# Patient Record
Sex: Female | Born: 1945
Health system: Southern US, Community
[De-identification: ages and names within clinical notes are randomized; demographics above are authoritative.]

## PROBLEM LIST (undated history)

## (undated) DIAGNOSIS — I272 Pulmonary hypertension, unspecified: Secondary | ICD-10-CM

## (undated) DIAGNOSIS — I2510 Atherosclerotic heart disease of native coronary artery without angina pectoris: Secondary | ICD-10-CM

## (undated) DIAGNOSIS — I350 Nonrheumatic aortic (valve) stenosis: Secondary | ICD-10-CM

## (undated) DIAGNOSIS — I10 Essential (primary) hypertension: Secondary | ICD-10-CM

## (undated) DIAGNOSIS — Z8619 Personal history of other infectious and parasitic diseases: Secondary | ICD-10-CM

## (undated) DIAGNOSIS — J45909 Unspecified asthma, uncomplicated: Secondary | ICD-10-CM

## (undated) DIAGNOSIS — I428 Other cardiomyopathies: Secondary | ICD-10-CM

## (undated) DIAGNOSIS — E119 Type 2 diabetes mellitus without complications: Secondary | ICD-10-CM

## (undated) HISTORY — PX: OTHER SURGICAL HISTORY: SHX169

## (undated) HISTORY — PX: TUBAL LIGATION: SHX77

## (undated) HISTORY — DX: Other cardiomyopathies: I42.8

## (undated) HISTORY — DX: Nonrheumatic aortic (valve) stenosis: I35.0

## (undated) HISTORY — DX: Personal history of other infectious and parasitic diseases: Z86.19

## (undated) HISTORY — DX: Unspecified asthma, uncomplicated: J45.909

## (undated) HISTORY — DX: Atherosclerotic heart disease of native coronary artery without angina pectoris: I25.10

## (undated) HISTORY — DX: Type 2 diabetes mellitus without complications: E11.9

## (undated) HISTORY — DX: Pulmonary hypertension, unspecified: I27.20

## (undated) HISTORY — PX: CARDIAC CATHETERIZATION: SHX172

## (undated) HISTORY — DX: Essential (primary) hypertension: I10

---

## 2000-08-31 DIAGNOSIS — I10 Essential (primary) hypertension: Secondary | ICD-10-CM | POA: Insufficient documentation

## 2000-08-31 DIAGNOSIS — J4520 Mild intermittent asthma, uncomplicated: Secondary | ICD-10-CM | POA: Insufficient documentation

## 2002-09-22 DIAGNOSIS — Z6841 Body Mass Index (BMI) 40.0 and over, adult: Secondary | ICD-10-CM | POA: Insufficient documentation

## 2003-08-11 DIAGNOSIS — R609 Edema, unspecified: Secondary | ICD-10-CM | POA: Insufficient documentation

## 2004-02-04 ENCOUNTER — Ambulatory Visit: Payer: Self-pay | Admitting: Family Medicine

## 2006-04-02 DIAGNOSIS — E785 Hyperlipidemia, unspecified: Secondary | ICD-10-CM | POA: Insufficient documentation

## 2008-01-03 DIAGNOSIS — L304 Erythema intertrigo: Secondary | ICD-10-CM | POA: Insufficient documentation

## 2012-04-08 ENCOUNTER — Ambulatory Visit: Payer: Self-pay | Admitting: Family Medicine

## 2012-07-09 ENCOUNTER — Ambulatory Visit: Payer: Self-pay | Admitting: Gastroenterology

## 2012-07-09 LAB — HM COLONOSCOPY

## 2013-10-02 DEATH — deceased

## 2014-03-06 DIAGNOSIS — I10 Essential (primary) hypertension: Secondary | ICD-10-CM | POA: Diagnosis not present

## 2014-03-06 DIAGNOSIS — E559 Vitamin D deficiency, unspecified: Secondary | ICD-10-CM | POA: Diagnosis not present

## 2014-03-06 DIAGNOSIS — E785 Hyperlipidemia, unspecified: Secondary | ICD-10-CM | POA: Diagnosis not present

## 2014-03-06 DIAGNOSIS — E119 Type 2 diabetes mellitus without complications: Secondary | ICD-10-CM | POA: Diagnosis not present

## 2014-03-06 DIAGNOSIS — N390 Urinary tract infection, site not specified: Secondary | ICD-10-CM | POA: Diagnosis not present

## 2014-03-06 LAB — LIPID PANEL
CHOLESTEROL: 229 mg/dL — AB (ref 0–200)
LDL Cholesterol: 132 mg/dL
TRIGLYCERIDES: 77 mg/dL (ref 40–160)

## 2014-03-06 LAB — BASIC METABOLIC PANEL
BUN: 19 mg/dL (ref 4–21)
Creatinine: 0.8 mg/dL (ref <=1.1)
Glucose: 121 mg/dL
Potassium: 3.7 mmol/L (ref 3.4–5.3)
Sodium: 139 mmol/L (ref 137–147)

## 2014-03-06 LAB — HEMOGLOBIN A1C: Hgb A1c MFr Bld: 6.9 % — AB (ref 4.0–6.0)

## 2014-03-18 LAB — LIPID PANEL: HDL: 82 mg/dL — AB (ref 35–70)

## 2014-06-30 DIAGNOSIS — M8580 Other specified disorders of bone density and structure, unspecified site: Secondary | ICD-10-CM | POA: Insufficient documentation

## 2014-06-30 DIAGNOSIS — L723 Sebaceous cyst: Secondary | ICD-10-CM

## 2014-06-30 DIAGNOSIS — K648 Other hemorrhoids: Secondary | ICD-10-CM | POA: Insufficient documentation

## 2014-06-30 DIAGNOSIS — G4700 Insomnia, unspecified: Secondary | ICD-10-CM | POA: Insufficient documentation

## 2014-06-30 DIAGNOSIS — L089 Local infection of the skin and subcutaneous tissue, unspecified: Secondary | ICD-10-CM | POA: Insufficient documentation

## 2014-06-30 DIAGNOSIS — K120 Recurrent oral aphthae: Secondary | ICD-10-CM | POA: Insufficient documentation

## 2014-06-30 DIAGNOSIS — R195 Other fecal abnormalities: Secondary | ICD-10-CM | POA: Insufficient documentation

## 2014-07-01 ENCOUNTER — Encounter: Payer: Self-pay | Admitting: Family Medicine

## 2014-07-01 ENCOUNTER — Ambulatory Visit (INDEPENDENT_AMBULATORY_CARE_PROVIDER_SITE_OTHER): Payer: Medicare Other | Admitting: Family Medicine

## 2014-07-01 VITALS — BP 150/92 | HR 84 | Temp 97.7°F | Resp 16 | Ht 60.75 in | Wt 223.0 lb

## 2014-07-01 DIAGNOSIS — R609 Edema, unspecified: Secondary | ICD-10-CM | POA: Diagnosis not present

## 2014-07-01 DIAGNOSIS — I10 Essential (primary) hypertension: Secondary | ICD-10-CM | POA: Diagnosis not present

## 2014-07-01 DIAGNOSIS — E559 Vitamin D deficiency, unspecified: Secondary | ICD-10-CM | POA: Diagnosis not present

## 2014-07-01 DIAGNOSIS — R350 Frequency of micturition: Secondary | ICD-10-CM | POA: Diagnosis not present

## 2014-07-01 DIAGNOSIS — IMO0002 Reserved for concepts with insufficient information to code with codable children: Secondary | ICD-10-CM

## 2014-07-01 DIAGNOSIS — E785 Hyperlipidemia, unspecified: Secondary | ICD-10-CM

## 2014-07-01 DIAGNOSIS — Z0000 Encounter for general adult medical examination without abnormal findings: Secondary | ICD-10-CM | POA: Diagnosis not present

## 2014-07-01 DIAGNOSIS — J4520 Mild intermittent asthma, uncomplicated: Secondary | ICD-10-CM | POA: Diagnosis not present

## 2014-07-01 DIAGNOSIS — E1165 Type 2 diabetes mellitus with hyperglycemia: Secondary | ICD-10-CM | POA: Diagnosis not present

## 2014-07-01 DIAGNOSIS — N390 Urinary tract infection, site not specified: Secondary | ICD-10-CM

## 2014-07-01 DIAGNOSIS — L729 Follicular cyst of the skin and subcutaneous tissue, unspecified: Secondary | ICD-10-CM

## 2014-07-01 LAB — POCT UA - MICROALBUMIN: Microalbumin Ur, POC: 100 mg/L

## 2014-07-01 LAB — POCT URINALYSIS DIPSTICK
Bilirubin, UA: NEGATIVE
GLUCOSE UA: NEGATIVE
KETONES UA: NEGATIVE
NITRITE UA: NEGATIVE
PH UA: 6
Spec Grav, UA: 1.02
Urobilinogen, UA: 0.2

## 2014-07-01 MED ORDER — CIPROFLOXACIN HCL 500 MG PO TABS: 500.0000 mg | ORAL_TABLET | Freq: Two times a day (BID) | ORAL | Status: AC

## 2014-07-01 MED ORDER — FLUTICASONE PROPIONATE HFA 44 MCG/ACT IN AERO: 2.0000 | INHALATION_SPRAY | Freq: Two times a day (BID) | RESPIRATORY_TRACT | Status: DC

## 2014-07-01 MED ORDER — LISINOPRIL 20 MG PO TABS: 20.0000 mg | ORAL_TABLET | Freq: Every day | ORAL | Status: DC

## 2014-07-01 MED ORDER — LISINOPRIL 10 MG PO TABS: 10.0000 mg | ORAL_TABLET | Freq: Every day | ORAL | Status: DC

## 2014-07-01 NOTE — Patient Instructions (Addendum)
   Try OTC Biotene for dry mouth  Call if cyst is not much better in 1 week.  Start Flovent HFA inhaler, two puffs twice a day. You can still use rescue inhaler as needed, if you get short of breath  Increase lisinopril to 20mg  a day. A [facility] [address]

## 2014-07-01 NOTE — Progress Notes (Signed)
Patient: [name], Female    DOB: 02/13/1945, 69 y.o.   MRN: [id number] Visit Date: 07/01/2014  Today's Provider: Lelon Huh, MD   Chief Complaint  Patient presents with  . Medicare Wellness  . Hypertension    follow up  . Diabetes    follow up  . Hyperlipidemia   Subjective:    Annual exam visit [name] is a 69 y.o. female who presents today for her yearly physical  She feels fairly well. She reports exercising never. She reports she is sleeping poorly.  -----------------------------------------------------------  Diabetes Mellitus Type II, Follow-up:   Lab Results  Component Value Date   HGBA1C 6.9* 03/06/2014   Last seen for diabetes 3 months ago.  Management changes included none. She reports good compliance with treatment. She is not having side effects.  Current symptoms include polyuria and have been stable. Home blood sugar records: not being checked  Episodes of hypoglycemia? no   Current Insulin Regimen: none Most Recent Eye Exam: 1 year ago Weight trend: stable Prior visit with dietician: no Current diet: in general, an "unhealthy" diet Current exercise: none  ------------------------------------------------------------------------   Hypertension, follow-up:  BP Readings from Last 3 Encounters:  07/01/14 162/92  03/06/14 176/98    She was last seen for hypertension 3 months ago.  BP at that visit was 176/98. Management changes since that visit include doubling lisinopril to 2 x 5mg  tablets daily. She reports good compliance with treatment. She is not having side effects.  She is not exercising. She is not adherent to low salt diet.   Outside blood pressures are not being checked. She is experiencing none.  Patient denies chest pain, chest pressure/discomfort, claudication, dyspnea, exertional chest pressure/discomfort, fatigue, irregular heart beat, lower extremity edema, near-syncope, orthopnea, palpitations, paroxysmal  nocturnal dyspnea, syncope and tachypnea.   Cardiovascular risk factors include advanced age (older than 46 for men, 70 for women), diabetes mellitus, dyslipidemia, hypertension and obesity (BMI >= 30 kg/m2).  Use of agents associated with hypertension: none.   ------------------------------------------------------------------------    Lipid/Cholesterol, Follow-up:   Last seen for this 3 months ago.  Management changes since that visit include increasing Lovastatin to 20mg  daily.  Last Lipid Panel:    Component Value Date/Time   CHOL 229* 03/06/2014   TRIG 77 03/06/2014   HDL 82* 03/18/2014   LDLCALC 132 03/06/2014    She reports good compliance with treatment. She is not having side effects.   Wt Readings from Last 3 Encounters:  07/01/14 223 lb (101.152 kg)  03/06/14 216 lb (97.977 kg)    ------------------------------------------------------------------------  Vitamin D Deficency: Last office visit  was 03-04/2014. Changes made during this visit includes starting OTC Vitamin d3 1,000units daily. Patient  Has been taking Vitamin D since her last visit.   Asthma She states her breathing has been much worse the last several weeks, related to weather changes. Is having to use her rescue inhaler several times a day.   Cysts: She states she has had a tender knot on her buttocks for the several weeks and is slowly getting worse. No know injury. No drainage.     Review of Systems  Respiratory: Positive for shortness of breath and wheezing.   Gastrointestinal: Positive for nausea.  Genitourinary: Positive for frequency.  Musculoskeletal: Positive for back pain and arthralgias.  Skin: Positive for rash (lunp on right buttock).  Neurological: Positive for dizziness and headaches.  All other systems reviewed and are negative.  History   Social History  . Marital Status: Married    Spouse Name: N/A  . Number of Children: N/A  . Years of Education: N/A    Occupational History  . Not on file.   Social History Main Topics  . Smoking status: Never Smoker   . Smokeless tobacco: Not on file  . Alcohol Use: Yes     Comment: Occasional alcohol use  . Drug Use: No  . Sexual Activity: Not on file   Other Topics Concern  . Not on file   Social History Narrative    Patient Active Problem List   Diagnosis Date Noted  . Aphthae 06/30/2014  . Insomnia 06/30/2014  . Bleeding internal hemorrhoids 06/30/2014  . Dyssomnia 06/30/2014  . Osteopenia 06/30/2014  . Infected sebaceous cyst 06/30/2014  . Fecal occult blood test positive 06/30/2014  . Vitamin D deficiency 04/29/2009  . Diabetes mellitus type 2, uncontrolled 01/28/2009  . Eczema intertrigo 01/03/2008  . HLD (hyperlipidemia) 04/02/2006  . Accumulation of fluid in tissues 08/11/2003  . BMI 40.0-44.9, adult 09/22/2002  . Airway hyperreactivity 08/31/2000  . Essential (primary) hypertension 08/31/2000  . Leg varices 04/18/1999    Past Surgical History  Procedure Laterality Date  . Tubal ligation    . Cyst(solitary) of breast:removed      Her family history includes Cancer in her mother and sister; Diabetes in her brother, mother, and sister; Heart disease in her mother; Hypertension in her sister; Parkinson's disease in her father.    Previous Medications   ALBUTEROL (PROAIR HFA) 108 (90 BASE) MCG/ACT INHALER    Inhale into the lungs.   CALCIUM-VITAMIN D (OSCAL WITH D) 250-125 MG-UNIT PER TABLET    Take by mouth.   CHOLECALCIFEROL (VITAMIN D) 1000 UNITS TABLET    Take 1,000 Units by mouth daily.   CINNAMON 500 MG TABS    Take by mouth.   DOCUSATE SODIUM (STOOL SOFTENER) 100 MG CAPSULE    STOOL SOFTENER, 100MG  (Oral Tablet)  1 Every Day for 0 days  Quantity: 0.00;  Refills: 0   Ordered :06-Nov-2009  Meyer Cory ;  Started 29-Apr-2009 Active   FLUTICASONE-SALMETEROL (ADVAIR DISKUS) 250-50 MCG/DOSE AEPB    Inhale into the lungs.   KETOCONAZOLE (NIZORAL) 2 % CREAM     KETOCONAZOLE, 2% (External Cream)  1 (one) application Apply to affected area twice daily, as needed for 0 days  Quantity: 45;  Refills: 1   Ordered :09-May-2013  Sharen Hones ;  Started 09-May-2013 Active Comments: Medication taken as needed. DX: 695.89   LISINOPRIL (PRINIVIL,ZESTRIL) 5 MG TABLET    Take 2 by mouth every day   LOVASTATIN (MEVACOR) 20 MG TABLET    Take by mouth.   METFORMIN (GLUCOPHAGE-XR) 750 MG 24 HR TABLET    Take by mouth.   NAPROXEN SODIUM (ALEVE) 220 MG TABLET    Take by mouth.   NIFEDIPINE (PROCARDIA-XL/ADALAT CC) 30 MG 24 HR TABLET    Take by mouth.   TRAMADOL (ULTRAM) 50 MG TABLET    Take 50 mg by mouth every 8 (eight) hours as needed.   TRAZODONE (DESYREL) 100 MG TABLET    Take 100 mg by mouth at bedtime as needed.    Patient Care Team: Birdie Sons, MD as PCP - General (Family Medicine)     Objective:   Vitals: BP 162/92 mmHg  Pulse 84  Temp(Src) 97.7 F (36.5 C) (Oral)  Resp 16  Ht 5' 0.75" (1.543 m)  Wt 223  lb (101.152 kg)  BMI 42.49 kg/m2  SpO2 96%  Physical Exam   General Appearance:    Alert, cooperative, no distress, appears stated age, morbidly obese  Head:    Normocephalic, without obvious abnormality, atraumatic  Eyes:    PERRL, conjunctiva/corneas clear, EOM's intact, fundi    benign, both eyes  Ears:    Normal TM's and external ear canals, both ears  Nose:   Nares normal, septum midline, mucosa normal, no drainage    or sinus tenderness  Throat:   Lips, mucosa, and tongue normal; teeth and gums normal  Neck:   Supple, symmetrical, trachea midline, no adenopathy;    thyroid:  no enlargement/tenderness/nodules; no carotid   bruit or JVD  Back:     Symmetric, no curvature, ROM normal, no CVA tenderness  Lungs:     Clear to auscultation bilaterally, occasional mild expiratory wheeze, respirations unlabored  Chest Wall:    No tenderness or deformity   Heart:    Regular rate and rhythm, S1 and S2 normal, no murmur, rub   or gallop    Breast Exam:    normal appearance, no masses or tenderness, deferred  Abdomen:     Soft, non-tender, bowel sounds active all four quadrants,    no masses, no organomegaly  Pevic:    deferred  Extremities:   Extremities normal, atraumatic, no cyanosis 2+ bipedal edema  Pulses:   2+ and symmetric all extremities  Skin:   Skin color, texture, turgor normal, no rashes. Grape sized tender cyst   Lymph nodes:   Cervical, supraclavicular, and axillary nodes normal  Neurologic:   CNII-XII intact, normal strength, sensation and reflexes    throughout    Activities of Daily Living In your present state of health, do you have any difficulty performing the following activities: 07/01/2014  Hearing? N  Vision? N  Difficulty concentrating or making decisions? Y  Walking or climbing stairs? Y  Dressing or bathing? N  Doing errands, shopping? Y    Fall Risk Assessment Fall Risk  07/01/2014  Falls in the past year? Yes  Number falls in past yr: 1  Injury with Fall? No  Follow up Falls evaluation completed     Depression Screen PHQ 2/9 Scores 07/01/2014  PHQ - 2 Score 2  PHQ- 9 Score 3    Cognitive Testing - 6-CIT  Correct? Score   What year is it? yes 0 0 or 4  What month is it? yes 0 0 or 3  Memorize:    [place], [place],  [birth year],  [place],      What time is it? (within 1 hour) yes 0 0 or 3  Count backwards from 20 yes 0 0, 2, or 4  Name the months of the year yes 0 0, 2, or 4  Repeat name & address above no 8 0, 2, 4, 6, 8, or 10       TOTAL SCORE  8/28   Interpretation:  Abnormal- *  Normal (0-7) Abnormal (8-28)    Audit-C Alcohol Use Screening  Question Answer Points  How often do you have alcoholic drink? never 0  How many drinks do you typically consume in a day? 0 0  How oftey will you drink 6 or more in a total? never 0  Total Score:  0   A score of 3 or more in women, and 4 or more in men indicates increased risk for alcohol abuse, EXCEPT if all of  the points are  from question 1.     Assessment & Plan:     Annual Wellness Visit  Reviewed patient's Family Medical History Reviewed and updated list of patient's medical providers Assessment of cognitive impairment was done Assessed patient's functional ability Established a written schedule for health screening [place] Completed and Reviewed  Exercise Activities and Dietary recommendations Goals    None      Immunization History  Administered Date(s) Administered  . Pneumococcal Conjugate-13 05/09/2013  . Pneumococcal Polysaccharide-23 01/19/2011    Health Maintenance  Topic Date Due  . FOOT EXAM  05/12/1954  . OPHTHALMOLOGY EXAM  05/12/1954  . URINE MICROALBUMIN  05/12/1954  . TETANUS/TDAP  05/12/1963  . MAMMOGRAM  05/12/1994  . ZOSTAVAX  05/11/2004  . DEXA SCAN  05/11/2009  . INFLUENZA VACCINE  08/03/2014  . HEMOGLOBIN A1C  09/06/2014  . COLONOSCOPY  07/10/2022  . PNA vac Low Risk Adult  Completed      Discussed health benefits of physical activity, and encouraged her to engage in regular exercise appropriate for her age and condition.    ------------------------------------------------------------------------------------------------------------  1. Annual physical exam  - DG Bone Density; Future  2. Diabetes mellitus type 2, with nephropathy Due for A1c.  - Hemoglobin A1c - EKG 12-Lead - POCT UA - Microalbumin  3. Edema   4. Essential (primary) hypertension Improved since increasing lisinopril to 2 x 5mg , but not to goal.  - Increase lisinopril to 20mg  a day.  - Renal function panel  5. Vitamin D deficiency Tolerating D supplements - Vit D  25 hydroxy (rtn osteoporosis monitoring)  6. Frequent urination  - POCT urinalysis dipstick  7. Urinary tract infection without hematuria, site unspecified  - Urine culture - Cipro 500 BID for 7 dats.,   8. HLD (hyperlipidemia) Check lipids, on statin - Hepatic function panel - Lipid  panel - TSH  9. Cyst of buttocks Call if not improved after a week on cipro.   10. Uncontrolled asthma Start Fluticasone HFA 44, two puffs twice a day.   Follow u pin 1 month

## 2014-07-02 LAB — LIPID PANEL
CHOL/HDL RATIO: 2.4 ratio (ref 0.0–4.4)
CHOLESTEROL TOTAL: 193 mg/dL (ref 100–199)
HDL: 81 mg/dL (ref >39)
LDL CALC: 99 mg/dL (ref 0–99)
TRIGLYCERIDES: 64 mg/dL (ref 0–149)
VLDL Cholesterol Cal: 13 mg/dL (ref 5–40)

## 2014-07-02 LAB — RENAL FUNCTION PANEL
ALBUMIN: 4.1 g/dL (ref 3.5–4.8)
BUN/Creatinine Ratio: 22 (ref 11–26)
BUN: 18 mg/dL (ref 8–27)
CHLORIDE: 99 mmol/L (ref 97–108)
CO2: 26 mmol/L (ref 18–29)
Calcium: 10.4 mg/dL — ABNORMAL HIGH (ref 8.7–10.3)
Creatinine, Ser: 0.83 mg/dL (ref 0.57–1.00)
GFR calc Af Amer: 83 mL/min/{1.73_m2} (ref >59)
GFR calc non Af Amer: 72 mL/min/{1.73_m2} (ref >59)
GLUCOSE: 115 mg/dL — AB (ref 65–99)
PHOSPHORUS: 4.1 mg/dL (ref 2.5–4.5)
Potassium: 4.3 mmol/L (ref 3.5–5.2)
Sodium: 141 mmol/L (ref 134–144)

## 2014-07-02 LAB — HEPATIC FUNCTION PANEL
ALT: 12 IU/L (ref 0–32)
AST: 19 IU/L (ref 0–40)
Alkaline Phosphatase: 73 IU/L (ref 39–117)
BILIRUBIN, DIRECT: 0.11 mg/dL (ref 0.00–0.40)
Bilirubin Total: 0.4 mg/dL (ref 0.0–1.2)
Total Protein: 6.9 g/dL (ref 6.0–8.5)

## 2014-07-02 LAB — HEMOGLOBIN A1C
Est. average glucose Bld gHb Est-mCnc: 157 mg/dL
Hgb A1c MFr Bld: 7.1 % — ABNORMAL HIGH (ref 4.8–5.6)

## 2014-07-02 LAB — VITAMIN D 25 HYDROXY (VIT D DEFICIENCY, FRACTURES): VIT D 25 HYDROXY: 28 ng/mL — AB (ref 30.0–100.0)

## 2014-07-02 LAB — TSH: TSH: 0.673 u[IU]/mL (ref 0.450–4.500)

## 2014-07-03 ENCOUNTER — Telehealth: Payer: Self-pay | Admitting: Family Medicine

## 2014-07-03 LAB — URINE CULTURE

## 2014-07-03 NOTE — Telephone Encounter (Signed)
Pt has a mammogram appt ,July 6th. She is getting a bone density test that same day and needs an order sent over.   Thanks tp

## 2014-07-03 NOTE — Telephone Encounter (Signed)
Order sent to [facility].

## 2014-07-07 ENCOUNTER — Telehealth: Payer: Self-pay

## 2014-07-07 DIAGNOSIS — L723 Sebaceous cyst: Principal | ICD-10-CM

## 2014-07-07 DIAGNOSIS — L089 Local infection of the skin and subcutaneous tissue, unspecified: Secondary | ICD-10-CM

## 2014-07-07 MED ORDER — DOXYCYCLINE HYCLATE 100 MG PO TABS: 100.0000 mg | ORAL_TABLET | Freq: Two times a day (BID) | ORAL | Status: DC

## 2014-07-07 NOTE — Telephone Encounter (Signed)
-----   Message from Margarita Rana, MD sent at 07/03/2014 11:42 AM EDT ----- No infection.  Please see how patient is doing.  Thanks.

## 2014-07-07 NOTE — Telephone Encounter (Signed)
Sent in new rx.  Please notify patient. Thanks.

## 2014-07-07 NOTE — Telephone Encounter (Signed)
Pt advised as directed below.  She says that she still had a cyst on her buttocks and would like to try a different antibiotic.  She does have a follow also with Dr. Caryn Section 07/31/2014.  Thanks,   -Mickel Baas

## 2014-07-08 ENCOUNTER — Encounter: Payer: Self-pay | Admitting: Family Medicine

## 2014-07-08 LAB — HM MAMMOGRAPHY

## 2014-07-08 NOTE — Telephone Encounter (Signed)
Tried calling pt, no answer or VM. Will try again later. Renaldo Fiddler, CMA

## 2014-07-09 ENCOUNTER — Ambulatory Visit: Payer: Self-pay

## 2014-07-13 ENCOUNTER — Encounter: Payer: Self-pay | Admitting: *Deleted

## 2014-07-15 ENCOUNTER — Encounter: Payer: Self-pay | Admitting: Family Medicine

## 2014-07-31 ENCOUNTER — Ambulatory Visit (INDEPENDENT_AMBULATORY_CARE_PROVIDER_SITE_OTHER): Payer: Medicare Other | Admitting: Family Medicine

## 2014-07-31 ENCOUNTER — Encounter: Payer: Self-pay | Admitting: Family Medicine

## 2014-07-31 VITALS — BP 144/100 | HR 83 | Temp 98.0°F | Resp 16 | Ht 60.75 in | Wt 220.0 lb

## 2014-07-31 DIAGNOSIS — I10 Essential (primary) hypertension: Secondary | ICD-10-CM | POA: Diagnosis not present

## 2014-07-31 DIAGNOSIS — G4700 Insomnia, unspecified: Secondary | ICD-10-CM

## 2014-07-31 DIAGNOSIS — M1990 Unspecified osteoarthritis, unspecified site: Secondary | ICD-10-CM | POA: Diagnosis not present

## 2014-07-31 DIAGNOSIS — L723 Sebaceous cyst: Secondary | ICD-10-CM | POA: Diagnosis not present

## 2014-07-31 DIAGNOSIS — L089 Local infection of the skin and subcutaneous tissue, unspecified: Secondary | ICD-10-CM

## 2014-07-31 DIAGNOSIS — J4520 Mild intermittent asthma, uncomplicated: Secondary | ICD-10-CM | POA: Diagnosis not present

## 2014-07-31 MED ORDER — DOXYCYCLINE HYCLATE 100 MG PO CAPS: 100.0000 mg | ORAL_CAPSULE | Freq: Two times a day (BID) | ORAL | Status: DC

## 2014-07-31 MED ORDER — TRAZODONE HCL 100 MG PO TABS: 100.0000 mg | ORAL_TABLET | Freq: Every evening | ORAL | Status: DC | PRN

## 2014-07-31 MED ORDER — TRAMADOL HCL 50 MG PO TABS: 50.0000 mg | ORAL_TABLET | Freq: Three times a day (TID) | ORAL | Status: DC | PRN

## 2014-07-31 MED ORDER — LISINOPRIL 10 MG PO TABS: 10.0000 mg | ORAL_TABLET | Freq: Every day | ORAL | Status: DC

## 2014-07-31 NOTE — Progress Notes (Signed)
Patient: [name] Female    DOB: Sep 03, 1945   69 y.o.   MRN: [id number] Visit Date: 07/31/2014  Today's Provider: Lelon Huh, MD   Chief Complaint  Patient presents with  . Follow-up    1 month  . Hypertension  . Asthma   Subjective:    HPI  Follow-up for uncontrolled asthma, changes included: Started Fluticasone HFA 44, two puffs twice a day   Hypertension, follow-up:  BP Readings from Last 3 Encounters:  07/31/14 144/100  07/01/14 150/92  03/06/14 176/98    She was last seen for hypertension 1 months ago.  BP at that visit was 162/92. Management changes since that visit include Increased lisinopril to 20mg  a day. She reports fair compliance with treatment. She is not having side effects. none She is not exercising. She is adherent to low salt diet.   Outside blood pressures are N/A. She is experiencing chest pressure/discomfort.  Patient denies none.      Weight trend: stable Wt Readings from Last 3 Encounters:  07/31/14 220 lb (99.791 kg)  07/01/14 223 lb (101.152 kg)  03/06/14 216 lb (97.977 kg)    Current diet: not asked  ------------------------------------------------------------------------   Asthma Follow up Patient has started using Advair every day and states her breathing is much better and only  Requires her rescue inhaler a few times per week. She would like to continue current Advair dosage.    Follow up pain medications She states tramadol is working well and takes most day. Having no adverse effects, but needs refill.    Insomnia Doing well with trazodone and requests refill. No adverse effects. Takes every night  Allergies  Allergen Reactions  . Hydrochlorothiazide     Leg Cramps  . Penicillins Rash   Previous Medications   ALBUTEROL (PROAIR HFA) 108 (90 BASE) MCG/ACT INHALER    Inhale into the lungs.   CALCIUM-VITAMIN D (OSCAL WITH D) 250-125 MG-UNIT PER TABLET    Take by mouth.   CHOLECALCIFEROL (VITAMIN D)  1000 UNITS TABLET    Take 1,000 Units by mouth daily.   CINNAMON 500 MG TABS    Take by mouth.   DOCUSATE SODIUM (STOOL SOFTENER) 100 MG CAPSULE    STOOL SOFTENER, 100MG  (Oral Tablet)  1 Every Day for 0 days  Quantity: 0.00;  Refills: 0   Ordered :06-Nov-2009  Meyer Cory ;  Started 29-Apr-2009 Active   FLUTICASONE (FLOVENT HFA) 44 MCG/ACT INHALER    Inhale 2 puffs into the lungs 2 (two) times daily.   FLUTICASONE-SALMETEROL (ADVAIR DISKUS) 250-50 MCG/DOSE AEPB    Inhale into the lungs.   KETOCONAZOLE (NIZORAL) 2 % CREAM    KETOCONAZOLE, 2% (External Cream)  1 (one) application Apply to affected area twice daily, as needed for 0 days  Quantity: 45;  Refills: 1   Ordered :09-May-2013  Sharen Hones ;  Started 09-May-2013 Active Comments: Medication taken as needed. DX: 695.89   LOVASTATIN (MEVACOR) 20 MG TABLET    Take by mouth.   METFORMIN (GLUCOPHAGE-XR) 750 MG 24 HR TABLET    Take by mouth.   NAPROXEN SODIUM (ALEVE) 220 MG TABLET    Take by mouth.   NIFEDIPINE (PROCARDIA-XL/ADALAT CC) 30 MG 24 HR TABLET    Take by mouth.    Review of Systems  Constitutional: Positive for fatigue.  Respiratory: Positive for chest tightness. Negative for shortness of breath and wheezing.   Cardiovascular: Positive for chest pain. Negative for palpitations.  Musculoskeletal: Positive for gait problem, neck pain and neck stiffness.  Neurological: Negative for dizziness, light-headedness and headaches.  Psychiatric/Behavioral: Positive for sleep disturbance.    History  Substance Use Topics  . Smoking status: Never Smoker   . Smokeless tobacco: Not on file  . Alcohol Use: Yes     Comment: Occasional alcohol use   Objective:   BP 144/100 mmHg  Pulse 83  Temp(Src) 98 F (36.7 C) (Oral)  Resp 16  Ht 5' 0.75" (1.543 m)  Wt 220 lb (99.791 kg)  BMI 41.91 kg/m2  SpO2 100%  Physical Exam   General Appearance:    Alert, cooperative, no distress, obses  Eyes:    PERRL,  conjunctiva/corneas clear, EOM's intact       Lungs:     Clear to auscultation bilaterally, respirations unlabored  Heart:    Regular rate and rhythm  Neurologic:   Awake, alert, oriented x 3. No apparent focal neurological           defect.            Assessment & Plan:     1. Infected sebaceous cyst She states this improved quite a bit with doxy, but did not completely resolve. Will get back on abx.  - doxycycline (VIBRAMYCIN) 100 MG capsule; Take 1 capsule (100 mg total) by mouth 2 (two) times daily.  Dispense: 20 capsule; Refill: 0  2. Essential (primary) hypertension Improved with higher dose of lisinopril. Not quite to goal, work on diet and exercise.  - lisinopril (PRINIVIL,ZESTRIL) 10 MG tablet; Take 1 tablet (10 mg total) by mouth daily.  Dispense: 30 tablet; Refill: 5  3. Insomnia Doing well with trazodone - traZODone (DESYREL) 100 MG tablet; Take 1 tablet (100 mg total) by mouth at bedtime as needed.  Dispense: 30 tablet; Refill: 5  4. Intermittent asthma, uncontrolled, uncomplicated Greatly improved with Advair, she states she will call for refill before samples run out.   5. Osteoarthritis, unspecified osteoarthritis type, unspecified site Doing well with tramadol.  - traMADol (ULTRAM) 50 MG tablet; Take 1 tablet (50 mg total) by mouth every 8 (eight) hours as needed.  Dispense: 30 tablet; Refill: Tucker, MD  [facility]

## 2014-08-10 ENCOUNTER — Other Ambulatory Visit: Payer: Self-pay | Admitting: Family Medicine

## 2014-08-13 ENCOUNTER — Encounter: Payer: Self-pay | Admitting: Obstetrics & Gynecology

## 2014-08-13 ENCOUNTER — Ambulatory Visit: Payer: Medicare Other | Admitting: Obstetrics & Gynecology

## 2014-08-13 VITALS — BP 170/92

## 2014-08-13 DIAGNOSIS — M8580 Other specified disorders of bone density and structure, unspecified site: Secondary | ICD-10-CM

## 2014-08-13 NOTE — Progress Notes (Signed)
   Subjective:    Patient ID: [name], female    DOB: 06/29/1945, 69 y.o.   MRN: [id number]  HPI  69 yo lady here for DEXA results. Dr. Alm Bustard is her primary care doctor and he ordered this test.  Review of Systems     Objective:   Physical Exam        Assessment & Plan:  Since she has an established relationship and he order the test, she will follow up with him.

## 2014-09-04 ENCOUNTER — Other Ambulatory Visit: Payer: Self-pay | Admitting: Family Medicine

## 2014-10-06 ENCOUNTER — Other Ambulatory Visit: Payer: Self-pay | Admitting: *Deleted

## 2014-10-06 ENCOUNTER — Encounter: Payer: Self-pay | Admitting: Family Medicine

## 2014-10-06 DIAGNOSIS — I10 Essential (primary) hypertension: Secondary | ICD-10-CM

## 2014-10-06 MED ORDER — LOVASTATIN 20 MG PO TABS: 20.0000 mg | ORAL_TABLET | Freq: Every day | ORAL | Status: DC

## 2014-10-06 MED ORDER — LISINOPRIL 10 MG PO TABS: 10.0000 mg | ORAL_TABLET | Freq: Every day | ORAL | Status: DC

## 2014-10-06 NOTE — Telephone Encounter (Signed)
[facility]

## 2014-11-02 ENCOUNTER — Encounter: Payer: Self-pay | Admitting: Family Medicine

## 2014-11-02 ENCOUNTER — Ambulatory Visit (INDEPENDENT_AMBULATORY_CARE_PROVIDER_SITE_OTHER): Payer: Medicare Other | Admitting: Family Medicine

## 2014-11-02 VITALS — BP 180/110 | HR 76 | Temp 97.7°F | Resp 18 | Wt 216.0 lb

## 2014-11-02 DIAGNOSIS — S161XXA Strain of muscle, fascia and tendon at neck level, initial encounter: Secondary | ICD-10-CM | POA: Diagnosis not present

## 2014-11-02 DIAGNOSIS — E1121 Type 2 diabetes mellitus with diabetic nephropathy: Secondary | ICD-10-CM

## 2014-11-02 DIAGNOSIS — J4520 Mild intermittent asthma, uncomplicated: Secondary | ICD-10-CM

## 2014-11-02 DIAGNOSIS — N3000 Acute cystitis without hematuria: Secondary | ICD-10-CM | POA: Diagnosis not present

## 2014-11-02 DIAGNOSIS — I10 Essential (primary) hypertension: Secondary | ICD-10-CM

## 2014-11-02 LAB — POCT URINALYSIS DIPSTICK
BILIRUBIN UA: NEGATIVE
Blood, UA: NEGATIVE
GLUCOSE UA: NEGATIVE
KETONES UA: NEGATIVE
Leukocytes, UA: NEGATIVE
NITRITE UA: POSITIVE
PH UA: 6
SPEC GRAV UA: 1.025
Urobilinogen, UA: 0.2

## 2014-11-02 LAB — POCT GLYCOSYLATED HEMOGLOBIN (HGB A1C)
ESTIMATED AVERAGE GLUCOSE: 154
HEMOGLOBIN A1C: 7

## 2014-11-02 MED ORDER — FLUTICASONE PROPIONATE HFA 220 MCG/ACT IN AERO: 2.0000 | INHALATION_SPRAY | Freq: Two times a day (BID) | RESPIRATORY_TRACT | Status: DC

## 2014-11-02 MED ORDER — LISINOPRIL 20 MG PO TABS: 20.0000 mg | ORAL_TABLET | Freq: Every day | ORAL | Status: DC

## 2014-11-02 MED ORDER — CIPROFLOXACIN HCL 500 MG PO TABS: 500.0000 mg | ORAL_TABLET | Freq: Two times a day (BID) | ORAL | Status: AC

## 2014-11-02 NOTE — Progress Notes (Signed)
Patient: [name] Female    DOB: 03-03-1945   69 y.o.   MRN: [id number] Visit Date: 11/02/2014  Today's Provider: Lelon Huh, MD   Chief Complaint  Patient presents with  . Follow-up asthma  . Hypertension   Frequent urination   Neck pain  . Diabetes   Subjective:    HPI   Asthma Follow up: Last office visit was 3 months ago and no changes were made. Patient states her Asthma has worsened in the past few weeks. She reports she has had to use her inhalers every 2 hours. She states she gets short of breath walking short distances.  Uses Proair a several times a day which she feels is making her anxious. Was previously on Advair or Flovent but she stopped these several months ago. She does not want to take Advair because the commercials on TV said it could cause death     Diabetes Mellitus Type II, Follow-up:   Lab Results  Component Value Date   HGBA1C 7.1* 07/01/2014   HGBA1C 6.9* 03/06/2014   Last seen for diabetes 4 months ago.  Management since then includes advising patient to work on diet and exercise. She reports fair compliance with treatment. Patient has been taking Metformin but has not been watching her diet or exercising. She is not having side effects.  Current symptoms include polyuria and have been worsening. Home blood sugar records: not being checked  Episodes of hypoglycemia? no   Current Insulin Regimen: none Most Recent Eye Exam: >1 year Weight trend: stable Prior visit with dietician: no Current diet: in general, an "unhealthy" diet Current exercise: none  ------------------------------------------------------------------------   Hypertension, follow-up:  BP Readings from Last 3 Encounters:  11/02/14 180/110  08/13/14 170/92  07/31/14 144/100    She was last seen for hypertension 3 months ago.  BP at that visit was 144/100. Management since that visit includes none.She reports good compliance with treatment. She is not  having side effects.  She is not exercising. She is not adherent to low salt diet.   Outside blood pressures are not being checked. She is experiencing dyspnea.  Patient denies chest pain, chest pressure/discomfort, claudication, exertional chest pressure/discomfort, fatigue, irregular heart beat, lower extremity edema, near-syncope, orthopnea, palpitations, paroxysmal nocturnal dyspnea, syncope and tachypnea.   Cardiovascular risk factors include advanced age (older than 27 for men, 75 for women), diabetes mellitus, dyslipidemia, hypertension and sedentary lifestyle.  Use of agents associated with hypertension: none.   ------------------------------------------------------------------------  States the last few weeks frequent urination and nocturia. No fevers chills or sweats. No abdominal of flank pain. States similar to previous UTI.   Has pain on right side of neck when turns head to the right. Started a few weeks ago and is improving. No radiation into her arms. No numbness or tingling. No other associated symptoms.    Results for orders placed or performed in visit on 11/02/14  POCT HgB A1C  Result Value Ref Range   Hemoglobin A1C 7.0    Est. average glucose Bld gHb Est-mCnc 154   POCT Urinalysis Dipstick  Result Value Ref Range   Color, UA yellow    Clarity, UA clear    Glucose, UA negative    Bilirubin, UA negative    Ketones, UA negative    Spec Grav, UA 1.025    Blood, UA negative    pH, UA 6.0    Protein, UA 30+    Urobilinogen, UA 0.2  Nitrite, UA POSITIVE    Leukocytes, UA Negative Negative     Allergies  Allergen Reactions  . Hydrochlorothiazide     Leg Cramps  . Penicillins Rash   Previous Medications   CALCIUM-VITAMIN D (OSCAL WITH D) 250-125 MG-UNIT PER TABLET    Take by mouth.   DOCUSATE SODIUM (STOOL SOFTENER) 100 MG CAPSULE    STOOL SOFTENER, 100MG  (Oral Tablet)  1 Every Day for 0 days  Quantity: 0.00;  Refills: 0   Ordered :06-Nov-2009   Meyer Cory ;  Started 29-Apr-2009 Active   LISINOPRIL (PRINIVIL,ZESTRIL) 10 MG TABLET    Take 1 tablet (10 mg total) by mouth daily.   LOVASTATIN (MEVACOR) 20 MG TABLET    Take 1 tablet (20 mg total) by mouth at bedtime.   METFORMIN (GLUCOPHAGE-XR) 750 MG 24 HR TABLET    Take 1,500 mg by mouth daily.    NAPROXEN SODIUM (ALEVE) 220 MG TABLET    Take by mouth.   NIFEDIPINE (PROCARDIA-XL/ADALAT CC) 30 MG 24 HR TABLET    Take by mouth.   PROAIR HFA 108 (90 BASE) MCG/ACT INHALER    INHALE 2 PUFFS EVERY 6 HOURS AS NEEDED   TRAMADOL (ULTRAM) 50 MG TABLET    Take 1 tablet (50 mg total) by mouth every 8 (eight) hours as needed.   TRAZODONE (DESYREL) 100 MG TABLET    TAKE 1 TABLET BY MOUTH EVERY NIGHT AT BEDTIME AS NEEDED FOR INSOMNIA    Review of Systems  Constitutional: Negative for fever, chills, appetite change and fatigue.  Respiratory: Positive for shortness of breath. Negative for chest tightness.   Cardiovascular: Negative for chest pain and palpitations.  Gastrointestinal: Negative for nausea, vomiting and abdominal pain.  Endocrine: Positive for polyuria.  Neurological: Negative for dizziness and weakness.    Social History  Substance Use Topics  . Smoking status: Never Smoker   . Smokeless tobacco: Not on file  . Alcohol Use: Yes     Comment: Occasional alcohol use   Objective:   BP 180/110 mmHg  Pulse 76  Temp(Src) 97.7 F (36.5 C) (Oral)  Resp 18  Wt 216 lb (97.977 kg)  SpO2 94%  Physical Exam  General Appearance:    Alert, cooperative, no distress, obese  Eyes:    PERRL, conjunctiva/corneas clear, EOM's intact       Lungs:     Occasional expiratory wheezes,  respirations unlabored  Heart:    Regular rate and rhythm  Neurologic:   Awake, alert, oriented x 3. No apparent focal neurological           defect.   MS:   Tender slightly swollen right SCM and paracervical muscles, painful with rotation of heat to right.        Assessment & Plan:     1. Diabetes  mellitus with nephropathy ([place]) Doing well with metformin.  - POCT HgB A1C   2. Essential (primary) hypertension Uncontrolled, likely due to anxiety and excessive use of inhaled betaagonist for asthma as below.Will double lisinopril to 20mg   A day and recheck BP in 1 month.   3. Intermittent asthma, uncontrolled, uncomplicated Worsening the last few weeks. She took herself off of maintenance inhalers. Add fluticasone HFA 220 2 puffs twice a day  4. Acute cystitis without hematuria Cipro 500mg  BID x 14 days - POCT Urinalysis Dipstick Call if symptoms change or if not rapidly improving.   5. Neck muscle strain Improving. Expect continued improvement over the next few weeks. Can  apply heat pad a few times every day. Call otherwise  Return in about 1 month (around 12/02/2014).    Addressed extensive list of chronic and acute medical problems today requiring extensive time in counseling and coordination care.  Over half of this 45 minute visit were spent in counseling and coordinating care of multiple medical problems.   Lelon Huh, MD  [facility]

## 2014-12-02 ENCOUNTER — Encounter: Payer: Self-pay | Admitting: Family Medicine

## 2014-12-02 ENCOUNTER — Ambulatory Visit (INDEPENDENT_AMBULATORY_CARE_PROVIDER_SITE_OTHER): Payer: Medicare Other | Admitting: Family Medicine

## 2014-12-02 VITALS — BP 140/84 | HR 81 | Temp 97.8°F | Resp 18 | Ht 60.75 in | Wt 217.0 lb

## 2014-12-02 DIAGNOSIS — J4520 Mild intermittent asthma, uncomplicated: Secondary | ICD-10-CM | POA: Diagnosis not present

## 2014-12-02 DIAGNOSIS — L723 Sebaceous cyst: Secondary | ICD-10-CM | POA: Diagnosis not present

## 2014-12-02 DIAGNOSIS — K5900 Constipation, unspecified: Secondary | ICD-10-CM | POA: Diagnosis not present

## 2014-12-02 DIAGNOSIS — Z23 Encounter for immunization: Secondary | ICD-10-CM | POA: Diagnosis not present

## 2014-12-02 DIAGNOSIS — I10 Essential (primary) hypertension: Secondary | ICD-10-CM | POA: Diagnosis not present

## 2014-12-02 DIAGNOSIS — L089 Local infection of the skin and subcutaneous tissue, unspecified: Secondary | ICD-10-CM

## 2014-12-02 DIAGNOSIS — M1990 Unspecified osteoarthritis, unspecified site: Secondary | ICD-10-CM | POA: Diagnosis not present

## 2014-12-02 MED ORDER — FLUTICASONE-SALMETEROL 115-21 MCG/ACT IN AERO: 2.0000 | INHALATION_SPRAY | Freq: Two times a day (BID) | RESPIRATORY_TRACT | Status: DC

## 2014-12-02 MED ORDER — SULFAMETHOXAZOLE-TRIMETHOPRIM 800-160 MG PO TABS: 1.0000 | ORAL_TABLET | Freq: Two times a day (BID) | ORAL | Status: AC

## 2014-12-02 MED ORDER — ALBUTEROL SULFATE HFA 108 (90 BASE) MCG/ACT IN AERS: 2.0000 | INHALATION_SPRAY | Freq: Four times a day (QID) | RESPIRATORY_TRACT | Status: DC | PRN

## 2014-12-02 NOTE — Progress Notes (Signed)
Patient: [name] Female    DOB: 22-Aug-1945   69 y.o.   MRN: [id number] Visit Date: 12/02/2014  Today's Provider: Lelon Huh, MD   Chief Complaint  Patient presents with  . Follow-up  . Hypertension  . Diabetes   Subjective:    HPI    Follow up asthma Restarted Flovent at last visit 11/02/14. Still needing to use Proventil several times a day, which helps breathing for a few hours.     Diabetes Mellitus Type II, Follow-up:   Lab Results  Component Value Date   HGBA1C 7.0 11/02/2014   HGBA1C 7.1* 07/01/2014   HGBA1C 6.9* 03/06/2014   Last seen for diabetes 1 months ago.  Management since then includes none She reports fair compliance with treatment. She is not having side effects. none Current symptoms include none and have been unchanged. Home blood sugar records: fasting range: n/a  Episodes of hypoglycemia? no   Current Insulin Regimen: n/a Weight trend: stable Prior visit with dietician: no Current diet: in general, an "unhealthy" diet Current exercise: none  ----------------------------------------------------------------------   Hypertension, follow-up:  BP Readings from Last 3 Encounters:  12/02/14 140/84  11/02/14 180/110  08/13/14 170/92    She was last seen for hypertension 1 months ago.  BP at that visit was 180/110 Management since that visit includes;Uncontrolled, likely due to anxiety and excessive use of inhaled betaagonist for asthma as below.Will double lisinopril to 20mg  A day and recheck BP in 1 month.  She reports fair compliance with treatment. She is not having side effects. none  She is not exercising. She is adherent to low salt diet.   Outside blood pressures are n/a. She is experiencing none.  Patient denies none.   Cardiovascular risk factors include diabetes mellitus.  Use of agents associated with hypertension: none.   ----------------------------------------------------------------------  Knee  pain She reports long history of bilateral knee pain and has seen orthopedics in the past. Apparently they discussed having knee replacement surgery which she does not want, however she is interested in steroid or Synvisc injections.      Allergies  Allergen Reactions  . Hydrochlorothiazide     Leg Cramps  . Penicillins Rash   Previous Medications   CALCIUM-VITAMIN D (OSCAL WITH D) 250-125 MG-UNIT PER TABLET    Take by mouth.   DOCUSATE SODIUM (STOOL SOFTENER) 100 MG CAPSULE    STOOL SOFTENER, 100MG  (Oral Tablet)  1 Every Day for 0 days  Quantity: 0.00;  Refills: 0   Ordered :06-Nov-2009  Meyer Cory ;  Started 29-Apr-2009 Active   FLUTICASONE (FLOVENT HFA) 220 MCG/ACT INHALER    Inhale 2 puffs into the lungs 2 (two) times daily.   LISINOPRIL (PRINIVIL,ZESTRIL) 20 MG TABLET    Take 1 tablet (20 mg total) by mouth daily.   LOVASTATIN (MEVACOR) 20 MG TABLET    Take 1 tablet (20 mg total) by mouth at bedtime.   METFORMIN (GLUCOPHAGE-XR) 750 MG 24 HR TABLET    Take 1,500 mg by mouth daily.    NAPROXEN SODIUM (ALEVE) 220 MG TABLET    Take by mouth.   NIFEDIPINE (PROCARDIA-XL/ADALAT CC) 30 MG 24 HR TABLET    Take by mouth.   PROAIR HFA 108 (90 BASE) MCG/ACT INHALER    INHALE 2 PUFFS EVERY 6 HOURS AS NEEDED   TRAMADOL (ULTRAM) 50 MG TABLET    Take 1 tablet (50 mg total) by mouth every 8 (eight) hours as needed.  TRAZODONE (DESYREL) 100 MG TABLET    TAKE 1 TABLET BY MOUTH EVERY NIGHT AT BEDTIME AS NEEDED FOR INSOMNIA    Review of Systems  Respiratory: Positive for shortness of breath and wheezing. Negative for chest tightness.        Patient has been having a lot of trouble with her asthma. She uses her inhaler and then has to use it again shortly after. Says her inhaler doesn't seen to be lasting.  Cardiovascular: Negative for chest pain and palpitations.  Neurological: Negative for dizziness and light-headedness.    Social History  Substance Use Topics  . Smoking status:  Never Smoker   . Smokeless tobacco: Not on file  . Alcohol Use: Yes     Comment: Occasional alcohol use   Objective:   BP 140/84 mmHg  Pulse 81  Temp(Src) 97.8 F (36.6 C) (Oral)  Resp 18  Ht 5' 0.75" (1.543 m)  Wt 217 lb (98.431 kg)  BMI 41.34 kg/m2  SpO2 97%  Physical Exam  General Appearance:    Alert, cooperative, no distress, obese  Eyes:    PERRL, conjunctiva/corneas clear, EOM's intact       Lungs:     Clear to auscultation bilaterally, respirations unlabored  Heart:    Regular rate and rhythm  Neurologic:   Awake, alert, oriented x 3. No apparent focal neurological           defect.          Assessment & Plan:     1. Essential (primary) hypertension Well controlled.  Continue current medications.    2. Constipation, unspecified constipation type Advised to start taking OTC metamucil every day  3. Intermittent asthma, uncontrolled, uncomplicated Change maintenance inhaler from Flovent to Advair - fluticasone-salmeterol (ADVAIR HFA) 115-21 MCG/ACT inhaler; Inhale 2 puffs into the lungs 2 (two) times daily.  Dispense: 1 Inhaler; Refill: 12 - albuterol (PROAIR HFA) 108 (90 BASE) MCG/ACT inhaler; Inhale 2 puffs into the lungs every 6 (six) hours as needed.  Dispense: 18 Inhaler; Refill: 3  4. Osteoarthritis, unspecified osteoarthritis type, unspecified site Recommend she see orthopedist and discuss steroid and Synvisc injections.   5. Infected sebaceous cyst  - sulfamethoxazole-trimethoprim (BACTRIM DS,SEPTRA DS) 800-160 MG tablet; Take 1 tablet by mouth 2 (two) times daily.  Dispense: 28 tablet; Refill: 0  6. Need for influenza vaccination  - Flu vaccine HIGH DOSE PF       Lelon Huh, MD  [facility]

## 2014-12-02 NOTE — Patient Instructions (Signed)
Recommend [place] to evaluate knee pain and to consider knee injections Start taking OTC metamucil every day for constipation

## 2015-01-02 ENCOUNTER — Other Ambulatory Visit: Payer: Self-pay | Admitting: Family Medicine

## 2015-01-05 NOTE — Telephone Encounter (Signed)
Please call in tramadol.  

## 2015-01-05 NOTE — Telephone Encounter (Signed)
Rx called in to pharmacy. 

## 2015-01-15 ENCOUNTER — Other Ambulatory Visit: Payer: Self-pay | Admitting: Family Medicine

## 2015-01-17 LAB — HM DIABETES EYE EXAM

## 2015-01-25 DIAGNOSIS — H348322 Tributary (branch) retinal vein occlusion, left eye, stable: Secondary | ICD-10-CM | POA: Diagnosis not present

## 2015-01-25 DIAGNOSIS — H34831 Tributary (branch) retinal vein occlusion, right eye, with macular edema: Secondary | ICD-10-CM | POA: Diagnosis not present

## 2015-01-25 DIAGNOSIS — H25813 Combined forms of age-related cataract, bilateral: Secondary | ICD-10-CM | POA: Diagnosis not present

## 2015-01-25 DIAGNOSIS — H524 Presbyopia: Secondary | ICD-10-CM | POA: Diagnosis not present

## 2015-01-25 DIAGNOSIS — H5203 Hypermetropia, bilateral: Secondary | ICD-10-CM | POA: Diagnosis not present

## 2015-01-25 LAB — HM DIABETES EYE EXAM

## 2015-02-09 ENCOUNTER — Encounter: Payer: Self-pay | Admitting: Family Medicine

## 2015-02-09 DIAGNOSIS — E113419 Type 2 diabetes mellitus with severe nonproliferative diabetic retinopathy with macular edema, unspecified eye: Secondary | ICD-10-CM

## 2015-02-12 DIAGNOSIS — H34831 Tributary (branch) retinal vein occlusion, right eye, with macular edema: Secondary | ICD-10-CM | POA: Diagnosis not present

## 2015-02-17 DIAGNOSIS — H34831 Tributary (branch) retinal vein occlusion, right eye, with macular edema: Secondary | ICD-10-CM | POA: Diagnosis not present

## 2015-02-19 DIAGNOSIS — E11311 Type 2 diabetes mellitus with unspecified diabetic retinopathy with macular edema: Secondary | ICD-10-CM | POA: Insufficient documentation

## 2015-03-02 ENCOUNTER — Ambulatory Visit (INDEPENDENT_AMBULATORY_CARE_PROVIDER_SITE_OTHER): Payer: Medicare Other | Admitting: Family Medicine

## 2015-03-02 ENCOUNTER — Encounter: Payer: Self-pay | Admitting: Family Medicine

## 2015-03-02 VITALS — BP 142/90 | HR 77 | Temp 97.5°F | Resp 18

## 2015-03-02 DIAGNOSIS — R32 Unspecified urinary incontinence: Secondary | ICD-10-CM

## 2015-03-02 DIAGNOSIS — E113419 Type 2 diabetes mellitus with severe nonproliferative diabetic retinopathy with macular edema, unspecified eye: Secondary | ICD-10-CM | POA: Diagnosis not present

## 2015-03-02 DIAGNOSIS — G4700 Insomnia, unspecified: Secondary | ICD-10-CM | POA: Diagnosis not present

## 2015-03-02 DIAGNOSIS — J4520 Mild intermittent asthma, uncomplicated: Secondary | ICD-10-CM | POA: Diagnosis not present

## 2015-03-02 DIAGNOSIS — N393 Stress incontinence (female) (male): Secondary | ICD-10-CM

## 2015-03-02 DIAGNOSIS — I10 Essential (primary) hypertension: Secondary | ICD-10-CM | POA: Diagnosis not present

## 2015-03-02 LAB — POCT GLYCOSYLATED HEMOGLOBIN (HGB A1C)
Est. average glucose Bld gHb Est-mCnc: 146
Hemoglobin A1C: 6.7

## 2015-03-02 MED ORDER — ALBUTEROL SULFATE HFA 108 (90 BASE) MCG/ACT IN AERS: 2.0000 | INHALATION_SPRAY | Freq: Four times a day (QID) | RESPIRATORY_TRACT | Status: DC | PRN

## 2015-03-02 MED ORDER — METFORMIN HCL ER 750 MG PO TB24: 1500.0000 mg | ORAL_TABLET | Freq: Every day | ORAL | Status: DC

## 2015-03-02 MED ORDER — FLUTICASONE-SALMETEROL 115-21 MCG/ACT IN AERO: 2.0000 | INHALATION_SPRAY | Freq: Two times a day (BID) | RESPIRATORY_TRACT | Status: DC

## 2015-03-02 NOTE — Progress Notes (Signed)
Patient: [name] Female    DOB: 14-Jan-1945   70 y.o.   MRN: [id number] Visit Date: 03/02/2015  Today's Provider: Lelon Huh, MD   Chief Complaint  Patient presents with  . Hypertension    follow up   . Diabetes    follow up  . Asthma    follow up    Subjective:    HPI   Diabetes Mellitus Type II, Follow-up:   Lab Results  Component Value Date   HGBA1C 7.0 11/02/2014   HGBA1C 7.1* 07/01/2014   HGBA1C 6.9* 03/06/2014    Last seen for diabetes 3 months ago.  Management since then includes no changes. She reports good compliance with treatment. She is not having side effects.  Current symptoms include polyuria, visual disturbances and weight loss and have been stable. Home blood sugar records: not being checked  Episodes of hypoglycemia? no   Current Insulin Regimen: none Most Recent Eye Exam: <1 year Weight trend: decreasing steadily Prior visit with dietician: no Current diet: in general, an "unhealthy" diet Current exercise: none  Pertinent Labs:    Component Value Date/Time   CHOL 193 07/01/2014 1048   CHOL 229* 03/06/2014   TRIG 64 07/01/2014 1048   HDL 81 07/01/2014 1048   HDL 82* 03/18/2014   LDLCALC 99 07/01/2014 1048   LDLCALC 132 03/06/2014   CREATININE 0.83 07/01/2014 1048   CREATININE 0.8 03/06/2014    Wt Readings from Last 3 Encounters:  12/02/14 217 lb (98.431 kg)  11/02/14 216 lb (97.977 kg)  07/31/14 220 lb (99.791 kg)    ------------------------------------------------------------------------   Hypertension, follow-up:  BP Readings from Last 3 Encounters:  12/02/14 140/84  11/02/14 180/110  08/13/14 170/92    She was last seen for hypertension 3 months ago.  BP at that visit was 140/84. Management since that visit includes no changes. She reports good compliance with treatment. She is not having side effects.  She is not exercising. She is not adherent to low salt diet.   Outside blood pressures are not  being checked. She is experiencing none.  Patient denies none.   Cardiovascular risk factors include advanced age (older than 44 for men, 55 for women), diabetes mellitus, dyslipidemia, hypertension, obesity (BMI >= 30 kg/m2) and sedentary lifestyle.  Use of agents associated with hypertension: none.     Weight trend: decreasing steadily Wt Readings from Last 3 Encounters:  12/02/14 217 lb (98.431 kg)  11/02/14 216 lb (97.977 kg)  07/31/14 220 lb (99.791 kg)    Current diet: in general, an "unhealthy" diet  ------------------------------------------------------------------------  Follow up Asthma:  Last office visit was 3 months ago. Changes made during that visit includes changing maintenance inhaler from Flovent to Advair. Patient reports good compliance with treatment, good tolerance and good symptom control.      Allergies  Allergen Reactions  . Hydrochlorothiazide     Leg Cramps  . Penicillins Rash   Previous Medications   ALBUTEROL (PROAIR HFA) 108 (90 BASE) MCG/ACT INHALER    Inhale 2 puffs into the lungs every 6 (six) hours as needed.   CALCIUM-VITAMIN D (OSCAL WITH D) 250-125 MG-UNIT PER TABLET    Take by mouth.   DOCUSATE SODIUM (STOOL SOFTENER) 100 MG CAPSULE    STOOL SOFTENER, 100MG  (Oral Tablet)  1 Every Day for 0 days  Quantity: 0.00;  Refills: 0   Ordered :06-Nov-2009  Meyer Cory ;  Started 29-Apr-2009 Active   FLUTICASONE-SALMETEROL (ADVAIR HFA)  115-21 MCG/ACT INHALER    Inhale 2 puffs into the lungs 2 (two) times daily.   LISINOPRIL (PRINIVIL,ZESTRIL) 20 MG TABLET    Take 1 tablet (20 mg total) by mouth daily.   LOVASTATIN (MEVACOR) 20 MG TABLET    Take 1 tablet (20 mg total) by mouth at bedtime.   METFORMIN (GLUCOPHAGE-XR) 750 MG 24 HR TABLET    Take 1,500 mg by mouth daily.    NAPROXEN SODIUM (ALEVE) 220 MG TABLET    Take by mouth.   NIFEDIPINE (PROCARDIA-XL/ADALAT CC) 30 MG 24 HR TABLET    Take by mouth.   TRAMADOL (ULTRAM) 50 MG TABLET    TAKE 1  TABLET BY MOUTH EVERY 8 HOURS AS NEEDED   TRAZODONE (DESYREL) 100 MG TABLET    TAKE 1 TABLET BY MOUTH EVERY NIGHT AT BEDTIME AS NEEDED FOR INSOMNIA    Review of Systems  Constitutional: Negative for fever, chills, appetite change and fatigue.  Eyes: Positive for visual disturbance (trouble with vision).  Respiratory: Positive for shortness of breath. Negative for chest tightness.   Cardiovascular: Negative for chest pain and palpitations.  Gastrointestinal: Negative for nausea, vomiting and abdominal pain.  Endocrine: Positive for polyuria.  Musculoskeletal: Positive for arthralgias (left knee pain).  Neurological: Negative for dizziness and weakness.    Social History  Substance Use Topics  . Smoking status: Never Smoker   . Smokeless tobacco: Not on file  . Alcohol Use: Yes     Comment: Occasional alcohol use   Objective:   BP 160/90 mmHg  Pulse 77  Temp(Src) 97.5 F (36.4 C) (Oral)  Resp 18  SpO2 94%  Physical Exam  General Appearance:    Alert, cooperative, no distress, obese  Eyes:    PERRL, conjunctiva/corneas clear, EOM's intact       Lungs:     Clear to auscultation bilaterally, respirations unlabored  Heart:    Regular rate and rhythm  Neurologic:   Awake, alert, oriented x 3. No apparent focal neurological           defect.       Results for orders placed or performed in visit on 03/02/15  POCT HgB A1C  Result Value Ref Range   Hemoglobin A1C 6.7    Est. average glucose Bld gHb Est-mCnc 146        Assessment & Plan:     1. Severe nonproliferative diabetic retinopathy with macular edema associated with type 2 diabetes mellitus DM well controlled. Continue current medications.   - POCT HgB A1C - metFORMIN (GLUCOPHAGE-XR) 750 MG 24 hr tablet; Take 2 tablets (1,500 mg total) by mouth daily.  Dispense: 60 tablet; Refill: 12  2. Essential (primary) hypertension BP up a bit . Continue current medications.  Consider increase ACEI if not improved at follow up.     3. Intermittent asthma, uncontrolled, uncomplicated  - fluticasone-salmeterol (ADVAIR HFA) 115-21 MCG/ACT inhaler; Inhale 2 puffs into the lungs 2 (two) times daily.  Dispense: 1 Inhaler; Refill: 12 - albuterol (PROAIR HFA) 108 (90 Base) MCG/ACT inhaler; Inhale 2 puffs into the lungs every 6 (six) hours as needed.  Dispense: 18 Inhaler; Refill: 3  4. Insomnia Doing fairly well on trazadone.   5. Incontinence in female She states that her bladder often leaks after getting up and starting to walk. I reccommended that she see a urologist for this and she states she will think about it.        Lelon Huh, MD  [facility]  [facility]

## 2015-03-28 ENCOUNTER — Other Ambulatory Visit: Payer: Self-pay | Admitting: Family Medicine

## 2015-03-31 DIAGNOSIS — E113411 Type 2 diabetes mellitus with severe nonproliferative diabetic retinopathy with macular edema, right eye: Secondary | ICD-10-CM | POA: Diagnosis not present

## 2015-04-02 DIAGNOSIS — E113412 Type 2 diabetes mellitus with severe nonproliferative diabetic retinopathy with macular edema, left eye: Secondary | ICD-10-CM | POA: Diagnosis not present

## 2015-05-03 DIAGNOSIS — E113411 Type 2 diabetes mellitus with severe nonproliferative diabetic retinopathy with macular edema, right eye: Secondary | ICD-10-CM | POA: Diagnosis not present

## 2015-05-14 DIAGNOSIS — E113412 Type 2 diabetes mellitus with severe nonproliferative diabetic retinopathy with macular edema, left eye: Secondary | ICD-10-CM | POA: Diagnosis not present

## 2015-06-11 DIAGNOSIS — H34831 Tributary (branch) retinal vein occlusion, right eye, with macular edema: Secondary | ICD-10-CM | POA: Diagnosis not present

## 2015-06-14 DIAGNOSIS — E113412 Type 2 diabetes mellitus with severe nonproliferative diabetic retinopathy with macular edema, left eye: Secondary | ICD-10-CM | POA: Diagnosis not present

## 2015-06-25 ENCOUNTER — Other Ambulatory Visit: Payer: Self-pay | Admitting: Family Medicine

## 2015-06-25 ENCOUNTER — Ambulatory Visit (INDEPENDENT_AMBULATORY_CARE_PROVIDER_SITE_OTHER): Payer: Medicare Other | Admitting: Family Medicine

## 2015-06-25 ENCOUNTER — Encounter: Payer: Self-pay | Admitting: Family Medicine

## 2015-06-25 VITALS — BP 150/100 | HR 92 | Temp 97.6°F | Resp 18 | Ht 60.75 in | Wt 207.0 lb

## 2015-06-25 DIAGNOSIS — J4520 Mild intermittent asthma, uncomplicated: Secondary | ICD-10-CM

## 2015-06-25 DIAGNOSIS — E785 Hyperlipidemia, unspecified: Secondary | ICD-10-CM

## 2015-06-25 DIAGNOSIS — E1121 Type 2 diabetes mellitus with diabetic nephropathy: Secondary | ICD-10-CM | POA: Diagnosis not present

## 2015-06-25 DIAGNOSIS — E559 Vitamin D deficiency, unspecified: Secondary | ICD-10-CM | POA: Diagnosis not present

## 2015-06-25 DIAGNOSIS — I10 Essential (primary) hypertension: Secondary | ICD-10-CM

## 2015-06-25 DIAGNOSIS — M1990 Unspecified osteoarthritis, unspecified site: Secondary | ICD-10-CM

## 2015-06-25 LAB — POCT GLYCOSYLATED HEMOGLOBIN (HGB A1C)
Est. average glucose Bld gHb Est-mCnc: 137
Hemoglobin A1C: 6.4

## 2015-06-25 MED ORDER — MONTELUKAST SODIUM 10 MG PO TABS: 10.0000 mg | ORAL_TABLET | Freq: Every day | ORAL | Status: DC

## 2015-06-25 MED ORDER — FLUTICASONE-SALMETEROL 115-21 MCG/ACT IN AERO: 2.0000 | INHALATION_SPRAY | Freq: Two times a day (BID) | RESPIRATORY_TRACT | Status: DC

## 2015-06-25 MED ORDER — MELOXICAM 15 MG PO TABS: 15.0000 mg | ORAL_TABLET | Freq: Every day | ORAL | Status: DC | PRN

## 2015-06-25 NOTE — Progress Notes (Signed)
Patient: [name] Female    DOB: [birth year]   70 y.o.   MRN: [id number] Visit Date: 06/25/2015  Today's Provider: Lelon Huh, MD   Chief Complaint  Patient presents with  . Follow-up  . Diabetes  . Hypertension  . Hyperlipidemia  . Asthma   Subjective:    HPI   Intermittent asthma, uncontrolled, uncomplicated: From [id number] changes were made. Has been having more coughing lately.   Arthrtis Has had much worse arthritis pain the last month since it has been so rainy.     Diabetes Mellitus Type II, Follow-up:   Lab Results  Component Value Date   HGBA1C 6.7 03/02/2015   HGBA1C 7.0 11/02/2014   HGBA1C 7.1* 07/01/2014   Last seen for diabetes 4 months ago.  Management since then includes; no changes. She reports fair compliance with treatment. She is not having side effects. none Current symptoms include none and have been unchanged. Home blood sugar records: does not check  Episodes of hypoglycemia? no   Current Insulin Regimen: n/a Most Recent Eye Exam: 01/25/15 Weight trend: stable Prior visit with dietician: no Current diet: well balanced Current exercise: none  ----------------------------------------------------------------------    Hypertension, follow-up:  BP Readings from Last 3 Encounters:  06/25/15 150/100  03/02/15 142/90  12/02/14 140/84    She was last seen for hypertension 4 months ago.  BP at that visit was 160/90. Management since that visit includes;Continue current medications. Consider increase ACEI if not improved at follow up. .She reports fair compliance with treatment. She is not having side effects. Feet are swelling  She is not exercising. She is not adherent to low salt diet.   Outside blood pressures are none. She is experiencing none.  Patient denies none.   Cardiovascular risk factors include diabetes mellitus.  Use of agents associated with hypertension: none.    ----------------------------------------------------------------------    Lipid/Cholesterol, Follow-up:   Last seen for this 1 year ago.  Management since that visit includes; no changes.  Last Lipid Panel:    Component Value Date/Time   CHOL 193 07/01/2014 1048   CHOL 229* 03/06/2014   TRIG 64 07/01/2014 1048   HDL 81 07/01/2014 1048   HDL 82* 03/18/2014   CHOLHDL 2.4 07/01/2014 1048   LDLCALC 99 07/01/2014 1048   LDLCALC 132 03/06/2014    She reports fair compliance with treatment. She is not having side effects. none  Wt Readings from Last 3 Encounters:  06/25/15 207 lb (93.895 kg)  12/02/14 217 lb (98.431 kg)  11/02/14 216 lb (97.977 kg)    ----------------------------------------------------------------------  Patient has been having a lot of asthma flare-ups the last 2 week. Has cough,sob, wheezing and body aches. Persistent cough with mucous production.  Has been out of Advair which had been helping quite a bit. Using albuterol inhaler several times a day for cough.   Also has been having edema in the top of both feet.     Allergies  Allergen Reactions  . Hydrochlorothiazide     Leg Cramps  . Penicillins Rash   Current Meds  Medication Sig  . albuterol (PROAIR HFA) 108 (90 Base) MCG/ACT inhaler Inhale 2 puffs into the lungs every 6 (six) hours as needed.  . calcium-vitamin D (OSCAL WITH D) 250-125 MG-UNIT per tablet Take by mouth.  Mariane Baumgarten Sodium (STOOL SOFTENER) 100 MG capsule STOOL SOFTENER, 100MG  (Oral Tablet)  1 Every Day for 0 days  Quantity: 0.00;  Refills: 0  Ordered :06-Nov-2009  Meyer Cory ;  Started 29-Apr-2009 Active  . fluticasone-salmeterol (ADVAIR HFA) 115-21 MCG/ACT inhaler Inhale 2 puffs into the lungs 2 (two) times daily (NOT USING IS OUT OF MEDICATION).  Marland Kitchen lisinopril (PRINIVIL,ZESTRIL) 20 MG tablet TAKE 1 TABLET (20 MG TOTAL) BY MOUTH DAILY.  Marland Kitchen lovastatin (MEVACOR) 20 MG tablet Take 1 tablet (20 mg total) by mouth at  bedtime.  . metFORMIN (GLUCOPHAGE-XR) 750 MG 24 hr tablet Take 2 tablets (1,500 mg total) by mouth daily.  . naproxen sodium (ALEVE) 220 MG tablet Take by mouth.  Marland Kitchen NIFEdipine (PROCARDIA-XL/ADALAT CC) 30 MG 24 hr tablet Take by mouth.  Vladimir Faster Glycol-Propyl Glycol (SYSTANE) 0.4-0.3 % GEL ophthalmic gel Place 1 application into both eyes.  Bethann Humble Sulfate (EYE DROPS A/C OP) Apply to eye.  . traMADol (ULTRAM) 50 MG tablet TAKE 1 TABLET BY MOUTH EVERY 8 HOURS AS NEEDED  . traZODone (DESYREL) 100 MG tablet TAKE 1 TABLET BY MOUTH EVERY NIGHT AT BEDTIME AS NEEDED FOR INSOMNIA    Review of Systems  Constitutional: Negative for fever, chills, appetite change and fatigue.  Respiratory: Positive for cough, shortness of breath and wheezing. Negative for chest tightness.   Cardiovascular: Positive for leg swelling. Negative for chest pain and palpitations.  Gastrointestinal: Negative for nausea, vomiting and abdominal pain.  Musculoskeletal: Positive for myalgias and joint swelling.  Neurological: Negative for dizziness and weakness.    Social History  Substance Use Topics  . Smoking status: Never Smoker   . Smokeless tobacco: Not on file  . Alcohol Use: Yes     Comment: Occasional alcohol use   Objective:   BP 150/100 mmHg  Pulse 92  Temp(Src) 97.6 F (36.4 C) (Oral)  Resp 18  Ht 5' 0.75" (1.543 m)  Wt 207 lb (93.895 kg)  BMI 39.44 kg/m2  SpO2 92%  Physical Exam  General Appearance:    Alert, cooperative, no distress  HENT:   bilateral TM normal without fluid or infection, neck without nodes, throat normal without erythema or exudate, post nasal drip noted and nasal mucosa pale and congested  Eyes:    PERRL, conjunctiva/corneas clear, EOM's intact       Lungs:     Clear to auscultation bilaterally, respirations unlabored  Heart:    Regular rate and rhythm  Neurologic:   Awake, alert, oriented x 3. No apparent focal neurological           defect.        Results  for orders placed or performed in visit on 06/25/15  POCT glycosylated hemoglobin (Hb A1C)  Result Value Ref Range   Hemoglobin A1C 6.4    Est. average glucose Bld gHb Est-mCnc 137        Assessment & Plan:     1. Diabetes mellitus with nephropathy ([place]) Well controlled.   - POCT glycosylated hemoglobin (Hb A1C)  2. Intermittent asthma, uncontrolled, uncosmplicated Mild flare, is off of Advair which we will restart. Start trial of montelukast.  - fluticasone-salmeterol (ADVAIR HFA) 115-21 MCG/ACT inhaler; Inhale 2 puffs into the lungs 2 (two) times daily.  Dispense: 1 Inhaler; Refill: 12  3. Essential (primary) hypertension Stable. May be exacerbated by excessive use of albuterol which should be able to reduce after getting back on Advair and starting singulair.  - Renal function panel  4. HLD (hyperlipidemia)  - Lipid panel - Hepatic function panel  5. Vitamin D deficiency  - VITAMIN D 25 Hydroxy (Vit-D Deficiency, Fractures)  6.  Osteoarthritis, unspecified osteoarthritis type, unspecified site Worsening arthritis pain not controlled with tramadol. Try meloxicam.        Lelon Huh, MD  [facility]

## 2015-06-26 LAB — HEPATIC FUNCTION PANEL
ALBUMIN: 3.7 g/dL (ref 3.5–4.8)
ALT: 17 IU/L (ref 0–32)
AST: 19 IU/L (ref 0–40)
Alkaline Phosphatase: 64 IU/L (ref 39–117)
BILIRUBIN TOTAL: 0.4 mg/dL (ref 0.0–1.2)
BILIRUBIN, DIRECT: 0.14 mg/dL (ref 0.00–0.40)
Total Protein: 7.4 g/dL (ref 6.0–8.5)

## 2015-06-26 LAB — LIPID PANEL
Chol/HDL Ratio: 2.4 ratio units (ref 0.0–4.4)
Cholesterol, Total: 139 mg/dL (ref 100–199)
HDL: 59 mg/dL (ref >39)
LDL Calculated: 67 mg/dL (ref 0–99)
TRIGLYCERIDES: 64 mg/dL (ref 0–149)
VLDL CHOLESTEROL CAL: 13 mg/dL (ref 5–40)

## 2015-06-26 LAB — VITAMIN D 25 HYDROXY (VIT D DEFICIENCY, FRACTURES): Vit D, 25-Hydroxy: 33.2 ng/mL (ref 30.0–100.0)

## 2015-06-27 LAB — RENAL FUNCTION PANEL
ALBUMIN: 3.6 g/dL (ref 3.5–4.8)
BUN/Creatinine Ratio: 19 (ref 12–28)
BUN: 14 mg/dL (ref 8–27)
CALCIUM: 9.4 mg/dL (ref 8.7–10.3)
CHLORIDE: 102 mmol/L (ref 96–106)
CO2: 26 mmol/L (ref 18–29)
CREATININE: 0.75 mg/dL (ref 0.57–1.00)
GFR calc Af Amer: 93 mL/min/{1.73_m2} (ref >59)
GFR, EST NON AFRICAN AMERICAN: 80 mL/min/{1.73_m2} (ref >59)
Glucose: 94 mg/dL (ref 65–99)
PHOSPHORUS: 3.8 mg/dL (ref 2.5–4.5)
Potassium: 3.6 mmol/L (ref 3.5–5.2)
SODIUM: 141 mmol/L (ref 134–144)

## 2015-06-30 ENCOUNTER — Ambulatory Visit: Payer: Self-pay | Admitting: Family Medicine

## 2015-07-14 ENCOUNTER — Telehealth: Payer: Self-pay

## 2015-07-14 ENCOUNTER — Ambulatory Visit (INDEPENDENT_AMBULATORY_CARE_PROVIDER_SITE_OTHER): Payer: Medicare Other | Admitting: Family Medicine

## 2015-07-14 ENCOUNTER — Ambulatory Visit
Admission: RE | Admit: 2015-07-14 | Discharge: 2015-07-14 | Disposition: A | Discharge status: Home / Self Care | Payer: Medicare Other | Source: Ambulatory Visit | Attending: Family Medicine | Admitting: Family Medicine

## 2015-07-14 ENCOUNTER — Encounter: Payer: Self-pay | Admitting: Family Medicine

## 2015-07-14 VITALS — BP 178/90 | HR 99 | Temp 98.3°F | Resp 18 | Wt 215.0 lb

## 2015-07-14 DIAGNOSIS — R011 Cardiac murmur, unspecified: Secondary | ICD-10-CM

## 2015-07-14 DIAGNOSIS — R0609 Other forms of dyspnea: Secondary | ICD-10-CM

## 2015-07-14 DIAGNOSIS — I350 Nonrheumatic aortic (valve) stenosis: Secondary | ICD-10-CM | POA: Insufficient documentation

## 2015-07-14 DIAGNOSIS — R05 Cough: Secondary | ICD-10-CM | POA: Diagnosis not present

## 2015-07-14 DIAGNOSIS — J90 Pleural effusion, not elsewhere classified: Secondary | ICD-10-CM | POA: Diagnosis not present

## 2015-07-14 DIAGNOSIS — J4520 Mild intermittent asthma, uncomplicated: Secondary | ICD-10-CM

## 2015-07-14 DIAGNOSIS — I517 Cardiomegaly: Secondary | ICD-10-CM | POA: Diagnosis not present

## 2015-07-14 DIAGNOSIS — R0602 Shortness of breath: Secondary | ICD-10-CM | POA: Diagnosis not present

## 2015-07-14 MED ORDER — PREDNISONE 20 MG PO TABS: 20.0000 mg | ORAL_TABLET | Freq: Two times a day (BID) | ORAL | Status: AC

## 2015-07-14 MED ORDER — LEVALBUTEROL HCL 1.25 MG/3ML IN NEBU: 1.2500 mg | INHALATION_SOLUTION | Freq: Three times a day (TID) | RESPIRATORY_TRACT | Status: DC | PRN

## 2015-07-14 MED ORDER — FUROSEMIDE 20 MG PO TABS: 20.0000 mg | ORAL_TABLET | Freq: Every day | ORAL | Status: DC

## 2015-07-14 MED ORDER — FLUTICASONE FUROATE-VILANTEROL 200-25 MCG/INH IN AEPB: 1.0000 | INHALATION_SPRAY | Freq: Every day | RESPIRATORY_TRACT | Status: DC

## 2015-07-14 NOTE — Telephone Encounter (Signed)
-----   Message from Birdie Sons, MD sent at 07/14/2015  5:19 PM EDT ----- [name] shows small amount of fluid at bases of lungs which make it harder to breath. Have sent prescription for furosemide to her pharmacy. Also looks like asthma flare, have sent prescription for prednisone to her pharmacy as well. Need echo cardiogram scheduled to check out heart valves due to her heart murmur. Have sent order to Sarah to schedule. Be sure to take 81mg  coated aspirin daily.

## 2015-07-14 NOTE — Telephone Encounter (Signed)
Tried calling patient and no answer. Will try again later.  

## 2015-07-14 NOTE — Progress Notes (Signed)
Patient: [name] Female    DOB: 07/07/1945   70 y.o.   MRN: [id number] Visit Date: 07/14/2015  Today's Provider: Lelon Huh, MD   Chief Complaint  Patient presents with  . Asthma   Subjective:    HPI Shortness of breath: Patient comes in stating for the past 12 days, she has been having dyspnea on exertion. Patient gets short of breath when performing ADL. Patient has tried using her prescribed inhalers, but says they dont provide any relief. Patient has been using her Xopenex Nebulizer treatment 3 times a day. She reports that is the only thing that improves the shortness of breath. Breathing worsens whenever she coughs. She was started back on Advair at her o.v. On 06/25/15, but admits that she took it excessively and states it doesn't help at all. Counter is now at 0. She states she coughs up large amount of clear mucous, usually in the afternoons or evenings. Does not seem as bad overnight. No fevers or chills.     Allergies  Allergen Reactions  . Hydrochlorothiazide     Leg Cramps  . Penicillins Rash   Current Meds  Medication Sig  . albuterol (PROAIR HFA) 108 (90 Base) MCG/ACT inhaler Inhale 2 puffs into the lungs every 6 (six) hours as needed.  . levalbuterol (XOPENEX) 1.25 MG/3ML nebulizer solution Take 1.25 mg by nebulization every 8 (eight) hours as needed for wheezing.  Marland Kitchen lisinopril (PRINIVIL,ZESTRIL) 20 MG tablet TAKE 1 TABLET (20 MG TOTAL) BY MOUTH DAILY.  Marland Kitchen lovastatin (MEVACOR) 20 MG tablet Take 1 tablet (20 mg total) by mouth at bedtime.  . meloxicam (MOBIC) 15 MG tablet Take 1 tablet (15 mg total) by mouth daily as needed for pain. Take with food  . metFORMIN (GLUCOPHAGE-XR) 750 MG 24 hr tablet Take 2 tablets (1,500 mg total) by mouth daily.  . montelukast (SINGULAIR) 10 MG tablet Take 1 tablet (10 mg total) by mouth daily. For chronic cough  . naproxen sodium (ALEVE) 220 MG tablet Take by mouth.  Vladimir Faster Glycol-Propyl Glycol (SYSTANE) 0.4-0.3 %  GEL ophthalmic gel Place 1 application into both eyes.  Bethann Humble Sulfate (EYE DROPS A/C OP) Apply to eye.  . traZODone (DESYREL) 100 MG tablet TAKE 1 TABLET BY MOUTH EVERY NIGHT AT BEDTIME AS NEEDED FOR INSOMNIA  . [DISCONTINUED] calcium-vitamin D (OSCAL WITH D) 250-125 MG-UNIT per tablet Take by mouth.  . [DISCONTINUED] Docusate Sodium (STOOL SOFTENER) 100 MG capsule STOOL SOFTENER, 100MG  (Oral Tablet)  1 Every Day for 0 days  Quantity: 0.00;  Refills: 0   Ordered :06-Nov-2009  Meyer Cory ;  Started 29-Apr-2009 Active    Review of Systems  Constitutional: Negative for fever, chills, appetite change and fatigue.  Respiratory: Positive for cough, shortness of breath and wheezing. Negative for chest tightness.   Cardiovascular: Positive for leg swelling (in both feet). Negative for chest pain and palpitations.  Gastrointestinal: Negative for nausea, vomiting and abdominal pain.  Neurological: Negative for dizziness and weakness.    Social History  Substance Use Topics  . Smoking status: Never Smoker   . Smokeless tobacco: Not on file  . Alcohol Use: Yes     Comment: Occasional alcohol use   Objective:   BP 178/90 mmHg  Pulse 99  Temp(Src) 98.3 F (36.8 C) (Oral)  Resp 18  Wt 215 lb (97.523 kg)  SpO2 97%  Physical Exam   General Appearance:    Alert, cooperative, no distress  Eyes:  PERRL, conjunctiva/corneas clear, EOM's intact       Lungs:     Clear to auscultation bilaterally, respirations unlabored  Heart:    Regular rhythm. Tachycardic. III/VI systolic murmur RUSB and LUSB. 2+ bipedal edema.   Neurologic:   Awake, alert, oriented x 3. No apparent focal neurological           defect.       EKG-Tachycardia    Assessment & Plan:     1. Heart murmur  - DG Chest 2 View; Future - EKG 12-Lead  2. Dyspnea on exertion Rule out CHF. Likely component of uncontrolled asthma below. No sign of respiratory infection - DG Chest 2 View; Future - EKG  12-Lead  3. Intermittent asthma, uncontrolled, uncomplicated Has used up Advair, but she doesn't think it helped much anyway. Given sample Breo 200 with instructions of how to use once a day. Consider prednisone, consider furosemide. Awaiting results of chest XR.   Dg Chest 2 View  07/14/2015  CLINICAL DATA:  Wheezing cough and shortness of breath consistent with an asthma flare up for the past 12 days   EXAM: CHEST  2 VIEW COMPARISON:  Report only of a chest x-ray of February 04, 1996.   FINDINGS: The lungs are mildly hyperinflated with mild hemidiaphragm flattening. There is a trace of fluid blunting the right lateral and posterior costophrenic angle. The heart is mildly enlarged. The pulmonary vascularity is not engorged. There is faint calcification in the wall of the aortic arch. The mediastinum is normal in width. There is multilevel degenerative disc disease of the thoracic spine.   IMPRESSION: 1. Hyperinflation consistent with known reactive airway disease. There is no alveolar pneumonia. 2. Mild cardiomegaly. Small right-sided pleural effusion. This could reflect low-grade compensated CHF. There is no definite interstitial edema currently. Without recent chest radiographs it is difficult to judge whether there has been acute change. Electronically Signed   By: David  Martinique M.D.   On: 07/14/2015 16:40   Start furosemide 20mg  and prednisone 20mg  bid for 5 days. Scheduled for echo and start ECASA 81mg  daily.       Lelon Huh, MD  [facility]

## 2015-07-14 NOTE — Patient Instructions (Signed)
Go to the [facility] on [address] for Chest Xray  

## 2015-07-15 NOTE — Telephone Encounter (Signed)
Pt informed and voiced understanding of results. 

## 2015-07-19 DIAGNOSIS — E113412 Type 2 diabetes mellitus with severe nonproliferative diabetic retinopathy with macular edema, left eye: Secondary | ICD-10-CM | POA: Diagnosis not present

## 2015-07-21 ENCOUNTER — Ambulatory Visit: Payer: Medicare Other

## 2015-07-23 DIAGNOSIS — H34831 Tributary (branch) retinal vein occlusion, right eye, with macular edema: Secondary | ICD-10-CM | POA: Diagnosis not present

## 2015-08-12 ENCOUNTER — Telehealth: Payer: Self-pay

## 2015-08-12 ENCOUNTER — Other Ambulatory Visit: Payer: Self-pay | Admitting: Family Medicine

## 2015-08-12 NOTE — Telephone Encounter (Signed)
Patient would like to have echo scheduled for sometime next week.

## 2015-08-12 NOTE — Telephone Encounter (Signed)
Patient reports that she has been taking her furosemide 20 mg as prescribed. Patient reports that she has not seen improvement with swelling. Patient reports being concerned about urinating every 2 hours even at night. Patient would like to know if she still needs to continue the Furosemide or is there something is that can be taken for the swelling. Please advise. sd CB# [phone]

## 2015-08-12 NOTE — Telephone Encounter (Signed)
She needs echocardiogram for evaluation of swelling and dyspnea. Please advised patient and forward to sarah to schedule. Patient needs to schedule office visit day after echo is scheduled.

## 2015-08-13 NOTE — Telephone Encounter (Signed)
Appointment scheduled at [facility] for echo 08/19/15 at 11:00

## 2015-08-19 ENCOUNTER — Ambulatory Visit
Admission: RE | Admit: 2015-08-19 | Discharge: 2015-08-19 | Disposition: A | Discharge status: Home / Self Care | Payer: Medicare Other | Source: Ambulatory Visit | Attending: Family Medicine | Admitting: Family Medicine

## 2015-08-19 DIAGNOSIS — R011 Cardiac murmur, unspecified: Secondary | ICD-10-CM | POA: Insufficient documentation

## 2015-08-19 DIAGNOSIS — R0609 Other forms of dyspnea: Secondary | ICD-10-CM | POA: Insufficient documentation

## 2015-08-19 DIAGNOSIS — I517 Cardiomegaly: Secondary | ICD-10-CM | POA: Diagnosis not present

## 2015-08-19 NOTE — Progress Notes (Signed)
*  PRELIMINARY RESULTS* Echocardiogram 2D Echocardiogram has been performed.  Sherrie Sport 08/19/2015, 11:59 AM

## 2015-08-22 ENCOUNTER — Other Ambulatory Visit: Payer: Self-pay | Admitting: Family Medicine

## 2015-08-24 ENCOUNTER — Telehealth: Payer: Self-pay

## 2015-08-24 ENCOUNTER — Encounter: Payer: Self-pay | Admitting: Family Medicine

## 2015-08-24 DIAGNOSIS — I517 Cardiomegaly: Secondary | ICD-10-CM | POA: Insufficient documentation

## 2015-08-24 DIAGNOSIS — I10 Essential (primary) hypertension: Secondary | ICD-10-CM

## 2015-08-24 MED ORDER — LISINOPRIL 40 MG PO TABS: 40.0000 mg | ORAL_TABLET | Freq: Every day | ORAL | 0 refills | Status: DC

## 2015-08-24 NOTE — Telephone Encounter (Signed)
Pt advised; RX sent to [facility].    Thanks,   Mickel Baas

## 2015-08-24 NOTE — Telephone Encounter (Signed)
-----   Message from Birdie Sons, MD sent at 08/24/2015  8:04 AM EDT ----- Echo shows damage to her aortic valve, which is causing her to retain fluid and making her short of breath. She needs to get blood pressure down to protect valve. Need to increase lisinopril to 40mg  daily, #30, rf x 0. Need to schedule follow up office visit in 2-3 weeks.

## 2015-08-27 DIAGNOSIS — H34831 Tributary (branch) retinal vein occlusion, right eye, with macular edema: Secondary | ICD-10-CM | POA: Diagnosis not present

## 2015-08-30 ENCOUNTER — Other Ambulatory Visit: Payer: Self-pay | Admitting: Family Medicine

## 2015-09-07 DIAGNOSIS — E113412 Type 2 diabetes mellitus with severe nonproliferative diabetic retinopathy with macular edema, left eye: Secondary | ICD-10-CM | POA: Diagnosis not present

## 2015-09-08 ENCOUNTER — Ambulatory Visit (INDEPENDENT_AMBULATORY_CARE_PROVIDER_SITE_OTHER): Payer: Medicare Other | Admitting: Family Medicine

## 2015-09-08 ENCOUNTER — Encounter: Payer: Self-pay | Admitting: Family Medicine

## 2015-09-08 VITALS — BP 156/90 | HR 84 | Temp 97.5°F | Resp 16 | Ht 61.0 in | Wt 208.0 lb

## 2015-09-08 DIAGNOSIS — I10 Essential (primary) hypertension: Secondary | ICD-10-CM

## 2015-09-08 DIAGNOSIS — Z23 Encounter for immunization: Secondary | ICD-10-CM

## 2015-09-08 DIAGNOSIS — J4520 Mild intermittent asthma, uncomplicated: Secondary | ICD-10-CM

## 2015-09-08 DIAGNOSIS — R350 Frequency of micturition: Secondary | ICD-10-CM | POA: Diagnosis not present

## 2015-09-08 DIAGNOSIS — I080 Rheumatic disorders of both mitral and aortic valves: Secondary | ICD-10-CM | POA: Diagnosis not present

## 2015-09-08 LAB — POCT URINALYSIS DIPSTICK
Bilirubin, UA: NEGATIVE
GLUCOSE UA: NEGATIVE
Ketones, UA: NEGATIVE
NITRITE UA: NEGATIVE
PH UA: 5
PROTEIN UA: POSITIVE
RBC UA: NEGATIVE
SPEC GRAV UA: 1.025
UROBILINOGEN UA: 0.2

## 2015-09-08 MED ORDER — DILTIAZEM HCL ER 120 MG PO CP24: 120.0000 mg | ORAL_CAPSULE | Freq: Every day | ORAL | 1 refills | Status: DC

## 2015-09-08 MED ORDER — FUROSEMIDE 20 MG PO TABS: 20.0000 mg | ORAL_TABLET | Freq: Every day | ORAL | 1 refills | Status: DC | PRN

## 2015-09-08 MED ORDER — FLUTICASONE FUROATE-VILANTEROL 200-25 MCG/INH IN AEPB: 1.0000 | INHALATION_SPRAY | Freq: Every day | RESPIRATORY_TRACT | 5 refills | Status: DC

## 2015-09-08 MED ORDER — LISINOPRIL 40 MG PO TABS: 40.0000 mg | ORAL_TABLET | Freq: Every day | ORAL | 1 refills | Status: DC

## 2015-09-08 MED ORDER — LEVALBUTEROL HCL 1.25 MG/3ML IN NEBU: 1.2500 mg | INHALATION_SOLUTION | Freq: Three times a day (TID) | RESPIRATORY_TRACT | 3 refills | Status: DC | PRN

## 2015-09-08 NOTE — Progress Notes (Signed)
Patient: [name] Female    DOB: 25-Nov-1945   70 y.o.   MRN: [id number] Visit Date: 09/08/2015  Today's Provider: Lelon Huh, MD   Chief Complaint  Patient presents with  . Hypertension   Subjective:    HPI  Hypertension, follow-up:  BP Readings from Last 3 Encounters:  09/08/15 (!) 156/90  07/14/15 (!) 178/90  06/25/15 (!) 150/100    She was last seen for hypertension 3 weeks ago.  BP at that visit was. Management changes since that visit include Lisinopril increased to 40 mg daily. She reports excellent compliance with treatment. She is not having side effects.  She is not exercising. She is adherent to low salt diet.   Outside blood pressures are . She is experiencing lower extremity edema.  Patient denies chest pain.   Cardiovascular risk factors include advanced age (older than 31 for men, 54 for women), diabetes mellitus, hypertension and obesity (BMI >= 30 kg/m2).  Use of agents associated with hypertension: none.     Weight trend: stable Wt Readings from Last 3 Encounters:  09/08/15 208 lb (94.3 kg)  07/14/15 215 lb (97.5 kg)  06/25/15 207 lb (93.9 kg)    Current diet: not asked  ------------------------------------------------------------------------  She had echo since last visit showing LVH and aortic stenosis prompting increasing dose of lisinopril which she is tolerating well. She was also given samples of Breo and she states her breathing has greatly improved. She took furosemide for several days which helped with swelling but she stopped due to having to void every 2 hours. Her breathing has not worsened since stopping furosemide, but she continues to have frequent urination.   She was also prescribed trazodone and states she falls asleep easily now, but wakes after a few hours and has trouble falling back to sleep.      Allergies  Allergen Reactions  . Hydrochlorothiazide     Leg Cramps  . Penicillins Rash     Current  Outpatient Prescriptions:  .  albuterol (PROAIR HFA) 108 (90 Base) MCG/ACT inhaler, Inhale 2 puffs into the lungs every 6 (six) hours as needed., Disp: 18 Inhaler, Rfl: 3 .  fluticasone-salmeterol (ADVAIR HFA) 115-21 MCG/ACT inhaler, Inhale 2 puffs into the lungs 2 (two) times daily., Disp: 1 Inhaler, Rfl: 12 .  levalbuterol (XOPENEX) 1.25 MG/3ML nebulizer solution, Take 1.25 mg by nebulization every 8 (eight) hours as needed for wheezing., Disp: 72 mL, Rfl: 3 .  lisinopril (PRINIVIL,ZESTRIL) 40 MG tablet, Take 1 tablet (40 mg total) by mouth daily., Disp: 30 tablet, Rfl: 0 .  lovastatin (MEVACOR) 20 MG tablet, TAKE 1 TABLET BY MOUTH AT BEDTIME, Disp: 90 tablet, Rfl: 4 .  meloxicam (MOBIC) 15 MG tablet, Take 1 tablet (15 mg total) by mouth daily as needed for pain. Take with food, Disp: 30 tablet, Rfl: 2 .  metFORMIN (GLUCOPHAGE-XR) 750 MG 24 hr tablet, Take 2 tablets (1,500 mg total) by mouth daily., Disp: 60 tablet, Rfl: 12 .  montelukast (SINGULAIR) 10 MG tablet, TAKE 1 TABLET (10 MG TOTAL) BY MOUTH DAILY. FOR CHRONIC COUGH, Disp: 30 tablet, Rfl: 3 .  NIFEdipine (PROCARDIA-XL/ADALAT CC) 30 MG 24 hr tablet, Take by mouth. Reported on 07/14/2015, Disp: , Rfl:  .  Polyethyl Glycol-Propyl Glycol (SYSTANE) 0.4-0.3 % GEL ophthalmic gel, Place 1 application into both eyes., Disp: , Rfl:  .  Tetrahydrozoline-Zn Sulfate (EYE DROPS A/C OP), Apply to eye., Disp: , Rfl:  .  traZODone (DESYREL) 100  MG tablet, TAKE 1 TABLET BY MOUTH EVERY NIGHT AT BEDTIME AS NEEDED FOR INSOMNIA, Disp: 90 tablet, Rfl: 4  Review of Systems  Constitutional: Negative for appetite change, chills, fatigue and fever.  Respiratory: Negative for chest tightness and shortness of breath.   Cardiovascular: Negative for chest pain and palpitations.  Gastrointestinal: Negative for abdominal pain, nausea and vomiting.  Neurological: Negative for dizziness and weakness.    Social History  Substance Use Topics  . Smoking status: Never  Smoker  . Smokeless tobacco: Never Used  . Alcohol use Yes     Comment: Occasional alcohol use   Objective:   BP (!) 156/90 (BP Location: Left Arm, Patient Position: Sitting, Cuff Size: Large)   Pulse 84   Temp 97.5 F (36.4 C) (Oral)   Resp 16   Ht 5\' 1"  (1.549 m)   Wt 208 lb (94.3 kg)   SpO2 97%   BMI 39.30 kg/m   Physical Exam  General Appearance:    Alert, cooperative, no distress, obese  Eyes:    PERRL, conjunctiva/corneas clear, EOM's intact       Lungs:     Clear to auscultation bilaterally, respirations unlabored  Heart:    Regular rate and rhythm, III/VI systolic murmur. 2+ bipedal edema  Neurologic:   Awake, alert, oriented x 3. No apparent focal neurological           defect.       Results for orders placed or performed in visit on 09/08/15  POCT urinalysis dipstick  Result Value Ref Range   Color, UA yellow    Clarity, UA clear    Glucose, UA negative    Bilirubin, UA negative    Ketones, UA negative    Spec Grav, UA 1.025    Blood, UA negative    pH, UA 5.0    Protein, UA positive    Urobilinogen, UA 0.2    Nitrite, UA negative    Leukocytes, UA Trace (A) Negative        Assessment & Plan:     1. Essential (primary) hypertension Improved on increased dose of lisinopril. Change nifedipine to diltiazem 24 hr 120 daily. - lisinopril (PRINIVIL,ZESTRIL) 40 MG tablet; Take 1 tablet (40 mg total) by mouth daily.  Dispense: 30 tablet; Refill: 1  2. Need for influenza vaccination  - Flu vaccine HIGH DOSE PF  3. Intermittent asthma, uncontrolled, uncomplicated Much better with Memory [facility] which is refilled today.  - fluticasone furoate-vilanterol (BREO ELLIPTA) 200-25 MCG/INH AEPB; Inhale 1 puff into the lungs daily. FOR ASTHMA J45.20  Dispense: 1 each; Refill: 5  4. Mitral valve insufficiency and aortic valve stenosis Continue to work on optimizing BP as above, DM and cholesterol. Advised to take furosemide prn for swelling or shortness of breath.   5.  Frequent urination  - POCT urinalysis dipstick  Return in about 1 month (around 10/08/2015). (blood pressure check)     The entirety of the information documented in the History of Present Illness, Review of Systems and Physical Exam were personally obtained by me. Portions of this information were initially documented by Lynford Humphrey, CMA and reviewed by me for thoroughness and accuracy.    Lelon Huh, MD  [facility]

## 2015-09-10 ENCOUNTER — Other Ambulatory Visit: Payer: Self-pay | Admitting: Family Medicine

## 2015-09-11 ENCOUNTER — Other Ambulatory Visit: Payer: Self-pay | Admitting: Family Medicine

## 2015-09-14 ENCOUNTER — Encounter: Payer: Self-pay | Admitting: Family Medicine

## 2015-09-28 DIAGNOSIS — E113413 Type 2 diabetes mellitus with severe nonproliferative diabetic retinopathy with macular edema, bilateral: Secondary | ICD-10-CM | POA: Diagnosis not present

## 2015-10-06 ENCOUNTER — Ambulatory Visit: Payer: Self-pay | Admitting: Family Medicine

## 2015-10-06 NOTE — Progress Notes (Deleted)
   Patient: [name] Female    DOB: Jul 22, 1945   70 y.o.   MRN: [id number] Visit Date: 10/06/2015  Today's Provider: Lelon Huh, MD   No chief complaint on file.  Subjective:    HPI  Hypertension, follow-up:  BP Readings from Last 3 Encounters:  09/08/15 (!) 156/90  07/14/15 (!) 178/90  06/25/15 (!) 150/100    She was last seen for hypertension 1 months ago.  BP at that visit was 156/90. Management changes since that visit include changed Nifedipine to Diltiazem 120 mg 24 hr . She reports excellent compliance with treatment. She is not having side effects.  She is not exercising. She is adherent to low salt diet.   Outside blood pressures are ***. She is experiencing none.  Patient denies chest pain.   Cardiovascular risk factors include advanced age (older than 43 for men, 62 for women), diabetes mellitus, hypertension and obesity (BMI >= 30 kg/m2).  Use of agents associated with hypertension: none.     Weight trend: stable Wt Readings from Last 3 Encounters:  09/08/15 208 lb (94.3 kg)  07/14/15 215 lb (97.5 kg)  06/25/15 207 lb (93.9 kg)    Current diet: {diet habits:16563}  ------------------------------------------------------------------------    Previous Medications   ALBUTEROL (PROAIR HFA) 108 (90 BASE) MCG/ACT INHALER    Inhale 2 puffs into the lungs every 6 (six) hours as needed.   DILTIAZEM (DILACOR XR) 120 MG 24 HR CAPSULE    Take 1 capsule (120 mg total) by mouth daily. REPLACES NIFEDIPINE   FLUTICASONE FUROATE-VILANTEROL (BREO ELLIPTA) 200-25 MCG/INH AEPB    Inhale 1 puff into the lungs daily. FOR ASTHMA J45.20   FUROSEMIDE (LASIX) 20 MG TABLET    Take 1 tablet (20 mg total) by mouth daily as needed for fluid.   LEVALBUTEROL (XOPENEX) 1.25 MG/3ML NEBULIZER SOLUTION    Take 1.25 mg by nebulization every 8 (eight) hours as needed for wheezing.   LISINOPRIL (PRINIVIL,ZESTRIL) 40 MG TABLET    Take 1 tablet (40 mg total) by mouth daily.   LOVASTATIN  (MEVACOR) 20 MG TABLET    TAKE 1 TABLET BY MOUTH AT BEDTIME   MELOXICAM (MOBIC) 15 MG TABLET    TAKE 1 TABLET (15 MG TOTAL) BY MOUTH DAILY AS NEEDED FOR PAIN. TAKE WITH FOOD   METFORMIN (GLUCOPHAGE-XR) 750 MG 24 HR TABLET    Take 2 tablets (1,500 mg total) by mouth daily.   MONTELUKAST (SINGULAIR) 10 MG TABLET    TAKE 1 TABLET (10 MG TOTAL) BY MOUTH DAILY. FOR CHRONIC COUGH   POLYETHYL GLYCOL-PROPYL GLYCOL (SYSTANE) 0.4-0.3 % GEL OPHTHALMIC GEL    Place 1 application into both eyes.   TETRAHYDROZOLINE-ZN SULFATE (EYE DROPS A/C OP)    Apply to eye.   TRAZODONE (DESYREL) 100 MG TABLET    TAKE 1 TABLET BY MOUTH EVERY NIGHT AT BEDTIME AS NEEDED FOR INSOMNIA    Review of Systems  Constitutional: Negative.   Respiratory: Negative.   Cardiovascular: Negative.     Social History  Substance Use Topics  . Smoking status: Never Smoker  . Smokeless tobacco: Never Used  . Alcohol use Yes     Comment: Occasional alcohol use   Objective:   There were no vitals taken for this visit.  Physical Exam      Assessment & Plan:       Follow up: No Follow-up on file.

## 2015-10-22 ENCOUNTER — Ambulatory Visit (INDEPENDENT_AMBULATORY_CARE_PROVIDER_SITE_OTHER): Payer: Medicare Other | Admitting: Family Medicine

## 2015-10-22 ENCOUNTER — Ambulatory Visit: Payer: Self-pay | Admitting: Family Medicine

## 2015-10-22 ENCOUNTER — Encounter: Payer: Self-pay | Admitting: Family Medicine

## 2015-10-22 VITALS — BP 158/88 | HR 72 | Temp 97.5°F | Resp 16

## 2015-10-22 DIAGNOSIS — E1121 Type 2 diabetes mellitus with diabetic nephropathy: Secondary | ICD-10-CM | POA: Diagnosis not present

## 2015-10-22 DIAGNOSIS — I10 Essential (primary) hypertension: Secondary | ICD-10-CM | POA: Diagnosis not present

## 2015-10-22 DIAGNOSIS — E7800 Pure hypercholesterolemia, unspecified: Secondary | ICD-10-CM | POA: Diagnosis not present

## 2015-10-22 DIAGNOSIS — J4520 Mild intermittent asthma, uncomplicated: Secondary | ICD-10-CM

## 2015-10-22 DIAGNOSIS — I517 Cardiomegaly: Secondary | ICD-10-CM | POA: Diagnosis not present

## 2015-10-22 LAB — POCT GLYCOSYLATED HEMOGLOBIN (HGB A1C)
ESTIMATED AVERAGE GLUCOSE: 143
HEMOGLOBIN A1C: 6.6

## 2015-10-22 MED ORDER — LOVASTATIN 20 MG PO TABS: 20.0000 mg | ORAL_TABLET | Freq: Every day | ORAL | 4 refills | Status: DC

## 2015-10-22 MED ORDER — DILTIAZEM HCL ER 180 MG PO CP24: 180.0000 mg | ORAL_CAPSULE | Freq: Every day | ORAL | 3 refills | Status: DC

## 2015-10-22 NOTE — Progress Notes (Signed)
Patient: [name] Female    DOB: May 17, 1945   70 y.o.   MRN: [id number] Visit Date: 10/22/2015  Today's Provider: Lelon Huh, MD   Chief Complaint  Patient presents with  . Diabetes  . Hypertension  . Asthma   Subjective:    HPI  Diabetes Mellitus Type II, Follow-up:   Lab Results  Component Value Date   HGBA1C 6.6 10/22/2015   HGBA1C 6.4 06/25/2015   HGBA1C 6.7 03/02/2015    Last seen for diabetes 4 months ago.  Management since then includes no changes. She reports good compliance with treatment. She is not having side effects.  Current symptoms include none and have been stable. Home blood sugar records: not being checked  Episodes of hypoglycemia? no   Most Recent Eye Exam:  Weight trend: stable Prior visit with dietician: no Current diet: well balanced Current exercise: none  Pertinent Labs:    Component Value Date/Time   CHOL 139 06/25/2015 0959   TRIG 64 06/25/2015 0959   HDL 59 06/25/2015 0959   LDLCALC 67 06/25/2015 0959   CREATININE 0.75 06/25/2015 1000    Wt Readings from Last 3 Encounters:  09/08/15 208 lb (94.3 kg)  07/14/15 215 lb (97.5 kg)  06/25/15 207 lb (93.9 kg)    ------------------------------------------------------------------------   Hypertension, follow-up:  BP Readings from Last 3 Encounters:  10/22/15 (!) 158/88  09/08/15 (!) 156/90  07/14/15 (!) 178/90    She was last seen for hypertension 1 months ago.  BP at that visit was 156/90. Management since that visit includes changed Nifedipine to Diltiazem 120mg  capsule for better BP control. She had echo prior to last visit finding aortic stenosis and LVH She reports good compliance with treatment. She is not having side effects.  She is not exercising. She is adherent to low salt diet.   Outside blood pressures are not being checked.  Patient denies chest pressure/discomfort, exertional chest pressure/discomfort, palpitations and tachypnea.     Cardiovascular risk factors include diabetes mellitus and dyslipidemia.   Weight trend: stable Wt Readings from Last 3 Encounters:  09/08/15 208 lb (94.3 kg)  07/14/15 215 lb (97.5 kg)  06/25/15 207 lb (93.9 kg)    Current diet: well balanced  ------------------------------------------------------------------------  Asthma, follow up:  Patient comes in today for a follow up on asthma. She was last seen in the office on 09/08/2015, and no changes were made. States breathing has improved significantly over the last few months.   States she falls asleep at night for two hours, then can't get back to sleep Swelling has improved and is no longer taking furosemide.     Allergies  Allergen Reactions  . Hydrochlorothiazide     Leg Cramps  . Penicillins Rash     Current Outpatient Prescriptions:  .  albuterol (PROAIR HFA) 108 (90 Base) MCG/ACT inhaler, Inhale 2 puffs into the lungs every 6 (six) hours as needed., Disp: 18 Inhaler, Rfl: 3 .  fluticasone furoate-vilanterol (BREO ELLIPTA) 200-25 MCG/INH AEPB, Inhale 1 puff into the lungs daily. FOR ASTHMA J45.20, Disp: 1 each, Rfl: 5 .  furosemide (LASIX) 20 MG tablet, Take 1 tablet (20 mg total) by mouth daily as needed for fluid., Disp: 1 tablet, Rfl: 1 .  levalbuterol (XOPENEX) 1.25 MG/3ML nebulizer solution, Take 1.25 mg by nebulization every 8 (eight) hours as needed for wheezing., Disp: 72 mL, Rfl: 3 .  lisinopril (PRINIVIL,ZESTRIL) 40 MG tablet, Take 1 tablet (40 mg total) by  mouth daily., Disp: 30 tablet, Rfl: 1 .  meloxicam (MOBIC) 15 MG tablet, TAKE 1 TABLET (15 MG TOTAL) BY MOUTH DAILY AS NEEDED FOR PAIN. TAKE WITH FOOD, Disp: 30 tablet, Rfl: 5 .  metFORMIN (GLUCOPHAGE-XR) 750 MG 24 hr tablet, Take 2 tablets (1,500 mg total) by mouth daily., Disp: 60 tablet, Rfl: 12 .  montelukast (SINGULAIR) 10 MG tablet, TAKE 1 TABLET (10 MG TOTAL) BY MOUTH DAILY. FOR CHRONIC COUGH, Disp: 30 tablet, Rfl: 3 .  Polyethyl Glycol-Propyl Glycol  (SYSTANE) 0.4-0.3 % GEL ophthalmic gel, Place 1 application into both eyes., Disp: , Rfl:  .  Tetrahydrozoline-Zn Sulfate (EYE DROPS A/C OP), Apply to eye., Disp: , Rfl:  .  traZODone (DESYREL) 100 MG tablet, TAKE 1 TABLET BY MOUTH EVERY NIGHT AT BEDTIME AS NEEDED FOR INSOMNIA, Disp: 90 tablet, Rfl: 4 .  diltiazem (DILACOR XR) 120 MG 24 hr capsule, Take 1 capsule (120 mg total) by mouth daily. REPLACES NIFEDIPINE (Was dispensed at [facility] on [facility]/08[facility]15 and 10/11/15. Patient states she has more medications at home that she is taking. Reported on 10/22/2015), Disp: 30 capsule, Rfl: 1 .  lovastatin (MEVACOR) 20 MG tablet, TAKE 1 TABLET BY MOUTH AT BEDTIME (Patient not sure if she is taking: Was dispensed from pharmacy Reported on 10/22/2015), Disp: 90 tablet, Rfl: 4  Review of Systems  Constitutional: Negative.   Respiratory: Positive for shortness of breath. Negative for apnea, cough, choking, chest tightness, wheezing and stridor.        Has asthma.   Cardiovascular: Positive for leg swelling. Negative for chest pain and palpitations.  Endocrine: Negative.   Musculoskeletal: Positive for arthralgias, gait problem and joint swelling.    Social History  Substance Use Topics  . Smoking status: Never Smoker  . Smokeless tobacco: Never Used  . Alcohol use Yes     Comment: Occasional alcohol use   Objective:   BP (!) 158/88 (BP Location: Right Arm, Patient Position: Sitting, Cuff Size: Normal)   Pulse 72   Temp 97.5 F (36.4 C)   Resp 16   Physical Exam   Results for orders placed or performed in visit on 10/22/15  POCT glycosylated hemoglobin (Hb A1C)  Result Value Ref Range   Hemoglobin A1C 6.6    Est. average glucose Bld gHb Est-mCnc 143        Assessment & Plan:     1. Essential (primary) hypertension Tolerating diltiazem well. Counseled on importance of good blood pressure considering [place] and aortic sclerosis. Increase from 120 to 180mg  diltiazem a day.  - diltiazem (DILACOR XR) 180  MG 24 hr capsule; Take 1 capsule (180 mg total) by mouth daily.   Dispense: 30 capsule; Refill: 3  2. Intermittent asthma, uncontrolled Well controlled on current inhalers.   3. Diabetes mellitus with nephropathy (HCC) Well controlled.  Continue current medications.   - POCT glycosylated hemoglobin (Hb A1C)  4. Pure hypercholesterolemia She is tolerating lovastatin well with no adverse effects.    Return in about 3 months (around 01/22/2016).   The entirety of the information documented in the History of Present Illness, Review of Systems and Physical Exam were personally obtained by me. Portions of this information were initially documented by Wilburt Finlay, CMA and reviewed by me for thoroughness and accuracy.        Lelon Huh, MD  [facility]

## 2015-10-25 DIAGNOSIS — E113413 Type 2 diabetes mellitus with severe nonproliferative diabetic retinopathy with macular edema, bilateral: Secondary | ICD-10-CM | POA: Diagnosis not present

## 2015-11-01 DIAGNOSIS — E113413 Type 2 diabetes mellitus with severe nonproliferative diabetic retinopathy with macular edema, bilateral: Secondary | ICD-10-CM | POA: Diagnosis not present

## 2015-11-09 ENCOUNTER — Other Ambulatory Visit: Payer: Self-pay | Admitting: Family Medicine

## 2015-11-09 DIAGNOSIS — I10 Essential (primary) hypertension: Secondary | ICD-10-CM

## 2015-12-13 DIAGNOSIS — E113413 Type 2 diabetes mellitus with severe nonproliferative diabetic retinopathy with macular edema, bilateral: Secondary | ICD-10-CM | POA: Diagnosis not present

## 2015-12-28 ENCOUNTER — Other Ambulatory Visit: Payer: Self-pay

## 2015-12-28 MED ORDER — MONTELUKAST SODIUM 10 MG PO TABS: 10.0000 mg | ORAL_TABLET | Freq: Every day | ORAL | 4 refills | Status: DC

## 2015-12-28 NOTE — Telephone Encounter (Signed)
Last ov 10/22/15; last filled 08/22/15. Pharmacy requesting 90 day supply. Please review. Thank you. sd

## 2016-01-04 DIAGNOSIS — E113413 Type 2 diabetes mellitus with severe nonproliferative diabetic retinopathy with macular edema, bilateral: Secondary | ICD-10-CM | POA: Diagnosis not present

## 2016-01-25 ENCOUNTER — Other Ambulatory Visit: Payer: Self-pay | Admitting: *Deleted

## 2016-01-25 ENCOUNTER — Other Ambulatory Visit: Payer: Self-pay | Admitting: Family Medicine

## 2016-01-25 ENCOUNTER — Encounter: Payer: Self-pay | Admitting: Family Medicine

## 2016-01-25 ENCOUNTER — Ambulatory Visit (INDEPENDENT_AMBULATORY_CARE_PROVIDER_SITE_OTHER): Payer: Medicare Other | Admitting: Family Medicine

## 2016-01-25 VITALS — BP 140/100 | HR 86 | Temp 97.6°F | Resp 16 | Ht 61.0 in | Wt 227.0 lb

## 2016-01-25 DIAGNOSIS — R609 Edema, unspecified: Secondary | ICD-10-CM | POA: Diagnosis not present

## 2016-01-25 DIAGNOSIS — E1121 Type 2 diabetes mellitus with diabetic nephropathy: Secondary | ICD-10-CM | POA: Diagnosis not present

## 2016-01-25 DIAGNOSIS — D649 Anemia, unspecified: Secondary | ICD-10-CM | POA: Insufficient documentation

## 2016-01-25 DIAGNOSIS — R0609 Other forms of dyspnea: Secondary | ICD-10-CM

## 2016-01-25 DIAGNOSIS — D472 Monoclonal gammopathy: Secondary | ICD-10-CM | POA: Insufficient documentation

## 2016-01-25 DIAGNOSIS — R3589 Other polyuria: Secondary | ICD-10-CM

## 2016-01-25 DIAGNOSIS — R358 Other polyuria: Secondary | ICD-10-CM

## 2016-01-25 DIAGNOSIS — I10 Essential (primary) hypertension: Secondary | ICD-10-CM

## 2016-01-25 LAB — POCT GLYCOSYLATED HEMOGLOBIN (HGB A1C)
ESTIMATED AVERAGE GLUCOSE: 143
HEMOGLOBIN A1C: 6.6

## 2016-01-25 LAB — POCT URINALYSIS DIPSTICK
Bilirubin, UA: NEGATIVE
Glucose, UA: NEGATIVE
KETONES UA: NEGATIVE
Nitrite, UA: NEGATIVE
PH UA: 7.5
SPEC GRAV UA: 1.015
UROBILINOGEN UA: 0.2

## 2016-01-25 MED ORDER — FUROSEMIDE 20 MG PO TABS: 20.0000 mg | ORAL_TABLET | Freq: Every day | ORAL | 3 refills | Status: DC | PRN

## 2016-01-25 MED ORDER — DILTIAZEM HCL ER 120 MG PO CP24: 120.0000 mg | ORAL_CAPSULE | Freq: Every day | ORAL | 1 refills | Status: DC

## 2016-01-25 MED ORDER — MELOXICAM 15 MG PO TABS: 15.0000 mg | ORAL_TABLET | Freq: Every day | ORAL | 4 refills | Status: DC | PRN

## 2016-01-25 MED ORDER — METFORMIN HCL ER 750 MG PO TB24: 1500.0000 mg | ORAL_TABLET | Freq: Every day | ORAL | 4 refills | Status: DC

## 2016-01-25 MED ORDER — MELOXICAM 15 MG PO TABS: 15.0000 mg | ORAL_TABLET | Freq: Every day | ORAL | 5 refills | Status: DC | PRN

## 2016-01-25 MED ORDER — MUPIROCIN 2 % EX OINT: 1.0000 "application " | TOPICAL_OINTMENT | Freq: Two times a day (BID) | CUTANEOUS | 0 refills | Status: DC

## 2016-01-25 MED ORDER — MONTELUKAST SODIUM 10 MG PO TABS: 10.0000 mg | ORAL_TABLET | Freq: Every day | ORAL | 4 refills | Status: DC

## 2016-01-25 NOTE — Patient Instructions (Addendum)
   You should wear a pair of compression stocking during the day. These are available at pharmacies, but can by custom fitted if you get some at a medical supply store.      Keep your legs elevated at least to level of your hip whenever you are not walking   You can use OTC Biotene to help with dry mout

## 2016-01-25 NOTE — Progress Notes (Signed)
Patient: [name] Female    DOB: 05/02/1945   71 y.o.   MRN: [id number] Visit Date: 01/25/2016  Today's Provider: Lelon Huh, MD   Chief Complaint  Patient presents with  . Follow-up  . Diabetes  . Hypertension  . Hyperlipidemia  . Asthma   Subjective:    Both legs have been swelling and draining for almost 3 weeks. Patient has had lesion on both legs. Left leg lesions have cleared up, but right leg still has lesions that are draining. Patient has been cleaning lesions with peroxide. Patient has also been having increased urine flow. Patient stated that she has to get up 2-3 times a night to urinate, not always making it to the batthroom.   Has been out of furosemide for a few months, but is having to go to bathroom to void all the time, having trouble making to the restroom to go.   Intermittent asthma, uncontrolled From 10/22/2015-Well controlled on current inhalers.    Diabetes Mellitus Type II, Follow-up:   Lab Results  Component Value Date   HGBA1C 6.6 10/22/2015   HGBA1C 6.4 06/25/2015   HGBA1C 6.7 03/02/2015   Last seen for diabetes 3 months ago.  Management since then includes; no changes. She reports good compliance with treatment. She is not having side effects. none Current symptoms include none and have been unchanged. Home blood sugar records: fasting range: not checking  Episodes of hypoglycemia? no   Current Insulin Regimen: n/a Most Recent Eye Exam: 01/25/15 Weight trend: stable Prior visit with dietician: no Current diet: well balanced Current exercise: none  ----------------------------------------------------------------   Hypertension, follow-up:  BP Readings from Last 3 Encounters:  01/25/16 (!) 140/100  10/22/15 (!) 158/88  09/08/15 (!) 156/90    She was last seen for hypertension 3 months ago.  BP at that visit was 158/88. Management since that visit includes; increased diltiazem from 120 mg to 180 mg qq, But was  refill for 120mg  in November She is not having side effects. none She is not exercising. She is adherent to low salt diet.   Outside blood pressures are not checking. She is experiencing swelling in both legs Patient denies none.   Cardiovascular risk factors include diabetes mellitus.  Use of agents associated with hypertension: none.   ----------------------------------------------------------------    Lipid/Cholesterol, Follow-up:   Last seen for this 3 months ago.  Management since that visit includes; no changes.  Last Lipid Panel:    Component Value Date/Time   CHOL 139 06/25/2015 0959   TRIG 64 06/25/2015 0959   HDL 59 06/25/2015 0959   CHOLHDL 2.4 06/25/2015 0959   LDLCALC 67 06/25/2015 0959    She reports good compliance with treatment. She is not having side effects. none  Wt Readings from Last 3 Encounters:  01/25/16 227 lb (103 kg)  09/08/15 208 lb (94.3 kg)  07/14/15 215 lb (97.5 kg)    ----------------------------------------------------------------    Allergies  Allergen Reactions  . Hydrochlorothiazide     Leg Cramps  . Penicillins Rash     Current Outpatient Prescriptions:  .  albuterol (PROAIR HFA) 108 (90 Base) MCG/ACT inhaler, Inhale 2 puffs into the lungs every 6 (six) hours as needed., Disp: 18 Inhaler, Rfl: 3 .  DILT-XR 120 MG 24 hr capsule, TAKE 1 CAPSULE (120 MG TOTAL) BY MOUTH DAILY. REPLACES NIFEDIPINE, Disp: 30 capsule, Rfl: 6 .  fluticasone furoate-vilanterol (BREO ELLIPTA) 200-25 MCG/INH AEPB, Inhale 1 puff into the  lungs daily. FOR ASTHMA J45.20, Disp: 1 each, Rfl: 5 .  furosemide (LASIX) 20 MG tablet, Take 1 tablet (20 mg total) by mouth daily as needed for fluid., Disp: 1 tablet, Rfl: 1 .  levalbuterol (XOPENEX) 1.25 MG/3ML nebulizer solution, Take 1.25 mg by nebulization every 8 (eight) hours as needed for wheezing., Disp: 72 mL, Rfl: 3 .  lisinopril (PRINIVIL,ZESTRIL) 40 MG tablet, Take 1 tablet (40 mg total) by mouth daily.,  Disp: 30 tablet, Rfl: 1 .  lovastatin (MEVACOR) 20 MG tablet, Take 1 tablet (20 mg total) by mouth at bedtime., Disp: 90 tablet, Rfl: 4 .  meloxicam (MOBIC) 15 MG tablet, TAKE 1 TABLET (15 MG TOTAL) BY MOUTH DAILY AS NEEDED FOR PAIN. TAKE WITH FOOD, Disp: 30 tablet, Rfl: 5 .  metFORMIN (GLUCOPHAGE-XR) 750 MG 24 hr tablet, Take 2 tablets (1,500 mg total) by mouth daily., Disp: 60 tablet, Rfl: 12 .  montelukast (SINGULAIR) 10 MG tablet, Take 1 tablet (10 mg total) by mouth daily. For chronic cough, Disp: 90 tablet, Rfl: 4 .  Polyethyl Glycol-Propyl Glycol (SYSTANE) 0.4-0.3 % GEL ophthalmic gel, Place 1 application into both eyes., Disp: , Rfl:  .  Tetrahydrozoline-Zn Sulfate (EYE DROPS A/C OP), Apply to eye., Disp: , Rfl:  .  traZODone (DESYREL) 100 MG tablet, TAKE 1 TABLET BY MOUTH EVERY NIGHT AT BEDTIME AS NEEDED FOR INSOMNIA, Disp: 90 tablet, Rfl: 4  Review of Systems  Constitutional: Negative for appetite change, chills, fatigue and fever.  Respiratory: Positive for shortness of breath. Negative for chest tightness.   Cardiovascular: Positive for leg swelling. Negative for chest pain and palpitations.  Gastrointestinal: Negative for abdominal pain, nausea and vomiting.  Genitourinary: Positive for frequency and urgency.  Neurological: Negative for dizziness and weakness.    Social History  Substance Use Topics  . Smoking status: Never Smoker  . Smokeless tobacco: Never Used  . Alcohol use Yes     Comment: Occasional alcohol use   Objective:   BP (!) 140/100 (BP Location: Left Arm, Patient Position: Sitting, Cuff Size: Large)   Pulse 86   Temp 97.6 F (36.4 C) (Oral)   Resp 16   Ht 5\' 1"  (1.549 m)   Wt 227 lb (103 kg)   SpO2 96%   BMI 42.89 kg/m   Physical Exam  General Appearance:    Alert, cooperative, no distress, obese  Eyes:    PERRL, conjunctiva/corneas clear, EOM's intact       Lungs:     Clear to auscultation bilaterally, respirations unlabored  Heart:    Regular  rate and rhythm  Neurologic:   Awake, alert, oriented x 3. No apparent focal neurological           defect.   Ext:  3+ bipedal edema with several shallow erosions right anterior lower leg. No surrounding erythema. Scan clear discharge.    Results for orders placed or performed in visit on 01/25/16  POCT glycosylated hemoglobin (Hb A1C)  Result Value Ref Range   Hemoglobin A1C 6.6    Est. average glucose Bld gHb Est-mCnc 143   POCT urinalysis dipstick  Result Value Ref Range   Color, UA Yellow    Clarity, UA Cloudy    Glucose, UA Neg    Bilirubin, UA Neg    Ketones, UA Neg    Spec Grav, UA 1.015    Blood, UA Trace    pH, UA 7.5    Protein, UA 30+    Urobilinogen, UA 0.2  Nitrite, UA Neg    Leukocytes, UA Trace (A) Negative       Assessment & Plan:     .1. Diabetes mellitus with nephropathy ([place]) Well controlled.  Continue current medications.   - POCT glycosylated hemoglobin (Hb A1C)  2. Polyuria Admits to drinking much more water due to dry mouth the last few months. Recommend OTC Biotene and cut back on free water consumption.  - POCT urinalysis dipstick - Comprehensive metabolic panel - T4 AND TSH  3. Edema, unspecified type Start back on furosemide 20mg  daily - Comprehensive metabolic panel - T4 AND TSH - CBC - mupirocin ointment (BACTROBAN) 2 %; Place 1 application into the nose 2 (two) times daily.  Dispense: 22 g; Refill: 0  4. Dyspnea on exertion Has done well with Breo in the past and was given another sample today.  - CBC - Brain natriuretic peptide  The entirety of the information documented in the History of Present Illness, Review of Systems and Physical Exam were personally obtained by me. Portions of this information were initially documented by April M. Sabra Heck, CMA and reviewed by me for thoroughness and accuracy.        Lelon Huh, MD  [facility]

## 2016-01-26 ENCOUNTER — Other Ambulatory Visit: Payer: Self-pay | Admitting: Family Medicine

## 2016-01-26 ENCOUNTER — Telehealth: Payer: Self-pay

## 2016-01-26 DIAGNOSIS — R7989 Other specified abnormal findings of blood chemistry: Secondary | ICD-10-CM

## 2016-01-26 DIAGNOSIS — I517 Cardiomegaly: Secondary | ICD-10-CM

## 2016-01-26 DIAGNOSIS — I080 Rheumatic disorders of both mitral and aortic valves: Secondary | ICD-10-CM

## 2016-01-26 LAB — CBC
Hematocrit: 28.6 % — ABNORMAL LOW (ref 34.0–46.6)
Hemoglobin: 8.9 g/dL — ABNORMAL LOW (ref 11.1–15.9)
MCH: 27.5 pg (ref 26.6–33.0)
MCHC: 31.1 g/dL — ABNORMAL LOW (ref 31.5–35.7)
MCV: 88 fL (ref 79–97)
PLATELETS: 264 10*3/uL (ref 150–379)
RBC: 3.24 x10E6/uL — AB (ref 3.77–5.28)
RDW: 16.4 % — ABNORMAL HIGH (ref 12.3–15.4)
WBC: 6 10*3/uL (ref 3.4–10.8)

## 2016-01-26 LAB — COMPREHENSIVE METABOLIC PANEL
ALT: 22 IU/L (ref 0–32)
AST: 26 IU/L (ref 0–40)
Albumin/Globulin Ratio: 0.7 — ABNORMAL LOW (ref 1.2–2.2)
Albumin: 3.1 g/dL — ABNORMAL LOW (ref 3.5–4.8)
Alkaline Phosphatase: 76 IU/L (ref 39–117)
BILIRUBIN TOTAL: 0.4 mg/dL (ref 0.0–1.2)
BUN/Creatinine Ratio: 23 (ref 12–28)
BUN: 19 mg/dL (ref 8–27)
CHLORIDE: 99 mmol/L (ref 96–106)
CO2: 27 mmol/L (ref 18–29)
CREATININE: 0.82 mg/dL (ref 0.57–1.00)
Calcium: 9.8 mg/dL (ref 8.7–10.3)
GFR calc non Af Amer: 72 mL/min/{1.73_m2} (ref >59)
GFR, EST AFRICAN AMERICAN: 83 mL/min/{1.73_m2} (ref >59)
GLUCOSE: 109 mg/dL — AB (ref 65–99)
Globulin, Total: 4.5 g/dL (ref 1.5–4.5)
Potassium: 4.1 mmol/L (ref 3.5–5.2)
Sodium: 138 mmol/L (ref 134–144)
TOTAL PROTEIN: 7.6 g/dL (ref 6.0–8.5)

## 2016-01-26 LAB — T4 AND TSH
T4, Total: 6.8 ug/dL (ref 4.5–12.0)
TSH: 1.36 u[IU]/mL (ref 0.450–4.500)

## 2016-01-26 LAB — BRAIN NATRIURETIC PEPTIDE: BNP: 1171.3 pg/mL — AB (ref 0.0–100.0)

## 2016-01-26 NOTE — Telephone Encounter (Signed)
Advised patient's husband as below.

## 2016-01-26 NOTE — Telephone Encounter (Signed)
-----   Message from Birdie Sons, MD sent at 01/26/2016  8:10 AM EST ----- Patient is anemic please add B12 and ferritin to labs drawn yesterday.  Also has low albumin. Please add serum protein electrophoresis

## 2016-01-26 NOTE — Progress Notes (Unsigned)
Please refer cardiology for elevated BNP, edema, dyspnea on exertion and aortic stenosis.

## 2016-01-26 NOTE — Telephone Encounter (Signed)
Yes, its supposed to be on the sores on her legs. The computer must of have automatically filled in to apply to nose and I didn't catch it. Thanks.

## 2016-01-26 NOTE — Telephone Encounter (Signed)
Advised patient of results. Labs were added.   Patient's husband wanted to clarify the instructions on the mupirocin ointment. He reports that the instructions say to apply it in the nose. He states that you told him to put it on her leg. Patient wanted to know if this was an error. Please advise. Thanks!

## 2016-01-27 ENCOUNTER — Telehealth: Payer: Self-pay

## 2016-01-27 NOTE — Telephone Encounter (Signed)
A prescription for [facility]urosemide was sent to [facility] on Tuesday.

## 2016-01-27 NOTE — Telephone Encounter (Signed)
Please advise 

## 2016-01-27 NOTE — Telephone Encounter (Signed)
No answer. Unable to leave message. Will try later.

## 2016-01-27 NOTE — Telephone Encounter (Signed)
-----   Message from Birdie Sons, MD sent at 01/26/2016  1:52 PM EST ----- Echocardiogram shows elevated BNP which is usually indication of strain on heart. This may be causing trouble with her breathing and is probably making swelling worse. She needs referral to cardiology for further evaluation. Have entered order into EP[facility].  Also, should start taking 81mg  enteric coated aspirin once a day if not already doing so.  Still awaiting a few other test results.

## 2016-01-27 NOTE — Telephone Encounter (Signed)
Patient was advised as directed below. Verbalized understanding.  Patient is asking if Dr. Caryn Section going to prescribe "something" to help with the increased urine flow. Please advise.   Thanks,  -Joseline

## 2016-01-28 NOTE — Telephone Encounter (Signed)
Patient's relative [name] was notified.

## 2016-01-31 ENCOUNTER — Telehealth: Payer: Self-pay

## 2016-01-31 ENCOUNTER — Other Ambulatory Visit: Payer: Self-pay | Admitting: Family Medicine

## 2016-01-31 DIAGNOSIS — D472 Monoclonal gammopathy: Secondary | ICD-10-CM

## 2016-01-31 NOTE — Progress Notes (Unsigned)
Needs referral to hematology for gammopathy with M-spike Also, cardiology referral was ordered last week. Has this been done yet?

## 2016-01-31 NOTE — Telephone Encounter (Signed)
-----   Message from Birdie Sons, MD sent at 01/31/2016 10:52 AM EST ----- Labs show elevated levels of Gamma globulin which are a type of antibody. This can be an indication of blood disorder and needs further evaluation. Needs referral to hematology for further evaluation. Have entered order into [facility].

## 2016-01-31 NOTE — Telephone Encounter (Signed)
Advised pt of lab results. Pt verbally acknowledges understanding. Emily Drozdowski, CMA   

## 2016-02-01 ENCOUNTER — Encounter: Payer: Self-pay | Admitting: Cardiology

## 2016-02-01 ENCOUNTER — Telehealth: Payer: Self-pay | Admitting: Cardiology

## 2016-02-01 ENCOUNTER — Ambulatory Visit (INDEPENDENT_AMBULATORY_CARE_PROVIDER_SITE_OTHER): Payer: Medicare Other | Admitting: Cardiology

## 2016-02-01 VITALS — BP 142/70 | HR 90 | Ht 61.0 in | Wt 227.0 lb

## 2016-02-01 DIAGNOSIS — I10 Essential (primary) hypertension: Secondary | ICD-10-CM | POA: Diagnosis not present

## 2016-02-01 DIAGNOSIS — R0602 Shortness of breath: Secondary | ICD-10-CM

## 2016-02-01 DIAGNOSIS — I509 Heart failure, unspecified: Secondary | ICD-10-CM

## 2016-02-01 MED ORDER — CARVEDILOL 3.125 MG PO TABS: 3.1250 mg | ORAL_TABLET | Freq: Two times a day (BID) | ORAL | 6 refills | Status: DC

## 2016-02-01 MED ORDER — FUROSEMIDE 20 MG PO TABS: 40.0000 mg | ORAL_TABLET | Freq: Every day | ORAL | 0 refills | Status: DC

## 2016-02-01 MED ORDER — METOPROLOL TARTRATE 25 MG PO TABS: 25.0000 mg | ORAL_TABLET | Freq: Two times a day (BID) | ORAL | 3 refills | Status: DC

## 2016-02-01 NOTE — Progress Notes (Signed)
Cardiology Office Note   Date:  02/01/2016   ID:  [name], DOB 1945/11/10, MRN [id number]  Referring Doctor:  Lelon Huh, MD   Cardiologist:   Wende Bushy, MD   Reason for consultation:  Chief Complaint  Patient presents with  . other    new patient. SOB and swelling in lower legs.       History of Present Illness: [name] is a 71 y.o. female who presents for Evaluation for shortness of breath, abnormal BNP, aortic stenosis  Patient reports that she has been short of breath recently. She however attributes this to her asthma diagnosis. She mentioned multiple times that she has asthma since the age of 45. Her symptoms somewhat get better with the different inhaler she uses. her shortness breath has gotten worse in the last few weeks and she is not able to move much because of this, but also because of significant leg swelling. She denies chest pain. When asked, she denies orthopnea but admits that she reports sleeping in a recliner. No PND. No palpitations or passing out.  No loss of consciousness, fever, cough, colds, abdominal pain.does get better with the Lasix she is on   ROS:  Please see the history of present illness. Aside from mentioned under HPI, all other systems are reviewed and negative.     Past Medical History:  Diagnosis Date  . Asthma   . History of measles     Past Surgical History:  Procedure Laterality Date  . Cyst(solitary) of breast:removed    . TUBAL LIGATION       reports that she has never smoked. She has never used smokeless tobacco. She reports that she does not drink alcohol or use drugs.   family history includes Cancer in her mother and sister; Diabetes in her brother, mother, and sister; Heart disease in her mother; Heart failure in her mother; Hypertension in her sister; Parkinson's disease in her father.   Outpatient Medications Prior to Visit  Medication Sig Dispense Refill  . albuterol (PROAIR HFA) 108 (90 Base)  MCG/ACT inhaler Inhale 2 puffs into the lungs every 6 (six) hours as needed. 18 Inhaler 3  . fluticasone furoate-vilanterol (BREO ELLIPTA) 200-25 MCG/INH AEPB Inhale 1 puff into the lungs daily. FOR ASTHMA J45.20 1 each 5  . levalbuterol (XOPENEX) 1.25 MG/3ML nebulizer solution Take 1.25 mg by nebulization every 8 (eight) hours as needed for wheezing. 72 mL 3  . lisinopril (PRINIVIL,ZESTRIL) 40 MG tablet Take 1 tablet (40 mg total) by mouth daily. 30 tablet 1  . lovastatin (MEVACOR) 20 MG tablet Take 1 tablet (20 mg total) by mouth at bedtime. 90 tablet 4  . meloxicam (MOBIC) 15 MG tablet Take 1 tablet (15 mg total) by mouth daily as needed for pain. Take with food 90 tablet 4  . metFORMIN (GLUCOPHAGE-XR) 750 MG 24 hr tablet Take 2 tablets (1,500 mg total) by mouth daily. 180 tablet 4  . montelukast (SINGULAIR) 10 MG tablet Take 1 tablet (10 mg total) by mouth daily. For chronic cough 90 tablet 4  . mupirocin ointment (BACTROBAN) 2 % Place 1 application into the nose 2 (two) times daily. (Patient taking differently: Apply 1 application topically 2 (two) times daily. ) 22 g 0  . Polyethyl Glycol-Propyl Glycol (SYSTANE) 0.4-0.3 % GEL ophthalmic gel Place 1 application into both eyes.    Bethann Humble Sulfate (EYE DROPS A/C OP) Apply to eye.    . traZODone (DESYREL) 100 MG tablet  TAKE 1 TABLET BY MOUTH EVERY NIGHT AT BEDTIME AS NEEDED FOR INSOMNIA 90 tablet 4  . diltiazem (DILT-XR) 120 MG 24 hr capsule Take 1 capsule (120 mg total) by mouth daily. REPLACES NIFEDIPINE 90 capsule 1  . furosemide (LASIX) 20 MG tablet Take 1 tablet (20 mg total) by mouth daily as needed for fluid. 30 tablet 3   No facility-administered medications prior to visit.      Allergies: Hydrochlorothiazide and Penicillins    PHYSICAL EXAM: VS:  BP (!) 142/70 (BP Location: Right Arm, Patient Position: Sitting, Cuff Size: Large)   Pulse 90   Ht 5\' 1"  (1.549 m)   Wt 227 lb (103 kg)   BMI 42.89 kg/m  , Body mass  index is 42.89 kg/m. Wt Readings from Last 3 Encounters:  02/01/16 227 lb (103 kg)  01/25/16 227 lb (103 kg)  09/08/15 208 lb (94.3 kg)    GENERAL:  well developed, well nourished, Morbidlyobese, not in acute distress HEENT: normocephalic, pink conjunctivae, anicteric sclerae, no xanthelasma, normal dentition, oropharynx clear NECK:  Positiveneck vein engorgement, JVP increased,  Positive hepatojugular reflux, carotid upstroke brisk and symmetric, no bruit, no thyromegaly, no lymphadenopathy LUNGS:  good respiratory effort, clear to auscultation bilaterally CV:  PMI not displaced, no thrills, no lifts, S1 and S2 within normal limits, no palpable S3 or S4,  3/6 systolic ejection murmur, no rubs, no gallops ABD:  Soft, nontender, nondistended, normoactive bowel sounds, no abdominal aortic bruit, no hepatomegaly, no splenomegaly MS: nontender back, no kyphosis, no scoliosis, no joint deformities EXT:  2+ DP/PT pulses,  +2 to +3 edema, no varicosities, no cyanosis, no clubbing SKIN: warm, nondiaphoretic, normal turgor, no ulcers NEUROPSYCH: alert, oriented to person, place, and time, sensory/motor grossly intact, normal mood, appropriate affect  Recent Labs: 01/25/2016: ALT 22; BNP 1,171.3; BUN 19; Creatinine, Ser 0.82; Platelets 264; Potassium 4.1; Sodium 138; TSH 1.360   Lipid Panel    Component Value Date/Time   CHOL 139 06/25/2015 0959   TRIG 64 06/25/2015 0959   HDL 59 06/25/2015 0959   CHOLHDL 2.4 06/25/2015 0959   LDLCALC 67 06/25/2015 0959     Other studies Reviewed:  EKG:  The ekg from 02/01/2016 was personally reviewed by me and it revealed sinus rhythm, 90 BPM PACs nonspecific ST-T wave changes. QT 360 ms, QTC 413 ms.  Additional studies/ records that were reviewed personally reviewed by me today include:  Echo 08/19/2015: Left ventricle: There was moderate concentric hypertrophy.   Systolic function was normal. The estimated ejection fraction was   in the range of 50%  to 55%. - Aortic valve: There was moderate stenosis. Valve area (VTI): 1.39   cm^2. Valve area (Vmax): 1.11 cm^2. Valve area (Vmean): 1.02   cm^2. - Mitral valve: There was moderate regurgitation. - Left atrium: The atrium was mildly dilated. - Right atrium: The atrium was mildly dilated. - Pericardium, extracardiac: A trivial pericardial effusion was   identified posterior to the heart.   ASSESSMENT AND PLAN: Shortness of breath Legs swelling Physical exam reveals neck vein engorgement and +2 to +3 edema Blood work was abnormal for BNP elevation, would like to have this repeated Symptoms and signs are concerning for congestive heart failure, acute We'll need to repeat the echocardiogram from August and reevaluate ejection fraction Recommend to switch diltiazem to beta blocker. Coreg 3.125 twice a day. If she has issues with this because of her concern for asthma, we can try metoprolol 25 g by mouth twice  a day. Increase Lasix to 40 mg daily. Check BMP and BNP next week Recommend daily weights and low sodium diet.  If weight gain of > 2 lbs over 24 hours, or > 5 lbs over 1 week, please call office.  Follow-up in 2 weeks  Aortic stenosis Recommend repeat echocardiogram for reevaluation  Hypertension BP is well controlled. Continue monitoring BP. Continue current medical therapy and lifestyle changes.    Current medicines are reviewed at length with the patient today.  The patient does not have concerns regarding medicines.  Labs/ tests ordered today include:  Orders Placed This Encounter  Procedures  . B Nat Peptide  . Basic metabolic panel  . B Nat Peptide  . EKG 12-Lead    I had a lengthy and detailed discussion with the patient regarding diagnoses, prognosis, diagnostic options, treatment options , and side effects of medications.   I counseled the patient on importance of lifestyle modification including heart healthy diet, regular physical activity Once current  workup completed.   Disposition:   FU with undersigned after tests  Thank you for this consultation. We will forwarding this consultation to referring physician.   I spent at least 60 minutes with the patient today and more than 50% of the time was spent counseling the patient and coordinating care.    Signed, Wende Bushy, MD  02/01/2016 3:00 PM    [place]  This note was generated in part with voice recognition software and I apologize for any typographical errors that were not detected and corrected.

## 2016-02-01 NOTE — Telephone Encounter (Signed)
She has no active wheezing but I know pt will be concerned. We can try Metoprolol tartrate 25mg  bid (this is cardioselective BB).

## 2016-02-01 NOTE — Telephone Encounter (Signed)
Pharmacist is calling regarding Carvedilol. States pt has asthma and this medication shows "severe level 1 due to asthma". Please call.

## 2016-02-01 NOTE — Telephone Encounter (Signed)
Spoke with patients caregiver that was with her at today's appointment and made him aware of medication change that was made due to her asthma. Let him know that I sent in that prescription to the pharmacy and it should be ready. He verbalized understanding and had no further questions at this time.

## 2016-02-01 NOTE — Patient Instructions (Signed)
Medication Instructions:  Your physician has recommended you make the following change in your medication:  1. STOP Diltiazem  2. START Carvedilol 3.125 mg twice a day 3. INCREASE Furosemide 20 mg to 2 tablets once daily    Labwork: Your physician recommends that you return for lab work in: 1 week for labs.    Testing/Procedures: Your physician has requested that you have an echocardiogram. Echocardiography is a painless test that uses sound waves to create images of your heart. It provides your doctor with information about the size and shape of your heart and how well your heart's chambers and valves are working. This procedure takes approximately one hour. There are no restrictions for this procedure.    Follow-Up: Your physician recommends that you schedule a follow-up appointment in: 2 weeks with Dr. Yvone Neu.   It was a pleasure seeing you today here in the office. Please do not hesitate to give [place] a call back if you have any further questions. [place], BSN

## 2016-02-01 NOTE — Telephone Encounter (Signed)
Let pharmacy know that Dr. Yvone Neu made some changes and will notify patient as well.

## 2016-02-02 LAB — BRAIN NATRIURETIC PEPTIDE: BNP: 1480.7 pg/mL — AB (ref 0.0–100.0)

## 2016-02-09 ENCOUNTER — Other Ambulatory Visit
Admission: RE | Admit: 2016-02-09 | Discharge: 2016-02-09 | Disposition: A | Discharge status: Home / Self Care | Payer: Medicare Other | Source: Ambulatory Visit | Attending: Cardiology | Admitting: Cardiology

## 2016-02-09 ENCOUNTER — Telehealth: Payer: Self-pay | Admitting: *Deleted

## 2016-02-09 ENCOUNTER — Other Ambulatory Visit: Payer: Medicare Other

## 2016-02-09 ENCOUNTER — Other Ambulatory Visit: Payer: Self-pay | Admitting: *Deleted

## 2016-02-09 DIAGNOSIS — I10 Essential (primary) hypertension: Secondary | ICD-10-CM

## 2016-02-09 DIAGNOSIS — I517 Cardiomegaly: Secondary | ICD-10-CM | POA: Diagnosis not present

## 2016-02-09 DIAGNOSIS — I080 Rheumatic disorders of both mitral and aortic valves: Secondary | ICD-10-CM | POA: Insufficient documentation

## 2016-02-09 LAB — BASIC METABOLIC PANEL
Anion gap: 5 (ref 5–15)
BUN: 22 mg/dL — AB (ref 6–20)
CALCIUM: 9 mg/dL (ref 8.9–10.3)
CHLORIDE: 103 mmol/L (ref 101–111)
CO2: 31 mmol/L (ref 22–32)
CREATININE: 0.95 mg/dL (ref 0.44–1.00)
GFR calc non Af Amer: 59 mL/min — ABNORMAL LOW (ref >60)
GLUCOSE: 113 mg/dL — AB (ref 65–99)
Potassium: 3.3 mmol/L — ABNORMAL LOW (ref 3.5–5.1)
Sodium: 139 mmol/L (ref 135–145)

## 2016-02-09 LAB — BRAIN NATRIURETIC PEPTIDE: B Natriuretic Peptide: 1592 pg/mL — ABNORMAL HIGH (ref 0.0–100.0)

## 2016-02-09 MED ORDER — POTASSIUM CHLORIDE ER 10 MEQ PO TBCR: 20.0000 meq | EXTENDED_RELEASE_TABLET | ORAL | 0 refills | Status: DC

## 2016-02-09 MED ORDER — FUROSEMIDE 20 MG PO TABS: 40.0000 mg | ORAL_TABLET | Freq: Every day | ORAL | 0 refills | Status: DC

## 2016-02-09 NOTE — Telephone Encounter (Signed)
-----   Message from Wende Bushy, MD sent at 02/09/2016 10:57 AM EST ----- Recommend to start KCl 24meq 2 tabs --- pls take bid for first 3 days then qd afterwards. Rpt bmp in 1 week.thanks

## 2016-02-09 NOTE — Telephone Encounter (Signed)
Spoke with patient and reviewed results and recommendations with her. She then ask that I speak with her husband and review all information with him. Reviewed results and recommendations with him and he verbalized understanding. Sent in prescription for potassium and then let him know that patient would need to have repeat labs in one week [facility]. He verbalized understanding of our conversation, instructions for medication and repeat labs that need to be done with no further questions at this time.

## 2016-02-11 ENCOUNTER — Telehealth: Payer: Self-pay | Admitting: Family Medicine

## 2016-02-11 NOTE — Telephone Encounter (Signed)
Request from husband please print and mail a list of all medications and what they are for to pt's home.  knb

## 2016-02-11 NOTE — Telephone Encounter (Signed)
Please advise 

## 2016-02-11 NOTE — Telephone Encounter (Signed)
List printed

## 2016-02-13 NOTE — Progress Notes (Signed)
[facility] day:  02/14/2016  Chief Complaint: [name] is a 71 y.o. female with a gammopathy who is referred in consultation by Dr. Lelon Huh for assessment and management.  HPI:   The patient notes regular follow-up every 2-3 months.  She states that she developed fluid in her legs 3 months ago.  Echo on 05/19/2015 revealed an EF of 50-55%.  Her next echo is on 02/25/2016.  SPEP on 01/25/2016 revealed a 0.9 gm/dL monoclonal spike.  CBC revealed a hematocrit of 28.6, hemoglobin 8.9, MCV 88, platelets 264,000, and WBC 6,000.  Comprehensive metabolic panel revealed a creatinine of 0.82, calcium 9.8, albumen 3.1 with a protein of 7.6.  TSH was 1.36 and T4 6.8 (normal).  B12 was 312.  Ferritin was 90.  BNP was 1171.3.  Urinalysis revealed 30+ protein.  She denies any fevers or sweats.  She notes shortness of breath due to asthma and her heart.  She uses Lasix prn as prescribed by her cardiologist.  She has "fits with her left knee".  She denies any bone pain.  She denies any issues with infections.  She notes a sore on her leg which is "slow healing".   She is up-to-date on her mammogram (last 2017) and colonoscopy (2 years ago).   Past Medical History:  Diagnosis Date  . Asthma   . Diabetes mellitus without complication ([place])   . History of measles   . Hypertension     Past Surgical History:  Procedure Laterality Date  . Cyst(solitary) of breast:removed    . TUBAL LIGATION      Family History  Problem Relation Age of Onset  . Cancer Mother     Uterine cancer  . Diabetes Mother   . Heart disease Mother   . Heart failure Mother   . Parkinson's disease Father   . Cancer Sister     breast cancer  . Diabetes Brother     Non-insulin Dependent Diabetes Mellitus  . Mesothelioma Brother   . Hypertension Sister   . Diabetes Sister     Non-insulin dependent Diabetes Mellitus    Social History:  reports that she has never smoked.  She has never used smokeless tobacco. She reports that she does not drink alcohol or use drugs.  She is originally from [place] ([place]  Her husband was born in [place], [place].  They met in middle school in [place].  She has been married to her husband x 52 years.  The patient is accompanied by her husband, [name], today.  Allergies:  Allergies  Allergen Reactions  . Hydrochlorothiazide     Leg Cramps  . Penicillins Rash    Current Medications: Current Outpatient Prescriptions  Medication Sig Dispense Refill  . ADVAIR HFA 115-21 MCG/ACT inhaler     . albuterol (PROAIR HFA) 108 (90 Base) MCG/ACT inhaler Inhale 2 puffs into the lungs every 6 (six) hours as needed. 18 Inhaler 3  . fluticasone furoate-vilanterol (BREO ELLIPTA) 200-25 MCG/INH AEPB Inhale 1 puff into the lungs daily. FOR ASTHMA J45.20 1 each 5  . furosemide (LASIX) 20 MG tablet Take 2 tablets (40 mg total) by mouth daily. 60 tablet 0  . levalbuterol (XOPENEX) 1.25 MG/3ML nebulizer solution Take 1.25 mg by nebulization every 8 (eight) hours as needed for wheezing. 72 mL 3  . lisinopril (PRINIVIL,ZESTRIL) 40 MG tablet Take 1 tablet (40 mg total) by mouth daily. 30 tablet 1  . lovastatin (  MEVACOR) 20 MG tablet Take 1 tablet (20 mg total) by mouth at bedtime. 90 tablet 4  . meloxicam (MOBIC) 15 MG tablet Take 1 tablet (15 mg total) by mouth daily as needed for pain. Take with food 90 tablet 4  . metFORMIN (GLUCOPHAGE-XR) 750 MG 24 hr tablet Take 2 tablets (1,500 mg total) by mouth daily. 180 tablet 4  . metoprolol tartrate (LOPRESSOR) 25 MG tablet Take 1 tablet (25 mg total) by mouth 2 (two) times daily. 60 tablet 3  . metoprolol tartrate (LOPRESSOR) 25 MG tablet Take 25 mg by mouth 2 (two) times daily.    . montelukast (SINGULAIR) 10 MG tablet Take 1 tablet (10 mg total) by mouth daily. For chronic cough 90 tablet 4  . mupirocin ointment (BACTROBAN) 2 % Place 1 application into the nose 2 (two) times daily. (Patient  taking differently: Apply 1 application topically 2 (two) times daily. ) 22 g 0  . potassium chloride (K-DUR) 10 MEQ tablet Take 2 tablets (20 mEq total) by mouth as directed. 2 tabs twice a day for 3 days. Then take 2 tabs Once Daily 66 tablet 0  . traZODone (DESYREL) 100 MG tablet TAKE 1 TABLET BY MOUTH EVERY NIGHT AT BEDTIME AS NEEDED FOR INSOMNIA 90 tablet 4   No current facility-administered medications for this visit.     Review of Systems:  GENERAL:  Feels good.  No fevers, sweats or weight loss. PERFORMANCE STATUS (ECOG):  1 HEENT:  Vision changes due to hypertension and diabetes.  No runny nose, sore throat, mouth sores or tenderness. Lungs: Shortness of breath.  No cough.  No hemoptysis. Cardiac:  No chest pain, palpitations, orthopnea, or PND. GI:  Eating well.  No nausea, vomiting, diarrhea, constipation, melena or hematochezia.  Colonoscopy up-to-date. GU:  No urgency, frequency, dysuria, or hematuria. Musculoskeletal:  No back pain.  Left knee issues.  No muscle tenderness. Extremities:  No pain or swelling. Skin:  Leg sore slow to heal.  No other rashes or skin changes. Neuro:  No headache, numbness or weakness, balance or coordination issues. Endocrine:  Diabetes.  No tyroid issues, hot flashes or night sweats. Psych:  Anxiety.  No mood changes or depression. Pain:  No focal pain. Review of systems:  All other systems reviewed and found to be negative.  Physical Exam: Blood pressure (!) 191/100, pulse 81, temperature 97.1 F (36.2 C), temperature source Tympanic, resp. rate 18, height 5' 1.42" (1.56 m), weight 219 lb 5.7 oz (99.5 kg). GENERAL:  Well developed, well nourished, overweight woman sitting comfortably in a wheelchair in the exam room in no acute distress. MENTAL STATUS:  Alert and oriented to person, place and time. HEAD:  Graying hair.  Normocephalic, atraumatic, face symmetric, no Cushingoid features. EYES:  Glasses.  Pupils equal round and reactive to light  and accomodation.  No conjunctivitis or scleral icterus. ENT:  Oropharynx clear without lesion.  Tongue normal. Mucous membranes moist.  RESPIRATORY:  Clear to auscultation without rales, wheezes or rhonchi. CARDIOVASCULAR:  Regular rate and rhythm without murmur, rub or gallop. ABDOMEN:  Soft, non-tender, with active bowel sounds, and no hepatosplenomegaly.  No masses. SKIN:  No rashes, ulcers or lesions. EXTREMITIES:  Chronic lower extremity edema.  Right lower extremity dressing.  No skin discoloration or tenderness.  No palpable cords. LYMPH NODES: No palpable cervical, supraclavicular, axillary or inguinal adenopathy  NEUROLOGICAL: Unremarkable. PSYCH:  Appropriate.   No visits with results within 3 Day(s) from this visit.  Latest known  visit with results is:  Hospital Outpatient Visit on 02/09/2016  Component Date Value Ref Range Status  . B Natriuretic Peptide 02/09/2016 1592.0* 0.0 - 100.0 pg/mL Final  . Sodium 02/09/2016 139  135 - 145 mmol/L Final  . Potassium 02/09/2016 3.3* 3.5 - 5.1 mmol/L Final  . Chloride 02/09/2016 103  101 - 111 mmol/L Final  . CO2 02/09/2016 31  22 - 32 mmol/L Final  . Glucose, Bld 02/09/2016 113* 65 - 99 mg/dL Final  . BUN 02/09/2016 22* 6 - 20 mg/dL Final  . Creatinine, Ser 02/09/2016 0.95  0.44 - 1.00 mg/dL Final  . Calcium 02/09/2016 9.0  8.9 - 10.3 mg/dL Final  . GFR calc non Af Amer 02/09/2016 59* >60 mL/min Final  . GFR calc Af Amer 02/09/2016 >60  >60 mL/min Final   Comment: (NOTE) The eGFR has been calculated using the CKD EPI equation. This calculation has not been validated in all clinical situations. eGFR's persistently <60 mL/min signify possible Chronic Kidney Disease.   . Anion gap 02/09/2016 5  5 - 15 Final    Assessment:  CORDIE BEAZLEY is a 71 y.o. female with a monoclonal gammopathy.  SPEP on 01/25/2016 revealed a 0.9 gm/dL monoclonal spike.  She denies any bone pain or recurrent infections.  Calcium and creatinine are normal.   Urinalysis revealed 30+ protein.  She has a normocytic anemia.  Labs on 01/25/2016 revealed a normal ferritin (90) and thyroid function tests.  B12 was 312 (low normal).  Last colonoscopy was 2 years ago.  She has chronic lower extremity edema and a slow to heal leg sore.  Patient has hypertension (191/100)  Plan: 1.  Discuss differential diagnosis of monoclonal gammopathy.  Discuss monoclonal gammopathy of unknown significance (MGUS) and multiple myeloma.  Discuss initial work-up. 2.  Discuss anemia and evaluation. 3.  Labs today:  CBC with diff, CMP, SPEP, free light chains, beta2-microglobulin.  4.  Anemia work-up: retic, MMA, folate, ferritin, iron studies. 5.  24 hour urine for UPEP with immunofixation. 6.  Patient to ER to manage blood pressure. 7.  RTC in 1 week for MD assessment and review of testing.   Jillian Asal, MD  02/14/2016, 12:23 PM

## 2016-02-14 ENCOUNTER — Inpatient Hospital Stay: Payer: Medicare Other | Attending: Hematology and Oncology | Admitting: Hematology and Oncology

## 2016-02-14 ENCOUNTER — Encounter: Payer: Self-pay | Admitting: Emergency Medicine

## 2016-02-14 ENCOUNTER — Inpatient Hospital Stay: Payer: Medicare Other

## 2016-02-14 ENCOUNTER — Emergency Department
Admission: EM | Admit: 2016-02-14 | Discharge: 2016-02-14 | Disposition: A | Discharge status: Home / Self Care | Payer: Medicare Other | Attending: Emergency Medicine | Admitting: Emergency Medicine

## 2016-02-14 ENCOUNTER — Encounter: Payer: Self-pay | Admitting: Hematology and Oncology

## 2016-02-14 ENCOUNTER — Other Ambulatory Visit: Payer: Self-pay

## 2016-02-14 VITALS — BP 191/100 | HR 81 | Temp 97.1°F | Resp 18 | Ht 61.42 in | Wt 219.4 lb

## 2016-02-14 DIAGNOSIS — I10 Essential (primary) hypertension: Secondary | ICD-10-CM

## 2016-02-14 DIAGNOSIS — R609 Edema, unspecified: Secondary | ICD-10-CM

## 2016-02-14 DIAGNOSIS — E119 Type 2 diabetes mellitus without complications: Secondary | ICD-10-CM | POA: Diagnosis not present

## 2016-02-14 DIAGNOSIS — D472 Monoclonal gammopathy: Secondary | ICD-10-CM | POA: Diagnosis not present

## 2016-02-14 DIAGNOSIS — Z808 Family history of malignant neoplasm of other organs or systems: Secondary | ICD-10-CM

## 2016-02-14 DIAGNOSIS — D649 Anemia, unspecified: Secondary | ICD-10-CM | POA: Diagnosis not present

## 2016-02-14 DIAGNOSIS — Z7984 Long term (current) use of oral hypoglycemic drugs: Secondary | ICD-10-CM | POA: Diagnosis not present

## 2016-02-14 DIAGNOSIS — Z79899 Other long term (current) drug therapy: Secondary | ICD-10-CM | POA: Diagnosis not present

## 2016-02-14 DIAGNOSIS — Z8619 Personal history of other infectious and parasitic diseases: Secondary | ICD-10-CM

## 2016-02-14 DIAGNOSIS — M25562 Pain in left knee: Secondary | ICD-10-CM

## 2016-02-14 DIAGNOSIS — J45909 Unspecified asthma, uncomplicated: Secondary | ICD-10-CM

## 2016-02-14 DIAGNOSIS — L989 Disorder of the skin and subcutaneous tissue, unspecified: Secondary | ICD-10-CM | POA: Diagnosis not present

## 2016-02-14 DIAGNOSIS — Z803 Family history of malignant neoplasm of breast: Secondary | ICD-10-CM | POA: Diagnosis not present

## 2016-02-14 DIAGNOSIS — Z791 Long term (current) use of non-steroidal anti-inflammatories (NSAID): Secondary | ICD-10-CM | POA: Insufficient documentation

## 2016-02-14 DIAGNOSIS — R0602 Shortness of breath: Secondary | ICD-10-CM

## 2016-02-14 DIAGNOSIS — E113413 Type 2 diabetes mellitus with severe nonproliferative diabetic retinopathy with macular edema, bilateral: Secondary | ICD-10-CM | POA: Diagnosis not present

## 2016-02-14 LAB — RETICULOCYTES
RBC.: 3.47 MIL/uL — ABNORMAL LOW (ref 3.80–5.20)
Retic Count, Absolute: 52.1 10*3/uL (ref 19.0–183.0)
Retic Ct Pct: 1.5 % (ref 0.4–3.1)

## 2016-02-14 LAB — CBC WITH DIFFERENTIAL/PLATELET
Basophils Absolute: 0.1 10*3/uL (ref 0–0.1)
Basophils Relative: 1 %
Eosinophils Absolute: 0.1 10*3/uL (ref 0–0.7)
Eosinophils Relative: 2 %
HCT: 29.1 % — ABNORMAL LOW (ref 35.0–47.0)
Hemoglobin: 9.4 g/dL — ABNORMAL LOW (ref 12.0–16.0)
Lymphocytes Relative: 25 %
Lymphs Abs: 1.5 10*3/uL (ref 1.0–3.6)
MCH: 27.4 pg (ref 26.0–34.0)
MCHC: 32.4 g/dL (ref 32.0–36.0)
MCV: 84.6 fL (ref 80.0–100.0)
Monocytes Absolute: 0.4 10*3/uL (ref 0.2–0.9)
Monocytes Relative: 8 %
Neutro Abs: 3.7 10*3/uL (ref 1.4–6.5)
Neutrophils Relative %: 64 %
Platelets: 225 10*3/uL (ref 150–440)
RBC: 3.43 MIL/uL — ABNORMAL LOW (ref 3.80–5.20)
RDW: 16.9 % — ABNORMAL HIGH (ref 11.5–14.5)
WBC: 5.7 10*3/uL (ref 3.6–11.0)

## 2016-02-14 LAB — COMPREHENSIVE METABOLIC PANEL
ALBUMIN: 3.3 g/dL — AB (ref 3.5–5.0)
ALK PHOS: 69 U/L (ref 38–126)
ALT: 34 U/L (ref 14–54)
ANION GAP: 5 (ref 5–15)
AST: 38 U/L (ref 15–41)
BUN: 24 mg/dL — ABNORMAL HIGH (ref 6–20)
CALCIUM: 9.3 mg/dL (ref 8.9–10.3)
CO2: 30 mmol/L (ref 22–32)
CREATININE: 0.95 mg/dL (ref 0.44–1.00)
Chloride: 102 mmol/L (ref 101–111)
GFR calc Af Amer: >60 mL/min (ref >60)
GFR calc non Af Amer: 59 mL/min — ABNORMAL LOW (ref >60)
GLUCOSE: 122 mg/dL — AB (ref 65–99)
Potassium: 3.9 mmol/L (ref 3.5–5.1)
SODIUM: 137 mmol/L (ref 135–145)
Total Bilirubin: 0.6 mg/dL (ref 0.3–1.2)
Total Protein: 9.2 g/dL — ABNORMAL HIGH (ref 6.5–8.1)

## 2016-02-14 LAB — IRON AND TIBC
Iron: 39 ug/dL (ref 28–170)
Saturation Ratios: 10 % — ABNORMAL LOW (ref 10.4–31.8)
TIBC: 381 ug/dL (ref 250–450)
UIBC: 342 ug/dL

## 2016-02-14 LAB — SEDIMENTATION RATE: Sed Rate: 82 mm/hr — ABNORMAL HIGH (ref 0–30)

## 2016-02-14 LAB — TROPONIN I: Troponin I: 0.03 ng/mL (ref <0.03)

## 2016-02-14 LAB — FERRITIN: Ferritin: 59 ng/mL (ref 11–307)

## 2016-02-14 LAB — FOLATE: Folate: 18 ng/mL (ref >5.9)

## 2016-02-14 MED ORDER — LISINOPRIL 40 MG PO TABS: 40.0000 mg | ORAL_TABLET | Freq: Every day | ORAL | 1 refills | Status: DC

## 2016-02-14 NOTE — ED Notes (Signed)
Pt states she was sent to ER from cancer center for high BP, states she took her metoprolol as prescribed but skipped the lasix, denies any symptoms, pt sitting in wheelchair, family at bedside

## 2016-02-14 NOTE — ED Notes (Signed)
Dr. Kinner notified of critical trop of 0.03 

## 2016-02-14 NOTE — ED Notes (Signed)
Cancer Center RN called this RN and reported pts vitals prior to ED arrival.   11:40 - 191/100 HR 81 NSR 11:55 - 191/100 HR 86 NSR 12:50 - 212/119   Pt reports having taken 25 mg metoprolol at 6:30 this morning. Pt was asymptomatic at cancer center.

## 2016-02-14 NOTE — ED Provider Notes (Signed)
[facility] Emergency Department Provider Note   ____________________________________________    I have reviewed the triage vital signs and the nursing notes.   HISTORY  Chief Complaint Hypertension     HPI [name] is a 71 y.o. female who was sent to the emergency department for hypertension. Patient had an appointment at the cancer center today to evaluate for "high-protein". And when they checked her blood pressure was elevated so they sent her to the emergency department. She has no chest pain, headache, neuro deficits. Overall she feels well and is irritated that she was sent here. She took her metoprolol this morning. She does have history of diabetes hypertension and her cardiologist but her on Lasix as well for lower extremity edema.   Past Medical History:  Diagnosis Date  . Asthma   . Diabetes mellitus without complication ([place])   . History of measles   . Hypertension     Patient Active Problem List   Diagnosis Date Noted  . Monoclonal gammopathy 01/25/2016  . Anemia 01/25/2016  . LVH (left ventricular hypertrophy) 08/24/2015  . Mitral valve insufficiency and aortic valve stenosis 07/14/2015  . Incontinence in female 03/02/2015  . Diabetic retinopathy with macular edema ([place]) 02/19/2015  . Constipation 12/02/2014  . Osteoarthritis 07/31/2014  . Ulcer aphthous oral 06/30/2014  . Insomnia 06/30/2014  . Bleeding internal hemorrhoids 06/30/2014  . Osteopenia 06/30/2014  . Infected sebaceous cyst 06/30/2014  . Fecal occult blood test positive 06/30/2014  . Vitamin D deficiency 04/29/2009  . Diabetes mellitus with nephropathy ([place]) 01/28/2009  . Eczema intertrigo 01/03/2008  . HLD (hyperlipidemia) 04/02/2006  . Edema 08/11/2003  . BMI 40.0-44.9, adult ([place]) 09/22/2002  . Intermittent asthma, uncontrolled 08/31/2000  . Essential (primary) hypertension 08/31/2000  . Leg varices 04/18/1999    Past Surgical History:  Procedure  Laterality Date  . Cyst(solitary) of breast:removed    . TUBAL LIGATION      Prior to Admission medications   Medication Sig Start Date End Date Taking? Authorizing Provider  ADVAIR [facility] 557-32 MCG/ACT inhaler  02/03/16   Historical Provider, MD  albuterol (PROAIR HFA) 108 (90 Base) MCG/ACT inhaler Inhale 2 puffs into the lungs every 6 (six) hours as needed. 03/02/15   Birdie Sons, MD  fluticasone furoate-vilanterol (BREO ELLIPTA) 200-25 MCG/INH AEPB Inhale 1 puff into the lungs daily. FOR ASTHMA J45.20 09/08/15   Birdie Sons, MD  furosemide (LASIX) 20 MG tablet Take 2 tablets (40 mg total) by mouth daily. 02/09/16 05/09/16  Wende Bushy, MD  levalbuterol (XOPENEX) 1.25 MG/3ML nebulizer solution Take 1.25 mg by nebulization every 8 (eight) hours as needed for wheezing. 09/08/15   Birdie Sons, MD  lisinopril (PRINIVIL,ZESTRIL) 40 MG tablet Take 1 tablet (40 mg total) by mouth daily. 09/08/15   Birdie Sons, MD  lovastatin (MEVACOR) 20 MG tablet Take 1 tablet (20 mg total) by mouth at bedtime. 10/22/15   Birdie Sons, MD  meloxicam (MOBIC) 15 MG tablet Take 1 tablet (15 mg total) by mouth daily as needed for pain. Take with food 01/25/16   Birdie Sons, MD  metFORMIN (GLUCOPHAGE-XR) 750 MG 24 hr tablet Take 2 tablets (1,500 mg total) by mouth daily. 01/25/16   Birdie Sons, MD  metoprolol tartrate (LOPRESSOR) 25 MG tablet Take 1 tablet (25 mg total) by mouth 2 (two) times daily. 02/01/16 05/01/16  Wende Bushy, MD  metoprolol tartrate (LOPRESSOR) 25 MG tablet Take 25 mg by mouth  2 (two) times daily.    Historical Provider, MD  montelukast (SINGULAIR) 10 MG tablet Take 1 tablet (10 mg total) by mouth daily. For chronic cough 01/25/16   Birdie Sons, MD  mupirocin ointment (BACTROBAN) 2 % Place 1 application into the nose 2 (two) times daily. Patient taking differently: Apply 1 application topically 2 (two) times daily.  01/25/16   Birdie Sons, MD  potassium chloride (K-DUR) 10 MEQ tablet  Take 2 tablets (20 mEq total) by mouth as directed. 2 tabs twice a day for 3 days. Then take 2 tabs Once Daily 02/09/16 05/09/16  Wende Bushy, MD  traZODone (DESYREL) 100 MG tablet TAKE 1 TABLET BY MOUTH EVERY NIGHT AT BEDTIME AS NEEDED FOR INSOMNIA 01/15/15   Birdie Sons, MD     Allergies Hydrochlorothiazide and Penicillins  Family History  Problem Relation Age of Onset  . Cancer Mother     Uterine cancer  . Diabetes Mother   . Heart disease Mother   . Heart failure Mother   . Parkinson's disease Father   . Cancer Sister     breast cancer  . Diabetes Brother     Non-insulin Dependent Diabetes Mellitus  . Mesothelioma Brother   . Hypertension Sister   . Diabetes Sister     Non-insulin dependent Diabetes Mellitus    Social History Social History  Substance Use Topics  . Smoking status: Never Smoker  . Smokeless tobacco: Never Used  . Alcohol use No     Comment: Occasional alcohol use    Review of Systems  Constitutional: No Dizziness Eyes: No visual changes.   Cardiovascular: Denies chest pain. Respiratory: Denies shortness of breath. Gastrointestinal: No abdominal pain.  No nausea, no vomiting.    Skin: Negative for rash. Neurological: Negative for headaches or weakness  10-point ROS otherwise negative.  ____________________________________________   PHYSICAL EXAM:  VITAL SIGNS: ED Triage Vitals  Enc Vitals Group     BP 02/14/16 1328 (!) 187/102     Pulse Rate 02/14/16 1328 88     Resp 02/14/16 1328 18     Temp 02/14/16 1328 97.7 F (36.5 C)     Temp Source 02/14/16 1328 Oral     SpO2 02/14/16 1328 96 %     Weight 02/14/16 1329 218 lb (98.9 kg)     Height 02/14/16 1329 5\' 1"  (1.549 m)     Head Circumference --      Peak Flow --      Pain Score --      Pain Loc --      Pain Edu? --      Excl. in [place]? --     Constitutional: Alert and oriented. No acute distress. Pleasant and interactive Eyes: Conjunctivae are normal.   Nose: No  congestion/rhinnorhea. Mouth/Throat: Mucous membranes are moist.    Cardiovascular: Normal rate, regular rhythm. Grossly normal heart sounds.  Good peripheral circulation. Respiratory: Normal respiratory effort.  No retractions. Lungs CTAB. Gastrointestinal: Soft and nontender. No distention.  No CVA tenderness. Genitourinary: deferred Musculoskeletal: Positive edema bilaterally.  Warm and well perfused Neurologic:  Normal speech and language. No gross focal neurologic deficits are appreciated.  Skin:  Skin is warm, dry and intact. No rash noted. Psychiatric: Mood and affect are normal. Speech and behavior are normal.  ____________________________________________   LABS (all labs ordered are listed, but only abnormal results are displayed)  Labs Reviewed  COMPREHENSIVE METABOLIC PANEL - Abnormal; Notable for the following:  Result Value   Glucose, Bld 122 (*)    BUN 24 (*)    Total Protein 9.2 (*)    Albumin 3.3 (*)    GFR calc non Af Amer 59 (*)    All other components within normal limits  TROPONIN I - Abnormal; Notable for the following:    Troponin I 0.03 (*)    All other components within normal limits   ____________________________________________  EKG  ED ECG REPORT I, Lavonia Drafts, the attending physician, personally viewed and interpreted this ECG.  Date: 02/14/2016 EKG Time: 1:30 PM Rate: 87 Rhythm: normal sinus rhythm QRS Axis: normal Intervals: normal ST/T Wave abnormalities: normal Conduction Disturbances: none Narrative Interpretation: unremarkable  ____________________________________________  RADIOLOGY  None ____________________________________________   PROCEDURES  Procedure(s) performed: No    Critical Care performed: No ____________________________________________   INITIAL IMPRESSION / ASSESSMENT AND PLAN / ED COURSE  Pertinent labs & imaging results that were available during my care of the patient were reviewed by me and  considered in my medical decision making (see chart for details).  Patient asymptomatic. Well appearing. Discussed with Dr. Rockey Situ who recommends losartan 100 mg. In discussions with patient it appears that they mistakenly stopped the lisinopril 40 mg which they have been tolerating well. They would prefer to continue lisinopril which I think is reasonable.     ____________________________________________   FINAL CLINICAL IMPRESSION(S) / ED DIAGNOSES  Final diagnoses:  Hypertension, unspecified type      NEW MEDICATIONS STARTED DURING THIS VISIT:  New Prescriptions   No medications on file     Note:  This document was prepared using Dragon voice recognition software and may include unintentional dictation errors.    Lavonia Drafts, MD 02/14/16 1447

## 2016-02-14 NOTE — Progress Notes (Signed)
Referred here today by Dr d Caryn Section  Pain in r leg-open sores-husband changing dressings  Bid. Cause unknown per husband. bp  BP initially 191/101  p 81. 15 min later 1154 am bp retaken and 191/100 p-86. Per pt in NAD. Per husband this am pt took Metoprolol 25 mg at 0630 am  Repeat BP @ 1250 was 212/119. Pt insists she is fine/,asymptomatic.  p -86 Per Dr Mike Gip order pt taken to lab then escorted via [facility] w husband to ER. Call to charge RN Marya Amsler w report and repeated to triage RN Larene Beach. Pt acknowledged understanding of need for evaluation but not happy w being in ER. Dr Mike Gip informed pt taken to ER.@ 1325 pm

## 2016-02-14 NOTE — ED Notes (Signed)
Pt assisted to bed, placed on heart monitor, husband at bedside

## 2016-02-14 NOTE — ED Triage Notes (Signed)
States was at cancer center today for blood work and that they sent her here due to high blood pressure. Denies chest pain or sob

## 2016-02-15 LAB — KAPPA/LAMBDA LIGHT CHAINS
Kappa free light chain: 237.2 mg/L — ABNORMAL HIGH (ref 3.3–19.4)
Kappa, lambda light chain ratio: 6.13 — ABNORMAL HIGH (ref 0.26–1.65)
Lambda free light chains: 38.7 mg/L — ABNORMAL HIGH (ref 5.7–26.3)

## 2016-02-15 LAB — BETA 2 MICROGLOBULIN, SERUM: Beta-2 Microglobulin: 3.9 mg/L — ABNORMAL HIGH (ref 0.6–2.4)

## 2016-02-16 LAB — PROTEIN ELECTROPHORESIS, SERUM
A/G Ratio: 0.6 — ABNORMAL LOW (ref 0.7–1.7)
Albumin ELP: 3.3 g/dL (ref 2.9–4.4)
Alpha-1-Globulin: 0.3 g/dL (ref 0.0–0.4)
Alpha-2-Globulin: 0.7 g/dL (ref 0.4–1.0)
Beta Globulin: 1.2 g/dL (ref 0.7–1.3)
Gamma Globulin: 3.5 g/dL — ABNORMAL HIGH (ref 0.4–1.8)
Globulin, Total: 5.7 g/dL — ABNORMAL HIGH (ref 2.2–3.9)
M-Spike, %: 1.9 g/dL — ABNORMAL HIGH
Total Protein ELP: 9 g/dL — ABNORMAL HIGH (ref 6.0–8.5)

## 2016-02-17 LAB — PROTEIN ELECTROPHORESIS
A/G RATIO SPE: 0.6 — AB (ref 0.7–1.7)
Albumin ELP: 2.9 g/dL (ref 2.9–4.4)
Alpha 1: 0.3 g/dL (ref 0.0–0.4)
Alpha 2: 0.7 g/dL (ref 0.4–1.0)
Beta: 1.1 g/dL (ref 0.7–1.3)
GAMMA GLOBULIN: 3 g/dL — AB (ref 0.4–1.8)
GLOBULIN, TOTAL: 5.1 g/dL — AB (ref 2.2–3.9)
M-SPIKE, %: 0.9 g/dL — AB
TOTAL PROTEIN: 8 g/dL (ref 6.0–8.5)

## 2016-02-17 LAB — VITAMIN B12: VITAMIN B 12: 312 pg/mL (ref 232–1245)

## 2016-02-17 LAB — SPECIMEN STATUS REPORT

## 2016-02-17 LAB — FERRITIN: Ferritin: 90 ng/mL (ref 15–150)

## 2016-02-17 LAB — METHYLMALONIC ACID, SERUM: Methylmalonic Acid, Quantitative: 198 nmol/L (ref 0–378)

## 2016-02-18 ENCOUNTER — Other Ambulatory Visit
Admission: RE | Admit: 2016-02-18 | Discharge: 2016-02-18 | Disposition: A | Discharge status: Home / Self Care | Payer: Medicare Other | Source: Ambulatory Visit | Attending: Cardiology | Admitting: Cardiology

## 2016-02-18 ENCOUNTER — Other Ambulatory Visit: Payer: Self-pay | Admitting: *Deleted

## 2016-02-18 DIAGNOSIS — I10 Essential (primary) hypertension: Secondary | ICD-10-CM | POA: Insufficient documentation

## 2016-02-18 DIAGNOSIS — I080 Rheumatic disorders of both mitral and aortic valves: Secondary | ICD-10-CM

## 2016-02-18 DIAGNOSIS — I517 Cardiomegaly: Secondary | ICD-10-CM

## 2016-02-18 LAB — BASIC METABOLIC PANEL
Anion gap: 5 (ref 5–15)
BUN: 24 mg/dL — AB (ref 6–20)
CO2: 30 mmol/L (ref 22–32)
CREATININE: 0.95 mg/dL (ref 0.44–1.00)
Calcium: 9 mg/dL (ref 8.9–10.3)
Chloride: 103 mmol/L (ref 101–111)
GFR calc Af Amer: >60 mL/min (ref >60)
GFR, EST NON AFRICAN AMERICAN: 59 mL/min — AB (ref >60)
GLUCOSE: 113 mg/dL — AB (ref 65–99)
POTASSIUM: 4.1 mmol/L (ref 3.5–5.1)
Sodium: 138 mmol/L (ref 135–145)

## 2016-02-20 DIAGNOSIS — D649 Anemia, unspecified: Secondary | ICD-10-CM | POA: Diagnosis not present

## 2016-02-20 DIAGNOSIS — Z79899 Other long term (current) drug therapy: Secondary | ICD-10-CM | POA: Diagnosis not present

## 2016-02-20 DIAGNOSIS — Z808 Family history of malignant neoplasm of other organs or systems: Secondary | ICD-10-CM | POA: Diagnosis not present

## 2016-02-20 DIAGNOSIS — E119 Type 2 diabetes mellitus without complications: Secondary | ICD-10-CM | POA: Diagnosis not present

## 2016-02-20 DIAGNOSIS — R609 Edema, unspecified: Secondary | ICD-10-CM | POA: Diagnosis not present

## 2016-02-20 DIAGNOSIS — Z803 Family history of malignant neoplasm of breast: Secondary | ICD-10-CM | POA: Diagnosis not present

## 2016-02-20 DIAGNOSIS — R0602 Shortness of breath: Secondary | ICD-10-CM | POA: Diagnosis not present

## 2016-02-20 DIAGNOSIS — M25562 Pain in left knee: Secondary | ICD-10-CM | POA: Diagnosis not present

## 2016-02-20 DIAGNOSIS — Z7984 Long term (current) use of oral hypoglycemic drugs: Secondary | ICD-10-CM | POA: Diagnosis not present

## 2016-02-20 DIAGNOSIS — Z8619 Personal history of other infectious and parasitic diseases: Secondary | ICD-10-CM | POA: Diagnosis not present

## 2016-02-20 DIAGNOSIS — I10 Essential (primary) hypertension: Secondary | ICD-10-CM | POA: Diagnosis not present

## 2016-02-20 DIAGNOSIS — L989 Disorder of the skin and subcutaneous tissue, unspecified: Secondary | ICD-10-CM | POA: Diagnosis not present

## 2016-02-20 DIAGNOSIS — D472 Monoclonal gammopathy: Secondary | ICD-10-CM | POA: Diagnosis not present

## 2016-02-20 DIAGNOSIS — J45909 Unspecified asthma, uncomplicated: Secondary | ICD-10-CM | POA: Diagnosis not present

## 2016-02-21 ENCOUNTER — Telehealth: Payer: Self-pay | Admitting: Family Medicine

## 2016-02-21 ENCOUNTER — Inpatient Hospital Stay (HOSPITAL_BASED_OUTPATIENT_CLINIC_OR_DEPARTMENT_OTHER): Payer: Medicare Other | Admitting: Hematology and Oncology

## 2016-02-21 ENCOUNTER — Ambulatory Visit: Payer: Medicare Other | Admitting: Hematology and Oncology

## 2016-02-21 ENCOUNTER — Other Ambulatory Visit: Payer: Self-pay | Admitting: *Deleted

## 2016-02-21 VITALS — BP 187/94 | HR 73 | Temp 97.3°F | Resp 18 | Wt 216.0 lb

## 2016-02-21 DIAGNOSIS — Z79899 Other long term (current) drug therapy: Secondary | ICD-10-CM

## 2016-02-21 DIAGNOSIS — Z803 Family history of malignant neoplasm of breast: Secondary | ICD-10-CM | POA: Diagnosis not present

## 2016-02-21 DIAGNOSIS — Z7984 Long term (current) use of oral hypoglycemic drugs: Secondary | ICD-10-CM

## 2016-02-21 DIAGNOSIS — D649 Anemia, unspecified: Secondary | ICD-10-CM

## 2016-02-21 DIAGNOSIS — D472 Monoclonal gammopathy: Secondary | ICD-10-CM | POA: Diagnosis not present

## 2016-02-21 DIAGNOSIS — Z808 Family history of malignant neoplasm of other organs or systems: Secondary | ICD-10-CM

## 2016-02-21 DIAGNOSIS — Z8619 Personal history of other infectious and parasitic diseases: Secondary | ICD-10-CM | POA: Diagnosis not present

## 2016-02-21 DIAGNOSIS — I10 Essential (primary) hypertension: Secondary | ICD-10-CM

## 2016-02-21 DIAGNOSIS — S81801D Unspecified open wound, right lower leg, subsequent encounter: Secondary | ICD-10-CM

## 2016-02-21 DIAGNOSIS — R609 Edema, unspecified: Secondary | ICD-10-CM

## 2016-02-21 DIAGNOSIS — R0602 Shortness of breath: Secondary | ICD-10-CM

## 2016-02-21 DIAGNOSIS — J45909 Unspecified asthma, uncomplicated: Secondary | ICD-10-CM | POA: Diagnosis not present

## 2016-02-21 DIAGNOSIS — L989 Disorder of the skin and subcutaneous tissue, unspecified: Secondary | ICD-10-CM | POA: Diagnosis not present

## 2016-02-21 DIAGNOSIS — E119 Type 2 diabetes mellitus without complications: Secondary | ICD-10-CM

## 2016-02-21 DIAGNOSIS — M25562 Pain in left knee: Secondary | ICD-10-CM | POA: Diagnosis not present

## 2016-02-21 NOTE — Telephone Encounter (Signed)
Please contact patient had have her schedule follow up this week for blood pressure which has been very high at Dr. Kem Parkinson office.

## 2016-02-21 NOTE — Progress Notes (Signed)
[name]  Chief Complaint: [name] is a 71 y.o. female with a monoclonal gammopathy who is seen for review of initial work-up and discussion regarding direction of therapy.  HPI: The patient was last seen in the medical oncology clinic on 0212/2018.  At that time, she was seen for initial assessment.  She had a monoclonal gammopathy as well as a normocytic anemia.  She had a poorly healing right lower extremity wound.  She has marked hypertension.  She was sent to the ER after clinic to manage her blood pressure. Her lisinopril was restarted.  Work-up on 02/14/2016 revealed a hematocrit of 29.1, hemoglobin 9.4, MCV 84.6, platelets 225,000, white count 5700 with an [place] of 3700.  Reticulocyte count was 1.5%. Ferritin was 59.  Iron studies included a saturation of 10% and a TIBC of 381. Sedimentation rate was 82 (high).  MMA was 198 (normal).  Folate was 18.  Serum protein electrophoresis revealed a 1.9 g/dL monoclonal spike.  Kappa free light chains were 237.2, lambda free light chains 38.7 with a ratio of 6.13 (0.26-1.65).  Beta2-microglobulin was 3.9 (0.6-2.4).  BMP revealed a creatinine of 0.95 and calcium 9.0.  24 hour urine for UPEP and free light chains is pending.  Bone survey is pending.  She notes changes in her blood pressure medication.  Diltiazem was stopped.  She is on metoprolol 25 mg BID.  She started carvedilol.  Lasix was increased from 20 mg a day to 40 mg a day.  Symptomatically, she has ongoing issues with her leg.  It weeps.  She is using Bactroban.  She denies any fever or pain.  Her husband states that it is getting better.     Past Medical History:  Diagnosis Date  . Asthma   . Diabetes mellitus without complication ([place])   . History of measles   . Hypertension     Past Surgical History:  Procedure Laterality Date  . Cyst(solitary) of breast:removed    . TUBAL LIGATION      Family History   Problem Relation Age of Onset  . Cancer Mother     Uterine cancer  . Diabetes Mother   . Heart disease Mother   . Heart failure Mother   . Parkinson's disease Father   . Cancer Sister     breast cancer  . Diabetes Brother     Non-insulin Dependent Diabetes Mellitus  . Mesothelioma Brother   . Hypertension Sister   . Diabetes Sister     Non-insulin dependent Diabetes Mellitus    Social History:  reports that she has never smoked. She has never used smokeless tobacco. She reports that she does not drink alcohol or use drugs.  She is originally from [place] ([place]  Her husband was born in [place], [place].  They met in middle school in [place].  She has been married to her husband x 52 years.  The patient is accompanied by her husband, [name], today.  Allergies:  Allergies  Allergen Reactions  . Hydrochlorothiazide     Leg Cramps  . Penicillins Rash    Current Medications: Current Outpatient Prescriptions  Medication Sig Dispense Refill  . ADVAIR HFA 115-21 MCG/ACT inhaler     . albuterol (PROAIR HFA) 108 (90 Base) MCG/ACT inhaler Inhale 2 puffs into the lungs every 6 (six) hours as needed. 18 Inhaler 3  . fluticasone furoate-vilanterol (BREO ELLIPTA) 200-25 MCG/INH AEPB Inhale 1  puff into the lungs daily. FOR ASTHMA J45.20 1 each 5  . furosemide (LASIX) 20 MG tablet Take 2 tablets (40 mg total) by mouth daily. 60 tablet 0  . levalbuterol (XOPENEX) 1.25 MG/3ML nebulizer solution Take 1.25 mg by nebulization every 8 (eight) hours as needed for wheezing. 72 mL 3  . lisinopril (PRINIVIL,ZESTRIL) 40 MG tablet Take 1 tablet (40 mg total) by mouth daily. 30 tablet 1  . lovastatin (MEVACOR) 20 MG tablet Take 1 tablet (20 mg total) by mouth at bedtime. 90 tablet 4  . meloxicam (MOBIC) 15 MG tablet Take 1 tablet (15 mg total) by mouth daily as needed for pain. Take with food 90 tablet 4  . metFORMIN (GLUCOPHAGE-XR) 750 MG 24 hr tablet Take 2 tablets (1,500 mg total) by mouth  daily. 180 tablet 4  . metoprolol tartrate (LOPRESSOR) 25 MG tablet Take 1 tablet (25 mg total) by mouth 2 (two) times daily. 60 tablet 3  . mupirocin ointment (BACTROBAN) 2 % Place 1 application into the nose 2 (two) times daily. (Patient taking differently: Apply 1 application topically 2 (two) times daily. ) 22 g 0  . potassium chloride (K-DUR) 10 MEQ tablet Take 2 tablets (20 mEq total) by mouth as directed. 2 tabs twice a day for 3 days. Then take 2 tabs Once Daily 66 tablet 0  . traZODone (DESYREL) 100 MG tablet TAKE 1 TABLET BY MOUTH EVERY NIGHT AT BEDTIME AS NEEDED FOR INSOMNIA 90 tablet 4  . metoprolol tartrate (LOPRESSOR) 25 MG tablet Take 25 mg by mouth 2 (two) times daily.    . montelukast (SINGULAIR) 10 MG tablet Take 1 tablet (10 mg total) by mouth daily. For chronic cough (Patient not taking: Reported on Watson) 90 tablet 4   No current facility-administered medications for this visit.     Review of Systems:  GENERAL:  Feels "ok".  No fevers, sweats or weight loss. PERFORMANCE STATUS (ECOG):  1 HEENT:  Chronic vision changes.  No visual changes, runny nose, sore throat, mouth sores or tenderness. Lungs: Shortness of breath.  No cough.  No hemoptysis. Cardiac:  No chest pain, palpitations, orthopnea, or PND. GI:  No nausea, vomiting, diarrhea, constipation, melena or hematochezia.  Colonoscopy up-to-date. GU:  No urgency, frequency, dysuria, or hematuria. Musculoskeletal:  No back pain.  No joint pain.  No muscle tenderness. Extremities:  Lower extremity swelling.  No pain. Skin:  Right leg weeping.  No other rashes or skin changes. Neuro:  No headache, numbness or weakness, balance or coordination issues. Endocrine:  Diabetes.  No thyroid issues, hot flashes or night sweats. Psych:  Anxiety.  No mood changes, depression. Pain:  No focal pain. Review of systems:  All other systems reviewed and found to be negative.  Physical Exam: Blood pressure (!) 187/94, pulse 73,  temperature 97.3 F (36.3 C), temperature source Tympanic, resp. rate 18, weight 216 lb (98 kg). GENERAL:  Well developed, well nourished, overweight woman sitting comfortably in a wheelchair in the exam room in no acute distress. MENTAL STATUS:  Alert and oriented to person, place and time. HEAD:  Graying blonde/brown hair.  Normocephalic, atraumatic, face symmetric, no Cushingoid features. EYES:  Glasses.  Blue eyes.  No conjunctivitis or scleral icterus. EXTREMITIES:  Chronic lower extremity edema.  Right lower extremity dressing removed.  2 x 1 cm and 3 x 2 cm area.  No tenderness.  No palpable cords. NEUROLOGICAL: Unremarkable. PSYCH:  Appropriate.   No visits with results within 3 Day(s)  from this visit.  Latest known visit with results is:  Hospital Outpatient Visit on 02/18/2016  Component Date Value Ref Range Status  . Sodium 02/18/2016 138  135 - 145 mmol/L Final  . Potassium 02/18/2016 4.1  3.5 - 5.1 mmol/L Final  . Chloride 02/18/2016 103  101 - 111 mmol/L Final  . CO2 02/18/2016 30  22 - 32 mmol/L Final  . Glucose, Bld 02/18/2016 113* 65 - 99 mg/dL Final  . BUN 02/18/2016 24* 6 - 20 mg/dL Final  . Creatinine, Ser 02/18/2016 0.95  0.44 - 1.00 mg/dL Final  . Calcium 02/18/2016 9.0  8.9 - 10.3 mg/dL Final  . GFR calc non Af Amer 02/18/2016 59* >60 mL/min Final  . GFR calc Af Amer 02/18/2016 >60  >60 mL/min Final   Comment: (NOTE) The eGFR has been calculated using the CKD EPI equation. This calculation has not been validated in all clinical situations. eGFR's persistently <60 mL/min signify possible Chronic Kidney Disease.   . Anion gap 02/18/2016 5  5 - 15 Final    Assessment:  SAMREET EDENFIELD is a 71 y.o. female with an IgG monoclonal gammopathy.  SPEP on 01/25/2016 revealed a 0.9 gm/dL monoclonal spike.  She denies any bone pain or recurrent infections.  Calcium and creatinine are normal.  Urinalysis revealed 30+ protein.  Work-up on 02/14/2016 revealed a hematocrit of  29.1, hemoglobin 9.4, MCV 84.6, platelets 225,000, white count 5700 with an [place] of 3700.  SPEP revealed a 1.9 g/dL monoclonal spike.  Kappa free light chains were 237.2, lambda free light chains 38.7 with a ratio of 6.13 (0.26-1.65).  Beta 2-microglobulin was 3.9 (0.6-2.4).  BMP revealed a creatinine of 0.95 and calcium 9.0.  She has a normocytic anemia.  Work-up on 01/25/2016 and 02/14/2016 revealed a normal ferritin (90), iron studies, folate, and thyroid function tests.  B12 was 312 (low normal) on 01/25/2016, but with a normal MMA on 02/14/2016 thus ruling out B12 deficiency.  Reticulocyte count is inappropiately low.  Last colonoscopy was 2 years ago.  She has chronic lower extremity edema and a poorly healing right lower extremity wound.  She has ongoing issues with hypertension.  Plan: 1.  Review work-up.  Discuss concern for rapidly increasing M-spike, unexplained anemia, and elevated beta 2-microglobulin.  Discuss need for bone survey to r/o lytic lesions.  Discuss plan for bone marrow aspirate and biopsy.  Procedure described. 2.  Discuss poorly healing wound.  Discuss with Dr. Caryn Section.  Discuss referral to wound care. 3.  Bone survey (patient did not go at last visit). 4.  Schedule bone marrow aspirate and biopsy. 5.  Referral to wound care clinic. 6.  RTC 10 days after bone marrow for MD assessment and discussion regarding direction of therapy.  Addendum:  24 hour urine revealed 803 mg/24 hours of protein with 31 mg/24 hours of an IgG monoclonal protein with kappa light chain specificity.  Bence Jones protein was positive, kappa type.   Lequita Asal, MD  Watson, 2:17 PM

## 2016-02-21 NOTE — Telephone Encounter (Signed)
Scheduled ov for 02/23/2016 at 11:00 am.

## 2016-02-21 NOTE — Progress Notes (Signed)
BP elevated today.  200/104 HR 77 .  Recheck 187/94 HR 73.  Patient states her right leg is weeping.  Wants MD to assess.

## 2016-02-23 ENCOUNTER — Other Ambulatory Visit: Payer: Self-pay | Admitting: Hematology and Oncology

## 2016-02-23 ENCOUNTER — Encounter: Payer: Self-pay | Admitting: Family Medicine

## 2016-02-23 ENCOUNTER — Ambulatory Visit (INDEPENDENT_AMBULATORY_CARE_PROVIDER_SITE_OTHER): Payer: Medicare Other | Admitting: Family Medicine

## 2016-02-23 VITALS — BP 166/92 | HR 74 | Temp 97.6°F | Resp 18 | Wt 214.0 lb

## 2016-02-23 DIAGNOSIS — I517 Cardiomegaly: Secondary | ICD-10-CM

## 2016-02-23 DIAGNOSIS — D472 Monoclonal gammopathy: Secondary | ICD-10-CM

## 2016-02-23 DIAGNOSIS — R609 Edema, unspecified: Secondary | ICD-10-CM

## 2016-02-23 DIAGNOSIS — E1121 Type 2 diabetes mellitus with diabetic nephropathy: Secondary | ICD-10-CM

## 2016-02-23 DIAGNOSIS — L03115 Cellulitis of right lower limb: Secondary | ICD-10-CM

## 2016-02-23 DIAGNOSIS — I10 Essential (primary) hypertension: Secondary | ICD-10-CM | POA: Diagnosis not present

## 2016-02-23 LAB — IFE+PROTEIN ELECTRO, 24-HR UR
% BETA, Urine: 12.3 %
ALPHA 1 URINE: 5.3 %
Albumin, U: 58.6 %
Alpha 2, Urine: 5.8 %
GAMMA GLOBULIN URINE: 17.9 %
M-SPIKE %, Urine: 3.9 % — ABNORMAL HIGH
M-Spike, Mg/24 Hr: 31 — ABNORMAL HIGH
Total Protein, Urine-Ur/day: 803 — ABNORMAL HIGH (ref 30–150)
Total Protein, Urine: 44.6 mg/dL
Total Volume: 1800

## 2016-02-23 MED ORDER — DOXYCYCLINE HYCLATE 100 MG PO TABS: 100.0000 mg | ORAL_TABLET | Freq: Two times a day (BID) | ORAL | 0 refills | Status: DC

## 2016-02-23 MED ORDER — SPIRONOLACTONE 25 MG PO TABS: 25.0000 mg | ORAL_TABLET | Freq: Every day | ORAL | 2 refills | Status: DC

## 2016-02-23 MED ORDER — HYDROCODONE-ACETAMINOPHEN 7.5-325 MG PO TABS: 1.0000 | ORAL_TABLET | Freq: Four times a day (QID) | ORAL | 0 refills | Status: DC | PRN

## 2016-02-23 NOTE — Patient Instructions (Signed)
Stop potassium and start spironolactone 25mg  once a day

## 2016-02-23 NOTE — Progress Notes (Signed)
Patient: [name] Female    DOB: Jan 27, 1945   71 y.o.   MRN: [id number] Visit Date: 02/23/2016  Today's Provider: Lelon Huh, MD   Chief Complaint  Patient presents with  . Hypertension   Subjective:    HPI  Follow up ER visit and Elevated blood pressure   Patient was seen in ER for elevated blood pressure on 02/14/2016. She was treated for Hypertension. Treatment for this included restarting Lisinopril which she had run out of.  She reports good compliance with treatment. She reports this condition is Unchanged. Patient was seen by Oncology on 02/21/2016 as an outpatient and was found to have an elevated blood pressure reading of 187/94. Patient has also been having swelling and pain in her lower right leg. She states it has been draining clear yellow fluid.   ------------------------------------------------------------------------------------ She was seen by Dr. Yvone Neu for DOE and elevated BNP at the end of January and was changed from Diltiazem to a betablocker. However patient inadvertently stopped taking lisinopril at some point, but restarted when she went to ER.   She states she is scheduled for follow up echocardiogram on 02/25/2016  She has also had sore on right leg for several weeks. Has been using bactroban but it is not improving. Is having worsening pain in all her joints and in right leg.       Allergies  Allergen Reactions  . Hydrochlorothiazide     Leg Cramps  . Penicillins Rash     Current Outpatient Prescriptions:  .  ADVAIR HFA 115-21 MCG/ACT inhaler, , Disp: , Rfl:  .  albuterol (PROAIR HFA) 108 (90 Base) MCG/ACT inhaler, Inhale 2 puffs into the lungs every 6 (six) hours as needed., Disp: 18 Inhaler, Rfl: 3 .  fluticasone furoate-vilanterol (BREO ELLIPTA) 200-25 MCG/INH AEPB, Inhale 1 puff into the lungs daily. FOR ASTHMA J45.20, Disp: 1 each, Rfl: 5 .  furosemide (LASIX) 20 MG tablet, Take 2 tablets (40 mg total) by mouth daily., Disp:  60 tablet, Rfl: 0 .  levalbuterol (XOPENEX) 1.25 MG/3ML nebulizer solution, Take 1.25 mg by nebulization every 8 (eight) hours as needed for wheezing., Disp: 72 mL, Rfl: 3 .  lisinopril (PRINIVIL,ZESTRIL) 40 MG tablet, Take 1 tablet (40 mg total) by mouth daily., Disp: 30 tablet, Rfl: 1 .  lovastatin (MEVACOR) 20 MG tablet, Take 1 tablet (20 mg total) by mouth at bedtime., Disp: 90 tablet, Rfl: 4 .  meloxicam (MOBIC) 15 MG tablet, Take 1 tablet (15 mg total) by mouth daily as needed for pain. Take with food, Disp: 90 tablet, Rfl: 4 .  metFORMIN (GLUCOPHAGE-XR) 750 MG 24 hr tablet, Take 2 tablets (1,500 mg total) by mouth daily., Disp: 180 tablet, Rfl: 4 .  metoprolol tartrate (LOPRESSOR) 25 MG tablet, Take 1 tablet (25 mg total) by mouth 2 (two) times daily., Disp: 60 tablet, Rfl: 3 .  montelukast (SINGULAIR) 10 MG tablet, Take 1 tablet (10 mg total) by mouth daily. For chronic cough, Disp: 90 tablet, Rfl: 4 .  mupirocin ointment (BACTROBAN) 2 %, Place 1 application into the nose 2 (two) times daily. (Patient taking differently: Apply 1 application topically 2 (two) times daily. ), Disp: 22 g, Rfl: 0 .  potassium chloride (K-DUR) 10 MEQ tablet, Take 2 tablets (20 mEq total) by mouth as directed. 2 tabs twice a day for 3 days. Then take 2 tabs Once Daily, Disp: 66 tablet, Rfl: 0 .  traZODone (DESYREL) 100 MG tablet, TAKE 1  TABLET BY MOUTH EVERY NIGHT AT BEDTIME AS NEEDED FOR INSOMNIA, Disp: 90 tablet, Rfl: 4  Review of Systems  Constitutional: Negative for appetite change, chills, fatigue and fever.  Respiratory: Positive for shortness of breath. Negative for chest tightness.   Cardiovascular: Positive for leg swelling (right leg). Negative for chest pain and palpitations.  Gastrointestinal: Negative for abdominal pain, nausea and vomiting.  Endocrine: Positive for polyuria.  Genitourinary: Positive for frequency.  Skin: Positive for color change (lowe right leg) and wound.  Neurological: Negative  for dizziness and weakness.    Social History  Substance Use Topics  . Smoking status: Never Smoker  . Smokeless tobacco: Never Used  . Alcohol use No     Comment: Occasional alcohol use   Objective:   BP (!) 166/92 (BP Location: Right Arm, Patient Position: Sitting, Cuff Size: Large)   Pulse 74   Temp 97.6 F (36.4 C) (Oral)   Resp 18   Wt 214 lb (97.1 kg) Comment: per patient report; wheel chair  SpO2 95% Comment: room air  BMI 40.43 kg/m   Physical Exam   General Appearance:    Alert, cooperative, no distress  Eyes:    PERRL, conjunctiva/corneas clear, EOM's intact       Lungs:     Clear to auscultation bilaterally, respirations unlabored  Heart:    II/VI systolic murmur  Neurologic:   Awake, alert, oriented x 3. No apparent focal neurological           defect.   Ext:    About 2cm erosion right anterior leg with scan yellow discharge and faint underlying erythema.        Assessment & Plan:     1. Cellulitis of right lower extremity  - HYDROcodone-acetaminophen (NORCO) 7.5-325 MG tablet; Take 1 tablet by mouth every 6 (six) hours as needed for moderate pain.  Dispense: 60 tablet; Refill: 0 - doxycycline (VIBRA-TABS) 100 MG tablet; Take 1 tablet (100 mg total) by mouth 2 (two) times daily.  Dispense: 20 tablet; Refill: 0 - Wound culture  2. Essential (primary) hypertension Labile BP with recent medications changes and patient inadvertently stopping lisinopril. She has been back on lisinopril for a few days. Will also add spironolactone 25mg  a day as below and she is to STOP taking potassium supplements. Will recheck BP and renal panel in 2-3 weeks.   3. Diabetes mellitus with nephropathy ([place]) Lab Results  Component Value Date   HGBA1C 6.6 01/25/2016     4. Edema, unspecified type Expect some improvement with aldoactone.  - spironolactone (ALDACTONE) 25 MG tablet; Take 1 tablet (25 mg total) by mouth daily.  Dispense: 30 tablet; Refill: 2 - Renal function  panel  5. Monoclonal gammopathy Follow up hematology.   6. LVH (left ventricular hypertrophy) Echo scheduled later this week per Dr. Yvone Neu.        Lelon Huh, MD  [place]

## 2016-02-25 ENCOUNTER — Ambulatory Visit (INDEPENDENT_AMBULATORY_CARE_PROVIDER_SITE_OTHER): Payer: Medicare Other

## 2016-02-25 ENCOUNTER — Other Ambulatory Visit: Payer: Self-pay

## 2016-02-25 ENCOUNTER — Telehealth: Payer: Self-pay | Admitting: *Deleted

## 2016-02-25 ENCOUNTER — Telehealth: Payer: Self-pay | Admitting: Cardiology

## 2016-02-25 DIAGNOSIS — I080 Rheumatic disorders of both mitral and aortic valves: Secondary | ICD-10-CM | POA: Diagnosis not present

## 2016-02-25 DIAGNOSIS — I517 Cardiomegaly: Secondary | ICD-10-CM

## 2016-02-25 LAB — ECHOCARDIOGRAM COMPLETE
AOVTI: 38.8 cm
AV Area VTI index: 0.73 cm2/m2
AV Area mean vel: 1.29 cm2
AV Mean grad: 8 mmHg
AV VEL mean LVOT/AV: 0.41
AVA: 1.41 cm2
AVAREAMEANVIN: 0.67 cm2/m2
Ao-asc: 29 cm
Area-P 1/2: 5.5 cm2
CHL CUP AV VEL: 1.41
CHL CUP REG VEL DIAS: 183 cm/s
DOP CAL AO MEAN VELOCITY: 130 cm/s
E decel time: 137 msec
E/e' ratio: 21.63
FS: 13 % — AB (ref 28–44)
IVS/LV PW RATIO, ED: 1.06
LAVOL: 74.1 mL
LAVOLA4C: 57.6 mL
LAVOLIN: 38.2 mL/m2
LDCA: 3.14 cm2
LV E/e' medial: 21.63
LV E/e'average: 21.63
LV TDI E'MEDIAL: 5.33
LV e' LATERAL: 4.9 cm/s
LVOT VTI: 17.4 cm
LVOT diameter: 20 mm
LVOT peak VTI: 0.45 cm
LVOTSV: 55 mL
Lateral S' vel: 10.4 cm/s
MV Dec: 137
MV pk A vel: 94.6 m/s
MV pk E vel: 106 m/s
MVPG: 4 mmHg
P 1/2 time: 40 ms
PV Reg grad dias: 13 mmHg
PW: 17 mm — AB (ref 0.6–1.1)
TAPSE: 19.3 mm
TDI e' lateral: 4.9
Valve area index: 0.73

## 2016-02-25 NOTE — Telephone Encounter (Signed)
Called patient and spoke with husband, they were aware of the information for the bone marrow test next week on 02-29-16. Review and verified information, voiced understanding.

## 2016-02-25 NOTE — Telephone Encounter (Signed)
Patient having echocardiogram done and per Dominica Severin the technician there are some changes from previous testing. He spoke with Dr. Rockey Situ the physician here in the office today and he recommended that we schedule patient to see Dr. Yvone Neu next week. Patient is scheduled to see her on 03/02/16. Patients spouse states that he didn't know that she had taken the pain pill and that he feels this is what is causing her symptoms. Patient currently is asleep while echocardiogram is being performed. Instructed patients spouse again that if her symptoms worsen to please go to emergency room for evaluation. He was very appreciative and had no further questions at this time. Provided him with calendar of all upcoming appointments for February and March.

## 2016-02-25 NOTE — Telephone Encounter (Signed)
Patient in office for echo and experiencing dizziness and nausea .  Patient spouse says she was hot in the car  Olin Hauser rn aware and to come to front to triage

## 2016-02-25 NOTE — Telephone Encounter (Signed)
Patient here in the office and she had some dizziness, nausea, and just not feeling well. She denies any chest pain or shortness of breath at this time. After talking with both and reviewing her medications she states that she took a pain pill this morning. Her husband did not know this and states that this is what caused these symptoms. Let them know if symptoms persist or worsen to go to emergency room or check with her primary care physician. They verbalized understanding and have no further questions at this time.

## 2016-02-26 LAB — WOUND CULTURE

## 2016-02-28 ENCOUNTER — Encounter: Payer: Self-pay | Admitting: Hematology and Oncology

## 2016-02-28 ENCOUNTER — Other Ambulatory Visit: Payer: Self-pay | Admitting: Radiology

## 2016-02-29 ENCOUNTER — Ambulatory Visit
Admission: RE | Admit: 2016-02-29 | Discharge: 2016-02-29 | Disposition: A | Discharge status: Home / Self Care | Payer: Medicare Other | Source: Ambulatory Visit | Attending: Hematology and Oncology | Admitting: Hematology and Oncology

## 2016-02-29 ENCOUNTER — Ambulatory Visit: Payer: Medicare Other | Admitting: Cardiology

## 2016-02-29 ENCOUNTER — Other Ambulatory Visit (HOSPITAL_COMMUNITY)
Admission: RE | Admit: 2016-02-29 | Discharge: 2016-02-29 | Disposition: A | Discharge status: Home / Self Care | Payer: Medicare Other | Source: Ambulatory Visit | Attending: Cardiology | Admitting: Cardiology

## 2016-02-29 ENCOUNTER — Other Ambulatory Visit (HOSPITAL_COMMUNITY)
Admission: RE | Admit: 2016-02-29 | Disposition: A | Discharge status: Home / Self Care | Payer: Medicare Other | Source: Ambulatory Visit | Attending: Hematology and Oncology | Admitting: Hematology and Oncology

## 2016-02-29 DIAGNOSIS — I5022 Chronic systolic (congestive) heart failure: Secondary | ICD-10-CM | POA: Diagnosis not present

## 2016-02-29 DIAGNOSIS — D472 Monoclonal gammopathy: Secondary | ICD-10-CM | POA: Diagnosis not present

## 2016-02-29 DIAGNOSIS — D7589 Other specified diseases of blood and blood-forming organs: Secondary | ICD-10-CM | POA: Insufficient documentation

## 2016-02-29 DIAGNOSIS — D649 Anemia, unspecified: Secondary | ICD-10-CM | POA: Insufficient documentation

## 2016-02-29 DIAGNOSIS — D72822 Plasmacytosis: Secondary | ICD-10-CM | POA: Diagnosis not present

## 2016-02-29 LAB — BONE MARROW EXAM

## 2016-02-29 LAB — CBC WITH DIFFERENTIAL/PLATELET
BASOS ABS: 0.1 10*3/uL (ref 0–0.1)
Basophils Relative: 1 %
EOS ABS: 0.2 10*3/uL (ref 0–0.7)
EOS PCT: 3 %
HCT: 31.9 % — ABNORMAL LOW (ref 35.0–47.0)
Hemoglobin: 10.6 g/dL — ABNORMAL LOW (ref 12.0–16.0)
Lymphocytes Relative: 31 %
Lymphs Abs: 1.7 10*3/uL (ref 1.0–3.6)
MCH: 27.6 pg (ref 26.0–34.0)
MCHC: 33.1 g/dL (ref 32.0–36.0)
MCV: 83.4 fL (ref 80.0–100.0)
MONO ABS: 0.6 10*3/uL (ref 0.2–0.9)
Monocytes Relative: 11 %
Neutro Abs: 3 10*3/uL (ref 1.4–6.5)
Neutrophils Relative %: 54 %
PLATELETS: 224 10*3/uL (ref 150–440)
RBC: 3.83 MIL/uL (ref 3.80–5.20)
RDW: 17.5 % — AB (ref 11.5–14.5)
WBC: 5.4 10*3/uL (ref 3.6–11.0)

## 2016-02-29 LAB — PROTIME-INR
INR: 1.12
Prothrombin Time: 14.5 seconds (ref 11.4–15.2)

## 2016-02-29 LAB — APTT: APTT: 28 s (ref 24–36)

## 2016-02-29 MED ORDER — SODIUM CHLORIDE 0.9 % IV SOLN
INTRAVENOUS | Status: DC
  Administered 2016-02-29: 11:00:00 via INTRAVENOUS

## 2016-02-29 NOTE — Discharge Instructions (Signed)
Needle Biopsy of the Bone °A bone biopsy is a procedure in which a small sample of bone is removed. The sample is taken with a needle. Then, the bone sample is looked at under a microscope to check for abnormalities. The sample is usually taken from a bone that is close to the skin. This procedure may be done to check for various problems with the bone. You may need this procedure if imaging tests or blood tests have indicated a possible problem. This procedure may be done to help determine if a bone tumor is cancerous (malignant). A bone biopsy can help to diagnose problems such as: °· Tumors of the bone (sarcomas) and bone marrow (multiple myeloma). °· Bone that forms abnormally (Paget disease). °· Noncancerous (benign) bone cysts. °· Bony growths. °· Infections in the bone. °Tell a health care provider about: °· Any allergies you have. °· All medicines you are taking, including vitamins, herbs, eye drops, creams, and over-the-counter medicines. °· Any problems you or family members have had with anesthetic medicines. °· Any blood disorders you have. °· Any surgeries you have had. °· Any medical conditions you have. °What are the risks? °Generally, this is a safe procedure. However, problems may occur, including: °· Excessive bleeding. °· Infection. °· Injury to surrounding tissue. °What happens before the procedure? °· Ask your health care provider about: °¨ Changing or stopping your regular medicines. This is especially important if you are taking diabetes medicines or blood thinners. °¨ Taking medicines such as aspirin and ibuprofen. These medicines can thin your blood. Do not take these medicines before your procedure if your health care provider instructs you not to. °· Follow instructions from your health care provider about eating or drinking restrictions. °· Plan to have someone take you home after the procedure. °· If you go home right after the procedure, plan to have someone with you for 24 hours. °What  happens during the procedure? °· An IV tube may be inserted into one of your veins. °· The injection site will be cleaned with a germ-killing solution (antiseptic). °· You will be given one or more of the following: °¨ A medicine to help you relax (sedative). °¨ A medicine to numb the area (local anesthetic). °· The sample of bone will be removed by putting a large needle through the skin and into the bone. °· The needle will be removed. °· A bandage (dressing) will be placed over the insertion site and taped in place. °The procedure may vary among health care providers and hospitals. °What happens after the procedure? °· Your blood pressure, heart rate, breathing rate, and blood oxygen level will be monitored often until the medicines you were given have worn off. °· Return to your normal activities as told by your health care provider. °This information is not intended to replace advice given to you by your health care provider. Make sure you discuss any questions you have with your health care provider. °Document Released: 10/28/2003 Document Revised: 05/27/2015 Document Reviewed: 01/26/2014 °Elsevier Interactive Patient Education © 2017 [employer] ° °

## 2016-02-29 NOTE — OR Nursing (Signed)
Dr Golden Circle informed of pt reported pain, oked giving pt home pain pill from husband (half tablet given by husband).

## 2016-02-29 NOTE — Procedures (Signed)
CT bone marrow biopsy without difficulty  Complications:  None  Blood Loss: none  See dictation in canopy pacs

## 2016-03-01 ENCOUNTER — Telehealth: Payer: Self-pay

## 2016-03-01 DIAGNOSIS — L03115 Cellulitis of right lower limb: Secondary | ICD-10-CM

## 2016-03-01 MED ORDER — DOXYCYCLINE HYCLATE 100 MG PO TABS: 100.0000 mg | ORAL_TABLET | Freq: Two times a day (BID) | ORAL | 0 refills | Status: DC

## 2016-03-01 NOTE — Telephone Encounter (Signed)
-----   Message from Birdie Sons, MD sent at 03/01/2016  8:12 AM EST ----- Skin cultures shows a mix of several bacteria causing infection of skin. Need to stay on antibiotic until wound heals. Please send in refill doxycycline 100mg  bid x 10 days and follow up March 15th, as scheduled.

## 2016-03-02 ENCOUNTER — Other Ambulatory Visit: Payer: Self-pay | Admitting: Cardiology

## 2016-03-02 ENCOUNTER — Encounter: Payer: Self-pay | Admitting: Cardiology

## 2016-03-02 ENCOUNTER — Ambulatory Visit (INDEPENDENT_AMBULATORY_CARE_PROVIDER_SITE_OTHER): Payer: Medicare Other | Admitting: Cardiology

## 2016-03-02 VITALS — BP 132/88 | HR 88 | Ht 61.0 in | Wt 203.0 lb

## 2016-03-02 DIAGNOSIS — Z01810 Encounter for preprocedural cardiovascular examination: Secondary | ICD-10-CM

## 2016-03-02 DIAGNOSIS — I429 Cardiomyopathy, unspecified: Secondary | ICD-10-CM | POA: Diagnosis not present

## 2016-03-02 DIAGNOSIS — I10 Essential (primary) hypertension: Secondary | ICD-10-CM | POA: Diagnosis not present

## 2016-03-02 DIAGNOSIS — I428 Other cardiomyopathies: Secondary | ICD-10-CM

## 2016-03-02 DIAGNOSIS — I5022 Chronic systolic (congestive) heart failure: Secondary | ICD-10-CM

## 2016-03-02 DIAGNOSIS — I350 Nonrheumatic aortic (valve) stenosis: Secondary | ICD-10-CM | POA: Diagnosis not present

## 2016-03-02 MED ORDER — METOPROLOL TARTRATE 50 MG PO TABS: 50.0000 mg | ORAL_TABLET | Freq: Two times a day (BID) | ORAL | 3 refills | Status: DC

## 2016-03-02 MED ORDER — ASPIRIN EC 81 MG PO TBEC: 81.0000 mg | DELAYED_RELEASE_TABLET | Freq: Every day | ORAL | 3 refills | Status: DC

## 2016-03-02 NOTE — Progress Notes (Signed)
Cardiology Office Note   Date:  03/02/2016   ID:  [name], DOB [birth year], MRN [id number]  Referring Doctor:  Lelon Huh, MD   Cardiologist:   Wende Bushy, MD   Reason for consultation:  Chief Complaint  Patient presents with  . other    Follow up Echo as well as a 2 week follow up.  Pt. was at [facility] on 02/14/2016 with HTN. Meds reviewed by the pt.'s bottles.        History of Present Illness: [name] is a 71 y.o. female who presents forFollow-up for CHF, aortic stenosis  Since last visit, patient has been doing fairly okay. She has noticed significant decrease in her weight. She is not very active due to significant leg pains and bone pains that stop her from doing much more. She needs to see a lot of different physicians, more recently hematology for her gammopathy. She doesn't like it that she has to pee a lot when she takes her Lasix but she is okay to take it.  Recently, the patient ended up in the ER due to significantly elevated blood pressure. Some reason, she was not taking lisinopril, there was some confusion with her medications. She is now back on that. PCP also added Aldactone. She is due for recheck and blood work in about a week or so.  Patient denies chest pain. Her shortness of breath was about the same as before with minimal activity. She sleeps in the recliner more for convenience and comfort. Edema has decreased.   ROS:  Please see the history of present illness. Aside from mentioned under HPI, all other systems are reviewed and negative.    Past Medical History:  Diagnosis Date  . Asthma   . Diabetes mellitus without complication ([place])   . History of measles   . Hypertension     Past Surgical History:  Procedure Laterality Date  . Cyst(solitary) of breast:removed    . TUBAL LIGATION       reports that she has never smoked. She has never used smokeless tobacco. She reports that she does not drink alcohol or use drugs.   family  history includes Cancer in her mother and sister; Diabetes in her brother, mother, and sister; Heart disease in her mother; Heart failure in her mother; Hypertension in her sister; Mesothelioma in her brother; Parkinson's disease in her father.   Outpatient Medications Prior to Visit  Medication Sig Dispense Refill  . ADVAIR HFA 115-21 MCG/ACT inhaler Inhale 2 puffs into the lungs as needed.     Marland Kitchen albuterol (PROAIR HFA) 108 (90 Base) MCG/ACT inhaler Inhale 2 puffs into the lungs every 6 (six) hours as needed. 18 Inhaler 3  . doxycycline (VIBRA-TABS) 100 MG tablet Take 1 tablet (100 mg total) by mouth 2 (two) times daily. 20 tablet 0  . fluticasone furoate-vilanterol (BREO ELLIPTA) 200-25 MCG/INH AEPB Inhale 1 puff into the lungs daily. FOR ASTHMA J45.20 1 each 5  . furosemide (LASIX) 20 MG tablet Take 2 tablets (40 mg total) by mouth daily. 60 tablet 0  . HYDROcodone-acetaminophen (NORCO) 7.5-325 MG tablet Take 1 tablet by mouth every 6 (six) hours as needed for moderate pain. 60 tablet 0  . levalbuterol (XOPENEX) 1.25 MG/3ML nebulizer solution Take 1.25 mg by nebulization every 8 (eight) hours as needed for wheezing. 72 mL 3  . lisinopril (PRINIVIL,ZESTRIL) 40 MG tablet Take 1 tablet (40 mg total) by mouth daily. 30 tablet 1  . lovastatin (  MEVACOR) 20 MG tablet Take 1 tablet (20 mg total) by mouth at bedtime. 90 tablet 4  . meloxicam (MOBIC) 15 MG tablet Take 1 tablet (15 mg total) by mouth daily as needed for pain. Take with food 90 tablet 4  . metFORMIN (GLUCOPHAGE-XR) 750 MG 24 hr tablet Take 2 tablets (1,500 mg total) by mouth daily. 180 tablet 4  . montelukast (SINGULAIR) 10 MG tablet Take 1 tablet (10 mg total) by mouth daily. For chronic cough 90 tablet 4  . mupirocin ointment (BACTROBAN) 2 % Place 1 application into the nose 2 (two) times daily. 22 g 0  . spironolactone (ALDACTONE) 25 MG tablet Take 1 tablet (25 mg total) by mouth daily. 30 tablet 2  . traZODone (DESYREL) 100 MG tablet  TAKE 1 TABLET BY MOUTH EVERY NIGHT AT BEDTIME AS NEEDED FOR INSOMNIA 90 tablet 4  . metoprolol tartrate (LOPRESSOR) 25 MG tablet Take 1 tablet (25 mg total) by mouth 2 (two) times daily. 60 tablet 3   No facility-administered medications prior to visit.      Allergies: Hydrochlorothiazide and Penicillins    PHYSICAL EXAM: VS:  BP 132/88 (BP Location: Left Arm, Patient Position: Sitting, Cuff Size: Normal)   Pulse 88   Ht 5\' 1"  (1.549 m)   Wt 203 lb (92.1 kg)   BMI 38.36 kg/m  , Body mass index is 38.36 kg/m. Wt Readings from Last 3 Encounters:  03/02/16 203 lb (92.1 kg)  02/23/16 214 lb (97.1 kg)  02/21/16 216 lb (98 kg)    GENERAL:  well developed, well nourished, obese, not in acute distress HEENT: normocephalic, pink conjunctivae, anicteric sclerae, no xanthelasma, normal dentition, oropharynx clear NECK:  no neck vein engorgement, JVP normal, no hepatojugular reflux, carotid upstroke brisk and symmetric, no bruit, no thyromegaly, no lymphadenopathy LUNGS:  good respiratory effort, clear to auscultation bilaterally CV:  PMI not displaced, no thrills, no lifts, S1 and S2 within normal limits, no palpable S3 or S4, systolic ejection murmur not so much noticeable today compared to last visit , no rubs, no gallops ABD:  Soft, nontender, nondistended, normoactive bowel sounds, no abdominal aortic bruit, no hepatomegaly, no splenomegaly MS: nontender back, no kyphosis, no scoliosis, no joint deformities EXT:  2+ DP/PT pulses, +1 edema, no varicosities, no cyanosis, no clubbing SKIN: warm, nondiaphoretic, normal turgor, no ulcers NEUROPSYCH: alert, oriented to person, place, and time, sensory/motor grossly intact, normal mood, appropriate affect    Recent Labs: 01/25/2016: TSH 1.360 02/09/2016: B Natriuretic Peptide 1,592.0 02/14/2016: ALT 34 02/18/2016: BUN 24; Creatinine, Ser 0.95; Potassium 4.1; Sodium 138 02/29/2016: Hemoglobin 10.6; Platelets 224   Lipid Panel    Component  Value Date/Time   CHOL 139 06/25/2015 0959   TRIG 64 06/25/2015 0959   HDL 59 06/25/2015 0959   CHOLHDL 2.4 06/25/2015 0959   LDLCALC 67 06/25/2015 0959     Other studies Reviewed:  EKG:  The ekg from 02/01/2016 was personally reviewed by me and it revealed sinus rhythm, 90 BPM PACs nonspecific ST-T wave changes. QT 360 ms, QTC 413 ms.  Additional studies/ records that were reviewed personally reviewed by me today include:  Echo 08/19/2015: Left ventricle: There was moderate concentric hypertrophy.   Systolic function was normal. The estimated ejection fraction was   in the range of 50% to 55%. - Aortic valve: There was moderate stenosis. Valve area (VTI): 1.39   cm^2. Valve area (Vmax): 1.11 cm^2. Valve area (Vmean): 1.02   cm^2. - Mitral valve: There  was moderate regurgitation. - Left atrium: The atrium was mildly dilated. - Right atrium: The atrium was mildly dilated. - Pericardium, extracardiac: A trivial pericardial effusion was   identified posterior to the heart.  Echo 02/25/2016: Left ventricle: The cavity size was normal. There was moderate   concentric hypertrophy. Systolic function was severely reduced.   The estimated ejection fraction was in the range of 25% to 30%.   Diffuse hypokinesis. Regional wall motion abnormalities cannot be   excluded. Features are consistent with a pseudonormal left   ventricular filling pattern, with concomitant abnormal relaxation   and increased filling pressure (grade 2 diastolic dysfunction). - Aortic valve: Transvalvular velocity was elevated.   Degree of aortic valve stenosis is likely underestimated   secondary to severely depressed EF. Valve area (VTI): 1.41 cm^2. - Mitral valve: Calcified annulus. There was mild regurgitation. - Left atrium: The atrium was mildly dilated. - Right ventricle: Systolic function was mildly to moderately   reduced. - Tricuspid valve: There was mild-moderate regurgitation. - Pulmonary arteries:  Systolic pressure was moderately elevated. PA   peak pressure: 55 mm Hg (S).   ASSESSMENT AND PLAN: Congestive heart failure, now systolic dysfunction, chronic Improvement in volume status. For now, continue Lasix at 40 mg daily. Continue medical therapy with aspirin 81 mg by mouth daily, Lisinopril 40, metoprolol increased to 50 twice a day. Patient does not want to try Coreg due to her history of asthma. Agree with Spironolactone 25mg  po qd.  PCP has ordered repeat blood work in about a week or so. Due to depressed ejection fraction, discussed importance of ruling out coronary artery disease. Recommend right and left heart catheterization. Risks and benefits of cardiac catheterization have been discussed with the patient.  These include less than 1% chance of major complications including but not limited to bleeding, infection, kidney damage, arrhyhthmia, heart attack, stroke, and death.  The patient understands these risks and is willing to proceed. We will need to check with hematology the prognosis of her monoclonal gammopathy, if she needs to go for surgical revascularization.  Continue daily weights and low sodium diet.  If weight gain of > 2 lbs over 24 hours, or > 5 lbs over 1 week, please call office.   Aortic stenosis She'll go for recommend left heart cath.  Hypertension BP is well controlled. Continue monitoring BP. Continue current medical therapy and lifestyle changes.  Current medicines are reviewed at length with the patient today.  The patient does not have concerns regarding medicines.  Labs/ tests ordered today include:  Orders Placed This Encounter  Procedures  . CBC with Differential/Platelet  . Basic metabolic panel  . INR/PT     Disposition:   FU with undersigned after tests  I spent at least 40 minutes with the patient today and more than 50% of the time was spent counseling the patient and coordinating care.    Signed, Wende Bushy, MD  03/02/2016 5:17  PM    [place]  This note was generated in part with voice recognition software and I apologize for any typographical errors that were not detected and corrected.

## 2016-03-02 NOTE — Patient Instructions (Addendum)
Medication Instructions:  Your physician has recommended you make the following change in your medication:  1. START Metoprolol 50 mg twice a day 2. START Aspirin 81 mg Once daily   Labwork: Labs today for procedure. We will call you with results.   Testing/Procedures: [facility] Cardiac Cath Instructions   You are scheduled for a Cardiac Cath on:_Tuesday March 6, 2018__  Please arrive at _08:30_am on the day of your procedure  Please expect a call from our [place] to pre-register you  Do not eat/drink anything after midnight  Someone will need to drive you home  It is recommended someone be with you for the first 24 hours after your procedure  Wear clothes that are easy to get on/off and wear slip on shoes if possible   Medications bring a current list of all medications with you  _X__ Do not take these medications before your procedure: Furosemide, Lisinopril, Metoprolol, and spironolactone   HOLD Metformin 24 hours before procedure  Day of your procedure: Arrive at the [facility] entrance.  Free valet service is available.  After entering the [facility] please check-in at the registration desk (1st desk on your right) to receive your armband. After receiving your armband someone will escort you to the cardiac cath/special procedures waiting area.  The usual length of stay after your procedure is about 2 to 3 hours.  This can vary.  If you have any questions, please call our office at [phone], or you may call the cardiac cath lab at [facility] directly at [phone]   Follow-Up: Your physician recommends that you schedule a follow-up appointment in: 1 week after your procedure.   It was a pleasure seeing you today here in the office. Please do not hesitate to give [place] a call back if you have any further questions. [place], BSN

## 2016-03-03 LAB — CBC WITH DIFFERENTIAL/PLATELET
BASOS ABS: 0 10*3/uL (ref 0.0–0.2)
Basos: 0 %
EOS (ABSOLUTE): 0.2 10*3/uL (ref 0.0–0.4)
Eos: 3 %
HEMOGLOBIN: 9.7 g/dL — AB (ref 11.1–15.9)
Hematocrit: 31.5 % — ABNORMAL LOW (ref 34.0–46.6)
IMMATURE GRANS (ABS): 0 10*3/uL (ref 0.0–0.1)
IMMATURE GRANULOCYTES: 0 %
LYMPHS: 35 %
Lymphocytes Absolute: 2.2 10*3/uL (ref 0.7–3.1)
MCH: 26.4 pg — ABNORMAL LOW (ref 26.6–33.0)
MCHC: 30.8 g/dL — ABNORMAL LOW (ref 31.5–35.7)
MCV: 86 fL (ref 79–97)
MONOCYTES: 9 %
Monocytes Absolute: 0.6 10*3/uL (ref 0.1–0.9)
Neutrophils Absolute: 3.3 10*3/uL (ref 1.4–7.0)
Neutrophils: 53 %
PLATELETS: 244 10*3/uL (ref 150–379)
RBC: 3.68 x10E6/uL — ABNORMAL LOW (ref 3.77–5.28)
RDW: 16.2 % — ABNORMAL HIGH (ref 12.3–15.4)
WBC: 6.3 10*3/uL (ref 3.4–10.8)

## 2016-03-03 LAB — BASIC METABOLIC PANEL
BUN / CREAT RATIO: 24 (ref 12–28)
BUN: 24 mg/dL (ref 8–27)
CALCIUM: 9.6 mg/dL (ref 8.7–10.3)
CHLORIDE: 101 mmol/L (ref 96–106)
CO2: 26 mmol/L (ref 18–29)
CREATININE: 1.02 mg/dL — AB (ref 0.57–1.00)
GFR calc Af Amer: 64 mL/min/{1.73_m2} (ref >59)
GFR calc non Af Amer: 55 mL/min/{1.73_m2} — ABNORMAL LOW (ref >59)
GLUCOSE: 110 mg/dL — AB (ref 65–99)
Potassium: 4.2 mmol/L (ref 3.5–5.2)
Sodium: 141 mmol/L (ref 134–144)

## 2016-03-03 LAB — PROTIME-INR
INR: 1 (ref 0.8–1.2)
PROTHROMBIN TIME: 11.1 s (ref 9.1–12.0)

## 2016-03-06 ENCOUNTER — Telehealth: Payer: Self-pay | Admitting: Internal Medicine

## 2016-03-06 NOTE — Telephone Encounter (Signed)
Spoke w/ pt's husband to remind him of cath scheduled tomorrow w/ Dr. Saunders Revel.  He states that pt will arrive @ 8:30 and read the instructions to me. He is appreciative of the call and will call back w/ any questions or concerns.

## 2016-03-07 ENCOUNTER — Telehealth: Payer: Self-pay | Admitting: Cardiology

## 2016-03-07 ENCOUNTER — Encounter: Payer: Self-pay | Admitting: *Deleted

## 2016-03-07 ENCOUNTER — Ambulatory Visit
Admission: RE | Admit: 2016-03-07 | Discharge: 2016-03-07 | Disposition: A | Discharge status: Home / Self Care | Payer: Medicare Other | Source: Ambulatory Visit | Attending: Internal Medicine | Admitting: Internal Medicine

## 2016-03-07 ENCOUNTER — Encounter
Admission: RE | Disposition: A | Discharge status: Home / Self Care | Payer: Self-pay | Source: Ambulatory Visit | Attending: Internal Medicine

## 2016-03-07 DIAGNOSIS — Z7982 Long term (current) use of aspirin: Secondary | ICD-10-CM | POA: Insufficient documentation

## 2016-03-07 DIAGNOSIS — I110 Hypertensive heart disease with heart failure: Secondary | ICD-10-CM | POA: Diagnosis not present

## 2016-03-07 DIAGNOSIS — Z880 Allergy status to penicillin: Secondary | ICD-10-CM | POA: Insufficient documentation

## 2016-03-07 DIAGNOSIS — J45909 Unspecified asthma, uncomplicated: Secondary | ICD-10-CM | POA: Diagnosis not present

## 2016-03-07 DIAGNOSIS — Z7984 Long term (current) use of oral hypoglycemic drugs: Secondary | ICD-10-CM | POA: Diagnosis not present

## 2016-03-07 DIAGNOSIS — I5022 Chronic systolic (congestive) heart failure: Secondary | ICD-10-CM | POA: Insufficient documentation

## 2016-03-07 DIAGNOSIS — I350 Nonrheumatic aortic (valve) stenosis: Secondary | ICD-10-CM | POA: Diagnosis not present

## 2016-03-07 DIAGNOSIS — E119 Type 2 diabetes mellitus without complications: Secondary | ICD-10-CM | POA: Insufficient documentation

## 2016-03-07 DIAGNOSIS — Z79899 Other long term (current) drug therapy: Secondary | ICD-10-CM | POA: Insufficient documentation

## 2016-03-07 DIAGNOSIS — I2510 Atherosclerotic heart disease of native coronary artery without angina pectoris: Secondary | ICD-10-CM | POA: Diagnosis not present

## 2016-03-07 DIAGNOSIS — I428 Other cardiomyopathies: Secondary | ICD-10-CM | POA: Insufficient documentation

## 2016-03-07 DIAGNOSIS — I509 Heart failure, unspecified: Secondary | ICD-10-CM | POA: Diagnosis present

## 2016-03-07 DIAGNOSIS — I272 Pulmonary hypertension, unspecified: Secondary | ICD-10-CM | POA: Diagnosis not present

## 2016-03-07 DIAGNOSIS — Z8619 Personal history of other infectious and parasitic diseases: Secondary | ICD-10-CM | POA: Insufficient documentation

## 2016-03-07 DIAGNOSIS — Z7951 Long term (current) use of inhaled steroids: Secondary | ICD-10-CM | POA: Diagnosis not present

## 2016-03-07 DIAGNOSIS — Z8249 Family history of ischemic heart disease and other diseases of the circulatory system: Secondary | ICD-10-CM | POA: Diagnosis not present

## 2016-03-07 DIAGNOSIS — L03115 Cellulitis of right lower limb: Secondary | ICD-10-CM

## 2016-03-07 HISTORY — PX: RIGHT/LEFT HEART CATH AND CORONARY ANGIOGRAPHY: CATH118266

## 2016-03-07 LAB — POCT ACTIVATED CLOTTING TIME: ACTIVATED CLOTTING TIME: 164 s

## 2016-03-07 SURGERY — RIGHT/LEFT HEART CATH AND CORONARY ANGIOGRAPHY
Anesthesia: Moderate Sedation

## 2016-03-07 MED ORDER — METOPROLOL TARTRATE 50 MG PO TABS: 50.0000 mg | ORAL_TABLET | Freq: Two times a day (BID) | ORAL | Status: DC

## 2016-03-07 MED ORDER — DOXYCYCLINE HYCLATE 100 MG PO TABS: 100.0000 mg | ORAL_TABLET | Freq: Two times a day (BID) | ORAL | Status: DC

## 2016-03-07 MED ORDER — SODIUM CHLORIDE 0.9 % IV SOLN
INTRAVENOUS | Status: DC
  Administered 2016-03-07: 09:00:00 via INTRAVENOUS

## 2016-03-07 MED ORDER — SODIUM CHLORIDE 0.9 % IV SOLN: 250.0000 mL | INTRAVENOUS | Status: DC | PRN

## 2016-03-07 MED ORDER — ALPRAZOLAM 0.5 MG PO TABS
ORAL_TABLET | ORAL | Status: AC
  Filled 2016-03-07: qty 1

## 2016-03-07 MED ORDER — VERAPAMIL HCL 2.5 MG/ML IV SOLN
INTRAVENOUS | Status: DC | PRN
  Administered 2016-03-07: 3 mg via INTRAVENOUS

## 2016-03-07 MED ORDER — FENTANYL CITRATE (PF) 100 MCG/2ML IJ SOLN
INTRAMUSCULAR | Status: DC | PRN
  Administered 2016-03-07 (×3): 25 ug via INTRAVENOUS

## 2016-03-07 MED ORDER — SODIUM CHLORIDE 0.9% FLUSH: 3.0000 mL | Freq: Two times a day (BID) | INTRAVENOUS | Status: DC

## 2016-03-07 MED ORDER — FENTANYL CITRATE (PF) 100 MCG/2ML IJ SOLN
INTRAMUSCULAR | Status: AC
  Filled 2016-03-07: qty 2

## 2016-03-07 MED ORDER — AMLODIPINE BESYLATE 5 MG PO TABS: 5.0000 mg | ORAL_TABLET | Freq: Every day | ORAL | 5 refills | Status: DC

## 2016-03-07 MED ORDER — HYDRALAZINE HCL 20 MG/ML IJ SOLN
INTRAMUSCULAR | Status: AC
  Filled 2016-03-07: qty 1

## 2016-03-07 MED ORDER — ALBUTEROL SULFATE HFA 108 (90 BASE) MCG/ACT IN AERS: 2.0000 | INHALATION_SPRAY | Freq: Four times a day (QID) | RESPIRATORY_TRACT | Status: DC | PRN

## 2016-03-07 MED ORDER — MONTELUKAST SODIUM 10 MG PO TABS: 10.0000 mg | ORAL_TABLET | Freq: Every day | ORAL | Status: DC

## 2016-03-07 MED ORDER — IOPAMIDOL (ISOVUE-300) INJECTION 61%
INTRAVENOUS | Status: DC | PRN
  Administered 2016-03-07: 65 mL via INTRA_ARTERIAL

## 2016-03-07 MED ORDER — HYDROCODONE-ACETAMINOPHEN 7.5-325 MG PO TABS: 1.0000 | ORAL_TABLET | Freq: Four times a day (QID) | ORAL | Status: DC | PRN

## 2016-03-07 MED ORDER — SPIRONOLACTONE 25 MG PO TABS: 25.0000 mg | ORAL_TABLET | Freq: Every day | ORAL | Status: DC

## 2016-03-07 MED ORDER — HEPARIN SODIUM (PORCINE) 1000 UNIT/ML IJ SOLN
INTRAMUSCULAR | Status: AC
  Filled 2016-03-07: qty 1

## 2016-03-07 MED ORDER — VERAPAMIL HCL 2.5 MG/ML IV SOLN
INTRAVENOUS | Status: AC
  Filled 2016-03-07: qty 2

## 2016-03-07 MED ORDER — SODIUM CHLORIDE 0.9% FLUSH: 3.0000 mL | INTRAVENOUS | Status: DC | PRN

## 2016-03-07 MED ORDER — ALPRAZOLAM 0.5 MG PO TABS
0.5000 mg | ORAL_TABLET | Freq: Once | ORAL | Status: AC
  Administered 2016-03-07: 0.5 mg via ORAL

## 2016-03-07 MED ORDER — FLUTICASONE FUROATE-VILANTEROL 200-25 MCG/INH IN AEPB: 1.0000 | INHALATION_SPRAY | Freq: Every day | RESPIRATORY_TRACT | Status: DC

## 2016-03-07 MED ORDER — MUPIROCIN 2 % EX OINT: 1.0000 "application " | TOPICAL_OINTMENT | Freq: Two times a day (BID) | CUTANEOUS | Status: DC

## 2016-03-07 MED ORDER — HYDROMORPHONE HCL 1 MG/ML IJ SOLN
0.5000 mg | Freq: Once | INTRAMUSCULAR | Status: DC
Start: 2016-03-07 — End: 2016-03-08

## 2016-03-07 MED ORDER — FUROSEMIDE 40 MG PO TABS: 40.0000 mg | ORAL_TABLET | Freq: Two times a day (BID) | ORAL | Status: DC

## 2016-03-07 MED ORDER — MIDAZOLAM HCL 2 MG/2ML IJ SOLN
INTRAMUSCULAR | Status: DC | PRN
  Administered 2016-03-07: 0.5 mg via INTRAVENOUS

## 2016-03-07 MED ORDER — HYDRALAZINE HCL 20 MG/ML IJ SOLN
INTRAMUSCULAR | Status: DC | PRN
  Administered 2016-03-07 (×2): 10 mg via INTRAVENOUS

## 2016-03-07 MED ORDER — MIDAZOLAM HCL 2 MG/2ML IJ SOLN
INTRAMUSCULAR | Status: AC
  Filled 2016-03-07: qty 2

## 2016-03-07 MED ORDER — ALBUTEROL SULFATE (5 MG/ML) 0.5% IN NEBU: 2.5000 mg | INHALATION_SOLUTION | RESPIRATORY_TRACT | Status: DC

## 2016-03-07 MED ORDER — TRAZODONE HCL 100 MG PO TABS: 100.0000 mg | ORAL_TABLET | Freq: Every day | ORAL | Status: DC

## 2016-03-07 MED ORDER — HEPARIN SODIUM (PORCINE) 1000 UNIT/ML IJ SOLN
INTRAMUSCULAR | Status: DC | PRN
  Administered 2016-03-07: 4500 [IU] via INTRAVENOUS

## 2016-03-07 MED ORDER — LISINOPRIL 10 MG PO TABS: 40.0000 mg | ORAL_TABLET | Freq: Every day | ORAL | Status: DC

## 2016-03-07 MED ORDER — MOMETASONE FURO-FORMOTEROL FUM 200-5 MCG/ACT IN AERO: 2.0000 | INHALATION_SPRAY | Freq: Two times a day (BID) | RESPIRATORY_TRACT | Status: DC

## 2016-03-07 MED ORDER — PRAVASTATIN SODIUM 40 MG PO TABS: 40.0000 mg | ORAL_TABLET | Freq: Every day | ORAL | Status: DC

## 2016-03-07 MED ORDER — FUROSEMIDE 40 MG PO TABS: 40.0000 mg | ORAL_TABLET | Freq: Two times a day (BID) | ORAL | 0 refills | Status: DC

## 2016-03-07 MED ORDER — HEPARIN (PORCINE) IN NACL 2-0.9 UNIT/ML-% IJ SOLN
INTRAMUSCULAR | Status: AC
  Filled 2016-03-07: qty 500

## 2016-03-07 MED ORDER — HYDRALAZINE HCL 20 MG/ML IJ SOLN
10.0000 mg | Freq: Once | INTRAMUSCULAR | Status: AC
Start: 2016-03-07 — End: 2016-03-07
  Administered 2016-03-07: 20 mg via INTRAVENOUS

## 2016-03-07 MED ORDER — ASPIRIN EC 81 MG PO TBEC: 81.0000 mg | DELAYED_RELEASE_TABLET | Freq: Every day | ORAL | Status: DC

## 2016-03-07 MED ORDER — HYDROMORPHONE HCL 1 MG/ML IJ SOLN
INTRAMUSCULAR | Status: AC
  Administered 2016-03-07: 0.5 mg
  Filled 2016-03-07: qty 0.5

## 2016-03-07 SURGICAL SUPPLY — 22 items
CANNULA 5F STIFF (CANNULA) ×2 IMPLANT
CATH BALLN WEDGE 5F 110CM (CATHETERS) ×2 IMPLANT
CATH INFINITI 5 FR 3DRC (CATHETERS) ×2 IMPLANT
CATH INFINITI 5 FR JR3.5 (CATHETERS) ×2 IMPLANT
CATH INFINITI 5FR JL4 (CATHETERS) ×2 IMPLANT
CATH INFINITI JR4 5F (CATHETERS) ×2 IMPLANT
CATH LANGSTON DUAL LUM PIG 6FR (CATHETERS) ×2 IMPLANT
DEVICE RAD TR BAND REGULAR (VASCULAR PRODUCTS) ×2 IMPLANT
GLIDESHEATH SLEND SS 6F .021 (SHEATH) ×4 IMPLANT
GUIDEWIRE EMER 3M J .025X150CM (WIRE) ×2 IMPLANT
KIT MANI 3VAL PERCEP (MISCELLANEOUS) ×3 IMPLANT
KIT RIGHT HEART (MISCELLANEOUS) ×2 IMPLANT
KIT TRANSPAC II SGL 4260605 (MISCELLANEOUS) ×2 IMPLANT
NDL PERC 18GX7CM (NEEDLE) IMPLANT
NEEDLE PERC 18GX7CM (NEEDLE) IMPLANT
PACK CARDIAC CATH (CUSTOM PROCEDURE TRAY) ×3 IMPLANT
SHEATH AVANTI 5FR X 11CM (SHEATH) IMPLANT
SHEATH AVANTI 6FR X 11CM (SHEATH) ×2 IMPLANT
WIRE EMERALD 3MM-J .035X260CM (WIRE) ×2 IMPLANT
WIRE EMERALD ST .035X150CM (WIRE) ×2 IMPLANT
WIRE HITORQ VERSACORE ST 145CM (WIRE) ×2 IMPLANT
WIRE ROSEN-J .035X260CM (WIRE) ×2 IMPLANT

## 2016-03-07 NOTE — Telephone Encounter (Signed)
Dr. Saunders Revel requested that I put an order in for patient to have repeat labs on Friday 03/10/16 over at the [facility] [place] [facility] to the hospital. Attempted to call patients home number to leave voicemail message but there is no answering machine. Will try to call back tomorrow.

## 2016-03-07 NOTE — Brief Op Note (Signed)
Brief Cardiac Catheterization Note (Full Report to Follow)  Date: 03/07/2016 Time: 11:47 AM  PATIENT:  [name]  71 y.o. female  PRE-OPERATIVE DIAGNOSIS:  Systolic heart failure, aortic stenosis  POST-OPERATIVE DIAGNOSIS: Non-obstructive coronary artery disease, severe systemic and pulmonary hypertension  PROCEDURE:  Procedure(s): Right/Left Heart Cath and Coronary Angiography (N/A)  SURGEON:  Surgeon(s) and Role:    * Nelva Bush, MD - Primary  FINDINGS: 1.  Non-obstructive coronary artery disease. 2.  Severe systemic and pulmonary hypertension. 3.  Moderate to severely elevated right heart filling pressure. 4.  Moderately elevated left heart filling pressure. 5.    RECOMMENDATIONS: 1.  Aggressive medical management of non-ischemic cardiomyopathy, pulmonary hypertension, and systemic hypertension. 2.  Risk factor modification and medical therapy of non-obstructive CAD.  Nelva Bush, MD [facility] Pager: [phone]

## 2016-03-07 NOTE — H&P (View-Only) (Signed)
Cardiology Office Note   Date:  03/02/2016   ID:  [name], DOB 07-Jul-1945, MRN [id number]  Referring Doctor:  Lelon Huh, MD   Cardiologist:   Wende Bushy, MD   Reason for consultation:  Chief Complaint  Patient presents with  . other    Follow up Echo as well as a 2 week follow up.  Pt. was at [facility] on 02/14/2016 with HTN. Meds reviewed by the pt.'s bottles.        History of Present Illness: [name] is a 71 y.o. female who presents forFollow-up for CHF, aortic stenosis  Since last visit, patient has been doing fairly okay. She has noticed significant decrease in her weight. She is not very active due to significant leg pains and bone pains that stop her from doing much more. She needs to see a lot of different physicians, more recently hematology for her gammopathy. She doesn't like it that she has to pee a lot when she takes her Lasix but she is okay to take it.  Recently, the patient ended up in the ER due to significantly elevated blood pressure. Some reason, she was not taking lisinopril, there was some confusion with her medications. She is now back on that. PCP also added Aldactone. She is due for recheck and blood work in about a week or so.  Patient denies chest pain. Her shortness of breath was about the same as before with minimal activity. She sleeps in the recliner more for convenience and comfort. Edema has decreased.   ROS:  Please see the history of present illness. Aside from mentioned under HPI, all other systems are reviewed and negative.    Past Medical History:  Diagnosis Date  . Asthma   . Diabetes mellitus without complication ([place])   . History of measles   . Hypertension     Past Surgical History:  Procedure Laterality Date  . Cyst(solitary) of breast:removed    . TUBAL LIGATION       reports that she has never smoked. She has never used smokeless tobacco. She reports that she does not drink alcohol or use drugs.   family  history includes Cancer in her mother and sister; Diabetes in her brother, mother, and sister; Heart disease in her mother; Heart failure in her mother; Hypertension in her sister; Mesothelioma in her brother; Parkinson's disease in her father.   Outpatient Medications Prior to Visit  Medication Sig Dispense Refill  . ADVAIR HFA 115-21 MCG/ACT inhaler Inhale 2 puffs into the lungs as needed.     Marland Kitchen albuterol (PROAIR HFA) 108 (90 Base) MCG/ACT inhaler Inhale 2 puffs into the lungs every 6 (six) hours as needed. 18 Inhaler 3  . doxycycline (VIBRA-TABS) 100 MG tablet Take 1 tablet (100 mg total) by mouth 2 (two) times daily. 20 tablet 0  . fluticasone furoate-vilanterol (BREO ELLIPTA) 200-25 MCG/INH AEPB Inhale 1 puff into the lungs daily. FOR ASTHMA J45.20 1 each 5  . furosemide (LASIX) 20 MG tablet Take 2 tablets (40 mg total) by mouth daily. 60 tablet 0  . HYDROcodone-acetaminophen (NORCO) 7.5-325 MG tablet Take 1 tablet by mouth every 6 (six) hours as needed for moderate pain. 60 tablet 0  . levalbuterol (XOPENEX) 1.25 MG/3ML nebulizer solution Take 1.25 mg by nebulization every 8 (eight) hours as needed for wheezing. 72 mL 3  . lisinopril (PRINIVIL,ZESTRIL) 40 MG tablet Take 1 tablet (40 mg total) by mouth daily. 30 tablet 1  . lovastatin (  MEVACOR) 20 MG tablet Take 1 tablet (20 mg total) by mouth at bedtime. 90 tablet 4  . meloxicam (MOBIC) 15 MG tablet Take 1 tablet (15 mg total) by mouth daily as needed for pain. Take with food 90 tablet 4  . metFORMIN (GLUCOPHAGE-XR) 750 MG 24 hr tablet Take 2 tablets (1,500 mg total) by mouth daily. 180 tablet 4  . montelukast (SINGULAIR) 10 MG tablet Take 1 tablet (10 mg total) by mouth daily. For chronic cough 90 tablet 4  . mupirocin ointment (BACTROBAN) 2 % Place 1 application into the nose 2 (two) times daily. 22 g 0  . spironolactone (ALDACTONE) 25 MG tablet Take 1 tablet (25 mg total) by mouth daily. 30 tablet 2  . traZODone (DESYREL) 100 MG tablet  TAKE 1 TABLET BY MOUTH EVERY NIGHT AT BEDTIME AS NEEDED FOR INSOMNIA 90 tablet 4  . metoprolol tartrate (LOPRESSOR) 25 MG tablet Take 1 tablet (25 mg total) by mouth 2 (two) times daily. 60 tablet 3   No facility-administered medications prior to visit.      Allergies: Hydrochlorothiazide and Penicillins    PHYSICAL EXAM: VS:  BP 132/88 (BP Location: Left Arm, Patient Position: Sitting, Cuff Size: Normal)   Pulse 88   Ht 5\' 1"  (1.549 m)   Wt 203 lb (92.1 kg)   BMI 38.36 kg/m  , Body mass index is 38.36 kg/m. Wt Readings from Last 3 Encounters:  03/02/16 203 lb (92.1 kg)  02/23/16 214 lb (97.1 kg)  02/21/16 216 lb (98 kg)    GENERAL:  well developed, well nourished, obese, not in acute distress HEENT: normocephalic, pink conjunctivae, anicteric sclerae, no xanthelasma, normal dentition, oropharynx clear NECK:  no neck vein engorgement, JVP normal, no hepatojugular reflux, carotid upstroke brisk and symmetric, no bruit, no thyromegaly, no lymphadenopathy LUNGS:  good respiratory effort, clear to auscultation bilaterally CV:  PMI not displaced, no thrills, no lifts, S1 and S2 within normal limits, no palpable S3 or S4, systolic ejection murmur not so much noticeable today compared to last visit , no rubs, no gallops ABD:  Soft, nontender, nondistended, normoactive bowel sounds, no abdominal aortic bruit, no hepatomegaly, no splenomegaly MS: nontender back, no kyphosis, no scoliosis, no joint deformities EXT:  2+ DP/PT pulses, +1 edema, no varicosities, no cyanosis, no clubbing SKIN: warm, nondiaphoretic, normal turgor, no ulcers NEUROPSYCH: alert, oriented to person, place, and time, sensory/motor grossly intact, normal mood, appropriate affect    Recent Labs: 01/25/2016: TSH 1.360 02/09/2016: B Natriuretic Peptide 1,592.0 02/14/2016: ALT 34 02/18/2016: BUN 24; Creatinine, Ser 0.95; Potassium 4.1; Sodium 138 02/29/2016: Hemoglobin 10.6; Platelets 224   Lipid Panel    Component  Value Date/Time   CHOL 139 06/25/2015 0959   TRIG 64 06/25/2015 0959   HDL 59 06/25/2015 0959   CHOLHDL 2.4 06/25/2015 0959   LDLCALC 67 06/25/2015 0959     Other studies Reviewed:  EKG:  The ekg from 02/01/2016 was personally reviewed by me and it revealed sinus rhythm, 90 BPM PACs nonspecific ST-T wave changes. QT 360 ms, QTC 413 ms.  Additional studies/ records that were reviewed personally reviewed by me today include:  Echo 08/19/2015: Left ventricle: There was moderate concentric hypertrophy.   Systolic function was normal. The estimated ejection fraction was   in the range of 50% to 55%. - Aortic valve: There was moderate stenosis. Valve area (VTI): 1.39   cm^2. Valve area (Vmax): 1.11 cm^2. Valve area (Vmean): 1.02   cm^2. - Mitral valve: There  was moderate regurgitation. - Left atrium: The atrium was mildly dilated. - Right atrium: The atrium was mildly dilated. - Pericardium, extracardiac: A trivial pericardial effusion was   identified posterior to the heart.  Echo 02/25/2016: Left ventricle: The cavity size was normal. There was moderate   concentric hypertrophy. Systolic function was severely reduced.   The estimated ejection fraction was in the range of 25% to 30%.   Diffuse hypokinesis. Regional wall motion abnormalities cannot be   excluded. Features are consistent with a pseudonormal left   ventricular filling pattern, with concomitant abnormal relaxation   and increased filling pressure (grade 2 diastolic dysfunction). - Aortic valve: Transvalvular velocity was elevated.   Degree of aortic valve stenosis is likely underestimated   secondary to severely depressed EF. Valve area (VTI): 1.41 cm^2. - Mitral valve: Calcified annulus. There was mild regurgitation. - Left atrium: The atrium was mildly dilated. - Right ventricle: Systolic function was mildly to moderately   reduced. - Tricuspid valve: There was mild-moderate regurgitation. - Pulmonary arteries:  Systolic pressure was moderately elevated. PA   peak pressure: 55 mm Hg (S).   ASSESSMENT AND PLAN: Congestive heart failure, now systolic dysfunction, chronic Improvement in volume status. For now, continue Lasix at 40 mg daily. Continue medical therapy with aspirin 81 mg by mouth daily, Lisinopril 40, metoprolol increased to 50 twice a day. Patient does not want to try Coreg due to her history of asthma. Agree with Spironolactone 25mg  po qd.  PCP has ordered repeat blood work in about a week or so. Due to depressed ejection fraction, discussed importance of ruling out coronary artery disease. Recommend right and left heart catheterization. Risks and benefits of cardiac catheterization have been discussed with the patient.  These include less than 1% chance of major complications including but not limited to bleeding, infection, kidney damage, arrhyhthmia, heart attack, stroke, and death.  The patient understands these risks and is willing to proceed. We will need to check with hematology the prognosis of her monoclonal gammopathy, if she needs to go for surgical revascularization.  Continue daily weights and low sodium diet.  If weight gain of > 2 lbs over 24 hours, or > 5 lbs over 1 week, please call office.   Aortic stenosis She'll go for recommend left heart cath.  Hypertension BP is well controlled. Continue monitoring BP. Continue current medical therapy and lifestyle changes.  Current medicines are reviewed at length with the patient today.  The patient does not have concerns regarding medicines.  Labs/ tests ordered today include:  Orders Placed This Encounter  Procedures  . CBC with Differential/Platelet  . Basic metabolic panel  . INR/PT     Disposition:   FU with undersigned after tests  I spent at least 40 minutes with the patient today and more than 50% of the time was spent counseling the patient and coordinating care.    Signed, Wende Bushy, MD  03/02/2016 5:17  PM    [place]  This note was generated in part with voice recognition software and I apologize for any typographical errors that were not detected and corrected.

## 2016-03-07 NOTE — Interval H&P Note (Signed)
History and Physical Interval Note:  03/07/2016 9:01 AM  [name]  has presented today for cardiac catheterization, with the diagnosis of new systolic heart failure and moderate aortic stenosis. The various methods of treatment have been discussed with the patient and family. After consideration of risks, benefits and other options for treatment, the patient has consented to  Procedure(s): Right/Left Heart Cath and Coronary Angiography (N/A) as a surgical intervention .  The patient's history has been reviewed, patient examined, no change in status, stable for surgery.  I have reviewed the patient's chart and labs.  Questions were answered to the patient's satisfaction.    Cath Lab Visit (complete for each Cath Lab visit)  Clinical Evaluation Leading to the Procedure:   ACS: No.  Non-ACS:    Anginal Classification: CCS III  Anti-ischemic medical therapy: Minimal Therapy (1 class of medications)  Non-Invasive Test Results: High-risk stress test findings: cardiac mortality >3%/year (severely reduced LVEF by echo)  Prior CABG: No previous CABG   [name]

## 2016-03-08 ENCOUNTER — Encounter: Payer: Self-pay | Admitting: Internal Medicine

## 2016-03-08 ENCOUNTER — Telehealth: Payer: Self-pay | Admitting: Internal Medicine

## 2016-03-08 NOTE — Telephone Encounter (Signed)
Thank you very much 

## 2016-03-08 NOTE — Telephone Encounter (Signed)
I spoke with the patient's husband to follow-up regarding how she is doing after yesterday's catheterization. She had a restless night but feels well this morning other than generalized fatigue. She has a little bit soreness in her groin. Her husband denies any bleeding or swelling in that area. I advised him to monitor groin closely and to seek immediate medical attention if bleeding, swelling, or worsening pain develop. We also again reviewed medications, including addition of amlodipine 5 mg daily and increasing of furosemide to 40 mg twice a day. Patient will return on Friday (03/10/16) for a basic metabolic panel to reassess renal function and electrolytes.  Nelva Bush, MD [facility] Pager: [phone]

## 2016-03-08 NOTE — OR Nursing (Signed)
Dr. Saunders Revel in to see patient post cath and assess for discharge.  Discussed with dr. Saunders Revel administration of dilaudid, xanax, home po meds and foley insertion in managing patients hypertension, urinary frequency, potential risk for bleeding, severe pain and anxiety.  Physician verbalized, "OK"  Dr. Saunders Revel gave verbal order to discharge patient to home if she was more awake after sitting up if she was not more awake to page him and he would admit patient.

## 2016-03-08 NOTE — Telephone Encounter (Signed)
Patient contacted regarding discharge from [facility] on 03/07/16.  Patient understands to follow up with provider Dr. Yvone Neu  on 03/14/16 at 10:30AM at [facility]. Patient understands discharge instructions? Yes Patient understands medications and regiment? Yes Patient understands to bring all medications to this visit? Yes  Spoke with patients husband regarding repeat labs and follow up information. He verbalized understanding with no further questions at this time.

## 2016-03-08 NOTE — OR Nursing (Signed)
Pt. Sat up at 16:30, ate sandwich, cookies and chips without nausea or vomiting.  More awake, pain controlled, radial artery and rt fem artery site without signs of hematoma. Husband at bedside.  Patient discharged to home via private auto transported to auto via wheelchair from special procedures. Husband verbalized he had assistance at home getting patient into the house.  Patient's mobility is limited at baseline.

## 2016-03-09 ENCOUNTER — Telehealth: Payer: Self-pay | Admitting: Cardiology

## 2016-03-09 LAB — CHROMOSOME ANALYSIS, BONE MARROW

## 2016-03-09 NOTE — Telephone Encounter (Signed)
Pt spouse states pt was in the hospital and needs to discuss some medications . Metoprolol and Amlodopine. Please call.

## 2016-03-09 NOTE — Telephone Encounter (Signed)
Spoke with patients spouse per release form regarding medications and reviewed what doses she should be taking at this time. He verbalized understanding of our conversation, medications that she should be on, and he had no further questions at this time.

## 2016-03-10 ENCOUNTER — Other Ambulatory Visit
Admission: RE | Admit: 2016-03-10 | Discharge: 2016-03-10 | Disposition: A | Discharge status: Home / Self Care | Payer: Medicare Other | Source: Ambulatory Visit | Attending: Cardiology | Admitting: Cardiology

## 2016-03-10 ENCOUNTER — Telehealth: Payer: Self-pay | Admitting: *Deleted

## 2016-03-10 ENCOUNTER — Inpatient Hospital Stay: Payer: Medicare Other | Attending: Hematology and Oncology | Admitting: Hematology and Oncology

## 2016-03-10 VITALS — BP 169/83 | HR 70 | Temp 97.2°F | Resp 18 | Wt 206.4 lb

## 2016-03-10 DIAGNOSIS — Z803 Family history of malignant neoplasm of breast: Secondary | ICD-10-CM

## 2016-03-10 DIAGNOSIS — M545 Low back pain: Secondary | ICD-10-CM | POA: Diagnosis not present

## 2016-03-10 DIAGNOSIS — I5022 Chronic systolic (congestive) heart failure: Secondary | ICD-10-CM | POA: Insufficient documentation

## 2016-03-10 DIAGNOSIS — Z7984 Long term (current) use of oral hypoglycemic drugs: Secondary | ICD-10-CM

## 2016-03-10 DIAGNOSIS — E119 Type 2 diabetes mellitus without complications: Secondary | ICD-10-CM | POA: Diagnosis not present

## 2016-03-10 DIAGNOSIS — Z7982 Long term (current) use of aspirin: Secondary | ICD-10-CM

## 2016-03-10 DIAGNOSIS — I2721 Secondary pulmonary arterial hypertension: Secondary | ICD-10-CM

## 2016-03-10 DIAGNOSIS — Z808 Family history of malignant neoplasm of other organs or systems: Secondary | ICD-10-CM

## 2016-03-10 DIAGNOSIS — I2510 Atherosclerotic heart disease of native coronary artery without angina pectoris: Secondary | ICD-10-CM | POA: Diagnosis not present

## 2016-03-10 DIAGNOSIS — Z79899 Other long term (current) drug therapy: Secondary | ICD-10-CM | POA: Diagnosis not present

## 2016-03-10 DIAGNOSIS — I10 Essential (primary) hypertension: Secondary | ICD-10-CM | POA: Diagnosis not present

## 2016-03-10 DIAGNOSIS — D509 Iron deficiency anemia, unspecified: Secondary | ICD-10-CM | POA: Diagnosis not present

## 2016-03-10 DIAGNOSIS — D649 Anemia, unspecified: Secondary | ICD-10-CM

## 2016-03-10 DIAGNOSIS — C9000 Multiple myeloma not having achieved remission: Secondary | ICD-10-CM

## 2016-03-10 DIAGNOSIS — J45909 Unspecified asthma, uncomplicated: Secondary | ICD-10-CM

## 2016-03-10 LAB — BASIC METABOLIC PANEL
ANION GAP: 6 (ref 5–15)
BUN: 31 mg/dL — AB (ref 6–20)
CHLORIDE: 104 mmol/L (ref 101–111)
CO2: 27 mmol/L (ref 22–32)
Calcium: 8.8 mg/dL — ABNORMAL LOW (ref 8.9–10.3)
Creatinine, Ser: 0.97 mg/dL (ref 0.44–1.00)
GFR calc Af Amer: >60 mL/min (ref >60)
GFR calc non Af Amer: 57 mL/min — ABNORMAL LOW (ref >60)
GLUCOSE: 160 mg/dL — AB (ref 65–99)
POTASSIUM: 3.5 mmol/L (ref 3.5–5.1)
Sodium: 137 mmol/L (ref 135–145)

## 2016-03-10 MED ORDER — POTASSIUM CHLORIDE CRYS ER 20 MEQ PO TBCR: 40.0000 meq | EXTENDED_RELEASE_TABLET | Freq: Every day | ORAL | 0 refills | Status: DC

## 2016-03-10 NOTE — Telephone Encounter (Signed)
No answer. No voicemail. 

## 2016-03-10 NOTE — Telephone Encounter (Signed)
-----   Message from Nelva Bush, MD sent at 03/10/2016  3:14 PM EST ----- Please let Mrs. and Mr. Swendsen know that Mrs. Moravek's renal function is relatively stable (stable creatinine and slightly increased BUN). It is ok for her to start taking metformin again. Her potassium has dropped since last week, likely due to increased furosemide dose. Please have her start taking potassium chloride 40 meq daily. She should follow-up in the office as scheduled next week with Dr. Yvone Neu.

## 2016-03-10 NOTE — Progress Notes (Signed)
Patient here for bone marrow results.

## 2016-03-10 NOTE — Telephone Encounter (Signed)
No answer/No voicemail. Prescription sent in for potassium.

## 2016-03-10 NOTE — Progress Notes (Signed)
[facility] day:  03/10/2016  Chief Complaint: [name] is a 71 y.o. female with a monoclonal gammopathy who is seen for review of interval bone marrow aspirate and biopsy.  HPI: The patient was last seen in the medical oncology clinic on 02/21/2016.  At that time, work-up was reviewed.  SPEP revealed a 1.8 gm/dL monoclonal spike.  Free light chain ratio was 6.13 (0.26-1.65).  Beta2-microglobulin was 3.9 (0.6-2.4).  BMP revealed a creatinine of 0.95 and calcium 9.0.  24 hour urine for UPEP revealed 803 mg/dL protein in 24 hours with an M-spike of 31 mg/24 hours.  Immunofixation revealed an IgG monoclonal protein with kappa light chain specificity.  Bone survey was not performed.  Bone marrow on 02/29/2016 revealed a hypercellular marrow for age with plasmacytosis (plasma cells 10%).  There was trilineage hematopoiesis. Immunohistochemical stains highlighted the plasma cell component which generally showed a polyclonal staining pattern for kappa and lambda light chain although there was kappa light chain excess especially in several plasma cell clusters.  In the presence of a monoclonal protein with kappa light chain specificity, the features were most suggestive of early plasma cell dyscrasia/neoplasm.  She had a cardiac catheterization on 03/07/2016 by Dr. Harrell Gave End.  She was noted to have non-obstructive coronary artery disease.  She has severe pulmonary artery hypertension with moderate to severely elevated right heart filling pressures.  She had moderately elevated left heart filling pressures.  Recommendations were for aggressive medical management of non-ischemic cardiomyopathy, pulmonary hypertension, and systemic hypertension.  Risk factor modification and medical therapy of non-obstructive CAD was also recommended.  Symptomatically, she notes motion sickness after being in the car.  He back hurts.  She is sore at her cardiac cath  site.   Past Medical History:  Diagnosis Date  . Asthma   . Diabetes mellitus without complication ([place])   . History of measles   . Hypertension     Past Surgical History:  Procedure Laterality Date  . Cyst(solitary) of breast:removed    . RIGHT/LEFT HEART CATH AND CORONARY ANGIOGRAPHY N/A 03/07/2016   Procedure: Right/Left Heart Cath and Coronary Angiography;  Surgeon: Nelva Bush, MD;  Location: [facility];  Service: Cardiovascular;  Laterality: N/A;  . TUBAL LIGATION      Family History  Problem Relation Age of Onset  . Cancer Mother     Uterine cancer  . Diabetes Mother   . Heart disease Mother   . Heart failure Mother   . Parkinson's disease Father   . Cancer Sister     breast cancer  . Diabetes Brother     Non-insulin Dependent Diabetes Mellitus  . Mesothelioma Brother   . Hypertension Sister   . Diabetes Sister     Non-insulin dependent Diabetes Mellitus    Social History:  reports that she has never smoked. She has never used smokeless tobacco. She reports that she does not drink alcohol or use drugs.  She is originally from [place]  Her husband was born in [place], [place].  They met in middle school in [place].  She has been married to her husband x 52 years.  The patient is accompanied by her husband, [name], today.  Allergies:  Allergies  Allergen Reactions  . Hydrochlorothiazide     Leg Cramps  . Penicillins Rash    Current Medications: Current Outpatient Prescriptions  Medication Sig Dispense Refill  . ADVAIR HFA 115-21 MCG/ACT inhaler  Inhale 2 puffs into the lungs as needed.     Marland Kitchen albuterol (PROAIR HFA) 108 (90 Base) MCG/ACT inhaler Inhale 2 puffs into the lungs every 6 (six) hours as needed. 18 Inhaler 3  . amLODipine (NORVASC) 5 MG tablet Take 1 tablet (5 mg total) by mouth daily. 30 tablet 5  . aspirin EC 81 MG tablet Take 1 tablet (81 mg total) by mouth daily. 90 tablet 3  . fluticasone furoate-vilanterol (BREO  ELLIPTA) 200-25 MCG/INH AEPB Inhale 1 puff into the lungs daily. FOR ASTHMA J45.20 1 each 5  . furosemide (LASIX) 40 MG tablet Take 1 tablet (40 mg total) by mouth 2 (two) times daily. 60 tablet 0  . HYDROcodone-acetaminophen (NORCO) 7.5-325 MG tablet Take 1 tablet by mouth every 6 (six) hours as needed for moderate pain. 60 tablet 0  . levalbuterol (XOPENEX) 1.25 MG/3ML nebulizer solution Take 1.25 mg by nebulization every 8 (eight) hours as needed for wheezing. 72 mL 3  . lisinopril (PRINIVIL,ZESTRIL) 40 MG tablet Take 1 tablet (40 mg total) by mouth daily. 30 tablet 1  . lovastatin (MEVACOR) 20 MG tablet Take 1 tablet (20 mg total) by mouth at bedtime. 90 tablet 4  . metoprolol (LOPRESSOR) 50 MG tablet Take 1 tablet (50 mg total) by mouth 2 (two) times daily. 180 tablet 3  . spironolactone (ALDACTONE) 25 MG tablet Take 1 tablet (25 mg total) by mouth daily. 30 tablet 2  . doxycycline (VIBRAMYCIN) 100 MG capsule Take 1 capsule (100 mg total) by mouth 2 (two) times daily. 28 capsule 0  . KLOR-CON 10 10 MEQ tablet TAKE 2 TABLETS BY MOUTH TWICE A DAY FOR 3 DAYS THEN TAKE 2 TABLETS ONCE DAILY  0  . meloxicam (MOBIC) 15 MG tablet Take 1 tablet (15 mg total) by mouth daily as needed for pain. Take with food 90 tablet 4  . metFORMIN (GLUCOPHAGE-XR) 750 MG 24 hr tablet Take 2 tablets by mouth daily.  4  . mupirocin cream (BACTROBAN) 2 % Apply 1 application topically 2 (two) times daily. 30 g 1  . traZODone (DESYREL) 100 MG tablet TAKE 1 TABLET BY MOUTH EVERY NIGHT AT BEDTIME AS NEEDED FOR INSOMNIA 90 tablet 4   No current facility-administered medications for this visit.     Review of Systems:  GENERAL:  Feels "tired".  No fevers, sweats or weight loss. PERFORMANCE STATUS (ECOG):  1 HEENT:  Chronic vision changes.  No visual changes, runny nose, sore throat, mouth sores or tenderness. Lungs: Shortness of breath.  No cough.  No hemoptysis. Cardiac:  No chest pain, palpitations, orthopnea, or  PND. GI:  No nausea, vomiting, diarrhea, constipation, melena or hematochezia.  Colonoscopy up-to-date. GU:  No urgency, frequency, dysuria, or hematuria. Musculoskeletal:  No back pain.  No joint pain.  No muscle tenderness. Extremities:  Lower extremity swelling.  No pain. Skin:  Right leg weeping.  No other rashes or skin changes. Neuro:  No headache, numbness or weakness, balance or coordination issues. Endocrine:  Diabetes.  No thyroid issues, hot flashes or night sweats. Psych:  Anxiety.  No mood changes, depression. Pain:  No focal pain. Review of systems:  All other systems reviewed and found to be negative.  Physical Exam: Blood pressure (!) 169/83, pulse 70, temperature 97.2 F (36.2 C), temperature source Tympanic, resp. rate 18, weight 206 lb 6 oz (93.6 kg). GENERAL:  Well developed, well nourished, overweight woman somewhat uncomfortable in a wheelchair in the exam room in no acute distress.  She is restless. MENTAL STATUS:  Alert and oriented to person, place and time. HEAD:  Graying blonde/brown hair.  Normocephalic, atraumatic, face symmetric, no Cushingoid features. EYES:  Glasses.  Blue eyes.  No conjunctivitis or scleral icterus. EXTREMITIES:  Chronic lower extremity edema.  NEUROLOGICAL: Unremarkable. PSYCH:  Appropriate.   Hospital Outpatient Visit on 03/10/2016  Component Date Value Ref Range Status  . Sodium 03/10/2016 137  135 - 145 mmol/L Final  . Potassium 03/10/2016 3.5  3.5 - 5.1 mmol/L Final  . Chloride 03/10/2016 104  101 - 111 mmol/L Final  . CO2 03/10/2016 27  22 - 32 mmol/L Final  . Glucose, Bld 03/10/2016 160* 65 - 99 mg/dL Final  . BUN 03/10/2016 31* 6 - 20 mg/dL Final  . Creatinine, Ser 03/10/2016 0.97  0.44 - 1.00 mg/dL Final  . Calcium 03/10/2016 8.8* 8.9 - 10.3 mg/dL Final  . GFR calc non Af Amer 03/10/2016 57* >60 mL/min Final  . GFR calc Af Amer 03/10/2016 >60  >60 mL/min Final   Comment: (NOTE) The eGFR has been calculated using the CKD  EPI equation. This calculation has not been validated in all clinical situations. eGFR's persistently <60 mL/min signify possible Chronic Kidney Disease.   . Anion gap 03/10/2016 6  5 - 15 Final    Assessment:  CATHIE BONNELL is a 71 y.o. female with an IgG monoclonal gammopathy.  She appears to have stage II myeloma.  SPEP on 01/25/2016 revealed a 0.9 gm/dL monoclonal spike.  She denies any bone pain or recurrent infections.  Calcium and creatinine are normal.  Urinalysis revealed 30+ protein.  Work-up on 02/14/2016 revealed a hematocrit of 29.1, hemoglobin 9.4, MCV 84.6, platelets 225,000, white count 5700 with an [place] of 3700.  SPEP revealed a 1.9 g/dL monoclonal spike.  Kappa free light chains were 237.2, lambda free light chains 38.7 with a ratio of 6.13 (0.26-1.65).  Beta 2-microglobulin was 3.9 (0.6-2.4).  BMP revealed a creatinine of 0.95 and calcium 9.0.  24 hour UPEP revealed 31 mg/24 hours of an IgG monoclonal protein with kappa light chain specificity.  Bence Jones protein was positive, kappa type.  Bone marrow on 02/29/2016 revealed a hypercellular marrow for age with plasmacytosis (plasma cells 10%).  There was trilineage hematopoiesis. Immunohistochemical stains highlighted the plasma cell component which generally showed a polyclonal staining pattern for kappa and lambda light chain although there was kappa light chain excess especially in several plasma cell clusters. The features were most suggestive of early plasma cell dyscrasia/neoplasm.  SPEP has been followed: 0.9 gm/dL on 01/25/2016 and 1.9 on 02/14/2016.   She has a normocytic anemia.  Work-up on 01/25/2016 and 02/14/2016 revealed a normal ferritin (90), iron studies, folate, and thyroid function tests.  B12 was 312 (low normal) on 01/25/2016, but with a normal MMA on 02/14/2016 thus ruling out B12 deficiency.  Reticulocyte count is inappropiately low.  Last colonoscopy was 2 years ago.  Cardiac catheterization on  03/07/2016 revealed non-obstructive coronary artery disease.  She has severe pulmonary artery hypertension with moderate to severely elevated right heart filling pressures and moderately elevated left heart filling pressures.   Symptomatically, she has some nausea today.  Exam is stable.  Plan: 1.  Review bone marrow aspirate and biopsy.  Bone marrow reveals 10% plasma cells suggestive of early involvement with myeloma.  Discuss indications for treatment: lytic bone lesions, hypercalcemia, renal insufficiency, anemia (hemoglobin < 10), free light chain ratio > 100.  Only potential indication for treatment  at present is anemia.  Unclear if anemia is related to myeloma.  Discuss plan for bone survey to r/o lytic lesions.  Consider PET scan as a more sensitive study.  Discuss plan for close monitoring if patient begins surveillance. 2.  Bone survey on 03/14/2016. 3.  Follow-up cytogenetics on bone marrow. 4.  MD to call patient with result. 5.  RTC in 2 weeks for MD assessment and labs (CBC with diff, CMP, SPEP, LDH, ferritin, iron studies, sed rate).   Lequita Asal, MD  03/10/2016

## 2016-03-13 ENCOUNTER — Other Ambulatory Visit: Payer: Self-pay | Admitting: *Deleted

## 2016-03-13 DIAGNOSIS — E113413 Type 2 diabetes mellitus with severe nonproliferative diabetic retinopathy with macular edema, bilateral: Secondary | ICD-10-CM | POA: Diagnosis not present

## 2016-03-13 NOTE — Telephone Encounter (Signed)
No answer. Rang several times and no voicemail picked up.

## 2016-03-14 ENCOUNTER — Encounter: Payer: Self-pay | Admitting: Cardiology

## 2016-03-14 ENCOUNTER — Ambulatory Visit (INDEPENDENT_AMBULATORY_CARE_PROVIDER_SITE_OTHER): Payer: Medicare Other | Admitting: Cardiology

## 2016-03-14 ENCOUNTER — Telehealth: Payer: Self-pay | Admitting: Cardiology

## 2016-03-14 VITALS — BP 128/66 | HR 69 | Ht 61.0 in | Wt 195.2 lb

## 2016-03-14 DIAGNOSIS — T148XXA Other injury of unspecified body region, initial encounter: Secondary | ICD-10-CM | POA: Diagnosis not present

## 2016-03-14 DIAGNOSIS — I428 Other cardiomyopathies: Secondary | ICD-10-CM | POA: Diagnosis not present

## 2016-03-14 DIAGNOSIS — I350 Nonrheumatic aortic (valve) stenosis: Secondary | ICD-10-CM | POA: Diagnosis not present

## 2016-03-14 DIAGNOSIS — I10 Essential (primary) hypertension: Secondary | ICD-10-CM | POA: Diagnosis not present

## 2016-03-14 DIAGNOSIS — I5022 Chronic systolic (congestive) heart failure: Secondary | ICD-10-CM

## 2016-03-14 NOTE — Telephone Encounter (Signed)
Spoke with patients spouse per release form and instructed him to have her take 4 tablets of the Klorcon 10 meq once daily and then have repeat labs next week. He verbalized understanding of all instructions and will go to [facility] entrance next week to have her get lab work.

## 2016-03-14 NOTE — Telephone Encounter (Signed)
Patients spouse called back and states that they have Klorcon 10 meq available. Let her know that I would check with Dr. Yvone Neu on what dosage she should be taking and then I would give them a call back. He was appreciative for the time and had no further questions at this time.

## 2016-03-14 NOTE — Patient Instructions (Signed)
Medication Instructions:  Your physician has recommended you make the following change in your medication:  1. RESTART Metformin (Glucophage) 2. Call us with dosage of Potassium that you have at home.    Labwork: Your physician recommends that you return for lab work prior to your next appointment. Go to [facility] of the hospital and check in there to have labs done. Take lab slip with you so they will have it.    Follow-Up: Your physician recommends that you schedule a follow-up appointment in: 1 month with Dr. Yvone Neu.   It was a pleasure seeing you today here in the office. Please do not hesitate to give [place] a call back if you have any further questions. [place], BSN

## 2016-03-14 NOTE — Progress Notes (Signed)
Cardiology Office Note   Date:  03/14/2016   ID:  [name], DOB 07-11-1945, MRN [id number]  Referring Doctor:  Lelon Huh, MD   Cardiologist:   Wende Bushy, MD   Reason for consultation:  Chief Complaint  Patient presents with  . other     1 week follow up from heart cath 03/07/16. Pt states she was taken off metformin and wants to stop lasix. Reviewed meds with pt verbally.      History of Present Illness: [name] is a 71 y.o. female who presents for ffup after heart cath  Since her last visit, she had her right and left heart cath done at [facility]. Post discharge, our office tried to contact them at least 4 times to inform them about the results of her repeat BMP and that she could start metformin, and that she needed some potassium supplementation. They were informed about difficulty reaching them.  The husband will call our office later today to verify the dose of potassium that they have at home.  Since last visit, her weight has gone down some more. Her blood pressure is controlled today however they do not measure her blood pressure at home.  She continues to complain about having to PL lot but it was explained to her at great length that she does need the Lasix to get rid of excess fluid.  No CP. SOB at baseline. Edema improving. Bruising reported on R leg. No pain.    ROS:  Please see the history of present illness. Aside from mentioned under HPI, all other systems are reviewed and negative.    Past Medical History:  Diagnosis Date  . Asthma   . Diabetes mellitus without complication ([place])   . History of measles   . Hypertension     Past Surgical History:  Procedure Laterality Date  . Cyst(solitary) of breast:removed    . RIGHT/LEFT HEART CATH AND CORONARY ANGIOGRAPHY N/A 03/07/2016   Procedure: Right/Left Heart Cath and Coronary Angiography;  Surgeon: Nelva Bush, MD;  Location: [facility];  Service: Cardiovascular;  Laterality: N/A;    . TUBAL LIGATION       reports that she has never smoked. She has never used smokeless tobacco. She reports that she does not drink alcohol or use drugs.   family history includes Cancer in her mother and sister; Diabetes in her brother, mother, and sister; Heart disease in her mother; Heart failure in her mother; Hypertension in her sister; Mesothelioma in her brother; Parkinson's disease in her father.   Outpatient Medications Prior to Visit  Medication Sig Dispense Refill  . ADVAIR HFA 115-21 MCG/ACT inhaler Inhale 2 puffs into the lungs as needed.     Marland Kitchen albuterol (PROAIR HFA) 108 (90 Base) MCG/ACT inhaler Inhale 2 puffs into the lungs every 6 (six) hours as needed. 18 Inhaler 3  . amLODipine (NORVASC) 5 MG tablet Take 1 tablet (5 mg total) by mouth daily. 30 tablet 5  . aspirin EC 81 MG tablet Take 1 tablet (81 mg total) by mouth daily. 90 tablet 3  . doxycycline (VIBRA-TABS) 100 MG tablet Take 1 tablet (100 mg total) by mouth 2 (two) times daily. 20 tablet 0  . fluticasone furoate-vilanterol (BREO ELLIPTA) 200-25 MCG/INH AEPB Inhale 1 puff into the lungs daily. FOR ASTHMA J45.20 1 each 5  . furosemide (LASIX) 40 MG tablet Take 1 tablet (40 mg total) by mouth 2 (two) times daily. 60 tablet 0  .  HYDROcodone-acetaminophen (NORCO) 7.5-325 MG tablet Take 1 tablet by mouth every 6 (six) hours as needed for moderate pain. 60 tablet 0  . levalbuterol (XOPENEX) 1.25 MG/3ML nebulizer solution Take 1.25 mg by nebulization every 8 (eight) hours as needed for wheezing. 72 mL 3  . lisinopril (PRINIVIL,ZESTRIL) 40 MG tablet Take 1 tablet (40 mg total) by mouth daily. 30 tablet 1  . lovastatin (MEVACOR) 20 MG tablet Take 1 tablet (20 mg total) by mouth at bedtime. 90 tablet 4  . meloxicam (MOBIC) 15 MG tablet Take 1 tablet (15 mg total) by mouth daily as needed for pain. Take with food 90 tablet 4  . metoprolol (LOPRESSOR) 50 MG tablet Take 1 tablet (50 mg total) by mouth 2 (two) times daily. 180 tablet  3  . spironolactone (ALDACTONE) 25 MG tablet Take 1 tablet (25 mg total) by mouth daily. 30 tablet 2  . traZODone (DESYREL) 100 MG tablet TAKE 1 TABLET BY MOUTH EVERY NIGHT AT BEDTIME AS NEEDED FOR INSOMNIA 90 tablet 4  . montelukast (SINGULAIR) 10 MG tablet Take 1 tablet (10 mg total) by mouth daily. For chronic cough (Patient not taking: Reported on 03/14/2016) 90 tablet 4  . mupirocin ointment (BACTROBAN) 2 % Place 1 application into the nose 2 (two) times daily. (Patient not taking: Reported on 03/14/2016) 22 g 0  . potassium chloride SA (K-DUR,KLOR-CON) 20 MEQ tablet Take 2 tablets (40 mEq total) by mouth daily. (Patient not taking: Reported on 03/14/2016) 60 tablet 0   No facility-administered medications prior to visit.      Allergies: Hydrochlorothiazide and Penicillins    PHYSICAL EXAM: VS:  BP 128/66 (BP Location: Left Arm, Patient Position: Sitting, Cuff Size: Normal)   Pulse 69   Ht 5\' 1"  (1.549 m)   Wt 195 lb 4 oz (88.6 kg)   BMI 36.89 kg/m  , Body mass index is 36.89 kg/m. Wt Readings from Last 3 Encounters:  03/14/16 195 lb 4 oz (88.6 kg)  03/10/16 206 lb 6 oz (93.6 kg)  03/07/16 203 lb (92.1 kg)    GENERAL:  well developed, well nourished, obese, not in acute distress HEENT: normocephalic, pink conjunctivae, anicteric sclerae, no xanthelasma, normal dentition, oropharynx clear NECK:  no neck vein engorgement, JVP normal, no hepatojugular reflux, carotid upstroke brisk and symmetric, no bruit, no thyromegaly, no lymphadenopathy LUNGS:  good respiratory effort, clear to auscultation bilaterally CV:  PMI not displaced, no thrills, no lifts, S1 and S2 within normal limits, no palpable S3 or S4, no murmurs, no rubs, no gallops ABD:  Soft, nontender, nondistended, normoactive bowel sounds, no abdominal aortic bruit, no hepatomegaly, no splenomegaly MS: nontender back, no kyphosis, no scoliosis, no joint deformities EXT:  1+ DP/PT pulses, +1 edema, no varicosities, no cyanosis,  no clubbing SKIN: warm, nondiaphoretic, normal turgor, no ulcers. Bruising noted over RFA/groin area, covering anterior thigh above knee, no bruit noted NEUROPSYCH: alert, oriented to person, place, and time, sensory/motor grossly intact, normal mood, appropriate affect  Recent Labs: 01/25/2016: TSH 1.360 02/09/2016: B Natriuretic Peptide 1,592.0 02/14/2016: ALT 34 02/29/2016: Hemoglobin 10.6 03/02/2016: Platelets 244 03/10/2016: BUN 31; Creatinine, Ser 0.97; Potassium 3.5; Sodium 137   Lipid Panel    Component Value Date/Time   CHOL 139 06/25/2015 0959   TRIG 64 06/25/2015 0959   HDL 59 06/25/2015 0959   CHOLHDL 2.4 06/25/2015 0959   LDLCALC 67 06/25/2015 0959     Other studies Reviewed:  EKG:  The ekg from 02/01/2016 was personally reviewed by  me and it revealed sinus rhythm, 90 BPM PACs nonspecific ST-T wave changes. QT 360 ms, QTC 413 ms.  Additional studies/ records that were reviewed personally reviewed by me today include:  Echo 08/19/2015: Left ventricle: There was moderate concentric hypertrophy.   Systolic function was normal. The estimated ejection fraction was   in the range of 50% to 55%. - Aortic valve: There was moderate stenosis. Valve area (VTI): 1.39   cm^2. Valve area (Vmax): 1.11 cm^2. Valve area (Vmean): 1.02   cm^2. - Mitral valve: There was moderate regurgitation. - Left atrium: The atrium was mildly dilated. - Right atrium: The atrium was mildly dilated. - Pericardium, extracardiac: A trivial pericardial effusion was   identified posterior to the heart.  Echo 02/25/2016: Left ventricle: The cavity size was normal. There was moderate   concentric hypertrophy. Systolic function was severely reduced.   The estimated ejection fraction was in the range of 25% to 30%.   Diffuse hypokinesis. Regional wall motion abnormalities cannot be   excluded. Features are consistent with a pseudonormal left   ventricular filling pattern, with concomitant abnormal  relaxation   and increased filling pressure (grade 2 diastolic dysfunction). - Aortic valve: Transvalvular velocity was elevated.   Degree of aortic valve stenosis is likely underestimated   secondary to severely depressed EF. Valve area (VTI): 1.41 cm^2. - Mitral valve: Calcified annulus. There was mild regurgitation. - Left atrium: The atrium was mildly dilated. - Right ventricle: Systolic function was mildly to moderately   reduced. - Tricuspid valve: There was mild-moderate regurgitation. - Pulmonary arteries: Systolic pressure was moderately elevated. PA   peak pressure: 55 mm Hg (S).  RLHCath 03/07/2016: Conclusions: 1. Mild to moderate, non-obstructive coronary artery disease, as detailed below. 2. Severe pulmonary hypertension with moderately to severely elevated right heart filling pressure. 3. Moderately elevated left heart filling pressure. 4. Reduced Fick cardiac output/index. 5. No significant aortic valve stenosis. 6. Severe systemic hypertension.  Recommendations: 1. Continue medical therapy for non-ischemic cardiomyopathy. 2. Add amlodipine 5 mg daily and increase furosemide to 40 mg BID. 3. Return to clinic in 3 days for basic metabolic panel; do not restart metformin until results have been reviewed. 4. Risk factor modification and medical therapy to prevent progression of coronary artery disease.    ASSESSMENT AND PLAN: Congestive heart failure, now systolic dysfunction, chronic Most likely nonischemic cardiomyopathy, likely from uncontrolled hypertension Volume status improving. Weight down to 195. Continue medical therapy: Lasix now on 40 twice a day. Patient to verify potassium chloride dose at home. Patient to resume potassium chloride total of 40 mEq daily. Continue lisinopril 40. Continue metoprolol 50 twice a day. Continue spironolactone 25 daily. Check BMP in one month. Follow-up in our office in one month. proceed. Continue daily weights and low  sodium diet.  If weight gain of > 2 lbs over 24 hours, or > 5 lbs over 1 week, please call office.   Aortic stenosis Likely no severe AS per heart cath. Continue to evaluate with echocardiogram serially.  Hypertension BP is well controlled today. Continue monitoring BP. Encouraged to invest in a blood pressure monitor. Continue current medical therapy and lifestyle changes.  Nonobstructive CAD  Continue medical therapy. Aspirin 81 mg by mouth daily. LDL goal is less than 70. PCP following labs.  Right groin/leg hematoma No bruit appreciated over RFA Check CBC today. If significantly dropped, may consider CT abd to rule out retroperitoneal hematoma.  Current medicines are reviewed at length with the patient today.  The patient does not have concerns regarding medicines.  Labs/ tests ordered today include:  Orders Placed This Encounter  Procedures  . CBC with Differential/Platelet  . Basic metabolic panel  . EKG 12-Lead   Disposition:   FU with undersigned in 1 month  I spent at least 40 minutes with the patient today and more than 50% of the time was spent counseling the patient and coordinating care.    Signed, Wende Bushy, MD  03/14/2016 11:26 AM    [place]  This note was generated in part with voice recognition software and I apologize for any typographical errors that were not detected and corrected.

## 2016-03-14 NOTE — Telephone Encounter (Signed)
Patient here in office today to see Dr. Yvone Neu.

## 2016-03-14 NOTE — Telephone Encounter (Signed)
Total of 24meq qd. pls rpt bmp 1 week

## 2016-03-15 LAB — CBC WITH DIFFERENTIAL/PLATELET
BASOS: 0 %
Basophils Absolute: 0 10*3/uL (ref 0.0–0.2)
EOS (ABSOLUTE): 0.2 10*3/uL (ref 0.0–0.4)
Eos: 3 %
Hematocrit: 26.8 % — ABNORMAL LOW (ref 34.0–46.6)
Hemoglobin: 8.1 g/dL — ABNORMAL LOW (ref 11.1–15.9)
IMMATURE GRANULOCYTES: 0 %
Immature Grans (Abs): 0 10*3/uL (ref 0.0–0.1)
Lymphocytes Absolute: 2.4 10*3/uL (ref 0.7–3.1)
Lymphs: 28 %
MCH: 26.6 pg (ref 26.6–33.0)
MCHC: 30.2 g/dL — ABNORMAL LOW (ref 31.5–35.7)
MCV: 88 fL (ref 79–97)
MONOS ABS: 0.8 10*3/uL (ref 0.1–0.9)
Monocytes: 10 %
NEUTROS PCT: 59 %
Neutrophils Absolute: 4.9 10*3/uL (ref 1.4–7.0)
Platelets: 328 10*3/uL (ref 150–379)
RBC: 3.04 x10E6/uL — AB (ref 3.77–5.28)
RDW: 17.3 % — AB (ref 12.3–15.4)
WBC: 8.3 10*3/uL (ref 3.4–10.8)

## 2016-03-16 ENCOUNTER — Ambulatory Visit (INDEPENDENT_AMBULATORY_CARE_PROVIDER_SITE_OTHER): Payer: Medicare Other | Admitting: Family Medicine

## 2016-03-16 ENCOUNTER — Encounter: Payer: Self-pay | Admitting: Family Medicine

## 2016-03-16 ENCOUNTER — Telehealth: Payer: Self-pay | Admitting: *Deleted

## 2016-03-16 VITALS — BP 118/62 | HR 66 | Temp 97.7°F | Resp 16

## 2016-03-16 DIAGNOSIS — L723 Sebaceous cyst: Secondary | ICD-10-CM | POA: Diagnosis not present

## 2016-03-16 DIAGNOSIS — D472 Monoclonal gammopathy: Secondary | ICD-10-CM

## 2016-03-16 DIAGNOSIS — I10 Essential (primary) hypertension: Secondary | ICD-10-CM

## 2016-03-16 DIAGNOSIS — L03115 Cellulitis of right lower limb: Secondary | ICD-10-CM

## 2016-03-16 DIAGNOSIS — I5022 Chronic systolic (congestive) heart failure: Secondary | ICD-10-CM | POA: Diagnosis not present

## 2016-03-16 DIAGNOSIS — D649 Anemia, unspecified: Secondary | ICD-10-CM

## 2016-03-16 DIAGNOSIS — L089 Local infection of the skin and subcutaneous tissue, unspecified: Secondary | ICD-10-CM

## 2016-03-16 MED ORDER — MUPIROCIN CALCIUM 2 % EX CREA
1.0000 | TOPICAL_CREAM | Freq: Two times a day (BID) | CUTANEOUS | 1 refills | Status: DC
Start: 2016-03-16 — End: 2016-04-20

## 2016-03-16 MED ORDER — DOXYCYCLINE HYCLATE 100 MG PO CAPS: 100.0000 mg | ORAL_CAPSULE | Freq: Two times a day (BID) | ORAL | 0 refills | Status: AC

## 2016-03-16 NOTE — Progress Notes (Signed)
Patient: [name] Female    DOB: 31-May-1945   71 y.o.   MRN: [id number] Visit Date: 03/16/2016  Today's Provider: Lelon Huh, MD   Chief Complaint  Patient presents with  . Follow-up  . Cellulitis  . Hypertension  . Edema   Subjective:    HPI  Hypertension and Edema, follow-up:  BP Readings from Last 3 Encounters:  03/14/16 128/66  03/10/16 (!) 169/83  03/07/16 (!) 161/97    She was last seen for hypertension 3 weeks ago.  BP at that visit was 166/92 . Management during that visit includes adding Spironolactone 31m daily. Patient was also  advised to stop potassium supplements. Since last office visit, patient was seen by her Cardiologist for a cardiac catheterization. Patient states her cardiologist advised her to restart Potassium supplements.  She reports good compliance with treatment. She is not having side effects.  She is not exercising. She is adherent to low salt diet.   Outside blood pressures are not being checked. She is experiencing lower extremity edema.  Patient denies chest pain, chest pressure/discomfort, claudication, dyspnea, exertional chest pressure/discomfort, fatigue, near-syncope and tachypnea.   Cardiovascular risk factors include advanced age (older than 523for men, 652for women), hypertension, obesity (BMI >= 30 kg/m2) and sedentary lifestyle.  Use of agents associated with hypertension: NSAIDS.      Weight trend: fluctuating a lot Wt Readings from Last 3 Encounters:  03/14/16 195 lb 4 oz (88.6 kg)  03/10/16 206 lb 6 oz (93.6 kg)  03/07/16 203 lb (92.1 kg)    Current diet: well balanced   Continues follow up with Dr. IFarrel Connersfor CHF. Had cardiac cath 3/6 with findings of mild to moderate non-obstructing CAD. Has had furosemide doubled to 447mBID and has started back on potassium supplements, which were d/c when she started spironolactone.   She is being followed by Dr. CoMike Gipor monoclonal gammopathy, having had bone  marrow  biopsy  03/01/2015 revealing high number of plasma cells, thought to be suggestive of early plasma cell dyscrasia. Also found to have positive Bence Johns protein in 24 hour urine, which may be suggestive of multiple myeloma or Waldenstrm's macroglobulinemia.  ------------------------------------------------------------------------ Follow up of Cellulitis of Right lower extremity: Patient was last seen for this problem on 02/23/2016. Skin culture was obtained showing a mix of several bacteria. Doxycycline was refilled and patient was advised to continue taking. Today patient come in reporting good compliance with treatment, good tolerance and good symptom control. Patient took the last pill yesterday.  Redness has improved, but has not resolved. Still small amount of yellow drainage.     Allergies  Allergen Reactions  . Hydrochlorothiazide     Leg Cramps  . Penicillins Rash     Current Outpatient Prescriptions:  .  ADVAIR HFA 115-21 MCG/ACT inhaler, Inhale 2 puffs into the lungs as needed. , Disp: , Rfl:  .  albuterol (PROAIR HFA) 108 (90 Base) MCG/ACT inhaler, Inhale 2 puffs into the lungs every 6 (six) hours as needed., Disp: 18 Inhaler, Rfl: 3 .  amLODipine (NORVASC) 5 MG tablet, Take 1 tablet (5 mg total) by mouth daily., Disp: 30 tablet, Rfl: 5 .  aspirin EC 81 MG tablet, Take 1 tablet (81 mg total) by mouth daily., Disp: 90 tablet, Rfl: 3 .  fluticasone furoate-vilanterol (BREO ELLIPTA) 200-25 MCG/INH AEPB, Inhale 1 puff into the lungs daily. FOR ASTHMA J45.20, Disp: 1 each, Rfl: 5 .  furosemide (LASIX) 40  MG tablet, Take 1 tablet (40 mg total) by mouth 2 (two) times daily., Disp: 60 tablet, Rfl: 0 .  HYDROcodone-acetaminophen (NORCO) 7.5-325 MG tablet, Take 1 tablet by mouth every 6 (six) hours as needed for moderate pain., Disp: 60 tablet, Rfl: 0 .  levalbuterol (XOPENEX) 1.25 MG/3ML nebulizer solution, Take 1.25 mg by nebulization every 8 (eight) hours as needed for wheezing.,  Disp: 72 mL, Rfl: 3 .  lisinopril (PRINIVIL,ZESTRIL) 40 MG tablet, Take 1 tablet (40 mg total) by mouth daily., Disp: 30 tablet, Rfl: 1 .  lovastatin (MEVACOR) 20 MG tablet, Take 1 tablet (20 mg total) by mouth at bedtime., Disp: 90 tablet, Rfl: 4 .  meloxicam (MOBIC) 15 MG tablet, Take 1 tablet (15 mg total) by mouth daily as needed for pain. Take with food, Disp: 90 tablet, Rfl: 4 .  metoprolol (LOPRESSOR) 50 MG tablet, Take 1 tablet (50 mg total) by mouth 2 (two) times daily., Disp: 180 tablet, Rfl: 3 .  spironolactone (ALDACTONE) 25 MG tablet, Take 1 tablet (25 mg total) by mouth daily., Disp: 30 tablet, Rfl: 2 .  traZODone (DESYREL) 100 MG tablet, TAKE 1 TABLET BY MOUTH EVERY NIGHT AT BEDTIME AS NEEDED FOR INSOMNIA, Disp: 90 tablet, Rfl: 4 .  KLOR-CON 10 10 MEQ tablet, TAKE 2 TABLETS BY MOUTH TWICE A DAY FOR 3 DAYS THEN TAKE 2 TABLETS ONCE DAILY, Disp: , Rfl: 0 .  metFORMIN (GLUCOPHAGE-XR) 750 MG 24 hr tablet, Take 2 tablets by mouth daily., Disp: , Rfl: 4  Review of Systems  Constitutional: Positive for fatigue. Negative for appetite change, chills and fever.  Respiratory: Negative for chest tightness and shortness of breath.   Cardiovascular: Positive for leg swelling. Negative for chest pain and palpitations.  Gastrointestinal: Negative for abdominal pain, nausea and vomiting.  Neurological: Negative for dizziness and weakness.  Hematological: Bruises/bleeds easily.    Social History  Substance Use Topics  . Smoking status: Never Smoker  . Smokeless tobacco: Never Used  . Alcohol use No     Comment: Occasional alcohol use   Objective:   BP 118/62 (BP Location: Right Arm)   Pulse 66   Temp 97.7 F (36.5 C) (Oral)   Resp 16   SpO2 97% Comment: room air Vitals:   03/16/16 1128 03/16/16 1136  BP: 124/64 118/62  Pulse: 66   Resp: 16   Temp: 97.7 F (36.5 C)   TempSrc: Oral   SpO2: 97%      Physical Exam  General Appearance:    Alert, cooperative, no distress  Eyes:     PERRL, conjunctiva/corneas clear, EOM's intact       Lungs:     Clear to auscultation bilaterally, respirations unlabored  Heart:    II/VI systolic murmur  Neurologic:   Awake, alert, oriented x 3. No apparent focal neurological           defect.   Ext:    About 2cm dried erosion right anterior leg with no discharge and faint underlying erythema. Significantly improved from last visit.       Assessment & Plan:     1. Cellulitis of right lower extremity Improving, but not resolved.   2. Infected sebaceous cyst  - doxycycline (VIBRAMYCIN) 100 MG capsule; Take 1 capsule (100 mg total) by mouth 2 (two) times daily.  Dispense: 28 capsule; Refill: 0  3. Essential (primary) hypertension Stable on current medications regiment. She is very unhappy with frequency of urination and very fatigued on current medication  regiment. She does have upcoming follow up with cardiology to discuss further. Reassess at follow up in April   4. Chronic systolic heart failure ([place])   5. Monoclonal gammopathy Continue f/u hematology. Anticipate bone survey.        Lelon Huh, MD  [facility]

## 2016-03-16 NOTE — Telephone Encounter (Signed)
-----   Message from Wende Bushy, MD sent at 03/16/2016  9:57 AM EDT ----- Rec plain CT abdomen to rule out retroperitoneal hemorrhage. pls also forward to PCP.

## 2016-03-16 NOTE — Telephone Encounter (Signed)
Spoke with patients spouse and he was agreeable to getting the CT of abdomen scheduled. Let him know that I would call him in the morning to get this scheduled. He verbalized understanding with no further questions at this time.

## 2016-03-17 ENCOUNTER — Ambulatory Visit
Admission: RE | Admit: 2016-03-17 | Discharge: 2016-03-17 | Disposition: A | Discharge status: Home / Self Care | Payer: Medicare Other | Source: Ambulatory Visit | Attending: Cardiology | Admitting: Cardiology

## 2016-03-17 ENCOUNTER — Other Ambulatory Visit: Payer: Self-pay | Admitting: *Deleted

## 2016-03-17 DIAGNOSIS — X58XXXA Exposure to other specified factors, initial encounter: Secondary | ICD-10-CM | POA: Diagnosis not present

## 2016-03-17 DIAGNOSIS — S7011XA Contusion of right thigh, initial encounter: Secondary | ICD-10-CM | POA: Diagnosis not present

## 2016-03-17 DIAGNOSIS — D649 Anemia, unspecified: Secondary | ICD-10-CM | POA: Diagnosis not present

## 2016-03-17 DIAGNOSIS — K449 Diaphragmatic hernia without obstruction or gangrene: Secondary | ICD-10-CM | POA: Diagnosis not present

## 2016-03-17 DIAGNOSIS — I700 Atherosclerosis of aorta: Secondary | ICD-10-CM | POA: Diagnosis not present

## 2016-03-17 DIAGNOSIS — I2510 Atherosclerotic heart disease of native coronary artery without angina pectoris: Secondary | ICD-10-CM | POA: Insufficient documentation

## 2016-03-17 DIAGNOSIS — K5730 Diverticulosis of large intestine without perforation or abscess without bleeding: Secondary | ICD-10-CM | POA: Diagnosis not present

## 2016-03-17 NOTE — Telephone Encounter (Signed)
Spoke with scheduling and they will call and schedule this with the patient.

## 2016-03-17 NOTE — Telephone Encounter (Signed)
Results received from radiology and Dr. Yvone Neu notified.

## 2016-03-18 DIAGNOSIS — L03115 Cellulitis of right lower limb: Secondary | ICD-10-CM | POA: Insufficient documentation

## 2016-03-19 ENCOUNTER — Encounter: Payer: Self-pay | Admitting: Hematology and Oncology

## 2016-03-20 ENCOUNTER — Encounter (HOSPITAL_COMMUNITY): Payer: Self-pay

## 2016-03-20 ENCOUNTER — Encounter: Payer: Self-pay | Admitting: Family Medicine

## 2016-03-20 DIAGNOSIS — I700 Atherosclerosis of aorta: Secondary | ICD-10-CM | POA: Insufficient documentation

## 2016-03-20 DIAGNOSIS — K5790 Diverticulosis of intestine, part unspecified, without perforation or abscess without bleeding: Secondary | ICD-10-CM | POA: Insufficient documentation

## 2016-03-22 ENCOUNTER — Ambulatory Visit
Admission: RE | Admit: 2016-03-22 | Discharge: 2016-03-22 | Disposition: A | Discharge status: Home / Self Care | Payer: Medicare Other | Source: Ambulatory Visit | Attending: Hematology and Oncology | Admitting: Hematology and Oncology

## 2016-03-22 ENCOUNTER — Telehealth: Payer: Self-pay | Admitting: Cardiology

## 2016-03-22 ENCOUNTER — Other Ambulatory Visit
Admission: RE | Admit: 2016-03-22 | Discharge: 2016-03-22 | Disposition: A | Discharge status: Home / Self Care | Payer: Medicare Other | Source: Ambulatory Visit | Attending: Cardiology | Admitting: Cardiology

## 2016-03-22 DIAGNOSIS — I5022 Chronic systolic (congestive) heart failure: Secondary | ICD-10-CM

## 2016-03-22 DIAGNOSIS — D472 Monoclonal gammopathy: Secondary | ICD-10-CM | POA: Insufficient documentation

## 2016-03-22 LAB — BASIC METABOLIC PANEL
Anion gap: 4 — ABNORMAL LOW (ref 5–15)
BUN: 45 mg/dL — AB (ref 6–20)
CO2: 28 mmol/L (ref 22–32)
CREATININE: 1.24 mg/dL — AB (ref 0.44–1.00)
Calcium: 9.8 mg/dL (ref 8.9–10.3)
Chloride: 104 mmol/L (ref 101–111)
GFR calc Af Amer: 49 mL/min — ABNORMAL LOW (ref >60)
GFR, EST NON AFRICAN AMERICAN: 43 mL/min — AB (ref >60)
Glucose, Bld: 131 mg/dL — ABNORMAL HIGH (ref 65–99)
Potassium: 4.9 mmol/L (ref 3.5–5.1)
SODIUM: 136 mmol/L (ref 135–145)

## 2016-03-22 MED ORDER — POTASSIUM CHLORIDE ER 10 MEQ PO TBCR: 20.0000 meq | EXTENDED_RELEASE_TABLET | Freq: Every day | ORAL | 1 refills | Status: DC

## 2016-03-22 MED ORDER — FUROSEMIDE 20 MG PO TABS: 40.0000 mg | ORAL_TABLET | Freq: Every day | ORAL | 1 refills | Status: DC

## 2016-03-22 NOTE — Telephone Encounter (Signed)
-----   Message from Wende Bushy, MD sent at 03/22/2016 12:23 PM EDT ----- Rec decrease Lasix to 40mg  qd, KCl 21meq qd. Rpt bmp in 2 weeks. Monitor weight pls, call if > 2lbs inc/24 hrs or > 5lbs/1 week

## 2016-03-22 NOTE — Telephone Encounter (Signed)
Spoke with patients spouse per release form and reviewed results and recommendations. Reviewed instructions for monitoring weight and when to give [place] a call. He verbalized understanding, agreement with plan, and had no further questions at this time. Entered prescriptions and sent to pharmacy for pick up.

## 2016-03-22 NOTE — Telephone Encounter (Signed)
Pt spouse states pt has 2 more potassium pills left. He asks if pt should still continue to take this. Please call and advise.

## 2016-03-22 NOTE — Telephone Encounter (Signed)
Spoke with patients spouse and they are on the way to have some repeat labs done but he wanted [place] to know that she is down to 2 potassium pills. Let him know that once we received those results I would give him a call with further instructions and send in refill if needed. He verbalized understanding, agreement with plan, and had no further questions at this time.

## 2016-03-24 ENCOUNTER — Inpatient Hospital Stay (HOSPITAL_BASED_OUTPATIENT_CLINIC_OR_DEPARTMENT_OTHER): Payer: Medicare Other | Admitting: Hematology and Oncology

## 2016-03-24 ENCOUNTER — Other Ambulatory Visit: Payer: Medicare Other

## 2016-03-24 ENCOUNTER — Ambulatory Visit: Payer: Medicare Other | Admitting: Hematology and Oncology

## 2016-03-24 ENCOUNTER — Inpatient Hospital Stay: Payer: Medicare Other

## 2016-03-24 VITALS — BP 119/59 | HR 61 | Temp 94.5°F | Resp 18 | Wt 189.2 lb

## 2016-03-24 DIAGNOSIS — D509 Iron deficiency anemia, unspecified: Secondary | ICD-10-CM

## 2016-03-24 DIAGNOSIS — E119 Type 2 diabetes mellitus without complications: Secondary | ICD-10-CM

## 2016-03-24 DIAGNOSIS — Z808 Family history of malignant neoplasm of other organs or systems: Secondary | ICD-10-CM | POA: Diagnosis not present

## 2016-03-24 DIAGNOSIS — J45909 Unspecified asthma, uncomplicated: Secondary | ICD-10-CM

## 2016-03-24 DIAGNOSIS — I2510 Atherosclerotic heart disease of native coronary artery without angina pectoris: Secondary | ICD-10-CM | POA: Diagnosis not present

## 2016-03-24 DIAGNOSIS — D649 Anemia, unspecified: Secondary | ICD-10-CM

## 2016-03-24 DIAGNOSIS — C9000 Multiple myeloma not having achieved remission: Secondary | ICD-10-CM

## 2016-03-24 DIAGNOSIS — Z803 Family history of malignant neoplasm of breast: Secondary | ICD-10-CM

## 2016-03-24 DIAGNOSIS — I2721 Secondary pulmonary arterial hypertension: Secondary | ICD-10-CM | POA: Diagnosis not present

## 2016-03-24 DIAGNOSIS — Z7982 Long term (current) use of aspirin: Secondary | ICD-10-CM

## 2016-03-24 DIAGNOSIS — Z7984 Long term (current) use of oral hypoglycemic drugs: Secondary | ICD-10-CM

## 2016-03-24 DIAGNOSIS — I10 Essential (primary) hypertension: Secondary | ICD-10-CM | POA: Diagnosis not present

## 2016-03-24 DIAGNOSIS — Z79899 Other long term (current) drug therapy: Secondary | ICD-10-CM

## 2016-03-24 DIAGNOSIS — M545 Low back pain, unspecified: Secondary | ICD-10-CM

## 2016-03-24 DIAGNOSIS — Z7189 Other specified counseling: Secondary | ICD-10-CM

## 2016-03-24 LAB — COMPREHENSIVE METABOLIC PANEL
ALT: 13 U/L — ABNORMAL LOW (ref 14–54)
AST: 21 U/L (ref 15–41)
Albumin: 2.9 g/dL — ABNORMAL LOW (ref 3.5–5.0)
Alkaline Phosphatase: 72 U/L (ref 38–126)
Anion gap: 5 (ref 5–15)
BUN: 54 mg/dL — ABNORMAL HIGH (ref 6–20)
CO2: 25 mmol/L (ref 22–32)
Calcium: 9.4 mg/dL (ref 8.9–10.3)
Chloride: 103 mmol/L (ref 101–111)
Creatinine, Ser: 1.46 mg/dL — ABNORMAL HIGH (ref 0.44–1.00)
GFR calc Af Amer: 41 mL/min — ABNORMAL LOW (ref >60)
GFR calc non Af Amer: 35 mL/min — ABNORMAL LOW (ref >60)
Glucose, Bld: 121 mg/dL — ABNORMAL HIGH (ref 65–99)
Potassium: 4.6 mmol/L (ref 3.5–5.1)
Sodium: 133 mmol/L — ABNORMAL LOW (ref 135–145)
Total Bilirubin: 0.5 mg/dL (ref 0.3–1.2)
Total Protein: 8.8 g/dL — ABNORMAL HIGH (ref 6.5–8.1)

## 2016-03-24 LAB — CBC WITH DIFFERENTIAL/PLATELET
Basophils Absolute: 0.1 10*3/uL (ref 0–0.1)
Basophils Relative: 1 %
Eosinophils Absolute: 0.2 10*3/uL (ref 0–0.7)
Eosinophils Relative: 4 %
HCT: 25.5 % — ABNORMAL LOW (ref 35.0–47.0)
Hemoglobin: 8.6 g/dL — ABNORMAL LOW (ref 12.0–16.0)
Lymphocytes Relative: 29 %
Lymphs Abs: 1.7 10*3/uL (ref 1.0–3.6)
MCH: 28.6 pg (ref 26.0–34.0)
MCHC: 33.7 g/dL (ref 32.0–36.0)
MCV: 84.9 fL (ref 80.0–100.0)
Monocytes Absolute: 0.5 10*3/uL (ref 0.2–0.9)
Monocytes Relative: 9 %
Neutro Abs: 3.3 10*3/uL (ref 1.4–6.5)
Neutrophils Relative %: 57 %
Platelets: 332 10*3/uL (ref 150–440)
RBC: 3.01 MIL/uL — ABNORMAL LOW (ref 3.80–5.20)
RDW: 19.3 % — ABNORMAL HIGH (ref 11.5–14.5)
WBC: 5.7 10*3/uL (ref 3.6–11.0)

## 2016-03-24 LAB — IRON AND TIBC
Iron: 59 ug/dL (ref 28–170)
Saturation Ratios: 18 % (ref 10.4–31.8)
TIBC: 330 ug/dL (ref 250–450)
UIBC: 271 ug/dL

## 2016-03-24 LAB — SEDIMENTATION RATE: Sed Rate: 104 mm/hr — ABNORMAL HIGH (ref 0–30)

## 2016-03-24 LAB — FERRITIN: Ferritin: 108 ng/mL (ref 11–307)

## 2016-03-24 LAB — LACTATE DEHYDROGENASE: LDH: 196 U/L — ABNORMAL HIGH (ref 98–192)

## 2016-03-24 NOTE — Progress Notes (Signed)
[facility] day:  03/24/2016   Chief Complaint: [name] is a 71 y.o. female with a monoclonal gammopathy who is seen for 2 week assessment.  HPI: The patient was last seen in the medical oncology clinic on 03/10/2016.  At that time, recent bone marrow aspirate and biopsy were reviewed.  Bone marrow revealed 10% plasma cells.  We discussed indications for treatment: lytic bone lesions, hypercalcemia, renal insufficiency, anemia (hemoglobin < 10), free light chain ratio > 100.  The only potential indication for treatment was her anemia.  We discussed the plan for bone survey and possible PET scan.  Bone survey on 03/22/2016 revealed no focal lesion or acute bony abnormality.  Symptomatically, she feels lousy.  She notes pain in her lower back and in her left inguinal area.  She feels pain is riding up her back.  Legs and feet hurt.  She feels her "whole body is shot".  She has a hematoma in her right groin s/p heart catheterization.  She is napping throughout the day.  She is up every 15 minutes to urinate secondary to Lasix.   Past Medical History:  Diagnosis Date  . Asthma   . Diabetes mellitus without complication ([place])   . History of measles   . Hypertension     Past Surgical History:  Procedure Laterality Date  . Cyst(solitary) of breast:removed    . RIGHT/LEFT HEART CATH AND CORONARY ANGIOGRAPHY N/A 03/07/2016   Procedure: Right/Left Heart Cath and Coronary Angiography;  Surgeon: Nelva Bush, MD;  Location: [facility];  Service: Cardiovascular;  Laterality: N/A;  . TUBAL LIGATION      Family History  Problem Relation Age of Onset  . Cancer Mother     Uterine cancer  . Diabetes Mother   . Heart disease Mother   . Heart failure Mother   . Parkinson's disease Father   . Cancer Sister     breast cancer  . Diabetes Brother     Non-insulin Dependent Diabetes Mellitus  . Mesothelioma Brother   . Hypertension Sister    . Diabetes Sister     Non-insulin dependent Diabetes Mellitus    Social History:  reports that she has never smoked. She has never used smokeless tobacco. She reports that she does not drink alcohol or use drugs.  She is originally from [place] ([place]  Her husband was born in [place], [place].  They met in middle school in [place].  She has been married to her husband x 52 years.  The patient is accompanied by her husband, [name], today.  Allergies:  Allergies  Allergen Reactions  . Hydrochlorothiazide     Leg Cramps  . Penicillins Rash    Current Medications: Current Outpatient Prescriptions  Medication Sig Dispense Refill  . ADVAIR HFA 115-21 MCG/ACT inhaler Inhale 2 puffs into the lungs as needed.     Marland Kitchen albuterol (PROAIR HFA) 108 (90 Base) MCG/ACT inhaler Inhale 2 puffs into the lungs every 6 (six) hours as needed. 18 Inhaler 3  . amLODipine (NORVASC) 5 MG tablet Take 1 tablet (5 mg total) by mouth daily. 30 tablet 5  . aspirin EC 81 MG tablet Take 1 tablet (81 mg total) by mouth daily. 90 tablet 3  . doxycycline (VIBRAMYCIN) 100 MG capsule Take 1 capsule (100 mg total) by mouth 2 (two) times daily. 28 capsule 0  . fluticasone furoate-vilanterol (BREO ELLIPTA) 200-25 MCG/INH AEPB Inhale 1 puff  into the lungs daily. FOR ASTHMA J45.20 1 each 5  . furosemide (LASIX) 20 MG tablet Take 2 tablets (40 mg total) by mouth daily. 60 tablet 1  . HYDROcodone-acetaminophen (NORCO) 7.5-325 MG tablet Take 1 tablet by mouth every 6 (six) hours as needed for moderate pain. 60 tablet 0  . levalbuterol (XOPENEX) 1.25 MG/3ML nebulizer solution Take 1.25 mg by nebulization every 8 (eight) hours as needed for wheezing. 72 mL 3  . lisinopril (PRINIVIL,ZESTRIL) 40 MG tablet Take 1 tablet (40 mg total) by mouth daily. 30 tablet 1  . lovastatin (MEVACOR) 20 MG tablet Take 1 tablet (20 mg total) by mouth at bedtime. 90 tablet 4  . meloxicam (MOBIC) 15 MG tablet Take 1 tablet (15 mg total) by mouth  daily as needed for pain. Take with food 90 tablet 4  . metFORMIN (GLUCOPHAGE-XR) 750 MG 24 hr tablet Take 2 tablets by mouth daily.  4  . metoprolol (LOPRESSOR) 50 MG tablet Take 1 tablet (50 mg total) by mouth 2 (two) times daily. 180 tablet 3  . mupirocin cream (BACTROBAN) 2 % Apply 1 application topically 2 (two) times daily. 30 g 1  . potassium chloride (K-DUR) 10 MEQ tablet Take 2 tablets (20 mEq total) by mouth daily. 60 tablet 1  . spironolactone (ALDACTONE) 25 MG tablet Take 1 tablet (25 mg total) by mouth daily. 30 tablet 2  . traZODone (DESYREL) 100 MG tablet TAKE 1 TABLET BY MOUTH EVERY NIGHT AT BEDTIME AS NEEDED FOR INSOMNIA 90 tablet 4   No current facility-administered medications for this visit.     Review of Systems:  GENERAL:  Feels "lousy".  No fevers or sweats.  Weight loss of 17 pounds (? fluid). PERFORMANCE STATUS (ECOG):  1 HEENT:  Chronic vision changes.  No visual changes, runny nose, sore throat, mouth sores or tenderness. Lungs:  Shortness of breath.  No cough.  No hemoptysis. Cardiac:  No chest pain, palpitations, orthopnea, or PND. GI:  No nausea, vomiting, diarrhea, constipation, melena or hematochezia.  Colonoscopy up-to-date. GU:  Frequent urination with Lasix.  No urgency, dysuria, or hematuria. Musculoskeletal:  Lower back pain.  Pain "riding up neck".  No joint pain.  No muscle tenderness. Extremities:  Lower extremity swelling, improved.  Legs and feet hurt.   Skin:  Right leg weeping.  No other rashes or skin changes. Neuro:  No headache, numbness or weakness, balance or coordination issues. Endocrine:  Diabetes.  No thyroid issues, hot flashes or night sweats. Psych:  Anxiety.  No mood changes, depression.  Poor sleep. Pain:  Lower back pain.  Pain riding up neck. Review of systems:  All other systems reviewed and found to be negative.  Physical Exam: Blood pressure (!) 119/59, pulse 61, temperature (!) 94.5 F (34.7 C), temperature source Tympanic,  resp. rate 18, weight 189 lb 4 oz (85.8 kg). GENERAL:  Fatigued appearing woman sitting in a wheelchair in the exam room in no acute distress.  MENTAL STATUS:  Alert and oriented to person, place and time. HEAD:  Graying blonde/brown hair.  Normocephalic, atraumatic, face symmetric, no Cushingoid features. EYES:  Glasses.  Blue eyes.  Pupils equal round and reactive to light and accomodation.  No conjunctivitis or scleral icterus. ENT:  Oropharynx clear without lesion.  Tongue normal. Mucous membranes moist.  RESPIRATORY:  Clear to auscultation without rales, wheezes or rhonchi. CARDIOVASCULAR:  Regular rate and rhythm without murmur, rub or gallop. ABDOMEN:  Soft, non-tender, with active bowel sounds, and no  hepatosplenomegaly.  No masses. GROIN:  Right groin hematoma. SKIN:  No rashes, ulcers or lesions. EXTREMITIES:  Chronic lower extremity edema, improved.  No skin discoloration or tenderness.  No palpable cords. LYMPH NODES: No palpable cervical, supraclavicular, axillary or inguinal adenopathy  NEUROLOGICAL: Unremarkable. PSYCH:  Appropriate   Appointment on 03/24/2016  Component Date Value Ref Range Status  . LDH 03/24/2016 196* 98 - 192 U/L Final  . Sed Rate 03/24/2016 104* 0 - 30 mm/hr Final  . Sodium 03/24/2016 133* 135 - 145 mmol/L Final  . Potassium 03/24/2016 4.6  3.5 - 5.1 mmol/L Final  . Chloride 03/24/2016 103  101 - 111 mmol/L Final  . CO2 03/24/2016 25  22 - 32 mmol/L Final  . Glucose, Bld 03/24/2016 121* 65 - 99 mg/dL Final  . BUN 03/24/2016 54* 6 - 20 mg/dL Final  . Creatinine, Ser 03/24/2016 1.46* 0.44 - 1.00 mg/dL Final  . Calcium 03/24/2016 9.4  8.9 - 10.3 mg/dL Final  . Total Protein 03/24/2016 8.8* 6.5 - 8.1 g/dL Final  . Albumin 03/24/2016 2.9* 3.5 - 5.0 g/dL Final  . AST 03/24/2016 21  15 - 41 U/L Final  . ALT 03/24/2016 13* 14 - 54 U/L Final  . Alkaline Phosphatase 03/24/2016 72  38 - 126 U/L Final  . Total Bilirubin 03/24/2016 0.5  0.3 - 1.2 mg/dL Final   . GFR calc non Af Amer 03/24/2016 35* >60 mL/min Final  . GFR calc Af Amer 03/24/2016 41* >60 mL/min Final   Comment: (NOTE) The eGFR has been calculated using the CKD EPI equation. This calculation has not been validated in all clinical situations. eGFR's persistently <60 mL/min signify possible Chronic Kidney Disease.   . Anion gap 03/24/2016 5  5 - 15 Final  . WBC 03/24/2016 5.7  3.6 - 11.0 K/uL Final  . RBC 03/24/2016 3.01* 3.80 - 5.20 MIL/uL Final  . Hemoglobin 03/24/2016 8.6* 12.0 - 16.0 g/dL Final  . HCT 03/24/2016 25.5* 35.0 - 47.0 % Final  . MCV 03/24/2016 84.9  80.0 - 100.0 fL Final  . MCH 03/24/2016 28.6  26.0 - 34.0 pg Final  . MCHC 03/24/2016 33.7  32.0 - 36.0 g/dL Final  . RDW 03/24/2016 19.3* 11.5 - 14.5 % Final  . Platelets 03/24/2016 332  150 - 440 K/uL Final  . Neutrophils Relative % 03/24/2016 57  % Final  . Neutro Abs 03/24/2016 3.3  1.4 - 6.5 K/uL Final  . Lymphocytes Relative 03/24/2016 29  % Final  . Lymphs Abs 03/24/2016 1.7  1.0 - 3.6 K/uL Final  . Monocytes Relative 03/24/2016 9  % Final  . Monocytes Absolute 03/24/2016 0.5  0.2 - 0.9 K/uL Final  . Eosinophils Relative 03/24/2016 4  % Final  . Eosinophils Absolute 03/24/2016 0.2  0 - 0.7 K/uL Final  . Basophils Relative 03/24/2016 1  % Final  . Basophils Absolute 03/24/2016 0.1  0 - 0.1 K/uL Final  . Iron 03/24/2016 59  28 - 170 ug/dL Final  . TIBC 03/24/2016 330  250 - 450 ug/dL Final  . Saturation Ratios 03/24/2016 18  10.4 - 31.8 % Final  . UIBC 03/24/2016 271  ug/dL Final  . Ferritin 03/24/2016 108  11 - 307 ng/mL Final  Hospital Outpatient Visit on 03/22/2016  Component Date Value Ref Range Status  . Sodium 03/22/2016 136  135 - 145 mmol/L Final  . Potassium 03/22/2016 4.9  3.5 - 5.1 mmol/L Final  . Chloride 03/22/2016 104  101 -  111 mmol/L Final  . CO2 03/22/2016 28  22 - 32 mmol/L Final  . Glucose, Bld 03/22/2016 131* 65 - 99 mg/dL Final  . BUN 03/22/2016 45* 6 - 20 mg/dL Final  .  Creatinine, Ser 03/22/2016 1.24* 0.44 - 1.00 mg/dL Final  . Calcium 03/22/2016 9.8  8.9 - 10.3 mg/dL Final  . GFR calc non Af Amer 03/22/2016 43* >60 mL/min Final  . GFR calc Af Amer 03/22/2016 49* >60 mL/min Final   Comment: (NOTE) The eGFR has been calculated using the CKD EPI equation. This calculation has not been validated in all clinical situations. eGFR's persistently <60 mL/min signify possible Chronic Kidney Disease.   Georgiann Hahn gap 03/22/2016 4* 5 - 15 Final    Assessment:  SHEA SWALLEY is a 71 y.o. female with an IgG monoclonal gammopathy.  She has stage II myeloma.  SPEP on 01/25/2016 revealed a 0.9 gm/dL monoclonal spike.  She denies any bone pain or recurrent infections.  Calcium and creatinine are normal.  Urinalysis revealed 30+ protein.  Work-up on 02/14/2016 revealed a hematocrit of 29.1, hemoglobin 9.4, MCV 84.6, platelets 225,000, white count 5700 with an [place] of 3700.  SPEP revealed a 1.9 g/dL monoclonal spike.  Kappa free light chains were 237.2, lambda free light chains 38.7 with a ratio of 6.13 (0.26-1.65).  Beta 2-microglobulin was 3.9 (0.6-2.4).  BMP revealed a creatinine of 0.95 and calcium 9.0.  24 hour UPEP revealed 31 mg/24 hours of an IgG monoclonal protein with kappa light chain specificity.  Bence Jones protein was positive, kappa type.  Bone marrow on 02/29/2016 revealed a hypercellular marrow for age with plasmacytosis (plasma cells 10%).  There was trilineage hematopoiesis. Immunohistochemical stains highlighted the plasma cell component which generally showed a polyclonal staining pattern for kappa and lambda light chain although there was kappa light chain excess especially in several plasma cell clusters. The features were most suggestive of early plasma cell dyscrasia/neoplasm.  Cytogenetics were normal (27, XX).  Bone survey on 03/22/2016 revealed no focal lesion or acute bony abnormality.  SPEP has been followed: 0.9 gm/dL on 01/25/2016 and 1.9 on  02/14/2016.   She has a normocytic anemia.  Work-up on 01/25/2016 and 02/14/2016 revealed a normal ferritin (90), iron studies, folate, and thyroid function tests.  B12 was 312 (low normal) on 01/25/2016, but with a normal MMA on 02/14/2016 thus ruling out B12 deficiency.  Reticulocyte count is inappropiately low.  Last colonoscopy was 2 years ago.  Cardiac catheterization on 03/07/2016 revealed non-obstructive coronary artery disease.  She has severe pulmonary artery hypertension with moderate to severely elevated right heart filling pressures and moderately elevated left heart filling pressures.   Symptomatically, she has felt poorly.  She has back, leg, and left inguinal pain.  She is sleeping poorly secondary to ongoing diuresis from Lasix.  She has progressive anemia (hemoglobin 8.6).  Calcium is 10.3 (corrected).  Albumen is 2.9.  Creatinine has increased to 1.46 (CrCl 35 ml/min) on Lasix.  Plan: 1.  Labs today:  CBC with diff, CMP, SPEP, ferritin, iron studies, sed rate, LDH. 2.  Discuss results of bone survey- negative. 3.  Discuss patient's symptomatology with diffuse pain.  Discuss progressive anemia.  Discuss declining albumen.  Concern for symptomatic myeloma.  Discuss PET scan to assess diffuse bone symptoms.  Discuss consideration of treatment (RVD Lite or VRd wekly).  Side effects of medications reviewed.  Patient interested in proceeding with preauthorization of Revlimid. 4.  Follow-up FISH studies on bone  marrow. 5.  Schedule PET scan. 6.  RTC after PET scan for review and finalization of treatment plan.   Lequita Asal, MD  03/24/2016, 11:55 AM

## 2016-03-24 NOTE — Progress Notes (Signed)
Patient here today for bone survey results. Patient states she is miserable taking the Lasix.  Not sleeping - up an down to the BR all night.  Patient states she is "angry".  Patient c/o pain with lower back.

## 2016-03-26 ENCOUNTER — Encounter: Payer: Self-pay | Admitting: Hematology and Oncology

## 2016-03-26 DIAGNOSIS — C9000 Multiple myeloma not having achieved remission: Secondary | ICD-10-CM | POA: Insufficient documentation

## 2016-03-26 DIAGNOSIS — M549 Dorsalgia, unspecified: Secondary | ICD-10-CM | POA: Insufficient documentation

## 2016-03-26 NOTE — Progress Notes (Signed)
START ON PATHWAY REGIMEN - Multiple Myeloma     A cycle is every 21 days:     Bortezomib      Lenalidomide      Dexamethasone   **Always confirm dose/schedule in your pharmacy ordering system**    Patient Characteristics: Newly Diagnosed, Transplant Eligible, Standard Risk R-ISS Staging: II Disease Classification: Newly Diagnosed Is Patient Eligible for Transplant? Transplant Eligible Risk Status: Standard Risk  Intent of Therapy: Non-Curative / Palliative Intent, Discussed with Patient

## 2016-03-27 LAB — PROTEIN ELECTROPHORESIS, SERUM
A/G Ratio: 0.6 — ABNORMAL LOW (ref 0.7–1.7)
Albumin ELP: 2.9 g/dL (ref 2.9–4.4)
Alpha-1-Globulin: 0.3 g/dL (ref 0.0–0.4)
Alpha-2-Globulin: 0.6 g/dL (ref 0.4–1.0)
Beta Globulin: 1.1 g/dL (ref 0.7–1.3)
Gamma Globulin: 3.2 g/dL — ABNORMAL HIGH (ref 0.4–1.8)
Globulin, Total: 5.2 g/dL — ABNORMAL HIGH (ref 2.2–3.9)
M-Spike, %: 1.7 g/dL — ABNORMAL HIGH
Total Protein ELP: 8.1 g/dL (ref 6.0–8.5)

## 2016-03-30 ENCOUNTER — Other Ambulatory Visit: Payer: Self-pay | Admitting: Hematology and Oncology

## 2016-03-30 DIAGNOSIS — C9000 Multiple myeloma not having achieved remission: Secondary | ICD-10-CM

## 2016-03-30 MED ORDER — LENALIDOMIDE 10 MG PO CAPS: ORAL_CAPSULE | ORAL | 1 refills | Status: DC

## 2016-03-31 ENCOUNTER — Encounter
Admission: RE | Admit: 2016-03-31 | Discharge: 2016-03-31 | Disposition: A | Discharge status: Home / Self Care | Payer: Medicare Other | Source: Ambulatory Visit | Attending: Hematology and Oncology | Admitting: Hematology and Oncology

## 2016-03-31 DIAGNOSIS — C9000 Multiple myeloma not having achieved remission: Secondary | ICD-10-CM | POA: Diagnosis not present

## 2016-03-31 DIAGNOSIS — M47816 Spondylosis without myelopathy or radiculopathy, lumbar region: Secondary | ICD-10-CM | POA: Diagnosis not present

## 2016-03-31 DIAGNOSIS — M5136 Other intervertebral disc degeneration, lumbar region: Secondary | ICD-10-CM | POA: Diagnosis not present

## 2016-03-31 LAB — GLUCOSE, CAPILLARY: Glucose-Capillary: 106 mg/dL — ABNORMAL HIGH (ref 65–99)

## 2016-03-31 LAB — TISSUE HYBRIDIZATION (BONE MARROW)-NCBH

## 2016-03-31 MED ORDER — FLUDEOXYGLUCOSE F - 18 (FDG) INJECTION
11.8300 | Freq: Once | INTRAVENOUS | Status: AC | PRN
  Administered 2016-03-31: 11.83 via INTRAVENOUS

## 2016-04-03 ENCOUNTER — Telehealth: Payer: Self-pay | Admitting: *Deleted

## 2016-04-03 NOTE — Telephone Encounter (Signed)
Asking when [name] can restart her Metformin post PT scan done last week. Advised per PET tech ok to restart her Metformin now

## 2016-04-06 ENCOUNTER — Other Ambulatory Visit
Admission: RE | Admit: 2016-04-06 | Discharge: 2016-04-06 | Disposition: A | Discharge status: Home / Self Care | Payer: Medicare Other | Source: Ambulatory Visit | Attending: Cardiology | Admitting: Cardiology

## 2016-04-06 DIAGNOSIS — I5022 Chronic systolic (congestive) heart failure: Secondary | ICD-10-CM | POA: Diagnosis not present

## 2016-04-06 LAB — BASIC METABOLIC PANEL
ANION GAP: 4 — AB (ref 5–15)
BUN: 29 mg/dL — AB (ref 6–20)
CALCIUM: 9.7 mg/dL (ref 8.9–10.3)
CO2: 29 mmol/L (ref 22–32)
CREATININE: 1.02 mg/dL — AB (ref 0.44–1.00)
Chloride: 101 mmol/L (ref 101–111)
GFR calc Af Amer: >60 mL/min (ref >60)
GFR, EST NON AFRICAN AMERICAN: 54 mL/min — AB (ref >60)
GLUCOSE: 150 mg/dL — AB (ref 65–99)
Potassium: 4.7 mmol/L (ref 3.5–5.1)
Sodium: 134 mmol/L — ABNORMAL LOW (ref 135–145)

## 2016-04-07 ENCOUNTER — Encounter (HOSPITAL_COMMUNITY): Payer: Self-pay

## 2016-04-07 ENCOUNTER — Telehealth: Payer: Self-pay | Admitting: *Deleted

## 2016-04-10 ENCOUNTER — Other Ambulatory Visit: Payer: Self-pay | Admitting: Internal Medicine

## 2016-04-10 DIAGNOSIS — I10 Essential (primary) hypertension: Secondary | ICD-10-CM

## 2016-04-11 ENCOUNTER — Encounter: Payer: Self-pay | Admitting: Hematology and Oncology

## 2016-04-11 ENCOUNTER — Inpatient Hospital Stay: Payer: Medicare Other | Attending: Hematology and Oncology | Admitting: Hematology and Oncology

## 2016-04-11 ENCOUNTER — Inpatient Hospital Stay: Payer: Medicare Other

## 2016-04-11 VITALS — BP 147/79 | HR 58 | Temp 95.6°F | Resp 18 | Wt 194.0 lb

## 2016-04-11 DIAGNOSIS — Z8049 Family history of malignant neoplasm of other genital organs: Secondary | ICD-10-CM | POA: Diagnosis not present

## 2016-04-11 DIAGNOSIS — I10 Essential (primary) hypertension: Secondary | ICD-10-CM | POA: Diagnosis not present

## 2016-04-11 DIAGNOSIS — J45909 Unspecified asthma, uncomplicated: Secondary | ICD-10-CM

## 2016-04-11 DIAGNOSIS — M5136 Other intervertebral disc degeneration, lumbar region: Secondary | ICD-10-CM

## 2016-04-11 DIAGNOSIS — Z7984 Long term (current) use of oral hypoglycemic drugs: Secondary | ICD-10-CM | POA: Diagnosis not present

## 2016-04-11 DIAGNOSIS — J341 Cyst and mucocele of nose and nasal sinus: Secondary | ICD-10-CM | POA: Diagnosis not present

## 2016-04-11 DIAGNOSIS — C9000 Multiple myeloma not having achieved remission: Secondary | ICD-10-CM

## 2016-04-11 DIAGNOSIS — Z7982 Long term (current) use of aspirin: Secondary | ICD-10-CM | POA: Diagnosis not present

## 2016-04-11 DIAGNOSIS — G4730 Sleep apnea, unspecified: Secondary | ICD-10-CM | POA: Diagnosis not present

## 2016-04-11 DIAGNOSIS — K5900 Constipation, unspecified: Secondary | ICD-10-CM

## 2016-04-11 DIAGNOSIS — Z79899 Other long term (current) drug therapy: Secondary | ICD-10-CM | POA: Diagnosis not present

## 2016-04-11 DIAGNOSIS — Z803 Family history of malignant neoplasm of breast: Secondary | ICD-10-CM

## 2016-04-11 DIAGNOSIS — I700 Atherosclerosis of aorta: Secondary | ICD-10-CM | POA: Diagnosis not present

## 2016-04-11 DIAGNOSIS — I2721 Secondary pulmonary arterial hypertension: Secondary | ICD-10-CM

## 2016-04-11 DIAGNOSIS — I2510 Atherosclerotic heart disease of native coronary artery without angina pectoris: Secondary | ICD-10-CM | POA: Diagnosis not present

## 2016-04-11 DIAGNOSIS — D649 Anemia, unspecified: Secondary | ICD-10-CM | POA: Diagnosis not present

## 2016-04-11 DIAGNOSIS — M549 Dorsalgia, unspecified: Secondary | ICD-10-CM

## 2016-04-11 DIAGNOSIS — E119 Type 2 diabetes mellitus without complications: Secondary | ICD-10-CM

## 2016-04-11 DIAGNOSIS — F419 Anxiety disorder, unspecified: Secondary | ICD-10-CM | POA: Diagnosis not present

## 2016-04-11 DIAGNOSIS — Z8619 Personal history of other infectious and parasitic diseases: Secondary | ICD-10-CM

## 2016-04-11 LAB — FERRITIN: Ferritin: 117 ng/mL (ref 11–307)

## 2016-04-11 LAB — CBC WITH DIFFERENTIAL/PLATELET
Basophils Absolute: 0 10*3/uL (ref 0–0.1)
Basophils Relative: 1 %
Eosinophils Absolute: 0.1 10*3/uL (ref 0–0.7)
Eosinophils Relative: 2 %
HCT: 29.2 % — ABNORMAL LOW (ref 35.0–47.0)
Hemoglobin: 9.7 g/dL — ABNORMAL LOW (ref 12.0–16.0)
Lymphocytes Relative: 29 %
Lymphs Abs: 1.4 10*3/uL (ref 1.0–3.6)
MCH: 28.2 pg (ref 26.0–34.0)
MCHC: 33.2 g/dL (ref 32.0–36.0)
MCV: 85 fL (ref 80.0–100.0)
Monocytes Absolute: 0.4 10*3/uL (ref 0.2–0.9)
Monocytes Relative: 9 %
Neutro Abs: 2.9 10*3/uL (ref 1.4–6.5)
Neutrophils Relative %: 59 %
Platelets: 255 10*3/uL (ref 150–440)
RBC: 3.43 MIL/uL — ABNORMAL LOW (ref 3.80–5.20)
RDW: 18.7 % — ABNORMAL HIGH (ref 11.5–14.5)
WBC: 4.9 10*3/uL (ref 3.6–11.0)

## 2016-04-11 LAB — SAMPLE TO BLOOD BANK

## 2016-04-11 LAB — IRON AND TIBC
Iron: 69 ug/dL (ref 28–170)
Saturation Ratios: 20 % (ref 10.4–31.8)
TIBC: 346 ug/dL (ref 250–450)
UIBC: 277 ug/dL

## 2016-04-11 LAB — SEDIMENTATION RATE: Sed Rate: 93 mm/hr — ABNORMAL HIGH (ref 0–30)

## 2016-04-11 NOTE — Progress Notes (Signed)
Patient here today for full body scan results.

## 2016-04-11 NOTE — Progress Notes (Signed)
[facility]-  Cancer Center  Clinic day:  04/11/2016   Chief Complaint: [name] is a 71 y.o. female with stage II multiple myeloma who is seen for review of interval PET scan and discussion regarding direction of therapy.  HPI: The patient was last seen in the medical oncology clinic on 03/24/2016.  At that time, she felt "lousy".  She had back, leg, and left inguinal pain.  She was sleeping poorly secondary to ongoing diuresis from Lasix.  She had progressive anemia (hemoglobin 8.6).  Calcium was 10.3 (corrected).  Albumen is 2.9.  Creatinine had increased to 1.46 (CrCl 35 ml/min) on Lasix.  SPEP was 1.7 gm/dL.  We discussed indications for treatment.  A PET scan was scheduled.  PET scan on 03/31/2016 revealed no significant abnormal hypermetabolic activity is identified in the head, neck, chest, abdomen, pelvis, or skeleton.  There were evolutionary findings in the right groin/upper thigh hematoma.  There was subcutaneous edema in the calves (right > left).  There was chronic bilateral sacroiliitis.  There was lower lumbar spondylosis and degenerative disc disease.  There was prominent stool throughout the colon favors constipation.  There was coronary, aortic arch, and branch vessel atherosclerotic vascular disease.  There was a mucous retention cyst in the left maxillary sinus.  BMP on 04/06/2016 revealed a BUN 29, creatinine 1.02, and calcium 9.7.  During the interim, her Lasix was cut in half. She states that the Lasix threw "her whole life off".  She has a slight cough.  She denies any fever.  She is eating better.  She remains anxious.  The patient states that she has several follow-up appointments. She sees Dr. Ingal, cardiologist, on 04/18/2016. She sees Dr. Fisher on 04/20/2016.    Past Medical History:  Diagnosis Date  . Asthma   . Diabetes mellitus without complication (HCC)   . History of measles   . Hypertension     Past Surgical History:  Procedure  Laterality Date  . Cyst(solitary) of breast:removed    . RIGHT/LEFT HEART CATH AND CORONARY ANGIOGRAPHY N/A 03/07/2016   Procedure: Right/Left Heart Cath and Coronary Angiography;  Surgeon: Christopher End, MD;  Location: [facility] INVASIVE CV LAB;  Service: Cardiovascular;  Laterality: N/A;  . TUBAL LIGATION      Family History  Problem Relation Age of Onset  . Cancer Mother     Uterine cancer  . Diabetes Mother   . Heart disease Mother   . Heart failure Mother   . Parkinson's disease Father   . Cancer Sister     breast cancer  . Diabetes Brother     Non-insulin Dependent Diabetes Mellitus  . Mesothelioma Brother   . Hypertension Sister   . Diabetes Sister     Non-insulin dependent Diabetes Mellitus    Social History:  reports that she has never smoked. She has never used smokeless tobacco. She reports that she does not drink alcohol or use drugs.  She is originally [place]  Her husband was born in [place].  They met in middle school in [place].  She has been married to her husband x 52 years.  The patient is accompanied by her husband, [name], today.  Allergies:  Allergies  Allergen Reactions  . Hydrochlorothiazide     Leg Cramps  . Penicillins Rash    Current Medications: Current Outpatient Prescriptions  Medication Sig Dispense Refill  . ADVAIR HFA 115-21 MCG/ACT inhaler Inhale 2 puffs into the   lungs as needed.     . albuterol (PROAIR HFA) 108 (90 Base) MCG/ACT inhaler Inhale 2 puffs into the lungs every 6 (six) hours as needed. 18 Inhaler 3  . amLODipine (NORVASC) 5 MG tablet Take 1 tablet (5 mg total) by mouth daily. 30 tablet 5  . aspirin EC 81 MG tablet Take 1 tablet (81 mg total) by mouth daily. 90 tablet 3  . fluticasone furoate-vilanterol (BREO ELLIPTA) 200-25 MCG/INH AEPB Inhale 1 puff into the lungs daily. FOR ASTHMA J45.20 1 each 5  . furosemide (LASIX) 20 MG tablet Take 2 tablets (40 mg total) by mouth daily. 60 tablet 1  .  HYDROcodone-acetaminophen (NORCO) 7.5-325 MG tablet Take 1 tablet by mouth every 6 (six) hours as needed for moderate pain. 60 tablet 0  . lenalidomide (REVLIMID) 10 MG capsule Take one capsule daily on days 1-14 every 21 days. 14 capsule 1  . levalbuterol (XOPENEX) 1.25 MG/3ML nebulizer solution Take 1.25 mg by nebulization every 8 (eight) hours as needed for wheezing. 72 mL 3  . lisinopril (PRINIVIL,ZESTRIL) 40 MG tablet Take 1 tablet (40 mg total) by mouth daily. 30 tablet 1  . lovastatin (MEVACOR) 20 MG tablet Take 1 tablet (20 mg total) by mouth at bedtime. 90 tablet 4  . meloxicam (MOBIC) 15 MG tablet Take 1 tablet (15 mg total) by mouth daily as needed for pain. Take with food 90 tablet 4  . metFORMIN (GLUCOPHAGE-XR) 750 MG 24 hr tablet Take 2 tablets by mouth daily.  4  . metoprolol (LOPRESSOR) 50 MG tablet Take 1 tablet (50 mg total) by mouth 2 (two) times daily. 180 tablet 3  . mupirocin cream (BACTROBAN) 2 % Apply 1 application topically 2 (two) times daily. 30 g 1  . potassium chloride (K-DUR) 10 MEQ tablet Take 2 tablets (20 mEq total) by mouth daily. 60 tablet 1  . spironolactone (ALDACTONE) 25 MG tablet Take 1 tablet (25 mg total) by mouth daily. 30 tablet 2  . traZODone (DESYREL) 100 MG tablet TAKE 1 TABLET BY MOUTH EVERY NIGHT AT BEDTIME AS NEEDED FOR INSOMNIA 90 tablet 4   No current facility-administered medications for this visit.     Review of Systems:  GENERAL:  Feels "ok".  No fevers or sweats.  Weight up 5 pounds. PERFORMANCE STATUS (ECOG):  1 HEENT:  Chronic vision changes.  No visual changes, runny nose, sore throat, mouth sores or tenderness. Lungs:  No shortness of breath.  Slight cough.  No hemoptysis. Cardiac:  No chest pain, palpitations, orthopnea, or PND. GI:  Eating better.  No nausea, vomiting, diarrhea, constipation, melena or hematochezia.  Colonoscopy up-to-date. GU:  No urgency, dysuria, or hematuria.  Lasix cut in half. Musculoskeletal:  Lower back  pain.  No joint pain.  No muscle tenderness. Extremities:  Lower extremity swelling, improved.  Legs and feet hurt.   Skin:  Right leg peeling.  No other rashes or skin changes. Neuro:  No headache, numbness or weakness, balance or coordination issues. Endocrine:  Diabetes.  No thyroid issues, hot flashes or night sweats. Psych:  Anxiety.  No mood changes, depression.  Poor sleep. Pain:  Lower back pain.  Review of systems:  All other systems reviewed and found to be negative.  Physical Exam: Blood pressure (!) 147/79, pulse (!) 58, temperature (!) 95.6 F (35.3 C), temperature source Tympanic, resp. rate 18, weight 194 lb (88 kg). GENERAL:  Slightly fatigued appearing woman sitting in a wheelchair in the exam room in no   acute distress.  She requires a 2 person assist onto the exam table. MENTAL STATUS:  Alert and oriented to person, place and time. HEAD:  Graying blonde/brown hair.  Normocephalic, atraumatic, face symmetric, no Cushingoid features. EYES:  Glasses.  Blue eyes.  Pupils equal round and reactive to light and accomodation.  No conjunctivitis or scleral icterus. ENT:  Oropharynx clear without lesion.  Tongue normal. Mucous membranes moist.  RESPIRATORY:  Clear to auscultation without rales, wheezes or rhonchi. CARDIOVASCULAR:  Regular rate and rhythm without murmur, rub or gallop. ABDOMEN:  Soft, non-tender, with active bowel sounds, and no hepatosplenomegaly.  No masses. SKIN:  Peeling/desquamation lower extremities.  Blister.  No ulcers. EXTREMITIES:  Chronic lower extremity edema, improved.  No skin discoloration or tenderness.  No palpable cords. LYMPH NODES: No palpable cervical, supraclavicular, axillary or inguinal adenopathy  NEUROLOGICAL: Unremarkable. PSYCH:  Appropriate   No visits with results within 3 Day(s) from this visit.  Latest known visit with results is:  Hospital Outpatient Visit on 04/06/2016  Component Date Value Ref Range Status  . Sodium 04/06/2016  134* 135 - 145 mmol/L Final  . Potassium 04/06/2016 4.7  3.5 - 5.1 mmol/L Final  . Chloride 04/06/2016 101  101 - 111 mmol/L Final  . CO2 04/06/2016 29  22 - 32 mmol/L Final  . Glucose, Bld 04/06/2016 150* 65 - 99 mg/dL Final  . BUN 04/06/2016 29* 6 - 20 mg/dL Final  . Creatinine, Ser 04/06/2016 1.02* 0.44 - 1.00 mg/dL Final  . Calcium 04/06/2016 9.7  8.9 - 10.3 mg/dL Final  . GFR calc non Af Amer 04/06/2016 54* >60 mL/min Final  . GFR calc Af Amer 04/06/2016 >60  >60 mL/min Final   Comment: (NOTE) The eGFR has been calculated using the CKD EPI equation. This calculation has not been validated in all clinical situations. eGFR's persistently <60 mL/min signify possible Chronic Kidney Disease.   Georgiann Hahn gap 04/06/2016 4* 5 - 15 Final    Assessment:  NYA MONDS is a 71 y.o. female with stage II myeloma.  SPEP on 01/25/2016 revealed a 0.9 gm/dL IgG monoclonal gammopathy.  She denied any bone pain or recurrent infections.  Calcium and creatinine were normal.  Urinalysis revealed 30+ protein.  Work-up on 02/14/2016 revealed a hematocrit of 29.1, hemoglobin 9.4, MCV 84.6, platelets 225,000, white count 5700 with an [place] of 3700.  SPEP revealed a 1.9 g/dL monoclonal spike.  Kappa free light chains were 237.2, lambda free light chains 38.7 with a ratio of 6.13 (0.26-1.65).  Beta 2-microglobulin was 3.9 (0.6-2.4).  BMP revealed a creatinine of 0.95 and calcium 9.0.  24 hour UPEP revealed 31 mg/24 hours of an IgG monoclonal protein with kappa light chain specificity.  Bence Jones protein was positive, kappa type.  Bone marrow on 02/29/2016 revealed a hypercellular marrow for age with plasmacytosis (plasma cells 10%).  There was trilineage hematopoiesis. Immunohistochemical stains highlighted the plasma cell component which generally showed a polyclonal staining pattern for kappa and lambda light chain although there was kappa light chain excess especially in several plasma cell clusters. The features  were most suggestive of early plasma cell dyscrasia/neoplasm.  Cytogenetics were normal (49, XX).  Bone survey on 03/22/2016 revealed no focal lesion or acute bony abnormality.  PET scan on 03/31/2016 revealed no significant abnormal hypermetabolic activity is identified in the head, neck, chest, abdomen, pelvis, or skeleton.  SPEP has been followed: 0.9 gm/dL on 01/25/2016, 1.9 on 02/14/2016, and 1.7 on 03/24/2016.  She has a normocytic anemia.  Work-up on 01/25/2016 and 02/14/2016 revealed a normal ferritin (90), iron studies, folate, and thyroid function tests.  B12 was 312 (low normal) on 01/25/2016, but with a normal MMA on 02/14/2016 thus ruling out B12 deficiency.  Reticulocyte count is inappropiately low.  Last colonoscopy was 2 years ago.  Cardiac catheterization on 03/07/2016 revealed non-obstructive coronary artery disease.  She has severe pulmonary artery hypertension with moderate to severely elevated right heart filling pressures and moderately elevated left heart filling pressures.   Symptomatically, she is feeling better with decreased Lasix.  Creatinine improved from 1.46 to 1.02.  Plan: 1.  Review PET scan.  No evidence of PET + metabolic bone disease. 2.  Re-review indications for treatment.  No evidence of bone disease.  Discuss improvement in creatinine and anemia. 3.  Labs today:  CBC with diff, ferritin, iron studies, sed rate, hold tube. 4.  Guaiac cards x 3. 5.  RTC in 1 week for MD assessment.    C , MD  04/11/2016, 8:29 AM  

## 2016-04-12 DIAGNOSIS — E119 Type 2 diabetes mellitus without complications: Secondary | ICD-10-CM | POA: Diagnosis not present

## 2016-04-12 DIAGNOSIS — Z803 Family history of malignant neoplasm of breast: Secondary | ICD-10-CM | POA: Diagnosis not present

## 2016-04-12 DIAGNOSIS — J45909 Unspecified asthma, uncomplicated: Secondary | ICD-10-CM | POA: Diagnosis not present

## 2016-04-12 DIAGNOSIS — I10 Essential (primary) hypertension: Secondary | ICD-10-CM | POA: Diagnosis not present

## 2016-04-12 DIAGNOSIS — K5900 Constipation, unspecified: Secondary | ICD-10-CM | POA: Diagnosis not present

## 2016-04-12 DIAGNOSIS — J341 Cyst and mucocele of nose and nasal sinus: Secondary | ICD-10-CM | POA: Diagnosis not present

## 2016-04-12 DIAGNOSIS — M5136 Other intervertebral disc degeneration, lumbar region: Secondary | ICD-10-CM | POA: Diagnosis not present

## 2016-04-12 DIAGNOSIS — I2721 Secondary pulmonary arterial hypertension: Secondary | ICD-10-CM | POA: Diagnosis not present

## 2016-04-12 DIAGNOSIS — Z8619 Personal history of other infectious and parasitic diseases: Secondary | ICD-10-CM | POA: Diagnosis not present

## 2016-04-12 DIAGNOSIS — I2510 Atherosclerotic heart disease of native coronary artery without angina pectoris: Secondary | ICD-10-CM | POA: Diagnosis not present

## 2016-04-12 DIAGNOSIS — G4730 Sleep apnea, unspecified: Secondary | ICD-10-CM | POA: Diagnosis not present

## 2016-04-12 DIAGNOSIS — D649 Anemia, unspecified: Secondary | ICD-10-CM | POA: Diagnosis not present

## 2016-04-12 DIAGNOSIS — C9000 Multiple myeloma not having achieved remission: Secondary | ICD-10-CM | POA: Diagnosis not present

## 2016-04-12 DIAGNOSIS — Z7984 Long term (current) use of oral hypoglycemic drugs: Secondary | ICD-10-CM | POA: Diagnosis not present

## 2016-04-12 DIAGNOSIS — Z8049 Family history of malignant neoplasm of other genital organs: Secondary | ICD-10-CM | POA: Diagnosis not present

## 2016-04-12 DIAGNOSIS — I700 Atherosclerosis of aorta: Secondary | ICD-10-CM | POA: Diagnosis not present

## 2016-04-12 DIAGNOSIS — Z7982 Long term (current) use of aspirin: Secondary | ICD-10-CM | POA: Diagnosis not present

## 2016-04-12 DIAGNOSIS — M549 Dorsalgia, unspecified: Secondary | ICD-10-CM | POA: Diagnosis not present

## 2016-04-12 DIAGNOSIS — Z79899 Other long term (current) drug therapy: Secondary | ICD-10-CM | POA: Diagnosis not present

## 2016-04-17 DIAGNOSIS — M5136 Other intervertebral disc degeneration, lumbar region: Secondary | ICD-10-CM | POA: Diagnosis not present

## 2016-04-17 DIAGNOSIS — Z7984 Long term (current) use of oral hypoglycemic drugs: Secondary | ICD-10-CM | POA: Diagnosis not present

## 2016-04-17 DIAGNOSIS — J341 Cyst and mucocele of nose and nasal sinus: Secondary | ICD-10-CM | POA: Diagnosis not present

## 2016-04-17 DIAGNOSIS — Z8619 Personal history of other infectious and parasitic diseases: Secondary | ICD-10-CM | POA: Diagnosis not present

## 2016-04-17 DIAGNOSIS — Z8049 Family history of malignant neoplasm of other genital organs: Secondary | ICD-10-CM | POA: Diagnosis not present

## 2016-04-17 DIAGNOSIS — I2510 Atherosclerotic heart disease of native coronary artery without angina pectoris: Secondary | ICD-10-CM | POA: Diagnosis not present

## 2016-04-17 DIAGNOSIS — Z7982 Long term (current) use of aspirin: Secondary | ICD-10-CM | POA: Diagnosis not present

## 2016-04-17 DIAGNOSIS — I2721 Secondary pulmonary arterial hypertension: Secondary | ICD-10-CM | POA: Diagnosis not present

## 2016-04-17 DIAGNOSIS — Z803 Family history of malignant neoplasm of breast: Secondary | ICD-10-CM | POA: Diagnosis not present

## 2016-04-17 DIAGNOSIS — G4730 Sleep apnea, unspecified: Secondary | ICD-10-CM | POA: Diagnosis not present

## 2016-04-17 DIAGNOSIS — J45909 Unspecified asthma, uncomplicated: Secondary | ICD-10-CM | POA: Diagnosis not present

## 2016-04-17 DIAGNOSIS — C9000 Multiple myeloma not having achieved remission: Secondary | ICD-10-CM | POA: Diagnosis not present

## 2016-04-17 DIAGNOSIS — I700 Atherosclerosis of aorta: Secondary | ICD-10-CM | POA: Diagnosis not present

## 2016-04-17 DIAGNOSIS — M549 Dorsalgia, unspecified: Secondary | ICD-10-CM | POA: Diagnosis not present

## 2016-04-17 DIAGNOSIS — K5900 Constipation, unspecified: Secondary | ICD-10-CM | POA: Diagnosis not present

## 2016-04-17 DIAGNOSIS — Z79899 Other long term (current) drug therapy: Secondary | ICD-10-CM | POA: Diagnosis not present

## 2016-04-17 DIAGNOSIS — E119 Type 2 diabetes mellitus without complications: Secondary | ICD-10-CM | POA: Diagnosis not present

## 2016-04-17 DIAGNOSIS — D649 Anemia, unspecified: Secondary | ICD-10-CM | POA: Diagnosis not present

## 2016-04-17 DIAGNOSIS — I10 Essential (primary) hypertension: Secondary | ICD-10-CM | POA: Diagnosis not present

## 2016-04-18 ENCOUNTER — Other Ambulatory Visit: Payer: Self-pay

## 2016-04-18 ENCOUNTER — Encounter: Payer: Self-pay | Admitting: Cardiology

## 2016-04-18 ENCOUNTER — Ambulatory Visit (INDEPENDENT_AMBULATORY_CARE_PROVIDER_SITE_OTHER): Payer: Medicare Other | Admitting: Cardiology

## 2016-04-18 VITALS — BP 126/64 | HR 56 | Ht 61.0 in | Wt 192.0 lb

## 2016-04-18 DIAGNOSIS — I2510 Atherosclerotic heart disease of native coronary artery without angina pectoris: Secondary | ICD-10-CM | POA: Diagnosis not present

## 2016-04-18 DIAGNOSIS — I5022 Chronic systolic (congestive) heart failure: Secondary | ICD-10-CM

## 2016-04-18 DIAGNOSIS — I10 Essential (primary) hypertension: Secondary | ICD-10-CM

## 2016-04-18 DIAGNOSIS — I350 Nonrheumatic aortic (valve) stenosis: Secondary | ICD-10-CM

## 2016-04-18 DIAGNOSIS — D649 Anemia, unspecified: Secondary | ICD-10-CM

## 2016-04-18 LAB — OCCULT BLOOD X 1 CARD TO LAB, STOOL
Fecal Occult Bld: NEGATIVE
Fecal Occult Bld: NEGATIVE
Fecal Occult Bld: NEGATIVE

## 2016-04-18 MED ORDER — FUROSEMIDE 20 MG PO TABS: 20.0000 mg | ORAL_TABLET | Freq: Every day | ORAL | 3 refills | Status: DC

## 2016-04-18 MED ORDER — POTASSIUM CHLORIDE ER 10 MEQ PO TBCR: 10.0000 meq | EXTENDED_RELEASE_TABLET | Freq: Every day | ORAL | 3 refills | Status: DC

## 2016-04-18 MED ORDER — SACUBITRIL-VALSARTAN 49-51 MG PO TABS: 1.0000 | ORAL_TABLET | Freq: Two times a day (BID) | ORAL | 3 refills | Status: DC

## 2016-04-18 NOTE — Patient Instructions (Signed)
Medication Instructions:  Your physician has recommended you make the following change in your medication:  1. DECREASE Furosemide to 20 mg Once daily 2. DECREASE Potassium 10 meq Once daily (Always take with Furosemide) 3. STOP Lisinopril (NO MORE AFTER TODAY) 4.START Entresto 49/51 mg Twice a day (DO NOT START UNTIL FRIDAY)  Medication Samples have been provided to the patient.  Drug name: Delene Loll       Strength: 49/51 mg        Qty: 2 boxes  LOT: [id number]  Exp.Date: Jul 2019  Dosing instructions: Take 1 tablet twice a day.   Labwork: Your physician recommends that you return for lab work in: 1 week for BMP. Go to [facility] the [facility] and check in at the first desk and they will direct you where to go.     Follow-Up: Your physician recommends that you schedule a follow-up appointment in: 2 weeks with Dr. Yvone Neu.   It was a pleasure seeing you today here in the office. Please do not hesitate to give [place] a call back if you have any further questions. [place], BSN

## 2016-04-18 NOTE — Progress Notes (Signed)
Cardiology Office Note   Date:  04/18/2016   ID:  [name], DOB 03-Jun-1945, MRN [id number]  Referring Doctor:  Lelon Huh, MD   Cardiologist:   Wende Bushy, MD   Reason for consultation:  Chief Complaint  Patient presents with  . OTHER    1 month f/u no complaints today. Meds reviewed verbally with pt.      History of Present Illness: [name] is a 71 y.o. female who presents for ffup after heart cath  Since last visit, patient has been doing quite well. No chest pain. Shortness of breath is at baseline.  She is taking Lasix 40 mg daily and despite this decrease in dose, she complains that she continues to pee a lot and having to wake up in the night to go to the bathroom.   ROS:  Please see the history of present illness. Aside from mentioned under HPI, all other systems are reviewed and negative.    Past Medical History:  Diagnosis Date  . Asthma   . Diabetes mellitus without complication ([place])   . History of measles   . Hypertension     Past Surgical History:  Procedure Laterality Date  . Cyst(solitary) of breast:removed    . RIGHT/LEFT HEART CATH AND CORONARY ANGIOGRAPHY N/A 03/07/2016   Procedure: Right/Left Heart Cath and Coronary Angiography;  Surgeon: Nelva Bush, MD;  Location: [facility];  Service: Cardiovascular;  Laterality: N/A;  . TUBAL LIGATION       reports that she has never smoked. She has never used smokeless tobacco. She reports that she does not drink alcohol or use drugs.   family history includes Cancer in her mother and sister; Diabetes in her brother, mother, and sister; Heart disease in her mother; Heart failure in her mother; Hypertension in her sister; Mesothelioma in her brother; Parkinson's disease in her father.   Outpatient Medications Prior to Visit  Medication Sig Dispense Refill  . ADVAIR HFA 115-21 MCG/ACT inhaler Inhale 2 puffs into the lungs as needed.     Marland Kitchen albuterol (PROAIR HFA) 108 (90 Base)  MCG/ACT inhaler Inhale 2 puffs into the lungs every 6 (six) hours as needed. 18 Inhaler 3  . amLODipine (NORVASC) 5 MG tablet Take 1 tablet (5 mg total) by mouth daily. 30 tablet 5  . aspirin EC 81 MG tablet Take 1 tablet (81 mg total) by mouth daily. 90 tablet 3  . fluticasone furoate-vilanterol (BREO ELLIPTA) 200-25 MCG/INH AEPB Inhale 1 puff into the lungs daily. FOR ASTHMA J45.20 1 each 5  . HYDROcodone-acetaminophen (NORCO) 7.5-325 MG tablet Take 1 tablet by mouth every 6 (six) hours as needed for moderate pain. 60 tablet 0  . lenalidomide (REVLIMID) 10 MG capsule Take one capsule daily on days 1-14 every 21 days. 14 capsule 1  . levalbuterol (XOPENEX) 1.25 MG/3ML nebulizer solution Take 1.25 mg by nebulization every 8 (eight) hours as needed for wheezing. 72 mL 3  . lovastatin (MEVACOR) 20 MG tablet Take 1 tablet (20 mg total) by mouth at bedtime. 90 tablet 4  . meloxicam (MOBIC) 15 MG tablet Take 1 tablet (15 mg total) by mouth daily as needed for pain. Take with food 90 tablet 4  . metFORMIN (GLUCOPHAGE-XR) 750 MG 24 hr tablet Take 2 tablets by mouth daily.  4  . metoprolol (LOPRESSOR) 50 MG tablet Take 1 tablet (50 mg total) by mouth 2 (two) times daily. 180 tablet 3  . mupirocin cream (BACTROBAN) 2 %  Apply 1 application topically 2 (two) times daily. 30 g 1  . spironolactone (ALDACTONE) 25 MG tablet Take 1 tablet (25 mg total) by mouth daily. 30 tablet 2  . potassium chloride (K-DUR) 10 MEQ tablet Take 2 tablets (20 mEq total) by mouth daily. 60 tablet 1  . traZODone (DESYREL) 100 MG tablet TAKE 1 TABLET BY MOUTH EVERY NIGHT AT BEDTIME AS NEEDED FOR INSOMNIA (Patient not taking: Reported on 04/18/2016) 90 tablet 4  . furosemide (LASIX) 20 MG tablet Take 2 tablets (40 mg total) by mouth daily. (Patient not taking: Reported on 04/18/2016) 60 tablet 1  . lisinopril (PRINIVIL,ZESTRIL) 40 MG tablet Take 1 tablet (40 mg total) by mouth daily. (Patient not taking: Reported on 04/18/2016) 30 tablet 1     No facility-administered medications prior to visit.      Allergies: Hydrochlorothiazide and Penicillins    PHYSICAL EXAM: VS:  BP 126/64 (BP Location: Right Arm, Patient Position: Sitting, Cuff Size: Large)   Pulse (!) 56   Ht 5\' 1"  (1.549 m)   Wt 192 lb (87.1 kg)   BMI 36.28 kg/m  , Body mass index is 36.28 kg/m. Wt Readings from Last 3 Encounters:  04/18/16 192 lb (87.1 kg)  04/11/16 194 lb (88 kg)  03/14/16 195 lb 4 oz (88.6 kg)    GENERAL:  well developed, well nourished, obese, not in acute distress HEENT: normocephalic, pink conjunctivae, anicteric sclerae, no xanthelasma, normal dentition, oropharynx clear NECK:  no neck vein engorgement, JVP normal, no hepatojugular reflux, carotid upstroke brisk and symmetric, no bruit, no thyromegaly, no lymphadenopathy LUNGS:  good respiratory effort, clear to auscultation bilaterally CV:  PMI not displaced, no thrills, no lifts, S1 and S2 within normal limits, no palpable S3 or S4, no murmurs, no rubs, no gallops ABD:  Soft, nontender, nondistended, normoactive bowel sounds, no abdominal aortic bruit, no hepatomegaly, no splenomegaly MS: nontender back, no kyphosis, no scoliosis, no joint deformities EXT:  2+ DP/PT pulses, +1 edema, no varicosities, no cyanosis, no clubbing SKIN: warm, nondiaphoretic, normal turgor, no ulcers NEUROPSYCH: alert, oriented to person, place, and time, sensory/motor grossly intact, normal mood, appropriate affect    Recent Labs: 01/25/2016: TSH 1.360 02/09/2016: B Natriuretic Peptide 1,592.0 03/24/2016: ALT 13 04/06/2016: BUN 29; Creatinine, Ser 1.02; Potassium 4.7; Sodium 134 04/11/2016: Hemoglobin 9.7; Platelets 255   Lipid Panel    Component Value Date/Time   CHOL 139 06/25/2015 0959   TRIG 64 06/25/2015 0959   HDL 59 06/25/2015 0959   CHOLHDL 2.4 06/25/2015 0959   LDLCALC 67 06/25/2015 0959     Other studies Reviewed:  EKG:  The ekg from 02/01/2016 was personally reviewed by me and it  revealed sinus rhythm, 90 BPM PACs nonspecific ST-T wave changes. QT 360 ms, QTC 413 ms.  Additional studies/ records that were reviewed personally reviewed by me today include:  Echo 08/19/2015: Left ventricle: There was moderate concentric hypertrophy.   Systolic function was normal. The estimated ejection fraction was   in the range of 50% to 55%. - Aortic valve: There was moderate stenosis. Valve area (VTI): 1.39   cm^2. Valve area (Vmax): 1.11 cm^2. Valve area (Vmean): 1.02   cm^2. - Mitral valve: There was moderate regurgitation. - Left atrium: The atrium was mildly dilated. - Right atrium: The atrium was mildly dilated. - Pericardium, extracardiac: A trivial pericardial effusion was   identified posterior to the heart.  Echo 02/25/2016: Left ventricle: The cavity size was normal. There was moderate  concentric hypertrophy. Systolic function was severely reduced.   The estimated ejection fraction was in the range of 25% to 30%.   Diffuse hypokinesis. Regional wall motion abnormalities cannot be   excluded. Features are consistent with a pseudonormal left   ventricular filling pattern, with concomitant abnormal relaxation   and increased filling pressure (grade 2 diastolic dysfunction). - Aortic valve: Transvalvular velocity was elevated.   Degree of aortic valve stenosis is likely underestimated   secondary to severely depressed EF. Valve area (VTI): 1.41 cm^2. - Mitral valve: Calcified annulus. There was mild regurgitation. - Left atrium: The atrium was mildly dilated. - Right ventricle: Systolic function was mildly to moderately   reduced. - Tricuspid valve: There was mild-moderate regurgitation. - Pulmonary arteries: Systolic pressure was moderately elevated. PA   peak pressure: 55 mm Hg (S).  RLHCath 03/07/2016: Conclusions: 1. Mild to moderate, non-obstructive coronary artery disease, as detailed below. 2. Severe pulmonary hypertension with moderately to severely  elevated right heart filling pressure. 3. Moderately elevated left heart filling pressure. 4. Reduced Fick cardiac output/index. 5. No significant aortic valve stenosis. 6. Severe systemic hypertension.  Recommendations: 1. Continue medical therapy for non-ischemic cardiomyopathy. 2. Add amlodipine 5 mg daily and increase furosemide to 40 mg BID. 3. Return to clinic in 3 days for basic metabolic panel; do not restart metformin until results have been reviewed. 4. Risk factor modification and medical therapy to prevent progression of coronary artery disease.    ASSESSMENT AND PLAN: Congestive heart failure, now systolic dysfunction, chronic Most likely nonischemic cardiomyopathy, likely from uncontrolled hypertension Volume status improving. Weight down to 195. Continue medical therapy: Patient will like to do a trial of lower dose of Lasix. Do a trial of Lasix 20 daily, potassium chloride 10 mEq daily.  [place] lisinopril and start Entresto 49/51mg  bid. Repeat BMP in 1 week. Continue metoprolol 50 twice a day. Continue spironolactone 25 daily. Continue daily weights and low sodium diet.  If weight gain of > 2 lbs over 24 hours, or > 5 lbs over 1 week, please call office.   Aortic stenosis Recommendations the same as from 03/14/2016: Likely no severe AS per heart cath. Continue to evaluate with echocardiogram serially.  Hypertension BP is well controlled. Continue monitoring BP. Continue current medical therapy and lifestyle changes.   Nonobstructive CAD  Recommendation similar to 03/14/2016 office visit. Continue medical therapy. Aspirin 81 mg by mouth daily. LDL goal is less than 70. PCP following labs.  Current medicines are reviewed at length with the patient today.  The patient does not have concerns regarding medicines.  Labs/ tests ordered today include:  Orders Placed This Encounter  Procedures  . Basic metabolic panel  . EKG 12-Lead   Disposition:   FU with  undersigned in 1 month  I spent at least 40 minutes with the patient today and more than 50% of the time was spent counseling the patient and coordinating care.    Signed, Wende Bushy, MD  04/18/2016 12:46 PM    [place]  This note was generated in part with voice recognition software and I apologize for any typographical errors that were not detected and corrected.

## 2016-04-19 NOTE — Telephone Encounter (Signed)
error 

## 2016-04-20 ENCOUNTER — Encounter: Payer: Self-pay | Admitting: Family Medicine

## 2016-04-20 ENCOUNTER — Inpatient Hospital Stay (HOSPITAL_BASED_OUTPATIENT_CLINIC_OR_DEPARTMENT_OTHER): Payer: Medicare Other | Admitting: Hematology and Oncology

## 2016-04-20 ENCOUNTER — Encounter: Payer: Self-pay | Admitting: Hematology and Oncology

## 2016-04-20 ENCOUNTER — Ambulatory Visit (INDEPENDENT_AMBULATORY_CARE_PROVIDER_SITE_OTHER): Payer: Medicare Other | Admitting: Family Medicine

## 2016-04-20 VITALS — BP 121/63 | HR 53 | Temp 96.3°F | Resp 18 | Wt 192.0 lb

## 2016-04-20 VITALS — BP 130/60 | HR 52 | Temp 97.4°F | Resp 18 | Wt 192.0 lb

## 2016-04-20 DIAGNOSIS — I2721 Secondary pulmonary arterial hypertension: Secondary | ICD-10-CM | POA: Diagnosis not present

## 2016-04-20 DIAGNOSIS — M5136 Other intervertebral disc degeneration, lumbar region: Secondary | ICD-10-CM

## 2016-04-20 DIAGNOSIS — Z8049 Family history of malignant neoplasm of other genital organs: Secondary | ICD-10-CM | POA: Diagnosis not present

## 2016-04-20 DIAGNOSIS — J45909 Unspecified asthma, uncomplicated: Secondary | ICD-10-CM | POA: Diagnosis not present

## 2016-04-20 DIAGNOSIS — G4700 Insomnia, unspecified: Secondary | ICD-10-CM

## 2016-04-20 DIAGNOSIS — M549 Dorsalgia, unspecified: Secondary | ICD-10-CM | POA: Diagnosis not present

## 2016-04-20 DIAGNOSIS — Z7982 Long term (current) use of aspirin: Secondary | ICD-10-CM | POA: Diagnosis not present

## 2016-04-20 DIAGNOSIS — Z803 Family history of malignant neoplasm of breast: Secondary | ICD-10-CM | POA: Diagnosis not present

## 2016-04-20 DIAGNOSIS — I2510 Atherosclerotic heart disease of native coronary artery without angina pectoris: Secondary | ICD-10-CM

## 2016-04-20 DIAGNOSIS — L98491 Non-pressure chronic ulcer of skin of other sites limited to breakdown of skin: Secondary | ICD-10-CM | POA: Diagnosis not present

## 2016-04-20 DIAGNOSIS — Z79899 Other long term (current) drug therapy: Secondary | ICD-10-CM | POA: Diagnosis not present

## 2016-04-20 DIAGNOSIS — J341 Cyst and mucocele of nose and nasal sinus: Secondary | ICD-10-CM

## 2016-04-20 DIAGNOSIS — I700 Atherosclerosis of aorta: Secondary | ICD-10-CM | POA: Diagnosis not present

## 2016-04-20 DIAGNOSIS — Z7984 Long term (current) use of oral hypoglycemic drugs: Secondary | ICD-10-CM

## 2016-04-20 DIAGNOSIS — F419 Anxiety disorder, unspecified: Secondary | ICD-10-CM | POA: Diagnosis not present

## 2016-04-20 DIAGNOSIS — K5900 Constipation, unspecified: Secondary | ICD-10-CM | POA: Diagnosis not present

## 2016-04-20 DIAGNOSIS — I10 Essential (primary) hypertension: Secondary | ICD-10-CM

## 2016-04-20 DIAGNOSIS — D649 Anemia, unspecified: Secondary | ICD-10-CM | POA: Diagnosis not present

## 2016-04-20 DIAGNOSIS — E119 Type 2 diabetes mellitus without complications: Secondary | ICD-10-CM

## 2016-04-20 DIAGNOSIS — C9000 Multiple myeloma not having achieved remission: Secondary | ICD-10-CM | POA: Diagnosis not present

## 2016-04-20 DIAGNOSIS — G4730 Sleep apnea, unspecified: Secondary | ICD-10-CM | POA: Diagnosis not present

## 2016-04-20 DIAGNOSIS — Z8619 Personal history of other infectious and parasitic diseases: Secondary | ICD-10-CM

## 2016-04-20 MED ORDER — TRAZODONE HCL 100 MG PO TABS: ORAL_TABLET | ORAL | 4 refills | Status: DC

## 2016-04-20 MED ORDER — SILVER SULFADIAZINE 1 % EX CREA: 1.0000 "application " | TOPICAL_CREAM | Freq: Every day | CUTANEOUS | 0 refills | Status: DC

## 2016-04-20 NOTE — Progress Notes (Signed)
Patient: [name] Female    DOB: March 14, 1945   71 y.o.   MRN: [id number] Visit Date: 04/20/2016  Today's Provider: Lelon Huh, MD   Chief Complaint  Patient presents with  . Cellulitis   Subjective:    HPI Follow up of Cellulitis and infected sebaceous cyst:  Patient was last seen for this problem 4 weeks ago. Management during that visit includes prescribing Doxycycline. Patient reports good compliance with treatment and good tolerance. Patient states that the infection is no better. She has ulcers on the lower right leg that is draining. Her husband reports that lesion had completely scabbed over so  He stopped applying dressing, but scab came off a few days ago and has small open area now. There are also three new coin sized lesions starting to develop.     Allergies  Allergen Reactions  . Hydrochlorothiazide     Leg Cramps  . Penicillins Rash     Current Outpatient Prescriptions:  .  ADVAIR HFA 115-21 MCG/ACT inhaler, Inhale 2 puffs into the lungs as needed. , Disp: , Rfl:  .  albuterol (PROAIR HFA) 108 (90 Base) MCG/ACT inhaler, Inhale 2 puffs into the lungs every 6 (six) hours as needed., Disp: 18 Inhaler, Rfl: 3 .  amLODipine (NORVASC) 5 MG tablet, Take 1 tablet (5 mg total) by mouth daily., Disp: 30 tablet, Rfl: 5 .  aspirin EC 81 MG tablet, Take 1 tablet (81 mg total) by mouth daily., Disp: 90 tablet, Rfl: 3 .  fluticasone furoate-vilanterol (BREO ELLIPTA) 200-25 MCG/INH AEPB, Inhale 1 puff into the lungs daily. FOR ASTHMA J45.20, Disp: 1 each, Rfl: 5 .  furosemide (LASIX) 20 MG tablet, Take 1 tablet (20 mg total) by mouth daily., Disp: 30 tablet, Rfl: 3 .  HYDROcodone-acetaminophen (NORCO) 7.5-325 MG tablet, Take 1 tablet by mouth every 6 (six) hours as needed for moderate pain., Disp: 60 tablet, Rfl: 0 .  lenalidomide (REVLIMID) 10 MG capsule, Take one capsule daily on days 1-14 every 21 days., Disp: 14 capsule, Rfl: 1 .  levalbuterol (XOPENEX) 1.25 MG/3ML  nebulizer solution, Take 1.25 mg by nebulization every 8 (eight) hours as needed for wheezing., Disp: 72 mL, Rfl: 3 .  lovastatin (MEVACOR) 20 MG tablet, Take 1 tablet (20 mg total) by mouth at bedtime., Disp: 90 tablet, Rfl: 4 .  meloxicam (MOBIC) 15 MG tablet, Take 1 tablet (15 mg total) by mouth daily as needed for pain. Take with food, Disp: 90 tablet, Rfl: 4 .  metFORMIN (GLUCOPHAGE-XR) 750 MG 24 hr tablet, Take 2 tablets by mouth daily., Disp: , Rfl: 4 .  metoprolol (LOPRESSOR) 50 MG tablet, Take 1 tablet (50 mg total) by mouth 2 (two) times daily., Disp: 180 tablet, Rfl: 3 .  mupirocin cream (BACTROBAN) 2 %, Apply 1 application topically 2 (two) times daily., Disp: 30 g, Rfl: 1 .  potassium chloride (K-DUR) 10 MEQ tablet, Take 1 tablet (10 mEq total) by mouth daily., Disp: 30 tablet, Rfl: 3 .  sacubitril-valsartan (ENTRESTO) 49-51 MG, Take 1 tablet by mouth 2 (two) times daily., Disp: 60 tablet, Rfl: 3 .  spironolactone (ALDACTONE) 25 MG tablet, Take 1 tablet (25 mg total) by mouth daily., Disp: 30 tablet, Rfl: 2 .  traZODone (DESYREL) 100 MG tablet, TAKE 1 TABLET BY MOUTH EVERY NIGHT AT BEDTIME AS NEEDED FOR INSOMNIA, Disp: 90 tablet, Rfl: 4  Review of Systems  Constitutional: Negative for appetite change, chills, fatigue and fever.  Respiratory: Negative  for chest tightness and shortness of breath.   Cardiovascular: Positive for leg swelling. Negative for chest pain and palpitations.  Gastrointestinal: Negative for abdominal pain, nausea and vomiting.  Skin: Positive for color change and wound.  Neurological: Negative for dizziness and weakness.    Social History  Substance Use Topics  . Smoking status: Never Smoker  . Smokeless tobacco: Never Used  . Alcohol use No     Comment: Occasional alcohol use   Objective:   BP 130/60 (BP Location: Right Arm, Patient Position: Sitting, Cuff Size: Normal)   Pulse (!) 52   Temp 97.4 F (36.3 C) (Oral)   Resp 18   Wt 192 lb (87.1 kg)  Comment: weight done at home this morning  SpO2 98% Comment: room air  BMI 36.28 kg/m  There were no vitals filed for this visit.   Physical Exam  General appearance: alert, well developed, well nourished, cooperative and in no distress Head: Normocephalic, without obvious abnormality, atraumatic Respiratory: Respirations even and unlabored, normal respiratory rate Extremities: No gross deformities Skin: Partially scabbed shallow skin ulceration with scant yellow discharge three coins sized patches of tender erythema near main lesion. 1+ lower leg edema.      Assessment & Plan:     1. Skin ulcer, limited to breakdown of skin ([place]) Improved, but not healed. Again discussed benefits of referral to wound care clinic but patient does not want to have see another doctor. Extensively counseled on wound care. Can gently clean with warm soapy water daily, pat dry completely and apply silvadene cream. Keep sores covered with dry dressing at all other times. Will recheck in a month.  - silver sulfADIAZINE (SILVADENE) 1 % cream; Apply 1 application topically daily.  Dispense: 85 g; Refill: 0  2. Insomnia, unspecified type Refill trazodone.       Lelon Huh, MD  [facility]

## 2016-04-20 NOTE — Progress Notes (Signed)
Patient offers no complaints today. 

## 2016-04-20 NOTE — Progress Notes (Addendum)
[facility]-  Cancer Center  Clinic day:  04/20/2016   Chief Complaint: [name] is a 71 y.o. female with stage II multiple myeloma who is seen for 1 week assessment.  HPI: The patient was last seen in the medical oncology clinic on 04/11/2016.  At that time, she had improved significantly after a decrease in Lasix.  Creatinine and hemoglobin had improved.  PET scan revealed no bone disease.  Labs revealed a hematocrit of 29.2, hemoglobin 9.7, MCV 85, platelets 255,000, WBC 4900 with an ANC of 2900.  Ferritin was 117.  Iron studies included a saturation of 20% and a TIBC of 346.  Sed rate was 93.  Guaiac cards x 3 were negative.  Symptomatically, she denies any complaints. Overall she continues to feel fairly good. Regarding her leg, she has been changed to a different antibiotic. She denies any fevers.  Weight has been stable between 191 - 193 pounds.  Hemoglobin has improved from 8.1 on 03/14/2016 to 8.6 on 03/24/2016 and 9.7 on 04/11/2016.   Past Medical History:  Diagnosis Date  . Asthma   . Diabetes mellitus without complication (HCC)   . History of measles   . Hypertension     Past Surgical History:  Procedure Laterality Date  . Cyst(solitary) of breast:removed    . RIGHT/LEFT HEART CATH AND CORONARY ANGIOGRAPHY N/A 03/07/2016   Procedure: Right/Left Heart Cath and Coronary Angiography;  Surgeon: Christopher End, MD;  Location: [facility] INVASIVE CV LAB;  Service: Cardiovascular;  Laterality: N/A;  . TUBAL LIGATION      Family History  Problem Relation Age of Onset  . Cancer Mother     Uterine cancer  . Diabetes Mother   . Heart disease Mother   . Heart failure Mother   . Parkinson's disease Father   . Cancer Sister     breast cancer  . Diabetes Brother     Non-insulin Dependent Diabetes Mellitus  . Mesothelioma Brother   . Hypertension Sister   . Diabetes Sister     Non-insulin dependent Diabetes Mellitus    Social History:  reports that she  has never smoked. She has never used smokeless tobacco. She reports that she does not drink alcohol or use drugs.  She is [place]  Her husband was born in [place], [place].  They met in middle school in [place].  She has been married to her husband x 52 years.  The patient is accompanied by her husband, [name], today.  Allergies:  Allergies  Allergen Reactions  . Hydrochlorothiazide     Leg Cramps  . Penicillins Rash    Current Medications: Current Outpatient Prescriptions  Medication Sig Dispense Refill  . ADVAIR HFA 115-21 MCG/ACT inhaler Inhale 2 puffs into the lungs as needed.     . albuterol (PROAIR HFA) 108 (90 Base) MCG/ACT inhaler Inhale 2 puffs into the lungs every 6 (six) hours as needed. 18 Inhaler 3  . amLODipine (NORVASC) 5 MG tablet Take 1 tablet (5 mg total) by mouth daily. 30 tablet 5  . aspirin EC 81 MG tablet Take 1 tablet (81 mg total) by mouth daily. 90 tablet 3  . fluticasone furoate-vilanterol (BREO ELLIPTA) 200-25 MCG/INH AEPB Inhale 1 puff into the lungs daily. FOR ASTHMA J45.20 1 each 5  . furosemide (LASIX) 20 MG tablet Take 1 tablet (20 mg total) by mouth daily. 30 tablet 3  . HYDROcodone-acetaminophen (NORCO) 7.5-325 MG tablet Take 1 tablet by   mouth every 6 (six) hours as needed for moderate pain. 60 tablet 0  . lenalidomide (REVLIMID) 10 MG capsule Take one capsule daily on days 1-14 every 21 days. 14 capsule 1  . levalbuterol (XOPENEX) 1.25 MG/3ML nebulizer solution Take 1.25 mg by nebulization every 8 (eight) hours as needed for wheezing. 72 mL 3  . lovastatin (MEVACOR) 20 MG tablet Take 1 tablet (20 mg total) by mouth at bedtime. 90 tablet 4  . meloxicam (MOBIC) 15 MG tablet Take 1 tablet (15 mg total) by mouth daily as needed for pain. Take with food 90 tablet 4  . metFORMIN (GLUCOPHAGE-XR) 750 MG 24 hr tablet Take 2 tablets by mouth daily.  4  . metoprolol (LOPRESSOR) 50 MG tablet Take 1 tablet (50 mg total) by mouth 2 (two)  times daily. 180 tablet 3  . potassium chloride (K-DUR) 10 MEQ tablet Take 1 tablet (10 mEq total) by mouth daily. 30 tablet 3  . sacubitril-valsartan (ENTRESTO) 49-51 MG Take 1 tablet by mouth 2 (two) times daily. 60 tablet 3  . silver sulfADIAZINE (SILVADENE) 1 % cream Apply 1 application topically daily. 85 g 0  . spironolactone (ALDACTONE) 25 MG tablet Take 1 tablet (25 mg total) by mouth daily. 30 tablet 2  . traZODone (DESYREL) 100 MG tablet TAKE 1 TABLET BY MOUTH EVERY NIGHT AT BEDTIME AS NEEDED FOR INSOMNIA 90 tablet 4  . montelukast (SINGULAIR) 10 MG tablet Take 10 mg by mouth as needed.  4   No current facility-administered medications for this visit.     Review of Systems:  GENERAL:  Feels "better".  No fevers or sweats.  Weight stable between 191 -193 pounds. PERFORMANCE STATUS (ECOG):  1 HEENT:  Chronic vision changes.  No visual changes, runny nose, sore throat, mouth sores or tenderness. Lungs:  Shortness of breath.  No cough.  No hemoptysis. Cardiac:  No chest pain, palpitations, orthopnea, or PND. GI:  No nausea, vomiting, diarrhea, constipation, melena or hematochezia.  Colonoscopy up-to-date. GU:  No urgency, dysuria, or hematuria. Musculoskeletal:  Lower back pain.  No joint pain.  No muscle tenderness. Extremities:  Lower extremity swelling, improved.  Legs and feet hurt.   Skin:  Right leg weeping.  No other rashes or skin changes. Neuro:  No headache, numbness or weakness, balance or coordination issues. Endocrine:  Diabetes.  No thyroid issues, hot flashes or night sweats. Psych:  Anxiety.  No mood changes, depression. Pain:  Lower back pain.  Review of systems:  All other systems reviewed and found to be negative.  Physical Exam: Blood pressure 121/63, pulse (!) 53, temperature (!) 96.3 F (35.7 C), temperature source Tympanic, resp. rate 18, weight 192 lb (87.1 kg). GENERAL:  Fatigued appearing woman sitting in a wheelchair in the exam room in no acute  distress.  MENTAL STATUS:  Alert and oriented to person, place and time. HEAD:  Graying blonde/brown hair.  Normocephalic, atraumatic, face symmetric, no Cushingoid features. EYES:  Glasses.  Blue eyes.  No conjunctivitis or scleral icterus. RESPIRATORY:  Clear to auscultation without rales, wheezes or rhonchi. CARDIOVASCULAR:  Regular rate and rhythm without murmur, rub or gallop. SKIN: 2 lesions right leg (deep).  Lesions dry. EXTREMITIES:  Chronic lower extremity edema  No skin discoloration or tenderness.  No palpable cords. NEUROLOGICAL: Unremarkable. PSYCH:  Appropriate   Orders Only on 04/18/2016  Component Date Value Ref Range Status  . Fecal Occult Bld 04/11/2016 NEGATIVE  NEGATIVE Final  . Fecal Occult Bld 04/12/2016 NEGATIVE  NEGATIVE Final  . Fecal Occult Bld 04/17/2016 NEGATIVE  NEGATIVE Final    Assessment:  ETHYL VILA is a 71 y.o. female with stage II myeloma.  SPEP on 01/25/2016 revealed a 0.9 gm/dL IgG monoclonal gammopathy.  She denied any bone pain or recurrent infections.  Calcium and creatinine were normal.  Urinalysis revealed 30+ protein.  Work-up on 02/14/2016 revealed a hematocrit of 29.1, hemoglobin 9.4, MCV 84.6, platelets 225,000, white count 5700 with an [place] of 3700.  SPEP revealed a 1.9 g/dL monoclonal spike.  Kappa free light chains were 237.2, lambda free light chains 38.7 with a ratio of 6.13 (0.26-1.65).  Beta 2-microglobulin was 3.9 (0.6-2.4).  BMP revealed a creatinine of 0.95 and calcium 9.0.  24 hour UPEP revealed 31 mg/24 hours of an IgG monoclonal protein with kappa light chain specificity.  Bence Jones protein was positive, kappa type.  Bone marrow on 02/29/2016 revealed a hypercellular marrow for age with plasmacytosis (plasma cells 10%).  There was trilineage hematopoiesis. Immunohistochemical stains highlighted the plasma cell component which generally showed a polyclonal staining pattern for kappa and lambda light chain although there was kappa  light chain excess especially in several plasma cell clusters. The features were most suggestive of early plasma cell dyscrasia/neoplasm.  Cytogenetics were normal (60, XX).  Bone survey on 03/22/2016 revealed no focal lesion or acute bony abnormality.  PET scan on 03/31/2016 revealed no significant abnormal hypermetabolic activity is identified in the head, neck, chest, abdomen, pelvis, or skeleton.  SPEP has been followed: 0.9 gm/dL on 01/25/2016, 1.9 on 02/14/2016, and 1.7 on 03/24/2016.   She has a normocytic anemia.  Work-up on 01/25/2016 and 02/14/2016 revealed a normal ferritin (90), iron studies, folate, and thyroid function tests.  B12 was 312 (low normal) on 01/25/2016, but with a normal MMA on 02/14/2016 thus ruling out B12 deficiency.  Reticulocyte count is inappropiately low.  Last colonoscopy was 2 years ago.  Guaiac cards were negative x 3 in 04/2016.  Cardiac catheterization on 03/07/2016 revealed non-obstructive coronary artery disease.  She has severe pulmonary artery hypertension with moderate to severely elevated right heart filling pressures and moderately elevated left heart filling pressures.   Symptomatically, she is feeling better.  She has slowly healing lower leg wounds.  Creatinine was 1.02 on 04/06/2016.  Hemoglobin continues to improve (8.1 to 9.7).  Plan: 1.  Review labs from last visit.  Iron stores appear adequate.  Guaiac cards are negative. 2.  Discuss plan to continue observation without treatment of myeloma as clinical and laboratory improvement. 3.  RTC on 05/15 for MD assess and labs (CBC with diff, CMP, SPEP, hold tube)   Lequita Asal, MD  04/20/2016, 3:13 PM

## 2016-04-27 ENCOUNTER — Other Ambulatory Visit
Admission: RE | Admit: 2016-04-27 | Discharge: 2016-04-27 | Disposition: A | Discharge status: Home / Self Care | Payer: Medicare Other | Source: Ambulatory Visit | Attending: Cardiology | Admitting: Cardiology

## 2016-04-27 DIAGNOSIS — I5022 Chronic systolic (congestive) heart failure: Secondary | ICD-10-CM | POA: Insufficient documentation

## 2016-04-27 LAB — BASIC METABOLIC PANEL
Anion gap: 6 (ref 5–15)
BUN: 34 mg/dL — ABNORMAL HIGH (ref 6–20)
CALCIUM: 9.6 mg/dL (ref 8.9–10.3)
CO2: 27 mmol/L (ref 22–32)
CREATININE: 1.13 mg/dL — AB (ref 0.44–1.00)
Chloride: 102 mmol/L (ref 101–111)
GFR, EST AFRICAN AMERICAN: 55 mL/min — AB (ref >60)
GFR, EST NON AFRICAN AMERICAN: 48 mL/min — AB (ref >60)
Glucose, Bld: 202 mg/dL — ABNORMAL HIGH (ref 65–99)
Potassium: 4.6 mmol/L (ref 3.5–5.1)
SODIUM: 135 mmol/L (ref 135–145)

## 2016-05-01 DIAGNOSIS — E113413 Type 2 diabetes mellitus with severe nonproliferative diabetic retinopathy with macular edema, bilateral: Secondary | ICD-10-CM | POA: Diagnosis not present

## 2016-05-03 ENCOUNTER — Ambulatory Visit (INDEPENDENT_AMBULATORY_CARE_PROVIDER_SITE_OTHER): Payer: Medicare Other | Admitting: Cardiology

## 2016-05-03 ENCOUNTER — Encounter: Payer: Self-pay | Admitting: Cardiology

## 2016-05-03 VITALS — BP 108/58 | HR 68 | Ht 61.0 in | Wt 194.0 lb

## 2016-05-03 DIAGNOSIS — I350 Nonrheumatic aortic (valve) stenosis: Secondary | ICD-10-CM | POA: Diagnosis not present

## 2016-05-03 DIAGNOSIS — I5022 Chronic systolic (congestive) heart failure: Secondary | ICD-10-CM | POA: Diagnosis not present

## 2016-05-03 DIAGNOSIS — I10 Essential (primary) hypertension: Secondary | ICD-10-CM | POA: Diagnosis not present

## 2016-05-03 DIAGNOSIS — I2510 Atherosclerotic heart disease of native coronary artery without angina pectoris: Secondary | ICD-10-CM | POA: Diagnosis not present

## 2016-05-03 NOTE — Progress Notes (Signed)
Cardiology Office Note   Date:  05/03/2016   ID:  XZARIA TEO, DOB 1945-02-11, MRN [id number]  Referring Doctor:  Lelon Huh, MD   Cardiologist:   Wende Bushy, MD   Reason for consultation:  Chief Complaint  Patient presents with  . other    2 week follow up. Meds reviewed by the pt.'s bottles. "doing well."       History of Present Illness: [name] is a 71 y.o. female who presents for ffup after med change  Since last visit, she has been doing quite well. She appreciates being on lower dose of lasix. Per husband, wt has been stable overall, up and down ~ 2 lbs, not more. No CP. Walking more.   ROS:  Please see the history of present illness. Aside from mentioned under HPI, all other systems are reviewed and negative.     Past Medical History:  Diagnosis Date  . Asthma   . Diabetes mellitus without complication ([place])   . History of measles   . Hypertension     Past Surgical History:  Procedure Laterality Date  . Cyst(solitary) of breast:removed    . RIGHT/LEFT HEART CATH AND CORONARY ANGIOGRAPHY N/A 03/07/2016   Procedure: Right/Left Heart Cath and Coronary Angiography;  Surgeon: Nelva Bush, MD;  Location: [facility];  Service: Cardiovascular;  Laterality: N/A;  . TUBAL LIGATION       reports that she has never smoked. She has never used smokeless tobacco. She reports that she does not drink alcohol or use drugs.   family history includes Cancer in her mother and sister; Diabetes in her brother, mother, and sister; Heart disease in her mother; Heart failure in her mother; Hypertension in her sister; Mesothelioma in her brother; Parkinson's disease in her father.   Outpatient Medications Prior to Visit  Medication Sig Dispense Refill  . ADVAIR HFA 115-21 MCG/ACT inhaler Inhale 2 puffs into the lungs as needed.     Marland Kitchen albuterol (PROAIR HFA) 108 (90 Base) MCG/ACT inhaler Inhale 2 puffs into the lungs every 6 (six) hours as needed. 18 Inhaler 3   . amLODipine (NORVASC) 5 MG tablet Take 1 tablet (5 mg total) by mouth daily. 30 tablet 5  . aspirin EC 81 MG tablet Take 1 tablet (81 mg total) by mouth daily. 90 tablet 3  . fluticasone furoate-vilanterol (BREO ELLIPTA) 200-25 MCG/INH AEPB Inhale 1 puff into the lungs daily. FOR ASTHMA J45.20 1 each 5  . furosemide (LASIX) 20 MG tablet Take 1 tablet (20 mg total) by mouth daily. 30 tablet 3  . HYDROcodone-acetaminophen (NORCO) 7.5-325 MG tablet Take 1 tablet by mouth every 6 (six) hours as needed for moderate pain. 60 tablet 0  . levalbuterol (XOPENEX) 1.25 MG/3ML nebulizer solution Take 1.25 mg by nebulization every 8 (eight) hours as needed for wheezing. 72 mL 3  . lovastatin (MEVACOR) 20 MG tablet Take 1 tablet (20 mg total) by mouth at bedtime. 90 tablet 4  . meloxicam (MOBIC) 15 MG tablet Take 1 tablet (15 mg total) by mouth daily as needed for pain. Take with food 90 tablet 4  . metFORMIN (GLUCOPHAGE-XR) 750 MG 24 hr tablet Take 2 tablets by mouth daily.  4  . metoprolol (LOPRESSOR) 50 MG tablet Take 1 tablet (50 mg total) by mouth 2 (two) times daily. 180 tablet 3  . montelukast (SINGULAIR) 10 MG tablet Take 10 mg by mouth as needed.  4  . potassium chloride (K-DUR) 10  MEQ tablet Take 1 tablet (10 mEq total) by mouth daily. 30 tablet 3  . sacubitril-valsartan (ENTRESTO) 49-51 MG Take 1 tablet by mouth 2 (two) times daily. 60 tablet 3  . silver sulfADIAZINE (SILVADENE) 1 % cream Apply 1 application topically daily. 85 g 0  . spironolactone (ALDACTONE) 25 MG tablet Take 1 tablet (25 mg total) by mouth daily. 30 tablet 2  . traZODone (DESYREL) 100 MG tablet TAKE 1 TABLET BY MOUTH EVERY NIGHT AT BEDTIME AS NEEDED FOR INSOMNIA 90 tablet 4  . lenalidomide (REVLIMID) 10 MG capsule Take one capsule daily on days 1-14 every 21 days. (Patient not taking: Reported on 05/03/2016) 14 capsule 1   No facility-administered medications prior to visit.      Allergies: Hydrochlorothiazide and Penicillins      PHYSICAL EXAM: VS:  BP (!) 108/58 (BP Location: Left Arm, Patient Position: Sitting, Cuff Size: Normal)   Pulse 68   Ht 5\' 1"  (1.549 m)   Wt 194 lb (88 kg)   BMI 36.66 kg/m  , Body mass index is 36.66 kg/m. Wt Readings from Last 3 Encounters:  05/03/16 194 lb (88 kg)  04/20/16 192 lb (87.1 kg)  04/20/16 192 lb (87.1 kg)    GENERAL:  well developed, well nourished, obese, not in acute distress HEENT: normocephalic, pink conjunctivae, anicteric sclerae, no xanthelasma, normal dentition, oropharynx clear NECK:  no neck vein engorgement, JVP normal, no hepatojugular reflux, carotid upstroke brisk and symmetric, no bruit, no thyromegaly, no lymphadenopathy LUNGS:  good respiratory effort, clear to auscultation bilaterally CV:  PMI not displaced, no thrills, no lifts, S1 and S2 within normal limits, no palpable S3 or S4, soft systolic murmur, no rubs, no gallops ABD:  Soft, nontender, nondistended, normoactive bowel sounds, no abdominal aortic bruit, no hepatomegaly, no splenomegaly MS: nontender back, no kyphosis, no scoliosis, no joint deformities EXT:  2+ DP/PT pulses, no pitting edema! no varicosities, no cyanosis, no clubbing SKIN: warm, nondiaphoretic, normal turgor, no ulcers NEUROPSYCH: alert, oriented to person, place, and time, sensory/motor grossly intact, normal mood, appropriate affect     Recent Labs: 01/25/2016: TSH 1.360 02/09/2016: B Natriuretic Peptide 1,592.0 03/24/2016: ALT 13 04/11/2016: Hemoglobin 9.7; Platelets 255 04/27/2016: BUN 34; Creatinine, Ser 1.13; Potassium 4.6; Sodium 135   Lipid Panel    Component Value Date/Time   CHOL 139 06/25/2015 0959   TRIG 64 06/25/2015 0959   HDL 59 06/25/2015 0959   CHOLHDL 2.4 06/25/2015 0959   LDLCALC 67 06/25/2015 0959     Other studies Reviewed:  EKG:  The ekg from 02/01/2016 was personally reviewed by me and it revealed sinus rhythm, 90 BPM PACs nonspecific ST-T wave changes. QT 360 ms, QTC 413 ms.  Additional  studies/ records that were reviewed personally reviewed by me today include:  Echo 08/19/2015: Left ventricle: There was moderate concentric hypertrophy.   Systolic function was normal. The estimated ejection fraction was   in the range of 50% to 55%. - Aortic valve: There was moderate stenosis. Valve area (VTI): 1.39   cm^2. Valve area (Vmax): 1.11 cm^2. Valve area (Vmean): 1.02   cm^2. - Mitral valve: There was moderate regurgitation. - Left atrium: The atrium was mildly dilated. - Right atrium: The atrium was mildly dilated. - Pericardium, extracardiac: A trivial pericardial effusion was   identified posterior to the heart.  Echo 02/25/2016: Left ventricle: The cavity size was normal. There was moderate   concentric hypertrophy. Systolic function was severely reduced.   The estimated ejection  fraction was in the range of 25% to 30%.   Diffuse hypokinesis. Regional wall motion abnormalities cannot be   excluded. Features are consistent with a pseudonormal left   ventricular filling pattern, with concomitant abnormal relaxation   and increased filling pressure (grade 2 diastolic dysfunction). - Aortic valve: Transvalvular velocity was elevated.   Degree of aortic valve stenosis is likely underestimated   secondary to severely depressed EF. Valve area (VTI): 1.41 cm^2. - Mitral valve: Calcified annulus. There was mild regurgitation. - Left atrium: The atrium was mildly dilated. - Right ventricle: Systolic function was mildly to moderately   reduced. - Tricuspid valve: There was mild-moderate regurgitation. - Pulmonary arteries: Systolic pressure was moderately elevated. PA   peak pressure: 55 mm Hg (S).  RLHCath 03/07/2016: Conclusions: 1. Mild to moderate, non-obstructive coronary artery disease, as detailed below. 2. Severe pulmonary hypertension with moderately to severely elevated right heart filling pressure. 3. Moderately elevated left heart filling pressure. 4. Reduced Fick  cardiac output/index. 5. No significant aortic valve stenosis. 6. Severe systemic hypertension.  Recommendations: 1. Continue medical therapy for non-ischemic cardiomyopathy. 2. Add amlodipine 5 mg daily and increase furosemide to 40 mg BID. 3. Return to clinic in 3 days for basic metabolic panel; do not restart metformin until results have been reviewed. 4. Risk factor modification and medical therapy to prevent progression of coronary artery disease.    ASSESSMENT AND PLAN: Congestive heart failure, systolic dysfunction, chronic Most likely nonischemic cardiomyopathy, likely from uncontrolled hypertension Volume status improved Continue medical therapy: Stable on lower dose of lasix 20, KCl 10 -- pt prefers too be on lower dose as she had been going to bathroom too frequently with higher doses. She has been maintaining stable weight anyway on this. Continue  Entresto 49/51mg  bid - will hold off on uptitration for now since BP in 100s.  Continue metoprolol 50 twice a day. Continue spironolactone 25 daily. Continue daily weights and low sodium diet.  If weight gain of > 2 lbs over 24 hours, or > 5 lbs over 1 week, please call office.  Check BMP. Ffup in 4-6 weeks. Consider repeat echo in 2-3 months time to reassess EF.   Aortic stenosis Recommendations the same as from 04/18/2016: Likely no severe AS per heart cath. Continue to evaluate with echocardiogram serially.  Hypertension BP much improved. BP is well controlled. Continue monitoring BP. Continue current medical therapy and lifestyle changes.  Nonobstructive CAD  Recommendations the same as from last visit 04/18/2016: Continue medical therapy. Aspirin 81 mg by mouth daily. LDL goal is less than 70. PCP following labs.  Current medicines are reviewed at length with the patient today.  The patient does not have concerns regarding medicines.  Labs/ tests ordered today include:  Orders Placed This Encounter  Procedures    . Basic metabolic panel   Disposition:   FU with Cardiology in 4-6 weeks  Signed, Wende Bushy, MD  05/03/2016 12:17 PM    [place]  This note was generated in part with voice recognition software and I apologize for any typographical errors that were not detected and corrected.

## 2016-05-03 NOTE — Patient Instructions (Addendum)
Labwork: We will call you with those results.    Follow-Up: Your physician recommends that you schedule a follow-up appointment in: 6 weeks with Dr. Saunders Revel.   It was a pleasure seeing you today here in the office. Please do not hesitate to give [place] a call back if you have any further questions. [place], BSN

## 2016-05-04 LAB — BASIC METABOLIC PANEL
BUN/Creatinine Ratio: 30 — ABNORMAL HIGH (ref 12–28)
BUN: 33 mg/dL — AB (ref 8–27)
CHLORIDE: 102 mmol/L (ref 96–106)
CO2: 25 mmol/L (ref 18–29)
Calcium: 10 mg/dL (ref 8.7–10.3)
Creatinine, Ser: 1.11 mg/dL — ABNORMAL HIGH (ref 0.57–1.00)
GFR calc Af Amer: 58 mL/min/{1.73_m2} — ABNORMAL LOW (ref >59)
GFR calc non Af Amer: 50 mL/min/{1.73_m2} — ABNORMAL LOW (ref >59)
GLUCOSE: 194 mg/dL — AB (ref 65–99)
Potassium: 5.1 mmol/L (ref 3.5–5.2)
Sodium: 139 mmol/L (ref 134–144)

## 2016-05-10 ENCOUNTER — Encounter: Payer: Self-pay | Admitting: Hematology and Oncology

## 2016-05-14 ENCOUNTER — Other Ambulatory Visit: Payer: Self-pay | Admitting: Family Medicine

## 2016-05-14 DIAGNOSIS — R609 Edema, unspecified: Secondary | ICD-10-CM

## 2016-05-16 ENCOUNTER — Inpatient Hospital Stay: Payer: Medicare Other | Attending: Hematology and Oncology

## 2016-05-16 ENCOUNTER — Inpatient Hospital Stay (HOSPITAL_BASED_OUTPATIENT_CLINIC_OR_DEPARTMENT_OTHER): Payer: Medicare Other | Admitting: Hematology and Oncology

## 2016-05-16 ENCOUNTER — Encounter: Payer: Self-pay | Admitting: Hematology and Oncology

## 2016-05-16 ENCOUNTER — Other Ambulatory Visit: Payer: Self-pay | Admitting: *Deleted

## 2016-05-16 VITALS — BP 100/61 | HR 62 | Temp 97.8°F | Wt 196.0 lb

## 2016-05-16 DIAGNOSIS — I2721 Secondary pulmonary arterial hypertension: Secondary | ICD-10-CM | POA: Insufficient documentation

## 2016-05-16 DIAGNOSIS — D649 Anemia, unspecified: Secondary | ICD-10-CM

## 2016-05-16 DIAGNOSIS — Z7982 Long term (current) use of aspirin: Secondary | ICD-10-CM | POA: Insufficient documentation

## 2016-05-16 DIAGNOSIS — C9000 Multiple myeloma not having achieved remission: Secondary | ICD-10-CM

## 2016-05-16 DIAGNOSIS — I2510 Atherosclerotic heart disease of native coronary artery without angina pectoris: Secondary | ICD-10-CM | POA: Insufficient documentation

## 2016-05-16 DIAGNOSIS — I10 Essential (primary) hypertension: Secondary | ICD-10-CM

## 2016-05-16 DIAGNOSIS — Z7984 Long term (current) use of oral hypoglycemic drugs: Secondary | ICD-10-CM | POA: Diagnosis not present

## 2016-05-16 DIAGNOSIS — Z8619 Personal history of other infectious and parasitic diseases: Secondary | ICD-10-CM

## 2016-05-16 DIAGNOSIS — E119 Type 2 diabetes mellitus without complications: Secondary | ICD-10-CM | POA: Insufficient documentation

## 2016-05-16 DIAGNOSIS — I429 Cardiomyopathy, unspecified: Secondary | ICD-10-CM | POA: Insufficient documentation

## 2016-05-16 DIAGNOSIS — I350 Nonrheumatic aortic (valve) stenosis: Secondary | ICD-10-CM

## 2016-05-16 DIAGNOSIS — J45909 Unspecified asthma, uncomplicated: Secondary | ICD-10-CM | POA: Insufficient documentation

## 2016-05-16 DIAGNOSIS — Z79899 Other long term (current) drug therapy: Secondary | ICD-10-CM

## 2016-05-16 LAB — CBC WITH DIFFERENTIAL/PLATELET
Basophils Absolute: 0 10*3/uL (ref 0–0.1)
Basophils Relative: 1 %
Eosinophils Absolute: 0.2 10*3/uL (ref 0–0.7)
Eosinophils Relative: 3 %
HCT: 29 % — ABNORMAL LOW (ref 35.0–47.0)
Hemoglobin: 9.8 g/dL — ABNORMAL LOW (ref 12.0–16.0)
Lymphocytes Relative: 29 %
Lymphs Abs: 1.5 10*3/uL (ref 1.0–3.6)
MCH: 28.9 pg (ref 26.0–34.0)
MCHC: 33.8 g/dL (ref 32.0–36.0)
MCV: 85.6 fL (ref 80.0–100.0)
Monocytes Absolute: 0.5 10*3/uL (ref 0.2–0.9)
Monocytes Relative: 10 %
Neutro Abs: 3 10*3/uL (ref 1.4–6.5)
Neutrophils Relative %: 57 %
Platelets: 253 10*3/uL (ref 150–440)
RBC: 3.38 MIL/uL — ABNORMAL LOW (ref 3.80–5.20)
RDW: 17.9 % — ABNORMAL HIGH (ref 11.5–14.5)
WBC: 5.2 10*3/uL (ref 3.6–11.0)

## 2016-05-16 LAB — COMPREHENSIVE METABOLIC PANEL
ALT: 12 U/L — ABNORMAL LOW (ref 14–54)
AST: 19 U/L (ref 15–41)
Albumin: 3.4 g/dL — ABNORMAL LOW (ref 3.5–5.0)
Alkaline Phosphatase: 74 U/L (ref 38–126)
Anion gap: 3 — ABNORMAL LOW (ref 5–15)
BUN: 35 mg/dL — ABNORMAL HIGH (ref 6–20)
CO2: 28 mmol/L (ref 22–32)
Calcium: 10.3 mg/dL (ref 8.9–10.3)
Chloride: 103 mmol/L (ref 101–111)
Creatinine, Ser: 1.13 mg/dL — ABNORMAL HIGH (ref 0.44–1.00)
GFR calc Af Amer: 55 mL/min — ABNORMAL LOW (ref >60)
GFR calc non Af Amer: 47 mL/min — ABNORMAL LOW (ref >60)
Glucose, Bld: 192 mg/dL — ABNORMAL HIGH (ref 65–99)
Potassium: 4.4 mmol/L (ref 3.5–5.1)
Sodium: 134 mmol/L — ABNORMAL LOW (ref 135–145)
Total Bilirubin: 0.4 mg/dL (ref 0.3–1.2)
Total Protein: 8.7 g/dL — ABNORMAL HIGH (ref 6.5–8.1)

## 2016-05-16 LAB — SAMPLE TO BLOOD BANK

## 2016-05-16 NOTE — Progress Notes (Signed)
[facility] day:  05/16/2016   Chief Complaint: [name] is a 71 y.o. female with stage II multiple myeloma who is seen for 1 month assessment.  HPI: The patient was last seen in the medical oncology clinic on 04/20/2016.  At that time, she was feeling better.  She had slowly healing lower leg wounds.  Creatinine was 1.02 on 04/06/2016.  Hemoglobin continued to improve (8.1 to 9.7).  During the interim, she has been "ducky". She continues to have leg and knee problems. Her legs are however improving/healing. Regarding her energy, she "does what I have to". She has a "tickle" sensation which is annoying with urination. She is not taking calcium supplementation.   Past Medical History:  Diagnosis Date  . Aortic stenosis   . Asthma   . Coronary artery disease   . Diabetes mellitus without complication ([place])   . History of measles   . Hypertension   . Nonischemic cardiomyopathy ([place])   . Pulmonary hypertension ([place])     Past Surgical History:  Procedure Laterality Date  . Cyst(solitary) of breast:removed    . RIGHT/LEFT HEART CATH AND CORONARY ANGIOGRAPHY N/A 03/07/2016   Procedure: Right/Left Heart Cath and Coronary Angiography;  Surgeon: Nelva Bush, MD;  Location: [facility];  Service: Cardiovascular;  Laterality: N/A;  . TUBAL LIGATION      Family History  Problem Relation Age of Onset  . Cancer Mother        Uterine cancer  . Diabetes Mother   . Heart disease Mother   . Heart failure Mother   . Parkinson's disease Father   . Cancer Sister        breast cancer  . Diabetes Brother        Non-insulin Dependent Diabetes Mellitus  . Mesothelioma Brother   . Hypertension Sister   . Diabetes Sister        Non-insulin dependent Diabetes Mellitus    Social History:  reports that she has never smoked. She has never used smokeless tobacco. She reports that she does not drink alcohol or use drugs.  She is originally  from [place]  Her husband was born in [place], [place].  They met in middle school in [place].  She has been married to her husband x 52 years.  The patient is accompanied by her husband, [name], today.  Allergies:  Allergies  Allergen Reactions  . Hydrochlorothiazide     Leg Cramps  . Penicillins Rash    Current Medications: Current Outpatient Prescriptions  Medication Sig Dispense Refill  . ADVAIR HFA 115-21 MCG/ACT inhaler Inhale 2 puffs into the lungs as needed.     Marland Kitchen albuterol (PROAIR HFA) 108 (90 Base) MCG/ACT inhaler Inhale 2 puffs into the lungs every 6 (six) hours as needed. 18 Inhaler 3  . amLODipine (NORVASC) 5 MG tablet Take 1 tablet (5 mg total) by mouth daily. 30 tablet 5  . aspirin EC 81 MG tablet Take 1 tablet (81 mg total) by mouth daily. 90 tablet 3  . fluticasone furoate-vilanterol (BREO ELLIPTA) 200-25 MCG/INH AEPB Inhale 1 puff into the lungs daily. FOR ASTHMA J45.20 1 each 5  . levalbuterol (XOPENEX) 1.25 MG/3ML nebulizer solution Take 1.25 mg by nebulization every 8 (eight) hours as needed for wheezing. 72 mL 3  . lovastatin (MEVACOR) 20 MG tablet Take 1 tablet (20 mg total) by mouth at bedtime. 90 tablet 4  . meloxicam (MOBIC)  15 MG tablet Take 1 tablet (15 mg total) by mouth daily as needed for pain. Take with food 90 tablet 4  . metFORMIN (GLUCOPHAGE-XR) 750 MG 24 hr tablet Take 2 tablets by mouth daily.  4  . metoprolol (LOPRESSOR) 50 MG tablet Take 1 tablet (50 mg total) by mouth 2 (two) times daily. 180 tablet 3  . montelukast (SINGULAIR) 10 MG tablet Take 10 mg by mouth as needed.  4  . potassium chloride (K-DUR) 10 MEQ tablet Take 1 tablet (10 mEq total) by mouth daily. 30 tablet 3  . sacubitril-valsartan (ENTRESTO) 49-51 MG Take 1 tablet by mouth 2 (two) times daily. 60 tablet 3  . silver sulfADIAZINE (SILVADENE) 1 % cream Apply 1 application topically daily. 85 g 0  . spironolactone (ALDACTONE) 25 MG tablet TAKE 1 TABLET (25 MG TOTAL) BY  MOUTH DAILY. 30 tablet 12  . traZODone (DESYREL) 100 MG tablet TAKE 1 TABLET BY MOUTH EVERY NIGHT AT BEDTIME AS NEEDED FOR INSOMNIA 90 tablet 4  . furosemide (LASIX) 40 MG tablet Take 1 tablet (40 mg total) by mouth daily. 90 tablet 3  . HYDROcodone-acetaminophen (NORCO) 7.5-325 MG tablet Take 1 tablet by mouth every 6 (six) hours as needed for moderate pain. 60 tablet 0   No current facility-administered medications for this visit.     Review of Systems:  GENERAL:  Feels "ducky".  No fevers or sweats.  Weight stable between 191 -193 pounds.  Weight up 4 pounds since last visit. PERFORMANCE STATUS (ECOG):  1 HEENT:  Chronic vision changes.  No visual changes, runny nose, sore throat, mouth sores or tenderness. Lungs:  Shortness of breath.  No cough.  No hemoptysis. Cardiac:  No chest pain, palpitations, orthopnea, or PND. GI:  No nausea, vomiting, diarrhea, constipation, melena or hematochezia.  Colonoscopy up-to-date. GU:  No urgency, dysuria, or hematuria. Musculoskeletal:  Lower back pain.  No joint pain.  No muscle tenderness. Extremities:  Lower extremity swelling, improved.  Legs and feet hurt.   Skin:  Right leg weeping.  No other rashes or skin changes. Neuro:  No headache, numbness or weakness, balance or coordination issues. Endocrine:  Diabetes.  No thyroid issues, hot flashes or night sweats. Psych:  Anxiety.  No mood changes, depression. Pain:  Lower back pain.  Review of systems:  All other systems reviewed and found to be negative.  Physical Exam: Blood pressure 100/61, pulse 62, temperature 97.8 F (36.6 C), temperature source Tympanic, weight 196 lb (88.9 kg). GENERAL:  Fatigued appearing woman sitting in a wheelchair in the exam room in no acute distress.  MENTAL STATUS:  Alert and oriented to person, place and time. HEAD:Grayingblonde/brown hair. Normocephalic, atraumatic, face symmetric, no Cushingoid  EYES:  Glasses.  Blue eyes.  Pupils equal round and reactive  to light and accomodation.  No conjunctivitis or scleral icterus. RFX:JOITGPQDIY clear without lesion. Tonguenormal. Mucous membranes moist. RESPIRATORY:Clear to auscultationwithout rales, wheezes or rhonchi. CARDIOVASCULAR:Regular rate andrhythmwithout murmur, rub or gallop. ABDOMEN:Fully round.  Soft, non-tender, with active bowel sounds, and no hepatosplenomegaly. No masses. SKIN: Lower extremities wrapped. EXTREMITIES:  Chronic lower extremity edema.  No palpable cords. NEUROLOGICAL: Unremarkable. PSYCH:  Appropriate   Appointment on 05/16/2016  Component Date Value Ref Range Status  . Sodium 05/16/2016 134* 135 - 145 mmol/L Final  . Potassium 05/16/2016 4.4  3.5 - 5.1 mmol/L Final  . Chloride 05/16/2016 103  101 - 111 mmol/L Final  . CO2 05/16/2016 28  22 - 32 mmol/L Final  .  Glucose, Bld 05/16/2016 192* 65 - 99 mg/dL Final  . BUN 05/16/2016 35* 6 - 20 mg/dL Final  . Creatinine, Ser 05/16/2016 1.13* 0.44 - 1.00 mg/dL Final  . Calcium 05/16/2016 10.3  8.9 - 10.3 mg/dL Final  . Total Protein 05/16/2016 8.7* 6.5 - 8.1 g/dL Final  . Albumin 05/16/2016 3.4* 3.5 - 5.0 g/dL Final  . AST 05/16/2016 19  15 - 41 U/L Final  . ALT 05/16/2016 12* 14 - 54 U/L Final  . Alkaline Phosphatase 05/16/2016 74  38 - 126 U/L Final  . Total Bilirubin 05/16/2016 0.4  0.3 - 1.2 mg/dL Final  . GFR calc non Af Amer 05/16/2016 47* >60 mL/min Final  . GFR calc Af Amer 05/16/2016 55* >60 mL/min Final   Comment: (NOTE) The eGFR has been calculated using the CKD EPI equation. This calculation has not been validated in all clinical situations. eGFR's persistently <60 mL/min signify possible Chronic Kidney Disease.   . Anion gap 05/16/2016 3* 5 - 15 Final  . WBC 05/16/2016 5.2  3.6 - 11.0 K/uL Final  . RBC 05/16/2016 3.38* 3.80 - 5.20 MIL/uL Final  . Hemoglobin 05/16/2016 9.8* 12.0 - 16.0 g/dL Final  . HCT 05/16/2016 29.0* 35.0 - 47.0 % Final  . MCV 05/16/2016 85.6  80.0 - 100.0 fL Final  .  MCH 05/16/2016 28.9  26.0 - 34.0 pg Final  . MCHC 05/16/2016 33.8  32.0 - 36.0 g/dL Final  . RDW 05/16/2016 17.9* 11.5 - 14.5 % Final  . Platelets 05/16/2016 253  150 - 440 K/uL Final  . Neutrophils Relative % 05/16/2016 57  % Final  . Neutro Abs 05/16/2016 3.0  1.4 - 6.5 K/uL Final  . Lymphocytes Relative 05/16/2016 29  % Final  . Lymphs Abs 05/16/2016 1.5  1.0 - 3.6 K/uL Final  . Monocytes Relative 05/16/2016 10  % Final  . Monocytes Absolute 05/16/2016 0.5  0.2 - 0.9 K/uL Final  . Eosinophils Relative 05/16/2016 3  % Final  . Eosinophils Absolute 05/16/2016 0.2  0 - 0.7 K/uL Final  . Basophils Relative 05/16/2016 1  % Final  . Basophils Absolute 05/16/2016 0.0  0 - 0.1 K/uL Final  . Total Protein ELP 05/16/2016 8.2  6.0 - 8.5 g/dL Final  . Albumin ELP 05/16/2016 3.3  2.9 - 4.4 g/dL Final  . Alpha-1-Globulin 05/16/2016 0.2  0.0 - 0.4 g/dL Final  . Alpha-2-Globulin 05/16/2016 0.8  0.4 - 1.0 g/dL Final  . Beta Globulin 05/16/2016 1.0  0.7 - 1.3 g/dL Final  . Gamma Globulin 05/16/2016 2.9* 0.4 - 1.8 g/dL Final  . M-Spike, % 05/16/2016 0.7* Not Observed g/dL Final  . SPE Interp. 05/16/2016 Comment   Final   Comment: (NOTE) The SPE pattern demonstrates a single peak (M-spike) in the gamma region which may represent monoclonal protein. This peak may also be caused by circulating immune complexes, cryoglobulins, C-reactive protein, fibrinogen or hemolysis.  If clinically indicated, the presence of a monoclonal gammopathy may be confirmed by immuno- fixation, as well as an evaluation of the urine for the presence of Bence-Jones protein. Performed At: [facility] [place], [place] [id number] Lindon Romp MD ER:[id number]   . Comment 05/16/2016 Comment   Final   Comment: (NOTE) Protein electrophoresis scan will follow via computer, mail, or courier delivery.   Marland Kitchen GLOBULIN, TOTAL 05/16/2016 4.9* 2.2 - 3.9 g/dL Corrected  . A/G Ratio 05/16/2016 0.7  0.7 - 1.7  Corrected  . Blood Bank  Specimen 05/16/2016 SAMPLE AVAILABLE FOR TESTING   Final  . Sample Expiration 05/16/2016 05/19/2016   Final    Assessment:  MUMTAZ LOVINS is a 71 y.o. female with stage II myeloma.  SPEP on 01/25/2016 revealed a 0.9 gm/dL IgG monoclonal gammopathy.  She denied any bone pain or recurrent infections.  Calcium and creatinine were normal.  Urinalysis revealed 30+ protein.  Work-up on 02/14/2016 revealed a hematocrit of 29.1, hemoglobin 9.4, MCV 84.6, platelets 225,000, white count 5700 with an Century of 3700.  SPEP revealed a 1.9 g/dL monoclonal spike.  Kappa free light chains were 237.2, lambda free light chains 38.7 with a ratio of 6.13 (0.26-1.65).  Beta 2-microglobulin was 3.9 (0.6-2.4).  BMP revealed a creatinine of 0.95 and calcium 9.0.  24 hour UPEP revealed 31 mg/24 hours of an IgG monoclonal protein with kappa light chain specificity.  Bence Jones protein was positive, kappa type.  Bone marrow on 02/29/2016 revealed a hypercellular marrow for age with plasmacytosis (plasma cells 10%).  There was trilineage hematopoiesis. Immunohistochemical stains highlighted the plasma cell component which generally showed a polyclonal staining pattern for kappa and lambda light chain although there was kappa light chain excess especially in several plasma cell clusters. The features were most suggestive of early plasma cell dyscrasia/neoplasm.  Cytogenetics were normal (28, XX).  Bone survey on 03/22/2016 revealed no focal lesion or acute bony abnormality.  PET scan on 03/31/2016 revealed no significant abnormal hypermetabolic activity is identified in the head, neck, chest, abdomen, pelvis, or skeleton.  SPEP has been followed: 0.9 gm/dL on 01/25/2016, 1.9 on 02/14/2016, 1.7 on 03/24/2016, and 0.7 on 05/16/2016.   She has a normocytic anemia.  Work-up on 01/25/2016 and 02/14/2016 revealed a normal ferritin (90), iron studies, folate, and thyroid function tests.  B12 was 312 (low normal)  on 01/25/2016, but with a normal MMA on 02/14/2016 thus ruling out B12 deficiency.  Reticulocyte count is inappropiately low.  Last colonoscopy was 2 years ago.  Guaiac cards were negative x 3 in 04/2016.  Cardiac catheterization on 03/07/2016 revealed non-obstructive coronary artery disease.  She has severe pulmonary artery hypertension with moderate to severely elevated right heart filling pressures and moderately elevated left heart filling pressures.   Symptomatically, she denies any new complaints.  She has slowly healing lower leg wounds.  Creatinine is 1.13 (stable).  Hemoglobin continues to improve (8.1 to 9.8).  Plan: 1.  Labs today:  CBC with diff, CMP, SPEP, hold tube. 2.  Review plan to continue observation without treatment of myeloma as clinical and laboratory improvement. 3.  RTC in 4 weeks for labs (CBC with diff, hold tube) 4.  RTC in 8 weeks for MD assess and labs (CBC with diff, CMP, SPEP).   Lequita Asal, MD  05/16/2016, 2:14 PM

## 2016-05-18 ENCOUNTER — Ambulatory Visit (INDEPENDENT_AMBULATORY_CARE_PROVIDER_SITE_OTHER): Payer: Medicare Other | Admitting: Family Medicine

## 2016-05-18 ENCOUNTER — Encounter: Payer: Self-pay | Admitting: Family Medicine

## 2016-05-18 VITALS — BP 126/62 | HR 61 | Temp 97.5°F | Resp 18

## 2016-05-18 DIAGNOSIS — I5022 Chronic systolic (congestive) heart failure: Secondary | ICD-10-CM

## 2016-05-18 DIAGNOSIS — R609 Edema, unspecified: Secondary | ICD-10-CM | POA: Diagnosis not present

## 2016-05-18 DIAGNOSIS — E1121 Type 2 diabetes mellitus with diabetic nephropathy: Secondary | ICD-10-CM

## 2016-05-18 LAB — PROTEIN ELECTROPHORESIS, SERUM
A/G Ratio: 0.7 (ref 0.7–1.7)
Albumin ELP: 3.3 g/dL (ref 2.9–4.4)
Alpha-1-Globulin: 0.2 g/dL (ref 0.0–0.4)
Alpha-2-Globulin: 0.8 g/dL (ref 0.4–1.0)
Beta Globulin: 1 g/dL (ref 0.7–1.3)
Gamma Globulin: 2.9 g/dL — ABNORMAL HIGH (ref 0.4–1.8)
Globulin, Total: 4.9 g/dL — ABNORMAL HIGH (ref 2.2–3.9)
M-Spike, %: 0.7 g/dL — ABNORMAL HIGH
Total Protein ELP: 8.2 g/dL (ref 6.0–8.5)

## 2016-05-18 LAB — POCT GLYCOSYLATED HEMOGLOBIN (HGB A1C)
Est. average glucose Bld gHb Est-mCnc: 171
Hemoglobin A1C: 7.6

## 2016-05-18 NOTE — Progress Notes (Signed)
Patient: [name] Female    DOB: August 25, 1945   71 y.o.   MRN: [id number] Visit Date: 05/18/2016  Today's Provider: Lelon Huh, MD   Chief Complaint  Patient presents with  . Follow-up   Subjective:    HPI Follow up of skin ulcer:  Patient was last seen for this problem 4 weeks ago. Management during that visit includes recommending referral to wound care clinic; patient declined. Patient was counseled on wound care. She was also advised to apply Silvadene cream and keep sore covered with a dry dressing at all other times. Today patient comes in reporting good compliance with treatment, good tolerance and good symptom control. Patient states she still has some redness and swelling of the legs.  .Follow up diabetes: Last A1c in January was 6.5. No change was made to regiment at that time. She has not been well since that time with CHF and MM evaluation. Has just been able to start walking more the last few weeks. Has cut back on furosemide to 20mg  a day since being on Entresto and quality of life has greatly improved since she is not having to urinate as frequently.     Allergies  Allergen Reactions  . Hydrochlorothiazide     Leg Cramps  . Penicillins Rash     Current Outpatient Prescriptions:  .  ADVAIR HFA 115-21 MCG/ACT inhaler, Inhale 2 puffs into the lungs as needed. , Disp: , Rfl:  .  albuterol (PROAIR HFA) 108 (90 Base) MCG/ACT inhaler, Inhale 2 puffs into the lungs every 6 (six) hours as needed., Disp: 18 Inhaler, Rfl: 3 .  amLODipine (NORVASC) 5 MG tablet, Take 1 tablet (5 mg total) by mouth daily., Disp: 30 tablet, Rfl: 5 .  aspirin EC 81 MG tablet, Take 1 tablet (81 mg total) by mouth daily., Disp: 90 tablet, Rfl: 3 .  fluticasone furoate-vilanterol (BREO ELLIPTA) 200-25 MCG/INH AEPB, Inhale 1 puff into the lungs daily. FOR ASTHMA J45.20, Disp: 1 each, Rfl: 5 .  furosemide (LASIX) 20 MG tablet, Take 1 tablet (20 mg total) by mouth daily., Disp: 30 tablet,  Rfl: 3 .  HYDROcodone-acetaminophen (NORCO) 7.5-325 MG tablet, Take 1 tablet by mouth every 6 (six) hours as needed for moderate pain., Disp: 60 tablet, Rfl: 0 .  levalbuterol (XOPENEX) 1.25 MG/3ML nebulizer solution, Take 1.25 mg by nebulization every 8 (eight) hours as needed for wheezing., Disp: 72 mL, Rfl: 3 .  lovastatin (MEVACOR) 20 MG tablet, Take 1 tablet (20 mg total) by mouth at bedtime., Disp: 90 tablet, Rfl: 4 .  meloxicam (MOBIC) 15 MG tablet, Take 1 tablet (15 mg total) by mouth daily as needed for pain. Take with food, Disp: 90 tablet, Rfl: 4 .  metFORMIN (GLUCOPHAGE-XR) 750 MG 24 hr tablet, Take 2 tablets by mouth daily., Disp: , Rfl: 4 .  metoprolol (LOPRESSOR) 50 MG tablet, Take 1 tablet (50 mg total) by mouth 2 (two) times daily., Disp: 180 tablet, Rfl: 3 .  montelukast (SINGULAIR) 10 MG tablet, Take 10 mg by mouth as needed., Disp: , Rfl: 4 .  potassium chloride (K-DUR) 10 MEQ tablet, Take 1 tablet (10 mEq total) by mouth daily., Disp: 30 tablet, Rfl: 3 .  sacubitril-valsartan (ENTRESTO) 49-51 MG, Take 1 tablet by mouth 2 (two) times daily., Disp: 60 tablet, Rfl: 3 .  silver sulfADIAZINE (SILVADENE) 1 % cream, Apply 1 application topically daily., Disp: 85 g, Rfl: 0 .  spironolactone (ALDACTONE) 25 MG tablet, TAKE  1 TABLET (25 MG TOTAL) BY MOUTH DAILY., Disp: 30 tablet, Rfl: 12 .  traZODone (DESYREL) 100 MG tablet, TAKE 1 TABLET BY MOUTH EVERY NIGHT AT BEDTIME AS NEEDED FOR INSOMNIA, Disp: 90 tablet, Rfl: 4  Review of Systems  Constitutional: Negative for appetite change, chills, fatigue and fever.  Respiratory: Negative for chest tightness and shortness of breath.   Cardiovascular: Positive for leg swelling. Negative for chest pain and palpitations.  Gastrointestinal: Negative for abdominal pain, nausea and vomiting.  Skin: Positive for color change.       Redness in legs  Neurological: Negative for dizziness and weakness.    Social History  Substance Use Topics  .  Smoking status: Never Smoker  . Smokeless tobacco: Never Used  . Alcohol use No     Comment: Occasional alcohol use   Objective:   BP 126/62 (BP Location: Left Arm, Patient Position: Sitting, Cuff Size: Large)   Pulse 61   Temp 97.5 F (36.4 C) (Oral)   Resp 18   SpO2 98% Comment: room air There were no vitals filed for this visit.   Physical Exam  Healing sore on right anterior leg greatly improved from last visit. Small erosion near main wound of right leg and very small sore on left anterior leg.  1+ bilateral foot and ankle edema.   Results for orders placed or performed in visit on 05/18/16  POCT HgB A1C  Result Value Ref Range   Hemoglobin A1C 7.6    Est. average glucose Bld gHb Est-mCnc 171        Assessment & Plan:     1. Diabetes mellitus with nephropathy ([place]) A1c up from January. Encouraged to increase physical activity as tolerated and be more strict with low sugar diet. Continue current medications.   - POCT HgB A1C  2. Edema, unspecified type Improved since starting Entresto. She is still very aggravated with urinary frequency and advised she could try reducing furosemide to QOD so long as she has no DOE and sores on legs continue to steadily improve. She is to continue with silvadene cream on any sore that have not healed.   3. Chronic systolic heart failure ([place]) Steadily improving. Continue Entresto per cardiology. Continue spironolactone and betablocker. Followw Dr. Saunders Revel as scheduled.   Return in about 6 weeks (around 06/29/2016).       Lelon Huh, MD  [facility]

## 2016-06-12 DIAGNOSIS — E113413 Type 2 diabetes mellitus with severe nonproliferative diabetic retinopathy with macular edema, bilateral: Secondary | ICD-10-CM | POA: Diagnosis not present

## 2016-06-13 ENCOUNTER — Other Ambulatory Visit: Payer: Self-pay

## 2016-06-13 ENCOUNTER — Inpatient Hospital Stay: Payer: Medicare Other | Attending: Hematology and Oncology

## 2016-06-13 DIAGNOSIS — C9000 Multiple myeloma not having achieved remission: Secondary | ICD-10-CM | POA: Diagnosis not present

## 2016-06-13 LAB — CBC WITH DIFFERENTIAL/PLATELET
Basophils Absolute: 0 10*3/uL (ref 0–0.1)
Basophils Relative: 1 %
Eosinophils Absolute: 0.2 10*3/uL (ref 0–0.7)
Eosinophils Relative: 3 %
HCT: 30 % — ABNORMAL LOW (ref 35.0–47.0)
Hemoglobin: 10.1 g/dL — ABNORMAL LOW (ref 12.0–16.0)
Lymphocytes Relative: 42 %
Lymphs Abs: 2.4 10*3/uL (ref 1.0–3.6)
MCH: 29.1 pg (ref 26.0–34.0)
MCHC: 33.6 g/dL (ref 32.0–36.0)
MCV: 86.5 fL (ref 80.0–100.0)
Monocytes Absolute: 0.5 10*3/uL (ref 0.2–0.9)
Monocytes Relative: 10 %
Neutro Abs: 2.5 10*3/uL (ref 1.4–6.5)
Neutrophils Relative %: 44 %
Platelets: 232 10*3/uL (ref 150–440)
RBC: 3.47 MIL/uL — ABNORMAL LOW (ref 3.80–5.20)
RDW: 16.2 % — ABNORMAL HIGH (ref 11.5–14.5)
WBC: 5.7 10*3/uL (ref 3.6–11.0)

## 2016-06-13 LAB — SAMPLE TO BLOOD BANK

## 2016-06-14 ENCOUNTER — Encounter: Payer: Self-pay | Admitting: Internal Medicine

## 2016-06-14 ENCOUNTER — Ambulatory Visit (INDEPENDENT_AMBULATORY_CARE_PROVIDER_SITE_OTHER): Payer: Medicare Other | Admitting: Internal Medicine

## 2016-06-14 VITALS — BP 120/60 | HR 61 | Ht 61.0 in | Wt 197.0 lb

## 2016-06-14 DIAGNOSIS — I428 Other cardiomyopathies: Secondary | ICD-10-CM

## 2016-06-14 DIAGNOSIS — I10 Essential (primary) hypertension: Secondary | ICD-10-CM

## 2016-06-14 DIAGNOSIS — S81809A Unspecified open wound, unspecified lower leg, initial encounter: Secondary | ICD-10-CM | POA: Diagnosis not present

## 2016-06-14 DIAGNOSIS — I2510 Atherosclerotic heart disease of native coronary artery without angina pectoris: Secondary | ICD-10-CM

## 2016-06-14 DIAGNOSIS — I5042 Chronic combined systolic (congestive) and diastolic (congestive) heart failure: Secondary | ICD-10-CM

## 2016-06-14 DIAGNOSIS — R600 Localized edema: Secondary | ICD-10-CM

## 2016-06-14 MED ORDER — FUROSEMIDE 40 MG PO TABS: 40.0000 mg | ORAL_TABLET | Freq: Every day | ORAL | 3 refills | Status: DC

## 2016-06-14 NOTE — Patient Instructions (Signed)
Medication Instructions:  Your physician has recommended you make the following change in your medication:  1- INCREASE Furosemide to 40 mg by mouth once a day.   Labwork: Your physician recommends that you return for lab work at your [facility], PLEASE. (BMP).   Testing/Procedures: Your physician has requested that you have an echocardiogram. Echocardiography is a painless test that uses sound waves to create images of your heart. It provides your doctor with information about the size and shape of your heart and how well your heart's chambers and valves are working. This procedure takes approximately one hour. There are no restrictions for this procedure.    Follow-Up: Your physician recommends that you schedule a follow-up appointment in: [place].   If you need a refill on your cardiac medications before your next appointment, please call your pharmacy.  Echocardiogram An echocardiogram, or echocardiography, uses sound waves (ultrasound) to produce an image of your heart. The echocardiogram is simple, painless, obtained within a short period of time, and offers valuable information to your health care provider. The images from an echocardiogram can provide information such as:  Evidence of coronary artery disease (CAD).  Heart size.  Heart muscle function.  Heart valve function.  Aneurysm detection.  Evidence of a past heart attack.  Fluid buildup around the heart.  Heart muscle thickening.  Assess heart valve function.  Tell a health care provider about:  Any allergies you have.  All medicines you are taking, including vitamins, herbs, eye drops, creams, and over-the-counter medicines.  Any problems you or family members have had with anesthetic medicines.  Any blood disorders you have.  Any surgeries you have had.  Any medical conditions you have.  Whether you are pregnant or may be pregnant. What happens before the  procedure? No special preparation is needed. Eat and drink normally. What happens during the procedure?  In order to produce an image of your heart, gel will be applied to your chest and a wand-like tool (transducer) will be moved over your chest. The gel will help transmit the sound waves from the transducer. The sound waves will harmlessly bounce off your heart to allow the heart images to be captured in real-time motion. These images will then be recorded.  You may need an IV to receive a medicine that improves the quality of the pictures. What happens after the procedure? You may return to your normal schedule including diet, activities, and medicines, unless your health care provider tells you otherwise. This information is not intended to replace advice given to you by your health care provider. Make sure you discuss any questions you have with your health care provider. Document Released: 12/17/1999 Document Revised: 08/07/2015 Document Reviewed: 08/26/2012 Elsevier Interactive Patient Education  2017 [employer].

## 2016-06-14 NOTE — Progress Notes (Signed)
Follow-up Outpatient Visit Date: 06/14/2016  Primary Care Provider: Birdie Sons, MD [address] [place] [address]  Chief Complaint: Follow-up heart failure  HPI:  [name] is a 71 y.o. year-old female with history of non-obstructive coronary artery disease, non-ischemic cardiomyopathy with chronic systolic and diastolic heart failure, hypertension, diabetes mellitus, and multiple myeloma, who presents for follow-up of heart failure. She was previously followed in our office by Dr. Yvone Neu, having last been seen on 05/03/16. Today, she reports feeling well. She denies chest pain and states that her breathing is "the best it has been in 70 years." She continues to have chronic leg edema with occasional weeping of clear fluid. She does not see a wound specialist. She does not wear compression stockings or elevate her legs because it is painful. She was on an increased dose of furosemide in the past, though this has been decreased due to increased urinary frequency after taking the medication. She reports mild fluctuations in her weight, noting that it is typically 192-197 pounds. She has not had any orthopnea, PND, palpitations, or lightheadedness. She is sedentary and does not walk on a regular basis. She uses a walker for balance.Marland Kitchen  --------------------------------------------------------------------------------------------------  Cardiovascular History & Procedures: Cardiovascular Problems:  Chronic systolic and diastolic heart failure secondary to non-ischemic cardiomyopathy  Nonobstructive coronary artery disease  Moderate aortic stenosis  Risk Factors:  Known coronary artery disease, hypertension, hyperlipidemia, diabetes mellitus, sedentary lifestyle, obesity, and age > 49  Cath/PCI:  LHC/RHC (03/07/16): LMCA normal. LAD with 30% ostial stenosi, 30$ midvessel narrowing, and 50% distal disease. LCx with 40% midvessel lesion. RCA with 30% proximal narrowing. LVEDP  26 mmHg. RA 18 (prominent "M" appearance), RV 82/21, PA mean 55, PCWP 25. Ao sat 96%, PA sat 54%, Fick Co/Ci 3.2/1.7. PVR 9.4 Wood units. No significant AoV gradient.  CV Surgery:  None  EP Procedures and Devices:  None  Non-Invasive Evaluation(s):  TTE (02/25/16): Normal LV size with moderate LVH. LVEF 25-40% with grade 2 diastolic dysfunction. Thickened aortic valve with likely moderate stenosis. MAC with mild MR. Mild LA enlargement. Mildly to moderately reduced RV contraction. Mild-moderate TR. Moderately PH (RVSP 55 mmHg).  Recent CV Pertinent Labs: Lab Results  Component Value Date   CHOL 139 06/25/2015   HDL 59 06/25/2015   LDLCALC 67 06/25/2015   TRIG 64 06/25/2015   CHOLHDL 2.4 06/25/2015   INR 1.0 03/02/2016   BNP 1,592.0 (H) 02/09/2016   K 4.4 05/16/2016   BUN 35 (H) 05/16/2016   BUN 33 (H) 05/03/2016   CREATININE 1.13 (H) 05/16/2016    Past medical and surgical history were reviewed and updated in EPIC.  Outpatient Encounter Prescriptions as of 06/14/2016  Medication Sig  . ADVAIR HFA 115-21 MCG/ACT inhaler Inhale 2 puffs into the lungs as needed.   Marland Kitchen albuterol (PROAIR HFA) 108 (90 Base) MCG/ACT inhaler Inhale 2 puffs into the lungs every 6 (six) hours as needed.  Marland Kitchen amLODipine (NORVASC) 5 MG tablet Take 1 tablet (5 mg total) by mouth daily.  Marland Kitchen aspirin EC 81 MG tablet Take 1 tablet (81 mg total) by mouth daily.  . fluticasone furoate-vilanterol (BREO ELLIPTA) 200-25 MCG/INH AEPB Inhale 1 puff into the lungs daily. FOR ASTHMA J45.20  . furosemide (LASIX) 20 MG tablet Take 1 tablet (20 mg total) by mouth daily.  Marland Kitchen HYDROcodone-acetaminophen (NORCO) 7.5-325 MG tablet Take 1 tablet by mouth every 6 (six) hours as needed for moderate pain.  Marland Kitchen levalbuterol (XOPENEX) 1.25  MG/3ML nebulizer solution Take 1.25 mg by nebulization every 8 (eight) hours as needed for wheezing.  . lovastatin (MEVACOR) 20 MG tablet Take 1 tablet (20 mg total) by mouth at bedtime.  . meloxicam  (MOBIC) 15 MG tablet Take 1 tablet (15 mg total) by mouth daily as needed for pain. Take with food  . metFORMIN (GLUCOPHAGE-XR) 750 MG 24 hr tablet Take 2 tablets by mouth daily.  . metoprolol (LOPRESSOR) 50 MG tablet Take 1 tablet (50 mg total) by mouth 2 (two) times daily.  . montelukast (SINGULAIR) 10 MG tablet Take 10 mg by mouth as needed.  . potassium chloride (K-DUR) 10 MEQ tablet Take 1 tablet (10 mEq total) by mouth daily.  . sacubitril-valsartan (ENTRESTO) 49-51 MG Take 1 tablet by mouth 2 (two) times daily.  . silver sulfADIAZINE (SILVADENE) 1 % cream Apply 1 application topically daily.  Marland Kitchen spironolactone (ALDACTONE) 25 MG tablet TAKE 1 TABLET (25 MG TOTAL) BY MOUTH DAILY.  . traZODone (DESYREL) 100 MG tablet TAKE 1 TABLET BY MOUTH EVERY NIGHT AT BEDTIME AS NEEDED FOR INSOMNIA   No facility-administered encounter medications on file as of 06/14/2016.     Allergies: Hydrochlorothiazide and Penicillins  Social History   Social History  . Marital status: Married    Spouse name: N/A  . Number of children: N/A  . Years of education: N/A   Occupational History  . Not on file.   Social History Main Topics  . Smoking status: Never Smoker  . Smokeless tobacco: Never Used  . Alcohol use No     Comment: Occasional alcohol use  . Drug use: No  . Sexual activity: Not on file   Other Topics Concern  . Not on file   Social History Narrative  . No narrative on file    Family History  Problem Relation Age of Onset  . Cancer Mother        Uterine cancer  . Diabetes Mother   . Heart disease Mother   . Heart failure Mother   . Parkinson's disease Father   . Cancer Sister        breast cancer  . Diabetes Brother        Non-insulin Dependent Diabetes Mellitus  . Mesothelioma Brother   . Hypertension Sister   . Diabetes Sister        Non-insulin dependent Diabetes Mellitus    Review of Systems: A 12-system review of systems was performed and was negative except as noted  in the HPI.  --------------------------------------------------------------------------------------------------  Physical Exam: BP 120/60 (BP Location: Left Arm, Patient Position: Sitting, Cuff Size: Normal)   Pulse 61   Ht _0  (1.549 m)   Wt 197 lb (89.4 kg)   BMI 37.22 kg/m   General:  Obese woman, seated comfortably in a wheelchair. She is accompanied by her husband and gradnson. HEENT: No conjunctival pallor or scleral icterus.  Moist mucous membranes.  OP clear. Neck: Supple without lymphadenopathy, thyromegaly. No gross JVD, though evaluation is limited by body habitus. Lungs: Normal work of breathing.  Clear to auscultation bilaterally without wheezes or crackles. Heart: Regular rate and rhythm with 2/6 mid-peaking crescendo-decrescendo systolic murmur loudest at the RUSB. No rubs or gallops. Unable to assess PMI due to body habitus. Abd: Bowel sounds present.  Soft, NT/ND. Unable to assess HSM due to body habitus. Ext: 2+ pretibial edema bilaterally with chronic skin changes and small superficial ulcerations with scant clear drainage. Left DP is 2+, right DP is 1+.  PT pulses not palpable bilaterally. Skin: warm and dry. Skin changes involving BLE, as above.  EKG:  Normal sinus rhythm without abnormalities.  Lab Results  Component Value Date   WBC 5.7 06/13/2016   HGB 10.1 (L) 06/13/2016   HCT 30.0 (L) 06/13/2016   MCV 86.5 06/13/2016   PLT 232 06/13/2016    Lab Results  Component Value Date   NA 134 (L) 05/16/2016   K 4.4 05/16/2016   CL 103 05/16/2016   CO2 28 05/16/2016   BUN 35 (H) 05/16/2016   CREATININE 1.13 (H) 05/16/2016   GLUCOSE 192 (H) 05/16/2016   ALT 12 (L) 05/16/2016    Lab Results  Component Value Date   CHOL 139 06/25/2015   HDL 59 06/25/2015   LDLCALC 67 06/25/2015   TRIG 64 06/25/2015   CHOLHDL 2.4 06/25/2015    --------------------------------------------------------------------------------------------------  ASSESSMENT AND  PLAN: Chronic systolic and diastolic heart failure Patient continues to have leg edema. Assessment of volume status and functional capacity is challenging due to body habitus and limited mobility. Her weight is up ~5 pounds since late April/early May. We have agreed to increase furosemide to 40 mg daily. I will have ms. She is scheduled to see Dr. Caryn Section for f/u on 6/21; I recommend that a BMP be checked at that time to ensure stable renal function and electrolytes. We will continue current doses of metoprolol, spironolactone, and Entresto. Given that she has been on evidence-based heart failure medications for over 3 months since her last echo, we will repeat a TTE to evaluate for interval improvement.  Coronary artery disease without angina Ms. Decaire is doing well without CP or shortness of breath. We will continue current medication to prevent progression of CAD. We could consider escalating statin to a high-intensity agent in the future, though her LDL was at goal when last checked about a year ago. Repeat lipid panel should be considered; I will defer this to Dr. Caryn Section.  Hypertension Blood pressure is well controlled today. We will increase furosemide, as above. No other mediation changes will be made today.  Bilateral lower extremity edema and wounds I suspect these findings are due to a combination of heart failure and venous insufficiency. We will increase furosemide, as above. I have again encouraged the patient to elevate her legs. In light of her diminished pedal pulses and risk factors for PVD, we will obtain ABI's. Referral to the wound clinic may be helpful if her leg ulcers do not improve. I will defer this to Dr. Caryn Section.  Follow-up: Return to clinic in 6 weeks.  Nelva Bush, MD 06/14/2016 10:33 AM

## 2016-06-15 ENCOUNTER — Other Ambulatory Visit: Payer: Self-pay | Admitting: *Deleted

## 2016-06-15 DIAGNOSIS — R0989 Other specified symptoms and signs involving the circulatory and respiratory systems: Secondary | ICD-10-CM

## 2016-06-22 ENCOUNTER — Ambulatory Visit (INDEPENDENT_AMBULATORY_CARE_PROVIDER_SITE_OTHER): Payer: Medicare Other | Admitting: Family Medicine

## 2016-06-22 ENCOUNTER — Encounter: Payer: Self-pay | Admitting: Family Medicine

## 2016-06-22 VITALS — BP 110/60 | HR 63 | Temp 97.9°F | Resp 18 | Wt 196.0 lb

## 2016-06-22 DIAGNOSIS — L98491 Non-pressure chronic ulcer of skin of other sites limited to breakdown of skin: Secondary | ICD-10-CM | POA: Diagnosis not present

## 2016-06-22 DIAGNOSIS — M545 Low back pain, unspecified: Secondary | ICD-10-CM

## 2016-06-22 DIAGNOSIS — R32 Unspecified urinary incontinence: Secondary | ICD-10-CM | POA: Diagnosis not present

## 2016-06-22 DIAGNOSIS — E1121 Type 2 diabetes mellitus with diabetic nephropathy: Secondary | ICD-10-CM

## 2016-06-22 DIAGNOSIS — E7800 Pure hypercholesterolemia, unspecified: Secondary | ICD-10-CM | POA: Diagnosis not present

## 2016-06-22 DIAGNOSIS — R609 Edema, unspecified: Secondary | ICD-10-CM | POA: Diagnosis not present

## 2016-06-22 LAB — POCT UA - MICROALBUMIN: Microalbumin Ur, POC: 50 mg/L

## 2016-06-22 MED ORDER — HYDROCODONE-ACETAMINOPHEN 7.5-325 MG PO TABS: 1.0000 | ORAL_TABLET | Freq: Four times a day (QID) | ORAL | 0 refills | Status: DC | PRN

## 2016-06-22 NOTE — Progress Notes (Signed)
Patient: [name] Female    DOB: January 02, 1946   71 y.o.   MRN: [id number] Visit Date: 06/22/2016  Today's Provider: Lelon Huh, MD   Chief Complaint  Patient presents with  . Edema    6 week follow up   Subjective:    HPI Follow up of Edema:  Patient was last seen for this problem 6 weeks ago. Changes made during that visit includes advising patient to reduce Furosemide to every other day due to urinary frequency which she felt was severely impairing her quality of life. She started having increased swelling within a few days and weight increased to 197 by the time she had follow up with Dr. Saunders Revel a week ago. He had go back up to 40mg  a day and she reports swelling has improved. She is having no trouble breathing.  Patient says that she is having urinary incontinence where the urine comes out whenever she stands up. Patient states most of the time she is unable to make it to the bathroom.  She still has some swelling in both legs and redness in her right leg but continues to improve. She has continued to use Silvadene cream on sores and reports that they are healing. She is scheduled for ABI by Dr. Saunders Revel next month. She tried to keep legs elevated byt states stat they hurt terribly when she does.     Allergies  Allergen Reactions  . Hydrochlorothiazide     Leg Cramps  . Penicillins Rash     Current Outpatient Prescriptions:  .  ADVAIR HFA 115-21 MCG/ACT inhaler, Inhale 2 puffs into the lungs as needed. , Disp: , Rfl:  .  albuterol (PROAIR HFA) 108 (90 Base) MCG/ACT inhaler, Inhale 2 puffs into the lungs every 6 (six) hours as needed., Disp: 18 Inhaler, Rfl: 3 .  amLODipine (NORVASC) 5 MG tablet, Take 1 tablet (5 mg total) by mouth daily., Disp: 30 tablet, Rfl: 5 .  aspirin EC 81 MG tablet, Take 1 tablet (81 mg total) by mouth daily., Disp: 90 tablet, Rfl: 3 .  fluticasone furoate-vilanterol (BREO ELLIPTA) 200-25 MCG/INH AEPB, Inhale 1 puff into the lungs daily. FOR ASTHMA  J45.20, Disp: 1 each, Rfl: 5 .  furosemide (LASIX) 40 MG tablet, Take 1 tablet (40 mg total) by mouth daily., Disp: 90 tablet, Rfl: 3 .  HYDROcodone-acetaminophen (NORCO) 7.5-325 MG tablet, Take 1 tablet by mouth every 6 (six) hours as needed for moderate pain., Disp: 60 tablet, Rfl: 0 .  levalbuterol (XOPENEX) 1.25 MG/3ML nebulizer solution, Take 1.25 mg by nebulization every 8 (eight) hours as needed for wheezing., Disp: 72 mL, Rfl: 3 .  lovastatin (MEVACOR) 20 MG tablet, Take 1 tablet (20 mg total) by mouth at bedtime., Disp: 90 tablet, Rfl: 4 .  meloxicam (MOBIC) 15 MG tablet, Take 1 tablet (15 mg total) by mouth daily as needed for pain. Take with food, Disp: 90 tablet, Rfl: 4 .  metFORMIN (GLUCOPHAGE-XR) 750 MG 24 hr tablet, Take 2 tablets by mouth daily., Disp: , Rfl: 4 .  montelukast (SINGULAIR) 10 MG tablet, Take 10 mg by mouth as needed., Disp: , Rfl: 4 .  potassium chloride (K-DUR) 10 MEQ tablet, Take 1 tablet (10 mEq total) by mouth daily., Disp: 30 tablet, Rfl: 3 .  sacubitril-valsartan (ENTRESTO) 49-51 MG, Take 1 tablet by mouth 2 (two) times daily., Disp: 60 tablet, Rfl: 3 .  silver sulfADIAZINE (SILVADENE) 1 % cream, Apply 1 application topically daily., Disp:  85 g, Rfl: 0 .  spironolactone (ALDACTONE) 25 MG tablet, TAKE 1 TABLET (25 MG TOTAL) BY MOUTH DAILY., Disp: 30 tablet, Rfl: 12 .  traZODone (DESYREL) 100 MG tablet, TAKE 1 TABLET BY MOUTH EVERY NIGHT AT BEDTIME AS NEEDED FOR INSOMNIA, Disp: 90 tablet, Rfl: 4 .  metoprolol (LOPRESSOR) 50 MG tablet, Take 1 tablet (50 mg total) by mouth 2 (two) times daily., Disp: 180 tablet, Rfl: 3  Review of Systems  Constitutional: Negative for appetite change, chills, fatigue and fever.  Respiratory: Negative for chest tightness and shortness of breath.   Cardiovascular: Positive for leg swelling. Negative for chest pain and palpitations.  Gastrointestinal: Negative for abdominal pain, nausea and vomiting.  Skin: Positive for wound.        Redness on lower right leg  Neurological: Negative for dizziness and weakness.    Social History  Substance Use Topics  . Smoking status: Never Smoker  . Smokeless tobacco: Never Used  . Alcohol use No     Comment: Occasional alcohol use   Objective:   BP 110/60 (BP Location: Left Arm, Patient Position: Sitting, Cuff Size: Large)   Pulse 63   Temp 97.9 F (36.6 C) (Oral)   Resp 18   Wt 196 lb (88.9 kg)   SpO2 96% Comment: room air  BMI 37.03 kg/m  There were no vitals filed for this visit.   Physical Exam  Physical Exam  General appearance: alert, well developed, well nourished, cooperative and in no distress Head: Normocephalic, without obvious abnormality, atraumatic Respiratory: Respirations even and unlabored, normal respiratory rate Extremities: No gross deformities 2+ edema Skin: Partially scabbed shallow skin ulcerations of both anterior with no discharge        Assessment & Plan:     1. Edema, unspecified type Improved since she went back up to 40mg  of furosemide. Continue current medications.   - Comprehensive metabolic panel  2. Pure hypercholesterolemia  - Lipid panel  3. Diabetes mellitus with nephropathy ([place]) Well controlled - POCT UA - Microalbumin  4. Urinary incontinence, unspecified type Secondary to diuretics.   5. Low back pain without sciatica, unspecified back pain laterality, unspecified chronicity Takes occasional hydrocodone/apap which is effective and well tolerated.  - HYDROcodone-acetaminophen (NORCO) 7.5-325 MG tablet; Take 1 tablet by mouth every 6 (six) hours as needed for moderate pain.  Dispense: 60 tablet; Refill: 0  6. Skin ulcer, limited to breakdown of skin ([place]) Slowly improving, continue daily antibiotic dressing changes. She is still opposed to going to wound clinic as she has a hard time making it to all of her Drs. Appointments already.   Return in about 3 months (around 09/22/2016).        Lelon Huh, MD    [facility]

## 2016-06-23 LAB — COMPREHENSIVE METABOLIC PANEL
A/G RATIO: 0.8 — AB (ref 1.2–2.2)
ALT: 11 IU/L (ref 0–32)
AST: 18 IU/L (ref 0–40)
Albumin: 3.8 g/dL (ref 3.5–4.8)
Alkaline Phosphatase: 74 IU/L (ref 39–117)
BUN/Creatinine Ratio: 27 (ref 12–28)
BUN: 34 mg/dL — ABNORMAL HIGH (ref 8–27)
Bilirubin Total: 0.2 mg/dL (ref 0.0–1.2)
CALCIUM: 10 mg/dL (ref 8.7–10.3)
CO2: 28 mmol/L (ref 20–29)
Chloride: 99 mmol/L (ref 96–106)
Creatinine, Ser: 1.26 mg/dL — ABNORMAL HIGH (ref 0.57–1.00)
GFR calc Af Amer: 49 mL/min/{1.73_m2} — ABNORMAL LOW (ref >59)
GFR, EST NON AFRICAN AMERICAN: 43 mL/min/{1.73_m2} — AB (ref >59)
GLUCOSE: 163 mg/dL — AB (ref 65–99)
Globulin, Total: 4.7 g/dL — ABNORMAL HIGH (ref 1.5–4.5)
POTASSIUM: 4.9 mmol/L (ref 3.5–5.2)
Sodium: 138 mmol/L (ref 134–144)
Total Protein: 8.5 g/dL (ref 6.0–8.5)

## 2016-06-23 LAB — LIPID PANEL
CHOL/HDL RATIO: 3.2 ratio (ref 0.0–4.4)
Cholesterol, Total: 150 mg/dL (ref 100–199)
HDL: 47 mg/dL (ref >39)
LDL Calculated: 78 mg/dL (ref 0–99)
TRIGLYCERIDES: 125 mg/dL (ref 0–149)
VLDL Cholesterol Cal: 25 mg/dL (ref 5–40)

## 2016-07-11 ENCOUNTER — Ambulatory Visit: Payer: Medicare Other | Admitting: Hematology and Oncology

## 2016-07-11 ENCOUNTER — Other Ambulatory Visit: Payer: Medicare Other

## 2016-07-14 ENCOUNTER — Encounter: Payer: Self-pay | Admitting: Hematology and Oncology

## 2016-07-14 ENCOUNTER — Inpatient Hospital Stay: Payer: Medicare Other | Attending: Hematology and Oncology | Admitting: Hematology and Oncology

## 2016-07-14 ENCOUNTER — Inpatient Hospital Stay: Payer: Medicare Other

## 2016-07-14 VITALS — BP 100/74 | HR 54 | Temp 96.1°F | Resp 18 | Wt 204.0 lb

## 2016-07-14 DIAGNOSIS — I429 Cardiomyopathy, unspecified: Secondary | ICD-10-CM | POA: Insufficient documentation

## 2016-07-14 DIAGNOSIS — J45909 Unspecified asthma, uncomplicated: Secondary | ICD-10-CM | POA: Insufficient documentation

## 2016-07-14 DIAGNOSIS — I2510 Atherosclerotic heart disease of native coronary artery without angina pectoris: Secondary | ICD-10-CM | POA: Diagnosis not present

## 2016-07-14 DIAGNOSIS — D649 Anemia, unspecified: Secondary | ICD-10-CM

## 2016-07-14 DIAGNOSIS — C9000 Multiple myeloma not having achieved remission: Secondary | ICD-10-CM

## 2016-07-14 DIAGNOSIS — Z7982 Long term (current) use of aspirin: Secondary | ICD-10-CM | POA: Diagnosis not present

## 2016-07-14 DIAGNOSIS — I700 Atherosclerosis of aorta: Secondary | ICD-10-CM | POA: Diagnosis not present

## 2016-07-14 DIAGNOSIS — I2721 Secondary pulmonary arterial hypertension: Secondary | ICD-10-CM | POA: Diagnosis not present

## 2016-07-14 DIAGNOSIS — E119 Type 2 diabetes mellitus without complications: Secondary | ICD-10-CM | POA: Insufficient documentation

## 2016-07-14 DIAGNOSIS — Z8619 Personal history of other infectious and parasitic diseases: Secondary | ICD-10-CM | POA: Diagnosis not present

## 2016-07-14 DIAGNOSIS — Z7984 Long term (current) use of oral hypoglycemic drugs: Secondary | ICD-10-CM | POA: Diagnosis not present

## 2016-07-14 DIAGNOSIS — I10 Essential (primary) hypertension: Secondary | ICD-10-CM | POA: Diagnosis not present

## 2016-07-14 DIAGNOSIS — Z79899 Other long term (current) drug therapy: Secondary | ICD-10-CM

## 2016-07-14 LAB — CBC WITH DIFFERENTIAL/PLATELET
Basophils Absolute: 0 10*3/uL (ref 0–0.1)
Basophils Relative: 1 %
Eosinophils Absolute: 0.2 10*3/uL (ref 0–0.7)
Eosinophils Relative: 3 %
HCT: 28.4 % — ABNORMAL LOW (ref 35.0–47.0)
Hemoglobin: 9.7 g/dL — ABNORMAL LOW (ref 12.0–16.0)
Lymphocytes Relative: 29 %
Lymphs Abs: 2 10*3/uL (ref 1.0–3.6)
MCH: 29.7 pg (ref 26.0–34.0)
MCHC: 34.1 g/dL (ref 32.0–36.0)
MCV: 87.1 fL (ref 80.0–100.0)
Monocytes Absolute: 0.5 10*3/uL (ref 0.2–0.9)
Monocytes Relative: 7 %
Neutro Abs: 4 10*3/uL (ref 1.4–6.5)
Neutrophils Relative %: 60 %
Platelets: 220 10*3/uL (ref 150–440)
RBC: 3.27 MIL/uL — ABNORMAL LOW (ref 3.80–5.20)
RDW: 15.3 % — ABNORMAL HIGH (ref 11.5–14.5)
WBC: 6.8 10*3/uL (ref 3.6–11.0)

## 2016-07-14 NOTE — Progress Notes (Addendum)
[facility] day:  07/14/2016   Chief Complaint: [name] is a 71 y.o. female with stage II multiple myeloma who is seen for 2 month assessment.  HPI: The patient was last seen in the medical oncology clinic on 05/16/2016.  At that time, she denied any new complaints. She had slowly healing lower extremity wounds. Creatinine was stable at 1.13.  Hemoglobin was 9.8.  Decision was made for ongoing observation without intervention.  CBC on 06/22/2016 revealed a hematocrit of 30, hematoma been 10.1, MCV 86.5, platelets 232,000, white count 5700 with an ANC of 2500.  During the interim, she states "I'm fine". Her husband notes that her legs are "coming along". The right leg is almost healed. The left leg is healing. She denies any other infections. Energy level is good.   Past Medical History:  Diagnosis Date  . Aortic stenosis   . Asthma   . Coronary artery disease   . Diabetes mellitus without complication ([place])   . History of measles   . Hypertension   . Nonischemic cardiomyopathy ([place])   . Pulmonary hypertension ([place])     Past Surgical History:  Procedure Laterality Date  . Cyst(solitary) of breast:removed    . RIGHT/LEFT HEART CATH AND CORONARY ANGIOGRAPHY N/A 03/07/2016   Procedure: Right/Left Heart Cath and Coronary Angiography;  Surgeon: Nelva Bush, MD;  Location: [facility];  Service: Cardiovascular;  Laterality: N/A;  . TUBAL LIGATION      Family History  Problem Relation Age of Onset  . Cancer Mother        Uterine cancer  . Diabetes Mother   . Heart disease Mother   . Heart failure Mother   . Parkinson's disease Father   . Cancer Sister        breast cancer  . Diabetes Brother        Non-insulin Dependent Diabetes Mellitus  . Mesothelioma Brother   . Hypertension Sister   . Diabetes Sister        Non-insulin dependent Diabetes Mellitus    Social History:  reports that she has never smoked. She has  never used smokeless tobacco. She reports that she does not drink alcohol or use drugs.  She is originally from [place]  Her husband was born in [place], [place].  They met in middle school in [place].  She has been married to her husband x 52 years.  The patient is accompanied by her husband, [name], today.  Allergies:  Allergies  Allergen Reactions  . Hydrochlorothiazide     Leg Cramps  . Penicillins Rash    Current Medications: Current Outpatient Prescriptions  Medication Sig Dispense Refill  . ADVAIR HFA 115-21 MCG/ACT inhaler Inhale 2 puffs into the lungs as needed.     Marland Kitchen albuterol (PROAIR HFA) 108 (90 Base) MCG/ACT inhaler Inhale 2 puffs into the lungs every 6 (six) hours as needed. 18 Inhaler 3  . amLODipine (NORVASC) 5 MG tablet Take 1 tablet (5 mg total) by mouth daily. 30 tablet 5  . aspirin EC 81 MG tablet Take 1 tablet (81 mg total) by mouth daily. 90 tablet 3  . fluticasone furoate-vilanterol (BREO ELLIPTA) 200-25 MCG/INH AEPB Inhale 1 puff into the lungs daily. FOR ASTHMA J45.20 1 each 5  . furosemide (LASIX) 40 MG tablet Take 1 tablet (40 mg total) by mouth daily. 90 tablet 3  . HYDROcodone-acetaminophen (NORCO) 7.5-325 MG tablet Take 1 tablet  by mouth every 6 (six) hours as needed for moderate pain. 60 tablet 0  . levalbuterol (XOPENEX) 1.25 MG/3ML nebulizer solution Take 1.25 mg by nebulization every 8 (eight) hours as needed for wheezing. 72 mL 3  . lovastatin (MEVACOR) 20 MG tablet Take 1 tablet (20 mg total) by mouth at bedtime. 90 tablet 4  . meloxicam (MOBIC) 15 MG tablet Take 1 tablet (15 mg total) by mouth daily as needed for pain. Take with food 90 tablet 4  . metFORMIN (GLUCOPHAGE-XR) 750 MG 24 hr tablet Take 2 tablets by mouth daily.  4  . metoprolol (LOPRESSOR) 50 MG tablet Take 1 tablet (50 mg total) by mouth 2 (two) times daily. 180 tablet 3  . montelukast (SINGULAIR) 10 MG tablet Take 10 mg by mouth as needed.  4  . potassium chloride (K-DUR)  10 MEQ tablet Take 1 tablet (10 mEq total) by mouth daily. 30 tablet 3  . sacubitril-valsartan (ENTRESTO) 49-51 MG Take 1 tablet by mouth 2 (two) times daily. 60 tablet 3  . silver sulfADIAZINE (SILVADENE) 1 % cream Apply 1 application topically daily. 85 g 0  . spironolactone (ALDACTONE) 25 MG tablet TAKE 1 TABLET (25 MG TOTAL) BY MOUTH DAILY. 30 tablet 12  . traZODone (DESYREL) 100 MG tablet TAKE 1 TABLET BY MOUTH EVERY NIGHT AT BEDTIME AS NEEDED FOR INSOMNIA 90 tablet 4   No current facility-administered medications for this visit.     Review of Systems:  GENERAL:  "I'm fine".  No fevers or sweats.  Weight up 8 pounds. PERFORMANCE STATUS (ECOG):  1 HEENT:  Chronic vision changes.  No visual changes, runny nose, sore throat, mouth sores or tenderness. Lungs:  Shortness of breath.  No cough.  No hemoptysis. Cardiac:  No chest pain, palpitations, orthopnea, or PND. GI:  No nausea, vomiting, diarrhea, constipation, melena or hematochezia.  Colonoscopy up-to-date. GU:  No urgency, dysuria, or hematuria. Musculoskeletal:  Lower back pain.  No joint pain.  No muscle tenderness. Extremities:  Lower extremity swelling.  Legs and feet hurt (chronic).   Skin:  Healing leg wounds.  No other rashes or skin changes. Neuro:  No headache, numbness or weakness, balance or coordination issues. Endocrine:  Diabetes.  No thyroid issues, hot flashes or night sweats. Psych:  Anxiety.  No mood changes, depression. Pain:  Lower back pain.  Review of systems:  All other systems reviewed and found to be negative.  Physical Exam: Blood pressure 100/74, pulse (!) 54, temperature (!) 96.1 F (35.6 C), temperature source Tympanic, resp. rate 18, weight 204 lb (92.5 kg). GENERAL:  Slightly fatigued appearing woman sitting in a wheelchair in the exam room in no acute distress.  MENTAL STATUS:  Alert and oriented to person, place and time. HEAD:  Pearline Cables styled hair.  Normocephalic, atraumatic, face symmetric, no  Cushingoid features. EYES:Glasses. Blue eyes.  Pupils equal round and reactive to light and accomodation. No conjunctivitis or scleral icterus. RJJ:OACZYSAYTK clear without lesion. Tonguenormal. Mucous membranes moist. RESPIRATORY:Clear to auscultationwithout rales, wheezes or rhonchi. CARDIOVASCULAR:Regular rate andrhythmwithout murmur, rub or gallop. ABDOMEN:Soft, non-tender, with active bowel sounds, and no hepatosplenomegaly. No masses. SKIN: No rashes, ulcers or lesions. EXTREMITIES: Chronic lower extremity edema. Legs wrapped.  No skin discoloration or tenderness. No palpable cords. LYMPHNODES: No palpable cervical, supraclavicular, axillary or inguinal adenopathy  NEUROLOGICAL: Unremarkable. PSYCH: Appropriate.   Appointment on 07/14/2016  Component Date Value Ref Range Status  . WBC 07/14/2016 6.8  3.6 - 11.0 K/uL Final  . RBC  07/14/2016 3.27* 3.80 - 5.20 MIL/uL Final  . Hemoglobin 07/14/2016 9.7* 12.0 - 16.0 g/dL Final  . HCT 07/14/2016 28.4* 35.0 - 47.0 % Final  . MCV 07/14/2016 87.1  80.0 - 100.0 fL Final  . MCH 07/14/2016 29.7  26.0 - 34.0 pg Final  . MCHC 07/14/2016 34.1  32.0 - 36.0 g/dL Final  . RDW 07/14/2016 15.3* 11.5 - 14.5 % Final  . Platelets 07/14/2016 220  150 - 440 K/uL Final  . Neutrophils Relative % 07/14/2016 60  % Final  . Neutro Abs 07/14/2016 4.0  1.4 - 6.5 K/uL Final  . Lymphocytes Relative 07/14/2016 29  % Final  . Lymphs Abs 07/14/2016 2.0  1.0 - 3.6 K/uL Final  . Monocytes Relative 07/14/2016 7  % Final  . Monocytes Absolute 07/14/2016 0.5  0.2 - 0.9 K/uL Final  . Eosinophils Relative 07/14/2016 3  % Final  . Eosinophils Absolute 07/14/2016 0.2  0 - 0.7 K/uL Final  . Basophils Relative 07/14/2016 1  % Final  . Basophils Absolute 07/14/2016 0.0  0 - 0.1 K/uL Final    Assessment:  CYDNIE DEASON is a 71 y.o. female with stage II myeloma.  SPEP on 01/25/2016 revealed a 0.9 gm/dL IgG monoclonal gammopathy.  She denied any bone  pain or recurrent infections.  Calcium and creatinine were normal.  Urinalysis revealed 30+ protein.  Work-up on 02/14/2016 revealed a hematocrit of 29.1, hemoglobin 9.4, MCV 84.6, platelets 225,000, white count 5700 with an [place] of 3700.  SPEP revealed a 1.9 g/dL monoclonal spike.  Kappa free light chains were 237.2, lambda free light chains 38.7 with a ratio of 6.13 (0.26-1.65).  Beta 2-microglobulin was 3.9 (0.6-2.4).  BMP revealed a creatinine of 0.95 and calcium 9.0.  24 hour UPEP revealed 31 mg/24 hours of an IgG monoclonal protein with kappa light chain specificity.  Bence Jones protein was positive, kappa type.  Bone marrow on 02/29/2016 revealed a hypercellular marrow for age with plasmacytosis (plasma cells 10%).  There was trilineage hematopoiesis. Immunohistochemical stains highlighted the plasma cell component which generally showed a polyclonal staining pattern for kappa and lambda light chain although there was kappa light chain excess especially in several plasma cell clusters. The features were most suggestive of early plasma cell dyscrasia/neoplasm.  Cytogenetics were normal (10, XX).  Bone survey on 03/22/2016 revealed no focal lesion or acute bony abnormality.  PET scan on 03/31/2016 revealed no significant abnormal hypermetabolic activity is identified in the head, neck, chest, abdomen, pelvis, or skeleton.  SPEP has been followed: 0.9 gm/dL on 01/25/2016, 1.9 on 02/14/2016, 1.7 on 03/24/2016, 0.7 on 05/16/2016, and 0.8 on 07/14/2016.   She has a normocytic anemia.  Work-up on 01/25/2016 and 02/14/2016 revealed a normal ferritin (90), iron studies, folate, and thyroid function tests.  B12 was 312 (low normal) on 01/25/2016, but with a normal MMA on 02/14/2016 thus ruling out B12 deficiency.  Reticulocyte count is inappropiately low.  Last colonoscopy was 2 years ago.  Guaiac cards were negative x 3 in 04/2016.  Cardiac catheterization on 03/07/2016 revealed non-obstructive coronary  artery disease.  She has severe pulmonary artery hypertension with moderate to severely elevated right heart filling pressures and moderately elevated left heart filling pressures.   Symptomatically, she feels fine.  She has slowly healing lower leg wounds.  Creatinine was 1.26 on 06/22/2016.  Hemoglobin is 9.7.  Plan: 1.  Labs today:  CBC with diff, CMP, SPEP. 2.  Discuss stability in counts and plan for  ongoing close surveillance.  Review indications for treatment. 3.  RTC in 2 months for labs (CBC with diff, BMP, SPEP) 4.  RTC in 4 months for MD assess and labs (CBC with diff, CMP, SPEP).   Lequita Asal, MD  07/14/2016, 12:42 PM

## 2016-07-14 NOTE — Progress Notes (Signed)
Patient continues to have lower extremity edema.  She has an open wound on her left lower leg.  Dr. Caryn Section is managing this problem for her.  She is having an echo and leg scan on the 21st.  Also saw her cardiologist in early July.  Patient currently taking 40 mg Lasix daily.

## 2016-07-20 ENCOUNTER — Other Ambulatory Visit: Payer: Medicare Other

## 2016-07-20 LAB — PROTEIN ELECTROPHORESIS, SERUM
A/G Ratio: 0.7 (ref 0.7–1.7)
Albumin ELP: 3.1 g/dL (ref 2.9–4.4)
Alpha-1-Globulin: 0.2 g/dL (ref 0.0–0.4)
Alpha-2-Globulin: 0.8 g/dL (ref 0.4–1.0)
Beta Globulin: 1 g/dL (ref 0.7–1.3)
Gamma Globulin: 2.6 g/dL — ABNORMAL HIGH (ref 0.4–1.8)
Globulin, Total: 4.7 g/dL — ABNORMAL HIGH (ref 2.2–3.9)
M-Spike, %: 0.8 g/dL — ABNORMAL HIGH
Total Protein ELP: 7.8 g/dL (ref 6.0–8.5)

## 2016-07-26 ENCOUNTER — Ambulatory Visit (INDEPENDENT_AMBULATORY_CARE_PROVIDER_SITE_OTHER): Payer: Medicare Other

## 2016-07-26 ENCOUNTER — Other Ambulatory Visit: Payer: Self-pay | Admitting: Internal Medicine

## 2016-07-26 ENCOUNTER — Other Ambulatory Visit: Payer: Self-pay

## 2016-07-26 ENCOUNTER — Ambulatory Visit: Payer: Medicare Other

## 2016-07-26 DIAGNOSIS — I5042 Chronic combined systolic (congestive) and diastolic (congestive) heart failure: Secondary | ICD-10-CM | POA: Diagnosis not present

## 2016-07-26 DIAGNOSIS — L98499 Non-pressure chronic ulcer of skin of other sites with unspecified severity: Secondary | ICD-10-CM

## 2016-07-26 DIAGNOSIS — R0989 Other specified symptoms and signs involving the circulatory and respiratory systems: Secondary | ICD-10-CM

## 2016-07-26 DIAGNOSIS — I428 Other cardiomyopathies: Secondary | ICD-10-CM

## 2016-08-02 ENCOUNTER — Encounter: Payer: Self-pay | Admitting: Internal Medicine

## 2016-08-02 ENCOUNTER — Ambulatory Visit (INDEPENDENT_AMBULATORY_CARE_PROVIDER_SITE_OTHER): Payer: Medicare Other | Admitting: Internal Medicine

## 2016-08-02 VITALS — BP 110/54 | HR 59 | Ht 63.0 in | Wt 206.0 lb

## 2016-08-02 DIAGNOSIS — R600 Localized edema: Secondary | ICD-10-CM | POA: Diagnosis not present

## 2016-08-02 DIAGNOSIS — I10 Essential (primary) hypertension: Secondary | ICD-10-CM

## 2016-08-02 DIAGNOSIS — S81809D Unspecified open wound, unspecified lower leg, subsequent encounter: Secondary | ICD-10-CM | POA: Diagnosis not present

## 2016-08-02 DIAGNOSIS — I5042 Chronic combined systolic (congestive) and diastolic (congestive) heart failure: Secondary | ICD-10-CM | POA: Diagnosis not present

## 2016-08-02 DIAGNOSIS — I5022 Chronic systolic (congestive) heart failure: Secondary | ICD-10-CM

## 2016-08-02 DIAGNOSIS — I2510 Atherosclerotic heart disease of native coronary artery without angina pectoris: Secondary | ICD-10-CM | POA: Diagnosis not present

## 2016-08-02 NOTE — Patient Instructions (Signed)
Labwork: We will call with results.   Follow-Up: Your physician recommends that you schedule a follow-up appointment in: 3 months with Dr. Saunders Revel.  It was a pleasure seeing you today here in the office. Please do not hesitate to give [place] a call back if you have any further questions. [place], BSN

## 2016-08-02 NOTE — Progress Notes (Signed)
Follow-up Outpatient Visit Date: 08/02/2016  Primary Care Provider: Birdie Sons, MD [address] [place] [address]  Chief Complaint: Leg pain and heart failure  HPI:  Ms. Bovee is a 71 y.o. year-old female with history of non-obstructive coronary artery disease, non-ischemic cardiomyopathy with chronic systolic and diastolic heart failure, hypertension, diabetes mellitus, and multiple myeloma, who presents for follow-up of leg pain and heart failure. Last saw her on 06/14/16, which time she was doing relatively well with the exception of continued leg swelling and painful wounds on both legs. We agreed to repeat an echocardiogram as well as obtain ABIs to exclude arterial insufficiency. Today, Ms. Struthers reports feeling a little bit better. She continues to have pain and swelling of both calves, though it has come down with the increased dose of Lasix that we discussed at our last visit. She continues to apply Silvadene to her calf wounds. She follows with Dr. Caryn Section for management of her wounds.  Echocardiogram last month revealed normalization of LVEF. Arterial studies showed normal to elevated ABIs and TBI's but brisk triphasic waveform suggestive of arterial calcification without high-grade obstruction. Ms. Gitto denies significant shortness of breath, orthopnea, PND, palpitations, lightheadedness and chest pain.  --------------------------------------------------------------------------------------------------  Cardiovascular History & Procedures: Cardiovascular Problems:  Chronic systolic and diastolic heart failure secondary to non-ischemic cardiomyopathy  Nonobstructive coronary artery disease  Moderate aortic stenosis  Risk Factors:  Known coronary artery disease, hypertension, hyperlipidemia, diabetes mellitus, sedentary lifestyle, obesity, and age > 62  Cath/PCI:  LHC/RHC (03/07/16): LMCA normal. LAD with 30% ostial stenosi, 30$ midvessel narrowing, and  50% distal disease. LCx with 40% midvessel lesion. RCA with 30% proximal narrowing. LVEDP 26 mmHg. RA 18 (prominent "M" appearance), RV 82/21, PA mean 55, PCWP 25. Ao sat 96%, PA sat 54%, Fick Co/Ci 3.2/1.7. PVR 9.4 Wood units. No significant AoV gradient.  CV Surgery:  None  EP Procedures and Devices:  None  Non-Invasive Evaluation(s):  ABI's (07/26/16): Right: ABI not obtained, TBI 1.8. Left: ABI 1.9, TBI 1.0. Triphasic waveforms noted in both lower extremities.  TTE (07/26/16): Normal LV size with moderate LVH. LVEF 55-60% with normal wall motion. Grade 1 diastolic dysfunction. Mild aortic stenosis (mean gradient 11 mmHg). Mitral annular calcification with mild to moderate MR. Mild left atrial enlargement. Normal RV size and function. Normal PA pressure.  TTE (02/25/16): Normal LV size with moderate LVH. LVEF 25-30% with grade 2 diastolic dysfunction. Thickened aortic valve with likely moderate stenosis. MAC with mild MR. Mild LA enlargement. Mildly to moderately reduced RV contraction. Mild-moderate TR. Moderately PH (RVSP 55 mmHg).  Recent CV Pertinent Labs: Lab Results  Component Value Date   CHOL 150 06/22/2016   HDL 47 06/22/2016   LDLCALC 78 06/22/2016   TRIG 125 06/22/2016   CHOLHDL 3.2 06/22/2016   INR 1.0 03/02/2016   BNP 1,592.0 (H) 02/09/2016   K 4.9 06/22/2016   BUN 34 (H) 06/22/2016   CREATININE 1.26 (H) 06/22/2016    Past medical and surgical history were reviewed and updated in EPIC.  Outpatient Encounter Prescriptions as of 08/02/2016  Medication Sig  . ADVAIR HFA 115-21 MCG/ACT inhaler Inhale 2 puffs into the lungs as needed.   Marland Kitchen albuterol (PROAIR HFA) 108 (90 Base) MCG/ACT inhaler Inhale 2 puffs into the lungs every 6 (six) hours as needed.  Marland Kitchen amLODipine (NORVASC) 5 MG tablet Take 1 tablet (5 mg total) by mouth daily.  Marland Kitchen aspirin EC 81 MG tablet Take 1 tablet (81 mg  total) by mouth daily.  . fluticasone furoate-vilanterol (BREO ELLIPTA) 200-25 MCG/INH AEPB  Inhale 1 puff into the lungs daily. FOR ASTHMA J45.20  . furosemide (LASIX) 40 MG tablet Take 1 tablet (40 mg total) by mouth daily.  Marland Kitchen HYDROcodone-acetaminophen (NORCO) 7.5-325 MG tablet Take 1 tablet by mouth every 6 (six) hours as needed for moderate pain.  Marland Kitchen levalbuterol (XOPENEX) 1.25 MG/3ML nebulizer solution Take 1.25 mg by nebulization every 8 (eight) hours as needed for wheezing.  . lovastatin (MEVACOR) 20 MG tablet Take 1 tablet (20 mg total) by mouth at bedtime.  . meloxicam (MOBIC) 15 MG tablet Take 1 tablet (15 mg total) by mouth daily as needed for pain. Take with food  . metFORMIN (GLUCOPHAGE-XR) 750 MG 24 hr tablet Take 2 tablets by mouth daily.  . metoprolol (LOPRESSOR) 50 MG tablet Take 1 tablet (50 mg total) by mouth 2 (two) times daily.  . montelukast (SINGULAIR) 10 MG tablet Take 10 mg by mouth as needed.  . potassium chloride (K-DUR) 10 MEQ tablet Take 1 tablet (10 mEq total) by mouth daily.  . sacubitril-valsartan (ENTRESTO) 49-51 MG Take 1 tablet by mouth 2 (two) times daily.  . silver sulfADIAZINE (SILVADENE) 1 % cream Apply 1 application topically daily.  Marland Kitchen spironolactone (ALDACTONE) 25 MG tablet TAKE 1 TABLET (25 MG TOTAL) BY MOUTH DAILY.  . traZODone (DESYREL) 100 MG tablet TAKE 1 TABLET BY MOUTH EVERY NIGHT AT BEDTIME AS NEEDED FOR INSOMNIA   No facility-administered encounter medications on file as of 08/02/2016.     Allergies: Hydrochlorothiazide and Penicillins  Social History   Social History  . Marital status: Married    Spouse name: N/A  . Number of children: N/A  . Years of education: N/A   Occupational History  . Not on file.   Social History Main Topics  . Smoking status: Never Smoker  . Smokeless tobacco: Never Used  . Alcohol use No     Comment: maybe once a year  . Drug use: No  . Sexual activity: Not on file   Other Topics Concern  . Not on file   Social History Narrative  . No narrative on file    Family History  Problem Relation  Age of Onset  . Cancer Mother        Uterine cancer  . Diabetes Mother   . Heart disease Mother   . Heart failure Mother   . Parkinson's disease Father   . Cancer Sister        breast cancer  . Diabetes Brother        Non-insulin Dependent Diabetes Mellitus  . Mesothelioma Brother   . Hypertension Sister   . Diabetes Sister        Non-insulin dependent Diabetes Mellitus    Review of Systems: A 12-system review of systems was performed and was negative except as noted in the HPI.  --------------------------------------------------------------------------------------------------  Physical Exam: BP (!) 110/54 (BP Location: Left Arm, Patient Position: Sitting, Cuff Size: Normal)   Pulse (!) 59   Ht _0  (1.6 m)   Wt 206 lb (93.4 kg)   BMI 36.49 kg/m   General:  Obese woman, seated comfortably in the exam room. She is accompanied by her husband. HEENT: No conjunctival pallor or scleral icterus.  Moist mucous membranes.  OP clear. Neck: Supple without lymphadenopathy, thyromegaly, JVD, or HJR, though evaluation is limited by body habitus. Lungs: Normal work of breathing.  Clear to auscultation bilaterally without wheezes or crackles.  Heart: Distant heart sounds. Regular rate and rhythm without murmurs, rubs, or gallops.  Non-displaced PMI. Abd: Bowel sounds present.  Soft, NT/ND. Unable to assess hepatosplenomegaly due to body habitus. Ext: 1+ pretibial edema bilaterally. Small superficial ulcerations noted in both calves, most of which are covered with clean dressings. 1+ radial and pedal pulses bilaterally.  Skin: Warm and dry.   Lab Results  Component Value Date   WBC 6.8 07/14/2016   HGB 9.7 (L) 07/14/2016   HCT 28.4 (L) 07/14/2016   MCV 87.1 07/14/2016   PLT 220 07/14/2016    Lab Results  Component Value Date   NA 138 06/22/2016   K 4.9 06/22/2016   CL 99 06/22/2016   CO2 28 06/22/2016   BUN 34 (H) 06/22/2016   CREATININE 1.26 (H) 06/22/2016   GLUCOSE 163 (H)  06/22/2016   ALT 11 06/22/2016    Lab Results  Component Value Date   CHOL 150 06/22/2016   HDL 47 06/22/2016   LDLCALC 78 06/22/2016   TRIG 125 06/22/2016   CHOLHDL 3.2 06/22/2016    --------------------------------------------------------------------------------------------------  ASSESSMENT AND PLAN: Chronic systolic and diastolic heart failure Overall, Ms. Hachey appears stable to improved from a volume status. She has NYHA class II heart failure symptoms, though her functional capacity is predominantly limited by her leg pain. Echocardiogram last month shows normalization of LVEF with evidence based heart failure therapy. We will continue her current regimen. I will check a basic metabolic panel today to ensure stable renal function and electrolytes given escalation of furosemide at our last visit.  Coronary artery disease without angina Ms. Ferriss has not had any chest pain or shortness of breath to suggest worsening coronary insufficiency. We will continue her current medications.  Hypertension Blood pressure is normal to borderline low today. Ms. Fleek does not have any symptoms of hypotension. We will therefore continue current regimen.  Bilateral lower extremity edema and wounds Wounds are stable with somewhat decreased swelling compared to our last visit. Recent ABIs and TBIs suggest arterial calcification but no high-grade obstruction. I have encouraged Mrs. Topp to elevate her legs, though she is hesitant to do this because it actually worsens the pain. I have asked her to follow-up with Dr. Caryn Section and to discuss referral to the wound care clinic to help with healing.  Follow-up: Return to clinic in 3 months.  Nelva Bush, MD 08/02/2016 11:41 AM

## 2016-08-03 LAB — BASIC METABOLIC PANEL WITH GFR
BUN/Creatinine Ratio: 29 — ABNORMAL HIGH (ref 12–28)
BUN: 37 mg/dL — ABNORMAL HIGH (ref 8–27)
CO2: 22 mmol/L (ref 20–29)
Calcium: 9.6 mg/dL (ref 8.7–10.3)
Chloride: 103 mmol/L (ref 96–106)
Creatinine, Ser: 1.26 mg/dL — ABNORMAL HIGH (ref 0.57–1.00)
GFR calc Af Amer: 49 mL/min/1.73 — ABNORMAL LOW
GFR calc non Af Amer: 43 mL/min/1.73 — ABNORMAL LOW
Glucose: 153 mg/dL — ABNORMAL HIGH (ref 65–99)
Potassium: 4.8 mmol/L (ref 3.5–5.2)
Sodium: 138 mmol/L (ref 134–144)

## 2016-08-04 ENCOUNTER — Encounter: Payer: Self-pay | Admitting: Internal Medicine

## 2016-08-10 ENCOUNTER — Other Ambulatory Visit: Payer: Self-pay

## 2016-08-10 MED ORDER — POTASSIUM CHLORIDE ER 10 MEQ PO TBCR: 10.0000 meq | EXTENDED_RELEASE_TABLET | Freq: Every day | ORAL | 3 refills | Status: DC

## 2016-08-10 NOTE — Telephone Encounter (Signed)
Requested Prescriptions   Signed Prescriptions Disp Refills  . potassium chloride (K-DUR) 10 MEQ tablet 30 tablet 3    Sig: Take 1 tablet (10 mEq total) by mouth daily.    Authorizing Provider: END, CHRISTOPHER    Ordering User: Janan Ridge

## 2016-08-25 ENCOUNTER — Other Ambulatory Visit: Payer: Self-pay | Admitting: Internal Medicine

## 2016-08-28 ENCOUNTER — Other Ambulatory Visit: Payer: Self-pay | Admitting: Family Medicine

## 2016-08-28 DIAGNOSIS — L98491 Non-pressure chronic ulcer of skin of other sites limited to breakdown of skin: Secondary | ICD-10-CM

## 2016-08-30 DIAGNOSIS — E113393 Type 2 diabetes mellitus with moderate nonproliferative diabetic retinopathy without macular edema, bilateral: Secondary | ICD-10-CM | POA: Diagnosis not present

## 2016-09-14 ENCOUNTER — Other Ambulatory Visit: Payer: Self-pay | Admitting: Internal Medicine

## 2016-09-14 ENCOUNTER — Inpatient Hospital Stay: Payer: Medicare Other | Attending: Hematology and Oncology

## 2016-09-14 DIAGNOSIS — C9000 Multiple myeloma not having achieved remission: Secondary | ICD-10-CM | POA: Insufficient documentation

## 2016-09-14 DIAGNOSIS — D649 Anemia, unspecified: Secondary | ICD-10-CM

## 2016-09-14 LAB — CBC WITH DIFFERENTIAL/PLATELET
Basophils Absolute: 0 10*3/uL (ref 0–0.1)
Basophils Relative: 1 %
Eosinophils Absolute: 0.1 10*3/uL (ref 0–0.7)
Eosinophils Relative: 2 %
HCT: 25.9 % — ABNORMAL LOW (ref 35.0–47.0)
Hemoglobin: 8.8 g/dL — ABNORMAL LOW (ref 12.0–16.0)
Lymphocytes Relative: 31 %
Lymphs Abs: 1.8 10*3/uL (ref 1.0–3.6)
MCH: 30.3 pg (ref 26.0–34.0)
MCHC: 33.9 g/dL (ref 32.0–36.0)
MCV: 89.5 fL (ref 80.0–100.0)
Monocytes Absolute: 0.6 10*3/uL (ref 0.2–0.9)
Monocytes Relative: 11 %
Neutro Abs: 3.3 10*3/uL (ref 1.4–6.5)
Neutrophils Relative %: 55 %
Platelets: 247 10*3/uL (ref 150–440)
RBC: 2.9 MIL/uL — ABNORMAL LOW (ref 3.80–5.20)
RDW: 14.4 % (ref 11.5–14.5)
WBC: 6 10*3/uL (ref 3.6–11.0)

## 2016-09-14 LAB — BASIC METABOLIC PANEL
Anion gap: 6 (ref 5–15)
BUN: 48 mg/dL — ABNORMAL HIGH (ref 6–20)
CO2: 26 mmol/L (ref 22–32)
Calcium: 9.9 mg/dL (ref 8.9–10.3)
Chloride: 103 mmol/L (ref 101–111)
Creatinine, Ser: 1.47 mg/dL — ABNORMAL HIGH (ref 0.44–1.00)
GFR calc Af Amer: 40 mL/min — ABNORMAL LOW (ref >60)
GFR calc non Af Amer: 34 mL/min — ABNORMAL LOW (ref >60)
Glucose, Bld: 124 mg/dL — ABNORMAL HIGH (ref 65–99)
Potassium: 4.9 mmol/L (ref 3.5–5.1)
Sodium: 135 mmol/L (ref 135–145)

## 2016-09-15 LAB — PROTEIN ELECTROPHORESIS, SERUM
A/G Ratio: 0.6 — ABNORMAL LOW (ref 0.7–1.7)
Albumin ELP: 3.2 g/dL (ref 2.9–4.4)
Alpha-1-Globulin: 0.3 g/dL (ref 0.0–0.4)
Alpha-2-Globulin: 0.9 g/dL (ref 0.4–1.0)
Beta Globulin: 1.1 g/dL (ref 0.7–1.3)
Gamma Globulin: 2.9 g/dL — ABNORMAL HIGH (ref 0.4–1.8)
Globulin, Total: 5.2 g/dL — ABNORMAL HIGH (ref 2.2–3.9)
M-Spike, %: 1.6 g/dL — ABNORMAL HIGH
Total Protein ELP: 8.4 g/dL (ref 6.0–8.5)

## 2016-09-21 ENCOUNTER — Ambulatory Visit (INDEPENDENT_AMBULATORY_CARE_PROVIDER_SITE_OTHER): Payer: Medicare Other

## 2016-09-21 ENCOUNTER — Ambulatory Visit (INDEPENDENT_AMBULATORY_CARE_PROVIDER_SITE_OTHER): Payer: Medicare Other | Admitting: Family Medicine

## 2016-09-21 ENCOUNTER — Ambulatory Visit: Payer: Self-pay

## 2016-09-21 VITALS — BP 118/58 | HR 60 | Temp 97.5°F | Ht 63.0 in | Wt 206.2 lb

## 2016-09-21 DIAGNOSIS — I10 Essential (primary) hypertension: Secondary | ICD-10-CM

## 2016-09-21 DIAGNOSIS — E7800 Pure hypercholesterolemia, unspecified: Secondary | ICD-10-CM

## 2016-09-21 DIAGNOSIS — R319 Hematuria, unspecified: Secondary | ICD-10-CM

## 2016-09-21 DIAGNOSIS — Z1159 Encounter for screening for other viral diseases: Secondary | ICD-10-CM | POA: Diagnosis not present

## 2016-09-21 DIAGNOSIS — N390 Urinary tract infection, site not specified: Secondary | ICD-10-CM | POA: Insufficient documentation

## 2016-09-21 DIAGNOSIS — E1121 Type 2 diabetes mellitus with diabetic nephropathy: Secondary | ICD-10-CM

## 2016-09-21 DIAGNOSIS — Z23 Encounter for immunization: Secondary | ICD-10-CM

## 2016-09-21 DIAGNOSIS — M545 Low back pain, unspecified: Secondary | ICD-10-CM

## 2016-09-21 DIAGNOSIS — S81802D Unspecified open wound, left lower leg, subsequent encounter: Secondary | ICD-10-CM

## 2016-09-21 DIAGNOSIS — Z0000 Encounter for general adult medical examination without abnormal findings: Secondary | ICD-10-CM

## 2016-09-21 LAB — POCT URINALYSIS DIPSTICK
BILIRUBIN UA: NEGATIVE
Glucose, UA: NEGATIVE
KETONES: NEGATIVE
Nitrite, UA: NEGATIVE
PH UA: 6.5 (ref 5.0–8.0)
Spec Grav, UA: 1.025 (ref 1.010–1.025)
Urobilinogen, UA: 0.2 E.U./dL

## 2016-09-21 LAB — POCT GLYCOSYLATED HEMOGLOBIN (HGB A1C)
ESTIMATED AVERAGE GLUCOSE: 174
HEMOGLOBIN A1C: 7.7

## 2016-09-21 MED ORDER — HYDROCODONE-ACETAMINOPHEN 10-325 MG PO TABS: 1.0000 | ORAL_TABLET | Freq: Three times a day (TID) | ORAL | 0 refills | Status: DC | PRN

## 2016-09-21 MED ORDER — CIPROFLOXACIN HCL 500 MG PO TABS: 500.0000 mg | ORAL_TABLET | Freq: Two times a day (BID) | ORAL | 0 refills | Status: DC

## 2016-09-21 NOTE — Progress Notes (Signed)
Subjective:   [name] is a 71 y.o. female who presents for Medicare Annual (Subsequent) preventive examination.  Review of Systems:  N/A  Cardiac Risk Factors include: advanced age (>77men, >63 women);diabetes mellitus;dyslipidemia;hypertension;obesity (BMI >30kg/m2)     Objective:     Vitals: BP (!) 118/58 (BP Location: Left Arm)   Pulse 60   Temp (!) 97.5 F (36.4 C) (Oral)   Ht 5\' 3"  (1.6 m)   Wt 206 lb 3.2 oz (93.5 kg)   BMI 36.53 kg/m   Body mass index is 36.53 kg/m.   Tobacco History  Smoking Status  . Never Smoker  Smokeless Tobacco  . Never Used     Counseling given: Not Answered   Past Medical History:  Diagnosis Date  . Aortic stenosis   . Asthma   . Coronary artery disease   . Diabetes mellitus without complication ([place])   . History of measles   . Hypertension   . Nonischemic cardiomyopathy ([place])   . Pulmonary hypertension ([place])    Past Surgical History:  Procedure Laterality Date  . Cyst(solitary) of breast:removed    . RIGHT/LEFT HEART CATH AND CORONARY ANGIOGRAPHY N/A 03/07/2016   Procedure: Right/Left Heart Cath and Coronary Angiography;  Surgeon: Nelva Bush, MD;  Location: [facility];  Service: Cardiovascular;  Laterality: N/A;  . TUBAL LIGATION     Family History  Problem Relation Age of Onset  . Cancer Mother        Uterine cancer  . Diabetes Mother   . Heart disease Mother   . Heart failure Mother   . Parkinson's disease Father   . Cancer Sister        breast cancer  . Diabetes Brother        Non-insulin Dependent Diabetes Mellitus  . Mesothelioma Brother   . Hypertension Sister   . Diabetes Sister        Non-insulin dependent Diabetes Mellitus   History  Sexual Activity  . Sexual activity: Not on file    Outpatient Encounter Prescriptions as of 09/21/2016  Medication Sig  . ADVAIR HFA 115-21 MCG/ACT inhaler Inhale 2 puffs into the lungs as needed.   Marland Kitchen albuterol (PROAIR HFA) 108 (90 Base) MCG/ACT  inhaler Inhale 2 puffs into the lungs every 6 (six) hours as needed.  Marland Kitchen amLODipine (NORVASC) 5 MG tablet TAKE 1 TABLET (5 MG TOTAL) BY MOUTH DAILY.  Marland Kitchen aspirin EC 81 MG tablet Take 1 tablet (81 mg total) by mouth daily.  Marland Kitchen ENTRESTO 49-51 MG TAKE 1 TABLET BY MOUTH 2 (TWO) TIMES DAILY.  . fluticasone furoate-vilanterol (BREO ELLIPTA) 200-25 MCG/INH AEPB Inhale 1 puff into the lungs daily. FOR ASTHMA J45.20  . HYDROcodone-acetaminophen (NORCO) 7.5-325 MG tablet Take 1 tablet by mouth every 6 (six) hours as needed for moderate pain. (Patient taking differently: Take 1 tablet by mouth every 6 (six) hours as needed for moderate pain. )  . levalbuterol (XOPENEX) 1.25 MG/3ML nebulizer solution Take 1.25 mg by nebulization every 8 (eight) hours as needed for wheezing.  . lovastatin (MEVACOR) 20 MG tablet Take 1 tablet (20 mg total) by mouth at bedtime.  . meloxicam (MOBIC) 15 MG tablet Take 1 tablet (15 mg total) by mouth daily as needed for pain. Take with food  . metFORMIN (GLUCOPHAGE-XR) 750 MG 24 hr tablet Take 2 tablets by mouth daily.  . montelukast (SINGULAIR) 10 MG tablet Take 10 mg by mouth as needed.  . potassium chloride (K-DUR) 10 MEQ tablet  Take 1 tablet (10 mEq total) by mouth daily.  . silver sulfADIAZINE (SILVADENE) 1 % cream APPLY TO AFFECTED AREA DAILY  . spironolactone (ALDACTONE) 25 MG tablet TAKE 1 TABLET (25 MG TOTAL) BY MOUTH DAILY.  . traZODone (DESYREL) 100 MG tablet TAKE 1 TABLET BY MOUTH EVERY NIGHT AT BEDTIME AS NEEDED FOR INSOMNIA  . furosemide (LASIX) 40 MG tablet Take 1 tablet (40 mg total) by mouth daily.  . metoprolol (LOPRESSOR) 50 MG tablet Take 1 tablet (50 mg total) by mouth 2 (two) times daily.   No facility-administered encounter medications on file as of 09/21/2016.     Activities of Daily Living In your present state of health, do you have any difficulty performing the following activities: 09/21/2016  Hearing? N  Vision? N  Difficulty concentrating or making  decisions? N  Walking or climbing stairs? Y  Comment due to leg pain  Dressing or bathing? N  Doing errands, shopping? Y  Preparing Food and eating ? N  Using the Toilet? N  In the past six months, have you accidently leaked urine? Y  Comment wears protection  Do you have problems with loss of bowel control? N  Managing your Medications? Y  Managing your Finances? Y  Housekeeping or managing your Housekeeping? N  Some recent data might be hidden    Patient Care Team: Birdie Sons, MD as PCP - General (Family Medicine) End, Harrell Gave, MD as Consulting Physician (Cardiology) Eulogio Bear, MD as Consulting Physician (Ophthalmology) Lequita Asal, MD as Referring Physician (Hematology and Oncology)    Assessment:     Exercise Activities and Dietary recommendations Current Exercise Habits: The patient does not participate in regular exercise at present, Exercise limited by: orthopedic condition(s);Other - see comments (leg pain from diabetes)  Goals    . Fall Prevention          Recommend removing all area rugs from the home to prevent falls.       Fall Risk Fall Risk  09/21/2016 09/08/2015 07/01/2014  Falls in the past year? Yes Yes Yes  Number falls in past yr: 2 or more 2 or more 1  Injury with Fall? No No No  Follow up Falls prevention discussed - Falls evaluation completed   Depression Screen PHQ 2/9 Scores 09/21/2016 09/08/2015 07/01/2014  PHQ - 2 Score 2 0 2  PHQ- 9 Score 7 - 3     Cognitive Function- Pt declined screening today.         Immunization History  Administered Date(s) Administered  . Influenza, High Dose Seasonal PF 12/02/2014, 09/08/2015, 09/21/2016  . Pneumococcal Conjugate-13 05/09/2013  . Pneumococcal Polysaccharide-23 01/19/2011   Screening Tests Health Maintenance  Topic Date Due  . FOOT EXAM  05/12/1954  . MAMMOGRAM  07/07/2016  . TETANUS/TDAP  09/03/2026 (Originally 05/12/1963)  . HEMOGLOBIN A1C  11/18/2016  . URINE  MICROALBUMIN  06/22/2017  . OPHTHALMOLOGY EXAM  08/30/2017  . COLONOSCOPY  07/10/2022  . INFLUENZA VACCINE  Completed  . DEXA SCAN  Completed  . Hepatitis C Screening  Completed  . PNA vac Low Risk Adult  Completed      Plan:  I have personally reviewed and addressed the Medicare Annual Wellness questionnaire and have noted the following in the patient's chart:  A. Medical and social history B. Use of alcohol, tobacco or illicit drugs  C. Current medications and supplements D. Functional ability and status E.  Nutritional status F.  Physical activity G. Advance directives H.  List of other physicians I.  Hospitalizations, surgeries, and ER visits in previous 12 months J.  [place] such as hearing and vision if needed, cognitive and depression L. Referrals and appointments - none  In addition, I have reviewed and discussed with patient certain preventive protocols, quality metrics, and best practice recommendations. A written personalized care plan for preventive services as well as general preventive health recommendations were provided to patient.  See attached scanned questionnaire for additional information.   Signed,  Fabio Neighbors, LPN Nurse Health Advisor   MD Recommendations: Pt needs a diabetic foot exam today. Pt to set up mammogram next year.

## 2016-09-21 NOTE — Progress Notes (Signed)
Patient: [name] Female    DOB: 1945/11/21   71 y.o.   MRN: [id number] Visit Date: 09/21/2016  Today's Provider: Lelon Huh, MD   Chief Complaint  Patient presents with  . Diabetes  . Hypertension  . Hyperlipidemia   Subjective:    HPI  Diabetes Mellitus Type II, Follow-up:   Lab Results  Component Value Date   HGBA1C 7.7 09/21/2016   HGBA1C 7.6 05/18/2016   HGBA1C 6.6 01/25/2016    Last seen for diabetes 4 months ago.  Management since then includes no changes. She reports good compliance with treatment. She is not having side effects.  Current symptoms include none and have been stable. Home blood sugar records: trend: stable  Episodes of hypoglycemia? no   Current Insulin Regimen: none Most Recent Eye Exam: due Weight trend: stable Prior visit with dietician: no Current diet: well balanced Current exercise: none  Pertinent Labs:    Component Value Date/Time   CHOL 150 06/22/2016 1226   TRIG 125 06/22/2016 1226   HDL 47 06/22/2016 1226   LDLCALC 78 06/22/2016 1226   CREATININE 1.47 (H) 09/14/2016 1049    Wt Readings from Last 3 Encounters:  09/21/16 206 lb 3.2 oz (93.5 kg)  08/02/16 206 lb (93.4 kg)  07/14/16 204 lb (92.5 kg)     Hypertension, follow-up:  BP Readings from Last 3 Encounters:  09/21/16 (!) 118/58  08/02/16 (!) 110/54  07/14/16 100/74    She was last seen for hypertension 6 months ago.  BP at that visit was 118/62. Management since that visit includes no changes. She has this followed by cardiology. She reports good compliance with treatment. She is not having side effects.  She is not exercising. She is adherent to low salt diet.   Outside blood pressures are checked occasionally. She is experiencing none.  Patient denies chest pressure/discomfort, exertional chest pressure/discomfort and palpitations.   Cardiovascular risk factors include diabetes mellitus and dyslipidemia.   Weight trend: stable Wt  Readings from Last 3 Encounters:  09/21/16 206 lb 3.2 oz (93.5 kg)  08/02/16 206 lb (93.4 kg)  07/14/16 204 lb (92.5 kg)    Current diet: well balanced    Lipid/Cholesterol, Follow-up:   Last seen for this6 months ago.  Management changes since that visit include no changes. . Last Lipid Panel:    Component Value Date/Time   CHOL 150 06/22/2016 1226   TRIG 125 06/22/2016 1226   HDL 47 06/22/2016 1226   CHOLHDL 3.2 06/22/2016 1226   LDLCALC 78 06/22/2016 1226    Risk factors for vascular disease include diabetes mellitus and hypertension  She reports good compliance with treatment. She is not having side effects.  Current symptoms include none and have been stable. Weight trend: stable Prior visit with dietician: no Current diet: well balanced Current exercise: none  Wt Readings from Last 3 Encounters:  09/21/16 206 lb 3.2 oz (93.5 kg)  08/02/16 206 lb (93.4 kg)  07/14/16 204 lb (92.5 kg)    Left leg wounds: She continues to have ulcerations develop in her legs. Has continued to apply silvadene dressings daily. Have recommended wound clinic referral on multiple occasions which she has refused. Is now having pain in legs and wondering what else can be done.   She reports leg swelling has improved since going up to 40mg  a day of furosemide per Dr. Saunders Revel, but she is extremely frustrated with QOL since she is having to urinate frequently  and loses urine every time she attempts to stand. Denies any pain or burning with urination, but unable to control bladder anymore.     Allergies  Allergen Reactions  . Hydrochlorothiazide     Leg Cramps  . Penicillins Rash     Current Outpatient Prescriptions:  .  ADVAIR HFA 115-21 MCG/ACT inhaler, Inhale 2 puffs into the lungs as needed. , Disp: , Rfl:  .  albuterol (PROAIR HFA) 108 (90 Base) MCG/ACT inhaler, Inhale 2 puffs into the lungs every 6 (six) hours as needed., Disp: 18 Inhaler, Rfl: 3 .  amLODipine (NORVASC) 5 MG tablet,  TAKE 1 TABLET (5 MG TOTAL) BY MOUTH DAILY., Disp: 30 tablet, Rfl: 5 .  aspirin EC 81 MG tablet, Take 1 tablet (81 mg total) by mouth daily., Disp: 90 tablet, Rfl: 3 .  ENTRESTO 49-51 MG, TAKE 1 TABLET BY MOUTH 2 (TWO) TIMES DAILY., Disp: 60 tablet, Rfl: 3 .  fluticasone furoate-vilanterol (BREO ELLIPTA) 200-25 MCG/INH AEPB, Inhale 1 puff into the lungs daily. FOR ASTHMA J45.20, Disp: 1 each, Rfl: 5 .  furosemide (LASIX) 40 MG tablet, Take 1 tablet (40 mg total) by mouth daily., Disp: 90 tablet, Rfl: 3 .  HYDROcodone-acetaminophen (NORCO) 7.5-325 MG tablet, Take 1 tablet by mouth every 6 (six) hours as needed for moderate pain. (Patient taking differently: Take 1 tablet by mouth every 6 (six) hours as needed for moderate pain. ), Disp: 60 tablet, Rfl: 0 .  levalbuterol (XOPENEX) 1.25 MG/3ML nebulizer solution, Take 1.25 mg by nebulization every 8 (eight) hours as needed for wheezing., Disp: 72 mL, Rfl: 3 .  lovastatin (MEVACOR) 20 MG tablet, Take 1 tablet (20 mg total) by mouth at bedtime., Disp: 90 tablet, Rfl: 4 .  meloxicam (MOBIC) 15 MG tablet, Take 1 tablet (15 mg total) by mouth daily as needed for pain. Take with food, Disp: 90 tablet, Rfl: 4 .  metFORMIN (GLUCOPHAGE-XR) 750 MG 24 hr tablet, Take 2 tablets by mouth daily., Disp: , Rfl: 4 .  metoprolol (LOPRESSOR) 50 MG tablet, Take 1 tablet (50 mg total) by mouth 2 (two) times daily., Disp: 180 tablet, Rfl: 3 .  montelukast (SINGULAIR) 10 MG tablet, Take 10 mg by mouth as needed., Disp: , Rfl: 4 .  potassium chloride (K-DUR) 10 MEQ tablet, Take 1 tablet (10 mEq total) by mouth daily., Disp: 30 tablet, Rfl: 3 .  silver sulfADIAZINE (SILVADENE) 1 % cream, APPLY TO AFFECTED AREA DAILY, Disp: 85 g, Rfl: 5 .  spironolactone (ALDACTONE) 25 MG tablet, TAKE 1 TABLET (25 MG TOTAL) BY MOUTH DAILY., Disp: 30 tablet, Rfl: 12 .  traZODone (DESYREL) 100 MG tablet, TAKE 1 TABLET BY MOUTH EVERY NIGHT AT BEDTIME AS NEEDED FOR INSOMNIA, Disp: 90 tablet, Rfl:  4  Review of Systems  Constitutional: Negative.   Respiratory: Negative.   Cardiovascular: Positive for leg swelling.  Endocrine: Negative.   Musculoskeletal: Positive for gait problem.       In a wheelchair    Social History  Substance Use Topics  . Smoking status: Never Smoker  . Smokeless tobacco: Never Used  . Alcohol use No   Objective:   BP    118/58 (BP Location: Left Arm)     Pulse  60     Temp    97.5 F (36.4 C) (Oral)     Ht  5\' 3"  (1.6 m)     Wt  206 lb 3.2 oz (93.5 kg)      BMI  36.53  kg/m       Physical Exam   General Appearance:    Alert, cooperative, no distress  Eyes:    PERRL, conjunctiva/corneas clear, EOM's intact       Lungs:     Clear to auscultation bilaterally, respirations unlabored  Heart:    Regular rate and rhythm  Neurologic:   Awake, alert, oriented x 3. No apparent focal neurological           defect.   Skin:   Several ulcerative lesions both anterior legs, with once large oozing lesion left anterior leg, considerably worse than last visit.    Results for orders placed or performed in visit on 09/21/16  POCT glycosylated hemoglobin (Hb A1C)  Result Value Ref Range   Hemoglobin A1C 7.7    Est. average glucose Bld gHb Est-mCnc 174        Assessment & Plan:     1. Diabetes mellitus with nephropathy (HCC) A1c is well controlled. Continue current medications.   - POCT glycosylated hemoglobin (Hb A1C)  2. Essential hypertension Stablle, Continue current medications.    3. Pure hypercholesterolemia She is tolerating lovastatin well with no adverse effects.    4. Congestive heart failure Considerable improvement on recent echo. Continue echo per cardiology.   5. Wound of left lower extremity, subsequent encounter  - AMB referral to wound care center - WOUND CULTURE  6. Polyria Cipro 500 twice daily for 7 days Urine Culture.        Lelon Huh, MD  [facility]

## 2016-09-21 NOTE — Patient Instructions (Signed)
[name] , Thank you for taking time to come for your Medicare Wellness Visit. I appreciate your ongoing commitment to your health goals. Please review the following plan we discussed and let me know if I can assist you in the future.   Screening recommendations/referrals: Colonoscopy: up to date Mammogram: pt to set up in 2019  Bone Density: up to date Recommended yearly ophthalmology/optometry visit for glaucoma screening and checkup Recommended yearly dental visit for hygiene and checkup  Vaccinations: Influenza vaccine: completed today Pneumococcal vaccine: completed series Tdap vaccine: declined Shingles vaccine: declined  Advanced directives: Advance directive discussed with you today. I have provided a copy for you to complete at home and have notarized. Once this is complete please bring a copy in to our office so we can scan it into your chart.  Conditions/risks identified: Fall risk prevention; Obesity; Recommend removing all area rugs from the home to prevent falls.   Next appointment: Today at 11:30 AM   Preventive Care 39 Years and Older, Female Preventive care refers to lifestyle choices and visits with your health care provider that can promote health and wellness. What does preventive care include?  A yearly physical exam. This is also called an annual well check.  Dental exams once or twice a year.  Routine eye exams. Ask your health care provider how often you should have your eyes checked.  Personal lifestyle choices, including:  Daily care of your teeth and gums.  Regular physical activity.  Eating a healthy diet.  Avoiding tobacco and drug use.  Limiting alcohol use.  Practicing safe sex.  Taking low-dose aspirin every day.  Taking vitamin and mineral supplements as recommended by your health care provider. What happens during an annual well check? The services and screenings done by your health care provider during your annual well check will  depend on your age, overall health, lifestyle risk factors, and family history of disease. Counseling  Your health care provider may ask you questions about your:  Alcohol use.  Tobacco use.  Drug use.  Emotional well-being.  Home and relationship well-being.  Sexual activity.  Eating habits.  History of falls.  Memory and ability to understand (cognition).  Work and work Statistician.  Reproductive health. Screening  You may have the following tests or measurements:  Height, weight, and BMI.  Blood pressure.  Lipid and cholesterol levels. These may be checked every 5 years, or more frequently if you are over 60 years old.  Skin check.  Lung cancer screening. You may have this screening every year starting at age 66 if you have a 30-pack-year history of smoking and currently smoke or have quit within the past 15 years.  Fecal occult blood test (FOBT) of the stool. You may have this test every year starting at age 70.  Flexible sigmoidoscopy or colonoscopy. You may have a sigmoidoscopy every 5 years or a colonoscopy every 10 years starting at age 62.  Hepatitis C blood test.  Hepatitis B blood test.  Sexually transmitted disease (STD) testing.  Diabetes screening. This is done by checking your blood sugar (glucose) after you have not eaten for a while (fasting). You may have this done every 1-3 years.  Bone density scan. This is done to screen for osteoporosis. You may have this done starting at age 102.  Mammogram. This may be done every 1-2 years. Talk to your health care provider about how often you should have regular mammograms. Talk with your health care provider about your  test results, treatment options, and if necessary, the need for more tests. Vaccines  Your health care provider may recommend certain vaccines, such as:  Influenza vaccine. This is recommended every year.  Tetanus, diphtheria, and acellular pertussis (Tdap, Td) vaccine. You may need a  Td booster every 10 years.  Zoster vaccine. You may need this after age 60.  Pneumococcal 13-valent conjugate (PCV13) vaccine. One dose is recommended after age 34.  Pneumococcal polysaccharide (PPSV23) vaccine. One dose is recommended after age 29. Talk to your health care provider about which screenings and vaccines you need and how often you need them. This information is not intended to replace advice given to you by your health care provider. Make sure you discuss any questions you have with your health care provider. Document Released: 01/15/2015 Document Revised: 09/08/2015 Document Reviewed: 10/20/2014 Elsevier Interactive Patient Education  2017 [place] Prevention in the Home Falls can cause injuries. They can happen to people of all ages. There are many things you can do to make your home safe and to help prevent falls. What can I do on the outside of my home?  Regularly fix the edges of walkways and driveways and fix any cracks.  Remove anything that might make you trip as you walk through a door, such as a raised step or threshold.  Trim any bushes or trees on the path to your home.  Use bright outdoor lighting.  Clear any walking paths of anything that might make someone trip, such as rocks or tools.  Regularly check to see if handrails are loose or broken. Make sure that both sides of any steps have handrails.  Any raised decks and porches should have guardrails on the edges.  Have any leaves, snow, or ice cleared regularly.  Use sand or salt on walking paths during winter.  Clean up any spills in your garage right away. This includes oil or grease spills. What can I do in the bathroom?  Use night lights.  Install grab bars by the toilet and in the tub and shower. Do not use towel bars as grab bars.  Use non-skid mats or decals in the tub or shower.  If you need to sit down in the shower, use a plastic, non-slip stool.  Keep the floor dry. Clean  up any water that spills on the floor as soon as it happens.  Remove soap buildup in the tub or shower regularly.  Attach bath mats securely with double-sided non-slip rug tape.  Do not have throw rugs and other things on the floor that can make you trip. What can I do in the bedroom?  Use night lights.  Make sure that you have a light by your bed that is easy to reach.  Do not use any sheets or blankets that are too big for your bed. They should not hang down onto the floor.  Have a firm chair that has side arms. You can use this for support while you get dressed.  Do not have throw rugs and other things on the floor that can make you trip. What can I do in the kitchen?  Clean up any spills right away.  Avoid walking on wet floors.  Keep items that you use a lot in easy-to-reach places.  If you need to reach something above you, use a strong step stool that has a grab bar.  Keep electrical cords out of the way.  Do not use floor polish or wax that  makes floors slippery. If you must use wax, use non-skid floor wax.  Do not have throw rugs and other things on the floor that can make you trip. What can I do with my stairs?  Do not leave any items on the stairs.  Make sure that there are handrails on both sides of the stairs and use them. Fix handrails that are broken or loose. Make sure that handrails are as long as the stairways.  Check any carpeting to make sure that it is firmly attached to the stairs. Fix any carpet that is loose or worn.  Avoid having throw rugs at the top or bottom of the stairs. If you do have throw rugs, attach them to the floor with carpet tape.  Make sure that you have a light switch at the top of the stairs and the bottom of the stairs. If you do not have them, ask someone to add them for you. What else can I do to help prevent falls?  Wear shoes that:  Do not have high heels.  Have rubber bottoms.  Are comfortable and fit you well.  Are  closed at the toe. Do not wear sandals.  If you use a stepladder:  Make sure that it is fully opened. Do not climb a closed stepladder.  Make sure that both sides of the stepladder are locked into place.  Ask someone to hold it for you, if possible.  Clearly mark and make sure that you can see:  Any grab bars or handrails.  First and last steps.  Where the edge of each step is.  Use tools that help you move around (mobility aids) if they are needed. These include:  Canes.  Walkers.  Scooters.  Crutches.  Turn on the lights when you go into a dark area. Replace any light bulbs as soon as they burn out.  Set up your furniture so you have a clear path. Avoid moving your furniture around.  If any of your floors are uneven, fix them.  If there are any pets around you, be aware of where they are.  Review your medicines with your doctor. Some medicines can make you feel dizzy. This can increase your chance of falling. Ask your doctor what other things that you can do to help prevent falls. This information is not intended to replace advice given to you by your health care provider. Make sure you discuss any questions you have with your health care provider. Document Released: 10/15/2008 Document Revised: 05/27/2015 Document Reviewed: 01/23/2014 Elsevier Interactive Patient Education  2017 [employer].

## 2016-09-22 LAB — HEPATITIS C ANTIBODY
HEP C AB: NONREACTIVE
SIGNAL TO CUT-OFF: 0.03 (ref <1.00)

## 2016-09-24 LAB — URINE CULTURE
MICRO NUMBER:: 81041672
SPECIMEN QUALITY:: ADEQUATE

## 2016-09-27 ENCOUNTER — Other Ambulatory Visit: Payer: Self-pay | Admitting: Family Medicine

## 2016-09-27 LAB — WOUND CULTURE
MICRO NUMBER: 81041669
SPECIMEN QUALITY:: ADEQUATE

## 2016-09-27 MED ORDER — CIPROFLOXACIN HCL 500 MG PO TABS: 500.0000 mg | ORAL_TABLET | Freq: Two times a day (BID) | ORAL | 0 refills | Status: AC

## 2016-10-02 ENCOUNTER — Encounter: Payer: Medicare Other | Attending: Surgery | Admitting: Surgery

## 2016-10-02 DIAGNOSIS — I509 Heart failure, unspecified: Secondary | ICD-10-CM | POA: Insufficient documentation

## 2016-10-02 DIAGNOSIS — I110 Hypertensive heart disease with heart failure: Secondary | ICD-10-CM | POA: Diagnosis not present

## 2016-10-02 DIAGNOSIS — L97829 Non-pressure chronic ulcer of other part of left lower leg with unspecified severity: Secondary | ICD-10-CM | POA: Diagnosis not present

## 2016-10-02 DIAGNOSIS — Z79899 Other long term (current) drug therapy: Secondary | ICD-10-CM | POA: Insufficient documentation

## 2016-10-02 DIAGNOSIS — J45909 Unspecified asthma, uncomplicated: Secondary | ICD-10-CM | POA: Diagnosis not present

## 2016-10-02 DIAGNOSIS — L97229 Non-pressure chronic ulcer of left calf with unspecified severity: Secondary | ICD-10-CM | POA: Diagnosis not present

## 2016-10-02 DIAGNOSIS — Z7984 Long term (current) use of oral hypoglycemic drugs: Secondary | ICD-10-CM | POA: Insufficient documentation

## 2016-10-02 DIAGNOSIS — L97212 Non-pressure chronic ulcer of right calf with fat layer exposed: Secondary | ICD-10-CM | POA: Diagnosis not present

## 2016-10-02 DIAGNOSIS — E11622 Type 2 diabetes mellitus with other skin ulcer: Secondary | ICD-10-CM | POA: Diagnosis not present

## 2016-10-02 DIAGNOSIS — I429 Cardiomyopathy, unspecified: Secondary | ICD-10-CM | POA: Insufficient documentation

## 2016-10-02 DIAGNOSIS — Z880 Allergy status to penicillin: Secondary | ICD-10-CM | POA: Diagnosis not present

## 2016-10-02 DIAGNOSIS — I890 Lymphedema, not elsewhere classified: Secondary | ICD-10-CM | POA: Diagnosis not present

## 2016-10-02 DIAGNOSIS — I272 Pulmonary hypertension, unspecified: Secondary | ICD-10-CM | POA: Insufficient documentation

## 2016-10-02 DIAGNOSIS — I2510 Atherosclerotic heart disease of native coronary artery without angina pectoris: Secondary | ICD-10-CM | POA: Insufficient documentation

## 2016-10-02 DIAGNOSIS — L97222 Non-pressure chronic ulcer of left calf with fat layer exposed: Secondary | ICD-10-CM | POA: Insufficient documentation

## 2016-10-02 DIAGNOSIS — M1990 Unspecified osteoarthritis, unspecified site: Secondary | ICD-10-CM | POA: Diagnosis not present

## 2016-10-02 DIAGNOSIS — E119 Type 2 diabetes mellitus without complications: Secondary | ICD-10-CM | POA: Diagnosis not present

## 2016-10-02 DIAGNOSIS — L97812 Non-pressure chronic ulcer of other part of right lower leg with fat layer exposed: Secondary | ICD-10-CM | POA: Diagnosis not present

## 2016-10-02 NOTE — Progress Notes (Addendum)
KEIRY, KOWAL ([id number]) Visit Report for 10/02/2016 Chief Complaint Document Details Patient Name: [name], [name]. Date of Service: 10/02/2016 10:30 AM Medical Record Number: [id number] Patient Account Number: [id number] Date of Birth/Sex: 02-Jun-1945 (71 y.o. Female) Treating RN: Ahmed Prima Primary Care Provider: Lelon Huh Other Clinician: Referring Provider: Lelon Huh Treating Provider/Extender: Frann Rider in Treatment: 0 Information Obtained from: Patient Chief Complaint Patient seen for complaints of Non-Healing Wounds to both lower extremities which she's had for about 2 months with swelling. Electronic Signature(s) Signed: 10/02/2016 11:48:47 AM By: Christin Fudge MD, FACS Entered By: Christin Fudge on 10/02/2016 11:48:46 [name] ([id number]) -------------------------------------------------------------------------------- HPI Details Patient Name: [name], [name] Date of Service: 10/02/2016 10:30 AM Medical Record Number: [id number] Patient Account Number: [id number] Date of Birth/Sex: May 16, 1945 (71 y.o. Female) Treating RN: Ahmed Prima Primary Care Provider: Lelon Huh Other Clinician: Referring Provider: Lelon Huh Treating Provider/Extender: Frann Rider in Treatment: 0 History of Present Illness Location: both lower extremity swelling with ulceration Quality: Patient reports experiencing a sharp pain to affected area(s). Severity: Patient states wound are getting worse. Duration: Patient has had the wound for > 2 months prior to seeking treatment at the wound center Timing: Pain in wound is constant (hurts all the time) and is worse when she elevates her legs Context: The wound would happen gradually Modifying Factors: Other treatment(s) tried include:she has been on ciprofloxacin and has been applying Silvadene ointment Associated Signs and Symptoms: Patient reports having increase swelling. HPI Description: 71 year old  patient who sees her PCP Dr. Lelon Huh was recently evaluated 10 days ago for diabetes mellitus, hypertension, CHF and hyperlipidemia. she also was noted to have ulcerations develop in her legs and she has been applying Silvadene dressings locally. In the past she has refused wound care referrals.her cardiologist Dr. Saunders Revel saw her and put her on 40 mg of furosemide daily. last hemoglobin A1c was 7.7%. she was also placed on ciprofloxacin twice daily for 7 days and a urine culture was recommended. past medical history significant for coronary artery disease, diabetes mellitus, nonischemic cardiomyopathy and pulmonary hypertension. She is also status post heart catheterization and coronary angiography, tubal ligation and breast cyst removal in the past. She has never been a smoker. Patient had arterial studies done which showed bilateral ABIs are artificially elevated due to noncompressible and calcified vessels. Triphasic waveform throughout. Right great toe TBI is elevated while the left is normal. Electronic Signature(s) Signed: 10/02/2016 11:49:59 AM By: Christin Fudge MD, FACS Previous Signature: 10/02/2016 11:22:37 AM Version By: Christin Fudge MD, FACS Previous Signature: 10/02/2016 10:29:42 AM Version By: Christin Fudge MD, FACS Previous Signature: 10/02/2016 10:18:32 AM Version By: Christin Fudge MD, FACS Previous Signature: 10/02/2016 10:11:00 AM Version By: Christin Fudge MD, FACS Previous Signature: 10/02/2016 10:09:26 AM Version By: Christin Fudge MD, FACS Entered By: Christin Fudge on 10/02/2016 11:49:58 [name] ([id number]) -------------------------------------------------------------------------------- Physical Exam Details Patient Name: [name], [name] Date of Service: 10/02/2016 10:30 AM Medical Record Number: [id number] Patient Account Number: [id number] Date of Birth/Sex: January 21, 1945 (71 y.o. Female) Treating RN: Ahmed Prima Primary Care Provider: Lelon Huh Other  Clinician: Referring Provider: Lelon Huh Treating Provider/Extender: Frann Rider in Treatment: 0 Constitutional . Pulse regular. Respirations normal and unlabored. Afebrile. . Eyes Nonicteric. Reactive to light. Ears, Nose, Mouth, and Throat Lips, teeth, and gums WNL.Marland Kitchen Moist mucosa without lesions. Neck supple and nontender. No palpable supraclavicular or cervical adenopathy. Normal sized without goiter. Respiratory WNL. No retractions.. Breath sounds WNL, No rubs,  rales, rhonchi, or wheeze.. Cardiovascular Heart rhythm and rate regular, no murmur or gallop.. Pedal Pulses WNL. ABIs were noncompressible but the flow was triphasic done on a arterial study recently. patient has bilateral lymphedema with weeping of ulcerations. Chest Breasts symmetical and no nipple discharge.. Breast tissue WNL, no masses, lumps, or tenderness.. Gastrointestinal (GI) Abdomen without masses or tenderness.. No liver or spleen enlargement or tenderness.. Lymphatic No adneopathy. No adenopathy. No adenopathy. Musculoskeletal Adexa without tenderness or enlargement.. Digits and nails w/o clubbing, cyanosis, infection, petechiae, ischemia, or inflammatory conditions.. Integumentary (Hair, Skin) No suspicious lesions. No crepitus or fluctuance. No peri-wound warmth or erythema. No masses.Marland Kitchen Psychiatric Judgement and insight Intact.. No evidence of depression, anxiety, or agitation.. Notes bilateral lymphedema with ulceration on the right lower extremity which is looking fairly clean and does not need any sharp debridement. On the left lower extremity she has significant amount of subcutaneous debris which is too tender to debride sharply. She will need Santyl ointment locally Electronic Signature(s) Signed: 10/02/2016 11:51:02 AM By: Christin Fudge MD, FACS [name] ([id number]) Entered By: Christin Fudge on 10/02/2016 11:51:01 [name]  ([id number]) -------------------------------------------------------------------------------- Physician Orders Details Patient Name: [name], [name] Date of Service: 10/02/2016 10:30 AM Medical Record Number: [id number] Patient Account Number: [id number] Date of Birth/Sex: 05-Apr-1945 (71 y.o. Female) Treating RN: Ahmed Prima Primary Care Provider: Lelon Huh Other Clinician: Referring Provider: Lelon Huh Treating Provider/Extender: Frann Rider in Treatment: 0 Verbal / Phone Orders: Yes ClinicianCarolyne Fiscal, Debi Read Back and Verified: Yes Diagnosis Coding Wound Cleansing Wound #1 Left,Lateral Lower Leg o Clean wound with Normal Saline. o Cleanse wound with mild soap and water o May Shower, gently pat wound dry prior to applying new dressing. Wound #2 Left,Anterior Lower Leg o Clean wound with Normal Saline. o Cleanse wound with mild soap and water o May Shower, gently pat wound dry prior to applying new dressing. Wound #3 Left,Medial Lower Leg o Clean wound with Normal Saline. o Cleanse wound with mild soap and water o May Shower, gently pat wound dry prior to applying new dressing. Wound #4 Right,Anterior Lower Leg o Clean wound with Normal Saline. o Cleanse wound with mild soap and water o May Shower, gently pat wound dry prior to applying new dressing. Wound #5 Right,Medial Lower Leg o Clean wound with Normal Saline. o Cleanse wound with mild soap and water o May Shower, gently pat wound dry prior to applying new dressing. Wound #6 Right,Lateral Lower Leg o Clean wound with Normal Saline. o Cleanse wound with mild soap and water o May Shower, gently pat wound dry prior to applying new dressing. Anesthetic Wound #1 Left,Lateral Lower Leg o Topical Lidocaine 4% cream applied to wound bed prior to debridement - for clinic use Wound #2 Left,Anterior Lower Leg o Topical Lidocaine 4% cream applied to wound bed prior  to debridement - for clinic use CHARRIE, MCCONNON ([id number]) Wound #3 Left,Medial Lower Leg o Topical Lidocaine 4% cream applied to wound bed prior to debridement - for clinic use Wound #4 Right,Anterior Lower Leg o Topical Lidocaine 4% cream applied to wound bed prior to debridement - for clinic use Wound #5 Right,Medial Lower Leg o Topical Lidocaine 4% cream applied to wound bed prior to debridement - for clinic use Wound #6 Right,Lateral Lower Leg o Topical Lidocaine 4% cream applied to wound bed prior to debridement - for clinic use Primary Wound Dressing Wound #1 Left,Lateral Lower Leg o Santyl Ointment Wound #2 Left,Anterior Lower Leg   o Santyl Ointment Wound #3 Left,Medial Lower Leg o Santyl Ointment Wound #4 Right,Anterior Lower Leg o Silvercel Non-Adherent Wound #5 Right,Medial Lower Leg o Silvercel Non-Adherent Wound #6 Right,Lateral Lower Leg o Silvercel Non-Adherent Secondary Dressing Wound #1 Left,Lateral Lower Leg o ABD pad o Dry Gauze o XtraSorb Wound #2 Left,Anterior Lower Leg o ABD pad o Dry Gauze o XtraSorb Wound #3 Left,Medial Lower Leg o ABD pad o Dry Gauze o XtraSorb Wound #4 Right,Anterior Lower Leg LEZLEY, BEDGOOD B. ([id number]) o ABD pad o Dry Gauze o XtraSorb Wound #5 Right,Medial Lower Leg o ABD pad o Dry Gauze o XtraSorb Wound #6 Right,Lateral Lower Leg o ABD pad o Dry Gauze o XtraSorb Dressing Change Frequency Wound #1 Left,Lateral Lower Leg o Change Dressing Monday, Wednesday, Friday - HHRN to change Wednesday and Fridays and pt to come to [place] on Mondays. Wound #2 Left,Anterior Lower Leg o Change Dressing Monday, Wednesday, Friday - HHRN to change Wednesday and Fridays and pt to come to [place] on Mondays. Wound #3 Left,Medial Lower Leg o Change Dressing Monday, Wednesday, Friday - HHRN to change Wednesday and Fridays and pt to come to [place] on Mondays. Wound #4 Right,Anterior Lower Leg o Change Dressing Monday, Wednesday, Friday - HHRN to change Wednesday and Fridays and pt to come to [place] on Mondays. Wound #5 Right,Medial Lower Leg o Change Dressing Monday, Wednesday, Friday - HHRN to change Wednesday and Fridays and pt to come to [place] on Mondays. Wound #6 Right,Lateral Lower Leg o Change Dressing Monday, Wednesday, Friday - HHRN to change Wednesday and Fridays and pt to come to [place] on Mondays. Follow-up Appointments Wound #1 Left,Lateral Lower Leg o Return Appointment in 1 week. Wound #2 Left,Anterior Lower Leg o Return Appointment in 1 week. Wound #3 Left,Medial Lower Leg o Return Appointment in 1 week. MANIKA, HAST ([id number]) Wound #4 Right,Anterior Lower Leg o Return Appointment in 1 week. Wound #5 Right,Medial Lower Leg o Return Appointment in 1 week. Wound #6 Right,Lateral Lower Leg o Return Appointment in 1 week. Edema Control Wound #1 Left,Lateral Lower Leg o Kerlix and Coban - Bilateral - unna to anchor o Elevate legs to the level of the heart and pump ankles as often as possible Wound #2 Left,Anterior Lower Leg o Kerlix and Coban - Bilateral - unna to anchor o Elevate legs to the level of the heart and pump ankles as often as possible Wound #3 Left,Medial Lower Leg o Kerlix and Coban - Bilateral - unna to anchor o Elevate legs to the level of the heart and pump ankles as often as possible Wound #4 Right,Anterior Lower Leg o Kerlix and Coban - Bilateral - unna to anchor o Elevate legs to the level of the heart and pump ankles as often as possible Wound #5 Right,Medial Lower Leg o Kerlix and Coban - Bilateral - unna to anchor o Elevate legs to the level of the heart and pump ankles as often as possible Wound #6 Right,Lateral Lower Leg o Kerlix and Coban - Bilateral - unna to anchor o Elevate legs to the level  of the heart and pump ankles as often as possible Additional Orders / Instructions Wound #1 Left,Lateral Lower Leg o Increase protein intake. Wound #2 Left,Anterior Lower Leg o Increase protein intake. Wound #3 Left,Medial Lower Leg o Increase protein intake. Wound #4 Right,Anterior Lower Leg o Increase protein intake. Wound #5 Right,Medial Lower Leg Gewirtz, [place] (  [id number]) o Increase protein intake. Wound #6 Right,Lateral Lower Leg o Increase protein intake. Home Health Wound #1 [place] for [place] Nurse may visit PRN to address patientos wound care needs. o FACE TO FACE ENCOUNTER: MEDICARE and MEDICAID PATIENTS: I certify that this patient is under my care and that I had a face-to-face encounter that meets the physician face-to-face encounter requirements with this patient on this date. The encounter with the patient was in whole or in part for the following MEDICAL CONDITION: (primary reason for [place]) MEDICAL NECESSITY: I certify, that based on my findings, NURSING services are a medically necessary home health service. HOME BOUND STATUS: I certify that my clinical findings support that this patient is homebound (i.e., Due to illness or injury, pt requires aid of supportive devices such as crutches, cane, wheelchairs, walkers, the use of special transportation or the assistance of another person to leave their place of residence. There is a normal inability to leave the home and doing so requires considerable and taxing effort. Other absences are for medical reasons / religious services and are infrequent or of short duration when for other reasons). o If current dressing causes regression in wound condition, may D/C ordered dressing product/s and apply Normal Saline Moist Dressing daily until next [place] / Other MD appointment. [place] of regression in  wound condition at [phone]. o Please direct any NON-WOUND related issues/requests for orders to patient's Primary Care Physician Wound #2 [place] for [place] Nurse may visit PRN to address patientos wound care needs. o FACE TO FACE ENCOUNTER: MEDICARE and MEDICAID PATIENTS: I certify that this patient is under my care and that I had a face-to-face encounter that meets the physician face-to-face encounter requirements with this patient on this date. The encounter with the patient was in whole or in part for the following MEDICAL CONDITION: (primary reason for [place]) MEDICAL NECESSITY: I certify, that based on my findings, NURSING services are a medically necessary home health service. HOME BOUND STATUS: I certify that my clinical findings support that this patient is homebound (i.e., Due to illness or injury, pt requires aid of supportive devices such as crutches, cane, wheelchairs, walkers, the use of special transportation or the assistance of another person to leave their place of residence. There is a normal inability to leave the home and doing so requires considerable and taxing effort. Other absences are for medical reasons / religious services and are infrequent or of short duration when for other reasons). o If current dressing causes regression in wound condition, may D/C ordered dressing product/s and apply Normal Saline Moist Dressing daily until next [place] / Other MD appointment. [place] of regression in wound condition at [phone]. o Please direct any NON-WOUND related issues/requests for orders to patient's Primary Care Physician Wound #3 [place] for [place], Kaira B. ([id number]) o [place] Nurse may visit PRN to address patientos wound care needs. o FACE TO FACE ENCOUNTER: MEDICARE and  MEDICAID PATIENTS: I certify that this patient is under my care and that I had a face-to-face encounter that meets the physician face-to-face encounter requirements with this patient on this date. The encounter with the patient was in whole or in part for the following MEDICAL CONDITION: (primary reason for [place]) MEDICAL NECESSITY: I certify, that based  on my findings, NURSING services are a medically necessary home health service. HOME BOUND STATUS: I certify that my clinical findings support that this patient is homebound (i.e., Due to illness or injury, pt requires aid of supportive devices such as crutches, cane, wheelchairs, walkers, the use of special transportation or the assistance of another person to leave their place of residence. There is a normal inability to leave the home and doing so requires considerable and taxing effort. Other absences are for medical reasons / religious services and are infrequent or of short duration when for other reasons). o If current dressing causes regression in wound condition, may D/C ordered dressing product/s and apply Normal Saline Moist Dressing daily until next [place] / Other MD appointment. [place] of regression in wound condition at [phone]. o Please direct any NON-WOUND related issues/requests for orders to patient's Primary Care Physician Wound #4 [place] for [place] Nurse may visit PRN to address patientos wound care needs. o FACE TO FACE ENCOUNTER: MEDICARE and MEDICAID PATIENTS: I certify that this patient is under my care and that I had a face-to-face encounter that meets the physician face-to-face encounter requirements with this patient on this date. The encounter with the patient was in whole or in part for the following MEDICAL CONDITION: (primary reason for [place]) MEDICAL NECESSITY: I certify, that  based on my findings, NURSING services are a medically necessary home health service. HOME BOUND STATUS: I certify that my clinical findings support that this patient is homebound (i.e., Due to illness or injury, pt requires aid of supportive devices such as crutches, cane, wheelchairs, walkers, the use of special transportation or the assistance of another person to leave their place of residence. There is a normal inability to leave the home and doing so requires considerable and taxing effort. Other absences are for medical reasons / religious services and are infrequent or of short duration when for other reasons). o If current dressing causes regression in wound condition, may D/C ordered dressing product/s and apply Normal Saline Moist Dressing daily until next [place] / Other MD appointment. [place] of regression in wound condition at [phone]. o Please direct any NON-WOUND related issues/requests for orders to patient's Primary Care Physician Wound #5 [place] for [place] Nurse may visit PRN to address patientos wound care needs. o FACE TO FACE ENCOUNTER: MEDICARE and MEDICAID PATIENTS: I certify that this patient is under my care and that I had a face-to-face encounter that meets the physician face-to-face encounter requirements with this patient on this date. The encounter with the patient was in whole or in part for the following MEDICAL CONDITION: (primary reason for [place]) MEDICAL NECESSITY: I certify, that based on my findings, NURSING services are a medically necessary home health service. HOME BOUND STATUS: I certify that my clinical findings support that this patient is homebound (i.e., Due to illness or injury, pt requires aid of BRIANNI, MANTHE. ([id number]) supportive devices such as crutches, cane, wheelchairs, walkers, the use of special transportation or  the assistance of another person to leave their place of residence. There is a normal inability to leave the home and doing so requires considerable and taxing effort. Other absences are for medical reasons / religious services and are infrequent or of short duration when for other reasons). o If current dressing causes regression in  wound condition, may D/C ordered dressing product/s and apply Normal Saline Moist Dressing daily until next [place] / Other MD appointment. [place] of regression in wound condition at [phone]. o Please direct any NON-WOUND related issues/requests for orders to patient's Primary Care Physician Wound #6 [place] for [place] Nurse may visit PRN to address patientos wound care needs. o FACE TO FACE ENCOUNTER: MEDICARE and MEDICAID PATIENTS: I certify that this patient is under my care and that I had a face-to-face encounter that meets the physician face-to-face encounter requirements with this patient on this date. The encounter with the patient was in whole or in part for the following MEDICAL CONDITION: (primary reason for [place]) MEDICAL NECESSITY: I certify, that based on my findings, NURSING services are a medically necessary home health service. HOME BOUND STATUS: I certify that my clinical findings support that this patient is homebound (i.e., Due to illness or injury, pt requires aid of supportive devices such as crutches, cane, wheelchairs, walkers, the use of special transportation or the assistance of another person to leave their place of residence. There is a normal inability to leave the home and doing so requires considerable and taxing effort. Other absences are for medical reasons / religious services and are infrequent or of short duration when for other reasons). o If current dressing causes regression in wound condition, may D/C  ordered dressing product/s and apply Normal Saline Moist Dressing daily until next [place] / Other MD appointment. [place] of regression in wound condition at [phone]. o Please direct any NON-WOUND related issues/requests for orders to patient's Primary Care Physician Medications-please add to medication list. Wound #1 Left,Lateral Lower Leg o Santyl Enzymatic Ointment o Other: - Vitamin C, Zinc, Multivitamin Wound #2 Left,Anterior Lower Leg o Santyl Enzymatic Ointment o Other: - Vitamin C, Zinc, Multivitamin Wound #3 Left,Medial Lower Leg o Santyl Enzymatic Ointment o Other: - Vitamin C, Zinc, Multivitamin Wound #4 Right,Anterior Lower Leg o Other: - Vitamin C, Zinc, Multivitamin Wound #5 Right,Medial Lower Leg LIZBET, CIRRINCIONE ([id number]) o Other: - Vitamin C, Zinc, Multivitamin Wound #6 Right,Lateral Lower Leg o Other: - Vitamin C, Zinc, Multivitamin Patient Medications Allergies: penicillin, hydrochlorothiazide Notifications Medication Indication Start End Santyl 10/02/2016 DOSE topical 250 unit/gram ointment - ointment topical as directed Electronic Signature(s) Signed: 10/02/2016 4:37:28 PM By: Christin Fudge MD, FACS Signed: 10/02/2016 5:16:33 PM By: Alric Quan Previous Signature: 10/02/2016 11:36:34 AM Version By: Christin Fudge MD, FACS Entered By: Alric Quan on 10/02/2016 11:56:33 [name] ([id number]) -------------------------------------------------------------------------------- Problem List Details Patient Name: JOZY, MCPHEARSON B. Date of Service: 10/02/2016 10:30 AM Medical Record Number: [id number] Patient Account Number: [id number] Date of Birth/Sex: [birth year] (71 y.o. Female) Treating RN: Ahmed Prima Primary Care Provider: Lelon Huh Other Clinician: Referring Provider: Lelon Huh Treating Provider/Extender: Frann Rider in Treatment: 0 Active  Problems ICD-10 Encounter Code Description Active Date Diagnosis E11.622 Type 2 diabetes mellitus with other skin ulcer 10/02/2016 Yes I89.0 Lymphedema, not elsewhere classified 10/02/2016 Yes L97.212 Non-pressure chronic ulcer of right calf with fat layer 10/02/2016 Yes exposed L97.222 Non-pressure chronic ulcer of left calf with fat layer 10/02/2016 Yes exposed Inactive Problems Resolved Problems Electronic Signature(s) Signed: 10/02/2016 11:48:11 AM By: Christin Fudge MD, FACS Entered By: Christin Fudge on 10/02/2016 11:48:11 [name] ([id number]) -------------------------------------------------------------------------------- Progress Note Details Patient Name: [name] Date of Service: 10/02/2016 10:30 AM Medical Record Number: [id number]  Patient Account Number: [id number] Date of Birth/Sex: Jan 10, 1945 (71 y.o. Female) Treating RN: Ahmed Prima Primary Care Provider: Lelon Huh Other Clinician: Referring Provider: Lelon Huh Treating Provider/Extender: Frann Rider in Treatment: 0 Subjective Chief Complaint Information obtained from Patient Patient seen for complaints of Non-Healing Wounds to both lower extremities which she's had for about 2 months with swelling. History of Present Illness (HPI) The following HPI elements were documented for the patient's wound: Location: both lower extremity swelling with ulceration Quality: Patient reports experiencing a sharp pain to affected area(s). Severity: Patient states wound are getting worse. Duration: Patient has had the wound for > 2 months prior to seeking treatment at the wound center Timing: Pain in wound is constant (hurts all the time) and is worse when she elevates her legs Context: The wound would happen gradually Modifying Factors: Other treatment(s) tried include:she has been on ciprofloxacin and has been applying Silvadene ointment Associated Signs and Symptoms: Patient reports having  increase swelling. 71 year old patient who sees her PCP Dr. Lelon Huh was recently evaluated 10 days ago for diabetes mellitus, hypertension, CHF and hyperlipidemia. she also was noted to have ulcerations develop in her legs and she has been applying Silvadene dressings locally. In the past she has refused wound care referrals.her cardiologist Dr. Saunders Revel saw her and put her on 40 mg of furosemide daily. last hemoglobin A1c was 7.7%. she was also placed on ciprofloxacin twice daily for 7 days and a urine culture was recommended. past medical history significant for coronary artery disease, diabetes mellitus, nonischemic cardiomyopathy and pulmonary hypertension. She is also status post heart catheterization and coronary angiography, tubal ligation and breast cyst removal in the past. She has never been a smoker. Patient had arterial studies done which showed bilateral ABIs are artificially elevated due to noncompressible and calcified vessels. Triphasic waveform throughout. Right great toe TBI is elevated while the left is normal. Wound History Patient presents with 6 open wounds that have been present for approximately 2 months. Patient has been treating wounds in the following manner: silvadene. Laboratory tests have not been performed in the last month. Patient reportedly has not tested positive for an antibiotic resistant organism. Patient reportedly has not tested positive for osteomyelitis. Patient reportedly has had testing performed to evaluate circulation in the legs. Patient experiences the following problems associated with their wounds: infection, swelling. SEYNABOU, FULTS ([id number]) Patient History Information obtained from Patient. Allergies penicillin, hydrochlorothiazide Family History Cancer - Mother,Siblings, Diabetes - Mother,Siblings, Heart Disease - Mother, Hypertension - Siblings, No family history of Hereditary Spherocytosis, Kidney Disease, Lung Disease, Seizures,  Stroke, Thyroid Problems, Tuberculosis. Social History Never smoker, Marital Status - Married, Alcohol Use - Never, Drug Use - No History, Caffeine Use - Daily. Medical History Respiratory Patient has history of Asthma Cardiovascular Patient has history of Congestive Heart Failure, Coronary Artery Disease, Hypertension Endocrine Patient has history of Type II Diabetes Musculoskeletal Patient has history of Osteoarthritis Patient is treated with Oral Agents. Blood sugar is not tested. Review of Systems (ROS) Constitutional Symptoms (General Health) The patient has no complaints or symptoms. Eyes Complains or has symptoms of Glasses / Contacts. Ear/Nose/Mouth/Throat The patient has no complaints or symptoms. Hematologic/Lymphatic The patient has no complaints or symptoms. Cardiovascular hx nonischemic cardiomyopathy pulmonary HTN aortic stenosis Gastrointestinal The patient has no complaints or symptoms. Genitourinary The patient has no complaints or symptoms. Immunological The patient has no complaints or symptoms. Integumentary (Skin) The patient has no complaints or symptoms. Neurologic The patient has no  complaints or symptoms. Oncologic stage II multiple myeloma ELIANI, LECLERE ([id number]) Medications Entresto 49 mg-51 mg tablet oral 1 1 tablet oral two times daily metformin ER 750 mg tablet,extended release 24 hr oral 2 2 tablet extended release 24 hr oral daily lovastatin 20 mg tablet oral 1 1 tablet oral nightly albuterol sulfate HFA 90 mcg/actuation aerosol inhaler inhalation 2 2 HFA aerosol inhaler inhalations every six hours as needed levalbuterol 1.25 mg/3 mL solution for nebulization inhalation 1 1 solution for nebulization inhalation every eight hours as needed Advair HFA 115 mcg-21 mcg/actuation aerosol inhaler inhalation 2 2 HFA aerosol inhaler inhalations as needed Breo Ellipta 200 mcg-25 mcg/dose powder for inhalation inhalation 1 1 blister with device  inhalation daily metoprolol tartrate 50 mg tablet oral 1 1 tablet oral two times daily amlodipine 5 mg tablet oral 1 1 tablet oral daily montelukast 10 mg tablet oral 1 1 tablet oral as needed furosemide 40 mg tablet oral 1 1 tablet oral daily hydrocodone 10 mg-acetaminophen 325 mg tablet oral 1 1 tablet oral every six hours as needed meloxicam 15 mg tablet oral 1 1 tablet oral daily as needed aspirin 81 mg tablet,delayed release oral 1 1 tablet,delayed release (DR/EC) oral daily potassium chloride ER 10 mEq tablet,extended release oral 1 1 tablet extended release oral daily spironolactone 25 mg tablet oral 1 1 tablet oral daily Cipro 500 mg tablet oral 1 1 tablet oral two times daily trazodone 100 mg tablet oral 1 1 tablet oral nightly as needed silver sulfadiazine 1 % topical cream topical cream topical one application daily Santyl 250 unit/gram topical ointment topical ointment topical as directed Objective Constitutional Pulse regular. Respirations normal and unlabored. Afebrile. Vitals Time Taken: 10:26 AM, Height: 64 in, Source: Stated, Weight: 206 lbs, Source: Measured, BMI: 35.4, Pulse: 62 bpm, Respiratory Rate: 16 breaths/min, Blood Pressure: 132/68 mmHg. Eyes Nonicteric. Reactive to light. Ears, Nose, Mouth, and Throat Lips, teeth, and gums WNL.Marland Kitchen Moist mucosa without lesions. LARNA, CAPELLE ([id number]) Neck supple and nontender. No palpable supraclavicular or cervical adenopathy. Normal sized without goiter. Respiratory WNL. No retractions.. Breath sounds WNL, No rubs, rales, rhonchi, or wheeze.. Cardiovascular Heart rhythm and rate regular, no murmur or gallop.. Pedal Pulses WNL. ABIs were noncompressible but the flow was triphasic done on a arterial study recently. patient has bilateral lymphedema with weeping of ulcerations. Chest Breasts symmetical and no nipple discharge.. Breast tissue WNL, no masses, lumps, or tenderness.. Gastrointestinal (GI) Abdomen without  masses or tenderness.. No liver or spleen enlargement or tenderness.. Lymphatic No adneopathy. No adenopathy. No adenopathy. Musculoskeletal Adexa without tenderness or enlargement.. Digits and nails w/o clubbing, cyanosis, infection, petechiae, ischemia, or inflammatory conditions.Marland Kitchen Psychiatric Judgement and insight Intact.. No evidence of depression, anxiety, or agitation.. General Notes: bilateral lymphedema with ulceration on the right lower extremity which is looking fairly clean and does not need any sharp debridement. On the left lower extremity she has significant amount of subcutaneous debris which is too tender to debride sharply. She will need Santyl ointment locally Integumentary (Hair, Skin) No suspicious lesions. No crepitus or fluctuance. No peri-wound warmth or erythema. No masses.. Wound #1 status is Open. Original cause of wound was Gradually Appeared. The wound is located on the Left,Lateral Lower Leg. The wound measures 5.5cm length x 4cm width x 0.1cm depth; 17.279cm^2 area and 1.728cm^3 volume. There is no tunneling or undermining noted. There is a large amount of serous drainage noted. The wound margin is distinct with the outline attached to the wound  base. There is no granulation within the wound bed. There is a large (67-100%) amount of necrotic tissue within the wound bed including Eschar and Adherent Slough. The periwound skin appearance exhibited: Maceration, Erythema. The surrounding wound skin color is noted with erythema which is circumferential. Periwound temperature was noted as No Abnormality. Wound #2 status is Open. Original cause of wound was Gradually Appeared. The wound is located on the Left,Anterior Lower Leg. The wound measures 4cm length x 2.5cm width x 0.1cm depth; 7.854cm^2 area and 0.785cm^3 volume. There is no tunneling or undermining noted. There is a large amount of serous drainage noted. The wound margin is distinct with the outline attached  to the wound base. There is no granulation within the wound bed. There is a large (67-100%) amount of necrotic tissue within the wound bed including Adherent Slough. The periwound skin appearance exhibited: Maceration, Erythema. The [place] ([id number]) surrounding wound skin color is noted with erythema which is circumferential. Periwound temperature was noted as No Abnormality. The periwound has tenderness on palpation. Wound #3 status is Open. Original cause of wound was Gradually Appeared. The wound is located on the Left,Medial Lower Leg. The wound measures 3.5cm length x 2cm width x 0.1cm depth; 5.498cm^2 area and 0.55cm^3 volume. There is no tunneling or undermining noted. There is a large amount of serous drainage noted. The wound margin is distinct with the outline attached to the wound base. There is no granulation within the wound bed. There is a large (67-100%) amount of necrotic tissue within the wound bed including Eschar and Adherent Slough. The periwound skin appearance exhibited: Maceration, Erythema. The surrounding wound skin color is noted with erythema which is circumferential. Periwound temperature was noted as No Abnormality. The periwound has tenderness on palpation. Wound #4 status is Open. Original cause of wound was Gradually Appeared. The wound is located on the Right,Anterior Lower Leg. The wound measures 0.5cm length x 0.4cm width x 0.1cm depth; 0.157cm^2 area and 0.016cm^3 volume. There is no tunneling or undermining noted. There is a large amount of serous drainage noted. The wound margin is distinct with the outline attached to the wound base. There is large (67-100%) red granulation within the wound bed. There is a small (1-33%) amount of necrotic tissue within the wound bed including Adherent Slough. The periwound skin appearance exhibited: Erythema. The surrounding wound skin color is noted with erythema which is circumferential. Periwound temperature  was noted as No Abnormality. The periwound has tenderness on palpation. Wound #5 status is Open. Original cause of wound was Gradually Appeared. The wound is located on the Right,Medial Lower Leg. The wound measures 0.3cm length x 0.5cm width x 0.1cm depth; 0.118cm^2 area and 0.012cm^3 volume. There is no tunneling or undermining noted. There is a large amount of serous drainage noted. The wound margin is distinct with the outline attached to the wound base. There is large (67-100%) red granulation within the wound bed. There is no necrotic tissue within the wound bed. The periwound skin appearance exhibited: Maceration, Erythema. The surrounding wound skin color is noted with erythema which is circumferential. Wound #6 status is Open. Original cause of wound was Gradually Appeared. The wound is located on the Right,Lateral Lower Leg. The wound measures 5cm length x 1.6cm width x 0.1cm depth; 6.283cm^2 area and 0.628cm^3 volume. There is no tunneling or undermining noted. There is a large amount of serous drainage noted. The wound margin is distinct with the outline attached to the wound base. There is  large (67-100%) red granulation within the wound bed. There is no necrotic tissue within the wound bed. The periwound skin appearance exhibited: Maceration, Erythema. The surrounding wound skin color is noted with erythema which is circumferential. Periwound temperature was noted as No Abnormality. The periwound has tenderness on palpation. Assessment Active Problems ICD-10 E11.622 - Type 2 diabetes mellitus with other skin ulcer I89.0 - Lymphedema, not elsewhere classified L97.212 - Non-pressure chronic ulcer of right calf with fat layer exposed L97.222 - Non-pressure chronic ulcer of left calf with fat layer exposed ANAHLIA, ISEMINGER ([id number]) 71 year old diabetic who has fairly well-controlled diabetes mellitus but has bilateral lower extremity edema and ulceration possibly due to CHF and  other associated problems. She has not been elevating her legs and in fact has been sitting all day and even at night has her legs in the dependent position. After review today and noting that she had triphasic flow on arterial duplex studies, I have recommended: 1. Silver alginate to the right lower extremity and Santyl ointment to the left lower extremity to be wrapped with Kerlix and Coban to be changed 3 times a week 2. Elevation and exercise discussed with her in great detail 3. Adequate protein, vitamin A, vitamin C and zinc 4. Regular visits to the wound center. she and her husband have read all questions answered and will be agreeable to the treatment plan Plan Wound Cleansing: Wound #1 Left,Lateral Lower Leg: Clean wound with Normal Saline. Cleanse wound with mild soap and water May Shower, gently pat wound dry prior to applying new dressing. Wound #2 Left,Anterior Lower Leg: Clean wound with Normal Saline. Cleanse wound with mild soap and water May Shower, gently pat wound dry prior to applying new dressing. Wound #3 Left,Medial Lower Leg: Clean wound with Normal Saline. Cleanse wound with mild soap and water May Shower, gently pat wound dry prior to applying new dressing. Wound #4 Right,Anterior Lower Leg: Clean wound with Normal Saline. Cleanse wound with mild soap and water May Shower, gently pat wound dry prior to applying new dressing. Wound #5 Right,Medial Lower Leg: Clean wound with Normal Saline. Cleanse wound with mild soap and water May Shower, gently pat wound dry prior to applying new dressing. Wound #6 Right,Lateral Lower Leg: Clean wound with Normal Saline. Cleanse wound with mild soap and water May Shower, gently pat wound dry prior to applying new dressing. Anesthetic: Wound #1 Left,Lateral Lower Leg: AVEREY, KONING ([id number]) Topical Lidocaine 4% cream applied to wound bed prior to debridement - for clinic use Wound #2 Left,Anterior Lower  Leg: Topical Lidocaine 4% cream applied to wound bed prior to debridement - for clinic use Wound #3 Left,Medial Lower Leg: Topical Lidocaine 4% cream applied to wound bed prior to debridement - for clinic use Wound #4 Right,Anterior Lower Leg: Topical Lidocaine 4% cream applied to wound bed prior to debridement - for clinic use Wound #5 Right,Medial Lower Leg: Topical Lidocaine 4% cream applied to wound bed prior to debridement - for clinic use Wound #6 Right,Lateral Lower Leg: Topical Lidocaine 4% cream applied to wound bed prior to debridement - for clinic use Primary Wound Dressing: Wound #1 Left,Lateral Lower Leg: Santyl Ointment Wound #2 Left,Anterior Lower Leg: Santyl Ointment Wound #3 Left,Medial Lower Leg: Santyl Ointment Wound #4 Right,Anterior Lower Leg: Silvercel Non-Adherent Wound #5 Right,Medial Lower Leg: Silvercel Non-Adherent Wound #6 Right,Lateral Lower Leg: Silvercel Non-Adherent Secondary Dressing: Wound #1 Left,Lateral Lower Leg: ABD pad Dry Gauze XtraSorb Wound #2 Left,Anterior Lower Leg: ABD pad Dry  Gauze XtraSorb Wound #3 Left,Medial Lower Leg: ABD pad Dry Gauze XtraSorb Wound #4 Right,Anterior Lower Leg: ABD pad Dry Gauze XtraSorb Wound #5 Right,Medial Lower Leg: ABD pad Dry Gauze XtraSorb Wound #6 Right,Lateral Lower Leg: ABD pad Dry Gauze XtraSorb Dressing Change Frequency: Wound #1 Left,Lateral Lower Leg: AMARISE, LILLO ([id number]) Change Dressing Monday, Wednesday, Friday - HHRN to change Wednesday and Fridays and pt to come to [place] on Mondays. Wound #2 Left,Anterior Lower Leg: Change Dressing Monday, Wednesday, Friday - HHRN to change Wednesday and Fridays and pt to come to [place] on Mondays. Wound #3 Left,Medial Lower Leg: Change Dressing Monday, Wednesday, Friday - HHRN to change Wednesday and Fridays and pt to come to [place] on Mondays. Wound #4 Right,Anterior Lower Leg: Change Dressing  Monday, Wednesday, Friday - HHRN to change Wednesday and Fridays and pt to come to [place] on Mondays. Wound #5 Right,Medial Lower Leg: Change Dressing Monday, Wednesday, Friday - HHRN to change Wednesday and Fridays and pt to come to [place] on Mondays. Wound #6 Right,Lateral Lower Leg: Change Dressing Monday, Wednesday, Friday - HHRN to change Wednesday and Fridays and pt to come to [place] on Mondays. Follow-up Appointments: Wound #1 Left,Lateral Lower Leg: Return Appointment in 1 week. Wound #2 Left,Anterior Lower Leg: Return Appointment in 1 week. Wound #3 Left,Medial Lower Leg: Return Appointment in 1 week. Wound #4 Right,Anterior Lower Leg: Return Appointment in 1 week. Wound #5 Right,Medial Lower Leg: Return Appointment in 1 week. Wound #6 Right,Lateral Lower Leg: Return Appointment in 1 week. Edema Control: Wound #1 Left,Lateral Lower Leg: Kerlix and Coban - Bilateral - unna to anchor Elevate legs to the level of the heart and pump ankles as often as possible Wound #2 Left,Anterior Lower Leg: Kerlix and Coban - Bilateral - unna to anchor Elevate legs to the level of the heart and pump ankles as often as possible Wound #3 Left,Medial Lower Leg: Kerlix and Coban - Bilateral - unna to anchor Elevate legs to the level of the heart and pump ankles as often as possible Wound #4 Right,Anterior Lower Leg: Kerlix and Coban - Bilateral - unna to anchor Elevate legs to the level of the heart and pump ankles as often as possible Wound #5 Right,Medial Lower Leg: Kerlix and Coban - Bilateral - unna to anchor Elevate legs to the level of the heart and pump ankles as often as possible Wound #6 Right,Lateral Lower Leg: Kerlix and Coban - Bilateral - unna to anchor Elevate legs to the level of the heart and pump ankles as often as possible Additional Orders / Instructions: Wound #1 Left,Lateral Lower Leg: GABRYELLA, MURFIN B. ([id number]) Increase protein  intake. Wound #2 Left,Anterior Lower Leg: Increase protein intake. Wound #3 Left,Medial Lower Leg: Increase protein intake. Wound #4 Right,Anterior Lower Leg: Increase protein intake. Wound #5 Right,Medial Lower Leg: Increase protein intake. Wound #6 Right,Lateral Lower Leg: Increase protein intake. Home Health: Wound #1 Left,Lateral Lower Leg: [place] for [place] Nurse may visit PRN to address patient s wound care needs. FACE TO FACE ENCOUNTER: MEDICARE and MEDICAID PATIENTS: I certify that this patient is under my care and that I had a face-to-face encounter that meets the physician face-to-face encounter requirements with this patient on this date. The encounter with the patient was in whole or in part for the following MEDICAL CONDITION: (primary reason for [place]) MEDICAL NECESSITY: I certify, that based on my findings,  NURSING services are a medically necessary home health service. HOME BOUND STATUS: I certify that my clinical findings support that this patient is homebound (i.e., Due to illness or injury, pt requires aid of supportive devices such as crutches, cane, wheelchairs, walkers, the use of special transportation or the assistance of another person to leave their place of residence. There is a normal inability to leave the home and doing so requires considerable and taxing effort. Other absences are for medical reasons / religious services and are infrequent or of short duration when for other reasons). If current dressing causes regression in wound condition, may D/C ordered dressing product/s and apply Normal Saline Moist Dressing daily until next [place] / Other MD appointment. [place] of regression in wound condition at [phone]. Please direct any NON-WOUND related issues/requests for orders to patient's Primary Care Physician Wound #2 Left,Anterior Lower Leg: [place] for  [place] Nurse may visit PRN to address patient s wound care needs. FACE TO FACE ENCOUNTER: MEDICARE and MEDICAID PATIENTS: I certify that this patient is under my care and that I had a face-to-face encounter that meets the physician face-to-face encounter requirements with this patient on this date. The encounter with the patient was in whole or in part for the following MEDICAL CONDITION: (primary reason for [place]) MEDICAL NECESSITY: I certify, that based on my findings, NURSING services are a medically necessary home health service. HOME BOUND STATUS: I certify that my clinical findings support that this patient is homebound (i.e., Due to illness or injury, pt requires aid of supportive devices such as crutches, cane, wheelchairs, walkers, the use of special transportation or the assistance of another person to leave their place of residence. There is a normal inability to leave the home and doing so requires considerable and taxing effort. Other absences are for medical reasons / religious services and are infrequent or of short duration when for other reasons). If current dressing causes regression in wound condition, may D/C ordered dressing product/s and apply Normal Saline Moist Dressing daily until next [place] / Other MD appointment. [place] of regression in wound condition at [phone]. Please direct any NON-WOUND related issues/requests for orders to patient's Primary Care Physician Wound #3 Left,Medial Lower Leg: [place] for [place] Nurse may visit PRN to address patient s wound care needs. FACE TO FACE ENCOUNTER: MEDICARE and MEDICAID PATIENTS: I certify that this patient is under my care and that I had a face-to-face encounter that meets the physician face-to-face encounter MAHEEN, CWIKLA ([id number]) requirements with this patient on this date. The encounter with the patient was in  whole or in part for the following MEDICAL CONDITION: (primary reason for [place]) MEDICAL NECESSITY: I certify, that based on my findings, NURSING services are a medically necessary home health service. HOME BOUND STATUS: I certify that my clinical findings support that this patient is homebound (i.e., Due to illness or injury, pt requires aid of supportive devices such as crutches, cane, wheelchairs, walkers, the use of special transportation or the assistance of another person to leave their place of residence. There is a normal inability to leave the home and doing so requires considerable and taxing effort. Other absences are for medical reasons / religious services and are infrequent or of short duration when for other reasons). If current dressing causes regression in wound condition, may D/C ordered dressing product/s and apply Normal Saline Moist  Dressing daily until next [place] / Other MD appointment. [place] of regression in wound condition at [phone]. Please direct any NON-WOUND related issues/requests for orders to patient's Primary Care Physician Wound #4 Right,Anterior Lower Leg: [place] for [place] Nurse may visit PRN to address patient s wound care needs. FACE TO FACE ENCOUNTER: MEDICARE and MEDICAID PATIENTS: I certify that this patient is under my care and that I had a face-to-face encounter that meets the physician face-to-face encounter requirements with this patient on this date. The encounter with the patient was in whole or in part for the following MEDICAL CONDITION: (primary reason for [place]) MEDICAL NECESSITY: I certify, that based on my findings, NURSING services are a medically necessary home health service. HOME BOUND STATUS: I certify that my clinical findings support that this patient is homebound (i.e., Due to illness or injury, pt requires aid of supportive devices such as  crutches, cane, wheelchairs, walkers, the use of special transportation or the assistance of another person to leave their place of residence. There is a normal inability to leave the home and doing so requires considerable and taxing effort. Other absences are for medical reasons / religious services and are infrequent or of short duration when for other reasons). If current dressing causes regression in wound condition, may D/C ordered dressing product/s and apply Normal Saline Moist Dressing daily until next [place] / Other MD appointment. [place] of regression in wound condition at [phone]. Please direct any NON-WOUND related issues/requests for orders to patient's Primary Care Physician Wound #5 Right,Medial Lower Leg: [place] for [place] Nurse may visit PRN to address patient s wound care needs. FACE TO FACE ENCOUNTER: MEDICARE and MEDICAID PATIENTS: I certify that this patient is under my care and that I had a face-to-face encounter that meets the physician face-to-face encounter requirements with this patient on this date. The encounter with the patient was in whole or in part for the following MEDICAL CONDITION: (primary reason for [place]) MEDICAL NECESSITY: I certify, that based on my findings, NURSING services are a medically necessary home health service. HOME BOUND STATUS: I certify that my clinical findings support that this patient is homebound (i.e., Due to illness or injury, pt requires aid of supportive devices such as crutches, cane, wheelchairs, walkers, the use of special transportation or the assistance of another person to leave their place of residence. There is a normal inability to leave the home and doing so requires considerable and taxing effort. Other absences are for medical reasons / religious services and are infrequent or of short duration when for other reasons). If current dressing  causes regression in wound condition, may D/C ordered dressing product/s and apply Normal Saline Moist Dressing daily until next [place] / Other MD appointment. [place] of regression in wound condition at [phone]. Please direct any NON-WOUND related issues/requests for orders to patient's Primary Care Physician Wound #6 Right,Lateral Lower Leg: [place] for [place] Nurse may visit PRN to address patient s wound care needs. FACE TO FACE ENCOUNTER: MEDICARE and MEDICAID PATIENTS: I certify that this patient is under my care and that I had a face-to-face encounter that meets the physician face-to-face encounter TZIVIA, ONEIL ([id number]) requirements with this patient on this date. The encounter with the patient was in whole or in part for the following MEDICAL CONDITION: (primary reason  for Home Healthcare) MEDICAL NECESSITY: I certify, that based on my findings, NURSING services are a medically necessary home health service. HOME BOUND STATUS: I certify that my clinical findings support that this patient is homebound (i.e., Due to illness or injury, pt requires aid of supportive devices such as crutches, cane, wheelchairs, walkers, the use of special transportation or the assistance of another person to leave their place of residence. There is a normal inability to leave the home and doing so requires considerable and taxing effort. Other absences are for medical reasons / religious services and are infrequent or of short duration when for other reasons). If current dressing causes regression in wound condition, may D/C ordered dressing product/s and apply Normal Saline Moist Dressing daily until next [place] / Other MD appointment. [place] of regression in wound condition at [phone]. Please direct any NON-WOUND related issues/requests for orders to patient's Primary Care  Physician Medications-please add to medication list.: Wound #1 Left,Lateral Lower Leg: Santyl Enzymatic Ointment Other: - Vitamin C, Zinc, Multivitamin Wound #2 Left,Anterior Lower Leg: Santyl Enzymatic Ointment Other: - Vitamin C, Zinc, Multivitamin Wound #3 Left,Medial Lower Leg: Santyl Enzymatic Ointment Other: - Vitamin C, Zinc, Multivitamin Wound #4 Right,Anterior Lower Leg: Other: - Vitamin C, Zinc, Multivitamin Wound #5 Right,Medial Lower Leg: Other: - Vitamin C, Zinc, Multivitamin Wound #6 Right,Lateral Lower Leg: Other: - Vitamin C, Zinc, Multivitamin The following medication(s) was prescribed: Santyl topical 250 unit/gram ointment ointment topical as directed starting 10/02/2016 71 year old diabetic who has fairly well-controlled diabetes mellitus but has bilateral lower extremity edema and ulceration possibly due to CHF and other associated problems. She has not been elevating her legs and in fact has been sitting all day and even at night has her legs in the dependent position. After review today and noting that she had triphasic flow on arterial duplex studies, I have recommended: 1. Silver alginate to the right lower extremity and Santyl ointment to the left lower extremity to be wrapped with Kerlix and Coban to be changed 3 times a week 2. Elevation and exercise discussed with her in great detail 3. Adequate protein, vitamin A, vitamin C and zinc 4. Regular visits to the wound center. she and her husband have read all questions answered and will be agreeable to the treatment plan JAKYA, DOVIDIO ([id number]) Electronic Signature(s) Signed: 10/02/2016 2:44:55 PM By: Christin Fudge MD, FACS Previous Signature: 10/02/2016 11:53:31 AM Version By: Christin Fudge MD, FACS Entered By: Christin Fudge on 10/02/2016 14:44:55 [name] ([id number]) -------------------------------------------------------------------------------- ROS/PFSH Details Patient Name: FIA, HEBERT  B. Date of Service: 10/02/2016 10:30 AM Medical Record Number: [id number] Patient Account Number: [id number] Date of Birth/Sex: [birth year] (71 y.o. Female) Treating RN: Carolyne Fiscal, Debi Primary Care Provider: Lelon Huh Other Clinician: Referring Provider: Lelon Huh Treating Provider/Extender: Frann Rider in Treatment: 0 Information Obtained From Patient Wound History Do you currently have one or more open woundso Yes How many open wounds do you currently haveo 6 Approximately how long have you had your woundso 2 months How have you been treating your wound(s) until nowo [place] Has your wound(s) ever healed and then re-openedo No Have you had any lab work done in the past montho No Have you tested positive for an antibiotic resistant organism (MRSA, VRE)o No Have you tested positive for osteomyelitis (bone infection)o No Have you had any tests for circulation on your legso Yes Where was the test doneo [facility] Have you had other problems associated with your  woundso Infection, Swelling Eyes Complaints and Symptoms: Positive for: Glasses / Contacts Constitutional Symptoms (General Health) Complaints and Symptoms: No Complaints or Symptoms Ear/Nose/Mouth/Throat Complaints and Symptoms: No Complaints or Symptoms Hematologic/Lymphatic Complaints and Symptoms: No Complaints or Symptoms Respiratory Medical History: Positive for: Asthma Cardiovascular CRETA, DORAME ([id number]) Complaints and Symptoms: Review of System Notes: hx nonischemic cardiomyopathy pulmonary HTN aortic stenosis Medical History: Positive for: Congestive Heart Failure; Coronary Artery Disease; Hypertension Gastrointestinal Complaints and Symptoms: No Complaints or Symptoms Endocrine Medical History: Positive for: Type II Diabetes Time with diabetes: a long time Treated with: Oral agents Blood sugar tested every day: No Genitourinary Complaints and Symptoms: No Complaints or  Symptoms Immunological Complaints and Symptoms: No Complaints or Symptoms Integumentary (Skin) Complaints and Symptoms: No Complaints or Symptoms Musculoskeletal Medical History: Positive for: Osteoarthritis Neurologic Complaints and Symptoms: No Complaints or Symptoms Oncologic Complaints and Symptoms: Review of System Notes: SELETHA, ZIMMERMANN ([id number]) stage II multiple myeloma Immunizations Pneumococcal Vaccine: Received Pneumococcal Vaccination: Yes Implantable Devices Family and Social History Cancer: Yes - Mother,Siblings; Diabetes: Yes - Mother,Siblings; Heart Disease: Yes - Mother; Hereditary Spherocytosis: No; Hypertension: Yes - Siblings; Kidney Disease: No; Lung Disease: No; Seizures: No; Stroke: No; Thyroid Problems: No; Tuberculosis: No; Never smoker; Marital Status - Married; Alcohol Use: Never; Drug Use: No History; Caffeine Use: Daily; Financial Concerns: No; Food, Clothing or Shelter Needs: No; Support System Lacking: No; Transportation Concerns: No; Advanced Directives: No; Patient does not want information on Advanced Directives; Do not resuscitate: No; Living Will: No; Medical Power of Attorney: No Physician Affirmation I have reviewed and agree with the above information. Electronic Signature(s) Signed: 10/02/2016 4:37:28 PM By: Christin Fudge MD, FACS Signed: 10/02/2016 5:16:33 PM By: Alric Quan Entered By: Alric Quan on 10/02/2016 11:44:29 [name] ([id number]) -------------------------------------------------------------------------------- SuperBill Details Patient Name: JAQUELYN, SAKAMOTO B. Date of Service: 10/02/2016 Medical Record Number: [id number] Patient Account Number: [id number] Date of Birth/Sex: Nov 03, 1945 (71 y.o. Female) Treating RN: Carolyne Fiscal, Debi Primary Care Provider: Lelon Huh Other Clinician: Referring Provider: Lelon Huh Treating Provider/Extender: Frann Rider in Treatment: 0 Diagnosis Coding ICD-10  Codes Code Description E11.622 Type 2 diabetes mellitus with other skin ulcer I89.0 Lymphedema, not elsewhere classified L97.212 Non-pressure chronic ulcer of right calf with fat layer exposed L97.222 Non-pressure chronic ulcer of left calf with fat layer exposed Facility Procedures CPT4 Code: 00923300 Description: [id number] - WOUND CARE VISIT-LEV 5 EST PT Modifier: Quantity: 1 Physician Procedures CPT4 Code: 3335456 Description: 25638 - WC PHYS LEVEL 4 - NEW PT ICD-10 Description Diagnosis E11.622 Type 2 diabetes mellitus with other skin ulcer I89.0 Lymphedema, not elsewhere classified L97.222 Non-pressure chronic ulcer of left calf with fat L97.212 Non-pressure  chronic ulcer of right calf with fat Modifier: layer exposed layer exposed Quantity: 1 Electronic Signature(s) Signed: 10/02/2016 2:45:05 PM By: Christin Fudge MD, FACS Previous Signature: 10/02/2016 11:54:43 AM Version By: Christin Fudge MD, FACS Entered By: Christin Fudge on 10/02/2016 14:45:05

## 2016-10-03 NOTE — Progress Notes (Signed)
ONEISHA, AMMONS ([id number]) Visit Report for 10/02/2016 Abuse/Suicide Risk Screen Details Patient Name: [name], [name]. Date of Service: 10/02/2016 10:30 AM Medical Record Number: [id number] Patient Account Number: [id number] Date of Birth/Sex: Jun 01, 1945 (71 y.o. Female) Treating RN: Ahmed Prima Primary Care Mane Consolo: Lelon Huh Other Clinician: Referring Jamichael Knotts: Lelon Huh Treating Rhina Kramme/Extender: Frann Rider in Treatment: 0 Abuse/Suicide Risk Screen Items Answer ABUSE/SUICIDE RISK SCREEN: Has anyone close to you tried to hurt or harm you recentlyo No Do you feel uncomfortable with anyone in your familyo No Has anyone forced you do things that you didnot want to doo No Do you have any thoughts of harming yourselfo No Patient displays signs or symptoms of abuse and/or neglect. No Electronic Signature(s) Signed: 10/02/2016 5:16:33 PM By: Alric Quan Entered By: Alric Quan on 10/02/2016 10:33:36 [name] ([id number]) -------------------------------------------------------------------------------- Activities of Daily Living Details Patient Name: [name], [name] Date of Service: 10/02/2016 10:30 AM Medical Record Number: [id number] Patient Account Number: [id number] Date of Birth/Sex: [birth year] (71 y.o. Female) Treating RN: Ahmed Prima Primary Care [place] Stanger: Lelon Huh Other Clinician: Referring Ulis Kaps: Lelon Huh Treating Tylia Ewell/Extender: Frann Rider in Treatment: 0 Activities of Daily Living Items Answer Activities of Daily Living (Please select one for each item) Drive Automobile Not Able Take Medications Need Assistance Use Telephone Completely Able Care for Appearance Completely Able Use Toilet Completely Able Bath / Shower Completely Able Dress Self Completely Able Feed Self Completely Able Walk Need Assistance Get In / Out Bed Need Assistance Housework Not Able Prepare Meals Not Able Handle Money Need  Assistance Shop for Self Not Able Electronic Signature(s) Signed: 10/02/2016 5:16:33 PM By: Alric Quan Entered By: Alric Quan on 10/02/2016 10:34:23 [name] ([id number]) -------------------------------------------------------------------------------- Education Assessment Details Patient Name: [name] Date of Service: 10/02/2016 10:30 AM Medical Record Number: [id number] Patient Account Number: [id number] Date of Birth/Sex: 06-Apr-1945 (71 y.o. Female) Treating RN: Ahmed Prima Primary Care Zanya Lindo: Lelon Huh Other Clinician: Referring Joyanna Kleman: Lelon Huh Treating Hilliary Jock/Extender: Frann Rider in Treatment: 0 Primary Learner Assessed: Patient Learning Preferences/Education Level/Primary Language Learning Preference: Explanation, Printed Material Highest Education Level: High School Preferred Language: English Cognitive Barrier Assessment/Beliefs Language Barrier: No Translator Needed: No Memory Deficit: No Emotional Barrier: No Cultural/Religious Beliefs Affecting Medical No Care: Physical Barrier Assessment Impaired Vision: Yes Glasses Impaired Hearing: No Decreased Hand dexterity: No Knowledge/Comprehension Assessment Knowledge Level: Medium Comprehension Level: Medium Ability to understand written Medium instructions: Ability to understand verbal Medium instructions: Motivation Assessment Anxiety Level: Calm Cooperation: Cooperative Education Importance: Acknowledges Need Interest in Health Problems: Asks Questions Perception: Coherent Willingness to Engage in Self- Medium Management Activities: Readiness to Engage in Self- Medium Management Activities: Electronic Signature(s) BRICELYN, FREESTONE ([id number]) Signed: 10/02/2016 5:16:33 PM By: Alric Quan Entered By: Alric Quan on 10/02/2016 10:34:40 [name]  ([id number]) -------------------------------------------------------------------------------- Fall Risk Assessment Details Patient Name: [name]. Date of Service: 10/02/2016 10:30 AM Medical Record Number: [id number] Patient Account Number: [id number] Date of Birth/Sex: 06-12-1945 (71 y.o. Female) Treating RN: Carolyne Fiscal, Debi Primary Care Kristan Brummitt: Lelon Huh Other Clinician: Referring Jostin Rue: Lelon Huh Treating Antonetta Clanton/Extender: Frann Rider in Treatment: 0 Fall Risk Assessment Items Have you had 2 or more falls in the last 12 monthso 0 Yes Have you had any fall that resulted in injury in the last 12 monthso 0 No FALL RISK ASSESSMENT: History of falling - immediate or within 3 months 25 Yes Secondary diagnosis 15 Yes Ambulatory aid None/bed rest/wheelchair/nurse 0 Yes Crutches/cane/walker 15 Yes  Furniture 0 No IV Access/Saline Lock 0 No Gait/Training Normal/bed rest/immobile 0 No Weak 10 Yes Impaired 20 Yes Mental Status Oriented to own ability 0 Yes Electronic Signature(s) Signed: 10/02/2016 5:16:33 PM By: Alric Quan Entered By: Alric Quan on 10/02/2016 10:35:06 [name] ([id number]) -------------------------------------------------------------------------------- Foot Assessment Details Patient Name: Jillian Albe B. Date of Service: 10/02/2016 10:30 AM Medical Record Number: [id number] Patient Account Number: [id number] Date of Birth/Sex: [birth year] (71 y.o. Female) Treating RN: Ahmed Prima Primary Care Nilam Quakenbush: Lelon Huh Other Clinician: Referring Raeley Gilmore: Lelon Huh Treating Alizaya Oshea/Extender: Frann Rider in Treatment: 0 Foot Assessment Items Site Locations + = Sensation present, - = Sensation absent, C = Callus, U = Ulcer R = Redness, W = Warmth, M = Maceration, PU = Pre-ulcerative lesion F = Fissure, S = Swelling, D = Dryness Assessment Right: Left: Other Deformity: No No Prior Foot Ulcer: No  No Prior Amputation: No No Charcot Joint: No No Ambulatory Status: Ambulatory With Help Assistance Device: Wheelchair Gait: Steady Electronic Signature(s) Signed: 10/02/2016 5:16:33 PM By: Alric Quan Entered By: Alric Quan on 10/02/2016 10:40:03 [name] ([id number]) -------------------------------------------------------------------------------- Nutrition Risk Assessment Details Patient Name: Jillian Albe B. Date of Service: 10/02/2016 10:30 AM Medical Record Number: [id number] Patient Account Number: [id number] Date of Birth/Sex: 1945/12/26 (71 y.o. Female) Treating RN: Carolyne Fiscal, Debi Primary Care Gennell How: Lelon Huh Other Clinician: Referring Meyli Boice: Lelon Huh Treating Reagyn Facemire/Extender: Frann Rider in Treatment: 0 Height (in): 64 Weight (lbs): 206 Body Mass Index (BMI): 35.4 Nutrition Risk Assessment Items NUTRITION RISK SCREEN: I have an illness or condition that made me change the kind and/or 0 No amount of food I eat I eat fewer than two meals per day 0 No I eat few fruits and vegetables, or milk products 0 No I have three or more drinks of beer, liquor or wine almost every day 0 No I have tooth or mouth problems that make it hard for me to eat 0 No I don't always have enough money to buy the food I need 0 No I eat alone most of the time 0 No I take three or more different prescribed or over-the-counter drugs a 1 Yes day Without wanting to, I have lost or gained 10 pounds in the last six 0 No months I am not always physically able to shop, cook and/or feed myself 2 Yes Nutrition Protocols Good Risk Protocol Moderate Risk Protocol Electronic Signature(s) Signed: 10/02/2016 5:16:33 PM By: Alric Quan Entered By: Alric Quan on 10/02/2016 10:35:31

## 2016-10-04 DIAGNOSIS — E11622 Type 2 diabetes mellitus with other skin ulcer: Secondary | ICD-10-CM | POA: Diagnosis not present

## 2016-10-04 DIAGNOSIS — Z4800 Encounter for change or removal of nonsurgical wound dressing: Secondary | ICD-10-CM | POA: Diagnosis not present

## 2016-10-04 DIAGNOSIS — M1990 Unspecified osteoarthritis, unspecified site: Secondary | ICD-10-CM | POA: Diagnosis not present

## 2016-10-04 DIAGNOSIS — L97211 Non-pressure chronic ulcer of right calf limited to breakdown of skin: Secondary | ICD-10-CM | POA: Diagnosis not present

## 2016-10-04 DIAGNOSIS — I2510 Atherosclerotic heart disease of native coronary artery without angina pectoris: Secondary | ICD-10-CM | POA: Diagnosis not present

## 2016-10-04 DIAGNOSIS — L97212 Non-pressure chronic ulcer of right calf with fat layer exposed: Secondary | ICD-10-CM | POA: Diagnosis not present

## 2016-10-04 DIAGNOSIS — I509 Heart failure, unspecified: Secondary | ICD-10-CM | POA: Diagnosis not present

## 2016-10-04 DIAGNOSIS — Z7984 Long term (current) use of oral hypoglycemic drugs: Secondary | ICD-10-CM | POA: Diagnosis not present

## 2016-10-04 DIAGNOSIS — Z79891 Long term (current) use of opiate analgesic: Secondary | ICD-10-CM | POA: Diagnosis not present

## 2016-10-04 DIAGNOSIS — I429 Cardiomyopathy, unspecified: Secondary | ICD-10-CM | POA: Diagnosis not present

## 2016-10-04 DIAGNOSIS — J45909 Unspecified asthma, uncomplicated: Secondary | ICD-10-CM | POA: Diagnosis not present

## 2016-10-04 DIAGNOSIS — L97222 Non-pressure chronic ulcer of left calf with fat layer exposed: Secondary | ICD-10-CM | POA: Diagnosis not present

## 2016-10-04 DIAGNOSIS — I272 Pulmonary hypertension, unspecified: Secondary | ICD-10-CM | POA: Diagnosis not present

## 2016-10-04 DIAGNOSIS — I110 Hypertensive heart disease with heart failure: Secondary | ICD-10-CM | POA: Diagnosis not present

## 2016-10-05 NOTE — Progress Notes (Signed)
LAJOYA, DOMBEK ([id number]) Visit Report for 10/02/2016 Allergy List Details Patient Name: [name], [name]. Date of Service: 10/02/2016 10:30 AM Medical Record Number: [id number] Patient Account Number: [id number] Date of Birth/Sex: 10-09-1945 (71 y.o. Female) Treating RN: Ahmed Prima Primary Care Tyana Butzer: Lelon Huh Other Clinician: Referring Chrisopher Pustejovsky: Lelon Huh Treating Kele Withem/Extender: Frann Rider in Treatment: 0 Allergies Active Allergies penicillin hydrochlorothiazide Allergy Notes Electronic Signature(s) Signed: 10/02/2016 5:16:33 PM By: Alric Quan Entered By: Alric Quan on 10/02/2016 10:29:47 [name] ([id number]) -------------------------------------------------------------------------------- Arrival Information Details Patient Name: [name] Date of Service: 10/02/2016 10:30 AM Medical Record Number: [id number] Patient Account Number: [id number] Date of Birth/Sex: November 23, 1945 (71 y.o. Female) Treating RN: Ahmed Prima Primary Care Mickel Schreur: Lelon Huh Other Clinician: Referring Winner Valeriano: Lelon Huh Treating Duyen Beckom/Extender: Frann Rider in Treatment: 0 Visit Information Patient Arrived: Wheel Chair Arrival Time: 10:23 Accompanied By: husband Transfer Assistance: EasyPivot Patient Lift Patient Identification Verified: Yes Secondary Verification Process Yes Completed: Patient Requires Transmission- No Based Precautions: Patient Has Alerts: Yes Patient Alerts: R ABI 1.8 L ABI 1.0 DM II Electronic Signature(s) Signed: 10/02/2016 5:16:33 PM By: Alric Quan Entered By: Alric Quan on 10/02/2016 10:26:29 [name] ([id number]) -------------------------------------------------------------------------------- Clinic Level of Care Assessment Details Patient Name: [name]. Date of Service: 10/02/2016 10:30 AM Medical Record Number: [id number] Patient Account Number: [id number] Date of  Birth/Sex: January 30, 1945 (71 y.o. Female) Treating RN: Ahmed Prima Primary Care Emir Nack: Lelon Huh Other Clinician: Referring Harlean Regula: Lelon Huh Treating Rawlin Reaume/Extender: Frann Rider in Treatment: 0 Clinic Level of Care Assessment Items TOOL 2 Quantity Score X - Use when only an EandM is performed on the INITIAL visit 1 0 ASSESSMENTS - Nursing Assessment / Reassessment X - General Physical Exam (combine w/ comprehensive assessment (listed just 1 20 below) when performed on new pt. evals) X - Comprehensive Assessment (HX, ROS, Risk Assessments, Wounds Hx, etc.) 1 25 ASSESSMENTS - Wound and Skin Assessment / Reassessment []  - Simple Wound Assessment / Reassessment - one wound 0 X - Complex Wound Assessment / Reassessment - multiple wounds 6 5 []  - Dermatologic / Skin Assessment (not related to wound area) 0 ASSESSMENTS - Ostomy and/or Continence Assessment and Care []  - Incontinence Assessment and Management 0 []  - Ostomy Care Assessment and Management (repouching, etc.) 0 PROCESS - Coordination of Care []  - Simple Patient / Family Education for ongoing care 0 X - Complex (extensive) Patient / Family Education for ongoing care 1 20 X - Staff obtains Programmer, systems, Records, Test Results / Process Orders 1 10 X - Staff telephones HHA, Nursing Homes / Clarify orders / etc 1 10 []  - Routine Transfer to another Facility (non-emergent condition) 0 []  - Routine Hospital Admission (non-emergent condition) 0 X - New Admissions / Biomedical engineer / Ordering NPWT, Apligraf, etc. 1 15 []  - Emergency Hospital Admission (emergent condition) 0 X - Simple Discharge Coordination 1 10 [name], Jillian B. ([id number]) []  - Complex (extensive) Discharge Coordination 0 PROCESS - Special Needs []  - Pediatric / Minor Patient Management 0 []  - Isolation Patient Management 0 []  - Hearing / Language / Visual special needs 0 []  - Assessment of Community assistance (transportation, D/C  planning, etc.) 0 []  - Additional assistance / Altered mentation 0 []  - Support Surface(s) Assessment (bed, cushion, seat, etc.) 0 INTERVENTIONS - Wound Cleansing / Measurement X - Wound Imaging (photographs - any number of wounds) 1 5 []  - Wound Tracing (instead of photographs) 0 []  - Simple Wound Measurement - one  wound 0 X - Complex Wound Measurement - multiple wounds 6 5 []  - Simple Wound Cleansing - one wound 0 X - Complex Wound Cleansing - multiple wounds 6 5 INTERVENTIONS - Wound Dressings []  - Small Wound Dressing one or multiple wounds 0 []  - Medium Wound Dressing one or multiple wounds 0 X - Large Wound Dressing one or multiple wounds 2 20 []  - Application of Medications - injection 0 INTERVENTIONS - Miscellaneous []  - External ear exam 0 []  - Specimen Collection (cultures, biopsies, blood, body fluids, etc.) 0 []  - Specimen(s) / Culture(s) sent or taken to Lab for analysis 0 []  - Patient Transfer (multiple staff / Harrel Lemon Lift / Similar devices) 0 []  - Simple Staple / Suture removal (25 or less) 0 []  - Complex Staple / Suture removal (26 or more) 0 , Jillian B. ([id number]) []  - Hypo / Hyperglycemic Management (close monitor of Blood Glucose) 0 []  - Ankle / Brachial Index (ABI) - do not check if billed separately 0 Has the patient been seen at the hospital within the last three years: Yes Total Score: 245 Level Of Care: New/Established - Level 5 Electronic Signature(s) Signed: 10/02/2016 5:16:33 PM By: Alric Quan Entered By: Alric Quan on 10/02/2016 12:37:33 [name] ([id number]) -------------------------------------------------------------------------------- Encounter Discharge Information Details Patient Name: Jillian Albe B. Date of Service: 10/02/2016 10:30 AM Medical Record Number: [id number] Patient Account Number: [id number] Date of Birth/Sex: 20-Sep-1945 (71 y.o. Female) Treating RN: Ahmed Prima Primary Care Himmat Enberg: Lelon Huh Other  Clinician: Referring Venida Tsukamoto: Lelon Huh Treating Haru Anspaugh/Extender: Frann Rider in Treatment: 0 Encounter Discharge Information Items Discharge Pain Level: 10 Discharge Condition: Stable Ambulatory Status: Wheelchair Discharge Destination: Home Transportation: Private Auto Accompanied By: husband Schedule Follow-up Appointment: Yes Medication Reconciliation completed and provided to Patient/Care No Merril Isakson: Provided on Clinical Summary of Care: 10/02/2016 Form Type Recipient Paper Patient CS Electronic Signature(s) Signed: 10/04/2016 2:27:26 PM By: Ruthine Dose Entered By: Ruthine Dose on 10/02/2016 11:37:14 [name] ([id number]) -------------------------------------------------------------------------------- Lower Extremity Assessment Details Patient Name: Jillian Albe B. Date of Service: 10/02/2016 10:30 AM Medical Record Number: [id number] Patient Account Number: [id number] Date of Birth/Sex: 03/11/1945 (71 y.o. Female) Treating RN: Carolyne Fiscal, Debi Primary Care Koy Lamp: Lelon Huh Other Clinician: Referring Noam Karaffa: Lelon Huh Treating Jamiere Gulas/Extender: Frann Rider in Treatment: 0 Edema Assessment Assessed: [Left: No] [Right: No] Edema: [Left: Yes] [Right: Yes] Calf Left: Right: Point of Measurement: 28 cm From Medial Instep 47.7 cm 46.2 cm Ankle Left: Right: Point of Measurement: 9 cm From Medial Instep 26 cm 24 cm Vascular Assessment Pulses: Dorsalis Pedis Palpable: [Left:No] [Right:No] Doppler Audible: [Left:Yes] [Right:Yes] Posterior Tibial Palpable: [Left:No] [Right:No] Doppler Audible: [Left:Yes] [Right:Yes] Extremity colors, hair growth, and conditions: Extremity Color: [Left:Red] [Right:Red] Temperature of Extremity: [Left:Warm] [Right:Warm] Capillary Refill: [Left:< 3 seconds] [Right:< 3 seconds] Toe Nail Assessment Left: Right: Thick: Yes Yes Discolored: Yes Yes Deformed: Yes Yes Improper Length and  Hygiene: No No Electronic Signature(s) Signed: 10/02/2016 5:16:33 PM By: Alric Quan Entered By: Alric Quan on 10/02/2016 10:44:07 [name] ([id number]) Tamala Julian, Jeanice Lim ([id number]) -------------------------------------------------------------------------------- Multi Wound Chart Details Patient Name: Jillian Albe B. Date of Service: 10/02/2016 10:30 AM Medical Record Number: [id number] Patient Account Number: [id number] Date of Birth/Sex: 1945/04/01 (71 y.o. Female) Treating RN: Ahmed Prima Primary Care Victory Strollo: Lelon Huh Other Clinician: Referring Arianna Haydon: Lelon Huh Treating Yerick Eggebrecht/Extender: Frann Rider in Treatment: 0 Vital Signs Height(in): 64 Pulse(bpm): 62 Weight(lbs): 206 Blood Pressure 132/68 (mmHg): Body Mass Index(BMI): 35 Temperature(F): Respiratory Rate 16 (  breaths/min): Photos: [1:No Photos] [2:No Photos] [3:No Photos] Wound Location: [1:Left Lower Leg - Lateral] [2:Left Lower Leg - Anterior] [3:Left Lower Leg - Medial] Wounding Event: [1:Gradually Appeared] [2:Gradually Appeared] [3:Gradually Appeared] Primary Etiology: [1:Diabetic Wound/Ulcer of the Lower Extremity] [2:Diabetic Wound/Ulcer of the Lower Extremity] [3:Diabetic Wound/Ulcer of the Lower Extremity] Comorbid History: [1:Type II Diabetes] [2:Type II Diabetes] [3:Type II Diabetes] Date Acquired: [1:08/02/2016] [2:08/02/2016] [3:08/02/2016] Weeks of Treatment: [1:0] [2:0] [3:0] Wound Status: [1:Open] [2:Open] [3:Open] Measurements L x W x D 5.5x4x0.1 [2:4x2.5x0.1] [3:3.5x2x0.1] (cm) Area (cm) : [1:17.279] [2:7.854] [3:5.498] Volume (cm) : [1:1.728] [2:0.785] [3:0.55] Classification: [1:Grade 1] [2:Grade 1] [3:Grade 1] Exudate Amount: [1:Large] [2:Large] [3:Large] Exudate Type: [1:Serous] [2:Serous] [3:Serous] Exudate Color: [1:amber] [2:amber] [3:amber] Wound Margin: [1:Distinct, outline attached] [2:Distinct, outline attached] [3:Distinct, outline  attached] Granulation Amount: [1:None Present (0%)] [2:None Present (0%)] [3:None Present (0%)] Granulation Quality: [1:N/A] [2:N/A] [3:N/A] Necrotic Amount: [1:Large (67-100%)] [2:Large (67-100%)] [3:Large (67-100%)] Necrotic Tissue: [1:Eschar, Adherent Slough] [2:Adherent Slough] [3:Eschar, Adherent Slough] Epithelialization: [1:None] [2:None] [3:None] Periwound Skin Texture: No Abnormalities Noted [2:No Abnormalities Noted] [3:No Abnormalities Noted] Periwound Skin [1:Maceration: Yes] [2:Maceration: Yes] [3:Maceration: Yes] Moisture: Periwound Skin Color: Erythema: Yes [2:Erythema: Yes] [3:Erythema: Yes] Erythema Location: [1:Circumferential] [2:Circumferential] [3:Circumferential] Temperature: [1:No Abnormality] [2:No Abnormality] [3:No Abnormality] Tenderness on No Yes Yes Palpation: Wound Preparation: Ulcer Cleansing: Ulcer Cleansing: Ulcer Cleansing: Rinsed/Irrigated with Rinsed/Irrigated with Rinsed/Irrigated with Saline Saline Saline Topical Anesthetic Topical Anesthetic Topical Anesthetic Applied: Other: lidocaine Applied: Other: lidocaine Applied: Other: lidocaine 4% 4% 4% Wound Number: [id number] 6  Photos: No Photos No Photos No Photos Wound Location: Right Lower Leg - Anterior Right Lower Leg - Medial Right Lower Leg - Lateral Wounding Event: Gradually Appeared Gradually Appeared Gradually Appeared Primary Etiology: 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 the Lower Extremity the Lower Extremity Comorbid History: Type II Diabetes Type II Diabetes Type II Diabetes Date Acquired: 08/02/2016 08/02/2016 08/02/2016 Weeks of Treatment: 0 0 0 Wound Status: Open Open Open Measurements L x W x D 0.5x0.4x0.1 0.3x0.5x0.1 5x1.6x0.1 (cm) Area (cm) : 0.157 0.118 6.283 Volume (cm) : 0.016 0.012 0.628 Classification: Grade 1 Grade 1 Grade 1 Exudate Amount: Large Large Large Exudate Type: Serous Serous Serous Exudate Color: amber amber  amber Wound Margin: Distinct, outline attached Distinct, outline attached Distinct, outline attached Granulation Amount: Large (67-100%) Large (67-100%) Large (67-100%) Granulation Quality: Red Red Red Necrotic Amount: Small (1-33%) None Present (0%) None Present (0%) Necrotic Tissue: Adherent Slough N/A N/A Epithelialization: None None None Periwound Skin Texture: No Abnormalities Noted No Abnormalities Noted No Abnormalities Noted Periwound Skin No Abnormalities Noted Maceration: Yes Maceration: Yes Moisture: Periwound Skin Color: Erythema: Yes Erythema: Yes Erythema: Yes Erythema Location: Circumferential Circumferential Circumferential Temperature: No Abnormality N/A No Abnormality Tenderness on Yes No Yes Palpation: Wound Preparation: Ulcer Cleansing: Ulcer Cleansing: Ulcer Cleansing: Rinsed/Irrigated with Rinsed/Irrigated with Rinsed/Irrigated with Saline Saline Saline Topical Anesthetic Topical Anesthetic Topical Anesthetic Applied: Other: lidocaine Applied: Other: lidocaine Applied: Other: lidocaine 4% 4% 4% [name], [name] ([id number]) Treatment Notes Electronic Signature(s) Signed: 10/02/2016 11:48:17 AM By: Christin Fudge MD, FACS Entered By: Christin Fudge on 10/02/2016 11:48:17 [name] ([id number]) -------------------------------------------------------------------------------- [place] Details Patient Name: [name], LOBO. Date of Service: 10/02/2016 10:30 AM Medical Record Number: [id number] Patient Account Number: [id number] Date of Birth/Sex: [birth year] (71 y.o. Female) Treating RN: Ahmed Prima Primary Care Tavie Haseman: Lelon Huh Other Clinician: Referring Shamonica Schadt: Lelon Huh Treating Ilynn Stauffer/Extender: Frann Rider in Treatment: 0 Active Inactive ` Abuse /  Safety / Falls / Self Care Management Nursing Diagnoses: History of Falls Potential for falls Goals: Patient will not experience any injury related to  falls Date Initiated: 10/02/2016 Target Resolution Date: 01/06/2017 Goal Status: Active Interventions: Assess: immobility, friction, shearing, incontinence upon admission and as needed Assess impairment of mobility on admission and as needed per policy Assess personal safety and home safety (as indicated) on admission and as needed Notes: ` Nutrition Nursing Diagnoses: Imbalanced nutrition Impaired glucose control: actual or potential Potential for alteratiion in Nutrition/Potential for imbalanced nutrition Goals: Patient/caregiver agrees to and verbalizes understanding of need to use nutritional supplements and/or vitamins as prescribed Date Initiated: 10/02/2016 Target Resolution Date: 02/10/2017 Goal Status: Active Patient/caregiver will maintain therapeutic glucose control Date Initiated: 10/02/2016 Target Resolution Date: 01/06/2017 Goal Status: Active Interventions: [name], [name] ([id number]) Assess patient nutrition upon admission and as needed per policy Provide education on elevated blood sugars and impact on wound healing Provide education on nutrition Notes: ` Orientation to the Wound Care Program Nursing Diagnoses: Knowledge deficit related to the wound healing center program Goals: Patient/caregiver will verbalize understanding of the [place] Date Initiated: 10/02/2016 Target Resolution Date: 11/11/2016 Goal Status: Active Interventions: Provide education on orientation to the wound center Notes: ` Pain, Acute or Chronic Nursing Diagnoses: Pain, acute or chronic: actual or potential Potential alteration in comfort, pain Goals: Patient/caregiver will verbalize adequate pain control between visits Date Initiated: 10/02/2016 Target Resolution Date: 02/10/2017 Goal Status: Active Interventions: Complete pain assessment as per visit requirements Notes: ` Wound/Skin Impairment Nursing Diagnoses: Impaired tissue integrity Knowledge deficit  related to ulceration/compromised skin integrity Goals: Ulcer/skin breakdown will have a volume reduction of 80% by week 12 [name], [name] ([id number]) Date Initiated: 10/02/2016 Target Resolution Date: 02/03/2017 Goal Status: Active Interventions: Assess patient/caregiver ability to perform ulcer/skin care regimen upon admission and as needed Assess ulceration(s) every visit Notes: Electronic Signature(s) Signed: 10/02/2016 5:16:33 PM By: Alric Quan Entered By: Alric Quan on 10/02/2016 11:13:35 [name] ([id number]) -------------------------------------------------------------------------------- Pain Assessment Details Patient Name: Jillian Albe B. Date of Service: 10/02/2016 10:30 AM Medical Record Number: [id number] Patient Account Number: [id number] Date of Birth/Sex: 13-Feb-1945 (71 y.o. Female) Treating RN: Ahmed Prima Primary Care Layliana Devins: Lelon Huh Other Clinician: Referring Shjon Lizarraga: Lelon Huh Treating Jillian Renfroe/Extender: Frann Rider in Treatment: 0 Active Problems Location of Pain Severity and Description of Pain Patient Has Paino Yes Site Locations Pain Location: Pain in Ulcers Rate the pain. Current Pain Level: 10 Character of Pain Describe the Pain: Aching, Burning, Tender, Throbbing Pain Management and Medication Current Pain Management: Electronic Signature(s) Signed: 10/02/2016 5:16:33 PM By: Alric Quan Entered By: Alric Quan on 10/02/2016 10:26:48 [name] ([id number]) -------------------------------------------------------------------------------- Patient/Caregiver Education Details Patient Name: Jillian Albe B. Date of Service: 10/02/2016 10:30 AM Medical Record Number: [id number] Patient Account Number: [id number] Date of Birth/Gender: [birth year] (71 y.o. Female) Treating RN: Ahmed Prima Primary Care Physician: Lelon Huh Other Clinician: Referring Physician: Lelon Huh Treating  Physician/Extender: Frann Rider in Treatment: 0 Education Assessment Education Provided To: Patient and Caregiver husband Education Topics Provided Elevated Blood Sugar/ Impact on Healing: Handouts: Elevated Blood Sugars: How Do They Affect Wound Healing Methods: Explain/Verbal Responses: State content correctly Nutrition: Handouts: Elevated Blood Sugars: How Do They Affect Wound Healing Methods: Explain/Verbal Responses: State content correctly Welcome To [facility]: Handouts: Welcome To [facility] Methods: Explain/Verbal Responses: State content correctly Wound/Skin Impairment: Handouts: Other: change dressing as ordered Methods: Demonstration, Explain/Verbal Responses: State content  correctly Electronic Signature(s) Signed: 10/02/2016 5:16:33 PM By: Alric Quan Entered By: Alric Quan on 10/02/2016 11:15:06 [name] ([id number]) -------------------------------------------------------------------------------- Wound Assessment Details Patient Name: [name], Jillian B. Date of Service: 10/02/2016 10:30 AM Medical Record Number: [id number] Patient Account Number: [id number] Date of Birth/Sex: November 29, 1945 (71 y.o. Female) Treating RN: Carolyne Fiscal, Debi Primary Care Jaice Lague: Lelon Huh Other Clinician: Referring Gaylord Seydel: Lelon Huh Treating Jamarrion Budai/Extender: Frann Rider in Treatment: 0 Wound Status Wound Number: 1 Primary Diabetic Wound/Ulcer of the Lower Etiology: Extremity Wound Location: Left Lower Leg - Lateral Wound Status: Open Wounding Event: Gradually Appeared Comorbid Type II Diabetes Date Acquired: 08/02/2016 History: Weeks Of Treatment: 0 Clustered Wound: No Photos Photo Uploaded By: Alric Quan on 10/02/2016 15:03:06 Wound Measurements Length: (cm) 5.5 Width: (cm) 4 Depth: (cm) 0.1 Area: (cm) 17.279 Volume: (cm) 1.728 % Reduction in Area: % Reduction in Volume: Epithelialization:  None Tunneling: No Undermining: No Wound Description Classification: Grade 1 Foul Odor Aft Wound Margin: Distinct, outline attached Slough/Fibrin Exudate Amount: Large Exudate Type: Serous Exudate Color: amber er Cleansing: No o Yes Wound Bed Granulation Amount: None Present (0%) Necrotic Amount: Large (67-100%) Necrotic Quality: Eschar, Adherent [address] STARIA, BIRKHEAD B. ([id number]) Texture Color No Abnormalities Noted: No No Abnormalities Noted: No Erythema: Yes Moisture Erythema Location: Circumferential No Abnormalities Noted: No Maceration: Yes Temperature / Pain Temperature: No Abnormality Wound Preparation Ulcer Cleansing: Rinsed/Irrigated with Saline Topical Anesthetic Applied: Other: lidocaine 4%, Treatment Notes Wound #1 (Left, Lateral Lower Leg) 1. Cleansed with: Clean wound with Normal Saline 2. Anesthetic Topical Lidocaine 4% cream to wound bed prior to debridement 4. Dressing Applied: Santyl Ointment 5. Secondary Dressing Applied ABD Pad Dry Gauze 7. Secured with Tape Notes unna to anchor, xtrasorb, kerlix, Event organiser) Signed: 10/02/2016 5:16:33 PM By: Alric Quan Entered By: Alric Quan on 10/02/2016 10:48:56 [name] ([id number]) -------------------------------------------------------------------------------- Wound Assessment Details Patient Name: [name], Jillian B. Date of Service: 10/02/2016 10:30 AM Medical Record Number: [id number] Patient Account Number: [id number] Date of Birth/Sex: 14-Dec-1945 (71 y.o. Female) Treating RN: Ahmed Prima Primary Care [place] Gamero: Lelon Huh Other Clinician: Referring Terea Neubauer: Lelon Huh Treating Sherle Mello/Extender: Frann Rider in Treatment: 0 Wound Status Wound Number: 2 Primary Diabetic Wound/Ulcer of the Lower Etiology: Extremity Wound Location: Left Lower Leg - Anterior Wound Status: Open Wounding Event: Gradually Appeared Comorbid  Type II Diabetes Date Acquired: 08/02/2016 History: Weeks Of Treatment: 0 Clustered Wound: No Photos Photo Uploaded By: Alric Quan on 10/02/2016 15:03:50 Wound Measurements Length: (cm) 4 Width: (cm) 2.5 Depth: (cm) 0.1 Area: (cm) 7.854 Volume: (cm) 0.785 % Reduction in Area: % Reduction in Volume: Epithelialization: None Tunneling: No Undermining: No Wound Description Classification: Grade 1 Foul Odor Aft Wound Margin: Distinct, outline attached Slough/Fibrin Exudate Amount: Large Exudate Type: Serous Exudate Color: amber er Cleansing: No o Yes Wound Bed Granulation Amount: None Present (0%) Necrotic Amount: Large (67-100%) Necrotic Quality: Adherent Slough Periwound Skin Texture [name], Jillian B. ([id number]) Texture Color No Abnormalities Noted: No No Abnormalities Noted: No Erythema: Yes Moisture Erythema Location: Circumferential No Abnormalities Noted: No Maceration: Yes Temperature / Pain Temperature: No Abnormality Tenderness on Palpation: Yes Wound Preparation Ulcer Cleansing: Rinsed/Irrigated with Saline Topical Anesthetic Applied: Other: lidocaine 4%, Treatment Notes Wound #2 (Left, Anterior Lower Leg) 1. Cleansed with: Clean wound with Normal Saline 2. Anesthetic Topical Lidocaine 4% cream to wound bed prior to debridement 4. Dressing Applied: Santyl Ointment 5. Secondary Dressing Applied ABD Pad Dry Gauze 7. Secured with Tape Notes unna to  anchor, xtrasorb, kerlix, Event organiser) Signed: 10/02/2016 5:16:33 PM By: Alric Quan Entered By: Alric Quan on 10/02/2016 10:50:03 [name] ([id number]) -------------------------------------------------------------------------------- Wound Assessment Details Patient Name: [name], MCAFEE B. Date of Service: 10/02/2016 10:30 AM Medical Record Number: [id number] Patient Account Number: [id number] Date of Birth/Sex: 1945-03-27 (71 y.o. Female) Treating RN: Carolyne Fiscal,  Debi Primary Care Brace Welte: Lelon Huh Other Clinician: Referring Gavriella Hearst: Lelon Huh Treating Achsah Mcquade/Extender: Frann Rider in Treatment: 0 Wound Status Wound Number: 3 Primary Diabetic Wound/Ulcer of the Lower Etiology: Extremity Wound Location: Left Lower Leg - Medial Wound Status: Open Wounding Event: Gradually Appeared Comorbid Type II Diabetes Date Acquired: 08/02/2016 History: Weeks Of Treatment: 0 Clustered Wound: No Photos Photo Uploaded By: Alric Quan on 10/02/2016 15:03:50 Wound Measurements Length: (cm) 3.5 Width: (cm) 2 Depth: (cm) 0.1 Area: (cm) 5.498 Volume: (cm) 0.55 % Reduction in Area: % Reduction in Volume: Epithelialization: None Tunneling: No Undermining: No Wound Description Classification: Grade 1 Foul Odor Aft Wound Margin: Distinct, outline attached Slough/Fibrin Exudate Amount: Large Exudate Type: Serous Exudate Color: amber er Cleansing: No o Yes Wound Bed Granulation Amount: None Present (0%) Necrotic Amount: Large (67-100%) Necrotic Quality: Eschar, Adherent Slough Periwound Skin Texture TEXANNA, HILBURN B. ([id number]) Texture Color No Abnormalities Noted: No No Abnormalities Noted: No Erythema: Yes Moisture Erythema Location: Circumferential No Abnormalities Noted: No Maceration: Yes Temperature / Pain Temperature: No Abnormality Tenderness on Palpation: Yes Wound Preparation Ulcer Cleansing: Rinsed/Irrigated with Saline Topical Anesthetic Applied: Other: lidocaine 4%, Treatment Notes Wound #3 (Left, Medial Lower Leg) 1. Cleansed with: Clean wound with Normal Saline 2. Anesthetic Topical Lidocaine 4% cream to wound bed prior to debridement 4. Dressing Applied: Santyl Ointment 5. Secondary Dressing Applied ABD Pad Dry Gauze 7. Secured with Tape Notes unna to anchor, xtrasorb, kerlix, Event organiser) Signed: 10/02/2016 5:16:33 PM By: Alric Quan Entered By: Alric Quan  on 10/02/2016 10:51:17 [name] ([id number]) -------------------------------------------------------------------------------- Wound Assessment Details Patient Name: FERNE, ELLINGWOOD B. Date of Service: 10/02/2016 10:30 AM Medical Record Number: [id number] Patient Account Number: [id number] Date of Birth/Sex: [birth year] (71 y.o. Female) Treating RN: Carolyne Fiscal, Debi Primary Care Rathana Viveros: Lelon Huh Other Clinician: Referring Coury Grieger: Lelon Huh Treating Dary Dilauro/Extender: Frann Rider in Treatment: 0 Wound Status Wound Number: 4 Primary Diabetic Wound/Ulcer of the Lower Etiology: Extremity Wound Location: Right Lower Leg - Anterior Wound Status: Open Wounding Event: Gradually Appeared Comorbid Type II Diabetes Date Acquired: 08/02/2016 History: Weeks Of Treatment: 0 Clustered Wound: No Photos Photo Uploaded By: Alric Quan on 10/02/2016 15:04:39 Wound Measurements Length: (cm) 0.5 Width: (cm) 0.4 Depth: (cm) 0.1 Area: (cm) 0.157 Volume: (cm) 0.016 % Reduction in Area: % Reduction in Volume: Epithelialization: None Tunneling: No Undermining: No Wound Description Classification: Grade 1 Foul Odor Aft Wound Margin: Distinct, outline attached Slough/Fibrin Exudate Amount: Large Exudate Type: Serous Exudate Color: amber er Cleansing: No o Yes Wound Bed Granulation Amount: Large (67-100%) Granulation Quality: Red Necrotic Amount: Small (1-33%) Necrotic Quality: Adherent [address] [place] B. ([id number]) Periwound Skin Texture Texture Color No Abnormalities Noted: No No Abnormalities Noted: No Erythema: Yes Moisture Erythema Location: Circumferential No Abnormalities Noted: No Temperature / Pain Temperature: No Abnormality Tenderness on Palpation: Yes Wound Preparation Ulcer Cleansing: Rinsed/Irrigated with Saline Topical Anesthetic Applied: Other: lidocaine 4%, Treatment Notes Wound #4 (Right, Anterior Lower Leg) 1. Cleansed  with: Clean wound with Normal Saline 2. Anesthetic Topical Lidocaine 4% cream to wound bed prior to debridement 4. Dressing Applied: Other dressing (specify in notes) 5. Secondary Dressing Applied  ABD Pad Dry Gauze 7. Secured with Tape Notes unna to anchor, xtrasorb kerlix, coban, silvercel Electronic Signature(s) Signed: 10/02/2016 5:16:33 PM By: Alric Quan Entered By: Alric Quan on 10/02/2016 11:00:55 [name] ([id number]) -------------------------------------------------------------------------------- Wound Assessment Details Patient Name: AJIA, CHADDERDON B. Date of Service: 10/02/2016 10:30 AM Medical Record Number: [id number] Patient Account Number: [id number] Date of Birth/Sex: [birth year] (71 y.o. Female) Treating RN: Carolyne Fiscal, Debi Primary Care Analeia Ismael: Lelon Huh Other Clinician: Referring Ulas Zuercher: Lelon Huh Treating Hoda Hon/Extender: Frann Rider in Treatment: 0 Wound Status Wound Number: 5 Primary Diabetic Wound/Ulcer of the Lower Etiology: Extremity Wound Location: Right Lower Leg - Medial Wound Status: Open Wounding Event: Gradually Appeared Comorbid Type II Diabetes Date Acquired: 08/02/2016 History: Weeks Of Treatment: 0 Clustered Wound: No Photos Photo Uploaded By: Alric Quan on 10/02/2016 15:04:39 Wound Measurements Length: (cm) 0.3 Width: (cm) 0.5 Depth: (cm) 0.1 Area: (cm) 0.118 Volume: (cm) 0.012 % Reduction in Area: % Reduction in Volume: Epithelialization: None Tunneling: No Undermining: No Wound Description Classification: Grade 1 Foul Odor Aft Wound Margin: Distinct, outline attached Slough/Fibrin Exudate Amount: Large Exudate Type: Serous Exudate Color: amber er Cleansing: No o No Wound Bed Granulation Amount: Large (67-100%) Granulation Quality: Red Necrotic Amount: None Present (0%) Periwound Skin Texture NAILYN, DEARINGER B. ([id number]) Texture Color No Abnormalities Noted: No No  Abnormalities Noted: No Erythema: Yes Moisture Erythema Location: Circumferential No Abnormalities Noted: No Maceration: Yes Wound Preparation Ulcer Cleansing: Rinsed/Irrigated with Saline Topical Anesthetic Applied: Other: lidocaine 4%, Treatment Notes Wound #5 (Right, Medial Lower Leg) 1. Cleansed with: Clean wound with Normal Saline 2. Anesthetic Topical Lidocaine 4% cream to wound bed prior to debridement 4. Dressing Applied: Other dressing (specify in notes) 5. Secondary Dressing Applied ABD Pad Dry Gauze 7. Secured with Tape Notes unna to anchor, xtrasorb kerlix, coban, silvercel Electronic Signature(s) Signed: 10/02/2016 5:16:33 PM By: Alric Quan Entered By: Alric Quan on 10/02/2016 11:02:18 [name] ([id number]) -------------------------------------------------------------------------------- Wound Assessment Details Patient Name: KALLYN, DEMARCUS B. Date of Service: 10/02/2016 10:30 AM Medical Record Number: [id number] Patient Account Number: [id number] Date of Birth/Sex: [birth year] (71 y.o. Female) Treating RN: Carolyne Fiscal, Debi Primary Care Jacoba Cherney: Lelon Huh Other Clinician: Referring Linda Grimmer: Lelon Huh Treating Ayo Guarino/Extender: Frann Rider in Treatment: 0 Wound Status Wound Number: 6 Primary Diabetic Wound/Ulcer of the Lower Etiology: Extremity Wound Location: Right Lower Leg - Lateral Wound Status: Open Wounding Event: Gradually Appeared Comorbid Type II Diabetes Date Acquired: 08/02/2016 History: Weeks Of Treatment: 0 Clustered Wound: No Photos Photo Uploaded By: Alric Quan on 10/02/2016 15:05:19 Wound Measurements Length: (cm) 5 Width: (cm) 1.6 Depth: (cm) 0.1 Area: (cm) 6.283 Volume: (cm) 0.628 % Reduction in Area: % Reduction in Volume: Epithelialization: None Tunneling: No Undermining: No Wound Description Classification: Grade 1 Foul Odor Aft Wound Margin: Distinct, outline attached  Slough/Fibrin Exudate Amount: Large Exudate Type: Serous Exudate Color: amber er Cleansing: No o No Wound Bed Granulation Amount: Large (67-100%) Granulation Quality: Red Necrotic Amount: None Present (0%) Periwound Skin Texture BARBARA, AHART B. ([id number]) Texture Color No Abnormalities Noted: No No Abnormalities Noted: No Erythema: Yes Moisture Erythema Location: Circumferential No Abnormalities Noted: No Maceration: Yes Temperature / Pain Temperature: No Abnormality Tenderness on Palpation: Yes Wound Preparation Ulcer Cleansing: Rinsed/Irrigated with Saline Topical Anesthetic Applied: Other: lidocaine 4%, Treatment Notes Wound #6 (Right, Lateral Lower Leg) 1. Cleansed with: Clean wound with Normal Saline 2. Anesthetic Topical Lidocaine 4% cream to wound bed prior to debridement 4. Dressing Applied: Other dressing (specify in notes) 5. Secondary  Dressing Applied ABD Pad Dry Gauze 7. Secured with Tape Notes unna to anchor, xtrasorb kerlix, coban, silvercel Electronic Signature(s) Signed: 10/02/2016 5:16:33 PM By: Alric Quan Entered By: Alric Quan on 10/02/2016 11:03:57 [name] ([id number]) -------------------------------------------------------------------------------- Vitals Details Patient Name: Jillian Albe B. Date of Service: 10/02/2016 10:30 AM Medical Record Number: [id number] Patient Account Number: [id number] Date of Birth/Sex: [birth year] (71 y.o. Female) Treating RN: Carolyne Fiscal, Debi Primary Care Kathya Wilz: Lelon Huh Other Clinician: Referring Daneya Hartgrove: Lelon Huh Treating Zackarie Chason/Extender: Frann Rider in Treatment: 0 Vital Signs Time Taken: 10:26 Pulse (bpm): 62 Height (in): 64 Respiratory Rate (breaths/min): 16 Source: Stated Blood Pressure (mmHg): 132/68 Weight (lbs): 206 Reference Range: 80 - 120 mg / dl Source: Measured Body Mass Index (BMI): 35.4 Electronic Signature(s) Signed: 10/02/2016 5:16:33 PM By:  Alric Quan Entered By: Alric Quan on 10/02/2016 10:28:32

## 2016-10-06 DIAGNOSIS — I509 Heart failure, unspecified: Secondary | ICD-10-CM | POA: Diagnosis not present

## 2016-10-06 DIAGNOSIS — I110 Hypertensive heart disease with heart failure: Secondary | ICD-10-CM | POA: Diagnosis not present

## 2016-10-06 DIAGNOSIS — M1990 Unspecified osteoarthritis, unspecified site: Secondary | ICD-10-CM | POA: Diagnosis not present

## 2016-10-06 DIAGNOSIS — L97222 Non-pressure chronic ulcer of left calf with fat layer exposed: Secondary | ICD-10-CM | POA: Diagnosis not present

## 2016-10-06 DIAGNOSIS — E11622 Type 2 diabetes mellitus with other skin ulcer: Secondary | ICD-10-CM | POA: Diagnosis not present

## 2016-10-06 DIAGNOSIS — I272 Pulmonary hypertension, unspecified: Secondary | ICD-10-CM | POA: Diagnosis not present

## 2016-10-06 DIAGNOSIS — L97212 Non-pressure chronic ulcer of right calf with fat layer exposed: Secondary | ICD-10-CM | POA: Diagnosis not present

## 2016-10-06 DIAGNOSIS — L97211 Non-pressure chronic ulcer of right calf limited to breakdown of skin: Secondary | ICD-10-CM | POA: Diagnosis not present

## 2016-10-06 DIAGNOSIS — J45909 Unspecified asthma, uncomplicated: Secondary | ICD-10-CM | POA: Diagnosis not present

## 2016-10-06 DIAGNOSIS — I2510 Atherosclerotic heart disease of native coronary artery without angina pectoris: Secondary | ICD-10-CM | POA: Diagnosis not present

## 2016-10-06 DIAGNOSIS — Z79891 Long term (current) use of opiate analgesic: Secondary | ICD-10-CM | POA: Diagnosis not present

## 2016-10-06 DIAGNOSIS — Z4800 Encounter for change or removal of nonsurgical wound dressing: Secondary | ICD-10-CM | POA: Diagnosis not present

## 2016-10-06 DIAGNOSIS — I429 Cardiomyopathy, unspecified: Secondary | ICD-10-CM | POA: Diagnosis not present

## 2016-10-06 DIAGNOSIS — Z7984 Long term (current) use of oral hypoglycemic drugs: Secondary | ICD-10-CM | POA: Diagnosis not present

## 2016-10-09 ENCOUNTER — Encounter: Payer: Medicare Other | Admitting: Surgery

## 2016-10-09 DIAGNOSIS — L97829 Non-pressure chronic ulcer of other part of left lower leg with unspecified severity: Secondary | ICD-10-CM | POA: Diagnosis not present

## 2016-10-09 DIAGNOSIS — I110 Hypertensive heart disease with heart failure: Secondary | ICD-10-CM | POA: Diagnosis not present

## 2016-10-09 DIAGNOSIS — L97819 Non-pressure chronic ulcer of other part of right lower leg with unspecified severity: Secondary | ICD-10-CM | POA: Diagnosis not present

## 2016-10-09 DIAGNOSIS — L97222 Non-pressure chronic ulcer of left calf with fat layer exposed: Secondary | ICD-10-CM | POA: Diagnosis not present

## 2016-10-09 DIAGNOSIS — Z880 Allergy status to penicillin: Secondary | ICD-10-CM | POA: Diagnosis not present

## 2016-10-09 DIAGNOSIS — L97229 Non-pressure chronic ulcer of left calf with unspecified severity: Secondary | ICD-10-CM | POA: Diagnosis not present

## 2016-10-09 DIAGNOSIS — L97212 Non-pressure chronic ulcer of right calf with fat layer exposed: Secondary | ICD-10-CM | POA: Diagnosis not present

## 2016-10-09 DIAGNOSIS — Z7984 Long term (current) use of oral hypoglycemic drugs: Secondary | ICD-10-CM | POA: Diagnosis not present

## 2016-10-09 DIAGNOSIS — L97219 Non-pressure chronic ulcer of right calf with unspecified severity: Secondary | ICD-10-CM | POA: Diagnosis not present

## 2016-10-09 DIAGNOSIS — Z79899 Other long term (current) drug therapy: Secondary | ICD-10-CM | POA: Diagnosis not present

## 2016-10-09 DIAGNOSIS — I509 Heart failure, unspecified: Secondary | ICD-10-CM | POA: Diagnosis not present

## 2016-10-09 DIAGNOSIS — I429 Cardiomyopathy, unspecified: Secondary | ICD-10-CM | POA: Diagnosis not present

## 2016-10-09 DIAGNOSIS — E11622 Type 2 diabetes mellitus with other skin ulcer: Secondary | ICD-10-CM | POA: Diagnosis not present

## 2016-10-09 DIAGNOSIS — M1990 Unspecified osteoarthritis, unspecified site: Secondary | ICD-10-CM | POA: Diagnosis not present

## 2016-10-09 DIAGNOSIS — I2510 Atherosclerotic heart disease of native coronary artery without angina pectoris: Secondary | ICD-10-CM | POA: Diagnosis not present

## 2016-10-09 DIAGNOSIS — J45909 Unspecified asthma, uncomplicated: Secondary | ICD-10-CM | POA: Diagnosis not present

## 2016-10-09 DIAGNOSIS — I890 Lymphedema, not elsewhere classified: Secondary | ICD-10-CM | POA: Diagnosis not present

## 2016-10-09 DIAGNOSIS — I272 Pulmonary hypertension, unspecified: Secondary | ICD-10-CM | POA: Diagnosis not present

## 2016-10-10 NOTE — Progress Notes (Signed)
[name], [name] ([id number]) Visit Report for 10/09/2016 Arrival Information Details Patient Name: [name], [name]. Date of Service: 10/09/2016 12:30 PM Medical Record Number: [id number] Patient Account Number: [id number] Date of Birth/Sex: [birth year] (71 y.o. Female) Treating RN: Ahmed Prima Primary Care Jorgen Wolfinger: Lelon Huh Other Clinician: Referring Cartrell Bentsen: Lelon Huh Treating Erikah Thumm/Extender: Frann Rider in Treatment: 1 Visit Information History Since Last Visit All ordered tests and consults were completed: No Patient Arrived: Wheel Chair Added or deleted any medications: No Arrival Time: 12:45 Any new allergies or adverse reactions: No Accompanied By: husband Had a fall or experienced change in No Transfer Assistance: EasyPivot activities of daily living that may affect Patient Lift risk of falls: Patient Identification Verified: Yes Signs or symptoms of abuse/neglect since last No Secondary Verification Process Yes visito Completed: Hospitalized since last visit: No Patient Requires Transmission- No Has Dressing in Place as Prescribed: Yes Based Precautions: Has Compression in Place as Prescribed: Yes Patient Has Alerts: Yes Pain Present Now: Yes Patient Alerts: R ABI 1.8 L ABI 1.0 DM II Electronic Signature(s) Signed: 10/09/2016 5:09:28 PM By: Alric Quan Entered By: Alric Quan on 10/09/2016 12:46:14 [name] ([id number]) -------------------------------------------------------------------------------- Clinic Level of Care Assessment Details Patient Name: [name] Date of Service: 10/09/2016 12:30 PM Medical Record Number: [id number] Patient Account Number: [id number] Date of Birth/Sex: October 31, 1945 (71 y.o. Female) Treating RN: Carolyne Fiscal, Debi Primary Care Robi Dewolfe: Lelon Huh Other Clinician: Referring Lamoyne Hessel: Lelon Huh Treating Aerik Polan/Extender: Frann Rider in Treatment: 1 Clinic Level of Care  Assessment Items TOOL 4 Quantity Score X - Use when only an EandM is performed on FOLLOW-UP visit 1 0 ASSESSMENTS - Nursing Assessment / Reassessment X - Reassessment of Co-morbidities (includes updates in patient status) 1 10 X - Reassessment of Adherence to Treatment Plan 1 5 ASSESSMENTS - Wound and Skin Assessment / Reassessment []  - Simple Wound Assessment / Reassessment - one wound 0 X - Complex Wound Assessment / Reassessment - multiple wounds 6 5 []  - Dermatologic / Skin Assessment (not related to wound area) 0 ASSESSMENTS - Focused Assessment []  - Circumferential Edema Measurements - multi extremities 0 []  - Nutritional Assessment / Counseling / Intervention 0 []  - Lower Extremity Assessment (monofilament, tuning fork, pulses) 0 []  - Peripheral Arterial Disease Assessment (using hand held doppler) 0 ASSESSMENTS - Ostomy and/or Continence Assessment and Care []  - Incontinence Assessment and Management 0 []  - Ostomy Care Assessment and Management (repouching, etc.) 0 PROCESS - Coordination of Care []  - Simple Patient / Family Education for ongoing care 0 X - Complex (extensive) Patient / Family Education for ongoing care 1 20 X - Staff obtains Programmer, systems, Records, Test Results / Process Orders 1 10 X - Staff telephones HHA, Nursing Homes / Clarify orders / etc 1 10 []  - Routine Transfer to another Facility (non-emergent condition) 0 NORRINE, BALLESTER ([id number]) []  - Routine Hospital Admission (non-emergent condition) 0 []  - New Admissions / Biomedical engineer / Ordering NPWT, Apligraf, etc. 0 []  - Emergency Hospital Admission (emergent condition) 0 X - Simple Discharge Coordination 1 10 []  - Complex (extensive) Discharge Coordination 0 PROCESS - Special Needs []  - Pediatric / Minor Patient Management 0 []  - Isolation Patient Management 0 []  - Hearing / Language / Visual special needs 0 []  - Assessment of Community assistance (transportation, D/C planning, etc.) 0 []  -  Additional assistance / Altered mentation 0 []  - Support Surface(s) Assessment (bed, cushion, seat, etc.) 0 INTERVENTIONS - Wound Cleansing / Measurement []  -  Simple Wound Cleansing - one wound 0 X - Complex Wound Cleansing - multiple wounds 6 5 X - Wound Imaging (photographs - any number of wounds) 1 5 []  - Wound Tracing (instead of photographs) 0 []  - Simple Wound Measurement - one wound 0 X - Complex Wound Measurement - multiple wounds 4 5 INTERVENTIONS - Wound Dressings []  - Small Wound Dressing one or multiple wounds 0 []  - Medium Wound Dressing one or multiple wounds 0 X - Large Wound Dressing one or multiple wounds 2 20 X - Application of Medications - topical 1 5 []  - Application of Medications - injection 0 INTERVENTIONS - Miscellaneous []  - External ear exam 0 [name], Jillian B. ([id number]) []  - Specimen Collection (cultures, biopsies, blood, body fluids, etc.) 0 []  - Specimen(s) / Culture(s) sent or taken to Lab for analysis 0 []  - Patient Transfer (multiple staff / Harrel Lemon Lift / Similar devices) 0 []  - Simple Staple / Suture removal (25 or less) 0 []  - Complex Staple / Suture removal (26 or more) 0 []  - Hypo / Hyperglycemic Management (close monitor of Blood Glucose) 0 []  - Ankle / Brachial Index (ABI) - do not check if billed separately 0 X - Vital Signs 1 5 Has the patient been seen at the hospital within the last three years: Yes Total Score: 200 Level Of Care: New/Established - Level 5 Electronic Signature(s) Signed: 10/09/2016 5:09:28 PM By: Alric Quan Entered By: Alric Quan on 10/09/2016 15:05:52 [name] ([id number]) -------------------------------------------------------------------------------- Encounter Discharge Information Details Patient Name: Jillian Albe B. Date of Service: 10/09/2016 12:30 PM Medical Record Number: [id number] Patient Account Number: [id number] Date of Birth/Sex: Sep 14, 1945 (71 y.o. Female) Treating RN: Ahmed Prima Primary Care Lael Pilch: Lelon Huh Other Clinician: Referring Antion Andres: Lelon Huh Treating Neilson Oehlert/Extender: Frann Rider in Treatment: 1 Encounter Discharge Information Items Discharge Pain Level: 0 Discharge Condition: Stable Ambulatory Status: Wheelchair Discharge Destination: Home Transportation: Private Auto Accompanied By: husband Schedule Follow-up Appointment: Yes Medication Reconciliation completed and provided to Patient/Care No Harlee Pursifull: Provided on Clinical Summary of Care: 10/09/2016 Form Type Recipient Paper Patient CS Electronic Signature(s) Signed: 10/09/2016 5:09:28 PM By: Alric Quan Entered By: Alric Quan on 10/09/2016 14:15:29 [name] ([id number]) -------------------------------------------------------------------------------- Lower Extremity Assessment Details Patient Name: Jillian Albe B. Date of Service: 10/09/2016 12:30 PM Medical Record Number: [id number] Patient Account Number: [id number] Date of Birth/Sex: 04-Jan-1945 (71 y.o. Female) Treating RN: Carolyne Fiscal, Debi Primary Care Makayela Secrest: Lelon Huh Other Clinician: Referring Ryli Standlee: Lelon Huh Treating Lundynn Cohoon/Extender: Frann Rider in Treatment: 1 Edema Assessment Assessed: [Left: No] [Right: No] E[Left: dema] [Right: :] Calf Left: Right: Point of Measurement: 28 cm From Medial Instep 45.1 cm 38.3 cm Ankle Left: Right: Point of Measurement: 9 cm From Medial Instep 25.7 cm 22.5 cm Vascular Assessment Pulses: Dorsalis Pedis Palpable: [Left:No] [Right:No] Doppler Audible: [Left:Yes] [Right:Yes] Posterior Tibial Extremity colors, hair growth, and conditions: Extremity Color: [Left:Red] [Right:Red] Temperature of Extremity: [Left:Warm] [Right:Warm] Capillary Refill: [Left:< 3 seconds] [Right:< 3 seconds] Toe Nail Assessment Left: Right: Thick: Yes Yes Discolored: Yes Yes Deformed: Yes Yes Improper Length and Hygiene: No No Electronic  Signature(s) Signed: 10/09/2016 5:09:28 PM By: Alric Quan Entered By: Alric Quan on 10/09/2016 13:11:28 [name] ([id number]) -------------------------------------------------------------------------------- Multi Wound Chart Details Patient Name: Jillian Albe B. Date of Service: 10/09/2016 12:30 PM Medical Record Number: [id number] Patient Account Number: [id number] Date of Birth/Sex: February 20, 1945 (71 y.o. Female) Treating RN: Carolyne Fiscal, Debi Primary Care Jaeden Westbay: Lelon Huh Other Clinician: Referring Maurie Musco: Lelon Huh  Treating Yzabella Crunk/Extender: Frann Rider in Treatment: 1 Vital Signs Height(in): 64 Pulse(bpm): 59 Weight(lbs): 206 Blood Pressure 129/63 (mmHg): Body Mass Index(BMI): 35 Temperature(F): 97.5 Respiratory Rate 16 (breaths/min): Photos: [1:No Photos] [2:No Photos] [3:No Photos] Wound Location: [1:Left Lower Leg - Lateral Left Lower Leg - Anterior Left Lower Leg - Medial] Wounding Event: [1:Gradually Appeared] [2:Gradually Appeared] [3:Gradually Appeared] Primary Etiology: [1: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] [2:the Lower Extremity] [3:the Lower Extremity] Comorbid History: [1:Asthma, Congestive Heart Asthma, Congestive Heart Asthma, Congestive Heart Failure, Coronary Artery Failure, Coronary Artery Failure, Coronary Artery Disease, Hypertension, Type II Diabetes, Osteoarthritis] [2:Disease, Hypertension,  Type II Diabetes, Osteoarthritis] [3:Disease, Hypertension, Type II Diabetes, Osteoarthritis] Date Acquired: [1:08/02/2016] [2:08/02/2016] [3:08/02/2016] Weeks of Treatment: [1:1] [2:1] [3:1] Wound Status: [1:Open] [2:Open] [3:Open] Measurements L x W x D 6.3x4.7x0.2 [2:3.8x2.2x0.1] [3:3.5x2.2x0.1] (cm) Area (cm) : [1:23.256] [2:6.566] [3:6.048] Volume (cm) : [1:4.651] [2:0.657] [3:0.605] % Reduction in Area: [1:-34.60%] [2:16.40%] [3:-10.00%] % Reduction in Volume:  -169.20% [2:16.30%] [3:-10.00%] Classification: [1:Grade 1] [2:Grade 1] [3:Grade 1] Exudate Amount: [1:Large] [2:Large] [3:Large] Exudate Type: [1:Serous] [2:Serous] [3:Serous] Exudate Color: [1:amber] [2:amber] [3:amber] Wound Margin: [1:Distinct, outline attached Distinct, outline attached Distinct, outline attached] Granulation Amount: [1:None Present (0%)] [2:None Present (0%)] [3:None Present (0%)] Necrotic Amount: [1:Large (67-100%)] [2:Large (67-100%)] [3:Large (67-100%)] Necrotic Tissue: [1:Eschar, Adherent Slough Adherent Slough] [3:Eschar, Adherent Slough] Epithelialization: [1:None] [2:None] [3:None] Periwound Skin Texture: No Abnormalities Noted No Abnormalities Noted No Abnormalities Noted [1:Maceration: Yes] [2:Maceration: Yes] [3:Maceration: Yes] Periwound Skin Moisture: Periwound Skin Color: Erythema: Yes Erythema: Yes Erythema: Yes Erythema Location: Circumferential Circumferential Circumferential Temperature: No Abnormality No Abnormality No Abnormality Tenderness on No Yes Yes Palpation: Wound Preparation: Ulcer Cleansing: Ulcer Cleansing: Ulcer Cleansing: Rinsed/Irrigated with Rinsed/Irrigated with Rinsed/Irrigated with Saline, Other: soap and Saline, Other: soap and Saline, Other: soap and water water water Topical Anesthetic Topical Anesthetic Topical Anesthetic Applied: Other: lidocaine Applied: Other: lidocaine Applied: Other: lidocaine 4% 4% 4% Wound Number: [id number] 6  Photos: No Photos No Photos No Photos Wound Location: Right Lower Leg - Anterior Right Lower Leg - Medial Right Lower Leg - Lateral Wounding Event: Gradually Appeared Gradually Appeared Gradually Appeared Primary Etiology: 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 the Lower Extremity the Lower Extremity Comorbid History: Asthma, Congestive Heart Asthma, Congestive Heart Asthma, Congestive Heart Failure, Coronary Artery Failure, Coronary  Artery Failure, Coronary Artery Disease, Hypertension, Disease, Hypertension, Disease, Hypertension, Type II Diabetes, Type II Diabetes, Type II Diabetes, Osteoarthritis Osteoarthritis Osteoarthritis Date Acquired: 08/02/2016 08/02/2016 08/02/2016 Weeks of Treatment: 1 1 1  Wound Status: Open Open Open Measurements L x W x D 0x0x0 0x0x0 4.2x1.5x0.1 (cm) Area (cm) : 0 0 4.948 Volume (cm) : 0 0 0.495 % Reduction in Area: 100.00% 100.00% 21.20% % Reduction in Volume: 100.00% 100.00% 21.20% Classification: Grade 1 Grade 1 Grade 1 Exudate Amount: None Present None Present Large Exudate Type: N/A N/A Serous Exudate Color: N/A N/A amber Wound Margin: Distinct, outline attached Distinct, outline attached Distinct, outline attached Granulation Amount: None Present (0%) None Present (0%) None Present (0%) Necrotic Amount: None Present (0%) None Present (0%) Large (67-100%) Necrotic Tissue: N/A N/A Eschar, Adherent Slough Epithelialization: Large (67-100%) Large (67-100%) None Periwound Skin Texture: No Abnormalities Noted No Abnormalities Noted No Abnormalities Noted Periwound Skin No Abnormalities Noted Maceration: No Maceration: Yes Moisture: Periwound Skin Color: Erythema: Yes Erythema: Yes Erythema: Yes [name], Jillian B. ([id number]) Erythema Location: Circumferential Circumferential Circumferential Temperature: No Abnormality No Abnormality No Abnormality Tenderness  on Yes No Yes Palpation: Wound Preparation: Ulcer Cleansing: Ulcer Cleansing: Ulcer Cleansing: Rinsed/Irrigated with Rinsed/Irrigated with Rinsed/Irrigated with Saline, Other: soap and Saline, Other: soap and Saline, Other: soap and water water water Topical Anesthetic Topical Anesthetic Topical Anesthetic Applied: None Applied: None Applied: Other: lidocaine 4% Treatment Notes Electronic Signature(s) Signed: 10/09/2016 1:39:34 PM By: Christin Fudge MD, FACS Entered By: Christin Fudge on 10/09/2016 13:39:34 [name] ([id number]) -------------------------------------------------------------------------------- [place] Details Patient Name: [name], MCGEACHY. Date of Service: 10/09/2016 12:30 PM Medical Record Number: [id number] Patient Account Number: [id number] Date of Birth/Sex: 05-07-1945 (71 y.o. Female) Treating RN: Ahmed Prima Primary Care Vineeth Fell: Lelon Huh Other Clinician: Referring Rishik Tubby: Lelon Huh Treating Allee Busk/Extender: Frann Rider in Treatment: 1 Active Inactive ` Abuse / Safety / Falls / Self Care Management Nursing Diagnoses: History of Falls Potential for falls Goals: Patient will not experience any injury related to falls Date Initiated: 10/02/2016 Target Resolution Date: 01/06/2017 Goal Status: Active Interventions: Assess: immobility, friction, shearing, incontinence upon admission and as needed Assess impairment of mobility on admission and as needed per policy Assess personal safety and home safety (as indicated) on admission and as needed Notes: ` Nutrition Nursing Diagnoses: Imbalanced nutrition Impaired glucose control: actual or potential Potential for alteratiion in Nutrition/Potential for imbalanced nutrition Goals: Patient/caregiver agrees to and verbalizes understanding of need to use nutritional supplements and/or vitamins as prescribed Date Initiated: 10/02/2016 Target Resolution Date: 02/10/2017 Goal Status: Active Patient/caregiver will maintain therapeutic glucose control Date Initiated: 10/02/2016 Target Resolution Date: 01/06/2017 Goal Status: Active Interventions: [name], [name] ([id number]) Assess patient nutrition upon admission and as needed per policy Provide education on elevated blood sugars and impact on wound healing Provide education on nutrition Treatment Activities: Education provided on Nutrition : 10/02/2016 Notes: ` Orientation to the Wound Care Program Nursing Diagnoses: Knowledge deficit  related to the wound healing center program Goals: Patient/caregiver will verbalize understanding of the [place] [facility] Date Initiated: 10/02/2016 Target Resolution Date: 11/11/2016 Goal Status: Active Interventions: Provide education on orientation to the wound center Notes: ` Pain, Acute or Chronic Nursing Diagnoses: Pain, acute or chronic: actual or potential Potential alteration in comfort, pain Goals: Patient/caregiver will verbalize adequate pain control between visits Date Initiated: 10/02/2016 Target Resolution Date: 02/10/2017 Goal Status: Active Interventions: Complete pain assessment as per visit requirements Notes: ` Wound/Skin Impairment Nursing Diagnoses: Impaired tissue integrity Knowledge deficit related to ulceration/compromised skin integrity [name], [name] ([id number]) Goals: Ulcer/skin breakdown will have a volume reduction of 80% by week 12 Date Initiated: 10/02/2016 Target Resolution Date: 02/03/2017 Goal Status: Active Interventions: Assess patient/caregiver ability to perform ulcer/skin care regimen upon admission and as needed Assess ulceration(s) every visit Notes: Electronic Signature(s) Signed: 10/09/2016 5:09:28 PM By: Alric Quan Entered By: Alric Quan on 10/09/2016 13:11:32 [name] ([id number]) -------------------------------------------------------------------------------- Pain Assessment Details Patient Name: Jillian Albe B. Date of Service: 10/09/2016 12:30 PM Medical Record Number: [id number] Patient Account Number: [id number] Date of Birth/Sex: 1945-09-20 (71 y.o. Female) Treating RN: Ahmed Prima Primary Care Muslima Toppins: Lelon Huh Other Clinician: Referring Rayneisha Bouza: Lelon Huh Treating Lonza Shimabukuro/Extender: Frann Rider in Treatment: 1 Active Problems Location of Pain Severity and Description of Pain Patient Has Paino Yes Site Locations Pain Location: Pain in Ulcers Rate the  pain. Current Pain Level: 10 Character of Pain Describe the Pain: Aching Pain Management and Medication Current Pain Management: Electronic Signature(s) Signed: 10/09/2016 5:09:28 PM By: Alric Quan Entered By: Alric Quan on 10/09/2016 12:46:29 [name] ([id number]) --------------------------------------------------------------------------------  Patient/Caregiver Education Details Patient Name: LEIGHA, OLBERDING. Date of Service: 10/09/2016 12:30 PM Medical Record Number: [id number] Patient Account Number: [id number] Date of Birth/Gender: [birth year] (71 y.o. Female) Treating RN: Ahmed Prima Primary Care Physician: Lelon Huh Other Clinician: Referring Physician: Lelon Huh Treating Physician/Extender: Frann Rider in Treatment: 1 Education Assessment Education Provided To: Patient Education Topics Provided Wound/Skin Impairment: Handouts: Other: change dressing as ordered Methods: Demonstration, Explain/Verbal Responses: State content correctly Electronic Signature(s) Signed: 10/09/2016 5:09:28 PM By: Alric Quan Entered By: Alric Quan on 10/09/2016 14:15:40 [name] ([id number]) -------------------------------------------------------------------------------- Wound Assessment Details Patient Name: MAURIA, ASQUITH B. Date of Service: 10/09/2016 12:30 PM Medical Record Number: [id number] Patient Account Number: [id number] Date of Birth/Sex: [birth year] (72 y.o. Female) Treating RN: Carolyne Fiscal, Debi Primary Care Fayetta Sorenson: Lelon Huh Other Clinician: Referring Jaren Vanetten: Lelon Huh Treating Estefana Taylor/Extender: Frann Rider in Treatment: 1 Wound Status Wound Number: 1 Primary Diabetic Wound/Ulcer of the Lower Etiology: Extremity Wound Location: Left Lower Leg - Lateral Wound Open Wounding Event: Gradually Appeared Status: Date Acquired: 08/02/2016 Comorbid Asthma, Congestive Heart Failure, Weeks Of Treatment:  1 History: Coronary Artery Disease, Hypertension, Clustered Wound: No Type II Diabetes, Osteoarthritis Photos Photo Uploaded By: Alric Quan on 10/09/2016 16:43:07 Wound Measurements Length: (cm) 6.3 Width: (cm) 4.7 Depth: (cm) 0.2 Area: (cm) 23.256 Volume: (cm) 4.651 % Reduction in Area: -34.6% % Reduction in Volume: -169.2% Epithelialization: None Tunneling: No Undermining: No Wound Description Classification: Grade 1 Foul Odor Aft Wound Margin: Distinct, outline attached Slough/Fibrin Exudate Amount: Large Exudate Type: Serous Exudate Color: amber er Cleansing: No o Yes Wound Bed Granulation Amount: None Present (0%) Necrotic Amount: Large (67-100%) Necrotic Quality: Eschar, Adherent [address], [place]. ([id number]) Periwound Skin Texture Texture Color No Abnormalities Noted: No No Abnormalities Noted: No Erythema: Yes Moisture Erythema Location: Circumferential No Abnormalities Noted: No Maceration: Yes Temperature / Pain Temperature: No Abnormality Wound Preparation Ulcer Cleansing: Rinsed/Irrigated with Saline, Other: soap and water, Topical Anesthetic Applied: Other: lidocaine 4%, Treatment Notes Wound #1 (Left, Lateral Lower Leg) 1. Cleansed with: Clean wound with Normal Saline Cleanse wound with antibacterial soap and water 2. Anesthetic Topical Lidocaine 4% cream to wound bed prior to debridement 3. Peri-wound Care: Barrier cream 4. Dressing Applied: Other dressing (specify in notes) 5. Secondary Dressing Applied ABD Pad Dry Gauze Notes kerlix, coban, silvercel, xtrasorb Electronic Signature(s) Signed: 10/09/2016 5:09:28 PM By: Alric Quan Entered By: Alric Quan on 10/09/2016 13:06:48 [name] ([id number]) -------------------------------------------------------------------------------- Wound Assessment Details Patient Name: [name], Jillian B. Date of Service: 10/09/2016 12:30 PM Medical Record Number:  [id number] Patient Account Number: [id number] Date of Birth/Sex: 1945/03/24 (71 y.o. Female) Treating RN: Carolyne Fiscal, Debi Primary Care Melecio Cueto: Lelon Huh Other Clinician: Referring Tynesha Free: Lelon Huh Treating Davida Falconi/Extender: Frann Rider in Treatment: 1 Wound Status Wound Number: 2 Primary Diabetic Wound/Ulcer of the Lower Etiology: Extremity Wound Location: Left Lower Leg - Anterior Wound Open Wounding Event: Gradually Appeared Status: Date Acquired: 08/02/2016 Comorbid Asthma, Congestive Heart Failure, Weeks Of Treatment: 1 History: Coronary Artery Disease, Hypertension, Clustered Wound: No Type II Diabetes, Osteoarthritis Photos Photo Uploaded By: Alric Quan on 10/09/2016 16:43:08 Wound Measurements Length: (cm) 3.8 Width: (cm) 2.2 Depth: (cm) 0.1 Area: (cm) 6.566 Volume: (cm) 0.657 % Reduction in Area: 16.4% % Reduction in Volume: 16.3% Epithelialization: None Tunneling: No Undermining: No Wound Description Classification: Grade 1 Foul Odor Aft Wound Margin: Distinct, outline attached Slough/Fibrin Exudate Amount: Large Exudate Type: Serous Exudate Color: amber er Cleansing: No o Yes Wound Bed Granulation Amount: None  Present (0%) Necrotic Amount: Large (67-100%) Necrotic Quality: Adherent [address] [place]. ([id number]) Periwound Skin Texture Texture Color No Abnormalities Noted: No No Abnormalities Noted: No Erythema: Yes Moisture Erythema Location: Circumferential No Abnormalities Noted: No Maceration: Yes Temperature / Pain Temperature: No Abnormality Tenderness on Palpation: Yes Wound Preparation Ulcer Cleansing: Rinsed/Irrigated with Saline, Other: soap and water, Topical Anesthetic Applied: Other: lidocaine 4%, Treatment Notes Wound #2 (Left, Anterior Lower Leg) 1. Cleansed with: Clean wound with Normal Saline Cleanse wound with antibacterial soap and water 2. Anesthetic Topical Lidocaine 4% cream to wound  bed prior to debridement 3. Peri-wound Care: Barrier cream 4. Dressing Applied: Other dressing (specify in notes) 5. Secondary Dressing Applied ABD Pad Dry Gauze Notes kerlix, coban, silvercel, xtrasorb Electronic Signature(s) Signed: 10/09/2016 5:09:28 PM By: Alric Quan Entered By: Alric Quan on 10/09/2016 13:05:37 [name] ([id number]) -------------------------------------------------------------------------------- Wound Assessment Details Patient Name: [name], Jillian B. Date of Service: 10/09/2016 12:30 PM Medical Record Number: [id number] Patient Account Number: [id number] Date of Birth/Sex: 29-May-1945 (72 y.o. Female) Treating RN: Carolyne Fiscal, Debi Primary Care Bodee Lafoe: Lelon Huh Other Clinician: Referring Oline Belk: Lelon Huh Treating Keijuan Schellhase/Extender: Frann Rider in Treatment: 1 Wound Status Wound Number: 3 Primary Diabetic Wound/Ulcer of the Lower Etiology: Extremity Wound Location: Left Lower Leg - Medial Wound Open Wounding Event: Gradually Appeared Status: Date Acquired: 08/02/2016 Comorbid Asthma, Congestive Heart Failure, Weeks Of Treatment: 1 History: Coronary Artery Disease, Hypertension, Clustered Wound: No Type II Diabetes, Osteoarthritis Photos Photo Uploaded By: Alric Quan on 10/09/2016 16:43:42 Wound Measurements Length: (cm) 3.5 Width: (cm) 2.2 Depth: (cm) 0.1 Area: (cm) 6.048 Volume: (cm) 0.605 % Reduction in Area: -10% % Reduction in Volume: -10% Epithelialization: None Tunneling: No Undermining: No Wound Description Classification: Grade 1 Foul Odor Aft Wound Margin: Distinct, outline attached Slough/Fibrin Exudate Amount: Large Exudate Type: Serous Exudate Color: amber er Cleansing: No o Yes Wound Bed Granulation Amount: None Present (0%) Necrotic Amount: Large (67-100%) Necrotic Quality: Eschar, Adherent [address], [place]. ([id number]) Periwound Skin Texture Texture Color No  Abnormalities Noted: No No Abnormalities Noted: No Erythema: Yes Moisture Erythema Location: Circumferential No Abnormalities Noted: No Maceration: Yes Temperature / Pain Temperature: No Abnormality Tenderness on Palpation: Yes Wound Preparation Ulcer Cleansing: Rinsed/Irrigated with Saline, Other: soap and water, Topical Anesthetic Applied: Other: lidocaine 4%, Treatment Notes Wound #3 (Left, Medial Lower Leg) 1. Cleansed with: Clean wound with Normal Saline Cleanse wound with antibacterial soap and water 2. Anesthetic Topical Lidocaine 4% cream to wound bed prior to debridement 3. Peri-wound Care: Barrier cream 4. Dressing Applied: Other dressing (specify in notes) 5. Secondary Dressing Applied ABD Pad Dry Gauze Notes kerlix, coban, silvercel, xtrasorb Electronic Signature(s) Signed: 10/09/2016 5:09:28 PM By: Alric Quan Entered By: Alric Quan on 10/09/2016 13:03:32 [name] ([id number]) -------------------------------------------------------------------------------- Wound Assessment Details Patient Name: [name], GUTHRIDGE B. Date of Service: 10/09/2016 12:30 PM Medical Record Number: [id number] Patient Account Number: [id number] Date of Birth/Sex: 1945/04/24 (71 y.o. Female) Treating RN: Ahmed Prima Primary Care Adonis Yim: Lelon Huh Other Clinician: Referring Irean Kendricks: Lelon Huh Treating Tome Wilson/Extender: Frann Rider in Treatment: 1 Wound Status Wound Number: 4 Primary Diabetic Wound/Ulcer of the Lower Etiology: Extremity Wound Location: Right Lower Leg - Anterior Wound Open Wounding Event: Gradually Appeared Status: Date Acquired: 08/02/2016 Comorbid Asthma, Congestive Heart Failure, Weeks Of Treatment: 1 History: Coronary Artery Disease, Hypertension, Clustered Wound: No Type II Diabetes, Osteoarthritis Photos Photo Uploaded By: Alric Quan on 10/09/2016 16:43:43 Wound Measurements Length: (cm) 0 % Reducti Width:  (cm) 0 %  Reducti Depth: (cm) 0 Epithelia Area: (cm) 0 Tunnelin Volume: (cm) 0 Undermin on in Area: 100% on in Volume: 100% lization: Large (67-100%) g: No ing: No Wound Description Classification: Grade 1 Wound Margin: Distinct, outline attached Exudate Amount: None Present Foul Odor After Cleansing: No Slough/Fibrino No Wound Bed Granulation Amount: None Present (0%) Necrotic Amount: None Present (0%) Periwound Skin Texture Texture Color No Abnormalities Noted: No No Abnormalities Noted: No MCKAELA, HOWLEY B. ([id number]) Moisture Erythema: Yes No Abnormalities Noted: No Erythema Location: Circumferential Temperature / Pain Temperature: No Abnormality Tenderness on Palpation: Yes Wound Preparation Ulcer Cleansing: Rinsed/Irrigated with Saline, Other: soap and water, Topical Anesthetic Applied: None Electronic Signature(s) Signed: 10/09/2016 5:09:28 PM By: Alric Quan Entered By: Alric Quan on 10/09/2016 13:29:59 [name] ([id number]) -------------------------------------------------------------------------------- Wound Assessment Details Patient Name: Jillian Albe B. Date of Service: 10/09/2016 12:30 PM Medical Record Number: [id number] Patient Account Number: [id number] Date of Birth/Sex: June 17, 1945 (71 y.o. Female) Treating RN: Carolyne Fiscal, Debi Primary Care Jera Headings: Lelon Huh Other Clinician: Referring Raelle Chambers: Lelon Huh Treating Ramari Bray/Extender: Frann Rider in Treatment: 1 Wound Status Wound Number: 5 Primary Diabetic Wound/Ulcer of the Lower Etiology: Extremity Wound Location: Right Lower Leg - Medial Wound Open Wounding Event: Gradually Appeared Status: Date Acquired: 08/02/2016 Comorbid Asthma, Congestive Heart Failure, Weeks Of Treatment: 1 History: Coronary Artery Disease, Hypertension, Clustered Wound: No Type II Diabetes, Osteoarthritis Photos Photo Uploaded By: Alric Quan on 10/09/2016 16:44:24 Wound  Measurements Length: (cm) 0 % Reducti Width: (cm) 0 % Reducti Depth: (cm) 0 Epithelia Area: (cm) 0 Tunnelin Volume: (cm) 0 Undermin on in Area: 100% on in Volume: 100% lization: Large (67-100%) g: No ing: No Wound Description Classification: Grade 1 Wound Margin: Distinct, outline attached Exudate Amount: None Present Foul Odor After Cleansing: No Slough/Fibrino No Wound Bed Granulation Amount: None Present (0%) Necrotic Amount: None Present (0%) Periwound Skin Texture Texture Color No Abnormalities Noted: No No Abnormalities Noted: No KIJUANA, RUPPEL B. ([id number]) Moisture Erythema: Yes No Abnormalities Noted: No Erythema Location: Circumferential Maceration: No Temperature / Pain Temperature: No Abnormality Wound Preparation Ulcer Cleansing: Rinsed/Irrigated with Saline, Other: soap and water, Topical Anesthetic Applied: None Electronic Signature(s) Signed: 10/09/2016 5:09:28 PM By: Alric Quan Entered By: Alric Quan on 10/09/2016 13:30:56 [name] ([id number]) -------------------------------------------------------------------------------- Wound Assessment Details Patient Name: Jillian Albe B. Date of Service: 10/09/2016 12:30 PM Medical Record Number: [id number] Patient Account Number: [id number] Date of Birth/Sex: 1945-07-04 (71 y.o. Female) Treating RN: Carolyne Fiscal, Debi Primary Care Avilene Marrin: Lelon Huh Other Clinician: Referring Bernetta Sutley: Lelon Huh Treating Sayaka Hoeppner/Extender: Frann Rider in Treatment: 1 Wound Status Wound Number: 6 Primary Diabetic Wound/Ulcer of the Lower Etiology: Extremity Wound Location: Right Lower Leg - Lateral Wound Open Wounding Event: Gradually Appeared Status: Date Acquired: 08/02/2016 Comorbid Asthma, Congestive Heart Failure, Weeks Of Treatment: 1 History: Coronary Artery Disease, Hypertension, Clustered Wound: No Type II Diabetes, Osteoarthritis Photos Photo Uploaded By: Alric Quan  on 10/09/2016 16:44:25 Wound Measurements Length: (cm) 4.2 Width: (cm) 1.5 Depth: (cm) 0.1 Area: (cm) 4.948 Volume: (cm) 0.495 % Reduction in Area: 21.2% % Reduction in Volume: 21.2% Epithelialization: None Tunneling: No Undermining: No Wound Description Classification: Grade 1 Foul Odor Aft Wound Margin: Distinct, outline attached Slough/Fibrin Exudate Amount: Large Exudate Type: Serous Exudate Color: amber er Cleansing: No o Yes Wound Bed Granulation Amount: None Present (0%) Necrotic Amount: Large (67-100%) Necrotic Quality: Eschar, Adherent [address] [place]. ([id number]) Periwound Skin Texture Texture Color No Abnormalities Noted: No No Abnormalities Noted: No Erythema: Yes Moisture  Erythema Location: Circumferential No Abnormalities Noted: No Maceration: Yes Temperature / Pain Temperature: No Abnormality Tenderness on Palpation: Yes Wound Preparation Ulcer Cleansing: Rinsed/Irrigated with Saline, Other: soap and water, Topical Anesthetic Applied: Other: lidocaine 4%, Treatment Notes Wound #6 (Right, Lateral Lower Leg) 1. Cleansed with: Clean wound with Normal Saline Cleanse wound with antibacterial soap and water 2. Anesthetic Topical Lidocaine 4% cream to wound bed prior to debridement 3. Peri-wound Care: Barrier cream 4. Dressing Applied: Other dressing (specify in notes) 5. Secondary Dressing Applied ABD Pad Notes unna to anchor, xtrasorb, kerlix, coban, silvercel Electronic Signature(s) Signed: 10/09/2016 5:09:28 PM By: Alric Quan Entered By: Alric Quan on 10/09/2016 [place], [place] ([id number]) -------------------------------------------------------------------------------- [place] Details Patient Name: Jillian Albe B. Date of Service: 10/09/2016 12:30 PM Medical Record Number: [id number] Patient Account Number: [id number] Date of Birth/Sex: 1945-12-29 (71 y.o. Female) Treating RN: Carolyne Fiscal, Debi Primary Care  Guy Toney: Lelon Huh Other Clinician: Referring Niyanna Asch: Lelon Huh Treating Earsie Humm/Extender: Frann Rider in Treatment: 1 Vital Signs Time Taken: 13:14 Temperature (F): 97.5 Height (in): 64 Pulse (bpm): 59 Weight (lbs): 206 Respiratory Rate (breaths/min): 16 Body Mass Index (BMI): 35.4 Blood Pressure (mmHg): 129/63 Reference Range: 80 - 120 mg / dl Electronic Signature(s) Signed: 10/09/2016 5:09:28 PM By: Alric Quan Entered By: Alric Quan on 10/09/2016 13:14:33

## 2016-10-10 NOTE — Progress Notes (Addendum)
MERCER, STALLWORTH ([id number]) Visit Report for 10/09/2016 Chief Complaint Document Details Patient Name: [name], [name]. Date of Service: 10/09/2016 12:30 PM Medical Record Number: [id number] Patient Account Number: [id number] Date of Birth/Sex: [birth year] (71 y.o. Female) Treating RN: Ahmed Prima Primary Care Provider: Lelon Huh Other Clinician: Referring Provider: Lelon Huh Treating Provider/Extender: Frann Rider in Treatment: 1 Information Obtained from: Patient Chief Complaint Patient seen for complaints of Non-Healing Wounds to both lower extremities which she's had for about 2 months with swelling. Electronic Signature(s) Signed: 10/09/2016 1:39:41 PM By: Christin Fudge MD, FACS Entered By: Christin Fudge on 10/09/2016 13:39:41 [name] ([id number]) -------------------------------------------------------------------------------- HPI Details Patient Name: [name], Jillian B. Date of Service: 10/09/2016 12:30 PM Medical Record Number: [id number] Patient Account Number: [id number] Date of Birth/Sex: September 15, 1945 (71 y.o. Female) Treating RN: Ahmed Prima Primary Care Provider: Lelon Huh Other Clinician: Referring Provider: Lelon Huh Treating Provider/Extender: Frann Rider in Treatment: 1 History of Present Illness Location: both lower extremity swelling with ulceration Quality: Patient reports experiencing a sharp pain to affected area(s). Severity: Patient states wound are getting worse. Duration: Patient has had the wound for > 2 months prior to seeking treatment at the wound center Timing: Pain in wound is constant (hurts all the time) and is worse when she elevates her legs Context: The wound would happen gradually Modifying Factors: Other treatment(s) tried include:she has been on ciprofloxacin and has been applying Silvadene ointment Associated Signs and Symptoms: Patient reports having increase swelling. HPI Description: 71 year old  patient who sees her PCP Dr. Lelon Huh was recently evaluated 10 days ago for diabetes mellitus, hypertension, CHF and hyperlipidemia. she also was noted to have ulcerations develop in her legs and she has been applying Silvadene dressings locally. In the past she has refused wound care referrals.her cardiologist Dr. Saunders Revel saw her and put her on 40 mg of furosemide daily. last hemoglobin A1c was 7.7%. she was also placed on ciprofloxacin twice daily for 7 days and a urine culture was recommended. past medical history significant for coronary artery disease, diabetes mellitus, nonischemic cardiomyopathy and pulmonary hypertension. She is also status post heart catheterization and coronary angiography, tubal ligation and breast cyst removal in the past. She has never been a smoker. Patient had arterial studies done which showed bilateral ABIs are artificially elevated due to noncompressible and calcified vessels. Triphasic waveform throughout. Right great toe TBI is elevated while the left is normal. Electronic Signature(s) Signed: 10/09/2016 1:39:47 PM By: Christin Fudge MD, FACS Entered By: Christin Fudge on 10/09/2016 13:39:46 [name] ([id number]) -------------------------------------------------------------------------------- Physical Exam Details Patient Name: [name], Jillian B. Date of Service: 10/09/2016 12:30 PM Medical Record Number: [id number] Patient Account Number: [id number] Date of Birth/Sex: 02-14-1945 (71 y.o. Female) Treating RN: Ahmed Prima Primary Care Provider: Lelon Huh Other Clinician: Referring Provider: Lelon Huh Treating Provider/Extender: Frann Rider in Treatment: 1 Constitutional . Pulse regular. Respirations normal and unlabored. Afebrile. . Eyes Nonicteric. Reactive to light. Ears, Nose, Mouth, and Throat Lips, teeth, and gums WNL.Marland Kitchen Moist mucosa without lesions. Neck supple and nontender. No palpable supraclavicular or cervical  adenopathy. Normal sized without goiter. Respiratory WNL. No retractions.. Cardiovascular Pedal Pulses WNL. No clubbing, cyanosis or edema. Lymphatic No adneopathy. No adenopathy. No adenopathy. Musculoskeletal Adexa without tenderness or enlargement.. Digits and nails w/o clubbing, cyanosis, infection, petechiae, ischemia, or inflammatory conditions.. Integumentary (Hair, Skin) No suspicious lesions. No crepitus or fluctuance. No peri-wound warmth or erythema. No masses.Marland Kitchen Psychiatric Judgement and insight Intact.. No evidence of depression,  anxiety, or agitation.. Notes the lymphedema on the right lower extremity is much better today and the lateral wound is looking cleaner and was washed out with moist saline gauze and no sharp debridement was required. The left lower extremity has a lot of subcutaneous debris and some of it was very friable and bled on touch and silver nitrate stick was used to control this. Electronic Signature(s) Signed: 10/09/2016 1:40:28 PM By: Christin Fudge MD, FACS Entered By: Christin Fudge on 10/09/2016 13:40:28 [name] ([id number]) -------------------------------------------------------------------------------- Physician Orders Details Patient Name: [name], Jillian B. Date of Service: 10/09/2016 12:30 PM Medical Record Number: [id number] Patient Account Number: [id number] Date of Birth/Sex: 31-Aug-1945 (71 y.o. Female) Treating RN: Ahmed Prima Primary Care Provider: Lelon Huh Other Clinician: Referring Provider: Lelon Huh Treating Provider/Extender: Frann Rider in Treatment: 1 Verbal / Phone Orders: Yes Clinician: Pinkerton, Debi Read Back and Verified: Yes Diagnosis Coding Wound Cleansing Wound #1 Left,Lateral Lower Leg o Clean wound with Normal Saline. o Cleanse wound with mild soap and water o May Shower, gently pat wound dry prior to applying new dressing. Wound #2 Left,Anterior Lower Leg o Clean wound with  Normal Saline. o Cleanse wound with mild soap and water o May Shower, gently pat wound dry prior to applying new dressing. Wound #3 Left,Medial Lower Leg o Clean wound with Normal Saline. o Cleanse wound with mild soap and water o May Shower, gently pat wound dry prior to applying new dressing. Wound #6 Right,Lateral Lower Leg o Clean wound with Normal Saline. o Cleanse wound with mild soap and water o May Shower, gently pat wound dry prior to applying new dressing. Anesthetic Wound #1 Left,Lateral Lower Leg o Topical Lidocaine 4% cream applied to wound bed prior to debridement - for clinic use Wound #2 Left,Anterior Lower Leg o Topical Lidocaine 4% cream applied to wound bed prior to debridement - for clinic use Wound #3 Left,Medial Lower Leg o Topical Lidocaine 4% cream applied to wound bed prior to debridement - for clinic use Wound #6 Right,Lateral Lower Leg o Topical Lidocaine 4% cream applied to wound bed prior to debridement - for clinic use Skin Barriers/Peri-Wound Care Wound #1 Left,Lateral Lower Leg o Barrier cream MCKINNA, DEMARS ([id number]) Wound #2 Left,Anterior Lower Leg o Barrier cream Wound #3 Left,Medial Lower Leg o Barrier cream Primary Wound Dressing Wound #1 Left,Lateral Lower Leg o Santyl Ointment Wound #2 Left,Anterior Lower Leg o Santyl Ointment Wound #3 Left,Medial Lower Leg o Santyl Ointment Wound #6 Right,Lateral Lower Leg o Silvercel Non-Adherent Secondary Dressing Wound #1 Left,Lateral Lower Leg o ABD pad o Dry Gauze o XtraSorb Wound #2 Left,Anterior Lower Leg o ABD pad o Dry Gauze o XtraSorb Wound #3 Left,Medial Lower Leg o ABD pad o Dry Gauze o XtraSorb Wound #6 Right,Lateral Lower Leg o ABD pad o Dry Gauze o XtraSorb Dressing Change Frequency Wound #1 Left,Lateral Lower Leg o Change Dressing Monday, Wednesday, Friday - HHRN to change Wednesday and Fridays and pt to  come to [facility] on Mondays. Wound #2 Left,Anterior Lower Leg [name], [name] ([id number]) o Change Dressing Monday, Wednesday, Friday - HHRN to change Wednesday and Fridays and pt to come to [place] on Mondays. Wound #3 Left,Medial Lower Leg o Change Dressing Monday, Wednesday, Friday - HHRN to change Wednesday and Fridays and pt to come to [place] on Mondays. Wound #6 Right,Lateral Lower Leg o Change Dressing Monday, Wednesday, Friday - HHRN to change Wednesday and Fridays and pt  to come to [place] on Mondays. Follow-up Appointments Wound #1 Left,Lateral Lower Leg o Return Appointment in 1 week. Wound #2 Left,Anterior Lower Leg o Return Appointment in 1 week. Wound #3 Left,Medial Lower Leg o Return Appointment in 1 week. Wound #6 Right,Lateral Lower Leg o Return Appointment in 1 week. Edema Control Wound #1 Left,Lateral Lower Leg o Kerlix and Coban - Bilateral o Elevate legs to the level of the heart and pump ankles as often as possible Wound #2 Left,Anterior Lower Leg o Kerlix and Coban - Bilateral o Elevate legs to the level of the heart and pump ankles as often as possible Wound #3 Left,Medial Lower Leg o Kerlix and Coban - Bilateral o Elevate legs to the level of the heart and pump ankles as often as possible Wound #6 Right,Lateral Lower Leg o Kerlix and Coban - Bilateral o Elevate legs to the level of the heart and pump ankles as often as possible Additional Orders / Instructions Wound #1 Left,Lateral Lower Leg o Increase protein intake. Wound #2 Left,Anterior Lower Leg o Increase protein intake. [name], [name] ([id number]) Wound #3 Left,Medial Lower Leg o Increase protein intake. Wound #6 Right,Lateral Lower Leg o Increase protein intake. Home Health Wound #1 [place] Nurse may visit PRN to address patientos wound care  needs. o FACE TO FACE ENCOUNTER: MEDICARE and MEDICAID PATIENTS: I certify that this patient is under my care and that I had a face-to-face encounter that meets the physician face-to-face encounter requirements with this patient on this date. The encounter with the patient was in whole or in part for the following MEDICAL CONDITION: (primary reason for [place]) MEDICAL NECESSITY: I certify, that based on my findings, NURSING services are a medically necessary home health service. HOME BOUND STATUS: I certify that my clinical findings support that this patient is homebound (i.e., Due to illness or injury, pt requires aid of supportive devices such as crutches, cane, wheelchairs, walkers, the use of special transportation or the assistance of another person to leave their place of residence. There is a normal inability to leave the home and doing so requires considerable and taxing effort. Other absences are for medical reasons / religious services and are infrequent or of short duration when for other reasons). o If current dressing causes regression in wound condition, may D/C ordered dressing product/s and apply Normal Saline Moist Dressing daily until next [place] / Other MD appointment. [place] of regression in wound condition at [phone]. o Please direct any NON-WOUND related issues/requests for orders to patient's Primary Care Physician Wound #2 [place] Nurse may visit PRN to address patientos wound care needs. o FACE TO FACE ENCOUNTER: MEDICARE and MEDICAID PATIENTS: I certify that this patient is under my care and that I had a face-to-face encounter that meets the physician face-to-face encounter requirements with this patient on this date. The encounter with the patient was in whole or in part for the following MEDICAL CONDITION: (primary reason for [place]) MEDICAL NECESSITY: I certify, that based on my findings, NURSING services are a medically necessary home health service. HOME BOUND STATUS: I certify that my clinical findings support that this patient is homebound (i.e., Due to illness or injury, pt requires aid of supportive devices such as crutches, cane, wheelchairs, walkers, the use of special transportation or the assistance of another person to leave  their place of residence. There is a normal inability to leave the home and doing so requires considerable and taxing effort. Other absences are for medical reasons / religious services and are infrequent or of short duration when for other reasons). o If current dressing causes regression in wound condition, may D/C ordered dressing product/s and apply Normal Saline Moist Dressing daily until next [place] / Other MD appointment. [place] of regression in wound condition at [phone]. o Please direct any NON-WOUND related issues/requests for orders to patient's Primary Care Physician [name], [name] ([id number]) Wound #3 [place] Visits o Home Health Nurse may visit PRN to address patientos wound care needs. o FACE TO FACE ENCOUNTER: MEDICARE and MEDICAID PATIENTS: I certify that this patient is under my care and that I had a face-to-face encounter that meets the physician face-to-face encounter requirements with this patient on this date. The encounter with the patient was in whole or in part for the following MEDICAL CONDITION: (primary reason for [place]) MEDICAL NECESSITY: I certify, that based on my findings, NURSING services are a medically necessary home health service. HOME BOUND STATUS: I certify that my clinical findings support that this patient is homebound (i.e., Due to illness or injury, pt requires aid of supportive devices such as crutches, cane, wheelchairs, walkers, the  use of special transportation or the assistance of another person to leave their place of residence. There is a normal inability to leave the home and doing so requires considerable and taxing effort. Other absences are for medical reasons / religious services and are infrequent or of short duration when for other reasons). o If current dressing causes regression in wound condition, may D/C ordered dressing product/s and apply Normal Saline Moist Dressing daily until next [place] / Other MD appointment. [place] of regression in wound condition at [phone]. o Please direct any NON-WOUND related issues/requests for orders to patient's Primary Care Physician Wound #6 [place] Nurse may visit PRN to address patientos wound care needs. o FACE TO FACE ENCOUNTER: MEDICARE and MEDICAID PATIENTS: I certify that this patient is under my care and that I had a face-to-face encounter that meets the physician face-to-face encounter requirements with this patient on this date. The encounter with the patient was in whole or in part for the following MEDICAL CONDITION: (primary reason for [place]) MEDICAL NECESSITY: I certify, that based on my findings, NURSING services are a medically necessary home health service. HOME BOUND STATUS: I certify that my clinical findings support that this patient is homebound (i.e., Due to illness or injury, pt requires aid of supportive devices such as crutches, cane, wheelchairs, walkers, the use of special transportation or the assistance of another person to leave their place of residence. There is a normal inability to leave the home and doing so requires considerable and taxing effort. Other absences are for medical reasons / religious services and are infrequent or of short duration when for other reasons). o If current dressing causes regression in wound  condition, may D/C ordered dressing product/s and apply Normal Saline Moist Dressing daily until next [place] / Other MD appointment. [place] of regression in wound condition at [phone]. o Please direct any NON-WOUND related issues/requests for orders to patient's Primary Care Physician Medications-please add to medication list. Wound #1 Left,Lateral Lower Leg o P.O. Antibiotics -  start Doxycycline o Santyl Enzymatic Ointment o Other: - Vitamin C, Zinc, Multivitamin Wound #2 Left,Anterior Lower Leg AVY, BARLETT B. ([id number]) o P.O. Antibiotics - start Doxycycline o Santyl Enzymatic Ointment o Other: - Vitamin C, Zinc, Multivitamin Wound #3 Left,Medial Lower Leg o P.O. Antibiotics - start Doxycycline o Santyl Enzymatic Ointment o Other: - Vitamin C, Zinc, Multivitamin Wound #6 Right,Lateral Lower Leg o P.O. Antibiotics - start Doxycycline o Other: - Vitamin C, Zinc, Multivitamin Patient Medications Allergies: penicillin, hydrochlorothiazide Notifications Medication Indication Start End doxycycline hyclate 10/09/2016 DOSE 1 - oral 100 mg capsule - 1 capsule oral bid Electronic Signature(s) Signed: 10/09/2016 5:09:28 PM By: Alric Quan Signed: 10/10/2016 8:58:20 AM By: Christin Fudge MD, FACS Previous Signature: 10/09/2016 1:38:56 PM Version By: Christin Fudge MD, FACS Entered By: Alric Quan on 10/09/2016 16:29:08 [name] ([id number]) -------------------------------------------------------------------------------- Problem List Details Patient Name: LINDZEY, ZENT B. Date of Service: 10/09/2016 12:30 PM Medical Record Number: [id number] Patient Account Number: [id number] Date of Birth/Sex: 1945-01-29 (71 y.o. Female) Treating RN: Ahmed Prima Primary Care Provider: Lelon Huh Other Clinician: Referring Provider: Lelon Huh Treating Provider/Extender: Frann Rider in Treatment: 1 Active  Problems ICD-10 Encounter Code Description Active Date Diagnosis E11.622 Type 2 diabetes mellitus with other skin ulcer 10/02/2016 Yes I89.0 Lymphedema, not elsewhere classified 10/02/2016 Yes L97.212 Non-pressure chronic ulcer of right calf with fat layer 10/02/2016 Yes exposed L97.222 Non-pressure chronic ulcer of left calf with fat layer 10/02/2016 Yes exposed Inactive Problems Resolved Problems Electronic Signature(s) Signed: 10/09/2016 1:39:28 PM By: Christin Fudge MD, FACS Entered By: Christin Fudge on 10/09/2016 13:39:27 [name] ([id number]) -------------------------------------------------------------------------------- Progress Note Details Patient Name: Jillian Albe B. Date of Service: 10/09/2016 12:30 PM Medical Record Number: [id number] Patient Account Number: [id number] Date of Birth/Sex: Nov 02, 1945 (71 y.o. Female) Treating RN: Ahmed Prima Primary Care Provider: Lelon Huh Other Clinician: Referring Provider: Lelon Huh Treating Provider/Extender: Frann Rider in Treatment: 1 Subjective Chief Complaint Information obtained from Patient Patient seen for complaints of Non-Healing Wounds to both lower extremities which she's had for about 2 months with swelling. History of Present Illness (HPI) The following HPI elements were documented for the patient's wound: Location: both lower extremity swelling with ulceration Quality: Patient reports experiencing a sharp pain to affected area(s). Severity: Patient states wound are getting worse. Duration: Patient has had the wound for > 2 months prior to seeking treatment at the wound center Timing: Pain in wound is constant (hurts all the time) and is worse when she elevates her legs Context: The wound would happen gradually Modifying Factors: Other treatment(s) tried include:she has been on ciprofloxacin and has been applying Silvadene ointment Associated Signs and Symptoms: Patient reports having  increase swelling. 71 year old patient who sees her PCP Dr. Lelon Huh was recently evaluated 10 days ago for diabetes mellitus, hypertension, CHF and hyperlipidemia. she also was noted to have ulcerations develop in her legs and she has been applying Silvadene dressings locally. In the past she has refused wound care referrals.her cardiologist Dr. Saunders Revel saw her and put her on 40 mg of furosemide daily. last hemoglobin A1c was 7.7%. she was also placed on ciprofloxacin twice daily for 7 days and a urine culture was recommended. past medical history significant for coronary artery disease, diabetes mellitus, nonischemic cardiomyopathy and pulmonary hypertension. She is also status post heart catheterization and coronary angiography, tubal ligation and breast cyst removal in the past. She has never been a smoker. Patient had arterial studies done which showed bilateral ABIs are  artificially elevated due to noncompressible and calcified vessels. Triphasic waveform throughout. Right great toe TBI is elevated while the left is normal. [place], Nicloe B. ([id number]) Objective Constitutional Pulse regular. Respirations normal and unlabored. Afebrile. Vitals Time Taken: 1:14 PM, Height: 64 in, Weight: 206 lbs, BMI: 35.4, Temperature: 97.5 F, Pulse: 59 bpm, Respiratory Rate: 16 breaths/min, Blood Pressure: 129/63 mmHg. Eyes Nonicteric. Reactive to light. Ears, Nose, Mouth, and Throat Lips, teeth, and gums WNL.Marland Kitchen Moist mucosa without lesions. Neck supple and nontender. No palpable supraclavicular or cervical adenopathy. Normal sized without goiter. Respiratory WNL. No retractions.. Cardiovascular Pedal Pulses WNL. No clubbing, cyanosis or edema. Lymphatic No adneopathy. No adenopathy. No adenopathy. Musculoskeletal Adexa without tenderness or enlargement.. Digits and nails w/o clubbing, cyanosis, infection, petechiae, ischemia, or inflammatory conditions.Marland Kitchen Psychiatric Judgement and insight  Intact.. No evidence of depression, anxiety, or agitation.. General Notes: the lymphedema on the right lower extremity is much better today and the lateral wound is looking cleaner and was washed out with moist saline gauze and no sharp debridement was required. The left lower extremity has a lot of subcutaneous debris and some of it was very friable and bled on touch and silver nitrate stick was used to control this. Integumentary (Hair, Skin) No suspicious lesions. No crepitus or fluctuance. No peri-wound warmth or erythema. No masses.. Wound #1 status is Open. Original cause of wound was Gradually Appeared. The wound is located on the Left,Lateral Lower Leg. The wound measures 6.3cm length x 4.7cm width x 0.2cm depth; 23.256cm^2 area and 4.651cm^3 volume. There is no tunneling or undermining noted. There is a large amount of serous drainage noted. The wound margin is distinct with the outline attached to the wound base. There is no granulation within the wound bed. There is a large (67-100%) amount of necrotic tissue within the wound bed including Eschar and Adherent Slough. The periwound skin appearance exhibited: Maceration, [name], [name] ([id number]) Erythema. The surrounding wound skin color is noted with erythema which is circumferential. Periwound temperature was noted as No Abnormality. Wound #2 status is Open. Original cause of wound was Gradually Appeared. The wound is located on the Left,Anterior Lower Leg. The wound measures 3.8cm length x 2.2cm width x 0.1cm depth; 6.566cm^2 area and 0.657cm^3 volume. There is no tunneling or undermining noted. There is a large amount of serous drainage noted. The wound margin is distinct with the outline attached to the wound base. There is no granulation within the wound bed. There is a large (67-100%) amount of necrotic tissue within the wound bed including Adherent Slough. The periwound skin appearance exhibited: Maceration, Erythema.  The surrounding wound skin color is noted with erythema which is circumferential. Periwound temperature was noted as No Abnormality. The periwound has tenderness on palpation. Wound #3 status is Open. Original cause of wound was Gradually Appeared. The wound is located on the Left,Medial Lower Leg. The wound measures 3.5cm length x 2.2cm width x 0.1cm depth; 6.048cm^2 area and 0.605cm^3 volume. There is no tunneling or undermining noted. There is a large amount of serous drainage noted. The wound margin is distinct with the outline attached to the wound base. There is no granulation within the wound bed. There is a large (67-100%) amount of necrotic tissue within the wound bed including Eschar and Adherent Slough. The periwound skin appearance exhibited: Maceration, Erythema. The surrounding wound skin color is noted with erythema which is circumferential. Periwound temperature was noted as No Abnormality. The periwound has tenderness on palpation. Wound #4 status is  Open. Original cause of wound was Gradually Appeared. The wound is located on the Right,Anterior Lower Leg. The wound measures 0cm length x 0cm width x 0cm depth; 0cm^2 area and 0cm^3 volume. There is no tunneling or undermining noted. There is a none present amount of drainage noted. The wound margin is distinct with the outline attached to the wound base. There is no granulation within the wound bed. There is no necrotic tissue within the wound bed. The periwound skin appearance exhibited: Erythema. The surrounding wound skin color is noted with erythema which is circumferential. Periwound temperature was noted as No Abnormality. The periwound has tenderness on palpation. Wound #5 status is Open. Original cause of wound was Gradually Appeared. The wound is located on the Right,Medial Lower Leg. The wound measures 0cm length x 0cm width x 0cm depth; 0cm^2 area and 0cm^3 volume. There is no tunneling or undermining noted. There is a  none present amount of drainage noted. The wound margin is distinct with the outline attached to the wound base. There is no granulation within the wound bed. There is no necrotic tissue within the wound bed. The periwound skin appearance exhibited: Erythema. The periwound skin appearance did not exhibit: Maceration. The surrounding wound skin color is noted with erythema which is circumferential. Periwound temperature was noted as No Abnormality. Wound #6 status is Open. Original cause of wound was Gradually Appeared. The wound is located on the Right,Lateral Lower Leg. The wound measures 4.2cm length x 1.5cm width x 0.1cm depth; 4.948cm^2 area and 0.495cm^3 volume. There is no tunneling or undermining noted. There is a large amount of serous drainage noted. The wound margin is distinct with the outline attached to the wound base. There is no granulation within the wound bed. There is a large (67-100%) amount of necrotic tissue within the wound bed including Eschar and Adherent Slough. The periwound skin appearance exhibited: Maceration, Erythema. The surrounding wound skin color is noted with erythema which is circumferential. Periwound temperature was noted as No Abnormality. The periwound has tenderness on palpation. 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 Cleanse wound with mild soap and water May Shower, gently pat wound dry prior to applying new dressing. Wound #2 Left,Anterior Lower Leg: Clean wound with Normal Saline. Cleanse wound with mild soap and water May Shower, gently pat wound dry prior to applying new  dressing. Wound #3 Left,Medial Lower Leg: Clean wound with Normal Saline. Cleanse wound with mild soap and water May Shower, gently pat wound dry prior to applying new dressing. Wound #6 Right,Lateral Lower Leg: Clean wound with Normal Saline. Cleanse wound with mild soap and water May Shower, gently pat wound dry prior to applying new dressing. Anesthetic: Wound #1 Left,Lateral Lower Leg: Topical Lidocaine 4% cream applied to wound bed prior to debridement - for clinic use Wound #2 Left,Anterior Lower Leg: Topical Lidocaine 4% cream applied to wound bed prior to debridement - for clinic use Wound #3 Left,Medial Lower Leg: Topical Lidocaine 4% cream applied to wound bed prior to debridement - for clinic use Wound #6 Right,Lateral Lower Leg: Topical Lidocaine 4% cream applied to wound bed prior to debridement - for clinic use Skin Barriers/Peri-Wound Care: Wound #  1 Left,Lateral Lower Leg: Barrier cream Wound #2 Left,Anterior Lower Leg: Barrier cream Wound #3 Left,Medial Lower Leg: Barrier cream Primary Wound Dressing: TISHAWNA, LAROUCHE ([id number]) Wound #1 Left,Lateral Lower Leg: Santyl Ointment Wound #2 Left,Anterior Lower Leg: Santyl Ointment Wound #3 Left,Medial Lower Leg: Santyl Ointment Wound #6 Right,Lateral Lower Leg: Silvercel Non-Adherent Secondary Dressing: Wound #1 Left,Lateral Lower Leg: ABD pad Dry Gauze XtraSorb Wound #2 Left,Anterior Lower Leg: ABD pad Dry Gauze XtraSorb Wound #3 Left,Medial Lower Leg: ABD pad Dry Gauze XtraSorb Wound #6 Right,Lateral Lower Leg: ABD pad Dry Gauze XtraSorb Dressing Change Frequency: Wound #1 Left,Lateral Lower Leg: Change Dressing Monday, Wednesday, Friday - HHRN to change Wednesday and Fridays and pt to come to [place] on Mondays. Wound #2 Left,Anterior Lower Leg: Change Dressing Monday, Wednesday, Friday - HHRN to change Wednesday and Fridays and pt to come to [place] on Mondays. Wound #3  Left,Medial Lower Leg: Change Dressing Monday, Wednesday, Friday - HHRN to change Wednesday and Fridays and pt to come to [place] on Mondays. Wound #6 Right,Lateral Lower Leg: Change Dressing Monday, Wednesday, Friday - HHRN to change Wednesday and Fridays and pt to come to [place] on Mondays. Follow-up Appointments: Wound #1 Left,Lateral Lower Leg: Return Appointment in 1 week. Wound #2 Left,Anterior Lower Leg: Return Appointment in 1 week. Wound #3 Left,Medial Lower Leg: Return Appointment in 1 week. Wound #6 Right,Lateral Lower Leg: Return Appointment in 1 week. Edema Control: Wound #1 Left,Lateral Lower Leg: Kerlix and Coban - Bilateral Elevate legs to the level of the heart and pump ankles as often as possible [name], Jillian B. ([id number]) Wound #2 Left,Anterior Lower Leg: Kerlix and Coban - Bilateral Elevate legs to the level of the heart and pump ankles as often as possible Wound #3 Left,Medial Lower Leg: Kerlix and Coban - Bilateral Elevate legs to the level of the heart and pump ankles as often as possible Wound #6 Right,Lateral Lower Leg: Kerlix and Coban - Bilateral Elevate legs to the level of the heart and pump ankles as often as possible Additional Orders / Instructions: Wound #1 Left,Lateral Lower Leg: Increase protein intake. Wound #2 Left,Anterior Lower Leg: Increase protein intake. Wound #3 Left,Medial Lower Leg: Increase protein intake. Wound #6 Right,Lateral Lower Leg: Increase protein intake. Home Health: Wound #1 Left,Lateral Lower Leg: [place] Nurse may visit PRN to address patient s wound care needs. FACE TO FACE ENCOUNTER: MEDICARE and MEDICAID PATIENTS: I certify that this patient is under my care and that I had a face-to-face encounter that meets the physician face-to-face encounter requirements with this patient on this date. The encounter with the patient was in whole or in part for  the following MEDICAL CONDITION: (primary reason for [place]) MEDICAL NECESSITY: I certify, that based on my findings, NURSING services are a medically necessary home health service. HOME BOUND STATUS: I certify that my clinical findings support that this patient is homebound (i.e., Due to illness or injury, pt requires aid of supportive devices such as crutches, cane, wheelchairs, walkers, the use of special transportation or the assistance of another person to leave their place of residence. There is a normal inability to leave the home and doing so requires considerable and taxing effort. Other absences are for medical reasons / religious services and are infrequent or of short duration when for other reasons). If current dressing causes regression in wound condition, may D/C ordered dressing product/s and apply Normal Saline Moist Dressing  daily until next [place] / Other MD appointment. [place] of regression in wound condition at [phone]. Please direct any NON-WOUND related issues/requests for orders to patient's Primary Care Physician Wound #2 Left,Anterior Lower Leg: [place] Nurse may visit PRN to address patient s wound care needs. FACE TO FACE ENCOUNTER: MEDICARE and MEDICAID PATIENTS: I certify that this patient is under my care and that I had a face-to-face encounter that meets the physician face-to-face encounter requirements with this patient on this date. The encounter with the patient was in whole or in part for the following MEDICAL CONDITION: (primary reason for [place]) MEDICAL NECESSITY: I certify, that based on my findings, NURSING services are a medically necessary home health service. HOME BOUND STATUS: I certify that my clinical findings support that this patient is homebound (i.e., Due to illness or injury, pt requires aid of supportive devices such as crutches, cane, wheelchairs,  walkers, the use of special transportation or the assistance of another person to leave their place of residence. There is a normal inability to leave the home and doing so requires considerable and taxing effort. Other absences are for medical reasons / religious services and are infrequent or of short duration when for other reasons). If current dressing causes regression in wound condition, may D/C ordered dressing product/s and apply Normal Saline Moist Dressing daily until next Home / Other MD appointment. Notify Wound SHEREESE, BONNIE ([id number]) [place] of regression in wound condition at [phone]. Please direct any NON-WOUND related issues/requests for orders to patient's Primary Care Physician Wound #3 Left,Medial Lower Leg: [place] Nurse may visit PRN to address patient s wound care needs. FACE TO FACE ENCOUNTER: MEDICARE and MEDICAID PATIENTS: I certify that this patient is under my care and that I had a face-to-face encounter that meets the physician face-to-face encounter requirements with this patient on this date. The encounter with the patient was in whole or in part for the following MEDICAL CONDITION: (primary reason for [place]) MEDICAL NECESSITY: I certify, that based on my findings, NURSING services are a medically necessary home health service. HOME BOUND STATUS: I certify that my clinical findings support that this patient is homebound (i.e., Due to illness or injury, pt requires aid of supportive devices such as crutches, cane, wheelchairs, walkers, the use of special transportation or the assistance of another person to leave their place of residence. There is a normal inability to leave the home and doing so requires considerable and taxing effort. Other absences are for medical reasons / religious services and are infrequent or of short duration when for other reasons). If current dressing causes  regression in wound condition, may D/C ordered dressing product/s and apply Normal Saline Moist Dressing daily until next [place] / Other MD appointment. [place] of regression in wound condition at [phone]. Please direct any NON-WOUND related issues/requests for orders to patient's Primary Care Physician Wound #6 Right,Lateral Lower Leg: [place] Nurse may visit PRN to address patient s wound care needs. FACE TO FACE ENCOUNTER: MEDICARE and MEDICAID PATIENTS: I certify that this patient is under my care and that I had a face-to-face encounter that meets the physician face-to-face encounter requirements with this patient on this date. The encounter with the patient was in whole or in part for the following MEDICAL CONDITION: (primary reason for [place]) MEDICAL NECESSITY: I certify,  that based on my findings, NURSING services are a medically necessary home health service. HOME BOUND STATUS: I certify that my clinical findings support that this patient is homebound (i.e., Due to illness or injury, pt requires aid of supportive devices such as crutches, cane, wheelchairs, walkers, the use of special transportation or the assistance of another person to leave their place of residence. There is a normal inability to leave the home and doing so requires considerable and taxing effort. Other absences are for medical reasons / religious services and are infrequent or of short duration when for other reasons). If current dressing causes regression in wound condition, may D/C ordered dressing product/s and apply Normal Saline Moist Dressing daily until next [place] / Other MD appointment. [place] of regression in wound condition at [phone]. Please direct any NON-WOUND related issues/requests for orders to patient's Primary Care Physician Medications-please add to medication list.: Wound #1  Left,Lateral Lower Leg: P.O. Antibiotics - start Doxycycline Santyl Enzymatic Ointment Other: - Vitamin C, Zinc, Multivitamin Wound #2 Left,Anterior Lower Leg: P.O. Antibiotics - start Doxycycline Santyl Enzymatic Ointment Other: - Vitamin C, Zinc, Multivitamin Wound #3 Left,Medial Lower Leg: P.O. Antibiotics - start Doxycycline Santyl Enzymatic Ointment Other: - Vitamin C, Zinc, Multivitamin Wound #6 Right,Lateral Lower Leg: P.O. Antibiotics - start Doxycycline ELIANY, MCCARTER ([id number]) Other: - Vitamin C, Zinc, Multivitamin The following medication(s) was prescribed: doxycycline hyclate oral 100 mg capsule 1 1 capsule oral bid starting 10/09/2016 After review today, I have recommended: 1. Silver alginate to the right lower extremity and Santyl ointment to the left lower extremity to be wrapped with Kerlix and Coban to be changed 3 times a week 2. Elevation and exercise discussed with her in great detail 3. she has completed her course of antibiotics and I have recommended doxycycline 100 mg twice a day for the next 14 days 4. Regular visits to the wound center. she and her husband have read all questions answered and will be agreeable to the treatment plan Electronic Signature(s) Signed: 10/10/2016 8:58:08 AM By: Christin Fudge MD, FACS Previous Signature: 10/09/2016 1:41:30 PM Version By: Christin Fudge MD, FACS Entered By: Christin Fudge on 10/10/2016 08:58:08 [name] ([id number]) -------------------------------------------------------------------------------- SuperBill Details Patient Name: Jillian Albe B. Date of Service: 10/09/2016 Medical Record Number: [id number] Patient Account Number: [id number] Date of Birth/Sex: 11-29-1945 (71 y.o. 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 Facility Procedures CPT4 Code: [id number] Description: [phone] - WOUND CARE VISIT-LEV 5 EST PT Modifier: Quantity: 1 Physician Procedures CPT4 Code: [id number] Description: 54008 - WC PHYS LEVEL 3 - EST PT ICD-10 Description Diagnosis E11.622 Type 2 diabetes mellitus with other skin ulcer I89.0 Lymphedema, not elsewhere classified L97.212 Non-pressure chronic ulcer of right calf with fat L97.222 Non-pressure  chronic ulcer of left calf with fat Modifier: layer exposed layer exposed Quantity: 1 Electronic Signature(s) Signed: 10/09/2016 4:27:05 PM By: Christin Fudge MD, FACS Signed: 10/09/2016 5:09:28 PM By: Alric Quan Previous Signature: 10/09/2016 1:41:53 PM Version By: Christin Fudge MD, FACS Entered By: Alric Quan on 10/09/2016 15:06:01

## 2016-10-11 DIAGNOSIS — Z4800 Encounter for change or removal of nonsurgical wound dressing: Secondary | ICD-10-CM | POA: Diagnosis not present

## 2016-10-11 DIAGNOSIS — Z7984 Long term (current) use of oral hypoglycemic drugs: Secondary | ICD-10-CM | POA: Diagnosis not present

## 2016-10-11 DIAGNOSIS — I429 Cardiomyopathy, unspecified: Secondary | ICD-10-CM | POA: Diagnosis not present

## 2016-10-11 DIAGNOSIS — I110 Hypertensive heart disease with heart failure: Secondary | ICD-10-CM | POA: Diagnosis not present

## 2016-10-11 DIAGNOSIS — L97222 Non-pressure chronic ulcer of left calf with fat layer exposed: Secondary | ICD-10-CM | POA: Diagnosis not present

## 2016-10-11 DIAGNOSIS — I509 Heart failure, unspecified: Secondary | ICD-10-CM | POA: Diagnosis not present

## 2016-10-11 DIAGNOSIS — L97212 Non-pressure chronic ulcer of right calf with fat layer exposed: Secondary | ICD-10-CM | POA: Diagnosis not present

## 2016-10-11 DIAGNOSIS — L97211 Non-pressure chronic ulcer of right calf limited to breakdown of skin: Secondary | ICD-10-CM | POA: Diagnosis not present

## 2016-10-11 DIAGNOSIS — I2510 Atherosclerotic heart disease of native coronary artery without angina pectoris: Secondary | ICD-10-CM | POA: Diagnosis not present

## 2016-10-11 DIAGNOSIS — J45909 Unspecified asthma, uncomplicated: Secondary | ICD-10-CM | POA: Diagnosis not present

## 2016-10-11 DIAGNOSIS — Z79891 Long term (current) use of opiate analgesic: Secondary | ICD-10-CM | POA: Diagnosis not present

## 2016-10-11 DIAGNOSIS — M1990 Unspecified osteoarthritis, unspecified site: Secondary | ICD-10-CM | POA: Diagnosis not present

## 2016-10-11 DIAGNOSIS — I272 Pulmonary hypertension, unspecified: Secondary | ICD-10-CM | POA: Diagnosis not present

## 2016-10-11 DIAGNOSIS — E11622 Type 2 diabetes mellitus with other skin ulcer: Secondary | ICD-10-CM | POA: Diagnosis not present

## 2016-10-13 DIAGNOSIS — M1990 Unspecified osteoarthritis, unspecified site: Secondary | ICD-10-CM | POA: Diagnosis not present

## 2016-10-13 DIAGNOSIS — I272 Pulmonary hypertension, unspecified: Secondary | ICD-10-CM | POA: Diagnosis not present

## 2016-10-13 DIAGNOSIS — Z7984 Long term (current) use of oral hypoglycemic drugs: Secondary | ICD-10-CM | POA: Diagnosis not present

## 2016-10-13 DIAGNOSIS — J45909 Unspecified asthma, uncomplicated: Secondary | ICD-10-CM | POA: Diagnosis not present

## 2016-10-13 DIAGNOSIS — Z79891 Long term (current) use of opiate analgesic: Secondary | ICD-10-CM | POA: Diagnosis not present

## 2016-10-13 DIAGNOSIS — L97222 Non-pressure chronic ulcer of left calf with fat layer exposed: Secondary | ICD-10-CM | POA: Diagnosis not present

## 2016-10-13 DIAGNOSIS — I509 Heart failure, unspecified: Secondary | ICD-10-CM | POA: Diagnosis not present

## 2016-10-13 DIAGNOSIS — E11622 Type 2 diabetes mellitus with other skin ulcer: Secondary | ICD-10-CM | POA: Diagnosis not present

## 2016-10-13 DIAGNOSIS — I429 Cardiomyopathy, unspecified: Secondary | ICD-10-CM | POA: Diagnosis not present

## 2016-10-13 DIAGNOSIS — I2510 Atherosclerotic heart disease of native coronary artery without angina pectoris: Secondary | ICD-10-CM | POA: Diagnosis not present

## 2016-10-13 DIAGNOSIS — L97211 Non-pressure chronic ulcer of right calf limited to breakdown of skin: Secondary | ICD-10-CM | POA: Diagnosis not present

## 2016-10-13 DIAGNOSIS — L97212 Non-pressure chronic ulcer of right calf with fat layer exposed: Secondary | ICD-10-CM | POA: Diagnosis not present

## 2016-10-13 DIAGNOSIS — Z4800 Encounter for change or removal of nonsurgical wound dressing: Secondary | ICD-10-CM | POA: Diagnosis not present

## 2016-10-13 DIAGNOSIS — I110 Hypertensive heart disease with heart failure: Secondary | ICD-10-CM | POA: Diagnosis not present

## 2016-10-16 DIAGNOSIS — J45909 Unspecified asthma, uncomplicated: Secondary | ICD-10-CM | POA: Diagnosis not present

## 2016-10-16 DIAGNOSIS — E11622 Type 2 diabetes mellitus with other skin ulcer: Secondary | ICD-10-CM | POA: Diagnosis not present

## 2016-10-16 DIAGNOSIS — Z7984 Long term (current) use of oral hypoglycemic drugs: Secondary | ICD-10-CM | POA: Diagnosis not present

## 2016-10-16 DIAGNOSIS — I110 Hypertensive heart disease with heart failure: Secondary | ICD-10-CM | POA: Diagnosis not present

## 2016-10-16 DIAGNOSIS — L97212 Non-pressure chronic ulcer of right calf with fat layer exposed: Secondary | ICD-10-CM | POA: Diagnosis not present

## 2016-10-16 DIAGNOSIS — M1990 Unspecified osteoarthritis, unspecified site: Secondary | ICD-10-CM | POA: Diagnosis not present

## 2016-10-16 DIAGNOSIS — Z880 Allergy status to penicillin: Secondary | ICD-10-CM | POA: Diagnosis not present

## 2016-10-16 DIAGNOSIS — I272 Pulmonary hypertension, unspecified: Secondary | ICD-10-CM | POA: Diagnosis not present

## 2016-10-16 DIAGNOSIS — I509 Heart failure, unspecified: Secondary | ICD-10-CM | POA: Diagnosis not present

## 2016-10-16 DIAGNOSIS — I890 Lymphedema, not elsewhere classified: Secondary | ICD-10-CM | POA: Diagnosis not present

## 2016-10-16 DIAGNOSIS — I429 Cardiomyopathy, unspecified: Secondary | ICD-10-CM | POA: Diagnosis not present

## 2016-10-16 DIAGNOSIS — Z79899 Other long term (current) drug therapy: Secondary | ICD-10-CM | POA: Diagnosis not present

## 2016-10-16 DIAGNOSIS — I2510 Atherosclerotic heart disease of native coronary artery without angina pectoris: Secondary | ICD-10-CM | POA: Diagnosis not present

## 2016-10-16 DIAGNOSIS — L97222 Non-pressure chronic ulcer of left calf with fat layer exposed: Secondary | ICD-10-CM | POA: Diagnosis not present

## 2016-10-17 NOTE — Progress Notes (Signed)
REMINGTYN, [name] ([id number]) Visit Report for 10/16/2016 Arrival Information Details Patient Name: [name], [name]. Date of Service: 10/16/2016 9:30 AM Medical Record Number: [id number] Patient Account Number: [id number] Date of Birth/Sex: 02-23-1945 (71 y.o. Female) Treating RN: Montey Hora Primary Care Cheetara Hoge: Lelon Huh Other Clinician: Referring Averill Pons: Lelon Huh Treating Lashane Whelpley/Extender: Suella Grove in Treatment: 2 Visit Information History Since Last Visit Added or deleted any medications: No Patient Arrived: Wheel Chair Any new allergies or adverse reactions: No Arrival Time: 09:27 Had a fall or experienced change in No activities of daily living that may affect Accompanied By: spouse risk of falls: Transfer Assistance: None Signs or symptoms of abuse/neglect since last No Patient Identification Verified: Yes visito Secondary Verification Process Yes Hospitalized since last visit: No Completed: Has Dressing in Place as Prescribed: Yes Patient Requires Transmission-Based No Has Compression in Place as Prescribed: Yes Precautions: Pain Present Now: Yes Patient Has Alerts: Yes Electronic Signature(s) Signed: 10/16/2016 3:07:32 PM By: Montey Hora Entered By: Montey Hora on 10/16/2016 15:07:32 [name] ([id number]) -------------------------------------------------------------------------------- Clinic Level of Care Assessment Details Patient Name: [name] Date of Service: 10/16/2016 9:30 AM Medical Record Number: [id number] Patient Account Number: [id number] Date of Birth/Sex: 11-30-1945 (71 y.o. Female) Treating RN: Carolyne Fiscal, Debi Primary Care Chalyn Amescua: Lelon Huh Other Clinician: Montey Hora Referring [place] Millon: Lelon Huh Treating Yordin Rhoda/Extender: Suella Grove in Treatment: 2 Clinic Level of Care Assessment Items TOOL 4 Quantity Score X - Use when only an EandM is performed on FOLLOW-UP visit 1 0 ASSESSMENTS - Nursing  Assessment / Reassessment X - Reassessment of Co-morbidities (includes updates in patient status) 1 10 X - Reassessment of Adherence to Treatment Plan 1 5 ASSESSMENTS - Wound and Skin Assessment / Reassessment X - Simple Wound Assessment / Reassessment - one wound 1 5 []  - Complex Wound Assessment / Reassessment - multiple wounds 0 []  - Dermatologic / Skin Assessment (not related to wound area) 0 ASSESSMENTS - Focused Assessment []  - Circumferential Edema Measurements - multi extremities 0 []  - Nutritional Assessment / Counseling / Intervention 0 []  - Lower Extremity Assessment (monofilament, tuning fork, pulses) 0 []  - Peripheral Arterial Disease Assessment (using hand held doppler) 0 ASSESSMENTS - Ostomy and/or Continence Assessment and Care []  - Incontinence Assessment and Management 0 []  - Ostomy Care Assessment and Management (repouching, etc.) 0 PROCESS - Coordination of Care X - Simple Patient / Family Education for ongoing care 1 15 []  - Complex (extensive) Patient / Family Education for ongoing care 0 []  - Staff obtains Programmer, systems, Records, Test Results / Process Orders 0 []  - Staff telephones HHA, Nursing Homes / Clarify orders / etc 0 []  - Routine Transfer to another Facility (non-emergent condition) 0 [name], [name] ([id number]) []  - Routine Hospital Admission (non-emergent condition) 0 []  - New Admissions / Biomedical engineer / Ordering NPWT, Apligraf, etc. 0 []  - Emergency Hospital Admission (emergent condition) 0 X - Simple Discharge Coordination 1 10 []  - Complex (extensive) Discharge Coordination 0 PROCESS - Special Needs []  - Pediatric / Minor Patient Management 0 []  - Isolation Patient Management 0 []  - Hearing / Language / Visual special needs 0 []  - Assessment of Community assistance (transportation, D/C planning, etc.) 0 []  - Additional assistance / Altered mentation 0 []  - Support Surface(s) Assessment (bed, cushion, seat, etc.) 0 INTERVENTIONS - Wound  Cleansing / Measurement X - Simple Wound Cleansing - one wound 1 5 []  - Complex Wound Cleansing - multiple wounds 0 X - Wound Imaging (photographs - any number  of wounds) 1 5 []  - Wound Tracing (instead of photographs) 0 X - Simple Wound Measurement - one wound 1 5 []  - Complex Wound Measurement - multiple wounds 0 INTERVENTIONS - Wound Dressings []  - Small Wound Dressing one or multiple wounds 0 []  - Medium Wound Dressing one or multiple wounds 0 X - Large Wound Dressing one or multiple wounds 2 20 []  - Application of Medications - topical 0 []  - Application of Medications - injection 0 INTERVENTIONS - Miscellaneous []  - External ear exam 0 [name], Jillian B. ([id number]) []  - Specimen Collection (cultures, biopsies, blood, body fluids, etc.) 0 []  - Specimen(s) / Culture(s) sent or taken to Lab for analysis 0 []  - Patient Transfer (multiple staff / Harrel Lemon Lift / Similar devices) 0 []  - Simple Staple / Suture removal (25 or less) 0 []  - Complex Staple / Suture removal (26 or more) 0 []  - Hypo / Hyperglycemic Management (close monitor of Blood Glucose) 0 []  - Ankle / Brachial Index (ABI) - do not check if billed separately 0 []  - Vital Signs 0 Has the patient been seen at the hospital within the last three years: Yes Total Score: 100 Level Of Care: New/Established - Level 3 Electronic Signature(s) Signed: 10/16/2016 4:50:17 PM By: Montey Hora Entered By: Montey Hora on 10/16/2016 15:08:57 [name] ([id number]) -------------------------------------------------------------------------------- Encounter Discharge Information Details Patient Name: [name], Jillian B. Date of Service: 10/16/2016 9:30 AM Medical Record Number: [id number] Patient Account Number: [id number] Date of Birth/Sex: 04-Aug-1945 (71 y.o. Female) Treating RN: Ahmed Prima Primary Care Jemal Miskell: Lelon Huh Other Clinician: Montey Hora Referring Devlin Mcveigh: Lelon Huh Treating  Sharmayne Jablon/Extender: Suella Grove in Treatment: 2 Encounter Discharge Information Items Discharge Pain Level: 0 Discharge Condition: Stable Ambulatory Status: Wheelchair Discharge Destination: Home Private Transportation: Auto Accompanied By: husband Schedule Follow-up Appointment: Yes Medication Reconciliation completed and No provided to Patient/Care Emett Stapel: Clinical Summary of Care: Electronic Signature(s) Signed: 10/16/2016 3:08:50 PM By: Montey Hora Entered By: Montey Hora on 10/16/2016 15:08:50 [name] ([id number]) -------------------------------------------------------------------------------- Pain Assessment Details Patient Name: Jillian Albe B. Date of Service: 10/16/2016 9:30 AM Medical Record Number: [id number] Patient Account Number: [id number] Date of Birth/Sex: February 13, 1945 (71 y.o. Female) Treating RN: Montey Hora Primary Care Bayard More: Lelon Huh Other Clinician: Montey Hora Referring Meggan Dhaliwal: Lelon Huh Treating Nitisha Civello/Extender: Suella Grove in Treatment: 2 Active Problems Location of Pain Severity and Description of Pain Patient Has Paino Yes Site Locations Pain Location: Pain in Ulcers With Dressing Change: Yes Duration of the Pain. Constant / Intermittento Constant Pain Management and Medication Current Pain Management: Notes Topical or injectable lidocaine is offered to patient for acute pain when surgical debridement is performed. If needed, Patient is instructed to use over the counter pain medication for the following 24-48 hours after debridement. Wound care MDs do not prescribed pain medications. Patient has chronic pain or uncontrolled pain. Patient has been instructed to make an appointment with their Primary Care Physician for pain management. Electronic Signature(s) Signed: 10/16/2016 3:07:43 PM By: Montey Hora Entered By: Montey Hora on 10/16/2016 15:07:43 [name]  ([id number]) -------------------------------------------------------------------------------- Patient/Caregiver Education Details Patient Name: [name], [name] 10/16/2016 9:30 Date of Service: AM Medical Record [id number] Number: Patient Account Number: [id number] [birth year] (71 y.o. Treating RN: Ahmed Prima Date of Birth/Gender: Female) Other Clinician: Montey Hora Primary Care Physician: Lelon Huh Treating Referring Physician: Lelon Huh Physician/Extender: Suella Grove in Treatment: 2 Education Assessment Education Provided To: Patient Education Topics Provided Wound/Skin Impairment: Handouts: Other: do not get dressing wet Methods: Demonstration, Explain/Verbal  Responses: State content correctly Electronic Signature(s) Signed: 10/16/2016 4:50:17 PM By: Montey Hora Entered By: Montey Hora on 10/16/2016 15:08:43 [name] ([id number]) -------------------------------------------------------------------------------- Wound Assessment Details Patient Name: [name], Jillian B. Date of Service: 10/16/2016 9:30 AM Medical Record Number: [id number] Patient Account Number: [id number] Date of Birth/Sex: [birth year] (71 y.o. Female) Treating RN: Montey Hora Primary Care Dwayna Kentner: Lelon Huh Other Clinician: Montey Hora Referring Neamiah Sciarra: Lelon Huh Treating Milynn Quirion/Extender: Frann Rider in Treatment: 2 Wound Status Wound Number: 1 Primary Diabetic Wound/Ulcer of the Lower Etiology: Extremity Wound Location: Left Lower Leg - Lateral Wound Open Wounding Event: Gradually Appeared Status: Date Acquired: 08/02/2016 Comorbid Asthma, Congestive Heart Failure, Weeks Of Treatment: 2 History: Coronary Artery Disease, Clustered Wound: No Hypertension, Type II Diabetes, Osteoarthritis Photos Photo Uploaded By: Alric Quan on 10/16/2016 16:42:31 Wound Measurements Length: (cm) 6.5 Width: (cm) 4.7 Depth: (cm) 0.2 Area: (cm) 23.994 Volume:  (cm) 4.799 % Reduction in Area: -38.9% % Reduction in Volume: -177.7% Epithelialization: None Tunneling: No Undermining: No Wound Description Classification: Grade 1 Foul Odor Aft Wound Margin: Distinct, outline attached Slough/Fibrin Exudate Amount: Large Exudate Type: Serosanguineous Exudate Color: red, brown er Cleansing: No o Yes Wound Bed Granulation Amount: None Present (0%) Necrotic Amount: Large (67-100%) Necrotic Quality: Eschar, Adherent [address], [place] B. ([id number]) Periwound Skin Texture Texture Color No Abnormalities Noted: No No Abnormalities Noted: No Erythema: Yes Moisture Erythema Location: Circumferential No Abnormalities Noted: No Maceration: Yes Temperature / Pain Temperature: No Abnormality Wound Preparation Ulcer Cleansing: Rinsed/Irrigated with Saline, Other: soap and water, Topical Anesthetic Applied: None Treatment Notes Wound #1 (Left, Lateral Lower Leg) 1. Cleansed with: Clean wound with Normal Saline Cleanse wound with antibacterial soap and water 4. Dressing Applied: Santyl Ointment 5. Secondary Dressing Applied ABD Pad Dry Gauze 7. Secured with Tape Notes xtrasorb, kerlix, Event organiser) Signed: 10/16/2016 4:50:17 PM By: Montey Hora Entered By: Montey Hora on 10/16/2016 09:36:58 [name], [name] ([id number]) -------------------------------------------------------------------------------- Wound Assessment Details Patient Name: [name], Jillian B. Date of Service: 10/16/2016 9:30 AM Medical Record Number: [id number] Patient Account Number: [id number] Date of Birth/Sex: 26-Aug-1945 (71 y.o. Female) Treating RN: Montey Hora Primary Care Fawzi Melman: Lelon Huh Other Clinician: Montey Hora Referring Trenden Hazelrigg: Lelon Huh Treating Tane Biegler/Extender: Frann Rider in Treatment: 2 Wound Status Wound Number: 2 Primary Diabetic Wound/Ulcer of the Lower Etiology: Extremity Wound Location: Left  Lower Leg - Anterior Wound Open Wounding Event: Gradually Appeared Status: Date Acquired: 08/02/2016 Comorbid Asthma, Congestive Heart Failure, Weeks Of Treatment: 2 History: Coronary Artery Disease, Clustered Wound: No Hypertension, Type II Diabetes, Osteoarthritis Photos Photo Uploaded By: Alric Quan on 10/16/2016 16:42:31 Wound Measurements Length: (cm) 8.5 Width: (cm) 3.5 Depth: (cm) 0.1 Area: (cm) 23.366 Volume: (cm) 2.337 % Reduction in Area: -197.5% % Reduction in Volume: -197.7% Epithelialization: None Tunneling: No Undermining: No Wound Description Classification: Grade 1 Foul Odor Aft Wound Margin: Distinct, outline attached Slough/Fibrin Exudate Amount: Large Exudate Type: Serous Exudate Color: amber er Cleansing: No o Yes Wound Bed Granulation Amount: None Present (0%) Necrotic Amount: Large (67-100%) Necrotic Quality: Adherent [address] [place] B. ([id number]) Periwound Skin Texture Texture Color No Abnormalities Noted: No No Abnormalities Noted: No Erythema: Yes Moisture Erythema Location: Circumferential No Abnormalities Noted: No Maceration: Yes Temperature / Pain Temperature: No Abnormality Tenderness on Palpation: Yes Wound Preparation Ulcer Cleansing: Rinsed/Irrigated with Saline, Other: soap and water, Topical Anesthetic Applied: None Treatment Notes Wound #2 (Left, Anterior Lower Leg) 1. Cleansed with: Clean wound with Normal Saline Cleanse wound with antibacterial soap and  water 4. Dressing Applied: Santyl Ointment 5. Secondary Dressing Applied ABD Pad Dry Gauze 7. Secured with Tape Notes xtrasorb, kerlix, Event organiser) Signed: 10/16/2016 4:50:17 PM By: Montey Hora Entered By: Montey Hora on 10/16/2016 09:37:48 [name], [name] ([id number]) -------------------------------------------------------------------------------- Wound Assessment Details Patient Name: [name], [name] B. Date of Service:  10/16/2016 9:30 AM Medical Record Number: [id number] Patient Account Number: [id number] Date of Birth/Sex: Jun 26, 1945 (71 y.o. Female) Treating RN: Montey Hora Primary Care Tinsley Lomas: Lelon Huh Other Clinician: Montey Hora Referring Ellinore Merced: Lelon Huh Treating Nanette Wirsing/Extender: Frann Rider in Treatment: 2 Wound Status Wound Number: 3 Primary Diabetic Wound/Ulcer of the Lower Etiology: Extremity Wound Location: Left Lower Leg - Medial Wound Open Wounding Event: Gradually Appeared Status: Date Acquired: 08/02/2016 Comorbid Asthma, Congestive Heart Failure, Weeks Of Treatment: 2 History: Coronary Artery Disease, Clustered Wound: No Hypertension, Type II Diabetes, Osteoarthritis Photos Photo Uploaded By: Alric Quan on 10/16/2016 16:43:21 Wound Measurements Length: (cm) 3.1 Width: (cm) 2.1 Depth: (cm) 0.1 Area: (cm) 5.113 Volume: (cm) 0.511 % Reduction in Area: 7% % Reduction in Volume: 7.1% Epithelialization: None Tunneling: No Undermining: No Wound Description Classification: Grade 1 Foul Odor Aft Wound Margin: Distinct, outline attached Slough/Fibrin Exudate Amount: Large Exudate Type: Serous Exudate Color: amber er Cleansing: No o Yes Wound Bed Granulation Amount: None Present (0%) Necrotic Amount: Large (67-100%) Necrotic Quality: Eschar, Adherent [address] [place]. ([id number]) Periwound Skin Texture Texture Color No Abnormalities Noted: No No Abnormalities Noted: No Erythema: Yes Moisture Erythema Location: Circumferential No Abnormalities Noted: No Maceration: Yes Temperature / Pain Temperature: No Abnormality Tenderness on Palpation: Yes Wound Preparation Ulcer Cleansing: Rinsed/Irrigated with Saline, Other: soap and water, Topical Anesthetic Applied: None Treatment Notes Wound #3 (Left, Medial Lower Leg) 1. Cleansed with: Clean wound with Normal Saline Cleanse wound with antibacterial soap and water 4.  Dressing Applied: Santyl Ointment 5. Secondary Dressing Applied ABD Pad Dry Gauze 7. Secured with Tape Notes xtrasorb, kerlix, Event organiser) Signed: 10/16/2016 4:50:17 PM By: Montey Hora Entered By: Montey Hora on 10/16/2016 09:38:10 [name] ([id number]) -------------------------------------------------------------------------------- Wound Assessment Details Patient Name: IRANIA, DURELL B. Date of Service: 10/16/2016 9:30 AM Medical Record Number: [id number] Patient Account Number: [id number] Date of Birth/Sex: 1945/05/13 (71 y.o. Female) Treating RN: Montey Hora Primary Care Zackaria Burkey: Lelon Huh Other Clinician: Montey Hora Referring Dusten Ellinwood: Lelon Huh Treating Zenith Lamphier/Extender: Frann Rider in Treatment: 2 Wound Status Wound Number: 6 Primary Diabetic Wound/Ulcer of the Lower Etiology: Extremity Wound Location: Right Lower Leg - Lateral Wound Open Wounding Event: Gradually Appeared Status: Date Acquired: 08/02/2016 Comorbid Asthma, Congestive Heart Failure, Weeks Of Treatment: 2 History: Coronary Artery Disease, Clustered Wound: No Hypertension, Type II Diabetes, Osteoarthritis Photos Photo Uploaded By: Alric Quan on 10/16/2016 16:43:21 Wound Measurements Length: (cm) 3.5 Width: (cm) 1 Depth: (cm) 0.1 Area: (cm) 2.749 Volume: (cm) 0.275 % Reduction in Area: 56.2% % Reduction in Volume: 56.2% Epithelialization: None Tunneling: No Undermining: No Wound Description Classification: Grade 1 Foul Odor Aft Wound Margin: Distinct, outline attached Slough/Fibrin Exudate Amount: Small Exudate Type: Serous Exudate Color: amber er Cleansing: No o Yes Wound Bed Granulation Amount: None Present (0%) Necrotic Amount: Large (67-100%) Necrotic Quality: KIAUNA, ZYWICKI. ([id number]) Periwound Skin Texture Texture Color No Abnormalities Noted: No No Abnormalities Noted: No Erythema:  Yes Moisture Erythema Location: Circumferential No Abnormalities Noted: No Maceration: Yes Temperature / Pain Temperature: No Abnormality Tenderness on Palpation: Yes Wound Preparation Ulcer Cleansing: Rinsed/Irrigated with Saline, Other: soap and water, Topical Anesthetic Applied: None Treatment  Notes Wound #6 (Right, Lateral Lower Leg) 1. Cleansed with: Clean wound with Normal Saline Cleanse wound with antibacterial soap and water 4. Dressing Applied: Other dressing (specify in notes) 5. Secondary Dressing Applied ABD Pad 7. Secured with Tape Notes xtrasorb, kerlix, coban, silvercel Electronic Signature(s) Signed: 10/16/2016 4:50:17 PM By: Montey Hora Entered By: Montey Hora on 10/16/2016 09:38:47

## 2016-10-18 DIAGNOSIS — I2510 Atherosclerotic heart disease of native coronary artery without angina pectoris: Secondary | ICD-10-CM | POA: Diagnosis not present

## 2016-10-18 DIAGNOSIS — M1990 Unspecified osteoarthritis, unspecified site: Secondary | ICD-10-CM | POA: Diagnosis not present

## 2016-10-18 DIAGNOSIS — I509 Heart failure, unspecified: Secondary | ICD-10-CM | POA: Diagnosis not present

## 2016-10-18 DIAGNOSIS — Z79891 Long term (current) use of opiate analgesic: Secondary | ICD-10-CM | POA: Diagnosis not present

## 2016-10-18 DIAGNOSIS — L97212 Non-pressure chronic ulcer of right calf with fat layer exposed: Secondary | ICD-10-CM | POA: Diagnosis not present

## 2016-10-18 DIAGNOSIS — L97222 Non-pressure chronic ulcer of left calf with fat layer exposed: Secondary | ICD-10-CM | POA: Diagnosis not present

## 2016-10-18 DIAGNOSIS — I272 Pulmonary hypertension, unspecified: Secondary | ICD-10-CM | POA: Diagnosis not present

## 2016-10-18 DIAGNOSIS — I429 Cardiomyopathy, unspecified: Secondary | ICD-10-CM | POA: Diagnosis not present

## 2016-10-18 DIAGNOSIS — Z7984 Long term (current) use of oral hypoglycemic drugs: Secondary | ICD-10-CM | POA: Diagnosis not present

## 2016-10-18 DIAGNOSIS — L97211 Non-pressure chronic ulcer of right calf limited to breakdown of skin: Secondary | ICD-10-CM | POA: Diagnosis not present

## 2016-10-18 DIAGNOSIS — I110 Hypertensive heart disease with heart failure: Secondary | ICD-10-CM | POA: Diagnosis not present

## 2016-10-18 DIAGNOSIS — Z4800 Encounter for change or removal of nonsurgical wound dressing: Secondary | ICD-10-CM | POA: Diagnosis not present

## 2016-10-18 DIAGNOSIS — E11622 Type 2 diabetes mellitus with other skin ulcer: Secondary | ICD-10-CM | POA: Diagnosis not present

## 2016-10-18 DIAGNOSIS — J45909 Unspecified asthma, uncomplicated: Secondary | ICD-10-CM | POA: Diagnosis not present

## 2016-10-20 DIAGNOSIS — I110 Hypertensive heart disease with heart failure: Secondary | ICD-10-CM | POA: Diagnosis not present

## 2016-10-20 DIAGNOSIS — J45909 Unspecified asthma, uncomplicated: Secondary | ICD-10-CM | POA: Diagnosis not present

## 2016-10-20 DIAGNOSIS — I429 Cardiomyopathy, unspecified: Secondary | ICD-10-CM | POA: Diagnosis not present

## 2016-10-20 DIAGNOSIS — Z79891 Long term (current) use of opiate analgesic: Secondary | ICD-10-CM | POA: Diagnosis not present

## 2016-10-20 DIAGNOSIS — M1990 Unspecified osteoarthritis, unspecified site: Secondary | ICD-10-CM | POA: Diagnosis not present

## 2016-10-20 DIAGNOSIS — L97211 Non-pressure chronic ulcer of right calf limited to breakdown of skin: Secondary | ICD-10-CM | POA: Diagnosis not present

## 2016-10-20 DIAGNOSIS — I272 Pulmonary hypertension, unspecified: Secondary | ICD-10-CM | POA: Diagnosis not present

## 2016-10-20 DIAGNOSIS — L97212 Non-pressure chronic ulcer of right calf with fat layer exposed: Secondary | ICD-10-CM | POA: Diagnosis not present

## 2016-10-20 DIAGNOSIS — E11622 Type 2 diabetes mellitus with other skin ulcer: Secondary | ICD-10-CM | POA: Diagnosis not present

## 2016-10-20 DIAGNOSIS — I2510 Atherosclerotic heart disease of native coronary artery without angina pectoris: Secondary | ICD-10-CM | POA: Diagnosis not present

## 2016-10-20 DIAGNOSIS — I509 Heart failure, unspecified: Secondary | ICD-10-CM | POA: Diagnosis not present

## 2016-10-20 DIAGNOSIS — Z4800 Encounter for change or removal of nonsurgical wound dressing: Secondary | ICD-10-CM | POA: Diagnosis not present

## 2016-10-20 DIAGNOSIS — L97222 Non-pressure chronic ulcer of left calf with fat layer exposed: Secondary | ICD-10-CM | POA: Diagnosis not present

## 2016-10-20 DIAGNOSIS — Z7984 Long term (current) use of oral hypoglycemic drugs: Secondary | ICD-10-CM | POA: Diagnosis not present

## 2016-10-23 ENCOUNTER — Encounter: Payer: Medicare Other | Admitting: Surgery

## 2016-10-23 DIAGNOSIS — I272 Pulmonary hypertension, unspecified: Secondary | ICD-10-CM | POA: Diagnosis not present

## 2016-10-23 DIAGNOSIS — Z880 Allergy status to penicillin: Secondary | ICD-10-CM | POA: Diagnosis not present

## 2016-10-23 DIAGNOSIS — I509 Heart failure, unspecified: Secondary | ICD-10-CM | POA: Diagnosis not present

## 2016-10-23 DIAGNOSIS — L97212 Non-pressure chronic ulcer of right calf with fat layer exposed: Secondary | ICD-10-CM | POA: Diagnosis not present

## 2016-10-23 DIAGNOSIS — Z7984 Long term (current) use of oral hypoglycemic drugs: Secondary | ICD-10-CM | POA: Diagnosis not present

## 2016-10-23 DIAGNOSIS — I2510 Atherosclerotic heart disease of native coronary artery without angina pectoris: Secondary | ICD-10-CM | POA: Diagnosis not present

## 2016-10-23 DIAGNOSIS — I890 Lymphedema, not elsewhere classified: Secondary | ICD-10-CM | POA: Diagnosis not present

## 2016-10-23 DIAGNOSIS — E11622 Type 2 diabetes mellitus with other skin ulcer: Secondary | ICD-10-CM | POA: Diagnosis not present

## 2016-10-23 DIAGNOSIS — L97222 Non-pressure chronic ulcer of left calf with fat layer exposed: Secondary | ICD-10-CM | POA: Diagnosis not present

## 2016-10-23 DIAGNOSIS — I429 Cardiomyopathy, unspecified: Secondary | ICD-10-CM | POA: Diagnosis not present

## 2016-10-23 DIAGNOSIS — M1990 Unspecified osteoarthritis, unspecified site: Secondary | ICD-10-CM | POA: Diagnosis not present

## 2016-10-23 DIAGNOSIS — L97229 Non-pressure chronic ulcer of left calf with unspecified severity: Secondary | ICD-10-CM | POA: Diagnosis not present

## 2016-10-23 DIAGNOSIS — Z79899 Other long term (current) drug therapy: Secondary | ICD-10-CM | POA: Diagnosis not present

## 2016-10-23 DIAGNOSIS — L97829 Non-pressure chronic ulcer of other part of left lower leg with unspecified severity: Secondary | ICD-10-CM | POA: Diagnosis not present

## 2016-10-23 DIAGNOSIS — J45909 Unspecified asthma, uncomplicated: Secondary | ICD-10-CM | POA: Diagnosis not present

## 2016-10-23 DIAGNOSIS — I110 Hypertensive heart disease with heart failure: Secondary | ICD-10-CM | POA: Diagnosis not present

## 2016-10-24 DIAGNOSIS — I110 Hypertensive heart disease with heart failure: Secondary | ICD-10-CM

## 2016-10-24 DIAGNOSIS — L97211 Non-pressure chronic ulcer of right calf limited to breakdown of skin: Secondary | ICD-10-CM | POA: Diagnosis not present

## 2016-10-24 DIAGNOSIS — L97222 Non-pressure chronic ulcer of left calf with fat layer exposed: Secondary | ICD-10-CM | POA: Diagnosis not present

## 2016-10-24 DIAGNOSIS — E11622 Type 2 diabetes mellitus with other skin ulcer: Secondary | ICD-10-CM | POA: Diagnosis not present

## 2016-10-24 DIAGNOSIS — L97212 Non-pressure chronic ulcer of right calf with fat layer exposed: Secondary | ICD-10-CM | POA: Diagnosis not present

## 2016-10-24 DIAGNOSIS — I509 Heart failure, unspecified: Secondary | ICD-10-CM | POA: Diagnosis not present

## 2016-10-24 DIAGNOSIS — I2510 Atherosclerotic heart disease of native coronary artery without angina pectoris: Secondary | ICD-10-CM | POA: Diagnosis not present

## 2016-10-24 NOTE — Progress Notes (Signed)
JEVAEH, SHAMS ([id number]) Visit Report for 10/23/2016 Chief Complaint Document Details Patient Name: [name], [name]. Date of Service: 10/23/2016 8:45 AM Medical Record Number: [id number] Patient Account Number: [id number] Date of Birth/Sex: 1945-09-24 (71 y.o. Female) Treating RN: Ahmed Prima Primary Care Provider: Lelon Huh Other Clinician: Referring Provider: Lelon Huh Treating Provider/Extender: Frann Rider in Treatment: 3 Information Obtained from: Patient Chief Complaint Patient seen for complaints of Non-Healing Wounds to both lower extremities which she's had for about 2 months with swelling. Electronic Signature(s) Signed: 10/23/2016 10:12:54 AM By: Christin Fudge MD, FACS Entered By: Christin Fudge on 10/23/2016 10:12:53 [name] ([id number]) -------------------------------------------------------------------------------- HPI Details Patient Name: [name], Jillian B. Date of Service: 10/23/2016 8:45 AM Medical Record Number: [id number] Patient Account Number: [id number] Date of Birth/Sex: November 07, 1945 (71 y.o. Female) Treating RN: Ahmed Prima Primary Care Provider: Lelon Huh Other Clinician: Referring Provider: Lelon Huh Treating Provider/Extender: Frann Rider in Treatment: 3 History of Present Illness Location: both lower extremity swelling with ulceration Quality: Patient reports experiencing a sharp pain to affected area(s). Severity: Patient states wound are getting worse. Duration: Patient has had the wound for > 2 months prior to seeking treatment at the wound center Timing: Pain in wound is constant (hurts all the time) and is worse when she elevates her legs Context: The wound would happen gradually Modifying Factors: Other treatment(s) tried include:she has been on ciprofloxacin and has been applying Silvadene ointment Associated Signs and Symptoms: Patient reports having increase swelling. HPI Description: 71 year old  patient who sees her PCP Dr. Lelon Huh was recently evaluated 10 days ago for diabetes mellitus, hypertension, CHF and hyperlipidemia. she also was noted to have ulcerations develop in her legs and she has been applying Silvadene dressings locally. In the past she has refused wound care referrals.her cardiologist Dr. Saunders Revel saw her and put her on 40 mg of furosemide daily. last hemoglobin A1c was 7.7%. she was also placed on ciprofloxacin twice daily for 7 days and a urine culture was recommended. past medical history significant for coronary artery disease, diabetes mellitus, nonischemic cardiomyopathy and pulmonary hypertension. She is also status post heart catheterization and coronary angiography, tubal ligation and breast cyst removal in the past. She has never been a smoker. Patient had arterial studies done which showed bilateral ABIs are artificially elevated due to noncompressible and calcified vessels. Triphasic waveform throughout. Right great toe TBI is elevated while the left is normal. 10/23/2016 -- the patient is rather moribund from several issues including chronic back pain and knee pain and swelling of her legs. The large necrotic area on her left lateral anterior calf was bleeding on touch after washing her leg. There was a spot which needed silver nitrate cauterization and this was done appropriately. Electronic Signature(s) Signed: 10/23/2016 10:13:43 AM By: Christin Fudge MD, FACS Entered By: Christin Fudge on 10/23/2016 10:13:43 [name] ([id number]) -------------------------------------------------------------------------------- Physical Exam Details Patient Name: [name], Jillian B. Date of Service: 10/23/2016 8:45 AM Medical Record Number: [id number] Patient Account Number: [id number] Date of Birth/Sex: 1945-10-05 (71 y.o. Female) Treating RN: Ahmed Prima Primary Care Provider: Lelon Huh Other Clinician: Referring Provider: Lelon Huh Treating  Provider/Extender: Frann Rider in Treatment: 3 Constitutional . Pulse regular. Respirations normal and unlabored. Afebrile. . Eyes Nonicteric. Reactive to light. Ears, Nose, Mouth, and Throat Lips, teeth, and gums WNL.Marland Kitchen Moist mucosa without lesions. Neck supple and nontender. No palpable supraclavicular or cervical adenopathy. Normal sized without goiter. Respiratory WNL. No retractions.. Cardiovascular Pedal Pulses WNL. No clubbing,  cyanosis or edema. Lymphatic No adneopathy. No adenopathy. No adenopathy. Musculoskeletal Adexa without tenderness or enlargement.. Digits and nails w/o clubbing, cyanosis, infection, petechiae, ischemia, or inflammatory conditions.. Integumentary (Hair, Skin) No suspicious lesions. No crepitus or fluctuance. No peri-wound warmth or erythema. No masses.Marland Kitchen Psychiatric Judgement and insight Intact.. No evidence of depression, anxiety, or agitation.. Notes bilateral lower extremity lymphedema right orbit better and has a few areas which are oozing fluid. No sharp debridement was required. The left lower extremity had significant bleeding from a spot when her leg was washed by the treating nurse. I had to use silver nitrate to control this. Electronic Signature(s) Signed: 10/23/2016 10:14:28 AM By: Christin Fudge MD, FACS Entered By: Christin Fudge on 10/23/2016 10:14:26 [name] ([id number]) -------------------------------------------------------------------------------- Physician Orders Details Patient Name: [name], Jillian B. Date of Service: 10/23/2016 8:45 AM Medical Record Number: [id number] Patient Account Number: [id number] Date of Birth/Sex: 11/05/1945 (71 y.o. Female) Treating RN: Ahmed Prima Primary Care Provider: Lelon Huh Other Clinician: Referring Provider: Lelon Huh Treating Provider/Extender: Frann Rider in Treatment: 3 Verbal / Phone Orders: Yes Clinician: Pinkerton, Debi Read Back and Verified:  Yes Diagnosis Coding Wound Cleansing Wound #1 Left,Lateral Lower Leg o Clean wound with Normal Saline. o Cleanse wound with mild soap and water o May Shower, gently pat wound dry prior to applying new dressing. Wound #7 Right,Medial Lower Leg o Clean wound with Normal Saline. o Cleanse wound with mild soap and water o May Shower, gently pat wound dry prior to applying new dressing. Wound #2 Left,Anterior Lower Leg o Clean wound with Normal Saline. o Cleanse wound with mild soap and water o May Shower, gently pat wound dry prior to applying new dressing. Wound #3 Left,Medial Lower Leg o Clean wound with Normal Saline. o Cleanse wound with mild soap and water o May Shower, gently pat wound dry prior to applying new dressing. Wound #6 Right,Lateral Lower Leg o Clean wound with Normal Saline. o Cleanse wound with mild soap and water o May Shower, gently pat wound dry prior to applying new dressing. Anesthetic Wound #1 Left,Lateral Lower Leg o Topical Lidocaine 4% cream applied to wound bed prior to debridement - for clinic use Wound #2 Left,Anterior Lower Leg o Topical Lidocaine 4% cream applied to wound bed prior to debridement - for clinic use Wound #7 Right,Medial Lower Leg o Topical Lidocaine 4% cream applied to wound bed prior to debridement - for clinic use Wound #3 Left,Medial Lower Leg o Topical Lidocaine 4% cream applied to wound bed prior to debridement - for clinic use Wound #6 Right,Lateral Lower Leg o Topical Lidocaine 4% cream applied to wound bed prior to debridement - for clinic use Skin Barriers/Peri-Wound Care Wound #1 Left,Lateral Lower Leg [name], [name] ([id number]) o Barrier cream Wound #2 Left,Anterior Lower Leg o Barrier cream Wound #3 Left,Medial Lower Leg o Barrier cream Primary Wound Dressing Wound #1 Left,Lateral Lower Leg o Santyl Ointment Wound #2 Left,Anterior Lower Leg o Silvercel  Non-Adherent Wound #3 Left,Medial Lower Leg o Santyl Ointment Wound #6 Right,Lateral Lower Leg o Silvercel Non-Adherent Wound #7 Right,Medial Lower Leg o Silvercel Non-Adherent Secondary Dressing Wound #1 Left,Lateral Lower Leg o ABD pad o Dry Gauze o XtraSorb Wound #2 Left,Anterior Lower Leg o ABD pad o Dry Gauze o XtraSorb Wound #3 Left,Medial Lower Leg o ABD pad o Dry Gauze o XtraSorb Wound #6 Right,Lateral Lower Leg o ABD pad o Dry Gauze o XtraSorb Wound #7 Right,Medial Lower Leg o ABD pad o Dry  Gauze o XtraSorb Dressing Change Frequency Wound #1 Left,Lateral Lower Leg o Change Dressing Monday, Wednesday, Friday - HHRN to change Wednesday and Fridays and pt to come to [place] on Mondays. Wound #2 Left,Anterior Lower Leg SOMAYA, GRASSI ([id number]) o Change Dressing Monday, Wednesday, Friday - HHRN to change Wednesday and Fridays and pt to come to [place] on Mondays. Wound #3 Left,Medial Lower Leg o Change Dressing Monday, Wednesday, Friday - HHRN to change Wednesday and Fridays and pt to come to [place] on Mondays. Wound #6 Right,Lateral Lower Leg o Change Dressing Monday, Wednesday, Friday - HHRN to change Wednesday and Fridays and pt to come to [place] on Mondays. Wound #7 Right,Medial Lower Leg o Change Dressing Monday, Wednesday, Friday - HHRN to change Wednesday and Fridays and pt to come to [place] on Mondays. Follow-up Appointments Wound #1 Left,Lateral Lower Leg o Return Appointment in 1 week. Wound #2 Left,Anterior Lower Leg o Return Appointment in 1 week. Wound #3 Left,Medial Lower Leg o Return Appointment in 1 week. Wound #6 Right,Lateral Lower Leg o Return Appointment in 1 week. Wound #7 Right,Medial Lower Leg o Return Appointment in 1 week. Edema Control Wound #1 Left,Lateral Lower Leg o Kerlix and Coban - Bilateral - from 3cm from toes  to 3cm from knees o Elevate legs to the level of the heart and pump ankles as often as possible Wound #2 Left,Anterior Lower Leg o Kerlix and Coban - Bilateral - from 3cm from toes to 3cm from knees o Elevate legs to the level of the heart and pump ankles as often as possible Wound #3 Left,Medial Lower Leg o Kerlix and Coban - Bilateral - from 3cm from toes to 3cm from knees o Elevate legs to the level of the heart and pump ankles as often as possible Wound #6 Right,Lateral Lower Leg o Kerlix and Coban - Bilateral - from 3cm from toes to 3cm from knees o Elevate legs to the level of the heart and pump ankles as often as possible Wound #7 Right,Medial Lower Leg o Kerlix and Coban - Bilateral - from 3cm from toes to 3cm from knees o Elevate legs to the level of the heart and pump ankles as often as possible Additional Orders / Instructions Wound #1 Left,Lateral Lower Leg o Increase protein intake. [name], [name] ([id number]) Wound #2 Left,Anterior Lower Leg o Increase protein intake. Wound #3 Left,Medial Lower Leg o Increase protein intake. Wound #6 Right,Lateral Lower Leg o Increase protein intake. Wound #7 Right,Medial Lower Leg o Increase protein intake. Home Health Wound #1 [place] Nurse may visit PRN to address patientos wound care needs. o FACE TO FACE ENCOUNTER: MEDICARE and MEDICAID PATIENTS: I certify that this patient is under my care and that I had a face-to-face encounter that meets the physician face-to-face encounter requirements with this patient on this date. The encounter with the patient was in whole or in part for the following MEDICAL CONDITION: (primary reason for [place]) MEDICAL NECESSITY: I certify, that based on my findings, NURSING services are a medically necessary home health service. HOME BOUND STATUS: I certify that my clinical findings support that this  patient is homebound (i.e., Due to illness or injury, pt requires aid of supportive devices such as crutches, cane, wheelchairs, walkers, the use of special transportation or the assistance of another person to leave their place of residence. There is a normal  inability to leave the home and doing so requires considerable and taxing effort. Other absences are for medical reasons / religious services and are infrequent or of short duration when for other reasons). o If current dressing causes regression in wound condition, may D/C ordered dressing product/s and apply Normal Saline Moist Dressing daily until next [place] / Other MD appointment. [place] of regression in wound condition at [phone]. o Please direct any NON-WOUND related issues/requests for orders to patient's Primary Care Physician Wound #2 [place] Nurse may visit PRN to address patientos wound care needs. o FACE TO FACE ENCOUNTER: MEDICARE and MEDICAID PATIENTS: I certify that this patient is under my care and that I had a face-to-face encounter that meets the physician face-to-face encounter requirements with this patient on this date. The encounter with the patient was in whole or in part for the following MEDICAL CONDITION: (primary reason for [place]) MEDICAL NECESSITY: I certify, that based on my findings, NURSING services are a medically necessary home health service. HOME BOUND STATUS: I certify that my clinical findings support that this patient is homebound (i.e., Due to illness or injury, pt requires aid of supportive devices such as crutches, cane, wheelchairs, walkers, the use of special transportation or the assistance of another person to leave their place of residence. There is a normal inability to leave the home and doing so requires considerable and taxing effort. Other absences are for medical reasons  / religious services and are infrequent or of short duration when for other reasons). o If current dressing causes regression in wound condition, may D/C ordered dressing product/s and apply Normal Saline Moist Dressing daily until next [place] / Other MD appointment. [place] of regression in wound condition at [phone]. o Please direct any NON-WOUND related issues/requests for orders to patient's Primary Care Physician Wound #3 Left,Medial Lower Leg o [place] Nurse may visit PRN to address patientos wound care needs. o FACE TO FACE ENCOUNTER: MEDICARE and MEDICAID PATIENTS: I certify that this patient is under my care and that I had a face-to-face encounter that meets the physician face-to-face encounter requirements with this patient on this date. The encounter with the patient was in whole or in part for the following MEDICAL CONDITION: (primary reason for [place]) MEDICAL NECESSITY: I certify, that based on my findings, NURSING services are a medically necessary home health service. HOME BOUND STATUS: I certify that my PHILISHA, WEINEL ([id number]) clinical findings support that this patient is homebound (i.e., Due to illness or injury, pt requires aid of supportive devices such as crutches, cane, wheelchairs, walkers, the use of special transportation or the assistance of another person to leave their place of residence. There is a normal inability to leave the home and doing so requires considerable and taxing effort. Other absences are for medical reasons / religious services and are infrequent or of short duration when for other reasons). o If current dressing causes regression in wound condition, may D/C ordered dressing product/s and apply Normal Saline Moist Dressing daily until next [place] / Other MD appointment. [place] of regression in wound condition at  [phone]. o Please direct any NON-WOUND related issues/requests for orders to patient's Primary Care Physician Wound #6 [place] Visits o Home Health Nurse may visit PRN to address patientos wound care  needs. o FACE TO FACE ENCOUNTER: MEDICARE and MEDICAID PATIENTS: I certify that this patient is under my care and that I had a face-to-face encounter that meets the physician face-to-face encounter requirements with this patient on this date. The encounter with the patient was in whole or in part for the following MEDICAL CONDITION: (primary reason for [place]) MEDICAL NECESSITY: I certify, that based on my findings, NURSING services are a medically necessary home health service. HOME BOUND STATUS: I certify that my clinical findings support that this patient is homebound (i.e., Due to illness or injury, pt requires aid of supportive devices such as crutches, cane, wheelchairs, walkers, the use of special transportation or the assistance of another person to leave their place of residence. There is a normal inability to leave the home and doing so requires considerable and taxing effort. Other absences are for medical reasons / religious services and are infrequent or of short duration when for other reasons). o If current dressing causes regression in wound condition, may D/C ordered dressing product/s and apply Normal Saline Moist Dressing daily until next [place] / Other MD appointment. [place] of regression in wound condition at [phone]. o Please direct any NON-WOUND related issues/requests for orders to patient's Primary Care Physician Wound #7 Right,Medial Lower Leg o [place] Visits o Home Health Nurse may visit PRN to address patientos wound care needs. o FACE TO FACE ENCOUNTER: MEDICARE and MEDICAID PATIENTS: I certify that this patient is under my care and that I had a  face-to-face encounter that meets the physician face-to-face encounter requirements with this patient on this date. The encounter with the patient was in whole or in part for the following MEDICAL CONDITION: (primary reason for [place]) MEDICAL NECESSITY: I certify, that based on my findings, NURSING services are a medically necessary home health service. HOME BOUND STATUS: I certify that my clinical findings support that this patient is homebound (i.e., Due to illness or injury, pt requires aid of supportive devices such as crutches, cane, wheelchairs, walkers, the use of special transportation or the assistance of another person to leave their place of residence. There is a normal inability to leave the home and doing so requires considerable and taxing effort. Other absences are for medical reasons / religious services and are infrequent or of short duration when for other reasons). o If current dressing causes regression in wound condition, may D/C ordered dressing product/s and apply Normal Saline Moist Dressing daily until next [place] / Other MD appointment. [place] of regression in wound condition at [phone]. o Please direct any NON-WOUND related issues/requests for orders to patient's Primary Care Physician Medications-please add to medication list. Wound #1 Left,Lateral Lower Leg o P.O. Antibiotics - start Doxycycline o Santyl Enzymatic Ointment o Other: - Vitamin C, Zinc, Multivitamin Wound #2 Left,Anterior Lower Leg o P.O. Antibiotics - start Doxycycline o Santyl Enzymatic Ointment o Other: - Vitamin C, Zinc, Multivitamin Wound #3 Left,Medial Lower Leg [name], Jillian B. ([id number]) o P.O. Antibiotics - start Doxycycline o Santyl Enzymatic Ointment o Other: - Vitamin C, Zinc, Multivitamin Wound #6 Right,Lateral Lower Leg o P.O. Antibiotics - start Doxycycline o Other: - Vitamin C, Zinc, Multivitamin Wound  #7 Right,Medial Lower Leg o P.O. Antibiotics - start Doxycycline o Other: - Vitamin C, Zinc, Multivitamin Electronic Signature(s) Signed: 10/23/2016 4:00:40 PM By: Christin Fudge MD, FACS Signed: 10/23/2016 4:51:17 PM By: Alric Quan Previous Signature: 10/23/2016 12:49:35 PM Version By:  Christin Fudge MD, FACS Entered By: Alric Quan on 10/23/2016 14:25:17 [name] ([id number]) -------------------------------------------------------------------------------- Problem List Details Patient Name: EREKA, BRAU B. Date of Service: 10/23/2016 8:45 AM Medical Record Number: [id number] Patient Account Number: [id number] Date of Birth/Sex: 09-01-1945 (72 y.o. Female) Treating RN: Ahmed Prima Primary Care Provider: Lelon Huh Other Clinician: Referring Provider: Lelon Huh Treating Provider/Extender: Frann Rider in Treatment: 3 Active Problems ICD-10 Encounter Code Description Active Date Diagnosis E11.622 Type 2 diabetes mellitus with other skin ulcer 10/02/2016 Yes I89.0 Lymphedema, not elsewhere classified 10/02/2016 Yes L97.212 Non-pressure chronic ulcer of right calf with fat layer exposed 10/02/2016 Yes L97.222 Non-pressure chronic ulcer of left calf with fat layer exposed 10/02/2016 Yes Inactive Problems Resolved Problems Electronic Signature(s) Signed: 10/23/2016 10:12:40 AM By: Christin Fudge MD, FACS Previous Signature: 10/23/2016 10:12:05 AM Version By: Christin Fudge MD, FACS Entered By: Christin Fudge on 10/23/2016 10:12:39 [name] ([id number]) -------------------------------------------------------------------------------- Progress Note Details Patient Name: [name], BOEHME B. Date of Service: 10/23/2016 8:45 AM Medical Record Number: [id number] Patient Account Number: [id number] Date of Birth/Sex: 12/20/1945 (71 y.o. Female) Treating RN: Ahmed Prima Primary Care Provider: Lelon Huh Other Clinician: Referring Provider: Lelon Huh Treating Provider/Extender: Frann Rider in Treatment: 3 Subjective Chief Complaint Information obtained from Patient Patient seen for complaints of Non-Healing Wounds to both lower extremities which she's had for about 2 months with swelling. History of Present Illness (HPI) The following HPI elements were documented for the patient's wound: Location: both lower extremity swelling with ulceration Quality: Patient reports experiencing a sharp pain to affected area(s). Severity: Patient states wound are getting worse. Duration: Patient has had the wound for > 2 months prior to seeking treatment at the wound center Timing: Pain in wound is constant (hurts all the time) and is worse when she elevates her legs Context: The wound would happen gradually Modifying Factors: Other treatment(s) tried include:she has been on ciprofloxacin and has been applying Silvadene ointment Associated Signs and Symptoms: Patient reports having increase swelling. 71 year old patient who sees her PCP Dr. Lelon Huh was recently evaluated 10 days ago for diabetes mellitus, hypertension, CHF and hyperlipidemia. she also was noted to have ulcerations develop in her legs and she has been applying Silvadene dressings locally. In the past she has refused wound care referrals.her cardiologist Dr. Saunders Revel saw her and put her on 40 mg of furosemide daily. last hemoglobin A1c was 7.7%. she was also placed on ciprofloxacin twice daily for 7 days and a urine culture was recommended. past medical history significant for coronary artery disease, diabetes mellitus, nonischemic cardiomyopathy and pulmonary hypertension. She is also status post heart catheterization and coronary angiography, tubal ligation and breast cyst removal in the past. She has never been a smoker. Patient had arterial studies done which showed bilateral ABIs are artificially elevated due to noncompressible and calcified vessels. Triphasic  waveform throughout. Right great toe TBI is elevated while the left is normal. 10/23/2016 -- the patient is rather moribund from several issues including chronic back pain and knee pain and swelling of her legs. The large necrotic area on her left lateral anterior calf was bleeding on touch after washing her leg. There was a spot which needed silver nitrate cauterization and this was done appropriately. Objective Constitutional Pulse regular. Respirations normal and unlabored. Afebrile. Vitals Time Taken: 9:23 AM, Height: 64 in, Weight: 206 lbs, BMI: 35.4, Temperature: 97.6 F, Pulse: 64 bpm, Respiratory Rate: 16 breaths/min, Blood Pressure: 146/61 mmHg. [name], [name] ([id number]) Eyes Nonicteric.  Reactive to light. Ears, Nose, Mouth, and Throat Lips, teeth, and gums WNL.Marland Kitchen Moist mucosa without lesions. Neck supple and nontender. No palpable supraclavicular or cervical adenopathy. Normal sized without goiter. Respiratory WNL. No retractions.. Cardiovascular Pedal Pulses WNL. No clubbing, cyanosis or edema. Lymphatic No adneopathy. No adenopathy. No adenopathy. Musculoskeletal Adexa without tenderness or enlargement.. Digits and nails w/o clubbing, cyanosis, infection, petechiae, ischemia, or inflammatory conditions.Marland Kitchen Psychiatric Judgement and insight Intact.. No evidence of depression, anxiety, or agitation.. General Notes: bilateral lower extremity lymphedema right orbit better and has a few areas which are oozing fluid. No sharp debridement was required. The left lower extremity had significant bleeding from a spot when her leg was washed by the treating nurse. I had to use silver nitrate to control this. Integumentary (Hair, Skin) No suspicious lesions. No crepitus or fluctuance. No peri-wound warmth or erythema. No masses.. Wound #1 status is Open. Original cause of wound was Gradually Appeared. The wound is located on the Left,Lateral Lower Leg. The wound measures 6.5cm length  x 4.8cm width x 0.2cm depth; 24.504cm^2 area and 4.901cm^3 volume. There is no tunneling or undermining noted. There is a large amount of serosanguineous drainage noted. The wound margin is distinct with the outline attached to the wound base. There is no granulation within the wound bed. There is a large (67-100%) amount of necrotic tissue within the wound bed including Eschar and Adherent Slough. The periwound skin appearance exhibited: Maceration, Erythema. The surrounding wound skin color is noted with erythema which is circumferential. Periwound temperature was noted as No Abnormality. Wound #2 status is Open. Original cause of wound was Gradually Appeared. The wound is located on the Left,Anterior Lower Leg. The wound measures 8.5cm length x 3.5cm width x 0.1cm depth; 23.366cm^2 area and 2.337cm^3 volume. There is no tunneling or undermining noted. There is a large amount of serous drainage noted. The wound margin is distinct with the outline attached to the wound base. There is no granulation within the wound bed. There is a large (67-100%) amount of necrotic tissue within the wound bed including Eschar and Adherent Slough. The periwound skin appearance exhibited: Maceration, Erythema. The surrounding wound skin color is noted with erythema which is circumferential. Periwound temperature was noted as No Abnormality. The periwound has tenderness on palpation. Wound #3 status is Open. Original cause of wound was Gradually Appeared. The wound is located on the Left,Medial Lower Leg. The wound measures 3.1cm length x 2.1cm width x 0.1cm depth; 5.113cm^2 area and 0.511cm^3 volume. There is no tunneling or undermining noted. There is a large amount of serous drainage noted. The wound margin is distinct with the outline attached to the wound base. There is no granulation within the wound bed. There is a large (67-100%) amount of necrotic tissue within the wound bed including Eschar and Adherent  Slough. The periwound skin appearance exhibited: Maceration, Erythema. The surrounding wound skin color is noted with erythema which is circumferential. Periwound temperature was noted as No Abnormality. The periwound has tenderness on palpation. Wound #6 status is Open. Original cause of wound was Gradually Appeared. The wound is located on the Paradis, Kouts B. ([id number]) Lower Leg. The wound measures 0.1cm length x 0.1cm width x 0.1cm depth; 0.008cm^2 area and 0.001cm^3 volume. There is no tunneling or undermining noted. There is a small amount of serosanguineous drainage noted. The wound margin is distinct with the outline attached to the wound base. There is large (67-100%) red granulation within the wound bed. There is no  necrotic tissue within the wound bed. The periwound skin appearance exhibited: Maceration, Erythema. The surrounding wound skin color is noted with erythema which is circumferential. Periwound temperature was noted as No Abnormality. The periwound has tenderness on palpation. Wound #7 status is Open. Original cause of wound was Gradually Appeared. The wound is located on the Right,Medial Lower Leg. The wound measures 0.2cm length x 0.2cm width x 0.1cm depth; 0.031cm^2 area and 0.003cm^3 volume. There is no tunneling or undermining noted. There is a large amount of serous drainage noted. The wound margin is distinct with the outline attached to the wound base. There is large (67-100%) red granulation within the wound bed. There is no necrotic tissue within the wound bed. The periwound skin appearance exhibited: Maceration. The periwound skin appearance did not exhibit: Callus, Crepitus, Excoriation, Induration, Rash, Scarring, Dry/Scaly, Atrophie Blanche, Cyanosis, Ecchymosis, Hemosiderin Staining, Mottled, Pallor, Rubor, Erythema. Periwound temperature was noted as No Abnormality. The periwound has tenderness on palpation. 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 Cleanse wound with mild soap and water May Shower, gently pat wound dry prior to applying new dressing. Wound #7 Right,Medial Lower Leg: Clean wound with Normal Saline. Cleanse wound with mild soap and water May Shower, gently pat wound dry prior to applying new dressing. Wound #2 Left,Anterior Lower Leg: Clean wound with Normal Saline. Cleanse wound with mild soap and water May Shower, gently pat wound dry prior to applying new dressing. Wound #3 Left,Medial Lower Leg: Clean wound with Normal Saline. Cleanse wound with mild soap and water May Shower, gently pat wound dry prior to applying new dressing. Wound #6 Right,Lateral Lower Leg: Clean wound with Normal Saline. Cleanse wound with mild soap and water May Shower, gently pat wound dry prior to applying new dressing. Anesthetic: [name], [name] ([id number]) Wound #1 Left,Lateral Lower Leg: Topical Lidocaine 4% cream applied to wound bed prior to debridement - for clinic use Wound #2 Left,Anterior Lower Leg: Topical Lidocaine 4% cream applied to wound bed prior to debridement - for clinic use Wound #7 Right,Medial Lower Leg: Topical Lidocaine 4% cream applied to wound bed prior to debridement - for clinic use Wound #3 Left,Medial Lower Leg: Topical Lidocaine 4% cream applied to wound bed prior to debridement - for clinic use Wound #6 Right,Lateral Lower Leg: Topical Lidocaine 4% cream applied to wound bed prior to debridement - for clinic use Skin Barriers/Peri-Wound Care: Wound #1 Left,Lateral Lower Leg: Barrier cream Wound #2 Left,Anterior Lower Leg: Barrier cream Wound #3  Left,Medial Lower Leg: Barrier cream Primary Wound Dressing: Wound #1 Left,Lateral Lower Leg: Santyl Ointment Wound #2 Left,Anterior Lower Leg: Silvercel Non-Adherent Wound #3 Left,Medial Lower Leg: Santyl Ointment Wound #6 Right,Lateral Lower Leg: Silvercel Non-Adherent Wound #7 Right,Medial Lower Leg: Silvercel Non-Adherent Secondary Dressing: Wound #1 Left,Lateral Lower Leg: ABD pad Dry Gauze XtraSorb Wound #2 Left,Anterior Lower Leg: ABD pad Dry Gauze XtraSorb Wound #3 Left,Medial Lower Leg: ABD pad Dry Gauze XtraSorb Wound #6 Right,Lateral Lower Leg: ABD pad Dry Gauze XtraSorb Wound #7 Right,Medial Lower Leg: ABD pad Dry Gauze XtraSorb Dressing Change Frequency: Wound #1 Left,Lateral Lower Leg: Change Dressing Monday, Wednesday, Friday - HHRN to change Wednesday and Fridays and [facility]. Wound #2 Left,Anterior Lower Leg: Change Dressing Monday, Wednesday, Friday - HHRN to change Wednesday and Fridays and pt to come to [place] on Mondays. Wound #3 Left,Medial Lower Leg: Change Dressing Monday, Wednesday, Friday - HHRN to change Wednesday and Fridays and pt to come to [place] on Mondays. Wound #6 Right,Lateral Lower Leg: [name], [name] ([id number]) Change Dressing Monday, Wednesday, Friday - HHRN to change Wednesday and Fridays and pt to come to [place] on Mondays. Wound #7 Right,Medial Lower Leg: Change Dressing Monday, Wednesday, Friday - HHRN to change Wednesday and Fridays and pt to come to [place] on Mondays. Follow-up Appointments: Wound #1 Left,Lateral Lower Leg: Return Appointment in 1 week. Wound #2 Left,Anterior Lower Leg: Return Appointment in 1 week. Wound #3 Left,Medial Lower Leg: Return Appointment in 1 week. Wound #6 Right,Lateral Lower Leg: Return Appointment in 1 week. Wound #7 Right,Medial Lower Leg: Return Appointment in 1 week. Edema Control: Wound #1  Left,Lateral Lower Leg: Kerlix and Coban - Bilateral - from 3cm from toes to 3cm from knees Elevate legs to the level of the heart and pump ankles as often as possible Wound #2 Left,Anterior Lower Leg: Kerlix and Coban - Bilateral - from 3cm from toes to 3cm from knees Elevate legs to the level of the heart and pump ankles as often as possible Wound #3 Left,Medial Lower Leg: Kerlix and Coban - Bilateral - from 3cm from toes to 3cm from knees Elevate legs to the level of the heart and pump ankles as often as possible Wound #6 Right,Lateral Lower Leg: Kerlix and Coban - Bilateral - from 3cm from toes to 3cm from knees Elevate legs to the level of the heart and pump ankles as often as possible Wound #7 Right,Medial Lower Leg: Kerlix and Coban - Bilateral - from 3cm from toes to 3cm from knees Elevate legs to the level of the heart and pump ankles as often as possible Additional Orders / Instructions: Wound #1 Left,Lateral Lower Leg: Increase protein intake. Wound #2 Left,Anterior Lower Leg: Increase protein intake. Wound #3 Left,Medial Lower Leg: Increase protein intake. Wound #6 Right,Lateral Lower Leg: Increase protein intake. Wound #7 Right,Medial Lower Leg: Increase protein intake. Home Health: Wound #1 Left,Lateral Lower Leg: [place] Nurse may visit PRN to address patient s wound care needs. FACE TO FACE ENCOUNTER: MEDICARE and MEDICAID PATIENTS: I certify that this patient is under my care and that I had a face-to-face encounter that meets the physician face-to-face encounter requirements with this patient on this date. The encounter with the patient was in whole or in part for the following MEDICAL CONDITION: (primary reason for [place]) MEDICAL NECESSITY: I certify, that based on my findings, NURSING services are a medically necessary home health service. HOME BOUND STATUS: I certify that my clinical findings support that this patient is  homebound (i.e., Due to illness or injury, pt requires aid of supportive devices such as crutches, cane, wheelchairs, walkers, the use of special transportation or the assistance of another person to leave their place of residence. There is a normal inability to leave the home and doing so requires considerable and taxing effort. Other absences are for medical reasons / religious services and are infrequent or of short duration when for other reasons). If current dressing causes regression in wound condition, may D/C ordered dressing product/s and apply Normal Saline Moist Dressing daily until next [place] / Other  MD appointment. [place] of regression in wound condition at [phone]. Please direct any NON-WOUND related issues/requests for orders to patient's Primary Care Physician BILL, MCVEY ([id number]) Wound #2 Left,Anterior Lower Leg: [place] Nurse may visit PRN to address patient s wound care needs. FACE TO FACE ENCOUNTER: MEDICARE and MEDICAID PATIENTS: I certify that this patient is under my care and that I had a face-to-face encounter that meets the physician face-to-face encounter requirements with this patient on this date. The encounter with the patient was in whole or in part for the following MEDICAL CONDITION: (primary reason for [place]) MEDICAL NECESSITY: I certify, that based on my findings, NURSING services are a medically necessary home health service. HOME BOUND STATUS: I certify that my clinical findings support that this patient is homebound (i.e., Due to illness or injury, pt requires aid of supportive devices such as crutches, cane, wheelchairs, walkers, the use of special transportation or the assistance of another person to leave their place of residence. There is a normal inability to leave the home and doing so requires considerable and taxing effort. Other absences are for medical reasons /  religious services and are infrequent or of short duration when for other reasons). If current dressing causes regression in wound condition, may D/C ordered dressing product/s and apply Normal Saline Moist Dressing daily until next [place] / Other MD appointment. [place] of regression in wound condition at [phone]. Please direct any NON-WOUND related issues/requests for orders to patient's Primary Care Physician Wound #3 Left,Medial Lower Leg: [place] Nurse may visit PRN to address patient s wound care needs. FACE TO FACE ENCOUNTER: MEDICARE and MEDICAID PATIENTS: I certify that this patient is under my care and that I had a face-to-face encounter that meets the physician face-to-face encounter requirements with this patient on this date. The encounter with the patient was in whole or in part for the following MEDICAL CONDITION: (primary reason for [place]) MEDICAL NECESSITY: I certify, that based on my findings, NURSING services are a medically necessary home health service. HOME BOUND STATUS: I certify that my clinical findings support that this patient is homebound (i.e., Due to illness or injury, pt requires aid of supportive devices such as crutches, cane, wheelchairs, walkers, the use of special transportation or the assistance of another person to leave their place of residence. There is a normal inability to leave the home and doing so requires considerable and taxing effort. Other absences are for medical reasons / religious services and are infrequent or of short duration when for other reasons). If current dressing causes regression in wound condition, may D/C ordered dressing product/s and apply Normal Saline Moist Dressing daily until next [place] / Other MD appointment. [place] of regression in wound condition at [phone]. Please direct any NON-WOUND related  issues/requests for orders to patient's Primary Care Physician Wound #6 Right,Lateral Lower Leg: [place] Nurse may visit PRN to address patient s wound care needs. FACE TO FACE ENCOUNTER: MEDICARE and MEDICAID PATIENTS: I certify that this patient is under my care and that I had a face-to-face encounter that meets the physician face-to-face encounter requirements with this patient on this date. The encounter with the patient was in whole or in part for the following MEDICAL CONDITION: (primary reason for [place]) MEDICAL NECESSITY: I certify, that based on my findings, NURSING services are  a medically necessary home health service. HOME BOUND STATUS: I certify that my clinical findings support that this patient is homebound (i.e., Due to illness or injury, pt requires aid of supportive devices such as crutches, cane, wheelchairs, walkers, the use of special transportation or the assistance of another person to leave their place of residence. There is a normal inability to leave the home and doing so requires considerable and taxing effort. Other absences are for medical reasons / religious services and are infrequent or of short duration when for other reasons). If current dressing causes regression in wound condition, may D/C ordered dressing product/s and apply Normal Saline Moist Dressing daily until next [place] / Other MD appointment. [place] of regression in wound condition at [phone]. Please direct any NON-WOUND related issues/requests for orders to patient's Primary Care Physician Wound #7 Right,Medial Lower Leg: [place] Nurse may visit PRN to address patient s wound care needs. FACE TO FACE ENCOUNTER: MEDICARE and MEDICAID PATIENTS: I certify that this patient is under my care and that I had a face-to-face encounter that meets the physician face-to-face encounter requirements  with this patient on this date. The encounter with the patient was in whole or in part for the following MEDICAL CONDITION: (primary reason for [place]) MEDICAL NECESSITY: I certify, that based on my findings, NURSING services are a medically necessary home health service. HOME BOUND STATUS: I certify that my clinical findings support that this patient is homebound (i.e., Due to illness or injury, pt requires aid of supportive devices such as crutches, cane, wheelchairs, walkers, the use of special transportation or the assistance of another person to leave their place of residence. There is a normal inability to leave the home and doing so requires considerable and taxing effort. Other absences are for medical reasons / religious services and are infrequent or of short duration when for other reasons). CECELIA, GRACIANO ([id number]) If current dressing causes regression in wound condition, may D/C ordered dressing product/s and apply Normal Saline Moist Dressing daily until next [place] / Other MD appointment. [place] of regression in wound condition at [phone]. Please direct any NON-WOUND related issues/requests for orders to patient's Primary Care Physician Medications-please add to medication list.: Wound #1 Left,Lateral Lower Leg: P.O. Antibiotics - start Doxycycline Santyl Enzymatic Ointment Other: - Vitamin C, Zinc, Multivitamin Wound #2 Left,Anterior Lower Leg: P.O. Antibiotics - start Doxycycline Santyl Enzymatic Ointment Other: - Vitamin C, Zinc, Multivitamin Wound #3 Left,Medial Lower Leg: P.O. Antibiotics - start Doxycycline Santyl Enzymatic Ointment Other: - Vitamin C, Zinc, Multivitamin Wound #6 Right,Lateral Lower Leg: P.O. Antibiotics - start Doxycycline Other: - Vitamin C, Zinc, Multivitamin Wound #7 Right,Medial Lower Leg: P.O. Antibiotics - start Doxycycline Other: - Vitamin C, Zinc, Multivitamin the patient is rather  moribund from several issues including chronic back pain and knee pain. She also has significant CHF and lymphedema. After review today, I have recommended: 1. Silver alginate to the right lower extremity and Santyl ointment to the left lower extremity to be wrapped with Kerlix and Coban to be changed 3 times a week 2. Elevation and exercise discussed with her in great detail 3. her husband who is the caregiver and is at the bedside, has had several questions which I have onset to his satisfaction. 4. I have also discussed bleeding from the left lower extremity from the base of the necrotic ulcer which may have an open vein. If  this occurs at any time while she is at home, I have discussed compression and she should seek help in the ER immediately 5. Regular visits to the wound center. she and her husband have read all questions answered and will be agreeable to the treatment plan Electronic Signature(s) Signed: 10/23/2016 3:59:09 PM By: Christin Fudge MD, FACS Previous Signature: 10/23/2016 10:16:34 AM Version By: Christin Fudge MD, FACS Entered By: Christin Fudge on 10/23/2016 15:59:09 [name] ([id number]) -------------------------------------------------------------------------------- SuperBill Details Patient Name: [name] Date of Service: 10/23/2016 Medical Record Number: [id number] Patient Account Number: [id number] Date of Birth/Sex: 15-Apr-1945 (70 y.o. 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 Facility Procedures CPT4 Code:  [id number] Description: [phone] - WOUND CARE VISIT-LEV 5 EST PT Modifier: Quantity: 1 Physician Procedures CPT4 Code: [id number] Description: 33744 - WC PHYS LEVEL 3 - EST PT ICD-10 Diagnosis Description E11.622 Type 2 diabetes mellitus with other skin ulcer I89.0 Lymphedema, not elsewhere classified L97.212 Non-pressure chronic ulcer of right calf with fat layer exp L97.222  Non-pressure chronic ulcer of left calf with fat layer expo Modifier: osed sed Quantity: 1 Electronic Signature(s) Signed: 10/23/2016 2:21:15 PM By: Alric Quan Signed: 10/23/2016 4:00:40 PM By: Christin Fudge MD, FACS Previous Signature: 10/23/2016 10:16:49 AM Version By: Christin Fudge MD, FACS Entered By: Alric Quan on 10/23/2016 14:21:15

## 2016-10-24 NOTE — Progress Notes (Signed)
LAKEITHA, BASQUES ([id number]) Visit Report for 10/23/2016 Arrival Information Details Patient Name: [name], [name]. Date of Service: 10/23/2016 8:45 AM Medical Record Number: [id number] Patient Account Number: [id number] Date of Birth/Sex: Jul 31, 1945 (71 y.o. Female) Treating RN: Ahmed Prima Primary Care Karren Newland: Lelon Huh Other Clinician: Referring Amalee Olsen: Lelon Huh Treating Josua Ferrebee/Extender: Frann Rider in Treatment: 3 Visit Information History Since Last Visit All ordered tests and consults were completed: No Patient Arrived: Wheel Chair Added or deleted any medications: No Arrival Time: 09:19 Any new allergies or adverse reactions: No Accompanied By: husband Had a fall or experienced change in No Transfer Assistance: EasyPivot Patient activities of daily living that may affect Lift risk of falls: Patient Identification Verified: Yes Signs or symptoms of abuse/neglect since last visito No Secondary Verification Process Yes Hospitalized since last visit: No Completed: Has Dressing in Place as Prescribed: Yes Patient Requires Transmission-Based No Precautions: Has Compression in Place as Prescribed: Yes Patient Has Alerts: Yes Pain Present Now: Yes Electronic Signature(s) Signed: 10/23/2016 4:51:17 PM By: Alric Quan Entered By: Alric Quan on 10/23/2016 09:23:22 [name] ([id number]) -------------------------------------------------------------------------------- Clinic Level of Care Assessment Details Patient Name: [name]. Date of Service: 10/23/2016 8:45 AM Medical Record Number: [id number] Patient Account Number: [id number] Date of Birth/Sex: 1945/04/21 (71 y.o. Female) Treating RN: Carolyne Fiscal, Debi Primary Care Hoover Grewe: Lelon Huh Other Clinician: Referring Kassia Demarinis: Lelon Huh Treating Vail Vuncannon/Extender: Frann Rider in Treatment: 3 Clinic Level of Care Assessment Items TOOL 4 Quantity Score X - Use  when only an EandM is performed on FOLLOW-UP visit 1 0 ASSESSMENTS - Nursing Assessment / Reassessment X - Reassessment of Co-morbidities (includes updates in patient status) 1 10 X- 1 5 Reassessment of Adherence to Treatment Plan ASSESSMENTS - Wound and Skin Assessment / Reassessment []  - Simple Wound Assessment / Reassessment - one wound 0 X- 5 5 Complex Wound Assessment / Reassessment - multiple wounds []  - 0 Dermatologic / Skin Assessment (not related to wound area) ASSESSMENTS - Focused Assessment []  - Circumferential Edema Measurements - multi extremities 0 []  - 0 Nutritional Assessment / Counseling / Intervention []  - 0 Lower Extremity Assessment (monofilament, tuning fork, pulses) []  - 0 Peripheral Arterial Disease Assessment (using hand held doppler) ASSESSMENTS - Ostomy and/or Continence Assessment and Care []  - Incontinence Assessment and Management 0 []  - 0 Ostomy Care Assessment and Management (repouching, etc.) PROCESS - Coordination of Care []  - Simple Patient / Family Education for ongoing care 0 X- 1 20 Complex (extensive) Patient / Family Education for ongoing care X- 1 10 Staff obtains Programmer, systems, Records, Test Results / Process Orders X- 1 10 Staff telephones HHA, Nursing Homes / Clarify orders / etc []  - 0 Routine Transfer to another Facility (non-emergent condition) []  - 0 Routine Hospital Admission (non-emergent condition) []  - 0 New Admissions / Biomedical engineer / Ordering NPWT, Apligraf, etc. []  - 0 Emergency Hospital Admission (emergent condition) X- 1 10 Simple Discharge Coordination SAREEN, RANDON B. ([id number]) []  - 0 Complex (extensive) Discharge Coordination PROCESS - Special Needs []  - Pediatric / Minor Patient Management 0 []  - 0 Isolation Patient Management []  - 0 Hearing / Language / Visual special needs []  - 0 Assessment of Community assistance (transportation, D/C planning, etc.) []  - 0 Additional assistance / Altered  mentation []  - 0 Support Surface(s) Assessment (bed, cushion, seat, etc.) INTERVENTIONS - Wound Cleansing / Measurement []  - Simple Wound Cleansing - one wound 0 X- 5 5 Complex Wound Cleansing - multiple wounds X-  1 5 Wound Imaging (photographs - any number of wounds) []  - 0 Wound Tracing (instead of photographs) []  - 0 Simple Wound Measurement - one wound X- 5 5 Complex Wound Measurement - multiple wounds INTERVENTIONS - Wound Dressings []  - Small Wound Dressing one or multiple wounds 0 []  - 0 Medium Wound Dressing one or multiple wounds X- 2 20 Large Wound Dressing one or multiple wounds X- 1 5 Application of Medications - topical []  - 0 Application of Medications - injection INTERVENTIONS - Miscellaneous []  - External ear exam 0 []  - 0 Specimen Collection (cultures, biopsies, blood, body fluids, etc.) []  - 0 Specimen(s) / Culture(s) sent or taken to Lab for analysis []  - 0 Patient Transfer (multiple staff / Civil Service fast streamer / Similar devices) []  - 0 Simple Staple / Suture removal (25 or less) []  - 0 Complex Staple / Suture removal (26 or more) []  - 0 Hypo / Hyperglycemic Management (close monitor of Blood Glucose) []  - 0 Ankle / Brachial Index (ABI) - do not check if billed separately X- 1 5 Vital Signs Direnzo, Cerys B. ([id number]) Has the patient been seen at the hospital within the last three years: Yes Total Score: 195 Level Of Care: New/Established - Level 5 Electronic Signature(s) Signed: 10/23/2016 4:51:17 PM By: Alric Quan Entered By: Alric Quan on 10/23/2016 14:26:14 [name] ([id number]) -------------------------------------------------------------------------------- Encounter Discharge Information Details Patient Name: [name] Date of Service: 10/23/2016 8:45 AM Medical Record Number: [id number] Patient Account Number: [id number] Date of Birth/Sex: Nov 09, 1945 (71 y.o. Female) Treating RN: Ahmed Prima Primary Care Avir Deruiter:  Lelon Huh Other Clinician: Referring Aeriel Boulay: Lelon Huh Treating Noorah Giammona/Extender: Frann Rider in Treatment: 3 Encounter Discharge Information Items Discharge Pain Level: 4 Discharge Condition: Stable Ambulatory Status: Wheelchair Discharge Destination: Home Transportation: Private Auto Accompanied By: husband Schedule Follow-up Appointment: Yes Medication Reconciliation completed and No provided to Patient/Care Jaxon Flatt: Provided on Clinical Summary of Care: 10/23/2016 Form Type Recipient Paper Patient CS Electronic Signature(s) Signed: 10/23/2016 2:28:52 PM By: Alric Quan Entered By: Alric Quan on 10/23/2016 14:28:51 [name] ([id number]) -------------------------------------------------------------------------------- Lower Extremity Assessment Details Patient Name: [name] Date of Service: 10/23/2016 8:45 AM Medical Record Number: [id number] Patient Account Number: [id number] Date of Birth/Sex: [birth year] (71 y.o. Female) Treating RN: Carolyne Fiscal, Debi Primary Care Shamonica Schadt: Lelon Huh Other Clinician: Referring Cesar Alf: Lelon Huh Treating Biran Mayberry/Extender: Frann Rider in Treatment: 3 Edema Assessment Assessed: [Left: No] [Right: No] [Left: Edema] [Right: :] Calf Left: Right: Point of Measurement: 28 cm From Medial Instep 48.5 cm 45 cm Ankle Left: Right: Point of Measurement: 9 cm From Medial Instep 25.5 cm 24.4 cm Vascular Assessment Pulses: Dorsalis Pedis Palpable: [Left:No] [Right:Yes] Doppler Audible: [Left:Yes] [Right:Yes] Posterior Tibial Extremity colors, hair growth, and conditions: Extremity Color: [Left:Red] [Right:Red] Temperature of Extremity: [Left:Warm] [Right:Warm] Capillary Refill: [Left:< 3 seconds] [Right:< 3 seconds] Toe Nail Assessment Left: Right: Thick: No No Discolored: No No Deformed: No No Improper Length and Hygiene: Yes Yes Electronic Signature(s) Signed: 10/23/2016  4:51:17 PM By: Alric Quan Entered By: Alric Quan on 10/23/2016 09:43:57 Summey, Jeanice Lim ([id number]) -------------------------------------------------------------------------------- Multi Wound Chart Details Patient Name: [name] Date of Service: 10/23/2016 8:45 AM Medical Record Number: [id number] Patient Account Number: [id number] Date of Birth/Sex: 1945-10-16 (71 y.o. Female) Treating RN: Ahmed Prima Primary Care Rosana Farnell: Lelon Huh Other Clinician: Referring Cordia Miklos: Lelon Huh Treating Accalia Rigdon/Extender: Frann Rider in Treatment: 3 Vital Signs Height(in): 64 Pulse(bpm): 7 Weight(lbs): 206 Blood Pressure(mmHg): 146/61 Body Mass Index(BMI): 35  Temperature(F): 97.6 Respiratory Rate 16 (breaths/min): Photos: [1:No Photos] [2:No Photos] [6:No Photos] Wound Location: [1:Left Lower Leg - Lateral] [2:Left Lower Leg - Anterior] [6:Right Lower Leg - Lateral] Wounding Event: [1:Gradually Appeared] [2:Gradually Appeared] [6:Gradually Appeared] Primary Etiology: [1: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] [2:Lower Extremity] [6:Lower Extremity] Comorbid History: [1:Asthma, Congestive Heart Failure, Coronary Artery Disease, Hypertension, Type II Disease, Hypertension, Type II Disease, Hypertension, Type II Diabetes, Osteoarthritis] [2:Asthma, Congestive Heart Failure, Coronary Artery Diabetes,  Osteoarthritis] [6:Asthma, Congestive Heart Failure, Coronary Artery Diabetes, Osteoarthritis] Date Acquired: [1:08/02/2016] [2:08/02/2016] [6:08/02/2016] Weeks of Treatment: [1:3] [2:3] [6:3] Wound Status: [1:Open] [2:Open] [6:Open] Measurements L x W x D [1:6.5x4.8x0.2] [2:8.5x3.5x0.1] [6:0.1x0.1x0.1] (cm) Area (cm) : [1:24.504] [2:23.366] [6:0.008] Volume (cm) : [1:4.901] [2:2.337] [6:0.001] % Reduction in Area: [1:-41.80%] [2:-197.50%] [6:99.90%] % Reduction in Volume: [1:-183.60%] [2:-197.70%]  [6:99.80%] Classification: [1:Grade 1] [2:Grade 1] [6:Grade 1] Exudate Amount: [1:Large] [2:Large] [6:Small] Exudate Type: [1:Serosanguineous] [2:Serous] [6:Serosanguineous] Exudate Color: [1:red, brown] [2:amber] [6:red, brown] Wound Margin: [1:Distinct, outline attached] [2:Distinct, outline attached] [6:Distinct, outline attached] Granulation Amount: [1:None Present (0%)] [2:None Present (0%)] [6:Large (67-100%)] Granulation Quality: [1:N/A] [2:N/A] [6:Red] Necrotic Amount: [1:Large (67-100%)] [2:Large (67-100%)] [6:None Present (0%)] Necrotic Tissue: [1:Eschar, Adherent Slough] [2:Eschar, Adherent Slough] [6:N/A] Epithelialization: [1:None] [2:None] [6:Large (67-100%)] Periwound Skin Texture: [1:No Abnormalities Noted] [2:No Abnormalities Noted] [6:No Abnormalities Noted] Periwound Skin Moisture: [1:Maceration: Yes] [2:Maceration: Yes] [6:Maceration: Yes] Periwound Skin Color: [1:Erythema: Yes] [2:Erythema: Yes] [6:Erythema: Yes] Erythema Location: [1:Circumferential] [2:Circumferential] [6:Circumferential] Temperature: [1:No Abnormality] [2:No Abnormality] [6:No Abnormality] Tenderness on Palpation: [1:No] [2:Yes] [6:Yes] Wound Preparation: [1:Ulcer Cleansing: Rinsed/Irrigated with Saline, Other: soap and water] [2:Ulcer Cleansing: Rinsed/Irrigated with Saline, Other: soap and water] [6:Ulcer Cleansing: Rinsed/Irrigated with Saline, Other: soap and water] Topical Anesthetic Applied: Topical Anesthetic Applied: Topical Anesthetic Applied: Other: lidocaine 4% Other: lidocaine 4% None Treatment Notes Electronic Signature(s) Signed: 10/23/2016 10:12:45 AM By: Christin Fudge MD, FACS Previous Signature: 10/23/2016 10:12:10 AM Version By: Christin Fudge MD, FACS Entered By: Christin Fudge on 10/23/2016 10:12:45 [name] ([id number]) -------------------------------------------------------------------------------- [place] Details Patient Name: SOLEIA, BADOLATO. Date of Service: 10/23/2016 8:45 AM Medical Record Number: [id number] Patient Account Number: [id number] Date of Birth/Sex: 04/08/1945 (71 y.o. Female) Treating RN: Ahmed Prima Primary Care Waldo Damian: Lelon Huh Other Clinician: Referring Geran Haithcock: Lelon Huh Treating Sinclair Alligood/Extender: Frann Rider in Treatment: 3 Active Inactive ` Abuse / Safety / Falls / Self Care Management Nursing Diagnoses: History of Falls Potential for falls Goals: Patient will not experience any injury related to falls Date Initiated: 10/02/2016 Target Resolution Date: 01/06/2017 Goal Status: Active Interventions: Assess: immobility, friction, shearing, incontinence upon admission and as needed Assess impairment of mobility on admission and as needed per policy Assess personal safety and home safety (as indicated) on admission and as needed Notes: ` Nutrition Nursing Diagnoses: Imbalanced nutrition Impaired glucose control: actual or potential Potential for alteratiion in Nutrition/Potential for imbalanced nutrition Goals: Patient/caregiver agrees to and verbalizes understanding of need to use nutritional supplements and/or vitamins as prescribed Date Initiated: 10/02/2016 Target Resolution Date: 02/10/2017 Goal Status: Active Patient/caregiver will maintain therapeutic glucose control Date Initiated: 10/02/2016 Target Resolution Date: 01/06/2017 Goal Status: Active Interventions: Assess patient nutrition upon admission and as needed per policy Provide education on elevated blood sugars and impact on wound healing Provide education on nutrition Treatment Activities: Education provided on Nutrition : 10/02/2016 AMARRIA, ANDREASEN ([id number]) Notes: ` Orientation to the Wound Care Program Nursing Diagnoses: Knowledge deficit related to the wound healing  center program Goals: Patient/caregiver will verbalize understanding of the [place] Date Initiated:  10/02/2016 Target Resolution Date: 11/11/2016 Goal Status: Active Interventions: Provide education on orientation to the wound center Notes: ` Pain, Acute or Chronic Nursing Diagnoses: Pain, acute or chronic: actual or potential Potential alteration in comfort, pain Goals: Patient/caregiver will verbalize adequate pain control between visits Date Initiated: 10/02/2016 Target Resolution Date: 02/10/2017 Goal Status: Active Interventions: Complete pain assessment as per visit requirements Notes: ` Wound/Skin Impairment Nursing Diagnoses: Impaired tissue integrity Knowledge deficit related to ulceration/compromised skin integrity Goals: Ulcer/skin breakdown will have a volume reduction of 80% by week 12 Date Initiated: 10/02/2016 Target Resolution Date: 02/03/2017 Goal Status: Active Interventions: Assess patient/caregiver ability to perform ulcer/skin care regimen upon admission and as needed Assess ulceration(s) every visit Notes: RASHAWN, ROLON ([id number]) Electronic Signature(s) Signed: 10/23/2016 4:51:17 PM By: Alric Quan Entered By: Alric Quan on 10/23/2016 09:44:02 [name] ([id number]) -------------------------------------------------------------------------------- Pain Assessment Details Patient Name: [name] Date of Service: 10/23/2016 8:45 AM Medical Record Number: [id number] Patient Account Number: [id number] Date of Birth/Sex: [birth year] (71 y.o. Female) Treating RN: Ahmed Prima Primary Care Britteney Ayotte: Lelon Huh Other Clinician: Referring Abra Lingenfelter: Lelon Huh Treating Aroush Chasse/Extender: Frann Rider in Treatment: 3 Active Problems Location of Pain Severity and Description of Pain Patient Has Paino Yes Site Locations Pain Location: Pain in Ulcers Rate the pain. Current Pain Level: 8 Character of Pain Describe the Pain: Aching, Burning Pain Management and Medication Current Pain Management: Electronic  Signature(s) Signed: 10/23/2016 4:51:17 PM By: Alric Quan Entered By: Alric Quan on 10/23/2016 09:23:44 [name] ([id number]) -------------------------------------------------------------------------------- Patient/Caregiver Education Details Patient Name: [name]. Date of Service: 10/23/2016 8:45 AM Medical Record Number: [id number] Patient Account Number: [id number] Date of Birth/Gender: 02-Mar-1945 (71 y.o. Female) Treating RN: Ahmed Prima Primary Care Physician: Lelon Huh Other Clinician: Referring Physician: Lelon Huh Treating Physician/Extender: Frann Rider in Treatment: 3 Education Assessment Education Provided To: Patient and Caregiver husband Education Topics Provided Wound/Skin Impairment: Handouts: Other: change dressing as ordered Methods: Demonstration, Explain/Verbal Responses: State content correctly Electronic Signature(s) Signed: 10/23/2016 4:51:17 PM By: Alric Quan Entered By: Alric Quan on 10/23/2016 14:29:12 [name] ([id number]) -------------------------------------------------------------------------------- Wound Assessment Details Patient Name: [name] Date of Service: 10/23/2016 8:45 AM Medical Record Number: [id number] Patient Account Number: [id number] Date of Birth/Sex: 1945/01/27 (71 y.o. Female) Treating RN: Carolyne Fiscal, Debi Primary Care Jamarie Joplin: Lelon Huh Other Clinician: Referring Yostin Malacara: Lelon Huh Treating Salwa Bai/Extender: Frann Rider in Treatment: 3 Wound Status Wound Number: 1 Primary Diabetic Wound/Ulcer of the Lower Extremity Etiology: Wound Location: Left Lower Leg - Lateral Wound Open Wounding Event: Gradually Appeared Status: Date Acquired: 08/02/2016 Comorbid Asthma, Congestive Heart Failure, Coronary Weeks Of Treatment: 3 History: Artery Disease, Hypertension, Type II Diabetes, Clustered Wound: No Osteoarthritis Wound  Measurements Length: (cm) 6.5 Width: (cm) 4.8 Depth: (cm) 0.2 Area: (cm) 24.504 Volume: (cm) 4.901 % Reduction in Area: -41.8% % Reduction in Volume: -183.6% Epithelialization: None Tunneling: No Undermining: No Wound Description Classification: Grade 1 Wound Margin: Distinct, outline attached Exudate Amount: Large Exudate Type: Serosanguineous Exudate Color: red, brown Foul Odor After Cleansing: No Slough/Fibrino Yes Wound Bed Granulation Amount: None Present (0%) Necrotic Amount: Large (67-100%) Necrotic Quality: Eschar, Adherent Slough Periwound Skin Texture Texture Color No Abnormalities Noted: No No Abnormalities Noted: No Erythema: Yes Moisture Erythema Location: Circumferential No Abnormalities Noted: No Maceration: Yes Temperature / Pain Temperature: No Abnormality Wound Preparation Ulcer Cleansing: Rinsed/Irrigated with Saline, Other: soap  and water, Topical Anesthetic Applied: Other: lidocaine 4%, Treatment Notes Wound #1 (Left, Lateral Lower Leg) 1. Cleansed with: Clean wound with Normal Saline Cleanse wound with antibacterial soap and water SUMAIYA, ARRUDA B. ([id number]) 2. Anesthetic Topical Lidocaine 4% cream to wound bed prior to debridement 3. Peri-wound Care: Barrier cream 4. Dressing Applied: Santyl Ointment 5. Secondary Dressing Applied ABD Pad Dry Gauze 7. Secured with Tape Notes xtrasorb, kerlix, Event organiser) Signed: 10/23/2016 4:51:17 PM By: Alric Quan Entered By: Alric Quan on 10/23/2016 09:39:57 Chiara, Jeanice Lim ([id number]) -------------------------------------------------------------------------------- Wound Assessment Details Patient Name: CHANCI, OJALA B. Date of Service: 10/23/2016 8:45 AM Medical Record Number: [id number] Patient Account Number: [id number] Date of Birth/Sex: 1945-06-17 (71 y.o. Female) Treating RN: Carolyne Fiscal, Debi Primary Care Selmer Adduci: Lelon Huh Other Clinician: Referring  Travez Stancil: Lelon Huh Treating Eureka Valdes/Extender: Frann Rider in Treatment: 3 Wound Status Wound Number: 2 Primary Diabetic Wound/Ulcer of the Lower Extremity Etiology: Wound Location: Left Lower Leg - Anterior Wound Open Wounding Event: Gradually Appeared Status: Date Acquired: 08/02/2016 Comorbid Asthma, Congestive Heart Failure, Coronary Weeks Of Treatment: 3 History: Artery Disease, Hypertension, Type II Diabetes, Clustered Wound: No Osteoarthritis Wound Measurements Length: (cm) 8.5 Width: (cm) 3.5 Depth: (cm) 0.1 Area: (cm) 23.366 Volume: (cm) 2.337 % Reduction in Area: -197.5% % Reduction in Volume: -197.7% Epithelialization: None Tunneling: No Undermining: No Wound Description Classification: Grade 1 Wound Margin: Distinct, outline attached Exudate Amount: Large Exudate Type: Serous Exudate Color: amber Foul Odor After Cleansing: No Slough/Fibrino Yes Wound Bed Granulation Amount: None Present (0%) Necrotic Amount: Large (67-100%) Necrotic Quality: Eschar, Adherent Slough Periwound Skin Texture Texture Color No Abnormalities Noted: No No Abnormalities Noted: No Erythema: Yes Moisture Erythema Location: Circumferential No Abnormalities Noted: No Maceration: Yes Temperature / Pain Temperature: No Abnormality Tenderness on Palpation: Yes Wound Preparation Ulcer Cleansing: Rinsed/Irrigated with Saline, Other: soap and water, Topical Anesthetic Applied: Other: lidocaine 4%, Treatment Notes Wound #2 (Left, Anterior Lower Leg) 1. Cleansed with: Clean wound with Normal Saline Aggarwal, Alyda B. ([id number]) Cleanse wound with antibacterial soap and water 2. Anesthetic Topical Lidocaine 4% cream to wound bed prior to debridement 4. Dressing Applied: Other dressing (specify in notes) 5. Secondary Dressing Applied ABD Pad 7. Secured with Tape Notes xtrasorb, silvercel, kerlix, coban Electronic Signature(s) Signed: 10/23/2016 4:51:17 PM By:  Alric Quan Entered By: Alric Quan on 10/23/2016 09:41:21 [name] ([id number]) -------------------------------------------------------------------------------- Wound Assessment Details Patient Name: KEEGHAN, MCINTIRE B. Date of Service: 10/23/2016 8:45 AM Medical Record Number: [id number] Patient Account Number: [id number] Date of Birth/Sex: 1945/09/30 (71 y.o. Female) Treating RN: Carolyne Fiscal, Debi Primary Care Millette Halberstam: Lelon Huh Other Clinician: Referring Bravlio Luca: Lelon Huh Treating Coron Rossano/Extender: Frann Rider in Treatment: 3 Wound Status Wound Number: 3 Primary Diabetic Wound/Ulcer of the Lower Extremity Etiology: Wound Location: Left Lower Leg - Medial Wound Open Wounding Event: Gradually Appeared Status: Date Acquired: 08/02/2016 Comorbid Asthma, Congestive Heart Failure, Coronary Weeks Of Treatment: 3 History: Artery Disease, Hypertension, Type II Diabetes, Clustered Wound: No Osteoarthritis Wound Measurements Length: (cm) 3.1 Width: (cm) 2.1 Depth: (cm) 0.1 Area: (cm) 5.113 Volume: (cm) 0.511 % Reduction in Area: 7% % Reduction in Volume: 7.1% Epithelialization: None Tunneling: No Undermining: No Wound Description Classification: Grade 1 Wound Margin: Distinct, outline attached Exudate Amount: Large Exudate Type: Serous Exudate Color: amber Foul Odor After Cleansing: No Slough/Fibrino Yes Wound Bed Granulation Amount: None Present (0%) Necrotic Amount: Large (67-100%) Necrotic Quality: Eschar, Adherent Slough Periwound Skin Texture Texture Color No Abnormalities Noted: No No Abnormalities Noted: No  Erythema: Yes Moisture Erythema Location: Circumferential No Abnormalities Noted: No Maceration: Yes Temperature / Pain Temperature: No Abnormality Tenderness on Palpation: Yes Wound Preparation Ulcer Cleansing: Rinsed/Irrigated with Saline, Other: soap and water, Topical Anesthetic Applied: None Treatment  Notes Wound #3 (Left, Medial Lower Leg) 1. Cleansed with: Clean wound with Normal Saline Hilbun, Tarena B. ([id number]) Cleanse wound with antibacterial soap and water 2. Anesthetic Topical Lidocaine 4% cream to wound bed prior to debridement 3. Peri-wound Care: Barrier cream 4. Dressing Applied: Santyl Ointment 5. Secondary Dressing Applied ABD Pad Dry Gauze 7. Secured with Tape Notes xtrasorb, kerlix, coban Electronic Signature(s) Signed: 10/23/2016 2:22:08 PM By: Alric Quan Entered By: Alric Quan on 10/23/2016 14:22:07 [name] ([id number]) -------------------------------------------------------------------------------- Wound Assessment Details Patient Name: RONALD, VINSANT B. Date of Service: 10/23/2016 8:45 AM Medical Record Number: [id number] Patient Account Number: [id number] Date of Birth/Sex: 04/26/1945 (71 y.o. Female) Treating RN: Carolyne Fiscal, Debi Primary Care Serafina Topham: Lelon Huh Other Clinician: Referring Avrohom Mckelvin: Lelon Huh Treating Jericka Kadar/Extender: Frann Rider in Treatment: 3 Wound Status Wound Number: 6 Primary Diabetic Wound/Ulcer of the Lower Extremity Etiology: Wound Location: Right Lower Leg - Lateral Wound Open Wounding Event: Gradually Appeared Status: Date Acquired: 08/02/2016 Comorbid Asthma, Congestive Heart Failure, Coronary Weeks Of Treatment: 3 History: Artery Disease, Hypertension, Type II Diabetes, Clustered Wound: No Osteoarthritis Wound Measurements Length: (cm) 0.1 Width: (cm) 0.1 Depth: (cm) 0.1 Area: (cm) 0.008 Volume: (cm) 0.001 % Reduction in Area: 99.9% % Reduction in Volume: 99.8% Epithelialization: Large (67-100%) Tunneling: No Undermining: No Wound Description Classification: Grade 1 Wound Margin: Distinct, outline attached Exudate Amount: Small Exudate Type: Serosanguineous Exudate Color: red, brown Foul Odor After Cleansing: No Slough/Fibrino No Wound Bed Granulation Amount:  Large (67-100%) Granulation Quality: Red Necrotic Amount: None Present (0%) Periwound Skin Texture Texture Color No Abnormalities Noted: No No Abnormalities Noted: No Erythema: Yes Moisture Erythema Location: Circumferential No Abnormalities Noted: No Maceration: Yes Temperature / Pain Temperature: No Abnormality Tenderness on Palpation: Yes Wound Preparation Ulcer Cleansing: Rinsed/Irrigated with Saline, Other: soap and water, Topical Anesthetic Applied: None Treatment Notes Wound #6 (Right, Lateral Lower Leg) 1. Cleansed with: Clean wound with Normal Saline Mariscal, Lafern B. ([id number]) Cleanse wound with antibacterial soap and water 2. Anesthetic Topical Lidocaine 4% cream to wound bed prior to debridement 4. Dressing Applied: Other dressing (specify in notes) 5. Secondary Dressing Applied ABD Pad 7. Secured with Tape Notes xtrasorb, silvercel, kerlix, coban Electronic Signature(s) Signed: 10/23/2016 4:51:17 PM By: Alric Quan Entered By: Alric Quan on 10/23/2016 09:36:51 Griffith, Jeanice Lim ([id number]) -------------------------------------------------------------------------------- Wound Assessment Details Patient Name: DORISTINE, SHEHAN B. Date of Service: 10/23/2016 8:45 AM Medical Record Number: [id number] Patient Account Number: [id number] Date of Birth/Sex: 09-09-1945 (71 y.o. Female) Treating RN: Carolyne Fiscal, Debi Primary Care Calbert Hulsebus: Lelon Huh Other Clinician: Referring Courage Biglow: Lelon Huh Treating Evi Mccomb/Extender: Frann Rider in Treatment: 3 Wound Status Wound Number: 7 Primary Lymphedema Etiology: Wound Location: Right Lower Leg - Medial Wound Open Wounding Event: Gradually Appeared Status: Date Acquired: 10/23/2016 Comorbid Asthma, Congestive Heart Failure, Coronary Weeks Of Treatment: 0 History: Artery Disease, Hypertension, Type II Diabetes, Clustered Wound: No Osteoarthritis Wound Measurements Length: (cm) 0.2 Width:  (cm) 0.2 Depth: (cm) 0.1 Area: (cm) 0.031 Volume: (cm) 0.003 % Reduction in Area: % Reduction in Volume: Epithelialization: None Tunneling: No Undermining: No Wound Description Classification: Partial Thickness Wound Margin: Distinct, outline attached Exudate Amount: Large Exudate Type: Serous Exudate Color: amber Foul Odor After Cleansing: No Slough/Fibrino No Wound Bed Granulation Amount: Large (67-100%) Exposed Structure  Granulation Quality: Red Fascia Exposed: No Necrotic Amount: None Present (0%) Fat Layer (Subcutaneous Tissue) Exposed: No Tendon Exposed: No Muscle Exposed: No Joint Exposed: No Bone Exposed: No Periwound Skin Texture Texture Color No Abnormalities Noted: No No Abnormalities Noted: No Callus: No Atrophie Blanche: No Crepitus: No Cyanosis: No Excoriation: No Ecchymosis: No Induration: No Erythema: No Rash: No Hemosiderin Staining: No Scarring: No Mottled: No Pallor: No Moisture Rubor: No No Abnormalities Noted: No Dry / Scaly: No Temperature / Pain Maceration: Yes Temperature: No Abnormality Blocher, Cella B. ([id number]) Tenderness on Palpation: Yes Wound Preparation Ulcer Cleansing: Rinsed/Irrigated with Saline, Other: soap and water, Topical Anesthetic Applied: None Treatment Notes Wound #7 (Right, Medial Lower Leg) 1. Cleansed with: Clean wound with Normal Saline Cleanse wound with antibacterial soap and water 2. Anesthetic Topical Lidocaine 4% cream to wound bed prior to debridement 4. Dressing Applied: Other dressing (specify in notes) 5. Secondary Dressing Applied ABD Pad 7. Secured with Tape Notes xtrasorb, silvercel, kerlix, coban Electronic Signature(s) Signed: 10/23/2016 2:23:22 PM By: Alric Quan Entered By: Alric Quan on 10/23/2016 14:23:21 [name] ([id number]) -------------------------------------------------------------------------------- [name] Details Patient Name: Devoria Albe  B. Date of Service: 10/23/2016 8:45 AM Medical Record Number: [id number] Patient Account Number: [id number] Date of Birth/Sex: 1945/04/01 (71 y.o. Female) Treating RN: Carolyne Fiscal, Debi Primary Care Rulon Abdalla: Lelon Huh Other Clinician: Referring Maryetta Shafer: Lelon Huh Treating Roczen Waymire/Extender: Frann Rider in Treatment: 3 Vital Signs Time Taken: 09:23 Temperature (F): 97.6 Height (in): 64 Pulse (bpm): 64 Weight (lbs): 206 Respiratory Rate (breaths/min): 16 Body Mass Index (BMI): 35.4 Blood Pressure (mmHg): 146/61 Reference Range: 80 - 120 mg / dl Electronic Signature(s) Signed: 10/23/2016 4:51:17 PM By: Alric Quan Entered By: Alric Quan on 10/23/2016 09:24:03

## 2016-10-25 DIAGNOSIS — L97222 Non-pressure chronic ulcer of left calf with fat layer exposed: Secondary | ICD-10-CM | POA: Diagnosis not present

## 2016-10-25 DIAGNOSIS — M1990 Unspecified osteoarthritis, unspecified site: Secondary | ICD-10-CM | POA: Diagnosis not present

## 2016-10-25 DIAGNOSIS — Z4800 Encounter for change or removal of nonsurgical wound dressing: Secondary | ICD-10-CM | POA: Diagnosis not present

## 2016-10-25 DIAGNOSIS — L97212 Non-pressure chronic ulcer of right calf with fat layer exposed: Secondary | ICD-10-CM | POA: Diagnosis not present

## 2016-10-25 DIAGNOSIS — I2510 Atherosclerotic heart disease of native coronary artery without angina pectoris: Secondary | ICD-10-CM | POA: Diagnosis not present

## 2016-10-25 DIAGNOSIS — E11622 Type 2 diabetes mellitus with other skin ulcer: Secondary | ICD-10-CM | POA: Diagnosis not present

## 2016-10-25 DIAGNOSIS — I509 Heart failure, unspecified: Secondary | ICD-10-CM | POA: Diagnosis not present

## 2016-10-25 DIAGNOSIS — I272 Pulmonary hypertension, unspecified: Secondary | ICD-10-CM | POA: Diagnosis not present

## 2016-10-25 DIAGNOSIS — Z79891 Long term (current) use of opiate analgesic: Secondary | ICD-10-CM | POA: Diagnosis not present

## 2016-10-25 DIAGNOSIS — I429 Cardiomyopathy, unspecified: Secondary | ICD-10-CM | POA: Diagnosis not present

## 2016-10-25 DIAGNOSIS — J45909 Unspecified asthma, uncomplicated: Secondary | ICD-10-CM | POA: Diagnosis not present

## 2016-10-25 DIAGNOSIS — L97211 Non-pressure chronic ulcer of right calf limited to breakdown of skin: Secondary | ICD-10-CM | POA: Diagnosis not present

## 2016-10-25 DIAGNOSIS — I110 Hypertensive heart disease with heart failure: Secondary | ICD-10-CM | POA: Diagnosis not present

## 2016-10-25 DIAGNOSIS — Z7984 Long term (current) use of oral hypoglycemic drugs: Secondary | ICD-10-CM | POA: Diagnosis not present

## 2016-10-27 DIAGNOSIS — M1990 Unspecified osteoarthritis, unspecified site: Secondary | ICD-10-CM | POA: Diagnosis not present

## 2016-10-27 DIAGNOSIS — J45909 Unspecified asthma, uncomplicated: Secondary | ICD-10-CM | POA: Diagnosis not present

## 2016-10-27 DIAGNOSIS — E11622 Type 2 diabetes mellitus with other skin ulcer: Secondary | ICD-10-CM | POA: Diagnosis not present

## 2016-10-27 DIAGNOSIS — I509 Heart failure, unspecified: Secondary | ICD-10-CM | POA: Diagnosis not present

## 2016-10-27 DIAGNOSIS — I272 Pulmonary hypertension, unspecified: Secondary | ICD-10-CM | POA: Diagnosis not present

## 2016-10-27 DIAGNOSIS — I110 Hypertensive heart disease with heart failure: Secondary | ICD-10-CM | POA: Diagnosis not present

## 2016-10-27 DIAGNOSIS — I429 Cardiomyopathy, unspecified: Secondary | ICD-10-CM | POA: Diagnosis not present

## 2016-10-27 DIAGNOSIS — Z4800 Encounter for change or removal of nonsurgical wound dressing: Secondary | ICD-10-CM | POA: Diagnosis not present

## 2016-10-27 DIAGNOSIS — I2510 Atherosclerotic heart disease of native coronary artery without angina pectoris: Secondary | ICD-10-CM | POA: Diagnosis not present

## 2016-10-27 DIAGNOSIS — L97222 Non-pressure chronic ulcer of left calf with fat layer exposed: Secondary | ICD-10-CM | POA: Diagnosis not present

## 2016-10-27 DIAGNOSIS — Z79891 Long term (current) use of opiate analgesic: Secondary | ICD-10-CM | POA: Diagnosis not present

## 2016-10-27 DIAGNOSIS — L97212 Non-pressure chronic ulcer of right calf with fat layer exposed: Secondary | ICD-10-CM | POA: Diagnosis not present

## 2016-10-27 DIAGNOSIS — Z7984 Long term (current) use of oral hypoglycemic drugs: Secondary | ICD-10-CM | POA: Diagnosis not present

## 2016-10-27 DIAGNOSIS — L97211 Non-pressure chronic ulcer of right calf limited to breakdown of skin: Secondary | ICD-10-CM | POA: Diagnosis not present

## 2016-10-30 ENCOUNTER — Encounter: Payer: Medicare Other | Admitting: Surgery

## 2016-10-30 ENCOUNTER — Other Ambulatory Visit: Payer: Self-pay | Admitting: Family Medicine

## 2016-10-30 DIAGNOSIS — L97229 Non-pressure chronic ulcer of left calf with unspecified severity: Secondary | ICD-10-CM | POA: Diagnosis not present

## 2016-10-30 DIAGNOSIS — I110 Hypertensive heart disease with heart failure: Secondary | ICD-10-CM | POA: Diagnosis not present

## 2016-10-30 DIAGNOSIS — Z880 Allergy status to penicillin: Secondary | ICD-10-CM | POA: Diagnosis not present

## 2016-10-30 DIAGNOSIS — M1990 Unspecified osteoarthritis, unspecified site: Secondary | ICD-10-CM | POA: Diagnosis not present

## 2016-10-30 DIAGNOSIS — Z79899 Other long term (current) drug therapy: Secondary | ICD-10-CM | POA: Diagnosis not present

## 2016-10-30 DIAGNOSIS — I2510 Atherosclerotic heart disease of native coronary artery without angina pectoris: Secondary | ICD-10-CM | POA: Diagnosis not present

## 2016-10-30 DIAGNOSIS — E11622 Type 2 diabetes mellitus with other skin ulcer: Secondary | ICD-10-CM | POA: Diagnosis not present

## 2016-10-30 DIAGNOSIS — L97829 Non-pressure chronic ulcer of other part of left lower leg with unspecified severity: Secondary | ICD-10-CM | POA: Diagnosis not present

## 2016-10-30 DIAGNOSIS — I272 Pulmonary hypertension, unspecified: Secondary | ICD-10-CM | POA: Diagnosis not present

## 2016-10-30 DIAGNOSIS — J45909 Unspecified asthma, uncomplicated: Secondary | ICD-10-CM | POA: Diagnosis not present

## 2016-10-30 DIAGNOSIS — L97212 Non-pressure chronic ulcer of right calf with fat layer exposed: Secondary | ICD-10-CM | POA: Diagnosis not present

## 2016-10-30 DIAGNOSIS — I429 Cardiomyopathy, unspecified: Secondary | ICD-10-CM | POA: Diagnosis not present

## 2016-10-30 DIAGNOSIS — Z7984 Long term (current) use of oral hypoglycemic drugs: Secondary | ICD-10-CM | POA: Diagnosis not present

## 2016-10-30 DIAGNOSIS — I509 Heart failure, unspecified: Secondary | ICD-10-CM | POA: Diagnosis not present

## 2016-10-30 DIAGNOSIS — I890 Lymphedema, not elsewhere classified: Secondary | ICD-10-CM | POA: Diagnosis not present

## 2016-10-30 DIAGNOSIS — L97222 Non-pressure chronic ulcer of left calf with fat layer exposed: Secondary | ICD-10-CM | POA: Diagnosis not present

## 2016-10-30 MED ORDER — HYDROCODONE-ACETAMINOPHEN 10-325 MG PO TABS: 1.0000 | ORAL_TABLET | Freq: Three times a day (TID) | ORAL | 0 refills | Status: DC | PRN

## 2016-10-30 NOTE — Telephone Encounter (Signed)
Pt's husband called stating pt is almost out of medication and needs a refill.  States she only has two pills left.   Requesting refill of Hydrocodone 10-325.    Pt's husband also requesting a 81 or 90 day supply.  I advise patient federal law prevents [place] from doing that and maybe he could discuss options with Dr. Caryn Section.

## 2016-10-31 NOTE — Progress Notes (Addendum)
HONESTY, MENTA ([id number]) Visit Report for 10/30/2016 Chief Complaint Document Details Patient Name: [name], [name]. Date of Service: 10/30/2016 12:30 PM Medical Record Number: [id number] Patient Account Number: [id number] Date of Birth/Sex: [birth year] (71 y.o. Female) Treating RN: Ahmed Prima Primary Care Provider: Lelon Huh Other Clinician: Referring Provider: Lelon Huh Treating Provider/Extender: Frann Rider in Treatment: 4 Information Obtained from: Patient Chief Complaint Patient seen for complaints of Non-Healing Wounds to both lower extremities which she's had for about 2 months with swelling. Electronic Signature(s) Signed: 10/30/2016 1:10:22 PM By: Christin Fudge MD, FACS Entered By: Christin Fudge on 10/30/2016 13:10:22 [name] ([id number]) -------------------------------------------------------------------------------- HPI Details Patient Name: [name], Jillian B. Date of Service: 10/30/2016 12:30 PM Medical Record Number: [id number] Patient Account Number: [id number] Date of Birth/Sex: 1945-12-17 (71 y.o. Female) Treating RN: Ahmed Prima Primary Care Provider: Lelon Huh Other Clinician: Referring Provider: Lelon Huh Treating Provider/Extender: Frann Rider in Treatment: 4 History of Present Illness Location: both lower extremity swelling with ulceration Quality: Patient reports experiencing a sharp pain to affected area(s). Severity: Patient states wound are getting worse. Duration: Patient has had the wound for > 2 months prior to seeking treatment at the wound center Timing: Pain in wound is constant (hurts all the time) and is worse when she elevates her legs Context: The wound would happen gradually Modifying Factors: Other treatment(s) tried include:she has been on ciprofloxacin and has been applying Silvadene ointment Associated Signs and Symptoms: Patient reports having increase swelling. HPI Description: 71 year old  patient who sees her PCP Dr. Lelon Huh was recently evaluated 10 days ago for diabetes mellitus, hypertension, CHF and hyperlipidemia. she also was noted to have ulcerations develop in her legs and she has been applying Silvadene dressings locally. In the past she has refused wound care referrals.her cardiologist Dr. Saunders Revel saw her and put her on 40 mg of furosemide daily. last hemoglobin A1c was 7.7%. she was also placed on ciprofloxacin twice daily for 7 days and a urine culture was recommended. past medical history significant for coronary artery disease, diabetes mellitus, nonischemic cardiomyopathy and pulmonary hypertension. She is also status post heart catheterization and coronary angiography, tubal ligation and breast cyst removal in the past. She has never been a smoker. Patient had arterial studies done which showed bilateral ABIs are artificially elevated due to noncompressible and calcified vessels. Triphasic waveform throughout. Right great toe TBI is elevated while the left is normal. 10/23/2016 -- the patient is rather moribund from several issues including chronic back pain and knee pain and swelling of her legs. The large necrotic area on her left lateral anterior calf was bleeding on touch after washing her leg. There was a spot which needed silver nitrate cauterization and this was done appropriately. Electronic Signature(s) Signed: 10/30/2016 1:10:28 PM By: Christin Fudge MD, FACS Entered By: Christin Fudge on 10/30/2016 13:10:28 [name] ([id number]) -------------------------------------------------------------------------------- Physical Exam Details Patient Name: [name], [name] B. Date of Service: 10/30/2016 12:30 PM Medical Record Number: [id number] Patient Account Number: [id number] Date of Birth/Sex: [birth year] (71 y.o. Female) Treating RN: Ahmed Prima Primary Care Provider: Lelon Huh Other Clinician: Referring Provider: Lelon Huh Treating  Provider/Extender: Frann Rider in Treatment: 4 Constitutional . Pulse regular. Respirations normal and unlabored. Afebrile. . Eyes Nonicteric. Reactive to light. Ears, Nose, Mouth, and Throat Lips, teeth, and gums WNL.Marland Kitchen Moist mucosa without lesions. Neck supple and nontender. No palpable supraclavicular or cervical adenopathy. Normal sized without goiter. Respiratory WNL. No retractions.. Cardiovascular Pedal Pulses WNL. No clubbing,  cyanosis or edema. Lymphatic No adneopathy. No adenopathy. No adenopathy. Musculoskeletal Adexa without tenderness or enlargement.. Digits and nails w/o clubbing, cyanosis, infection, petechiae, ischemia, or inflammatory conditions.. Integumentary (Hair, Skin) No suspicious lesions. No crepitus or fluctuance. No peri-wound warmth or erythema. No masses.Marland Kitchen Psychiatric Judgement and insight Intact.. No evidence of depression, anxiety, or agitation.. Notes the right leg is looking much better and there are no open areas. The left lower extremity has significant amount of slough over the 2 major wounds and the small areas are looking clean. No sharp debridement is attempted because of profuse bleeding from these wounds in the past Electronic Signature(s) Signed: 10/30/2016 1:11:32 PM By: Christin Fudge MD, FACS Entered By: Christin Fudge on 10/30/2016 13:11:30 [name] ([id number]) -------------------------------------------------------------------------------- Physician Orders Details Patient Name: Jillian Albe B. Date of Service: 10/30/2016 12:30 PM Medical Record Number: [id number] Patient Account Number: [id number] Date of Birth/Sex: 04/29/1945 (71 y.o. 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 o Cleanse wound with mild soap and water o May Shower, gently pat wound dry prior to applying new dressing. Wound #2 Left,Anterior Lower Leg o Clean wound with Normal Saline. o Cleanse wound with mild soap and water o May Shower, gently pat wound dry prior to applying new dressing. Wound #3 Left,Medial Lower Leg o Clean wound with Normal Saline. o Cleanse wound with mild soap and water o May Shower, gently pat wound dry prior to applying new dressing. Wound #6 Right,Lateral Lower Leg o Clean wound with Normal Saline. o Cleanse wound with mild soap and water o May Shower, gently pat wound dry prior to applying new dressing. Wound #7 Right,Medial Lower Leg o Clean wound with Normal Saline. o Cleanse wound with mild soap and water o May Shower, gently pat wound dry prior to applying new dressing. Anesthetic Wound #1 Left,Lateral Lower Leg o Topical Lidocaine 4% cream applied to wound bed prior to debridement - for clinic use Wound #2 Left,Anterior Lower Leg o Topical Lidocaine 4% cream applied to wound bed prior to debridement - for clinic use Wound #3 Left,Medial Lower Leg o Topical Lidocaine 4% cream applied to wound bed prior to debridement - for clinic use Wound #6 Right,Lateral Lower Leg MURRIEL, HOLWERDA ([id number]) o Topical Lidocaine 4% cream applied to wound bed prior to debridement - for clinic use Wound #7 Right,Medial Lower Leg o Topical Lidocaine 4% cream applied to wound bed prior to debridement - for clinic use Skin Barriers/Peri-Wound Care Wound #1 Left,Lateral Lower  Leg o Barrier cream Wound #2 Left,Anterior Lower Leg o Barrier cream Wound #3 Left,Medial Lower Leg o Barrier cream Primary Wound Dressing Wound #1 Left,Lateral Lower Leg o Santyl Ointment Wound #2 Left,Anterior Lower Leg o Silvercel Non-Adherent Wound #3 Left,Medial Lower Leg o Santyl Ointment Wound #6 Right,Lateral Lower Leg o Silvercel Non-Adherent Wound #7 Right,Medial Lower Leg o [place]cel Non-Adherent Secondary Dressing Wound #1 Left,Lateral Lower Leg o ABD pad o Dry Gauze o XtraSorb Wound #2 Left,Anterior Lower Leg o ABD pad o  Dry Gauze o XtraSorb Wound #3 Left,Medial Lower Leg o ABD pad o Dry Gauze o XtraSorb Wound #6 Right,Lateral Lower Leg o ABD pad o Dry Gauze o XtraSorb Wound #7 Right,Medial Lower Leg o ABD pad o Dry Gauze [address], LATIKA KRONICK ([id number]) Dressing Change Frequency Wound #1 Left,Lateral Lower Leg o Change Dressing Monday, Wednesday, Friday - HHRN to change Wednesday and Fridays and pt to come to [place] on Mondays. Wound #2 Left,Anterior Lower Leg o Change Dressing Monday, Wednesday, Friday - HHRN to change Wednesday and Fridays and pt to come to [place] on Mondays. Wound #3 Left,Medial Lower Leg o Change Dressing Monday, Wednesday, Friday - HHRN to change Wednesday and Fridays and pt to come to [place] on Mondays. Wound #6 Right,Lateral Lower Leg o Change Dressing Monday, Wednesday, Friday - HHRN to change Wednesday and Fridays and pt to come to [place] on Mondays. Wound #7 Right,Medial Lower Leg o Change Dressing Monday, Wednesday, Friday - HHRN to change Wednesday and Fridays and pt to come to [place] on Mondays. Follow-up Appointments Wound #1 Left,Lateral Lower Leg o Return Appointment in 1 week. Wound #2 Left,Anterior Lower Leg o Return Appointment in 1 week. Wound #3 Left,Medial Lower Leg o Return  Appointment in 1 week. Wound #6 Right,Lateral Lower Leg o Return Appointment in 1 week. Wound #7 Right,Medial Lower Leg o Return Appointment in 1 week. Edema Control Wound #1 Left,Lateral Lower Leg o Kerlix and Coban - Bilateral - from 3cm from toes to 3cm from knees o Elevate legs to the level of the heart and pump ankles as often as possible Wound #2 Left,Anterior Lower Leg o Kerlix and Coban - Bilateral - from 3cm from toes to 3cm from knees o Elevate legs to the level of the heart and pump ankles as often as possible Wound #3 Left,Medial Lower Leg o Kerlix and Coban - Bilateral - from 3cm from toes to 3cm from knees o Elevate legs to the level of the heart and pump ankles as often as possible Wound #6 Right,Lateral Lower Leg o Kerlix and Coban - Bilateral - from 3cm from toes to 3cm from knees o Elevate legs to the level of the heart and pump ankles as often as possible Wound #7 Right,Medial Lower Leg [name], [name] B. ([id number]) o Kerlix and Coban - Bilateral - from 3cm from toes to 3cm from knees o Elevate legs to the level of the heart and pump ankles as often as possible Additional Orders / Instructions Wound #1 Left,Lateral Lower Leg o Increase protein intake. Wound #2 Left,Anterior Lower Leg o Increase protein intake. Wound #3 Left,Medial Lower Leg o Increase protein intake. Wound #6 Right,Lateral Lower Leg o Increase protein intake. Wound #7 Right,Medial Lower Leg o Increase protein intake. Home Health Wound #1 [place] Nurse may visit PRN to address patientos wound care needs. o FACE TO FACE ENCOUNTER: MEDICARE and MEDICAID PATIENTS: I certify that this patient is under my care and that I had a face-to-face encounter that meets the physician face-to-face encounter requirements with this patient on this date. The encounter with the patient was in whole or in part for the  following MEDICAL CONDITION: (primary reason for [place]) MEDICAL NECESSITY: I certify, that based on my findings, NURSING services are a medically necessary home health service. HOME BOUND STATUS: I certify that my clinical findings support that this patient is  homebound (i.e., Due to illness or injury, pt requires aid of supportive devices such as crutches, cane, wheelchairs, walkers, the use of special transportation or the assistance of another person to leave their place of residence. There is a normal inability to leave the home and doing so requires considerable and taxing effort. Other absences are for medical reasons / religious services and are infrequent or of short duration when for other reasons). o If current dressing causes regression in wound condition, may D/C ordered dressing product/s and apply Normal Saline Moist Dressing daily until next [place] / Other MD appointment. [place] of regression in wound condition at [phone]. o Please direct any NON-WOUND related issues/requests for orders to patient's Primary Care Physician Wound #2 [place] Nurse may visit PRN to address patientos wound care needs. o FACE TO FACE ENCOUNTER: MEDICARE and MEDICAID PATIENTS: I certify that this patient is under my care and that I had a face-to-face encounter that meets the physician face-to-face encounter requirements with this patient on this date. The encounter with the patient was in whole or in part for the following MEDICAL CONDITION: (primary reason for [place]) MEDICAL NECESSITY: I certify, that based on my findings, NURSING services are a medically necessary home health service. HOME BOUND STATUS: I certify that my clinical findings support that this patient is homebound (i.e., Due to illness or injury, pt requires aid of supportive devices such as crutches, cane,  wheelchairs, walkers, the use of special transportation or the assistance of another person to leave their place of residence. There is a normal inability to leave the home and doing so requires considerable and taxing effort. Other absences are for medical reasons / religious services and are infrequent or of short duration when for other reasons). o If current dressing causes regression in wound condition, may D/C ordered dressing product/s and apply Normal Saline Moist Dressing daily until next [place] / Other MD appointment. [place] of regression in wound condition at [phone]. o Please direct any NON-WOUND related issues/requests for orders to patient's Primary Care Physician Wound #3 Left,Medial Lower Leg BRI, WAKEMAN ([id number]) o [place] Nurse may visit PRN to address patientos wound care needs. o FACE TO FACE ENCOUNTER: MEDICARE and MEDICAID PATIENTS: I certify that this patient is under my care and that I had a face-to-face encounter that meets the physician face-to-face encounter requirements with this patient on this date. The encounter with the patient was in whole or in part for the following MEDICAL CONDITION: (primary reason for [place]) MEDICAL NECESSITY: I certify, that based on my findings, NURSING services are a medically necessary home health service. HOME BOUND STATUS: I certify that my clinical findings support that this patient is homebound (i.e., Due to illness or injury, pt requires aid of supportive devices such as crutches, cane, wheelchairs, walkers, the use of special transportation or the assistance of another person to leave their place of residence. There is a normal inability to leave the home and doing so requires considerable and taxing effort. Other absences are for medical reasons / religious services and are infrequent or of short duration when for other  reasons). o If current dressing causes regression in wound condition, may D/C ordered dressing product/s and apply Normal Saline Moist Dressing daily until next [place] / Other MD appointment. [place] of regression in  wound condition at [phone]. o Please direct any NON-WOUND related issues/requests for orders to patient's Primary Care Physician Wound #6 [place] Nurse may visit PRN to address patientos wound care needs. o FACE TO FACE ENCOUNTER: MEDICARE and MEDICAID PATIENTS: I certify that this patient is under my care and that I had a face-to-face encounter that meets the physician face-to-face encounter requirements with this patient on this date. The encounter with the patient was in whole or in part for the following MEDICAL CONDITION: (primary reason for [place]) MEDICAL NECESSITY: I certify, that based on my findings, NURSING services are a medically necessary home health service. HOME BOUND STATUS: I certify that my clinical findings support that this patient is homebound (i.e., Due to illness or injury, pt requires aid of supportive devices such as crutches, cane, wheelchairs, walkers, the use of special transportation or the assistance of another person to leave their place of residence. There is a normal inability to leave the home and doing so requires considerable and taxing effort. Other absences are for medical reasons / religious services and are infrequent or of short duration when for other reasons). o If current dressing causes regression in wound condition, may D/C ordered dressing product/s and apply Normal Saline Moist Dressing daily until next [place] / Other MD appointment. [place] of regression in wound condition at [phone]. o Please direct any NON-WOUND related issues/requests for orders to patient's Primary Care  Physician Wound #7 Right,Medial Lower Leg o [place] Visits o Home Health Nurse may visit PRN to address patientos wound care needs. o FACE TO FACE ENCOUNTER: MEDICARE and MEDICAID PATIENTS: I certify that this patient is under my care and that I had a face-to-face encounter that meets the physician face-to-face encounter requirements with this patient on this date. The encounter with the patient was in whole or in part for the following MEDICAL CONDITION: (primary reason for [place]) MEDICAL NECESSITY: I certify, that based on my findings, NURSING services are a medically necessary home health service. HOME BOUND STATUS: I certify that my clinical findings support that this patient is homebound (i.e., Due to illness or injury, pt requires aid of supportive devices such as crutches, cane, wheelchairs, walkers, the use of special transportation or the assistance of another person to leave their place of residence. There is a normal inability to leave the home and doing so requires considerable and taxing effort. Other absences are for medical reasons / religious services and are infrequent or of short duration when for other reasons). o If current dressing causes regression in wound condition, may D/C ordered dressing product/s and apply Normal Saline Moist Dressing daily until next [place] / Other MD appointment. [place] of regression in wound condition at [phone]. o Please direct any NON-WOUND related issues/requests for orders to patient's Primary Care Physician Medications-please add to medication list. Wound #1 Left,Lateral Lower Leg o P.O. Antibiotics - start Doxycycline o Santyl Enzymatic Ointment o Other: - Vitamin C, Zinc, Multivitamin [place], Madisin B. ([id number]) Wound #2 Left,Anterior Lower Leg o P.O. Antibiotics - start Doxycycline o Santyl Enzymatic Ointment o Other: - Vitamin C, Zinc,  Multivitamin Wound #3 Left,Medial Lower Leg o P.O. Antibiotics - start Doxycycline o Santyl Enzymatic Ointment o Other: - Vitamin C, Zinc, Multivitamin Wound #6 Right,Lateral Lower Leg o P.O. Antibiotics - start Doxycycline o Other: - Vitamin C, Zinc, Multivitamin Wound #7 Right,Medial  Lower Leg o P.O. Antibiotics - start Doxycycline o Other: - Vitamin C, Zinc, Multivitamin Electronic Signature(s) Signed: 10/30/2016 4:25:36 PM By: Christin Fudge MD, FACS Signed: 11/01/2016 4:35:14 PM By: Alric Quan Entered By: Alric Quan on 10/30/2016 13:27:35 [name] ([id number]) -------------------------------------------------------------------------------- Problem List Details Patient Name: SHANEESE, TAIT B. Date of Service: 10/30/2016 12:30 PM Medical Record Number: [id number] Patient Account Number: [id number] Date of Birth/Sex: 1945-10-06 (71 y.o. Female) Treating RN: Ahmed Prima Primary Care Provider: Lelon Huh Other Clinician: Referring Provider: Lelon Huh Treating Provider/Extender: Frann Rider in Treatment: 4 Active Problems ICD-10 Encounter Code Description Active Date Diagnosis E11.622 Type 2 diabetes mellitus with other skin ulcer 10/02/2016 Yes I89.0 Lymphedema, not elsewhere classified 10/02/2016 Yes L97.212 Non-pressure chronic ulcer of right calf with fat layer exposed 10/02/2016 Yes L97.222 Non-pressure chronic ulcer of left calf with fat layer exposed 10/02/2016 Yes Inactive Problems Resolved Problems Electronic Signature(s) Signed: 10/30/2016 1:10:07 PM By: Christin Fudge MD, FACS Entered By: Christin Fudge on 10/30/2016 13:10:06 [name] ([id number]) -------------------------------------------------------------------------------- Progress Note Details Patient Name: Jillian Albe B. Date of Service: 10/30/2016 12:30 PM Medical Record Number: [id number] Patient Account Number: [id number] Date of Birth/Sex: Feb 07, 1945 (71  y.o. Female) Treating RN: Ahmed Prima Primary Care Provider: Lelon Huh Other Clinician: Referring Provider: Lelon Huh Treating Provider/Extender: Frann Rider in Treatment: 4 Subjective Chief Complaint Information obtained from Patient Patient seen for complaints of Non-Healing Wounds to both lower extremities which she's had for about 2 months with swelling. History of Present Illness (HPI) The following HPI elements were documented for the patient's wound: Location: both lower extremity swelling with ulceration Quality: Patient reports experiencing a sharp pain to affected area(s). Severity: Patient states wound are getting worse. Duration: Patient has had the wound for > 2 months prior to seeking treatment at the wound center Timing: Pain in wound is constant (hurts all the time) and is worse when she elevates her legs Context: The wound would happen gradually Modifying Factors: Other treatment(s) tried include:she has been on ciprofloxacin and has been applying Silvadene ointment Associated Signs and Symptoms: Patient reports having increase swelling. 71 year old patient who sees her PCP Dr. Lelon Huh was recently evaluated 10 days ago for diabetes mellitus, hypertension, CHF and hyperlipidemia. she also was noted to have ulcerations develop in her legs and she has been applying Silvadene dressings locally. In the past she has refused wound care referrals.her cardiologist Dr. Saunders Revel saw her and put her on 40 mg of furosemide daily. last hemoglobin A1c was 7.7%. she was also placed on ciprofloxacin twice daily for 7 days and a urine culture was recommended. past medical history significant for coronary artery disease, diabetes mellitus, nonischemic cardiomyopathy and pulmonary hypertension. She is also status post heart catheterization and coronary angiography, tubal ligation and breast cyst removal in the past. She has never been a smoker. Patient had arterial  studies done which showed bilateral ABIs are artificially elevated due to noncompressible and calcified vessels. Triphasic waveform throughout. Right great toe TBI is elevated while the left is normal. 10/23/2016 -- the patient is rather moribund from several issues including chronic back pain and knee pain and swelling of her legs. The large necrotic area on her left lateral anterior calf was bleeding on touch after washing her leg. There was a spot which needed silver nitrate cauterization and this was done appropriately. Patient History Information obtained from Patient. Family History Cancer - Mother,Siblings, Diabetes - Mother,Siblings, Heart Disease - Mother, Hypertension - Siblings, No family history  of Hereditary Spherocytosis, Kidney Disease, Lung Disease, Seizures, Stroke, Thyroid Problems, Tuberculosis. Social History Never smoker, Marital Status - Married, Alcohol Use - Never, Drug Use - No History, Caffeine Use - Daily. ANNAJULIA, [name] ([id number]) Objective Constitutional Pulse regular. Respirations normal and unlabored. Afebrile. Vitals Time Taken: 12:45 PM, Height: 64 in, Weight: 206 lbs, BMI: 35.4, Temperature: 97.6 F, Pulse: 66 bpm, Respiratory Rate: 16 breaths/min, Blood Pressure: 154/74 mmHg. Eyes Nonicteric. Reactive to light. Ears, Nose, Mouth, and Throat Lips, teeth, and gums WNL.Marland Kitchen Moist mucosa without lesions. Neck supple and nontender. No palpable supraclavicular or cervical adenopathy. Normal sized without goiter. Respiratory WNL. No retractions.. Cardiovascular Pedal Pulses WNL. No clubbing, cyanosis or edema. Lymphatic No adneopathy. No adenopathy. No adenopathy. Musculoskeletal Adexa without tenderness or enlargement.. Digits and nails w/o clubbing, cyanosis, infection, petechiae, ischemia, or inflammatory conditions.Marland Kitchen Psychiatric Judgement and insight Intact.. No evidence of depression, anxiety, or agitation.. General Notes: the right leg is looking  much better and there are no open areas. The left lower extremity has significant amount of slough over the 2 major wounds and the small areas are looking clean. No sharp debridement is attempted because of profuse bleeding from these wounds in the past Integumentary (Hair, Skin) No suspicious lesions. No crepitus or fluctuance. No peri-wound warmth or erythema. No masses.. Wound #1 status is Open. Original cause of wound was Gradually Appeared. The wound is located on the Left,Lateral Lower Leg. The wound measures 6cm length x 5cm width x 0.2cm depth; 23.562cm^2 area and 4.712cm^3 volume. There is no tunneling or undermining noted. There is a large amount of serosanguineous drainage noted. Foul odor after cleansing was noted. The wound margin is distinct with the outline attached to the wound base. There is no granulation within the wound bed. There is a large (67-100%) amount of necrotic tissue within the wound bed including Eschar and Adherent Slough. The periwound skin appearance exhibited: Maceration, Erythema. The surrounding wound skin color is noted with erythema which is circumferential. Periwound temperature was noted as No Abnormality. Wound #2 status is Open. Original cause of wound was Gradually Appeared. The wound is located on the Left,Anterior Lower Leg. The wound measures 8.5cm length x 3.5cm width x 0.1cm depth; 23.366cm^2 area and 2.337cm^3 volume. There is no [place], Ladashia B. ([id number]) tunneling or undermining noted. There is a large amount of serous drainage noted. The wound margin is distinct with the outline attached to the wound base. There is no granulation within the wound bed. There is a large (67-100%) amount of necrotic tissue within the wound bed including Eschar and Adherent Slough. The periwound skin appearance exhibited: Maceration, Erythema. The surrounding wound skin color is noted with erythema which is circumferential. Periwound temperature was noted as No  Abnormality. The periwound has tenderness on palpation. Wound #3 status is Open. Original cause of wound was Gradually Appeared. The wound is located on the Left,Medial Lower Leg. The wound measures 2.9cm length x 2cm width x 0.1cm depth; 4.555cm^2 area and 0.456cm^3 volume. There is no tunneling or undermining noted. There is a large amount of serous drainage noted. The wound margin is distinct with the outline attached to the wound base. There is no granulation within the wound bed. There is a large (67-100%) amount of necrotic tissue within the wound bed including Eschar and Adherent Slough. The periwound skin appearance exhibited: Maceration, Erythema. The surrounding wound skin color is noted with erythema which is circumferential. Periwound temperature was noted as No Abnormality. The periwound has tenderness  on palpation. Wound #6 status is Open. Original cause of wound was Gradually Appeared. The wound is located on the Right,Lateral Lower Leg. The wound measures 0.1cm length x 0.1cm width x 0.1cm depth; 0.008cm^2 area and 0.001cm^3 volume. There is no tunneling or undermining noted. There is a large amount of serous drainage noted. The wound margin is distinct with the outline attached to the wound base. There is large (67-100%) red granulation within the wound bed. There is no necrotic tissue within the wound bed. The periwound skin appearance exhibited: Maceration, Erythema. The surrounding wound skin color is noted with erythema which is circumferential. Periwound temperature was noted as No Abnormality. The periwound has tenderness on palpation. Wound #7 status is Open. Original cause of wound was Gradually Appeared. The wound is located on the Right,Medial Lower Leg. The wound measures 0.2cm length x 0.2cm width x 0.1cm depth; 0.031cm^2 area and 0.003cm^3 volume. There is no tunneling or undermining noted. There is a large amount of serous drainage noted. The wound margin is distinct  with the outline attached to the wound base. There is large (67-100%) red granulation within the wound bed. There is no necrotic tissue within the wound bed. The periwound skin appearance exhibited: Maceration. The periwound skin appearance did not exhibit: Callus, Crepitus, Excoriation, Induration, Rash, Scarring, Dry/Scaly, Atrophie Blanche, Cyanosis, Ecchymosis, Hemosiderin Staining, Mottled, Pallor, Rubor, Erythema. Periwound temperature was noted as No Abnormality. The periwound has tenderness on palpation. 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 Cleanse wound with mild soap and water May Shower, gently pat wound dry prior to applying new dressing. Wound #2 Left,Anterior Lower Leg: Clean wound with Normal Saline. JAYDAN, CHRETIEN ([id number]) Cleanse wound with mild soap and water May Shower, gently pat wound dry prior to applying new dressing. Wound #3 Left,Medial Lower Leg: Clean wound with Normal Saline. Cleanse wound with mild soap and water May Shower, gently pat wound dry prior to applying new dressing. Wound #6 Right,Lateral Lower Leg: Clean wound with Normal Saline. Cleanse wound with mild soap and water May Shower, gently pat wound dry prior to applying new dressing. Wound #7 Right,Medial Lower Leg: Clean wound with Normal Saline. Cleanse wound with mild soap and water May Shower, gently pat wound dry prior to applying new dressing. Anesthetic: Wound #1 Left,Lateral Lower Leg: Topical Lidocaine 4% cream applied to wound bed prior to debridement - for clinic use Wound #2 Left,Anterior Lower Leg: Topical Lidocaine 4% cream applied to wound bed  prior to debridement - for clinic use Wound #3 Left,Medial Lower Leg: Topical Lidocaine 4% cream applied to wound bed prior to debridement - for clinic use Wound #6 Right,Lateral Lower Leg: Topical Lidocaine 4% cream applied to wound bed prior to debridement - for clinic use Wound #7 Right,Medial Lower Leg: Topical Lidocaine 4% cream applied to wound bed prior to debridement - for clinic use Skin Barriers/Peri-Wound Care: Wound #1 Left,Lateral Lower Leg: Barrier cream Wound #2 Left,Anterior Lower Leg: Barrier cream Wound #3 Left,Medial Lower Leg: Barrier cream Primary Wound Dressing: Wound #1 Left,Lateral Lower Leg: Santyl Ointment Wound #2 Left,Anterior Lower Leg: Silvercel Non-Adherent Wound #3 Left,Medial Lower Leg: Santyl Ointment Wound #6 Right,Lateral Lower Leg: Silvercel Non-Adherent Wound #7 Right,Medial Lower  Leg: Silvercel Non-Adherent Secondary Dressing: Wound #1 Left,Lateral Lower Leg: ABD pad Dry Gauze XtraSorb Wound #2 Left,Anterior Lower Leg: ABD pad Dry Gauze XtraSorb Wound #3 Left,Medial Lower Leg: ABD pad Dry Gauze XtraSorb Wound #6 Right,Lateral Lower Leg: ABD pad Dry Gauze [name], [name] B. ([id number]) Wound #7 Right,Medial Lower Leg: ABD pad Dry Gauze XtraSorb Dressing Change Frequency: Wound #1 Left,Lateral Lower Leg: Change Dressing Monday, Wednesday, Friday - HHRN to change Wednesday and Fridays and pt to come to [place] on Mondays. Wound #2 Left,Anterior Lower Leg: Change Dressing Monday, Wednesday, Friday - HHRN to change Wednesday and Fridays and pt to come to [place] on Mondays. Wound #3 Left,Medial Lower Leg: Change Dressing Monday, Wednesday, Friday - HHRN to change Wednesday and Fridays and pt to come to [place] on Mondays. Wound #6 Right,Lateral Lower Leg: Change Dressing Monday, Wednesday, Friday - HHRN to change Wednesday and Fridays and pt to come to [place] on Mondays. Wound  #7 Right,Medial Lower Leg: Change Dressing Monday, Wednesday, Friday - HHRN to change Wednesday and Fridays and pt to come to [place] on Mondays. Follow-up Appointments: Wound #1 Left,Lateral Lower Leg: Return Appointment in 1 week. Wound #2 Left,Anterior Lower Leg: Return Appointment in 1 week. Wound #3 Left,Medial Lower Leg: Return Appointment in 1 week. Wound #6 Right,Lateral Lower Leg: Return Appointment in 1 week. Wound #7 Right,Medial Lower Leg: Return Appointment in 1 week. Edema Control: Wound #1 Left,Lateral Lower Leg: Kerlix and Coban - Bilateral - from 3cm from toes to 3cm from knees Elevate legs to the level of the heart and pump ankles as often as possible Wound #2 Left,Anterior Lower Leg: Kerlix and Coban - Bilateral - from 3cm from toes to 3cm from knees Elevate legs to the level of the heart and pump ankles as often as possible Wound #3 Left,Medial Lower Leg: Kerlix and Coban - Bilateral - from 3cm from toes to 3cm from knees Elevate legs to the level of the heart and pump ankles as often as possible Wound #6 Right,Lateral Lower Leg: Kerlix and Coban - Bilateral - from 3cm from toes to 3cm from knees Elevate legs to the level of the heart and pump ankles as often as possible Wound #7 Right,Medial Lower Leg: Kerlix and Coban - Bilateral - from 3cm from toes to 3cm from knees Elevate legs to the level of the heart and pump ankles as often as possible Additional Orders / Instructions: Wound #1 Left,Lateral Lower Leg: Increase protein intake. Wound #2 Left,Anterior Lower Leg: Increase protein intake. Wound #3 Left,Medial Lower Leg: Increase protein intake. Wound #6 Right,Lateral Lower Leg: Increase protein intake. Wound #7 Right,Medial Lower Leg: Increase protein intake. Home Health: Wound #1 Left,Lateral Lower Leg: KARALYNN, COTTONE ([id number]) [place] Nurse may visit PRN to address patient s wound care needs. FACE TO  FACE ENCOUNTER: MEDICARE and MEDICAID PATIENTS: I certify that this patient is under my care and that I had a face-to-face encounter that meets the physician face-to-face encounter requirements with this patient on this date. The encounter with the patient was in whole or in part for the following MEDICAL CONDITION: (primary reason for [place]) MEDICAL NECESSITY: I certify, that based on my findings, NURSING services are a medically necessary home health service. HOME BOUND STATUS: I certify that my clinical findings support that this patient is homebound (i.e., Due to illness or injury, pt requires aid of supportive devices such as  crutches, cane, wheelchairs, walkers, the use of special transportation or the assistance of another person to leave their place of residence. There is a normal inability to leave the home and doing so requires considerable and taxing effort. Other absences are for medical reasons / religious services and are infrequent or of short duration when for other reasons). If current dressing causes regression in wound condition, may D/C ordered dressing product/s and apply Normal Saline Moist Dressing daily until next [place] / Other MD appointment. [place] of regression in wound condition at [phone]. Please direct any NON-WOUND related issues/requests for orders to patient's Primary Care Physician Wound #2 Left,Anterior Lower Leg: [place] Nurse may visit PRN to address patient s wound care needs. FACE TO FACE ENCOUNTER: MEDICARE and MEDICAID PATIENTS: I certify that this patient is under my care and that I had a face-to-face encounter that meets the physician face-to-face encounter requirements with this patient on this date. The encounter with the patient was in whole or in part for the following MEDICAL CONDITION: (primary reason for [place]) MEDICAL NECESSITY: I certify, that based on my  findings, NURSING services are a medically necessary home health service. HOME BOUND STATUS: I certify that my clinical findings support that this patient is homebound (i.e., Due to illness or injury, pt requires aid of supportive devices such as crutches, cane, wheelchairs, walkers, the use of special transportation or the assistance of another person to leave their place of residence. There is a normal inability to leave the home and doing so requires considerable and taxing effort. Other absences are for medical reasons / religious services and are infrequent or of short duration when for other reasons). If current dressing causes regression in wound condition, may D/C ordered dressing product/s and apply Normal Saline Moist Dressing daily until next [place] / Other MD appointment. [place] of regression in wound condition at [phone]. Please direct any NON-WOUND related issues/requests for orders to patient's Primary Care Physician Wound #3 Left,Medial Lower Leg: [place] Nurse may visit PRN to address patient s wound care needs. FACE TO FACE ENCOUNTER: MEDICARE and MEDICAID PATIENTS: I certify that this patient is under my care and that I had a face-to-face encounter that meets the physician face-to-face encounter requirements with this patient on this date. The encounter with the patient was in whole or in part for the following MEDICAL CONDITION: (primary reason for [place]) MEDICAL NECESSITY: I certify, that based on my findings, NURSING services are a medically necessary home health service. HOME BOUND STATUS: I certify that my clinical findings support that this patient is homebound (i.e., Due to illness or injury, pt requires aid of supportive devices such as crutches, cane, wheelchairs, walkers, the use of special transportation or the assistance of another person to leave their place of residence. There is a  normal inability to leave the home and doing so requires considerable and taxing effort. Other absences are for medical reasons / religious services and are infrequent or of short duration when for other reasons). If current dressing causes regression in wound condition, may D/C ordered dressing product/s and apply Normal Saline Moist Dressing daily until next [place] / Other MD appointment. [place] of regression in wound condition at [phone]. Please direct any NON-WOUND related issues/requests for orders to patient's Primary Care Physician Wound #6 Right,Lateral Lower Leg: [place]  Nurse may visit PRN to address patient s wound care needs. FACE TO FACE ENCOUNTER: MEDICARE and MEDICAID PATIENTS: I certify that this patient is under my care and that I had a face-to-face encounter that meets the physician face-to-face encounter requirements with this patient on this date. The encounter with the patient was in whole or in part for the following MEDICAL CONDITION: (primary reason for [place]) MEDICAL NECESSITY: I certify, that based on my findings, NURSING services are a medically necessary home health service. HOME BOUND STATUS: I certify that my clinical findings support that this patient is homebound (i.e., Due to illness or injury, pt requires aid of supportive devices such as crutches, cane, wheelchairs, walkers, the use of special transportation or the assistance of another person to leave their place of residence. There is a normal inability to leave the home and doing so requires considerable and taxing effort. Other absences are for medical reasons / religious services and are infrequent or of short duration when for other reasons). If current dressing causes regression in wound condition, may D/C ordered dressing product/s and apply Normal Saline AYSHIA, GRAMLICH B. ([id number]) Moist Dressing daily until next [place] / Other MD appointment. [place] of regression in wound condition at [phone]. Please direct any NON-WOUND related issues/requests for orders to patient's Primary Care Physician Wound #7 Right,Medial Lower Leg: [place] Nurse may visit PRN to address patient s wound care needs. FACE TO FACE ENCOUNTER: MEDICARE and MEDICAID PATIENTS: I certify that this patient is under my care and that I had a face-to-face encounter that meets the physician face-to-face encounter requirements with this patient on this date. The encounter with the patient was in whole or in part for the following MEDICAL CONDITION: (primary reason for [place]) MEDICAL NECESSITY: I certify, that based on my findings, NURSING services are a medically necessary home health service. HOME BOUND STATUS: I certify that my clinical findings support that this patient is homebound (i.e., Due to illness or injury, pt requires aid of supportive devices such as crutches, cane, wheelchairs, walkers, the use of special transportation or the assistance of another person to leave their place of residence. There is a normal inability to leave the home and doing so requires considerable and taxing effort. Other absences are for medical reasons / religious services and are infrequent or of short duration when for other reasons). If current dressing causes regression in wound condition, may D/C ordered dressing product/s and apply Normal Saline Moist Dressing daily until next [place] / Other MD appointment. [place] of regression in wound condition at [phone]. Please direct any NON-WOUND related issues/requests for orders to patient's Primary Care Physician Medications-please add to medication list.: Wound #1 Left,Lateral Lower Leg: P.O. Antibiotics - start Doxycycline Santyl Enzymatic Ointment Other: - Vitamin C, Zinc,  Multivitamin Wound #2 Left,Anterior Lower Leg: P.O. Antibiotics - start Doxycycline Santyl Enzymatic Ointment Other: - Vitamin C, Zinc, Multivitamin Wound #3 Left,Medial Lower Leg: P.O. Antibiotics - start Doxycycline Santyl Enzymatic Ointment Other: - Vitamin C, Zinc, Multivitamin Wound #6 Right,Lateral Lower Leg: P.O. Antibiotics - start Doxycycline Other: - Vitamin C, Zinc, Multivitamin Wound #7 Right,Medial Lower Leg: P.O. Antibiotics - start Doxycycline Other: - Vitamin C, Zinc, Multivitamin After review today, I have recommended: 1. Silver alginate to the right lower extremity and Santyl ointment to the left lower extremity to be wrapped with Kerlix and Coban to be changed 3 times a  week 2. Elevation and exercise discussed with her in great detail 3. her husband who is the caregiver and is at the bedside, has had several questions which I have answered to his satisfaction. 4. Regular visits to the wound center. she and her husband have had all questions answered and will be agreeable to the treatment plan Electronic Signature(s) Signed: 10/30/2016 2:51:12 PM By: Christin Fudge MD, FACS [name], [name] ([id number]) Previous Signature: 10/30/2016 1:13:00 PM Version By: Christin Fudge MD, FACS Entered By: Christin Fudge on 10/30/2016 14:51:12 [name] ([id number]) -------------------------------------------------------------------------------- ROS/PFSH Details Patient Name: [name], [name] B. Date of Service: 10/30/2016 12:30 PM Medical Record Number: [id number] Patient Account Number: [id number] Date of Birth/Sex: 09-08-1945 (71 y.o. Female) Treating RN: Carolyne Fiscal, Debi Primary Care Provider: Lelon Huh Other Clinician: Referring Provider: Lelon Huh Treating Provider/Extender: Frann Rider in Treatment: 4 Information Obtained From Patient Wound History Do you currently have one or more open woundso Yes How many open wounds do you currently haveo  6 Approximately how long have you had your woundso 2 months How have you been treating your wound(s) until nowo [place] Has your wound(s) ever healed and then re-openedo No Have you had any lab work done in the past montho No Have you tested positive for an antibiotic resistant organism (MRSA, VRE)o No Have you tested positive for osteomyelitis (bone infection)o No Have you had any tests for circulation on your legso Yes Where was the test doneo [facility] Have you had other problems associated with your woundso Infection, Swelling Respiratory Medical History: Positive for: Asthma Cardiovascular Medical History: Positive for: Congestive Heart Failure; Coronary Artery Disease; Hypertension Endocrine Medical History: Positive for: Type II Diabetes Time with diabetes: a long time Treated with: Oral agents Blood sugar tested every day: No Musculoskeletal Medical History: Positive for: Osteoarthritis Immunizations Pneumococcal Vaccine: Received Pneumococcal Vaccination: Yes Implantable Devices Family and Social History Cancer: Yes - Mother,Siblings; Diabetes: Yes - Mother,Siblings; Heart Disease: Yes - Mother; Hereditary Spherocytosis: No; Hypertension: Yes - Siblings; Kidney Disease: No; Lung Disease: No; Seizures: No; Stroke: No; Thyroid Problems: No; [name], [name] B. ([id number]) Tuberculosis: No; Never smoker; Marital Status - Married; Alcohol Use: Never; Drug Use: No History; Caffeine Use: Daily; Financial Concerns: No; Food, Clothing or Shelter Needs: No; Support System Lacking: No; Transportation Concerns: No; Advanced Directives: No; Patient does not want information on Advanced Directives; Do not resuscitate: No; Living Will: No; Medical Power of Attorney: No Physician Affirmation I have reviewed and agree with the above information. Electronic Signature(s) Signed: 10/30/2016 4:25:36 PM By: Christin Fudge MD, FACS Signed: 11/01/2016 4:35:14 PM By: Alric Quan Entered By:  Christin Fudge on 10/30/2016 13:10:38 [name] ([id number]) -------------------------------------------------------------------------------- [place] Details Patient Name: [name], [name] B. Date of Service: 10/30/2016 Medical Record Number: [id number] Patient Account Number: [id number] Date of Birth/Sex: [birth year] (71 y.o. 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 Facility Procedures CPT4 Code: [id number] Description: 19379 - WOUND CARE VISIT-LEV 5 EST PT Modifier: Quantity: 1 Physician Procedures CPT4 Code: [id number] Description: 53299 - WC PHYS LEVEL 3 - EST PT ICD-10 Diagnosis Description E11.622 Type 2 diabetes mellitus with other skin ulcer I89.0 Lymphedema, not elsewhere classified  F75.102 Non-pressure chronic ulcer of right calf with fat layer exp L97.222  Non-pressure chronic ulcer of left calf with fat layer expo Modifier: osed sed Quantity: 1 Electronic Signature(s) Signed: 10/30/2016 4:36:56 PM By: Alric Quan Signed: 11/01/2016 2:26:54 PM By: Christin Fudge MD, FACS Previous Signature: 10/30/2016 1:13:28 PM Version By: Christin Fudge MD, FACS Entered By: Alric Quan on 10/30/2016 16:36:56

## 2016-11-01 DIAGNOSIS — L97222 Non-pressure chronic ulcer of left calf with fat layer exposed: Secondary | ICD-10-CM | POA: Diagnosis not present

## 2016-11-01 DIAGNOSIS — I2510 Atherosclerotic heart disease of native coronary artery without angina pectoris: Secondary | ICD-10-CM | POA: Diagnosis not present

## 2016-11-01 DIAGNOSIS — I509 Heart failure, unspecified: Secondary | ICD-10-CM | POA: Diagnosis not present

## 2016-11-01 DIAGNOSIS — L97212 Non-pressure chronic ulcer of right calf with fat layer exposed: Secondary | ICD-10-CM | POA: Diagnosis not present

## 2016-11-01 DIAGNOSIS — L97211 Non-pressure chronic ulcer of right calf limited to breakdown of skin: Secondary | ICD-10-CM | POA: Diagnosis not present

## 2016-11-01 DIAGNOSIS — I110 Hypertensive heart disease with heart failure: Secondary | ICD-10-CM | POA: Diagnosis not present

## 2016-11-01 DIAGNOSIS — Z7984 Long term (current) use of oral hypoglycemic drugs: Secondary | ICD-10-CM | POA: Diagnosis not present

## 2016-11-01 DIAGNOSIS — E11622 Type 2 diabetes mellitus with other skin ulcer: Secondary | ICD-10-CM | POA: Diagnosis not present

## 2016-11-01 DIAGNOSIS — Z4800 Encounter for change or removal of nonsurgical wound dressing: Secondary | ICD-10-CM | POA: Diagnosis not present

## 2016-11-01 DIAGNOSIS — Z79891 Long term (current) use of opiate analgesic: Secondary | ICD-10-CM | POA: Diagnosis not present

## 2016-11-01 DIAGNOSIS — I429 Cardiomyopathy, unspecified: Secondary | ICD-10-CM | POA: Diagnosis not present

## 2016-11-01 DIAGNOSIS — I272 Pulmonary hypertension, unspecified: Secondary | ICD-10-CM | POA: Diagnosis not present

## 2016-11-01 DIAGNOSIS — J45909 Unspecified asthma, uncomplicated: Secondary | ICD-10-CM | POA: Diagnosis not present

## 2016-11-01 DIAGNOSIS — M1990 Unspecified osteoarthritis, unspecified site: Secondary | ICD-10-CM | POA: Diagnosis not present

## 2016-11-03 DIAGNOSIS — I110 Hypertensive heart disease with heart failure: Secondary | ICD-10-CM | POA: Diagnosis not present

## 2016-11-03 DIAGNOSIS — M1990 Unspecified osteoarthritis, unspecified site: Secondary | ICD-10-CM | POA: Diagnosis not present

## 2016-11-03 DIAGNOSIS — Z79891 Long term (current) use of opiate analgesic: Secondary | ICD-10-CM | POA: Diagnosis not present

## 2016-11-03 DIAGNOSIS — L97211 Non-pressure chronic ulcer of right calf limited to breakdown of skin: Secondary | ICD-10-CM | POA: Diagnosis not present

## 2016-11-03 DIAGNOSIS — I509 Heart failure, unspecified: Secondary | ICD-10-CM | POA: Diagnosis not present

## 2016-11-03 DIAGNOSIS — J45909 Unspecified asthma, uncomplicated: Secondary | ICD-10-CM | POA: Diagnosis not present

## 2016-11-03 DIAGNOSIS — L97212 Non-pressure chronic ulcer of right calf with fat layer exposed: Secondary | ICD-10-CM | POA: Diagnosis not present

## 2016-11-03 DIAGNOSIS — Z7984 Long term (current) use of oral hypoglycemic drugs: Secondary | ICD-10-CM | POA: Diagnosis not present

## 2016-11-03 DIAGNOSIS — E11622 Type 2 diabetes mellitus with other skin ulcer: Secondary | ICD-10-CM | POA: Diagnosis not present

## 2016-11-03 DIAGNOSIS — I272 Pulmonary hypertension, unspecified: Secondary | ICD-10-CM | POA: Diagnosis not present

## 2016-11-03 DIAGNOSIS — I2510 Atherosclerotic heart disease of native coronary artery without angina pectoris: Secondary | ICD-10-CM | POA: Diagnosis not present

## 2016-11-03 DIAGNOSIS — I429 Cardiomyopathy, unspecified: Secondary | ICD-10-CM | POA: Diagnosis not present

## 2016-11-03 DIAGNOSIS — Z4800 Encounter for change or removal of nonsurgical wound dressing: Secondary | ICD-10-CM | POA: Diagnosis not present

## 2016-11-03 DIAGNOSIS — L97222 Non-pressure chronic ulcer of left calf with fat layer exposed: Secondary | ICD-10-CM | POA: Diagnosis not present

## 2016-11-03 NOTE — Progress Notes (Signed)
DESTENI, PISCOPO ([id number]) Visit Report for 10/30/2016 Arrival Information Details Patient Name: [name], [name]. Date of Service: 10/30/2016 12:30 PM Medical Record Number: [id number] Patient Account Number: [id number] Date of Birth/Sex: September 08, 1945 (71 y.o. Female) Treating RN: [name] Primary Care [name]: [name] Other Clinician: Referring [name]: [name] Treating [name]/Extender: [name] in Treatment: 4 Visit Information History Since Last Visit All ordered tests and consults were completed: No Patient Arrived: Wheel Chair Added or deleted any medications: No Arrival Time: 12:36 Any new allergies or adverse reactions: No Accompanied By: husband Had a fall or experienced change in No Transfer Assistance: EasyPivot Patient activities of daily living that may affect Lift risk of falls: Patient Identification Verified: Yes Signs or symptoms of abuse/neglect since last visito No Secondary Verification Process Yes Hospitalized since last visit: No Completed: Has Dressing in Place as Prescribed: Yes Patient Requires Transmission-Based No Precautions: Has Compression in Place as Prescribed: Yes Patient Has Alerts: Yes Pain Present Now: Yes Electronic Signature(s) Signed: 11/01/2016 4:35:14 PM By: [name] Entered By: [name] on 10/30/2016 12:39:09 [name] ([id number]) -------------------------------------------------------------------------------- Clinic Level of Care Assessment Details Patient Name: [name] Date of Service: 10/30/2016 12:30 PM Medical Record Number: [id number] Patient Account Number: [id number] Date of Birth/Sex: June 09, 1945 (71 y.o. Female) Treating RN: Carolyne Fiscal, Debi Primary Care [name]: [name] Other Clinician: Referring [name]: [name] Treating [name]/Extender: [name] in Treatment: 4 Clinic Level of Care Assessment Items TOOL 4 Quantity Score X -  Use when only an EandM is performed on FOLLOW-UP visit 1 0 ASSESSMENTS - Nursing Assessment / Reassessment X - Reassessment of Co-morbidities (includes updates in patient status) 1 10 X- 1 5 Reassessment of Adherence to Treatment Plan ASSESSMENTS - Wound and Skin Assessment / Reassessment []  - Simple Wound Assessment / Reassessment - one wound 0 X- 5 5 Complex Wound Assessment / Reassessment - multiple wounds []  - 0 Dermatologic / Skin Assessment (not related to wound area) ASSESSMENTS - Focused Assessment []  - Circumferential Edema Measurements - multi extremities 0 []  - 0 Nutritional Assessment / Counseling / Intervention []  - 0 Lower Extremity Assessment (monofilament, tuning fork, pulses) []  - 0 Peripheral Arterial Disease Assessment (using hand held doppler) ASSESSMENTS - Ostomy and/or Continence Assessment and Care []  - Incontinence Assessment and Management 0 []  - 0 Ostomy Care Assessment and Management (repouching, etc.) PROCESS - Coordination of Care []  - Simple Patient / Family Education for ongoing care 0 X- 1 20 Complex (extensive) Patient / Family Education for ongoing care X- 1 10 Staff obtains Programmer, systems, Records, Test Results / Process Orders X- 1 10 Staff telephones HHA, Nursing Homes / Clarify orders / etc []  - 0 Routine Transfer to another Facility (non-emergent condition) []  - 0 Routine Hospital Admission (non-emergent condition) []  - 0 New Admissions / Biomedical engineer / Ordering NPWT, Apligraf, etc. []  - 0 Emergency Hospital Admission (emergent condition) X- 1 10 Simple Discharge Coordination [name], [name] ([id number]) []  - 0 Complex (extensive) Discharge Coordination PROCESS - Special Needs []  - Pediatric / Minor Patient Management 0 []  - 0 Isolation Patient Management []  - 0 Hearing / Language / Visual special needs []  - 0 Assessment of Community assistance (transportation, D/C planning, etc.) []  - 0 Additional assistance / Altered  mentation []  - 0 Support Surface(s) Assessment (bed, cushion, seat, etc.) INTERVENTIONS - Wound Cleansing / Measurement []  - Simple Wound Cleansing - one wound 0 X- 5 5 Complex Wound Cleansing - multiple wounds X-  1 5 Wound Imaging (photographs - any number of wounds) []  - 0 Wound Tracing (instead of photographs) []  - 0 Simple Wound Measurement - one wound X- 5 5 Complex Wound Measurement - multiple wounds INTERVENTIONS - Wound Dressings []  - Small Wound Dressing one or multiple wounds 0 []  - 0 Medium Wound Dressing one or multiple wounds X- 2 20 Large Wound Dressing one or multiple wounds X- 1 5 Application of Medications - topical []  - 0 Application of Medications - injection INTERVENTIONS - Miscellaneous []  - External ear exam 0 []  - 0 Specimen Collection (cultures, biopsies, blood, body fluids, etc.) []  - 0 Specimen(s) / Culture(s) sent or taken to Lab for analysis []  - 0 Patient Transfer (multiple staff / Civil Service fast streamer / Similar devices) []  - 0 Simple Staple / Suture removal (25 or less) []  - 0 Complex Staple / Suture removal (26 or more) []  - 0 Hypo / Hyperglycemic Management (close monitor of Blood Glucose) []  - 0 Ankle / Brachial Index (ABI) - do not check if billed separately X- 1 5 Vital Signs [name], Jillian B. ([id number]) Has the patient been seen at the hospital within the last three years: Yes Total Score: 195 Level Of Care: New/Established - Level 5 Electronic Signature(s) Signed: 11/01/2016 4:35:14 PM By: [name] Entered By: [name] on 10/30/2016 16:36:48 [name] ([id number]) -------------------------------------------------------------------------------- Encounter Discharge Information Details Patient Name: [name], [name] Date of Service: 10/30/2016 12:30 PM Medical Record Number: [id number] Patient Account Number: [id number] Date of Birth/Sex: 1945-09-07 (71 y.o. Female) Treating RN: [name] Primary Care [name]:  [name] Other Clinician: Referring [name]: [name] Treating Aniket Paye/Extender: [name] in Treatment: 4 Encounter Discharge Information Items Discharge Pain Level: 4 Discharge Condition: Stable Ambulatory Status: Wheelchair Discharge Destination: Home Transportation: Private Auto Accompanied By: husband Schedule Follow-up Appointment: Yes Medication Reconciliation completed and No provided to Patient/Care Brendin Situ: Provided on Clinical Summary of Care: 10/30/2016 Form Type Recipient Paper Patient CS Electronic Signature(s) Signed: 10/30/2016 4:35:52 PM By: [name] Entered By: [name] on 10/30/2016 16:35:51 [name] ([id number]) -------------------------------------------------------------------------------- Lower Extremity Assessment Details Patient Name: Jillian Albe B. Date of Service: 10/30/2016 12:30 PM Medical Record Number: [id number] Patient Account Number: [id number] Date of Birth/Sex: [birth year] (71 y.o. Female) Treating RN: Carolyne Fiscal, Debi Primary Care Aino Heckert: [name] Other Clinician: Referring Nicolena Schurman: [name] Treating Yanina Knupp/Extender: [name] in Treatment: 4 Edema Assessment Assessed: [Left: No] [Right: No] [Left: Edema] [Right: :] Calf Left: Right: Point of Measurement: 28 cm From Medial Instep 48.5 cm 45 cm Ankle Left: Right: Point of Measurement: 9 cm From Medial Instep 25.5 cm 24.4 cm Vascular Assessment Pulses: Dorsalis Pedis Palpable: [Left:No] [Right:No] Doppler Audible: [Left:Yes] [Right:Yes] Posterior Tibial Extremity colors, hair growth, and conditions: Extremity Color: [Left:Red] [Right:Red] Temperature of Extremity: [Left:Warm] [Right:Warm] Capillary Refill: [Left:< 3 seconds] [Right:< 3 seconds] Toe Nail Assessment Left: Right: Thick: Yes Yes Discolored: Yes Yes Deformed: No No Improper Length and Hygiene: Yes Yes Electronic Signature(s) Signed:  11/01/2016 4:35:14 PM By: [name] Entered By: [name] on 10/30/2016 12:58:55 [name] ([id number]) -------------------------------------------------------------------------------- Multi Wound Chart Details Patient Name: Jillian Albe B. Date of Service: 10/30/2016 12:30 PM Medical Record Number: [id number] Patient Account Number: [id number] Date of Birth/Sex: 18-Apr-1945 (71 y.o. Female) Treating RN: [name] Primary Care Felma Pfefferle: [name] Other Clinician: Referring Lex Linhares: [name] Treating Darcella Shiffman/Extender: [name] in Treatment: 4 Vital Signs Height(in): 64 Pulse(bpm): 54 Weight(lbs): 206 Blood Pressure(mmHg): 154/74 Body Mass Index(BMI): 35  Temperature(F): 97.6 Respiratory Rate 16 (breaths/min): Photos: [1:No Photos] [2:No Photos] [3:No Photos] Wound Location: [1:Left Lower Leg - Lateral] [2:Left Lower Leg - Anterior] [3:Left Lower Leg - Medial] Wounding Event: [1:Gradually Appeared] [2:Gradually Appeared] [3:Gradually Appeared] Primary Etiology: [1: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] [2:Lower Extremity] [3:Lower Extremity] Comorbid History: [1:Asthma, Congestive Heart Failure, Coronary Artery Disease, Hypertension, Type II Disease, Hypertension, Type II Disease, Hypertension, Type II Diabetes, Osteoarthritis] [2:Asthma, Congestive Heart Failure, Coronary Artery Diabetes,  Osteoarthritis] [3:Asthma, Congestive Heart Failure, Coronary Artery Diabetes, Osteoarthritis] Date Acquired: [1:08/02/2016] [2:08/02/2016] [3:08/02/2016] Weeks of Treatment: [1:4] [2:4] [3:4] Wound Status: [1:Open] [2:Open] [3:Open] Measurements L x W x D [1:6x5x0.2] [2:8.5x3.5x0.1] [3:2.9x2x0.1] (cm) Area (cm) : [1:23.562] [2:23.366] [3:4.555] Volume (cm) : [1:4.712] [2:2.337] [3:0.456] % Reduction in Area: [1:-36.40%] [2:-197.50%] [3:17.20%] % Reduction in Volume: [1:-172.70%]  [2:-197.70%] [3:17.10%] Classification: [1:Grade 1] [2:Grade 1] [3:Grade 1] Exudate Amount: [1:Large] [2:Large] [3:Large] Exudate Type: [1:Serosanguineous] [2:Serous] [3:Serous] Exudate Color: [1:red, brown] [2:amber] [3:amber] Foul Odor After Cleansing: [1:Yes] [2:No] [3:No] Odor Anticipated Due to [1:No] [2:N/A] [3:N/A] Product Use: Wound Margin: [1:Distinct, outline attached] [2:Distinct, outline attached] [3:Distinct, outline attached] Granulation Amount: [1:None Present (0%)] [2:None Present (0%)] [3:None Present (0%)] Granulation Quality: [1:N/A] [2:N/A] [3:N/A] Necrotic Amount: [1:Large (67-100%)] [2:Large (67-100%)] [3:Large (67-100%)] Necrotic Tissue: [1:Eschar, Adherent Slough] [2:Eschar, Adherent Slough] [3:Eschar, Adherent Slough] Epithelialization: [1:None] [2:None] [3:None] Periwound Skin Texture: [1:No Abnormalities Noted] [2:No Abnormalities Noted] [3:No Abnormalities Noted] Periwound Skin Moisture: [1:Maceration: Yes] [2:Maceration: Yes] [3:Maceration: Yes] Periwound Skin Color: [1:Erythema: Yes] [2:Erythema: Yes] [3:Erythema: Yes] Erythema Location: [1:Circumferential] [2:Circumferential] [3:Circumferential] Temperature: [1:No Abnormality] [2:No Abnormality] [3:No Abnormality] Tenderness on Palpation: [1:No] [2:Yes] [3:Yes] Wound Preparation: Ulcer Cleansing: Ulcer Cleansing: Ulcer Cleansing: Rinsed/Irrigated with Saline, Rinsed/Irrigated with Saline, Rinsed/Irrigated with Saline, Other: soap and water Other: soap and water Other: soap and water Topical Anesthetic Applied: Topical Anesthetic Applied: Topical Anesthetic Applied: Other: lidocaine 4% Other: lidocaine 4% Other: lidocaine 4% Wound Number: 6 7 N/A Photos: No Photos No Photos N/A Wound Location: Right Lower Leg - Lateral Right Lower Leg - Medial N/A Wounding Event: Gradually Appeared Gradually Appeared N/A Primary Etiology: Diabetic Wound/Ulcer of the Lymphedema N/A Lower Extremity Comorbid  History: Asthma, Congestive Heart Asthma, Congestive Heart N/A Failure, Coronary Artery Failure, Coronary Artery Disease, Hypertension, Type II Disease, Hypertension, Type II Diabetes, Osteoarthritis Diabetes, Osteoarthritis Date Acquired: 08/02/2016 10/23/2016 N/A Weeks of Treatment: 4 1 N/A Wound Status: Open Open N/A Measurements L x W x D 0.1x0.1x0.1 0.2x0.2x0.1 N/A (cm) Area (cm) : 0.008 0.031 N/A Volume (cm) : 0.001 0.003 N/A % Reduction in Area: 99.90% 0.00% N/A % Reduction in Volume: 99.80% 0.00% N/A Classification: Grade 1 Partial Thickness N/A Exudate Amount: Large Large N/A Exudate Type: Serous Serous N/A Exudate Color: amber amber N/A Foul Odor After Cleansing: No No N/A Odor Anticipated Due to N/A N/A N/A Product Use: Wound Margin: Distinct, outline attached Distinct, outline attached N/A Granulation Amount: Large (67-100%) Large (67-100%) N/A Granulation Quality: Red Red N/A Necrotic Amount: None Present (0%) None Present (0%) N/A Necrotic Tissue: N/A N/A N/A Epithelialization: Large (67-100%) Medium (34-66%) N/A Periwound Skin Texture: No Abnormalities Noted Excoriation: No N/A Induration: No Callus: No Crepitus: No Rash: No Scarring: No Periwound Skin Moisture: Maceration: Yes Maceration: Yes N/A Dry/Scaly: No Periwound Skin Color: Erythema: Yes Atrophie Blanche: No N/A Cyanosis: No Ecchymosis: No Erythema: No Hemosiderin Staining: No Mottled: No Pallor: No Rubor: No Erythema Location: Circumferential N/A N/A Temperature: No Abnormality No Abnormality N/A Lege, Shaniqwa B. (  [id number]) Tenderness on Palpation: Yes Yes N/A Wound Preparation: Ulcer Cleansing: Ulcer Cleansing: N/A Rinsed/Irrigated with Saline, Rinsed/Irrigated with Saline, Other: soap and water Other: soap and water Topical Anesthetic Applied: Topical Anesthetic Applied: None None Treatment Notes Electronic Signature(s) Signed: 10/30/2016 1:10:12 PM By: Christin Fudge MD,  FACS Entered By: Christin Fudge on 10/30/2016 13:10:12 [name] ([id number]) -------------------------------------------------------------------------------- [place] Details Patient Name: HYDEIA, MCATEE B. Date of Service: 10/30/2016 12:30 PM Medical Record Number: [id number] Patient Account Number: [id number] Date of Birth/Sex: [birth year] (71 y.o. Female) Treating RN: [name] Primary Care Hedda Crumbley: [name] Other Clinician: Referring Tip Atienza: [name] Treating Davarius Ridener/Extender: [name] in Treatment: 4 Active Inactive ` Abuse / Safety / Falls / Self Care Management Nursing Diagnoses: History of Falls Potential for falls Goals: Patient will not experience any injury related to falls Date Initiated: 10/02/2016 Target Resolution Date: 01/06/2017 Goal Status: Active Interventions: Assess: immobility, friction, shearing, incontinence upon admission and as needed Assess impairment of mobility on admission and as needed per policy Assess personal safety and home safety (as indicated) on admission and as needed Notes: ` Nutrition Nursing Diagnoses: Imbalanced nutrition Impaired glucose control: actual or potential Potential for alteratiion in Nutrition/Potential for imbalanced nutrition Goals: Patient/caregiver agrees to and verbalizes understanding of need to use nutritional supplements and/or vitamins as prescribed Date Initiated: 10/02/2016 Target Resolution Date: 02/10/2017 Goal Status: Active Patient/caregiver will maintain therapeutic glucose control Date Initiated: 10/02/2016 Target Resolution Date: 01/06/2017 Goal Status: Active Interventions: Assess patient nutrition upon admission and as needed per policy Provide education on elevated blood sugars and impact on wound healing Provide education on nutrition Treatment Activities: Education provided on Nutrition : 10/02/2016 [name], [name]  ([id number]) Notes: ` Orientation to the Wound Care Program Nursing Diagnoses: Knowledge deficit related to the wound healing center program Goals: Patient/caregiver will verbalize understanding of the [place] Date Initiated: 10/02/2016 Target Resolution Date: 11/11/2016 Goal Status: Active Interventions: Provide education on orientation to the wound center Notes: ` Pain, Acute or Chronic Nursing Diagnoses: Pain, acute or chronic: actual or potential Potential alteration in comfort, pain Goals: Patient/caregiver will verbalize adequate pain control between visits Date Initiated: 10/02/2016 Target Resolution Date: 02/10/2017 Goal Status: Active Interventions: Complete pain assessment as per visit requirements Notes: ` Wound/Skin Impairment Nursing Diagnoses: Impaired tissue integrity Knowledge deficit related to ulceration/compromised skin integrity Goals: Ulcer/skin breakdown will have a volume reduction of 80% by week 12 Date Initiated: 10/02/2016 Target Resolution Date: 02/03/2017 Goal Status: Active Interventions: Assess patient/caregiver ability to perform ulcer/skin care regimen upon admission and as needed Assess ulceration(s) every visit Notes: [name], [name] ([id number]) Electronic Signature(s) Signed: 11/01/2016 4:35:14 PM By: [name] Entered By: [name] on 10/30/2016 13:00:02 [name] ([id number]) -------------------------------------------------------------------------------- Pain Assessment Details Patient Name: Jillian Albe B. Date of Service: 10/30/2016 12:30 PM Medical Record Number: [id number] Patient Account Number: [id number] Date of Birth/Sex: 08/17/1945 (71 y.o. Female) Treating RN: [name] Primary Care Melitza Metheny: [name] Other Clinician: Referring Isa Hitz: [name] Treating Kaydence Menard/Extender: [name] in Treatment: 4 Active Problems Location of Pain Severity and  Description of Pain Patient Has Paino Yes Site Locations Pain Location: Generalized Pain Rate the pain. Current Pain Level: 5 Character of Pain Describe the Pain: Aching, Burning Pain Management and Medication Current Pain Management: Electronic Signature(s) Signed: 11/01/2016 4:35:14 PM By: [name] Entered By: [name] on 10/30/2016 12:39:41 [name] ([id number]) -------------------------------------------------------------------------------- Patient/Caregiver Education Details Patient Name: [name]. Date of Service: 10/30/2016  12:30 PM Medical Record Number: [id number] Patient Account Number: [id number] Date of Birth/Gender: [birth year] (71 y.o. Female) Treating RN: [name] Primary Care Physician: [name] Other Clinician: Referring Physician: Lelon Watson Treating Physician/Extender: [name] in Treatment: 4 Education Assessment Education Provided To: Patient Education Topics Provided Wound/Skin Impairment: Handouts: Other: do not get wraps wet Methods: Demonstration, Explain/Verbal Responses: State content correctly Electronic Signature(s) Signed: 11/01/2016 4:35:14 PM By: [name] Entered By: [name] on 10/30/2016 16:36:07 [name] ([id number]) -------------------------------------------------------------------------------- Wound Assessment Details Patient Name: Jillian Albe B. Date of Service: 10/30/2016 12:30 PM Medical Record Number: [id number] Patient Account Number: [id number] Date of Birth/Sex: 1945-09-18 (71 y.o. Female) Treating RN: [name] Primary Care Stefani Baik: [name] Other Clinician: Referring Syris Brookens: [name] Treating Jaydalee Bardwell/Extender: [name] in Treatment: 4 Wound Status Wound Number: 1 Primary Diabetic Wound/Ulcer of the Lower Extremity Etiology: Wound Location: Left Lower Leg - Lateral Wound Open Wounding Event: Gradually  Appeared Status: Date Acquired: 08/02/2016 Comorbid Asthma, Congestive Heart Failure, Coronary Weeks Of Treatment: 4 History: Artery Disease, Hypertension, Type II Diabetes, Clustered Wound: No Osteoarthritis Photos Photo Uploaded By: [name] on 10/30/2016 17:48:37 Wound Measurements Length: (cm) 6 Width: (cm) 5 Depth: (cm) 0.2 Area: (cm) 23.562 Volume: (cm) 4.712 % Reduction in Area: -36.4% % Reduction in Volume: -172.7% Epithelialization: None Tunneling: No Undermining: No Wound Description Classification: Grade 1 Wound Margin: Distinct, outline attached Exudate Amount: Large Exudate Type: Serosanguineous Exudate Color: red, brown Foul Odor After Cleansing: Yes Due to Product Use: No Slough/Fibrino Yes Wound Bed Granulation Amount: None Present (0%) Necrotic Amount: Large (67-100%) Necrotic Quality: Eschar, Adherent Slough Periwound Skin Texture Texture Color No Abnormalities Noted: No No Abnormalities Noted: No Erythema: Yes Moisture Erythema Location: Circumferential No Abnormalities Noted: No [name], Jillian B. ([id number]) Maceration: Yes Temperature / Pain Temperature: No Abnormality Wound Preparation Ulcer Cleansing: Rinsed/Irrigated with Saline, Other: soap and water, Topical Anesthetic Applied: Other: lidocaine 4%, Treatment Notes Wound #1 (Left, Lateral Lower Leg) 1. Cleansed with: Clean wound with Normal Saline Cleanse wound with antibacterial soap and water 2. Anesthetic Topical Lidocaine 4% cream to wound bed prior to debridement 3. Peri-wound Care: Barrier cream 4. Dressing Applied: Santyl Ointment 5. Secondary Dressing Applied ABD Pad Dry Gauze 7. Secured with Tape Notes kerlix, coban, unna to anchor, drawtex Electronic Signature(s) Signed: 11/01/2016 4:35:14 PM By: [name] Entered By: [name] on 10/30/2016 12:54:32 [name]  ([id number]) -------------------------------------------------------------------------------- Wound Assessment Details Patient Name: MARLY, SCHULD B. Date of Service: 10/30/2016 12:30 PM Medical Record Number: [id number] Patient Account Number: [id number] Date of Birth/Sex: [birth year] (71 y.o. Female) Treating RN: Carolyne Fiscal, Debi Primary Care [place] Steidle: [name] Other Clinician: Referring Jupiter Kabir: [name] Treating Cheray Pardi/Extender: [name] in Treatment: 4 Wound Status Wound Number: 2 Primary Diabetic Wound/Ulcer of the Lower Extremity Etiology: Wound Location: Left Lower Leg - Anterior Wound Open Wounding Event: Gradually Appeared Status: Date Acquired: 08/02/2016 Comorbid Asthma, Congestive Heart Failure, Coronary Weeks Of Treatment: 4 History: Artery Disease, Hypertension, Type II Diabetes, Clustered Wound: No Osteoarthritis Photos Photo Uploaded By: [name] on 10/30/2016 17:48:38 Wound Measurements Length: (cm) 8.5 Width: (cm) 3.5 Depth: (cm) 0.1 Area: (cm) 23.366 Volume: (cm) 2.337 % Reduction in Area: -197.5% % Reduction in Volume: -197.7% Epithelialization: None Tunneling: No Undermining: No Wound Description Classification: Grade 1 Wound Margin: Distinct, outline attached Exudate Amount: Large Exudate Type: Serous Exudate Color: amber Foul Odor After Cleansing: No Slough/Fibrino Yes Wound Bed Granulation Amount: None Present (0%) Necrotic Amount: Large (67-100%)  Necrotic Quality: Eschar, Adherent Slough Periwound Skin Texture Texture Color No Abnormalities Noted: No No Abnormalities Noted: No Erythema: Yes Moisture Erythema Location: Circumferential No Abnormalities Noted: No [name], [name] B. ([id number]) Maceration: Yes Temperature / Pain Temperature: No Abnormality Tenderness on Palpation: Yes Wound Preparation Ulcer Cleansing: Rinsed/Irrigated with Saline, Other: soap and water, Topical Anesthetic  Applied: Other: lidocaine 4%, Treatment Notes Wound #2 (Left, Anterior Lower Leg) 1. Cleansed with: Clean wound with Normal Saline Cleanse wound with antibacterial soap and water 2. Anesthetic Topical Lidocaine 4% cream to wound bed prior to debridement 3. Peri-wound Care: Barrier cream 4. Dressing Applied: Other dressing (specify in notes) 5. Secondary Dressing Applied ABD Pad Dry Gauze 7. Secured with Tape Notes kerlix, coban, unna to anchor, drawtex Electronic Signature(s) Signed: 11/01/2016 4:35:14 PM By: [name] Entered By: [name] on 10/30/2016 12:55:21 [name] ([id number]) -------------------------------------------------------------------------------- Wound Assessment Details Patient Name: [name], Jillian B. Date of Service: 10/30/2016 12:30 PM Medical Record Number: [id number] Patient Account Number: [id number] Date of Birth/Sex: [birth year] (71 y.o. Female) Treating RN: Carolyne Fiscal, Debi Primary Care Samil Mecham: [name] Other Clinician: Referring Anjelo Pullman: [name] Treating Rodnisha Blomgren/Extender: [name] in Treatment: 4 Wound Status Wound Number: 3 Primary Diabetic Wound/Ulcer of the Lower Extremity Etiology: Wound Location: Left Lower Leg - Medial Wound Open Wounding Event: Gradually Appeared Status: Date Acquired: 08/02/2016 Comorbid Asthma, Congestive Heart Failure, Coronary Weeks Of Treatment: 4 History: Artery Disease, Hypertension, Type II Diabetes, Clustered Wound: No Osteoarthritis Photos Photo Uploaded By: [name] on 10/30/2016 17:50:14 Wound Measurements Length: (cm) 2.9 Width: (cm) 2 Depth: (cm) 0.1 Area: (cm) 4.555 Volume: (cm) 0.456 % Reduction in Area: 17.2% % Reduction in Volume: 17.1% Epithelialization: None Tunneling: No Undermining: No Wound Description Classification: Grade 1 Wound Margin: Distinct, outline attached Exudate Amount: Large Exudate Type: Serous Exudate Color:  amber Foul Odor After Cleansing: No Slough/Fibrino Yes Wound Bed Granulation Amount: None Present (0%) Necrotic Amount: Large (67-100%) Necrotic Quality: Eschar, Adherent Slough Periwound Skin Texture Texture Color No Abnormalities Noted: No No Abnormalities Noted: No Erythema: Yes Moisture Erythema Location: Circumferential No Abnormalities Noted: No [name], Jillian B. ([id number]) Maceration: Yes Temperature / Pain Temperature: No Abnormality Tenderness on Palpation: Yes Wound Preparation Ulcer Cleansing: Rinsed/Irrigated with Saline, Other: soap and water, Topical Anesthetic Applied: Other: lidocaine 4%, Treatment Notes Wound #3 (Left, Medial Lower Leg) 1. Cleansed with: Clean wound with Normal Saline Cleanse wound with antibacterial soap and water 2. Anesthetic Topical Lidocaine 4% cream to wound bed prior to debridement 3. Peri-wound Care: Barrier cream 4. Dressing Applied: Santyl Ointment 5. Secondary Dressing Applied ABD Pad Dry Gauze 7. Secured with Tape Notes kerlix, coban, unna to anchor, drawtex Electronic Signature(s) Signed: 11/01/2016 4:35:14 PM By: [name] Entered By: [name] on 10/30/2016 12:57:57 [name] ([id number]) -------------------------------------------------------------------------------- Wound Assessment Details Patient Name: SHERI, GATCHEL B. Date of Service: 10/30/2016 12:30 PM Medical Record Number: [id number] Patient Account Number: [id number] Date of Birth/Sex: [birth year] (71 y.o. Female) Treating RN: [name] Primary Care Sayaka Hoeppner: [name] Other Clinician: Referring Delbert Vu: [name] Treating Jonella Redditt/Extender: [name] in Treatment: 4 Wound Status Wound Number: 6 Primary Diabetic Wound/Ulcer of the Lower Extremity Etiology: Wound Location: Right Lower Leg - Lateral Wound Open Wounding Event: Gradually Appeared Status: Date Acquired: 08/02/2016 Comorbid Asthma, Congestive  Heart Failure, Coronary Weeks Of Treatment: 4 History: Artery Disease, Hypertension, Type II Diabetes, Clustered Wound: No Osteoarthritis Photos Photo Uploaded By: [name] on 10/30/2016 17:50:14 Wound Measurements Length: (cm) 0.1 Width: (cm) 0.1  Depth: (cm) 0.1 Area: (cm) 0.008 Volume: (cm) 0.001 % Reduction in Area: 99.9% % Reduction in Volume: 99.8% Epithelialization: Large (67-100%) Tunneling: No Undermining: No Wound Description Classification: Grade 1 Wound Margin: Distinct, outline attached Exudate Amount: Large Exudate Type: Serous Exudate Color: amber Foul Odor After Cleansing: No Slough/Fibrino No Wound Bed Granulation Amount: Large (67-100%) Granulation Quality: Red Necrotic Amount: None Present (0%) Periwound Skin Texture Texture Color No Abnormalities Noted: No No Abnormalities Noted: No Erythema: Yes Moisture Erythema Location: Circumferential No Abnormalities Noted: No RENETTE, HSU B. ([id number]) Maceration: Yes Temperature / Pain Temperature: No Abnormality Tenderness on Palpation: Yes Wound Preparation Ulcer Cleansing: Rinsed/Irrigated with Saline, Other: soap and water, Topical Anesthetic Applied: None Treatment Notes Wound #6 (Right, Lateral Lower Leg) 1. Cleansed with: Clean wound with Normal Saline Cleanse wound with antibacterial soap and water 2. Anesthetic Topical Lidocaine 4% cream to wound bed prior to debridement 3. Peri-wound Care: Barrier cream 4. Dressing Applied: Other dressing (specify in notes) 5. Secondary Dressing Applied ABD Pad Dry Gauze 7. Secured with Tape Notes kerlix, coban, unna to anchor, drawtex Electronic Signature(s) Signed: 11/01/2016 4:35:14 PM By: [name] Entered By: [name] on 10/30/2016 12:53:54 [name] ([id number]) -------------------------------------------------------------------------------- Wound Assessment Details Patient Name: RAIYA, STAINBACK B. Date of  Service: 10/30/2016 12:30 PM Medical Record Number: [id number] Patient Account Number: [id number] Date of Birth/Sex: June 14, 1945 (71 y.o. Female) Treating RN: Carolyne Fiscal, Debi Primary Care Digna Countess: [name] Other Clinician: Referring Malaiyah Achorn: [name] Treating Chaneka Trefz/Extender: [name] in Treatment: 4 Wound Status Wound Number: 7 Primary Lymphedema Etiology: Wound Location: Right Lower Leg - Medial Wound Open Wounding Event: Gradually Appeared Status: Date Acquired: 10/23/2016 Comorbid Asthma, Congestive Heart Failure, Coronary Weeks Of Treatment: 1 History: Artery Disease, Hypertension, Type II Diabetes, Clustered Wound: No Osteoarthritis Photos Photo Uploaded By: [name] on 10/30/2016 17:50:46 Wound Measurements Length: (cm) 0.2 Width: (cm) 0.2 Depth: (cm) 0.1 Area: (cm) 0.031 Volume: (cm) 0.003 % Reduction in Area: 0% % Reduction in Volume: 0% Epithelialization: Medium (34-66%) Tunneling: No Undermining: No Wound Description Classification: Partial Thickness Wound Margin: Distinct, outline attached Exudate Amount: Large Exudate Type: Serous Exudate Color: amber Foul Odor After Cleansing: No Slough/Fibrino No Wound Bed Granulation Amount: Large (67-100%) Exposed Structure Granulation Quality: Red Fascia Exposed: No Necrotic Amount: None Present (0%) Fat Layer (Subcutaneous Tissue) Exposed: No Tendon Exposed: No Muscle Exposed: No Joint Exposed: No Bone Exposed: No Periwound Skin Texture Strom, Hanifa B. ([id number]) Texture Color No Abnormalities Noted: No No Abnormalities Noted: No Callus: No Atrophie Blanche: No Crepitus: No Cyanosis: No Excoriation: No Ecchymosis: No Induration: No Erythema: No Rash: No Hemosiderin Staining: No Scarring: No Mottled: No Pallor: No Moisture Rubor: No No Abnormalities Noted: No Dry / Scaly: No Temperature / Pain Maceration: Yes Temperature: No Abnormality Tenderness on  Palpation: Yes Wound Preparation Ulcer Cleansing: Rinsed/Irrigated with Saline, Other: soap and water, Topical Anesthetic Applied: None Treatment Notes Wound #7 (Right, Medial Lower Leg) 1. Cleansed with: Clean wound with Normal Saline Cleanse wound with antibacterial soap and water 2. Anesthetic Topical Lidocaine 4% cream to wound bed prior to debridement 3. Peri-wound Care: Barrier cream 4. Dressing Applied: Other dressing (specify in notes) 5. Secondary Dressing Applied ABD Pad Dry Gauze 7. Secured with Tape Notes kerlix, coban, unna to anchor, drawtex Electronic Signature(s) Signed: 11/01/2016 4:35:14 PM By: [name] Entered By: [name] on 10/30/2016 12:52:56 [name] ([id number]) -------------------------------------------------------------------------------- Vitals Details Patient Name: Jillian Albe B. Date of Service: 10/30/2016 12:30 PM Medical Record  Number: [id number] Patient Account Number: [id number] Date of Birth/Sex: [birth year] (71 y.o. Female) Treating RN: Carolyne Fiscal, Debi Primary Care Onica Davidovich: [name] Other Clinician: Referring Teola Felipe: [name] Treating Jasmina Gendron/Extender: [name] in Treatment: 4 Vital Signs Time Taken: 12:45 Temperature (F): 97.6 Height (in): 64 Pulse (bpm): 66 Weight (lbs): 206 Respiratory Rate (breaths/min): 16 Body Mass Index (BMI): 35.4 Blood Pressure (mmHg): 154/74 Reference Range: 80 - 120 mg / dl Electronic Signature(s) Signed: 11/01/2016 4:35:14 PM By: [name] Entered By: [name] on 10/30/2016 12:45:10

## 2016-11-06 ENCOUNTER — Encounter: Payer: Medicare Other | Attending: Surgery | Admitting: Surgery

## 2016-11-06 DIAGNOSIS — Z7984 Long term (current) use of oral hypoglycemic drugs: Secondary | ICD-10-CM | POA: Insufficient documentation

## 2016-11-06 DIAGNOSIS — I429 Cardiomyopathy, unspecified: Secondary | ICD-10-CM | POA: Insufficient documentation

## 2016-11-06 DIAGNOSIS — L97822 Non-pressure chronic ulcer of other part of left lower leg with fat layer exposed: Secondary | ICD-10-CM | POA: Diagnosis not present

## 2016-11-06 DIAGNOSIS — L97222 Non-pressure chronic ulcer of left calf with fat layer exposed: Secondary | ICD-10-CM | POA: Insufficient documentation

## 2016-11-06 DIAGNOSIS — J45909 Unspecified asthma, uncomplicated: Secondary | ICD-10-CM | POA: Diagnosis not present

## 2016-11-06 DIAGNOSIS — M1990 Unspecified osteoarthritis, unspecified site: Secondary | ICD-10-CM | POA: Diagnosis not present

## 2016-11-06 DIAGNOSIS — I272 Pulmonary hypertension, unspecified: Secondary | ICD-10-CM | POA: Diagnosis not present

## 2016-11-06 DIAGNOSIS — I890 Lymphedema, not elsewhere classified: Secondary | ICD-10-CM | POA: Diagnosis not present

## 2016-11-06 DIAGNOSIS — I110 Hypertensive heart disease with heart failure: Secondary | ICD-10-CM | POA: Diagnosis not present

## 2016-11-06 DIAGNOSIS — Z79899 Other long term (current) drug therapy: Secondary | ICD-10-CM | POA: Diagnosis not present

## 2016-11-06 DIAGNOSIS — E11622 Type 2 diabetes mellitus with other skin ulcer: Secondary | ICD-10-CM | POA: Insufficient documentation

## 2016-11-06 DIAGNOSIS — Z880 Allergy status to penicillin: Secondary | ICD-10-CM | POA: Diagnosis not present

## 2016-11-06 DIAGNOSIS — L97212 Non-pressure chronic ulcer of right calf with fat layer exposed: Secondary | ICD-10-CM | POA: Diagnosis not present

## 2016-11-06 DIAGNOSIS — I2510 Atherosclerotic heart disease of native coronary artery without angina pectoris: Secondary | ICD-10-CM | POA: Diagnosis not present

## 2016-11-06 DIAGNOSIS — I509 Heart failure, unspecified: Secondary | ICD-10-CM | POA: Diagnosis not present

## 2016-11-06 DIAGNOSIS — L97229 Non-pressure chronic ulcer of left calf with unspecified severity: Secondary | ICD-10-CM | POA: Diagnosis not present

## 2016-11-07 NOTE — Progress Notes (Signed)
TIMBER, MARSHMAN ([id number]) Visit Report for 11/06/2016 Arrival Information Details Patient Name: [name], [name]. Date of Service: 11/06/2016 8:00 AM Medical Record Number: [id number] Patient Account Number: [id number] Date of Birth/Sex: [birth year] (71 y.o. Female) Treating RN: Ahmed Prima Primary Care Mariko Nowakowski: Lelon Huh Other Clinician: Referring Auren Valdes: Lelon Huh Treating Danissa Rundle/Extender: Frann Rider in Treatment: 5 Visit Information History Since Last Visit All ordered tests and consults were completed: No Patient Arrived: Wheel Chair Added or deleted any medications: No Arrival Time: 08:09 Any new allergies or adverse reactions: No Accompanied By: husband Had a fall or experienced change in No Transfer Assistance: EasyPivot Patient activities of daily living that may affect Lift risk of falls: Patient Identification Verified: Yes Signs or symptoms of abuse/neglect since last visito No Secondary Verification Process Yes Hospitalized since last visit: No Completed: Has Dressing in Place as Prescribed: Yes Patient Requires Transmission-Based No Precautions: Has Compression in Place as Prescribed: Yes Patient Has Alerts: Yes Pain Present Now: Yes Electronic Signature(s) Signed: 11/06/2016 4:55:44 PM By: Alric Quan Entered By: Alric Quan on 11/06/2016 08:13:05 [name] ([id number]) -------------------------------------------------------------------------------- Clinic Level of Care Assessment Details Patient Name: [name] Date of Service: 11/06/2016 8:00 AM Medical Record Number: [id number] Patient Account Number: [id number] Date of Birth/Sex: 17-Mar-1945 (71 y.o. Female) Treating RN: Carolyne Fiscal, Debi Primary Care Laiden Milles: Lelon Huh Other Clinician: Referring Keean Wilmeth: Lelon Huh Treating Dontrey Snellgrove/Extender: Frann Rider in Treatment: 5 Clinic Level of Care Assessment Items TOOL 4 Quantity Score X - Use when  only an EandM is performed on FOLLOW-UP visit 1 0 ASSESSMENTS - Nursing Assessment / Reassessment X - Reassessment of Co-morbidities (includes updates in patient status) 1 10 X- 1 5 Reassessment of Adherence to Treatment Plan ASSESSMENTS - Wound and Skin Assessment / Reassessment []  - Simple Wound Assessment / Reassessment - one wound 0 X- 4 5 Complex Wound Assessment / Reassessment - multiple wounds []  - 0 Dermatologic / Skin Assessment (not related to wound area) ASSESSMENTS - Focused Assessment []  - Circumferential Edema Measurements - multi extremities 0 []  - 0 Nutritional Assessment / Counseling / Intervention []  - 0 Lower Extremity Assessment (monofilament, tuning fork, pulses) []  - 0 Peripheral Arterial Disease Assessment (using hand held doppler) ASSESSMENTS - Ostomy and/or Continence Assessment and Care []  - Incontinence Assessment and Management 0 []  - 0 Ostomy Care Assessment and Management (repouching, etc.) PROCESS - Coordination of Care []  - Simple Patient / Family Education for ongoing care 0 X- 1 20 Complex (extensive) Patient / Family Education for ongoing care X- 1 10 Staff obtains Programmer, systems, Records, Test Results / Process Orders X- 1 10 Staff telephones HHA, Nursing Homes / Clarify orders / etc X- 1 10 Routine Transfer to another Facility (non-emergent condition) []  - 0 Routine Hospital Admission (non-emergent condition) []  - 0 New Admissions / Biomedical engineer / Ordering NPWT, Apligraf, etc. []  - 0 Emergency Hospital Admission (emergent condition) X- 1 10 Simple Discharge Coordination TORUNN, CHANCELLOR ([id number]) []  - 0 Complex (extensive) Discharge Coordination PROCESS - Special Needs []  - Pediatric / Minor Patient Management 0 []  - 0 Isolation Patient Management []  - 0 Hearing / Language / Visual special needs []  - 0 Assessment of Community assistance (transportation, D/C planning, etc.) []  - 0 Additional assistance / Altered  mentation []  - 0 Support Surface(s) Assessment (bed, cushion, seat, etc.) INTERVENTIONS - Wound Cleansing / Measurement []  - Simple Wound Cleansing - one wound 0 X- 4 5 Complex Wound Cleansing - multiple wounds X-  1 5 Wound Imaging (photographs - any number of wounds) []  - 0 Wound Tracing (instead of photographs) []  - 0 Simple Wound Measurement - one wound X- 4 5 Complex Wound Measurement - multiple wounds INTERVENTIONS - Wound Dressings []  - Small Wound Dressing one or multiple wounds 0 []  - 0 Medium Wound Dressing one or multiple wounds X- 2 20 Large Wound Dressing one or multiple wounds []  - 0 Application of Medications - topical X- 1 10 Application of Medications - injection INTERVENTIONS - Miscellaneous []  - External ear exam 0 []  - 0 Specimen Collection (cultures, biopsies, blood, body fluids, etc.) []  - 0 Specimen(s) / Culture(s) sent or taken to Lab for analysis []  - 0 Patient Transfer (multiple staff / Civil Service fast streamer / Similar devices) []  - 0 Simple Staple / Suture removal (25 or less) []  - 0 Complex Staple / Suture removal (26 or more) []  - 0 Hypo / Hyperglycemic Management (close monitor of Blood Glucose) []  - 0 Ankle / Brachial Index (ABI) - do not check if billed separately X- 1 5 Vital Signs Upshaw, Verdene B. ([id number]) Has the patient been seen at the hospital within the last three years: Yes Total Score: 195 Level Of Care: New/Established - Level 5 Electronic Signature(s) Signed: 11/06/2016 4:55:44 PM By: Alric Quan Entered By: Alric Quan on 11/06/2016 09:54:08 [name] ([id number]) -------------------------------------------------------------------------------- Encounter Discharge Information Details Patient Name: Jillian Albe B. Date of Service: 11/06/2016 8:00 AM Medical Record Number: [id number] Patient Account Number: [id number] Date of Birth/Sex: [birth year] (71 y.o. Female) Treating RN: Ahmed Prima Primary Care Inge Waldroup:  Lelon Huh Other Clinician: Referring Zamir Staples: Lelon Huh Treating Bryahna Lesko/Extender: Frann Rider in Treatment: 5 Encounter Discharge Information Items Discharge Pain Level: 0 Discharge Condition: Stable Ambulatory Status: Wheelchair Discharge Destination: Home Transportation: Private Auto Accompanied By: husband Schedule Follow-up Appointment: Yes Medication Reconciliation completed and No provided to Patient/Care Coleston Dirosa: Provided on Clinical Summary of Care: 11/06/2016 Form Type Recipient Paper Patient CS Electronic Signature(s) Signed: 11/06/2016 9:53:04 AM By: Alric Quan Entered By: Alric Quan on 11/06/2016 09:53:03 [name] ([id number]) -------------------------------------------------------------------------------- Lower Extremity Assessment Details Patient Name: Jillian Albe B. Date of Service: 11/06/2016 8:00 AM Medical Record Number: [id number] Patient Account Number: [id number] Date of Birth/Sex: 1945/01/24 (71 y.o. Female) Treating RN: Carolyne Fiscal, Debi Primary Care Holland Nickson: Lelon Huh Other Clinician: Referring Gedalya Jim: Lelon Huh Treating Nasrin Lanzo/Extender: Frann Rider in Treatment: 5 Edema Assessment Assessed: [Left: No] [Right: No] [Left: Edema] [Right: :] Calf Left: Right: Point of Measurement: 28 cm From Medial Instep 46.8 cm 45.8 cm Ankle Left: Right: Point of Measurement: 9 cm From Medial Instep 26 cm 25.2 cm Vascular Assessment Pulses: Dorsalis Pedis Palpable: [Left:No] [Right:No] Doppler Audible: [Left:Yes] [Right:Yes] Posterior Tibial Extremity colors, hair growth, and conditions: Extremity Color: [Left:Red] [Right:Red] Temperature of Extremity: [Left:Warm] [Right:Warm] Capillary Refill: [Left:< 3 seconds] [Right:< 3 seconds] Toe Nail Assessment Left: Right: Thick: Yes Yes Discolored: Yes Yes Deformed: No No Improper Length and Hygiene: Yes Yes Electronic Signature(s) Signed: 11/06/2016  4:55:44 PM By: Alric Quan Entered By: Alric Quan on 11/06/2016 08:33:27 [name] ([id number]) -------------------------------------------------------------------------------- Multi Wound Chart Details Patient Name: Jillian Albe B. Date of Service: 11/06/2016 8:00 AM Medical Record Number: [id number] Patient Account Number: [id number] Date of Birth/Sex: [birth year] (71 y.o. Female) Treating RN: Ahmed Prima Primary Care Monea Pesantez: Lelon Huh Other Clinician: Referring Kristan Brummitt: Lelon Huh Treating Marquise Lambson/Extender: Frann Rider in Treatment: 5 Vital Signs Height(in): 64 Pulse(bpm): 51 Weight(lbs): 206 Blood Pressure(mmHg): 136/57 Body Mass Index(BMI): 35  Temperature(F): 97.9 Respiratory Rate 16 (breaths/min): Photos: [1:No Photos] [2:No Photos] [3:No Photos] Wound Location: [1:Left Lower Leg - Lateral] [2:Left Lower Leg - Anterior] [3:Left Lower Leg - Medial] Wounding Event: [1:Gradually Appeared] [2:Gradually Appeared] [3:Gradually Appeared] Primary Etiology: [1: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] [2:Lower Extremity] [3:Lower Extremity] Comorbid History: [1:Asthma, Congestive Heart Failure, Coronary Artery Disease, Hypertension, Type II Disease, Hypertension, Type II Disease, Hypertension, Type II Diabetes, Osteoarthritis] [2:Asthma, Congestive Heart Failure, Coronary Artery Diabetes,  Osteoarthritis] [3:Asthma, Congestive Heart Failure, Coronary Artery Diabetes, Osteoarthritis] Date Acquired: [1:08/02/2016] [2:08/02/2016] [3:08/02/2016] Weeks of Treatment: [1:5] [2:5] [3:5] Wound Status: [1:Open] [2:Open] [3:Open] Measurements L x W x D [1:6.5x4.5x0.2] [2:9x7x0.1] [3:2.9x2x0.1] (cm) Area (cm) : [1:22.973] [2:49.48] [3:4.555] Volume (cm) : [1:4.595] [2:4.948] [3:0.456] % Reduction in Area: [1:-33.00%] [2:-530.00%] [3:17.20%] % Reduction in Volume: [1:-165.90%] [2:-530.30%]  [3:17.10%] Classification: [1:Grade 1] [2:Grade 1] [3:Grade 1] Exudate Amount: [1:Large] [2:Large] [3:Large] Exudate Type: [1:Serosanguineous] [2:Serous] [3:Serous] Exudate Color: [1:red, brown] [2:amber] [3:amber] Wound Margin: [1:Distinct, outline attached] [2:Distinct, outline attached] [3:Distinct, outline attached] Granulation Amount: [1:None Present (0%)] [2:Small (1-33%)] [3:None Present (0%)] Granulation Quality: [1:N/A] [2:Pink] [3:N/A] Necrotic Amount: [1:Large (67-100%)] [2:Large (67-100%)] [3:Large (67-100%)] Necrotic Tissue: [1:Eschar, Adherent Slough] [2:Eschar, Adherent Slough] [3:Eschar, Adherent Slough] Epithelialization: [1:None] [2:None] [3:None] Periwound Skin Texture: [1:No Abnormalities Noted] [2:No Abnormalities Noted] [3:No Abnormalities Noted] Periwound Skin Moisture: [1:Maceration: Yes] [2:Maceration: Yes] [3:Maceration: Yes] Periwound Skin Color: [1:Erythema: Yes] [2:Erythema: Yes] [3:Erythema: Yes] Erythema Location: [1:Circumferential] [2:Circumferential] [3:Circumferential] Temperature: [1:No Abnormality] [2:No Abnormality] [3:No Abnormality] Tenderness on Palpation: [1:No] [2:Yes] [3:Yes] Wound Preparation: [1:Ulcer Cleansing: Rinsed/Irrigated with Saline, Other: soap and water] [2:Ulcer Cleansing: Rinsed/Irrigated with Saline, Other: soap and water] [3:Ulcer Cleansing: Rinsed/Irrigated with Saline, Other: soap and water] Topical Anesthetic Applied: Topical Anesthetic Applied: Topical Anesthetic Applied: Other: lidocaine 4% Other: lidocaine 4% Other: lidocaine 4% Procedures Performed: N/A N/A CHEM CAUT GRANULATION TISS Wound Number: 6 [id number] N/A Photos: No Photos No Photos N/A Wound Location: Right Lower Leg - Lateral Right, Medial Lower Leg N/A Wounding Event: Gradually Appeared Gradually Appeared N/A Primary Etiology: Diabetic Wound/Ulcer of the Lymphedema N/A Lower Extremity Comorbid History: Asthma, Congestive Heart N/A N/A Failure, Coronary  Artery Disease, Hypertension, Type II Diabetes, Osteoarthritis Date Acquired: 08/02/2016 10/23/2016 N/A Weeks of Treatment: 5 2 N/A Wound Status: Open Healed - Epithelialized N/A Measurements L x W x D 1x0.7x0.1 0x0x0 N/A (cm) Area (cm) : 0.55 0 N/A Volume (cm) : 0.055 0 N/A % Reduction in Area: 91.20% 100.00% N/A % Reduction in Volume: 91.20% 100.00% N/A Classification: Grade 1 Partial Thickness N/A Exudate Amount: Large N/A N/A Exudate Type: Serous N/A N/A Exudate Color: amber N/A N/A Wound Margin: Distinct, outline attached N/A N/A Granulation Amount: Large (67-100%) N/A N/A Granulation Quality: Red N/A N/A Necrotic Amount: Small (1-33%) N/A N/A Necrotic Tissue: Adherent Slough N/A N/A Epithelialization: Medium (34-66%) N/A N/A Periwound Skin Texture: No Abnormalities Noted No Abnormalities Noted N/A Periwound Skin Moisture: Maceration: Yes No Abnormalities Noted N/A Periwound Skin Color: Erythema: Yes No Abnormalities Noted N/A Erythema Location: Circumferential N/A N/A Temperature: No Abnormality N/A N/A Tenderness on Palpation: Yes No N/A Wound Preparation: Ulcer Cleansing: N/A N/A Rinsed/Irrigated with Saline, Other: soap and water Topical Anesthetic Applied: Other: lidocaine 4% Procedures Performed: N/A N/A N/A Treatment Notes Electronic Signature(s) Signed: 11/06/2016 8:43:16 AM By: Christin Fudge MD, FACS Entered By: Christin Fudge on 11/06/2016 08:43:16 KENESHA, MOSHIER ([id number]) DAIYA, TAMER ([id number]) -------------------------------------------------------------------------------- [place] Details Patient Name: MARYLYNN, RIGDON. Date of  Service: 11/06/2016 8:00 AM Medical Record Number: [id number] Patient Account Number: [id number] Date of Birth/Sex: 1945-05-10 (71 y.o. Female) Treating RN: Ahmed Prima Primary Care Braya Habermehl: Lelon Huh Other Clinician: Referring Peirce Deveney: Lelon Huh Treating Shanina Kepple/Extender: Frann Rider in Treatment: 5 Active Inactive ` Abuse / Safety / Falls / Self Care Management Nursing Diagnoses: History of Falls Potential for falls Goals: Patient will not experience any injury related to falls Date Initiated: 10/02/2016 Target Resolution Date: 01/06/2017 Goal Status: Active Interventions: Assess: immobility, friction, shearing, incontinence upon admission and as needed Assess impairment of mobility on admission and as needed per policy Assess personal safety and home safety (as indicated) on admission and as needed Notes: ` Nutrition Nursing Diagnoses: Imbalanced nutrition Impaired glucose control: actual or potential Potential for alteratiion in Nutrition/Potential for imbalanced nutrition Goals: Patient/caregiver agrees to and verbalizes understanding of need to use nutritional supplements and/or vitamins as prescribed Date Initiated: 10/02/2016 Target Resolution Date: 02/10/2017 Goal Status: Active Patient/caregiver will maintain therapeutic glucose control Date Initiated: 10/02/2016 Target Resolution Date: 01/06/2017 Goal Status: Active Interventions: Assess patient nutrition upon admission and as needed per policy Provide education on elevated blood sugars and impact on wound healing Provide education on nutrition Treatment Activities: Education provided on Nutrition : 10/02/2016 NASTACIA, RAYBUCK ([id number]) Notes: ` Orientation to the Wound Care Program Nursing Diagnoses: Knowledge deficit related to the wound healing center program Goals: Patient/caregiver will verbalize understanding of the [place] Date Initiated: 10/02/2016 Target Resolution Date: 11/11/2016 Goal Status: Active Interventions: Provide education on orientation to the wound center Notes: ` Pain, Acute or Chronic Nursing Diagnoses: Pain, acute or chronic: actual or potential Potential alteration in comfort, pain Goals: Patient/caregiver will verbalize adequate  pain control between visits Date Initiated: 10/02/2016 Target Resolution Date: 02/10/2017 Goal Status: Active Interventions: Complete pain assessment as per visit requirements Notes: ` Wound/Skin Impairment Nursing Diagnoses: Impaired tissue integrity Knowledge deficit related to ulceration/compromised skin integrity Goals: Ulcer/skin breakdown will have a volume reduction of 80% by week 12 Date Initiated: 10/02/2016 Target Resolution Date: 02/03/2017 Goal Status: Active Interventions: Assess patient/caregiver ability to perform ulcer/skin care regimen upon admission and as needed Assess ulceration(s) every visit Notes: NOLAN, LASSER ([id number]) Electronic Signature(s) Signed: 11/06/2016 4:55:44 PM By: Alric Quan Entered By: Alric Quan on 11/06/2016 08:35:22 [name] ([id number]) -------------------------------------------------------------------------------- Pain Assessment Details Patient Name: Jillian Albe B. Date of Service: 11/06/2016 8:00 AM Medical Record Number: [id number] Patient Account Number: [id number] Date of Birth/Sex: [birth year] (71 y.o. Female) Treating RN: Ahmed Prima Primary Care Ren Aspinall: Lelon Huh Other Clinician: Referring Mekia Dipinto: Lelon Huh Treating Calisha Tindel/Extender: Frann Rider in Treatment: 5 Active Problems Location of Pain Severity and Description of Pain Patient Has Paino Yes Site Locations Rate the pain. Current Pain Level: 8 Character of Pain Describe the Pain: Aching, Tender, Throbbing Pain Management and Medication Current Pain Management: Electronic Signature(s) Signed: 11/06/2016 4:55:44 PM By: Alric Quan Entered By: Alric Quan on 11/06/2016 08:13:19 [name] ([id number]) -------------------------------------------------------------------------------- Patient/Caregiver Education Details Patient Name: [name]. Date of Service: 11/06/2016 8:00 AM Medical Record Number:  [id number] Patient Account Number: [id number] Date of Birth/Gender: August 01, 1945 (71 y.o. Female) Treating RN: Ahmed Prima Primary Care Physician: Lelon Huh Other Clinician: Referring Physician: Lelon Huh Treating Physician/Extender: Frann Rider in Treatment: 5 Education Assessment Education Provided To: Patient Education Topics Provided Wound/Skin Impairment: Handouts: Other: do not get wraps wet Methods: Demonstration, Explain/Verbal Responses: State content correctly Electronic Signature(s) Signed: 11/06/2016 4:55:44 PM By: Alric Quan Entered  By: Alric Quan on 11/06/2016 09:53:21 Fogal, Jeanice Lim ([id number]) -------------------------------------------------------------------------------- Wound Assessment Details Patient Name: JAMIE, HAFFORD B. Date of Service: 11/06/2016 8:00 AM Medical Record Number: [id number] Patient Account Number: [id number] Date of Birth/Sex: 09-20-1945 (71 y.o. Female) Treating RN: Carolyne Fiscal, Debi Primary Care Lashana Spang: Lelon Huh Other Clinician: Referring Deirdre Gryder: Lelon Huh Treating Kyshon Tolliver/Extender: Frann Rider in Treatment: 5 Wound Status Wound Number: 1 Primary Diabetic Wound/Ulcer of the Lower Extremity Etiology: Wound Location: Left Lower Leg - Lateral Wound Open Wounding Event: Gradually Appeared Status: Date Acquired: 08/02/2016 Comorbid Asthma, Congestive Heart Failure, Coronary Weeks Of Treatment: 5 History: Artery Disease, Hypertension, Type II Diabetes, Clustered Wound: No Osteoarthritis Photos Photo Uploaded By: Alric Quan on 11/06/2016 16:14:43 Wound Measurements Length: (cm) 6.5 Width: (cm) 4.5 Depth: (cm) 0.2 Area: (cm) 22.973 Volume: (cm) 4.595 % Reduction in Area: -33% % Reduction in Volume: -165.9% Epithelialization: None Tunneling: No Undermining: No Wound Description Classification: Grade 1 Wound Margin: Distinct, outline attached Exudate Amount:  Large Exudate Type: Serosanguineous Exudate Color: red, brown Foul Odor After Cleansing: No Slough/Fibrino Yes Wound Bed Granulation Amount: None Present (0%) Necrotic Amount: Large (67-100%) Necrotic Quality: Eschar, Adherent Slough Periwound Skin Texture Texture Color No Abnormalities Noted: No No Abnormalities Noted: No Erythema: Yes Moisture Erythema Location: Circumferential No Abnormalities Noted: No SHARINA, PETRE B. ([id number]) Maceration: Yes Temperature / Pain Temperature: No Abnormality Wound Preparation Ulcer Cleansing: Rinsed/Irrigated with Saline, Other: soap and water, Topical Anesthetic Applied: Other: lidocaine 4%, Treatment Notes Wound #1 (Left, Lateral Lower Leg) 1. Cleansed with: Clean wound with Normal Saline Cleanse wound with antibacterial soap and water 2. Anesthetic Topical Lidocaine 4% cream to wound bed prior to debridement 3. Peri-wound Care: Barrier cream 4. Dressing Applied: Santyl Ointment 5. Secondary Dressing Applied ABD Pad Dry Gauze 7. Secured with Tape Notes xtrasorb, kerlix, coban, unna to Engineer, production) Signed: 11/06/2016 4:55:44 PM By: Alric Quan Entered By: Alric Quan on 11/06/2016 08:25:36 [name] ([id number]) -------------------------------------------------------------------------------- Wound Assessment Details Patient Name: AMEIA, MORENCY B. Date of Service: 11/06/2016 8:00 AM Medical Record Number: [id number] Patient Account Number: [id number] Date of Birth/Sex: 02-Feb-1945 (71 y.o. Female) Treating RN: Carolyne Fiscal, Debi Primary Care Nijee Heatwole: Lelon Huh Other Clinician: Referring Aracely Rickett: Lelon Huh Treating Mellanie Bejarano/Extender: Frann Rider in Treatment: 5 Wound Status Wound Number: 2 Primary Diabetic Wound/Ulcer of the Lower Extremity Etiology: Wound Location: Left Lower Leg - Anterior Wound Open Wounding Event: Gradually Appeared Status: Date Acquired:  08/02/2016 Comorbid Asthma, Congestive Heart Failure, Coronary Weeks Of Treatment: 5 History: Artery Disease, Hypertension, Type II Diabetes, Clustered Wound: No Osteoarthritis Photos Photo Uploaded By: Alric Quan on 11/06/2016 16:14:44 Wound Measurements Length: (cm) 9 Width: (cm) 7 Depth: (cm) 0.1 Area: (cm) 49.48 Volume: (cm) 4.948 % Reduction in Area: -530% % Reduction in Volume: -530.3% Epithelialization: None Tunneling: No Undermining: No Wound Description Classification: Grade 1 Wound Margin: Distinct, outline attached Exudate Amount: Large Exudate Type: Serous Exudate Color: amber Foul Odor After Cleansing: No Slough/Fibrino Yes Wound Bed Granulation Amount: Small (1-33%) Granulation Quality: Pink Necrotic Amount: Large (67-100%) Necrotic Quality: Eschar, Adherent Slough Periwound Skin Texture Texture Color No Abnormalities Noted: No No Abnormalities Noted: No Erythema: Yes Moisture KIMBERLYN, QUIOCHO B. ([id number]) No Abnormalities Noted: No Erythema Location: Circumferential Maceration: Yes Temperature / Pain Temperature: No Abnormality Tenderness on Palpation: Yes Wound Preparation Ulcer Cleansing: Rinsed/Irrigated with Saline, Other: soap and water, Topical Anesthetic Applied: Other: lidocaine 4%, Treatment Notes Wound #2 (Left, Anterior Lower Leg) 1. Cleansed with: Clean wound with Normal Saline Cleanse  wound with antibacterial soap and water 2. Anesthetic Topical Lidocaine 4% cream to wound bed prior to debridement 3. Peri-wound Care: Barrier cream 4. Dressing Applied: Other dressing (specify in notes) 5. Secondary Dressing Applied ABD Pad 7. Secured with Tape Notes xtrasorb, kerlix, coban, unna to Engineer, production) Signed: 11/06/2016 4:55:44 PM By: Alric Quan Entered By: Alric Quan on 11/06/2016 08:28:36 [name]  ([id number]) -------------------------------------------------------------------------------- Wound Assessment Details Patient Name: TAHJANAE, BLANKENBURG B. Date of Service: 11/06/2016 8:00 AM Medical Record Number: [id number] Patient Account Number: [id number] Date of Birth/Sex: 1945-12-21 (71 y.o. Female) Treating RN: Carolyne Fiscal, Debi Primary Care Nimai Burbach: Lelon Huh Other Clinician: Referring Lilybeth Vien: Lelon Huh Treating Psalm Arman/Extender: Frann Rider in Treatment: 5 Wound Status Wound Number: 3 Primary Diabetic Wound/Ulcer of the Lower Extremity Etiology: Wound Location: Left Lower Leg - Medial Wound Open Wounding Event: Gradually Appeared Status: Date Acquired: 08/02/2016 Comorbid Asthma, Congestive Heart Failure, Coronary Weeks Of Treatment: 5 History: Artery Disease, Hypertension, Type II Diabetes, Clustered Wound: No Osteoarthritis Photos Photo Uploaded By: Alric Quan on 11/06/2016 16:15:25 Wound Measurements Length: (cm) 2.9 Width: (cm) 2 Depth: (cm) 0.1 Area: (cm) 4.555 Volume: (cm) 0.456 % Reduction in Area: 17.2% % Reduction in Volume: 17.1% Epithelialization: None Tunneling: No Undermining: No Wound Description Classification: Grade 1 Wound Margin: Distinct, outline attached Exudate Amount: Large Exudate Type: Serous Exudate Color: amber Foul Odor After Cleansing: No Slough/Fibrino Yes Wound Bed Granulation Amount: None Present (0%) Necrotic Amount: Large (67-100%) Necrotic Quality: Eschar, Adherent Slough Periwound Skin Texture Texture Color No Abnormalities Noted: No No Abnormalities Noted: No Erythema: Yes Moisture Erythema Location: Circumferential No Abnormalities Noted: No JULIUS, BONIFACE B. ([id number]) Maceration: Yes Temperature / Pain Temperature: No Abnormality Tenderness on Palpation: Yes Wound Preparation Ulcer Cleansing: Rinsed/Irrigated with Saline, Other: soap and water, Topical Anesthetic Applied: Other:  lidocaine 4%, Treatment Notes Wound #3 (Left, Medial Lower Leg) 1. Cleansed with: Clean wound with Normal Saline Cleanse wound with antibacterial soap and water 2. Anesthetic Topical Lidocaine 4% cream to wound bed prior to debridement 3. Peri-wound Care: Barrier cream 4. Dressing Applied: Santyl Ointment 5. Secondary Dressing Applied ABD Pad Dry Gauze 7. Secured with Tape Notes xtrasorb, kerlix, coban, unna to Engineer, production) Signed: 11/06/2016 4:55:44 PM By: Alric Quan Entered By: Alric Quan on 11/06/2016 08:30:21 [name] ([id number]) -------------------------------------------------------------------------------- Wound Assessment Details Patient Name: AUTUMNE, KALLIO B. Date of Service: 11/06/2016 8:00 AM Medical Record Number: [id number] Patient Account Number: [id number] Date of Birth/Sex: 21-Jan-1945 (71 y.o. Female) Treating RN: Carolyne Fiscal, Debi Primary Care Achilles Neville: Lelon Huh Other Clinician: Referring Maryon Kemnitz: Lelon Huh Treating Rashana Andrew/Extender: Frann Rider in Treatment: 5 Wound Status Wound Number: 6 Primary Diabetic Wound/Ulcer of the Lower Extremity Etiology: Wound Location: Right Lower Leg - Lateral Wound Open Wounding Event: Gradually Appeared Status: Date Acquired: 08/02/2016 Comorbid Asthma, Congestive Heart Failure, Coronary Weeks Of Treatment: 5 History: Artery Disease, Hypertension, Type II Diabetes, Clustered Wound: No Osteoarthritis Photos Photo Uploaded By: Alric Quan on 11/06/2016 16:15:25 Wound Measurements Length: (cm) 1 Width: (cm) 0.7 Depth: (cm) 0.1 Area: (cm) 0.55 Volume: (cm) 0.055 % Reduction in Area: 91.2% % Reduction in Volume: 91.2% Epithelialization: Medium (34-66%) Tunneling: No Undermining: No Wound Description Classification: Grade 1 Wound Margin: Distinct, outline attached Exudate Amount: Large Exudate Type: Serous Exudate Color: amber Foul Odor After  Cleansing: No Slough/Fibrino Yes Wound Bed Granulation Amount: Large (67-100%) Granulation Quality: Red Necrotic Amount: Small (1-33%) Necrotic Quality: Adherent Slough Periwound Skin Texture Texture Color No Abnormalities Noted: No No Abnormalities  Noted: No Erythema: Yes Moisture LANA, FLAIM. ([id number]) No Abnormalities Noted: No Erythema Location: Circumferential Maceration: Yes Temperature / Pain Temperature: No Abnormality Tenderness on Palpation: Yes Wound Preparation Ulcer Cleansing: Rinsed/Irrigated with Saline, Other: soap and water, Topical Anesthetic Applied: Other: lidocaine 4%, Treatment Notes Wound #6 (Right, Lateral Lower Leg) 1. Cleansed with: Clean wound with Normal Saline Cleanse wound with antibacterial soap and water 2. Anesthetic Topical Lidocaine 4% cream to wound bed prior to debridement 3. Peri-wound Care: Barrier cream 4. Dressing Applied: Other dressing (specify in notes) 5. Secondary Dressing Applied ABD Pad 7. Secured with Tape Notes xtrasorb, kerlix, coban, unna to Engineer, production) Signed: 11/06/2016 4:55:44 PM By: Alric Quan Entered By: Alric Quan on 11/06/2016 08:24:36 [name] ([id number]) -------------------------------------------------------------------------------- Wound Assessment Details Patient Name: ALAZIA, CROCKET B. Date of Service: 11/06/2016 8:00 AM Medical Record Number: [id number] Patient Account Number: [id number] Date of Birth/Sex: 1945-06-18 (71 y.o. Female) Treating RN: Carolyne Fiscal, Debi Primary Care Annaleigha Woo: Lelon Huh Other Clinician: Referring Lucky Trotta: Lelon Huh Treating Xzayvion Vaeth/Extender: Frann Rider in Treatment: 5 Wound Status Wound Number: 7 Primary Etiology: Lymphedema Wound Location: Right, Medial Lower Leg Wound Status: Healed - Epithelialized Wounding Event: Gradually Appeared Date Acquired: 10/23/2016 Weeks Of Treatment: 2 Clustered Wound:  No Photos Photo Uploaded By: Alric Quan on 11/06/2016 16:15:59 Wound Measurements Length: (cm) 0 Width: (cm) 0 Depth: (cm) 0 Area: (cm) 0 Volume: (cm) 0 % Reduction in Area: 100% % Reduction in Volume: 100% Wound Description Classification: Partial Thickness Periwound Skin Texture Texture Color No Abnormalities Noted: No No Abnormalities Noted: No Moisture No Abnormalities Noted: No Electronic Signature(s) Signed: 11/06/2016 4:55:44 PM By: Alric Quan Entered By: Alric Quan on 11/06/2016 08:23:32 [name] ([id number]) -------------------------------------------------------------------------------- [place] Details Patient Name: Jillian Albe B. Date of Service: 11/06/2016 8:00 AM Medical Record Number: [id number] Patient Account Number: [id number] Date of Birth/Sex: [birth year] (71 y.o. Female) Treating RN: Carolyne Fiscal, Debi Primary Care Godfrey Tritschler: Lelon Huh Other Clinician: Referring Anesia Blackwell: Lelon Huh Treating Dominyck Reser/Extender: Frann Rider in Treatment: 5 Vital Signs Time Taken: 08:13 Temperature (F): 97.9 Height (in): 64 Pulse (bpm): 62 Weight (lbs): 206 Respiratory Rate (breaths/min): 16 Body Mass Index (BMI): 35.4 Blood Pressure (mmHg): 136/57 Reference Range: 80 - 120 mg / dl Electronic Signature(s) Signed: 11/06/2016 4:55:44 PM By: Alric Quan Entered By: Alric Quan on 11/06/2016 08:13:36

## 2016-11-07 NOTE — Progress Notes (Signed)
BANITA, LEHN ([id number]) Visit Report for 11/06/2016 Chief Complaint Document Details Patient Name: [name], [name]. Date of Service: 11/06/2016 8:00 AM Medical Record Number: [id number] Patient Account Number: [id number] Date of Birth/Sex: Mar 14, 1945 (71 y.o. Female) Treating RN: Ahmed Prima Primary Care Provider: Lelon Huh Other Clinician: Referring Provider: Lelon Huh Treating Provider/Extender: Frann Rider in Treatment: 5 Information Obtained from: Patient Chief Complaint Patient seen for complaints of Non-Healing Wounds to both lower extremities which she's had for about 2 months with swelling. Electronic Signature(s) Signed: 11/06/2016 8:43:39 AM By: Christin Fudge MD, FACS Entered By: Christin Fudge on 11/06/2016 08:43:39 [name] ([id number]) -------------------------------------------------------------------------------- HPI Details Patient Name: [name], [name]. Date of Service: 11/06/2016 8:00 AM Medical Record Number: [id number] Patient Account Number: [id number] Date of Birth/Sex: Mar 21, 1945 (71 y.o. Female) Treating RN: Ahmed Prima Primary Care Provider: Lelon Huh Other Clinician: Referring Provider: Lelon Huh Treating Provider/Extender: Frann Rider in Treatment: 5 History of Present Illness Location: both lower extremity swelling with ulceration Quality: Patient reports experiencing a sharp pain to affected area(s). Severity: Patient states wound are getting worse. Duration: Patient has had the wound for > 2 months prior to seeking treatment at the wound center Timing: Pain in wound is constant (hurts all the time) and is worse when she elevates her legs Context: The wound would happen gradually Modifying Factors: Other treatment(s) tried include:she has been on ciprofloxacin and has been applying Silvadene ointment Associated Signs and Symptoms: Patient reports having increase swelling. HPI Description: 71 year old  patient who sees her PCP Dr. Lelon Huh was recently evaluated 72 days ago for diabetes mellitus, hypertension, CHF and hyperlipidemia. she also was noted to have ulcerations develop in her legs and she has been applying Silvadene dressings locally. In the past she has refused wound care referrals.her cardiologist Dr. Saunders Revel saw her and put her on 40 mg of furosemide daily. last hemoglobin A1c was 7.7%. she was also placed on ciprofloxacin twice daily for 7 days and a urine culture was recommended. past medical history significant for coronary artery disease, diabetes mellitus, nonischemic cardiomyopathy and pulmonary hypertension. She is also status post heart catheterization and coronary angiography, tubal ligation and breast cyst removal in the past. She has never been a smoker. Patient had arterial studies done which showed bilateral ABIs are artificially elevated due to noncompressible and calcified vessels. Triphasic waveform throughout. Right great toe TBI is elevated while the left is normal. 10/23/2016 -- the patient is rather moribund from several issues including chronic back pain and knee pain and swelling of her legs. The large necrotic area on her left lateral anterior calf was bleeding on touch after washing her leg. There was a spot which needed silver nitrate cauterization and this was done appropriately. 11/06/2016 -- she is again noted to have friable bleeding from the left lower extremity wounds and this again had to be cauterized with silver nitrate to control the bleeding as pressure itself would not do it. Electronic Signature(s) Signed: 11/06/2016 8:44:12 AM By: Christin Fudge MD, FACS Entered By: Christin Fudge on 11/06/2016 08:44:11 [name] ([id number]) -------------------------------------------------------------------------------- Physical Exam Details Patient Name: [name], [name]. Date of Service: 11/06/2016 8:00 AM Medical Record Number: [id number] Patient  Account Number: [id number] Date of Birth/Sex: [birth year] (71 y.o. Female) Treating RN: Ahmed Prima Primary Care Provider: Lelon Huh Other Clinician: Referring Provider: Lelon Huh Treating Provider/Extender: Frann Rider in Treatment: 5 Constitutional . Pulse regular. Respirations normal and unlabored. Afebrile. . Eyes Nonicteric. Reactive to light. Ears,  Nose, Mouth, and Throat Lips, teeth, and gums WNL.Marland Kitchen Moist mucosa without lesions. Neck supple and nontender. No palpable supraclavicular or cervical adenopathy. Normal sized without goiter. Respiratory WNL. No retractions.. Cardiovascular Pedal Pulses WNL. No clubbing, cyanosis or edema. Lymphatic No adneopathy. No adenopathy. No adenopathy. Musculoskeletal Adexa without tenderness or enlargement.. Digits and nails w/o clubbing, cyanosis, infection, petechiae, ischemia, or inflammatory conditions.. Integumentary (Hair, Skin) No suspicious lesions. No crepitus or fluctuance. No peri-wound warmth or erythema. No masses.Marland Kitchen Psychiatric Judgement and insight Intact.. No evidence of depression, anxiety, or agitation.. Notes both lower extremities bilaterally have significant lymphedema but the right leg ulceration is looking much better. The medial wound on the left lower extremity bled briskly when it was being washed out with saline and I used silver nitrate cauterization to control the bleeding. Electronic Signature(s) Signed: 11/06/2016 8:45:06 AM By: Christin Fudge MD, FACS Entered By: Christin Fudge on 11/06/2016 08:45:06 [name] ([id number]) -------------------------------------------------------------------------------- Physician Orders Details Patient Name: [name], [name]. Date of Service: 11/06/2016 8:00 AM Medical Record Number: [id number] Patient Account Number: [id number] Date of Birth/Sex: 1945-08-16 (71 y.o. Female) Treating RN: Ahmed Prima Primary Care Provider: Lelon Huh Other  Clinician: Referring Provider: Lelon Huh Treating Provider/Extender: Frann Rider in Treatment: 5 Verbal / Phone Orders: Yes Clinician: Carolyne Fiscal, Debi Read Back and Verified: Yes Diagnosis Coding Wound Cleansing Wound #1 Left,Lateral Lower Leg o Clean wound with Normal Saline. o Cleanse wound with mild soap and water o May Shower, gently pat wound dry prior to applying new dressing. Wound #2 Left,Anterior Lower Leg o Clean wound with Normal Saline. o Cleanse wound with mild soap and water o May Shower, gently pat wound dry prior to applying new dressing. Wound #3 Left,Medial Lower Leg o Clean wound with Normal Saline. o Cleanse wound with mild soap and water o May Shower, gently pat wound dry prior to applying new dressing. Wound #6 Right,Lateral Lower Leg o Clean wound with Normal Saline. o Cleanse wound with mild soap and water o May Shower, gently pat wound dry prior to applying new dressing. Anesthetic Wound #1 Left,Lateral Lower Leg o Topical Lidocaine 4% cream applied to wound bed prior to debridement - for clinic use Wound #2 Left,Anterior Lower Leg o Topical Lidocaine 4% cream applied to wound bed prior to debridement - for clinic use Wound #3 Left,Medial Lower Leg o Topical Lidocaine 4% cream applied to wound bed prior to debridement - for clinic use Wound #6 Right,Lateral Lower Leg o Topical Lidocaine 4% cream applied to wound bed prior to debridement - for clinic use Skin Barriers/Peri-Wound Care Wound #1 Left,Lateral Lower Leg o Barrier cream Wound #2 Left,Anterior Lower Leg o Barrier cream Wound #3 Left,Medial Lower Leg o Barrier cream [name], [name] ([id number]) Primary Wound Dressing Wound #1 Left,Lateral Lower Leg o Santyl Ointment Wound #2 Left,Anterior Lower Leg o Silvercel Non-Adherent Wound #3 Left,Medial Lower Leg o Santyl Ointment Wound #6 Right,Lateral Lower Leg o Silvercel  Non-Adherent Secondary Dressing Wound #1 Left,Lateral Lower Leg o ABD pad o Dry Gauze o XtraSorb Wound #2 Left,Anterior Lower Leg o ABD pad o Dry Gauze o XtraSorb Wound #3 Left,Medial Lower Leg o ABD pad o Dry Gauze o XtraSorb Wound #6 Right,Lateral Lower Leg o ABD pad o Dry Gauze o XtraSorb Dressing Change Frequency Wound #1 Left,Lateral Lower Leg o Change Dressing Monday, Wednesday, Friday - HHRN to change Wednesday and Fridays and pt to come to [place] on Mondays. Wound #2 Left,Anterior Lower Leg o Change  Dressing Monday, Wednesday, Friday - HHRN to change Wednesday and Fridays and pt to come to [place] on Mondays. Wound #3 Left,Medial Lower Leg o Change Dressing Monday, Wednesday, Friday - HHRN to change Wednesday and Fridays and pt to come to [place] on Mondays. Wound #6 Right,Lateral Lower Leg o Change Dressing Monday, Wednesday, Friday - HHRN to change Wednesday and Fridays and pt to come to [place] on Mondays. Follow-up Appointments Wound #1 Left,Lateral Lower Leg o Return Appointment in 1 week. Wound #2 Left,Anterior Lower Leg [name], [name] ([id number]) o Return Appointment in 1 week. Wound #3 Left,Medial Lower Leg o Return Appointment in 1 week. Wound #6 Right,Lateral Lower Leg o Return Appointment in 1 week. Edema Control Wound #1 Left,Lateral Lower Leg o Kerlix and Coban - Bilateral - from 3cm from toes to 3cm from knees o Elevate legs to the level of the heart and pump ankles as often as possible Wound #2 Left,Anterior Lower Leg o Kerlix and Coban - Bilateral - from 3cm from toes to 3cm from knees o Elevate legs to the level of the heart and pump ankles as often as possible Wound #3 Left,Medial Lower Leg o Kerlix and Coban - Bilateral - from 3cm from toes to 3cm from knees o Elevate legs to the level of the heart and pump ankles as often as possible Wound #6  Right,Lateral Lower Leg o Kerlix and Coban - Bilateral - from 3cm from toes to 3cm from knees o Elevate legs to the level of the heart and pump ankles as often as possible Additional Orders / Instructions Wound #1 Left,Lateral Lower Leg o Increase protein intake. Wound #2 Left,Anterior Lower Leg o Increase protein intake. Wound #3 Left,Medial Lower Leg o Increase protein intake. Wound #6 Right,Lateral Lower Leg o Increase protein intake. Home Health Wound #1 [place] Nurse may visit PRN to address patientos wound care needs. o FACE TO FACE ENCOUNTER: MEDICARE and MEDICAID PATIENTS: I certify that this patient is under my care and that I had a face-to-face encounter that meets the physician face-to-face encounter requirements with this patient on this date. The encounter with the patient was in whole or in part for the following MEDICAL CONDITION: (primary reason for [place]) MEDICAL NECESSITY: I certify, that based on my findings, NURSING services are a medically necessary home health service. HOME BOUND STATUS: I certify that my clinical findings support that this patient is homebound (i.e., Due to illness or injury, pt requires aid of supportive devices such as crutches, cane, wheelchairs, walkers, the use of special transportation or the assistance of another person to leave their place of residence. There is a normal inability to leave the home and doing so requires considerable and taxing effort. Other absences are for medical reasons / religious services and are infrequent or of short duration when for other reasons). o If current dressing causes regression in wound condition, may D/C ordered dressing product/s and apply Normal Saline Moist Dressing daily until next [place] / Other MD appointment. [place] of regression in wound condition at [phone]. o Please  direct any NON-WOUND related issues/requests for orders to patient's Primary Care Physician [name], [name] ([id number]) Wound #2 [place] Nurse may visit PRN to address patientos wound care needs. o FACE TO FACE ENCOUNTER: MEDICARE and MEDICAID PATIENTS: I certify that this patient  is under my care and that I had a face-to-face encounter that meets the physician face-to-face encounter requirements with this patient on this date. The encounter with the patient was in whole or in part for the following MEDICAL CONDITION: (primary reason for [place]) MEDICAL NECESSITY: I certify, that based on my findings, NURSING services are a medically necessary home health service. HOME BOUND STATUS: I certify that my clinical findings support that this patient is homebound (i.e., Due to illness or injury, pt requires aid of supportive devices such as crutches, cane, wheelchairs, walkers, the use of special transportation or the assistance of another person to leave their place of residence. There is a normal inability to leave the home and doing so requires considerable and taxing effort. Other absences are for medical reasons / religious services and are infrequent or of short duration when for other reasons). o If current dressing causes regression in wound condition, may D/C ordered dressing product/s and apply Normal Saline Moist Dressing daily until next [place] / Other MD appointment. [place] of regression in wound condition at [phone]. o Please direct any NON-WOUND related issues/requests for orders to patient's Primary Care Physician Wound #3 Left,Medial Lower Leg o [place] Nurse may visit PRN to address patientos wound care needs. o FACE TO FACE ENCOUNTER: MEDICARE and MEDICAID PATIENTS: I certify that this patient is under my care and that I had  a face-to-face encounter that meets the physician face-to-face encounter requirements with this patient on this date. The encounter with the patient was in whole or in part for the following MEDICAL CONDITION: (primary reason for [place]) MEDICAL NECESSITY: I certify, that based on my findings, NURSING services are a medically necessary home health service. HOME BOUND STATUS: I certify that my clinical findings support that this patient is homebound (i.e., Due to illness or injury, pt requires aid of supportive devices such as crutches, cane, wheelchairs, walkers, the use of special transportation or the assistance of another person to leave their place of residence. There is a normal inability to leave the home and doing so requires considerable and taxing effort. Other absences are for medical reasons / religious services and are infrequent or of short duration when for other reasons). o If current dressing causes regression in wound condition, may D/C ordered dressing product/s and apply Normal Saline Moist Dressing daily until next [place] / Other MD appointment. [place] of regression in wound condition at [phone]. o Please direct any NON-WOUND related issues/requests for orders to patient's Primary Care Physician Wound #6 [place] Nurse may visit PRN to address patientos wound care needs. o FACE TO FACE ENCOUNTER: MEDICARE and MEDICAID PATIENTS: I certify that this patient is under my care and that I had a face-to-face encounter that meets the physician face-to-face encounter requirements with this patient on this date. The encounter with the patient was in whole or in part for the following MEDICAL CONDITION: (primary reason for [place]) MEDICAL NECESSITY: I certify, that based on my findings, NURSING services are a medically necessary home health service. HOME BOUND  STATUS: I certify that my clinical findings support that this patient is homebound (i.e., Due to illness or injury, pt requires aid of supportive devices such as crutches, cane, wheelchairs, walkers, the use of special transportation or the assistance of another person to leave their place of residence. There  is a normal inability to leave the home and doing so requires considerable and taxing effort. Other absences are for medical reasons / religious services and are infrequent or of short duration when for other reasons). o If current dressing causes regression in wound condition, may D/C ordered dressing product/s and apply Normal Saline Moist Dressing daily until next [place] / Other MD appointment. [place] of regression in wound condition at [phone]. o Please direct any NON-WOUND related issues/requests for orders to patient's Primary Care Physician Medications-please add to medication list. Wound #1 Left,Lateral Lower Leg o Santyl Enzymatic Ointment [name], [name] ([id number]) o Other: - Vitamin C, Zinc, Multivitamin Wound #2 Left,Anterior Lower Leg o Other: - Vitamin C, Zinc, Multivitamin Wound #3 Left,Medial Lower Leg o Santyl Enzymatic Ointment o Other: - Vitamin C, Zinc, Multivitamin Wound #6 Right,Lateral Lower Leg o Other: - Vitamin C, Zinc, Multivitamin Electronic Signature(s) Signed: 11/06/2016 12:44:08 PM By: Christin Fudge MD, FACS Signed: 11/06/2016 4:55:44 PM By: Alric Quan Entered By: Alric Quan on 11/06/2016 09:55:45 Tubbs, Jeanice Lim ([id number]) -------------------------------------------------------------------------------- Problem List Details Patient Name: [name], [name]. Date of Service: 11/06/2016 8:00 AM Medical Record Number: [id number] Patient Account Number: [id number] Date of Birth/Sex: Oct 28, 1945 (71 y.o. Female) Treating RN: Ahmed Prima Primary Care Provider: Lelon Huh Other  Clinician: Referring Provider: Lelon Huh Treating Provider/Extender: Frann Rider in Treatment: 5 Active Problems ICD-10 Encounter Code Description Active Date Diagnosis E11.622 Type 2 diabetes mellitus with other skin ulcer 10/02/2016 Yes I89.0 Lymphedema, not elsewhere classified 10/02/2016 Yes L97.212 Non-pressure chronic ulcer of right calf with fat layer exposed 10/02/2016 Yes L97.222 Non-pressure chronic ulcer of left calf with fat layer exposed 10/02/2016 Yes Inactive Problems Resolved Problems Electronic Signature(s) Signed: 11/06/2016 8:43:04 AM By: Christin Fudge MD, FACS Entered By: Christin Fudge on 11/06/2016 08:43:04 [name] ([id number]) -------------------------------------------------------------------------------- Progress Note Details Patient Name: Devoria Albe Watson. Date of Service: 11/06/2016 8:00 AM Medical Record Number: [id number] Patient Account Number: [id number] Date of Birth/Sex: Feb 10, 1945 (71 y.o. Female) Treating RN: Ahmed Prima Primary Care Provider: Lelon Huh Other Clinician: Referring Provider: Lelon Huh Treating Provider/Extender: Frann Rider in Treatment: 5 Subjective Chief Complaint Information obtained from Patient Patient seen for complaints of Non-Healing Wounds to both lower extremities which she's had for about 2 months with swelling. History of Present Illness (HPI) The following HPI elements were documented for the patient's wound: Location: both lower extremity swelling with ulceration Quality: Patient reports experiencing a sharp pain to affected area(s). Severity: Patient states wound are getting worse. Duration: Patient has had the wound for > 2 months prior to seeking treatment at the wound center Timing: Pain in wound is constant (hurts all the time) and is worse when she elevates her legs Context: The wound would happen gradually Modifying Factors: Other treatment(s) tried include:she has been on  ciprofloxacin and has been applying Silvadene ointment Associated Signs and Symptoms: Patient reports having increase swelling. 71 year old patient who sees her PCP Dr. Lelon Huh was recently evaluated 72 days ago for diabetes mellitus, hypertension, CHF and hyperlipidemia. she also was noted to have ulcerations develop in her legs and she has been applying Silvadene dressings locally. In the past she has refused wound care referrals.her cardiologist Dr. Saunders Revel saw her and put her on 40 mg of furosemide daily. last hemoglobin A1c was 7.7%. she was also placed on ciprofloxacin twice daily for 7 days and a urine culture was recommended. past medical history significant for coronary artery disease, diabetes  mellitus, nonischemic cardiomyopathy and pulmonary hypertension. She is also status post heart catheterization and coronary angiography, tubal ligation and breast cyst removal in the past. She has never been a smoker. Patient had arterial studies done which showed bilateral ABIs are artificially elevated due to noncompressible and calcified vessels. Triphasic waveform throughout. Right great toe TBI is elevated while the left is normal. 10/23/2016 -- the patient is rather moribund from several issues including chronic back pain and knee pain and swelling of her legs. The large necrotic area on her left lateral anterior calf was bleeding on touch after washing her leg. There was a spot which needed silver nitrate cauterization and this was done appropriately. 11/06/2016 -- she is again noted to have friable bleeding from the left lower extremity wounds and this again had to be cauterized with silver nitrate to control the bleeding as pressure itself would not do it. Patient History Information obtained from Patient. Family History Cancer - Mother,Siblings, Diabetes - Mother,Siblings, Heart Disease - Mother, Hypertension - Siblings, No family history of Hereditary Spherocytosis, [name] Disease,  Lung Disease, Seizures, Stroke, Thyroid Problems, Tuberculosis. Social History Never smoker, Marital Status - Married, Alcohol Use - Never, Drug Use - No History, Caffeine Use - Daily. [name], [name] ([id number]) Objective Constitutional Pulse regular. Respirations normal and unlabored. Afebrile. Vitals Time Taken: 8:13 AM, Height: 64 in, Weight: 206 lbs, BMI: 35.4, Temperature: 97.9 F, Pulse: 62 bpm, Respiratory Rate: 16 breaths/min, Blood Pressure: 136/57 mmHg. Eyes Nonicteric. Reactive to light. Ears, Nose, Mouth, and Throat Lips, teeth, and gums WNL.Marland Kitchen Moist mucosa without lesions. Neck supple and nontender. No palpable supraclavicular or cervical adenopathy. Normal sized without goiter. Respiratory WNL. No retractions.. Cardiovascular Pedal Pulses WNL. No clubbing, cyanosis or edema. Lymphatic No adneopathy. No adenopathy. No adenopathy. Musculoskeletal Adexa without tenderness or enlargement.. Digits and nails w/o clubbing, cyanosis, infection, petechiae, ischemia, or inflammatory conditions.Marland Kitchen Psychiatric Judgement and insight Intact.. No evidence of depression, anxiety, or agitation.. General Notes: both lower extremities bilaterally have significant lymphedema but the right leg ulceration is looking much better. The medial wound on the left lower extremity bled briskly when it was being washed out with saline and I used silver nitrate cauterization to control the bleeding. Integumentary (Hair, Skin) No suspicious lesions. No crepitus or fluctuance. No peri-wound warmth or erythema. No masses.. Wound #1 status is Open. Original cause of wound was Gradually Appeared. The wound is located on the Left,Lateral Lower Leg. The wound measures 6.5cm length x 4.5cm width x 0.2cm depth; 22.973cm^2 area and 4.595cm^3 volume. There is no tunneling or undermining noted. There is a large amount of serosanguineous drainage noted. The wound margin is distinct with the outline attached to  the wound base. There is no granulation within the wound bed. There is a large (67-100%) amount of necrotic tissue within the wound bed including Eschar and Adherent Slough. The periwound skin appearance exhibited: Maceration, Erythema. The surrounding wound skin color is noted with erythema which is circumferential. Periwound temperature was noted as No Abnormality. TANAYAH, SQUITIERI ([id number]) Wound #2 status is Open. Original cause of wound was Gradually Appeared. The wound is located on the Left,Anterior Lower Leg. The wound measures 9cm length x 7cm width x 0.1cm depth; 49.48cm^2 area and 4.948cm^3 volume. There is no tunneling or undermining noted. There is a large amount of serous drainage noted. The wound margin is distinct with the outline attached to the wound base. There is small (1-33%) pink granulation within the wound bed. There is a  large (67-100%) amount of necrotic tissue within the wound bed including Eschar and Adherent Slough. The periwound skin appearance exhibited: Maceration, Erythema. The surrounding wound skin color is noted with erythema which is circumferential. Periwound temperature was noted as No Abnormality. The periwound has tenderness on palpation. Wound #3 status is Open. Original cause of wound was Gradually Appeared. The wound is located on the Left,Medial Lower Leg. The wound measures 2.9cm length x 2cm width x 0.1cm depth; 4.555cm^2 area and 0.456cm^3 volume. There is no tunneling or undermining noted. There is a large amount of serous drainage noted. The wound margin is distinct with the outline attached to the wound base. There is no granulation within the wound bed. There is a large (67-100%) amount of necrotic tissue within the wound bed including Eschar and Adherent Slough. The periwound skin appearance exhibited: Maceration, Erythema. The surrounding wound skin color is noted with erythema which is circumferential. Periwound temperature was noted as No  Abnormality. The periwound has tenderness on palpation. Wound #6 status is Open. Original cause of wound was Gradually Appeared. The wound is located on the Right,Lateral Lower Leg. The wound measures 1cm length x 0.7cm width x 0.1cm depth; 0.55cm^2 area and 0.055cm^3 volume. There is no tunneling or undermining noted. There is a large amount of serous drainage noted. The wound margin is distinct with the outline attached to the wound base. There is large (67-100%) red granulation within the wound bed. There is a small (1-33%) amount of necrotic tissue within the wound bed including Adherent Slough. The periwound skin appearance exhibited: Maceration, Erythema. The surrounding wound skin color is noted with erythema which is circumferential. Periwound temperature was noted as No Abnormality. The periwound has tenderness on palpation. Wound #7 status is Healed - Epithelialized. Original cause of wound was Gradually Appeared. The wound is located on the Right,Medial Lower Leg. The wound measures 0cm length x 0cm width x 0cm depth; 0cm^2 area and 0cm^3 volume. 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 Cleanse wound with mild soap and water May Shower, gently pat wound dry prior to applying new dressing. Wound #2 Left,Anterior Lower Leg: Clean wound with Normal Saline. Cleanse wound with mild soap and water May Shower, gently pat wound dry prior to applying new dressing. Wound #3 Left,Medial Lower Leg: Clean wound with Normal Saline. [name], [name] ([id number]) Cleanse wound with mild soap and water May Shower, gently pat wound dry prior to applying new  dressing. Wound #6 Right,Lateral Lower Leg: Clean wound with Normal Saline. Cleanse wound with mild soap and water May Shower, gently pat wound dry prior to applying new dressing. Anesthetic: Wound #1 Left,Lateral Lower Leg: Topical Lidocaine 4% cream applied to wound bed prior to debridement - for clinic use Wound #2 Left,Anterior Lower Leg: Topical Lidocaine 4% cream applied to wound bed prior to debridement - for clinic use Wound #3 Left,Medial Lower Leg: Topical Lidocaine 4% cream applied to wound bed prior to debridement - for clinic use Wound #6 Right,Lateral Lower Leg: Topical Lidocaine 4% cream applied to wound bed prior to debridement - for clinic use Skin Barriers/Peri-Wound Care: Wound #1 Left,Lateral Lower Leg: Barrier cream Wound #2 Left,Anterior Lower Leg: Barrier cream Wound #3 Left,Medial  Lower Leg: Barrier cream Primary Wound Dressing: Wound #1 Left,Lateral Lower Leg: Santyl Ointment Wound #2 Left,Anterior Lower Leg: Silvercel Non-Adherent Wound #3 Left,Medial Lower Leg: Santyl Ointment Wound #6 Right,Lateral Lower Leg: Silvercel Non-Adherent Secondary Dressing: Wound #1 Left,Lateral Lower Leg: ABD pad Dry Gauze XtraSorb Wound #2 Left,Anterior Lower Leg: ABD pad Dry Gauze XtraSorb Wound #3 Left,Medial Lower Leg: ABD pad Dry Gauze XtraSorb Wound #6 Right,Lateral Lower Leg: ABD pad Dry Gauze XtraSorb Dressing Change Frequency: Wound #1 Left,Lateral Lower Leg: Change Dressing Monday, Wednesday, Friday - HHRN to change Wednesday and Fridays and pt to come to [place] on Mondays. Wound #2 Left,Anterior Lower Leg: Change Dressing Monday, Wednesday, Friday - HHRN to change Wednesday and Fridays and pt to come to [place] on Mondays. Wound #3 Left,Medial Lower Leg: Change Dressing Monday, Wednesday, Friday - HHRN to change Wednesday and Fridays and pt to come to [place] on Mondays. Wound #6 Right,Lateral Lower Leg: Change  Dressing Monday, Wednesday, Friday - HHRN to change Wednesday and Fridays and pt to come to [facility] [place] [place] ([id number]) [facility] on Mondays. Follow-up Appointments: Wound #1 Left,Lateral Lower Leg: Return Appointment in 1 week. Wound #2 Left,Anterior Lower Leg: Return Appointment in 1 week. Wound #3 Left,Medial Lower Leg: Return Appointment in 1 week. Wound #6 Right,Lateral Lower Leg: Return Appointment in 1 week. Edema Control: Wound #1 Left,Lateral Lower Leg: Kerlix and Coban - Bilateral - from 3cm from toes to 3cm from knees Elevate legs to the level of the heart and pump ankles as often as possible Wound #2 Left,Anterior Lower Leg: Kerlix and Coban - Bilateral - from 3cm from toes to 3cm from knees Elevate legs to the level of the heart and pump ankles as often as possible Wound #3 Left,Medial Lower Leg: Kerlix and Coban - Bilateral - from 3cm from toes to 3cm from knees Elevate legs to the level of the heart and pump ankles as often as possible Wound #6 Right,Lateral Lower Leg: Kerlix and Coban - Bilateral - from 3cm from toes to 3cm from knees Elevate legs to the level of the heart and pump ankles as often as possible Additional Orders / Instructions: Wound #1 Left,Lateral Lower Leg: Increase protein intake. Wound #2 Left,Anterior Lower Leg: Increase protein intake. Wound #3 Left,Medial Lower Leg: Increase protein intake. Wound #6 Right,Lateral Lower Leg: Increase protein intake. Home Health: Wound #1 Left,Lateral Lower Leg: [place] Nurse may visit PRN to address patient s wound care needs. FACE TO FACE ENCOUNTER: MEDICARE and MEDICAID PATIENTS: I certify that this patient is under my care and that I had a face-to-face encounter that meets the physician face-to-face encounter requirements with this patient on this date. The encounter with the patient was in whole or in part for the following MEDICAL CONDITION: (primary reason for  [place]) MEDICAL NECESSITY: I certify, that based on my findings, NURSING services are a medically necessary home health service. HOME BOUND STATUS: I certify that my clinical findings support that this patient is homebound (i.e., Due to illness or injury, pt requires aid of supportive devices such as crutches, cane, wheelchairs, walkers, the use of special transportation or the assistance of another person to leave their place of residence. There is a normal inability to leave the home and doing so requires considerable and taxing effort. Other absences are for medical reasons / religious services and are infrequent or of short duration when for other reasons). If current dressing  causes regression in wound condition, may D/C ordered dressing product/s and apply Normal Saline Moist Dressing daily until next [place] / Other MD appointment. [place] of regression in wound condition at [phone]. Please direct any NON-WOUND related issues/requests for orders to patient's Primary Care Physician Wound #2 Left,Anterior Lower Leg: [place] Nurse may visit PRN to address patient s wound care needs. FACE TO FACE ENCOUNTER: MEDICARE and MEDICAID PATIENTS: I certify that this patient is under my care and that I had a face-to-face encounter that meets the physician face-to-face encounter requirements with this patient on this date. The encounter with the patient was in whole or in part for the following MEDICAL CONDITION: (primary reason for [place]) MEDICAL NECESSITY: I certify, that based on my findings, NURSING services are a medically necessary home health service. HOME BOUND STATUS: I certify that my clinical findings support that this patient is homebound (i.e., Due to illness or injury, pt requires aid of supportive devices such as crutches, cane, wheelchairs, walkers, the use of special transportation or the assistance  of another person to leave their place of residence. There is a normal inability to leave the home and doing so requires considerable and taxing effort. Other absences are for medical reasons / religious services and JACQUALINE, WEICHEL ([id number]) are infrequent or of short duration when for other reasons). If current dressing causes regression in wound condition, may D/C ordered dressing product/s and apply Normal Saline Moist Dressing daily until next [place] / Other MD appointment. [place] of regression in wound condition at [phone]. Please direct any NON-WOUND related issues/requests for orders to patient's Primary Care Physician Wound #3 Left,Medial Lower Leg: [place] Nurse may visit PRN to address patient s wound care needs. FACE TO FACE ENCOUNTER: MEDICARE and MEDICAID PATIENTS: I certify that this patient is under my care and that I had a face-to-face encounter that meets the physician face-to-face encounter requirements with this patient on this date. The encounter with the patient was in whole or in part for the following MEDICAL CONDITION: (primary reason for [place]) MEDICAL NECESSITY: I certify, that based on my findings, NURSING services are a medically necessary home health service. HOME BOUND STATUS: I certify that my clinical findings support that this patient is homebound (i.e., Due to illness or injury, pt requires aid of supportive devices such as crutches, cane, wheelchairs, walkers, the use of special transportation or the assistance of another person to leave their place of residence. There is a normal inability to leave the home and doing so requires considerable and taxing effort. Other absences are for medical reasons / religious services and are infrequent or of short duration when for other reasons). If current dressing causes regression in wound condition, may D/C ordered dressing product/s and  apply Normal Saline Moist Dressing daily until next [place] / Other MD appointment. [place] of regression in wound condition at [phone]. Please direct any NON-WOUND related issues/requests for orders to patient's Primary Care Physician Wound #6 Right,Lateral Lower Leg: [place] Nurse may visit PRN to address patient s wound care needs. FACE TO FACE ENCOUNTER: MEDICARE and MEDICAID PATIENTS: I certify that this patient is under my care and that I had a face-to-face encounter that meets the physician face-to-face encounter requirements with this patient on this date. The encounter with the patient was in whole or  in part for the following MEDICAL CONDITION: (primary reason for Home Healthcare) MEDICAL NECESSITY: I certify, that based on my findings, NURSING services are a medically necessary home health service. HOME BOUND STATUS: I certify that my clinical findings support that this patient is homebound (i.e., Due to illness or injury, pt requires aid of supportive devices such as crutches, cane, wheelchairs, walkers, the use of special transportation or the assistance of another person to leave their place of residence. There is a normal inability to leave the home and doing so requires considerable and taxing effort. Other absences are for medical reasons / religious services and are infrequent or of short duration when for other reasons). If current dressing causes regression in wound condition, may D/C ordered dressing product/s and apply Normal Saline Moist Dressing daily until next [place] / Other MD appointment. [place] of regression in wound condition at [phone]. Please direct any NON-WOUND related issues/requests for orders to patient's Primary Care Physician Medications-please add to medication list.: Wound #1 Left,Lateral Lower Leg: Santyl Enzymatic Ointment Other: - Vitamin C,  Zinc, Multivitamin Wound #2 Left,Anterior Lower Leg: Other: - Vitamin C, Zinc, Multivitamin Wound #3 Left,Medial Lower Leg: Santyl Enzymatic Ointment Other: - Vitamin C, Zinc, Multivitamin Wound #6 Right,Lateral Lower Leg: Other: - Vitamin C, Zinc, Multivitamin After review today, I have recommended: 1. Silver alginate to the right lower extremity and Santyl ointment to the left lower extremity to be wrapped with Kerlix and Coban to be changed 3 times a week 2. Elevation and exercise discussed with her in great detail EDDITH, MENTOR ([id number]) 3. her husband who is the caregiver and is at the bedside, has had several questions which I have answered to his satisfaction. 4. Regular visits to the wound center. they have been instructed that if at any stage the wound bleeds significantly pressure would be the best method to control this. Electronic Signature(s) Signed: 11/06/2016 12:43:20 PM By: Christin Fudge MD, FACS Previous Signature: 11/06/2016 8:46:12 AM Version By: Christin Fudge MD, FACS Entered By: Christin Fudge on 11/06/2016 12:43:20 [name] ([id number]) -------------------------------------------------------------------------------- ROS/PFSH Details Patient Name: NOZOMI, METTLER Watson. Date of Service: 11/06/2016 8:00 AM Medical Record Number: [id number] Patient Account Number: [id number] Date of Birth/Sex: 21-Jan-1945 (71 y.o. Female) Treating RN: Carolyne Fiscal, Debi Primary Care Provider: Lelon Huh Other Clinician: Referring Provider: Lelon Huh Treating Provider/Extender: Frann Rider in Treatment: 5 Information Obtained From Patient Wound History Do you currently have one or more open woundso Yes How many open wounds do you currently haveo 6 Approximately how long have you had your woundso 2 months How have you been treating your wound(s) until nowo [place] Has your wound(s) ever healed and then re-openedo No Have you had any lab work done in the past  montho No Have you tested positive for an antibiotic resistant organism (MRSA, VRE)o No Have you tested positive for osteomyelitis (bone infection)o No Have you had any tests for circulation on your legso Yes Where was the test doneo [facility] Have you had other problems associated with your woundso Infection, Swelling Respiratory Medical History: Positive for: Asthma Cardiovascular Medical History: Positive for: Congestive Heart Failure; Coronary Artery Disease; Hypertension Endocrine Medical History: Positive for: Type II Diabetes Time with diabetes: a long time Treated with: Oral agents Blood sugar tested every day: No Musculoskeletal Medical History: Positive for: Osteoarthritis Immunizations Pneumococcal Vaccine: Received Pneumococcal Vaccination: Yes Implantable Devices Family and Social History Cancer: Yes - Mother,Siblings; Diabetes: Yes - Mother,Siblings; Heart Disease:  Yes - Mother; Hereditary Spherocytosis: No; Hypertension: Yes - Siblings; [name] Disease: No; Lung Disease: No; Seizures: No; Stroke: No; Thyroid Problems: No; SEARA, HINESLEY Watson. ([id number]) Tuberculosis: No; Never smoker; Marital Status - Married; Alcohol Use: Never; Drug Use: No History; Caffeine Use: Daily; Financial Concerns: No; Food, Clothing or Shelter Needs: No; Support System Lacking: No; Transportation Concerns: No; Advanced Directives: No; Patient does not want information on Advanced Directives; Do not resuscitate: No; Living Will: No; Medical Power of Attorney: No Physician Affirmation I have reviewed and agree with the above information. Electronic Signature(s) Signed: 11/06/2016 12:44:08 PM By: Christin Fudge MD, FACS Signed: 11/06/2016 4:55:44 PM By: Alric Quan Entered By: Christin Fudge on 11/06/2016 08:44:20 [name] ([id number]) -------------------------------------------------------------------------------- SuperBill Details Patient Name: CHARNETTE, YOUNKIN Watson. Date of Service:  11/06/2016 Medical Record Number: [id number] Patient Account Number: [id number] Date of Birth/Sex: 1945/01/14 (71 y.o. 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 Facility Procedures CPT4 Code: [id number] Description: [phone] - WOUND CARE VISIT-LEV 5 EST PT Modifier: Quantity: 1 Physician Procedures CPT4 Code: [id number] Description: 90931 - WC PHYS LEVEL 3 - EST PT ICD-10 Diagnosis Description E11.622 Type 2 diabetes mellitus with other skin ulcer I89.0 Lymphedema, not elsewhere classified L97.212 Non-pressure chronic ulcer of right calf with fat layer exp L97.222  Non-pressure chronic ulcer of left calf with fat layer expo Modifier: osed sed Quantity: 1 Electronic Signature(s) Signed: 11/06/2016 9:54:21 AM By: Alric Quan Signed: 11/06/2016 12:44:08 PM By: Christin Fudge MD, FACS Previous Signature: 11/06/2016 8:46:25 AM Version By: Christin Fudge MD, FACS Entered By: Alric Quan on 11/06/2016 09:54:20

## 2016-11-08 ENCOUNTER — Ambulatory Visit: Payer: Medicare Other | Admitting: Internal Medicine

## 2016-11-08 ENCOUNTER — Encounter: Payer: Self-pay | Admitting: Internal Medicine

## 2016-11-08 VITALS — BP 112/62 | HR 58 | Ht 64.0 in | Wt 209.0 lb

## 2016-11-08 DIAGNOSIS — J45909 Unspecified asthma, uncomplicated: Secondary | ICD-10-CM | POA: Diagnosis not present

## 2016-11-08 DIAGNOSIS — I509 Heart failure, unspecified: Secondary | ICD-10-CM | POA: Diagnosis not present

## 2016-11-08 DIAGNOSIS — Z79891 Long term (current) use of opiate analgesic: Secondary | ICD-10-CM | POA: Diagnosis not present

## 2016-11-08 DIAGNOSIS — I5042 Chronic combined systolic (congestive) and diastolic (congestive) heart failure: Secondary | ICD-10-CM | POA: Diagnosis not present

## 2016-11-08 DIAGNOSIS — S81809D Unspecified open wound, unspecified lower leg, subsequent encounter: Secondary | ICD-10-CM

## 2016-11-08 DIAGNOSIS — L97211 Non-pressure chronic ulcer of right calf limited to breakdown of skin: Secondary | ICD-10-CM | POA: Diagnosis not present

## 2016-11-08 DIAGNOSIS — I428 Other cardiomyopathies: Secondary | ICD-10-CM | POA: Diagnosis not present

## 2016-11-08 DIAGNOSIS — I5022 Chronic systolic (congestive) heart failure: Secondary | ICD-10-CM | POA: Diagnosis not present

## 2016-11-08 DIAGNOSIS — I2510 Atherosclerotic heart disease of native coronary artery without angina pectoris: Secondary | ICD-10-CM | POA: Diagnosis not present

## 2016-11-08 DIAGNOSIS — I429 Cardiomyopathy, unspecified: Secondary | ICD-10-CM | POA: Diagnosis not present

## 2016-11-08 DIAGNOSIS — M1990 Unspecified osteoarthritis, unspecified site: Secondary | ICD-10-CM | POA: Diagnosis not present

## 2016-11-08 DIAGNOSIS — N289 Disorder of kidney and ureter, unspecified: Secondary | ICD-10-CM | POA: Diagnosis not present

## 2016-11-08 DIAGNOSIS — Z4800 Encounter for change or removal of nonsurgical wound dressing: Secondary | ICD-10-CM | POA: Diagnosis not present

## 2016-11-08 DIAGNOSIS — I272 Pulmonary hypertension, unspecified: Secondary | ICD-10-CM | POA: Diagnosis not present

## 2016-11-08 DIAGNOSIS — L97212 Non-pressure chronic ulcer of right calf with fat layer exposed: Secondary | ICD-10-CM | POA: Diagnosis not present

## 2016-11-08 DIAGNOSIS — Z79899 Other long term (current) drug therapy: Secondary | ICD-10-CM | POA: Diagnosis not present

## 2016-11-08 DIAGNOSIS — Z7984 Long term (current) use of oral hypoglycemic drugs: Secondary | ICD-10-CM | POA: Diagnosis not present

## 2016-11-08 DIAGNOSIS — E11622 Type 2 diabetes mellitus with other skin ulcer: Secondary | ICD-10-CM | POA: Diagnosis not present

## 2016-11-08 DIAGNOSIS — I110 Hypertensive heart disease with heart failure: Secondary | ICD-10-CM | POA: Diagnosis not present

## 2016-11-08 DIAGNOSIS — L97222 Non-pressure chronic ulcer of left calf with fat layer exposed: Secondary | ICD-10-CM | POA: Diagnosis not present

## 2016-11-08 MED ORDER — LISINOPRIL 20 MG PO TABS: 20.0000 mg | ORAL_TABLET | Freq: Every day | ORAL | 3 refills | Status: DC

## 2016-11-08 NOTE — Patient Instructions (Signed)
Medication Instructions:  Your physician has recommended you make the following change in your medication:  1- STOP TAKING Entresto when you finish your current supply. 2- ONCE YOU HAVE STOPPED ENTRESTO, Wait one day, then begin LISINOPRIL 20 mg by mouth once a day.   Labwork: Your physician recommends that you return for lab work in: TODAY (BMP).  Your physician recommends that you return for lab work in: [place] LISINOPRIL.   Testing/Procedures: none  Follow-Up: Your physician recommends that you schedule a follow-up appointment in: 4 MONTHS WITH DR END.  If you need a refill on your cardiac medications before your next appointment, please call your pharmacy.

## 2016-11-08 NOTE — Progress Notes (Addendum)
Follow-up Outpatient Visit Date: 11/08/2016  Primary Care Provider: Birdie Sons, MD [address] [place] [address]  Chief Complaint: Leg swelling  HPI:  Ms. Howton is a 71 y.o. year-old female with history of non-obstructive coronary artery disease, nonischemic cardiomyopathy with chronic systolic and diastolic heart failure, hypertension, diabetes mellitus, and multiple myeloma, who presents for follow-up of leg edema and heart failure. I last saw Ms. Cange in August, at which time she continued to have significant leg swelling complicated by lower extremity wounds. She has subsequently been referred to the wound care clinic and notes that the wounds are gradually improving. Leg swelling has also been better controlled with escalation of furosemide at our last visit.  Ms. Stratmann otherwise reports feeling well. She denies chest pain, short of breath, palpitations, and lightheadedness. She is tolerating her medications well, though she and her husband are concerned about the cost of Entresto.Marland Kitchen  --------------------------------------------------------------------------------------------------  Cardiovascular History & Procedures: Cardiovascular Problems:  Chronic systolic and diastolic heart failure secondary to non-ischemic cardiomyopathy  Nonobstructive coronary artery disease  Moderate aortic stenosis  Risk Factors:  Known coronary artery disease, hypertension, hyperlipidemia, diabetes mellitus, sedentary lifestyle, obesity, and age > 88  Cath/PCI:  LHC/RHC (03/07/16): LMCA normal. LAD with 30% ostial stenosi, 30$ midvessel narrowing, and 50% distal disease. LCx with 40% midvessel lesion. RCA with 30% proximal narrowing. LVEDP 26 mmHg. RA 18 (prominent "M" appearance), RV 82/21, PA mean 55, PCWP 25. Ao sat 96%, PA sat 54%, Fick Co/Ci 3.2/1.7. PVR 9.4 Wood units. No significant AoV gradient.  CV Surgery:  None  EP Procedures and  Devices:  None  Non-Invasive Evaluation(s):  ABI's (07/26/16): Right: ABI not obtained, TBI 1.8. Left: ABI 1.9, TBI 1.0. Triphasic waveforms noted in both lower extremities.  TTE (07/26/16): Normal LV size with moderate LVH. LVEF 55-60% with normal wall motion. Grade 1 diastolic dysfunction. Mild aortic stenosis (mean gradient 11 mmHg). Mitral annular calcification with mild to moderate MR. Mild left atrial enlargement. Normal RV size and function. Normal PA pressure.  TTE (02/25/16): Normal LV size with moderate LVH. LVEF 25-30% with grade 2 diastolic dysfunction. Thickened aortic valve with likely moderate stenosis. MAC with mild MR. Mild LA enlargement. Mildly to moderately reduced RV contraction. Mild-moderate TR. Moderately PH (RVSP 55 mmHg).  Recent CV Pertinent Labs: Lab Results  Component Value Date   CHOL 150 06/22/2016   HDL 47 06/22/2016   LDLCALC 78 06/22/2016   TRIG 125 06/22/2016   CHOLHDL 3.2 06/22/2016   INR 1.0 03/02/2016   BNP 1,592.0 (H) 02/09/2016   K 4.9 09/14/2016   BUN 48 (H) 09/14/2016   BUN 37 (H) 08/02/2016   CREATININE 1.47 (H) 09/14/2016    Past medical and surgical history were reviewed and updated in EPIC.  Current Meds  Medication Sig  . ADVAIR HFA 115-21 MCG/ACT inhaler Inhale 2 puffs into the lungs as needed.   Marland Kitchen albuterol (PROAIR HFA) 108 (90 Base) MCG/ACT inhaler Inhale 2 puffs into the lungs every 6 (six) hours as needed.  Marland Kitchen amLODipine (NORVASC) 5 MG tablet TAKE 1 TABLET (5 MG TOTAL) BY MOUTH DAILY.  Marland Kitchen aspirin EC 81 MG tablet Take 1 tablet (81 mg total) by mouth daily.  Marland Kitchen ENTRESTO 49-51 MG TAKE 1 TABLET BY MOUTH 2 (TWO) TIMES DAILY.  . fluticasone furoate-vilanterol (BREO ELLIPTA) 200-25 MCG/INH AEPB Inhale 1 puff into the lungs daily. FOR ASTHMA J45.20  . furosemide (LASIX) 40 MG tablet Take 1 tablet (40 mg  total) by mouth daily.  Marland Kitchen HYDROcodone-acetaminophen (NORCO) 10-325 MG tablet Take 1 tablet by mouth every 8 (eight) hours as needed.  .  levalbuterol (XOPENEX) 1.25 MG/3ML nebulizer solution Take 1.25 mg by nebulization every 8 (eight) hours as needed for wheezing.  . lovastatin (MEVACOR) 20 MG tablet Take 1 tablet (20 mg total) by mouth at bedtime.  . meloxicam (MOBIC) 15 MG tablet Take 1 tablet (15 mg total) by mouth daily as needed for pain. Take with food  . metFORMIN (GLUCOPHAGE-XR) 750 MG 24 hr tablet Take 2 tablets by mouth daily.  . montelukast (SINGULAIR) 10 MG tablet Take 10 mg by mouth as needed.  . potassium chloride (K-DUR) 10 MEQ tablet Take 1 tablet (10 mEq total) by mouth daily.  . silver sulfADIAZINE (SILVADENE) 1 % cream APPLY TO AFFECTED AREA DAILY  . spironolactone (ALDACTONE) 25 MG tablet TAKE 1 TABLET (25 MG TOTAL) BY MOUTH DAILY.  . traZODone (DESYREL) 100 MG tablet TAKE 1 TABLET BY MOUTH EVERY NIGHT AT BEDTIME AS NEEDED FOR INSOMNIA    Allergies: Hydrochlorothiazide and Penicillins  Social History   Socioeconomic History  . Marital status: Married    Spouse name: Not on file  . Number of children: Not on file  . Years of education: Not on file  . Highest education level: Not on file  Social Needs  . Financial resource strain: Not on file  . Food insecurity - worry: Not on file  . Food insecurity - inability: Not on file  . Transportation needs - medical: Not on file  . Transportation needs - non-medical: Not on file  Occupational History  . Not on file  Tobacco Use  . Smoking status: Never Smoker  . Smokeless tobacco: Never Used  Substance and Sexual Activity  . Alcohol use: No  . Drug use: No  . Sexual activity: Not on file  Other Topics Concern  . Not on file  Social History Narrative  . Not on file    Family History  Problem Relation Age of Onset  . Cancer Mother        Uterine cancer  . Diabetes Mother   . Heart disease Mother   . Heart failure Mother   . Parkinson's disease Father   . Cancer Sister        breast cancer  . Diabetes Brother        Non-insulin Dependent  Diabetes Mellitus  . Mesothelioma Brother   . Hypertension Sister   . Diabetes Sister        Non-insulin dependent Diabetes Mellitus    Review of Systems: A 12-system review of systems was performed and was negative except as noted in the HPI.  --------------------------------------------------------------------------------------------------  Physical Exam: BP 112/62 (BP Location: Right Arm, Patient Position: Sitting, Cuff Size: Large)   Pulse (!) 58   Ht 5' 4"  (1.626 m)   Wt 209 lb (94.8 kg)   BMI 35.87 kg/m   General:  Obese woman, seated comfortably in a wheelchair. She is accompanied by her husband. HEENT: No conjunctival pallor or scleral icterus. Moist mucous membranes.  OP clear. Neck: Supple without lymphadenopathy, thyromegaly, JVD, or HJR. No carotid bruit. Lungs: Normal work of breathing. Clear to auscultation bilaterally without wheezes or crackles. Heart: Regular rate and rhythm with 2/6 systolic murmur. No rubs or gallops. Unable to assess PMI due to body habitus. Abd: Bowel sounds present. Soft, NT/ND. Unable to assess HSM due to body habitus. Ext: Both lower extremities wrapped with dressings  and Ace bandages. At least trace to 1+ pretibial edema is present bilaterally. Skin: Warm and dry  EKG:  Sinus bradycardia (heart rate 50 beats pruritic). Otherwise, no significant abnormalities.  Lab Results  Component Value Date   WBC 6.0 09/14/2016   HGB 8.8 (L) 09/14/2016   HCT 25.9 (L) 09/14/2016   MCV 89.5 09/14/2016   PLT 247 09/14/2016    Lab Results  Component Value Date   NA 135 09/14/2016   K 4.9 09/14/2016   CL 103 09/14/2016   CO2 26 09/14/2016   BUN 48 (H) 09/14/2016   CREATININE 1.47 (H) 09/14/2016   GLUCOSE 124 (H) 09/14/2016   ALT 11 06/22/2016    Lab Results  Component Value Date   CHOL 150 06/22/2016   HDL 47 06/22/2016   LDLCALC 78 06/22/2016   TRIG 125 06/22/2016   CHOLHDL 3.2 06/22/2016     --------------------------------------------------------------------------------------------------  ASSESSMENT AND PLAN: Chronic systolic and diastolic heart failure due to nonischemic cardiomyopathy LVEF had normalized on recent echo. This Chauncey appears grossly euvolemic with the exception of her chronic leg edema that is likely multifactorial, including venous insufficiency. Her weight is gradually increasing, though I suspect some of this is due to dietary indiscretion. We will will check a basic metabolic panel today to reassess her renal function, his creatinine had increased in September. If it has worsened further, we will need to consider de-escalation of furosemide. Due to cost issues, we will switch Entresto to lisinopril 20 mg daily when she has exhausted her current supply. We will need to recheck a basic metabolic panel 2 weeks after making the switch. I will not make any changes to her metoprolol, given borderline low resting heart rate. We will also continue her current dose of spironolactone. I have advised her to follow a low sodium diet.  Coronary artery disease without angina No symptoms suggest worsening coronary insufficiency. We will continue medical therapy with aspirin and lovastatin to prevent progression of disease.  Bilateral lower extremity wounds Gradually healing. Continue follow-up in the wound care center. Vascular studies earlier this year were without evidence of arterial insufficiency.  Renal insufficiency Creatinine in September was slightly above baseline at 1.5 (previously 1.2. Given ongoing diuresis and upcoming switch from Entresto to lisinopril, we will recheck a BMP today.  Follow-up: Return to clinic in 4 months.  Nelva Bush, MD 11/08/2016 11:11 AM

## 2016-11-09 LAB — BASIC METABOLIC PANEL
BUN / CREAT RATIO: 20 (ref 12–28)
BUN: 22 mg/dL (ref 8–27)
CO2: 25 mmol/L (ref 20–29)
CREATININE: 1.08 mg/dL — AB (ref 0.57–1.00)
Calcium: 9.5 mg/dL (ref 8.7–10.3)
Chloride: 104 mmol/L (ref 96–106)
GFR calc non Af Amer: 51 mL/min/{1.73_m2} — ABNORMAL LOW (ref >59)
GFR, EST AFRICAN AMERICAN: 59 mL/min/{1.73_m2} — AB (ref >59)
GLUCOSE: 172 mg/dL — AB (ref 65–99)
Potassium: 5.1 mmol/L (ref 3.5–5.2)
SODIUM: 138 mmol/L (ref 134–144)

## 2016-11-10 DIAGNOSIS — I110 Hypertensive heart disease with heart failure: Secondary | ICD-10-CM | POA: Diagnosis not present

## 2016-11-10 DIAGNOSIS — L97211 Non-pressure chronic ulcer of right calf limited to breakdown of skin: Secondary | ICD-10-CM | POA: Diagnosis not present

## 2016-11-10 DIAGNOSIS — J45909 Unspecified asthma, uncomplicated: Secondary | ICD-10-CM | POA: Diagnosis not present

## 2016-11-10 DIAGNOSIS — E11622 Type 2 diabetes mellitus with other skin ulcer: Secondary | ICD-10-CM | POA: Diagnosis not present

## 2016-11-10 DIAGNOSIS — I2510 Atherosclerotic heart disease of native coronary artery without angina pectoris: Secondary | ICD-10-CM | POA: Diagnosis not present

## 2016-11-10 DIAGNOSIS — I509 Heart failure, unspecified: Secondary | ICD-10-CM | POA: Diagnosis not present

## 2016-11-10 DIAGNOSIS — L97222 Non-pressure chronic ulcer of left calf with fat layer exposed: Secondary | ICD-10-CM | POA: Diagnosis not present

## 2016-11-10 DIAGNOSIS — Z4800 Encounter for change or removal of nonsurgical wound dressing: Secondary | ICD-10-CM | POA: Diagnosis not present

## 2016-11-10 DIAGNOSIS — I272 Pulmonary hypertension, unspecified: Secondary | ICD-10-CM | POA: Diagnosis not present

## 2016-11-10 DIAGNOSIS — L97212 Non-pressure chronic ulcer of right calf with fat layer exposed: Secondary | ICD-10-CM | POA: Diagnosis not present

## 2016-11-10 DIAGNOSIS — M1990 Unspecified osteoarthritis, unspecified site: Secondary | ICD-10-CM | POA: Diagnosis not present

## 2016-11-10 DIAGNOSIS — I429 Cardiomyopathy, unspecified: Secondary | ICD-10-CM | POA: Diagnosis not present

## 2016-11-10 DIAGNOSIS — Z7984 Long term (current) use of oral hypoglycemic drugs: Secondary | ICD-10-CM | POA: Diagnosis not present

## 2016-11-10 DIAGNOSIS — Z79891 Long term (current) use of opiate analgesic: Secondary | ICD-10-CM | POA: Diagnosis not present

## 2016-11-13 ENCOUNTER — Encounter: Payer: Medicare Other | Admitting: Surgery

## 2016-11-13 DIAGNOSIS — Z880 Allergy status to penicillin: Secondary | ICD-10-CM | POA: Diagnosis not present

## 2016-11-13 DIAGNOSIS — L97822 Non-pressure chronic ulcer of other part of left lower leg with fat layer exposed: Secondary | ICD-10-CM | POA: Diagnosis not present

## 2016-11-13 DIAGNOSIS — E11622 Type 2 diabetes mellitus with other skin ulcer: Secondary | ICD-10-CM | POA: Diagnosis not present

## 2016-11-13 DIAGNOSIS — I110 Hypertensive heart disease with heart failure: Secondary | ICD-10-CM | POA: Diagnosis not present

## 2016-11-13 DIAGNOSIS — I272 Pulmonary hypertension, unspecified: Secondary | ICD-10-CM | POA: Diagnosis not present

## 2016-11-13 DIAGNOSIS — M1990 Unspecified osteoarthritis, unspecified site: Secondary | ICD-10-CM | POA: Diagnosis not present

## 2016-11-13 DIAGNOSIS — L97212 Non-pressure chronic ulcer of right calf with fat layer exposed: Secondary | ICD-10-CM | POA: Diagnosis not present

## 2016-11-13 DIAGNOSIS — I429 Cardiomyopathy, unspecified: Secondary | ICD-10-CM | POA: Diagnosis not present

## 2016-11-13 DIAGNOSIS — I509 Heart failure, unspecified: Secondary | ICD-10-CM | POA: Diagnosis not present

## 2016-11-13 DIAGNOSIS — Z7984 Long term (current) use of oral hypoglycemic drugs: Secondary | ICD-10-CM | POA: Diagnosis not present

## 2016-11-13 DIAGNOSIS — L97222 Non-pressure chronic ulcer of left calf with fat layer exposed: Secondary | ICD-10-CM | POA: Diagnosis not present

## 2016-11-13 DIAGNOSIS — J45909 Unspecified asthma, uncomplicated: Secondary | ICD-10-CM | POA: Diagnosis not present

## 2016-11-13 DIAGNOSIS — L97219 Non-pressure chronic ulcer of right calf with unspecified severity: Secondary | ICD-10-CM | POA: Diagnosis not present

## 2016-11-13 DIAGNOSIS — Z79899 Other long term (current) drug therapy: Secondary | ICD-10-CM | POA: Diagnosis not present

## 2016-11-13 DIAGNOSIS — I890 Lymphedema, not elsewhere classified: Secondary | ICD-10-CM | POA: Diagnosis not present

## 2016-11-13 DIAGNOSIS — I2510 Atherosclerotic heart disease of native coronary artery without angina pectoris: Secondary | ICD-10-CM | POA: Diagnosis not present

## 2016-11-14 ENCOUNTER — Inpatient Hospital Stay (HOSPITAL_BASED_OUTPATIENT_CLINIC_OR_DEPARTMENT_OTHER): Payer: Medicare Other | Admitting: Hematology and Oncology

## 2016-11-14 ENCOUNTER — Other Ambulatory Visit: Payer: Self-pay | Admitting: Hematology and Oncology

## 2016-11-14 ENCOUNTER — Inpatient Hospital Stay: Payer: Medicare Other | Attending: Hematology and Oncology

## 2016-11-14 ENCOUNTER — Other Ambulatory Visit: Payer: Self-pay | Admitting: *Deleted

## 2016-11-14 ENCOUNTER — Telehealth: Payer: Self-pay | Admitting: *Deleted

## 2016-11-14 VITALS — BP 110/77 | HR 66 | Temp 97.8°F | Resp 18 | Wt 209.0 lb

## 2016-11-14 DIAGNOSIS — Z7189 Other specified counseling: Secondary | ICD-10-CM

## 2016-11-14 DIAGNOSIS — I5022 Chronic systolic (congestive) heart failure: Secondary | ICD-10-CM

## 2016-11-14 DIAGNOSIS — J45909 Unspecified asthma, uncomplicated: Secondary | ICD-10-CM | POA: Diagnosis not present

## 2016-11-14 DIAGNOSIS — Z8049 Family history of malignant neoplasm of other genital organs: Secondary | ICD-10-CM | POA: Insufficient documentation

## 2016-11-14 DIAGNOSIS — Z803 Family history of malignant neoplasm of breast: Secondary | ICD-10-CM

## 2016-11-14 DIAGNOSIS — C9000 Multiple myeloma not having achieved remission: Secondary | ICD-10-CM

## 2016-11-14 DIAGNOSIS — Z7984 Long term (current) use of oral hypoglycemic drugs: Secondary | ICD-10-CM | POA: Diagnosis not present

## 2016-11-14 DIAGNOSIS — D649 Anemia, unspecified: Secondary | ICD-10-CM

## 2016-11-14 DIAGNOSIS — I10 Essential (primary) hypertension: Secondary | ICD-10-CM | POA: Diagnosis not present

## 2016-11-14 DIAGNOSIS — I2510 Atherosclerotic heart disease of native coronary artery without angina pectoris: Secondary | ICD-10-CM | POA: Insufficient documentation

## 2016-11-14 DIAGNOSIS — E119 Type 2 diabetes mellitus without complications: Secondary | ICD-10-CM

## 2016-11-14 DIAGNOSIS — I350 Nonrheumatic aortic (valve) stenosis: Secondary | ICD-10-CM | POA: Diagnosis not present

## 2016-11-14 DIAGNOSIS — Z79899 Other long term (current) drug therapy: Secondary | ICD-10-CM | POA: Insufficient documentation

## 2016-11-14 DIAGNOSIS — I2721 Secondary pulmonary arterial hypertension: Secondary | ICD-10-CM | POA: Diagnosis not present

## 2016-11-14 DIAGNOSIS — Z7982 Long term (current) use of aspirin: Secondary | ICD-10-CM | POA: Insufficient documentation

## 2016-11-14 DIAGNOSIS — I428 Other cardiomyopathies: Secondary | ICD-10-CM

## 2016-11-14 LAB — COMPREHENSIVE METABOLIC PANEL
ALT: 11 U/L — ABNORMAL LOW (ref 14–54)
AST: 18 U/L (ref 15–41)
Albumin: 3 g/dL — ABNORMAL LOW (ref 3.5–5.0)
Alkaline Phosphatase: 74 U/L (ref 38–126)
Anion gap: 3 — ABNORMAL LOW (ref 5–15)
BUN: 32 mg/dL — ABNORMAL HIGH (ref 6–20)
CO2: 28 mmol/L (ref 22–32)
Calcium: 9.5 mg/dL (ref 8.9–10.3)
Chloride: 102 mmol/L (ref 101–111)
Creatinine, Ser: 1.31 mg/dL — ABNORMAL HIGH (ref 0.44–1.00)
GFR calc Af Amer: 46 mL/min — ABNORMAL LOW (ref >60)
GFR calc non Af Amer: 40 mL/min — ABNORMAL LOW (ref >60)
Glucose, Bld: 129 mg/dL — ABNORMAL HIGH (ref 65–99)
Potassium: 5.3 mmol/L — ABNORMAL HIGH (ref 3.5–5.1)
Sodium: 133 mmol/L — ABNORMAL LOW (ref 135–145)
Total Bilirubin: 0.4 mg/dL (ref 0.3–1.2)
Total Protein: 8.5 g/dL — ABNORMAL HIGH (ref 6.5–8.1)

## 2016-11-14 LAB — CBC WITH DIFFERENTIAL/PLATELET
Basophils Absolute: 0 10*3/uL (ref 0–0.1)
Basophils Relative: 1 %
Eosinophils Absolute: 0.2 10*3/uL (ref 0–0.7)
Eosinophils Relative: 4 %
HCT: 25.4 % — ABNORMAL LOW (ref 35.0–47.0)
Hemoglobin: 8.2 g/dL — ABNORMAL LOW (ref 12.0–16.0)
Lymphocytes Relative: 38 %
Lymphs Abs: 2 10*3/uL (ref 1.0–3.6)
MCH: 29.9 pg (ref 26.0–34.0)
MCHC: 32.3 g/dL (ref 32.0–36.0)
MCV: 92.4 fL (ref 80.0–100.0)
Monocytes Absolute: 0.7 10*3/uL (ref 0.2–0.9)
Monocytes Relative: 13 %
Neutro Abs: 2.3 10*3/uL (ref 1.4–6.5)
Neutrophils Relative %: 44 %
Platelets: 312 10*3/uL (ref 150–440)
RBC: 2.75 MIL/uL — ABNORMAL LOW (ref 3.80–5.20)
RDW: 15.7 % — ABNORMAL HIGH (ref 11.5–14.5)
WBC: 5.2 10*3/uL (ref 3.6–11.0)

## 2016-11-14 LAB — IRON AND TIBC
Iron: 42 ug/dL (ref 28–170)
SATURATION RATIOS: 16 % (ref 10.4–31.8)
TIBC: 268 ug/dL (ref 250–450)
UIBC: 226 ug/dL

## 2016-11-14 LAB — FOLATE: FOLATE: 24 ng/mL (ref >5.9)

## 2016-11-14 LAB — RETICULOCYTES
RBC.: 2.84 MIL/uL — AB (ref 3.80–5.20)
RETIC COUNT ABSOLUTE: 51.1 10*3/uL (ref 19.0–183.0)
Retic Ct Pct: 1.8 % (ref 0.4–3.1)

## 2016-11-14 NOTE — Progress Notes (Signed)
Jillian, Watson ([id number]) Visit Report for 11/13/2016 Chief Complaint Document Details Patient Name: [name], [name]. Date of Service: 11/13/2016 1:15 PM Medical Record Number: [id number] Patient Account Number: [id number] Date of Birth/Sex: [birth year] (71 y.o. Female) Treating RN: Ahmed Prima Primary Care Provider: Lelon Huh Other Clinician: Referring Provider: Lelon Huh Treating Provider/Extender: Frann Rider in Treatment: 6 Information Obtained from: Patient Chief Complaint Patient seen for complaints of Non-Healing Wounds to both lower extremities which she's had for about 2 months with swelling. Electronic Signature(s) Signed: 11/13/2016 2:10:10 PM By: Christin Fudge MD, FACS Entered By: Christin Fudge on 11/13/2016 14:10:10 [name] ([id number]) -------------------------------------------------------------------------------- HPI Details Patient Name: [name], [name]. Date of Service: 11/13/2016 1:15 PM Medical Record Number: [id number] Patient Account Number: [id number] Date of Birth/Sex: 16-Mar-1945 (71 y.o. Female) Treating RN: Ahmed Prima Primary Care Provider: Lelon Huh Other Clinician: Referring Provider: Lelon Huh Treating Provider/Extender: Frann Rider in Treatment: 6 History of Present Illness Location: both lower extremity swelling with ulceration Quality: Patient reports experiencing a sharp pain to affected area(s). Severity: Patient states wound are getting worse. Duration: Patient has had the wound for > 2 months prior to seeking treatment at the wound center Timing: Pain in wound is constant (hurts all the time) and is worse when she elevates her legs Context: The wound would happen gradually Modifying Factors: Other treatment(s) tried include:she has been on ciprofloxacin and has been applying Silvadene ointment Associated Signs and Symptoms: Patient reports having increase swelling. HPI Description: 71 year old  patient who sees her PCP Dr. Lelon Huh was recently evaluated 10 days ago for diabetes mellitus, hypertension, CHF and hyperlipidemia. she also was noted to have ulcerations develop in her legs and she has been applying Silvadene dressings locally. In the past she has refused wound care referrals.her cardiologist Dr. Saunders Revel saw her and put her on 40 mg of furosemide daily. last hemoglobin A1c was 7.7%. she was also placed on ciprofloxacin twice daily for 7 days and a urine culture was recommended. past medical history significant for coronary artery disease, diabetes mellitus, nonischemic cardiomyopathy and pulmonary hypertension. She is also status post heart catheterization and coronary angiography, tubal ligation and breast cyst removal in the past. She has never been a smoker. Patient had arterial studies done which showed bilateral ABIs are artificially elevated due to noncompressible and calcified vessels. Triphasic waveform throughout. Right great toe TBI is elevated while the left is normal. 10/23/2016 -- the patient is rather moribund from several issues including chronic back pain and knee pain and swelling of her legs. The large necrotic area on her left lateral anterior calf was bleeding on touch after washing her leg. There was a spot which needed silver nitrate cauterization and this was done appropriately. 11/06/2016 -- she is again noted to have friable bleeding from the left lower extremity wounds and this again had to be cauterized with silver nitrate to control the bleeding as pressure itself would not do it. Electronic Signature(s) Signed: 11/13/2016 2:10:15 PM By: Christin Fudge MD, FACS Entered By: Christin Fudge on 11/13/2016 14:10:15 [name] ([id number]) -------------------------------------------------------------------------------- Physical Exam Details Patient Name: Jillian, PATA Watson. Date of Service: 11/13/2016 1:15 PM Medical Record Number: [id number] Patient  Account Number: [id number] Date of Birth/Sex: 09/11/1945 (71 y.o. Female) Treating RN: Ahmed Prima Primary Care Provider: Lelon Huh Other Clinician: Referring Provider: Lelon Huh Treating Provider/Extender: Frann Rider in Treatment: 6 Constitutional . Pulse regular. Respirations normal and unlabored. Afebrile. . Eyes Nonicteric. Reactive to light. Ears,  Nose, Mouth, and Throat Lips, teeth, and gums WNL.Marland Kitchen Moist mucosa without lesions. Neck supple and nontender. No palpable supraclavicular or cervical adenopathy. Normal sized without goiter. Respiratory WNL. No retractions.. Cardiovascular Pedal Pulses WNL. No clubbing, cyanosis or edema. Lymphatic No adneopathy. No adenopathy. No adenopathy. Musculoskeletal Adexa without tenderness or enlargement.. Digits and nails w/o clubbing, cyanosis, infection, petechiae, ischemia, or inflammatory conditions.. Integumentary (Hair, Skin) No suspicious lesions. No crepitus or fluctuance. No peri-wound warmth or erythema. No masses.Marland Kitchen Psychiatric Judgement and insight Intact.. No evidence of depression, anxiety, or agitation.. Notes the lymphedema is still persistent bilaterally but the right leg looks almost healed and we will just protect this with some foam. The left lower extremity wound on the medial side has some debris which is looking better. The wound on the lateral side again is very friable and minimal bleeding was controlled with pressure Electronic Signature(s) Signed: 11/13/2016 2:11:26 PM By: Christin Fudge MD, FACS Entered By: Christin Fudge on 11/13/2016 14:11:25 [name] ([id number]) -------------------------------------------------------------------------------- Physician Orders Details Patient Name: [name]. Date of Service: 11/13/2016 1:15 PM Medical Record Number: [id number] Patient Account Number: [id number] Date of Birth/Sex: December 08, 1945 (71 y.o. Female) Treating RN: Ahmed Prima Primary Care Provider: Lelon Huh Other Clinician: Referring Provider: Lelon Huh Treating Provider/Extender: Frann Rider in Treatment: 6 Verbal / Phone Orders: Yes Clinician: Carolyne Fiscal, Debi Read Back and Verified: Yes Diagnosis Coding Wound Cleansing Wound #1 Left,Lateral Lower Leg o Clean wound with Normal Saline. o Cleanse wound with mild soap and water o May Shower, gently pat wound dry prior to applying new dressing. Wound #2 Left,Anterior Lower Leg o Clean wound with Normal Saline. o Cleanse wound with mild soap and water o May Shower, gently pat wound dry prior to applying new dressing. Wound #3 Left,Medial Lower Leg o Clean wound with Normal Saline. o Cleanse wound with mild soap and water o May Shower, gently pat wound dry prior to applying new dressing. Wound #6 Right,Lateral Lower Leg o Clean wound with Normal Saline. o Cleanse wound with mild soap and water o May Shower, gently pat wound dry prior to applying new dressing. Anesthetic Wound #1 Left,Lateral Lower Leg o Topical Lidocaine 4% cream applied to wound bed prior to debridement - for clinic use Wound #2 Left,Anterior Lower Leg o Topical Lidocaine 4% cream applied to wound bed prior to debridement - for clinic use Wound #3 Left,Medial Lower Leg o Topical Lidocaine 4% cream applied to wound bed prior to debridement - for clinic use Wound #6 Right,Lateral Lower Leg o Topical Lidocaine 4% cream applied to wound bed prior to debridement - for clinic use Skin Barriers/Peri-Wound Care Wound #1 Left,Lateral Lower Leg o Barrier cream Wound #2 Left,Anterior Lower Leg o Barrier cream Wound #3 Left,Medial Lower Leg o Barrier cream SHEANA, BIR ([id number]) Primary Wound Dressing Wound #1 Left,Lateral Lower Leg o Santyl Ointment Wound #2 Left,Anterior Lower Leg o Silvercel Non-Adherent Wound #3 Left,Medial Lower Leg o Santyl Ointment Secondary  Dressing Wound #1 Left,Lateral Lower Leg o ABD pad o Dry Gauze o XtraSorb Wound #2 Left,Anterior Lower Leg o ABD pad o Dry Gauze o XtraSorb Wound #3 Left,Medial Lower Leg o ABD pad o Dry Gauze o XtraSorb Wound #6 Right,Lateral Lower Leg o Dry Gauze o Foam Dressing Change Frequency Wound #1 Left,Lateral Lower Leg o Change Dressing Monday, Wednesday, Friday - HHRN to change Wednesday and Fridays and pt to come to [place] on Mondays. Wound #2 Left,Anterior Lower Leg o Change  Dressing Monday, Wednesday, Friday - HHRN to change Wednesday and Fridays and pt to come to [place] on Mondays. Wound #3 Left,Medial Lower Leg o Change Dressing Monday, Wednesday, Friday - HHRN to change Wednesday and Fridays and pt to come to [place] on Mondays. Wound #6 Right,Lateral Lower Leg o Change Dressing Monday, Wednesday, Friday - HHRN to change Wednesday and Fridays and pt to come to [place] on Mondays. Follow-up Appointments Wound #1 Left,Lateral Lower Leg o Return Appointment in 1 week. Wound #2 Left,Anterior Lower Leg o Return Appointment in 1 week. Wound #3 Left,Medial Lower Leg o Return Appointment in 1 week. [name], [name] ([id number]) Wound #6 Right,Lateral Lower Leg o Return Appointment in 1 week. Edema Control Wound #1 Left,Lateral Lower Leg o Kerlix and Coban - Bilateral - from 3cm from toes to 3cm from knees o Elevate legs to the level of the heart and pump ankles as often as possible Wound #2 Left,Anterior Lower Leg o Kerlix and Coban - Bilateral - from 3cm from toes to 3cm from knees o Elevate legs to the level of the heart and pump ankles as often as possible Wound #3 Left,Medial Lower Leg o Kerlix and Coban - Bilateral - from 3cm from toes to 3cm from knees o Elevate legs to the level of the heart and pump ankles as often as possible Wound #6 Right,Lateral Lower Leg o Kerlix and  Coban - Bilateral - from 3cm from toes to 3cm from knees o Elevate legs to the level of the heart and pump ankles as often as possible Additional Orders / Instructions Wound #1 Left,Lateral Lower Leg o Increase protein intake. Wound #2 Left,Anterior Lower Leg o Increase protein intake. Wound #3 Left,Medial Lower Leg o Increase protein intake. Wound #6 Right,Lateral Lower Leg o Increase protein intake. Home Health Wound #1 [place] Nurse may visit PRN to address patientos wound care needs. o FACE TO FACE ENCOUNTER: MEDICARE and MEDICAID PATIENTS: I certify that this patient is under my care and that I had a face-to-face encounter that meets the physician face-to-face encounter requirements with this patient on this date. The encounter with the patient was in whole or in part for the following MEDICAL CONDITION: (primary reason for [place]) MEDICAL NECESSITY: I certify, that based on my findings, NURSING services are a medically necessary home health service. HOME BOUND STATUS: I certify that my clinical findings support that this patient is homebound (i.e., Due to illness or injury, pt requires aid of supportive devices such as crutches, cane, wheelchairs, walkers, the use of special transportation or the assistance of another person to leave their place of residence. There is a normal inability to leave the home and doing so requires considerable and taxing effort. Other absences are for medical reasons / religious services and are infrequent or of short duration when for other reasons). o If current dressing causes regression in wound condition, may D/C ordered dressing product/s and apply Normal Saline Moist Dressing daily until next [place] / Other MD appointment. [place] of regression in wound condition at [phone]. o Please direct any NON-WOUND related  issues/requests for orders to patient's Primary Care Physician Wound #2 [place] Nurse may visit PRN to address patientos wound care needs. JOSEFINE, FUHR ([id number]) o FACE TO FACE ENCOUNTER: MEDICARE and MEDICAID PATIENTS: I certify that this patient  is under my care and that I had a face-to-face encounter that meets the physician face-to-face encounter requirements with this patient on this date. The encounter with the patient was in whole or in part for the following MEDICAL CONDITION: (primary reason for [place]) MEDICAL NECESSITY: I certify, that based on my findings, NURSING services are a medically necessary home health service. HOME BOUND STATUS: I certify that my clinical findings support that this patient is homebound (i.e., Due to illness or injury, pt requires aid of supportive devices such as crutches, cane, wheelchairs, walkers, the use of special transportation or the assistance of another person to leave their place of residence. There is a normal inability to leave the home and doing so requires considerable and taxing effort. Other absences are for medical reasons / religious services and are infrequent or of short duration when for other reasons). o If current dressing causes regression in wound condition, may D/C ordered dressing product/s and apply Normal Saline Moist Dressing daily until next [place] / Other MD appointment. [place] of regression in wound condition at [phone]. o Please direct any NON-WOUND related issues/requests for orders to patient's Primary Care Physician Wound #3 Left,Medial Lower Leg o [place] Nurse may visit PRN to address patientos wound care needs. o FACE TO FACE ENCOUNTER: MEDICARE and MEDICAID PATIENTS: I certify that this patient is under my care and that I had a face-to-face encounter that  meets the physician face-to-face encounter requirements with this patient on this date. The encounter with the patient was in whole or in part for the following MEDICAL CONDITION: (primary reason for [place]) MEDICAL NECESSITY: I certify, that based on my findings, NURSING services are a medically necessary home health service. HOME BOUND STATUS: I certify that my clinical findings support that this patient is homebound (i.e., Due to illness or injury, pt requires aid of supportive devices such as crutches, cane, wheelchairs, walkers, the use of special transportation or the assistance of another person to leave their place of residence. There is a normal inability to leave the home and doing so requires considerable and taxing effort. Other absences are for medical reasons / religious services and are infrequent or of short duration when for other reasons). o If current dressing causes regression in wound condition, may D/C ordered dressing product/s and apply Normal Saline Moist Dressing daily until next [place] / Other MD appointment. [place] of regression in wound condition at [phone]. o Please direct any NON-WOUND related issues/requests for orders to patient's Primary Care Physician Wound #6 [place] Nurse may visit PRN to address patientos wound care needs. o FACE TO FACE ENCOUNTER: MEDICARE and MEDICAID PATIENTS: I certify that this patient is under my care and that I had a face-to-face encounter that meets the physician face-to-face encounter requirements with this patient on this date. The encounter with the patient was in whole or in part for the following MEDICAL CONDITION: (primary reason for [place]) MEDICAL NECESSITY: I certify, that based on my findings, NURSING services are a medically necessary home health service. HOME BOUND STATUS: I certify that  my clinical findings support that this patient is homebound (i.e., Due to illness or injury, pt requires aid of supportive devices such as crutches, cane, wheelchairs, walkers, the use of special transportation or the assistance of another person to leave their place of residence. There  is a normal inability to leave the home and doing so requires considerable and taxing effort. Other absences are for medical reasons / religious services and are infrequent or of short duration when for other reasons). o If current dressing causes regression in wound condition, may D/C ordered dressing product/s and apply Normal Saline Moist Dressing daily until next [place] / Other MD appointment. [place] of regression in wound condition at [phone]. o Please direct any NON-WOUND related issues/requests for orders to patient's Primary Care Physician Medications-please add to medication list. Wound #1 Left,Lateral Lower Leg o Santyl Enzymatic Ointment o Other: - Vitamin C, Zinc, Multivitamin Wound #2 Left,Anterior Lower Leg o Other: - Vitamin C, Zinc, Multivitamin ANAH, BILLARD Watson. ([id number]) Wound #3 Left,Medial Lower Leg o Santyl Enzymatic Ointment o Other: - Vitamin C, Zinc, Multivitamin Wound #6 Right,Lateral Lower Leg o Other: - Vitamin C, Zinc, Multivitamin Electronic Signature(s) Signed: 11/13/2016 4:30:10 PM By: Christin Fudge MD, FACS Signed: 11/13/2016 5:35:20 PM By: Alric Quan Entered By: Alric Quan on 11/13/2016 13:59:51 [name] ([id number]) -------------------------------------------------------------------------------- Problem List Details Patient Name: DIAMANTINA, EDINGER Watson. Date of Service: 11/13/2016 1:15 PM Medical Record Number: [id number] Patient Account Number: [id number] Date of Birth/Sex: 12/13/1945 (71 y.o. Female) Treating RN: Ahmed Prima Primary Care Provider: Lelon Huh Other Clinician: Referring  Provider: Lelon Huh Treating Provider/Extender: Frann Rider in Treatment: 6 Active Problems ICD-10 Encounter Code Description Active Date Diagnosis E11.622 Type 2 diabetes mellitus with other skin ulcer 10/02/2016 Yes I89.0 Lymphedema, not elsewhere classified 10/02/2016 Yes L97.212 Non-pressure chronic ulcer of right calf with fat layer exposed 10/02/2016 Yes L97.222 Non-pressure chronic ulcer of left calf with fat layer exposed 10/02/2016 Yes Inactive Problems Resolved Problems Electronic Signature(s) Signed: 11/13/2016 2:09:57 PM By: Christin Fudge MD, FACS Entered By: Christin Fudge on 11/13/2016 14:09:57 [name] ([id number]) -------------------------------------------------------------------------------- Progress Note Details Patient Name: [name]. Date of Service: 11/13/2016 1:15 PM Medical Record Number: [id number] Patient Account Number: [id number] Date of Birth/Sex: 1945-12-02 (71 y.o. Female) Treating RN: Ahmed Prima Primary Care Provider: Lelon Huh Other Clinician: Referring Provider: Lelon Huh Treating Provider/Extender: Frann Rider in Treatment: 6 Subjective Chief Complaint Information obtained from Patient Patient seen for complaints of Non-Healing Wounds to both lower extremities which she's had for about 2 months with swelling. History of Present Illness (HPI) The following HPI elements were documented for the patient's wound: Location: both lower extremity swelling with ulceration Quality: Patient reports experiencing a sharp pain to affected area(s). Severity: Patient states wound are getting worse. Duration: Patient has had the wound for > 2 months prior to seeking treatment at the wound center Timing: Pain in wound is constant (hurts all the time) and is worse when she elevates her legs Context: The wound would happen gradually Modifying Factors: Other treatment(s) tried include:she has been on ciprofloxacin and  has been applying Silvadene ointment Associated Signs and Symptoms: Patient reports having increase swelling. 71 year old patient who sees her PCP Dr. Lelon Huh was recently evaluated 10 days ago for diabetes mellitus, hypertension, CHF and hyperlipidemia. she also was noted to have ulcerations develop in her legs and she has been applying Silvadene dressings locally. In the past she has refused wound care referrals.her cardiologist Dr. Saunders Revel saw her and put her on 40 mg of furosemide daily. last hemoglobin A1c was 7.7%. she was also placed on ciprofloxacin twice daily for 7 days and a urine culture was recommended. past medical history significant for coronary artery disease, diabetes  mellitus, nonischemic cardiomyopathy and pulmonary hypertension. She is also status post heart catheterization and coronary angiography, tubal ligation and breast cyst removal in the past. She has never been a smoker. Patient had arterial studies done which showed bilateral ABIs are artificially elevated due to noncompressible and calcified vessels. Triphasic waveform throughout. Right great toe TBI is elevated while the left is normal. 10/23/2016 -- the patient is rather moribund from several issues including chronic back pain and knee pain and swelling of her legs. The large necrotic area on her left lateral anterior calf was bleeding on touch after washing her leg. There was a spot which needed silver nitrate cauterization and this was done appropriately. 11/06/2016 -- she is again noted to have friable bleeding from the left lower extremity wounds and this again had to be cauterized with silver nitrate to control the bleeding as pressure itself would not do it. Patient History Information obtained from Patient. Family History Cancer - Mother,Siblings, Diabetes - Mother,Siblings, Heart Disease - Mother, Hypertension - Siblings, No family history of Hereditary Spherocytosis, Kidney Disease, Lung Disease,  Seizures, Stroke, Thyroid Problems, Tuberculosis. Social History Never smoker, Marital Status - Married, Alcohol Use - Never, Drug Use - No History, Caffeine Use - Daily. [name], [name] ([id number]) Objective Constitutional Pulse regular. Respirations normal and unlabored. Afebrile. Vitals Time Taken: 1:26 PM, Height: 64 in, Weight: 206 lbs, BMI: 35.4, Pulse: 60 bpm, Respiratory Rate: 16 breaths/min, Blood Pressure: 141/66 mmHg. Eyes Nonicteric. Reactive to light. Ears, Nose, Mouth, and Throat Lips, teeth, and gums WNL.Marland Kitchen Moist mucosa without lesions. Neck supple and nontender. No palpable supraclavicular or cervical adenopathy. Normal sized without goiter. Respiratory WNL. No retractions.. Cardiovascular Pedal Pulses WNL. No clubbing, cyanosis or edema. Lymphatic No adneopathy. No adenopathy. No adenopathy. Musculoskeletal Adexa without tenderness or enlargement.. Digits and nails w/o clubbing, cyanosis, infection, petechiae, ischemia, or inflammatory conditions.Marland Kitchen Psychiatric Judgement and insight Intact.. No evidence of depression, anxiety, or agitation.. General Notes: the lymphedema is still persistent bilaterally but the right leg looks almost healed and we will just protect this with some foam. The left lower extremity wound on the medial side has some debris which is looking better. The wound on the lateral side again is very friable and minimal bleeding was controlled with pressure Integumentary (Hair, Skin) No suspicious lesions. No crepitus or fluctuance. No peri-wound warmth or erythema. No masses.. Wound #1 status is Open. Original cause of wound was Gradually Appeared. The wound is located on the Left,Lateral Lower Leg. The wound measures 8cm length x 6cm width x 0.3cm depth; 37.699cm^2 area and 11.31cm^3 volume. There is no tunneling or undermining noted. There is a large amount of serosanguineous drainage noted. The wound margin is distinct with the outline attached  to the wound base. There is small (1-33%) red granulation within the wound bed. There is a large (67-100%) amount of necrotic tissue within the wound bed including Adherent Slough. The periwound skin appearance exhibited: Maceration, Erythema. The surrounding wound skin color is noted with erythema which is circumferential. Periwound temperature was noted as No Abnormality. [name], [name] ([id number]) Wound #2 status is Open. Original cause of wound was Gradually Appeared. The wound is located on the Left,Anterior Lower Leg. The wound measures 5cm length x 2.3cm width x 0.1cm depth; 9.032cm^2 area and 0.903cm^3 volume. There is no tunneling or undermining noted. There is a large amount of serous drainage noted. The wound margin is distinct with the outline attached to the wound base. There is small (1-33%) pink  granulation within the wound bed. There is a large (67-100%) amount of necrotic tissue within the wound bed including Eschar and Adherent Slough. The periwound skin appearance exhibited: Maceration, Erythema. The surrounding wound skin color is noted with erythema which is circumferential. Periwound temperature was noted as No Abnormality. The periwound has tenderness on palpation. Wound #3 status is Open. Original cause of wound was Gradually Appeared. The wound is located on the Left,Medial Lower Leg. The wound measures 2.5cm length x 2cm width x 0.1cm depth; 3.927cm^2 area and 0.393cm^3 volume. There is no tunneling or undermining noted. There is a large amount of serous drainage noted. The wound margin is distinct with the outline attached to the wound base. There is no granulation within the wound bed. There is a large (67-100%) amount of necrotic tissue within the wound bed including Eschar and Adherent Slough. The periwound skin appearance exhibited: Maceration, Erythema. The surrounding wound skin color is noted with erythema which is circumferential. Periwound temperature was noted  as No Abnormality. The periwound has tenderness on palpation. Wound #6 status is Open. Original cause of wound was Gradually Appeared. The wound is located on the Right,Lateral Lower Leg. The wound measures 0.2cm length x 0.2cm width x 0.1cm depth; 0.031cm^2 area and 0.003cm^3 volume. There is no tunneling or undermining noted. There is a large amount of serous drainage noted. The wound margin is distinct with the outline attached to the wound base. There is no granulation within the wound bed. There is a large (67-100%) amount of necrotic tissue within the wound bed including Adherent Slough. The periwound skin appearance exhibited: Maceration, Erythema. The surrounding wound skin color is noted with erythema which is circumferential. Periwound temperature was noted as No Abnormality. The periwound has tenderness on palpation. 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 Cleanse wound with mild soap and water May Shower, gently pat wound dry prior to applying new dressing. Wound #2 Left,Anterior Lower Leg: Clean wound with Normal Saline. Cleanse wound with mild soap and water May Shower, gently pat wound dry prior to applying new dressing. Wound #3 Left,Medial Lower Leg: Clean wound with Normal Saline. Cleanse wound with mild soap and water May Shower, gently pat wound dry prior to applying new dressing. [name], [name] ([id number]) Wound #6 Right,Lateral Lower Leg: Clean wound with Normal Saline. Cleanse wound with mild soap and water May Shower, gently pat wound dry prior to applying new dressing. Anesthetic: Wound #1 Left,Lateral Lower Leg: Topical Lidocaine  4% cream applied to wound bed prior to debridement - for clinic use Wound #2 Left,Anterior Lower Leg: Topical Lidocaine 4% cream applied to wound bed prior to debridement - for clinic use Wound #3 Left,Medial Lower Leg: Topical Lidocaine 4% cream applied to wound bed prior to debridement - for clinic use Wound #6 Right,Lateral Lower Leg: Topical Lidocaine 4% cream applied to wound bed prior to debridement - for clinic use Skin Barriers/Peri-Wound Care: Wound #1 Left,Lateral Lower Leg: Barrier cream Wound #2 Left,Anterior Lower Leg: Barrier cream Wound #3 Left,Medial Lower Leg: Barrier cream Primary Wound Dressing: Wound #1 Left,Lateral Lower Leg: Santyl Ointment Wound #2 Left,Anterior Lower Leg: [place] Non-Adherent Wound #3 Left,Medial Lower Leg: Santyl Ointment Secondary Dressing: Wound #1 Left,Lateral  Lower Leg: ABD pad Dry Gauze XtraSorb Wound #2 Left,Anterior Lower Leg: ABD pad Dry Gauze XtraSorb Wound #3 Left,Medial Lower Leg: ABD pad Dry Gauze XtraSorb Wound #6 Right,Lateral Lower Leg: Dry Gauze Foam Dressing Change Frequency: Wound #1 Left,Lateral Lower Leg: Change Dressing Monday, Wednesday, Friday - HHRN to change Wednesday and Fridays and pt to come to [facility] on Mondays. Wound #2 Left,Anterior Lower Leg: Change Dressing Monday, Wednesday, Friday - HHRN to change Wednesday and Fridays and pt to come to [facility] on Mondays. Wound #3 Left,Medial Lower Leg: Change Dressing Monday, Wednesday, Friday - HHRN to change Wednesday and Fridays and pt to come to [facility] on Mondays. Wound #6 Right,Lateral Lower Leg: Change Dressing Monday, Wednesday, Friday - HHRN to change Wednesday and Fridays and pt to come to [facility] on Mondays. Follow-up Appointments: Wound #1 Left,Lateral Lower Leg: Return Appointment in 1 week. Wound #2 Left,Anterior Lower Leg: [name], [name] ([id number]) Return Appointment in 1 week. Wound #3  Left,Medial Lower Leg: Return Appointment in 1 week. Wound #6 Right,Lateral Lower Leg: Return Appointment in 1 week. Edema Control: Wound #1 Left,Lateral Lower Leg: Kerlix and Coban - Bilateral - from 3cm from toes to 3cm from knees Elevate legs to the level of the heart and pump ankles as often as possible Wound #2 Left,Anterior Lower Leg: Kerlix and Coban - Bilateral - from 3cm from toes to 3cm from knees Elevate legs to the level of the heart and pump ankles as often as possible Wound #3 Left,Medial Lower Leg: Kerlix and Coban - Bilateral - from 3cm from toes to 3cm from knees Elevate legs to the level of the heart and pump ankles as often as possible Wound #6 Right,Lateral Lower Leg: Kerlix and Coban - Bilateral - from 3cm from toes to 3cm from knees Elevate legs to the level of the heart and pump ankles as often as possible Additional Orders / Instructions: Wound #1 Left,Lateral Lower Leg: Increase protein intake. Wound #2 Left,Anterior Lower Leg: Increase protein intake. Wound #3 Left,Medial Lower Leg: Increase protein intake. Wound #6 Right,Lateral Lower Leg: Increase protein intake. Home Health: Wound #1 Left,Lateral Lower Leg: [place] Nurse may visit PRN to address patient s wound care needs. FACE TO FACE ENCOUNTER: MEDICARE and MEDICAID PATIENTS: I certify that this patient is under my care and that I had a face-to-face encounter that meets the physician face-to-face encounter requirements with this patient on this date. The encounter with the patient was in whole or in part for the following MEDICAL CONDITION: (primary reason for [place]) MEDICAL NECESSITY: I certify, that based on my findings, NURSING services are a medically necessary home health service. HOME BOUND STATUS: I certify that my clinical findings support that this patient is homebound (i.e., Due to illness or injury, pt requires aid of supportive devices such as  crutches, cane, wheelchairs, walkers, the use of special transportation or the assistance of another person to leave their place of residence. There is a normal inability to leave the home and doing so requires considerable and taxing effort. Other absences are for medical reasons / religious services and are infrequent or of short duration when for other reasons). If current dressing causes regression in wound condition, may D/C ordered dressing product/s and apply Normal Saline Moist Dressing daily until next [place] / Other MD appointment. [place] of regression in wound condition at [phone]. Please direct any NON-WOUND related  issues/requests for orders to patient's Primary Care Physician Wound #2 Left,Anterior Lower Leg: [place] Nurse may visit PRN to address patient s wound care needs. FACE TO FACE ENCOUNTER: MEDICARE and MEDICAID PATIENTS: I certify that this patient is under my care and that I had a face-to-face encounter that meets the physician face-to-face encounter requirements with this patient on this date. The encounter with the patient was in whole or in part for the following MEDICAL CONDITION: (primary reason for [place]) MEDICAL NECESSITY: I certify, that based on my findings, NURSING services are a medically necessary home health service. HOME BOUND STATUS: I certify that my clinical findings support that this patient is homebound (i.e., Due to illness or injury, pt requires aid of supportive devices such as crutches, cane, wheelchairs, walkers, the use of special transportation or the assistance of another person to leave their place of residence. There is a normal inability to leave the home and doing so requires considerable and taxing effort. Other absences are for medical reasons / religious services and are infrequent or of short duration when for other reasons). If current dressing causes  regression in wound condition, may D/C ordered dressing product/s and apply Normal Saline Moist Dressing daily until next [place] / Other MD appointment. [place] of regression in wound condition at [phone]. Please direct any NON-WOUND related issues/requests for orders to patient's Primary Care Physician AMA, MCMASTER ([id number]) Wound #3 Left,Medial Lower Leg: [place] Nurse may visit PRN to address patient s wound care needs. FACE TO FACE ENCOUNTER: MEDICARE and MEDICAID PATIENTS: I certify that this patient is under my care and that I had a face-to-face encounter that meets the physician face-to-face encounter requirements with this patient on this date. The encounter with the patient was in whole or in part for the following MEDICAL CONDITION: (primary reason for [place]) MEDICAL NECESSITY: I certify, that based on my findings, NURSING services are a medically necessary home health service. HOME BOUND STATUS: I certify that my clinical findings support that this patient is homebound (i.e., Due to illness or injury, pt requires aid of supportive devices such as crutches, cane, wheelchairs, walkers, the use of special transportation or the assistance of another person to leave their place of residence. There is a normal inability to leave the home and doing so requires considerable and taxing effort. Other absences are for medical reasons / religious services and are infrequent or of short duration when for other reasons). If current dressing causes regression in wound condition, may D/C ordered dressing product/s and apply Normal Saline Moist Dressing daily until next [place] / Other MD appointment. [place] of regression in wound condition at [phone]. Please direct any NON-WOUND related issues/requests for orders to patient's Primary Care Physician Wound #6 Right,Lateral Lower  Leg: [place] Nurse may visit PRN to address patient s wound care needs. FACE TO FACE ENCOUNTER: MEDICARE and MEDICAID PATIENTS: I certify that this patient is under my care and that I had a face-to-face encounter that meets the physician face-to-face encounter requirements with this patient on this date. The encounter with the patient was in whole or in part for the following MEDICAL CONDITION: (primary reason for [place]) MEDICAL NECESSITY: I certify, that based on my findings, NURSING services are a medically necessary home health service. HOME BOUND STATUS: I certify that my clinical findings support that this  patient is homebound (i.e., Due to illness or injury, pt requires aid of supportive devices such as crutches, cane, wheelchairs, walkers, the use of special transportation or the assistance of another person to leave their place of residence. There is a normal inability to leave the home and doing so requires considerable and taxing effort. Other absences are for medical reasons / religious services and are infrequent or of short duration when for other reasons). If current dressing causes regression in wound condition, may D/C ordered dressing product/s and apply Normal Saline Moist Dressing daily until next [place] / Other MD appointment. [place] of regression in wound condition at [phone]. Please direct any NON-WOUND related issues/requests for orders to patient's Primary Care Physician Medications-please add to medication list.: Wound #1 Left,Lateral Lower Leg: Santyl Enzymatic Ointment Other: - Vitamin C, Zinc, Multivitamin Wound #2 Left,Anterior Lower Leg: Other: - Vitamin C, Zinc, Multivitamin Wound #3 Left,Medial Lower Leg: Santyl Enzymatic Ointment Other: - Vitamin C, Zinc, Multivitamin Wound #6 Right,Lateral Lower Leg: Other: - Vitamin C, Zinc, Multivitamin After review today, I have  recommended: 1. Foam to the right lower extremity and Santyl ointment to the left lower extremity to be wrapped with Kerlix and Coban to be changed 3 times a week 2. Elevation and exercise discussed with her in great detail 3. her husband who is the caregiver and is at the bedside, has had several questions which I have answered to his satisfaction. 4. Regular visits to the wound center. JAELEY, WIKER ([id number]) they have been instructed that if at any stage the wound bleeds significantly pressure would be the best method to control this. Electronic Signature(s) Signed: 11/13/2016 2:12:28 PM By: Christin Fudge MD, FACS Entered By: Christin Fudge on 11/13/2016 14:12:28 [name] ([id number]) -------------------------------------------------------------------------------- ROS/PFSH Details Patient Name: TIMIRA, BIEDA Watson. Date of Service: 11/13/2016 1:15 PM Medical Record Number: [id number] Patient Account Number: [id number] Date of Birth/Sex: [birth year] (71 y.o. Female) Treating RN: Carolyne Fiscal, Debi Primary Care Provider: Lelon Huh Other Clinician: Referring Provider: Lelon Huh Treating Provider/Extender: Frann Rider in Treatment: 6 Information Obtained From Patient Wound History Do you currently have one or more open woundso Yes How many open wounds do you currently haveo 6 Approximately how long have you had your woundso 2 months How have you been treating your wound(s) until nowo [place] Has your wound(s) ever healed and then re-openedo No Have you had any lab work done in the past montho No Have you tested positive for an antibiotic resistant organism (MRSA, VRE)o No Have you tested positive for osteomyelitis (bone infection)o No Have you had any tests for circulation on your legso Yes Where was the test doneo [facility] Have you had other problems associated with your woundso Infection, Swelling Respiratory Medical History: Positive for:  Asthma Cardiovascular Medical History: Positive for: Congestive Heart Failure; Coronary Artery Disease; Hypertension Endocrine Medical History: Positive for: Type II Diabetes Time with diabetes: a long time Treated with: Oral agents Blood sugar tested every day: No Musculoskeletal Medical History: Positive for: Osteoarthritis Immunizations Pneumococcal Vaccine: Received Pneumococcal Vaccination: Yes Implantable Devices Family and Social History Cancer: Yes - Mother,Siblings; Diabetes: Yes - Mother,Siblings; Heart Disease: Yes - Mother; Hereditary Spherocytosis: No; Hypertension: Yes - Siblings; Kidney Disease: No; Lung Disease: No; Seizures: No; Stroke: No; Thyroid Problems: No; SAACHI, ZALE Watson. ([id number]) Tuberculosis: No; Never smoker; Marital Status - Married; Alcohol Use: Never; Drug Use: No History; Caffeine Use: Daily; Financial Concerns: No; Food, Clothing or Shelter Needs: No;  Support System Lacking: No; Transportation Concerns: No; Advanced Directives: No; Patient does not want information on Advanced Directives; Do not resuscitate: No; Living Will: No; Medical Power of Attorney: No Physician Affirmation I have reviewed and agree with the above information. Electronic Signature(s) Signed: 11/13/2016 4:30:10 PM By: Christin Fudge MD, FACS Signed: 11/13/2016 5:35:20 PM By: Alric Quan Entered By: Christin Fudge on 11/13/2016 14:10:23 [name] ([id number]) -------------------------------------------------------------------------------- [place] Details Patient Name: ZIERRA, LAROQUE Watson. Date of Service: 11/13/2016 Medical Record Number: [id number] Patient Account Number: [id number] Date of Birth/Sex: 1945/01/27 (71 y.o. 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 Facility Procedures CPT4 Code: [id number] Description: [phone] - WOUND CARE VISIT-LEV 5 EST PT Modifier: Quantity: 1 Physician Procedures CPT4 Code: [id number] Description: 53646 - WC PHYS LEVEL 3 - EST PT ICD-10 Diagnosis Description E11.622 Type 2 diabetes mellitus with other skin ulcer I89.0 Lymphedema, not elsewhere classified L97.212 Non-pressure chronic ulcer of right calf with fat layer exp L97.222  Non-pressure chronic ulcer of left calf with fat layer expo Modifier: osed sed Quantity: 1 Electronic Signature(s) Signed: 11/13/2016 5:10:39 PM By: Alric Quan Previous Signature: 11/13/2016 2:12:40 PM Version By: Christin Fudge MD, FACS Entered By: Alric Quan on 11/13/2016 17:10:39

## 2016-11-14 NOTE — Progress Notes (Signed)
Pt in for follow up, reports going to wound clinic for lower bilateral leg ulcers, home care nurse come 2xweek to change dressing and assess.  Pt reports, no longer having asthma complications.

## 2016-11-14 NOTE — Progress Notes (Signed)
[facility] day:  11/14/2016   Chief Complaint: [name] is a 71 y.o. female with stage II multiple myeloma who is seen for 4 month assessment.  HPI: The patient was last seen in the medical oncology clinic on 07/14/2016.  At that time, she felt fine.  She had slowly healing lower leg wounds.  Creatinine was 1.26 on 06/22/2016.  Hemoglobin was 9.7.  CBC on 09/14/2016 revealed a hematocrit 25.9, hemoglobin 8.8, platelets 247,000, white count 6000 with an ANC of 3300.  BUN was 48 with a creatinine of 1.47.  M-spike was 1.6 gm/dL.  Creatinine was 1.08 on 11/08/2016.  During the interim, patient is feeling "great". She has been seen in consult by the wound care center for wounds to her lower extremities. She has home health nursing services several times a week for dressing changes. Patient denies other physical complaints today. She is eating well, with no significant weight loss noted. Patient notes that her diet is rich in iron.  Patient pale in appearance.    Past Medical History:  Diagnosis Date  . Aortic stenosis   . Asthma   . Coronary artery disease   . Diabetes mellitus without complication ([place])   . History of measles   . Hypertension   . Nonischemic cardiomyopathy ([place])   . Pulmonary hypertension ([place])     Past Surgical History:  Procedure Laterality Date  . Cyst(solitary) of breast:removed    . TUBAL LIGATION      Family History  Problem Relation Age of Onset  . Cancer Mother        Uterine cancer  . Diabetes Mother   . Heart disease Mother   . Heart failure Mother   . Parkinson's disease Father   . Cancer Sister        breast cancer  . Diabetes Brother        Non-insulin Dependent Diabetes Mellitus  . Mesothelioma Brother   . Hypertension Sister   . Diabetes Sister        Non-insulin dependent Diabetes Mellitus    Social History:  reports that  has never smoked. she has never used smokeless tobacco. She  reports that she does not drink alcohol or use drugs.  She is originally from [place]  Her husband was born in [place], [place].  They met in middle school in [place].  She has been married to her husband x 52 years.  The patient is accompanied by her husband, [name], today.  Allergies:  Allergies  Allergen Reactions  . Hydrochlorothiazide     Leg Cramps  . Penicillins Rash    Current Medications: Current Outpatient Medications  Medication Sig Dispense Refill  . amLODipine (NORVASC) 5 MG tablet TAKE 1 TABLET (5 MG TOTAL) BY MOUTH DAILY. 30 tablet 5  . aspirin EC 81 MG tablet Take 1 tablet (81 mg total) by mouth daily. 90 tablet 3  . fluticasone furoate-vilanterol (BREO ELLIPTA) 200-25 MCG/INH AEPB Inhale 1 puff into the lungs daily. FOR ASTHMA J45.20 1 each 5  . HYDROcodone-acetaminophen (NORCO) 10-325 MG tablet Take 1 tablet by mouth every 8 (eight) hours as needed. 90 tablet 0  . lisinopril (PRINIVIL,ZESTRIL) 20 MG tablet Take 1 tablet (20 mg total) daily by mouth. 90 tablet 3  . lovastatin (MEVACOR) 20 MG tablet Take 1 tablet (20 mg total) by mouth at bedtime. 90 tablet 4  . meloxicam (MOBIC) 15 MG tablet Take 1  tablet (15 mg total) by mouth daily as needed for pain. Take with food 90 tablet 4  . metFORMIN (GLUCOPHAGE-XR) 750 MG 24 hr tablet Take 2 tablets by mouth daily.  4  . spironolactone (ALDACTONE) 25 MG tablet TAKE 1 TABLET (25 MG TOTAL) BY MOUTH DAILY. 30 tablet 12  . furosemide (LASIX) 40 MG tablet Take 1 tablet (40 mg total) by mouth daily. 90 tablet 3  . metoprolol (LOPRESSOR) 50 MG tablet Take 1 tablet (50 mg total) by mouth 2 (two) times daily. 180 tablet 3  . potassium chloride (K-DUR) 10 MEQ tablet Take 1 tablet (10 mEq total) by mouth daily. 30 tablet 3  . silver sulfADIAZINE (SILVADENE) 1 % cream APPLY TO AFFECTED AREA DAILY (Patient not taking: Reported on 11/14/2016) 85 g 5  . traZODone (DESYREL) 100 MG tablet TAKE 1 TABLET BY MOUTH EVERY NIGHT AT  BEDTIME AS NEEDED FOR INSOMNIA (Patient not taking: Reported on 11/14/2016) 90 tablet 4   No current facility-administered medications for this visit.     Review of Systems:  GENERAL:  "I'm fine".  No fevers or sweats.  Weight up 5 pounds since last visit. PERFORMANCE STATUS (ECOG):  1 HEENT:  Chronic vision changes.  No visual changes, runny nose, sore throat, mouth sores or tenderness. Lungs:  Shortness of breath.  No cough.  No hemoptysis. Cardiac:  No chest pain, palpitations, orthopnea, or PND. GI:  Eating well.  No nausea, vomiting, diarrhea, constipation, melena or hematochezia.  Colonoscopy up-to-date. GU:  No urgency, dysuria, or hematuria. Musculoskeletal:  Lower back pain.  No joint pain.  No muscle tenderness. Extremities:  Lower extremity swelling.  Legs and feet hurt (chronic).   Skin:  Healing leg wounds.  Right leg improving.  Left leg "working on it".  No other rashes or skin changes. Neuro:  No headache, numbness or weakness, balance or coordination issues. Endocrine:  Diabetes.  No thyroid issues, hot flashes or night sweats. Psych:  Anxiety.  No mood changes, depression. Pain:  Lower back pain.  Review of systems:  All other systems reviewed and found to be negative.  Physical Exam: Blood pressure 110/77, pulse 66, temperature 97.8 F (36.6 C), temperature source Tympanic, resp. rate 18, weight 209 lb (94.8 kg). GENERAL:  Slightly fatigued appearing woman sitting in a wheelchair in the exam room in no acute distress.  MENTAL STATUS:  Alert and oriented to person, place and time. HEAD:  Pearline Cables styled hair.  Normocephalic, atraumatic, face symmetric, no Cushingoid features. EYES:Glasses. Blue eyes.  Pupils equal round and reactive to light and accomodation. No conjunctivitis or scleral icterus. FWY:OVZCHYIFOY clear without lesion. Tonguenormal. Mucous membranes moist. RESPIRATORY:Clear to auscultationwithout rales, wheezes or  rhonchi. CARDIOVASCULAR:Regular rate andrhythmwithout murmur, rub or gallop. ABDOMEN:Soft, non-tender, with active bowel sounds, and no hepatosplenomegaly. No masses. SKIN: Pale.  No rashes, ulcers or lesions. EXTREMITIES: Chronic lower extremity edema. Legs wrapped.  No skin discoloration or tenderness. No palpable cords. LYMPHNODES: No palpable cervical, supraclavicular, axillary or inguinal adenopathy  NEUROLOGICAL: Unremarkable. PSYCH: Appropriate.   Appointment on 11/14/2016  Component Date Value Ref Range Status  . Sodium 11/14/2016 133* 135 - 145 mmol/L Final  . Potassium 11/14/2016 5.3* 3.5 - 5.1 mmol/L Final  . Chloride 11/14/2016 102  101 - 111 mmol/L Final  . CO2 11/14/2016 28  22 - 32 mmol/L Final  . Glucose, Bld 11/14/2016 129* 65 - 99 mg/dL Final  . BUN 11/14/2016 32* 6 - 20 mg/dL Final  . Creatinine, Ser 11/14/2016  1.31* 0.44 - 1.00 mg/dL Final  . Calcium 11/14/2016 9.5  8.9 - 10.3 mg/dL Final  . Total Protein 11/14/2016 8.5* 6.5 - 8.1 g/dL Final  . Albumin 11/14/2016 3.0* 3.5 - 5.0 g/dL Final  . AST 11/14/2016 18  15 - 41 U/L Final  . ALT 11/14/2016 11* 14 - 54 U/L Final  . Alkaline Phosphatase 11/14/2016 74  38 - 126 U/L Final  . Total Bilirubin 11/14/2016 0.4  0.3 - 1.2 mg/dL Final  . GFR calc non Af Amer 11/14/2016 40* >60 mL/min Final  . GFR calc Af Amer 11/14/2016 46* >60 mL/min Final   Comment: (NOTE) The eGFR has been calculated using the CKD EPI equation. This calculation has not been validated in all clinical situations. eGFR's persistently <60 mL/min signify possible Chronic Kidney Disease.   . Anion gap 11/14/2016 3* 5 - 15 Final  . WBC 11/14/2016 5.2  3.6 - 11.0 K/uL Final  . RBC 11/14/2016 2.75* 3.80 - 5.20 MIL/uL Final  . Hemoglobin 11/14/2016 8.2* 12.0 - 16.0 g/dL Final  . HCT 11/14/2016 25.4* 35.0 - 47.0 % Final  . MCV 11/14/2016 92.4  80.0 - 100.0 fL Final  . MCH 11/14/2016 29.9  26.0 - 34.0 pg Final  . MCHC 11/14/2016 32.3  32.0  - 36.0 g/dL Final  . RDW 11/14/2016 15.7* 11.5 - 14.5 % Final  . Platelets 11/14/2016 312  150 - 440 K/uL Final  . Neutrophils Relative % 11/14/2016 44  % Final  . Neutro Abs 11/14/2016 2.3  1.4 - 6.5 K/uL Final  . Lymphocytes Relative 11/14/2016 38  % Final  . Lymphs Abs 11/14/2016 2.0  1.0 - 3.6 K/uL Final  . Monocytes Relative 11/14/2016 13  % Final  . Monocytes Absolute 11/14/2016 0.7  0.2 - 0.9 K/uL Final  . Eosinophils Relative 11/14/2016 4  % Final  . Eosinophils Absolute 11/14/2016 0.2  0 - 0.7 K/uL Final  . Basophils Relative 11/14/2016 1  % Final  . Basophils Absolute 11/14/2016 0.0  0 - 0.1 K/uL Final    Assessment:  [name] is a 71 y.o. female with stage II myeloma.  SPEP on 01/25/2016 revealed a 0.9 gm/dL IgG monoclonal gammopathy.  She denied any bone pain or recurrent infections.  Calcium and creatinine were normal.  Urinalysis revealed 30+ protein.  Work-up on 02/14/2016 revealed a hematocrit of 29.1, hemoglobin 9.4, MCV 84.6, platelets 225,000, white count 5700 with an Riddle of 3700.  SPEP revealed a 1.9 g/dL monoclonal spike.  Kappa free light chains were 237.2, lambda free light chains 38.7 with a ratio of 6.13 (0.26-1.65).  Beta 2-microglobulin was 3.9 (0.6-2.4).  BMP revealed a creatinine of 0.95 and calcium 9.0.  24 hour UPEP revealed 31 mg/24 hours of an IgG monoclonal protein with kappa light chain specificity.  Bence Jones protein was positive, kappa type.  Bone marrow on 02/29/2016 revealed a hypercellular marrow for age with plasmacytosis (plasma cells 10%).  There was trilineage hematopoiesis. Immunohistochemical stains highlighted the plasma cell component which generally showed a polyclonal staining pattern for kappa and lambda light chain although there was kappa light chain excess especially in several plasma cell clusters. The features were most suggestive of early plasma cell dyscrasia/neoplasm.  Cytogenetics were normal (28, XX).  Bone survey on 03/22/2016  revealed no focal lesion or acute bony abnormality.  PET scan on 03/31/2016 revealed no significant abnormal hypermetabolic activity is identified in the head, neck, chest, abdomen, pelvis, or skeleton.  SPEP has  been followed: 0.9 gm/dL on 01/25/2016, 1.9 on 02/14/2016, 1.7 on 03/24/2016, 0.7 on 05/16/2016, 0.8 on 07/14/2016, and 1.6 on 09/14/2016.   She has a normocytic anemia.  Work-up on 01/25/2016 and 02/14/2016 revealed a normal ferritin (90), iron studies, folate, and thyroid function tests.  B12 was 312 (low normal) on 01/25/2016, but with a normal MMA on 02/14/2016 thus ruling out B12 deficiency.  Reticulocyte count is inappropiately low.  Last colonoscopy was 2 years ago.  Guaiac cards were negative x 3 in 04/2016.  Cardiac catheterization on 03/07/2016 revealed non-obstructive coronary artery disease.  She has severe pulmonary artery hypertension with moderate to severely elevated right heart filling pressures and moderately elevated left heart filling pressures.   Symptomatically, she feels fine.  She has slowly healing lower leg wounds. She is under the care of the wound care center.  Creatinine is 1.31.  Hemoglobin is 8.2. Potassium 5.3  Plan: 1.  Labs today:  CBC with diff, CMP, SPEP, FLCA, iron studies, B12, folate, retic. 2.  Discuss elevated potassium of 5.3. Patient is on potassium sparing diuretic therapy. Will communicate with Dr. Saunders Revel (cardiology). 3.  Discuss progressive anemia. Plan for ongoing close surveillance.  Review indications for treatment of both anemia and myeloma. 4.  RTC in 1 month for MD assessment and labs (CBC with diff, CMP).   Honor Loh, NP  11/14/2016, 11:35 AM   I saw and evaluated the patient, participating in the key portions of the service and reviewing pertinent diagnostic studies and records.  I reviewed the nurse practitioner's note and agree with the findings and the plan.  The assessment and plan were discussed with the patient.  Several  questions were asked by the patient and answered.   Nolon Stalls, MD 11/14/2016,11:35 AM

## 2016-11-14 NOTE — Telephone Encounter (Signed)
-----   Message from Nelva Bush, MD sent at 11/14/2016 11:49 AM EST ----- Thank you for the update. Anderson Malta, can you touch base with Ms. Brenton and have her stop her potassium chloride supplement. She should continue her current dose of spironolactone and have a repeat BMP in ~1 month.  Gerald Stabs  ----- Message ----- From: Karen Kitchens, NP Sent: 11/14/2016  11:40 AM To: Nelva Bush, MD  Dr. Mike Gip asked that I send these labs to you. K+ is 5.3. Patient is on Aldactone. Just wanted to ensure that you were aware.   Thanks, Honor Loh, FNP-C

## 2016-11-14 NOTE — Telephone Encounter (Signed)
Called and patient not home. Spoke with husband, ok per DPR. He verbalized understanding to stop potassium and go to [facility] for repeat lab work on December 14, 2016 for repeat lab work. He will let the patient know about these changes and lab work.

## 2016-11-15 DIAGNOSIS — L97222 Non-pressure chronic ulcer of left calf with fat layer exposed: Secondary | ICD-10-CM | POA: Diagnosis not present

## 2016-11-15 DIAGNOSIS — E11622 Type 2 diabetes mellitus with other skin ulcer: Secondary | ICD-10-CM | POA: Diagnosis not present

## 2016-11-15 DIAGNOSIS — J45909 Unspecified asthma, uncomplicated: Secondary | ICD-10-CM | POA: Diagnosis not present

## 2016-11-15 DIAGNOSIS — I2510 Atherosclerotic heart disease of native coronary artery without angina pectoris: Secondary | ICD-10-CM | POA: Diagnosis not present

## 2016-11-15 DIAGNOSIS — Z79891 Long term (current) use of opiate analgesic: Secondary | ICD-10-CM | POA: Diagnosis not present

## 2016-11-15 DIAGNOSIS — Z7984 Long term (current) use of oral hypoglycemic drugs: Secondary | ICD-10-CM | POA: Diagnosis not present

## 2016-11-15 DIAGNOSIS — L97211 Non-pressure chronic ulcer of right calf limited to breakdown of skin: Secondary | ICD-10-CM | POA: Diagnosis not present

## 2016-11-15 DIAGNOSIS — I509 Heart failure, unspecified: Secondary | ICD-10-CM | POA: Diagnosis not present

## 2016-11-15 DIAGNOSIS — I110 Hypertensive heart disease with heart failure: Secondary | ICD-10-CM | POA: Diagnosis not present

## 2016-11-15 DIAGNOSIS — I272 Pulmonary hypertension, unspecified: Secondary | ICD-10-CM | POA: Diagnosis not present

## 2016-11-15 DIAGNOSIS — L97212 Non-pressure chronic ulcer of right calf with fat layer exposed: Secondary | ICD-10-CM | POA: Diagnosis not present

## 2016-11-15 DIAGNOSIS — I429 Cardiomyopathy, unspecified: Secondary | ICD-10-CM | POA: Diagnosis not present

## 2016-11-15 DIAGNOSIS — M1990 Unspecified osteoarthritis, unspecified site: Secondary | ICD-10-CM | POA: Diagnosis not present

## 2016-11-15 DIAGNOSIS — Z4800 Encounter for change or removal of nonsurgical wound dressing: Secondary | ICD-10-CM | POA: Diagnosis not present

## 2016-11-15 LAB — PROTEIN ELECTROPHORESIS, SERUM
A/G Ratio: 0.5 — ABNORMAL LOW (ref 0.7–1.7)
Albumin ELP: 2.8 g/dL — ABNORMAL LOW (ref 2.9–4.4)
Alpha-1-Globulin: 0.3 g/dL (ref 0.0–0.4)
Alpha-2-Globulin: 0.9 g/dL (ref 0.4–1.0)
Beta Globulin: 1 g/dL (ref 0.7–1.3)
Gamma Globulin: 3.1 g/dL — ABNORMAL HIGH (ref 0.4–1.8)
Globulin, Total: 5.2 g/dL — ABNORMAL HIGH (ref 2.2–3.9)
M-Spike, %: 1.1 g/dL — ABNORMAL HIGH
Total Protein ELP: 8 g/dL (ref 6.0–8.5)

## 2016-11-15 LAB — KAPPA/LAMBDA LIGHT CHAINS
Kappa free light chain: 343.1 mg/L — ABNORMAL HIGH (ref 3.3–19.4)
Kappa, lambda light chain ratio: 6.74 — ABNORMAL HIGH (ref 0.26–1.65)
Lambda free light chains: 50.9 mg/L — ABNORMAL HIGH (ref 5.7–26.3)

## 2016-11-16 NOTE — Progress Notes (Signed)
Jillian, Watson ([id number]) Visit Report for 11/13/2016 Arrival Information Details Patient Name: [name], [name]. Date of Service: 11/13/2016 1:15 PM Medical Record Number: [id number] Patient Account Number: [id number] Date of Birth/Sex: [birth year] (71 y.o. Female) Treating RN: Ahmed Prima Primary Care Gergory Biello: Lelon Huh Other Clinician: Referring Kinya Meine: Lelon Huh Treating Mamadou Breon/Extender: Frann Rider in Treatment: 6 Visit Information History Since Last Visit All ordered tests and consults were completed: No Patient Arrived: Wheel Chair Added or deleted any medications: No Arrival Time: 13:23 Any new allergies or adverse reactions: No Accompanied By: husband Had a fall or experienced change in No Transfer Assistance: EasyPivot Patient activities of daily living that may affect Lift risk of falls: Patient Identification Verified: Yes Signs or symptoms of abuse/neglect since last visito No Secondary Verification Process Yes Hospitalized since last visit: No Completed: Has Dressing in Place as Prescribed: Yes Patient Requires Transmission-Based No Precautions: Has Compression in Place as Prescribed: Yes Patient Has Alerts: Yes Pain Present Now: Yes Electronic Signature(s) Signed: 11/13/2016 5:35:20 PM By: Alric Quan Entered By: Alric Quan on 11/13/2016 13:25:39 [name] ([id number]) -------------------------------------------------------------------------------- Clinic Level of Care Assessment Details Patient Name: [name] Date of Service: 11/13/2016 1:15 PM Medical Record Number: [id number] Patient Account Number: [id number] Date of Birth/Sex: 04-Feb-1945 (71 y.o. Female) Treating RN: Carolyne Fiscal, Debi Primary Care Jeiry Birnbaum: Lelon Huh Other Clinician: Referring Whittaker Lenis: Lelon Huh Treating Guadalupe Kerekes/Extender: Frann Rider in Treatment: 6 Clinic Level of Care Assessment Items TOOL 4 Quantity Score X - Use  when only an EandM is performed on FOLLOW-UP visit 1 0 ASSESSMENTS - Nursing Assessment / Reassessment X - Reassessment of Co-morbidities (includes updates in patient status) 1 10 X- 1 5 Reassessment of Adherence to Treatment Plan ASSESSMENTS - Wound and Skin Assessment / Reassessment []  - Simple Wound Assessment / Reassessment - one wound 0 X- 4 5 Complex Wound Assessment / Reassessment - multiple wounds []  - 0 Dermatologic / Skin Assessment (not related to wound area) ASSESSMENTS - Focused Assessment []  - Circumferential Edema Measurements - multi extremities 0 []  - 0 Nutritional Assessment / Counseling / Intervention []  - 0 Lower Extremity Assessment (monofilament, tuning fork, pulses) []  - 0 Peripheral Arterial Disease Assessment (using hand held doppler) ASSESSMENTS - Ostomy and/or Continence Assessment and Care []  - Incontinence Assessment and Management 0 []  - 0 Ostomy Care Assessment and Management (repouching, etc.) PROCESS - Coordination of Care []  - Simple Patient / Family Education for ongoing care 0 X- 1 20 Complex (extensive) Patient / Family Education for ongoing care X- 1 10 Staff obtains Programmer, systems, Records, Test Results / Process Orders X- 1 10 Staff telephones HHA, Nursing Homes / Clarify orders / etc []  - 0 Routine Transfer to another Facility (non-emergent condition) []  - 0 Routine Hospital Admission (non-emergent condition) []  - 0 New Admissions / Biomedical engineer / Ordering NPWT, Apligraf, etc. []  - 0 Emergency Hospital Admission (emergent condition) X- 1 10 Simple Discharge Coordination NEVE, BRANSCOMB ([id number]) []  - 0 Complex (extensive) Discharge Coordination PROCESS - Special Needs []  - Pediatric / Minor Patient Management 0 []  - 0 Isolation Patient Management []  - 0 Hearing / Language / Visual special needs []  - 0 Assessment of Community assistance (transportation, D/C planning, etc.) []  - 0 Additional assistance / Altered  mentation []  - 0 Support Surface(s) Assessment (bed, cushion, seat, etc.) INTERVENTIONS - Wound Cleansing / Measurement []  - Simple Wound Cleansing - one wound 0 X- 4 5 Complex Wound Cleansing - multiple wounds X-  1 5 Wound Imaging (photographs - any number of wounds) []  - 0 Wound Tracing (instead of photographs) []  - 0 Simple Wound Measurement - one wound X- 4 5 Complex Wound Measurement - multiple wounds INTERVENTIONS - Wound Dressings []  - Small Wound Dressing one or multiple wounds 0 []  - 0 Medium Wound Dressing one or multiple wounds X- 2 20 Large Wound Dressing one or multiple wounds X- 1 5 Application of Medications - topical []  - 0 Application of Medications - injection INTERVENTIONS - Miscellaneous []  - External ear exam 0 []  - 0 Specimen Collection (cultures, biopsies, blood, body fluids, etc.) []  - 0 Specimen(s) / Culture(s) sent or taken to Lab for analysis []  - 0 Patient Transfer (multiple staff / Civil Service fast streamer / Similar devices) []  - 0 Simple Staple / Suture removal (25 or less) []  - 0 Complex Staple / Suture removal (26 or more) []  - 0 Hypo / Hyperglycemic Management (close monitor of Blood Glucose) []  - 0 Ankle / Brachial Index (ABI) - do not check if billed separately X- 1 5 Vital Signs Kurka, Mariyam B. ([id number]) Has the patient been seen at the hospital within the last three years: Yes Total Score: 180 Level Of Care: New/Established - Level 5 Electronic Signature(s) Signed: 11/13/2016 5:35:20 PM By: Alric Quan Entered By: Alric Quan on 11/13/2016 17:10:12 [name] ([id number]) -------------------------------------------------------------------------------- Encounter Discharge Information Details Patient Name: Jillian Albe B. Date of Service: 11/13/2016 1:15 PM Medical Record Number: [id number] Patient Account Number: [id number] Date of Birth/Sex: 26-Dec-1945 (71 y.o. Female) Treating RN: Ahmed Prima Primary Care Lain Tetterton:  Lelon Huh Other Clinician: Referring Maeli Spacek: Lelon Huh Treating Shakura Cowing/Extender: Frann Rider in Treatment: 6 Encounter Discharge Information Items Discharge Pain Level: 8 Discharge Condition: Stable Ambulatory Status: Wheelchair Discharge Destination: Home Transportation: Private Auto Accompanied By: husband Schedule Follow-up Appointment: Yes Medication Reconciliation completed and No provided to Patient/Care Tenzin Pavon: Provided on Clinical Summary of Care: 11/13/2016 Form Type Recipient Paper Patient CS Electronic Signature(s) Signed: 11/15/2016 11:35:39 AM By: Ruthine Dose Entered By: Ruthine Dose on 11/13/2016 14:24:31 [name] ([id number]) -------------------------------------------------------------------------------- Lower Extremity Assessment Details Patient Name: Jillian Albe B. Date of Service: 11/13/2016 1:15 PM Medical Record Number: [id number] Patient Account Number: [id number] Date of Birth/Sex: 1945/06/11 (71 y.o. Female) Treating RN: Carolyne Fiscal, Debi Primary Care Tavarus Poteete: Lelon Huh Other Clinician: Referring Kiaraliz Rafuse: Lelon Huh Treating Sangita Zani/Extender: Frann Rider in Treatment: 6 Edema Assessment Assessed: [Left: No] [Right: No] [Left: Edema] [Right: :] Calf Left: Right: Point of Measurement: 28 cm From Medial Instep 46.8 cm 45.8 cm Ankle Left: Right: Point of Measurement: 9 cm From Medial Instep 26 cm 25.2 cm Vascular Assessment Pulses: Dorsalis Pedis Palpable: [Left:No] [Right:No] Doppler Audible: [Left:Yes] [Right:Yes] Posterior Tibial Extremity colors, hair growth, and conditions: Extremity Color: [Left:Red] [Right:Red] Temperature of Extremity: [Left:Warm] [Right:Warm] Capillary Refill: [Left:< 3 seconds] [Right:< 3 seconds] Toe Nail Assessment Left: Right: Thick: Yes Yes Discolored: Yes Yes Deformed: No No Improper Length and Hygiene: Yes Yes Electronic Signature(s) Signed: 11/13/2016  5:35:20 PM By: Alric Quan Entered By: Alric Quan on 11/13/2016 13:45:16 [name] ([id number]) -------------------------------------------------------------------------------- Multi Wound Chart Details Patient Name: Jillian Albe B. Date of Service: 11/13/2016 1:15 PM Medical Record Number: [id number] Patient Account Number: [id number] Date of Birth/Sex: [birth year] (71 y.o. Female) Treating RN: Ahmed Prima Primary Care Ramiel Forti: Lelon Huh Other Clinician: Referring Annalysa Mohammad: Lelon Huh Treating Manjot Beumer/Extender: Frann Rider in Treatment: 6 Vital Signs Height(in): 64 Pulse(bpm): 60 Weight(lbs): 206 Blood Pressure(mmHg): 141/66 Body Mass Index(BMI): 35  Temperature(F): Respiratory Rate 16 (breaths/min): Photos: [1:No Photos] [2:No Photos] [3:No Photos] Wound Location: [1:Left Lower Leg - Lateral] [2:Left Lower Leg - Anterior] [3:Left Lower Leg - Medial] Wounding Event: [1:Gradually Appeared] [2:Gradually Appeared] [3:Gradually Appeared] Primary Etiology: [1: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] [2:Lower Extremity] [3:Lower Extremity] Comorbid History: [1:Asthma, Congestive Heart Failure, Coronary Artery Disease, Hypertension, Type II Disease, Hypertension, Type II Disease, Hypertension, Type II Diabetes, Osteoarthritis] [2:Asthma, Congestive Heart Failure, Coronary Artery Diabetes,  Osteoarthritis] [3:Asthma, Congestive Heart Failure, Coronary Artery Diabetes, Osteoarthritis] Date Acquired: [1:08/02/2016] [2:08/02/2016] [3:08/02/2016] Weeks of Treatment: [1:6] [2:6] [3:6] Wound Status: [1:Open] [2:Open] [3:Open] Measurements L x W x D [1:8x6x0.3] [2:5x2.3x0.1] [3:2.5x2x0.1] (cm) Area (cm) : [1:37.699] [2:9.032] [3:3.927] Volume (cm) : [1:11.31] [2:0.903] [3:0.393] % Reduction in Area: [1:-118.20%] [2:-15.00%] [3:28.60%] % Reduction in Volume: [1:-554.50%] [2:-15.00%]  [3:28.50%] Classification: [1:Grade 1] [2:Grade 1] [3:Grade 1] Exudate Amount: [1:Large] [2:Large] [3:Large] Exudate Type: [1:Serosanguineous] [2:Serous] [3:Serous] Exudate Color: [1:red, brown] [2:amber] [3:amber] Wound Margin: [1:Distinct, outline attached] [2:Distinct, outline attached] [3:Distinct, outline attached] Granulation Amount: [1:Small (1-33%)] [2:Small (1-33%)] [3:None Present (0%)] Granulation Quality: [1:Red] [2:Pink] [3:N/A] Necrotic Amount: [1:Large (67-100%)] [2:Large (67-100%)] [3:Large (67-100%)] Necrotic Tissue: [1:Adherent Slough] [2:Eschar, Adherent Slough] [3:Eschar, Adherent Slough] Epithelialization: [1:None] [2:None] [3:None] Periwound Skin Texture: [1:No Abnormalities Noted] [2:No Abnormalities Noted] [3:No Abnormalities Noted] Periwound Skin Moisture: [1:Maceration: Yes] [2:Maceration: Yes] [3:Maceration: Yes] Periwound Skin Color: [1:Erythema: Yes] [2:Erythema: Yes] [3:Erythema: Yes] Erythema Location: [1:Circumferential] [2:Circumferential] [3:Circumferential] Temperature: [1:No Abnormality] [2:No Abnormality] [3:No Abnormality] Tenderness on Palpation: [1:No] [2:Yes] [3:Yes] Wound Preparation: [1:Ulcer Cleansing: Rinsed/Irrigated with Saline, Other: soap and water] [2:Ulcer Cleansing: Rinsed/Irrigated with Saline, Other: soap and water] [3:Ulcer Cleansing: Rinsed/Irrigated with Saline, Other: soap and water] Topical Anesthetic Applied: Topical Anesthetic Applied: Topical Anesthetic Applied: Other: lidocaine 4% Other: lidocaine 4% Other: lidocaine 4% Wound Number: 6 N/A N/A Photos: No Photos N/A N/A Wound Location: Right Lower Leg - Lateral N/A N/A Wounding Event: Gradually Appeared N/A N/A Primary Etiology: Diabetic Wound/Ulcer of the N/A N/A Lower Extremity Comorbid History: Asthma, Congestive Heart N/A N/A Failure, Coronary Artery Disease, Hypertension, Type II Diabetes, Osteoarthritis Date Acquired: 08/02/2016 N/A N/A Weeks of Treatment: 6  N/A N/A Wound Status: Open N/A N/A Measurements L x W x D 0.2x0.2x0.1 N/A N/A (cm) Area (cm) : 0.031 N/A N/A Volume (cm) : 0.003 N/A N/A % Reduction in Area: 99.50% N/A N/A % Reduction in Volume: 99.50% N/A N/A Classification: Grade 1 N/A N/A Exudate Amount: Large N/A N/A Exudate Type: Serous N/A N/A Exudate Color: amber N/A N/A Wound Margin: Distinct, outline attached N/A N/A Granulation Amount: None Present (0%) N/A N/A Granulation Quality: N/A N/A N/A Necrotic Amount: Large (67-100%) N/A N/A Necrotic Tissue: Adherent Slough N/A N/A Epithelialization: Medium (34-66%) N/A N/A Periwound Skin Texture: No Abnormalities Noted N/A N/A Periwound Skin Moisture: Maceration: Yes N/A N/A Periwound Skin Color: Erythema: Yes N/A N/A Erythema Location: Circumferential N/A N/A Temperature: No Abnormality N/A N/A Tenderness on Palpation: Yes N/A N/A Wound Preparation: Ulcer Cleansing: N/A N/A Rinsed/Irrigated with Saline, Other: soap and water Topical Anesthetic Applied: Other: lidocaine 4% Treatment Notes Electronic Signature(s) Signed: 11/13/2016 2:10:03 PM By: Christin Fudge MD, FACS Entered By: Christin Fudge on 11/13/2016 14:10:02 [name] ([id number]) -------------------------------------------------------------------------------- [place] Details Patient Name: SHAWNA, WEARING. Date of Service: 11/13/2016 1:15 PM Medical Record Number: [id number] Patient Account Number: [id number] Date of Birth/Sex: July 02, 1945 (71 y.o. Female) Treating RN: Carolyne Fiscal, Debi Primary Care Clyde Upshaw: Lelon Huh Other Clinician: Referring Kailana Benninger: Caryn Section  DONALD Treating Samer Dutton/Extender: Frann Rider in Treatment: 6 Active Inactive ` Abuse / Safety / Falls / Self Care Management Nursing Diagnoses: History of Falls Potential for falls Goals: Patient will not experience any injury related to falls Date Initiated: 10/02/2016 Target Resolution Date:  01/06/2017 Goal Status: Active Interventions: Assess: immobility, friction, shearing, incontinence upon admission and as needed Assess impairment of mobility on admission and as needed per policy Assess personal safety and home safety (as indicated) on admission and as needed Notes: ` Nutrition Nursing Diagnoses: Imbalanced nutrition Impaired glucose control: actual or potential Potential for alteratiion in Nutrition/Potential for imbalanced nutrition Goals: Patient/caregiver agrees to and verbalizes understanding of need to use nutritional supplements and/or vitamins as prescribed Date Initiated: 10/02/2016 Target Resolution Date: 02/10/2017 Goal Status: Active Patient/caregiver will maintain therapeutic glucose control Date Initiated: 10/02/2016 Target Resolution Date: 01/06/2017 Goal Status: Active Interventions: Assess patient nutrition upon admission and as needed per policy Provide education on elevated blood sugars and impact on wound healing Provide education on nutrition Treatment Activities: Education provided on Nutrition : 10/02/2016 KREE, ARMATO ([id number]) Notes: ` Orientation to the Wound Care Program Nursing Diagnoses: Knowledge deficit related to the wound healing center program Goals: Patient/caregiver will verbalize understanding of the [facility] Date Initiated: 10/02/2016 Target Resolution Date: 11/11/2016 Goal Status: Active Interventions: Provide education on orientation to the wound center Notes: ` Pain, Acute or Chronic Nursing Diagnoses: Pain, acute or chronic: actual or potential Potential alteration in comfort, pain Goals: Patient/caregiver will verbalize adequate pain control between visits Date Initiated: 10/02/2016 Target Resolution Date: 02/10/2017 Goal Status: Active Interventions: Complete pain assessment as per visit requirements Notes: ` Wound/Skin Impairment Nursing Diagnoses: Impaired tissue integrity Knowledge  deficit related to ulceration/compromised skin integrity Goals: Ulcer/skin breakdown will have a volume reduction of 80% by week 12 Date Initiated: 10/02/2016 Target Resolution Date: 02/03/2017 Goal Status: Active Interventions: Assess patient/caregiver ability to perform ulcer/skin care regimen upon admission and as needed Assess ulceration(s) every visit Notes: SUMAYYAH, CUSTODIO ([id number]) Electronic Signature(s) Signed: 11/13/2016 5:35:20 PM By: Alric Quan Entered By: Alric Quan on 11/13/2016 13:46:09 [name] ([id number]) -------------------------------------------------------------------------------- Pain Assessment Details Patient Name: Jillian Albe B. Date of Service: 11/13/2016 1:15 PM Medical Record Number: [id number] Patient Account Number: [id number] Date of Birth/Sex: April 17, 1945 (71 y.o. Female) Treating RN: Ahmed Prima Primary Care Zayvon Alicea: Lelon Huh Other Clinician: Referring Ellysia Char: Lelon Huh Treating Alioune Hodgkin/Extender: Frann Rider in Treatment: 6 Active Problems Location of Pain Severity and Description of Pain Patient Has Paino Yes Site Locations Rate the pain. Current Pain Level: 10 Character of Pain Describe the Pain: Aching, Burning, Tender, Throbbing Pain Management and Medication Current Pain Management: Electronic Signature(s) Signed: 11/13/2016 5:35:20 PM By: Alric Quan Entered By: Alric Quan on 11/13/2016 13:26:00 [name] ([id number]) -------------------------------------------------------------------------------- Patient/Caregiver Education Details Patient Name: [name]. Date of Service: 11/13/2016 1:15 PM Medical Record Number: [id number] Patient Account Number: [id number] Date of Birth/Gender: [birth year] (71 y.o. Female) Treating RN: Ahmed Prima Primary Care Physician: Lelon Huh Other Clinician: Referring Physician: Lelon Huh Treating Physician/Extender: Frann Rider in Treatment: 6 Education Assessment Education Provided To: Patient Education Topics Provided Wound/Skin Impairment: Handouts: Other: do not get wraps wet Methods: Demonstration, Explain/Verbal Responses: State content correctly Electronic Signature(s) Signed: 11/13/2016 5:35:20 PM By: Alric Quan Entered By: Alric Quan on 11/13/2016 13:56:29 Schow, Jeanice Lim ([id number]) -------------------------------------------------------------------------------- Wound Assessment Details Patient Name: Jillian Albe B. Date of Service: 11/13/2016 1:15 PM Medical Record Number: [id number] Patient Account Number:  [id number] Date of Birth/Sex: 10/22/1945 (71 y.o. Female) Treating RN: Carolyne Fiscal, Debi Primary Care Alantis Bethune: Lelon Huh Other Clinician: Referring Lisett Dirusso: Lelon Huh Treating Sherly Brodbeck/Extender: Frann Rider in Treatment: 6 Wound Status Wound Number: 1 Primary Diabetic Wound/Ulcer of the Lower Extremity Etiology: Wound Location: Left Lower Leg - Lateral Wound Open Wounding Event: Gradually Appeared Status: Date Acquired: 08/02/2016 Comorbid Asthma, Congestive Heart Failure, Coronary Weeks Of Treatment: 6 History: Artery Disease, Hypertension, Type II Diabetes, Clustered Wound: No Osteoarthritis Photos Photo Uploaded By: Alric Quan on 11/14/2016 08:14:46 Wound Measurements Length: (cm) 8 Width: (cm) 6 Depth: (cm) 0.3 Area: (cm) 37.699 Volume: (cm) 11.31 % Reduction in Area: -118.2% % Reduction in Volume: -554.5% Epithelialization: None Tunneling: No Undermining: No Wound Description Classification: Grade 1 Wound Margin: Distinct, outline attached Exudate Amount: Large Exudate Type: Serosanguineous Exudate Color: red, brown Foul Odor After Cleansing: No Slough/Fibrino Yes Wound Bed Granulation Amount: Small (1-33%) Granulation Quality: Red Necrotic Amount: Large (67-100%) Necrotic Quality: Adherent Slough Periwound  Skin Texture Texture Color No Abnormalities Noted: No No Abnormalities Noted: No Erythema: Yes Moisture GEONNA, LOCKYER B. ([id number]) No Abnormalities Noted: No Erythema Location: Circumferential Maceration: Yes Temperature / Pain Temperature: No Abnormality Wound Preparation Ulcer Cleansing: Rinsed/Irrigated with Saline, Other: soap and water, Topical Anesthetic Applied: Other: lidocaine 4%, Treatment Notes Wound #1 (Left, Lateral Lower Leg) 1. Cleansed with: Clean wound with Normal Saline Cleanse wound with antibacterial soap and water 2. Anesthetic Topical Lidocaine 4% cream to wound bed prior to debridement 3. Peri-wound Care: Barrier cream 4. Dressing Applied: Santyl Ointment 5. Secondary Dressing Applied ABD Pad Dry Gauze Notes xtrasorb, kerlix, coban, unna to Engineer, production) Signed: 11/13/2016 5:35:20 PM By: Alric Quan Entered By: Alric Quan on 11/13/2016 13:41:00 [name] ([id number]) -------------------------------------------------------------------------------- Wound Assessment Details Patient Name: LIVANA, YERIAN B. Date of Service: 11/13/2016 1:15 PM Medical Record Number: [id number] Patient Account Number: [id number] Date of Birth/Sex: 1945/11/19 (71 y.o. Female) Treating RN: Carolyne Fiscal, Debi Primary Care Adonai Helzer: Lelon Huh Other Clinician: Referring Prudence Heiny: Lelon Huh Treating Redford Behrle/Extender: Frann Rider in Treatment: 6 Wound Status Wound Number: 2 Primary Diabetic Wound/Ulcer of the Lower Extremity Etiology: Wound Location: Left Lower Leg - Anterior Wound Open Wounding Event: Gradually Appeared Status: Date Acquired: 08/02/2016 Comorbid Asthma, Congestive Heart Failure, Coronary Weeks Of Treatment: 6 History: Artery Disease, Hypertension, Type II Diabetes, Clustered Wound: No Osteoarthritis Photos Photo Uploaded By: Alric Quan on 11/14/2016 08:14:47 Wound Measurements Length: (cm)  5 Width: (cm) 2.3 Depth: (cm) 0.1 Area: (cm) 9.032 Volume: (cm) 0.903 % Reduction in Area: -15% % Reduction in Volume: -15% Epithelialization: None Tunneling: No Undermining: No Wound Description Classification: Grade 1 Wound Margin: Distinct, outline attached Exudate Amount: Large Exudate Type: Serous Exudate Color: amber Foul Odor After Cleansing: No Slough/Fibrino Yes Wound Bed Granulation Amount: Small (1-33%) Granulation Quality: Pink Necrotic Amount: Large (67-100%) Necrotic Quality: Eschar, Adherent Slough Periwound Skin Texture Texture Color No Abnormalities Noted: No No Abnormalities Noted: No Erythema: Yes Moisture MINNAH, LLAMAS B. ([id number]) No Abnormalities Noted: No Erythema Location: Circumferential Maceration: Yes Temperature / Pain Temperature: No Abnormality Tenderness on Palpation: Yes Wound Preparation Ulcer Cleansing: Rinsed/Irrigated with Saline, Other: soap and water, Topical Anesthetic Applied: Other: lidocaine 4%, Treatment Notes Wound #2 (Left, Anterior Lower Leg) 1. Cleansed with: Clean wound with Normal Saline Cleanse wound with antibacterial soap and water 2. Anesthetic Topical Lidocaine 4% cream to wound bed prior to debridement 3. Peri-wound Care: Barrier cream 4. Dressing Applied: Other dressing (specify in notes) 5. Secondary Dressing Applied  ABD Pad Notes xtrasorb, kerlix, coban, unna to anchor, silvercel Electronic Signature(s) Signed: 11/13/2016 5:35:20 PM By: Alric Quan Entered By: Alric Quan on 11/13/2016 13:41:38 [name] ([id number]) -------------------------------------------------------------------------------- Wound Assessment Details Patient Name: ARGIE, LOBER B. Date of Service: 11/13/2016 1:15 PM Medical Record Number: [id number] Patient Account Number: [id number] Date of Birth/Sex: 11/22/1945 (71 y.o. Female) Treating RN: Carolyne Fiscal, Debi Primary Care Sofia Jaquith: Lelon Huh Other  Clinician: Referring Avril Busser: Lelon Huh Treating Efrem Pitstick/Extender: Frann Rider in Treatment: 6 Wound Status Wound Number: 3 Primary Diabetic Wound/Ulcer of the Lower Extremity Etiology: Wound Location: Left Lower Leg - Medial Wound Open Wounding Event: Gradually Appeared Status: Date Acquired: 08/02/2016 Comorbid Asthma, Congestive Heart Failure, Coronary Weeks Of Treatment: 6 History: Artery Disease, Hypertension, Type II Diabetes, Clustered Wound: No Osteoarthritis Photos Photo Uploaded By: Alric Quan on 11/14/2016 08:14:04 Wound Measurements Length: (cm) 2.5 Width: (cm) 2 Depth: (cm) 0.1 Area: (cm) 3.927 Volume: (cm) 0.393 % Reduction in Area: 28.6% % Reduction in Volume: 28.5% Epithelialization: None Tunneling: No Undermining: No Wound Description Classification: Grade 1 Wound Margin: Distinct, outline attached Exudate Amount: Large Exudate Type: Serous Exudate Color: amber Foul Odor After Cleansing: No Slough/Fibrino Yes Wound Bed Granulation Amount: None Present (0%) Necrotic Amount: Large (67-100%) Necrotic Quality: Eschar, Adherent Slough Periwound Skin Texture Texture Color No Abnormalities Noted: No No Abnormalities Noted: No Erythema: Yes Moisture Erythema Location: Circumferential No Abnormalities Noted: No GERMAINE, RIPP B. ([id number]) Maceration: Yes Temperature / Pain Temperature: No Abnormality Tenderness on Palpation: Yes Wound Preparation Ulcer Cleansing: Rinsed/Irrigated with Saline, Other: soap and water, Topical Anesthetic Applied: Other: lidocaine 4%, Treatment Notes Wound #3 (Left, Medial Lower Leg) 1. Cleansed with: Clean wound with Normal Saline Cleanse wound with antibacterial soap and water 2. Anesthetic Topical Lidocaine 4% cream to wound bed prior to debridement 3. Peri-wound Care: Barrier cream 4. Dressing Applied: Santyl Ointment 5. Secondary Dressing Applied ABD Pad Dry Gauze Notes xtrasorb,  kerlix, coban, unna to Engineer, production) Signed: 11/13/2016 5:35:20 PM By: Alric Quan Entered By: Alric Quan on 11/13/2016 13:42:22 [name] ([id number]) -------------------------------------------------------------------------------- Wound Assessment Details Patient Name: ROSARIO, DUEY B. Date of Service: 11/13/2016 1:15 PM Medical Record Number: [id number] Patient Account Number: [id number] Date of Birth/Sex: December 15, 1945 (71 y.o. Female) Treating RN: Carolyne Fiscal, Debi Primary Care Armstead Heiland: Lelon Huh Other Clinician: Referring Caden Fukushima: Lelon Huh Treating Cinsere Mizrahi/Extender: Frann Rider in Treatment: 6 Wound Status Wound Number: 6 Primary Diabetic Wound/Ulcer of the Lower Extremity Etiology: Wound Location: Right Lower Leg - Lateral Wound Open Wounding Event: Gradually Appeared Status: Date Acquired: 08/02/2016 Comorbid Asthma, Congestive Heart Failure, Coronary Weeks Of Treatment: 6 History: Artery Disease, Hypertension, Type II Diabetes, Clustered Wound: No Osteoarthritis Photos Photo Uploaded By: Alric Quan on 11/14/2016 08:16:07 Wound Measurements Length: (cm) 0.2 Width: (cm) 0.2 Depth: (cm) 0.1 Area: (cm) 0.031 Volume: (cm) 0.003 % Reduction in Area: 99.5% % Reduction in Volume: 99.5% Epithelialization: Medium (34-66%) Tunneling: No Undermining: No Wound Description Classification: Grade 1 Fou Wound Margin: Distinct, outline attached Slo Exudate Amount: Large Exudate Type: Serous Exudate Color: amber l Odor After Cleansing: No ugh/Fibrino Yes Wound Bed Granulation Amount: None Present (0%) Necrotic Amount: Large (67-100%) Necrotic Quality: Adherent Slough Periwound Skin Texture Texture Color No Abnormalities Noted: No No Abnormalities Noted: No Erythema: Yes Moisture Erythema Location: Circumferential No Abnormalities Noted: No ANNASOFIA, POHL B. ([id number]) Maceration: Yes Temperature /  Pain Temperature: No Abnormality Tenderness on Palpation: Yes Wound Preparation Ulcer Cleansing: Rinsed/Irrigated with Saline, Other: soap and water, Topical Anesthetic Applied:  Other: lidocaine 4%, Treatment Notes Wound #6 (Right, Lateral Lower Leg) 1. Cleansed with: Clean wound with Normal Saline Cleanse wound with antibacterial soap and water 2. Anesthetic Topical Lidocaine 4% cream to wound bed prior to debridement 5. Secondary Dressing Applied Dry Gauze Foam Notes xtrasorb, kerlix, coban, unna to Engineer, production) Signed: 11/13/2016 5:35:20 PM By: Alric Quan Entered By: Alric Quan on 11/13/2016 13:40:00 [name] ([id number]) -------------------------------------------------------------------------------- [place] Details Patient Name: Jillian Albe B. Date of Service: 11/13/2016 1:15 PM Medical Record Number: [id number] Patient Account Number: [id number] Date of Birth/Sex: July 12, 1945 (71 y.o. Female) Treating RN: Carolyne Fiscal, Debi Primary Care Rosio Weiss: Lelon Huh Other Clinician: Referring Alexica Schlossberg: Lelon Huh Treating Meilech Virts/Extender: Frann Rider in Treatment: 6 Vital Signs Time Taken: 13:26 Pulse (bpm): 60 Height (in): 64 Respiratory Rate (breaths/min): 16 Weight (lbs): 206 Blood Pressure (mmHg): 141/66 Body Mass Index (BMI): 35.4 Reference Range: 80 - 120 mg / dl Electronic Signature(s) Signed: 11/13/2016 5:35:20 PM By: Alric Quan Entered By: Alric Quan on 11/13/2016 13:28:49

## 2016-11-17 DIAGNOSIS — Z4800 Encounter for change or removal of nonsurgical wound dressing: Secondary | ICD-10-CM | POA: Diagnosis not present

## 2016-11-17 DIAGNOSIS — Z79891 Long term (current) use of opiate analgesic: Secondary | ICD-10-CM | POA: Diagnosis not present

## 2016-11-17 DIAGNOSIS — L97212 Non-pressure chronic ulcer of right calf with fat layer exposed: Secondary | ICD-10-CM | POA: Diagnosis not present

## 2016-11-17 DIAGNOSIS — L97211 Non-pressure chronic ulcer of right calf limited to breakdown of skin: Secondary | ICD-10-CM | POA: Diagnosis not present

## 2016-11-17 DIAGNOSIS — L97222 Non-pressure chronic ulcer of left calf with fat layer exposed: Secondary | ICD-10-CM | POA: Diagnosis not present

## 2016-11-17 DIAGNOSIS — I2510 Atherosclerotic heart disease of native coronary artery without angina pectoris: Secondary | ICD-10-CM | POA: Diagnosis not present

## 2016-11-17 DIAGNOSIS — I429 Cardiomyopathy, unspecified: Secondary | ICD-10-CM | POA: Diagnosis not present

## 2016-11-17 DIAGNOSIS — E11622 Type 2 diabetes mellitus with other skin ulcer: Secondary | ICD-10-CM | POA: Diagnosis not present

## 2016-11-17 DIAGNOSIS — I272 Pulmonary hypertension, unspecified: Secondary | ICD-10-CM | POA: Diagnosis not present

## 2016-11-17 DIAGNOSIS — I110 Hypertensive heart disease with heart failure: Secondary | ICD-10-CM | POA: Diagnosis not present

## 2016-11-17 DIAGNOSIS — Z7984 Long term (current) use of oral hypoglycemic drugs: Secondary | ICD-10-CM | POA: Diagnosis not present

## 2016-11-17 DIAGNOSIS — J45909 Unspecified asthma, uncomplicated: Secondary | ICD-10-CM | POA: Diagnosis not present

## 2016-11-17 DIAGNOSIS — I509 Heart failure, unspecified: Secondary | ICD-10-CM | POA: Diagnosis not present

## 2016-11-17 DIAGNOSIS — M1990 Unspecified osteoarthritis, unspecified site: Secondary | ICD-10-CM | POA: Diagnosis not present

## 2016-11-19 ENCOUNTER — Encounter: Payer: Self-pay | Admitting: Hematology and Oncology

## 2016-11-20 ENCOUNTER — Encounter: Payer: Medicare Other | Admitting: Surgery

## 2016-11-20 DIAGNOSIS — I429 Cardiomyopathy, unspecified: Secondary | ICD-10-CM | POA: Diagnosis not present

## 2016-11-20 DIAGNOSIS — L97219 Non-pressure chronic ulcer of right calf with unspecified severity: Secondary | ICD-10-CM | POA: Diagnosis not present

## 2016-11-20 DIAGNOSIS — I2510 Atherosclerotic heart disease of native coronary artery without angina pectoris: Secondary | ICD-10-CM | POA: Diagnosis not present

## 2016-11-20 DIAGNOSIS — J45909 Unspecified asthma, uncomplicated: Secondary | ICD-10-CM | POA: Diagnosis not present

## 2016-11-20 DIAGNOSIS — Z79899 Other long term (current) drug therapy: Secondary | ICD-10-CM | POA: Diagnosis not present

## 2016-11-20 DIAGNOSIS — L97822 Non-pressure chronic ulcer of other part of left lower leg with fat layer exposed: Secondary | ICD-10-CM | POA: Diagnosis not present

## 2016-11-20 DIAGNOSIS — M1990 Unspecified osteoarthritis, unspecified site: Secondary | ICD-10-CM | POA: Diagnosis not present

## 2016-11-20 DIAGNOSIS — I509 Heart failure, unspecified: Secondary | ICD-10-CM | POA: Diagnosis not present

## 2016-11-20 DIAGNOSIS — Z880 Allergy status to penicillin: Secondary | ICD-10-CM | POA: Diagnosis not present

## 2016-11-20 DIAGNOSIS — L97222 Non-pressure chronic ulcer of left calf with fat layer exposed: Secondary | ICD-10-CM | POA: Diagnosis not present

## 2016-11-20 DIAGNOSIS — E11622 Type 2 diabetes mellitus with other skin ulcer: Secondary | ICD-10-CM | POA: Diagnosis not present

## 2016-11-20 DIAGNOSIS — I890 Lymphedema, not elsewhere classified: Secondary | ICD-10-CM | POA: Diagnosis not present

## 2016-11-20 DIAGNOSIS — I272 Pulmonary hypertension, unspecified: Secondary | ICD-10-CM | POA: Diagnosis not present

## 2016-11-20 DIAGNOSIS — I110 Hypertensive heart disease with heart failure: Secondary | ICD-10-CM | POA: Diagnosis not present

## 2016-11-20 DIAGNOSIS — L97212 Non-pressure chronic ulcer of right calf with fat layer exposed: Secondary | ICD-10-CM | POA: Diagnosis not present

## 2016-11-20 DIAGNOSIS — Z7984 Long term (current) use of oral hypoglycemic drugs: Secondary | ICD-10-CM | POA: Diagnosis not present

## 2016-11-21 NOTE — Progress Notes (Signed)
FERGIE, SHERBERT ([id number]) Visit Report for 11/20/2016 Chief Complaint Document Details Patient Name: [name], [name]. Date of Service: 11/20/2016 9:15 AM Medical Record Number: [id number] Patient Account Number: [id number] Date of Birth/Sex: 02-Sep-1945 (71 y.o. Female) Treating RN: Montey Hora Primary Care Provider: Lelon Huh Other Clinician: Referring Provider: Lelon Huh Treating Provider/Extender: Frann Rider in Treatment: 7 Information Obtained from: Patient Chief Complaint Patient seen for complaints of Non-Healing Wounds to both lower extremities which she's had for about 2 months with swelling. Electronic Signature(s) Signed: 11/20/2016 10:18:39 AM By: Christin Fudge MD, FACS Entered By: Christin Fudge on 11/20/2016 10:18:39 [name] ([id number]) -------------------------------------------------------------------------------- HPI Details Patient Name: UGOCHI, HENZLER B. Date of Service: 11/20/2016 9:15 AM Medical Record Number: [id number] Patient Account Number: [id number] Date of Birth/Sex: [birth year] (71 y.o. Female) Treating RN: Montey Hora Primary Care Provider: Lelon Huh Other Clinician: Referring Provider: Lelon Huh Treating Provider/Extender: Frann Rider in Treatment: 7 History of Present Illness Location: both lower extremity swelling with ulceration Quality: Patient reports experiencing a sharp pain to affected area(s). Severity: Patient states wound are getting worse. Duration: Patient has had the wound for > 2 months prior to seeking treatment at the wound center Timing: Pain in wound is constant (hurts all the time) and is worse when she elevates her legs Context: The wound would happen gradually Modifying Factors: Other treatment(s) tried include:she has been on ciprofloxacin and has been applying Silvadene ointment Associated Signs and Symptoms: Patient reports having increase swelling. HPI Description: 71 year old  patient who sees her PCP Dr. Lelon Huh was recently evaluated 10 days ago for diabetes mellitus, hypertension, CHF and hyperlipidemia. she also was noted to have ulcerations develop in her legs and she has been applying Silvadene dressings locally. In the past she has refused wound care referrals.her cardiologist Dr. Saunders Revel saw her and put her on 40 mg of furosemide daily. last hemoglobin A1c was 7.7%. she was also placed on ciprofloxacin twice daily for 7 days and a urine culture was recommended. past medical history significant for coronary artery disease, diabetes mellitus, nonischemic cardiomyopathy and pulmonary hypertension. She is also status post heart catheterization and coronary angiography, tubal ligation and breast cyst removal in the past. She has never been a smoker. Patient had arterial studies done which showed bilateral ABIs are artificially elevated due to noncompressible and calcified vessels. Triphasic waveform throughout. Right great toe TBI is elevated while the left is normal. 10/23/2016 -- the patient is rather moribund from several issues including chronic back pain and knee pain and swelling of her legs. The large necrotic area on her left lateral anterior calf was bleeding on touch after washing her leg. There was a spot which needed silver nitrate cauterization and this was done appropriately. 11/06/2016 -- she is again noted to have friable bleeding from the left lower extremity wounds and this again had to be cauterized with silver nitrate to control the bleeding as pressure itself would not do it. 11/20/2016 -- the right lower extremity is completely healed and we have ordered 30-40 mm compression stocking's in both the dural layer and also a pair of juxta lites. Electronic Signature(s) Signed: 11/20/2016 10:19:32 AM By: Christin Fudge MD, FACS Entered By: Christin Fudge on 11/20/2016 10:19:32 [name]  ([id number]) -------------------------------------------------------------------------------- Physical Exam Details Patient Name: SYNETHIA, ENDICOTT B. Date of Service: 11/20/2016 9:15 AM Medical Record Number: [id number] Patient Account Number: [id number] Date of Birth/Sex: January 17, 1945 (71 y.o. Female) Treating RN: Montey Hora Primary Care Provider: Lelon Huh Other  Clinician: Referring Provider: Lelon Huh Treating Provider/Extender: Frann Rider in Treatment: 7 Constitutional . Pulse regular. Respirations normal and unlabored. Afebrile. . Eyes Nonicteric. Reactive to light. Ears, Nose, Mouth, and Throat Lips, teeth, and gums WNL.Marland Kitchen Moist mucosa without lesions. Neck supple and nontender. No palpable supraclavicular or cervical adenopathy. Normal sized without goiter. Respiratory WNL. No retractions.. Cardiovascular Pedal Pulses WNL. No clubbing, cyanosis or edema. Lymphatic No adneopathy. No adenopathy. No adenopathy. Musculoskeletal Adexa without tenderness or enlargement.. Digits and nails w/o clubbing, cyanosis, infection, petechiae, ischemia, or inflammatory conditions.. Integumentary (Hair, Skin) No suspicious lesions. No crepitus or fluctuance. No peri-wound warmth or erythema. No masses.Marland Kitchen Psychiatric Judgement and insight Intact.. No evidence of depression, anxiety, or agitation.. Notes the lymphoid enema both lower extremities is still significant and on the right she will wear her own compression stockings. On the left lower extremity debridement was not done and the tissue continues to have a lot of subcutaneous debris for which we will use Santyl ointment locally and a light compression to be changed 3 times a week Electronic Signature(s) Signed: 11/20/2016 10:20:34 AM By: Christin Fudge MD, FACS Entered By: Christin Fudge on 11/20/2016 10:20:33 [name]  ([id number]) -------------------------------------------------------------------------------- Physician Orders Details Patient Name: [name] Date of Service: 11/20/2016 9:15 AM Medical Record Number: [id number] Patient Account Number: [id number] Date of Birth/Sex: 21-Oct-1945 (71 y.o. Female) Treating RN: Montey Hora Primary Care Provider: Lelon Huh Other Clinician: Referring Provider: Lelon Huh Treating Provider/Extender: Frann Rider in Treatment: 7 Verbal / Phone Orders: No Diagnosis Coding Wound Cleansing Wound #1 Left,Lateral Lower Leg o Clean wound with Normal Saline. o Cleanse wound with mild soap and water o May Shower, gently pat wound dry prior to applying new dressing. Wound #2 Left,Anterior Lower Leg o Clean wound with Normal Saline. o Cleanse wound with mild soap and water o May Shower, gently pat wound dry prior to applying new dressing. Wound #3 Left,Medial Lower Leg o Clean wound with Normal Saline. o Cleanse wound with mild soap and water o May Shower, gently pat wound dry prior to applying new dressing. Anesthetic Wound #1 Left,Lateral Lower Leg o Topical Lidocaine 4% cream applied to wound bed prior to debridement - for clinic use Wound #2 Left,Anterior Lower Leg o Topical Lidocaine 4% cream applied to wound bed prior to debridement - for clinic use Wound #3 Left,Medial Lower Leg o Topical Lidocaine 4% cream applied to wound bed prior to debridement - for clinic use Skin Barriers/Peri-Wound Care Wound #1 Left,Lateral Lower Leg o Barrier cream Wound #2 Left,Anterior Lower Leg o Barrier cream Wound #3 Left,Medial Lower Leg o Barrier cream Primary Wound Dressing Wound #1 Left,Lateral Lower Leg o Santyl Ointment Wound #2 Left,Anterior Lower Leg o Santyl Ointment Wound #3 Left,Medial Lower Leg FRANCELLA, BARNETT ([id number]) o Santyl Ointment Secondary Dressing Wound #1 Left,Lateral Lower Leg o  ABD pad o Dry Gauze o XtraSorb o Other - carboflex Wound #2 Left,Anterior Lower Leg o ABD pad o Dry Gauze o XtraSorb o Other - carboflex Wound #3 Left,Medial Lower Leg o ABD pad o Dry Gauze o XtraSorb o Other - carboflex Dressing Change Frequency Wound #1 Left,Lateral Lower Leg o Change Dressing Monday, Wednesday, Friday - HHRN to change Wednesday and Fridays and pt to come to [place] on Mondays. Wound #2 Left,Anterior Lower Leg o Change Dressing Monday, Wednesday, Friday - HHRN to change Wednesday and Fridays and pt to come to [place] on Mondays. Wound #3 Left,Medial Lower  Leg o Change Dressing Monday, Wednesday, Friday - HHRN to change Wednesday and Fridays and pt to come to [place] on Mondays. Follow-up Appointments Wound #1 Left,Lateral Lower Leg o Return Appointment in 1 week. Wound #2 Left,Anterior Lower Leg o Return Appointment in 1 week. Wound #3 Left,Medial Lower Leg o Return Appointment in 1 week. Edema Control Wound #1 Left,Lateral Lower Leg o Kerlix and Coban - Left Lower Extremity o Patient to wear own compression stockings - HHRN to order and provide patient with dual layer compression garments for both lower legs - 30-32mmHg compression o Patient to wear own Juxtalite/Juzo compression garment. - HHRN to order velcro compression wrap for both lower legs o Elevate legs to the level of the heart and pump ankles as often as possible Wound #2 Left,Anterior Lower Leg o Kerlix and Coban - Left Lower Extremity HELLENA, PRIDGEN ([id number]) o Patient to wear own compression stockings - HHRN to order and provide patient with dual layer compression garments for both lower legs - 30-55mmHg compression o Patient to wear own Juxtalite/Juzo compression garment. - HHRN to order velcro compression wrap for both lower legs o Elevate legs to the level of the heart and pump ankles as often as  possible Wound #3 Left,Medial Lower Leg o Kerlix and Coban - Left Lower Extremity o Patient to wear own compression stockings - HHRN to order and provide patient with dual layer compression garments for both lower legs - 30-63mmHg compression o Patient to wear own Juxtalite/Juzo compression garment. - HHRN to order velcro compression wrap for both lower legs o Elevate legs to the level of the heart and pump ankles as often as possible Additional Orders / Instructions Wound #1 Left,Lateral Lower Leg o Increase protein intake. Wound #2 Left,Anterior Lower Leg o Increase protein intake. Wound #3 Left,Medial Lower Leg o Increase protein intake. Home Health Wound #1 [place] Nurse may visit PRN to address patientos wound care needs. o FACE TO FACE ENCOUNTER: MEDICARE and MEDICAID PATIENTS: I certify that this patient is under my care and that I had a face-to-face encounter that meets the physician face-to-face encounter requirements with this patient on this date. The encounter with the patient was in whole or in part for the following MEDICAL CONDITION: (primary reason for [place]) MEDICAL NECESSITY: I certify, that based on my findings, NURSING services are a medically necessary home health service. HOME BOUND STATUS: I certify that my clinical findings support that this patient is homebound (i.e., Due to illness or injury, pt requires aid of supportive devices such as crutches, cane, wheelchairs, walkers, the use of special transportation or the assistance of another person to leave their place of residence. There is a normal inability to leave the home and doing so requires considerable and taxing effort. Other absences are for medical reasons / religious services and are infrequent or of short duration when for other reasons). o If current dressing causes regression in wound condition, may D/C ordered  dressing product/s and apply Normal Saline Moist Dressing daily until next [place] / Other MD appointment. [place] of regression in wound condition at [phone]. o Please direct any NON-WOUND related issues/requests for orders to patient's Primary Care Physician Wound #2 [place] Nurse may visit PRN to address patientos wound care needs. o FACE TO FACE ENCOUNTER: MEDICARE and MEDICAID PATIENTS: I certify that this  patient is under my care and that I had a face-to-face encounter that meets the physician face-to-face encounter requirements with this patient on this date. The encounter with the patient was in whole or in part for the following MEDICAL CONDITION: (primary reason for [place]) MEDICAL NECESSITY: I certify, that based on my findings, NURSING services are a medically necessary home health service. HOME BOUND STATUS: I certify that my clinical findings support that this patient is homebound (i.e., Due to illness or injury, pt requires aid of supportive devices such as crutches, cane, wheelchairs, walkers, the use of special transportation or the assistance of another person to leave their place of residence. There is a normal inability to leave the home and doing so requires considerable and taxing effort. Other absences are for medical reasons / religious services and are infrequent or of short duration when for other reasons). CHEVONNE, BOSTROM ([id number]) o If current dressing causes regression in wound condition, may D/C ordered dressing product/s and apply Normal Saline Moist Dressing daily until next [place] / Other MD appointment. [place] of regression in wound condition at [phone]. o Please direct any NON-WOUND related issues/requests for orders to patient's Primary Care Physician Wound #3 Left,Medial Lower Leg o [place] Nurse may visit PRN to address patientos wound care needs. o FACE TO FACE ENCOUNTER: MEDICARE and MEDICAID PATIENTS: I certify that this patient is under my care and that I had a face-to-face encounter that meets the physician face-to-face encounter requirements with this patient on this date. The encounter with the patient was in whole or in part for the following MEDICAL CONDITION: (primary reason for [place]) MEDICAL NECESSITY: I certify, that based on my findings, NURSING services are a medically necessary home health service. HOME BOUND STATUS: I certify that my clinical findings support that this patient is homebound (i.e., Due to illness or injury, pt requires aid of supportive devices such as crutches, cane, wheelchairs, walkers, the use of special transportation or the assistance of another person to leave their place of residence. There is a normal inability to leave the home and doing so requires considerable and taxing effort. Other absences are for medical reasons / religious services and are infrequent or of short duration when for other reasons). o If current dressing causes regression in wound condition, may D/C ordered dressing product/s and apply Normal Saline Moist Dressing daily until next [place] / Other MD appointment. [place] of regression in wound condition at [phone]. o Please direct any NON-WOUND related issues/requests for orders to patient's Primary Care Physician Medications-please add to medication list. Wound #1 Left,Lateral Lower Leg o Santyl Enzymatic Ointment o Other: - Vitamin C, Zinc, Multivitamin Wound #2 Left,Anterior Lower Leg o Other: - Vitamin C, Zinc, Multivitamin Wound #3 Left,Medial Lower Leg o Santyl Enzymatic Ointment o Other: - Vitamin C, Zinc, Multivitamin Electronic Signature(s) Signed: 11/20/2016 4:20:07 PM By: Christin Fudge MD, FACS Signed:  11/20/2016 4:43:33 PM By: Montey Hora Entered By: Montey Hora on 11/20/2016 10:02:31 [name] ([id number]) -------------------------------------------------------------------------------- Problem List Details Patient Name: ROSEALEE, RECINOS B. Date of Service: 11/20/2016 9:15 AM Medical Record Number: [id number] Patient Account Number: [id number] Date of Birth/Sex: 1945/11/20 (71 y.o. 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 Date of Service: 11/20/2016 9:15 AM Medical Record Number: [id number] Patient Account Number: [id number] Date of Birth/Sex: Jan 16, 1945 (71 y.o. Female) Treating RN: Montey Hora Primary Care Provider: Lelon Huh Other Clinician: Referring Provider: Lelon Huh Treating Provider/Extender: Frann Rider in Treatment: 7 Subjective Chief Complaint Information obtained from Patient Patient seen for complaints of Non-Healing Wounds to both lower extremities which she's had for about 2 months with swelling. History of Present Illness (HPI) The following HPI elements were documented for the patient's  wound: Location: both lower extremity swelling with ulceration Quality: Patient reports experiencing a sharp pain to affected area(s). Severity: Patient states wound are getting worse. Duration: Patient has had the wound for > 2 months prior to seeking treatment at the wound center Timing: Pain in wound is constant (hurts all the time) and is worse when she elevates her legs Context: The wound would happen gradually Modifying Factors: Other treatment(s) tried include:she has been on ciprofloxacin and has been applying Silvadene ointment Associated Signs and Symptoms: Patient reports having increase swelling. 71 year old patient who sees her PCP Dr. Lelon Huh was recently evaluated 10 days ago for diabetes mellitus, hypertension, CHF and hyperlipidemia. she also was noted to have ulcerations develop in her legs and she has been applying Silvadene dressings locally. In the past she has refused wound care referrals.her cardiologist Dr. Saunders Revel saw her and put her on 40 mg of furosemide daily. last hemoglobin A1c was 7.7%. she was also placed on ciprofloxacin twice daily for 7 days and a urine culture was recommended. past medical history significant for coronary artery disease, diabetes mellitus, nonischemic cardiomyopathy and pulmonary hypertension. She is also status post heart catheterization and coronary angiography, tubal ligation and breast cyst removal in the past. She has never been a smoker. Patient had arterial studies done which showed bilateral ABIs are artificially elevated due to noncompressible and calcified vessels. Triphasic waveform throughout. Right great toe TBI is elevated while the left is normal. 10/23/2016 -- the patient is rather moribund from several issues including chronic back pain and knee pain and swelling of her legs. The large necrotic area on her left lateral anterior calf was bleeding on touch after washing her leg. There was a spot which needed silver nitrate  cauterization and this was done appropriately. 11/06/2016 -- she is again noted to have friable bleeding from the left lower extremity wounds and this again had to be cauterized with silver nitrate to control the bleeding as pressure itself would not do it. 11/20/2016 -- the right lower extremity is completely healed and we have ordered 30-40 mm compression stocking's in both the dural layer and also a pair of juxta lites. Patient History Information obtained from Patient. Family History Cancer - Mother,Siblings, Diabetes - Mother,Siblings, Heart Disease - Mother, Hypertension - Siblings, No family history of Hereditary Spherocytosis, Kidney Disease, Lung Disease, Seizures, Stroke, Thyroid Problems, Tuberculosis. MAKHIA, VOSLER ([id number]) Social History Never smoker, Marital Status - Married, Alcohol Use - Never, Drug Use - No History, Caffeine Use - Daily. Objective Constitutional Pulse regular. Respirations normal and unlabored. Afebrile.  Vitals Time Taken: 9:32 AM, Height: 64 in, Source: Measured, Weight: 209 lbs, Source: Measured, BMI: 35.9, Temperature: 97.8 F, Pulse: 69 bpm, Respiratory Rate: 18 breaths/min, Blood Pressure: 137/65 mmHg. Eyes Nonicteric. Reactive to light. Ears, Nose, Mouth, and Throat Lips, teeth, and gums WNL.Marland Kitchen Moist mucosa without lesions. Neck supple and nontender. No palpable supraclavicular or cervical adenopathy. Normal sized without goiter. Respiratory WNL. No retractions.. Cardiovascular Pedal Pulses WNL. No clubbing, cyanosis or edema. Lymphatic No adneopathy. No adenopathy. No adenopathy. Musculoskeletal Adexa without tenderness or enlargement.. Digits and nails w/o clubbing, cyanosis, infection, petechiae, ischemia, or inflammatory conditions.Marland Kitchen Psychiatric Judgement and insight Intact.. No evidence of depression, anxiety, or agitation.. General Notes: the lymphoid enema both lower extremities is still significant and on the right she will wear  her own compression stockings. On the left lower extremity debridement was not done and the tissue continues to have a lot of subcutaneous debris for which we will use Santyl ointment locally and a light compression to be changed 3 times a week Integumentary (Hair, Skin) No suspicious lesions. No crepitus or fluctuance. No peri-wound warmth or erythema. No masses.. Wound #1 status is Open. Original cause of wound was Gradually Appeared. The wound is located on the Left,Lateral Lower Leg. The wound measures 7.2cm length x 5.5cm width x 0.3cm depth; 31.102cm^2 area and 9.331cm^3 volume. There is no tunneling or undermining noted. There is a large amount of serosanguineous drainage noted. The wound margin is distinct with the outline attached to the wound base. There is small (1-33%) red granulation within the wound bed. There is a large Yum, Toy B. ([id number]) (67-100%) amount of necrotic tissue within the wound bed including Adherent Slough. The periwound skin appearance exhibited: Maceration, Erythema. The surrounding wound skin color is noted with erythema which is circumferential. Periwound temperature was noted as No Abnormality. Wound #2 status is Open. Original cause of wound was Gradually Appeared. The wound is located on the Left,Anterior Lower Leg. The wound measures 1cm length x 0.5cm width x 0.1cm depth; 0.393cm^2 area and 0.039cm^3 volume. There is no tunneling or undermining noted. There is a large amount of serous drainage noted. The wound margin is distinct with the outline attached to the wound base. There is small (1-33%) pink granulation within the wound bed. There is a large (67-100%) amount of necrotic tissue within the wound bed including Eschar and Adherent Slough. The periwound skin appearance exhibited: Maceration, Erythema. The surrounding wound skin color is noted with erythema which is circumferential. Periwound temperature was noted as No Abnormality. The periwound  has tenderness on palpation. Wound #3 status is Open. Original cause of wound was Gradually Appeared. The wound is located on the Left,Medial Lower Leg. The wound measures 3.1cm length x 2.5cm width x 0.1cm depth; 6.087cm^2 area and 0.609cm^3 volume. There is no tunneling or undermining noted. There is a large amount of serous drainage noted. The wound margin is distinct with the outline attached to the wound base. There is no granulation within the wound bed. There is a large (67-100%) amount of necrotic tissue within the wound bed including Eschar and Adherent Slough. The periwound skin appearance exhibited: Maceration, Erythema. The surrounding wound skin color is noted with erythema which is circumferential. Periwound temperature was noted as No Abnormality. The periwound has tenderness on palpation. Wound #6 status is Healed - Epithelialized. Original cause of wound was Gradually Appeared. The wound is located on the Right,Lateral Lower Leg. The wound measures 0cm length x 0cm width x 0cm depth;  0cm^2 area and 0cm^3 volume. 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 Cleanse wound with mild soap and water May Shower, gently pat wound dry prior to applying new dressing. Wound #2 Left,Anterior Lower Leg: Clean wound with Normal Saline. Cleanse wound with mild soap and water May Shower, gently pat wound dry prior to applying new dressing. Wound #3 Left,Medial Lower Leg: Clean wound with Normal Saline. Cleanse wound with mild soap and water May Shower, gently pat wound dry prior to applying new dressing. Anesthetic: Wound #1 Left,Lateral Lower Leg: MAELEY, MATTON ([id number]) Topical Lidocaine 4% cream applied to wound bed prior to debridement - for clinic use Wound #2 Left,Anterior Lower Leg: Topical Lidocaine 4% cream applied to wound bed prior to debridement - for clinic use Wound #3 Left,Medial Lower Leg: Topical Lidocaine 4% cream applied to wound bed prior to debridement - for clinic use Skin Barriers/Peri-Wound Care: Wound #1 Left,Lateral Lower Leg: Barrier cream Wound #2 Left,Anterior Lower Leg: Barrier cream Wound #3 Left,Medial Lower Leg: Barrier cream Primary Wound Dressing: Wound #1 Left,Lateral Lower Leg: Santyl Ointment Wound #2 Left,Anterior Lower Leg: Santyl Ointment Wound #3 Left,Medial Lower Leg: Santyl Ointment Secondary Dressing: Wound #1 Left,Lateral Lower Leg: ABD pad Dry Gauze XtraSorb Other - carboflex Wound #2 Left,Anterior Lower Leg: ABD pad Dry Gauze XtraSorb Other - carboflex Wound #3 Left,Medial Lower Leg: ABD pad Dry Gauze XtraSorb Other - carboflex Dressing Change Frequency: Wound #1 Left,Lateral Lower Leg: Change Dressing Monday, Wednesday, Friday - HHRN to change Wednesday and Fridays and pt to come to [place] on Mondays. Wound #2 Left,Anterior Lower Leg: Change Dressing Monday, Wednesday, Friday - HHRN to change Wednesday and Fridays and pt to come to [place] on Mondays. Wound #3 Left,Medial Lower Leg: Change Dressing Monday, Wednesday, Friday - HHRN to change Wednesday and Fridays and pt to come to [place] on Mondays. Follow-up Appointments: Wound #1 Left,Lateral Lower Leg: Return Appointment in 1 week. Wound #2 Left,Anterior Lower Leg: Return Appointment in 1 week. Wound #3 Left,Medial Lower Leg: Return Appointment in 1 week. Edema Control: Wound #1 Left,Lateral Lower Leg: Kerlix and Coban - Left Lower Extremity Patient to wear own compression stockings - HHRN to order and provide patient with dual layer compression garments for both lower legs -  30-40mmHg compression Patient to wear own Juxtalite/Juzo compression garment. - HHRN to order velcro compression wrap for both lower legs Elevate legs to the level of the heart and pump ankles as often as possible Wound #2 Left,Anterior Lower Leg: ELOISE, PICONE ([id number]) Kerlix and Coban - Left Lower Extremity Patient to wear own compression stockings - HHRN to order and provide patient with dual layer compression garments for both lower legs - 30-51mmHg compression Patient to wear own Juxtalite/Juzo compression garment. - HHRN to order velcro compression wrap for both lower legs Elevate legs to the level of the heart and pump ankles as often as possible Wound #3 Left,Medial Lower Leg: Kerlix and Coban - Left Lower Extremity Patient to wear own compression stockings - HHRN to order and provide patient with dual layer compression garments for both lower legs - 30-63mmHg compression Patient to wear own Juxtalite/Juzo compression garment. - HHRN  to order velcro compression wrap for both lower legs Elevate legs to the level of the heart and pump ankles as often as possible Additional Orders / Instructions: Wound #1 Left,Lateral Lower Leg: Increase protein intake. Wound #2 Left,Anterior Lower Leg: Increase protein intake. Wound #3 Left,Medial Lower Leg: Increase protein intake. Home Health: Wound #1 Left,Lateral Lower Leg: [place] Nurse may visit PRN to address patient s wound care needs. FACE TO FACE ENCOUNTER: MEDICARE and MEDICAID PATIENTS: I certify that this patient is under my care and that I had a face-to-face encounter that meets the physician face-to-face encounter requirements with this patient on this date. The encounter with the patient was in whole or in part for the following MEDICAL CONDITION: (primary reason for [place]) MEDICAL NECESSITY: I certify, that based on my findings, NURSING services are a medically necessary home health  service. HOME BOUND STATUS: I certify that my clinical findings support that this patient is homebound (i.e., Due to illness or injury, pt requires aid of supportive devices such as crutches, cane, wheelchairs, walkers, the use of special transportation or the assistance of another person to leave their place of residence. There is a normal inability to leave the home and doing so requires considerable and taxing effort. Other absences are for medical reasons / religious services and are infrequent or of short duration when for other reasons). If current dressing causes regression in wound condition, may D/C ordered dressing product/s and apply Normal Saline Moist Dressing daily until next [place] / Other MD appointment. [place] of regression in wound condition at [phone]. Please direct any NON-WOUND related issues/requests for orders to patient's Primary Care Physician Wound #2 Left,Anterior Lower Leg: [place] Nurse may visit PRN to address patient s wound care needs. FACE TO FACE ENCOUNTER: MEDICARE and MEDICAID PATIENTS: I certify that this patient is under my care and that I had a face-to-face encounter that meets the physician face-to-face encounter requirements with this patient on this date. The encounter with the patient was in whole or in part for the following MEDICAL CONDITION: (primary reason for [place]) MEDICAL NECESSITY: I certify, that based on my findings, NURSING services are a medically necessary home health service. HOME BOUND STATUS: I certify that my clinical findings support that this patient is homebound (i.e., Due to illness or injury, pt requires aid of supportive devices such as crutches, cane, wheelchairs, walkers, the use of special transportation or the assistance of another person to leave their place of residence. There is a normal inability to leave the home and doing so requires  considerable and taxing effort. Other absences are for medical reasons / religious services and are infrequent or of short duration when for other reasons). If current dressing causes regression in wound condition, may D/C ordered dressing product/s and apply Normal Saline Moist Dressing daily until next [place] / Other MD appointment. Re[place] of regression in wound condition at [phone]. Please direct any NON-WOUND related issues/requests for orders to patient's Primary Care Physician Wound #3 Left,Medial Lower Leg: [place] Nurse may visit PRN to address patient s wound care needs. FACE TO FACE ENCOUNTER: MEDICARE and MEDICAID PATIENTS: I certify that this patient is under my care and that I had a face-to-face encounter that meets the physician face-to-face encounter requirements with this patient on this date. The encounter with the patient was in whole or  in part for the following MEDICAL CONDITION: (primary reason for Home Healthcare) MEDICAL NECESSITY: I certify, that based on my findings, NURSING services are a medically necessary home health service. HOME BOUND STATUS: I certify that my clinical findings support that this patient is homebound (i.e., Due to illness or injury, pt requires aid of supportive devices such as crutches, cane, wheelchairs, walkers, the use of special ZUMA, HUST B. ([id number]) transportation or the assistance of another person to leave their place of residence. There is a normal inability to leave the home and doing so requires considerable and taxing effort. Other absences are for medical reasons / religious services and are infrequent or of short duration when for other reasons). If current dressing causes regression in wound condition, may D/C ordered dressing product/s and apply Normal Saline Moist Dressing daily until next [place] / Other MD appointment. [place] of regression in wound condition at [phone]. Please direct any NON-WOUND related issues/requests for orders to patient's Primary Care Physician Medications-please add to medication list.: Wound #1 Left,Lateral Lower Leg: Santyl Enzymatic Ointment Other: - Vitamin C, Zinc, Multivitamin Wound #2 Left,Anterior Lower Leg: Other: - Vitamin C, Zinc, Multivitamin Wound #3 Left,Medial Lower Leg: Santyl Enzymatic Ointment Other: - Vitamin C, Zinc, Multivitamin After review today, I have recommended: 1. Santyl ointment to the left lower extremity to be wrapped with Kerlix and Coban to be changed 3 times a week. 2. we have ordered her compression stockings for the right lower extremity both the juxta light and a dual pair of 30-40 mm compression stockings 3. Elevation and exercise discussed with her in great detail 4. her husband who is the caregiver and is at the bedside, has had several questions which I have answered to his satisfaction. 5. Regular visits to the wound center. Electronic Signature(s) Signed: 11/20/2016 10:21:59 AM By: Christin Fudge MD, FACS Entered By: Christin Fudge on 11/20/2016 10:21:59 [name] ([id number]) -------------------------------------------------------------------------------- ROS/PFSH Details Patient Name: MURLEAN, SEELYE B. Date of Service: 11/20/2016 9:15 AM Medical Record Number: [id number] Patient Account Number: [id number] Date of Birth/Sex: [birth year] (71 y.o. Female) Treating RN: Montey Hora Primary Care Provider: Lelon Huh Other Clinician: Referring Provider: Lelon Huh Treating Provider/Extender: Frann Rider in Treatment: 7 Information Obtained From Patient Wound History Do you currently have one or more open woundso Yes How many open wounds do you currently haveo 6 Approximately how long have you had your woundso 2 months How have you been treating your wound(s) until nowo [place] Has your wound(s) ever  healed and then re-openedo No Have you had any lab work done in the past montho No Have you tested positive for an antibiotic resistant organism (MRSA, VRE)o No Have you tested positive for osteomyelitis (bone infection)o No Have you had any tests for circulation on your legso Yes Where was the test doneo [facility] Have you had other problems associated with your woundso Infection, Swelling Respiratory Medical History: Positive for: Asthma Cardiovascular Medical History: Positive for: Congestive Heart Failure; Coronary Artery Disease; Hypertension Endocrine Medical History: Positive for: Type II Diabetes Time with diabetes: a long time Treated with: Oral agents Blood sugar tested every day: No Musculoskeletal Medical History: Positive for: Osteoarthritis Immunizations Pneumococcal Vaccine: Received Pneumococcal Vaccination: Yes Implantable Devices Family and Social History Cancer: Yes - Mother,Siblings; Diabetes: Yes - Mother,Siblings; Heart Disease: Yes - Mother; Hereditary Spherocytosis: No; Hypertension: Yes - Siblings; Kidney Disease: No; Lung Disease: No; Seizures: No; Stroke: No; Thyroid Problems: No; NADJA, LINA B. ([id number])  Tuberculosis: No; Never smoker; Marital Status - Married; Alcohol Use: Never; Drug Use: No History; Caffeine Use: Daily; Financial Concerns: No; Food, Clothing or Shelter Needs: No; Support System Lacking: No; Transportation Concerns: No; Advanced Directives: No; Patient does not want information on Advanced Directives; Do not resuscitate: No; Living Will: No; Medical Power of Attorney: No Physician Affirmation I have reviewed and agree with the above information. Electronic Signature(s) Signed: 11/20/2016 4:20:07 PM By: Christin Fudge MD, FACS Signed: 11/20/2016 4:43:33 PM By: Montey Hora Entered By: Christin Fudge on 11/20/2016 10:19:41 [name]  ([id number]) -------------------------------------------------------------------------------- SuperBill Details Patient Name: BERYL, HORNBERGER B. Date of Service: 11/20/2016 Medical Record Number: [id number] Patient Account Number: [id number] Date of Birth/Sex: 09-08-1945 (71 y.o. 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 Facility Procedures CPT4 Code: [id number] Description: [phone] - WOUND CARE VISIT-LEV 5 EST PT Modifier: Quantity: 1 Physician Procedures CPT4 Code: [id number] Description: 72761 - WC PHYS LEVEL 3 - EST PT ICD-10 Diagnosis Description E11.622 Type 2 diabetes mellitus with other skin ulcer L97.212 Non-pressure chronic ulcer of right calf with fat layer exp I89.0 Lymphedema, not elsewhere classified L97.222  Non-pressure chronic ulcer of left calf with fat layer expo Modifier: osed sed Quantity: 1 Electronic Signature(s) Signed: 11/20/2016 11:49:54 AM By: Montey Hora Signed: 11/20/2016 4:20:07 PM By: Christin Fudge MD, FACS Previous Signature: 11/20/2016 10:22:12 AM Version By: Christin Fudge MD, FACS Entered By: Montey Hora on 11/20/2016 11:49:54

## 2016-11-21 NOTE — Progress Notes (Signed)
NURIA, PHEBUS ([id number]) Visit Report for 11/20/2016 Arrival Information Details Patient Name: [name], [name]. Date of Service: 11/20/2016 9:15 AM Medical Record Number: [id number] Patient Account Number: [id number] Date of Birth/Sex: 04-20-1945 (71 y.o. Female) Treating RN: Montey Hora Primary Care Xela Oregel: Lelon Huh Other Clinician: Referring Dawnna Gritz: Lelon Huh Treating Mikel Hardgrove/Extender: Frann Rider in Treatment: 7 Visit Information History Since Last Visit Added or deleted any medications: No Patient Arrived: Wheel Chair Any new allergies or adverse reactions: No Arrival Time: 09:30 Had a fall or experienced change in No activities of daily living that may affect Accompanied By: spouse risk of falls: Transfer Assistance: None Signs or symptoms of abuse/neglect since last visito No Patient Identification Verified: Yes Hospitalized since last visit: No Secondary Verification Process Completed: Yes Has Dressing in Place as Prescribed: Yes Patient Requires Transmission-Based No Has Compression in Place as Prescribed: Yes Precautions: Pain Present Now: No Patient Has Alerts: Yes Electronic Signature(s) Signed: 11/20/2016 4:43:33 PM By: Montey Hora Entered By: Montey Hora on 11/20/2016 09:31:35 [name] ([id number]) -------------------------------------------------------------------------------- Clinic Level of Care Assessment Details Patient Name: [name] Date of Service: 11/20/2016 9:15 AM Medical Record Number: [id number] Patient Account Number: [id number] Date of Birth/Sex: Mar 03, 1945 (71 y.o. Female) Treating RN: Montey Hora Primary Care Giavanni Odonovan: Lelon Huh Other Clinician: Referring Rachid Parham: Lelon Huh Treating Rozanne Heumann/Extender: Frann Rider in Treatment: 7 Clinic Level of Care Assessment Items TOOL 4 Quantity Score []  - Use when only an EandM is performed on FOLLOW-UP visit 0 ASSESSMENTS - Nursing  Assessment / Reassessment X - Reassessment of Co-morbidities (includes updates in patient status) 1 10 X- 1 5 Reassessment of Adherence to Treatment Plan ASSESSMENTS - Wound and Skin Assessment / Reassessment []  - Simple Wound Assessment / Reassessment - one wound 0 X- 4 5 Complex Wound Assessment / Reassessment - multiple wounds []  - 0 Dermatologic / Skin Assessment (not related to wound area) ASSESSMENTS - Focused Assessment []  - Circumferential Edema Measurements - multi extremities 0 []  - 0 Nutritional Assessment / Counseling / Intervention X- 1 5 Lower Extremity Assessment (monofilament, tuning fork, pulses) []  - 0 Peripheral Arterial Disease Assessment (using hand held doppler) ASSESSMENTS - Ostomy and/or Continence Assessment and Care []  - Incontinence Assessment and Management 0 []  - 0 Ostomy Care Assessment and Management (repouching, etc.) PROCESS - Coordination of Care X - Simple Patient / Family Education for ongoing care 1 15 []  - 0 Complex (extensive) Patient / Family Education for ongoing care []  - 0 Staff obtains Programmer, systems, Records, Test Results / Process Orders []  - 0 Staff telephones HHA, Nursing Homes / Clarify orders / etc []  - 0 Routine Transfer to another Facility (non-emergent condition) []  - 0 Routine Hospital Admission (non-emergent condition) []  - 0 New Admissions / Biomedical engineer / Ordering NPWT, Apligraf, etc. []  - 0 Emergency Hospital Admission (emergent condition) X- 1 10 Simple Discharge Coordination [name], DARKO. ([id number]) []  - 0 Complex (extensive) Discharge Coordination PROCESS - Special Needs []  - Pediatric / Minor Patient Management 0 []  - 0 Isolation Patient Management []  - 0 Hearing / Language / Visual special needs []  - 0 Assessment of Community assistance (transportation, D/C planning, etc.) []  - 0 Additional assistance / Altered mentation []  - 0 Support Surface(s) Assessment (bed, cushion, seat,  etc.) INTERVENTIONS - Wound Cleansing / Measurement []  - Simple Wound Cleansing - one wound 0 X- 4 5 Complex Wound Cleansing - multiple wounds X- 1 5 Wound Imaging (photographs - any number of wounds) []  -  0 Wound Tracing (instead of photographs) []  - 0 Simple Wound Measurement - one wound X- 4 5 Complex Wound Measurement - multiple wounds INTERVENTIONS - Wound Dressings []  - Small Wound Dressing one or multiple wounds 0 X- 3 15 Medium Wound Dressing one or multiple wounds []  - 0 Large Wound Dressing one or multiple wounds []  - 0 Application of Medications - topical []  - 0 Application of Medications - injection INTERVENTIONS - Miscellaneous []  - External ear exam 0 []  - 0 Specimen Collection (cultures, biopsies, blood, body fluids, etc.) []  - 0 Specimen(s) / Culture(s) sent or taken to Lab for analysis []  - 0 Patient Transfer (multiple staff / Civil Service fast streamer / Similar devices) []  - 0 Simple Staple / Suture removal (25 or less) []  - 0 Complex Staple / Suture removal (26 or more) []  - 0 Hypo / Hyperglycemic Management (close monitor of Blood Glucose) []  - 0 Ankle / Brachial Index (ABI) - do not check if billed separately X- 1 5 Vital Signs [name], Jillian B. ([id number]) Has the patient been seen at the hospital within the last three years: Yes Total Score: 160 Level Of Care: New/Established - Level 5 Electronic Signature(s) Signed: 11/20/2016 4:43:33 PM By: Montey Hora Entered By: Montey Hora on 11/20/2016 11:49:40 [name] ([id number]) -------------------------------------------------------------------------------- Encounter Discharge Information Details Patient Name: Jillian Albe B. Date of Service: 11/20/2016 9:15 AM Medical Record Number: [id number] Patient Account Number: [id number] Date of Birth/Sex: 1945-08-07 (71 y.o. Female) Treating RN: Montey Hora Primary Care Shanena Pellegrino: Lelon Huh Other Clinician: Referring Tajon Moring: Lelon Huh Treating  Korynn Kenedy/Extender: Frann Rider in Treatment: 7 Encounter Discharge Information Items Discharge Pain Level: 0 Discharge Condition: Stable Ambulatory Status: Wheelchair Discharge Destination: Home Transportation: Private Auto Accompanied By: spouse Schedule Follow-up Appointment: Yes Medication Reconciliation completed and No provided to Patient/Care Danniella Robben: Provided on Clinical Summary of Care: 11/20/2016 Form Type Recipient Paper Patient CS Electronic Signature(s) Signed: 11/20/2016 11:50:46 AM By: Montey Hora Entered By: Montey Hora on 11/20/2016 11:50:46 [name] ([id number]) -------------------------------------------------------------------------------- Lower Extremity Assessment Details Patient Name: [name], Jillian B. Date of Service: 11/20/2016 9:15 AM Medical Record Number: [id number] Patient Account Number: [id number] Date of Birth/Sex: 13-Mar-1945 (71 y.o. Female) Treating RN: Montey Hora Primary Care Shimon Trowbridge: Lelon Huh Other Clinician: Referring Travis Purk: Lelon Huh Treating Tramain Gershman/Extender: Frann Rider in Treatment: 7 Edema Assessment Assessed: [Left: No] [Right: No] [Left: Edema] [Right: :] Calf Left: Right: Point of Measurement: 28 cm From Medial Instep 46.7 cm 40.8 cm Ankle Left: Right: Point of Measurement: 9 cm From Medial Instep 25.6 cm 24.5 cm Vascular Assessment Pulses: Dorsalis Pedis Palpable: [Left:Yes] [Right:Yes] Posterior Tibial Extremity colors, hair growth, and conditions: Extremity Color: [Left:Red] [Right:Red] Hair Growth on Extremity: [Left:No] [Right:No] Temperature of Extremity: [Left:Warm] [Right:Warm] Capillary Refill: [Left:< 3 seconds] [Right:< 3 seconds] Toe Nail Assessment Left: Right: Thick: Yes Yes Discolored: Yes Yes Deformed: Yes Yes Improper Length and Hygiene: Yes Yes Electronic Signature(s) Signed: 11/20/2016 4:43:33 PM By: Montey Hora Entered By: Montey Hora on  11/20/2016 09:44:16 [name], [name] ([id number]) -------------------------------------------------------------------------------- Multi Wound Chart Details Patient Name: Jillian Albe B. Date of Service: 11/20/2016 9:15 AM Medical Record Number: [id number] Patient Account Number: [id number] Date of Birth/Sex: Mar 14, 1945 (71 y.o. Female) Treating RN: Montey Hora Primary Care Tandra Rosado: Lelon Huh Other Clinician: Referring Nnaemeka Samson: Lelon Huh Treating Lary Eckardt/Extender: Frann Rider in Treatment: 7 Vital Signs Height(in): 64 Pulse(bpm): 14 Weight(lbs): 209 Blood Pressure(mmHg): 137/65 Body Mass Index(BMI): 36 Temperature(F): 97.8 Respiratory Rate 18 (breaths/min): Photos: [1:No Photos] [2:No  Photos] [3:No Photos] Wound Location: [1:Left Lower Leg - Lateral] [2:Left Lower Leg - Anterior] [3:Left Lower Leg - Medial] Wounding Event: [1:Gradually Appeared] [2:Gradually Appeared] [3:Gradually Appeared] Primary Etiology: [1: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] [2:Lower Extremity] [3:Lower Extremity] Comorbid History: [1:Asthma, Congestive Heart Failure, Coronary Artery Disease, Hypertension, Type II Disease, Hypertension, Type II Disease, Hypertension, Type II Diabetes, Osteoarthritis] [2:Asthma, Congestive Heart Failure, Coronary Artery Diabetes,  Osteoarthritis] [3:Asthma, Congestive Heart Failure, Coronary Artery Diabetes, Osteoarthritis] Date Acquired: [1:08/02/2016] [2:08/02/2016] [3:08/02/2016] Weeks of Treatment: [1:7] [2:7] [3:7] Wound Status: [1:Open] [2:Open] [3:Open] Measurements L x W x D [1:7.2x5.5x0.3] [2:1x0.5x0.1] [3:3.1x2.5x0.1] (cm) Area (cm) : [1:31.102] [2:0.393] [3:6.087] Volume (cm) : [1:9.331] [2:0.039] [3:0.609] % Reduction in Area: [1:-80.00%] [2:95.00%] [3:-10.70%] % Reduction in Volume: [1:-440.00%] [2:95.00%] [3:-10.70%] Classification: [1:Grade 1] [2:Grade 1] [3:Grade 1] Exudate  Amount: [1:Large] [2:Large] [3:Large] Exudate Type: [1:Serosanguineous] [2:Serous] [3:Serous] Exudate Color: [1:red, brown] [2:amber] [3:amber] Wound Margin: [1:Distinct, outline attached] [2:Distinct, outline attached] [3:Distinct, outline attached] Granulation Amount: [1:Small (1-33%)] [2:Small (1-33%)] [3:None Present (0%)] Granulation Quality: [1:Red] [2:Pink] [3:N/A] Necrotic Amount: [1:Large (67-100%)] [2:Large (67-100%)] [3:Large (67-100%)] Necrotic Tissue: [1:Adherent Slough] [2:Eschar, Adherent Slough] [3:Eschar, Adherent Slough] Epithelialization: [1:None] [2:None] [3:None] Periwound Skin Texture: [1:No Abnormalities Noted] [2:No Abnormalities Noted] [3:No Abnormalities Noted] Periwound Skin Moisture: [1:Maceration: Yes] [2:Maceration: Yes] [3:Maceration: Yes] Periwound Skin Color: [1:Erythema: Yes] [2:Erythema: Yes] [3:Erythema: Yes] Erythema Location: [1:Circumferential] [2:Circumferential] [3:Circumferential] Temperature: [1:No Abnormality] [2:No Abnormality] [3:No Abnormality] Tenderness on Palpation: [1:No] [2:Yes] [3:Yes] Wound Preparation: [1:Ulcer Cleansing: Rinsed/Irrigated with Saline, Other: soap and water] [2:Ulcer Cleansing: Rinsed/Irrigated with Saline, Other: soap and water] [3:Ulcer Cleansing: Rinsed/Irrigated with Saline, Other: soap and water] Topical Anesthetic Applied: Topical Anesthetic Applied: Topical Anesthetic Applied: Other: lidocaine 4% Other: lidocaine 4% Other: lidocaine 4% Wound Number: 6 N/A N/A Photos: No Photos N/A N/A Wound Location: Right, Lateral Lower Leg N/A N/A Wounding Event: Gradually Appeared N/A N/A Primary Etiology: Diabetic Wound/Ulcer of the N/A N/A Lower Extremity Comorbid History: N/A N/A N/A Date Acquired: 08/02/2016 N/A N/A Weeks of Treatment: 7 N/A N/A Wound Status: Healed - Epithelialized N/A N/A Measurements L x W x D 0x0x0 N/A N/A (cm) Area (cm) : 0 N/A N/A Volume (cm) : 0 N/A N/A % Reduction in Area: 100.00%  N/A N/A % Reduction in Volume: 100.00% N/A N/A Classification: Grade 1 N/A N/A Exudate Amount: N/A N/A N/A Exudate Type: N/A N/A N/A Exudate Color: N/A N/A N/A Wound Margin: N/A N/A N/A Granulation Amount: N/A N/A N/A Granulation Quality: N/A N/A N/A Necrotic Amount: N/A N/A N/A Necrotic Tissue: N/A N/A N/A Epithelialization: N/A N/A N/A Periwound Skin Texture: No Abnormalities Noted N/A N/A Periwound Skin Moisture: No Abnormalities Noted N/A N/A Periwound Skin Color: No Abnormalities Noted N/A N/A Erythema Location: N/A N/A N/A Temperature: N/A N/A N/A Tenderness on Palpation: No N/A N/A Wound Preparation: N/A N/A N/A Treatment Notes Electronic Signature(s) Signed: 11/20/2016 10:18:32 AM By: Christin Fudge MD, FACS Entered By: Christin Fudge on 11/20/2016 10:18:32 [name] ([id number]) -------------------------------------------------------------------------------- [place] Plan Details Patient Name: [name], Jillian B. Date of Service: 11/20/2016 9:15 AM Medical Record Number: [id number] Patient Account Number: [id number] Date of Birth/Sex: [birth year] (71 y.o. Female) Treating RN: Montey Hora Primary Care Adham Johnson: Lelon Huh Other Clinician: Referring Tadd Holtmeyer: Lelon Huh Treating Ericha Whittingham/Extender: Frann Rider in Treatment: 7 Active Inactive ` Abuse / Safety / Falls / Self Care Management Nursing Diagnoses: History of Falls Potential for falls Goals: Patient will not experience any injury related to falls  Date Initiated: 10/02/2016 Target Resolution Date: 01/06/2017 Goal Status: Active Interventions: Assess: immobility, friction, shearing, incontinence upon admission and as needed Assess impairment of mobility on admission and as needed per policy Assess personal safety and home safety (as indicated) on admission and as needed Notes: ` Nutrition Nursing Diagnoses: Imbalanced nutrition Impaired glucose control: actual or  potential Potential for alteratiion in Nutrition/Potential for imbalanced nutrition Goals: Patient/caregiver agrees to and verbalizes understanding of need to use nutritional supplements and/or vitamins as prescribed Date Initiated: 10/02/2016 Target Resolution Date: 02/10/2017 Goal Status: Active Patient/caregiver will maintain therapeutic glucose control Date Initiated: 10/02/2016 Target Resolution Date: 01/06/2017 Goal Status: Active Interventions: Assess patient nutrition upon admission and as needed per policy Provide education on elevated blood sugars and impact on wound healing Provide education on nutrition Treatment Activities: Education provided on Nutrition : 10/02/2016 SKARLETH, DELMONICO ([id number]) Notes: ` Orientation to the Wound Care Program Nursing Diagnoses: Knowledge deficit related to the wound healing center program Goals: Patient/caregiver will verbalize understanding of the [place] Date Initiated: 10/02/2016 Target Resolution Date: 11/11/2016 Goal Status: Active Interventions: Provide education on orientation to the wound center Notes: ` Pain, Acute or Chronic Nursing Diagnoses: Pain, acute or chronic: actual or potential Potential alteration in comfort, pain Goals: Patient/caregiver will verbalize adequate pain control between visits Date Initiated: 10/02/2016 Target Resolution Date: 02/10/2017 Goal Status: Active Interventions: Complete pain assessment as per visit requirements Notes: ` Wound/Skin Impairment Nursing Diagnoses: Impaired tissue integrity Knowledge deficit related to ulceration/compromised skin integrity Goals: Ulcer/skin breakdown will have a volume reduction of 80% by week 12 Date Initiated: 10/02/2016 Target Resolution Date: 02/03/2017 Goal Status: Active Interventions: Assess patient/caregiver ability to perform ulcer/skin care regimen upon admission and as needed Assess ulceration(s) every visit Notes: [name], [name] ([id number]) Electronic Signature(s) Signed: 11/20/2016 4:43:33 PM By: Montey Hora Entered By: Montey Hora on 11/20/2016 09:55:42 [name] ([id number]) -------------------------------------------------------------------------------- Pain Assessment Details Patient Name: Jillian Albe B. Date of Service: 11/20/2016 9:15 AM Medical Record Number: [id number] Patient Account Number: [id number] Date of Birth/Sex: [birth year] (71 y.o. Female) Treating RN: Montey Hora Primary Care Britni Driscoll: Lelon Huh Other Clinician: Referring Jaymes Hang: Lelon Huh Treating Jomo Forand/Extender: Frann Rider in Treatment: 7 Active Problems Location of Pain Severity and Description of Pain Patient Has Paino No Site Locations Pain Management and Medication Current Pain Management: Notes Topical or injectable lidocaine is offered to patient for acute pain when surgical debridement is performed. If needed, Patient is instructed to use over the counter pain medication for the following 24-48 hours after debridement. Wound care MDs do not prescribed pain medications. Patient has chronic pain or uncontrolled pain. Patient has been instructed to make an appointment with their Primary Care Physician for pain management. Electronic Signature(s) Signed: 11/20/2016 4:43:33 PM By: Montey Hora Entered By: Montey Hora on 11/20/2016 09:31:44 [name] ([id number]) -------------------------------------------------------------------------------- Patient/Caregiver Education Details Patient Name: [name] Date of Service: 11/20/2016 9:15 AM Medical Record Number: [id number] Patient Account Number: [id number] Date of Birth/Gender: November 25, 1945 (71 y.o. Female) Treating RN: Montey Hora Primary Care Physician: Lelon Huh Other Clinician: Referring Physician: Lelon Huh Treating Physician/Extender: Frann Rider in Treatment: 7 Education Assessment Education  Provided To: Patient and Caregiver Education Topics Provided Venous: Handouts: Other: leg elevation Methods: Explain/Verbal Responses: State content correctly Electronic Signature(s) Signed: 11/20/2016 4:43:33 PM By: Montey Hora Entered By: Montey Hora on 11/20/2016 11:51:02 [name] ([id number]) -------------------------------------------------------------------------------- Wound Assessment Details Patient Name: CEYDA, PETERKA B. Date of Service: 11/20/2016 9:15  AM Medical Record Number: [id number] Patient Account Number: [id number] Date of Birth/Sex: 1945/01/17 (71 y.o. Female) Treating RN: Montey Hora Primary Care Leahann Lempke: Lelon Huh Other Clinician: Referring Quincey Quesinberry: Lelon Huh Treating Rylyn Zawistowski/Extender: Frann Rider in Treatment: 7 Wound Status Wound Number: 1 Primary Diabetic Wound/Ulcer of the Lower Extremity Etiology: Wound Location: Left Lower Leg - Lateral Wound Open Wounding Event: Gradually Appeared Status: Date Acquired: 08/02/2016 Comorbid Asthma, Congestive Heart Failure, Coronary Weeks Of Treatment: 7 History: Artery Disease, Hypertension, Type II Diabetes, Clustered Wound: No Osteoarthritis Photos Photo Uploaded By: Montey Hora on 11/20/2016 13:41:22 Wound Measurements Length: (cm) 7.2 Width: (cm) 5.5 Depth: (cm) 0.3 Area: (cm) 31.102 Volume: (cm) 9.331 % Reduction in Area: -80% % Reduction in Volume: -440% Epithelialization: None Tunneling: No Undermining: No Wound Description Classification: Grade 1 Wound Margin: Distinct, outline attached Exudate Amount: Large Exudate Type: Serosanguineous Exudate Color: red, brown Foul Odor After Cleansing: No Slough/Fibrino Yes Wound Bed Granulation Amount: Small (1-33%) Granulation Quality: Red Necrotic Amount: Large (67-100%) Necrotic Quality: Adherent Slough Periwound Skin Texture Texture Color No Abnormalities Noted: No No Abnormalities Noted: No Erythema:  Yes Moisture [name], Jillian B. ([id number]) No Abnormalities Noted: No Erythema Location: Circumferential Maceration: Yes Temperature / Pain Temperature: No Abnormality Wound Preparation Ulcer Cleansing: Rinsed/Irrigated with Saline, Other: soap and water, Topical Anesthetic Applied: Other: lidocaine 4%, Treatment Notes Wound #1 (Left, Lateral Lower Leg) 1. Cleansed with: Clean wound with Normal Saline Cleanse wound with antibacterial soap and water 2. Anesthetic Topical Lidocaine 4% cream to wound bed prior to debridement 3. Peri-wound Care: Barrier cream 4. Dressing Applied: Santyl Ointment 5. Secondary Dressing Applied ABD Pad Dry Gauze Notes xtrasorb, kerlix, coban, unna to anchor, carboflex Electronic Signature(s) Signed: 11/20/2016 4:43:33 PM By: Montey Hora Entered By: Montey Hora on 11/20/2016 09:55:04 [name] ([id number]) -------------------------------------------------------------------------------- Wound Assessment Details Patient Name: MACKENA, PLUMMER B. Date of Service: 11/20/2016 9:15 AM Medical Record Number: [id number] Patient Account Number: [id number] Date of Birth/Sex: 04/03/1945 (71 y.o. Female) Treating RN: Montey Hora Primary Care Jackqulyn Mendel: Lelon Huh Other Clinician: Referring Tu Bayle: Lelon Huh Treating Veroncia Jezek/Extender: Frann Rider in Treatment: 7 Wound Status Wound Number: 2 Primary Diabetic Wound/Ulcer of the Lower Extremity Etiology: Wound Location: Left Lower Leg - Anterior Wound Open Wounding Event: Gradually Appeared Status: Date Acquired: 08/02/2016 Comorbid Asthma, Congestive Heart Failure, Coronary Weeks Of Treatment: 7 History: Artery Disease, Hypertension, Type II Diabetes, Clustered Wound: No Osteoarthritis Photos Photo Uploaded By: Montey Hora on 11/20/2016 13:42:02 Wound Measurements Length: (cm) 1 Width: (cm) 0.5 Depth: (cm) 0.1 Area: (cm) 0.393 Volume: (cm) 0.039 % Reduction in  Area: 95% % Reduction in Volume: 95% Epithelialization: None Tunneling: No Undermining: No Wound Description Classification: Grade 1 Wound Margin: Distinct, outline attached Exudate Amount: Large Exudate Type: Serous Exudate Color: amber Foul Odor After Cleansing: No Slough/Fibrino Yes Wound Bed Granulation Amount: Small (1-33%) Granulation Quality: Pink Necrotic Amount: Large (67-100%) Necrotic Quality: Eschar, Adherent Slough Periwound Skin Texture Texture Color No Abnormalities Noted: No No Abnormalities Noted: No Erythema: Yes Moisture [name], Jillian B. ([id number]) No Abnormalities Noted: No Erythema Location: Circumferential Maceration: Yes Temperature / Pain Temperature: No Abnormality Tenderness on Palpation: Yes Wound Preparation Ulcer Cleansing: Rinsed/Irrigated with Saline, Other: soap and water, Topical Anesthetic Applied: Other: lidocaine 4%, Treatment Notes Wound #2 (Left, Anterior Lower Leg) 1. Cleansed with: Clean wound with Normal Saline Cleanse wound with antibacterial soap and water 2. Anesthetic Topical Lidocaine 4% cream to wound bed prior to debridement 3. Peri-wound Care: Barrier cream 4. Dressing Applied:  Santyl Ointment 5. Secondary Dressing Applied ABD Pad Dry Gauze Notes xtrasorb, kerlix, coban, unna to anchor, carboflex Electronic Signature(s) Signed: 11/20/2016 4:43:33 PM By: Montey Hora Entered By: Montey Hora on 11/20/2016 09:55:19 [name] ([id number]) -------------------------------------------------------------------------------- Wound Assessment Details Patient Name: HADDY, MULLINAX B. Date of Service: 11/20/2016 9:15 AM Medical Record Number: [id number] Patient Account Number: [id number] Date of Birth/Sex: 1945-06-11 (71 y.o. Female) Treating RN: Montey Hora Primary Care Abdinasir Spadafore: Lelon Huh Other Clinician: Referring Legaci Tarman: Lelon Huh Treating Joaquim Tolen/Extender: Frann Rider in Treatment:  7 Wound Status Wound Number: 3 Primary Diabetic Wound/Ulcer of the Lower Extremity Etiology: Wound Location: Left Lower Leg - Medial Wound Open Wounding Event: Gradually Appeared Status: Date Acquired: 08/02/2016 Comorbid Asthma, Congestive Heart Failure, Coronary Weeks Of Treatment: 7 History: Artery Disease, Hypertension, Type II Diabetes, Clustered Wound: No Osteoarthritis Photos Photo Uploaded By: Montey Hora on 11/20/2016 13:42:03 Wound Measurements Length: (cm) 3.1 Width: (cm) 2.5 Depth: (cm) 0.1 Area: (cm) 6.087 Volume: (cm) 0.609 % Reduction in Area: -10.7% % Reduction in Volume: -10.7% Epithelialization: None Tunneling: No Undermining: No Wound Description Classification: Grade 1 Wound Margin: Distinct, outline attached Exudate Amount: Large Exudate Type: Serous Exudate Color: amber Foul Odor After Cleansing: No Slough/Fibrino Yes Wound Bed Granulation Amount: None Present (0%) Necrotic Amount: Large (67-100%) Necrotic Quality: Eschar, Adherent Slough Periwound Skin Texture Texture Color No Abnormalities Noted: No No Abnormalities Noted: No Erythema: Yes Moisture Erythema Location: Circumferential No Abnormalities Noted: No [name], Jillian B. ([id number]) Maceration: Yes Temperature / Pain Temperature: No Abnormality Tenderness on Palpation: Yes Wound Preparation Ulcer Cleansing: Rinsed/Irrigated with Saline, Other: soap and water, Topical Anesthetic Applied: Other: lidocaine 4%, Treatment Notes Wound #3 (Left, Medial Lower Leg) 1. Cleansed with: Clean wound with Normal Saline Cleanse wound with antibacterial soap and water 2. Anesthetic Topical Lidocaine 4% cream to wound bed prior to debridement 3. Peri-wound Care: Barrier cream 4. Dressing Applied: Santyl Ointment 5. Secondary Dressing Applied ABD Pad Dry Gauze Notes xtrasorb, kerlix, coban, unna to anchor, carboflex Electronic Signature(s) Signed: 11/20/2016 4:43:33 PM By: Montey Hora Entered By: Montey Hora on 11/20/2016 09:55:32 [place], [name] ([id number]) -------------------------------------------------------------------------------- Wound Assessment Details Patient Name: SHERANDA, SEABROOKS B. Date of Service: 11/20/2016 9:15 AM Medical Record Number: [id number] Patient Account Number: [id number] Date of Birth/Sex: 1945-09-09 (71 y.o. Female) Treating RN: Montey Hora Primary Care Denyce Harr: Lelon Huh Other Clinician: Referring Aalia Greulich: Lelon Huh Treating Denzel Etienne/Extender: Frann Rider in Treatment: 7 Wound Status Wound Number: 6 Primary Diabetic Wound/Ulcer of the Lower Etiology: Extremity Wound Location: Right, Lateral Lower Leg Wound Status: Healed - Epithelialized Wounding Event: Gradually Appeared Date Acquired: 08/02/2016 Weeks Of Treatment: 7 Clustered Wound: No Photos Photo Uploaded By: Montey Hora on 11/20/2016 13:42:45 Wound Measurements Length: (cm) 0 % Width: (cm) 0 % Depth: (cm) 0 Area: (cm) 0 Volume: (cm) 0 Reduction in Area: 100% Reduction in Volume: 100% Wound Description Classification: Grade 1 Periwound Skin Texture Texture Color No Abnormalities Noted: No No Abnormalities Noted: No Moisture No Abnormalities Noted: No Electronic Signature(s) Signed: 11/20/2016 4:43:33 PM By: Montey Hora Entered By: Montey Hora on 11/20/2016 09:49:41 Manzano, [name] ([id number]) -------------------------------------------------------------------------------- [place] Details Patient Name: Jillian Albe B. Date of Service: 11/20/2016 9:15 AM Medical Record Number: [id number] Patient Account Number: [id number] Date of Birth/Sex: 1945-03-13 (71 y.o. Female) Treating RN: Montey Hora Primary Care Ethelean Colla: Lelon Huh Other Clinician: Referring Hamdan Toscano: Lelon Huh Treating Keyvon Herter/Extender: Frann Rider in Treatment: 7 Vital Signs Time Taken: 09:32 Temperature (F): 97.8 Height (in): 64 Pulse  (  bpm): 69 Source: Measured Respiratory Rate (breaths/min): 18 Weight (lbs): 209 Blood Pressure (mmHg): 137/65 Source: Measured Reference Range: 80 - 120 mg / dl Body Mass Index (BMI): 35.9 Electronic Signature(s) Signed: 11/20/2016 4:43:33 PM By: Montey Hora Entered By: Montey Hora on 11/20/2016 09:35:07

## 2016-11-22 DIAGNOSIS — Z79891 Long term (current) use of opiate analgesic: Secondary | ICD-10-CM | POA: Diagnosis not present

## 2016-11-22 DIAGNOSIS — I110 Hypertensive heart disease with heart failure: Secondary | ICD-10-CM | POA: Diagnosis not present

## 2016-11-22 DIAGNOSIS — Z7984 Long term (current) use of oral hypoglycemic drugs: Secondary | ICD-10-CM | POA: Diagnosis not present

## 2016-11-22 DIAGNOSIS — L97212 Non-pressure chronic ulcer of right calf with fat layer exposed: Secondary | ICD-10-CM | POA: Diagnosis not present

## 2016-11-22 DIAGNOSIS — L97222 Non-pressure chronic ulcer of left calf with fat layer exposed: Secondary | ICD-10-CM | POA: Diagnosis not present

## 2016-11-22 DIAGNOSIS — J45909 Unspecified asthma, uncomplicated: Secondary | ICD-10-CM | POA: Diagnosis not present

## 2016-11-22 DIAGNOSIS — Z4800 Encounter for change or removal of nonsurgical wound dressing: Secondary | ICD-10-CM | POA: Diagnosis not present

## 2016-11-22 DIAGNOSIS — L97211 Non-pressure chronic ulcer of right calf limited to breakdown of skin: Secondary | ICD-10-CM | POA: Diagnosis not present

## 2016-11-22 DIAGNOSIS — I2510 Atherosclerotic heart disease of native coronary artery without angina pectoris: Secondary | ICD-10-CM | POA: Diagnosis not present

## 2016-11-22 DIAGNOSIS — M1990 Unspecified osteoarthritis, unspecified site: Secondary | ICD-10-CM | POA: Diagnosis not present

## 2016-11-22 DIAGNOSIS — E11622 Type 2 diabetes mellitus with other skin ulcer: Secondary | ICD-10-CM | POA: Diagnosis not present

## 2016-11-22 DIAGNOSIS — I509 Heart failure, unspecified: Secondary | ICD-10-CM | POA: Diagnosis not present

## 2016-11-22 DIAGNOSIS — I429 Cardiomyopathy, unspecified: Secondary | ICD-10-CM | POA: Diagnosis not present

## 2016-11-22 DIAGNOSIS — I272 Pulmonary hypertension, unspecified: Secondary | ICD-10-CM | POA: Diagnosis not present

## 2016-11-24 DIAGNOSIS — Z4800 Encounter for change or removal of nonsurgical wound dressing: Secondary | ICD-10-CM | POA: Diagnosis not present

## 2016-11-24 DIAGNOSIS — Z7984 Long term (current) use of oral hypoglycemic drugs: Secondary | ICD-10-CM | POA: Diagnosis not present

## 2016-11-24 DIAGNOSIS — I110 Hypertensive heart disease with heart failure: Secondary | ICD-10-CM | POA: Diagnosis not present

## 2016-11-24 DIAGNOSIS — J45909 Unspecified asthma, uncomplicated: Secondary | ICD-10-CM | POA: Diagnosis not present

## 2016-11-24 DIAGNOSIS — I272 Pulmonary hypertension, unspecified: Secondary | ICD-10-CM | POA: Diagnosis not present

## 2016-11-24 DIAGNOSIS — I429 Cardiomyopathy, unspecified: Secondary | ICD-10-CM | POA: Diagnosis not present

## 2016-11-24 DIAGNOSIS — I509 Heart failure, unspecified: Secondary | ICD-10-CM | POA: Diagnosis not present

## 2016-11-24 DIAGNOSIS — M1990 Unspecified osteoarthritis, unspecified site: Secondary | ICD-10-CM | POA: Diagnosis not present

## 2016-11-24 DIAGNOSIS — L97211 Non-pressure chronic ulcer of right calf limited to breakdown of skin: Secondary | ICD-10-CM | POA: Diagnosis not present

## 2016-11-24 DIAGNOSIS — Z79891 Long term (current) use of opiate analgesic: Secondary | ICD-10-CM | POA: Diagnosis not present

## 2016-11-24 DIAGNOSIS — L97212 Non-pressure chronic ulcer of right calf with fat layer exposed: Secondary | ICD-10-CM | POA: Diagnosis not present

## 2016-11-24 DIAGNOSIS — I2510 Atherosclerotic heart disease of native coronary artery without angina pectoris: Secondary | ICD-10-CM | POA: Diagnosis not present

## 2016-11-24 DIAGNOSIS — E11622 Type 2 diabetes mellitus with other skin ulcer: Secondary | ICD-10-CM | POA: Diagnosis not present

## 2016-11-24 DIAGNOSIS — L97222 Non-pressure chronic ulcer of left calf with fat layer exposed: Secondary | ICD-10-CM | POA: Diagnosis not present

## 2016-11-27 DIAGNOSIS — Z79891 Long term (current) use of opiate analgesic: Secondary | ICD-10-CM | POA: Diagnosis not present

## 2016-11-27 DIAGNOSIS — L97212 Non-pressure chronic ulcer of right calf with fat layer exposed: Secondary | ICD-10-CM | POA: Diagnosis not present

## 2016-11-27 DIAGNOSIS — L97211 Non-pressure chronic ulcer of right calf limited to breakdown of skin: Secondary | ICD-10-CM | POA: Diagnosis not present

## 2016-11-27 DIAGNOSIS — I2510 Atherosclerotic heart disease of native coronary artery without angina pectoris: Secondary | ICD-10-CM | POA: Diagnosis not present

## 2016-11-27 DIAGNOSIS — L97222 Non-pressure chronic ulcer of left calf with fat layer exposed: Secondary | ICD-10-CM | POA: Diagnosis not present

## 2016-11-27 DIAGNOSIS — Z4800 Encounter for change or removal of nonsurgical wound dressing: Secondary | ICD-10-CM | POA: Diagnosis not present

## 2016-11-27 DIAGNOSIS — I110 Hypertensive heart disease with heart failure: Secondary | ICD-10-CM | POA: Diagnosis not present

## 2016-11-27 DIAGNOSIS — I509 Heart failure, unspecified: Secondary | ICD-10-CM | POA: Diagnosis not present

## 2016-11-27 DIAGNOSIS — I272 Pulmonary hypertension, unspecified: Secondary | ICD-10-CM | POA: Diagnosis not present

## 2016-11-27 DIAGNOSIS — Z7984 Long term (current) use of oral hypoglycemic drugs: Secondary | ICD-10-CM | POA: Diagnosis not present

## 2016-11-27 DIAGNOSIS — E11622 Type 2 diabetes mellitus with other skin ulcer: Secondary | ICD-10-CM | POA: Diagnosis not present

## 2016-11-27 DIAGNOSIS — I429 Cardiomyopathy, unspecified: Secondary | ICD-10-CM | POA: Diagnosis not present

## 2016-11-27 DIAGNOSIS — J45909 Unspecified asthma, uncomplicated: Secondary | ICD-10-CM | POA: Diagnosis not present

## 2016-11-27 DIAGNOSIS — M1990 Unspecified osteoarthritis, unspecified site: Secondary | ICD-10-CM | POA: Diagnosis not present

## 2016-11-29 DIAGNOSIS — J45909 Unspecified asthma, uncomplicated: Secondary | ICD-10-CM | POA: Diagnosis not present

## 2016-11-29 DIAGNOSIS — Z4800 Encounter for change or removal of nonsurgical wound dressing: Secondary | ICD-10-CM | POA: Diagnosis not present

## 2016-11-29 DIAGNOSIS — I509 Heart failure, unspecified: Secondary | ICD-10-CM | POA: Diagnosis not present

## 2016-11-29 DIAGNOSIS — L97212 Non-pressure chronic ulcer of right calf with fat layer exposed: Secondary | ICD-10-CM | POA: Diagnosis not present

## 2016-11-29 DIAGNOSIS — L97211 Non-pressure chronic ulcer of right calf limited to breakdown of skin: Secondary | ICD-10-CM | POA: Diagnosis not present

## 2016-11-29 DIAGNOSIS — E11622 Type 2 diabetes mellitus with other skin ulcer: Secondary | ICD-10-CM | POA: Diagnosis not present

## 2016-11-29 DIAGNOSIS — I429 Cardiomyopathy, unspecified: Secondary | ICD-10-CM | POA: Diagnosis not present

## 2016-11-29 DIAGNOSIS — Z7984 Long term (current) use of oral hypoglycemic drugs: Secondary | ICD-10-CM | POA: Diagnosis not present

## 2016-11-29 DIAGNOSIS — I2510 Atherosclerotic heart disease of native coronary artery without angina pectoris: Secondary | ICD-10-CM | POA: Diagnosis not present

## 2016-11-29 DIAGNOSIS — I272 Pulmonary hypertension, unspecified: Secondary | ICD-10-CM | POA: Diagnosis not present

## 2016-11-29 DIAGNOSIS — I110 Hypertensive heart disease with heart failure: Secondary | ICD-10-CM | POA: Diagnosis not present

## 2016-11-29 DIAGNOSIS — Z79891 Long term (current) use of opiate analgesic: Secondary | ICD-10-CM | POA: Diagnosis not present

## 2016-11-29 DIAGNOSIS — M1990 Unspecified osteoarthritis, unspecified site: Secondary | ICD-10-CM | POA: Diagnosis not present

## 2016-11-29 DIAGNOSIS — L97222 Non-pressure chronic ulcer of left calf with fat layer exposed: Secondary | ICD-10-CM | POA: Diagnosis not present

## 2016-12-01 ENCOUNTER — Encounter: Payer: Medicare Other | Admitting: Surgery

## 2016-12-01 DIAGNOSIS — I272 Pulmonary hypertension, unspecified: Secondary | ICD-10-CM | POA: Diagnosis not present

## 2016-12-01 DIAGNOSIS — M1990 Unspecified osteoarthritis, unspecified site: Secondary | ICD-10-CM | POA: Diagnosis not present

## 2016-12-01 DIAGNOSIS — L97222 Non-pressure chronic ulcer of left calf with fat layer exposed: Secondary | ICD-10-CM | POA: Diagnosis not present

## 2016-12-01 DIAGNOSIS — I110 Hypertensive heart disease with heart failure: Secondary | ICD-10-CM | POA: Diagnosis not present

## 2016-12-01 DIAGNOSIS — J45909 Unspecified asthma, uncomplicated: Secondary | ICD-10-CM | POA: Diagnosis not present

## 2016-12-01 DIAGNOSIS — I890 Lymphedema, not elsewhere classified: Secondary | ICD-10-CM | POA: Diagnosis not present

## 2016-12-01 DIAGNOSIS — Z79899 Other long term (current) drug therapy: Secondary | ICD-10-CM | POA: Diagnosis not present

## 2016-12-01 DIAGNOSIS — I2510 Atherosclerotic heart disease of native coronary artery without angina pectoris: Secondary | ICD-10-CM | POA: Diagnosis not present

## 2016-12-01 DIAGNOSIS — Z7984 Long term (current) use of oral hypoglycemic drugs: Secondary | ICD-10-CM | POA: Diagnosis not present

## 2016-12-01 DIAGNOSIS — I429 Cardiomyopathy, unspecified: Secondary | ICD-10-CM | POA: Diagnosis not present

## 2016-12-01 DIAGNOSIS — I509 Heart failure, unspecified: Secondary | ICD-10-CM | POA: Diagnosis not present

## 2016-12-01 DIAGNOSIS — E11622 Type 2 diabetes mellitus with other skin ulcer: Secondary | ICD-10-CM | POA: Diagnosis not present

## 2016-12-01 DIAGNOSIS — Z880 Allergy status to penicillin: Secondary | ICD-10-CM | POA: Diagnosis not present

## 2016-12-01 DIAGNOSIS — L97822 Non-pressure chronic ulcer of other part of left lower leg with fat layer exposed: Secondary | ICD-10-CM | POA: Diagnosis not present

## 2016-12-01 DIAGNOSIS — L97212 Non-pressure chronic ulcer of right calf with fat layer exposed: Secondary | ICD-10-CM | POA: Diagnosis not present

## 2016-12-03 NOTE — Progress Notes (Signed)
Jillian, Watson ([id number]) Visit Report for 12/01/2016 Arrival Information Details Patient Name: [name], [name]. Date of Service: 12/01/2016 10:00 AM Medical Record Number: [id number] Patient Account Number: [id number] Date of Birth/Sex: 1945-11-01 (71 y.o. Female) Treating RN: Montey Hora Primary Care Willadeen Colantuono: Lelon Huh Other Clinician: Referring Amadi Yoshino: Lelon Huh Treating Dia Donate/Extender: Frann Rider in Treatment: 8 Visit Information History Since Last Visit Added or deleted any medications: No Patient Arrived: Wheel Chair Any new allergies or adverse reactions: No Arrival Time: 09:56 Had a fall or experienced change in No activities of daily living that may affect Accompanied By: husband risk of falls: Transfer Assistance: Manual Signs or symptoms of abuse/neglect since last visito No Patient Identification Verified: Yes Hospitalized since last visit: No Secondary Verification Process Completed: Yes Has Dressing in Place as Prescribed: Yes Patient Requires Transmission-Based No Has Compression in Place as Prescribed: Yes Precautions: Pain Present Now: No Patient Has Alerts: Yes Electronic Signature(s) Signed: 12/01/2016 4:46:33 PM By: Montey Hora Entered By: Montey Hora on 12/01/2016 09:57:59 [name] ([id number]) -------------------------------------------------------------------------------- Encounter Discharge Information Details Patient Name: Jillian, [name] Date of Service: 12/01/2016 10:00 AM Medical Record Number: [id number] Patient Account Number: [id number] Date of Birth/Sex: 21-Dec-1945 (71 y.o. Female) Treating RN: Montey Hora Primary Care Tomeko Scoville: Lelon Huh Other Clinician: Referring Shamira Toutant: Lelon Huh Treating Karisa Nesser/Extender: Frann Rider in Treatment: 8 Encounter Discharge Information Items Discharge Pain Level: 0 Discharge Condition: Stable Ambulatory Status: Wheelchair Discharge  Destination: Home Private Transportation: Auto Accompanied By: spouse Schedule Follow-up Appointment: Yes Medication Reconciliation completed and provided No to Patient/Care Cassidee Deats: Clinical Summary of Care: Electronic Signature(s) Signed: 12/01/2016 12:01:32 PM By: Montey Hora Entered By: Montey Hora on 12/01/2016 12:01:32 [name] ([id number]) -------------------------------------------------------------------------------- Lower Extremity Assessment Details Patient Name: Jillian, [name] Date of Service: 12/01/2016 10:00 AM Medical Record Number: [id number] Patient Account Number: [id number] Date of Birth/Sex: [birth year] (71 y.o. Female) Treating RN: Montey Hora Primary Care Kenia Teagarden: Lelon Huh Other Clinician: Referring Sekou Zuckerman: Lelon Huh Treating Sulay Brymer/Extender: Frann Rider in Treatment: 8 Edema Assessment Assessed: [Left: No] [Right: No] [Left: Edema] [Right: :] Calf Left: Right: Point of Measurement: 28 cm From Medial Instep 41.9 cm cm Ankle Left: Right: Point of Measurement: 9 cm From Medial Instep 25.4 cm cm Vascular Assessment Pulses: Dorsalis Pedis Palpable: [Left:Yes] Posterior Tibial Extremity colors, hair growth, and conditions: Extremity Color: [Left:Hyperpigmented] Hair Growth on Extremity: [Left:No] Temperature of Extremity: [Left:Warm] Capillary Refill: [Left:< 3 seconds] Electronic Signature(s) Signed: 12/01/2016 4:46:33 PM By: Montey Hora Entered By: Montey Hora on 12/01/2016 10:23:33 [name] ([id number]) -------------------------------------------------------------------------------- Multi Wound Chart Details Patient Name: Jillian Albe B. Date of Service: 12/01/2016 10:00 AM Medical Record Number: [id number] Patient Account Number: [id number] Date of Birth/Sex: [birth year] (71 y.o. Female) Treating RN: Montey Hora Primary Care Nelia Rogoff: Lelon Huh Other Clinician: Referring Dorathea Faerber:  Lelon Huh Treating Kayton Dunaj/Extender: Frann Rider in Treatment: 8 Vital Signs Height(in): 76 Pulse(bpm): 27 Weight(lbs): 22 Blood Pressure(mmHg): 134/64 Body Mass Index(BMI): 36 Temperature(F): 98.2 Respiratory Rate 18 (breaths/min): Photos: [1:No Photos] [2:No Photos] [3:No Photos] Wound Location: [1:Left Lower Leg - Lateral] [2:Left Lower Leg - Anterior] [3:Left Lower Leg - Medial] Wounding Event: [1:Gradually Appeared] [2:Gradually Appeared] [3:Gradually Appeared] Primary Etiology: [1: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] [2:Lower Extremity] [3:Lower Extremity] Comorbid History: [1:Asthma, Congestive Heart Failure, Coronary Artery Disease, Hypertension, Type II Disease, Hypertension, Type II Disease, Hypertension, Type II Diabetes, Osteoarthritis] [2:Asthma, Congestive Heart Failure, Coronary Artery Diabetes,  Osteoarthritis] [3:Asthma,  Congestive Heart Failure, Coronary Artery Diabetes, Osteoarthritis] Date Acquired: [1:08/02/2016] [2:08/02/2016] [3:08/02/2016] Weeks of Treatment: [1:8] [2:8] [3:8] Wound Status: [1:Open] [2:Open] [3:Open] Measurements L x W x D [1:7.6x4.6x0.3] [2:3x2x0.1] [3:3x2.5x0.1] (cm) Area (cm) : [1:27.458] [2:4.712] [3:5.89] Volume (cm) : [1:8.237] [2:0.471] [3:0.589] % Reduction in Area: [1:-58.90%] [2:40.00%] [3:-7.10%] % Reduction in Volume: [1:-376.70%] [2:40.00%] [3:-7.10%] Classification: [1:Grade 1] [2:Grade 1] [3:Grade 1] Exudate Amount: [1:Large] [2:Large] [3:Large] Exudate Type: [1:Serosanguineous] [2:Serous] [3:Serous] Exudate Color: [1:red, brown] [2:amber] [3:amber] Wound Margin: [1:Distinct, outline attached] [2:Distinct, outline attached] [3:Distinct, outline attached] Granulation Amount: [1:Small (1-33%)] [2:Small (1-33%)] [3:None Present (0%)] Granulation Quality: [1:Red] [2:Pink] [3:N/A] Necrotic Amount: [1:Large (67-100%)] [2:Large (67-100%)] [3:Large  (67-100%)] Necrotic Tissue: [1:Adherent Slough] [2:Eschar, Adherent Slough] [3:Eschar, Adherent Slough] Epithelialization: [1:None] [2:None] [3:None] Debridement: [1:Debridement ([id number])] [2:Debridement ([id number])] [3:Debridement ([id number])] Pre-procedure [1:10:27] [2:10:25] [3:10:23] Verification/Time Out Taken: Pain Control: [1:Lidocaine 4% Topical Solution] [2:Lidocaine 4% Topical Solution] [3:Lidocaine 4% Topical Solution] Tissue Debrided: [1:Fibrin/Slough, Subcutaneous] [2:Fibrin/Slough, Subcutaneous] [3:Fibrin/Slough, Subcutaneous] Level: [1:Skin/Subcutaneous Tissue] [2:Skin/Subcutaneous Tissue] [3:Skin/Subcutaneous Tissue] Debridement Area (sq cm): [1:34.96] [2:6] [3:7.5] Instrument: [1:Curette] [2:Curette] [3:Curette] Bleeding: [1:Minimum] [2:Minimum] [3:Minimum] Hemostasis Achieved: Pressure Pressure Pressure Procedural Pain: 0 0 0 Post Procedural Pain: 0 0 0 Debridement Treatment Procedure was tolerated well Procedure was tolerated well Procedure was tolerated well Response: Post Debridement 7.6x4.6x0.4 3x2x0.2 3x2.5x0.2 Measurements L x W x D (cm) Post Debridement Volume: 10.983 0.942 1.178 (cm) Periwound Skin Texture: No Abnormalities Noted No Abnormalities Noted No Abnormalities Noted Periwound Skin Moisture: Maceration: Yes Maceration: Yes Maceration: Yes Periwound Skin Color: Erythema: Yes Erythema: Yes Erythema: Yes Erythema Location: Circumferential Circumferential Circumferential Temperature: No Abnormality No Abnormality No Abnormality Tenderness on Palpation: No Yes Yes Wound Preparation: Ulcer Cleansing: Ulcer Cleansing: Ulcer Cleansing: Rinsed/Irrigated with Saline, Rinsed/Irrigated with Saline, Rinsed/Irrigated with Saline, Other: soap and water Other: soap and water Other: soap and water Topical Anesthetic Applied: Topical Anesthetic Applied: Topical Anesthetic Applied: Other: lidocaine 4% Other: lidocaine 4% Other: lidocaine  4% Procedures Performed: Debridement Debridement Debridement Treatment Notes Electronic Signature(s) Signed: 12/01/2016 10:38:43 AM By: Christin Fudge MD, FACS Entered By: Christin Fudge on 12/01/2016 10:38:43 [name] ([id number]) -------------------------------------------------------------------------------- [place] Details Patient Name: NYEISHA, GOODALL B. Date of Service: 12/01/2016 10:00 AM Medical Record Number: [id number] Patient Account Number: [id number] Date of Birth/Sex: 1945/07/09 (71 y.o. Female) Treating RN: Montey Hora Primary Care Zai Chmiel: Lelon Huh Other Clinician: Referring Eleanna Theilen: Lelon Huh Treating Jasmarie Coppock/Extender: Frann Rider in Treatment: 8 Active Inactive ` Abuse / Safety / Falls / Self Care Management Nursing Diagnoses: History of Falls Potential for falls Goals: Patient will not experience any injury related to falls Date Initiated: 10/02/2016 Target Resolution Date: 01/06/2017 Goal Status: Active Interventions: Assess: immobility, friction, shearing, incontinence upon admission and as needed Assess impairment of mobility on admission and as needed per policy Assess personal safety and home safety (as indicated) on admission and as needed Notes: ` Nutrition Nursing Diagnoses: Imbalanced nutrition Impaired glucose control: actual or potential Potential for alteratiion in Nutrition/Potential for imbalanced nutrition Goals: Patient/caregiver agrees to and verbalizes understanding of need to use nutritional supplements and/or vitamins as prescribed Date Initiated: 10/02/2016 Target Resolution Date: 02/10/2017 Goal Status: Active Patient/caregiver will maintain therapeutic glucose control Date Initiated: 10/02/2016 Target Resolution Date: 01/06/2017 Goal Status: Active Interventions: Assess patient nutrition upon admission and as needed per policy Provide education on elevated blood sugars and impact on wound  healing Provide education on nutrition Treatment Activities: Education provided on Nutrition : 10/02/2016 BRIDGET,  ([id number]) Notes: ` Orientation to the  Wound Care Program Nursing Diagnoses: Knowledge deficit related to the wound healing center program Goals: Patient/caregiver will verbalize understanding of the [place] Program Date Initiated: 10/02/2016 Target Resolution Date: 11/11/2016 Goal Status: Active Interventions: Provide education on orientation to the wound center Notes: ` Pain, Acute or Chronic Nursing Diagnoses: Pain, acute or chronic: actual or potential Potential alteration in comfort, pain Goals: Patient/caregiver will verbalize adequate pain control between visits Date Initiated: 10/02/2016 Target Resolution Date: 02/10/2017 Goal Status: Active Interventions: Complete pain assessment as per visit requirements Notes: ` Wound/Skin Impairment Nursing Diagnoses: Impaired tissue integrity Knowledge deficit related to ulceration/compromised skin integrity Goals: Ulcer/skin breakdown will have a volume reduction of 80% by week 12 Date Initiated: 10/02/2016 Target Resolution Date: 02/03/2017 Goal Status: Active Interventions: Assess patient/caregiver ability to perform ulcer/skin care regimen upon admission and as needed Assess ulceration(s) every visit Notes: SIHAAM, CHROBAK ([id number]) Electronic Signature(s) Signed: 12/01/2016 4:46:33 PM By: Montey Hora Entered By: Montey Hora on 12/01/2016 10:23:40 [name] ([id number]) -------------------------------------------------------------------------------- Pain Assessment Details Patient Name: Jillian Albe B. Date of Service: 12/01/2016 10:00 AM Medical Record Number: [id number] Patient Account Number: [id number] Date of Birth/Sex: 01-16-1945 (71 y.o. Female) Treating RN: Montey Hora Primary Care Eiliyah Reh: Lelon Huh Other Clinician: Referring Lauri Till: Lelon Huh Treating Elayna Tobler/Extender: Frann Rider in Treatment: 8 Active Problems Location of Pain Severity and Description of Pain Patient Has Paino Yes Site Locations Pain Location: Pain in Ulcers With Dressing Change: Yes Duration of the Pain. Constant / Intermittento Constant Pain Management and Medication Current Pain Management: Electronic Signature(s) Signed: 12/01/2016 4:46:33 PM By: Montey Hora Entered By: Montey Hora on 12/01/2016 09:58:12 [name] ([id number]) -------------------------------------------------------------------------------- Patient/Caregiver Education Details Patient Name: [name] Date of Service: 12/01/2016 10:00 AM Medical Record Number: [id number] Patient Account Number: [id number] Date of Birth/Gender: October 10, 1945 (71 y.o. Female) Treating RN: Montey Hora Primary Care Physician: Lelon Huh Other Clinician: Referring Physician: Lelon Huh Treating Physician/Extender: Frann Rider in Treatment: 8 Education Assessment Education Provided To: Patient and Caregiver Education Topics Provided Venous: Handouts: Other: leg elevation Methods: Explain/Verbal Responses: State content correctly Electronic Signature(s) Signed: 12/01/2016 4:46:33 PM By: Montey Hora Entered By: Montey Hora on 12/01/2016 12:01:50 [name] ([id number]) -------------------------------------------------------------------------------- Wound Assessment Details Patient Name: TRENNA, KIELY B. Date of Service: 12/01/2016 10:00 AM Medical Record Number: [id number] Patient Account Number: [id number] Date of Birth/Sex: 1945/09/24 (71 y.o. Female) Treating RN: Montey Hora Primary Care Gerard Cantara: Lelon Huh Other Clinician: Referring Shenouda Genova: Lelon Huh Treating Cici Rodriges/Extender: Frann Rider in Treatment: 8 Wound Status Wound Number: 1 Primary Diabetic Wound/Ulcer of the Lower Extremity Etiology: Wound  Location: Left Lower Leg - Lateral Wound Open Wounding Event: Gradually Appeared Status: Date Acquired: 08/02/2016 Comorbid Asthma, Congestive Heart Failure, Coronary Weeks Of Treatment: 8 History: Artery Disease, Hypertension, Type II Diabetes, Clustered Wound: No Osteoarthritis Wound Measurements Length: (cm) 7.6 Width: (cm) 4.6 Depth: (cm) 0.3 Area: (cm) 27.458 Volume: (cm) 8.237 % Reduction in Area: -58.9% % Reduction in Volume: -376.7% Epithelialization: None Tunneling: No Undermining: No Wound Description Classification: Grade 1 Wound Margin: Distinct, outline attached Exudate Amount: Large Exudate Type: Serosanguineous Exudate Color: red, brown Foul Odor After Cleansing: No Slough/Fibrino Yes Wound Bed Granulation Amount: Small (1-33%) Granulation Quality: Red Necrotic Amount: Large (67-100%) Necrotic Quality: Adherent Slough Periwound Skin Texture Texture Color No Abnormalities Noted: No No Abnormalities Noted: No Erythema: Yes Moisture Erythema Location: Circumferential No Abnormalities Noted: No Maceration: Yes Temperature / Pain Temperature: No Abnormality Wound Preparation Ulcer Cleansing: Rinsed/Irrigated  with Saline, Other: soap and water, Topical Anesthetic Applied: Other: lidocaine 4%, Treatment Notes Wound #1 (Left, Lateral Lower Leg) 1. Cleansed with: Clean wound with Normal Saline Goetsch, Meya B. ([id number]) Cleanse wound with antibacterial soap and water 2. Anesthetic Topical Lidocaine 4% cream to wound bed prior to debridement 3. Peri-wound Care: Barrier cream 4. Dressing Applied: Santyl Ointment 5. Secondary Dressing Applied ABD Pad Dry Gauze Notes xtrasorb, kerlix, coban, unna to anchor, carboflex Electronic Signature(s) Signed: 12/01/2016 4:46:33 PM By: Montey Hora Entered By: Montey Hora on 12/01/2016 10:09:02 [name]  ([id number]) -------------------------------------------------------------------------------- Wound Assessment Details Patient Name: MAKYNNA, MANOCCHIO B. Date of Service: 12/01/2016 10:00 AM Medical Record Number: [id number] Patient Account Number: [id number] Date of Birth/Sex: [birth year] (71 y.o. Female) Treating RN: Montey Hora Primary Care Jony Ladnier: Lelon Huh Other Clinician: Referring Brianny Soulliere: Lelon Huh Treating Alfonso Shackett/Extender: Frann Rider in Treatment: 8 Wound Status Wound Number: 2 Primary Diabetic Wound/Ulcer of the Lower Extremity Etiology: Wound Location: Left Lower Leg - Anterior Wound Open Wounding Event: Gradually Appeared Status: Date Acquired: 08/02/2016 Comorbid Asthma, Congestive Heart Failure, Coronary Weeks Of Treatment: 8 History: Artery Disease, Hypertension, Type II Diabetes, Clustered Wound: No Osteoarthritis Wound Measurements Length: (cm) 3 Width: (cm) 2 Depth: (cm) 0.1 Area: (cm) 4.712 Volume: (cm) 0.471 % Reduction in Area: 40% % Reduction in Volume: 40% Epithelialization: None Tunneling: No Undermining: No Wound Description Classification: Grade 1 Wound Margin: Distinct, outline attached Exudate Amount: Large Exudate Type: Serous Exudate Color: amber Foul Odor After Cleansing: No Slough/Fibrino Yes Wound Bed Granulation Amount: Small (1-33%) Granulation Quality: Pink Necrotic Amount: Large (67-100%) Necrotic Quality: Eschar, Adherent Slough Periwound Skin Texture Texture Color No Abnormalities Noted: No No Abnormalities Noted: No Erythema: Yes Moisture Erythema Location: Circumferential No Abnormalities Noted: No Maceration: Yes Temperature / Pain Temperature: No Abnormality Tenderness on Palpation: Yes Wound Preparation Ulcer Cleansing: Rinsed/Irrigated with Saline, Other: soap and water, Topical Anesthetic Applied: Other: lidocaine 4%, Treatment Notes Wound #2 (Left, Anterior Lower Leg) 1. Cleansed  with: TAMMEY, DEEG ([id number]) Clean wound with Normal Saline Cleanse wound with antibacterial soap and water 2. Anesthetic Topical Lidocaine 4% cream to wound bed prior to debridement 3. Peri-wound Care: Barrier cream 4. Dressing Applied: Santyl Ointment 5. Secondary Dressing Applied ABD Pad Dry Gauze Notes xtrasorb, kerlix, coban, unna to anchor, carboflex Electronic Signature(s) Signed: 12/01/2016 4:46:33 PM By: Montey Hora Entered By: Montey Hora on 12/01/2016 10:09:11 [name] ([id number]) -------------------------------------------------------------------------------- Wound Assessment Details Patient Name: JAX, ABDELRAHMAN B. Date of Service: 12/01/2016 10:00 AM Medical Record Number: [id number] Patient Account Number: [id number] Date of Birth/Sex: 02/18/1945 (71 y.o. Female) Treating RN: Montey Hora Primary Care Kentarius Partington: Lelon Huh Other Clinician: Referring Chontel Warning: Lelon Huh Treating Tamika Nou/Extender: Frann Rider in Treatment: 8 Wound Status Wound Number: 3 Primary Diabetic Wound/Ulcer of the Lower Extremity Etiology: Wound Location: Left Lower Leg - Medial Wound Open Wounding Event: Gradually Appeared Status: Date Acquired: 08/02/2016 Comorbid Asthma, Congestive Heart Failure, Coronary Weeks Of Treatment: 8 History: Artery Disease, Hypertension, Type II Diabetes, Clustered Wound: No Osteoarthritis Wound Measurements Length: (cm) 3 Width: (cm) 2.5 Depth: (cm) 0.1 Area: (cm) 5.89 Volume: (cm) 0.589 % Reduction in Area: -7.1% % Reduction in Volume: -7.1% Epithelialization: None Tunneling: No Undermining: No Wound Description Classification: Grade 1 Wound Margin: Distinct, outline attached Exudate Amount: Large Exudate Type: Serous Exudate Color: amber Foul Odor After Cleansing: No Slough/Fibrino Yes Wound Bed Granulation Amount: None Present (0%) Necrotic Amount: Large (67-100%) Necrotic Quality: Eschar,  Adherent Slough Periwound Skin Texture  Texture Color No Abnormalities Noted: No No Abnormalities Noted: No Erythema: Yes Moisture Erythema Location: Circumferential No Abnormalities Noted: No Maceration: Yes Temperature / Pain Temperature: No Abnormality Tenderness on Palpation: Yes Wound Preparation Ulcer Cleansing: Rinsed/Irrigated with Saline, Other: soap and water, Topical Anesthetic Applied: Other: lidocaine 4%, Treatment Notes Wound #3 (Left, Medial Lower Leg) 1. Cleansed with: Clean wound with Normal Saline Sigmund, Felipe B. ([id number]) Cleanse wound with antibacterial soap and water 2. Anesthetic Topical Lidocaine 4% cream to wound bed prior to debridement 3. Peri-wound Care: Barrier cream 4. Dressing Applied: Santyl Ointment 5. Secondary Dressing Applied ABD Pad Dry Gauze Notes xtrasorb, kerlix, coban, unna to anchor, carboflex Electronic Signature(s) Signed: 12/01/2016 4:46:33 PM By: Montey Hora Entered By: Montey Hora on 12/01/2016 10:09:26 [name] ([id number]) -------------------------------------------------------------------------------- [place] Details Patient Name: Jillian Albe B. Date of Service: 12/01/2016 10:00 AM Medical Record Number: [id number] Patient Account Number: [id number] Date of Birth/Sex: [birth year] (71 y.o. Female) Treating RN: Montey Hora Primary Care Dishon Kehoe: Lelon Huh Other Clinician: Referring Derriana Oser: Lelon Huh Treating Emric Kowalewski/Extender: Frann Rider in Treatment: 8 Vital Signs Time Taken: 09:58 Temperature (F): 98.2 Height (in): 64 Pulse (bpm): 61 Weight (lbs): 209 Respiratory Rate (breaths/min): 18 Body Mass Index (BMI): 35.9 Blood Pressure (mmHg): 134/64 Reference Range: 80 - 120 mg / dl Electronic Signature(s) Signed: 12/01/2016 4:46:33 PM By: Montey Hora Entered By: Montey Hora on 12/01/2016 09:58:52

## 2016-12-03 NOTE — Progress Notes (Signed)
Jillian, Watson ([id number]) Visit Report for 12/01/2016 Chief Complaint Document Details Patient Name: [name], [name]. Date of Service: 12/01/2016 10:00 AM Medical Record Number: [id number] Patient Account Number: [id number] Date of Birth/Sex: December 16, 1945 (71 y.o. Female) Treating RN: Primary Care Provider: Lelon Huh Other Clinician: Referring Provider: Lelon Huh Treating Provider/Extender: Frann Rider in Treatment: 8 Information Obtained from: Patient Chief Complaint Patient seen for complaints of Non-Healing Wounds to both lower extremities which she's had for about 2 months with swelling. Electronic Signature(s) Signed: 12/01/2016 10:40:32 AM By: Christin Fudge MD, FACS Entered By: Christin Fudge on 12/01/2016 10:40:32 [name] ([id number]) -------------------------------------------------------------------------------- Debridement Details Patient Name: Jillian, [name] Date of Service: 12/01/2016 10:00 AM Medical Record Number: [id number] Patient Account Number: [id number] Date of Birth/Sex: December 05, 1945 (71 y.o. Female) Treating RN: Primary Care Provider: Lelon Huh Other Clinician: Referring Provider: Lelon Huh Treating Provider/Extender: Frann Rider in Treatment: 8 Debridement Performed for Wound #1 Left,Lateral Lower Leg Assessment: Performed By: Physician Christin Fudge, MD Debridement: Debridement Severity of Tissue Pre Fat layer exposed Debridement: Pre-procedure Verification/Time Yes - 10:27 Out Taken: Start Time: 10:27 Pain Control: Lidocaine 4% Topical Solution Level: Skin/Subcutaneous Tissue Total Area Debrided (L x W): 7.6 (cm) x 4.6 (cm) = 34.96 (cm) Tissue and other material Viable, Non-Viable, Fibrin/Slough, Subcutaneous debrided: Instrument: Curette Bleeding: Minimum Hemostasis Achieved: Pressure End Time: 10:29 Procedural Pain: 0 Post Procedural Pain: 0 Response to Treatment: Procedure was tolerated well Post  Debridement Measurements of Total Wound Length: (cm) 7.6 Width: (cm) 4.6 Depth: (cm) 0.4 Volume: (cm) 10.983 Character of Wound/Ulcer Post Debridement: Improved Severity of Tissue Post Debridement: Fat layer exposed Post Procedure Diagnosis Same as Pre-procedure Electronic Signature(s) Signed: 12/01/2016 10:38:52 AM By: Christin Fudge MD, FACS Entered By: Christin Fudge on 12/01/2016 10:38:51 [name] ([id number]) -------------------------------------------------------------------------------- Debridement Details Patient Name: Jillian Albe B. Date of Service: 12/01/2016 10:00 AM Medical Record Number: [id number] Patient Account Number: [id number] Date of Birth/Sex: [birth year] (71 y.o. Female) Treating RN: Primary Care Provider: Lelon Huh Other Clinician: Referring Provider: Lelon Huh Treating Provider/Extender: Frann Rider in Treatment: 8 Debridement Performed for Wound #2 Left,Anterior Lower Leg Assessment: Performed By: Physician Christin Fudge, MD Debridement: Debridement Severity of Tissue Pre Fat layer exposed Debridement: Pre-procedure Verification/Time Yes - 10:25 Out Taken: Start Time: 10:25 Pain Control: Lidocaine 4% Topical Solution Level: Skin/Subcutaneous Tissue Total Area Debrided (L x W): 3 (cm) x 2 (cm) = 6 (cm) Tissue and other material Viable, Non-Viable, Fibrin/Slough, Subcutaneous debrided: Instrument: Curette Bleeding: Minimum Hemostasis Achieved: Pressure End Time: 10:27 Procedural Pain: 0 Post Procedural Pain: 0 Response to Treatment: Procedure was tolerated well Post Debridement Measurements of Total Wound Length: (cm) 3 Width: (cm) 2 Depth: (cm) 0.2 Volume: (cm) 0.942 Character of Wound/Ulcer Post Debridement: Improved Severity of Tissue Post Debridement: Fat layer exposed Post Procedure Diagnosis Same as Pre-procedure Electronic Signature(s) Signed: 12/01/2016 10:39:01 AM By: Christin Fudge MD, FACS Entered  By: Christin Fudge on 12/01/2016 10:39:01 [name] ([id number]) -------------------------------------------------------------------------------- Debridement Details Patient Name: Jillian Albe B. Date of Service: 12/01/2016 10:00 AM Medical Record Number: [id number] Patient Account Number: [id number] Date of Birth/Sex: 10/07/1945 (71 y.o. Female) Treating RN: Primary Care Provider: Lelon Huh Other Clinician: Referring Provider: Lelon Huh Treating Provider/Extender: Frann Rider in Treatment: 8 Debridement Performed for Wound #3 Left,Medial Lower Leg Assessment: Performed By: Physician Christin Fudge, MD Debridement: Debridement Severity of Tissue Pre Fat layer exposed Debridement: Pre-procedure Verification/Time Yes - 10:23 Out Taken: Start Time: 10:23 Pain Control: Lidocaine 4%  Topical Solution Level: Skin/Subcutaneous Tissue Total Area Debrided (L x W): 3 (cm) x 2.5 (cm) = 7.5 (cm) Tissue and other material Viable, Non-Viable, Fibrin/Slough, Subcutaneous debrided: Instrument: Curette Bleeding: Minimum Hemostasis Achieved: Pressure End Time: 10:25 Procedural Pain: 0 Post Procedural Pain: 0 Response to Treatment: Procedure was tolerated well Post Debridement Measurements of Total Wound Length: (cm) 3 Width: (cm) 2.5 Depth: (cm) 0.2 Volume: (cm) 1.178 Character of Wound/Ulcer Post Debridement: Improved Severity of Tissue Post Debridement: Fat layer exposed Post Procedure Diagnosis Same as Pre-procedure Electronic Signature(s) Signed: 12/01/2016 10:39:19 AM By: Christin Fudge MD, FACS Entered By: Christin Fudge on 12/01/2016 10:39:18 [name] ([id number]) -------------------------------------------------------------------------------- HPI Details Patient Name: Jillian Albe B. Date of Service: 12/01/2016 10:00 AM Medical Record Number: [id number] Patient Account Number: [id number] Date of Birth/Sex: 01/30/1945 (71 y.o. Female) Treating  RN: Primary Care Provider: Lelon Huh Other Clinician: Referring Provider: Lelon Huh Treating Provider/Extender: Frann Rider in Treatment: 8 History of Present Illness Location: both lower extremity swelling with ulceration Quality: Patient reports experiencing a sharp pain to affected area(s). Severity: Patient states wound are getting worse. Duration: Patient has had the wound for > 2 months prior to seeking treatment at the wound center Timing: Pain in wound is constant (hurts all the time) and is worse when she elevates her legs Context: The wound would happen gradually Modifying Factors: Other treatment(s) tried include:she has been on ciprofloxacin and has been applying Silvadene ointment Associated Signs and Symptoms: Patient reports having increase swelling. HPI Description: 71 year old patient who sees her PCP Dr. Lelon Huh was recently evaluated 10 days ago for diabetes mellitus, hypertension, CHF and hyperlipidemia. she also was noted to have ulcerations develop in her legs and she has been applying Silvadene dressings locally. In the past she has refused wound care referrals.her cardiologist Dr. Saunders Revel saw her and put her on 40 mg of furosemide daily. last hemoglobin A1c was 7.7%. she was also placed on ciprofloxacin twice daily for 7 days and a urine culture was recommended. past medical history significant for coronary artery disease, diabetes mellitus, nonischemic cardiomyopathy and pulmonary hypertension. She is also status post heart catheterization and coronary angiography, tubal ligation and breast cyst removal in the past. She has never been a smoker. Patient had arterial studies done which showed bilateral ABIs are artificially elevated due to noncompressible and calcified vessels. Triphasic waveform throughout. Right great toe TBI is elevated while the left is normal. 10/23/2016 -- the patient is rather moribund from several issues including chronic  back pain and knee pain and swelling of her legs. The large necrotic area on her left lateral anterior calf was bleeding on touch after washing her leg. There was a spot which needed silver nitrate cauterization and this was done appropriately. 11/06/2016 -- she is again noted to have friable bleeding from the left lower extremity wounds and this again had to be cauterized with silver nitrate to control the bleeding as pressure itself would not do it. 11/20/2016 -- the right lower extremity is completely healed and we have ordered 30-40 mm compression stocking's in both the dural layer and also a pair of juxta lites. Electronic Signature(s) Signed: 12/01/2016 10:40:37 AM By: Christin Fudge MD, FACS Entered By: Christin Fudge on 12/01/2016 10:40:37 [name] ([id number]) -------------------------------------------------------------------------------- Physical Exam Details Patient Name: JURI, DINNING B. Date of Service: 12/01/2016 10:00 AM Medical Record Number: [id number] Patient Account Number: [id number] Date of Birth/Sex: 19-Jan-1945 (71 y.o. Female) Treating RN: Primary Care Provider: Lelon Huh Other Clinician:  Referring Provider: Lelon Huh Treating Provider/Extender: Frann Rider in Treatment: 8 Constitutional . Pulse regular. Respirations normal and unlabored. Afebrile. . Eyes Nonicteric. Reactive to light. Ears, Nose, Mouth, and Throat Lips, teeth, and gums WNL.Marland Kitchen Moist mucosa without lesions. Neck supple and nontender. No palpable supraclavicular or cervical adenopathy. Normal sized without goiter. Respiratory WNL. No retractions.. Breath sounds WNL, No rubs, rales, rhonchi, or wheeze.. Cardiovascular Heart rhythm and rate regular, no murmur or gallop.. Pedal Pulses WNL. No clubbing, cyanosis or edema. Chest Breasts symmetical and no nipple discharge.. Breast tissue WNL, no masses, lumps, or tenderness.. Lymphatic No adneopathy. No adenopathy. No  adenopathy. Musculoskeletal Adexa without tenderness or enlargement.. Digits and nails w/o clubbing, cyanosis, infection, petechiae, ischemia, or inflammatory conditions.. Integumentary (Hair, Skin) No suspicious lesions. No crepitus or fluctuance. No peri-wound warmth or erythema. No masses.Marland Kitchen Psychiatric Judgement and insight Intact.. No evidence of depression, anxiety, or agitation.. Notes the lymphedema looks much better but the wounds on the left lower extremity, have a lot of slough and I sharply removed this with a #3 curet and bleeding controlled with pressure. Electronic Signature(s) Signed: 12/01/2016 10:41:17 AM By: Christin Fudge MD, FACS Entered By: Christin Fudge on 12/01/2016 10:41:17 [name] ([id number]) -------------------------------------------------------------------------------- Physician Orders Details Patient Name: NIKIRA, KUSHNIR B. Date of Service: 12/01/2016 10:00 AM Medical Record Number: [id number] Patient Account Number: [id number] Date of Birth/Sex: [birth year] (71 y.o. Female) Treating RN: Montey Hora Primary Care Provider: Lelon Huh Other Clinician: Referring Provider: Lelon Huh Treating Provider/Extender: Frann Rider in Treatment: 8 Verbal / Phone Orders: No Diagnosis Coding Wound Cleansing Wound #1 Left,Lateral Lower Leg o Clean wound with Normal Saline. o Cleanse wound with mild soap and water o May Shower, gently pat wound dry prior to applying new dressing. Wound #2 Left,Anterior Lower Leg o Clean wound with Normal Saline. o Cleanse wound with mild soap and water o May Shower, gently pat wound dry prior to applying new dressing. Wound #3 Left,Medial Lower Leg o Clean wound with Normal Saline. o Cleanse wound with mild soap and water o May Shower, gently pat wound dry prior to applying new dressing. Anesthetic Wound #1 Left,Lateral Lower Leg o Topical Lidocaine 4% cream applied to wound bed prior to  debridement - for clinic use Wound #2 Left,Anterior Lower Leg o Topical Lidocaine 4% cream applied to wound bed prior to debridement - for clinic use Wound #3 Left,Medial Lower Leg o Topical Lidocaine 4% cream applied to wound bed prior to debridement - for clinic use Skin Barriers/Peri-Wound Care Wound #1 Left,Lateral Lower Leg o Barrier cream Wound #2 Left,Anterior Lower Leg o Barrier cream Wound #3 Left,Medial Lower Leg o Barrier cream Primary Wound Dressing Wound #1 Left,Lateral Lower Leg o Santyl Ointment Wound #2 Left,Anterior Lower Leg o Santyl Ointment Wound #3 Left,Medial Lower Leg RIMSHA, TREMBLEY B. ([id number]) o Santyl Ointment Secondary Dressing Wound #1 Left,Lateral Lower Leg o ABD pad o Dry Gauze o XtraSorb o Other - carboflex - HHRN PLEASE ORDER THIS FOR PATIENT Wound #2 Left,Anterior Lower Leg o ABD pad o Dry Gauze o XtraSorb o Other - carboflex - HHRN PLEASE ORDER THIS FOR PATIENT Wound #3 Left,Medial Lower Leg o ABD pad o Dry Gauze o XtraSorb o Other - carboflex - HHRN PLEASE ORDER THIS FOR PATIENT Dressing Change Frequency Wound #1 Left,Lateral Lower Leg o Change Dressing Monday, Wednesday, Friday - HHRN to change Wednesday and Fridays and pt to come to [place] on Mondays. Wound #2 Left,Anterior Lower Leg o  Change Dressing Monday, Wednesday, Friday - HHRN to change Wednesday and Fridays and pt to come to [place] on Mondays. Wound #3 Left,Medial Lower Leg o Change Dressing Monday, Wednesday, Friday - HHRN to change Wednesday and Fridays and pt to come to [place] on Mondays. Follow-up Appointments Wound #1 Left,Lateral Lower Leg o Return Appointment in 1 week. Wound #2 Left,Anterior Lower Leg o Return Appointment in 1 week. Wound #3 Left,Medial Lower Leg o Return Appointment in 1 week. Edema Control Wound #1 Left,Lateral Lower Leg o Kerlix and Coban - Left Lower  Extremity o Patient to wear own compression stockings - HHRN to order and provide patient with dual layer compression garments for both lower legs - 30-49mmHg compression o Patient to wear own Juxtalite/Juzo compression garment. - HHRN to order velcro compression wrap for both lower legs o Elevate legs to the level of the heart and pump ankles as often as possible Wound #2 Left,Anterior Lower Leg o Kerlix and Coban - Left Lower Extremity CHEVI, LIM ([id number]) o Patient to wear own compression stockings - HHRN to order and provide patient with dual layer compression garments for both lower legs - 30-48mmHg compression o Patient to wear own Juxtalite/Juzo compression garment. - HHRN to order velcro compression wrap for both lower legs o Elevate legs to the level of the heart and pump ankles as often as possible Wound #3 Left,Medial Lower Leg o Kerlix and Coban - Left Lower Extremity o Patient to wear own compression stockings - HHRN to order and provide patient with dual layer compression garments for both lower legs - 30-71mmHg compression o Patient to wear own Juxtalite/Juzo compression garment. - HHRN to order velcro compression wrap for both lower legs o Elevate legs to the level of the heart and pump ankles as often as possible Additional Orders / Instructions Wound #1 Left,Lateral Lower Leg o Increase protein intake. Wound #2 Left,Anterior Lower Leg o Increase protein intake. Wound #3 Left,Medial Lower Leg o Increase protein intake. Home Health Wound #1 [place] Nurse may visit PRN to address patientos wound care needs. o FACE TO FACE ENCOUNTER: MEDICARE and MEDICAID PATIENTS: I certify that this patient is under my care and that I had a face-to-face encounter that meets the physician face-to-face encounter requirements with this patient on this date. The encounter with the patient  was in whole or in part for the following MEDICAL CONDITION: (primary reason for [place]) MEDICAL NECESSITY: I certify, that based on my findings, NURSING services are a medically necessary home health service. HOME BOUND STATUS: I certify that my clinical findings support that this patient is homebound (i.e., Due to illness or injury, pt requires aid of supportive devices such as crutches, cane, wheelchairs, walkers, the use of special transportation or the assistance of another person to leave their place of residence. There is a normal inability to leave the home and doing so requires considerable and taxing effort. Other absences are for medical reasons / religious services and are infrequent or of short duration when for other reasons). o If current dressing causes regression in wound condition, may D/C ordered dressing product/s and apply Normal Saline Moist Dressing daily until next [place] / Other MD appointment. [place] of regression in wound condition at [phone]. o Please direct any NON-WOUND related issues/requests for orders to patient's Primary Care Physician Wound #2 [place] Visits   o Home Health Nurse may visit PRN to address patientos wound care needs. o FACE TO FACE ENCOUNTER: MEDICARE and MEDICAID PATIENTS: I certify that this patient is under my care and that I had a face-to-face encounter that meets the physician face-to-face encounter requirements with this patient on this date. The encounter with the patient was in whole or in part for the following MEDICAL CONDITION: (primary reason for [place]) MEDICAL NECESSITY: I certify, that based on my findings, NURSING services are a medically necessary home health service. HOME BOUND STATUS: I certify that my clinical findings support that this patient is homebound (i.e., Due to illness or injury, pt requires aid of supportive  devices such as crutches, cane, wheelchairs, walkers, the use of special transportation or the assistance of another person to leave their place of residence. There is a normal inability to leave the home and doing so requires considerable and taxing effort. Other absences are for medical reasons / religious services and are infrequent or of short duration when for other reasons). REBEKKA, LOBELLO ([id number]) o If current dressing causes regression in wound condition, may D/C ordered dressing product/s and apply Normal Saline Moist Dressing daily until next [place] / Other MD appointment. [place] of regression in wound condition at [phone]. o Please direct any NON-WOUND related issues/requests for orders to patient's Primary Care Physician Wound #3 Left,Medial Lower Leg o [place] Nurse may visit PRN to address patientos wound care needs. o FACE TO FACE ENCOUNTER: MEDICARE and MEDICAID PATIENTS: I certify that this patient is under my care and that I had a face-to-face encounter that meets the physician face-to-face encounter requirements with this patient on this date. The encounter with the patient was in whole or in part for the following MEDICAL CONDITION: (primary reason for [place]) MEDICAL NECESSITY: I certify, that based on my findings, NURSING services are a medically necessary home health service. HOME BOUND STATUS: I certify that my clinical findings support that this patient is homebound (i.e., Due to illness or injury, pt requires aid of supportive devices such as crutches, cane, wheelchairs, walkers, the use of special transportation or the assistance of another person to leave their place of residence. There is a normal inability to leave the home and doing so requires considerable and taxing effort. Other absences are for medical reasons / religious services and are infrequent or of short  duration when for other reasons). o If current dressing causes regression in wound condition, may D/C ordered dressing product/s and apply Normal Saline Moist Dressing daily until next [place] / Other MD appointment. [place] of regression in wound condition at [phone]. o Please direct any NON-WOUND related issues/requests for orders to patient's Primary Care Physician Medications-please add to medication list. Wound #1 Left,Lateral Lower Leg o Santyl Enzymatic Ointment o Other: - Vitamin C, Zinc, Multivitamin Wound #2 Left,Anterior Lower Leg o Other: - Vitamin C, Zinc, Multivitamin Wound #3 Left,Medial Lower Leg o Santyl Enzymatic Ointment o Other: - Vitamin C, Zinc, Multivitamin Electronic Signature(s) Signed: 12/01/2016 4:42:19 PM By: Christin Fudge MD, FACS Signed: 12/01/2016 4:46:33 PM By: Montey Hora Entered By: Montey Hora on 12/01/2016 10:42:09 [name] ([id number]) -------------------------------------------------------------------------------- Problem List Details Patient Name: SHIANN, KAM B. Date of Service: 12/01/2016 10:00 AM Medical Record Number: [id number] Patient Account Number: [id number] Date of Birth/Sex: September 05, 1945 (71 y.o. Female) Treating RN: Primary Care Provider: Lelon Huh Other Clinician: Referring Provider: Caryn Section,  DONALD Treating Provider/Extender: Frann Rider in Treatment: 8 Active Problems ICD-10 Encounter Code Description Active Date Diagnosis E11.622 Type 2 diabetes mellitus with other skin ulcer 10/02/2016 Yes I89.0 Lymphedema, not elsewhere classified 10/02/2016 Yes L97.212 Non-pressure chronic ulcer of right calf with fat layer exposed 10/02/2016 Yes L97.222 Non-pressure chronic ulcer of left calf with fat layer exposed 10/02/2016 Yes Inactive Problems Resolved Problems Electronic Signature(s) Signed: 12/01/2016 10:38:38 AM By: Christin Fudge MD, FACS Entered By: Christin Fudge on 12/01/2016 10:38:38 [name] ([id number]) -------------------------------------------------------------------------------- Progress Note Details Patient Name: Jillian Albe B. Date of Service: 12/01/2016 10:00 AM Medical Record Number: [id number] Patient Account Number: [id number] Date of Birth/Sex: 11-10-1945 (71 y.o. Female) Treating RN: Primary Care Provider: Lelon Huh Other Clinician: Referring Provider: Lelon Huh Treating Provider/Extender: Frann Rider in Treatment: 8 Subjective Chief Complaint Information obtained from Patient Patient seen for complaints of Non-Healing Wounds to both lower extremities which she's had for about 2 months with swelling. History of Present Illness (HPI) The following HPI elements were documented for the patient's wound: Location: both lower extremity swelling with ulceration Quality: Patient reports experiencing a sharp pain to affected area(s). Severity: Patient states wound are getting worse. Duration: Patient has had the wound for > 2 months prior to seeking treatment at the wound center Timing: Pain in wound is constant (hurts all the time) and is worse when she elevates her legs Context: The wound would happen gradually Modifying Factors: Other treatment(s) tried include:she has been on ciprofloxacin and has been applying Silvadene ointment Associated Signs and Symptoms: Patient reports having increase swelling. 71 year old patient who sees her PCP Dr. Lelon Huh was recently evaluated 10 days ago for diabetes mellitus, hypertension, CHF and hyperlipidemia. she also was noted to have ulcerations develop in her legs and she has been applying Silvadene dressings locally. In the past she has refused wound care referrals.her cardiologist Dr. Saunders Revel saw her and put her on 40 mg of furosemide daily. last hemoglobin A1c was 7.7%. she was also placed on ciprofloxacin twice daily for 7 days and a urine culture was  recommended. past medical history significant for coronary artery disease, diabetes mellitus, nonischemic cardiomyopathy and pulmonary hypertension. She is also status post heart catheterization and coronary angiography, tubal ligation and breast cyst removal in the past. She has never been a smoker. Patient had arterial studies done which showed bilateral ABIs are artificially elevated due to noncompressible and calcified vessels. Triphasic waveform throughout. Right great toe TBI is elevated while the left is normal. 10/23/2016 -- the patient is rather moribund from several issues including chronic back pain and knee pain and swelling of her legs. The large necrotic area on her left lateral anterior calf was bleeding on touch after washing her leg. There was a spot which needed silver nitrate cauterization and this was done appropriately. 11/06/2016 -- she is again noted to have friable bleeding from the left lower extremity wounds and this again had to be cauterized with silver nitrate to control the bleeding as pressure itself would not do it. 11/20/2016 -- the right lower extremity is completely healed and we have ordered 30-40 mm compression stocking's in both the dural layer and also a pair of juxta lites. Patient History Information obtained from Patient. Family History Cancer - Mother,Siblings, Diabetes - Mother,Siblings, Heart Disease - Mother, Hypertension - Siblings, No family history of Hereditary Spherocytosis, Kidney Disease, Lung Disease, Seizures, Stroke, Thyroid Problems, Tuberculosis. FARHA, DANO ([id number]) Social History Never smoker, Marital Status - Married,  Alcohol Use - Never, Drug Use - No History, Caffeine Use - Daily. Objective Constitutional Pulse regular. Respirations normal and unlabored. Afebrile. Vitals Time Taken: 9:58 AM, Height: 64 in, Weight: 209 lbs, BMI: 35.9, Temperature: 98.2 F, Pulse: 61 bpm, Respiratory Rate: 18 breaths/min, Blood Pressure:  134/64 mmHg. Eyes Nonicteric. Reactive to light. Ears, Nose, Mouth, and Throat Lips, teeth, and gums WNL.Marland Kitchen Moist mucosa without lesions. Neck supple and nontender. No palpable supraclavicular or cervical adenopathy. Normal sized without goiter. Respiratory WNL. No retractions.. Breath sounds WNL, No rubs, rales, rhonchi, or wheeze.. Cardiovascular Heart rhythm and rate regular, no murmur or gallop.. Pedal Pulses WNL. No clubbing, cyanosis or edema. Chest Breasts symmetical and no nipple discharge.. Breast tissue WNL, no masses, lumps, or tenderness.. Lymphatic No adneopathy. No adenopathy. No adenopathy. Musculoskeletal Adexa without tenderness or enlargement.. Digits and nails w/o clubbing, cyanosis, infection, petechiae, ischemia, or inflammatory conditions.Marland Kitchen Psychiatric Judgement and insight Intact.. No evidence of depression, anxiety, or agitation.. General Notes: the lymphedema looks much better but the wounds on the left lower extremity, have a lot of slough and I sharply removed this with a #3 curet and bleeding controlled with pressure. Integumentary (Hair, Skin) No suspicious lesions. No crepitus or fluctuance. No peri-wound warmth or erythema. No masses.. Wound #1 status is Open. Original cause of wound was Gradually Appeared. The wound is located on the Left,Lateral Lower Leg. The wound measures 7.6cm length x 4.6cm width x 0.3cm depth; 27.458cm^2 area and 8.237cm^3 volume. There is no tunneling or undermining noted. There is a large amount of serosanguineous drainage noted. The wound margin is distinct BREIONA, COUVILLON B. ([id number]) with the outline attached to the wound base. There is small (1-33%) red granulation within the wound bed. There is a large (67-100%) amount of necrotic tissue within the wound bed including Adherent Slough. The periwound skin appearance exhibited: Maceration, Erythema. The surrounding wound skin color is noted with erythema which is  circumferential. Periwound temperature was noted as No Abnormality. Wound #2 status is Open. Original cause of wound was Gradually Appeared. The wound is located on the Left,Anterior Lower Leg. The wound measures 3cm length x 2cm width x 0.1cm depth; 4.712cm^2 area and 0.471cm^3 volume. There is no tunneling or undermining noted. There is a large amount of serous drainage noted. The wound margin is distinct with the outline attached to the wound base. There is small (1-33%) pink granulation within the wound bed. There is a large (67-100%) amount of necrotic tissue within the wound bed including Eschar and Adherent Slough. The periwound skin appearance exhibited: Maceration, Erythema. The surrounding wound skin color is noted with erythema which is circumferential. Periwound temperature was noted as No Abnormality. The periwound has tenderness on palpation. Wound #3 status is Open. Original cause of wound was Gradually Appeared. The wound is located on the Left,Medial Lower Leg. The wound measures 3cm length x 2.5cm width x 0.1cm depth; 5.89cm^2 area and 0.589cm^3 volume. There is no tunneling or undermining noted. There is a large amount of serous drainage noted. The wound margin is distinct with the outline attached to the wound base. There is no granulation within the wound bed. There is a large (67-100%) amount of necrotic tissue within the wound bed including Eschar and Adherent Slough. The periwound skin appearance exhibited: Maceration, Erythema. The surrounding wound skin color is noted with erythema which is circumferential. Periwound temperature was noted as No Abnormality. The periwound has tenderness on palpation. Assessment Active Problems ICD-10 E11.622 - Type 2 diabetes mellitus  with other skin ulcer I89.0 - Lymphedema, not elsewhere classified L97.212 - Non-pressure chronic ulcer of right calf with fat layer exposed L97.222 - Non-pressure chronic ulcer of left calf with fat layer  exposed Procedures Wound #1 Pre-procedure diagnosis of Wound #1 is a Diabetic Wound/Ulcer of the Lower Extremity located on the Left,Lateral Lower Leg .Severity of Tissue Pre Debridement is: Fat layer exposed. There was a Skin/Subcutaneous Tissue Debridement (11042- 11047) debridement with total area of 34.96 sq cm performed by Christin Fudge, MD. with the following instrument(s): Curette to remove Viable and Non-Viable tissue/material including Fibrin/Slough and Subcutaneous after achieving pain control using Lidocaine 4% Topical Solution. A time out was conducted at 10:27, prior to the start of the procedure. A Minimum amount of bleeding was controlled with Pressure. The procedure was tolerated well with a pain level of 0 throughout and a pain level of 0 following the procedure. Post Debridement Measurements: 7.6cm length x 4.6cm width x 0.4cm depth; 10.983cm^3 volume. Character of Wound/Ulcer Post Debridement is improved. Severity of Tissue Post Debridement is: Fat layer exposed. Post procedure Diagnosis Wound #1: Same as Pre-Procedure Wound #2 Pre-procedure diagnosis of Wound #2 is a Diabetic Wound/Ulcer of the Lower Extremity located on the Left,Anterior Lower Leg .Severity of Tissue Pre Debridement is: Fat layer exposed. There was a Skin/Subcutaneous Tissue Debridement (11042- 11047) debridement with total area of 6 sq cm performed by Christin Fudge, MD. with the following instrument(s): Curette to remove Viable and Non-Viable tissue/material including Fibrin/Slough and Subcutaneous after achieving pain control using LITZI, BINNING B. ([id number]) Lidocaine 4% Topical Solution. A time out was conducted at 10:25, prior to the start of the procedure. A Minimum amount of bleeding was controlled with Pressure. The procedure was tolerated well with a pain level of 0 throughout and a pain level of 0 following the procedure. Post Debridement Measurements: 3cm length x 2cm width x 0.2cm depth; 0.942cm^3  volume. Character of Wound/Ulcer Post Debridement is improved. Severity of Tissue Post Debridement is: Fat layer exposed. Post procedure Diagnosis Wound #2: Same as Pre-Procedure Wound #3 Pre-procedure diagnosis of Wound #3 is a Diabetic Wound/Ulcer of the Lower Extremity located on the Left,Medial Lower Leg .Severity of Tissue Pre Debridement is: Fat layer exposed. There was a Skin/Subcutaneous Tissue Debridement (11042- 11047) debridement with total area of 7.5 sq cm performed by Christin Fudge, MD. with the following instrument(s): Curette to remove Viable and Non-Viable tissue/material including Fibrin/Slough and Subcutaneous after achieving pain control using Lidocaine 4% Topical Solution. A time out was conducted at 10:23, prior to the start of the procedure. A Minimum amount of bleeding was controlled with Pressure. The procedure was tolerated well with a pain level of 0 throughout and a pain level of 0 following the procedure. Post Debridement Measurements: 3cm length x 2.5cm width x 0.2cm depth; 1.178cm^3 volume. Character of Wound/Ulcer Post Debridement is improved. Severity of Tissue Post Debridement is: Fat layer exposed. Post procedure Diagnosis Wound #3: Same as Pre-Procedure Plan Wound Cleansing: Wound #1 Left,Lateral Lower Leg: Clean wound with Normal Saline. Cleanse wound with mild soap and water May Shower, gently pat wound dry prior to applying new dressing. Wound #2 Left,Anterior Lower Leg: Clean wound with Normal Saline. Cleanse wound with mild soap and water May Shower, gently pat wound dry prior to applying new dressing. Wound #3 Left,Medial Lower Leg: Clean wound with Normal Saline. Cleanse wound with mild soap and water May Shower, gently pat wound dry prior to applying new dressing. Anesthetic: Wound #1 Left,Lateral  Lower Leg: Topical Lidocaine 4% cream applied to wound bed prior to debridement - for clinic use Wound #2 Left,Anterior Lower Leg: Topical  Lidocaine 4% cream applied to wound bed prior to debridement - for clinic use Wound #3 Left,Medial Lower Leg: Topical Lidocaine 4% cream applied to wound bed prior to debridement - for clinic use Skin Barriers/Peri-Wound Care: Wound #1 Left,Lateral Lower Leg: Barrier cream Wound #2 Left,Anterior Lower Leg: Barrier cream Wound #3 Left,Medial Lower Leg: Barrier cream Primary Wound Dressing: Wound #1 Left,Lateral Lower Leg: Santyl Ointment Wound #2 Left,Anterior Lower Leg: Santyl Ointment Wound #3 Left,Medial Lower Leg: Santyl Ointment RONNETT, PULLIN ([id number]) Secondary Dressing: Wound #1 Left,Lateral Lower Leg: ABD pad Dry Gauze XtraSorb Other - carboflex - HHRN PLEASE ORDER THIS FOR PATIENT Wound #2 Left,Anterior Lower Leg: ABD pad Dry Gauze XtraSorb Other - carboflex - HHRN PLEASE ORDER THIS FOR PATIENT Wound #3 Left,Medial Lower Leg: ABD pad Dry Gauze XtraSorb Other - carboflex - HHRN PLEASE ORDER THIS FOR PATIENT Dressing Change Frequency: Wound #1 Left,Lateral Lower Leg: Change Dressing Monday, Wednesday, Friday - HHRN to change Wednesday and Fridays and pt to come to [facility] on Mondays. Wound #2 Left,Anterior Lower Leg: Change Dressing Monday, Wednesday, Friday - HHRN to change Wednesday and Fridays and pt to come to [place] on Mondays. Wound #3 Left,Medial Lower Leg: Change Dressing Monday, Wednesday, Friday - HHRN to change Wednesday and Fridays and pt to come to [place] on Mondays. Follow-up Appointments: Wound #1 Left,Lateral Lower Leg: Return Appointment in 1 week. Wound #2 Left,Anterior Lower Leg: Return Appointment in 1 week. Wound #3 Left,Medial Lower Leg: Return Appointment in 1 week. Edema Control: Wound #1 Left,Lateral Lower Leg: Kerlix and Coban - Left Lower Extremity Patient to wear own compression stockings - HHRN to order and provide patient with dual layer compression garments for both lower legs - 30-43mmHg  compression Patient to wear own Juxtalite/Juzo compression garment. - HHRN to order velcro compression wrap for both lower legs Elevate legs to the level of the heart and pump ankles as often as possible Wound #2 Left,Anterior Lower Leg: Kerlix and Coban - Left Lower Extremity Patient to wear own compression stockings - HHRN to order and provide patient with dual layer compression garments for both lower legs - 30-38mmHg compression Patient to wear own Juxtalite/Juzo compression garment. - HHRN to order velcro compression wrap for both lower legs Elevate legs to the level of the heart and pump ankles as often as possible Wound #3 Left,Medial Lower Leg: Kerlix and Coban - Left Lower Extremity Patient to wear own compression stockings - HHRN to order and provide patient with dual layer compression garments for both lower legs - 30-60mmHg compression Patient to wear own Juxtalite/Juzo compression garment. - HHRN to order velcro compression wrap for both lower legs Elevate legs to the level of the heart and pump ankles as often as possible Additional Orders / Instructions: Wound #1 Left,Lateral Lower Leg: Increase protein intake. Wound #2 Left,Anterior Lower Leg: Increase protein intake. Wound #3 Left,Medial Lower Leg: Increase protein intake. Home Health: CHRYSTEN, WOULFE ([id number]) Wound #1 Left,Lateral Lower Leg: [place] Nurse may visit PRN to address patient s wound care needs. FACE TO FACE ENCOUNTER: MEDICARE and MEDICAID PATIENTS: I certify that this patient is under my care and that I had a face-to-face encounter that meets the physician face-to-face encounter requirements with this patient on this date. The encounter with the patient was  in whole or in part for the following MEDICAL CONDITION: (primary reason for Home Healthcare) MEDICAL NECESSITY: I certify, that based on my findings, NURSING services are a medically necessary home health service.  HOME BOUND STATUS: I certify that my clinical findings support that this patient is homebound (i.e., Due to illness or injury, pt requires aid of supportive devices such as crutches, cane, wheelchairs, walkers, the use of special transportation or the assistance of another person to leave their place of residence. There is a normal inability to leave the home and doing so requires considerable and taxing effort. Other absences are for medical reasons / religious services and are infrequent or of short duration when for other reasons). If current dressing causes regression in wound condition, may D/C ordered dressing product/s and apply Normal Saline Moist Dressing daily until next [place] / Other MD appointment. [place] of regression in wound condition at [phone]. Please direct any NON-WOUND related issues/requests for orders to patient's Primary Care Physician Wound #2 Left,Anterior Lower Leg: [place] Nurse may visit PRN to address patient s wound care needs. FACE TO FACE ENCOUNTER: MEDICARE and MEDICAID PATIENTS: I certify that this patient is under my care and that I had a face-to-face encounter that meets the physician face-to-face encounter requirements with this patient on this date. The encounter with the patient was in whole or in part for the following MEDICAL CONDITION: (primary reason for [place]) MEDICAL NECESSITY: I certify, that based on my findings, NURSING services are a medically necessary home health service. HOME BOUND STATUS: I certify that my clinical findings support that this patient is homebound (i.e., Due to illness or injury, pt requires aid of supportive devices such as crutches, cane, wheelchairs, walkers, the use of special transportation or the assistance of another person to leave their place of residence. There is a normal inability to leave the home and doing so requires considerable and  taxing effort. Other absences are for medical reasons / religious services and are infrequent or of short duration when for other reasons). If current dressing causes regression in wound condition, may D/C ordered dressing product/s and apply Normal Saline Moist Dressing daily until next [place] / Other MD appointment. [place] of regression in wound condition at [phone]. Please direct any NON-WOUND related issues/requests for orders to patient's Primary Care Physician Wound #3 Left,Medial Lower Leg: [place] Nurse may visit PRN to address patient s wound care needs. FACE TO FACE ENCOUNTER: MEDICARE and MEDICAID PATIENTS: I certify that this patient is under my care and that I had a face-to-face encounter that meets the physician face-to-face encounter requirements with this patient on this date. The encounter with the patient was in whole or in part for the following MEDICAL CONDITION: (primary reason for [place]) MEDICAL NECESSITY: I certify, that based on my findings, NURSING services are a medically necessary home health service. HOME BOUND STATUS: I certify that my clinical findings support that this patient is homebound (i.e., Due to illness or injury, pt requires aid of supportive devices such as crutches, cane, wheelchairs, walkers, the use of special transportation or the assistance of another person to leave their place of residence. There is a normal inability to leave the home and doing so requires considerable and taxing effort. Other absences are for medical reasons / religious services and are infrequent or of short duration when for other reasons). If current  dressing causes regression in wound condition, may D/C ordered dressing product/s and apply Normal Saline Moist Dressing daily until next [place] / Other MD appointment. [place] of regression in wound condition at  [phone]. Please direct any NON-WOUND related issues/requests for orders to patient's Primary Care Physician Medications-please add to medication list.: Wound #1 Left,Lateral Lower Leg: Santyl Enzymatic Ointment Other: - Vitamin C, Zinc, Multivitamin Wound #2 Left,Anterior Lower Leg: Other: - Vitamin C, Zinc, Multivitamin Wound #3 Left,Medial Lower Leg: Santyl Enzymatic Ointment Other: - Vitamin C, Zinc, Multivitamin Laubacher, Anya B. ([id number]) After sharp debridement and review today, I have recommended: 1. Santyl ointment to the left lower extremity to be wrapped with Kerlix and Coban to be changed 3 times a week. 2. we have ordered her compression stockings for the right lower extremity both the juxta light and a dual pair of 30-40 mm compression stockings 3. Elevation and exercise discussed with her in great detail 4. her husband who is the caregiver and is at the bedside, has had several questions which I have answered to his satisfaction. 5. Regular visits to the wound center. Electronic Signature(s) Signed: 12/01/2016 4:40:29 PM By: Christin Fudge MD, FACS Previous Signature: 12/01/2016 10:42:00 AM Version By: Christin Fudge MD, FACS Entered By: Christin Fudge on 12/01/2016 16:40:29 [name] ([id number]) -------------------------------------------------------------------------------- ROS/PFSH Details Patient Name: TNIYA, BOWDITCH B. Date of Service: 12/01/2016 10:00 AM Medical Record Number: [id number] Patient Account Number: [id number] Date of Birth/Sex: Dec 13, 1945 (71 y.o. Female) Treating RN: Primary Care Provider: Lelon Huh Other Clinician: Referring Provider: Lelon Huh Treating Provider/Extender: Frann Rider in Treatment: 8 Information Obtained From Patient Wound History Do you currently have one or more open woundso Yes How many open wounds do you currently haveo 6 Approximately how long have you had your woundso 2 months How have you been  treating your wound(s) until nowo [place] Has your wound(s) ever healed and then re-openedo No Have you had any lab work done in the past montho No Have you tested positive for an antibiotic resistant organism (MRSA, VRE)o No Have you tested positive for osteomyelitis (bone infection)o No Have you had any tests for circulation on your legso Yes Where was the test doneo [facility] Have you had other problems associated with your woundso Infection, Swelling Respiratory Medical History: Positive for: Asthma Cardiovascular Medical History: Positive for: Congestive Heart Failure; Coronary Artery Disease; Hypertension Endocrine Medical History: Positive for: Type II Diabetes Time with diabetes: a long time Treated with: Oral agents Blood sugar tested every day: No Musculoskeletal Medical History: Positive for: Osteoarthritis Immunizations Pneumococcal Vaccine: Received Pneumococcal Vaccination: Yes Implantable Devices Family and Social History Cancer: Yes - Mother,Siblings; Diabetes: Yes - Mother,Siblings; Heart Disease: Yes - Mother; Hereditary Spherocytosis: No; Hypertension: Yes - Siblings; Kidney Disease: No; Lung Disease: No; Seizures: No; Stroke: No; Thyroid Problems: No; JAYLIN, ROUNDY B. ([id number]) Tuberculosis: No; Never smoker; Marital Status - Married; Alcohol Use: Never; Drug Use: No History; Caffeine Use: Daily; Financial Concerns: No; Food, Clothing or Shelter Needs: No; Support System Lacking: No; Transportation Concerns: No; Advanced Directives: No; Patient does not want information on Advanced Directives; Do not resuscitate: No; Living Will: No; Medical Power of Attorney: No Physician Affirmation I have reviewed and agree with the above information. Electronic Signature(s) Signed: 12/01/2016 4:42:19 PM By: Christin Fudge MD, FACS Entered By: Christin Fudge on 12/01/2016 10:40:43 [name]  ([id number]) -------------------------------------------------------------------------------- SuperBill Details Patient Name: SHATAVIA, SANTOR B. Date of Service: 12/01/2016 Medical Record Number: [id number]  Patient Account Number: [id number] Date of Birth/Sex: [birth year] (71 y.o. 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 Facility Procedures CPT4 Code: [id number] Description: [place] - DEB SUBQ TISSUE 20 SQ CM/< ICD-10 Diagnosis Description E11.622 Type 2 diabetes mellitus with other skin ulcer I89.0 Lymphedema, not elsewhere classified L97.212 Non-pressure chronic ulcer of right calf with fat layer exp L97.222  Non-pressure chronic ulcer of left calf with fat layer expo Modifier: osed sed Quantity: 1 CPT4 Code: [id number] Description: 11045 - DEB SUBQ TISS EA ADDL 20CM ICD-10 Diagnosis Description E11.622 Type 2 diabetes mellitus with other skin ulcer I89.0 Lymphedema, not elsewhere classified L97.212 Non-pressure chronic ulcer of right calf with fat layer exp L97.222  Non-pressure chronic ulcer of left calf with fat layer expo Modifier: osed sed Quantity: 2 Physician Procedures CPT4 Code: 9470962 Description: 83662 - WC PHYS SUBQ TISS 20 SQ CM ICD-10 Diagnosis Description E11.622 Type 2 diabetes mellitus with other skin ulcer I89.0 Lymphedema, not elsewhere classified L97.212 Non-pressure chronic ulcer of right calf with fat layer expo L97.222  Non-pressure chronic ulcer of left calf with fat layer expos Modifier: sed ed Quantity: 1 CPT4 Code: [id number] Sleeper, Keiarra  B. Description: 11045 - WC PHYS SUBQ TISS EA ADDL 20 CM ICD-10 Diagnosis Description E11.622 Type 2 diabetes mellitus with other skin ulcer I89.0 Lymphedema, not elsewhere classified L97.212 Non-pressure chronic ulcer of right calf with fat layer expo  L97.222 Non-pressure chronic ulcer of left calf with fat layer expos ([id number]) Modifier: sed ed Quantity: 2 Electronic Signature(s) Signed: 12/01/2016 10:42:19 AM By: Christin Fudge MD, FACS Entered By: Christin Fudge on 12/01/2016 10:42:19

## 2016-12-05 ENCOUNTER — Other Ambulatory Visit: Payer: Self-pay | Admitting: Family Medicine

## 2016-12-06 DIAGNOSIS — I429 Cardiomyopathy, unspecified: Secondary | ICD-10-CM | POA: Diagnosis not present

## 2016-12-06 DIAGNOSIS — I2510 Atherosclerotic heart disease of native coronary artery without angina pectoris: Secondary | ICD-10-CM | POA: Diagnosis not present

## 2016-12-06 DIAGNOSIS — Z4800 Encounter for change or removal of nonsurgical wound dressing: Secondary | ICD-10-CM | POA: Diagnosis not present

## 2016-12-06 DIAGNOSIS — Z79891 Long term (current) use of opiate analgesic: Secondary | ICD-10-CM | POA: Diagnosis not present

## 2016-12-06 DIAGNOSIS — I272 Pulmonary hypertension, unspecified: Secondary | ICD-10-CM | POA: Diagnosis not present

## 2016-12-06 DIAGNOSIS — J45909 Unspecified asthma, uncomplicated: Secondary | ICD-10-CM | POA: Diagnosis not present

## 2016-12-06 DIAGNOSIS — L97222 Non-pressure chronic ulcer of left calf with fat layer exposed: Secondary | ICD-10-CM | POA: Diagnosis not present

## 2016-12-06 DIAGNOSIS — M1990 Unspecified osteoarthritis, unspecified site: Secondary | ICD-10-CM | POA: Diagnosis not present

## 2016-12-06 DIAGNOSIS — E11622 Type 2 diabetes mellitus with other skin ulcer: Secondary | ICD-10-CM | POA: Diagnosis not present

## 2016-12-06 DIAGNOSIS — I509 Heart failure, unspecified: Secondary | ICD-10-CM | POA: Diagnosis not present

## 2016-12-06 DIAGNOSIS — Z7984 Long term (current) use of oral hypoglycemic drugs: Secondary | ICD-10-CM | POA: Diagnosis not present

## 2016-12-06 DIAGNOSIS — I110 Hypertensive heart disease with heart failure: Secondary | ICD-10-CM | POA: Diagnosis not present

## 2016-12-08 DIAGNOSIS — I272 Pulmonary hypertension, unspecified: Secondary | ICD-10-CM | POA: Diagnosis not present

## 2016-12-08 DIAGNOSIS — I110 Hypertensive heart disease with heart failure: Secondary | ICD-10-CM | POA: Diagnosis not present

## 2016-12-08 DIAGNOSIS — I509 Heart failure, unspecified: Secondary | ICD-10-CM | POA: Diagnosis not present

## 2016-12-08 DIAGNOSIS — Z79891 Long term (current) use of opiate analgesic: Secondary | ICD-10-CM | POA: Diagnosis not present

## 2016-12-08 DIAGNOSIS — I2510 Atherosclerotic heart disease of native coronary artery without angina pectoris: Secondary | ICD-10-CM | POA: Diagnosis not present

## 2016-12-08 DIAGNOSIS — L97222 Non-pressure chronic ulcer of left calf with fat layer exposed: Secondary | ICD-10-CM | POA: Diagnosis not present

## 2016-12-08 DIAGNOSIS — E11622 Type 2 diabetes mellitus with other skin ulcer: Secondary | ICD-10-CM | POA: Diagnosis not present

## 2016-12-08 DIAGNOSIS — M1990 Unspecified osteoarthritis, unspecified site: Secondary | ICD-10-CM | POA: Diagnosis not present

## 2016-12-08 DIAGNOSIS — J45909 Unspecified asthma, uncomplicated: Secondary | ICD-10-CM | POA: Diagnosis not present

## 2016-12-08 DIAGNOSIS — I429 Cardiomyopathy, unspecified: Secondary | ICD-10-CM | POA: Diagnosis not present

## 2016-12-08 DIAGNOSIS — Z7984 Long term (current) use of oral hypoglycemic drugs: Secondary | ICD-10-CM | POA: Diagnosis not present

## 2016-12-08 DIAGNOSIS — Z4800 Encounter for change or removal of nonsurgical wound dressing: Secondary | ICD-10-CM | POA: Diagnosis not present

## 2016-12-11 ENCOUNTER — Ambulatory Visit: Payer: Medicare Other | Admitting: Surgery

## 2016-12-12 ENCOUNTER — Ambulatory Visit: Payer: Medicare Other | Admitting: Hematology and Oncology

## 2016-12-12 ENCOUNTER — Other Ambulatory Visit: Payer: Medicare Other

## 2016-12-13 DIAGNOSIS — M1990 Unspecified osteoarthritis, unspecified site: Secondary | ICD-10-CM | POA: Diagnosis not present

## 2016-12-13 DIAGNOSIS — I509 Heart failure, unspecified: Secondary | ICD-10-CM | POA: Diagnosis not present

## 2016-12-13 DIAGNOSIS — Z7984 Long term (current) use of oral hypoglycemic drugs: Secondary | ICD-10-CM | POA: Diagnosis not present

## 2016-12-13 DIAGNOSIS — L97222 Non-pressure chronic ulcer of left calf with fat layer exposed: Secondary | ICD-10-CM | POA: Diagnosis not present

## 2016-12-13 DIAGNOSIS — I272 Pulmonary hypertension, unspecified: Secondary | ICD-10-CM | POA: Diagnosis not present

## 2016-12-13 DIAGNOSIS — Z79891 Long term (current) use of opiate analgesic: Secondary | ICD-10-CM | POA: Diagnosis not present

## 2016-12-13 DIAGNOSIS — I110 Hypertensive heart disease with heart failure: Secondary | ICD-10-CM | POA: Diagnosis not present

## 2016-12-13 DIAGNOSIS — E11622 Type 2 diabetes mellitus with other skin ulcer: Secondary | ICD-10-CM | POA: Diagnosis not present

## 2016-12-13 DIAGNOSIS — I429 Cardiomyopathy, unspecified: Secondary | ICD-10-CM | POA: Diagnosis not present

## 2016-12-13 DIAGNOSIS — J45909 Unspecified asthma, uncomplicated: Secondary | ICD-10-CM | POA: Diagnosis not present

## 2016-12-13 DIAGNOSIS — I2510 Atherosclerotic heart disease of native coronary artery without angina pectoris: Secondary | ICD-10-CM | POA: Diagnosis not present

## 2016-12-13 DIAGNOSIS — Z4800 Encounter for change or removal of nonsurgical wound dressing: Secondary | ICD-10-CM | POA: Diagnosis not present

## 2016-12-15 DIAGNOSIS — Z79891 Long term (current) use of opiate analgesic: Secondary | ICD-10-CM | POA: Diagnosis not present

## 2016-12-15 DIAGNOSIS — Z7984 Long term (current) use of oral hypoglycemic drugs: Secondary | ICD-10-CM | POA: Diagnosis not present

## 2016-12-15 DIAGNOSIS — E11622 Type 2 diabetes mellitus with other skin ulcer: Secondary | ICD-10-CM | POA: Diagnosis not present

## 2016-12-15 DIAGNOSIS — I110 Hypertensive heart disease with heart failure: Secondary | ICD-10-CM | POA: Diagnosis not present

## 2016-12-15 DIAGNOSIS — I429 Cardiomyopathy, unspecified: Secondary | ICD-10-CM | POA: Diagnosis not present

## 2016-12-15 DIAGNOSIS — I272 Pulmonary hypertension, unspecified: Secondary | ICD-10-CM | POA: Diagnosis not present

## 2016-12-15 DIAGNOSIS — M1990 Unspecified osteoarthritis, unspecified site: Secondary | ICD-10-CM | POA: Diagnosis not present

## 2016-12-15 DIAGNOSIS — J45909 Unspecified asthma, uncomplicated: Secondary | ICD-10-CM | POA: Diagnosis not present

## 2016-12-15 DIAGNOSIS — L97222 Non-pressure chronic ulcer of left calf with fat layer exposed: Secondary | ICD-10-CM | POA: Diagnosis not present

## 2016-12-15 DIAGNOSIS — Z4800 Encounter for change or removal of nonsurgical wound dressing: Secondary | ICD-10-CM | POA: Diagnosis not present

## 2016-12-15 DIAGNOSIS — I509 Heart failure, unspecified: Secondary | ICD-10-CM | POA: Diagnosis not present

## 2016-12-15 DIAGNOSIS — I2510 Atherosclerotic heart disease of native coronary artery without angina pectoris: Secondary | ICD-10-CM | POA: Diagnosis not present

## 2016-12-18 ENCOUNTER — Encounter: Payer: Medicare Other | Attending: Surgery | Admitting: Surgery

## 2016-12-18 DIAGNOSIS — L97212 Non-pressure chronic ulcer of right calf with fat layer exposed: Secondary | ICD-10-CM | POA: Diagnosis not present

## 2016-12-18 DIAGNOSIS — M1990 Unspecified osteoarthritis, unspecified site: Secondary | ICD-10-CM | POA: Insufficient documentation

## 2016-12-18 DIAGNOSIS — E785 Hyperlipidemia, unspecified: Secondary | ICD-10-CM | POA: Insufficient documentation

## 2016-12-18 DIAGNOSIS — J45909 Unspecified asthma, uncomplicated: Secondary | ICD-10-CM | POA: Diagnosis not present

## 2016-12-18 DIAGNOSIS — I509 Heart failure, unspecified: Secondary | ICD-10-CM | POA: Insufficient documentation

## 2016-12-18 DIAGNOSIS — I890 Lymphedema, not elsewhere classified: Secondary | ICD-10-CM | POA: Diagnosis not present

## 2016-12-18 DIAGNOSIS — Z7984 Long term (current) use of oral hypoglycemic drugs: Secondary | ICD-10-CM | POA: Diagnosis not present

## 2016-12-18 DIAGNOSIS — E11622 Type 2 diabetes mellitus with other skin ulcer: Secondary | ICD-10-CM | POA: Diagnosis not present

## 2016-12-18 DIAGNOSIS — L97829 Non-pressure chronic ulcer of other part of left lower leg with unspecified severity: Secondary | ICD-10-CM | POA: Diagnosis not present

## 2016-12-18 DIAGNOSIS — I429 Cardiomyopathy, unspecified: Secondary | ICD-10-CM | POA: Diagnosis not present

## 2016-12-18 DIAGNOSIS — I272 Pulmonary hypertension, unspecified: Secondary | ICD-10-CM | POA: Diagnosis not present

## 2016-12-18 DIAGNOSIS — I110 Hypertensive heart disease with heart failure: Secondary | ICD-10-CM | POA: Insufficient documentation

## 2016-12-18 DIAGNOSIS — L97812 Non-pressure chronic ulcer of other part of right lower leg with fat layer exposed: Secondary | ICD-10-CM | POA: Diagnosis not present

## 2016-12-18 DIAGNOSIS — L97222 Non-pressure chronic ulcer of left calf with fat layer exposed: Secondary | ICD-10-CM | POA: Diagnosis not present

## 2016-12-18 DIAGNOSIS — I2510 Atherosclerotic heart disease of native coronary artery without angina pectoris: Secondary | ICD-10-CM | POA: Insufficient documentation

## 2016-12-19 NOTE — Progress Notes (Signed)
[name] ([id number]) Visit Report for 12/18/2016 Chief Complaint Document Details Patient Name: [name], [name]. Date of Service: 12/18/2016 9:45 AM Medical Record Number: [id number] Patient Account Number: [id number] Date of Birth/Sex: 1945-11-29 (71 y.o. Female) Treating RN: Ahmed Prima Primary Care Provider: Lelon Huh Other Clinician: Referring Provider: Lelon Huh Treating Provider/Extender: Frann Rider in Treatment: 11 Information Obtained from: Patient Chief Complaint Patient seen for complaints of Non-Healing Wounds to both lower extremities which she's had for about 2 months with swelling. Electronic Signature(s) Signed: 12/18/2016 10:32:27 AM By: Christin Fudge MD, FACS Entered By: Christin Fudge on 12/18/2016 10:32:26 [name] ([id number]) -------------------------------------------------------------------------------- HPI Details Patient Name: [name], [name] Date of Service: 12/18/2016 9:45 AM Medical Record Number: [id number] Patient Account Number: [id number] Date of Birth/Sex: 07-May-1945 (71 y.o. Female) Treating RN: Ahmed Prima Primary Care Provider: Lelon Huh Other Clinician: Referring Provider: Lelon Huh Treating Provider/Extender: Frann Rider in Treatment: 11 History of Present Illness Location: both lower extremity swelling with ulceration Quality: Patient reports experiencing a sharp pain to affected area(s). Severity: Patient states wound are getting worse. Duration: Patient has had the wound for > 2 months prior to seeking treatment at the wound center Timing: Pain in wound is constant (hurts all the time) and is worse when she elevates her legs Context: The wound would happen gradually Modifying Factors: Other treatment(s) tried include:she has been on ciprofloxacin and has been applying Silvadene ointment Associated Signs and Symptoms: Patient reports having increase swelling. HPI Description:  71 year old patient who sees her PCP Dr. Lelon Huh was recently evaluated 10 days ago for diabetes mellitus, hypertension, CHF and hyperlipidemia. she also was noted to have ulcerations develop in her legs and she has been applying Silvadene dressings locally. In the past she has refused wound care referrals.her cardiologist Dr. Saunders Revel saw her and put her on 40 mg of furosemide daily. last hemoglobin A1c was 7.7%. she was also placed on ciprofloxacin twice daily for 7 days and a urine culture was recommended. past medical history significant for coronary artery disease, diabetes mellitus, nonischemic cardiomyopathy and pulmonary hypertension. She is also status post heart catheterization and coronary angiography, tubal ligation and breast cyst removal in the past. She has never been a smoker. Patient had arterial studies done which showed bilateral ABIs are artificially elevated due to noncompressible and calcified vessels. Triphasic waveform throughout. Right great toe TBI is elevated while the left is normal. 10/23/2016 -- the patient is rather moribund from several issues including chronic back pain and knee pain and swelling of her legs. The large necrotic area on her left lateral anterior calf was bleeding on touch after washing her leg. There was a spot which needed silver nitrate cauterization and this was done appropriately. 11/06/2016 -- she is again noted to have friable bleeding from the left lower extremity wounds and this again had to be cauterized with silver nitrate to control the bleeding as pressure itself would not do it. 11/20/2016 -- the right lower extremity is completely healed and we have ordered 30-40 mm compression stocking's in both the dural layer and also a pair of juxta lites. 12/18/2016 -- she has not been wearing her compression stockings on her right leg and these have opened out into ulcerations again. Her left lower extremity has circumferential ulcerations  now. I believe at this stage I would like to get her venous reflux studies done to make certain that there is no fixable superficial venous reflux Electronic Signature(s) Signed: 12/18/2016 10:33:11 AM  By: Christin Fudge MD, FACS Entered By: Christin Fudge on 12/18/2016 10:33:11 [name] ([id number]) -------------------------------------------------------------------------------- Physical Exam Details Patient Name: [name] B. Date of Service: 12/18/2016 9:45 AM Medical Record Number: [id number] Patient Account Number: [id number] Date of Birth/Sex: 12/20/1945 (71 y.o. Female) Treating RN: Ahmed Prima Primary Care Provider: Lelon Huh Other Clinician: Referring Provider: Lelon Huh Treating Provider/Extender: Frann Rider in Treatment: 11 Constitutional . Pulse regular. Respirations normal and unlabored. Afebrile. . Eyes Nonicteric. Reactive to light. Ears, Nose, Mouth, and Throat Lips, teeth, and gums WNL.Marland Kitchen Moist mucosa without lesions. Neck supple and nontender. No palpable supraclavicular or cervical adenopathy. Normal sized without goiter. Respiratory WNL. No retractions.. Cardiovascular Pedal Pulses WNL. No clubbing, cyanosis or edema. Lymphatic No adneopathy. No adenopathy. No adenopathy. Musculoskeletal Adexa without tenderness or enlargement.. Digits and nails w/o clubbing, cyanosis, infection, petechiae, ischemia, or inflammatory conditions.. Integumentary (Hair, Skin) No suspicious lesions. No crepitus or fluctuance. No peri-wound warmth or erythema. No masses.Marland Kitchen Psychiatric Judgement and insight Intact.. No evidence of depression, anxiety, or agitation.. Notes the lymphedema is increased significantly and there have been 2 open wounds on her right lower extremity which are new. The left lower extremity is also looking worse with circumferential ulceration. There is no cellulitis. Electronic Signature(s) Signed: 12/18/2016 10:33:57 AM By:  Christin Fudge MD, FACS Entered By: Christin Fudge on 12/18/2016 10:33:56 [name] ([id number]) -------------------------------------------------------------------------------- Physician Orders Details Patient Name: MALENE, BLAYDES B. Date of Service: 12/18/2016 9:45 AM Medical Record Number: [id number] Patient Account Number: [id number] Date of Birth/Sex: 01/22/1945 (71 y.o. Female) Treating RN: Ahmed Prima Primary Care Provider: Lelon Huh Other Clinician: Referring Provider: Lelon Huh Treating Provider/Extender: Frann Rider in Treatment: 23 Verbal / Phone Orders: Yes Clinician: Carolyne Fiscal, Debi Read Back and Verified: Yes Diagnosis Coding Wound Cleansing Wound #1 Left,Circumferential Lower Leg o Clean wound with Normal Saline. o Cleanse wound with mild soap and water o May Shower, gently pat wound dry prior to applying new dressing. Wound #8 Right,Proximal Lower Leg o Clean wound with Normal Saline. o Cleanse wound with mild soap and water o May Shower, gently pat wound dry prior to applying new dressing. Wound #9 Right,Distal Lower Leg o Clean wound with Normal Saline. o Cleanse wound with mild soap and water o May Shower, gently pat wound dry prior to applying new dressing. Anesthetic (add to Medication List) Wound #1 Left,Circumferential Lower Leg o Topical Lidocaine 4% cream applied to wound bed prior to debridement (In Clinic Only). Wound #8 Right,Proximal Lower Leg o Topical Lidocaine 4% cream applied to wound bed prior to debridement (In Clinic Only). Wound #9 Right,Distal Lower Leg o Topical Lidocaine 4% cream applied to wound bed prior to debridement (In Clinic Only). Skin Barriers/Peri-Wound Care Wound #1 Left,Circumferential Lower Leg o Barrier cream Wound #8 Right,Proximal Lower Leg o Barrier cream Wound #9 Right,Distal Lower Leg o Barrier cream Primary Wound Dressing Wound #1 Left,Circumferential Lower  Leg o Silvercel Non-Adherent Wound #8 Right,Proximal Lower Leg o [place] Non-Adherent Wound #9 Right,Distal Lower Leg SAWSAN, RIGGIO B. ([id number]) o Silvercel Non-Adherent Secondary Dressing Wound #1 Left,Circumferential Lower Leg o ABD pad o Dry Gauze o XtraSorb o Other - carboflex - HHRN PLEASE ORDER THIS FOR PATIENT Wound #8 Right,Proximal Lower Leg o ABD pad o Dry Gauze o XtraSorb o Other - carboflex - HHRN PLEASE ORDER THIS FOR PATIENT Wound #9 Right,Distal Lower Leg o ABD pad o Dry Gauze o XtraSorb o Other - carboflex - HHRN PLEASE ORDER THIS FOR PATIENT Dressing  Change Frequency Wound #1 Left,Circumferential Lower Leg o Change Dressing Monday, Wednesday, Friday - HHRN to change Wednesday and Fridays and pt to come to [place] on Mondays. Wound #8 Right,Proximal Lower Leg o Change Dressing Monday, Wednesday, Friday - HHRN to change Wednesday and Fridays and pt to come to [place] on Mondays. Wound #9 Right,Distal Lower Leg o Change Dressing Monday, Wednesday, Friday - HHRN to change Wednesday and Fridays and pt to come to [place] on Mondays. Follow-up Appointments o Return Appointment in 1 week. Edema Control Wound #1 Left,Circumferential Lower Leg o 3 Layer Compression System - Bilateral - unna to anchor o Elevate legs to the level of the heart and pump ankles as often as possible Wound #8 Right,Proximal Lower Leg o 3 Layer Compression System - Bilateral - unna to anchor o Elevate legs to the level of the heart and pump ankles as often as possible Wound #9 Right,Distal Lower Leg o 3 Layer Compression System - Bilateral - unna to anchor o Elevate legs to the level of the heart and pump ankles as often as possible Additional Orders / Instructions Wound #1 Left,Circumferential Lower Leg o Vitamin A; Vitamin C, Zinc o Increase protein intake. Wound #8 Right,Proximal Lower Leg CHASTY, RANDAL ([id number]) o Vitamin A; Vitamin C, Zinc o Increase protein intake. Wound #9 Right,Distal Lower Leg o Vitamin A; Vitamin C, Zinc o Increase protein intake. Home Health Wound #1 Left,Circumferential Lower Leg o [place] Nurse may visit PRN to address patientos wound care needs. o FACE TO FACE ENCOUNTER: MEDICARE and MEDICAID PATIENTS: I certify that this patient is under my care and that I had a face-to-face encounter that meets the physician face-to-face encounter requirements with this patient on this date. The encounter with the patient was in whole or in part for the following MEDICAL CONDITION: (primary reason for [place]) MEDICAL NECESSITY: I certify, that based on my findings, NURSING services are a medically necessary home health service. HOME BOUND STATUS: I certify that my clinical findings support that this patient is homebound (i.e., Due to illness or injury, pt requires aid of supportive devices such as crutches, cane, wheelchairs, walkers, the use of special transportation or the assistance of another person to leave their place of residence. There is a normal inability to leave the home and doing so requires considerable and taxing effort. Other absences are for medical reasons / religious services and are infrequent or of short duration when for other reasons). o If current dressing causes regression in wound condition, may D/C ordered dressing product/s and apply Normal Saline Moist Dressing daily until next [place] / Other MD appointment. [place] of regression in wound condition at [phone]. o Please direct any NON-WOUND related issues/requests for orders to patient's Primary Care Physician Wound #8 Right,Proximal Lower Leg o [place] Visits o Home Health Nurse may visit PRN to address patientos wound care needs. o FACE TO FACE ENCOUNTER: MEDICARE and  MEDICAID PATIENTS: I certify that this patient is under my care and that I had a face-to-face encounter that meets the physician face-to-face encounter requirements with this patient on this date. The encounter with the patient was in whole or in part for the following MEDICAL CONDITION: (primary reason for [place]) MEDICAL NECESSITY: I certify, that based on my findings, NURSING services are a medically necessary home health service. HOME BOUND STATUS: I certify that my clinical findings  support that this patient is homebound (i.e., Due to illness or injury, pt requires aid of supportive devices such as crutches, cane, wheelchairs, walkers, the use of special transportation or the assistance of another person to leave their place of residence. There is a normal inability to leave the home and doing so requires considerable and taxing effort. Other absences are for medical reasons / religious services and are infrequent or of short duration when for other reasons). o If current dressing causes regression in wound condition, may D/C ordered dressing product/s and apply Normal Saline Moist Dressing daily until next [place] / Other MD appointment. [place] of regression in wound condition at [phone]. o Please direct any NON-WOUND related issues/requests for orders to patient's Primary Care Physician Wound #9 Right,Distal Lower Leg o [place] Visits o Home Health Nurse may visit PRN to address patientos wound care needs. o FACE TO FACE ENCOUNTER: MEDICARE and MEDICAID PATIENTS: I certify that this patient is under my care and that I had a face-to-face encounter that meets the physician face-to-face encounter requirements with this patient on this date. The encounter with the patient was in whole or in part for the following MEDICAL CONDITION: (primary reason for [place]) MEDICAL NECESSITY: I certify, that based on my  findings, NURSING services are a medically necessary home health service. HOME BOUND STATUS: I certify that my clinical findings support that this patient is homebound (i.e., Due to illness or injury, pt requires aid of supportive devices such as crutches, cane, wheelchairs, walkers, the use of special transportation or the assistance of another person to leave their place of residence. There is a normal inability to leave the home and doing so requires considerable and taxing effort. Other absences are for medical reasons / religious services and are infrequent or of short duration when for other reasons). TAYLR, MEUTH ([id number]) o If current dressing causes regression in wound condition, may D/C ordered dressing product/s and apply Normal Saline Moist Dressing daily until next [place] / Other MD appointment. [place] of regression in wound condition at [phone]. o Please direct any NON-WOUND related issues/requests for orders to patient's Primary Care Physician Services and Therapies o Venous Studies -Bilateral - [facility] Electronic Signature(s) Signed: 12/18/2016 4:44:52 PM By: Christin Fudge MD, FACS Signed: 12/18/2016 5:07:23 PM By: Alric Quan Previous Signature: 12/18/2016 10:50:58 AM Version By: Christin Fudge MD, FACS Entered By: Alric Quan on 12/18/2016 13:50:01 [name] ([id number]) -------------------------------------------------------------------------------- Problem List Details Patient Name: KYNZLEE, HUCKER B. Date of Service: 12/18/2016 9:45 AM Medical Record Number: [id number] Patient Account Number: [id number] Date of Birth/Sex: 09-Jun-1945 (71 y.o. 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 Date of Service: 12/18/2016 9:45 AM Medical Record Number: [id number] Patient Account Number: [id number] Date of Birth/Sex: 07-16-1945 (71 y.o. Female) Treating RN: Ahmed Prima Primary Care Provider: Caryn Section,  DONALD Other Clinician: Referring Provider: Lelon Huh Treating Provider/Extender: Frann Rider in Treatment: 11 Subjective Chief Complaint Information obtained from Patient Patient seen for complaints of Non-Healing Wounds to both lower extremities which she's had for about 2 months with swelling. History of Present Illness (HPI) The following HPI elements were documented for the patient's wound: Location: both lower extremity swelling with ulceration Quality: Patient reports experiencing a sharp pain to affected area(s). Severity: Patient states wound are getting worse. Duration: Patient has had the wound for > 2 months prior to seeking treatment at the wound center Timing: Pain in wound is constant (hurts all the time) and is worse when she elevates her legs Context: The wound would happen gradually Modifying Factors: Other treatment(s) tried include:she has been on ciprofloxacin and has been applying Silvadene ointment Associated Signs and Symptoms: Patient reports having increase swelling. 71 year old patient who sees her PCP Dr.  Lelon Huh was recently evaluated 10 days ago for diabetes mellitus, hypertension, CHF and hyperlipidemia. she also was noted to have ulcerations develop in her legs and she has been applying Silvadene dressings locally. In the past she has refused wound care referrals.her cardiologist Dr. Saunders Revel saw her and put her on 40 mg of furosemide daily. last hemoglobin A1c was 7.7%. she was also placed on ciprofloxacin twice daily for 7 days and a urine culture was recommended. past medical history significant for coronary artery disease, diabetes mellitus, nonischemic cardiomyopathy and pulmonary hypertension. She is also status post heart catheterization and coronary angiography, tubal ligation and breast cyst removal in the past. She has never been a smoker. Patient had arterial studies done which showed bilateral ABIs are artificially elevated due to noncompressible and calcified vessels. Triphasic waveform throughout. Right great toe TBI is elevated while the left is normal. 10/23/2016 -- the patient is rather moribund from several issues including chronic back pain and knee pain and swelling of her legs. The large necrotic area on her left lateral anterior calf was bleeding on touch after washing her leg. There was a spot which needed silver nitrate cauterization and this was done appropriately. 11/06/2016 -- she is again noted to have friable bleeding from the left lower extremity wounds and this again had to be cauterized with silver nitrate to control the bleeding as pressure itself would not do it. 11/20/2016 -- the right lower extremity is completely healed and we have ordered 30-40 mm compression stocking's in both the dural layer and also a pair of juxta lites. 12/18/2016 -- she has not been wearing her compression stockings on her right leg and these have opened out into ulcerations again. Her left lower extremity has circumferential ulcerations now. I believe at this stage I would like to  get her venous reflux studies done to make certain that there is no fixable superficial venous reflux Patient History Information obtained from Patient. Family History JILLIAM, BELLMORE ([id number]) [place], Diabetes - Mother,Siblings, Heart Disease - Mother, Hypertension - Siblings, No family history of Hereditary Spherocytosis, Kidney Disease, Lung Disease, Seizures, Stroke, Thyroid Problems, Tuberculosis. Social History Never smoker, Marital Status - Married, Alcohol Use - Never, Drug Use - No History, Caffeine Use - Daily. Objective Constitutional Pulse regular. Respirations normal and unlabored. Afebrile. Vitals Time Taken: 9:42 AM, Height: 64 in, Weight: 209 lbs, BMI: 35.9, Temperature: 97.7 F, Pulse: 61 bpm, Respiratory Rate: 16 breaths/min, Blood Pressure: 149/57 mmHg. Eyes Nonicteric. Reactive to light. Ears, Nose, Mouth, and Throat Lips, teeth, and gums WNL.Marland Kitchen Moist mucosa without lesions.  Neck supple and nontender. No palpable supraclavicular or cervical adenopathy. Normal sized without goiter. Respiratory WNL. No retractions.. Cardiovascular Pedal Pulses WNL. No clubbing, cyanosis or edema. Lymphatic No adneopathy. No adenopathy. No adenopathy. Musculoskeletal Adexa without tenderness or enlargement.. Digits and nails w/o clubbing, cyanosis, infection, petechiae, ischemia, or inflammatory conditions.Marland Kitchen Psychiatric Judgement and insight Intact.. No evidence of depression, anxiety, or agitation.. General Notes: the lymphedema is increased significantly and there have been 2 open wounds on her right lower extremity which are new. The left lower extremity is also looking worse with circumferential ulceration. There is no cellulitis. Integumentary (Hair, Skin) No suspicious lesions. No crepitus or fluctuance. No peri-wound warmth or erythema. No masses.. Wound #1 status is Open. Original cause of wound was Gradually Appeared. The wound is located on  the Left,Circumferential Lower Leg. The wound measures 10.5cm length x 28.9cm width x 0.3cm depth; 238.329cm^2 area and Koerber, Nyja B. ([id number]) 71.499cm^3 volume. There is no tunneling or undermining noted. There is a large amount of serosanguineous drainage noted. The wound margin is distinct with the outline attached to the wound base. There is small (1-33%) red granulation within the wound bed. There is a large (67-100%) amount of necrotic tissue within the wound bed including Eschar and Adherent Slough. The periwound skin appearance exhibited: Excoriation, Maceration, Erythema. The surrounding wound skin color is noted with erythema which is circumferential. Periwound temperature was noted as No Abnormality. Wound #2 status is Converted. Original cause of wound was Gradually Appeared. The wound is located on the Left,Anterior Lower Leg. Wound #3 status is Converted. Original cause of wound was Gradually Appeared. The wound is located on the Left,Medial Lower Leg. Wound #8 status is Open. Original cause of wound was Gradually Appeared. The wound is located on the Right,Proximal Lower Leg. The wound measures 1cm length x 1cm width x 0.1cm depth; 0.785cm^2 area and 0.079cm^3 volume. There is no tunneling or undermining noted. There is a large amount of serous drainage noted. The wound margin is distinct with the outline attached to the wound base. There is large (67-100%) red granulation within the wound bed. There is a small (1-33%) amount of necrotic tissue within the wound bed including Adherent Slough. The periwound skin appearance did not exhibit: Maceration. Periwound temperature was noted as No Abnormality. The periwound has tenderness on palpation. Wound #9 status is Open. Original cause of wound was Gradually Appeared. The wound is located on the Right,Distal Lower Leg. The wound measures 0.5cm length x 0.9cm width x 0.2cm depth; 0.353cm^2 area and 0.071cm^3 volume. There is  no tunneling or undermining noted. There is a large amount of serous drainage noted. The wound margin is distinct with the outline attached to the wound base. There is small (1-33%) pink granulation within the wound bed. There is a large (67-100%) amount of necrotic tissue within the wound bed including Adherent Slough. The periwound skin appearance exhibited: Maceration. Periwound temperature was noted as No Abnormality. The periwound has tenderness on palpation. 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 Cleanse wound with mild soap and water May Shower, gently pat wound dry prior to applying new dressing. Wound #8 Right,Proximal Lower Leg: Clean wound with Normal Saline. Cleanse wound with mild  soap and water May Shower, gently pat wound dry prior to applying new dressing. Wound #9 Right,Distal Lower Leg: Clean wound with Normal Saline. Cleanse wound with mild soap and water MARICELLA, FILYAW ([id number]) May Shower, gently pat wound dry prior to applying new dressing. Anesthetic (add to Medication List): Wound #1 Left,Circumferential Lower Leg: Topical Lidocaine 4% cream applied to wound bed prior to debridement (In Clinic Only). Wound #8 Right,Proximal Lower Leg: Topical Lidocaine 4% cream applied to wound bed prior to debridement (In Clinic Only). Wound #9 Right,Distal Lower Leg: Topical Lidocaine 4% cream applied to wound bed prior to debridement (In Clinic Only). Skin Barriers/Peri-Wound Care: Wound #1 Left,Circumferential Lower Leg: Barrier cream Wound #8 Right,Proximal Lower Leg: Barrier cream Wound #9 Right,Distal Lower Leg: Barrier  cream Primary Wound Dressing: Wound #1 Left,Circumferential Lower Leg: [place] Non-Adherent Wound #8 Right,Proximal Lower Leg: [place] Non-Adherent Wound #9 Right,Distal Lower Leg: [place] Non-Adherent Secondary Dressing: Wound #1 Left,Circumferential Lower Leg: ABD pad Dry Gauze XtraSorb Other - carboflex - HHRN PLEASE ORDER THIS FOR PATIENT Wound #8 Right,Proximal Lower Leg: ABD pad Dry Gauze XtraSorb Other - carboflex - HHRN PLEASE ORDER THIS FOR PATIENT Wound #9 Right,Distal Lower Leg: ABD pad Dry Gauze XtraSorb Other - carboflex - HHRN PLEASE ORDER THIS FOR PATIENT Dressing Change Frequency: Wound #1 Left,Circumferential Lower Leg: Change Dressing Monday, Wednesday, Friday - HHRN to change Wednesday and Fridays and pt to come to [facility] on Mondays. Wound #8 Right,Proximal Lower Leg: Change Dressing Monday, Wednesday, Friday - HHRN to change Wednesday and Fridays and pt to come to [place] on Mondays. Wound #9 Right,Distal Lower Leg: Change Dressing Monday, Wednesday, Friday - HHRN to change Wednesday and Fridays and pt to come to [facility] on Mondays. Follow-up Appointments: Return Appointment in 1 week. Edema Control: Wound #1 Left,Circumferential Lower Leg: 3 Layer Compression System - Bilateral - unna to anchor Elevate legs to the level of the heart and pump ankles as often as possible Wound #8 Right,Proximal Lower Leg: 3 Layer Compression System - Bilateral - unna to anchor Elevate legs to the level of the heart and pump ankles as often as possible Wound #9 Right,Distal Lower Leg: 3 Layer Compression System - Bilateral - unna to anchor Elevate legs to the level of the heart and pump ankles as often as possible Bresee, Michelina B. ([id number]) Additional Orders / Instructions: Wound #1 Left,Circumferential Lower Leg: Vitamin A; Vitamin C, Zinc Increase protein intake. Wound #8 Right,Proximal Lower Leg: Vitamin A; Vitamin C,  Zinc Increase protein intake. Wound #9 Right,Distal Lower Leg: Vitamin A; Vitamin C, Zinc Increase protein intake. Home Health: Wound #1 Left,Circumferential Lower Leg: [place] Nurse may visit PRN to address patient s wound care needs. FACE TO FACE ENCOUNTER: MEDICARE and MEDICAID PATIENTS: I certify that this patient is under my care and that I had a face-to-face encounter that meets the physician face-to-face encounter requirements with this patient on this date. The encounter with the patient was in whole or in part for the following MEDICAL CONDITION: (primary reason for [place]) MEDICAL NECESSITY: I certify, that based on my findings, NURSING services are a medically necessary home health service. HOME BOUND STATUS: I certify that my clinical findings support that this patient is homebound (i.e., Due to illness or injury, pt requires aid of supportive devices such as crutches, cane, wheelchairs, walkers, the use of special transportation or the assistance of another person to leave their place of residence.  There is a normal inability to leave the home and doing so requires considerable and taxing effort. Other absences are for medical reasons / religious services and are infrequent or of short duration when for other reasons). If current dressing causes regression in wound condition, may D/C ordered dressing product/s and apply Normal Saline Moist Dressing daily until next [place] / Other MD appointment. [place] of regression in wound condition at [phone]. Please direct any NON-WOUND related issues/requests for orders to patient's Primary Care Physician Wound #8 Right,Proximal Lower Leg: [place] Nurse may visit PRN to address patient s wound care needs. FACE TO FACE ENCOUNTER: MEDICARE and MEDICAID PATIENTS: I certify that this patient is under my care and that I had a  face-to-face encounter that meets the physician face-to-face encounter requirements with this patient on this date. The encounter with the patient was in whole or in part for the following MEDICAL CONDITION: (primary reason for [place]) MEDICAL NECESSITY: I certify, that based on my findings, NURSING services are a medically necessary home health service. HOME BOUND STATUS: I certify that my clinical findings support that this patient is homebound (i.e., Due to illness or injury, pt requires aid of supportive devices such as crutches, cane, wheelchairs, walkers, the use of special transportation or the assistance of another person to leave their place of residence. There is a normal inability to leave the home and doing so requires considerable and taxing effort. Other absences are for medical reasons / religious services and are infrequent or of short duration when for other reasons). If current dressing causes regression in wound condition, may D/C ordered dressing product/s and apply Normal Saline Moist Dressing daily until next [place] / Other MD appointment. [place] of regression in wound condition at [phone]. Please direct any NON-WOUND related issues/requests for orders to patient's Primary Care Physician Wound #9 Right,Distal Lower Leg: [place] Nurse may visit PRN to address patient s wound care needs. FACE TO FACE ENCOUNTER: MEDICARE and MEDICAID PATIENTS: I certify that this patient is under my care and that I had a face-to-face encounter that meets the physician face-to-face encounter requirements with this patient on this date. The encounter with the patient was in whole or in part for the following MEDICAL CONDITION: (primary reason for [place]) MEDICAL NECESSITY: I certify, that based on my findings, NURSING services are a medically necessary home health service. HOME BOUND STATUS: I certify that my  clinical findings support that this patient is homebound (i.e., Due to illness or injury, pt requires aid of supportive devices such as crutches, cane, wheelchairs, walkers, the use of special transportation or the assistance of another person to leave their place of residence. There is a normal inability to leave the home and doing so requires considerable and taxing effort. Other absences are for medical reasons / religious services and are infrequent or of short duration when for other reasons). If current dressing causes regression in wound condition, may D/C ordered dressing product/s and apply Normal Saline Moist Dressing daily until next [place] / Other MD appointment. [place] of regression in wound condition at [phone]. Please direct any NON-WOUND related issues/requests for orders to patient's Primary Care Physician Services and Therapies ordered were: AESHA, AGRAWAL ([id number]) Venous Studies -Bilateral - [facility] the right lower extremity opened out and 2 ulcerations again after that healed 2 weeks ago. This is  because she did not use her compression stockings. After review today, I have recommended: 1. Santyl ointment to the left lower extremity to be wrapped with 3 layer Profore to be changed 3 times a week. 2. we have ordered her compression stockings for the right lower extremity both the juxta light and a dual pair of 30-40 mm compression stockings -- she is to bring it along with her the next time she is here 3. Elevation and exercise discussed with her in great detail 4. venous reflux studies bilaterally 5. her husband who is the caregiver and is at the bedside, has had several questions which I have answered to his satisfaction. 6. Regular visits to the wound center. Electronic Signature(s) Signed: 12/18/2016 4:41:50 PM By: Christin Fudge MD, FACS Previous Signature: 12/18/2016 10:36:03 AM Version By: Christin Fudge MD, FACS Entered By:  Christin Fudge on 12/18/2016 16:41:49 [name] ([id number]) -------------------------------------------------------------------------------- ROS/PFSH Details Patient Name: LACRYSTAL, BARBE B. Date of Service: 12/18/2016 9:45 AM Medical Record Number: [id number] Patient Account Number: [id number] Date of Birth/Sex: 07-Aug-1945 (71 y.o. Female) Treating RN: Carolyne Fiscal, Debi Primary Care Provider: Lelon Huh Other Clinician: Referring Provider: Lelon Huh Treating Provider/Extender: Frann Rider in Treatment: 11 Information Obtained From Patient Wound History Do you currently have one or more open woundso Yes How many open wounds do you currently haveo 6 Approximately how long have you had your woundso 2 months How have you been treating your wound(s) until nowo [place] Has your wound(s) ever healed and then re-openedo No Have you had any lab work done in the past montho No Have you tested positive for an antibiotic resistant organism (MRSA, VRE)o No Have you tested positive for osteomyelitis (bone infection)o No Have you had any tests for circulation on your legso Yes Where was the test doneo [facility] Have you had other problems associated with your woundso Infection, Swelling Respiratory Medical History: Positive for: Asthma Cardiovascular Medical History: Positive for: Congestive Heart Failure; Coronary Artery Disease; Hypertension Endocrine Medical History: Positive for: Type II Diabetes Time with diabetes: a long time Treated with: Oral agents Blood sugar tested every day: No Musculoskeletal Medical History: Positive for: Osteoarthritis Immunizations Pneumococcal Vaccine: Received Pneumococcal Vaccination: Yes Implantable Devices Family and Social History Cancer: Yes - Mother,Siblings; Diabetes: Yes - Mother,Siblings; Heart Disease: Yes - Mother; Hereditary Spherocytosis: No; Hypertension: Yes - Siblings; Kidney Disease: No; Lung Disease: No; Seizures: No;  Stroke: No; Thyroid Problems: No; AYSHIA, GRAMLICH B. ([id number]) Tuberculosis: No; Never smoker; Marital Status - Married; Alcohol Use: Never; Drug Use: No History; Caffeine Use: Daily; Financial Concerns: No; Food, Clothing or Shelter Needs: No; Support System Lacking: No; Transportation Concerns: No; Advanced Directives: No; Patient does not want information on Advanced Directives; Do not resuscitate: No; Living Will: No; Medical Power of Attorney: No Physician Affirmation I have reviewed and agree with the above information. Electronic Signature(s) Signed: 12/18/2016 10:50:58 AM By: Christin Fudge MD, FACS Signed: 12/18/2016 5:07:23 PM By: Alric Quan Entered By: Christin Fudge on 12/18/2016 10:33:19 [name] ([id number]) -------------------------------------------------------------------------------- [place] Details Patient Name: MICA, RAMDASS B. Date of Service: 12/18/2016 Medical Record Number: [id number] Patient Account Number: [id number] Date of Birth/Sex: [birth year] (71 y.o. Female) Treating RN: Ahmed Prima Primary Care Provider: Lelon Huh Other Clinician: Referring Provider: Lelon Huh Treating Provider/Extender: Frann Rider in Treatment: 11 Diagnosis Coding ICD-10 Codes Code Description E11.622 Type 2 diabetes mellitus with other skin ulcer I89.0 Lymphedema, not elsewhere classified L97.212 Non-pressure chronic ulcer of right calf with fat layer exposed B70.488  Non-pressure chronic ulcer of left calf with fat layer exposed Facility Procedures CPT4: Description Modifier Quantity Code [id number] BILATERAL: Application of multi-layer venous compression system; leg (below 1 knee), including ankle and foot. Physician Procedures CPT4 Code: 0404591 Description: 36859 - WC PHYS LEVEL 3 - EST PT ICD-10 Diagnosis Description E11.622 Type 2 diabetes mellitus with other skin ulcer I89.0 Lymphedema, not elsewhere classified L97.212 Non-pressure chronic  ulcer of right calf with fat layer exp L97.222  Non-pressure chronic ulcer of left calf with fat layer expo Modifier: osed sed Quantity: 1 Electronic Signature(s) Signed: 12/18/2016 1:42:46 PM By: Alric Quan Signed: 12/18/2016 4:44:52 PM By: Christin Fudge MD, FACS Previous Signature: 12/18/2016 10:36:16 AM Version By: Christin Fudge MD, FACS Entered By: Alric Quan on 12/18/2016 13:42:45

## 2016-12-19 NOTE — Progress Notes (Signed)
[name], [name] ([id number]) Visit Report for 12/18/2016 Arrival Information Details Patient Name: [name], [name]. Date of Service: 12/18/2016 9:45 AM Medical Record Number: [id number] Patient Account Number: [id number] Date of Birth/Sex: 04/26/1945 (71 y.o. Female) Treating RN: Ahmed Prima Primary Care Chakara Bognar: Lelon Huh Other Clinician: Referring Cline Draheim: Lelon Huh Treating Jakim Drapeau/Extender: Frann Rider in Treatment: 11 Visit Information History Since Last Visit All ordered tests and consults were completed: No Patient Arrived: Ambulatory Added or deleted any medications: No Arrival Time: 09:41 Any new allergies or adverse reactions: No Accompanied By: husband Had a fall or experienced change in No Transfer Assistance: EasyPivot Patient activities of daily living that may affect Lift risk of falls: Patient Identification Verified: Yes Signs or symptoms of abuse/neglect since last visito No Secondary Verification Process Yes Hospitalized since last visit: No Completed: Has Dressing in Place as Prescribed: Yes Patient Requires Transmission-Based No Precautions: Has Compression in Place as Prescribed: Yes Patient Has Alerts: Yes Pain Present Now: No Electronic Signature(s) Signed: 12/18/2016 5:07:23 PM By: Alric Quan Entered By: Alric Quan on 12/18/2016 09:42:05 [name] ([id number]) -------------------------------------------------------------------------------- Encounter Discharge Information Details Patient Name: [name], Jillian B. Date of Service: 12/18/2016 9:45 AM Medical Record Number: [id number] Patient Account Number: [id number] Date of Birth/Sex: 1945-07-12 (71 y.o. Female) Treating RN: Ahmed Prima Primary Care Amoria Mclees: Lelon Huh Other Clinician: Referring Evelean Bigler: Lelon Huh Treating Elke Holtry/Extender: Frann Rider in Treatment: 29 Encounter Discharge Information Items Discharge Pain Level:  0 Discharge Condition: Stable Ambulatory Status: Wheelchair Discharge Destination: Home Transportation: Private Auto Accompanied By: husband Schedule Follow-up Appointment: Yes Medication Reconciliation completed and No provided to Patient/Care Kymari Lollis: Provided on Clinical Summary of Care: 12/18/2016 Form Type Recipient Paper Patient CS Electronic Signature(s) Signed: 12/19/2016 11:15:46 AM By: Ruthine Dose Entered By: Ruthine Dose on 12/18/2016 10:52:59 [name] ([id number]) -------------------------------------------------------------------------------- Lower Extremity Assessment Details Patient Name: Jillian Albe B. Date of Service: 12/18/2016 9:45 AM Medical Record Number: [id number] Patient Account Number: [id number] Date of Birth/Sex: Jun 27, 1945 (71 y.o. Female) Treating RN: Carolyne Fiscal, Debi Primary Care Horace Wishon: Lelon Huh Other Clinician: Referring Mara Favero: Lelon Huh Treating Cece Milhouse/Extender: Frann Rider in Treatment: 11 Edema Assessment Assessed: [Left: No] [Right: No] [Left: Edema] [Right: :] Calf Left: Right: Point of Measurement: 30 cm From Medial Instep 46.8 cm 47.5 cm Ankle Left: Right: Point of Measurement: 10 cm From Medial Instep 25.8 cm 25.1 cm Vascular Assessment Pulses: Dorsalis Pedis Palpable: [Left:Yes] [Right:Yes] Posterior Tibial Extremity colors, hair growth, and conditions: Extremity Color: [Left:Red] [Right:Red] Temperature of Extremity: [Left:Warm] [Right:Warm] Capillary Refill: [Left:< 3 seconds] [Right:< 3 seconds] Toe Nail Assessment Left: Right: Thick: Yes Yes Discolored: Yes Yes Deformed: No No Improper Length and Hygiene: No No Electronic Signature(s) Signed: 12/18/2016 5:07:23 PM By: Alric Quan Entered By: Alric Quan on 12/18/2016 10:10:35 [name] ([id number]) -------------------------------------------------------------------------------- Multi Wound Chart Details Patient  Name: Jillian Albe B. Date of Service: 12/18/2016 9:45 AM Medical Record Number: [id number] Patient Account Number: [id number] Date of Birth/Sex: 03/22/1945 (71 y.o. Female) Treating RN: Ahmed Prima Primary Care Thadd Apuzzo: Lelon Huh Other Clinician: Referring Shikita Vaillancourt: Lelon Huh Treating Xan Sparkman/Extender: Frann Rider in Treatment: 11 Vital Signs Height(in): 64 Pulse(bpm): 61 Weight(lbs): 209 Blood Pressure(mmHg): 149/57 Body Mass Index(BMI): 36 Temperature(F): 97.7 Respiratory Rate 16 (breaths/min): Photos: [1:No Photos] [2:No Photos] [3:No Photos] Wound Location: [1:Left Lower Leg - Circumfernential] [2:Left, Anterior Lower Leg] [3:Left, Medial Lower Leg] Wounding Event: [1:Gradually Appeared] [2:Gradually Appeared] [3:Gradually Appeared] Primary Etiology: [1: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] [2:Lower  Extremity] [3:Diabetic Wound/Ulcer of the Lower Extremity] Comorbid History: [1:Asthma, Congestive Heart Failure, Coronary Artery Disease, Hypertension, Type II Diabetes, Osteoarthritis] [2:N/A] [3:N/A] Date Acquired: [1:08/02/2016] [2:08/02/2016] [3:08/02/2016] Weeks of Treatment: [1:11] [2:11] [3:11] Wound Status: [1:Open] [2:Converted] [3:Converted] Measurements L x W x D [1:10.5x28.9x0.3] [2:N/A] [3:N/A] (cm) Area (cm) : [1:238.329] [2:N/A] [3:N/A] Volume (cm) : [1:71.499] [2:N/A] [3:N/A] % Reduction in Area: [1:-1279.30%] [2:N/A] [3:N/A] % Reduction in Volume: [1:-4037.70%] [2:N/A] [3:N/A] Classification: [1:Grade 2] [2:Grade 1] [3:Grade 1] Exudate Amount: [1:Large] [2:N/A] [3:N/A] Exudate Type: [1:Serosanguineous] [2:N/A] [3:N/A] Exudate Color: [1:red, brown] [2:N/A] [3:N/A] Wound Margin: [1:Distinct, outline attached] [2:N/A] [3:N/A] Granulation Amount: [1:Small (1-33%)] [2:N/A] [3:N/A] Granulation Quality: [1:Red] [2:N/A] [3:N/A] Necrotic Amount: [1:Large (67-100%)] [2:N/A] [3:N/A] Necrotic Tissue: [1:Eschar,  Adherent Slough] [2:N/A] [3:N/A] Epithelialization: [1:None] [2:N/A] [3:N/A] Periwound Skin Texture: [1:Excoriation: Yes] [2:No Abnormalities Noted] [3:No Abnormalities Noted] Periwound Skin Moisture: [1:Maceration: Yes] [2:No Abnormalities Noted] [3:No Abnormalities Noted] Periwound Skin Color: [1:Erythema: Yes] [2:No Abnormalities Noted] [3:No Abnormalities Noted] Erythema Location: [1:Circumferential] [2:N/A] [3:N/A] Temperature: [1:No Abnormality] [2:N/A] [3:N/A] Tenderness on Palpation: [1:No] [2:No] [3:No] Wound Preparation: [1:Ulcer Cleansing: Rinsed/Irrigated with Saline,] [2:N/A] [3:N/A] Other: soap and water Topical Anesthetic Applied: Other: lidocaine 4% Wound Number: 8 9 N/A Photos: No Photos No Photos N/A Wound Location: Right Lower Leg - Proximal Right Lower Leg - Distal N/A Wounding Event: Gradually Appeared Gradually Appeared N/A Primary Etiology: Lymphedema Lymphedema N/A Comorbid History: Asthma, Congestive Heart Asthma, Congestive Heart N/A Failure, Coronary Artery Failure, Coronary Artery Disease, Hypertension, Type II Disease, Hypertension, Type II Diabetes, Osteoarthritis Diabetes, Osteoarthritis Date Acquired: 12/18/2016 12/18/2016 N/A Weeks of Treatment: 0 0 N/A Wound Status: Open Open N/A Measurements L x W x D 1x1x0.1 0.5x0.9x0.2 N/A (cm) Area (cm) : 0.785 0.353 N/A Volume (cm) : 0.079 0.071 N/A % Reduction in Area: N/A N/A N/A % Reduction in Volume: N/A N/A N/A Classification: Partial Thickness Partial Thickness N/A Exudate Amount: Large Large N/A Exudate Type: Serous Serous N/A Exudate Color: amber amber N/A Wound Margin: Distinct, outline attached Distinct, outline attached N/A Granulation Amount: Large (67-100%) Small (1-33%) N/A Granulation Quality: Red Pink N/A Necrotic Amount: Small (1-33%) Large (67-100%) N/A Necrotic Tissue: Adherent Slough Adherent Slough N/A Exposed Structures: Fascia: No Fascia: No N/A Fat Layer (Subcutaneous Fat  Layer (Subcutaneous Tissue) Exposed: No Tissue) Exposed: No Tendon: No Tendon: No Muscle: No Muscle: No Joint: No Joint: No Bone: No Bone: No Epithelialization: None None N/A Periwound Skin Texture: No Abnormalities Noted No Abnormalities Noted N/A Periwound Skin Moisture: Maceration: No Maceration: Yes N/A Periwound Skin Color: No Abnormalities Noted No Abnormalities Noted N/A Erythema Location: N/A N/A N/A Temperature: No Abnormality No Abnormality N/A Tenderness on Palpation: Yes Yes N/A Wound Preparation: Ulcer Cleansing: Ulcer Cleansing: N/A Rinsed/Irrigated with Saline, Rinsed/Irrigated with Saline, Other: soap and water Other: soap and water Topical Anesthetic Applied: Topical Anesthetic Applied: Other: lidocaine 4% Other: lidocaine 4% Treatment Notes Electronic Signature(s) [name], [name] ([id number]) Signed: 12/18/2016 10:32:14 AM By: Christin Fudge MD, FACS Entered By: Christin Fudge on 12/18/2016 10:32:14 [name] ([id number]) -------------------------------------------------------------------------------- [place] Details Patient Name: [name], [name]. Date of Service: 12/18/2016 9:45 AM Medical Record Number: [id number] Patient Account Number: [id number] Date of Birth/Sex: February 27, 1945 (71 y.o. Female) Treating RN: Ahmed Prima Primary Care Bryton Romagnoli: Lelon Huh Other Clinician: Referring Albirda Shiel: Lelon Huh Treating Amore Ackman/Extender: Frann Rider in Treatment: 11 Active Inactive ` Abuse / Safety / Falls / Self Care Management Nursing Diagnoses: History of Falls Potential for falls Goals: Patient will not experience any injury  related to falls Date Initiated: 10/02/2016 Target Resolution Date: 01/06/2017 Goal Status: Active Interventions: Assess: immobility, friction, shearing, incontinence upon admission and as needed Assess impairment of mobility on admission and as needed per policy Assess personal safety  and home safety (as indicated) on admission and as needed Notes: ` Nutrition Nursing Diagnoses: Imbalanced nutrition Impaired glucose control: actual or potential Potential for alteratiion in Nutrition/Potential for imbalanced nutrition Goals: Patient/caregiver agrees to and verbalizes understanding of need to use nutritional supplements and/or vitamins as prescribed Date Initiated: 10/02/2016 Target Resolution Date: 02/10/2017 Goal Status: Active Patient/caregiver will maintain therapeutic glucose control Date Initiated: 10/02/2016 Target Resolution Date: 01/06/2017 Goal Status: Active Interventions: Assess patient nutrition upon admission and as needed per policy Provide education on elevated blood sugars and impact on wound healing Provide education on nutrition Treatment Activities: Education provided on Nutrition : 10/02/2016 [name], [name] ([id number]) Notes: ` Orientation to the Wound Care Program Nursing Diagnoses: Knowledge deficit related to the wound healing center program Goals: Patient/caregiver will verbalize understanding of the [place] Date Initiated: 10/02/2016 Target Resolution Date: 11/11/2016 Goal Status: Active Interventions: Provide education on orientation to the wound center Notes: ` Pain, Acute or Chronic Nursing Diagnoses: Pain, acute or chronic: actual or potential Potential alteration in comfort, pain Goals: Patient/caregiver will verbalize adequate pain control between visits Date Initiated: 10/02/2016 Target Resolution Date: 02/10/2017 Goal Status: Active Interventions: Complete pain assessment as per visit requirements Notes: ` Wound/Skin Impairment Nursing Diagnoses: Impaired tissue integrity Knowledge deficit related to ulceration/compromised skin integrity Goals: Ulcer/skin breakdown will have a volume reduction of 80% by week 12 Date Initiated: 10/02/2016 Target Resolution Date: 02/03/2017 Goal Status:  Active Interventions: Assess patient/caregiver ability to perform ulcer/skin care regimen upon admission and as needed Assess ulceration(s) every visit Notes: [name], [name] ([id number]) Electronic Signature(s) Signed: 12/18/2016 5:07:23 PM By: Alric Quan Entered By: Alric Quan on 12/18/2016 10:11:10 [name] ([id number]) -------------------------------------------------------------------------------- Pain Assessment Details Patient Name: Jillian Albe B. Date of Service: 12/18/2016 9:45 AM Medical Record Number: [id number] Patient Account Number: [id number] Date of Birth/Sex: [birth year] (71 y.o. Female) Treating RN: Ahmed Prima Primary Care Miciah Covelli: Lelon Huh Other Clinician: Referring Elie Gragert: Lelon Huh Treating Eryca Bolte/Extender: Frann Rider in Treatment: 11 Active Problems Location of Pain Severity and Description of Pain Patient Has Paino No Site Locations Pain Management and Medication Current Pain Management: Electronic Signature(s) Signed: 12/18/2016 5:07:23 PM By: Alric Quan Entered By: Alric Quan on 12/18/2016 09:42:17 [name] ([id number]) -------------------------------------------------------------------------------- Patient/Caregiver Education Details Patient Name: [name]. Date of Service: 12/18/2016 9:45 AM Medical Record Number: [id number] Patient Account Number: [id number] Date of Birth/Gender: 1945/10/15 (71 y.o. Female) Treating RN: Ahmed Prima Primary Care Physician: Lelon Huh Other Clinician: Referring Physician: Lelon Huh Treating Physician/Extender: Frann Rider in Treatment: 11 Education Assessment Education Provided To: Patient Education Topics Provided Wound/Skin Impairment: Handouts: Other: change dressing as ordered Methods: Demonstration, Explain/Verbal Responses: State content correctly Electronic Signature(s) Signed: 12/18/2016 5:07:23 PM By:  Alric Quan Entered By: Alric Quan on 12/18/2016 10:18:00 [name] ([id number]) -------------------------------------------------------------------------------- Wound Assessment Details Patient Name: [name], Jillian B. Date of Service: 12/18/2016 9:45 AM Medical Record Number: [id number] Patient Account Number: [id number] Date of Birth/Sex: 1945/10/24 (71 y.o. Female) Treating RN: Ahmed Prima Primary Care Pruitt Taboada: Lelon Huh Other Clinician: Referring Nahla Lukin: Lelon Huh Treating Anahlia Iseminger/Extender: Frann Rider in Treatment: 11 Wound Status Wound Number: 1 Primary Diabetic Wound/Ulcer of the Lower Extremity Etiology: Wound Location: Left Lower Leg - Circumfernential Wound Open Wounding  Event: Gradually Appeared Status: Date Acquired: 08/02/2016 Comorbid Asthma, Congestive Heart Failure, Coronary Weeks Of Treatment: 11 History: Artery Disease, Hypertension, Type II Diabetes, Clustered Wound: No Osteoarthritis Photos Photo Uploaded By: Alric Quan on 12/18/2016 16:06:40 Wound Measurements Length: (cm) 10.5 Width: (cm) 28.9 Depth: (cm) 0.3 Area: (cm) 238.329 Volume: (cm) 71.499 % Reduction in Area: -1279.3% % Reduction in Volume: -4037.7% Epithelialization: None Tunneling: No Undermining: No Wound Description Classification: Grade 2 Wound Margin: Distinct, outline attached Exudate Amount: Large Exudate Type: Serosanguineous Exudate Color: red, brown Foul Odor After Cleansing: No Slough/Fibrino Yes Wound Bed Granulation Amount: Small (1-33%) Granulation Quality: Red Necrotic Amount: Large (67-100%) Necrotic Quality: Eschar, Adherent Slough Periwound Skin Texture Texture Color No Abnormalities Noted: No No Abnormalities Noted: No Excoriation: Yes Erythema: Yes [name], [name] B. ([id number]) Moisture Erythema Location: Circumferential No Abnormalities Noted: No Temperature / Pain Maceration: Yes Temperature: No  Abnormality Wound Preparation Ulcer Cleansing: Rinsed/Irrigated with Saline, Other: soap and water, Topical Anesthetic Applied: Other: lidocaine 4%, Treatment Notes Wound #1 (Left, Circumferential Lower Leg) 1. Cleansed with: Clean wound with Normal Saline Cleanse wound with antibacterial soap and water 2. Anesthetic Topical Lidocaine 4% cream to wound bed prior to debridement 4. Dressing Applied: Other dressing (specify in notes) 5. Secondary Dressing Applied ABD Pad Dry Gauze 7. Secured with Tape 3 Layer Compression System - Bilateral Notes silvercel, unna to anchor, xtrasorb, charcoal Electronic Signature(s) Signed: 12/18/2016 5:07:23 PM By: Alric Quan Entered By: Alric Quan on 12/18/2016 09:59:29 [name] ([id number]) -------------------------------------------------------------------------------- Wound Assessment Details Patient Name: [name], Jillian B. Date of Service: 12/18/2016 9:45 AM Medical Record Number: [id number] Patient Account Number: [id number] Date of Birth/Sex: [birth year] (71 y.o. Female) Treating RN: Ahmed Prima Primary Care Kathya Wilz: Lelon Huh Other Clinician: Referring Kyona Chauncey: Lelon Huh Treating Bobbiejo Ishikawa/Extender: Frann Rider in Treatment: 11 Wound Status Wound Number: 2 Primary Diabetic Wound/Ulcer of the Lower Etiology: Extremity Wound Location: Left, Anterior Lower Leg Wound Status: Converted Wounding Event: Gradually Appeared Date Acquired: 08/02/2016 Weeks Of Treatment: 11 Clustered Wound: No Photos Photo Uploaded By: Alric Quan on 12/18/2016 16:09:29 Wound Description Classification: Grade 1 Periwound Skin Texture Texture Color No Abnormalities Noted: No No Abnormalities Noted: No Moisture No Abnormalities Noted: No Electronic Signature(s) Signed: 12/18/2016 5:07:23 PM By: Alric Quan Entered By: Alric Quan on 12/18/2016 09:57:44 Myrick, Jeanice Lim  ([id number]) -------------------------------------------------------------------------------- Wound Assessment Details Patient Name: Jillian Albe B. Date of Service: 12/18/2016 9:45 AM Medical Record Number: [id number] Patient Account Number: [id number] Date of Birth/Sex: 1945-02-28 (71 y.o. Female) Treating RN: Ahmed Prima Primary Care Maryagnes Carrasco: Lelon Huh Other Clinician: Referring Anatasia Tino: Lelon Huh Treating Culver Feighner/Extender: Frann Rider in Treatment: 11 Wound Status Wound Number: 3 Primary Diabetic Wound/Ulcer of the Lower Etiology: Extremity Wound Location: Left, Medial Lower Leg Wound Status: Converted Wounding Event: Gradually Appeared Date Acquired: 08/02/2016 Weeks Of Treatment: 11 Clustered Wound: No Photos Photo Uploaded By: Alric Quan on 12/18/2016 16:09:30 Wound Description Classification: Grade 1 Periwound Skin Texture Texture Color No Abnormalities Noted: No No Abnormalities Noted: No Moisture No Abnormalities Noted: No Electronic Signature(s) Signed: 12/18/2016 5:07:23 PM By: Alric Quan Entered By: Alric Quan on 12/18/2016 09:57:45 Whipp, Jeanice Lim ([id number]) -------------------------------------------------------------------------------- Wound Assessment Details Patient Name: Jillian Albe B. Date of Service: 12/18/2016 9:45 AM Medical Record Number: [id number] Patient Account Number: [id number] Date of Birth/Sex: [birth year] (71 y.o. Female) Treating RN: Ahmed Prima Primary Care Khya Halls: Lelon Huh Other Clinician: Referring Deandra Gadson: Lelon Huh Treating Dewan Emond/Extender: Frann Rider in Treatment: 11 Wound Status Wound Number: 8 Primary  Lymphedema Etiology: Wound Location: Right Lower Leg - Proximal Wound Open Wounding Event: Gradually Appeared Status: Date Acquired: 12/18/2016 Comorbid Asthma, Congestive Heart Failure, Coronary Weeks Of Treatment: 0 History: Artery Disease, Hypertension,  Type II Diabetes, Clustered Wound: No Osteoarthritis Photos Photo Uploaded By: Alric Quan on 12/18/2016 16:07:17 Wound Measurements Length: (cm) 1 Width: (cm) 1 Depth: (cm) 0.1 Area: (cm) 0.785 Volume: (cm) 0.079 % Reduction in Area: % Reduction in Volume: Epithelialization: None Tunneling: No Undermining: No Wound Description Classification: Partial Thickness Wound Margin: Distinct, outline attached Exudate Amount: Large Exudate Type: Serous Exudate Color: amber Foul Odor After Cleansing: No Slough/Fibrino Yes Wound Bed Granulation Amount: Large (67-100%) Exposed Structure Granulation Quality: Red Fascia Exposed: No Necrotic Amount: Small (1-33%) Fat Layer (Subcutaneous Tissue) Exposed: No Necrotic Quality: Adherent Slough Tendon Exposed: No Muscle Exposed: No Joint Exposed: No Bone Exposed: No Periwound Skin Texture MEIRA, WAHBA B. ([id number]) Texture Color No Abnormalities Noted: No No Abnormalities Noted: No Moisture Temperature / Pain No Abnormalities Noted: No Temperature: No Abnormality Maceration: No Tenderness on Palpation: Yes Wound Preparation Ulcer Cleansing: Rinsed/Irrigated with Saline, Other: soap and water, Topical Anesthetic Applied: Other: lidocaine 4%, Treatment Notes Wound #8 (Right, Proximal Lower Leg) 1. Cleansed with: Clean wound with Normal Saline Cleanse wound with antibacterial soap and water 2. Anesthetic Topical Lidocaine 4% cream to wound bed prior to debridement 4. Dressing Applied: Other dressing (specify in notes) 5. Secondary Dressing Applied ABD Pad Dry Gauze 7. Secured with Tape 3 Layer Compression System - Bilateral Notes silvercel, unna to anchor, xtrasorb, charcoal Electronic Signature(s) Signed: 12/18/2016 5:07:23 PM By: Alric Quan Entered By: Alric Quan on 12/18/2016 10:02:27 [name]  ([id number]) -------------------------------------------------------------------------------- Wound Assessment Details Patient Name: BERGEN, MELLE B. Date of Service: 12/18/2016 9:45 AM Medical Record Number: [id number] Patient Account Number: [id number] Date of Birth/Sex: 1945-09-15 (71 y.o. Female) Treating RN: Carolyne Fiscal, Debi Primary Care Frederik Standley: Lelon Huh Other Clinician: Referring Maya Arcand: Lelon Huh Treating Maguire Sime/Extender: Frann Rider in Treatment: 11 Wound Status Wound Number: 9 Primary Lymphedema Etiology: Wound Location: Right Lower Leg - Distal Wound Open Wounding Event: Gradually Appeared Status: Date Acquired: 12/18/2016 Comorbid Asthma, Congestive Heart Failure, Coronary Weeks Of Treatment: 0 History: Artery Disease, Hypertension, Type II Diabetes, Clustered Wound: No Osteoarthritis Photos Photo Uploaded By: Alric Quan on 12/18/2016 16:07:42 Wound Measurements Length: (cm) 0.5 Width: (cm) 0.9 Depth: (cm) 0.2 Area: (cm) 0.353 Volume: (cm) 0.071 % Reduction in Area: % Reduction in Volume: Epithelialization: None Tunneling: No Undermining: No Wound Description Classification: Partial Thickness Wound Margin: Distinct, outline attached Exudate Amount: Large Exudate Type: Serous Exudate Color: amber Foul Odor After Cleansing: No Slough/Fibrino Yes Wound Bed Granulation Amount: Small (1-33%) Exposed Structure Granulation Quality: Pink Fascia Exposed: No Necrotic Amount: Large (67-100%) Fat Layer (Subcutaneous Tissue) Exposed: No Necrotic Quality: Adherent Slough Tendon Exposed: No Muscle Exposed: No Joint Exposed: No Bone Exposed: No Periwound Skin Texture CAMRY, ROBELLO B. ([id number]) Texture Color No Abnormalities Noted: No No Abnormalities Noted: No Moisture Temperature / Pain No Abnormalities Noted: No Temperature: No Abnormality Maceration: Yes Tenderness on Palpation: Yes Wound Preparation Ulcer  Cleansing: Rinsed/Irrigated with Saline, Other: soap and water, Topical Anesthetic Applied: Other: lidocaine 4%, Treatment Notes Wound #9 (Right, Distal Lower Leg) 1. Cleansed with: Clean wound with Normal Saline Cleanse wound with antibacterial soap and water 2. Anesthetic Topical Lidocaine 4% cream to wound bed prior to debridement 4. Dressing Applied: Other dressing (specify in notes) 5. Secondary Dressing Applied ABD Pad Dry Gauze 7. Secured with Tape 3  Layer Compression System - Bilateral Notes silvercel, unna to anchor, xtrasorb, charcoal Electronic Signature(s) Signed: 12/18/2016 5:07:23 PM By: Alric Quan Entered By: Alric Quan on 12/18/2016 10:04:12 [name] ([id number]) -------------------------------------------------------------------------------- [place] Details Patient Name: Jillian Albe B. Date of Service: 12/18/2016 9:45 AM Medical Record Number: [id number] Patient Account Number: [id number] Date of Birth/Sex: 1945/01/14 (71 y.o. Female) Treating RN: Carolyne Fiscal, Debi Primary Care Sameena Artus: Lelon Huh Other Clinician: Referring Marlin Jarrard: Lelon Huh Treating Larenzo Caples/Extender: Frann Rider in Treatment: 11 Vital Signs Time Taken: 09:42 Temperature (F): 97.7 Height (in): 64 Pulse (bpm): 61 Weight (lbs): 209 Respiratory Rate (breaths/min): 16 Body Mass Index (BMI): 35.9 Blood Pressure (mmHg): 149/57 Reference Range: 80 - 120 mg / dl Electronic Signature(s) Signed: 12/18/2016 5:07:23 PM By: Alric Quan Entered By: Alric Quan on 12/18/2016 09:47:15

## 2016-12-20 DIAGNOSIS — I509 Heart failure, unspecified: Secondary | ICD-10-CM | POA: Diagnosis not present

## 2016-12-20 DIAGNOSIS — J45909 Unspecified asthma, uncomplicated: Secondary | ICD-10-CM | POA: Diagnosis not present

## 2016-12-20 DIAGNOSIS — M1990 Unspecified osteoarthritis, unspecified site: Secondary | ICD-10-CM | POA: Diagnosis not present

## 2016-12-20 DIAGNOSIS — Z79891 Long term (current) use of opiate analgesic: Secondary | ICD-10-CM | POA: Diagnosis not present

## 2016-12-20 DIAGNOSIS — E11622 Type 2 diabetes mellitus with other skin ulcer: Secondary | ICD-10-CM | POA: Diagnosis not present

## 2016-12-20 DIAGNOSIS — L97222 Non-pressure chronic ulcer of left calf with fat layer exposed: Secondary | ICD-10-CM | POA: Diagnosis not present

## 2016-12-20 DIAGNOSIS — I110 Hypertensive heart disease with heart failure: Secondary | ICD-10-CM | POA: Diagnosis not present

## 2016-12-20 DIAGNOSIS — Z7984 Long term (current) use of oral hypoglycemic drugs: Secondary | ICD-10-CM | POA: Diagnosis not present

## 2016-12-20 DIAGNOSIS — I2510 Atherosclerotic heart disease of native coronary artery without angina pectoris: Secondary | ICD-10-CM | POA: Diagnosis not present

## 2016-12-20 DIAGNOSIS — I272 Pulmonary hypertension, unspecified: Secondary | ICD-10-CM | POA: Diagnosis not present

## 2016-12-20 DIAGNOSIS — Z4800 Encounter for change or removal of nonsurgical wound dressing: Secondary | ICD-10-CM | POA: Diagnosis not present

## 2016-12-20 DIAGNOSIS — I429 Cardiomyopathy, unspecified: Secondary | ICD-10-CM | POA: Diagnosis not present

## 2016-12-21 ENCOUNTER — Ambulatory Visit (INDEPENDENT_AMBULATORY_CARE_PROVIDER_SITE_OTHER): Payer: Medicare Other | Admitting: Family Medicine

## 2016-12-21 ENCOUNTER — Encounter: Payer: Self-pay | Admitting: Family Medicine

## 2016-12-21 VITALS — BP 128/70 | HR 70 | Temp 97.6°F | Resp 18 | Wt 203.0 lb

## 2016-12-21 DIAGNOSIS — I517 Cardiomegaly: Secondary | ICD-10-CM

## 2016-12-21 DIAGNOSIS — N390 Urinary tract infection, site not specified: Secondary | ICD-10-CM | POA: Diagnosis not present

## 2016-12-21 DIAGNOSIS — I5022 Chronic systolic (congestive) heart failure: Secondary | ICD-10-CM | POA: Diagnosis not present

## 2016-12-21 DIAGNOSIS — I10 Essential (primary) hypertension: Secondary | ICD-10-CM | POA: Diagnosis not present

## 2016-12-21 DIAGNOSIS — R319 Hematuria, unspecified: Secondary | ICD-10-CM

## 2016-12-21 DIAGNOSIS — E1121 Type 2 diabetes mellitus with diabetic nephropathy: Secondary | ICD-10-CM

## 2016-12-21 LAB — POCT URINALYSIS DIPSTICK
Glucose, UA: NEGATIVE
KETONES UA: NEGATIVE
LEUKOCYTES UA: NEGATIVE
NITRITE UA: NEGATIVE
PH UA: 6 (ref 5.0–8.0)
Spec Grav, UA: 1.02 (ref 1.010–1.025)
UROBILINOGEN UA: 0.2 U/dL

## 2016-12-21 MED ORDER — HYDROCODONE-ACETAMINOPHEN 10-325 MG PO TABS: 1.0000 | ORAL_TABLET | Freq: Three times a day (TID) | ORAL | 0 refills | Status: DC | PRN

## 2016-12-21 NOTE — Progress Notes (Signed)
Patient: [name] Female    DOB: 1945-11-05   71 y.o.   MRN: [id number] Visit Date: 12/21/2016  Today's Provider: Lelon Huh, MD   Chief Complaint  Patient presents with  . Hypertension  . Diabetes  . Hyperlipidemia  . Congestive Heart Failure   Subjective:    HPI   Diabetes Mellitus Type II, Follow-up:   Lab Results  Component Value Date   HGBA1C 7.7 09/21/2016   HGBA1C 7.6 05/18/2016   HGBA1C 6.6 01/25/2016   Last seen for diabetes 3 months ago.  Management since then includes; no changes. She reports good compliance with treatment. She is not having side effects.  Current symptoms include none and have been stable. Home blood sugar records: blood sugars are not checked at home  Episodes of hypoglycemia? no   Current Insulin Regimen: none Most Recent Eye Exam: <1 year ago Weight trend: stable Prior visit with dietician: no Current diet: well balanced Current exercise: none  ------------------------------------------------------------------------   Hypertension, follow-up:  BP Readings from Last 3 Encounters:  11/14/16 110/77  11/08/16 112/62  09/21/16 (!) 118/58    She was last seen for hypertension 3 months ago.  BP at that visit was 118/58. Management since that visit includes; no changes.She reports good compliance with treatment. She is not having side effects.  She is not exercising. She is adherent to low salt diet.   Outside blood pressures are not being checked. She is experiencing lower extremity edema.  Patient denies chest pain, chest pressure/discomfort, claudication, dyspnea, exertional chest pressure/discomfort, fatigue, irregular heart beat, near-syncope, orthopnea, palpitations, paroxysmal nocturnal dyspnea, syncope and tachypnea.   Cardiovascular risk factors include advanced age (older than 36 for men, 71 for women), diabetes mellitus, dyslipidemia and hypertension.  Use of agents associated with hypertension: NSAIDS.    ------------------------------------------------------------------------    Lipid/Cholesterol, Follow-up:   Last seen for this 3 months ago.  Management since that visit includes; no changes.  Last Lipid Panel:    Component Value Date/Time   CHOL 150 06/22/2016 1226   TRIG 125 06/22/2016 1226   HDL 47 06/22/2016 1226   CHOLHDL 3.2 06/22/2016 1226   LDLCALC 78 06/22/2016 1226    She reports good compliance with treatment. She is not having side effects.   Wt Readings from Last 3 Encounters:  11/14/16 209 lb (94.8 kg)  11/08/16 209 lb (94.8 kg)  09/21/16 206 lb 3.2 oz (93.5 kg)    ------------------------------------------------------------------------  Congestive heart failure From 09/21/2016-no changes were made. She continue regular follow up Dr. Saunders Revel who she last saw in November. At that time she taken off of entresto due to expense and started on lisinopril which she is tolerating well. She continues to have persistent LE swelling and being followed at wound care clinic for chronic LE ulcerations which are slowly healing. She is trying to keep legs elevated as much as possible. Remains frustrated that she has to urinate sore frequently with diuretics and having trouble with bladder control. She was found to have UTI as last visit year which was treated, but she is still having trouble controlling bladder.     Allergies  Allergen Reactions  . Hydrochlorothiazide     Leg Cramps  . Penicillins Rash     Current Outpatient Medications:  .  amLODipine (NORVASC) 5 MG tablet, TAKE 1 TABLET (5 MG TOTAL) BY MOUTH DAILY., Disp: 30 tablet, Rfl: 5 .  aspirin EC 81 MG tablet, Take 1 tablet (81  mg total) by mouth daily., Disp: 90 tablet, Rfl: 3 .  fluticasone furoate-vilanterol (BREO ELLIPTA) 200-25 MCG/INH AEPB, Inhale 1 puff into the lungs daily. FOR ASTHMA J45.20, Disp: 1 each, Rfl: 5 .  HYDROcodone-acetaminophen (NORCO) 10-325 MG tablet, Take 1 tablet by mouth every 8 (eight)  hours as needed., Disp: 90 tablet, Rfl: 0 .  lisinopril (PRINIVIL,ZESTRIL) 20 MG tablet, Take 1 tablet (20 mg total) daily by mouth., Disp: 90 tablet, Rfl: 3 .  lovastatin (MEVACOR) 20 MG tablet, TAKE 1 TABLET (20 MG TOTAL) BY MOUTH AT BEDTIME., Disp: 90 tablet, Rfl: 4 .  meloxicam (MOBIC) 15 MG tablet, Take 1 tablet (15 mg total) by mouth daily as needed for pain. Take with food, Disp: 90 tablet, Rfl: 4 .  metFORMIN (GLUCOPHAGE-XR) 750 MG 24 hr tablet, Take 2 tablets by mouth daily., Disp: , Rfl: 4 .  silver sulfADIAZINE (SILVADENE) 1 % cream, APPLY TO AFFECTED AREA DAILY, Disp: 85 g, Rfl: 5 .  spironolactone (ALDACTONE) 25 MG tablet, TAKE 1 TABLET (25 MG TOTAL) BY MOUTH DAILY., Disp: 30 tablet, Rfl: 12 .  traZODone (DESYREL) 100 MG tablet, TAKE 1 TABLET BY MOUTH EVERY NIGHT AT BEDTIME AS NEEDED FOR INSOMNIA, Disp: 90 tablet, Rfl: 4 .  furosemide (LASIX) 40 MG tablet, Take 1 tablet (40 mg total) by mouth daily., Disp: 90 tablet, Rfl: 3 .  metoprolol (LOPRESSOR) 50 MG tablet, Take 1 tablet (50 mg total) by mouth 2 (two) times daily., Disp: 180 tablet, Rfl: 3  Review of Systems  Constitutional: Negative for appetite change, chills, fatigue and fever.  Respiratory: Negative for chest tightness and shortness of breath.   Cardiovascular: Negative for chest pain and palpitations.  Gastrointestinal: Negative for abdominal pain, nausea and vomiting.  Neurological: Negative for dizziness and weakness.    Social History   Tobacco Use  . Smoking status: Never Smoker  . Smokeless tobacco: Never Used  Substance Use Topics  . Alcohol use: No   Objective:   BP 128/70 (BP Location: Left Arm, Patient Position: Sitting, Cuff Size: Large)   Pulse 70   Temp 97.6 F (36.4 C) (Oral)   Resp 18   Wt 203 lb (92.1 kg) Comment: per husband report  SpO2 99% Comment: room air  BMI 34.84 kg/m  There were no vitals filed for this visit.   Physical Exam   General Appearance:    Alert, cooperative, no  distress  Eyes:    PERRL, conjunctiva/corneas clear, EOM's intact       Lungs:     Clear to auscultation bilaterally, respirations unlabored  Heart:    Regular rate and rhythm, 3+ bipedal edam. No erythema.   Neurologic:   Awake, alert, oriented x 3. No apparent focal neurological           defect.       Results for orders placed or performed in visit on 12/21/16  Urine Culture   eAG (mmol/L) 8.4 (calc)  POCT Urinalysis Dipstick  Result Value Ref Range   Color, UA yellow    Clarity, UA cloudy    Glucose, UA negative    Bilirubin, UA small    Ketones, UA negative    Spec Grav, UA 1.020 1.010 - 1.025   Blood, UA Trace ( Non-hemolyzed)    pH, UA 6.0 5.0 - 8.0   Protein, UA 30 (+)    Urobilinogen, UA 0.2 0.2 or 1.0 E.U./dL   Nitrite, UA negative    Leukocytes, UA Negative Negative  Appearance cloudy    Odor          Assessment & Plan:     1. Diabetes mellitus with nephropathy ([place]) Doing well current medications.  - Hemoglobin A1c  2. Essential hypertension Well controlled, tolerating change from entresto to lisinopril   3. LVH (left ventricular hypertrophy) Continue current medications.    4. Chronic systolic heart failure (H[facility])  - Basic metabolic panel  5. Urinary tract infection with hematuria, site unspecified  - POCT Urinalysis Dipstick - Urine Culture; Future - Urine Culture       Lelon Huh, MD  [facility]

## 2016-12-22 DIAGNOSIS — I272 Pulmonary hypertension, unspecified: Secondary | ICD-10-CM | POA: Diagnosis not present

## 2016-12-22 DIAGNOSIS — I2510 Atherosclerotic heart disease of native coronary artery without angina pectoris: Secondary | ICD-10-CM | POA: Diagnosis not present

## 2016-12-22 DIAGNOSIS — L97222 Non-pressure chronic ulcer of left calf with fat layer exposed: Secondary | ICD-10-CM | POA: Diagnosis not present

## 2016-12-22 DIAGNOSIS — M1990 Unspecified osteoarthritis, unspecified site: Secondary | ICD-10-CM | POA: Diagnosis not present

## 2016-12-22 DIAGNOSIS — J45909 Unspecified asthma, uncomplicated: Secondary | ICD-10-CM | POA: Diagnosis not present

## 2016-12-22 DIAGNOSIS — Z4800 Encounter for change or removal of nonsurgical wound dressing: Secondary | ICD-10-CM | POA: Diagnosis not present

## 2016-12-22 DIAGNOSIS — I429 Cardiomyopathy, unspecified: Secondary | ICD-10-CM | POA: Diagnosis not present

## 2016-12-22 DIAGNOSIS — I110 Hypertensive heart disease with heart failure: Secondary | ICD-10-CM | POA: Diagnosis not present

## 2016-12-22 DIAGNOSIS — Z79891 Long term (current) use of opiate analgesic: Secondary | ICD-10-CM | POA: Diagnosis not present

## 2016-12-22 DIAGNOSIS — I509 Heart failure, unspecified: Secondary | ICD-10-CM | POA: Diagnosis not present

## 2016-12-22 DIAGNOSIS — Z7984 Long term (current) use of oral hypoglycemic drugs: Secondary | ICD-10-CM | POA: Diagnosis not present

## 2016-12-22 DIAGNOSIS — E11622 Type 2 diabetes mellitus with other skin ulcer: Secondary | ICD-10-CM | POA: Diagnosis not present

## 2016-12-22 LAB — BASIC METABOLIC PANEL WITH GFR
BUN / CREAT RATIO: 18 (calc) (ref 6–22)
BUN: 23 mg/dL (ref 7–25)
CALCIUM: 9.6 mg/dL (ref 8.6–10.4)
CHLORIDE: 101 mmol/L (ref 98–110)
CO2: 29 mmol/L (ref 20–32)
Creat: 1.31 mg/dL — ABNORMAL HIGH (ref 0.60–0.93)
GFR, EST AFRICAN AMERICAN: 47 mL/min/{1.73_m2} — AB (ref >=60)
GFR, Est Non African American: 41 mL/min/{1.73_m2} — ABNORMAL LOW (ref >=60)
GLUCOSE: 152 mg/dL — AB (ref 65–99)
POTASSIUM: 4.5 mmol/L (ref 3.5–5.3)
Sodium: 137 mmol/L (ref 135–146)

## 2016-12-22 LAB — HEMOGLOBIN A1C
EAG (MMOL/L): 8.4 (calc)
HEMOGLOBIN A1C: 6.9 %{Hb} — AB (ref <5.7)
MEAN PLASMA GLUCOSE: 151 (calc)

## 2016-12-23 LAB — URINE CULTURE
MICRO NUMBER:: 81437083
SPECIMEN QUALITY:: ADEQUATE

## 2016-12-25 DIAGNOSIS — L97222 Non-pressure chronic ulcer of left calf with fat layer exposed: Secondary | ICD-10-CM | POA: Diagnosis not present

## 2016-12-25 DIAGNOSIS — Z7984 Long term (current) use of oral hypoglycemic drugs: Secondary | ICD-10-CM | POA: Diagnosis not present

## 2016-12-25 DIAGNOSIS — M1990 Unspecified osteoarthritis, unspecified site: Secondary | ICD-10-CM | POA: Diagnosis not present

## 2016-12-25 DIAGNOSIS — I110 Hypertensive heart disease with heart failure: Secondary | ICD-10-CM | POA: Diagnosis not present

## 2016-12-25 DIAGNOSIS — I2510 Atherosclerotic heart disease of native coronary artery without angina pectoris: Secondary | ICD-10-CM | POA: Diagnosis not present

## 2016-12-25 DIAGNOSIS — Z79891 Long term (current) use of opiate analgesic: Secondary | ICD-10-CM | POA: Diagnosis not present

## 2016-12-25 DIAGNOSIS — E11622 Type 2 diabetes mellitus with other skin ulcer: Secondary | ICD-10-CM | POA: Diagnosis not present

## 2016-12-25 DIAGNOSIS — I509 Heart failure, unspecified: Secondary | ICD-10-CM | POA: Diagnosis not present

## 2016-12-25 DIAGNOSIS — Z4800 Encounter for change or removal of nonsurgical wound dressing: Secondary | ICD-10-CM | POA: Diagnosis not present

## 2016-12-25 DIAGNOSIS — I272 Pulmonary hypertension, unspecified: Secondary | ICD-10-CM | POA: Diagnosis not present

## 2016-12-25 DIAGNOSIS — I429 Cardiomyopathy, unspecified: Secondary | ICD-10-CM | POA: Diagnosis not present

## 2016-12-25 DIAGNOSIS — J45909 Unspecified asthma, uncomplicated: Secondary | ICD-10-CM | POA: Diagnosis not present

## 2016-12-27 DIAGNOSIS — I509 Heart failure, unspecified: Secondary | ICD-10-CM | POA: Diagnosis not present

## 2016-12-27 DIAGNOSIS — Z7984 Long term (current) use of oral hypoglycemic drugs: Secondary | ICD-10-CM | POA: Diagnosis not present

## 2016-12-27 DIAGNOSIS — E11622 Type 2 diabetes mellitus with other skin ulcer: Secondary | ICD-10-CM | POA: Diagnosis not present

## 2016-12-27 DIAGNOSIS — I110 Hypertensive heart disease with heart failure: Secondary | ICD-10-CM | POA: Diagnosis not present

## 2016-12-27 DIAGNOSIS — M1990 Unspecified osteoarthritis, unspecified site: Secondary | ICD-10-CM | POA: Diagnosis not present

## 2016-12-27 DIAGNOSIS — I2510 Atherosclerotic heart disease of native coronary artery without angina pectoris: Secondary | ICD-10-CM | POA: Diagnosis not present

## 2016-12-27 DIAGNOSIS — Z4800 Encounter for change or removal of nonsurgical wound dressing: Secondary | ICD-10-CM | POA: Diagnosis not present

## 2016-12-27 DIAGNOSIS — J45909 Unspecified asthma, uncomplicated: Secondary | ICD-10-CM | POA: Diagnosis not present

## 2016-12-27 DIAGNOSIS — Z79891 Long term (current) use of opiate analgesic: Secondary | ICD-10-CM | POA: Diagnosis not present

## 2016-12-27 DIAGNOSIS — I272 Pulmonary hypertension, unspecified: Secondary | ICD-10-CM | POA: Diagnosis not present

## 2016-12-27 DIAGNOSIS — L97222 Non-pressure chronic ulcer of left calf with fat layer exposed: Secondary | ICD-10-CM | POA: Diagnosis not present

## 2016-12-27 DIAGNOSIS — I429 Cardiomyopathy, unspecified: Secondary | ICD-10-CM | POA: Diagnosis not present

## 2016-12-28 ENCOUNTER — Other Ambulatory Visit: Payer: Self-pay | Admitting: Hematology and Oncology

## 2016-12-28 ENCOUNTER — Inpatient Hospital Stay: Payer: Medicare Other | Attending: Hematology and Oncology | Admitting: Hematology and Oncology

## 2016-12-28 ENCOUNTER — Encounter: Payer: Self-pay | Admitting: *Deleted

## 2016-12-28 ENCOUNTER — Other Ambulatory Visit: Payer: Self-pay

## 2016-12-28 ENCOUNTER — Inpatient Hospital Stay: Payer: Medicare Other

## 2016-12-28 VITALS — BP 134/81 | HR 66 | Temp 97.4°F | Resp 20 | Wt 200.8 lb

## 2016-12-28 DIAGNOSIS — Z7984 Long term (current) use of oral hypoglycemic drugs: Secondary | ICD-10-CM | POA: Diagnosis not present

## 2016-12-28 DIAGNOSIS — I2721 Secondary pulmonary arterial hypertension: Secondary | ICD-10-CM | POA: Diagnosis not present

## 2016-12-28 DIAGNOSIS — E119 Type 2 diabetes mellitus without complications: Secondary | ICD-10-CM | POA: Insufficient documentation

## 2016-12-28 DIAGNOSIS — Z808 Family history of malignant neoplasm of other organs or systems: Secondary | ICD-10-CM | POA: Diagnosis not present

## 2016-12-28 DIAGNOSIS — J45909 Unspecified asthma, uncomplicated: Secondary | ICD-10-CM | POA: Insufficient documentation

## 2016-12-28 DIAGNOSIS — I429 Cardiomyopathy, unspecified: Secondary | ICD-10-CM | POA: Diagnosis not present

## 2016-12-28 DIAGNOSIS — L97929 Non-pressure chronic ulcer of unspecified part of left lower leg with unspecified severity: Secondary | ICD-10-CM | POA: Diagnosis not present

## 2016-12-28 DIAGNOSIS — Z79899 Other long term (current) drug therapy: Secondary | ICD-10-CM | POA: Diagnosis not present

## 2016-12-28 DIAGNOSIS — L97919 Non-pressure chronic ulcer of unspecified part of right lower leg with unspecified severity: Secondary | ICD-10-CM | POA: Diagnosis not present

## 2016-12-28 DIAGNOSIS — I350 Nonrheumatic aortic (valve) stenosis: Secondary | ICD-10-CM | POA: Insufficient documentation

## 2016-12-28 DIAGNOSIS — I2510 Atherosclerotic heart disease of native coronary artery without angina pectoris: Secondary | ICD-10-CM | POA: Diagnosis not present

## 2016-12-28 DIAGNOSIS — Z7982 Long term (current) use of aspirin: Secondary | ICD-10-CM | POA: Diagnosis not present

## 2016-12-28 DIAGNOSIS — C9000 Multiple myeloma not having achieved remission: Secondary | ICD-10-CM | POA: Diagnosis not present

## 2016-12-28 DIAGNOSIS — I10 Essential (primary) hypertension: Secondary | ICD-10-CM | POA: Insufficient documentation

## 2016-12-28 DIAGNOSIS — D649 Anemia, unspecified: Secondary | ICD-10-CM | POA: Diagnosis not present

## 2016-12-28 DIAGNOSIS — Z803 Family history of malignant neoplasm of breast: Secondary | ICD-10-CM | POA: Diagnosis not present

## 2016-12-28 LAB — CBC WITH DIFFERENTIAL/PLATELET
Basophils Absolute: 0 10*3/uL (ref 0–0.1)
Basophils Relative: 1 %
Eosinophils Absolute: 0.1 10*3/uL (ref 0–0.7)
Eosinophils Relative: 3 %
HCT: 25.2 % — ABNORMAL LOW (ref 35.0–47.0)
Hemoglobin: 8.2 g/dL — ABNORMAL LOW (ref 12.0–16.0)
Lymphocytes Relative: 25 %
Lymphs Abs: 1.2 10*3/uL (ref 1.0–3.6)
MCH: 29.1 pg (ref 26.0–34.0)
MCHC: 32.6 g/dL (ref 32.0–36.0)
MCV: 89.1 fL (ref 80.0–100.0)
Monocytes Absolute: 0.7 10*3/uL (ref 0.2–0.9)
Monocytes Relative: 14 %
Neutro Abs: 2.9 10*3/uL (ref 1.4–6.5)
Neutrophils Relative %: 57 %
Platelets: 292 10*3/uL (ref 150–440)
RBC: 2.83 MIL/uL — ABNORMAL LOW (ref 3.80–5.20)
RDW: 15 % — ABNORMAL HIGH (ref 11.5–14.5)
WBC: 5 10*3/uL (ref 3.6–11.0)

## 2016-12-28 LAB — COMPREHENSIVE METABOLIC PANEL
ALT: 11 U/L — ABNORMAL LOW (ref 14–54)
AST: 19 U/L (ref 15–41)
Albumin: 2.9 g/dL — ABNORMAL LOW (ref 3.5–5.0)
Alkaline Phosphatase: 67 U/L (ref 38–126)
Anion gap: 7 (ref 5–15)
BUN: 28 mg/dL — ABNORMAL HIGH (ref 6–20)
CO2: 26 mmol/L (ref 22–32)
Calcium: 9.4 mg/dL (ref 8.9–10.3)
Chloride: 103 mmol/L (ref 101–111)
Creatinine, Ser: 1.13 mg/dL — ABNORMAL HIGH (ref 0.44–1.00)
GFR calc Af Amer: 55 mL/min — ABNORMAL LOW (ref >60)
GFR calc non Af Amer: 47 mL/min — ABNORMAL LOW (ref >60)
Glucose, Bld: 116 mg/dL — ABNORMAL HIGH (ref 65–99)
Potassium: 4 mmol/L (ref 3.5–5.1)
Sodium: 136 mmol/L (ref 135–145)
Total Bilirubin: 0.4 mg/dL (ref 0.3–1.2)
Total Protein: 9.1 g/dL — ABNORMAL HIGH (ref 6.5–8.1)

## 2016-12-28 LAB — VITAMIN B12: Vitamin B-12: 154 pg/mL — ABNORMAL LOW (ref 180–914)

## 2016-12-28 NOTE — Progress Notes (Signed)
[facility] day:  12/28/2016   Chief Complaint: [name] is a 71 y.o. female with stage II multiple myeloma who is seen for 1 month assessment.  HPI: The patient was last seen in the medical oncology clinic on 11/14/2016.  At that time, she felt great.  She was seeing wound care for slowly healing lower leg wounds.  Creatinine was 1.31.  Hemoglobin was 8.2. Potassium was 5.3.  Folate was 24.  Iron saturation was 16% and TIBC 268.  Retic was 1.8%.  M spike was 1.1 gm/dl.  Free light chain ratio was 6.74.  In the interim, patient has been doing well. She denies any acute physical concerns today. Patient notes that her wounds to her bilateral lower extremities have improved. She is now having silver topical cream applied. She states, "that new cream and the new triple wrappings have really helped". Patient now has home health nursing services twice a week for wound care [facility]).   She denies any B symptoms or interval infections. She is eating well, with no significant weight loss demonstrated.    Past Medical History:  Diagnosis Date  . Aortic stenosis   . Asthma   . Coronary artery disease   . Diabetes mellitus without complication ([place])   . History of measles   . Hypertension   . Nonischemic cardiomyopathy ([place])   . Pulmonary hypertension ([place])     Past Surgical History:  Procedure Laterality Date  . Cyst(solitary) of breast:removed    . RIGHT/LEFT HEART CATH AND CORONARY ANGIOGRAPHY N/A 03/07/2016   Procedure: Right/Left Heart Cath and Coronary Angiography;  Surgeon: Nelva Bush, MD;  Location: [facility];  Service: Cardiovascular;  Laterality: N/A;  . TUBAL LIGATION      Family History  Problem Relation Age of Onset  . Cancer Mother        Uterine cancer  . Diabetes Mother   . Heart disease Mother   . Heart failure Mother   . Parkinson's disease Father   . Cancer Sister        breast cancer  . Diabetes  Brother        Non-insulin Dependent Diabetes Mellitus  . Mesothelioma Brother   . Hypertension Sister   . Diabetes Sister        Non-insulin dependent Diabetes Mellitus    Social History:  reports that  has never smoked. she has never used smokeless tobacco. She reports that she does not drink alcohol or use drugs.  She is originally from [place] ([place]  Her husband was born in [place], [place].  They met in middle school in [place].  She has been married to her husband x 52 years.  The patient is accompanied by her husband, [name], today.  Allergies:  Allergies  Allergen Reactions  . Hydrochlorothiazide     Leg Cramps  . Penicillins Rash    Current Medications: Current Outpatient Medications  Medication Sig Dispense Refill  . amLODipine (NORVASC) 5 MG tablet TAKE 1 TABLET (5 MG TOTAL) BY MOUTH DAILY. 30 tablet 5  . aspirin EC 81 MG tablet Take 1 tablet (81 mg total) by mouth daily. 90 tablet 3  . furosemide (LASIX) 40 MG tablet Take 1 tablet (40 mg total) by mouth daily. 90 tablet 3  . lisinopril (PRINIVIL,ZESTRIL) 20 MG tablet Take 1 tablet (20 mg total) daily by mouth. 90 tablet 3  . lovastatin (MEVACOR) 20 MG tablet TAKE  1 TABLET (20 MG TOTAL) BY MOUTH AT BEDTIME. 90 tablet 4  . metFORMIN (GLUCOPHAGE-XR) 750 MG 24 hr tablet Take 2 tablets by mouth daily.  4  . metoprolol (LOPRESSOR) 50 MG tablet Take 1 tablet (50 mg total) by mouth 2 (two) times daily. 180 tablet 3  . silver sulfADIAZINE (SILVADENE) 1 % cream APPLY TO AFFECTED AREA DAILY 85 g 5  . spironolactone (ALDACTONE) 25 MG tablet TAKE 1 TABLET (25 MG TOTAL) BY MOUTH DAILY. 30 tablet 12  . traZODone (DESYREL) 100 MG tablet TAKE 1 TABLET BY MOUTH EVERY NIGHT AT BEDTIME AS NEEDED FOR INSOMNIA 90 tablet 4  . fluticasone furoate-vilanterol (BREO ELLIPTA) 200-25 MCG/INH AEPB Inhale 1 puff into the lungs daily. FOR ASTHMA J45.20 (Patient not taking: Reported on 12/28/2016) 1 each 5  . HYDROcodone-acetaminophen (NORCO)  10-325 MG tablet Take 1 tablet by mouth every 8 (eight) hours as needed. (Patient not taking: Reported on 12/28/2016) 90 tablet 0  . meloxicam (MOBIC) 15 MG tablet Take 1 tablet (15 mg total) by mouth daily as needed for pain. Take with food (Patient not taking: Reported on 12/28/2016) 90 tablet 4   No current facility-administered medications for this visit.     Review of Systems:  GENERAL:  "I'm fine".  No fevers or sweats.  Weight down 3 pounds since last visit. PERFORMANCE STATUS (ECOG):  1 HEENT:  Chronic vision changes.  No visual changes, runny nose, sore throat, mouth sores or tenderness. Lungs:  Shortness of breath.  No cough.  No hemoptysis. Cardiac:  No chest pain, palpitations, orthopnea, or PND. GI:  Eating well.  No nausea, vomiting, diarrhea, constipation, melena or hematochezia.  Colonoscopy up-to-date. GU:  No urgency, dysuria, or hematuria. Musculoskeletal:  Lower back pain.  No joint pain.  No muscle tenderness. Extremities:  Lower extremity swelling, improved (see HPI).  Legs and feet hurt (chronic).   Skin:  Healing leg wounds.  Right leg "fantastic".  Left leg "better".  No other rashes or skin changes. Neuro:  No headache, numbness or weakness, balance or coordination issues. Endocrine:  Diabetes.  No thyroid issues, hot flashes or night sweats. Psych:  Anxiety.  No mood changes, depression. Pain:  Chronic leg pain (5 out of 10).  Review of systems:  All other systems reviewed and found to be negative.  Physical Exam: Blood pressure 134/81, pulse 66, temperature (!) 97.4 F (36.3 C), temperature source Tympanic, resp. rate 20, weight 200 lb 12.8 oz (91.1 kg). GENERAL:  Well developed, well nourished woman sitting in a wheelchair in the exam room in no acute distress.  MENTAL STATUS:  Alert and oriented to person, place and time. HEAD:  Pearline Cables styled hair.  Normocephalic, atraumatic, face symmetric, no Cushingoid features. EYES:Glasses. Blue eyes.  Pupils equal  round and reactive to light and accomodation. No conjunctivitis or scleral icterus. WUJ:[id number] clear without lesion. Tonguenormal. Mucous membranes moist. RESPIRATORY:Clear to auscultationwithout rales, wheezes or rhonchi. CARDIOVASCULAR:Regular rate andrhythmwithout murmur, rub or gallop. ABDOMEN:Soft, non-tender, with active bowel sounds, and no hepatosplenomegaly. No masses. SKIN: Pale.  No rashes, ulcers or lesions. EXTREMITIES: Chronic lower extremity edema. Legs wrapped.  Right lower extremity < left lower extremity (edema).  No skin discoloration or tenderness. No palpable cords. LYMPHNODES: No palpable cervical, supraclavicular, axillary or inguinal adenopathy  NEUROLOGICAL: Unremarkable. PSYCH: Appropriate.   Appointment on 12/28/2016  Component Date Value Ref Range Status  . Sodium 12/28/2016 136  135 - 145 mmol/L Final  . Potassium 12/28/2016 4.0  3.5 -  5.1 mmol/L Final  . Chloride 12/28/2016 103  101 - 111 mmol/L Final  . CO2 12/28/2016 26  22 - 32 mmol/L Final  . Glucose, Bld 12/28/2016 116* 65 - 99 mg/dL Final  . BUN 12/28/2016 28* 6 - 20 mg/dL Final  . Creatinine, Ser 12/28/2016 1.13* 0.44 - 1.00 mg/dL Final  . Calcium 12/28/2016 9.4  8.9 - 10.3 mg/dL Final  . Total Protein 12/28/2016 9.1* 6.5 - 8.1 g/dL Final  . Albumin 12/28/2016 2.9* 3.5 - 5.0 g/dL Final  . AST 12/28/2016 19  15 - 41 U/L Final  . ALT 12/28/2016 11* 14 - 54 U/L Final  . Alkaline Phosphatase 12/28/2016 67  38 - 126 U/L Final  . Total Bilirubin 12/28/2016 0.4  0.3 - 1.2 mg/dL Final  . GFR calc non Af Amer 12/28/2016 47* >60 mL/min Final  . GFR calc Af Amer 12/28/2016 55* >60 mL/min Final   Comment: (NOTE) The eGFR has been calculated using the CKD EPI equation. This calculation has not been validated in all clinical situations. eGFR's persistently <60 mL/min signify possible Chronic Kidney Disease.   Georgiann Hahn gap 12/28/2016 7  5 - 15 Final   Performed at [facility],  [address] [place], [place] [address]  . WBC 12/28/2016 5.0  3.6 - 11.0 K/uL Final  . RBC 12/28/2016 2.83* 3.80 - 5.20 MIL/uL Final  . Hemoglobin 12/28/2016 8.2* 12.0 - 16.0 g/dL Final  . HCT 12/28/2016 25.2* 35.0 - 47.0 % Final  . MCV 12/28/2016 89.1  80.0 - 100.0 fL Final  . MCH 12/28/2016 29.1  26.0 - 34.0 pg Final  . MCHC 12/28/2016 32.6  32.0 - 36.0 g/dL Final  . RDW 12/28/2016 15.0* 11.5 - 14.5 % Final  . Platelets 12/28/2016 292  150 - 440 K/uL Final  . Neutrophils Relative % 12/28/2016 57  % Final  . Neutro Abs 12/28/2016 2.9  1.4 - 6.5 K/uL Final  . Lymphocytes Relative 12/28/2016 25  % Final  . Lymphs Abs 12/28/2016 1.2  1.0 - 3.6 K/uL Final  . Monocytes Relative 12/28/2016 14  % Final  . Monocytes Absolute 12/28/2016 0.7  0.2 - 0.9 K/uL Final  . Eosinophils Relative 12/28/2016 3  % Final  . Eosinophils Absolute 12/28/2016 0.1  0 - 0.7 K/uL Final  . Basophils Relative 12/28/2016 1  % Final  . Basophils Absolute 12/28/2016 0.0  0 - 0.1 K/uL Final   Performed at [facility], [address] [place], [place] [address]    Assessment:  [name] is a 71 y.o. female with stage II myeloma.  SPEP on 01/25/2016 revealed a 0.9 gm/dL IgG monoclonal gammopathy.  She denied any bone pain or recurrent infections.  Calcium and creatinine were normal.  Urinalysis revealed 30+ protein.  Work-up on 02/14/2016 revealed a hematocrit of 29.1, hemoglobin 9.4, MCV 84.6, platelets 225,000, white count 5700 with an [place] of 3700.  SPEP revealed a 1.9 g/dL monoclonal spike.  Kappa free light chains were 237.2, lambda free light chains 38.7 with a ratio of 6.13 (0.26-1.65).  Beta 2-microglobulin was 3.9 (0.6-2.4).  BMP revealed a creatinine of 0.95 and calcium 9.0.  24 hour UPEP revealed 31 mg/24 hours of an IgG monoclonal protein with kappa light chain specificity.  Bence Jones protein was positive, kappa type.  Bone marrow on 02/29/2016 revealed a hypercellular marrow for age with  plasmacytosis (plasma cells 10%).  There was trilineage hematopoiesis. Immunohistochemical stains highlighted the plasma cell component  which generally showed a polyclonal staining pattern for kappa and lambda light chain although there was kappa light chain excess especially in several plasma cell clusters. The features were most suggestive of early plasma cell dyscrasia/neoplasm.  Cytogenetics were normal (39, XX).  Bone survey on 03/22/2016 revealed no focal lesion or acute bony abnormality.  PET scan on 03/31/2016 revealed no significant abnormal hypermetabolic activity in the head, neck, chest, abdomen, pelvis, or skeleton.  SPEP has been followed: 0.9 gm/dL on 01/25/2016, 1.9 on 02/14/2016, 1.7 on 03/24/2016, 0.7 on 05/16/2016, 0.8 on 07/14/2016, 1.6 on 09/14/2016, and 1.1 on 11/14/2016.   She has a normocytic anemia.  Work-up on 01/25/2016 and 02/14/2016 revealed a normal ferritin (90), iron studies, folate, and thyroid function tests.  B12 was 312 (low normal) on 01/25/2016, but with a normal MMA on 02/14/2016 thus ruling out B12 deficiency.  Reticulocyte count is inappropiately low.  Last colonoscopy was 2 years ago.  Guaiac cards were negative x 3 in 04/2016.  Cardiac catheterization on 03/07/2016 revealed non-obstructive coronary artery disease.  She has severe pulmonary artery hypertension with moderate to severely elevated right heart filling pressures and moderately elevated left heart filling pressures.   Symptomatically, she feels fine.  She has slowly healing lower leg wounds. She has home health nursing services.  Creatinine is 1.13.  Hemoglobin is 8.2. Potassium 4.0.  Plan: 1.  Labs today:  CBC with diff, CMP, B12, immunoglobulin levels. 2.  24 hour urine for UPEP and free light chains. 3.  Discuss anemia. Plan for ongoing close surveillance.  Review indications for treatment of both anemia and myeloma. 4.  RTC in 2 months for MD assessment and labs (CBC with diff, CMP).  Anticipate changing to every 3 months at next visit if stable.    Honor Loh, NP  12/28/2016, 10:58 AM   I saw and evaluated the patient, participating in the key portions of the service and reviewing pertinent diagnostic studies and records.  I reviewed the nurse practitioner's note and agree with the findings and the plan.  The assessment and plan were discussed with the patient.  Several questions were asked by the patient and answered.   Nolon Stalls, MD 12/28/2016,10:58 AM

## 2016-12-28 NOTE — Progress Notes (Signed)
Here for follow up. Going to wound clinic tomorrow and vascular Dr in January. Doing well over all

## 2016-12-29 ENCOUNTER — Telehealth: Payer: Self-pay | Admitting: *Deleted

## 2016-12-29 ENCOUNTER — Encounter: Payer: Medicare Other | Admitting: Surgery

## 2016-12-29 DIAGNOSIS — M1990 Unspecified osteoarthritis, unspecified site: Secondary | ICD-10-CM | POA: Diagnosis not present

## 2016-12-29 DIAGNOSIS — I272 Pulmonary hypertension, unspecified: Secondary | ICD-10-CM | POA: Diagnosis not present

## 2016-12-29 DIAGNOSIS — Z7984 Long term (current) use of oral hypoglycemic drugs: Secondary | ICD-10-CM | POA: Diagnosis not present

## 2016-12-29 DIAGNOSIS — J45909 Unspecified asthma, uncomplicated: Secondary | ICD-10-CM | POA: Diagnosis not present

## 2016-12-29 DIAGNOSIS — I110 Hypertensive heart disease with heart failure: Secondary | ICD-10-CM | POA: Diagnosis not present

## 2016-12-29 DIAGNOSIS — I890 Lymphedema, not elsewhere classified: Secondary | ICD-10-CM | POA: Diagnosis not present

## 2016-12-29 DIAGNOSIS — L97822 Non-pressure chronic ulcer of other part of left lower leg with fat layer exposed: Secondary | ICD-10-CM | POA: Diagnosis not present

## 2016-12-29 DIAGNOSIS — L97222 Non-pressure chronic ulcer of left calf with fat layer exposed: Secondary | ICD-10-CM | POA: Diagnosis not present

## 2016-12-29 DIAGNOSIS — I429 Cardiomyopathy, unspecified: Secondary | ICD-10-CM | POA: Diagnosis not present

## 2016-12-29 DIAGNOSIS — E11622 Type 2 diabetes mellitus with other skin ulcer: Secondary | ICD-10-CM | POA: Diagnosis not present

## 2016-12-29 DIAGNOSIS — I509 Heart failure, unspecified: Secondary | ICD-10-CM | POA: Diagnosis not present

## 2016-12-29 DIAGNOSIS — I2510 Atherosclerotic heart disease of native coronary artery without angina pectoris: Secondary | ICD-10-CM | POA: Diagnosis not present

## 2016-12-29 DIAGNOSIS — L97819 Non-pressure chronic ulcer of other part of right lower leg with unspecified severity: Secondary | ICD-10-CM | POA: Diagnosis not present

## 2016-12-29 DIAGNOSIS — E785 Hyperlipidemia, unspecified: Secondary | ICD-10-CM | POA: Diagnosis not present

## 2016-12-29 DIAGNOSIS — L97212 Non-pressure chronic ulcer of right calf with fat layer exposed: Secondary | ICD-10-CM | POA: Diagnosis not present

## 2016-12-29 LAB — IGG, IGA, IGM
IgA: 235 mg/dL (ref 64–422)
IgG (Immunoglobin G), Serum: 3434 mg/dL — ABNORMAL HIGH (ref 700–1600)
IgM (Immunoglobulin M), Srm: 29 mg/dL (ref 26–217)

## 2016-12-29 NOTE — Progress Notes (Addendum)
BULAR, HICKOK ([id number]) Visit Report for 12/29/2016 Chief Complaint Document Details Patient Name: [name], [name]. Date of Service: 12/29/2016 8:45 AM Medical Record Number: [id number] Patient Account Number: [id number] Date of Birth/Sex: [birth year] (71 y.o. Female) Treating RN: Cornell Barman Primary Care Provider: Lelon Huh Other Clinician: Referring Provider: Lelon Huh Treating Provider/Extender: Frann Rider in Treatment: 12 Information Obtained from: Patient Chief Complaint Patient seen for complaints of Non-Healing Wounds to both lower extremities which she's had for about 2 months with swelling. Electronic Signature(s) Signed: 12/29/2016 9:57:32 AM By: Christin Fudge MD, FACS Entered By: Christin Fudge on 12/29/2016 09:57:32 [name] ([id number]) -------------------------------------------------------------------------------- HPI Details Patient Name: [name], Jillian B. Date of Service: 12/29/2016 8:45 AM Medical Record Number: [id number] Patient Account Number: [id number] Date of Birth/Sex: 1945-04-23 (71 y.o. Female) Treating RN: Cornell Barman Primary Care Provider: Lelon Huh Other Clinician: Referring Provider: Lelon Huh Treating Provider/Extender: Frann Rider in Treatment: 12 History of Present Illness Location: both lower extremity swelling with ulceration Quality: Patient reports experiencing a sharp pain to affected area(s). Severity: Patient states wound are getting worse. Duration: Patient has had the wound for > 2 months prior to seeking treatment at the wound center Timing: Pain in wound is constant (hurts all the time) and is worse when she elevates her legs Context: The wound would happen gradually Modifying Factors: Other treatment(s) tried include:she has been on ciprofloxacin and has been applying Silvadene ointment Associated Signs and Symptoms: Patient reports having increase swelling. HPI Description: 71 year old patient  who sees her PCP Dr. Lelon Huh was recently evaluated 10 days ago for diabetes mellitus, hypertension, CHF and hyperlipidemia. she also was noted to have ulcerations develop in her legs and she has been applying Silvadene dressings locally. In the past she has refused wound care referrals.her cardiologist Dr. Saunders Revel saw her and put her on 40 mg of furosemide daily. last hemoglobin A1c was 7.7%. she was also placed on ciprofloxacin twice daily for 7 days and a urine culture was recommended. past medical history significant for coronary artery disease, diabetes mellitus, nonischemic cardiomyopathy and pulmonary hypertension. She is also status post heart catheterization and coronary angiography, tubal ligation and breast cyst removal in the past. She has never been a smoker. Patient had arterial studies done which showed bilateral ABIs are artificially elevated due to noncompressible and calcified vessels. Triphasic waveform throughout. Right great toe TBI is elevated while the left is normal. 10/23/2016 -- the patient is rather moribund from several issues including chronic back pain and knee pain and swelling of her legs. The large necrotic area on her left lateral anterior calf was bleeding on touch after washing her leg. There was a spot which needed silver nitrate cauterization and this was done appropriately. 11/06/2016 -- she is again noted to have friable bleeding from the left lower extremity wounds and this again had to be cauterized with silver nitrate to control the bleeding as pressure itself would not do it. 11/20/2016 -- the right lower extremity is completely healed and we have ordered 30-40 mm compression stocking's in both the dural layer and also a pair of juxta lites. 12/18/2016 -- she has not been wearing her compression stockings on her right leg and these have opened out into ulcerations again. Her left lower extremity has circumferential ulcerations now. I believe at this  stage I would like to get her venous reflux studies done to make certain that there is no fixable superficial venous reflux. 12/29/2016 -- she has made an  excellent recovery having continued with her appropriate doses of diuretics and elevation and exercise. She has not yet received her right lower extremity juxta lites. Her venous duplex study is at the end of January Electronic Signature(s) Signed: 12/29/2016 9:58:09 AM By: Christin Fudge MD, FACS Entered By: Christin Fudge on 12/29/2016 09:58:09 [name] ([id number]) -------------------------------------------------------------------------------- Physical Exam Details Patient Name: [name], Jillian B. Date of Service: 12/29/2016 8:45 AM Medical Record Number: [id number] Patient Account Number: [id number] Date of Birth/Sex: 1945/05/13 (71 y.o. Female) Treating RN: Cornell Barman Primary Care Provider: Lelon Huh Other Clinician: Referring Provider: Lelon Huh Treating Provider/Extender: Frann Rider in Treatment: 12 Constitutional . Pulse regular. Respirations normal and unlabored. Afebrile. . Eyes Nonicteric. Reactive to light. Ears, Nose, Mouth, and Throat Lips, teeth, and gums WNL.Marland Kitchen Moist mucosa without lesions. Neck supple and nontender. No palpable supraclavicular or cervical adenopathy. Normal sized without goiter. Respiratory WNL. No retractions.. Cardiovascular Pedal Pulses WNL. No clubbing, cyanosis or edema. Lymphatic No adneopathy. No adenopathy. No adenopathy. Musculoskeletal Adexa without tenderness or enlargement.. Digits and nails w/o clubbing, cyanosis, infection, petechiae, ischemia, or inflammatory conditions.. Integumentary (Hair, Skin) No suspicious lesions. No crepitus or fluctuance. No peri-wound warmth or erythema. No masses.Marland Kitchen Psychiatric Judgement and insight Intact.. No evidence of depression, anxiety, or agitation.. Notes the lymphedema is very well controlled bilaterally and the right leg  is completely healed. The left leg looks very good today with the ulceration having minimal debris which was washed out with moist saline gauze and no debridement was required Electronic Signature(s) Signed: 12/29/2016 9:58:56 AM By: Christin Fudge MD, FACS Entered By: Christin Fudge on 12/29/2016 09:58:56 [name] ([id number]) -------------------------------------------------------------------------------- Physician Orders Details Patient Name: Jillian Albe B. Date of Service: 12/29/2016 8:45 AM Medical Record Number: [id number] Patient Account Number: [id number] Date of Birth/Sex: 10-27-1945 (71 y.o. 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 o Cleanse wound with mild soap and water o May Shower, gently pat wound dry prior to applying new dressing. Anesthetic (add to Medication List) Wound #1 Left,Circumferential Lower Leg o Topical Lidocaine 4% cream applied to wound bed prior to debridement (In Clinic Only). Primary Wound Dressing Wound #1 Left,Circumferential Lower Leg o Santyl Ointment Secondary Dressing Wound #1 Left,Circumferential Lower Leg o ABD pad o XtraSorb Dressing Change Frequency Wound #1 Left,Circumferential Lower Leg o Change Dressing Monday, Wednesday, Friday - HHRN to change Monday, Wednesday and Fridays and pt to come to [place] on Fridays. Follow-up Appointments o Return  Appointment in 1 week. Edema Control Wound #1 Left,Circumferential Lower Leg o 3 Layer Compression System - Bilateral - unna to anchor o Patient to wear own compression stockings - Right leg-patient can transition to compressing stockings once the get them from [facility]. o Elevate legs to the level of the heart and pump ankles as often as possible Kulick, Glennie B. ([id number]) Additional Orders / Instructions Wound #1 Left,Circumferential Lower Leg o Vitamin A; Vitamin C, Zinc o Increase protein intake. Home Health Wound #1 Left,Circumferential Lower Leg o Continue Home Health Visits - follow-up on ordering  compression stockings o Home Health Nurse may visit PRN to address patientos wound care needs. o FACE TO FACE ENCOUNTER: MEDICARE and MEDICAID PATIENTS: I certify that this patient is under my care and that I had a face-to-face encounter that meets the physician face-to-face encounter requirements with this patient on this date. The encounter with the patient was in whole or in part for the following MEDICAL CONDITION: (primary reason for [place]) MEDICAL NECESSITY: I certify, that based on my findings, NURSING services are a medically necessary home health service. HOME BOUND STATUS: I certify that my clinical findings support that this patient is homebound (i.e., Due to illness or injury, pt requires aid of supportive devices such as crutches, cane, wheelchairs, walkers, the use of special transportation or the assistance of another person to leave their place of residence. There is a normal inability to leave the home and doing so requires considerable and taxing effort. Other absences are for medical reasons / religious services and are infrequent or of short duration when for other reasons). o If current dressing causes regression in wound condition, may D/C ordered dressing product/s and apply Normal Saline Moist Dressing daily until next [place] / Other MD appointment. [place] of regression in wound condition at [phone]. o Please direct any NON-WOUND related issues/requests for orders to patient's Primary Care Physician Electronic Signature(s) Signed: 12/29/2016 1:42:42 PM By: Christin Fudge MD, FACS Signed: 12/29/2016 5:14:22 PM By: Gretta Cool, BSN, RN, CWS, Kim RN, BSN Entered By: Gretta Cool, BSN, RN, CWS, Kim on 12/29/2016 10:49:45 [name] ([id number]) -------------------------------------------------------------------------------- Problem List Details Patient Name: [name], [name]. Date of Service: 12/29/2016 8:45 AM Medical Record Number: [id number] Patient Account Number: [id number] Date of Birth/Sex: 10-Dec-1945 (71 y.o. 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 Date of Service: 12/29/2016 8:45 AM Medical Record Number: [id number] Patient Account Number: [id number] Date of Birth/Sex: 1945-03-13 (71 y.o. Female) Treating RN: Cornell Barman Primary Care Provider: Lelon Huh Other Clinician: Referring Provider: Lelon Huh Treating Provider/Extender: Frann Rider in Treatment: 12 Subjective Chief Complaint Information obtained from Patient Patient seen for complaints of Non-Healing Wounds to both lower extremities which she's had for about 2 months with swelling. History of Present Illness (HPI) The following HPI elements were documented for the patient's wound: Location: both lower extremity swelling with ulceration Quality: Patient reports experiencing a sharp pain to affected area(s). Severity: Patient states wound are getting worse. Duration: Patient has had the wound for > 2 months prior to seeking treatment at the wound center Timing: Pain in wound is constant (hurts all the time) and is worse when she elevates her legs Context: The wound would happen gradually Modifying Factors: Other treatment(s) tried include:she has been on ciprofloxacin and has been applying Silvadene ointment Associated Signs and Symptoms: Patient reports having increase swelling. 71 year old patient who sees her PCP Dr. Lelon Huh  was recently evaluated 10 days ago for diabetes mellitus, hypertension, CHF and hyperlipidemia. she also was noted to have ulcerations develop in her legs and she has been applying Silvadene dressings locally. In the past she has refused wound care referrals.her cardiologist Dr. Saunders Revel saw her and put her on 40 mg of furosemide daily. last hemoglobin A1c was 7.7%. she was also placed on ciprofloxacin twice daily for 7 days and a urine culture was recommended. past medical history significant for coronary artery disease, diabetes mellitus, nonischemic cardiomyopathy and pulmonary hypertension. She is also status post heart catheterization and coronary angiography, tubal ligation and breast cyst removal in the past. She has never been a smoker. Patient had arterial studies done which showed bilateral ABIs are artificially elevated due to noncompressible and calcified vessels. Triphasic waveform throughout. Right great toe TBI is  elevated while the left is normal. 10/23/2016 -- the patient is rather moribund from several issues including chronic back pain and knee pain and swelling of her legs. The large necrotic area on her left lateral anterior calf was bleeding on touch after washing her leg. There was a spot which needed silver nitrate cauterization and this was done appropriately. 11/06/2016 -- she is again noted to have friable bleeding from the left lower extremity wounds and this again had to be cauterized with silver nitrate to control the bleeding as pressure itself would not do it. 11/20/2016 -- the right lower extremity is completely healed and we have ordered 30-40 mm compression stocking's in both the dural layer and also a pair of juxta lites. 12/18/2016 -- she has not been wearing her compression stockings on her right leg and these have opened out into ulcerations again. Her left lower extremity has circumferential ulcerations now. I believe at this stage I would like to get her venous reflux studies done to make certain that there is no fixable superficial venous reflux. 12/29/2016 -- she has made an excellent recovery having continued with her appropriate doses of diuretics and elevation and exercise. She has not yet received her right lower extremity juxta lites. Her venous duplex study is at the end of January Patient History [name], [name] ([id number]) Information obtained from Patient. Family History Cancer - Mother,Siblings, Diabetes - Mother,Siblings, Heart Disease - Mother, Hypertension - Siblings, No family history of Hereditary Spherocytosis, Kidney Disease, Lung Disease, Seizures, Stroke, Thyroid Problems, Tuberculosis. Social History Never smoker, Marital Status - Married, Alcohol Use - Never, Drug Use - No History, Caffeine Use - Daily. Objective Constitutional Pulse regular. Respirations normal and unlabored. Afebrile. Vitals Time Taken: 9:11 AM, Height: 64 in, Weight: 209 lbs, BMI:  35.9, Temperature: 97.5 F, Pulse: 59 bpm, Respiratory Rate: 16 breaths/min, Blood Pressure: 111/59 mmHg. Eyes Nonicteric. Reactive to light. Ears, Nose, Mouth, and Throat Lips, teeth, and gums WNL.Marland Kitchen Moist mucosa without lesions. Neck supple and nontender. No palpable supraclavicular or cervical adenopathy. Normal sized without goiter. Respiratory WNL. No retractions.. Cardiovascular Pedal Pulses WNL. No clubbing, cyanosis or edema. Lymphatic No adneopathy. No adenopathy. No adenopathy. Musculoskeletal Adexa without tenderness or enlargement.. Digits and nails w/o clubbing, cyanosis, infection, petechiae, ischemia, or inflammatory conditions.Marland Kitchen Psychiatric Judgement and insight Intact.. No evidence of depression, anxiety, or agitation.. General Notes: the lymphedema is very well controlled bilaterally and the right leg is completely healed. The left leg looks very good today with the ulceration having minimal debris which was washed out with moist saline gauze and no debridement was required Integumentary (Hair, Skin) [name], Jillian B. ([id number]) No suspicious lesions.  No crepitus or fluctuance. No peri-wound warmth or erythema. No masses.. Wound #1 status is Open. Original cause of wound was Gradually Appeared. The wound is located on the Left,Circumferential Lower Leg. The wound measures 7.5cm length x 20cm width x 0.3cm depth; 117.81cm^2 area and 35.343cm^3 volume. There is Fat Layer (Subcutaneous Tissue) Exposed exposed. There is no tunneling or undermining noted. There is a large amount of serosanguineous drainage noted. The wound margin is distinct with the outline attached to the wound base. There is small (1-33%) red, hyper - granulation within the wound bed. There is a large (67-100%) amount of necrotic tissue within the wound bed including Eschar and Adherent Slough. The periwound skin appearance exhibited: Excoriation, Maceration, Erythema. The surrounding wound skin color is  noted with erythema which is circumferential. Periwound temperature was noted as No Abnormality. Wound #8 status is Healed - Epithelialized. Original cause of wound was Gradually Appeared. The wound is located on the Right,Proximal Lower Leg. The wound measures 0cm length x 0cm width x 0cm depth; 0cm^2 area and 0cm^3 volume. Wound #9 status is Healed - Epithelialized. Original cause of wound was Gradually Appeared. The wound is located on the Right,Distal Lower Leg. The wound measures 0cm length x 0cm width x 0cm depth; 0cm^2 area and 0cm^3 volume. 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 There was a Three Layer Compression Therapy Procedure by Montey Hora, RN. Post procedure Diagnosis Wound #1: Same as Pre-Procedure Plan Wound Cleansing: Wound #1 Left,Circumferential Lower Leg: Clean wound with Normal Saline. Cleanse wound with mild soap and water May Shower, gently pat wound dry prior to applying new dressing. Anesthetic (add to Medication List): Wound #1 Left,Circumferential Lower Leg: Topical Lidocaine 4% cream applied to wound bed prior to debridement (In Clinic Only). Primary Wound Dressing: [name], [name] ([id number]) Wound #1 Left,Circumferential Lower Leg: Santyl Ointment Secondary Dressing: Wound #1 Left,Circumferential Lower Leg: ABD pad XtraSorb Dressing Change Frequency: Wound #1 Left,Circumferential Lower Leg: Change Dressing Monday, Wednesday, Friday - HHRN to change Monday, Wednesday and Fridays and pt to come to [place] on Fridays. Follow-up  Appointments: Return Appointment in 1 week. Edema Control: Wound #1 Left,Circumferential Lower Leg: 3 Layer Compression System - Bilateral - unna to anchor Patient to wear own compression stockings - Right leg-patient can transition to compressing stockings once the get them from [facility]. Elevate legs to the level of the heart and pump ankles as often as possible Additional Orders / Instructions: Wound #1 Left,Circumferential Lower Leg: Vitamin A; Vitamin C, Zinc Increase protein intake. Home Health: Wound #1 Left,Circumferential Lower Leg: Continue Home Health Visits - follow-up on ordering compression stockings Home Health Nurse may visit PRN to address patient s wound care needs. FACE TO FACE ENCOUNTER: MEDICARE and MEDICAID PATIENTS: I certify that this patient is under my care and that I had a face-to-face encounter that meets the physician face-to-face encounter requirements with this patient on this date. The encounter with the patient was in whole or in part for the following MEDICAL CONDITION: (primary reason for [place]) MEDICAL NECESSITY: I certify, that based on my findings, NURSING services are a medically necessary home health  service. HOME BOUND STATUS: I certify that my clinical findings support that this patient is homebound (i.e., Due to illness or injury, pt requires aid of supportive devices such as crutches, cane, wheelchairs, walkers, the use of special transportation or the assistance of another person to leave their place of residence. There is a normal inability to leave the home and doing so requires considerable and taxing effort. Other absences are for medical reasons / religious services and are infrequent or of short duration when for other reasons). If current dressing causes regression in wound condition, may D/C ordered dressing product/s and apply Normal Saline Moist Dressing daily until next [place] / Other MD appointment. [place] of regression in wound condition at [phone]. Please direct any NON-WOUND related issues/requests for orders to patient's Primary Care Physician she has done very well over the last couple of weeks and I'm very pleased with her progress. I have encouraged her to keep up the good work. After review today, I have recommended: 1. Santyl ointment to the left lower extremity to be wrapped with 3 layer Profore to be changed 3 times a week. 2. we have ordered her compression stockings for the right lower extremity both the juxta light and a dual pair of 30-40 mm compression stockings -- she is to bring it along with her the next time she is here 3. Elevation and exercise discussed with her in great detail 4. venous reflux studies bilaterally -- appointment at the end of January 5. her husband who is the caregiver and is at the bedside, has had several questions which I have answered to his satisfaction. 6. Regular visits to the wound center. Electronic Signature(s) [name], [name] ([id number]) Signed: 12/29/2016 1:44:44 PM By: Christin Fudge MD, FACS Previous Signature: 12/29/2016 10:00:36 AM Version By: Christin Fudge MD, FACS Entered By: Christin Fudge on 12/29/2016 13:44:44 [name] ([id number]) -------------------------------------------------------------------------------- ROS/PFSH Details Patient Name: [name], Jillian B. Date of Service: 12/29/2016 8:45 AM Medical Record Number: [id number] Patient Account Number: [id number] Date of Birth/Sex: Dec 23, 1945 (71 y.o. Female) Treating RN: Cornell Barman Primary Care Provider: Lelon Huh Other Clinician: Referring Provider: Lelon Huh Treating Provider/Extender: Frann Rider in Treatment: 12 Information Obtained From Patient Wound History Do you currently have one or more open woundso Yes How many open wounds do you currently haveo 6 Approximately how long have you had your woundso 2 months How have you  been treating your wound(s) until nowo silvadene Has your wound(s) ever healed and then re-openedo No Have you had any lab work done in the past montho No Have you tested positive for an antibiotic resistant organism (MRSA, VRE)o No Have you tested positive for osteomyelitis (bone infection)o No Have you had any tests for circulation on your legso Yes Where was the test doneo [facility] Have you had other problems associated with your woundso Infection, Swelling Respiratory Medical History: Positive for: Asthma Cardiovascular Medical History: Positive for: Congestive Heart Failure; Coronary Artery Disease; Hypertension Endocrine Medical History: Positive for: Type II Diabetes Time with diabetes: a long time Treated with: Oral agents Blood sugar tested every day: No Musculoskeletal Medical History: Positive for: Osteoarthritis Immunizations Pneumococcal Vaccine: Received Pneumococcal Vaccination: Yes Implantable Devices Family and Social History Cancer: Yes - Mother,Siblings; Diabetes: Yes - Mother,Siblings; Heart Disease: Yes - Mother; Hereditary Spherocytosis: No; Hypertension: Yes - Siblings; Kidney Disease: No; Lung Disease: No; Seizures: No; Stroke: No; Thyroid Problems: No; LAKINDRA, WIBLE B. ([id number]) Tuberculosis: No; Never smoker; Marital Status -  Married; Alcohol Use: Never; Drug Use: No History; Caffeine Use: Daily; Financial Concerns: No; Food, Clothing or Shelter Needs: No; Support System Lacking: No; Transportation Concerns: No; Advanced Directives: No; Patient does not want information on Advanced Directives; Do not resuscitate: No; Living Will: No; Medical Power of Attorney: No Physician Affirmation I have reviewed and agree with the above information. Electronic Signature(s) Signed: 12/29/2016 1:42:42 PM By: Christin Fudge MD, FACS Signed: 12/29/2016 5:14:22 PM By: Gretta Cool BSN, RN, CWS, Kim RN, BSN Entered By: Christin Fudge on 12/29/2016 09:58:17 AIDAH, FORQUER  ([id number]) -------------------------------------------------------------------------------- [place] Details Patient Name: YAMILETH, HAYSE. Date of Service: 12/29/2016 Medical Record Number: [id number] Patient Account Number: [id number] Date of Birth/Sex: 07-15-1945 (71 y.o. 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 Physician Procedures CPT4 Code: 9201007 Description: 12197 - WC PHYS LEVEL 3 - EST PT ICD-10 Diagnosis Description E11.622 Type 2 diabetes mellitus with other skin ulcer I89.0 Lymphedema, not elsewhere classified L97.212 Non-pressure chronic ulcer of right calf with fat layer exp L97.222  Non-pressure chronic ulcer of left calf with fat layer expo Modifier: osed sed Quantity: 1 Electronic Signature(s) Signed: 12/29/2016 1:27:44 PM By: Gretta Cool, BSN, RN, CWS, Kim RN, BSN Signed: 12/29/2016 1:42:42 PM By: Christin Fudge MD, FACS Previous Signature: 12/29/2016 10:00:55 AM Version By: Christin Fudge MD, FACS Entered By: Gretta Cool, BSN, RN, CWS, Kim on 12/29/2016 13:27:44

## 2016-12-29 NOTE — Telephone Encounter (Signed)
-----   Message from Karen Kitchens, NP sent at 12/28/2016  5:28 PM EST ----- Regarding: FW: Needs to start B12 We need to find out where to send orders. She is covered by a Chiropractor service. I have never heard of it. If you can get me the contact information, I will get the order typed up and we can send it on Friday.   Gaspar Bidding  ----- Message ----- From: Lequita Asal, MD Sent: 12/28/2016   4:09 PM To: Karen Kitchens, NP Subject: Needs to start B12                              ASAP  ----- Message ----- From: Interface, [facility] [place] Sent: 12/28/2016   9:55 AM To: Lequita Asal, MD

## 2016-12-29 NOTE — Telephone Encounter (Signed)
Called patient on mobile number listed in chart after no answer to home phone.  Patient will need B-12 injections.  Patient is under [facility].  LVM for patient to call back to confirm the address for Nanine Means so that we can fax an order for her injections.  (There is a [facility] located at [address], [place], [place]  [address]).  Will update when patient calls back.

## 2016-12-30 NOTE — Progress Notes (Signed)
COLINDA, BARTH ([id number]) Visit Report for 12/29/2016 Arrival Information Details Patient Name: [name], MURDY. Date of Service: 12/29/2016 8:45 AM Medical Record Number: [id number] Patient Account Number: [id number] Date of Birth/Sex: Nov 17, 1945 (71 y.o. Female) Treating RN: Montey Hora Primary Care Cheyann Blecha: Lelon Huh Other Clinician: Referring Crosby Bevan: Lelon Huh Treating Oluwaseun Bruyere/Extender: Frann Rider in Treatment: 12 Visit Information History Since Last Visit Added or deleted any medications: No Patient Arrived: Wheel Chair Any new allergies or adverse reactions: No Arrival Time: 09:05 Had a fall or experienced change in No activities of daily living that may affect Accompanied By: husband risk of falls: Transfer Assistance: Manual Signs or symptoms of abuse/neglect since last visito No Patient Identification Verified: Yes Hospitalized since last visit: No Secondary Verification Process Completed: Yes Has Dressing in Place as Prescribed: Yes Patient Requires Transmission-Based No Has Compression in Place as Prescribed: Yes Precautions: Pain Present Now: No Patient Has Alerts: Yes Electronic Signature(s) Signed: 12/29/2016 4:57:04 PM By: Montey Hora Entered By: Montey Hora on 12/29/2016 09:11:10 [name] ([id number]) -------------------------------------------------------------------------------- Compression Therapy Details Patient Name: Jillian Albe Watson. Date of Service: 12/29/2016 8:45 AM Medical Record Number: [id number] Patient Account Number: [id number] Date of Birth/Sex: Apr 15, 1945 (71 y.o. Female) Treating RN: Montey Hora Primary Care Emmanual Gauthreaux: Lelon Huh Other Clinician: Referring Reta Norgren: Lelon Huh Treating Damiyah Ditmars/Extender: Frann Rider in Treatment: 12 Compression Therapy Performed for Wound Assessment: Wound #1 Left,Circumferential Lower Leg Performed By: Clinician Montey Hora, RN Compression Type:  Three Layer Post Procedure Diagnosis Same as Pre-procedure Electronic Signature(s) Signed: 12/29/2016 4:57:04 PM By: Montey Hora Entered By: Montey Hora on 12/29/2016 10:04:21 [name] ([id number]) -------------------------------------------------------------------------------- Encounter Discharge Information Details Patient Name: [name], [name]. Date of Service: 12/29/2016 8:45 AM Medical Record Number: [id number] Patient Account Number: [id number] Date of Birth/Sex: 1945/07/25 (71 y.o. Female) Treating RN: Cornell Barman Primary Care Mckaylee Dimalanta: Lelon Huh Other Clinician: Referring Yahia Bottger: Lelon Huh Treating Gennaro Lizotte/Extender: Frann Rider in Treatment: 12 Encounter Discharge Information Items Discharge Pain Level: 0 Discharge Condition: Stable Ambulatory Status: Wheelchair Discharge Destination: Home Transportation: Private Auto Accompanied By: husband Schedule Follow-up Appointment: Yes Medication Reconciliation completed and Yes provided to Patient/Care Takeira Yanes: Patient Clinical Summary of Care: Declined Electronic Signature(s) Signed: 12/29/2016 4:57:04 PM By: Montey Hora Entered By: Montey Hora on 12/29/2016 10:06:31 [name] ([id number]) -------------------------------------------------------------------------------- Lower Extremity Assessment Details Patient Name: [name], [name]. Date of Service: 12/29/2016 8:45 AM Medical Record Number: [id number] Patient Account Number: [id number] Date of Birth/Sex: 1945-10-16 (71 y.o. Female) Treating RN: Montey Hora Primary Care Margy Sumler: Lelon Huh Other Clinician: Referring Amaryah Mallen: Lelon Huh Treating Bonnye Halle/Extender: Frann Rider in Treatment: 12 Edema Assessment Assessed: [Left: No] [Right: No] [Left: Edema] [Right: :] Calf Left: Right: Point of Measurement: 30 cm From Medial Instep 38.8 cm 40.9 cm Ankle Left: Right: Point of Measurement: 10 cm From Medial  Instep 20.2 cm 23 cm Vascular Assessment Claudication: Claudication Assessment [Left:None] [Right:None] Pulses: Dorsalis Pedis Palpable: [Left:Yes] [Right:Yes] Posterior Tibial Extremity colors, hair growth, and conditions: Extremity Color: [Left:Hyperpigmented] [Right:Hyperpigmented] Hair Growth on Extremity: [Left:No] [Right:No] Temperature of Extremity: [Left:Cool] [Right:Cool] Capillary Refill: [Left:< 3 seconds] [Right:< 3 seconds] Toe Nail Assessment Left: Right: Thick: Yes Yes Discolored: Yes Yes Deformed: Yes Yes Improper Length and Hygiene: No No Electronic Signature(s) Signed: 12/29/2016 4:57:04 PM By: Montey Hora Entered By: Montey Hora on 12/29/2016 09:26:08 [name] ([id number]) -------------------------------------------------------------------------------- Multi Wound Chart Details Patient Name: Jillian Albe Watson. Date of Service: 12/29/2016 8:45 AM Medical Record Number: [id number] Patient Account Number:  [id number] Date of Birth/Sex: 05-31-1945 (71 y.o. Female) Treating RN: Cornell Barman Primary Care Kadience Macchi: Lelon Huh Other Clinician: Referring Hillary Struss: Lelon Huh Treating Lateya Dauria/Extender: Frann Rider in Treatment: 12 Vital Signs Height(in): 64 Pulse(bpm): 43 Weight(lbs): 209 Blood Pressure(mmHg): 111/59 Body Mass Index(BMI): 36 Temperature(F): 97.5 Respiratory Rate 16 (breaths/min): Photos: [8:No Photos] [9:No Photos] Wound Location: Left Lower Leg - Right, Proximal Lower Leg Right, Distal Lower Leg Circumfernential Wounding Event: Gradually Appeared Gradually Appeared Gradually Appeared Primary Etiology: Diabetic Wound/Ulcer of the Lymphedema Lymphedema Lower Extremity Comorbid History: Asthma, Congestive Heart N/A N/A Failure, Coronary Artery Disease, Hypertension, Type II Diabetes, Osteoarthritis Date Acquired: 08/02/2016 12/18/2016 12/18/2016 Weeks of Treatment: 12 1 1  Wound Status: Open Healed - Epithelialized  Healed - Epithelialized Measurements L x W x D 7.5x20x0.3 0x0x0 0x0x0 (cm) Area (cm) : 117.81 0 0 Volume (cm) : 35.343 0 0 % Reduction in Area: -581.80% 100.00% 100.00% % Reduction in Volume: -1945.30% 100.00% 100.00% Classification: Grade 2 Partial Thickness Partial Thickness Exudate Amount: Large N/A N/A Exudate Type: Serosanguineous N/A N/A Exudate Color: red, brown N/A N/A Wound Margin: Distinct, outline attached N/A N/A Granulation Amount: Small (1-33%) N/A N/A Granulation Quality: Red, Hyper-granulation N/A N/A [name], [name] ([id number]) Necrotic Amount: Large (67-100%) N/A N/A Necrotic Tissue: Eschar, Adherent Slough N/A N/A Exposed Structures: Fat Layer (Subcutaneous N/A N/A Tissue) Exposed: Yes Fascia: No Tendon: No Muscle: No Joint: No Bone: No Epithelialization: None N/A N/A Periwound Skin Texture: Excoriation: Yes No Abnormalities Noted No Abnormalities Noted Periwound Skin Moisture: Maceration: Yes No Abnormalities Noted No Abnormalities Noted Periwound Skin Color: Erythema: Yes No Abnormalities Noted No Abnormalities Noted Erythema Location: Circumferential N/A N/A Temperature: No Abnormality N/A N/A Tenderness on Palpation: No No No Wound Preparation: Ulcer Cleansing: N/A N/A Rinsed/Irrigated with Saline, Other: soap and water Topical Anesthetic Applied: Other: lidocaine 4% Treatment Notes Electronic Signature(s) Signed: 12/29/2016 9:57:26 AM By: Christin Fudge MD, FACS Entered By: Christin Fudge on 12/29/2016 09:57:25 [name] ([id number]) -------------------------------------------------------------------------------- [place] Details Patient Name: KEALI, MCCRAW. Date of Service: 12/29/2016 8:45 AM Medical Record Number: [id number] Patient Account Number: [id number] Date of Birth/Sex: 08/18/1945 (71 y.o. Female) Treating RN: Montey Hora Primary Care Gianne Shugars: Lelon Huh Other Clinician: Referring Genoveva Singleton:  Lelon Huh Treating Avaley Coop/Extender: Frann Rider in Treatment: 12 Active Inactive ` Abuse / Safety / Falls / Self Care Management Nursing Diagnoses: History of Falls Potential for falls Goals: Patient will not experience any injury related to falls Date Initiated: 10/02/2016 Target Resolution Date: 01/06/2017 Goal Status: Active Interventions: Assess: immobility, friction, shearing, incontinence upon admission and as needed Assess impairment of mobility on admission and as needed per policy Assess personal safety and home safety (as indicated) on admission and as needed Notes: ` Nutrition Nursing Diagnoses: Imbalanced nutrition Impaired glucose control: actual or potential Potential for alteratiion in Nutrition/Potential for imbalanced nutrition Goals: Patient/caregiver agrees to and verbalizes understanding of need to use nutritional supplements and/or vitamins as prescribed Date Initiated: 10/02/2016 Target Resolution Date: 02/10/2017 Goal Status: Active Patient/caregiver will maintain therapeutic glucose control Date Initiated: 10/02/2016 Target Resolution Date: 01/06/2017 Goal Status: Active Interventions: Assess patient nutrition upon admission and as needed per policy Provide education on elevated blood sugars and impact on wound healing Provide education on nutrition Treatment Activities: Education provided on Nutrition : 10/02/2016 [name], [name] ([id number]) Notes: ` Orientation to the Wound Care Program Nursing Diagnoses: Knowledge deficit related to the wound healing center program Goals: Patient/caregiver will verbalize understanding of the [place]  Date Initiated: 10/02/2016 Target Resolution Date: 11/11/2016 Goal Status: Active Interventions: Provide education on orientation to the wound center Notes: ` Pain, Acute or Chronic Nursing Diagnoses: Pain, acute or chronic: actual or potential Potential alteration in comfort,  pain Goals: Patient/caregiver will verbalize adequate pain control between visits Date Initiated: 10/02/2016 Target Resolution Date: 02/10/2017 Goal Status: Active Interventions: Complete pain assessment as per visit requirements Notes: ` Wound/Skin Impairment Nursing Diagnoses: Impaired tissue integrity Knowledge deficit related to ulceration/compromised skin integrity Goals: Ulcer/skin breakdown will have a volume reduction of 80% by week 12 Date Initiated: 10/02/2016 Target Resolution Date: 02/03/2017 Goal Status: Active Interventions: Assess patient/caregiver ability to perform ulcer/skin care regimen upon admission and as needed Assess ulceration(s) every visit Notes: [name], [name] ([id number]) Electronic Signature(s) Signed: 12/29/2016 4:57:04 PM By: Montey Hora Entered By: Montey Hora on 12/29/2016 09:26:18 [name] ([id number]) -------------------------------------------------------------------------------- Non-Wound Condition Assessment Details Patient Name: [name], [name]. Date of Service: 12/29/2016 8:45 AM Medical Record Number: [id number] Patient Account Number: [id number] Date of Birth/Sex: September 23, 1945 (71 y.o. Female) Treating RN: Montey Hora Primary Care Maybelle Depaoli: Lelon Huh Other Clinician: Referring Jenan Ellegood: Lelon Huh Treating Marcelline Temkin/Extender: Frann Rider in Treatment: 12 Non-Wound Condition: Condition: Lymphedema Location: Leg Side: Right Photos Periwound Skin Texture Texture Color No Abnormalities Noted: No No Abnormalities Noted: No Moisture No Abnormalities Noted: No Electronic Signature(s) Signed: 12/29/2016 4:57:04 PM By: Montey Hora Entered By: Montey Hora on 12/29/2016 10:00:11 [name] ([id number]) -------------------------------------------------------------------------------- Pain Assessment Details Patient Name: [name], [name]. Date of Service: 12/29/2016 8:45 AM Medical Record Number:  [id number] Patient Account Number: [id number] Date of Birth/Sex: 1945/07/01 (71 y.o. Female) Treating RN: Montey Hora Primary Care Reza Crymes: Lelon Huh Other Clinician: Referring Myrl Bynum: Lelon Huh Treating Dodd Schmid/Extender: Frann Rider in Treatment: 12 Active Problems Location of Pain Severity and Description of Pain Patient Has Paino No Site Locations With Dressing Change: No Pain Management and Medication Current Pain Management: Goals for Pain Management Topical or injectable lidocaine is offered to patient for acute pain when surgical debridement is performed. If needed, Patient is instructed to use over the counter pain medication for the following 24-48 hours after debridement. Wound care MDs do not prescribed pain medications. Patient has chronic pain or uncontrolled pain. Patient has been instructed to make an appointment with their Primary Care Physician for pain management. Electronic Signature(s) Signed: 12/29/2016 4:57:04 PM By: Montey Hora Entered By: Montey Hora on 12/29/2016 09:11:19 [name] ([id number]) -------------------------------------------------------------------------------- Patient/Caregiver Education Details Patient Name: [name] Date of Service: 12/29/2016 8:45 AM Medical Record Number: [id number] Patient Account Number: [id number] Date of Birth/Gender: Sep 26, 1945 (71 y.o. Female) Treating RN: Montey Hora Primary Care Physician: Lelon Huh Other Clinician: Referring Physician: Lelon Huh Treating Physician/Extender: Frann Rider in Treatment: 12 Education Assessment Education Provided To: Patient Education Topics Provided Venous: Handouts: Controlling Swelling with Multilayered Compression Wraps Methods: Demonstration, Explain/Verbal Responses: State content correctly Wound/Skin Impairment: Handouts: Caring for Your Ulcer Methods: Demonstration Responses: State content  correctly Electronic Signature(s) Signed: 12/29/2016 4:57:04 PM By: Montey Hora Entered By: Montey Hora on 12/29/2016 10:06:52 [name] ([id number]) -------------------------------------------------------------------------------- Wound Assessment Details Patient Name: Jillian Albe Watson. Date of Service: 12/29/2016 8:45 AM Medical Record Number: [id number] Patient Account Number: [id number] Date of Birth/Sex: 1945-06-01 (71 y.o. Female) Treating RN: Montey Hora Primary Care Kaiah Hosea: Lelon Huh Other Clinician: Referring Aidynn Polendo: Lelon Huh Treating Jassiel Flye/Extender: Frann Rider in Treatment: 12 Wound Status Wound Number: 1 Primary Diabetic Wound/Ulcer of the Lower Extremity Etiology: Wound Location:  Left Lower Leg - Circumfernential Wound Open Wounding Event: Gradually Appeared Status: Date Acquired: 08/02/2016 Comorbid Asthma, Congestive Heart Failure, Coronary Weeks Of Treatment: 12 History: Artery Disease, Hypertension, Type II Diabetes, Clustered Wound: No Osteoarthritis Photos Wound Measurements Length: (cm) 7.5 Width: (cm) 20 Depth: (cm) 0.3 Area: (cm) 117.81 Volume: (cm) 35.343 % Reduction in Area: -581.8% % Reduction in Volume: -1945.3% Epithelialization: None Tunneling: No Undermining: No Wound Description Classification: Grade 2 Wound Margin: Distinct, outline attached Exudate Amount: Large Exudate Type: Serosanguineous Exudate Color: red, brown Foul Odor After Cleansing: No Slough/Fibrino Yes Wound Bed Granulation Amount: Small (1-33%) Exposed Structure Granulation Quality: Red, Hyper-granulation Fascia Exposed: No Necrotic Amount: Large (67-100%) Fat Layer (Subcutaneous Tissue) Exposed: Yes Necrotic Quality: Eschar, Adherent Slough Tendon Exposed: No Muscle Exposed: No Joint Exposed: No Bone Exposed: No Periwound Skin Texture Texture Color No Abnormalities Noted: No No Abnormalities Noted: No Excoriation:  Yes Erythema: Yes [name], [name]. ([id number]) Moisture Erythema Location: Circumferential No Abnormalities Noted: No Temperature / Pain Maceration: Yes Temperature: No Abnormality Wound Preparation Ulcer Cleansing: Rinsed/Irrigated with Saline, Other: soap and water, Topical Anesthetic Applied: Other: lidocaine 4%, Treatment Notes Wound #1 (Left, Circumferential Lower Leg) 1. Cleansed with: Clean wound with Normal Saline Cleanse wound with antibacterial soap and water 2. Anesthetic Topical Lidocaine 4% cream to wound bed prior to debridement 4. Dressing Applied: Other dressing (specify in notes) 5. Secondary Dressing Applied ABD Pad Dry Gauze 7. Secured with Tape 3 Layer Compression System - Bilateral Notes silvercel, unna to anchor, xtrasorb, charcoal (Left) Electronic Signature(s) Signed: 12/29/2016 4:57:04 PM By: Montey Hora Entered By: Montey Hora on 12/29/2016 09:38:26 [name] ([id number]) -------------------------------------------------------------------------------- Wound Assessment Details Patient Name: [name], [name]. Date of Service: 12/29/2016 8:45 AM Medical Record Number: [id number] Patient Account Number: [id number] Date of Birth/Sex: 1945-07-20 (71 y.o. Female) Treating RN: Montey Hora Primary Care Kiev Labrosse: Lelon Huh Other Clinician: Referring Huntleigh Doolen: Lelon Huh Treating Teiana Hajduk/Extender: Frann Rider in Treatment: 12 Wound Status Wound Number: 8 Primary Etiology: Lymphedema Wound Location: Right, Proximal Lower Leg Wound Status: Healed - Epithelialized Wounding Event: Gradually Appeared Date Acquired: 12/18/2016 Weeks Of Treatment: 1 Clustered Wound: No Photos Photo Uploaded By: Montey Hora on 12/29/2016 10:03:25 Wound Measurements Length: (cm) 0 Width: (cm) 0 Depth: (cm) 0 Area: (cm) 0 Volume: (cm) 0 % Reduction in Area: 100% % Reduction in Volume: 100% Wound Description Classification: Partial  Thickness Periwound Skin Texture Texture Color No Abnormalities Noted: No No Abnormalities Noted: No Moisture No Abnormalities Noted: No Electronic Signature(s) Signed: 12/29/2016 4:57:04 PM By: Montey Hora Entered By: Montey Hora on 12/29/2016 [place], [place]. ([id number]) -------------------------------------------------------------------------------- Wound Assessment Details Patient Name: Jillian Albe Watson. Date of Service: 12/29/2016 8:45 AM Medical Record Number: [id number] Patient Account Number: [id number] Date of Birth/Sex: 02-16-1945 (71 y.o. Female) Treating RN: Montey Hora Primary Care Carneshia Raker: Lelon Huh Other Clinician: Referring Almond Fitzgibbon: Lelon Huh Treating Lanika Colgate/Extender: Frann Rider in Treatment: 12 Wound Status Wound Number: 9 Primary Etiology: Lymphedema Wound Location: Right, Distal Lower Leg Wound Status: Healed - Epithelialized Wounding Event: Gradually Appeared Date Acquired: 12/18/2016 Weeks Of Treatment: 1 Clustered Wound: No Photos Photo Uploaded By: Montey Hora on 12/29/2016 10:03:25 Wound Measurements Length: (cm) 0 Width: (cm) 0 Depth: (cm) 0 Area: (cm) 0 Volume: (cm) 0 % Reduction in Area: 100% % Reduction in Volume: 100% Wound Description Classification: Partial Thickness Periwound Skin Texture Texture Color No Abnormalities Noted: No No Abnormalities Noted: No Moisture No Abnormalities Noted: No Electronic Signature(s) Signed: 12/29/2016 4:57:04 PM  By: Montey Hora Entered By: Montey Hora on 12/29/2016 09:36:12 [name] ([id number]) -------------------------------------------------------------------------------- [place] Details Patient Name: [name] Date of Service: 12/29/2016 8:45 AM Medical Record Number: [id number] Patient Account Number: [id number] Date of Birth/Sex: 1945-11-05 (71 y.o. Female) Treating RN: Montey Hora Primary Care Lucca Ballo: Lelon Huh Other  Clinician: Referring Yadier Bramhall: Lelon Huh Treating Hikeem Andersson/Extender: Frann Rider in Treatment: 12 Vital Signs Time Taken: 09:11 Temperature (F): 97.5 Height (in): 64 Pulse (bpm): 59 Weight (lbs): 209 Respiratory Rate (breaths/min): 16 Body Mass Index (BMI): 35.9 Blood Pressure (mmHg): 111/59 Reference Range: 80 - 120 mg / dl Electronic Signature(s) Signed: 12/29/2016 4:57:04 PM By: Montey Hora Entered By: Montey Hora on 12/29/2016 09:11:51

## 2016-12-31 ENCOUNTER — Encounter: Payer: Self-pay | Admitting: Hematology and Oncology

## 2017-01-01 ENCOUNTER — Telehealth: Payer: Self-pay | Admitting: *Deleted

## 2017-01-01 DIAGNOSIS — I509 Heart failure, unspecified: Secondary | ICD-10-CM | POA: Diagnosis not present

## 2017-01-01 DIAGNOSIS — I429 Cardiomyopathy, unspecified: Secondary | ICD-10-CM | POA: Diagnosis not present

## 2017-01-01 DIAGNOSIS — J45909 Unspecified asthma, uncomplicated: Secondary | ICD-10-CM | POA: Diagnosis not present

## 2017-01-01 DIAGNOSIS — I110 Hypertensive heart disease with heart failure: Secondary | ICD-10-CM | POA: Diagnosis not present

## 2017-01-01 DIAGNOSIS — L97222 Non-pressure chronic ulcer of left calf with fat layer exposed: Secondary | ICD-10-CM | POA: Diagnosis not present

## 2017-01-01 DIAGNOSIS — Z4800 Encounter for change or removal of nonsurgical wound dressing: Secondary | ICD-10-CM | POA: Diagnosis not present

## 2017-01-01 DIAGNOSIS — E11622 Type 2 diabetes mellitus with other skin ulcer: Secondary | ICD-10-CM | POA: Diagnosis not present

## 2017-01-01 DIAGNOSIS — Z7984 Long term (current) use of oral hypoglycemic drugs: Secondary | ICD-10-CM | POA: Diagnosis not present

## 2017-01-01 DIAGNOSIS — I272 Pulmonary hypertension, unspecified: Secondary | ICD-10-CM | POA: Diagnosis not present

## 2017-01-01 DIAGNOSIS — I2510 Atherosclerotic heart disease of native coronary artery without angina pectoris: Secondary | ICD-10-CM | POA: Diagnosis not present

## 2017-01-01 DIAGNOSIS — M1990 Unspecified osteoarthritis, unspecified site: Secondary | ICD-10-CM | POA: Diagnosis not present

## 2017-01-01 DIAGNOSIS — Z79891 Long term (current) use of opiate analgesic: Secondary | ICD-10-CM | POA: Diagnosis not present

## 2017-01-01 NOTE — Telephone Encounter (Signed)
Called and spoke to patient to ask for information regarding [facility].  She told me "she does not know how to give me the information that I would need to speak to her husband".  When I asked to speak to him she states "he was not home".  I then asked if he could call me back with the information and she stated "he would be gone a few days".    I will try again later this week.

## 2017-01-01 NOTE — Telephone Encounter (Signed)
-----   Message from Karen Kitchens, NP sent at 12/28/2016  5:28 PM EST ----- Regarding: FW: Needs to start B12 We need to find out where to send orders. She is covered by a Chiropractor service. I have never heard of it. If you can get me the contact information, I will get the order typed up and we can send it on Friday.   Gaspar Bidding  ----- Message ----- From: Lequita Asal, MD Sent: 12/28/2016   4:09 PM To: Karen Kitchens, NP Subject: Needs to start B12                              ASAP  ----- Message ----- From: Interface, [facility] [place] Sent: 12/28/2016   9:55 AM To: Lequita Asal, MD

## 2017-01-03 DIAGNOSIS — I2510 Atherosclerotic heart disease of native coronary artery without angina pectoris: Secondary | ICD-10-CM | POA: Diagnosis not present

## 2017-01-03 DIAGNOSIS — L97222 Non-pressure chronic ulcer of left calf with fat layer exposed: Secondary | ICD-10-CM | POA: Diagnosis not present

## 2017-01-03 DIAGNOSIS — Z4800 Encounter for change or removal of nonsurgical wound dressing: Secondary | ICD-10-CM | POA: Diagnosis not present

## 2017-01-03 DIAGNOSIS — Z7984 Long term (current) use of oral hypoglycemic drugs: Secondary | ICD-10-CM | POA: Diagnosis not present

## 2017-01-03 DIAGNOSIS — I272 Pulmonary hypertension, unspecified: Secondary | ICD-10-CM | POA: Diagnosis not present

## 2017-01-03 DIAGNOSIS — I509 Heart failure, unspecified: Secondary | ICD-10-CM | POA: Diagnosis not present

## 2017-01-03 DIAGNOSIS — I110 Hypertensive heart disease with heart failure: Secondary | ICD-10-CM | POA: Diagnosis not present

## 2017-01-03 DIAGNOSIS — Z79891 Long term (current) use of opiate analgesic: Secondary | ICD-10-CM | POA: Diagnosis not present

## 2017-01-03 DIAGNOSIS — E11622 Type 2 diabetes mellitus with other skin ulcer: Secondary | ICD-10-CM | POA: Diagnosis not present

## 2017-01-03 DIAGNOSIS — M1990 Unspecified osteoarthritis, unspecified site: Secondary | ICD-10-CM | POA: Diagnosis not present

## 2017-01-03 DIAGNOSIS — E113393 Type 2 diabetes mellitus with moderate nonproliferative diabetic retinopathy without macular edema, bilateral: Secondary | ICD-10-CM | POA: Diagnosis not present

## 2017-01-03 DIAGNOSIS — I429 Cardiomyopathy, unspecified: Secondary | ICD-10-CM | POA: Diagnosis not present

## 2017-01-03 DIAGNOSIS — J45909 Unspecified asthma, uncomplicated: Secondary | ICD-10-CM | POA: Diagnosis not present

## 2017-01-03 LAB — HM DIABETES EYE EXAM

## 2017-01-05 ENCOUNTER — Encounter: Payer: Medicare Other | Attending: Physician Assistant | Admitting: Physician Assistant

## 2017-01-05 ENCOUNTER — Other Ambulatory Visit: Payer: Self-pay

## 2017-01-05 ENCOUNTER — Inpatient Hospital Stay: Payer: Medicare Other | Attending: Hematology and Oncology

## 2017-01-05 DIAGNOSIS — L97822 Non-pressure chronic ulcer of other part of left lower leg with fat layer exposed: Secondary | ICD-10-CM | POA: Diagnosis not present

## 2017-01-05 DIAGNOSIS — Z7984 Long term (current) use of oral hypoglycemic drugs: Secondary | ICD-10-CM | POA: Insufficient documentation

## 2017-01-05 DIAGNOSIS — L97222 Non-pressure chronic ulcer of left calf with fat layer exposed: Secondary | ICD-10-CM | POA: Diagnosis not present

## 2017-01-05 DIAGNOSIS — I890 Lymphedema, not elsewhere classified: Secondary | ICD-10-CM | POA: Diagnosis not present

## 2017-01-05 DIAGNOSIS — E11622 Type 2 diabetes mellitus with other skin ulcer: Secondary | ICD-10-CM | POA: Diagnosis not present

## 2017-01-05 DIAGNOSIS — I2510 Atherosclerotic heart disease of native coronary artery without angina pectoris: Secondary | ICD-10-CM | POA: Insufficient documentation

## 2017-01-05 DIAGNOSIS — I110 Hypertensive heart disease with heart failure: Secondary | ICD-10-CM | POA: Insufficient documentation

## 2017-01-05 DIAGNOSIS — I428 Other cardiomyopathies: Secondary | ICD-10-CM | POA: Diagnosis not present

## 2017-01-05 DIAGNOSIS — C9000 Multiple myeloma not having achieved remission: Secondary | ICD-10-CM

## 2017-01-05 DIAGNOSIS — I509 Heart failure, unspecified: Secondary | ICD-10-CM | POA: Diagnosis not present

## 2017-01-06 NOTE — Progress Notes (Signed)
Jillian, Watson ([id number]) Visit Report for 01/05/2017 Chief Complaint Document Details Patient Name: [name], [name]. Date of Service: 01/05/2017 9:15 AM Medical Record Number: [id number] Patient Account Number: [id number] Date of Birth/Sex: 02/21/1945 (72 y.o. Female) Treating RN: Roger Shelter Primary Care Provider: Lelon Huh Other Clinician: Referring Provider: Lelon Huh Treating Provider/Extender: Melburn Hake, HOYT Weeks in Treatment: 13 Information Obtained from: Patient Chief Complaint Patient seen for complaints of Non-Healing Wounds to both lower extremities which she's had for about 2 months with swelling. Electronic Signature(s) Signed: 01/05/2017 7:56:36 PM By: Worthy Keeler PA-C Entered By: Worthy Keeler on 01/05/2017 09:53:26 Oettinger, Jeanice Lim ([id number]) -------------------------------------------------------------------------------- Debridement Details Patient Name: Jillian Albe B. Date of Service: 01/05/2017 9:15 AM Medical Record Number: [id number] Patient Account Number: [id number] Date of Birth/Sex: Dec 31, 1945 (72 y.o. Female) Treating RN: Roger Shelter Primary Care Provider: Lelon Huh Other Clinician: Referring Provider: Lelon Huh Treating Provider/Extender: Melburn Hake, HOYT Weeks in Treatment: 13 Debridement Performed for Wound #1 Left,Circumferential Lower Leg Assessment: Performed By: Physician STONE III, HOYT E., PA-C Debridement: Debridement Severity of Tissue Pre Fat layer exposed Debridement: Pre-procedure Verification/Time Yes - 10:10 Out Taken: Start Time: 10:10 Pain Control: Other : lidocaine 4% Level: Skin/Subcutaneous Tissue Total Area Debrided (L x W): 6.5 (cm) x 4.6 (cm) = 29.9 (cm) Tissue and other material Viable, Non-Viable, Eschar, Fibrin/Slough, Skin, Subcutaneous debrided: Instrument: Curette Bleeding: Moderate Hemostasis Achieved: Pressure End Time: 10:12 Procedural Pain: 1 Post Procedural Pain:  0 Response to Treatment: Procedure was tolerated well Post Debridement Measurements of Total Wound Length: (cm) 6.5 Width: (cm) 4.6 Depth: (cm) 0.4 Volume: (cm) 9.393 Character of Wound/Ulcer Post Debridement: Stable Severity of Tissue Post Debridement: Fat layer exposed Post Procedure Diagnosis Same as Pre-procedure Electronic Signature(s) Signed: 01/05/2017 4:40:13 PM By: Roger Shelter Signed: 01/05/2017 7:56:36 PM By: Worthy Keeler PA-C Entered By: Roger Shelter on 01/05/2017 10:13:04 [name] ([id number]) -------------------------------------------------------------------------------- HPI Details Patient Name: Jillian Albe B. Date of Service: 01/05/2017 9:15 AM Medical Record Number: [id number] Patient Account Number: [id number] Date of Birth/Sex: Dec 18, 1945 (72 y.o. Female) Treating RN: Roger Shelter Primary Care Provider: Lelon Huh Other Clinician: Referring Provider: Lelon Huh Treating Provider/Extender: Melburn Hake, HOYT Weeks in Treatment: 13 History of Present Illness Location: both lower extremity swelling with ulceration HPI Description: 72 year old patient who sees her PCP Dr. Lelon Huh was recently evaluated 10 days ago for diabetes mellitus, hypertension, CHF and hyperlipidemia. she also was noted to have ulcerations develop in her legs and she has been applying Silvadene dressings locally. In the past she has refused wound care referrals.her cardiologist Dr. Saunders Revel saw her and put her on 40 mg of furosemide daily. last hemoglobin A1c was 7.7%. she was also placed on ciprofloxacin twice daily for 7 days and a urine culture was recommended. past medical history significant for coronary artery disease, diabetes mellitus, nonischemic cardiomyopathy and pulmonary hypertension. She is also status post heart catheterization and coronary angiography, tubal ligation and breast cyst removal in the past. She has never been a smoker. Patient had arterial  studies done which showed bilateral ABIs are artificially elevated due to noncompressible and calcified vessels. Triphasic waveform throughout. Right great toe TBI is elevated while the left is normal. 10/23/2016 -- the patient is rather moribund from several issues including chronic back pain and knee pain and swelling of her legs. The large necrotic area on her left lateral anterior calf was bleeding on touch after washing her leg. There was a spot which needed silver nitrate  cauterization and this was done appropriately. 11/06/2016 -- she is again noted to have friable bleeding from the left lower extremity wounds and this again had to be cauterized with silver nitrate to control the bleeding as pressure itself would not do it. 11/20/2016 -- the right lower extremity is completely healed and we have ordered 30-40 mm compression stocking's in both the dural layer and also a pair of juxta lites. 12/18/2016 -- she has not been wearing her compression stockings on her right leg and these have opened out into ulcerations again. Her left lower extremity has circumferential ulcerations now. I believe at this stage I would like to get her venous reflux studies done to make certain that there is no fixable superficial venous reflux. 12/29/2016 -- she has made an excellent recovery having continued with her appropriate doses of diuretics and elevation and exercise. She has not yet received her right lower extremity juxta lites. Her venous duplex study is at the end of January 01/06/16 on evaluation today patient appears to be doing fairly well in regard to her right lower extremity there are no open wounds. Her left lower extremity shows three wound areas with the left lateral wound showing slough the other two areas do not show any evidence of slough at this point. In fact the inferior region is very superficial. Nonetheless there is hyper granular tissue noted over the majority of the wound surfaces.  Patient is having some discomfort mainly with cleansing of the wounds and on the lateral aspect especially. Electronic Signature(s) Signed: 01/05/2017 7:56:36 PM By: Worthy Keeler PA-C Entered By: Worthy Keeler on 01/05/2017 10:24:01 [name] ([id number]) -------------------------------------------------------------------------------- Physical Exam Details Patient Name: JANNICE, BEITZEL B. Date of Service: 01/05/2017 9:15 AM Medical Record Number: [id number] Patient Account Number: [id number] Date of Birth/Sex: [birth year] (72 y.o. Female) Treating RN: Roger Shelter Primary Care Provider: Lelon Huh Other Clinician: Referring Provider: Lelon Huh Treating Provider/Extender: Melburn Hake, HOYT Weeks in Treatment: 79 Constitutional Well-nourished and well-hydrated in no acute distress. Respiratory normal breathing without difficulty. clear to auscultation bilaterally. Cardiovascular regular rate and rhythm with normal S1, S2. 1+ pitting edema of the bilateral lower extremities. Psychiatric this patient is able to make decisions and demonstrates good insight into disease process. Alert and Oriented x 3. pleasant and cooperative. Notes Patient has slough noted on the left lateral wound at this point in time which did require sharp debridement and she tolerated this well today with minimal discomfort although she did have one point especially on the very lateral portion where she had some pain. Otherwise there's no evidence of infection. The remainder of her wounds appeared to be hyper granular but were otherwise okay. Electronic Signature(s) Signed: 01/05/2017 7:56:36 PM By: Worthy Keeler PA-C Entered By: Worthy Keeler on 01/05/2017 10:25:11 [name] ([id number]) -------------------------------------------------------------------------------- Physician Orders Details Patient Name: SIBLEY, ROLISON B. Date of Service: 01/05/2017 9:15 AM Medical Record Number:  [id number] Patient Account Number: [id number] Date of Birth/Sex: 10-Aug-1945 (72 y.o. 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 o  Cleanse wound with mild soap and water o May Shower, gently pat wound dry prior to applying new dressing. Wound #10 Left,Medial,Anterior Lower Leg o Clean wound with Normal Saline. o Cleanse wound with mild soap and water o May Shower, gently pat wound dry prior to applying new dressing. Anesthetic (add to Medication List) Wound #1 Left,Circumferential Lower Leg o Topical Lidocaine 4% cream applied to wound bed prior to debridement (In Clinic Only). Wound #10 Left,Medial,Anterior Lower Leg o Topical Lidocaine 4% cream applied to wound bed prior to debridement (In Clinic Only). Primary Wound Dressing Wound #1 Left,Circumferential Lower Leg o Hydrafera Blue Wound #10 Left,Medial,Anterior Lower Leg o Hydrafera Blue Secondary Dressing Wound #1 Left,Circumferential Lower Leg o ABD pad o XtraSorb Wound #10 Left,Medial,Anterior Lower Leg o ABD pad o XtraSorb Dressing Change Frequency MARIANGEL, RINGLEY ([id number]) Wound #1 Left,Circumferential Lower Leg o Change Dressing Monday, Wednesday, Friday - HHRN to change Monday, Wednesday and Fridays and pt to come to [facility] on Fridays. Follow-up Appointments o Return Appointment in 1 week. Edema Control Wound #1  Left,Circumferential Lower Leg o 3 Layer Compression System - Bilateral - unna to anchor o Patient to wear own compression stockings - Right leg-patient can transition to compressing stockings once the get them from [facility]. o Elevate legs to the level of the heart and pump ankles as often as possible Additional Orders / Instructions Wound #1 Left,Circumferential Lower Leg o Vitamin A; Vitamin C, Zinc o Increase protein intake. Home Health Wound #1 Left,Circumferential Lower Leg o Continue Home Health Visits - follow-up on ordering compression stockings o Home Health Nurse may visit PRN to address patientos wound care needs. o FACE TO FACE ENCOUNTER: MEDICARE and MEDICAID PATIENTS: I certify that this patient is under my care and that I had a face-to-face encounter that meets the physician face-to-face encounter requirements with this patient on this date. The encounter with the patient was in whole or in part for the following MEDICAL CONDITION: (primary reason for [place]) MEDICAL NECESSITY: I certify, that based on my findings, NURSING services are a medically necessary home health service. HOME BOUND STATUS: I certify that my clinical findings support that this patient is homebound (i.e., Due to illness or injury, pt requires aid of supportive devices such as crutches, cane, wheelchairs, walkers, the use of special transportation or the assistance of another person to leave their place of residence. There is a normal inability to leave the home and doing so requires considerable and taxing effort. Other absences are for medical reasons / religious services and are infrequent or of short duration when for other reasons). o If current dressing causes regression in wound condition, may D/C ordered dressing product/s and apply Normal Saline Moist Dressing daily until next [place] / Other MD appointment. [place] of regression in  wound condition at [phone]. o Please direct any NON-WOUND related issues/requests for orders to patient's Primary Care Physician Electronic Signature(s) Signed: 01/05/2017 4:40:13 PM By: Roger Shelter Signed: 01/05/2017 7:56:36 PM By: Worthy Keeler PA-C Entered By: Roger Shelter on 01/05/2017 10:15:43 [name] ([id number]) -------------------------------------------------------------------------------- Problem List Details Patient Name: ZAMARIAH, SEABORN B. Date of Service: 01/05/2017 9:15 AM Medical Record Number: [id number] Patient Account Number: [id number] Date of Birth/Sex: [birth year] (72 y.o. 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 Date of Service: 01/05/2017 9:15 AM Medical Record Number: [id number] Patient Account Number: [id number] Date of Birth/Sex: 1945-05-03 (72 y.o. Female) Treating RN: Roger Shelter Primary Care Provider: Lelon Huh Other Clinician: Referring Provider: Lelon Huh Treating Provider/Extender: Melburn Hake, HOYT Weeks in Treatment: 13 Subjective Chief Complaint Information obtained from  Patient Patient seen for complaints of Non-Healing Wounds to both lower extremities which she's had for about 2 months with swelling. History of Present Illness (HPI) The following HPI elements were documented for the patient's wound: Location: both lower extremity swelling with ulceration 73 year old patient who sees her PCP Dr. Lelon Huh was recently evaluated 10 days ago for diabetes mellitus, hypertension, CHF and hyperlipidemia. she also was noted to have ulcerations develop in her legs and she has been applying Silvadene dressings locally. In the past she has refused wound care referrals.her cardiologist Dr. Saunders Revel saw her and put her on 40 mg of furosemide daily. last hemoglobin A1c was 7.7%. she was also placed on ciprofloxacin twice daily for 7 days and a urine culture was recommended. past medical history significant for coronary artery disease, diabetes mellitus, nonischemic cardiomyopathy and pulmonary hypertension. She is also status post heart catheterization and coronary angiography, tubal ligation and breast cyst removal in the past. She has never been a smoker. Patient had arterial studies done which showed bilateral ABIs are artificially elevated due to noncompressible and calcified vessels. Triphasic waveform throughout. Right great toe TBI is elevated while the left is normal. 10/23/2016 -- the patient is rather moribund from several issues including chronic back pain and knee pain and swelling of her legs. The large necrotic area on her left lateral anterior calf was bleeding on touch after washing her leg. There was a spot which needed silver nitrate cauterization and this was done appropriately. 11/06/2016 -- she is again noted to have friable bleeding from the left lower extremity wounds and this again had to be cauterized with silver nitrate to control the bleeding as pressure itself would not do it. 11/20/2016 -- the right lower extremity is completely healed and we  have ordered 30-40 mm compression stocking's in both the dural layer and also a pair of juxta lites. 12/18/2016 -- she has not been wearing her compression stockings on her right leg and these have opened out into ulcerations again. Her left lower extremity has circumferential ulcerations now. I believe at this stage I would like to get her venous reflux studies done to make certain that there is no fixable superficial venous reflux. 12/29/2016 -- she has made an excellent recovery having continued with her appropriate doses of diuretics and elevation and exercise. She has not yet received her right lower extremity juxta lites. Her venous duplex study is at the end of January 01/06/16 on evaluation today patient appears to be doing fairly well in regard to her right lower extremity there are no open wounds. Her left lower extremity shows three wound areas with the left lateral wound showing slough the other two areas do not show any evidence of slough at this  point. In fact the inferior region is very superficial. Nonetheless there is hyper granular tissue noted over the majority of the wound surfaces. Patient is having some discomfort mainly with cleansing of the wounds and on the lateral aspect especially. Patient History Information obtained from Patient. CHINAZA, ROOKE ([id number]) Family History Cancer - Mother,Siblings, Diabetes - Mother,Siblings, Heart Disease - Mother, Hypertension - Siblings, No family history of Hereditary Spherocytosis, Kidney Disease, Lung Disease, Seizures, Stroke, Thyroid Problems, Tuberculosis. Social History Never smoker, Marital Status - Married, Alcohol Use - Never, Drug Use - No History, Caffeine Use - Daily. Review of Systems (ROS) Constitutional Symptoms (General Health) Denies complaints or symptoms of Fever, Chills. Respiratory The patient has no complaints or symptoms. Cardiovascular Complains or has symptoms of LE edema. Psychiatric The patient  has no complaints or symptoms. Objective Constitutional Well-nourished and well-hydrated in no acute distress. Vitals Time Taken: 9:24 AM, Height: 64 in, Weight: 209 lbs, BMI: 35.9, Temperature: 97.8 F, Pulse: 61 bpm, Respiratory Rate: 16 breaths/min, Blood Pressure: 148/80 mmHg. Respiratory normal breathing without difficulty. clear to auscultation bilaterally. Cardiovascular regular rate and rhythm with normal S1, S2. 1+ pitting edema of the bilateral lower extremities. Psychiatric this patient is able to make decisions and demonstrates good insight into disease process. Alert and Oriented x 3. pleasant and cooperative. General Notes: Patient has slough noted on the left lateral wound at this point in time which did require sharp debridement and she tolerated this well today with minimal discomfort although she did have one point especially on the very lateral portion where she had some pain. Otherwise there's no evidence of infection. The remainder of her wounds appeared to be hyper granular but were otherwise okay. Integumentary (Hair, Skin) Wound #1 status is Open. Original cause of wound was Gradually Appeared. The wound is located on the Left,Circumferential Lower Leg. The wound measures 6.5cm length x 4.6cm width x 0.3cm depth; 23.483cm^2 area and 7.045cm^3 volume. Wound #10 status is Open. Original cause of wound was Gradually Appeared. The wound is located on the Left,Medial,Anterior Lower Leg. The wound measures 3.1cm length x 3.4cm width x 0.2cm depth; 8.278cm^2 area and [place], [place] ([id number]) 1.656cm^3 volume. There is Fat Layer (Subcutaneous Tissue) Exposed exposed. There is no tunneling or undermining noted. There is a large amount of serosanguineous drainage noted. The wound margin is distinct with the outline attached to the wound base. There is medium (34-66%) red, hyper - granulation within the wound bed. There is a medium (34-66%) amount of necrotic tissue within  the wound bed including Adherent Slough. The periwound skin appearance exhibited: Excoriation, Induration. The periwound skin appearance did not exhibit: Callus, Crepitus, Rash, Scarring, Dry/Scaly, Maceration, Atrophie Blanche, Cyanosis, Ecchymosis, Hemosiderin Staining, Mottled, Pallor, Rubor, Erythema. Periwound temperature was noted as No Abnormality. The periwound has tenderness on palpation. 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 There was a Skin/Subcutaneous Tissue Debridement ([id number]) debridement with total area of 29.9 sq cm performed by STONE III, HOYT E., PA-C. with the following instrument(s): Curette to remove Viable and Non-Viable tissue/material including Fibrin/Slough, Eschar, Skin, and Subcutaneous after achieving pain  control using Other (lidocaine 4%). A time out was conducted at 10:10, prior to the start of the procedure. A Moderate amount of bleeding was controlled with Pressure. The procedure was tolerated well with a pain level of 1 throughout and a pain level of 0 following the procedure. Post Debridement Measurements: 6.5cm length x 4.6cm width x 0.4cm depth; 9.393cm^3 volume. Character of Wound/Ulcer Post Debridement is stable. Severity of Tissue Post Debridement is: Fat layer exposed. Post procedure Diagnosis Wound #1: Same as Pre-Procedure Plan Wound Cleansing: Wound #1 Left,Circumferential Lower Leg: Clean wound with Normal Saline. Cleanse wound with mild soap and water May Shower, gently pat wound dry prior  to applying new dressing. Wound #10 Left,Medial,Anterior Lower Leg: Clean wound with Normal Saline. Cleanse wound with mild soap and water May Shower, gently pat wound dry prior to applying new dressing. Anesthetic (add to Medication List): Wound #1 Left,Circumferential Lower Leg: DEJAE, BERNET. ([id number]) Topical Lidocaine 4% cream applied to wound bed prior to debridement (In Clinic Only). Wound #10 Left,Medial,Anterior Lower Leg: Topical Lidocaine 4% cream applied to wound bed prior to debridement (In Clinic Only). Primary Wound Dressing: Wound #1 Left,Circumferential Lower Leg: Hydrafera Blue Wound #10 Left,Medial,Anterior Lower Leg: Hydrafera Blue Secondary Dressing: Wound #1 Left,Circumferential Lower Leg: ABD pad XtraSorb Wound #10 Left,Medial,Anterior Lower Leg: ABD pad XtraSorb Dressing Change Frequency: Wound #1 Left,Circumferential Lower Leg: Change Dressing Monday, Wednesday, Friday - HHRN to change Monday, Wednesday and Fridays and pt to come to [place] on Fridays. Follow-up Appointments: Return Appointment in 1 week. Edema Control: Wound #1 Left,Circumferential Lower Leg: 3 Layer Compression System - Bilateral - unna to anchor Patient to wear own compression stockings - Right leg-patient can transition to compressing stockings once the get them from [facility]. Elevate legs to the level of the heart and pump ankles as often as possible Additional Orders / Instructions: Wound #1 Left,Circumferential Lower Leg: Vitamin A; Vitamin C, Zinc Increase protein intake. Home Health: Wound #1 Left,Circumferential Lower Leg: Continue Home Health Visits - follow-up on ordering compression stockings Home Health Nurse may visit PRN to address patient s wound care needs. FACE TO FACE ENCOUNTER: MEDICARE and MEDICAID PATIENTS: I certify that this patient is under my care and that I had a face-to-face encounter that meets the physician face-to-face encounter  requirements with this patient on this date. The encounter with the patient was in whole or in part for the following MEDICAL CONDITION: (primary reason for [place]) MEDICAL NECESSITY: I certify, that based on my findings, NURSING services are a medically necessary home health service. HOME BOUND STATUS: I certify that my clinical findings support that this patient is homebound (i.e., Due to illness or injury, pt requires aid of supportive devices such as crutches, cane, wheelchairs, walkers, the use of special transportation or the assistance of another person to leave their place of residence. There is a normal inability to leave the home and doing so requires considerable and taxing effort. Other absences are for medical reasons / religious services and are infrequent or of short duration when for other reasons). If current dressing causes regression in wound condition, may D/C ordered dressing product/s and apply Normal Saline Moist Dressing daily until next [place] / Other MD appointment. [place] of regression in wound condition at [phone]. Please direct any NON-WOUND related issues/requests for orders to patient's Primary Care Physician At this point I'm gonna recommend that we discontinue the Santyl and switch to [employer]. We  will see how things do over the next week. Following debridement there really is no slough remaining and I did not feel there was any need for continued Santyl treatment. Please see above for specific wound care orders. We will see patient for re-evaluation in 1 week(s) here in the clinic. If anything worsens or changes patient will contact our office for additional recommendations. DUANNA, RUNK ([id number]) Electronic Signature(s) Signed: 01/05/2017 7:56:36 PM By: Worthy Keeler PA-C Entered By: Worthy Keeler on 01/05/2017 10:25:27 [name]  ([id number]) -------------------------------------------------------------------------------- ROS/PFSH Details Patient Name: COREY, CAULFIELD. Date of Service: 01/05/2017 9:15 AM Medical Record Number: [id number] Patient Account Number: [id number] Date of Birth/Sex: August 13, 1945 (72 y.o. Female) Treating RN: Roger Shelter Primary Care Provider: Lelon Huh Other Clinician: Referring Provider: Lelon Huh Treating Provider/Extender: Melburn Hake, HOYT Weeks in Treatment: 13 Information Obtained From Patient Wound History Do you currently have one or more open woundso Yes How many open wounds do you currently haveo 6 Approximately how long have you had your woundso 2 months How have you been treating your wound(s) until nowo [place] Has your wound(s) ever healed and then re-openedo No Have you had any lab work done in the past montho No Have you tested positive for an antibiotic resistant organism (MRSA, VRE)o No Have you tested positive for osteomyelitis (bone infection)o No Have you had any tests for circulation on your legso Yes Where was the test doneo [facility] Have you had other problems associated with your woundso Infection, Swelling Constitutional Symptoms (General Health) Complaints and Symptoms: Negative for: Fever; Chills Cardiovascular Complaints and Symptoms: Positive for: LE edema Medical History: Positive for: Congestive Heart Failure; Coronary Artery Disease; Hypertension Respiratory Complaints and Symptoms: No Complaints or Symptoms Medical History: Positive for: Asthma Endocrine Medical History: Positive for: Type II Diabetes Time with diabetes: a long time Treated with: Oral agents Blood sugar tested every day: No Musculoskeletal LESLE, FARON ([id number]) Medical History: Positive for: Osteoarthritis Psychiatric Complaints and Symptoms: No Complaints or Symptoms Immunizations Pneumococcal Vaccine: Received Pneumococcal Vaccination: Yes Implantable  Devices Family and Social History Cancer: Yes - Mother,Siblings; Diabetes: Yes - Mother,Siblings; Heart Disease: Yes - Mother; Hereditary Spherocytosis: No; Hypertension: Yes - Siblings; Kidney Disease: No; Lung Disease: No; Seizures: No; Stroke: No; Thyroid Problems: No; Tuberculosis: No; Never smoker; Marital Status - Married; Alcohol Use: Never; Drug Use: No History; Caffeine Use: Daily; Financial Concerns: No; Food, Clothing or Shelter Needs: No; Support System Lacking: No; Transportation Concerns: No; Advanced Directives: No; Patient does not want information on Advanced Directives; Do not resuscitate: No; Living Will: No; Medical Power of Attorney: No Physician Affirmation I have reviewed and agree with the above information. Electronic Signature(s) Signed: 01/05/2017 4:40:13 PM By: Roger Shelter Signed: 01/05/2017 7:56:36 PM By: Worthy Keeler PA-C Entered By: Worthy Keeler on 01/05/2017 10:24:26 [name] ([id number]) -------------------------------------------------------------------------------- SuperBill Details Patient Name: ENES, WEGENER B. Date of Service: 01/05/2017 Medical Record Number: [id number] Patient Account Number: [id number] Date of Birth/Sex: 1945/07/29 (72 y.o. 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 Facility Procedures CPT4 Code: 12878676 Description: [place] TISSUE 20 SQ CM/< ICD-10 Diagnosis Description  Z04.045 Non-pressure chronic ulcer of left calf with fat layer expo Modifier: sed Quantity: 1 CPT4  Code: 91368599 Description: 23414 - DEB SUBQ TISS EA ADDL 20CM ICD-10 Diagnosis Description L97.222 Non-pressure chronic ulcer of left calf with fat layer expo Modifier: sed Quantity: 1 Physician Procedures CPT4 Code: 4360165 Description: 80063 - WC PHYS SUBQ TISS 20 SQ CM ICD-10 Diagnosis Description L97.222 Non-pressure chronic ulcer of left calf with fat layer expos Modifier: ed Quantity: 1 CPT4 Code: 4949447 Description: 39584 - WC PHYS SUBQ TISS EA ADDL 20 CM ICD-10 Diagnosis Description L97.222 Non-pressure chronic ulcer of left calf with fat layer expos Modifier: ed Quantity: 1 Electronic Signature(s) Signed: 01/05/2017 3:01:11 PM By: Roger Shelter Signed: 01/05/2017 7:56:36 PM By: Worthy Keeler PA-C Entered By: Roger Shelter on 01/05/2017 15:01:11

## 2017-01-06 NOTE — Progress Notes (Signed)
SINDIA, KOWALCZYK ([id number]) Visit Report for 01/05/2017 Arrival Information Details Patient Name: Jillian, Watson. Date of Service: 01/05/2017 9:15 AM Medical Record Number: [id number] Patient Account Number: [id number] Date of Birth/Sex: 03/20/1945 (72 y.o. Female) Treating RN: Roger Shelter Primary Care Tatum Massman: Lelon Huh Other Clinician: Referring Azarias Chiou: Lelon Huh Treating Lynsey Ange/Extender: Melburn Hake, HOYT Weeks in Treatment: 41 Visit Information History Since Last Visit All ordered tests and consults were completed: No Patient Arrived: Wheel Chair Added or deleted any medications: No Arrival Time: 09:24 Any new allergies or adverse reactions: No Accompanied By: husband Had a fall or experienced change in No Transfer Assistance: [employer] Patient activities of daily living that may affect Lift risk of falls: Patient Identification Verified: Yes Signs or symptoms of abuse/neglect since last visito No Secondary Verification Process Yes Hospitalized since last visit: No Completed: Pain Present Now: No Patient Requires Transmission-Based No Precautions: Patient Has Alerts: Yes Electronic Signature(s) Signed: 01/05/2017 4:40:13 PM By: Roger Shelter Entered By: Roger Shelter on 01/05/2017 09:24:43 [name] ([id number]) -------------------------------------------------------------------------------- Encounter Discharge Information Details Patient Name: Jillian Albe B. Date of Service: 01/05/2017 9:15 AM Medical Record Number: [id number] Patient Account Number: [id number] Date of Birth/Sex: August 01, 1945 (72 y.o. Female) Treating RN: Roger Shelter Primary Care Lynzee Lindquist: Lelon Huh Other Clinician: Referring Brynnan Rodenbaugh: Lelon Huh Treating Hanifa Antonetti/Extender: Melburn Hake, HOYT Weeks in Treatment: 34 Encounter Discharge Information Items Discharge Pain Level: 0 Discharge Condition: Stable Ambulatory Status: Wheelchair Discharge Destination:  Home Transportation: Private Auto Accompanied By: family member Schedule Follow-up Appointment: No Medication Reconciliation completed and No provided to Patient/Care Sadhana Frater: Provided on Clinical Summary of Care: 01/05/2017 Form Type Recipient Paper Patient CS Electronic Signature(s) Signed: 01/05/2017 3:02:02 PM By: Roger Shelter Entered By: Roger Shelter on 01/05/2017 15:02:01 [name] ([id number]) -------------------------------------------------------------------------------- Lower Extremity Assessment Details Patient Name: Jillian Albe B. Date of Service: 01/05/2017 9:15 AM Medical Record Number: [id number] Patient Account Number: [id number] Date of Birth/Sex: April 01, 1945 (72 y.o. Female) Treating RN: Roger Shelter Primary Care Maejor Erven: Lelon Huh Other Clinician: Referring Xzander Gilham: Lelon Huh Treating Capone Schwinn/Extender: Melburn Hake, HOYT Weeks in Treatment: 13 Edema Assessment Assessed: [Left: No] [Right: No] Edema: [Left: Yes] [Right: Yes] Calf Left: Right: Point of Measurement: 30 cm From Medial Instep 37.6 cm 36 cm Ankle Left: Right: Point of Measurement: 10 cm From Medial Instep 24.5 cm 21 cm Vascular Assessment Claudication: Claudication Assessment [Left:None] [Right:None] Pulses: Dorsalis Pedis Palpable: [Left:Yes] [Right:Yes] Posterior Tibial Extremity colors, hair growth, and conditions: Extremity Color: [Left:Hyperpigmented] [Right:Hyperpigmented] Hair Growth on Extremity: [Left:No] [Right:No] Temperature of Extremity: [Left:Cool] [Right:Cool] Capillary Refill: [Left:< 3 seconds] [Right:< 3 seconds] Toe Nail Assessment Left: Right: Thick: Yes Yes Discolored: Yes Yes Deformed: Yes Yes Improper Length and Hygiene: Yes Yes Electronic Signature(s) Signed: 01/05/2017 4:40:13 PM By: Roger Shelter Entered By: Roger Shelter on 01/05/2017 10:18:50 [name]  ([id number]) -------------------------------------------------------------------------------- Multi Wound Chart Details Patient Name: Jillian Albe B. Date of Service: 01/05/2017 9:15 AM Medical Record Number: [id number] Patient Account Number: [id number] Date of Birth/Sex: 1945-04-12 (72 y.o. Female) Treating RN: Roger Shelter Primary Care Angelo Caroll: Lelon Huh Other Clinician: Referring Anjalee Cope: Lelon Huh Treating Djimon Lundstrom/Extender: Melburn Hake, HOYT Weeks in Treatment: 13 Vital Signs Height(in): 64 Pulse(bpm): 61 Weight(lbs): 209 Blood Pressure(mmHg): 148/80 Body Mass Index(BMI): 36 Temperature(F): 97.8 Respiratory Rate 16 (breaths/min): Photos: [1:No Photos] [10:No Photos] [N/A:N/A] Wound Location: [1:Left, Circumferential Lower Leg] [10:Left Lower Leg - Medial, Anterior] [N/A:N/A] Wounding Event: [1:Gradually Appeared] [10:Gradually Appeared] [N/A:N/A] Primary Etiology: [1:Diabetic Wound/Ulcer of the Lower Extremity] [10:Lymphedema] [N/A:N/A] Comorbid History: [1:N/A] [  10:Asthma, Congestive Heart Failure, Coronary Artery Disease, Hypertension, Type II Diabetes, Osteoarthritis] [N/A:N/A] Date Acquired: [1:08/02/2016] [10:12/11/2016] [N/A:N/A] Weeks of Treatment: [1:13] [10:0] [N/A:N/A] Wound Status: [1:Open] [10:Open] [N/A:N/A] Measurements L x W x D [1:6.5x4.6x0.3] [10:3.1x3.4x0.2] [N/A:N/A] (cm) Area (cm) : [1:23.483] [10:8.278] [N/A:N/A] Volume (cm) : [1:7.045] [10:1.656] [N/A:N/A] % Reduction in Area: [1:-35.90%] [10:0.00%] [N/A:N/A] % Reduction in Volume: [1:-307.70%] [10:0.00%] [N/A:N/A] Classification: [1:Grade 2] [10:Full Thickness Without Exposed Support Structures] [N/A:N/A] Exudate Amount: [1:N/A] [10:Large] [N/A:N/A] Exudate Type: [1:N/A] [10:Serosanguineous] [N/A:N/A] Exudate Color: [1:N/A] [10:red, brown] [N/A:N/A] Wound Margin: [1:N/A] [10:Distinct, outline attached] [N/A:N/A] Granulation Amount: [1:N/A] [10:Medium (34-66%)] [N/A:N/A] Granulation  Quality: [1:N/A] [10:Red, Hyper-granulation] [N/A:N/A] Necrotic Amount: [1:N/A] [10:Medium (34-66%)] [N/A:N/A] Epithelialization: [1:N/A] [10:None] [N/A:N/A] Periwound Skin Texture: [1:No Abnormalities Noted] [10:Excoriation: Yes Induration: Yes Callus: No Crepitus: No Rash: No Scarring: No] [N/A:N/A] Periwound Skin Moisture: [1:No Abnormalities Noted] [10:Maceration: No Dry/Scaly: No] [N/A:N/A] Periwound Skin Color: No Abnormalities Noted Atrophie Blanche: No N/A Cyanosis: No Ecchymosis: No Erythema: No Hemosiderin Staining: No Mottled: No Pallor: No Rubor: No Temperature: N/A No Abnormality N/A Tenderness on Palpation: No Yes N/A Wound Preparation: N/A Ulcer Cleansing: N/A Rinsed/Irrigated with Saline Topical Anesthetic Applied: Other: lidocaine 4% Treatment Notes Electronic Signature(s) Signed: 01/05/2017 4:40:13 PM By: Roger Shelter Entered By: Roger Shelter on 01/05/2017 10:10:58 [name] ([id number]) -------------------------------------------------------------------------------- [place] Details Patient Name: [name], [name]. Date of Service: 01/05/2017 9:15 AM Medical Record Number: [id number] Patient Account Number: [id number] Date of Birth/Sex: 02-26-1945 (72 y.o. Female) Treating RN: Roger Shelter Primary Care Shanee Batch: Lelon Huh Other Clinician: Referring Taijuan Serviss: Lelon Huh Treating Tommy Goostree/Extender: Melburn Hake, HOYT Weeks in Treatment: 82 Active Inactive ` Abuse / Safety / Falls / Self Care Management Nursing Diagnoses: History of Falls Potential for falls Goals: Patient will not experience any injury related to falls Date Initiated: 10/02/2016 Target Resolution Date: 01/06/2017 Goal Status: Active Interventions: Assess: immobility, friction, shearing, incontinence upon admission and as needed Assess impairment of mobility on admission and as needed per policy Assess personal safety and home safety (as indicated) on  admission and as needed Notes: ` Nutrition Nursing Diagnoses: Imbalanced nutrition Impaired glucose control: actual or potential Potential for alteratiion in Nutrition/Potential for imbalanced nutrition Goals: Patient/caregiver agrees to and verbalizes understanding of need to use nutritional supplements and/or vitamins as prescribed Date Initiated: 10/02/2016 Target Resolution Date: 02/10/2017 Goal Status: Active Patient/caregiver will maintain therapeutic glucose control Date Initiated: 10/02/2016 Target Resolution Date: 01/06/2017 Goal Status: Active Interventions: Assess patient nutrition upon admission and as needed per policy Provide education on elevated blood sugars and impact on wound healing Provide education on nutrition Treatment Activities: Education provided on Nutrition : 10/02/2016 [name], [name] ([id number]) Notes: ` Orientation to the Wound Care Program Nursing Diagnoses: Knowledge deficit related to the wound healing center program Goals: Patient/caregiver will verbalize understanding of the [place] Date Initiated: 10/02/2016 Target Resolution Date: 11/11/2016 Goal Status: Active Interventions: Provide education on orientation to the wound center Notes: ` Pain, Acute or Chronic Nursing Diagnoses: Pain, acute or chronic: actual or potential Potential alteration in comfort, pain Goals: Patient/caregiver will verbalize adequate pain control between visits Date Initiated: 10/02/2016 Target Resolution Date: 02/10/2017 Goal Status: Active Interventions: Complete pain assessment as per visit requirements Notes: ` Wound/Skin Impairment Nursing Diagnoses: Impaired tissue integrity Knowledge deficit related to ulceration/compromised skin integrity Goals: Ulcer/skin breakdown will have a volume reduction of 80% by week 12 Date Initiated: 10/02/2016 Target Resolution Date: 02/03/2017 Goal Status: Active Interventions: Assess patient/caregiver  ability to perform ulcer/skin  care regimen upon admission and as needed Assess ulceration(s) every visit Notes: [name], [name] ([id number]) Electronic Signature(s) Signed: 01/05/2017 4:40:13 PM By: Roger Shelter Entered By: Roger Shelter on 01/05/2017 10:10:49 [name] ([id number]) -------------------------------------------------------------------------------- Pain Assessment Details Patient Name: [name], Jillian B. Date of Service: 01/05/2017 9:15 AM Medical Record Number: [id number] Patient Account Number: [id number] Date of Birth/Sex: 1945/01/22 (72 y.o. Female) Treating RN: Roger Shelter Primary Care Zollie Ellery: Lelon Huh Other Clinician: Referring Annina Piotrowski: Lelon Huh Treating Heiley Shaikh/Extender: Melburn Hake, HOYT Weeks in Treatment: 13 Active Problems Location of Pain Severity and Description of Pain Patient Has Paino No Site Locations Pain Management and Medication Current Pain Management: Electronic Signature(s) Signed: 01/05/2017 4:40:13 PM By: Roger Shelter Entered By: Roger Shelter on 01/05/2017 09:24:51 [name] ([id number]) -------------------------------------------------------------------------------- Patient/Caregiver Education Details Patient Name: [name]. Date of Service: 01/05/2017 9:15 AM Medical Record Number: [id number] Patient Account Number: [id number] Date of Birth/Gender: 30-May-1945 (72 y.o. Female) Treating RN: Roger Shelter Primary Care Physician: Lelon Huh Other Clinician: Referring Physician: Lelon Huh Treating Physician/Extender: Sharalyn Ink in Treatment: 13 Education Assessment Education Provided To: Patient and Caregiver Education Topics Provided Welcome To [facility]: Handouts: Welcome To [facility] Methods: Explain/Verbal Responses: State content correctly Wound Debridement: Handouts: Wound Debridement Methods: Explain/Verbal Responses: State content  correctly Wound/Skin Impairment: Handouts: Caring for Your Ulcer Methods: Explain/Verbal Responses: State content correctly Electronic Signature(s) Signed: 01/05/2017 4:40:13 PM By: Roger Shelter Entered By: Roger Shelter on 01/05/2017 15:02:51 [name] ([id number]) -------------------------------------------------------------------------------- Wound Assessment Details Patient Name: Jillian Albe B. Date of Service: 01/05/2017 9:15 AM Medical Record Number: [id number] Patient Account Number: [id number] Date of Birth/Sex: 02-24-1945 (72 y.o. Female) Treating RN: Roger Shelter Primary Care Adela Esteban: Lelon Huh Other Clinician: Referring Toluwani Ruder: Lelon Huh Treating Latrica Clowers/Extender: Melburn Hake, HOYT Weeks in Treatment: 13 Wound Status Wound Number: 1 Primary Diabetic Wound/Ulcer of the Lower Etiology: Extremity Wound Location: Left, Circumferential Lower Leg Wound Status: Open Wounding Event: Gradually Appeared Date Acquired: 08/02/2016 Weeks Of Treatment: 13 Clustered Wound: No Photos Photo Uploaded By: Roger Shelter on 01/05/2017 16:51:43 Wound Measurements Length: (cm) 6.5 Width: (cm) 4.6 Depth: (cm) 0.3 Area: (cm) 23.483 Volume: (cm) 7.045 % Reduction in Area: -35.9% % Reduction in Volume: -307.7% Wound Description Classification: Grade 2 Periwound Skin Texture Texture Color No Abnormalities Noted: No No Abnormalities Noted: No Moisture No Abnormalities Noted: No Treatment Notes Wound #1 (Left, Circumferential Lower Leg) 1. Cleansed with: Clean wound with Normal Saline 2. Anesthetic Topical Lidocaine 4% cream to wound bed prior to debridement 4. Dressing Applied: [name], [name] ([id number]) Hydrafera Blue 5. Secondary Dressing Applied ABD Pad 7. Secured with 3 Layer Compression System - Bilateral Notes Animator) Signed: 01/05/2017 4:40:13 PM By: Roger Shelter Entered By: Roger Shelter on 01/05/2017  09:37:19 [name] ([id number]) -------------------------------------------------------------------------------- Wound Assessment Details Patient Name: [name], Jillian B. Date of Service: 01/05/2017 9:15 AM Medical Record Number: [id number] Patient Account Number: [id number] Date of Birth/Sex: 01/04/1945 (72 y.o. Female) Treating RN: Roger Shelter Primary Care Marquist Binstock: Lelon Huh Other Clinician: Referring Dianey Suchy: Lelon Huh Treating Seline Enzor/Extender: Melburn Hake, HOYT Weeks in Treatment: 13 Wound Status Wound Number: 10 Primary Lymphedema Etiology: Wound Location: Left Lower Leg - Medial, Anterior Wound Open Wounding Event: Gradually Appeared Status: Date Acquired: 12/11/2016 Comorbid Asthma, Congestive Heart Failure, Coronary Weeks Of Treatment: 0 History: Artery Disease, Hypertension, Type II Diabetes, Clustered Wound: No Osteoarthritis Photos Photo Uploaded By: Roger Shelter on 01/05/2017 16:51:43 Wound Measurements Length: (cm)  3.1 Width: (cm) 3.4 Depth: (cm) 0.2 Area: (cm) 8.278 Volume: (cm) 1.656 % Reduction in Area: 0% % Reduction in Volume: 0% Epithelialization: None Tunneling: No Undermining: No Wound Description Full Thickness Without Exposed Support Classification: Structures Wound Margin: Distinct, outline attached Exudate Large Amount: Exudate Type: Serosanguineous Exudate Color: red, brown Wound Bed Granulation Amount: Medium (34-66%) Exposed Structure Granulation Quality: Red, Hyper-granulation Fascia Exposed: No Necrotic Amount: Medium (34-66%) Fat Layer (Subcutaneous Tissue) Exposed: Yes Necrotic Quality: Adherent Slough Tendon Exposed: No Muscle Exposed: No Joint Exposed: No Bone Exposed: No [name], Jillian B. ([id number]) Periwound Skin Texture Texture Color No Abnormalities Noted: No No Abnormalities Noted: No Callus: No Atrophie Blanche: No Crepitus: No Cyanosis: No Excoriation: Yes Ecchymosis: No Induration:  Yes Erythema: No Rash: No Hemosiderin Staining: No Scarring: No Mottled: No Pallor: No Moisture Rubor: No No Abnormalities Noted: No Dry / Scaly: No Temperature / Pain Maceration: No Temperature: No Abnormality Tenderness on Palpation: Yes Wound Preparation Ulcer Cleansing: Rinsed/Irrigated with Saline Topical Anesthetic Applied: Other: lidocaine 4%, Treatment Notes Wound #10 (Left, Medial, Anterior Lower Leg) 1. Cleansed with: Clean wound with Normal Saline 2. Anesthetic Topical Lidocaine 4% cream to wound bed prior to debridement 4. Dressing Applied: Hydrafera Blue 5. Secondary Dressing Applied ABD Pad 7. Secured with 3 Layer Compression System - Bilateral Notes Animator) Signed: 01/05/2017 4:40:13 PM By: Roger Shelter Entered By: Roger Shelter on 01/05/2017 09:49:21 [name] ([id number]) -------------------------------------------------------------------------------- [place] Details Patient Name: Jillian Albe B. Date of Service: 01/05/2017 9:15 AM Medical Record Number: [id number] Patient Account Number: [id number] Date of Birth/Sex: March 25, 1945 (72 y.o. Female) Treating RN: Roger Shelter Primary Care Lux Meaders: Lelon Huh Other Clinician: Referring Zvi Duplantis: Lelon Huh Treating Vertis Bauder/Extender: Melburn Hake, HOYT Weeks in Treatment: 13 Vital Signs Time Taken: 09:24 Temperature (F): 97.8 Height (in): 64 Pulse (bpm): 61 Weight (lbs): 209 Respiratory Rate (breaths/min): 16 Body Mass Index (BMI): 35.9 Blood Pressure (mmHg): 148/80 Reference Range: 80 - 120 mg / dl Electronic Signature(s) Signed: 01/05/2017 4:40:13 PM By: Roger Shelter Entered By: Roger Shelter on 01/05/2017 09:36:26

## 2017-01-08 DIAGNOSIS — L97222 Non-pressure chronic ulcer of left calf with fat layer exposed: Secondary | ICD-10-CM | POA: Diagnosis not present

## 2017-01-08 DIAGNOSIS — Z7984 Long term (current) use of oral hypoglycemic drugs: Secondary | ICD-10-CM | POA: Diagnosis not present

## 2017-01-08 DIAGNOSIS — I509 Heart failure, unspecified: Secondary | ICD-10-CM | POA: Diagnosis not present

## 2017-01-08 DIAGNOSIS — M1990 Unspecified osteoarthritis, unspecified site: Secondary | ICD-10-CM | POA: Diagnosis not present

## 2017-01-08 DIAGNOSIS — Z79891 Long term (current) use of opiate analgesic: Secondary | ICD-10-CM | POA: Diagnosis not present

## 2017-01-08 DIAGNOSIS — I110 Hypertensive heart disease with heart failure: Secondary | ICD-10-CM | POA: Diagnosis not present

## 2017-01-08 DIAGNOSIS — I429 Cardiomyopathy, unspecified: Secondary | ICD-10-CM | POA: Diagnosis not present

## 2017-01-08 DIAGNOSIS — Z4800 Encounter for change or removal of nonsurgical wound dressing: Secondary | ICD-10-CM | POA: Diagnosis not present

## 2017-01-08 DIAGNOSIS — J45909 Unspecified asthma, uncomplicated: Secondary | ICD-10-CM | POA: Diagnosis not present

## 2017-01-08 DIAGNOSIS — I272 Pulmonary hypertension, unspecified: Secondary | ICD-10-CM | POA: Diagnosis not present

## 2017-01-08 DIAGNOSIS — E11622 Type 2 diabetes mellitus with other skin ulcer: Secondary | ICD-10-CM | POA: Diagnosis not present

## 2017-01-08 DIAGNOSIS — I2510 Atherosclerotic heart disease of native coronary artery without angina pectoris: Secondary | ICD-10-CM | POA: Diagnosis not present

## 2017-01-10 ENCOUNTER — Encounter: Payer: Self-pay | Admitting: *Deleted

## 2017-01-10 DIAGNOSIS — L97522 Non-pressure chronic ulcer of other part of left foot with fat layer exposed: Secondary | ICD-10-CM | POA: Diagnosis not present

## 2017-01-10 DIAGNOSIS — L97512 Non-pressure chronic ulcer of other part of right foot with fat layer exposed: Secondary | ICD-10-CM | POA: Diagnosis not present

## 2017-01-10 DIAGNOSIS — J45909 Unspecified asthma, uncomplicated: Secondary | ICD-10-CM | POA: Diagnosis not present

## 2017-01-10 DIAGNOSIS — I429 Cardiomyopathy, unspecified: Secondary | ICD-10-CM | POA: Diagnosis not present

## 2017-01-10 DIAGNOSIS — E11621 Type 2 diabetes mellitus with foot ulcer: Secondary | ICD-10-CM | POA: Diagnosis not present

## 2017-01-10 DIAGNOSIS — M1990 Unspecified osteoarthritis, unspecified site: Secondary | ICD-10-CM | POA: Diagnosis not present

## 2017-01-10 DIAGNOSIS — E11622 Type 2 diabetes mellitus with other skin ulcer: Secondary | ICD-10-CM | POA: Diagnosis not present

## 2017-01-10 DIAGNOSIS — Z794 Long term (current) use of insulin: Secondary | ICD-10-CM | POA: Diagnosis not present

## 2017-01-10 DIAGNOSIS — Z79891 Long term (current) use of opiate analgesic: Secondary | ICD-10-CM | POA: Diagnosis not present

## 2017-01-10 DIAGNOSIS — I509 Heart failure, unspecified: Secondary | ICD-10-CM | POA: Diagnosis not present

## 2017-01-10 DIAGNOSIS — Z4800 Encounter for change or removal of nonsurgical wound dressing: Secondary | ICD-10-CM | POA: Diagnosis not present

## 2017-01-10 DIAGNOSIS — I110 Hypertensive heart disease with heart failure: Secondary | ICD-10-CM | POA: Diagnosis not present

## 2017-01-10 DIAGNOSIS — I2510 Atherosclerotic heart disease of native coronary artery without angina pectoris: Secondary | ICD-10-CM | POA: Diagnosis not present

## 2017-01-10 DIAGNOSIS — I272 Pulmonary hypertension, unspecified: Secondary | ICD-10-CM | POA: Diagnosis not present

## 2017-01-10 DIAGNOSIS — Z7984 Long term (current) use of oral hypoglycemic drugs: Secondary | ICD-10-CM | POA: Diagnosis not present

## 2017-01-10 DIAGNOSIS — L97222 Non-pressure chronic ulcer of left calf with fat layer exposed: Secondary | ICD-10-CM | POA: Diagnosis not present

## 2017-01-10 LAB — UIFE/LIGHT CHAINS/TP QN, 24-HR UR
% BETA, Urine: 19.4 %
ALBUMIN, U: 34.5 %
ALPHA 1 URINE: 4.2 %
Alpha 2, Urine: 11.1 %
Free Kappa Lt Chains,Ur: 216 mg/L — ABNORMAL HIGH (ref 1.35–24.19)
Free Kappa/Lambda Ratio: 36 — ABNORMAL HIGH (ref 2.04–10.37)
Free Lambda Lt Chains,Ur: 6 mg/L (ref 0.24–6.66)
GAMMA GLOBULIN URINE: 30.8 %
TOTAL PROTEIN, URINE-UPE24: 22.9 mg/dL
TOTAL PROTEIN, URINE-UR/DAY: 252 mg/(24.h) — AB (ref 30–150)
Total Volume: 1100

## 2017-01-12 ENCOUNTER — Encounter: Payer: Medicare Other | Admitting: Physician Assistant

## 2017-01-12 DIAGNOSIS — E11622 Type 2 diabetes mellitus with other skin ulcer: Secondary | ICD-10-CM | POA: Diagnosis not present

## 2017-01-12 DIAGNOSIS — I2510 Atherosclerotic heart disease of native coronary artery without angina pectoris: Secondary | ICD-10-CM | POA: Diagnosis not present

## 2017-01-12 DIAGNOSIS — Z7984 Long term (current) use of oral hypoglycemic drugs: Secondary | ICD-10-CM | POA: Diagnosis not present

## 2017-01-12 DIAGNOSIS — L97822 Non-pressure chronic ulcer of other part of left lower leg with fat layer exposed: Secondary | ICD-10-CM | POA: Diagnosis not present

## 2017-01-12 DIAGNOSIS — I110 Hypertensive heart disease with heart failure: Secondary | ICD-10-CM | POA: Diagnosis not present

## 2017-01-12 DIAGNOSIS — I890 Lymphedema, not elsewhere classified: Secondary | ICD-10-CM | POA: Diagnosis not present

## 2017-01-12 DIAGNOSIS — I428 Other cardiomyopathies: Secondary | ICD-10-CM | POA: Diagnosis not present

## 2017-01-12 DIAGNOSIS — I509 Heart failure, unspecified: Secondary | ICD-10-CM | POA: Diagnosis not present

## 2017-01-12 DIAGNOSIS — L97222 Non-pressure chronic ulcer of left calf with fat layer exposed: Secondary | ICD-10-CM | POA: Diagnosis not present

## 2017-01-14 NOTE — Progress Notes (Addendum)
DEHLIA, KILNER ([id number]) Visit Report for 01/12/2017 Chief Complaint Document Details Patient Name: [name], [name]. Date of Service: 01/12/2017 9:45 AM Medical Record Number: [id number] Patient Account Number: [id number] Date of Birth/Sex: 05/22/1945 (72 y.o. Female) Treating RN: Ahmed Prima Primary Care Provider: Lelon Huh Other Clinician: Referring Provider: Lelon Huh Treating Provider/Extender: Melburn Hake, Arden Tinoco Weeks in Treatment: 14 Information Obtained from: Patient Chief Complaint Patient seen for complaints of Non-Healing Wounds to both lower extremities Electronic Signature(s) Signed: 01/14/2017 5:30:41 PM By: Worthy Keeler PA-C Entered By: Worthy Keeler on 01/12/2017 10:18:33 [name] ([id number]) -------------------------------------------------------------------------------- Debridement Details Patient Name: Jillian Albe B. Date of Service: 01/12/2017 9:45 AM Medical Record Number: [id number] Patient Account Number: [id number] Date of Birth/Sex: 10/30/1945 (72 y.o. Female) Treating RN: Carolyne Fiscal, Debi Primary Care Provider: Lelon Huh Other Clinician: Referring Provider: Lelon Huh Treating Provider/Extender: Melburn Hake, Tawnya Pujol Weeks in Treatment: 14 Debridement Performed for Wound #1 Left,Circumferential Lower Leg Assessment: Performed By: Physician STONE III, Jaelie Aguilera E., PA-C Debridement: Debridement Severity of Tissue Pre Fat layer exposed Debridement: Pre-procedure Verification/Time Yes - 10:24 Out Taken: Start Time: 10:25 Pain Control: Lidocaine 4% Topical Solution Level: Skin/Subcutaneous Tissue Total Area Debrided (L x W): 11 (cm) x 6.5 (cm) = 71.5 (cm) Tissue and other material Viable, Non-Viable, Exudate, Fibrin/Slough, Subcutaneous debrided: Instrument: Curette Bleeding: Minimum Hemostasis Achieved: Pressure End Time: 10:29 Procedural Pain: 8 Post Procedural Pain: 4 Response to Treatment: Procedure was tolerated  well Post Debridement Measurements of Total Wound Length: (cm) 13.5 Width: (cm) 26.2 Depth: (cm) 0.3 Volume: (cm) 83.339 Character of Wound/Ulcer Post Debridement: Requires Further Debridement Severity of Tissue Post Debridement: Fat layer exposed Post Procedure Diagnosis Same as Pre-procedure Electronic Signature(s) Signed: 01/12/2017 4:08:09 PM By: Alric Quan Signed: 01/14/2017 5:30:41 PM By: Worthy Keeler PA-C Entered By: Alric Quan on 01/12/2017 10:30:12 [name] ([id number]) -------------------------------------------------------------------------------- HPI Details Patient Name: Jillian Albe B. Date of Service: 01/12/2017 9:45 AM Medical Record Number: [id number] Patient Account Number: [id number] Date of Birth/Sex: [birth year] (72 y.o. Female) Treating RN: Carolyne Fiscal, Debi Primary Care Provider: Lelon Huh Other Clinician: Referring Provider: Lelon Huh Treating Provider/Extender: Melburn Hake, Ahyan Kreeger Weeks in Treatment: 14 History of Present Illness Location: both lower extremity swelling with ulceration HPI Description: 72 year old patient who sees her PCP Dr. Lelon Huh was recently evaluated 10 days ago for diabetes mellitus, hypertension, CHF and hyperlipidemia. she also was noted to have ulcerations develop in her legs and she has been applying Silvadene dressings locally. In the past she has refused wound care referrals.her cardiologist Dr. Saunders Revel saw her and put her on 40 mg of furosemide daily. last hemoglobin A1c was 7.7%. she was also placed on ciprofloxacin twice daily for 7 days and a urine culture was recommended. past medical history significant for coronary artery disease, diabetes mellitus, nonischemic cardiomyopathy and pulmonary hypertension. She is also status post heart catheterization and coronary angiography, tubal ligation and breast cyst removal in the past. She has never been a smoker. Patient had arterial studies done which  showed bilateral ABIs are artificially elevated due to noncompressible and calcified vessels. Triphasic waveform throughout. Right great toe TBI is elevated while the left is normal. 10/23/2016 -- the patient is rather moribund from several issues including chronic back pain and knee pain and swelling of her legs. The large necrotic area on her left lateral anterior calf was bleeding on touch after washing her leg. There was a spot which needed silver nitrate cauterization and this was done appropriately. 11/06/2016 --  she is again noted to have friable bleeding from the left lower extremity wounds and this again had to be cauterized with silver nitrate to control the bleeding as pressure itself would not do it. 11/20/2016 -- the right lower extremity is completely healed and we have ordered 30-40 mm compression stocking's in both the dural layer and also a pair of juxta lites. 12/18/2016 -- she has not been wearing her compression stockings on her right leg and these have opened out into ulcerations again. Her left lower extremity has circumferential ulcerations now. I believe at this stage I would like to get her venous reflux studies done to make certain that there is no fixable superficial venous reflux. 12/29/2016 -- she has made an excellent recovery having continued with her appropriate doses of diuretics and elevation and exercise. She has not yet received her right lower extremity juxta lites. Her venous duplex study is at the end of January 01/13/16 on evaluation today patient's wounds appeared to be overall doing much better much less hyper granular than previous week's evaluation. The Hydrofera Blue Dressing's to be doing very well. She does tell me that she is having a little bit more discomfort in the posterior aspect of her leg where she has a wound at this point. Fortunately there appears to be no infection. Electronic Signature(s) Signed: 01/14/2017 5:30:41 PM By: Worthy Keeler  PA-C Entered By: Worthy Keeler on 01/12/2017 10:42:43 [name] ([id number]) -------------------------------------------------------------------------------- Physical Exam Details Patient Name: [name], Jillian B. Date of Service: 01/12/2017 9:45 AM Medical Record Number: [id number] Patient Account Number: [id number] Date of Birth/Sex: Sep 25, 1945 (72 y.o. Female) Treating RN: Ahmed Prima Primary Care Provider: Lelon Huh Other Clinician: Referring Provider: Lelon Huh Treating Provider/Extender: Melburn Hake, Tivon Lemoine Weeks in Treatment: 51 Constitutional Well-nourished and well-hydrated in no acute distress. Respiratory normal breathing without difficulty. Psychiatric this patient is able to make decisions and demonstrates good insight into disease process. Alert and Oriented x 3. pleasant and cooperative. Notes Patient's wounds do still show some evidence of hyper granular tissue scattered throughout there is slough noted as well mainly in the posterior aspect although this was not debride it today due to the fact that patient is having significant pain at that site. We opted to avoid debridement at that location. I did however debride the anterior portion of the wound and she tolerated this well today. Electronic Signature(s) Signed: 01/14/2017 5:30:41 PM By: Worthy Keeler PA-C Entered By: Worthy Keeler on 01/12/2017 10:43:27 [name] ([id number]) -------------------------------------------------------------------------------- Physician Orders Details Patient Name: KASIYA, BURCK B. Date of Service: 01/12/2017 9:45 AM Medical Record Number: [id number] Patient Account Number: [id number] Date of Birth/Sex: [birth year] (72 y.o. 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 o Cleanse wound with mild soap and water o May Shower, gently pat wound dry prior to applying new dressing. Anesthetic (add to Medication List) Wound #1 Left,Circumferential Lower Leg o Topical Lidocaine 4% cream applied to wound bed prior to debridement (In Clinic Only).  Skin Barriers/Peri-Wound Care Wound #1 Left,Circumferential Lower Leg o Barrier cream Primary Wound Dressing Wound #1 Left,Circumferential Lower Leg o Hydrafera Blue - Please make sure this is Hydrafera Blue Ready Transfer, Pt has a package that it came in make sure you get cover the wound on posterior leg as well Secondary Dressing Wound #1 Left,Circumferential Lower Leg o ABD pad o XtraSorb Dressing Change Frequency Wound #1 Left,Circumferential Lower Leg o Change Dressing Monday, Wednesday, Friday - HHRN to change Monday, Wednesday and Fridays and pt to come to [place] on Fridays. Follow-up Appointments o Return Appointment in 1 week. Edema Control Wound #1 Left,Circumferential Lower Leg JAMAE, TISON ([id number]) o 3 Layer Compression System - Left Lower Extremity - ****Make sure you are only putting a 3-layer wrap on the left leg**** o Patient to wear own compression stockings - Right leg-patient can transition to compressing stockings once the get them from [facility], until then please place tubigrip on pts RIGHT LEG ***DO NOT WRAP*** o Elevate legs to the level of the heart and pump ankles as often as possible o Other: - Please place tubigrip on pts RIGHT  LEG ***DO NOT WRAP*** Additional Orders / Instructions Wound #1 Left,Circumferential Lower Leg o Vitamin A; Vitamin C, Zinc o Increase protein intake. Home Health Wound #1 Left,Circumferential Lower Leg o Independence Visits - **************follow-up on ordering compression stockings***************** o Home Health Nurse may visit PRN to address patientos wound care needs. o FACE TO FACE ENCOUNTER: MEDICARE and MEDICAID PATIENTS: I certify that this patient is under my care and that I had a face-to-face encounter that meets the physician face-to-face encounter requirements with this patient on this date. The encounter with the patient was in whole or in part for the following MEDICAL CONDITION: (primary reason for [place]) MEDICAL NECESSITY: I certify, that based on my findings, NURSING services are a medically necessary home health service. HOME BOUND STATUS: I certify that my clinical findings support that this patient is homebound (i.e., Due to illness or injury, pt requires aid of supportive devices such as crutches, cane, wheelchairs, walkers, the use of special transportation or the assistance of another person to leave their place of residence. There is a normal inability to leave the home and doing so requires considerable and taxing effort. Other absences are for medical reasons / religious services and are infrequent or of short duration when for other reasons). o If current dressing causes regression in wound condition, may D/C ordered dressing product/s and apply Normal Saline Moist Dressing daily until next [place] / Other MD appointment. [place] of regression in wound condition at [phone]. o Please direct any NON-WOUND related issues/requests for orders to patient's Primary Care Physician Patient Medications Allergies: penicillin, hydrochlorothiazide Notifications Medication Indication Start End lidocaine DOSE  1 - topical 4 % cream - 1 cream topical Electronic Signature(s) Signed: 01/12/2017 4:08:09 PM By: Alric Quan Signed: 01/14/2017 5:30:41 PM By: Worthy Keeler PA-C Entered By: Alric Quan on 01/12/2017 11:04:26 [name] ([id number]) -------------------------------------------------------------------------------- Prescription 01/12/2017 Patient Name: Jillian Albe B. Provider: Worthy Keeler PA-C Date of Birth: 20-Sep-1945 NPI#: [id number] Sex: F DEA#: [id number] Phone #: [phone] License #: Patient Address: [facility] [place], [place] [address] [address] [place], [place] [address] [phone] Allergies penicillin hydrochlorothiazide Medication Medication: Route: Strength: Form: lidocaine 4 % topical cream topical 4% cream Class: TOPICAL LOCAL ANESTHETICS Dose: Frequency /  Time: Indication: 1 1 cream topical Number of Refills: Number of Units: 0 Generic Substitution: Start Date: End Date: One Time Use: Substitution Permitted No Note to Pharmacy: Signature(s): Date(s): Electronic Signature(s) Signed: 01/12/2017 4:08:09 PM By: Alric Quan Signed: 01/14/2017 5:30:41 PM By: Worthy Keeler PA-C Entered By: Alric Quan on 01/12/2017 11:04:28 [name] ([id number]) KEMESHA, MOSEY ([id number]) --------------------------------------------------------------------------------  Problem List Details Patient Name: [name], Jillian B. Date of Service: 01/12/2017 9:45 AM Medical Record Number: [id number] Patient Account Number: [id number] Date of Birth/Sex: 1945/03/21 (72 y.o. Female) Treating RN: Ahmed Prima Primary Care Provider: Lelon Huh Other Clinician: Referring Provider: Lelon Huh Treating Provider/Extender: Melburn Hake, Davionne Mastrangelo Weeks in Treatment: 14 Active Problems ICD-10 Encounter Code Description Active Date Diagnosis E11.622  Type 2 diabetes mellitus with other skin ulcer 10/02/2016 Yes I89.0 Lymphedema, not elsewhere classified 10/02/2016 Yes L97.212 Non-pressure chronic ulcer of right calf with fat layer exposed 10/02/2016 Yes L97.222 Non-pressure chronic ulcer of left calf with fat layer exposed 10/02/2016 Yes Inactive Problems Resolved Problems Electronic Signature(s) Signed: 01/14/2017 5:30:41 PM By: Worthy Keeler PA-C Entered By: Worthy Keeler on 01/12/2017 10:18:18 [name] ([id number]) -------------------------------------------------------------------------------- Progress Note Details Patient Name: Jillian Albe B. Date of Service: 01/12/2017 9:45 AM Medical Record Number: [id number] Patient Account Number: [id number] Date of Birth/Sex: 07/28/1945 (72 y.o. Female) Treating RN: Ahmed Prima Primary Care Provider: Lelon Huh Other Clinician: Referring Provider: Lelon Huh Treating Provider/Extender: Melburn Hake, Shahzaib Azevedo Weeks in Treatment: 14 Subjective Chief Complaint Information obtained from Patient Patient seen for complaints of Non-Healing Wounds to both lower extremities History of Present Illness (HPI) The following HPI elements were documented for the patient's wound: Location: both lower extremity swelling with ulceration 72 year old patient who sees her PCP Dr. Lelon Huh was recently evaluated 10 days ago for diabetes mellitus, hypertension, CHF and hyperlipidemia. she also was noted to have ulcerations develop in her legs and she has been applying Silvadene dressings locally. In the past she has refused wound care referrals.her cardiologist Dr. Saunders Revel saw her and put her on 40 mg of furosemide daily. last hemoglobin A1c was 7.7%. she was also placed on ciprofloxacin twice daily for 7 days and a urine culture was recommended. past medical history significant for coronary artery disease, diabetes mellitus, nonischemic cardiomyopathy and pulmonary hypertension. She is also status  post heart catheterization and coronary angiography, tubal ligation and breast cyst removal in the past. She has never been a smoker. Patient had arterial studies done which showed bilateral ABIs are artificially elevated due to noncompressible and calcified vessels. Triphasic waveform throughout. Right great toe TBI is elevated while the left is normal. 10/23/2016 -- the patient is rather moribund from several issues including chronic back pain and knee pain and swelling of her legs. The large necrotic area on her left lateral anterior calf was bleeding on touch after washing her leg. There was a spot which needed silver nitrate cauterization and this was done appropriately. 11/06/2016 -- she is again noted to have friable bleeding from the left lower extremity wounds and this again had to be cauterized with silver nitrate to control the bleeding as pressure itself would not do it. 11/20/2016 -- the right lower extremity is completely healed and we have ordered 30-40 mm compression stocking's in both the dural layer and also a pair of juxta lites. 12/18/2016 -- she has not been wearing her compression stockings on her right leg and these have opened out into ulcerations again. Her left lower extremity has circumferential  ulcerations now. I believe at this stage I would like to get her venous reflux studies done to make certain that there is no fixable superficial venous reflux. 12/29/2016 -- she has made an excellent recovery having continued with her appropriate doses of diuretics and elevation and exercise. She has not yet received her right lower extremity juxta lites. Her venous duplex study is at the end of January 01/13/16 on evaluation today patient's wounds appeared to be overall doing much better much less hyper granular than previous week's evaluation. The Hydrofera Blue Dressing's to be doing very well. She does tell me that she is having a little bit more discomfort in the posterior  aspect of her leg where she has a wound at this point. Fortunately there appears to be no infection. Patient History Information obtained from Patient. Family History [name], [name] ([id number]) [place], Diabetes - Mother,Siblings, Heart Disease - Mother, Hypertension - Siblings, No family history of Hereditary Spherocytosis, Kidney Disease, Lung Disease, Seizures, Stroke, Thyroid Problems, Tuberculosis. Social History Never smoker, Marital Status - Married, Alcohol Use - Never, Drug Use - No History, Caffeine Use - Daily. Review of Systems (ROS) Constitutional Symptoms (General Health) Denies complaints or symptoms of Fever, Chills. Respiratory The patient has no complaints or symptoms. Cardiovascular Complains or has symptoms of LE edema. Psychiatric The patient has no complaints or symptoms. Objective Constitutional Well-nourished and well-hydrated in no acute distress. Vitals Time Taken: 9:50 AM, Height: 64 in, Weight: 209 lbs, BMI: 35.9, Temperature: 97.8 F, Pulse: 57 bpm, Respiratory Rate: 16 breaths/min, Blood Pressure: 142/65 mmHg. Respiratory normal breathing without difficulty. Psychiatric this patient is able to make decisions and demonstrates good insight into disease process. Alert and Oriented x 3. pleasant and cooperative. General Notes: Patient's wounds do still show some evidence of hyper granular tissue scattered throughout there is slough noted as well mainly in the posterior aspect although this was not debride it today due to the fact that patient is having significant pain at that site. We opted to avoid debridement at that location. I did however debride the anterior portion of the wound and she tolerated this well today. Integumentary (Hair, Skin) Wound #1 status is Open. Original cause of wound was Gradually Appeared. The wound is located on the Left,Circumferential Lower Leg. The wound measures 13.5cm length x 26.2cm width x 0.2cm  depth; 277.795cm^2 area and 55.559cm^3 volume. There is no tunneling or undermining noted. There is a large amount of serosanguineous drainage noted. The wound margin is distinct with the outline attached to the wound base. There is large (67-100%) red, friable, hyper - granulation within the wound bed. There is a small (1-33%) amount of necrotic tissue within the wound bed including Adherent Slough. The periwound skin appearance exhibited: Scarring. Periwound temperature was noted as No Abnormality. The periwound has tenderness on palpation. Wound #10 status is Converted. Original cause of wound was Gradually Appeared. The wound is located on the Left,Medial,Anterior Lower Leg. 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 There was a Skin/Subcutaneous Tissue Debridement ([id number]) debridement with total area of 71.5  sq cm performed by STONE III, Mikell Kazlauskas E., PA-C. with the following instrument(s): Curette to remove Viable and Non-Viable tissue/material including Exudate, Fibrin/Slough, and Subcutaneous after achieving pain control using Lidocaine 4% Topical Solution. A time out was conducted at 10:24, prior to the start of the procedure. A Minimum amount of bleeding was controlled with Pressure. The procedure was tolerated well with a pain level of 8 throughout and a pain level of 4 following the procedure. Post Debridement Measurements: 13.5cm length x 26.2cm width x 0.3cm depth; 83.339cm^3 volume. Character of Wound/Ulcer  Post Debridement requires further debridement. Severity of Tissue Post Debridement is: Fat layer exposed. Post procedure Diagnosis Wound #1: Same as Pre-Procedure Plan Wound Cleansing: Wound #1 Left,Circumferential Lower Leg: Clean wound with Normal Saline. Cleanse wound with mild soap and water May Shower, gently pat wound dry prior to applying new dressing. Anesthetic (add to Medication List): Wound #1 Left,Circumferential Lower Leg: Topical Lidocaine 4% cream applied to wound bed prior to debridement (In Clinic Only). Skin Barriers/Peri-Wound Care: Wound #1 Left,Circumferential Lower Leg: Barrier cream Primary Wound Dressing: Wound #1 Left,Circumferential Lower Leg: Hydrafera Blue - Please make sure this is Hydrafera Blue Ready Transfer, Pt has a package that it came in make sure you get cover the wound on posterior leg as well Secondary Dressing: Wound #1 Left,Circumferential Lower Leg: ABD pad [name], [name] ([id number]) Dressing Change Frequency: Wound #1 Left,Circumferential Lower Leg: Change Dressing Monday, Wednesday, Friday - HHRN to change Monday, Wednesday and Fridays and pt to come to [place] on Fridays. Follow-up Appointments: Return Appointment in 1 week. Edema Control: Wound #1 Left,Circumferential Lower Leg: 3 Layer Compression System - Left Lower Extremity - ****Make sure you are only putting a 3-layer wrap on the left leg**** Patient to wear own compression stockings - Right leg-patient can transition to compressing stockings once the get them from [facility], until then please place tubigrip on pts RIGHT LEG ***DO NOT WRAP*** Elevate legs to the level of the heart and pump ankles as often as possible Other: - Please place tubigrip on pts RIGHT LEG ***DO NOT WRAP*** Additional Orders / Instructions: Wound #1 Left,Circumferential Lower Leg: Vitamin A; Vitamin C, Zinc Increase protein intake. Home Health: Wound #1 Left,Circumferential  Lower Leg: Continue Home Health Visits - **************follow-up on ordering compression stockings***************** Home Health Nurse may visit PRN to address patient s wound care needs. FACE TO FACE ENCOUNTER: MEDICARE and MEDICAID PATIENTS: I certify that this patient is under my care and that I had a face-to-face encounter that meets the physician face-to-face encounter requirements with this patient on this date. The encounter with the patient was in whole or in part for the following MEDICAL CONDITION: (primary reason for [place]) MEDICAL NECESSITY: I certify, that based on my findings, NURSING services are a medically necessary home health service. HOME BOUND STATUS: I certify that my clinical findings support that this patient is homebound (i.e., Due to illness or injury, pt requires aid of supportive devices such as crutches, cane, wheelchairs, walkers, the use of special transportation or the assistance of another person to leave their place of residence. There is a normal inability to leave the home and doing so requires considerable and taxing effort. Other absences are for medical reasons / religious services and are infrequent or of short duration when for other reasons). If current dressing causes regression in wound condition, may D/C ordered dressing product/s and apply Normal Saline Moist Dressing daily until next [place] / Other  MD appointment. [place] of regression in wound condition at [phone]. Please direct any NON-WOUND related issues/requests for orders to patient's Primary Care Physician The following medication(s) was prescribed: lidocaine topical 4 % cream 1 1 cream topical was prescribed at facility I'm going to recommend that we continue with the Current wound care measures as this does seem to be progressing nicely. Patient and her husband are in agreement with the plan. We will see if potentially we need or can debride the  posterior aspect of the wound next week but for today I did leave this alone due to her pain. Please see above for specific wound care orders. We will see patient for re-evaluation in 1 week(s) here in the clinic. If anything worsens or changes patient will contact our office for additional recommendations. Electronic Signature(s) Signed: 01/19/2017 9:45:08 AM By: Gretta Cool, BSN, RN, CWS, Kim RN, BSN Signed: 01/24/2017 7:57:29 AM By: Worthy Keeler PA-C Previous Signature: 01/14/2017 5:30:41 PM Version By: Worthy Keeler PA-C Entered By: Gretta Cool BSN, RN, CWS, Kim on 01/19/2017 09:45:07 [name], [name] ([id number]) [name], [name] ([id number]) -------------------------------------------------------------------------------- ROS/PFSH Details Patient Name: [name], [name]. Date of Service: 01/12/2017 9:45 AM Medical Record Number: [id number] Patient Account Number: [id number] Date of Birth/Sex: [birth year] (72 y.o. Female) Treating RN: Carolyne Fiscal, Debi Primary Care Provider: Lelon Huh Other Clinician: Referring Provider: Lelon Huh Treating Provider/Extender: Melburn Hake, Yaden Seith Weeks in Treatment: 14 Information Obtained From Patient Wound History Do you currently have one or more open woundso Yes How many open wounds do you currently haveo 6 Approximately how long have you had your woundso 2 months How have you been treating your wound(s) until nowo [place] Has your wound(s) ever healed and then re-openedo No Have you had any lab work done in the past montho No Have you tested positive for an antibiotic resistant organism (MRSA, VRE)o No Have you tested positive for osteomyelitis (bone infection)o No Have you had any tests for circulation on your legso Yes Where was the test doneo [facility] Have you had other problems associated with your woundso Infection, Swelling Constitutional Symptoms (General Health) Complaints and Symptoms: Negative for: Fever; Chills Cardiovascular Complaints and  Symptoms: Positive for: LE edema Medical History: Positive for: Congestive Heart Failure; Coronary Artery Disease; Hypertension Respiratory Complaints and Symptoms: No Complaints or Symptoms Medical History: Positive for: Asthma Endocrine Medical History: Positive for: Type II Diabetes Time with diabetes: a long time Treated with: Oral agents Blood sugar tested every day: No Musculoskeletal CHAE, SHUSTER ([id number]) Medical History: Positive for: Osteoarthritis Psychiatric Complaints and Symptoms: No Complaints or Symptoms Immunizations Pneumococcal Vaccine: Received Pneumococcal Vaccination: Yes Implantable Devices Family and Social History Cancer: Yes - Mother,Siblings; Diabetes: Yes - Mother,Siblings; Heart Disease: Yes - Mother; Hereditary Spherocytosis: No; Hypertension: Yes - Siblings; Kidney Disease: No; Lung Disease: No; Seizures: No; Stroke: No; Thyroid Problems: No; Tuberculosis: No; Never smoker; Marital Status - Married; Alcohol Use: Never; Drug Use: No History; Caffeine Use: Daily; Financial Concerns: No; Food, Clothing or Shelter Needs: No; Support System Lacking: No; Transportation Concerns: No; Advanced Directives: No; Patient does not want information on Advanced Directives; Do not resuscitate: No; Living Will: No; Medical Power of Attorney: No Physician Affirmation I have reviewed and agree with the above information. Electronic Signature(s) Signed: 01/12/2017 4:08:09 PM By: Alric Quan Signed: 01/14/2017 5:30:41 PM By: Worthy Keeler PA-C Entered By: Worthy Keeler on 01/12/2017 10:43:11 [name] ([id number]) -------------------------------------------------------------------------------- SuperBill Details Patient Name: [name]. Date of  Service: 01/12/2017 Medical Record Number: [id number] Patient Account Number: [id number] Date of Birth/Sex: 1945/02/26 (72 y.o. 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 Facility Procedures CPT4 Code: 92924462 Description: [place] - DEB SUBQ TISSUE 20 SQ CM/< ICD-10 Diagnosis Description L97.222 Non-pressure chronic ulcer of left calf with fat layer expo Modifier: sed Quantity: 1 CPT4 Code: 86381771 Description: 16579 - DEB SUBQ TISS EA ADDL 20CM ICD-10 Diagnosis Description L97.222 Non-pressure chronic ulcer of left calf with fat layer expo Modifier: sed Quantity: 3 Physician Procedures CPT4 Code: 0383338 Description: 32919 - WC PHYS SUBQ TISS 20 SQ CM ICD-10 Diagnosis Description L97.222 Non-pressure chronic ulcer of left calf with fat layer expos Modifier: ed Quantity: 1 CPT4 Code: 1660600 Description: 11045 - WC PHYS SUBQ TISS EA ADDL 20 CM ICD-10 Diagnosis Description L97.222 Non-pressure chronic ulcer of left calf with fat layer expos Modifier: ed Quantity: 3 Electronic Signature(s) Signed: 01/14/2017 5:30:41 PM By: Worthy Keeler PA-C Entered By: Worthy Keeler on 01/12/2017 10:44:20

## 2017-01-14 NOTE — Progress Notes (Signed)
[name] ([id number]) Visit Report for 01/12/2017 Arrival Information Details Patient Name: [name], [name]. Date of Service: 01/12/2017 9:45 AM Medical Record Number: [id number] Patient Account Number: [id number] Date of Birth/Sex: 15-Jul-1945 (72 y.o. Female) Treating RN: Ahmed Prima Primary Care Dmoni Fortson: Lelon Huh Other Clinician: Referring Marrah Vanevery: Lelon Huh Treating Salima Rumer/Extender: Melburn Hake, HOYT Weeks in Treatment: 14 Visit Information History Since Last Visit All ordered tests and consults were completed: No Patient Arrived: Wheel Chair Added or deleted any medications: No Arrival Time: 09:48 Any new allergies or adverse reactions: No Accompanied By: husband Had a fall or experienced change in No Transfer Assistance: EasyP[employer] Patient activities of daily living that may affect Lift risk of falls: Patient Identification Verified: Yes Signs or symptoms of abuse/neglect since last visito No Secondary Verification Process Yes Hospitalized since last visit: No Completed: Has Dressing in Place as Prescribed: Yes Patient Requires Transmission-Based No Precautions: Has Compression in Place as Prescribed: Yes Patient Has Alerts: Yes Pain Present Now: Yes Electronic Signature(s) Signed: 01/12/2017 4:08:09 PM By: Alric Quan Entered By: Alric Quan on 01/12/2017 09:49:27 [name] ([id number]) -------------------------------------------------------------------------------- Encounter Discharge Information Details Patient Name: [name], Jillian B. Date of Service: 01/12/2017 9:45 AM Medical Record Number: [id number] Patient Account Number: [id number] Date of Birth/Sex: October 15, 1945 (72 y.o. Female) Treating RN: Ahmed Prima Primary Care Tarena Gockley: Lelon Huh Other Clinician: Referring Deveney Bayon: Lelon Huh Treating Liyat Faulkenberry/Extender: Melburn Hake, HOYT Weeks in Treatment: 14 Encounter Discharge Information Items Discharge Pain Level:  4 Discharge Condition: Stable Ambulatory Status: Wheelchair Discharge Destination: Home Transportation: Private Auto Accompanied By: husband Schedule Follow-up Appointment: Yes Medication Reconciliation completed and No provided to Patient/Care Avan Gullett: Provided on Clinical Summary of Care: 01/12/2017 Form Type Recipient Paper Patient CS Electronic Signature(s) Signed: 01/12/2017 11:01:05 AM By: Alric Quan Previous Signature: 01/12/2017 10:19:16 AM Version By: Alric Quan Previous Signature: 01/12/2017 10:18:43 AM Version By: Alric Quan Entered By: Alric Quan on 01/12/2017 11:01:05 [name] ([id number]) -------------------------------------------------------------------------------- Lower Extremity Assessment Details Patient Name: KINSLEE, DALPE B. Date of Service: 01/12/2017 9:45 AM Medical Record Number: [id number] Patient Account Number: [id number] Date of Birth/Sex: 1945/08/03 (72 y.o. Female) Treating RN: Carolyne Fiscal, Debi Primary Care Keyosha Tiedt: Lelon Huh Other Clinician: Referring Ladajah Soltys: Lelon Huh Treating Raegan Winders/Extender: Melburn Hake, HOYT Weeks in Treatment: 14 Edema Assessment Assessed: [Left: No] [Right: No] Edema: [Left: Yes] [Right: No] Calf Left: Right: Point of Measurement: 30 cm From Medial Instep 41 cm 39 cm Ankle Left: Right: Point of Measurement: 10 cm From Medial Instep 23.6 cm 19.5 cm Vascular Assessment Pulses: Dorsalis Pedis Palpable: [Left:No] [Right:No] Doppler Audible: [Left:Yes] [Right:Yes] Posterior Tibial Extremity colors, hair growth, and conditions: Extremity Color: [Left:Hyperpigmented] [Right:Hyperpigmented] Temperature of Extremity: [Left:Warm] [Right:Warm] Capillary Refill: [Left:< 3 seconds] [Right:< 3 seconds] Toe Nail Assessment Left: Right: Thick: Yes Yes Discolored: Yes Yes Deformed: No No Improper Length and Hygiene: Yes Yes Electronic Signature(s) Signed: 01/12/2017 4:08:09 PM By:  Alric Quan Entered By: Alric Quan on 01/12/2017 10:07:15 [name] ([id number]) -------------------------------------------------------------------------------- Multi Wound Chart Details Patient Name: [name] B. Date of Service: 01/12/2017 9:45 AM Medical Record Number: [id number] Patient Account Number: [id number] Date of Birth/Sex: 12-27-1945 (72 y.o. Female) Treating RN: Ahmed Prima Primary Care Joseh Sjogren: Lelon Huh Other Clinician: Referring Jemia Fata: Lelon Huh Treating Tranell Wojtkiewicz/Extender: Melburn Hake, HOYT Weeks in Treatment: 14 Vital Signs Height(in): 64 Pulse(bpm): 59 Weight(lbs): 209 Blood Pressure(mmHg): 142/65 Body Mass Index(BMI): 36 Temperature(F): 97.8 Respiratory Rate 16 (breaths/min): Photos: [1:No Photos] [10:No Photos] [N/A:N/A] Wound Location: [1:Left Lower Leg - Circumfernential] [10:Left, Medial,  Anterior Lower Leg] [N/A:N/A] Wounding Event: [1:Gradually Appeared] [10:Gradually Appeared] [N/A:N/A] Primary Etiology: [1:Diabetic Wound/Ulcer of the Lymphedema Lower Extremity] [N/A:N/A] Comorbid History: [1:Asthma, Congestive Heart Failure, Coronary Artery Disease, Hypertension, Type II Diabetes, Osteoarthritis] [10:N/A] [N/A:N/A] Date Acquired: [1:08/02/2016] [10:12/11/2016] [N/A:N/A] Weeks of Treatment: [1:14] [10:1] [N/A:N/A] Wound Status: [1:Open] [10:Converted] [N/A:N/A] Measurements L x W x D [1:13.5x26.2x0.2] [10:N/A] [N/A:N/A] (cm) Area (cm) : [1:277.795] [10:N/A] [N/A:N/A] Volume (cm) : [1:55.559] [10:N/A] [N/A:N/A] % Reduction in Area: [1:-1507.70%] [10:N/A] [N/A:N/A] % Reduction in Volume: [1:-3115.20%] [10:N/A] [N/A:N/A] Classification: [1:Grade 2] [10:Full Thickness Without Exposed Support Structures] [N/A:N/A] Exudate Amount: [1:Large] [10:N/A] [N/A:N/A] Exudate Type: [1:Serosanguineous] [10:N/A] [N/A:N/A] Exudate Color: [1:red, brown] [10:N/A] [N/A:N/A] Wound Margin: [1:Distinct, outline attached] [10:N/A]  [N/A:N/A] Granulation Amount: [1:Large (67-100%)] [10:N/A] [N/A:N/A] Granulation Quality: [1:Red, Hyper-granulation, Friable] [10:N/A] [N/A:N/A] Necrotic Amount: [1:Small (1-33%)] [10:N/A] [N/A:N/A] Exposed Structures: [1:Fascia: No Fat Layer (Subcutaneous Tissue) Exposed: No Tendon: No Muscle: No Joint: No Bone: No] [10:N/A] [N/A:N/A] Epithelialization: [1:None] [10:N/A] [N/A:N/A] Periwound Skin Texture: Scarring: Yes No Abnormalities Noted N/A Periwound Skin Moisture: No Abnormalities Noted No Abnormalities Noted N/A Periwound Skin Color: No Abnormalities Noted No Abnormalities Noted N/A Temperature: No Abnormality N/A N/A Tenderness on Palpation: Yes No N/A Wound Preparation: Ulcer Cleansing: N/A N/A Rinsed/Irrigated with Saline, Other: soap and water Topical Anesthetic Applied: Other: lidocaine 4% Treatment Notes Electronic Signature(s) Signed: 01/12/2017 10:18:21 AM By: Alric Quan Entered By: Alric Quan on 01/12/2017 10:18:20 [name] ([id number]) -------------------------------------------------------------------------------- [place] Details Patient Name: [name], CAPSHAW. Date of Service: 01/12/2017 9:45 AM Medical Record Number: [id number] Patient Account Number: [id number] Date of Birth/Sex: October 02, 1945 (72 y.o. Female) Treating RN: Carolyne Fiscal, Debi Primary Care Sherill Wegener: Lelon Huh Other Clinician: Referring Eural Holzschuh: Lelon Huh Treating Stetson Pelaez/Extender: Melburn Hake, HOYT Weeks in Treatment: 14 Active Inactive ` Abuse / Safety / Falls / Self Care Management Nursing Diagnoses: History of Falls Potential for falls Goals: Patient will not experience any injury related to falls Date Initiated: 10/02/2016 Target Resolution Date: 01/06/2017 Goal Status: Active Interventions: Assess: immobility, friction, shearing, incontinence upon admission and as needed Assess impairment of mobility on admission and as needed per policy Assess  personal safety and home safety (as indicated) on admission and as needed Notes: ` Nutrition Nursing Diagnoses: Imbalanced nutrition Impaired glucose control: actual or potential Potential for alteratiion in Nutrition/Potential for imbalanced nutrition Goals: Patient/caregiver agrees to and verbalizes understanding of need to use nutritional supplements and/or vitamins as prescribed Date Initiated: 10/02/2016 Target Resolution Date: 02/10/2017 Goal Status: Active Patient/caregiver will maintain therapeutic glucose control Date Initiated: 10/02/2016 Target Resolution Date: 01/06/2017 Goal Status: Active Interventions: Assess patient nutrition upon admission and as needed per policy Provide education on elevated blood sugars and impact on wound healing Provide education on nutrition Treatment Activities: Education provided on Nutrition : 10/02/2016 [name], [name] ([id number]) Notes: ` Orientation to the Wound Care Program Nursing Diagnoses: Knowledge deficit related to the wound healing center program Goals: Patient/caregiver will verbalize understanding of the [place] Date Initiated: 10/02/2016 Target Resolution Date: 11/11/2016 Goal Status: Active Interventions: Provide education on orientation to the wound center Notes: ` Pain, Acute or Chronic Nursing Diagnoses: Pain, acute or chronic: actual or potential Potential alteration in comfort, pain Goals: Patient/caregiver will verbalize adequate pain control between visits Date Initiated: 10/02/2016 Target Resolution Date: 02/10/2017 Goal Status: Active Interventions: Complete pain assessment as per visit requirements Notes: ` Wound/Skin Impairment Nursing Diagnoses: Impaired tissue integrity Knowledge deficit related to ulceration/compromised skin integrity Goals: Ulcer/skin breakdown will have a volume reduction  of 80% by week 12 Date Initiated: 10/02/2016 Target Resolution Date: 02/03/2017 Goal  Status: Active Interventions: Assess patient/caregiver ability to perform ulcer/skin care regimen upon admission and as needed Assess ulceration(s) every visit Notes: [name], [name] ([id number]) Electronic Signature(s) Signed: 01/12/2017 10:18:13 AM By: Alric Quan Entered By: Alric Quan on 01/12/2017 10:18:11 [name] ([id number]) -------------------------------------------------------------------------------- Pain Assessment Details Patient Name: [name], Jillian B. Date of Service: 01/12/2017 9:45 AM Medical Record Number: [id number] Patient Account Number: [id number] Date of Birth/Sex: [birth year] (72 y.o. Female) Treating RN: Carolyne Fiscal, Debi Primary Care Adger Cantera: Lelon Huh Other Clinician: Referring Andreu Drudge: Lelon Huh Treating Traeger Sultana/Extender: Melburn Hake, HOYT Weeks in Treatment: 14 Active Problems Location of Pain Severity and Description of Pain Patient Has Paino Yes Site Locations Pain Location: Pain in Ulcers With Dressing Change: No Rate the pain. Current Pain Level: 8 Character of Pain Describe the Pain: Aching, Burning Pain Management and Medication Current Pain Management: Electronic Signature(s) Signed: 01/12/2017 4:08:09 PM By: Alric Quan Entered By: Alric Quan on 01/12/2017 09:50:01 [name] ([id number]) -------------------------------------------------------------------------------- Patient/Caregiver Education Details Patient Name: [name]. Date of Service: 01/12/2017 9:45 AM Medical Record Number: [id number] Patient Account Number: [id number] Date of Birth/Gender: August 25, 1945 (72 y.o. Female) Treating RN: Ahmed Prima Primary Care Physician: Lelon Huh Other Clinician: Referring Physician: Lelon Huh Treating Physician/Extender: Sharalyn Ink in Treatment: 14 Education Assessment Education Provided To: Patient Education Topics Provided Wound/Skin Impairment: Handouts: Caring for Your  Ulcer, Other: change dressing as ordered Methods: Demonstration, Explain/Verbal Responses: State content correctly Electronic Signature(s) Signed: 01/12/2017 4:08:09 PM By: Alric Quan Entered By: Alric Quan on 01/12/2017 10:18:58 [name] ([id number]) -------------------------------------------------------------------------------- Wound Assessment Details Patient Name: [name] B. Date of Service: 01/12/2017 9:45 AM Medical Record Number: [id number] Patient Account Number: [id number] Date of Birth/Sex: Nov 25, 1945 (72 y.o. Female) Treating RN: Carolyne Fiscal, Debi Primary Care Amreen Raczkowski: Lelon Huh Other Clinician: Referring Lorina Duffner: Lelon Huh Treating Jaselynn Tamas/Extender: Melburn Hake, HOYT Weeks in Treatment: 14 Wound Status Wound Number: 1 Primary Diabetic Wound/Ulcer of the Lower Extremity Etiology: Wound Location: Left Lower Leg - Circumfernential Wound Open Wounding Event: Gradually Appeared Status: Date Acquired: 08/02/2016 Comorbid Asthma, Congestive Heart Failure, Coronary Weeks Of Treatment: 14 History: Artery Disease, Hypertension, Type II Diabetes, Clustered Wound: No Osteoarthritis Photos Photo Uploaded By: Alric Quan on 01/12/2017 17:19:56 Wound Measurements Length: (cm) 13.5 Width: (cm) 26.2 Depth: (cm) 0.2 Area: (cm) 277.795 Volume: (cm) 55.559 % Reduction in Area: -1507.7% % Reduction in Volume: -3115.2% Epithelialization: None Tunneling: No Undermining: No Wound Description Classification: Grade 2 Wound Margin: Distinct, outline attached Exudate Amount: Large Exudate Type: Serosanguineous Exudate Color: red, brown Foul Odor After Cleansing: No Slough/Fibrino Yes Wound Bed Granulation Amount: Large (67-100%) Exposed Structure Granulation Quality: Red, Hyper-granulation, Friable Fascia Exposed: No Necrotic Amount: Small (1-33%) Fat Layer (Subcutaneous Tissue) Exposed: No Necrotic Quality: Adherent Slough Tendon  Exposed: No Muscle Exposed: No Joint Exposed: No Bone Exposed: No Periwound Skin Texture [name], Jillian B. ([id number]) Texture Color No Abnormalities Noted: No No Abnormalities Noted: No Scarring: Yes Temperature / Pain Moisture Temperature: No Abnormality No Abnormalities Noted: No Tenderness on Palpation: Yes Wound Preparation Ulcer Cleansing: Rinsed/Irrigated with Saline, Other: soap and water, Topical Anesthetic Applied: Other: lidocaine 4%, Treatment Notes Wound #1 (Left, Circumferential Lower Leg) 1. Cleansed with: Clean wound with Normal Saline Cleanse wound with antibacterial soap and water 2. Anesthetic Topical Lidocaine 4% cream to wound bed prior to debridement 3. Peri-wound Care: Barrier cream 4. Dressing Applied: Hydrafera Blue 5. Secondary Dressing Applied  ABD Pad 7. Secured with Tape 3 Layer Compression System - Left Lower Extremity Notes xrasorb Electronic Signature(s) Signed: 01/12/2017 4:08:09 PM By: Alric Quan Entered By: Alric Quan on 01/12/2017 10:09:32 [name] ([id number]) -------------------------------------------------------------------------------- Wound Assessment Details Patient Name: [name], Jillian B. Date of Service: 01/12/2017 9:45 AM Medical Record Number: [id number] Patient Account Number: [id number] Date of Birth/Sex: 06/29/1945 (72 y.o. Female) Treating RN: Ahmed Prima Primary Care Lovelyn Sheeran: Lelon Huh Other Clinician: Referring Cambrey Lupi: Lelon Huh Treating Hermelinda Diegel/Extender: Melburn Hake, HOYT Weeks in Treatment: 14 Wound Status Wound Number: 10 Primary Etiology: Lymphedema Wound Location: Left, Medial, Anterior Lower Leg Wound Status: Converted Wounding Event: Gradually Appeared Date Acquired: 12/11/2016 Weeks Of Treatment: 1 Clustered Wound: No Wound Description Full Thickness Without Exposed Support Classification: Structures Periwound Skin Texture Texture Color No Abnormalities Noted: No No  Abnormalities Noted: No Moisture No Abnormalities Noted: No Electronic Signature(s) Signed: 01/12/2017 4:08:09 PM By: Alric Quan Entered By: Alric Quan on 01/12/2017 10:07:44 [name] ([id number]) -------------------------------------------------------------------------------- Vitals Details Patient Name: [name] B. Date of Service: 01/12/2017 9:45 AM Medical Record Number: [id number] Patient Account Number: [id number] Date of Birth/Sex: 12/31/1945 (72 y.o. Female) Treating RN: Carolyne Fiscal, Debi Primary Care Eliyas Suddreth: Lelon Huh Other Clinician: Referring Everett Ricciardelli: Lelon Huh Treating Yared Susan/Extender: Melburn Hake, HOYT Weeks in Treatment: 14 Vital Signs Time Taken: 09:50 Temperature (F): 97.8 Height (in): 64 Pulse (bpm): 57 Weight (lbs): 209 Respiratory Rate (breaths/min): 16 Body Mass Index (BMI): 35.9 Blood Pressure (mmHg): 142/65 Reference Range: 80 - 120 mg / dl Electronic Signature(s) Signed: 01/12/2017 4:08:09 PM By: Alric Quan Entered By: Alric Quan on 01/12/2017 09:53:45

## 2017-01-15 DIAGNOSIS — I2510 Atherosclerotic heart disease of native coronary artery without angina pectoris: Secondary | ICD-10-CM | POA: Diagnosis not present

## 2017-01-15 DIAGNOSIS — L97512 Non-pressure chronic ulcer of other part of right foot with fat layer exposed: Secondary | ICD-10-CM | POA: Diagnosis not present

## 2017-01-15 DIAGNOSIS — J45909 Unspecified asthma, uncomplicated: Secondary | ICD-10-CM | POA: Diagnosis not present

## 2017-01-15 DIAGNOSIS — L97222 Non-pressure chronic ulcer of left calf with fat layer exposed: Secondary | ICD-10-CM | POA: Diagnosis not present

## 2017-01-15 DIAGNOSIS — Z794 Long term (current) use of insulin: Secondary | ICD-10-CM | POA: Diagnosis not present

## 2017-01-15 DIAGNOSIS — I509 Heart failure, unspecified: Secondary | ICD-10-CM | POA: Diagnosis not present

## 2017-01-15 DIAGNOSIS — Z79891 Long term (current) use of opiate analgesic: Secondary | ICD-10-CM | POA: Diagnosis not present

## 2017-01-15 DIAGNOSIS — I110 Hypertensive heart disease with heart failure: Secondary | ICD-10-CM | POA: Diagnosis not present

## 2017-01-15 DIAGNOSIS — E11622 Type 2 diabetes mellitus with other skin ulcer: Secondary | ICD-10-CM | POA: Diagnosis not present

## 2017-01-15 DIAGNOSIS — I429 Cardiomyopathy, unspecified: Secondary | ICD-10-CM | POA: Diagnosis not present

## 2017-01-15 DIAGNOSIS — L97522 Non-pressure chronic ulcer of other part of left foot with fat layer exposed: Secondary | ICD-10-CM | POA: Diagnosis not present

## 2017-01-15 DIAGNOSIS — M1990 Unspecified osteoarthritis, unspecified site: Secondary | ICD-10-CM | POA: Diagnosis not present

## 2017-01-15 DIAGNOSIS — Z4800 Encounter for change or removal of nonsurgical wound dressing: Secondary | ICD-10-CM | POA: Diagnosis not present

## 2017-01-15 DIAGNOSIS — Z7984 Long term (current) use of oral hypoglycemic drugs: Secondary | ICD-10-CM | POA: Diagnosis not present

## 2017-01-15 DIAGNOSIS — E11621 Type 2 diabetes mellitus with foot ulcer: Secondary | ICD-10-CM | POA: Diagnosis not present

## 2017-01-15 DIAGNOSIS — I272 Pulmonary hypertension, unspecified: Secondary | ICD-10-CM | POA: Diagnosis not present

## 2017-01-17 DIAGNOSIS — I429 Cardiomyopathy, unspecified: Secondary | ICD-10-CM | POA: Diagnosis not present

## 2017-01-17 DIAGNOSIS — M1990 Unspecified osteoarthritis, unspecified site: Secondary | ICD-10-CM | POA: Diagnosis not present

## 2017-01-17 DIAGNOSIS — I509 Heart failure, unspecified: Secondary | ICD-10-CM | POA: Diagnosis not present

## 2017-01-17 DIAGNOSIS — J45909 Unspecified asthma, uncomplicated: Secondary | ICD-10-CM | POA: Diagnosis not present

## 2017-01-17 DIAGNOSIS — L97222 Non-pressure chronic ulcer of left calf with fat layer exposed: Secondary | ICD-10-CM | POA: Diagnosis not present

## 2017-01-17 DIAGNOSIS — E11622 Type 2 diabetes mellitus with other skin ulcer: Secondary | ICD-10-CM | POA: Diagnosis not present

## 2017-01-17 DIAGNOSIS — I2510 Atherosclerotic heart disease of native coronary artery without angina pectoris: Secondary | ICD-10-CM | POA: Diagnosis not present

## 2017-01-17 DIAGNOSIS — I272 Pulmonary hypertension, unspecified: Secondary | ICD-10-CM | POA: Diagnosis not present

## 2017-01-17 DIAGNOSIS — I110 Hypertensive heart disease with heart failure: Secondary | ICD-10-CM | POA: Diagnosis not present

## 2017-01-17 DIAGNOSIS — Z4800 Encounter for change or removal of nonsurgical wound dressing: Secondary | ICD-10-CM | POA: Diagnosis not present

## 2017-01-17 DIAGNOSIS — Z7984 Long term (current) use of oral hypoglycemic drugs: Secondary | ICD-10-CM | POA: Diagnosis not present

## 2017-01-17 DIAGNOSIS — Z79891 Long term (current) use of opiate analgesic: Secondary | ICD-10-CM | POA: Diagnosis not present

## 2017-01-18 DIAGNOSIS — I509 Heart failure, unspecified: Secondary | ICD-10-CM | POA: Diagnosis not present

## 2017-01-18 DIAGNOSIS — L97222 Non-pressure chronic ulcer of left calf with fat layer exposed: Secondary | ICD-10-CM | POA: Diagnosis not present

## 2017-01-18 DIAGNOSIS — I272 Pulmonary hypertension, unspecified: Secondary | ICD-10-CM | POA: Diagnosis not present

## 2017-01-18 DIAGNOSIS — I2510 Atherosclerotic heart disease of native coronary artery without angina pectoris: Secondary | ICD-10-CM | POA: Diagnosis not present

## 2017-01-18 DIAGNOSIS — I110 Hypertensive heart disease with heart failure: Secondary | ICD-10-CM | POA: Diagnosis not present

## 2017-01-18 DIAGNOSIS — J45909 Unspecified asthma, uncomplicated: Secondary | ICD-10-CM | POA: Diagnosis not present

## 2017-01-18 DIAGNOSIS — I429 Cardiomyopathy, unspecified: Secondary | ICD-10-CM | POA: Diagnosis not present

## 2017-01-18 DIAGNOSIS — E11622 Type 2 diabetes mellitus with other skin ulcer: Secondary | ICD-10-CM | POA: Diagnosis not present

## 2017-01-19 ENCOUNTER — Encounter: Payer: Medicare Other | Admitting: Physician Assistant

## 2017-01-19 DIAGNOSIS — I110 Hypertensive heart disease with heart failure: Secondary | ICD-10-CM | POA: Diagnosis not present

## 2017-01-19 DIAGNOSIS — I428 Other cardiomyopathies: Secondary | ICD-10-CM | POA: Diagnosis not present

## 2017-01-19 DIAGNOSIS — L97222 Non-pressure chronic ulcer of left calf with fat layer exposed: Secondary | ICD-10-CM | POA: Diagnosis not present

## 2017-01-19 DIAGNOSIS — I509 Heart failure, unspecified: Secondary | ICD-10-CM | POA: Diagnosis not present

## 2017-01-19 DIAGNOSIS — L97822 Non-pressure chronic ulcer of other part of left lower leg with fat layer exposed: Secondary | ICD-10-CM | POA: Diagnosis not present

## 2017-01-19 DIAGNOSIS — E11622 Type 2 diabetes mellitus with other skin ulcer: Secondary | ICD-10-CM | POA: Diagnosis not present

## 2017-01-19 DIAGNOSIS — Z7984 Long term (current) use of oral hypoglycemic drugs: Secondary | ICD-10-CM | POA: Diagnosis not present

## 2017-01-19 DIAGNOSIS — I890 Lymphedema, not elsewhere classified: Secondary | ICD-10-CM | POA: Diagnosis not present

## 2017-01-19 DIAGNOSIS — I2510 Atherosclerotic heart disease of native coronary artery without angina pectoris: Secondary | ICD-10-CM | POA: Diagnosis not present

## 2017-01-22 DIAGNOSIS — I2510 Atherosclerotic heart disease of native coronary artery without angina pectoris: Secondary | ICD-10-CM | POA: Diagnosis not present

## 2017-01-22 DIAGNOSIS — Z79891 Long term (current) use of opiate analgesic: Secondary | ICD-10-CM | POA: Diagnosis not present

## 2017-01-22 DIAGNOSIS — I429 Cardiomyopathy, unspecified: Secondary | ICD-10-CM | POA: Diagnosis not present

## 2017-01-22 DIAGNOSIS — M1990 Unspecified osteoarthritis, unspecified site: Secondary | ICD-10-CM | POA: Diagnosis not present

## 2017-01-22 DIAGNOSIS — I272 Pulmonary hypertension, unspecified: Secondary | ICD-10-CM | POA: Diagnosis not present

## 2017-01-22 DIAGNOSIS — E11622 Type 2 diabetes mellitus with other skin ulcer: Secondary | ICD-10-CM | POA: Diagnosis not present

## 2017-01-22 DIAGNOSIS — Z7984 Long term (current) use of oral hypoglycemic drugs: Secondary | ICD-10-CM | POA: Diagnosis not present

## 2017-01-22 DIAGNOSIS — J45909 Unspecified asthma, uncomplicated: Secondary | ICD-10-CM | POA: Diagnosis not present

## 2017-01-22 DIAGNOSIS — Z4800 Encounter for change or removal of nonsurgical wound dressing: Secondary | ICD-10-CM | POA: Diagnosis not present

## 2017-01-22 DIAGNOSIS — L97222 Non-pressure chronic ulcer of left calf with fat layer exposed: Secondary | ICD-10-CM | POA: Diagnosis not present

## 2017-01-22 DIAGNOSIS — I509 Heart failure, unspecified: Secondary | ICD-10-CM | POA: Diagnosis not present

## 2017-01-22 DIAGNOSIS — I110 Hypertensive heart disease with heart failure: Secondary | ICD-10-CM | POA: Diagnosis not present

## 2017-01-22 NOTE — Progress Notes (Signed)
DREANA, BRITZ ([id number]) Visit Report for 01/19/2017 Chief Complaint Document Details Patient Name: [name], [name]. Date of Service: 01/19/2017 10:15 AM Medical Record Number: [id number] Patient Account Number: [id number] Date of Birth/Sex: 1945-08-02 (72 y.o. Female) Treating RN: Montey Hora Primary Care Provider: Lelon Huh Other Clinician: Referring Provider: Lelon Huh Treating Provider/Extender: Melburn Hake, HOYT Weeks in Treatment: 15 Information Obtained from: Patient Chief Complaint Patient seen for complaints of Non-Healing Wounds to both lower extremities Electronic Signature(s) Signed: 01/22/2017 11:19:49 AM By: Worthy Keeler PA-C Entered By: Worthy Keeler on 01/19/2017 10:35:07 [name] ([id number]) -------------------------------------------------------------------------------- HPI Details Patient Name: [name]. Date of Service: 01/19/2017 10:15 AM Medical Record Number: [id number] Patient Account Number: [id number] Date of Birth/Sex: 19-May-1945 (72 y.o. Female) Treating RN: Montey Hora Primary Care Provider: Lelon Huh Other Clinician: Referring Provider: Lelon Huh Treating Provider/Extender: Melburn Hake, HOYT Weeks in Treatment: 15 History of Present Illness Location: both lower extremity swelling with ulceration HPI Description: 72 year old patient who sees her PCP Dr. Lelon Huh was recently evaluated 10 days ago for diabetes mellitus, hypertension, CHF and hyperlipidemia. she also was noted to have ulcerations develop in her legs and she has been applying Silvadene dressings locally. In the past she has refused wound care referrals.her cardiologist Dr. Saunders Revel saw her and put her on 40 mg of furosemide daily. last hemoglobin A1c was 7.7%. she was also placed on ciprofloxacin twice daily for 7 days and a urine culture was recommended. past medical history significant for coronary artery disease, diabetes mellitus, nonischemic  cardiomyopathy and pulmonary hypertension. She is also status post heart catheterization and coronary angiography, tubal ligation and breast cyst removal in the past. She has never been a smoker. Patient had arterial studies done which showed bilateral ABIs are artificially elevated due to noncompressible and calcified vessels. Triphasic waveform throughout. Right great toe TBI is elevated while the left is normal. 10/23/2016 -- the patient is rather moribund from several issues including chronic back pain and knee pain and swelling of her legs. The large necrotic area on her left lateral anterior calf was bleeding on touch after washing her leg. There was a spot which needed silver nitrate cauterization and this was done appropriately. 11/06/2016 -- she is again noted to have friable bleeding from the left lower extremity wounds and this again had to be cauterized with silver nitrate to control the bleeding as pressure itself would not do it. 11/20/2016 -- the right lower extremity is completely healed and we have ordered 30-40 mm compression stocking's in both the dural layer and also a pair of juxta lites. 12/18/2016 -- she has not been wearing her compression stockings on her right leg and these have opened out into ulcerations again. Her left lower extremity has circumferential ulcerations now. I believe at this stage I would like to get her venous reflux studies done to make certain that there is no fixable superficial venous reflux. 12/29/2016 -- she has made an excellent recovery having continued with her appropriate doses of diuretics and elevation and exercise. She has not yet received her right lower extremity juxta lites. Her venous duplex study is at the end of January 01/13/16 on evaluation today patient's wounds appeared to be overall doing much better much less hyper granular than previous week's evaluation. The Hydrofera Blue Dressing's to be doing very well. She does tell me that  she is having a little bit more discomfort in the posterior aspect of her leg where she has a wound  at this point. Fortunately there appears to be no infection. 01/19/17 on evaluation today patient appears to be doing very well in regard to her bilateral lower extremity swelling and at this point in time her left lower extremity ulcers. Her wounds appear to be doing much better. She has been tolerating the dressing's at this point fortunately she did get the Juxta-Lite compression as of today as well. Nonetheless I am pleased that she has been tolerating everything so well and that her wounds looks so good. In fact she is an excellent granular surface no evidence of slough covering and I do not see any reason for likely debridement today. Especially in regard to the posterior leg. Electronic Signature(s) Signed: 01/22/2017 11:19:49 AM By: Worthy Keeler PA-C Entered By: Worthy Keeler on 01/19/2017 11:01:33 [name] ([id number]) -------------------------------------------------------------------------------- Physical Exam Details Patient Name: [name], Jillian B. Date of Service: 01/19/2017 10:15 AM Medical Record Number: [id number] Patient Account Number: [id number] Date of Birth/Sex: 1945-08-26 (72 y.o. Female) Treating RN: Montey Hora Primary Care Provider: Lelon Huh Other Clinician: Referring Provider: Lelon Huh Treating Provider/Extender: Melburn Hake, HOYT Weeks in Treatment: 15 Constitutional Obese and well-hydrated in no acute distress. Respiratory normal breathing without difficulty. clear to auscultation bilaterally. Cardiovascular regular rate and rhythm with normal S1, S2. 1+ pitting edema of the bilateral lower extremities. Psychiatric this patient is able to make decisions and demonstrates good insight into disease process. Alert and Oriented x 3. pleasant and cooperative. Notes Patient's wound bed appears to be granular would still some hyper granular aspects  although overall it is very clean and healthy appearing extremely pleased with how things have progressed. Electronic Signature(s) Signed: 01/22/2017 11:19:49 AM By: Worthy Keeler PA-C Entered By: Worthy Keeler on 01/19/2017 11:02:46 [name] ([id number]) -------------------------------------------------------------------------------- Physician Orders Details Patient Name: [name] Date of Service: 01/19/2017 10:15 AM Medical Record Number: [id number] Patient Account Number: [id number] Date of Birth/Sex: Sep 07, 1945 (72 y.o. Female) Treating RN: Montey Hora Primary Care Provider: Lelon Huh Other Clinician: Referring Provider: Lelon Huh Treating Provider/Extender: Melburn Hake, HOYT Weeks in Treatment: 15 Verbal / Phone Orders: No Diagnosis Coding ICD-10 Coding Code Description E11.622 Type 2 diabetes mellitus with other skin ulcer I89.0 Lymphedema, not elsewhere classified L97.212 Non-pressure chronic ulcer of right calf with fat layer exposed L97.222 Non-pressure chronic ulcer of left calf with fat layer exposed Wound Cleansing Wound #1 Left,Circumferential Lower Leg o Clean wound with Normal Saline. o Cleanse wound with mild soap and water o May Shower, gently pat wound dry prior to applying new dressing. Anesthetic (add to Medication List) Wound #1 Left,Circumferential Lower Leg o Topical Lidocaine 4% cream applied to wound bed prior to debridement (In Clinic Only). Skin Barriers/Peri-Wound Care Wound #1 Left,Circumferential Lower Leg o Barrier cream Primary Wound Dressing Wound #1 Left,Circumferential Lower Leg o Hydrafera Blue - Please make sure this is Hydrafera Blue Ready Transfer, Pt has a package that it came in make sure you get cover the wound on posterior leg as well Secondary Dressing Wound #1 Left,Circumferential Lower Leg o ABD pad o XtraSorb Dressing Change Frequency Wound #1 Left,Circumferential Lower Leg o Change  Dressing Monday, Wednesday, Friday - HHRN to change Monday, Wednesday and Fridays and pt to come to [facility] on Fridays. Follow-up Appointments o Return Appointment in 1 week. Edema Control Wound #1 Left,Circumferential Lower Leg [name], Jillian B. ([id number]) o 3 Layer Compression System - Left Lower Extremity - ****Make sure you are only putting a 3-layer  wrap on the left leg**** o Patient to wear own Juxtalite/Juzo compression garment. - Please educate patient and spouse about juxtalite application and use o Elevate legs to the level of the heart and pump ankles as often as possible Additional Orders / Instructions Wound #1 Left,Circumferential Lower Leg o Vitamin A; Vitamin C, Zinc o Increase protein intake. Home Health Wound #1 Left,Circumferential Lower Leg o Continue Home Health Visits - Please educate patient and spouse about juxtalite application and use o Home Health Nurse may visit PRN to address patientos wound care needs. o FACE TO FACE ENCOUNTER: MEDICARE and MEDICAID PATIENTS: I certify that this patient is under my care and that I had a face-to-face encounter that meets the physician face-to-face encounter requirements with this patient on this date. The encounter with the patient was in whole or in part for the following MEDICAL CONDITION: (primary reason for [place]) MEDICAL NECESSITY: I certify, that based on my findings, NURSING services are a medically necessary home health service. HOME BOUND STATUS: I certify that my clinical findings support that this patient is homebound (i.e., Due to illness or injury, pt requires aid of supportive devices such as crutches, cane, wheelchairs, walkers, the use of special transportation or the assistance of another person to leave their place of residence. There is a normal inability to leave the home and doing so requires considerable and taxing effort. Other absences are for medical reasons /  religious services and are infrequent or of short duration when for other reasons). o If current dressing causes regression in wound condition, may D/C ordered dressing product/s and apply Normal Saline Moist Dressing daily until next [place] / Other MD appointment. [place] of regression in wound condition at [phone]. o Please direct any NON-WOUND related issues/requests for orders to patient's Primary Care Physician Electronic Signature(s) Signed: 01/19/2017 5:15:44 PM By: Montey Hora Signed: 01/22/2017 11:19:49 AM By: Worthy Keeler PA-C Entered By: Montey Hora on 01/19/2017 10:54:50 [name] ([id number]) -------------------------------------------------------------------------------- Problem List Details Patient Name: MYKAEL, TROTT B. Date of Service: 01/19/2017 10:15 AM Medical Record Number: [id number] Patient Account Number: [id number] Date of Birth/Sex: 02/28/1945 (72 y.o. Female) Treating RN: Montey Hora Primary Care Provider: Lelon Huh Other Clinician: Referring Provider: Lelon Huh Treating Provider/Extender: Melburn Hake, HOYT Weeks in Treatment: 15 Active Problems ICD-10 Encounter Code Description Active Date Diagnosis E11.622 Type 2 diabetes mellitus with other skin ulcer 10/02/2016 Yes I89.0 Lymphedema, not elsewhere classified 10/02/2016 Yes L97.212 Non-pressure chronic ulcer of right calf with fat layer exposed 10/02/2016 Yes L97.222 Non-pressure chronic ulcer of left calf with fat layer exposed 10/02/2016 Yes Inactive Problems Resolved Problems Electronic Signature(s) Signed: 01/22/2017 11:19:49 AM By: Worthy Keeler PA-C Entered By: Worthy Keeler on 01/19/2017 10:34:58 [name] ([id number]) -------------------------------------------------------------------------------- Progress Note Details Patient Name: Jillian Albe B. Date of Service: 01/19/2017 10:15 AM Medical Record Number: [id number] Patient  Account Number: [id number] Date of Birth/Sex: 04-04-1945 (72 y.o. Female) Treating RN: Montey Hora Primary Care Provider: Lelon Huh Other Clinician: Referring Provider: Lelon Huh Treating Provider/Extender: Melburn Hake, HOYT Weeks in Treatment: 15 Subjective Chief Complaint Information obtained from Patient Patient seen for complaints of Non-Healing Wounds to both lower extremities History of Present Illness (HPI) The following HPI elements were documented for the patient's wound: Location: both lower extremity swelling with ulceration 72 year old patient who sees her PCP Dr. Lelon Huh was recently evaluated 10 days ago for diabetes mellitus, hypertension, CHF and hyperlipidemia. she also was noted to  have ulcerations develop in her legs and she has been applying Silvadene dressings locally. In the past she has refused wound care referrals.her cardiologist Dr. Saunders Revel saw her and put her on 40 mg of furosemide daily. last hemoglobin A1c was 7.7%. she was also placed on ciprofloxacin twice daily for 7 days and a urine culture was recommended. past medical history significant for coronary artery disease, diabetes mellitus, nonischemic cardiomyopathy and pulmonary hypertension. She is also status post heart catheterization and coronary angiography, tubal ligation and breast cyst removal in the past. She has never been a smoker. Patient had arterial studies done which showed bilateral ABIs are artificially elevated due to noncompressible and calcified vessels. Triphasic waveform throughout. Right great toe TBI is elevated while the left is normal. 10/23/2016 -- the patient is rather moribund from several issues including chronic back pain and knee pain and swelling of her legs. The large necrotic area on her left lateral anterior calf was bleeding on touch after washing her leg. There was a spot which needed silver nitrate cauterization and this was done appropriately. 11/06/2016 --  she is again noted to have friable bleeding from the left lower extremity wounds and this again had to be cauterized with silver nitrate to control the bleeding as pressure itself would not do it. 11/20/2016 -- the right lower extremity is completely healed and we have ordered 30-40 mm compression stocking's in both the dural layer and also a pair of juxta lites. 12/18/2016 -- she has not been wearing her compression stockings on her right leg and these have opened out into ulcerations again. Her left lower extremity has circumferential ulcerations now. I believe at this stage I would like to get her venous reflux studies done to make certain that there is no fixable superficial venous reflux. 12/29/2016 -- she has made an excellent recovery having continued with her appropriate doses of diuretics and elevation and exercise. She has not yet received her right lower extremity juxta lites. Her venous duplex study is at the end of January 01/13/16 on evaluation today patient's wounds appeared to be overall doing much better much less hyper granular than previous week's evaluation. The Hydrofera Blue Dressing's to be doing very well. She does tell me that she is having a little bit more discomfort in the posterior aspect of her leg where she has a wound at this point. Fortunately there appears to be no infection. 01/19/17 on evaluation today patient appears to be doing very well in regard to her bilateral lower extremity swelling and at this point in time her left lower extremity ulcers. Her wounds appear to be doing much better. She has been tolerating the dressing's at this point fortunately she did get the Juxta-Lite compression as of today as well. Nonetheless I am pleased that she has been tolerating everything so well and that her wounds looks so good. In fact she is an excellent granular surface no evidence of slough covering and I do not see any reason for likely debridement today. Especially in  regard to the posterior [name], Jillian B. ([id number]) leg. Patient History Information obtained from Patient. Family History Cancer - Mother,Siblings, Diabetes - Mother,Siblings, Heart Disease - Mother, Hypertension - Siblings, No family history of Hereditary Spherocytosis, Kidney Disease, Lung Disease, Seizures, Stroke, Thyroid Problems, Tuberculosis. Social History Never smoker, Marital Status - Married, Alcohol Use - Never, Drug Use - No History, Caffeine Use - Daily. Review of Systems (ROS) Constitutional Symptoms (General Health) Denies complaints or symptoms of Fever, Chills.  Respiratory The patient has no complaints or symptoms. Cardiovascular Complains or has symptoms of LE edema. Psychiatric The patient has no complaints or symptoms. Objective Constitutional Obese and well-hydrated in no acute distress. Vitals Time Taken: 10:18 AM, Height: 64 in, Weight: 209 lbs, BMI: 35.9, Temperature: 97.8 F, Pulse: 60 bpm, Respiratory Rate: 18 breaths/min, Blood Pressure: 129/64 mmHg. Respiratory normal breathing without difficulty. clear to auscultation bilaterally. Cardiovascular regular rate and rhythm with normal S1, S2. 1+ pitting edema of the bilateral lower extremities. Psychiatric this patient is able to make decisions and demonstrates good insight into disease process. Alert and Oriented x 3. pleasant and cooperative. General Notes: Patient's wound bed appears to be granular would still some hyper granular aspects although overall it is very clean and healthy appearing extremely pleased with how things have progressed. Integumentary (Hair, Skin) Wound #1 status is Open. Original cause of wound was Gradually Appeared. The wound is located on the Left,Circumferential Lower Leg. The wound measures 7.8cm length x 17.7cm width x 0.1cm depth; 108.432cm^2 area and 10.843cm^3 volume. There is no tunneling or undermining noted. There is a large amount of serosanguineous  drainage [name], Jillian B. ([id number]) noted. The wound margin is distinct with the outline attached to the wound base. There is large (67-100%) red, friable, hyper - granulation within the wound bed. There is a small (1-33%) amount of necrotic tissue within the wound bed including Adherent Slough. The periwound skin appearance exhibited: Scarring. Periwound temperature was noted as No Abnormality. The periwound has tenderness on palpation. 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 Cleanse wound with mild soap and water May Shower, gently pat wound dry prior to applying new dressing. Anesthetic (add to Medication List): Wound #1 Left,Circumferential Lower Leg: Topical Lidocaine 4% cream applied to wound bed prior to debridement (In Clinic Only). Skin Barriers/Peri-Wound Care: Wound #1 Left,Circumferential Lower Leg: Barrier cream Primary Wound Dressing: Wound #1 Left,Circumferential Lower Leg: Hydrafera Blue - Please make sure this is Hydrafera Blue Ready Transfer, Pt has a package that it came in make sure you get cover the wound on posterior leg as well Secondary Dressing: Wound #1 Left,Circumferential Lower Leg: ABD pad XtraSorb Dressing Change Frequency: Wound #1 Left,Circumferential Lower Leg: Change Dressing Monday, Wednesday, Friday - HHRN to change Monday, Wednesday and Fridays and pt to come to [place] on Fridays. Follow-up Appointments: Return Appointment in 1 week. Edema Control: Wound #1 Left,Circumferential Lower Leg: 3 Layer Compression System - Left Lower Extremity - ****Make sure you are only putting a 3-layer wrap on the  left leg**** Patient to wear own Juxtalite/Juzo compression garment. - Please educate patient and spouse about juxtalite application and use Elevate legs to the level of the heart and pump ankles as often as possible Additional Orders / Instructions: Wound #1 Left,Circumferential Lower Leg: [name], [name] ([id number]) Vitamin A; Vitamin C, Zinc Increase protein intake. Home Health: Wound #1 Left,Circumferential Lower Leg: Continue Home Health Visits - Please educate patient and spouse about juxtalite application and use Home Health Nurse may visit PRN to address patient s wound care needs. FACE TO FACE ENCOUNTER: MEDICARE and MEDICAID PATIENTS: I certify that this patient is under my care and that I had a  face-to-face encounter that meets the physician face-to-face encounter requirements with this patient on this date. The encounter with the patient was in whole or in part for the following MEDICAL CONDITION: (primary reason for [place]) MEDICAL NECESSITY: I certify, that based on my findings, NURSING services are a medically necessary home health service. HOME BOUND STATUS: I certify that my clinical findings support that this patient is homebound (i.e., Due to illness or injury, pt requires aid of supportive devices such as crutches, cane, wheelchairs, walkers, the use of special transportation or the assistance of another person to leave their place of residence. There is a normal inability to leave the home and doing so requires considerable and taxing effort. Other absences are for medical reasons / religious services and are infrequent or of short duration when for other reasons). If current dressing causes regression in wound condition, may D/C ordered dressing product/s and apply Normal Saline Moist Dressing daily until next [place] / Other MD appointment. [place] of regression in wound condition at [phone]. Please direct any NON-WOUND  related issues/requests for orders to patient's Primary Care Physician I'm going to recommend that we continue with the Current wound care measures as patient seems to be doing so well. We will apply the Juxta-Lite compression for her today as this also seems to be likely to help her and fortunately it has arrived. We will see were things stand in one weeks time. Please see above for specific wound care orders. We will see patient for re-evaluation in 1 week(s) here in the clinic. If anything worsens or changes patient will contact our office for additional recommendations. Electronic Signature(s) Signed: 01/22/2017 11:19:49 AM By: Worthy Keeler PA-C Entered By: Worthy Keeler on 01/19/2017 11:02:59 [name] ([id number]) -------------------------------------------------------------------------------- ROS/PFSH Details Patient Name: [name] Date of Service: 01/19/2017 10:15 AM Medical Record Number: [id number] Patient Account Number: [id number] Date of Birth/Sex: 10/26/1945 (72 y.o. Female) Treating RN: Montey Hora Primary Care Provider: Lelon Huh Other Clinician: Referring Provider: Lelon Huh Treating Provider/Extender: Melburn Hake, HOYT Weeks in Treatment: 15 Information Obtained From Patient Wound History Do you currently have one or more open woundso Yes How many open wounds do you currently haveo 6 Approximately how long have you had your woundso 2 months How have you been treating your wound(s) until nowo silvadene Has your wound(s) ever healed and then re-openedo No Have you had any lab work done in the past montho No Have you tested positive for an antibiotic resistant organism (MRSA, VRE)o No Have you tested positive for osteomyelitis (bone infection)o No Have you had any tests for circulation on your legso Yes Where was the test [facility]o [facility] Have you had other problems associated with your woundso Infection, Swelling Constitutional Symptoms (General  Health) Complaints and Symptoms: Negative for: Fever; Chills Cardiovascular Complaints and Symptoms: Positive for: LE edema Medical History: Positive for: Congestive Heart Failure; Coronary Artery Disease; Hypertension Respiratory Complaints and Symptoms: No Complaints or Symptoms Medical History: Positive for: Asthma Endocrine Medical History: Positive for: Type II Diabetes Time with diabetes: a long time Treated with: Oral agents Blood sugar tested every day: No Musculoskeletal [name], [name] ([id number]) Medical History: Positive for: Osteoarthritis Psychiatric Complaints and Symptoms: No Complaints or Symptoms Immunizations Pneumococcal Vaccine: Received Pneumococcal Vaccination: Yes Implantable Devices Family and Social History Cancer: Yes - Mother,Siblings; Diabetes: Yes - Mother,Siblings; Heart Disease: Yes - Mother; Hereditary Spherocytosis: No; Hypertension: Yes - Siblings; Kidney Disease: No; Lung Disease: No;  Seizures: No; Stroke: No; Thyroid Problems: No; Tuberculosis: No; Never smoker; Marital Status - Married; Alcohol Use: Never; Drug Use: No History; Caffeine Use: Daily; Financial Concerns: No; Food, Clothing or Shelter Needs: No; Support System Lacking: No; Transportation Concerns: No; Advanced Directives: No; Patient does not want information on Advanced Directives; Do not resuscitate: No; Living Will: No; Medical Power of Attorney: No Physician Affirmation I have reviewed and agree with the above information. Electronic Signature(s) Signed: 01/19/2017 5:15:44 PM By: Montey Hora Signed: 01/22/2017 11:19:49 AM By: Worthy Keeler PA-C Entered By: Worthy Keeler on 01/19/2017 11:01:55 [name] ([id number]) -------------------------------------------------------------------------------- SuperBill Details Patient Name: [name], Jillian B. Date of Service: 01/19/2017 Medical Record Number: [id number] Patient Account Number: [id number] Date of Birth/Sex:  February 01, 1945 (72 y.o. 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 Facility Procedures CPT4 Code: 94801655 Description: (Facility Use Only) [id number] - [place] LWR LT LEG Modifier: Quantity: 1 Physician Procedures CPT4 Code: 7867544 Description: 92010 - WC PHYS LEVEL 3 - EST PT ICD-10 Diagnosis Description E11.622 Type 2 diabetes mellitus with other skin ulcer I89.0 Lymphedema, not elsewhere classified L97.212 Non-pressure chronic ulcer of right calf with fat layer exp L97.222  Non-pressure chronic ulcer of left calf with fat layer expo Modifier: osed sed Quantity: 1 Electronic Signature(s) Signed: 01/19/2017 5:15:44 PM By: Montey Hora Signed: 01/22/2017 11:19:49 AM By: Worthy Keeler PA-C Entered By: Montey Hora on 01/19/2017 11:44:49

## 2017-01-22 NOTE — Progress Notes (Addendum)
[name], [name] ([id number]) Visit Report for 01/19/2017 Arrival Information Details Patient Name: [name], [name]. Date of Service: 01/19/2017 10:15 AM Medical Record Number: [id number] Patient Account Number: [id number] Date of Birth/Sex: [birth year] (72 y.o. Female) Treating RN: Montey Hora Primary Care Camar Guyton: Lelon Huh Other Clinician: Referring Haely Leyland: Lelon Huh Treating Jameka Ivie/Extender: Melburn Hake, HOYT Weeks in Treatment: 15 Visit Information History Since Last Visit Added or deleted any medications: No Patient Arrived: Wheel Chair Any new allergies or adverse reactions: No Arrival Time: 10:16 Had a fall or experienced change in No Accompanied By: spouse activities of daily living that may affect Transfer Assistance: Manual risk of falls: Patient Identification Verified: Yes Signs or symptoms of abuse/neglect since last visito No Secondary Verification Process Yes Hospitalized since last visit: No Completed: Has Dressing in Place as Prescribed: Yes Patient Requires Transmission-Based No Has Compression in Place as Prescribed: Yes Precautions: Pain Present Now: No Patient Has Alerts: Yes Patient Alerts: hemoglobin A1c 6.5% 12/18 Electronic Signature(s) Signed: 01/24/2017 8:08:21 AM By: Roger Shelter Previous Signature: 01/19/2017 5:15:44 PM Version By: Montey Hora Entered By: Roger Shelter on 01/24/2017 08:08:21 [name] ([id number]) -------------------------------------------------------------------------------- Encounter Discharge Information Details Patient Name: [name], [name] Date of Service: 01/19/2017 10:15 AM Medical Record Number: [id number] Patient Account Number: [id number] Date of Birth/Sex: 26-Sep-1945 (72 y.o. Female) Treating RN: Montey Hora Primary Care Aava Deland: Lelon Huh Other Clinician: Referring Sonny Anthes: Lelon Huh Treating Aly Seidenberg/Extender: Melburn Hake, HOYT Weeks in Treatment: 15 Encounter Discharge  Information Items Discharge Pain Level: 0 Discharge Condition: Stable Ambulatory Status: Ambulatory Discharge Destination: Home Transportation: Private Auto Accompanied By: spouse Schedule Follow-up Appointment: Yes Medication Reconciliation completed and No provided to Patient/Care Casimira Sutphin: Provided on Clinical Summary of Care: 01/19/2017 Form Type Recipient Paper Patient CS Electronic Signature(s) Signed: 01/19/2017 5:15:44 PM By: Montey Hora Entered By: Montey Hora on 01/19/2017 11:45:50 [name] ([id number]) -------------------------------------------------------------------------------- Lower Extremity Assessment Details Patient Name: Jillian Albe B. Date of Service: 01/19/2017 10:15 AM Medical Record Number: [id number] Patient Account Number: [id number] Date of Birth/Sex: 1945-01-23 (72 y.o. Female) Treating RN: Montey Hora Primary Care Lucee Brissett: Lelon Huh Other Clinician: Referring Anaisa Radi: Lelon Huh Treating Albeiro Trompeter/Extender: Melburn Hake, HOYT Weeks in Treatment: 15 Edema Assessment Assessed: [Left: No] [Right: No] [Left: Edema] [Right: :] Calf Left: Right: Point of Measurement: 30 cm From Medial Instep 39.6 cm cm Ankle Left: Right: Point of Measurement: 10 cm From Medial Instep 22.5 cm cm Vascular Assessment Pulses: Dorsalis Pedis Palpable: [Left:Yes] Posterior Tibial Extremity colors, hair growth, and conditions: Extremity Color: [Left:Hyperpigmented] Hair Growth on Extremity: [Left:No] Temperature of Extremity: [Left:Warm] Capillary Refill: [Left:< 3 seconds] Electronic Signature(s) Signed: 01/19/2017 5:15:44 PM By: Montey Hora Entered By: Montey Hora on 01/19/2017 10:39:20 [name] ([id number]) -------------------------------------------------------------------------------- Multi Wound Chart Details Patient Name: Jillian Albe B. Date of Service: 01/19/2017 10:15 AM Medical Record Number: [id number] Patient Account  Number: [id number] Date of Birth/Sex: February 24, 1945 (72 y.o. Female) Treating RN: Montey Hora Primary Care Jaleea Alesi: Lelon Huh Other Clinician: Referring Judah Chevere: Lelon Huh Treating Antaeus Karel/Extender: Melburn Hake, HOYT Weeks in Treatment: 15 Vital Signs Height(in): 64 Pulse(bpm): 60 Weight(lbs): 209 Blood Pressure(mmHg): 129/64 Body Mass Index(BMI): 36 Temperature(F): 97.8 Respiratory Rate 18 (breaths/min): Photos: [1:No Photos] [N/A:N/A] Wound Location: [1:Left Lower Leg - Circumfernential] [N/A:N/A] Wounding Event: [1:Gradually Appeared] [N/A:N/A] Primary Etiology: [1:Diabetic Wound/Ulcer of the N/A Lower Extremity] Comorbid History: [1:Asthma, Congestive Heart Failure, Coronary Artery Disease, Hypertension, Type II Diabetes, Osteoarthritis] [N/A:N/A] Date Acquired: [1:08/02/2016] [N/A:N/A] Weeks of Treatment: [1:15] [N/A:N/A] Wound Status: [1:Open] [N/A:N/A] Measurements L  x W x D [1:7.8x17.7x0.1] [N/A:N/A] (cm) Area (cm) : [1:108.432] [N/A:N/A] Volume (cm) : [1:10.843] [N/A:N/A] % Reduction in Area: [1:-527.50%] [N/A:N/A] % Reduction in Volume: [1:-527.50%] [N/A:N/A] Classification: [1:Grade 2] [N/A:N/A] Exudate Amount: [1:Large] [N/A:N/A] Exudate Type: [1:Serosanguineous] [N/A:N/A] Exudate Color: [1:red, brown] [N/A:N/A] Wound Margin: [1:Distinct, outline attached] [N/A:N/A] Granulation Amount: [1:Large (67-100%)] [N/A:N/A] Granulation Quality: [1:Red, Hyper-granulation, Friable] [N/A:N/A] Necrotic Amount: [1:Small (1-33%)] [N/A:N/A] Exposed Structures: [1:Fascia: No Fat Layer (Subcutaneous Tissue) Exposed: No Tendon: No Muscle: No Joint: No Bone: No] [N/A:N/A] Epithelialization: [1:None] [N/A:N/A] Periwound Skin Texture: [1:Scarring: Yes] [N/A:N/A] Periwound Skin Moisture: No Abnormalities Noted N/A N/A Periwound Skin Color: No Abnormalities Noted N/A N/A Temperature: No Abnormality N/A N/A Tenderness on Palpation: Yes N/A N/A Wound Preparation: Ulcer  Cleansing: N/A N/A Rinsed/Irrigated with Saline, Other: soap and water Topical Anesthetic Applied: Other: lidocaine 4% Treatment Notes Electronic Signature(s) Signed: 01/19/2017 5:15:44 PM By: Montey Hora Entered By: Montey Hora on 01/19/2017 10:52:46 [name] ([id number]) -------------------------------------------------------------------------------- [place] Details Patient Name: [name], [name]. Date of Service: 01/19/2017 10:15 AM Medical Record Number: [id number] Patient Account Number: [id number] Date of Birth/Sex: May 26, 1945 (72 y.o. Female) Treating RN: Montey Hora Primary Care Squire Withey: Lelon Huh Other Clinician: Referring Chrystopher Stangl: Lelon Huh Treating Jadaya Sommerfield/Extender: Melburn Hake, HOYT Weeks in Treatment: 15 Active Inactive ` Abuse / Safety / Falls / Self Care Management Nursing Diagnoses: History of Falls Potential for falls Goals: Patient will not experience any injury related to falls Date Initiated: 10/02/2016 Target Resolution Date: 01/06/2017 Goal Status: Active Interventions: Assess: immobility, friction, shearing, incontinence upon admission and as needed Assess impairment of mobility on admission and as needed per policy Assess personal safety and home safety (as indicated) on admission and as needed Notes: ` Nutrition Nursing Diagnoses: Imbalanced nutrition Impaired glucose control: actual or potential Potential for alteratiion in Nutrition/Potential for imbalanced nutrition Goals: Patient/caregiver agrees to and verbalizes understanding of need to use nutritional supplements and/or vitamins as prescribed Date Initiated: 10/02/2016 Target Resolution Date: 02/10/2017 Goal Status: Active Patient/caregiver will maintain therapeutic glucose control Date Initiated: 10/02/2016 Target Resolution Date: 01/06/2017 Goal Status: Active Interventions: Assess patient nutrition upon admission and as needed per policy Provide  education on elevated blood sugars and impact on wound healing Provide education on nutrition Treatment Activities: Education provided on Nutrition : 10/02/2016 [name], [name] ([id number]) Notes: ` Orientation to the Wound Care Program Nursing Diagnoses: Knowledge deficit related to the wound healing center program Goals: Patient/caregiver will verbalize understanding of the [place] Date Initiated: 10/02/2016 Target Resolution Date: 11/11/2016 Goal Status: Active Interventions: Provide education on orientation to the wound center Notes: ` Pain, Acute or Chronic Nursing Diagnoses: Pain, acute or chronic: actual or potential Potential alteration in comfort, pain Goals: Patient/caregiver will verbalize adequate pain control between visits Date Initiated: 10/02/2016 Target Resolution Date: 02/10/2017 Goal Status: Active Interventions: Complete pain assessment as per visit requirements Notes: ` Wound/Skin Impairment Nursing Diagnoses: Impaired tissue integrity Knowledge deficit related to ulceration/compromised skin integrity Goals: Ulcer/skin breakdown will have a volume reduction of 80% by week 12 Date Initiated: 10/02/2016 Target Resolution Date: 02/03/2017 Goal Status: Active Interventions: Assess patient/caregiver ability to perform ulcer/skin care regimen upon admission and as needed Assess ulceration(s) every visit Notes: [name], [name] ([id number]) Electronic Signature(s) Signed: 01/19/2017 5:15:44 PM By: Montey Hora Entered By: Montey Hora on 01/19/2017 10:52:21 [name] ([id number]) -------------------------------------------------------------------------------- Pain Assessment Details Patient Name: Jillian Albe B. Date of Service: 01/19/2017 10:15 AM Medical Record Number: [id number] Patient Account Number: [id number] Date  of Birth/Sex: [birth year] (72 y.o. Female) Treating RN: Montey Hora Primary Care Cayson Kalb: Lelon Huh  Other Clinician: Referring Jarmar Rousseau: Lelon Huh Treating Deannah Rossi/Extender: Melburn Hake, HOYT Weeks in Treatment: 15 Active Problems Location of Pain Severity and Description of Pain Patient Has Paino Yes Site Locations Pain Location: Pain in Ulcers With Dressing Change: Yes Duration of the Pain. Constant / Intermittento Intermittent Pain Management and Medication Current Pain Management: Notes Topical or injectable lidocaine is offered to patient for acute pain when surgical debridement is performed. If needed, Patient is instructed to use over the counter pain medication for the following 24-48 hours after debridement. Wound care MDs do not prescribed pain medications. Patient has chronic pain or uncontrolled pain. Patient has been instructed to make an appointment with their Primary Care Physician for pain management. Electronic Signature(s) Signed: 01/19/2017 5:15:44 PM By: Montey Hora Entered By: Montey Hora on 01/19/2017 10:18:22 [name] ([id number]) -------------------------------------------------------------------------------- Patient/Caregiver Education Details Patient Name: [name] Date of Service: 01/19/2017 10:15 AM Medical Record Number: [id number] Patient Account Number: [id number] Date of Birth/Gender: [birth year] (72 y.o. Female) Treating RN: Montey Hora Primary Care Physician: Lelon Huh Other Clinician: Referring Physician: Lelon Huh Treating Physician/Extender: Sharalyn Ink in Treatment: 15 Education Assessment Education Provided To: Patient and Caregiver Education Topics Provided Venous: Handouts: Other: leg elevation Methods: Explain/Verbal Responses: State content correctly Electronic Signature(s) Signed: 01/19/2017 5:15:44 PM By: Montey Hora Entered By: Montey Hora on 01/19/2017 11:46:13 [name] ([id number]) -------------------------------------------------------------------------------- Wound  Assessment Details Patient Name: [name], Jillian B. Date of Service: 01/19/2017 10:15 AM Medical Record Number: [id number] Patient Account Number: [id number] Date of Birth/Sex: 1945/12/30 (72 y.o. Female) Treating RN: Montey Hora Primary Care Casondra Gasca: Lelon Huh Other Clinician: Referring Joie Hipps: Lelon Huh Treating Whitley Strycharz/Extender: Melburn Hake, HOYT Weeks in Treatment: 15 Wound Status Wound Number: 1 Primary Diabetic Wound/Ulcer of the Lower Extremity Etiology: Wound Location: Left Lower Leg - Circumfernential Wound Open Wounding Event: Gradually Appeared Status: Date Acquired: 08/02/2016 Comorbid Asthma, Congestive Heart Failure, Coronary Weeks Of Treatment: 15 History: Artery Disease, Hypertension, Type II Diabetes, Clustered Wound: No Osteoarthritis Photos Photo Uploaded By: Montey Hora on 01/19/2017 15:40:02 Wound Measurements Length: (cm) 7.8 Width: (cm) 17.7 Depth: (cm) 0.1 Area: (cm) 108.432 Volume: (cm) 10.843 % Reduction in Area: -527.5% % Reduction in Volume: -527.5% Epithelialization: None Tunneling: No Undermining: No Wound Description Classification: Grade 2 Wound Margin: Distinct, outline attached Exudate Amount: Large Exudate Type: Serosanguineous Exudate Color: red, brown Foul Odor After Cleansing: No Slough/Fibrino Yes Wound Bed Granulation Amount: Large (67-100%) Exposed Structure Granulation Quality: Red, Hyper-granulation, Friable Fascia Exposed: No Necrotic Amount: Small (1-33%) Fat Layer (Subcutaneous Tissue) Exposed: No Necrotic Quality: Adherent Slough Tendon Exposed: No Muscle Exposed: No Joint Exposed: No Bone Exposed: No Periwound Skin Texture [name], Jillian B. ([id number]) Texture Color No Abnormalities Noted: No No Abnormalities Noted: No Scarring: Yes Temperature / Pain Moisture Temperature: No Abnormality No Abnormalities Noted: No Tenderness on Palpation: Yes Wound Preparation Ulcer  Cleansing: Rinsed/Irrigated with Saline, Other: soap and water, Topical Anesthetic Applied: Other: lidocaine 4%, Treatment Notes Wound #1 (Left, Circumferential Lower Leg) 1. Cleansed with: Cleanse wound with antibacterial soap and water 2. Anesthetic Topical Lidocaine 4% cream to wound bed prior to debridement 4. Dressing Applied: Hydrafera Blue Other dressing (specify in notes) 5. Secondary Dressing Applied ABD Pad 7. Secured with 3 Layer Compression System - Left Lower Extremity Notes xrasorb Electronic Signature(s) Signed: 01/19/2017 5:15:44 PM By: Montey Hora Entered By: Montey Hora on 01/19/2017 10:37:55 Janowski, Arbie Cookey  BMarland Kitchen ([id number]) -------------------------------------------------------------------------------- Vitals Details Patient Name: [name], CUNLIFFE. Date of Service: 01/19/2017 10:15 AM Medical Record Number: [id number] Patient Account Number: [id number] Date of Birth/Sex: [birth year] (72 y.o. Female) Treating RN: Montey Hora Primary Care Dominque Levandowski: Lelon Huh Other Clinician: Referring Donnelle Olmeda: Lelon Huh Treating Edenilson Austad/Extender: Melburn Hake, HOYT Weeks in Treatment: 15 Vital Signs Time Taken: 10:18 Temperature (F): 97.8 Height (in): 64 Pulse (bpm): 60 Weight (lbs): 209 Respiratory Rate (breaths/min): 18 Body Mass Index (BMI): 35.9 Blood Pressure (mmHg): 129/64 Reference Range: 80 - 120 mg / dl Electronic Signature(s) Signed: 01/19/2017 5:15:44 PM By: Montey Hora Entered By: Montey Hora on 01/19/2017 10:19:02

## 2017-01-24 DIAGNOSIS — L97222 Non-pressure chronic ulcer of left calf with fat layer exposed: Secondary | ICD-10-CM | POA: Diagnosis not present

## 2017-01-24 DIAGNOSIS — I2510 Atherosclerotic heart disease of native coronary artery without angina pectoris: Secondary | ICD-10-CM | POA: Diagnosis not present

## 2017-01-24 DIAGNOSIS — I110 Hypertensive heart disease with heart failure: Secondary | ICD-10-CM | POA: Diagnosis not present

## 2017-01-24 DIAGNOSIS — I272 Pulmonary hypertension, unspecified: Secondary | ICD-10-CM | POA: Diagnosis not present

## 2017-01-24 DIAGNOSIS — Z79891 Long term (current) use of opiate analgesic: Secondary | ICD-10-CM | POA: Diagnosis not present

## 2017-01-24 DIAGNOSIS — I509 Heart failure, unspecified: Secondary | ICD-10-CM | POA: Diagnosis not present

## 2017-01-24 DIAGNOSIS — M1990 Unspecified osteoarthritis, unspecified site: Secondary | ICD-10-CM | POA: Diagnosis not present

## 2017-01-24 DIAGNOSIS — J45909 Unspecified asthma, uncomplicated: Secondary | ICD-10-CM | POA: Diagnosis not present

## 2017-01-24 DIAGNOSIS — E11622 Type 2 diabetes mellitus with other skin ulcer: Secondary | ICD-10-CM | POA: Diagnosis not present

## 2017-01-24 DIAGNOSIS — Z7984 Long term (current) use of oral hypoglycemic drugs: Secondary | ICD-10-CM | POA: Diagnosis not present

## 2017-01-24 DIAGNOSIS — Z4800 Encounter for change or removal of nonsurgical wound dressing: Secondary | ICD-10-CM | POA: Diagnosis not present

## 2017-01-24 DIAGNOSIS — I429 Cardiomyopathy, unspecified: Secondary | ICD-10-CM | POA: Diagnosis not present

## 2017-01-26 ENCOUNTER — Encounter: Payer: Medicare Other | Admitting: Physician Assistant

## 2017-01-26 DIAGNOSIS — I509 Heart failure, unspecified: Secondary | ICD-10-CM | POA: Diagnosis not present

## 2017-01-26 DIAGNOSIS — L97222 Non-pressure chronic ulcer of left calf with fat layer exposed: Secondary | ICD-10-CM | POA: Diagnosis not present

## 2017-01-26 DIAGNOSIS — Z7984 Long term (current) use of oral hypoglycemic drugs: Secondary | ICD-10-CM | POA: Diagnosis not present

## 2017-01-26 DIAGNOSIS — E11622 Type 2 diabetes mellitus with other skin ulcer: Secondary | ICD-10-CM | POA: Diagnosis not present

## 2017-01-26 DIAGNOSIS — I428 Other cardiomyopathies: Secondary | ICD-10-CM | POA: Diagnosis not present

## 2017-01-26 DIAGNOSIS — L97822 Non-pressure chronic ulcer of other part of left lower leg with fat layer exposed: Secondary | ICD-10-CM | POA: Diagnosis not present

## 2017-01-26 DIAGNOSIS — I890 Lymphedema, not elsewhere classified: Secondary | ICD-10-CM | POA: Diagnosis not present

## 2017-01-26 DIAGNOSIS — I2510 Atherosclerotic heart disease of native coronary artery without angina pectoris: Secondary | ICD-10-CM | POA: Diagnosis not present

## 2017-01-26 DIAGNOSIS — I110 Hypertensive heart disease with heart failure: Secondary | ICD-10-CM | POA: Diagnosis not present

## 2017-01-29 ENCOUNTER — Encounter (INDEPENDENT_AMBULATORY_CARE_PROVIDER_SITE_OTHER): Payer: Self-pay | Admitting: Vascular Surgery

## 2017-01-29 ENCOUNTER — Encounter (INDEPENDENT_AMBULATORY_CARE_PROVIDER_SITE_OTHER): Payer: Self-pay

## 2017-01-29 DIAGNOSIS — Z79891 Long term (current) use of opiate analgesic: Secondary | ICD-10-CM | POA: Diagnosis not present

## 2017-01-29 DIAGNOSIS — I110 Hypertensive heart disease with heart failure: Secondary | ICD-10-CM | POA: Diagnosis not present

## 2017-01-29 DIAGNOSIS — M1990 Unspecified osteoarthritis, unspecified site: Secondary | ICD-10-CM | POA: Diagnosis not present

## 2017-01-29 DIAGNOSIS — I2510 Atherosclerotic heart disease of native coronary artery without angina pectoris: Secondary | ICD-10-CM | POA: Diagnosis not present

## 2017-01-29 DIAGNOSIS — I509 Heart failure, unspecified: Secondary | ICD-10-CM | POA: Diagnosis not present

## 2017-01-29 DIAGNOSIS — E11622 Type 2 diabetes mellitus with other skin ulcer: Secondary | ICD-10-CM | POA: Diagnosis not present

## 2017-01-29 DIAGNOSIS — Z7984 Long term (current) use of oral hypoglycemic drugs: Secondary | ICD-10-CM | POA: Diagnosis not present

## 2017-01-29 DIAGNOSIS — J45909 Unspecified asthma, uncomplicated: Secondary | ICD-10-CM | POA: Diagnosis not present

## 2017-01-29 DIAGNOSIS — L97222 Non-pressure chronic ulcer of left calf with fat layer exposed: Secondary | ICD-10-CM | POA: Diagnosis not present

## 2017-01-29 DIAGNOSIS — Z4800 Encounter for change or removal of nonsurgical wound dressing: Secondary | ICD-10-CM | POA: Diagnosis not present

## 2017-01-29 DIAGNOSIS — I429 Cardiomyopathy, unspecified: Secondary | ICD-10-CM | POA: Diagnosis not present

## 2017-01-29 DIAGNOSIS — I272 Pulmonary hypertension, unspecified: Secondary | ICD-10-CM | POA: Diagnosis not present

## 2017-01-29 NOTE — Progress Notes (Addendum)
CEYDA, PETERKA ([id number]) Visit Report for 01/26/2017 Chief Complaint Document Details Patient Name: [name], [name]. Date of Service: 01/26/2017 1:45 PM Medical Record Number: [id number] Patient Account Number: [id number] Date of Birth/Sex: 22-Mar-1945 (72 y.o. Female) Treating RN: Roger Shelter Primary Care Provider: Lelon Huh Other Clinician: Referring Provider: Lelon Huh Treating Provider/Extender: Melburn Hake, Boniface Goffe Weeks in Treatment: 16 Information Obtained from: Patient Chief Complaint Patient seen for complaints of Non-Healing Wounds to both lower extremities Electronic Signature(s) Signed: 01/27/2017 12:23:43 AM By: Worthy Keeler PA-C Entered By: Worthy Keeler on 01/26/2017 14:57:40 [name] ([id number]) -------------------------------------------------------------------------------- Debridement Details Patient Name: Jillian Albe B. Date of Service: 01/26/2017 1:45 PM Medical Record Number: [id number] Patient Account Number: [id number] Date of Birth/Sex: [birth year] (72 y.o. Female) Treating RN: Montey Hora Primary Care Provider: Lelon Huh Other Clinician: Referring Provider: Lelon Huh Treating Provider/Extender: Melburn Hake, Janes Colegrove Weeks in Treatment: 16 Debridement Performed for Wound #12 Left,Posterior Lower Leg Assessment: Performed By: Physician STONE III, Jacorie Ernsberger E., PA-C Debridement: Open Wound/Selective Severity of Tissue Pre Limited to breakdown of skin Debridement: Debridement Description: Selective Pre-procedure Verification/Time Yes - 02:37 Out Taken: Start Time: 02:37 Pain Control: Other : lidocaine 4% Level: Skin/Dermis Total Area Debrided (L x W): 0.5 (cm) x 0.4 (cm) = 0.2 (cm) Tissue and other material Viable, Non-Viable, Fibrin/Slough, Skin debrided: Instrument: Curette Bleeding: Minimum Hemostasis Achieved: Pressure End Time: 02:37 Procedural Pain: 0 Post Procedural Pain: 0 Response to Treatment: Procedure was  tolerated well Post Debridement Measurements of Total Wound Length: (cm) 0.5 Width: (cm) 0.4 Depth: (cm) 0.1 Volume: (cm) 0.016 Character of Wound/Ulcer Post Debridement: Stable Severity of Tissue Post Debridement: Limited to breakdown of skin Post Procedure Diagnosis Same as Pre-procedure Electronic Signature(s) Signed: 01/30/2017 2:45:57 PM By: Montey Hora Signed: 02/02/2017 4:49:21 PM By: Worthy Keeler PA-C Previous Signature: 01/26/2017 4:37:30 PM Version By: Montey Hora Previous Signature: 01/27/2017 12:23:43 AM Version By: Worthy Keeler PA-C Entered By: Montey Hora on 01/30/2017 14:45:57 [name] ([id number]) -------------------------------------------------------------------------------- HPI Details Patient Name: Jillian Albe B. Date of Service: 01/26/2017 1:45 PM Medical Record Number: [id number] Patient Account Number: [id number] Date of Birth/Sex: Aug 22, 1945 (72 y.o. Female) Treating RN: Roger Shelter Primary Care Provider: Lelon Huh Other Clinician: Referring Provider: Lelon Huh Treating Provider/Extender: Melburn Hake, Zyheir Daft Weeks in Treatment: 16 History of Present Illness Location: both lower extremity swelling with ulceration HPI Description: 72 year old patient who sees her PCP Dr. Lelon Huh was recently evaluated 10 days ago for diabetes mellitus, hypertension, CHF and hyperlipidemia. she also was noted to have ulcerations develop in her legs and she has been applying Silvadene dressings locally. In the past she has refused wound care referrals.her cardiologist Dr. Saunders Revel saw her and put her on 40 mg of furosemide daily. last hemoglobin A1c was 7.7%. she was also placed on ciprofloxacin twice daily for 7 days and a urine culture was recommended. past medical history significant for coronary artery disease, diabetes mellitus, nonischemic cardiomyopathy and pulmonary hypertension. She is also status post heart catheterization and coronary  angiography, tubal ligation and breast cyst removal in the past. She has never been a smoker. Patient had arterial studies done which showed bilateral ABIs are artificially elevated due to noncompressible and calcified vessels. Triphasic waveform throughout. Right great toe TBI is elevated while the left is normal. 10/23/2016 -- the patient is rather moribund from several issues including chronic back pain and knee pain and swelling of her legs. The large necrotic area on her left lateral anterior calf  was bleeding on touch after washing her leg. There was a spot which needed silver nitrate cauterization and this was done appropriately. 11/06/2016 -- she is again noted to have friable bleeding from the left lower extremity wounds and this again had to be cauterized with silver nitrate to control the bleeding as pressure itself would not do it. 11/20/2016 -- the right lower extremity is completely healed and we have ordered 30-40 mm compression stocking's in both the dural layer and also a pair of juxta lites. 12/18/2016 -- she has not been wearing her compression stockings on her right leg and these have opened out into ulcerations again. Her left lower extremity has circumferential ulcerations now. I believe at this stage I would like to get her venous reflux studies done to make certain that there is no fixable superficial venous reflux. 12/29/2016 -- she has made an excellent recovery having continued with her appropriate doses of diuretics and elevation and exercise. She has not yet received her right lower extremity juxta lites. Her venous duplex study is at the end of January 01/13/16 on evaluation today patient's wounds appeared to be overall doing much better much less hyper granular than previous week's evaluation. The Hydrofera Blue Dressing's to be doing very well. She does tell me that she is having a little bit more discomfort in the posterior aspect of her leg where she has a wound at  this point. Fortunately there appears to be no infection. 01/19/17 on evaluation today patient appears to be doing very well in regard to her bilateral lower extremity swelling and at this point in time her left lower extremity ulcers. Her wounds appear to be doing much better. She has been tolerating the dressing's at this point fortunately she did get the Juxta-Lite compression as of today as well. Nonetheless I am pleased that she has been tolerating everything so well and that her wounds looks so good. In fact she is an excellent granular surface no evidence of slough covering and I do not see any reason for likely debridement today. Especially in regard to the posterior leg. 01/26/17 on evaluation today patient appears to be doing decently well in regard to her wounds. The surface of the majority of her wounds is greatly improved. She has been using the Juxta-Lite compression which is excellent. Nonetheless the wound of the left posterior calf did require some debridement today otherwise the majority of the wounds did not require any debridement. The this was due to slough buildup on the surface. She also has a small skin flap where two of the ulcers actually connected and this has loosened up I'm concerned that things may not heal well with that flap but for the time being I'm not gonna do anything different in that regard. RANE, DUMM ([id number]) Electronic Signature(s) Signed: 01/27/2017 12:23:43 AM By: Worthy Keeler PA-C Entered By: Worthy Keeler on 01/26/2017 18:20:54 [name] ([id number]) -------------------------------------------------------------------------------- Physical Exam Details Patient Name: ALIXIS, HARMON. Date of Service: 01/26/2017 1:45 PM Medical Record Number: [id number] Patient Account Number: [id number] Date of Birth/Sex: 1945-10-05 (72 y.o. Female) Treating RN: Roger Shelter Primary Care Provider: Lelon Huh Other Clinician: Referring Provider:  Lelon Huh Treating Provider/Extender: Melburn Hake, Soniyah Mcglory Weeks in Treatment: 23 Constitutional Well-nourished and well-hydrated in no acute distress. Respiratory normal breathing without difficulty. Psychiatric this patient is able to make decisions and demonstrates good insight into disease process. Alert and Oriented x 3. pleasant and cooperative. Notes I did perform sharp debridement in  regard to patients left posterior lower leg and patient tolerated this well with some discomfort although post debridement she was doing much better. She did have some bleeding controlled with pressure. Electronic Signature(s) Signed: 01/27/2017 12:23:43 AM By: Worthy Keeler PA-C Entered By: Worthy Keeler on 01/26/2017 18:21:46 [name] ([id number]) -------------------------------------------------------------------------------- Physician Orders Details Patient Name: [name] Date of Service: 01/26/2017 1:45 PM Medical Record Number: [id number] Patient Account Number: [id number] Date of Birth/Sex: [birth year] (72 y.o. Female) Treating RN: Montey Hora Primary Care Provider: Lelon Huh Other Clinician: Referring Provider: Lelon Huh Treating Provider/Extender: Melburn Hake, Khalilah Hoke Weeks in Treatment: 52 Verbal / Phone Orders: No Diagnosis Coding Wound Cleansing Wound #1 Left,Lateral Lower Leg o Clean wound with Normal Saline. o Cleanse wound with mild soap and water o May Shower, gently pat wound dry prior to applying new dressing. Anesthetic (add to Medication List) Wound #1 Left,Lateral Lower Leg o Topical Lidocaine 4% cream applied to wound bed prior to debridement (In Clinic Only). Skin Barriers/Peri-Wound Care Wound #1 Left,Lateral Lower Leg o Barrier cream Primary Wound Dressing Wound #1 Left,Lateral Lower Leg o Hydrafera Blue - Please make sure this is Hydrafera Blue Ready Transfer, Pt has a package that it came in make sure you get cover the wound on  posterior leg as well Secondary Dressing Wound #1 Left,Lateral Lower Leg o ABD pad o XtraSorb Dressing Change Frequency Wound #1 Left,Lateral Lower Leg o Change Dressing Monday, Wednesday, Friday - HHRN to change Monday, Wednesday and Fridays and pt to come to [facility] on Fridays. Follow-up Appointments o Return Appointment in 1 week. Edema Control Wound #1 Left,Lateral Lower Leg o 3 Layer Compression System - Left Lower Extremity - ****Make sure you are only putting a 3-layer wrap on the left leg**** wrap up to 3 finger widths below the knee. o Patient to wear own Juxtalite/Juzo compression garment. - Please educate patient and spouse about juxtalite application and use o Elevate legs to the level of the heart and pump ankles as often as possible Additional Orders / Instructions Wound #1 Left,Lateral Lower Leg ALDENA, WORM B. ([id number]) o Vitamin A; Vitamin C, Zinc o Increase protein intake. Home Health Wound #1 Left,Lateral Lower Leg o Continue Home Health Visits - Please educate patient and spouse about juxtalite application and use o Home Health Nurse may visit PRN to address patientos wound care needs. o FACE TO FACE ENCOUNTER: MEDICARE and MEDICAID PATIENTS: I certify that this patient is under my care and that I had a face-to-face encounter that meets the physician face-to-face encounter requirements with this patient on this date. The encounter with the patient was in whole or in part for the following MEDICAL CONDITION: (primary reason for [place]) MEDICAL NECESSITY: I certify, that based on my findings, NURSING services are a medically necessary home health service. HOME BOUND STATUS: I certify that my clinical findings support that this patient is homebound (i.e., Due to illness or injury, pt requires aid of supportive devices such as crutches, cane, wheelchairs, walkers, the use of special transportation or the assistance of  another person to leave their place of residence. There is a normal inability to leave the home and doing so requires considerable and taxing effort. Other absences are for medical reasons / religious services and are infrequent or of short duration when for other reasons). o If current dressing causes regression in wound condition, may D/C ordered dressing product/s and apply Normal Saline Moist Dressing daily until next Wound  Healing Center / Other MD appointment. [place] of regression in wound condition at [phone]. o Please direct any NON-WOUND related issues/requests for orders to patient's Primary Care Physician Electronic Signature(s) Signed: 01/26/2017 4:37:30 PM By: Montey Hora Signed: 01/27/2017 12:23:43 AM By: Worthy Keeler PA-C Entered By: Montey Hora on 01/26/2017 14:40:26 [name] ([id number]) -------------------------------------------------------------------------------- Problem List Details Patient Name: SHAYLYNN, NULTY B. Date of Service: 01/26/2017 1:45 PM Medical Record Number: [id number] Patient Account Number: [id number] Date of Birth/Sex: January 31, 1945 (72 y.o. 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 Date of Service: 01/26/2017 1:45 PM Medical Record Number: [id number] Patient Account Number: [id number] Date of Birth/Sex: 1945/09/29 (72 y.o. Female) Treating RN: Roger Shelter Primary Care Provider: Lelon Huh Other Clinician: Referring Provider: Lelon Huh Treating Provider/Extender: Melburn Hake, Bridie Colquhoun Weeks in Treatment: 16 Subjective Chief Complaint Information obtained from Patient Patient seen for complaints of Non-Healing Wounds to both lower extremities History of Present Illness (HPI) The following HPI elements were documented for the patient's wound: Location: both lower extremity swelling with ulceration 72 year old patient who sees her PCP Dr. Lelon Huh was recently evaluated 10 days ago for diabetes mellitus, hypertension, CHF and hyperlipidemia. she also was noted to have ulcerations develop in her legs and she has been applying Silvadene dressings locally. In the past she has refused wound care referrals.her cardiologist Dr. Saunders Revel saw her and put her on 40 mg of furosemide daily. last hemoglobin A1c was 7.7%. she was also placed on ciprofloxacin twice daily for 7 days and a urine culture was recommended. past medical history significant for coronary artery disease, diabetes mellitus, nonischemic cardiomyopathy and pulmonary hypertension. She is also status post heart catheterization and coronary angiography, tubal ligation and breast cyst removal in the past. She has never been a smoker. Patient had arterial studies done which showed bilateral ABIs are artificially elevated due to noncompressible and calcified vessels. Triphasic waveform throughout. Right great toe TBI is elevated while the left is normal. 10/23/2016 -- the patient is rather moribund from several issues including chronic back pain and knee pain and  swelling of her legs. The large necrotic area on her left lateral anterior calf was bleeding on touch after washing her leg. There was a spot which needed silver nitrate cauterization and this was done appropriately. 11/06/2016 -- she is again noted to have friable bleeding from the left lower extremity wounds and this again had to be cauterized with silver nitrate to control the bleeding as pressure itself would not do it. 11/20/2016 -- the right lower extremity is completely healed and we have ordered 30-40 mm compression stocking's in both the dural layer and also a pair of juxta lites. 12/18/2016 -- she has not been wearing her compression stockings on her right leg and these have opened out into ulcerations again. Her left lower extremity has circumferential ulcerations now. I believe  at this stage I would like to get her venous reflux studies done to make certain that there is no fixable superficial venous reflux. 12/29/2016 -- she has made an excellent recovery having continued with her appropriate doses of diuretics and elevation and exercise. She has not yet received her right lower extremity juxta lites. Her venous duplex study is at the end of January 01/13/16 on evaluation today patient's wounds appeared to be overall doing much better much less hyper granular than previous week's evaluation. The Hydrofera Blue Dressing's to be doing very well. She does tell me that she is having a little bit more discomfort in the posterior aspect of her leg where she has a wound at this point. Fortunately there appears to be no infection. 01/19/17 on evaluation today patient appears to be doing very well in regard to her bilateral lower extremity swelling and at this point in time her left lower extremity ulcers. Her wounds appear to be doing much better. She has been tolerating the dressing's at this point fortunately she did get the Juxta-Lite compression as of today as well. Nonetheless I am pleased  that she has been tolerating everything so well and that her wounds looks so good. In fact she is an excellent granular surface no evidence of slough covering and I do not see any reason for likely debridement today. Especially in regard to the posterior [place], Nicci B. ([id number]) leg. 01/26/17 on evaluation today patient appears to be doing decently well in regard to her wounds. The surface of the majority of her wounds is greatly improved. She has been using the Juxta-Lite compression which is excellent. Nonetheless the wound of the left posterior calf did require some debridement today otherwise the majority of the wounds did not require any debridement. The this was due to slough buildup on the surface. She also has a small skin flap where two of the ulcers actually connected and this has loosened up I'm concerned that things may not heal well with that flap but for the time being I'm not gonna do anything different in that regard. Patient History Information obtained from Patient. Family History Cancer - Mother,Siblings, Diabetes - Mother,Siblings, Heart Disease - Mother, Hypertension - Siblings, No family history of Hereditary Spherocytosis, Kidney Disease, Lung Disease, Seizures, Stroke, Thyroid Problems, Tuberculosis. Social History Never smoker, Marital Status - Married, Alcohol Use - Never, Drug Use - No History, Caffeine Use - Daily. Review of Systems (ROS) Constitutional Symptoms (General Health) Denies complaints or symptoms of Fever, Chills. Respiratory The patient has no complaints or symptoms. Cardiovascular Complains or has symptoms of LE edema. Psychiatric The patient has no complaints or symptoms. Objective Constitutional Well-nourished and well-hydrated in no acute distress. Vitals Time Taken: 2:05 PM, Height: 64 in, Weight: 209 lbs, BMI: 35.9, Temperature: 98.2 F, Pulse: 59 bpm, Respiratory Rate: 18 breaths/min, Blood Pressure: 131/78 mmHg. Respiratory normal  breathing without difficulty. Psychiatric this patient is able to make decisions and demonstrates good insight into disease process. Alert and Oriented x 3. pleasant and cooperative. General Notes: I did perform sharp debridement in regard to patients left posterior lower leg and patient tolerated this well with some discomfort although post debridement she was doing much better. She did have some bleeding controlled with pressure. NAKEMA, FAKE ([id number]) Integumentary (Hair, Skin) Wound #1 status is Open. Original cause of wound was Gradually Appeared. The wound is located on the Left,Lateral Lower Leg. The wound measures 6.5cm length x 7cm width x 0.1cm depth; 35.736cm^2 area  and 3.574cm^3 volume. There is no tunneling or undermining noted. There is a large amount of serosanguineous drainage noted. The wound margin is distinct with the outline attached to the wound base. There is large (67-100%) red, friable, hyper - granulation within the wound bed. There is a small (1-33%) amount of necrotic tissue within the wound bed including Adherent Slough. The periwound skin appearance exhibited: Scarring. Periwound temperature was noted as No Abnormality. The periwound has tenderness on palpation. Wound #11 status is Open. Original cause of wound was Gradually Appeared. The wound is located on the Left,Medial Lower Leg. The wound measures 7.5cm length x 2.5cm width x 0.1cm depth; 14.726cm^2 area and 1.473cm^3 volume. There is no tunneling or undermining noted. There is a large amount of serous drainage noted. The wound margin is flat and intact. There is medium (34-66%) pink granulation within the wound bed. There is a medium (34-66%) amount of necrotic tissue within the wound bed including Adherent Slough. The periwound skin appearance did not exhibit: Callus, Crepitus, Excoriation, Induration, Rash, Scarring, Dry/Scaly, Maceration, Atrophie Blanche, Cyanosis, Ecchymosis, Hemosiderin Staining,  Mottled, Pallor, Rubor, Erythema. Periwound temperature was noted as No Abnormality. The periwound has tenderness on palpation. Wound #12 status is Open. Original cause of wound was Gradually Appeared. The wound is located on the Left,Posterior Lower Leg. The wound measures 0.5cm length x 0.4cm width x 0.1cm depth; 0.157cm^2 area and 0.016cm^3 volume. There is no tunneling or undermining noted. There is a large amount of serous drainage noted. The wound margin is flat and intact. There is medium (34-66%) red granulation within the wound bed. There is a medium (34-66%) amount of necrotic tissue within the wound bed including Adherent Slough. The periwound skin appearance did not exhibit: Callus, Crepitus, Excoriation, Induration, Rash, Scarring, Dry/Scaly, Maceration, Atrophie Blanche, Cyanosis, Ecchymosis, Hemosiderin Staining, Mottled, Pallor, Rubor, Erythema. Periwound temperature was noted as No Abnormality. The periwound has tenderness on palpation. Assessment Active Problems ICD-10 E11.622 - Type 2 diabetes mellitus with other skin ulcer I89.0 - Lymphedema, not elsewhere classified L97.212 - Non-pressure chronic ulcer of right calf with fat layer exposed L97.222 - Non-pressure chronic ulcer of left calf with fat layer exposed Procedures Wound #12 Pre-procedure diagnosis of Wound #12 is a Diabetic Wound/Ulcer of the Lower Extremity located on the Left,Posterior Lower Leg .Severity of Tissue Pre Debridement is: Fat layer exposed. There was a Skin/Subcutaneous Tissue Debridement (11042- 11047) debridement with total area of 0.2 sq cm performed by STONE III, Jaquia Benedicto E., PA-C. with the following instrument(s): Curette to remove Viable and Non-Viable tissue/material including Fibrin/Slough and Skin after achieving pain control using Other (lidocaine 4%). A time out was conducted at 02:37, prior to the start of the procedure. A Minimum amount of bleeding was controlled with Pressure. The procedure  was tolerated well with a pain level of 0 throughout and a pain level of 0 following the procedure. Post Debridement Measurements: 0.5cm length x 0.4cm width x 0.1cm depth; 0.016cm^3 volume. Character of Wound/Ulcer Post Debridement is stable. Severity of Tissue Post Debridement is: Limited to breakdown of skin. Post procedure Diagnosis Wound #12: Same as Pre-Procedure SHELIA, KINGSBERRY ([id number]) Plan Wound Cleansing: Wound #1 Left,Lateral Lower Leg: Clean wound with Normal Saline. Cleanse wound with mild soap and water May Shower, gently pat wound dry prior to applying new dressing. Anesthetic (add to Medication List): Wound #1 Left,Lateral Lower Leg: Topical Lidocaine 4% cream applied to wound bed prior to debridement (In Clinic Only). Skin Barriers/Peri-Wound Care: Wound #1 Left,Lateral Lower  Leg: Barrier cream Primary Wound Dressing: Wound #1 Left,Lateral Lower Leg: Hydrafera Blue - Please make sure this is Hydrafera Blue Ready Transfer, Pt has a package that it came in make sure you get cover the wound on posterior leg as well Secondary Dressing: Wound #1 Left,Lateral Lower Leg: ABD pad XtraSorb Dressing Change Frequency: Wound #1 Left,Lateral Lower Leg: Change Dressing Monday, Wednesday, Friday - HHRN to change Monday, Wednesday and Fridays and pt to come to [place] on Fridays. Follow-up Appointments: Return Appointment in 1 week. Edema Control: Wound #1 Left,Lateral Lower Leg: 3 Layer Compression System - Left Lower Extremity - ****Make sure you are only putting a 3-layer wrap on the left leg**** wrap up to 3 finger widths below the knee. Patient to wear own Juxtalite/Juzo compression garment. - Please educate patient and spouse about juxtalite application and use Elevate legs to the level of the heart and pump ankles as often as possible Additional Orders / Instructions: Wound #1 Left,Lateral Lower Leg: Vitamin A; Vitamin C, Zinc Increase protein  intake. Home Health: Wound #1 Left,Lateral Lower Leg: Continue Home Health Visits - Please educate patient and spouse about juxtalite application and use Home Health Nurse may visit PRN to address patient s wound care needs. FACE TO FACE ENCOUNTER: MEDICARE and MEDICAID PATIENTS: I certify that this patient is under my care and that I had a face-to-face encounter that meets the physician face-to-face encounter requirements with this patient on this date. The encounter with the patient was in whole or in part for the following MEDICAL CONDITION: (primary reason for [place]) MEDICAL NECESSITY: I certify, that based on my findings, NURSING services are a medically necessary home health service. HOME BOUND STATUS: I certify that my clinical findings support that this patient is homebound (i.e., Due to illness or injury, pt requires aid of supportive devices such as crutches, cane, wheelchairs, walkers, the use of special transportation or the assistance of another person to leave their place of residence. There is a normal inability to leave the home and doing so requires considerable and taxing effort. Other absences are for medical reasons / religious services and are infrequent or of short duration when for other reasons). If current dressing causes regression in wound condition, may D/C ordered dressing product/s and apply Normal Saline Moist Dressing daily until next [place] / Other MD appointment. [place] of regression in [place], [place] ([id number]) wound condition at [phone]. Please direct any NON-WOUND related issues/requests for orders to patient's Primary Care Physician I'm going to recommend that we continue with the Current wound care measures for the next week. She is in agreement with this plan. We will see were things stand following. Hopefully the skin flap mentioned above will lead here in this will not be an issue or require any  additional treatment. Nonetheless if it does not seem to be rehearing and is impeding healing we may need to remove this flap in order to allow for appropriate healing. She understands. We will see were things stand in one weeks time. Electronic Signature(s) Signed: 01/27/2017 12:23:43 AM By: Worthy Keeler PA-C Entered By: Worthy Keeler on 01/26/2017 18:22:17 [name] ([id number]) -------------------------------------------------------------------------------- ROS/PFSH Details Patient Name: [name] Date of Service: 01/26/2017 1:45 PM Medical Record Number: [id number] Patient Account Number: [id number] Date of Birth/Sex: 02-19-1945 (72 y.o. Female) Treating RN: Roger Shelter Primary Care Provider: Lelon Huh Other Clinician: Referring Provider: Lelon Huh Treating Provider/Extender: Melburn Hake, Amandeep Hogston Weeks  in Treatment: 16 Information Obtained From Patient Wound History Do you currently have one or more open woundso Yes How many open wounds do you currently haveo 6 Approximately how long have you had your woundso 2 months How have you been treating your wound(s) until nowo [place] Has your wound(s) ever healed and then re-openedo No Have you had any lab work done in the past montho No Have you tested positive for an antibiotic resistant organism (MRSA, VRE)o No Have you tested positive for osteomyelitis (bone infection)o No Have you had any tests for circulation on your legso Yes Where was the test [facility] Have you had other problems associated with your woundso Infection, Swelling Constitutional Symptoms (General Health) Complaints and Symptoms: Negative for: Fever; Chills Cardiovascular Complaints and Symptoms: Positive for: LE edema Medical History: Positive for: Congestive Heart Failure; Coronary Artery Disease; Hypertension Respiratory Complaints and Symptoms: No Complaints or Symptoms Medical History: Positive for:  Asthma Endocrine Medical History: Positive for: Type II Diabetes Time with diabetes: a long time Treated with: Oral agents Blood sugar tested every day: No Musculoskeletal MAILI, SHUTTERS ([id number]) Medical History: Positive for: Osteoarthritis Psychiatric Complaints and Symptoms: No Complaints or Symptoms Immunizations Pneumococcal Vaccine: Received Pneumococcal Vaccination: Yes Implantable Devices Family and Social History Cancer: Yes - Mother,Siblings; Diabetes: Yes - Mother,Siblings; Heart Disease: Yes - Mother; Hereditary Spherocytosis: No; Hypertension: Yes - Siblings; Kidney Disease: No; Lung Disease: No; Seizures: No; Stroke: No; Thyroid Problems: No; Tuberculosis: No; Never smoker; Marital Status - Married; Alcohol Use: Never; Drug Use: No History; Caffeine Use: Daily; Financial Concerns: No; Food, Clothing or Shelter Needs: No; Support System Lacking: No; Transportation Concerns: No; Advanced Directives: No; Patient does not want information on Advanced Directives; Do not resuscitate: No; Living Will: No; Medical Power of Attorney: No Physician Affirmation I have reviewed and agree with the above information. Electronic Signature(s) Signed: 01/27/2017 12:23:43 AM By: Worthy Keeler PA-C Signed: 01/29/2017 12:34:10 PM By: Roger Shelter Entered By: Worthy Keeler on 01/26/2017 18:21:28 [name] ([id number]) -------------------------------------------------------------------------------- SuperBill Details Patient Name: Jillian Albe B. Date of Service: 01/26/2017 Medical Record Number: [id number] Patient Account Number: [id number] Date of Birth/Sex: 1945-12-15 (72 y.o. 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

## 2017-01-30 NOTE — Progress Notes (Signed)
LAETITIA, SCHNEPF ([id number]) Visit Report for 01/26/2017 Arrival Information Watson Patient Name: [name], [name]. Date of Service: 01/26/2017 1:45 PM Medical Record Number: [id number] Patient Account Number: [id number] Date of Birth/Sex: 01/07/1945 (72 y.o. Female) Treating RN: Montey Hora Primary Care Laykin Rainone: Lelon Huh Other Clinician: Referring Jani Ploeger: Lelon Huh Treating Jedidiah Demartini/Extender: Melburn Hake, HOYT Weeks in Treatment: 16 Visit Information History Since Last Visit Added or deleted any medications: No Patient Arrived: Wheel Chair Any new allergies or adverse reactions: No Arrival Time: 14:01 Had a fall or experienced change in No Accompanied By: spouse activities of daily living that may affect Transfer Assistance: None risk of falls: Patient Identification Verified: Yes Signs or symptoms of abuse/neglect since last visito No Secondary Verification Process Yes Hospitalized since last visit: No Completed: Has Dressing in Place as Prescribed: Yes Patient Requires Transmission-Based No Has Compression in Place as Prescribed: Yes Precautions: Pain Present Now: Yes Patient Has Alerts: Yes Patient Alerts: hemoglobin A1c 6.5% 12/18 Electronic Signature(s) Signed: 01/26/2017 4:37:30 PM By: Montey Hora Entered By: Montey Hora on 01/26/2017 14:01:55 [name] ([id number]) -------------------------------------------------------------------------------- Encounter Discharge Information Watson Patient Name: Jillian Albe B. Date of Service: 01/26/2017 1:45 PM Medical Record Number: [id number] Patient Account Number: [id number] Date of Birth/Sex: [birth year] (72 y.o. Female) Treating RN: Montey Hora Primary Care Timo Hartwig: Lelon Huh Other Clinician: Referring Wells Gerdeman: Lelon Huh Treating Vickee Mormino/Extender: Melburn Hake, HOYT Weeks in Treatment: 44 Encounter Discharge Information Items Discharge Pain Level: 1 Discharge Condition:  Stable Ambulatory Status: Wheelchair Discharge Destination: Home Private Transportation: Auto Accompanied By: husband Schedule Follow-up Appointment: Yes Medication Reconciliation completed and provided No to Patient/Care Cheyene Hamric: Clinical Summary of Care: Electronic Signature(s) Signed: 01/26/2017 4:37:30 PM By: Montey Hora Entered By: Montey Hora on 01/26/2017 14:56:23 [name] ([id number]) -------------------------------------------------------------------------------- Lower Extremity Assessment Watson Patient Name: Jillian Albe B. Date of Service: 01/26/2017 1:45 PM Medical Record Number: [id number] Patient Account Number: [id number] Date of Birth/Sex: 1945-04-26 (72 y.o. Female) Treating RN: Montey Hora Primary Care Ishitha Roper: Lelon Huh Other Clinician: Referring Chinara Hertzberg: Lelon Huh Treating Lyvia Mondesir/Extender: Melburn Hake, HOYT Weeks in Treatment: 16 Edema Assessment Assessed: [Left: No] [Right: No] [Left: Edema] [Right: :] Calf Left: Right: Point of Measurement: 30 cm From Medial Instep 41.5 cm cm Ankle Left: Right: Point of Measurement: 10 cm From Medial Instep 22.5 cm cm Vascular Assessment Pulses: Dorsalis Pedis Palpable: [Left:Yes] Posterior Tibial Extremity colors, hair growth, and conditions: Extremity Color: [Left:Hyperpigmented] Hair Growth on Extremity: [Left:No] Temperature of Extremity: [Left:Warm] Capillary Refill: [Left:< 3 seconds] Toe Nail Assessment Left: Right: Thick: Yes Discolored: Yes Deformed: Yes Improper Length and Hygiene: Yes Electronic Signature(s) Signed: 01/26/2017 4:37:30 PM By: Montey Hora Entered By: Montey Hora on 01/26/2017 14:17:41 [name] ([id number]) -------------------------------------------------------------------------------- Multi Wound Chart Watson Patient Name: Jillian Albe B. Date of Service: 01/26/2017 1:45 PM Medical Record Number: [id number] Patient Account Number:  [id number] Date of Birth/Sex: 07-06-1945 (72 y.o. Female) Treating RN: Montey Hora Primary Care Khristie Sak: Lelon Huh Other Clinician: Referring Laretta Pyatt: Lelon Huh Treating Taimi Towe/Extender: Melburn Hake, HOYT Weeks in Treatment: 16 Vital Signs Height(in): 64 Pulse(bpm): 42 Weight(lbs): 209 Blood Pressure(mmHg): 131/78 Body Mass Index(BMI): 36 Temperature(F): 98.2 Respiratory Rate 18 (breaths/min): Photos: [1:No Photos] [11:No Photos] [12:No Photos] Wound Location: [1:Left Lower Leg - Lateral] [11:Left Lower Leg - Medial] [12:Left Lower Leg - Posterior] Wounding Event: [1:Gradually Appeared] [11:Gradually Appeared] [12:Gradually Appeared] Primary Etiology: [1: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] [11:Lower Extremity] [12:Lower Extremity] Comorbid History: [1:Asthma, Congestive Heart Failure,  Coronary Artery Disease, Hypertension, Type II Disease, Hypertension, Type II Disease, Hypertension, Type II Diabetes, Osteoarthritis] [11:Asthma, Congestive Heart Failure, Coronary Artery Diabetes,  Osteoarthritis] [12:Asthma, Congestive Heart Failure, Coronary Artery Diabetes, Osteoarthritis] Date Acquired: [1:08/02/2016] [11:01/26/2017] [12:01/26/2017] Weeks of Treatment: [1:16] [11:0] [12:0] Wound Status: [1:Open] [11:Open] [12:Open] Clustered Wound: [1:No] [11:Yes] [12:No] Clustered Quantity: [1:N/A] [11:2] [12:N/A] Measurements L x W x D [1:6.5x7x0.1] [11:7.5x2.5x0.1] [12:0.5x0.4x0.1] (cm) Area (cm) : [1:35.736] [11:14.726] [12:0.157] Volume (cm) : [1:3.574] [11:1.473] [12:0.016] % Reduction in Area: [1:-106.80%] [11:N/A] [12:N/A] % Reduction in Volume: [1:-106.80%] [11:N/A] [12:N/A] Classification: [1:Grade 2] [11:Grade 2] [12:Grade 2] Exudate Amount: [1:Large] [11:Large] [12:Large] Exudate Type: [1:Serosanguineous] [11:Serous] [12:Serous] Exudate Color: [1:red, brown] [11:amber] [12:amber] Wound Margin:  [1:Distinct, outline attached] [11:Flat and Intact] [12:Flat and Intact] Granulation Amount: [1:Large (67-100%)] [11:Medium (34-66%)] [12:Medium (34-66%)] Granulation Quality: [1:Red, Hyper-granulation, Friable] [11:Pink] [12:Red] Necrotic Amount: [1:Small (1-33%)] [11:Medium (34-66%)] [12:Medium (34-66%)] Exposed Structures: [1:Fascia: No Fat Layer (Subcutaneous Tissue) Exposed: No Tendon: No Muscle: No Joint: No Bone: No] [11:Fascia: No Fat Layer (Subcutaneous Tissue) Exposed: No Tendon: No Muscle: No Joint: No Bone: No] [12:Fascia: No Fat Layer (Subcutaneous Tissue)  Exposed: No Tendon: No Muscle: No Joint: No Bone: No] Epithelialization: [1:None] [11:Small (1-33%)] [12:None] Periwound Skin Texture: Scarring: Yes Excoriation: No Excoriation: No Induration: No Induration: No Callus: No Callus: No Crepitus: No Crepitus: No Rash: No Rash: No Scarring: No Scarring: No Periwound Skin Moisture: No Abnormalities Noted Maceration: No Maceration: No Dry/Scaly: No Dry/Scaly: No Periwound Skin Color: No Abnormalities Noted Atrophie Blanche: No Atrophie Blanche: No Cyanosis: No Cyanosis: No Ecchymosis: No Ecchymosis: No Erythema: No Erythema: No Hemosiderin Staining: No Hemosiderin Staining: No Mottled: No Mottled: No Pallor: No Pallor: No Rubor: No Rubor: No Temperature: No Abnormality No Abnormality No Abnormality Tenderness on Palpation: Yes Yes Yes Wound Preparation: Ulcer Cleansing: Ulcer Cleansing: Ulcer Cleansing: Other: soap Rinsed/Irrigated with Saline, Rinsed/Irrigated with Saline, water Other: soap and water Other: soap and water Topical Anesthetic Applied: Topical Anesthetic Applied: Topical Anesthetic Applied: Other: lidocaine 4% Other: lidocaine 4% Other: lidocaine 4% Treatment Notes Electronic Signature(s) Signed: 01/26/2017 4:37:30 PM By: Montey Hora Entered By: Montey Hora on 01/26/2017 14:19:14 [name]  ([id number]) -------------------------------------------------------------------------------- [place] Watson Patient Name: [name], [name] Date of Service: 01/26/2017 1:45 PM Medical Record Number: [id number] Patient Account Number: [id number] Date of Birth/Sex: [birth year] (72 y.o. Female) Treating RN: Montey Hora Primary Care Malcomb Gangemi: Lelon Huh Other Clinician: Referring Shan Valdes: Lelon Huh Treating Amrita Radu/Extender: Melburn Hake, HOYT Weeks in Treatment: 16 Active Inactive ` Abuse / Safety / Falls / Self Care Management Nursing Diagnoses: History of Falls Potential for falls Goals: Patient will not experience any injury related to falls Date Initiated: 10/02/2016 Target Resolution Date: 01/06/2017 Goal Status: Active Interventions: Assess: immobility, friction, shearing, incontinence upon admission and as needed Assess impairment of mobility on admission and as needed per policy Assess personal safety and home safety (as indicated) on admission and as needed Notes: ` Nutrition Nursing Diagnoses: Imbalanced nutrition Impaired glucose control: actual or potential Potential for alteratiion in Nutrition/Potential for imbalanced nutrition Goals: Patient/caregiver agrees to and verbalizes understanding of need to use nutritional supplements and/or vitamins as prescribed Date Initiated: 10/02/2016 Target Resolution Date: 02/10/2017 Goal Status: Active Patient/caregiver will maintain therapeutic glucose control Date Initiated: 10/02/2016 Target Resolution Date: 01/06/2017 Goal Status: Active Interventions: Assess patient nutrition upon admission and as needed per policy Provide education on elevated blood sugars and impact on wound healing Provide education on nutrition Treatment Activities: Education provided on Nutrition :  10/02/2016 [name] ([id number]) Notes: ` Orientation to the Wound Care Program Nursing Diagnoses: Knowledge deficit  related to the wound healing center program Goals: Patient/caregiver will verbalize understanding of the [facility] Date Initiated: 10/02/2016 Target Resolution Date: 11/11/2016 Goal Status: Active Interventions: Provide education on orientation to the wound center Notes: ` Pain, Acute or Chronic Nursing Diagnoses: Pain, acute or chronic: actual or potential Potential alteration in comfort, pain Goals: Patient/caregiver will verbalize adequate pain control between visits Date Initiated: 10/02/2016 Target Resolution Date: 02/10/2017 Goal Status: Active Interventions: Complete pain assessment as per visit requirements Notes: ` Wound/Skin Impairment Nursing Diagnoses: Impaired tissue integrity Knowledge deficit related to ulceration/compromised skin integrity Goals: Ulcer/skin breakdown will have a volume reduction of 80% by week 12 Date Initiated: 10/02/2016 Target Resolution Date: 02/03/2017 Goal Status: Active Interventions: Assess patient/caregiver ability to perform ulcer/skin care regimen upon admission and as needed Assess ulceration(s) every visit Notes: [name], [name] ([id number]) Electronic Signature(s) Signed: 01/26/2017 4:37:30 PM By: Montey Hora Entered By: Montey Hora on 01/26/2017 14:19:03 [name] ([id number]) -------------------------------------------------------------------------------- Pain Assessment Watson Patient Name: Jillian Albe B. Date of Service: 01/26/2017 1:45 PM Medical Record Number: [id number] Patient Account Number: [id number] Date of Birth/Sex: 06/26/1945 (72 y.o. Female) Treating RN: Montey Hora Primary Care Jocsan Mcginley: Lelon Huh Other Clinician: Referring Chaseton Yepiz: Lelon Huh Treating Timica Marcom/Extender: Melburn Hake, HOYT Weeks in Treatment: 16 Active Problems Location of Pain Severity and Description of Pain Patient Has Paino Yes Site Locations Pain Management and Medication Current Pain  Management: Notes Topical or injectable lidocaine is offered to patient for acute pain when surgical debridement is performed. If needed, Patient is instructed to use over the counter pain medication for the following 24-48 hours after debridement. Wound care MDs do not prescribed pain medications. Patient has chronic pain or uncontrolled pain. Patient has been instructed to make an appointment with their Primary Care Physician for pain management. Electronic Signature(s) Signed: 01/26/2017 4:37:30 PM By: Montey Hora Entered By: Montey Hora on 01/26/2017 14:02:28 [name] ([id number]) -------------------------------------------------------------------------------- Patient/Caregiver Education Watson Patient Name: [name] Date of Service: 01/26/2017 1:45 PM Medical Record Number: [id number] Patient Account Number: [id number] Date of Birth/Gender: 1945-04-05 (72 y.o. Female) Treating RN: Montey Hora Primary Care Physician: Lelon Huh Other Clinician: Referring Physician: Lelon Huh Treating Physician/Extender: Sharalyn Ink in Treatment: 16 Education Assessment Education Provided To: Patient Education Topics Provided Wound Debridement: Handouts: Wound Debridement Methods: Explain/Verbal Responses: State content correctly Wound/Skin Impairment: Methods: Explain/Verbal Responses: State content correctly Electronic Signature(s) Signed: 01/26/2017 4:37:30 PM By: Montey Hora Entered By: Montey Hora on 01/26/2017 14:56:41 [place], Jeanice Lim ([id number]) -------------------------------------------------------------------------------- Wound Assessment Watson Patient Name: Jillian Albe B. Date of Service: 01/26/2017 1:45 PM Medical Record Number: [id number] Patient Account Number: [id number] Date of Birth/Sex: December 26, 1945 (72 y.o. Female) Treating RN: Montey Hora Primary Care Xitlalic Maslin: Lelon Huh Other Clinician: Referring Charlaine Utsey: Lelon Huh Treating Rainbow Salman/Extender: Melburn Hake, HOYT Weeks in Treatment: 16 Wound Status Wound Number: 1 Primary Diabetic Wound/Ulcer of the Lower Extremity Etiology: Wound Location: Left Lower Leg - Lateral Wound Open Wounding Event: Gradually Appeared Status: Date Acquired: 08/02/2016 Comorbid Asthma, Congestive Heart Failure, Coronary Weeks Of Treatment: 16 History: Artery Disease, Hypertension, Type II Diabetes, Clustered Wound: No Osteoarthritis Photos Photo Uploaded By: Roger Shelter on 01/26/2017 16:56:48 Wound Measurements Length: (cm) 6.5 Width: (cm) 7 Depth: (cm) 0.1 Area: (cm) 35.736 Volume: (cm) 3.574 % Reduction in Area: -106.8% % Reduction in Volume: -106.8% Epithelialization: None Tunneling: No Undermining: No Wound  Description Classification: Grade 2 Wound Margin: Distinct, outline attached Exudate Amount: Large Exudate Type: Serosanguineous Exudate Color: red, brown Foul Odor After Cleansing: No Slough/Fibrino Yes Wound Bed Granulation Amount: Large (67-100%) Exposed Structure Granulation Quality: Red, Hyper-granulation, Friable Fascia Exposed: No Necrotic Amount: Small (1-33%) Fat Layer (Subcutaneous Tissue) Exposed: No Necrotic Quality: Adherent Slough Tendon Exposed: No Muscle Exposed: No Joint Exposed: No Bone Exposed: No Periwound Skin Texture [name], Jillian B. ([id number]) Texture Color No Abnormalities Noted: No No Abnormalities Noted: No Scarring: Yes Temperature / Pain Moisture Temperature: No Abnormality No Abnormalities Noted: No Tenderness on Palpation: Yes Wound Preparation Ulcer Cleansing: Rinsed/Irrigated with Saline, Other: soap and water, Topical Anesthetic Applied: Other: lidocaine 4%, Treatment Notes Wound #1 (Left, Lateral Lower Leg) 1. Cleansed with: Clean wound with Normal Saline 2. Anesthetic Topical Lidocaine 4% cream to wound bed prior to debridement 4. Dressing Applied: Hydrafera Blue 5. Secondary  Dressing Applied ABD Pad 7. Secured with 3 Layer Compression System - Left Lower Extremity Electronic Signature(s) Signed: 01/26/2017 4:37:30 PM By: Montey Hora Entered By: Montey Hora on 01/26/2017 14:15:31 [name] ([id number]) -------------------------------------------------------------------------------- Wound Assessment Watson Patient Name: GIADA, SCHOPPE B. Date of Service: 01/26/2017 1:45 PM Medical Record Number: [id number] Patient Account Number: [id number] Date of Birth/Sex: 11/30/1945 (72 y.o. Female) Treating RN: Montey Hora Primary Care Ketty Bitton: Lelon Huh Other Clinician: Referring Analisse Randle: Lelon Huh Treating Jewels Langone/Extender: Melburn Hake, HOYT Weeks in Treatment: 16 Wound Status Wound Number: 11 Primary Diabetic Wound/Ulcer of the Lower Extremity Etiology: Wound Location: Left Lower Leg - Medial Wound Open Wounding Event: Gradually Appeared Status: Date Acquired: 01/26/2017 Comorbid Asthma, Congestive Heart Failure, Coronary Weeks Of Treatment: 0 History: Artery Disease, Hypertension, Type II Diabetes, Clustered Wound: Yes Osteoarthritis Photos Photo Uploaded By: Roger Shelter on 01/26/2017 16:57:18 Wound Measurements Length: (cm) 7.5 % Re Width: (cm) 2.5 % Re Depth: (cm) 0.1 Epit Clustered Quantity: 2 Tunn Area: (cm) 14.726 Und Volume: (cm) 1.473 duction in Area: duction in Volume: helialization: Small (1-33%) eling: No ermining: No Wound Description Classification: Grade 2 Wound Margin: Flat and Intact Exudate Amount: Large Exudate Type: Serous Exudate Color: amber Foul Odor After Cleansing: No Slough/Fibrino Yes Wound Bed Granulation Amount: Medium (34-66%) Exposed Structure Granulation Quality: Pink Fascia Exposed: No Necrotic Amount: Medium (34-66%) Fat Layer (Subcutaneous Tissue) Exposed: No Necrotic Quality: Adherent Slough Tendon Exposed: No Muscle Exposed: No Joint Exposed: No Bone Exposed: No [name], Jillian B. ([id number]) Periwound Skin Texture Texture Color No Abnormalities Noted: No No Abnormalities Noted: No Callus: No Atrophie Blanche: No Crepitus: No Cyanosis: No Excoriation: No Ecchymosis: No Induration: No Erythema: No Rash: No Hemosiderin Staining: No Scarring: No Mottled: No Pallor: No Moisture Rubor: No No Abnormalities Noted: No Dry / Scaly: No Temperature / Pain Maceration: No Temperature: No Abnormality Tenderness on Palpation: Yes Wound Preparation Ulcer Cleansing: Rinsed/Irrigated with Saline, Other: soap and water, Topical Anesthetic Applied: Other: lidocaine 4%, Treatment Notes Wound #11 (Left, Medial Lower Leg) 1. Cleansed with: Clean wound with Normal Saline 2. Anesthetic Topical Lidocaine 4% cream to wound bed prior to debridement 4. Dressing Applied: Hydrafera Blue 5. Secondary Dressing Applied ABD Pad 7. Secured with 3 Layer Compression System - Left Lower Extremity Electronic Signature(s) Signed: 01/26/2017 4:37:30 PM By: Montey Hora Entered By: Montey Hora on 01/26/2017 14:16:22 [name] ([id number]) -------------------------------------------------------------------------------- Wound Assessment Watson Patient Name: [name], Jillian B. Date of Service: 01/26/2017 1:45 PM Medical Record Number: [id number] Patient Account Number: [id number] Date of Birth/Sex: Nov 05, 1945 (72 y.o. Female) Treating RN: Montey Hora  Primary Care Rutha Melgoza: Lelon Huh Other Clinician: Referring Surah Pelley: Lelon Huh Treating Nickalus Thornsberry/Extender: Melburn Hake, HOYT Weeks in Treatment: 16 Wound Status Wound Number: 12 Primary Diabetic Wound/Ulcer of the Lower Extremity Etiology: Wound Location: Left Lower Leg - Posterior Wound Open Wounding Event: Gradually Appeared Status: Date Acquired: 01/26/2017 Comorbid Asthma, Congestive Heart Failure, Coronary Weeks Of Treatment: 0 History: Artery Disease, Hypertension, Type II Diabetes, Clustered  Wound: No Osteoarthritis Photos Photo Uploaded By: Roger Shelter on 01/26/2017 16:57:39 Wound Measurements Length: (cm) 0.5 Width: (cm) 0.4 Depth: (cm) 0.1 Area: (cm) 0.157 Volume: (cm) 0.016 % Reduction in Area: % Reduction in Volume: Epithelialization: None Tunneling: No Undermining: No Wound Description Classification: Grade 2 Wound Margin: Flat and Intact Exudate Amount: Large Exudate Type: Serous Exudate Color: amber Foul Odor After Cleansing: No Slough/Fibrino No Wound Bed Granulation Amount: Medium (34-66%) Exposed Structure Granulation Quality: Red Fascia Exposed: No Necrotic Amount: Medium (34-66%) Fat Layer (Subcutaneous Tissue) Exposed: No Necrotic Quality: Adherent Slough Tendon Exposed: No Muscle Exposed: No Joint Exposed: No Bone Exposed: No Periwound Skin Texture [name], Jillian B. ([id number]) Texture Color No Abnormalities Noted: No No Abnormalities Noted: No Callus: No Atrophie Blanche: No Crepitus: No Cyanosis: No Excoriation: No Ecchymosis: No Induration: No Erythema: No Rash: No Hemosiderin Staining: No Scarring: No Mottled: No Pallor: No Moisture Rubor: No No Abnormalities Noted: No Dry / Scaly: No Temperature / Pain Maceration: No Temperature: No Abnormality Tenderness on Palpation: Yes Wound Preparation Ulcer Cleansing: Other: soap water, Topical Anesthetic Applied: Other: lidocaine 4%, Treatment Notes Wound #12 (Left, Posterior Lower Leg) 1. Cleansed with: Clean wound with Normal Saline 2. Anesthetic Topical Lidocaine 4% cream to wound bed prior to debridement 4. Dressing Applied: Hydrafera Blue 5. Secondary Dressing Applied ABD Pad 7. Secured with 3 Layer Compression System - Left Lower Extremity Electronic Signature(s) Signed: 01/26/2017 4:37:30 PM By: Montey Hora Entered By: Montey Hora on 01/26/2017 14:14:53 [name]  ([id number]) -------------------------------------------------------------------------------- [name] Patient Name: Jillian Albe B. Date of Service: 01/26/2017 1:45 PM Medical Record Number: [id number] Patient Account Number: [id number] Date of Birth/Sex: [birth year] (72 y.o. Female) Treating RN: Montey Hora Primary Care Zaide Kardell: Lelon Huh Other Clinician: Referring Abegail Kloeppel: Lelon Huh Treating Reace Breshears/Extender: Melburn Hake, HOYT Weeks in Treatment: 16 Vital Signs Time Taken: 14:05 Temperature (F): 98.2 Height (in): 64 Pulse (bpm): 59 Weight (lbs): 209 Respiratory Rate (breaths/min): 18 Body Mass Index (BMI): 35.9 Blood Pressure (mmHg): 131/78 Reference Range: 80 - 120 mg / dl Electronic Signature(s) Signed: 01/26/2017 4:37:30 PM By: Montey Hora Entered By: Montey Hora on 01/26/2017 14:05:29

## 2017-01-31 DIAGNOSIS — J45909 Unspecified asthma, uncomplicated: Secondary | ICD-10-CM | POA: Diagnosis not present

## 2017-01-31 DIAGNOSIS — I272 Pulmonary hypertension, unspecified: Secondary | ICD-10-CM | POA: Diagnosis not present

## 2017-01-31 DIAGNOSIS — I2510 Atherosclerotic heart disease of native coronary artery without angina pectoris: Secondary | ICD-10-CM | POA: Diagnosis not present

## 2017-01-31 DIAGNOSIS — M1990 Unspecified osteoarthritis, unspecified site: Secondary | ICD-10-CM | POA: Diagnosis not present

## 2017-01-31 DIAGNOSIS — L97222 Non-pressure chronic ulcer of left calf with fat layer exposed: Secondary | ICD-10-CM | POA: Diagnosis not present

## 2017-01-31 DIAGNOSIS — I429 Cardiomyopathy, unspecified: Secondary | ICD-10-CM | POA: Diagnosis not present

## 2017-01-31 DIAGNOSIS — E11622 Type 2 diabetes mellitus with other skin ulcer: Secondary | ICD-10-CM | POA: Diagnosis not present

## 2017-01-31 DIAGNOSIS — Z79891 Long term (current) use of opiate analgesic: Secondary | ICD-10-CM | POA: Diagnosis not present

## 2017-01-31 DIAGNOSIS — I509 Heart failure, unspecified: Secondary | ICD-10-CM | POA: Diagnosis not present

## 2017-01-31 DIAGNOSIS — I110 Hypertensive heart disease with heart failure: Secondary | ICD-10-CM | POA: Diagnosis not present

## 2017-01-31 DIAGNOSIS — Z7984 Long term (current) use of oral hypoglycemic drugs: Secondary | ICD-10-CM | POA: Diagnosis not present

## 2017-01-31 DIAGNOSIS — Z4800 Encounter for change or removal of nonsurgical wound dressing: Secondary | ICD-10-CM | POA: Diagnosis not present

## 2017-02-02 ENCOUNTER — Encounter: Payer: Medicare Other | Attending: Physician Assistant | Admitting: Physician Assistant

## 2017-02-02 DIAGNOSIS — Z7984 Long term (current) use of oral hypoglycemic drugs: Secondary | ICD-10-CM | POA: Diagnosis not present

## 2017-02-02 DIAGNOSIS — L97212 Non-pressure chronic ulcer of right calf with fat layer exposed: Secondary | ICD-10-CM | POA: Insufficient documentation

## 2017-02-02 DIAGNOSIS — Z79899 Other long term (current) drug therapy: Secondary | ICD-10-CM | POA: Diagnosis not present

## 2017-02-02 DIAGNOSIS — E11622 Type 2 diabetes mellitus with other skin ulcer: Secondary | ICD-10-CM | POA: Diagnosis not present

## 2017-02-02 DIAGNOSIS — I110 Hypertensive heart disease with heart failure: Secondary | ICD-10-CM | POA: Diagnosis not present

## 2017-02-02 DIAGNOSIS — E11621 Type 2 diabetes mellitus with foot ulcer: Secondary | ICD-10-CM | POA: Insufficient documentation

## 2017-02-02 DIAGNOSIS — I509 Heart failure, unspecified: Secondary | ICD-10-CM | POA: Diagnosis not present

## 2017-02-02 DIAGNOSIS — J45909 Unspecified asthma, uncomplicated: Secondary | ICD-10-CM | POA: Diagnosis not present

## 2017-02-02 DIAGNOSIS — Z7982 Long term (current) use of aspirin: Secondary | ICD-10-CM | POA: Diagnosis not present

## 2017-02-02 DIAGNOSIS — I2510 Atherosclerotic heart disease of native coronary artery without angina pectoris: Secondary | ICD-10-CM | POA: Insufficient documentation

## 2017-02-02 DIAGNOSIS — L97222 Non-pressure chronic ulcer of left calf with fat layer exposed: Secondary | ICD-10-CM | POA: Insufficient documentation

## 2017-02-02 DIAGNOSIS — I429 Cardiomyopathy, unspecified: Secondary | ICD-10-CM | POA: Insufficient documentation

## 2017-02-02 DIAGNOSIS — I890 Lymphedema, not elsewhere classified: Secondary | ICD-10-CM | POA: Diagnosis not present

## 2017-02-02 DIAGNOSIS — E785 Hyperlipidemia, unspecified: Secondary | ICD-10-CM | POA: Insufficient documentation

## 2017-02-02 DIAGNOSIS — L97229 Non-pressure chronic ulcer of left calf with unspecified severity: Secondary | ICD-10-CM | POA: Diagnosis not present

## 2017-02-02 DIAGNOSIS — I272 Pulmonary hypertension, unspecified: Secondary | ICD-10-CM | POA: Insufficient documentation

## 2017-02-04 NOTE — Progress Notes (Signed)
[name], [name] ([id number]) Visit Report for 02/02/2017 Arrival Information Details Patient Name: [name], [name]. Date of Service: 02/02/2017 10:15 AM Medical Record Number: [id number] Patient Account Number: [id number] Date of Birth/Sex: May 10, 1945 (72 y.o. Female) Treating RN: Cornell Barman Primary Care Garland Smouse: Lelon Huh Other Clinician: Referring Kendre Jacinto: Lelon Huh Treating Fardeen Steinberger/Extender: Melburn Hake, HOYT Weeks in Treatment: 91 Visit Information History Since Last Visit Added or deleted any medications: No Patient Arrived: Wheel Chair Any new allergies or adverse reactions: No Arrival Time: 10:17 Had a fall or experienced change in No Accompanied By: husband activities of daily living that may affect Transfer Assistance: Manual risk of falls: Patient Identification Verified: Yes Signs or symptoms of abuse/neglect since last visito No Secondary Verification Process Yes Hospitalized since last visit: No Completed: Has Dressing in Place as Prescribed: Yes Patient Requires Transmission-Based No Has Compression in Place as Prescribed: Yes Precautions: Pain Present Now: No Patient Has Alerts: Yes Patient Alerts: hemoglobin A1c 6.5% 12/18 Electronic Signature(s) Signed: 02/02/2017 5:27:52 PM By: Gretta Cool, BSN, RN, CWS, Kim RN, BSN Entered By: Gretta Cool, BSN, RN, CWS, Kim on 02/02/2017 10:21:42 [name] ([id number]) -------------------------------------------------------------------------------- Compression Therapy Details Patient Name: [name], Jillian B. Date of Service: 02/02/2017 10:15 AM Medical Record Number: [id number] Patient Account Number: [id number] Date of Birth/Sex: [birth year] (72 y.o. Female) Treating RN: Cornell Barman Primary Care Aviyah Swetz: Lelon Huh Other Clinician: Referring Keandre Linden: Lelon Huh Treating Wyatt Thorstenson/Extender: Melburn Hake, HOYT Weeks in Treatment: 17 Compression Therapy Performed for Wound Assessment: Wound #1 Left,Lateral Lower  Leg Performed By: Clinician Cornell Barman, RN Compression Type: Three Layer Post Procedure Diagnosis Same as Pre-procedure Electronic Signature(s) Signed: 02/02/2017 5:27:52 PM By: Gretta Cool, BSN, RN, CWS, Kim RN, BSN Entered By: Gretta Cool, BSN, RN, CWS, Kim on 02/02/2017 10:53:02 [name] ([id number]) -------------------------------------------------------------------------------- Encounter Discharge Information Details Patient Name: [name], [name]. Date of Service: 02/02/2017 10:15 AM Medical Record Number: [id number] Patient Account Number: [id number] Date of Birth/Sex: 03/12/1945 (72 y.o. Female) Treating RN: Cornell Barman Primary Care Rinaldo Macqueen: Lelon Huh Other Clinician: Referring Caiden Monsivais: Lelon Huh Treating Adalida Garver/Extender: Melburn Hake, HOYT Weeks in Treatment: 33 Encounter Discharge Information Items Discharge Pain Level: 0 Discharge Condition: Stable Ambulatory Status: Wheelchair Discharge Destination: Home Transportation: Private Auto Accompanied By: husband Schedule Follow-up Appointment: Yes Medication Reconciliation completed and Yes provided to Patient/Care Tiyana Galla: Provided on Clinical Summary of Care: 02/02/2017 Form Type Recipient Paper Patient CS Electronic Signature(s) Signed: 02/02/2017 4:27:15 PM By: Ruthine Dose Entered By: Ruthine Dose on 02/02/2017 11:07:50 [name] ([id number]) -------------------------------------------------------------------------------- Lower Extremity Assessment Details Patient Name: Jillian Albe B. Date of Service: 02/02/2017 10:15 AM Medical Record Number: [id number] Patient Account Number: [id number] Date of Birth/Sex: 1945-08-25 (72 y.o. Female) Treating RN: Cornell Barman Primary Care Tashonna Descoteaux: Lelon Huh Other Clinician: Referring Gurveer Colucci: Lelon Huh Treating Wafa Martes/Extender: Melburn Hake, HOYT Weeks in Treatment: 17 Edema Assessment Assessed: [Left: No] [Right: No] [Left: Edema] [Right: :] Calf Left:  Right: Point of Measurement: 30 cm From Medial Instep 38 cm cm Ankle Left: Right: Point of Measurement: 10 cm From Medial Instep 22 cm cm Vascular Assessment Pulses: Dorsalis Pedis Palpable: [Left:Yes] Posterior Tibial Extremity colors, hair growth, and conditions: Extremity Color: [Left:Normal] Hair Growth on Extremity: [Left:No] Temperature of Extremity: [Left:Warm] Capillary Refill: [Left:< 3 seconds] Toe Nail Assessment Left: Right: Thick: Yes Discolored: Yes Deformed: Yes Improper Length and Hygiene: Yes Electronic Signature(s) Signed: 02/02/2017 5:27:52 PM By: Gretta Cool, BSN, RN, CWS, Kim RN, BSN Entered By: Gretta Cool, BSN, RN, CWS, Kim on 02/02/2017 10:36:14 [name] ([id number]) --------------------------------------------------------------------------------  Multi Wound Chart Details Patient Name: [name], Jillian B. Date of Service: 02/02/2017 10:15 AM Medical Record Number: [id number] Patient Account Number: [id number] Date of Birth/Sex: 09/13/1945 (72 y.o. Female) Treating RN: Cornell Barman Primary Care Gitel Beste: Lelon Huh Other Clinician: Referring Maurya Nethery: Lelon Huh Treating Ayinde Swim/Extender: Melburn Hake, HOYT Weeks in Treatment: 17 Vital Signs Height(in): 64 Pulse(bpm): 62 Weight(lbs): 209 Blood Pressure(mmHg): 128/72 Body Mass Index(BMI): 36 Temperature(F): 97.5 Respiratory Rate 16 (breaths/min): Photos: Wound Location: Left, Lateral Lower Leg Left, Medial Lower Leg Left, Posterior Lower Leg Wounding Event: Gradually Appeared Gradually Appeared Gradually Appeared Primary Etiology: 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 Lower Extremity Lower Extremity Date Acquired: 08/02/2016 01/26/2017 01/26/2017 Weeks of Treatment: 17 1 1  Wound Status: Open Open Open Clustered Wound: No Yes No Measurements L x W x D 2.8x3x0.21 0.5x1.5x0.1 0.5x0.5x0.1 (cm) Area (cm) : 6.597 0.589 0.196 Volume (cm) :  1.385 0.059 0.02 % Reduction in Area: 61.80% 96.00% -24.80% % Reduction in Volume: 19.80% 96.00% -25.00% Classification: Grade 2 Grade 2 Grade 2 Periwound Skin Texture: No Abnormalities Noted No Abnormalities Noted No Abnormalities Noted Periwound Skin Moisture: No Abnormalities Noted No Abnormalities Noted No Abnormalities Noted Periwound Skin Color: No Abnormalities Noted No Abnormalities Noted No Abnormalities Noted Tenderness on Palpation: No No No Treatment Notes Electronic Signature(s) Signed: 02/02/2017 5:27:52 PM By: Gretta Cool, BSN, RN, CWS, Kim RN, BSN Entered By: Gretta Cool, BSN, RN, CWS, Kim on 02/02/2017 10:38:01 [name] ([id number]) -------------------------------------------------------------------------------- [place] Details Patient Name: [name], [name]. Date of Service: 02/02/2017 10:15 AM Medical Record Number: [id number] Patient Account Number: [id number] Date of Birth/Sex: November 04, 1945 (72 y.o. Female) Treating RN: Cornell Barman Primary Care Adain Geurin: Lelon Huh Other Clinician: Referring Ayauna Mcnay: Lelon Huh Treating Darcell Sabino/Extender: Melburn Hake, HOYT Weeks in Treatment: 27 Active Inactive ` Abuse / Safety / Falls / Self Care Management Nursing Diagnoses: History of Falls Potential for falls Goals: Patient will not experience any injury related to falls Date Initiated: 10/02/2016 Target Resolution Date: 01/06/2017 Goal Status: Active Interventions: Assess: immobility, friction, shearing, incontinence upon admission and as needed Assess impairment of mobility on admission and as needed per policy Assess personal safety and home safety (as indicated) on admission and as needed Notes: ` Nutrition Nursing Diagnoses: Imbalanced nutrition Impaired glucose control: actual or potential Potential for alteratiion in Nutrition/Potential for imbalanced nutrition Goals: Patient/caregiver agrees to and verbalizes understanding of need to use  nutritional supplements and/or vitamins as prescribed Date Initiated: 10/02/2016 Target Resolution Date: 02/10/2017 Goal Status: Active Patient/caregiver will maintain therapeutic glucose control Date Initiated: 10/02/2016 Target Resolution Date: 01/06/2017 Goal Status: Active Interventions: Assess patient nutrition upon admission and as needed per policy Provide education on elevated blood sugars and impact on wound healing Provide education on nutrition Treatment Activities: Education provided on Nutrition : 10/02/2016 FLORENTINA, MARQUART ([id number]) Notes: ` Orientation to the Wound Care Program Nursing Diagnoses: Knowledge deficit related to the wound healing center program Goals: Patient/caregiver will verbalize understanding of the [place] Date Initiated: 10/02/2016 Target Resolution Date: 11/11/2016 Goal Status: Active Interventions: Provide education on orientation to the wound center Notes: ` Pain, Acute or Chronic Nursing Diagnoses: Pain, acute or chronic: actual or potential Potential alteration in comfort, pain Goals: Patient/caregiver will verbalize adequate pain control between visits Date Initiated: 10/02/2016 Target Resolution Date: 02/10/2017 Goal Status: Active Interventions: Complete pain assessment as per visit requirements Notes: ` Wound/Skin Impairment Nursing Diagnoses: Impaired tissue integrity Knowledge deficit related to ulceration/compromised skin integrity  Goals: Ulcer/skin breakdown will have a volume reduction of 80% by week 12 Date Initiated: 10/02/2016 Target Resolution Date: 02/03/2017 Goal Status: Active Interventions: Assess patient/caregiver ability to perform ulcer/skin care regimen upon admission and as needed Assess ulceration(s) every visit Notes: JAQUELINE, UBER ([id number]) Electronic Signature(s) Signed: 02/02/2017 5:27:52 PM By: Gretta Cool, BSN, RN, CWS, Kim RN, BSN Entered By: Gretta Cool, BSN, RN, CWS, Kim on 02/02/2017  10:36:19 [name] ([id number]) -------------------------------------------------------------------------------- Pain Assessment Details Patient Name: [name], Jillian B. Date of Service: 02/02/2017 10:15 AM Medical Record Number: [id number] Patient Account Number: [id number] Date of Birth/Sex: 08/03/1945 (72 y.o. Female) Treating RN: Cornell Barman Primary Care Daeshawn Redmann: Lelon Huh Other Clinician: Referring Narada Uzzle: Lelon Huh Treating Deissy Guilbert/Extender: Melburn Hake, HOYT Weeks in Treatment: 17 Active Problems Location of Pain Severity and Description of Pain Patient Has Paino No Site Locations With Dressing Change: No Pain Management and Medication Current Pain Management: Goals for Pain Management Topical or injectable lidocaine is offered to patient for acute pain when surgical debridement is performed. If needed, Patient is instructed to use over the counter pain medication for the following 24-48 hours after debridement. Wound care MDs do not prescribed pain medications. Patient has chronic pain or uncontrolled pain. Patient has been instructed to make an appointment with their Primary Care Physician for pain management. Electronic Signature(s) Signed: 02/02/2017 5:27:52 PM By: Gretta Cool, BSN, RN, CWS, Kim RN, BSN Entered By: Gretta Cool, BSN, RN, CWS, Kim on 02/02/2017 10:21:55 [name] ([id number]) -------------------------------------------------------------------------------- Patient/Caregiver Education Details Patient Name: [name], [name]. Date of Service: 02/02/2017 10:15 AM Medical Record Number: [id number] Patient Account Number: [id number] Date of Birth/Gender: May 31, 1945 (72 y.o. Female) Treating RN: Cornell Barman Primary Care Physician: Lelon Huh Other Clinician: Referring Physician: Lelon Huh Treating Physician/Extender: Sharalyn Ink in Treatment: 81 Education Assessment Education Provided To: Patient Education Topics Provided Wound/Skin  Impairment: Handouts: Caring for Your Ulcer Methods: Demonstration, Explain/Verbal Responses: State content correctly Electronic Signature(s) Signed: 02/02/2017 5:27:52 PM By: Gretta Cool, BSN, RN, CWS, Kim RN, BSN Entered By: Gretta Cool, BSN, RN, CWS, Kim on 02/02/2017 11:07:19 [name] ([id number]) -------------------------------------------------------------------------------- Wound Assessment Details Patient Name: [name], Jillian B. Date of Service: 02/02/2017 10:15 AM Medical Record Number: [id number] Patient Account Number: [id number] Date of Birth/Sex: 01-21-1945 (72 y.o. Female) Treating RN: Cornell Barman Primary Care Raffaele Derise: Lelon Huh Other Clinician: Referring [place] Searles: Lelon Huh Treating Adriel Kessen/Extender: Melburn Hake, HOYT Weeks in Treatment: 17 Wound Status Wound Number: 1 Primary Diabetic Wound/Ulcer of the Lower Etiology: Extremity Wound Location: Left, Lateral Lower Leg Wound Status: Open Wounding Event: Gradually Appeared Date Acquired: 08/02/2016 Weeks Of Treatment: 17 Clustered Wound: No Photos Photo Uploaded By: Gretta Cool, BSN, RN, CWS, Kim on 02/02/2017 10:36:58 Wound Measurements Length: (cm) 2.8 Width: (cm) 3 Depth: (cm) 0.21 Area: (cm) 6.597 Volume: (cm) 1.385 % Reduction in Area: 61.8% % Reduction in Volume: 19.8% Wound Description Classification: Grade 2 Periwound Skin Texture Texture Color No Abnormalities Noted: No No Abnormalities Noted: No Moisture No Abnormalities Noted: No Treatment Notes Wound #1 (Left, Lateral Lower Leg) 1. Cleansed with: Clean wound with Normal Saline 2. Anesthetic Topical Lidocaine 4% cream to wound bed prior to debridement 4. Dressing Applied: Hydrafera Blue 5. Secondary Dressing Applied ABD Pad [name], Jillian B. ([id number]) 7. Secured with 3 Layer Compression System - Left Lower Extremity Electronic Signature(s) Signed: 02/02/2017 5:27:52 PM By: Gretta Cool, BSN, RN, CWS, Kim RN, BSN Entered By: Gretta Cool, BSN, RN, CWS, Kim  on 02/02/2017 10:34:16 [name] ([id number]) -------------------------------------------------------------------------------- Wound Assessment Details Patient  Name: Thum, Kelly[place] Date of Service: 02/02/2017 10:15 AM Medical Record Number: [id number] Patient Account Number: [id number] Date of Birth/Sex: 10-Oct-1945 (72 y.o. Female) Treating RN: Cornell Barman Primary Care Dakota Stangl: Lelon Huh Other Clinician: Referring Adiel Erney: Lelon Huh Treating Saurav Crumble/Extender: Melburn Hake, HOYT Weeks in Treatment: 17 Wound Status Wound Number: 11 Primary Diabetic Wound/Ulcer of the Lower Etiology: Extremity Wound Location: Left, Medial Lower Leg Wound Status: Open Wounding Event: Gradually Appeared Date Acquired: 01/26/2017 Weeks Of Treatment: 1 Clustered Wound: Yes Photos Photo Uploaded By: Gretta Cool, BSN, RN, CWS, Kim on 02/02/2017 10:36:59 Wound Measurements Length: (cm) 0.5 Width: (cm) 1.5 Depth: (cm) 0.1 Area: (cm) 0.589 Volume: (cm) 0.059 % Reduction in Area: 96% % Reduction in Volume: 96% Wound Description Classification: Grade 2 Periwound Skin Texture Texture Color No Abnormalities Noted: No No Abnormalities Noted: No Moisture No Abnormalities Noted: No Treatment Notes Wound #11 (Left, Medial Lower Leg) 1. Cleansed with: Clean wound with Normal Saline 2. Anesthetic Topical Lidocaine 4% cream to wound bed prior to debridement 4. Dressing Applied: Hydrafera Blue 5. Secondary Dressing Applied ABD Pad GENOA, FREYRE B. ([id number]) 7. Secured with 3 Layer Compression System - Left Lower Extremity Electronic Signature(s) Signed: 02/02/2017 5:27:52 PM By: Gretta Cool, BSN, RN, CWS, Kim RN, BSN Entered By: Gretta Cool, BSN, RN, CWS, Kim on 02/02/2017 10:34:17 [name] ([id number]) -------------------------------------------------------------------------------- Wound Assessment Details Patient Name: CARYLON, TAMBURRO B. Date of Service: 02/02/2017 10:15 AM Medical Record Number:  [id number] Patient Account Number: [id number] Date of Birth/Sex: 04-Jan-1945 (72 y.o. Female) Treating RN: Cornell Barman Primary Care Rickiya Picariello: Lelon Huh Other Clinician: Referring Tamkia Temples: Lelon Huh Treating Draycen Leichter/Extender: Melburn Hake, HOYT Weeks in Treatment: 17 Wound Status Wound Number: 12 Primary Diabetic Wound/Ulcer of the Lower Etiology: Extremity Wound Location: Left, Posterior Lower Leg Wound Status: Healed - Epithelialized Wounding Event: Gradually Appeared Date Acquired: 01/26/2017 Weeks Of Treatment: 1 Clustered Wound: No Photos Photo Uploaded By: Gretta Cool, BSN, RN, CWS, Kim on 02/02/2017 10:37:11 Wound Measurements Length: (cm) 0 % Redu Width: (cm) 0 % Redu Depth: (cm) 0 Area: (cm) 0 Volume: (cm) 0 ction in Area: 100% ction in Volume: 100% Wound Description Classification: Grade 2 Periwound Skin Texture Texture Color No Abnormalities Noted: No No Abnormalities Noted: No Moisture No Abnormalities Noted: No Electronic Signature(s) Signed: 02/02/2017 5:27:52 PM By: Gretta Cool, BSN, RN, CWS, Kim RN, BSN Entered By: Gretta Cool, BSN, RN, CWS, Kim on 02/02/2017 10:53:27 [name] ([id number]) -------------------------------------------------------------------------------- Vitals Details Patient Name: Jillian Albe B. Date of Service: 02/02/2017 10:15 AM Medical Record Number: [id number] Patient Account Number: [id number] Date of Birth/Sex: 05-06-1945 (72 y.o. Female) Treating RN: Cornell Barman Primary Care Tashaun Obey: Lelon Huh Other Clinician: Referring Cailen Mihalik: Lelon Huh Treating Welcome Fults/Extender: Melburn Hake, HOYT Weeks in Treatment: 17 Vital Signs Time Taken: 10:21 Temperature (F): 97.5 Height (in): 64 Pulse (bpm): 62 Weight (lbs): 209 Respiratory Rate (breaths/min): 16 Body Mass Index (BMI): 35.9 Blood Pressure (mmHg): 128/72 Reference Range: 80 - 120 mg / dl Electronic Signature(s) Signed: 02/02/2017 5:27:52 PM By: Gretta Cool, BSN, RN, CWS, Kim RN,  BSN Entered By: Gretta Cool, BSN, RN, CWS, Kim on 02/02/2017 10:22:12

## 2017-02-04 NOTE — Progress Notes (Signed)
AMARYLLIS, MALMQUIST ([id number]) Visit Report for 02/02/2017 Chief Complaint Document Details Patient Name: [name], [name]. Date of Service: 02/02/2017 10:15 AM Medical Record Number: [id number] Patient Account Number: [id number] Date of Birth/Sex: 01-21-1945 (72 y.o. Female) Treating RN: Cornell Barman Primary Care Provider: Lelon Huh Other Clinician: Referring Provider: Lelon Huh Treating Provider/Extender: Melburn Hake, HOYT Weeks in Treatment: 17 Information Obtained from: Patient Chief Complaint Patient seen for complaints of Non-Healing Wounds to both lower extremities Electronic Signature(s) Signed: 02/02/2017 12:52:48 PM By: Worthy Keeler PA-C Entered By: Worthy Keeler on 02/02/2017 10:45:02 [name] ([id number]) -------------------------------------------------------------------------------- HPI Details Patient Name: [name]. Date of Service: 02/02/2017 10:15 AM Medical Record Number: [id number] Patient Account Number: [id number] Date of Birth/Sex: 05-Oct-1945 (72 y.o. Female) Treating RN: Cornell Barman Primary Care Provider: Lelon Huh Other Clinician: Referring Provider: Lelon Huh Treating Provider/Extender: Melburn Hake, HOYT Weeks in Treatment: 17 History of Present Illness Location: both lower extremity swelling with ulceration HPI Description: 72 year old patient who sees her PCP Dr. Lelon Huh was recently evaluated 10 days ago for diabetes mellitus, hypertension, CHF and hyperlipidemia. she also was noted to have ulcerations develop in her legs and she has been applying Silvadene dressings locally. In the past she has refused wound care referrals.her cardiologist Dr. Saunders Revel saw her and put her on 40 mg of furosemide daily. last hemoglobin A1c was 7.7%. she was also placed on ciprofloxacin twice daily for 7 days and a urine culture was recommended. past medical history significant for coronary artery disease, diabetes mellitus, nonischemic cardiomyopathy and  pulmonary hypertension. She is also status post heart catheterization and coronary angiography, tubal ligation and breast cyst removal in the past. She has never been a smoker. Patient had arterial studies done which showed bilateral ABIs are artificially elevated due to noncompressible and calcified vessels. Triphasic waveform throughout. Right great toe TBI is elevated while the left is normal. 10/23/2016 -- the patient is rather moribund from several issues including chronic back pain and knee pain and swelling of her legs. The large necrotic area on her left lateral anterior calf was bleeding on touch after washing her leg. There was a spot which needed silver nitrate cauterization and this was done appropriately. 11/06/2016 -- she is again noted to have friable bleeding from the left lower extremity wounds and this again had to be cauterized with silver nitrate to control the bleeding as pressure itself would not do it. 11/20/2016 -- the right lower extremity is completely healed and we have ordered 30-40 mm compression stocking's in both the dural layer and also a pair of juxta lites. 12/18/2016 -- she has not been wearing her compression stockings on her right leg and these have opened out into ulcerations again. Her left lower extremity has circumferential ulcerations now. I believe at this stage I would like to get her venous reflux studies done to make certain that there is no fixable superficial venous reflux. 12/29/2016 -- she has made an excellent recovery having continued with her appropriate doses of diuretics and elevation and exercise. She has not yet received her right lower extremity juxta lites. Her venous duplex study is at the end of January 01/13/16 on evaluation today patient's wounds appeared to be overall doing much better much less hyper granular than previous week's evaluation. The Hydrofera Blue Dressing's to be doing very well. She does tell me that she is having a  little bit more discomfort in the posterior aspect of her leg where she has a wound  at this point. Fortunately there appears to be no infection. 01/19/17 on evaluation today patient appears to be doing very well in regard to her bilateral lower extremity swelling and at this point in time her left lower extremity ulcers. Her wounds appear to be doing much better. She has been tolerating the dressing's at this point fortunately she did get the Juxta-Lite compression as of today as well. Nonetheless I am pleased that she has been tolerating everything so well and that her wounds looks so good. In fact she is an excellent granular surface no evidence of slough covering and I do not see any reason for likely debridement today. Especially in regard to the posterior leg. 01/26/17 on evaluation today patient appears to be doing decently well in regard to her wounds. The surface of the majority of her wounds is greatly improved. She has been using the Juxta-Lite compression which is excellent. Nonetheless the wound of the left posterior calf did require some debridement today otherwise the majority of the wounds did not require any debridement. The this was due to slough buildup on the surface. She also has a small skin flap where two of the ulcers actually connected and this has loosened up I'm concerned that things may not heal well with that flap but for the time being I'm not gonna do anything different in that regard. MATTIE, Watson ([id number]) 02/02/17 on evaluation today patient appears to be doing excellent in regard to her lower extremity ulcers. She has been tolerating the dressing changes including the wraps without complication. with that being said she is having much less discomfort, there is no slough over the wound, and the posterior wound appears to have healed following evaluation today. Overall I am extremely pleased with how things are doing. Patient as well as her husband likewise are  also very pleased. Electronic Signature(s) Signed: 02/02/2017 12:52:48 PM By: Worthy Keeler PA-C Entered By: Worthy Keeler on 02/02/2017 11:01:06 [name] ([id number]) -------------------------------------------------------------------------------- Physical Exam Details Patient Name: SKYLINN, VIALPANDO B. Date of Service: 02/02/2017 10:15 AM Medical Record Number: [id number] Patient Account Number: [id number] Date of Birth/Sex: Nov 21, 1945 (72 y.o. Female) Treating RN: Cornell Barman Primary Care Provider: Lelon Huh Other Clinician: Referring Provider: Lelon Huh Treating Provider/Extender: Melburn Hake, HOYT Weeks in Treatment: 17 Constitutional Obese and well-hydrated in no acute distress. Respiratory normal breathing without difficulty. clear to auscultation bilaterally. Cardiovascular regular rate and rhythm with normal S1, S2. 1+ pitting edema of the bilateral lower extremities. Psychiatric this patient is able to make decisions and demonstrates good insight into disease process. Alert and Oriented x 3. pleasant and cooperative. Notes Patient's wound beds show excellent granulation but no evidence of infection and no significant slough buildup. Overall she seems to be doing very well with the [facility] Dressing. Electronic Signature(s) Signed: 02/02/2017 12:52:48 PM By: Worthy Keeler PA-C Entered By: Worthy Keeler on 02/02/2017 11:01:57 [name] ([id number]) -------------------------------------------------------------------------------- Physician Orders Details Patient Name: ASHEA, WINIARSKI B. Date of Service: 02/02/2017 10:15 AM Medical Record Number: [id number] Patient Account Number: [id number] Date of Birth/Sex: January 15, 1945 (72 y.o. Female) Treating RN: Cornell Barman Primary Care Provider: Lelon Huh Other Clinician: Referring Provider: Lelon Huh Treating Provider/Extender: Melburn Hake, HOYT Weeks in Treatment: 44 Verbal / Phone Orders: No Diagnosis  Coding ICD-10 Coding Code Description E11.622 Type 2 diabetes mellitus with other skin ulcer I89.0 Lymphedema, not elsewhere classified L97.212 Non-pressure chronic ulcer of right calf with fat layer exposed L97.222 Non-pressure chronic ulcer of  left calf with fat layer exposed Wound Cleansing Wound #1 Left,Lateral Lower Leg o Clean wound with Normal Saline. o Cleanse wound with mild soap and water o May Shower, gently pat wound dry prior to applying new dressing. Wound #11 Left,Medial Lower Leg o Clean wound with Normal Saline. o Cleanse wound with mild soap and water o May Shower, gently pat wound dry prior to applying new dressing. Anesthetic (add to Medication List) Wound #1 Left,Lateral Lower Leg o Topical Lidocaine 4% cream applied to wound bed prior to debridement (In Clinic Only). Wound #11 Left,Medial Lower Leg o Topical Lidocaine 4% cream applied to wound bed prior to debridement (In Clinic Only). Primary Wound Dressing Wound #1 Left,Lateral Lower Leg o Hydrafera Blue - Please make sure this is Hydrafera Blue Ready Transfer, Pt has a package that it came in make sure you get cover the wound on posterior leg as well Wound #11 Left,Medial Lower Leg o Hydrafera Blue - Please make sure this is Hydrafera Blue Ready Transfer, Pt has a package that it came in make sure you get cover the wound on posterior leg as well Secondary Dressing Wound #1 Left,Lateral Lower Leg o ABD pad Wound #11 Left,Medial Lower Leg o ABD pad Dressing Change Frequency KAISEY, HUSEBY ([id number]) Wound #1 Left,Lateral Lower Leg o Change Dressing Monday, Wednesday, Friday - HHRN to change Monday, Wednesday and Fridays and pt to come to [place] on Fridays. Wound #11 Left,Medial Lower Leg o Change Dressing Monday, Wednesday, Friday - HHRN to change Monday, Wednesday and Fridays and pt to come to [place] on Fridays. Follow-up Appointments Wound #1  Left,Lateral Lower Leg o Return Appointment in 1 week. Wound #11 Left,Medial Lower Leg o Return Appointment in 1 week. Edema Control Wound #1 Left,Lateral Lower Leg o 3 Layer Compression System - Left Lower Extremity - ****Make sure you are only putting a 3-layer wrap on the left leg**** wrap up to 3 finger widths below the knee. o Patient to wear own Juxtalite/Juzo compression garment. - Please educate patient and spouse about juxtalite application and use o Elevate legs to the level of the heart and pump ankles as often as possible Additional Orders / Instructions Wound #1 Left,Lateral Lower Leg o Vitamin A; Vitamin C, Zinc o Increase protein intake. Wound #11 Left,Medial Lower Leg o Vitamin A; Vitamin C, Zinc o Increase protein intake. Home Health Wound #1 Left,Lateral Lower Leg o Continue Home Health Visits - Please educate patient and spouse about juxtalite application and use o Home Health Nurse may visit PRN to address patientos wound care needs. o FACE TO FACE ENCOUNTER: MEDICARE and MEDICAID PATIENTS: I certify that this patient is under my care and that I had a face-to-face encounter that meets the physician face-to-face encounter requirements with this patient on this date. The encounter with the patient was in whole or in part for the following MEDICAL CONDITION: (primary reason for [place]) MEDICAL NECESSITY: I certify, that based on my findings, NURSING services are a medically necessary home health service. HOME BOUND STATUS: I certify that my clinical findings support that this patient is homebound (i.e., Due to illness or injury, pt requires aid of supportive devices such as crutches, cane, wheelchairs, walkers, the use of special transportation or the assistance of another person to leave their place of residence. There is a normal inability to leave the home and doing so requires considerable and taxing effort. Other absences are for  medical reasons / religious services  and are infrequent or of short duration when for other reasons). o If current dressing causes regression in wound condition, may D/C ordered dressing product/s and apply Normal Saline Moist Dressing daily until next [place] / Other MD appointment. [place] of regression in wound condition at [phone]. o Please direct any NON-WOUND related issues/requests for orders to patient's Primary Care Physician Wound #11 Left,Medial Lower Leg o [place] Visits - Please educate patient and spouse about juxtalite application and use o Home Health Nurse may visit PRN to address patientos wound care needs. o FACE TO FACE ENCOUNTER: MEDICARE and MEDICAID PATIENTS: I certify that this patient is under my care and that I had a face-to-face encounter that meets the physician face-to-face encounter requirements with this TRECIA, MARING ([id number]) patient on this date. The encounter with the patient was in whole or in part for the following MEDICAL CONDITION: (primary reason for [place]) MEDICAL NECESSITY: I certify, that based on my findings, NURSING services are a medically necessary home health service. HOME BOUND STATUS: I certify that my clinical findings support that this patient is homebound (i.e., Due to illness or injury, pt requires aid of supportive devices such as crutches, cane, wheelchairs, walkers, the use of special transportation or the assistance of another person to leave their place of residence. There is a normal inability to leave the home and doing so requires considerable and taxing effort. Other absences are for medical reasons / religious services and are infrequent or of short duration when for other reasons). o If current dressing causes regression in wound condition, may D/C ordered dressing product/s and apply Normal Saline Moist Dressing daily until next [place] /  Other MD appointment. [place] of regression in wound condition at [phone]. o Please direct any NON-WOUND related issues/requests for orders to patient's Primary Care Physician Electronic Signature(s) Signed: 02/02/2017 12:52:48 PM By: Worthy Keeler PA-C Signed: 02/02/2017 5:27:52 PM By: Gretta Cool, BSN, RN, CWS, Kim RN, BSN Entered By: Gretta Cool, BSN, RN, CWS, Kim on 02/02/2017 10:54:35 [name] ([id number]) -------------------------------------------------------------------------------- Problem List Details Patient Name: JORDANN, GRIME. Date of Service: 02/02/2017 10:15 AM Medical Record Number: [id number] Patient Account Number: [id number] Date of Birth/Sex: 01/31/1945 (72 y.o. Female) Treating RN: Cornell Barman Primary Care Provider: Lelon Huh Other Clinician: Referring Provider: Lelon Huh Treating Provider/Extender: Melburn Hake, HOYT Weeks in Treatment: 17 Active Problems ICD-10 Encounter Code Description Active Date Diagnosis E11.622 Type 2 diabetes mellitus with other skin ulcer 10/02/2016 Yes I89.0 Lymphedema, not elsewhere classified 10/02/2016 Yes L97.212 Non-pressure chronic ulcer of right calf with fat layer exposed 10/02/2016 Yes L97.222 Non-pressure chronic ulcer of left calf with fat layer exposed 10/02/2016 Yes Inactive Problems Resolved Problems Electronic Signature(s) Signed: 02/02/2017 12:52:48 PM By: Worthy Keeler PA-C Entered By: Worthy Keeler on 02/02/2017 10:43:29 [name] ([id number]) -------------------------------------------------------------------------------- Progress Note Details Patient Name: [name] Date of Service: 02/02/2017 10:15 AM Medical Record Number: [id number] Patient Account Number: [id number] Date of Birth/Sex: 30-Apr-1945 (72 y.o. Female) Treating RN: Cornell Barman Primary Care Provider: Lelon Huh Other Clinician: Referring Provider: Lelon Huh Treating Provider/Extender: Melburn Hake, HOYT Weeks  in Treatment: 17 Subjective Chief Complaint Information obtained from Patient Patient seen for complaints of Non-Healing Wounds to both lower extremities History of Present Illness (HPI) The following HPI elements were documented for the patient's wound: Location: both lower extremity swelling with ulceration 72 year old patient who sees her PCP Dr. Lelon Huh was  recently evaluated 10 days ago for diabetes mellitus, hypertension, CHF and hyperlipidemia. she also was noted to have ulcerations develop in her legs and she has been applying Silvadene dressings locally. In the past she has refused wound care referrals.her cardiologist Dr. Saunders Revel saw her and put her on 40 mg of furosemide daily. last hemoglobin A1c was 7.7%. she was also placed on ciprofloxacin twice daily for 7 days and a urine culture was recommended. past medical history significant for coronary artery disease, diabetes mellitus, nonischemic cardiomyopathy and pulmonary hypertension. She is also status post heart catheterization and coronary angiography, tubal ligation and breast cyst removal in the past. She has never been a smoker. Patient had arterial studies done which showed bilateral ABIs are artificially elevated due to noncompressible and calcified vessels. Triphasic waveform throughout. Right great toe TBI is elevated while the left is normal. 10/23/2016 -- the patient is rather moribund from several issues including chronic back pain and knee pain and swelling of her legs. The large necrotic area on her left lateral anterior calf was bleeding on touch after washing her leg. There was a spot which needed silver nitrate cauterization and this was done appropriately. 11/06/2016 -- she is again noted to have friable bleeding from the left lower extremity wounds and this again had to be cauterized with silver nitrate to control the bleeding as pressure itself would not do it. 11/20/2016 -- the right lower extremity is  completely healed and we have ordered 30-40 mm compression stocking's in both the dural layer and also a pair of juxta lites. 12/18/2016 -- she has not been wearing her compression stockings on her right leg and these have opened out into ulcerations again. Her left lower extremity has circumferential ulcerations now. I believe at this stage I would like to get her venous reflux studies done to make certain that there is no fixable superficial venous reflux. 12/29/2016 -- she has made an excellent recovery having continued with her appropriate doses of diuretics and elevation and exercise. She has not yet received her right lower extremity juxta lites. Her venous duplex study is at the end of January 01/13/16 on evaluation today patient's wounds appeared to be overall doing much better much less hyper granular than previous week's evaluation. The Hydrofera Blue Dressing's to be doing very well. She does tell me that she is having a little bit more discomfort in the posterior aspect of her leg where she has a wound at this point. Fortunately there appears to be no infection. 01/19/17 on evaluation today patient appears to be doing very well in regard to her bilateral lower extremity swelling and at this point in time her left lower extremity ulcers. Her wounds appear to be doing much better. She has been tolerating the dressing's at this point fortunately she did get the Juxta-Lite compression as of today as well. Nonetheless I am pleased that she has been tolerating everything so well and that her wounds looks so good. In fact she is an excellent granular surface no evidence of slough covering and I do not see any reason for likely debridement today. Especially in regard to the posterior [place], Megan B. ([id number]) leg. 01/26/17 on evaluation today patient appears to be doing decently well in regard to her wounds. The surface of the majority of her wounds is greatly improved. She has been using the  Juxta-Lite compression which is excellent. Nonetheless the wound of the left posterior calf did require some debridement today otherwise the majority of the  wounds did not require any debridement. The this was due to slough buildup on the surface. She also has a small skin flap where two of the ulcers actually connected and this has loosened up I'm concerned that things may not heal well with that flap but for the time being I'm not gonna do anything different in that regard. 02/02/17 on evaluation today patient appears to be doing excellent in regard to her lower extremity ulcers. She has been tolerating the dressing changes including the wraps without complication. with that being said she is having much less discomfort, there is no slough over the wound, and the posterior wound appears to have healed following evaluation today. Overall I am extremely pleased with how things are doing. Patient as well as her husband likewise are also very pleased. Patient History Information obtained from Patient. Family History Cancer - Mother,Siblings, Diabetes - Mother,Siblings, Heart Disease - Mother, Hypertension - Siblings, No family history of Hereditary Spherocytosis, Kidney Disease, Lung Disease, Seizures, Stroke, Thyroid Problems, Tuberculosis. Social History Never smoker, Marital Status - Married, Alcohol Use - Never, Drug Use - No History, Caffeine Use - Daily. Review of Systems (ROS) Constitutional Symptoms (General Health) Denies complaints or symptoms of Fever, Chills. Respiratory The patient has no complaints or symptoms. Cardiovascular Complains or has symptoms of LE edema. Psychiatric The patient has no complaints or symptoms. Objective Constitutional Obese and well-hydrated in no acute distress. Vitals Time Taken: 10:21 AM, Height: 64 in, Weight: 209 lbs, BMI: 35.9, Temperature: 97.5 F, Pulse: 62 bpm, Respiratory Rate: 16 breaths/min, Blood Pressure: 128/72  mmHg. Respiratory normal breathing without difficulty. clear to auscultation bilaterally. Cardiovascular regular rate and rhythm with normal S1, S2. 1+ pitting edema of the bilateral lower extremities. KASIDEE, VOISIN ([id number]) Psychiatric this patient is able to make decisions and demonstrates good insight into disease process. Alert and Oriented x 3. pleasant and cooperative. General Notes: Patient's wound beds show excellent granulation but no evidence of infection and no significant slough buildup. Overall she seems to be doing very well with the [facility] Dressing. Integumentary (Hair, Skin) Wound #1 status is Open. Original cause of wound was Gradually Appeared. The wound is located on the Left,Lateral Lower Leg. The wound measures 2.8cm length x 3cm width x 0.21cm depth; 6.597cm^2 area and 1.385cm^3 volume. Wound #11 status is Open. Original cause of wound was Gradually Appeared. The wound is located on the Left,Medial Lower Leg. The wound measures 0.5cm length x 1.5cm width x 0.1cm depth; 0.589cm^2 area and 0.059cm^3 volume. Wound #12 status is Healed - Epithelialized. Original cause of wound was Gradually Appeared. The wound is located on the Left,Posterior Lower Leg. The wound measures 0cm length x 0cm width x 0cm depth; 0cm^2 area and 0cm^3 volume. Assessment Active Problems ICD-10 E11.622 - Type 2 diabetes mellitus with other skin ulcer I89.0 - Lymphedema, not elsewhere classified L97.212 - Non-pressure chronic ulcer of right calf with fat layer exposed L97.222 - Non-pressure chronic ulcer of left calf with fat layer exposed Procedures Wound #1 Pre-procedure diagnosis of Wound #1 is a Diabetic Wound/Ulcer of the Lower Extremity located on the Left,Lateral Lower Leg . There was a Three Layer Compression Therapy Procedure by Cornell Barman, RN. Post procedure Diagnosis Wound #1: Same as Pre-Procedure Plan Wound Cleansing: Wound #1 Left,Lateral Lower Leg: Clean wound with  Normal Saline. Cleanse wound with mild soap and water May Shower, gently pat wound dry prior to applying new dressing. Wound #11 Left,Medial Lower Leg: Clean wound with Normal Saline.  Cleanse wound with mild soap and water LORRAIN, RIVERS ([id number]) May Shower, gently pat wound dry prior to applying new dressing. Anesthetic (add to Medication List): Wound #1 Left,Lateral Lower Leg: Topical Lidocaine 4% cream applied to wound bed prior to debridement (In Clinic Only). Wound #11 Left,Medial Lower Leg: Topical Lidocaine 4% cream applied to wound bed prior to debridement (In Clinic Only). Primary Wound Dressing: Wound #1 Left,Lateral Lower Leg: Hydrafera Blue - Please make sure this is Hydrafera Blue Ready Transfer, Pt has a package that it came in make sure you get cover the wound on posterior leg as well Wound #11 Left,Medial Lower Leg: Hydrafera Blue - Please make sure this is Hydrafera Blue Ready Transfer, Pt has a package that it came in make sure you get cover the wound on posterior leg as well Secondary Dressing: Wound #1 Left,Lateral Lower Leg: ABD pad Wound #11 Left,Medial Lower Leg: ABD pad Dressing Change Frequency: Wound #1 Left,Lateral Lower Leg: Change Dressing Monday, Wednesday, Friday - HHRN to change Monday, Wednesday and Fridays and pt to come to [facility] on Fridays. Wound #11 Left,Medial Lower Leg: Change Dressing Monday, Wednesday, Friday - HHRN to change Monday, Wednesday and Fridays and pt to come to [facility] on Fridays. Follow-up Appointments: Wound #1 Left,Lateral Lower Leg: Return Appointment in 1 week. Wound #11 Left,Medial Lower Leg: Return Appointment in 1 week. Edema Control: Wound #1 Left,Lateral Lower Leg: 3 Layer Compression System - Left Lower Extremity - ****Make sure you are only putting a 3-layer wrap on the left leg**** wrap up to 3 finger widths below the knee. Patient to wear own Juxtalite/Juzo compression garment. -  Please educate patient and spouse about juxtalite application and use Elevate legs to the level of the heart and pump ankles as often as possible Additional Orders / Instructions: Wound #1 Left,Lateral Lower Leg: Vitamin A; Vitamin C, Zinc Increase protein intake. Wound #11 Left,Medial Lower Leg: Vitamin A; Vitamin C, Zinc Increase protein intake. Home Health: Wound #1 Left,Lateral Lower Leg: Continue Home Health Visits - Please educate patient and spouse about juxtalite application and use Home Health Nurse may visit PRN to address patient s wound care needs. FACE TO FACE ENCOUNTER: MEDICARE and MEDICAID PATIENTS: I certify that this patient is under my care and that I had a face-to-face encounter that meets the physician face-to-face encounter requirements with this patient on this date. The encounter with the patient was in whole or in part for the following MEDICAL CONDITION: (primary reason for [place]) MEDICAL NECESSITY: I certify, that based on my findings, NURSING services are a medically necessary home health service. HOME BOUND STATUS: I certify that my clinical findings support that this patient is homebound (i.e., Due to illness or injury, pt requires aid of supportive devices such as crutches, cane, wheelchairs, walkers, the use of special transportation or the assistance of another person to leave their place of residence. There is a normal inability to leave the home and doing so requires considerable and taxing effort. Other absences are for medical reasons / religious services and are infrequent or of short duration when for other reasons). If current dressing causes regression in wound condition, may D/C ordered dressing product/s and apply Normal Saline Moist Dressing daily until next [place] / Other MD appointment. Henrietta of regression in wound condition at [phone]. GENOLA, YUILLE ([id number]) Please direct any NON-WOUND  related issues/requests for orders to patient's Primary Care Physician Wound #11 Left,Medial  Lower Leg: Continue Home Health Visits - Please educate patient and spouse about juxtalite application and use Home Health Nurse may visit PRN to address patient s wound care needs. FACE TO FACE ENCOUNTER: MEDICARE and MEDICAID PATIENTS: I certify that this patient is under my care and that I had a face-to-face encounter that meets the physician face-to-face encounter requirements with this patient on this date. The encounter with the patient was in whole or in part for the following MEDICAL CONDITION: (primary reason for [place]) MEDICAL NECESSITY: I certify, that based on my findings, NURSING services are a medically necessary home health service. HOME BOUND STATUS: I certify that my clinical findings support that this patient is homebound (i.e., Due to illness or injury, pt requires aid of supportive devices such as crutches, cane, wheelchairs, walkers, the use of special transportation or the assistance of another person to leave their place of residence. There is a normal inability to leave the home and doing so requires considerable and taxing effort. Other absences are for medical reasons / religious services and are infrequent or of short duration when for other reasons). If current dressing causes regression in wound condition, may D/C ordered dressing product/s and apply Normal Saline Moist Dressing daily until next [place] / Other MD appointment. Reynolds of regression in wound condition at [phone]. Please direct any NON-WOUND related issues/requests for orders to patient's Primary Care Physician Going to recommend that we continue with the Current wound care measures for the next week. Hopefully patient will continue to show signs of excellent improvement as she has up to this point. They are in agreement with the plan. Please see above for specific  wound care orders. We will see patient for re-evaluation in 1 week(s) here in the clinic. If anything worsens or changes patient will contact our office for additional recommendations. Electronic Signature(s) Signed: 02/02/2017 12:52:48 PM By: Worthy Keeler PA-C Entered By: Worthy Keeler on 02/02/2017 11:02:21 [name] ([id number]) -------------------------------------------------------------------------------- ROS/PFSH Details Patient Name: MINDI, AKERSON B. Date of Service: 02/02/2017 10:15 AM Medical Record Number: [id number] Patient Account Number: [id number] Date of Birth/Sex: 01/15/1945 (72 y.o. Female) Treating RN: Cornell Barman Primary Care Provider: Lelon Huh Other Clinician: Referring Provider: Lelon Huh Treating Provider/Extender: Melburn Hake, HOYT Weeks in Treatment: 17 Information Obtained From Patient Wound History Do you currently have one or more open woundso Yes How many open wounds do you currently haveo 6 Approximately how long have you had your woundso 2 months How have you been treating your wound(s) until nowo [place] Has your wound(s) ever healed and then re-openedo No Have you had any lab work done in the past montho No Have you tested positive for an antibiotic resistant organism (MRSA, VRE)o No Have you tested positive for osteomyelitis (bone infection)o No Have you had any tests for circulation on your legso Yes Where was the test [facility] Have you had other problems associated with your woundso Infection, Swelling Constitutional Symptoms (General Health) Complaints and Symptoms: Negative for: Fever; Chills Cardiovascular Complaints and Symptoms: Positive for: LE edema Medical History: Positive for: Congestive Heart Failure; Coronary Artery Disease; Hypertension Respiratory Complaints and Symptoms: No Complaints or Symptoms Medical History: Positive for: Asthma Endocrine Medical History: Positive for: Type II Diabetes Time with  diabetes: a long time Treated with: Oral agents Blood sugar tested every day: No Musculoskeletal EVELY, GAINEY ([id number]) Medical History: Positive for: Osteoarthritis Psychiatric Complaints and Symptoms: No Complaints or Symptoms Immunizations Pneumococcal  Vaccine: Received Pneumococcal Vaccination: Yes Implantable Devices Family and Social History Cancer: Yes - Mother,Siblings; Diabetes: Yes - Mother,Siblings; Heart Disease: Yes - Mother; Hereditary Spherocytosis: No; Hypertension: Yes - Siblings; Kidney Disease: No; Lung Disease: No; Seizures: No; Stroke: No; Thyroid Problems: No; Tuberculosis: No; Never smoker; Marital Status - Married; Alcohol Use: Never; Drug Use: No History; Caffeine Use: Daily; Financial Concerns: No; Food, Clothing or Shelter Needs: No; Support System Lacking: No; Transportation Concerns: No; Advanced Directives: No; Patient does not want information on Advanced Directives; Do not resuscitate: No; Living Will: No; Medical Power of Attorney: No Physician Affirmation I have reviewed and agree with the above information. Electronic Signature(s) Signed: 02/02/2017 12:52:48 PM By: Worthy Keeler PA-C Signed: 02/02/2017 5:27:52 PM By: Gretta Cool, BSN, RN, CWS, Kim RN, BSN Entered By: Worthy Keeler on 02/02/2017 11:01:26 [name] ([id number]) -------------------------------------------------------------------------------- SuperBill Details Patient Name: ARIE, GABLE. Date of Service: 02/02/2017 Medical Record Number: [id number] Patient Account Number: [id number] Date of Birth/Sex: 04-23-1945 (72 y.o. 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 Facility Procedures CPT4 Code: 32355732 Description: (Facility Use Only) [phone] - [place] LWR LT LEG Modifier: Quantity: 1 Physician Procedures CPT4 Code: 0623762 Description: 83151 - WC PHYS LEVEL 3 - EST PT ICD-10 Diagnosis Description E11.622 Type 2 diabetes mellitus with other skin ulcer I89.0 Lymphedema, not elsewhere classified L97.212 Non-pressure chronic ulcer of right calf with fat layer exp L97.222  Non-pressure chronic ulcer of left calf with fat layer expo Modifier: osed sed Quantity: 1 Electronic Signature(s) Signed: 02/02/2017 1:07:17 PM By: Gretta Cool, BSN, RN, CWS, Kim RN, BSN Signed: 02/02/2017 4:48:53 PM By: Worthy Keeler PA-C Previous Signature: 02/02/2017 12:52:48 PM Version By: Worthy Keeler PA-C Entered By: Gretta Cool, BSN, RN, CWS, Kim on 02/02/2017 13:07:17

## 2017-02-05 DIAGNOSIS — E11621 Type 2 diabetes mellitus with foot ulcer: Secondary | ICD-10-CM | POA: Diagnosis not present

## 2017-02-05 DIAGNOSIS — Z794 Long term (current) use of insulin: Secondary | ICD-10-CM | POA: Diagnosis not present

## 2017-02-05 DIAGNOSIS — L97512 Non-pressure chronic ulcer of other part of right foot with fat layer exposed: Secondary | ICD-10-CM | POA: Diagnosis not present

## 2017-02-05 DIAGNOSIS — L97522 Non-pressure chronic ulcer of other part of left foot with fat layer exposed: Secondary | ICD-10-CM | POA: Diagnosis not present

## 2017-02-06 DIAGNOSIS — L97512 Non-pressure chronic ulcer of other part of right foot with fat layer exposed: Secondary | ICD-10-CM | POA: Diagnosis not present

## 2017-02-06 DIAGNOSIS — I110 Hypertensive heart disease with heart failure: Secondary | ICD-10-CM | POA: Diagnosis not present

## 2017-02-06 DIAGNOSIS — E11622 Type 2 diabetes mellitus with other skin ulcer: Secondary | ICD-10-CM | POA: Diagnosis not present

## 2017-02-06 DIAGNOSIS — J45909 Unspecified asthma, uncomplicated: Secondary | ICD-10-CM | POA: Diagnosis not present

## 2017-02-06 DIAGNOSIS — L97222 Non-pressure chronic ulcer of left calf with fat layer exposed: Secondary | ICD-10-CM | POA: Diagnosis not present

## 2017-02-06 DIAGNOSIS — I429 Cardiomyopathy, unspecified: Secondary | ICD-10-CM | POA: Diagnosis not present

## 2017-02-06 DIAGNOSIS — L97522 Non-pressure chronic ulcer of other part of left foot with fat layer exposed: Secondary | ICD-10-CM | POA: Diagnosis not present

## 2017-02-06 DIAGNOSIS — E11621 Type 2 diabetes mellitus with foot ulcer: Secondary | ICD-10-CM | POA: Diagnosis not present

## 2017-02-06 DIAGNOSIS — I2510 Atherosclerotic heart disease of native coronary artery without angina pectoris: Secondary | ICD-10-CM | POA: Diagnosis not present

## 2017-02-06 DIAGNOSIS — Z7951 Long term (current) use of inhaled steroids: Secondary | ICD-10-CM | POA: Diagnosis not present

## 2017-02-06 DIAGNOSIS — L97821 Non-pressure chronic ulcer of other part of left lower leg limited to breakdown of skin: Secondary | ICD-10-CM | POA: Diagnosis not present

## 2017-02-06 DIAGNOSIS — M1990 Unspecified osteoarthritis, unspecified site: Secondary | ICD-10-CM | POA: Diagnosis not present

## 2017-02-06 DIAGNOSIS — Z79891 Long term (current) use of opiate analgesic: Secondary | ICD-10-CM | POA: Diagnosis not present

## 2017-02-06 DIAGNOSIS — Z4800 Encounter for change or removal of nonsurgical wound dressing: Secondary | ICD-10-CM | POA: Diagnosis not present

## 2017-02-06 DIAGNOSIS — Z7984 Long term (current) use of oral hypoglycemic drugs: Secondary | ICD-10-CM | POA: Diagnosis not present

## 2017-02-06 DIAGNOSIS — I272 Pulmonary hypertension, unspecified: Secondary | ICD-10-CM | POA: Diagnosis not present

## 2017-02-06 DIAGNOSIS — I509 Heart failure, unspecified: Secondary | ICD-10-CM | POA: Diagnosis not present

## 2017-02-09 ENCOUNTER — Encounter: Payer: Medicare Other | Admitting: Physician Assistant

## 2017-02-09 DIAGNOSIS — I272 Pulmonary hypertension, unspecified: Secondary | ICD-10-CM | POA: Diagnosis not present

## 2017-02-09 DIAGNOSIS — E785 Hyperlipidemia, unspecified: Secondary | ICD-10-CM | POA: Diagnosis not present

## 2017-02-09 DIAGNOSIS — E11622 Type 2 diabetes mellitus with other skin ulcer: Secondary | ICD-10-CM | POA: Diagnosis not present

## 2017-02-09 DIAGNOSIS — I509 Heart failure, unspecified: Secondary | ICD-10-CM | POA: Diagnosis not present

## 2017-02-09 DIAGNOSIS — E11621 Type 2 diabetes mellitus with foot ulcer: Secondary | ICD-10-CM | POA: Diagnosis not present

## 2017-02-09 DIAGNOSIS — Z7982 Long term (current) use of aspirin: Secondary | ICD-10-CM | POA: Diagnosis not present

## 2017-02-09 DIAGNOSIS — I110 Hypertensive heart disease with heart failure: Secondary | ICD-10-CM | POA: Diagnosis not present

## 2017-02-09 DIAGNOSIS — I2510 Atherosclerotic heart disease of native coronary artery without angina pectoris: Secondary | ICD-10-CM | POA: Diagnosis not present

## 2017-02-09 DIAGNOSIS — L97212 Non-pressure chronic ulcer of right calf with fat layer exposed: Secondary | ICD-10-CM | POA: Diagnosis not present

## 2017-02-09 DIAGNOSIS — Z79899 Other long term (current) drug therapy: Secondary | ICD-10-CM | POA: Diagnosis not present

## 2017-02-09 DIAGNOSIS — I890 Lymphedema, not elsewhere classified: Secondary | ICD-10-CM | POA: Diagnosis not present

## 2017-02-09 DIAGNOSIS — I429 Cardiomyopathy, unspecified: Secondary | ICD-10-CM | POA: Diagnosis not present

## 2017-02-09 DIAGNOSIS — Z7984 Long term (current) use of oral hypoglycemic drugs: Secondary | ICD-10-CM | POA: Diagnosis not present

## 2017-02-09 DIAGNOSIS — L97222 Non-pressure chronic ulcer of left calf with fat layer exposed: Secondary | ICD-10-CM | POA: Diagnosis not present

## 2017-02-09 DIAGNOSIS — J45909 Unspecified asthma, uncomplicated: Secondary | ICD-10-CM | POA: Diagnosis not present

## 2017-02-12 DIAGNOSIS — I110 Hypertensive heart disease with heart failure: Secondary | ICD-10-CM | POA: Diagnosis not present

## 2017-02-12 DIAGNOSIS — J45909 Unspecified asthma, uncomplicated: Secondary | ICD-10-CM | POA: Diagnosis not present

## 2017-02-12 DIAGNOSIS — Z79891 Long term (current) use of opiate analgesic: Secondary | ICD-10-CM | POA: Diagnosis not present

## 2017-02-12 DIAGNOSIS — L97821 Non-pressure chronic ulcer of other part of left lower leg limited to breakdown of skin: Secondary | ICD-10-CM | POA: Diagnosis not present

## 2017-02-12 DIAGNOSIS — M1990 Unspecified osteoarthritis, unspecified site: Secondary | ICD-10-CM | POA: Diagnosis not present

## 2017-02-12 DIAGNOSIS — Z7984 Long term (current) use of oral hypoglycemic drugs: Secondary | ICD-10-CM | POA: Diagnosis not present

## 2017-02-12 DIAGNOSIS — I429 Cardiomyopathy, unspecified: Secondary | ICD-10-CM | POA: Diagnosis not present

## 2017-02-12 DIAGNOSIS — Z7951 Long term (current) use of inhaled steroids: Secondary | ICD-10-CM | POA: Diagnosis not present

## 2017-02-12 DIAGNOSIS — E11622 Type 2 diabetes mellitus with other skin ulcer: Secondary | ICD-10-CM | POA: Diagnosis not present

## 2017-02-12 DIAGNOSIS — I272 Pulmonary hypertension, unspecified: Secondary | ICD-10-CM | POA: Diagnosis not present

## 2017-02-12 DIAGNOSIS — I509 Heart failure, unspecified: Secondary | ICD-10-CM | POA: Diagnosis not present

## 2017-02-12 DIAGNOSIS — Z4800 Encounter for change or removal of nonsurgical wound dressing: Secondary | ICD-10-CM | POA: Diagnosis not present

## 2017-02-12 DIAGNOSIS — L97222 Non-pressure chronic ulcer of left calf with fat layer exposed: Secondary | ICD-10-CM | POA: Diagnosis not present

## 2017-02-12 DIAGNOSIS — I2510 Atherosclerotic heart disease of native coronary artery without angina pectoris: Secondary | ICD-10-CM | POA: Diagnosis not present

## 2017-02-12 NOTE — Progress Notes (Signed)
Jillian, Watson ([id number]) Visit Report for 02/09/2017 Arrival Information Details Patient Name: [name], [name]. Date of Service: 02/09/2017 10:15 AM Medical Record Number: [id number] Patient Account Number: [id number] Date of Birth/Sex: [birth year] (72 y.o. Female) Treating RN: Montey Hora Primary Care Kaoru Rezendes: Lelon Huh Other Clinician: Referring Zyiere Rosemond: Lelon Huh Treating Vance Hochmuth/Extender: Melburn Hake, HOYT Weeks in Treatment: 18 Visit Information History Since Last Visit Added or deleted any medications: No Patient Arrived: Wheel Chair Any new allergies or adverse reactions: No Arrival Time: 10:32 Had a fall or experienced change in No activities of daily living that may affect Accompanied By: spouse risk of falls: Transfer Assistance: None Signs or symptoms of abuse/neglect since last visito No Patient Identification Verified: Yes Hospitalized since last visit: No Secondary Verification Process Completed: Yes Has Dressing in Place as Prescribed: Yes Patient Requires Transmission-Based No Has Compression in Place as Prescribed: Yes Precautions: Pain Present Now: No Patient Has Alerts: Yes Electronic Signature(s) Signed: 02/09/2017 5:15:44 PM By: Montey Hora Entered By: Montey Hora on 02/09/2017 10:33:26 [name] ([id number]) -------------------------------------------------------------------------------- Compression Therapy Details Patient Name: Jillian Albe B. Date of Service: 02/09/2017 10:15 AM Medical Record Number: [id number] Patient Account Number: [id number] Date of Birth/Sex: 1945/11/30 (72 y.o. Female) Treating RN: Montey Hora Primary Care Hiawatha Merriott: Lelon Huh Other Clinician: Referring Stasia Somero: Lelon Huh Treating Dilcia Rybarczyk/Extender: Melburn Hake, HOYT Weeks in Treatment: 18 Compression Therapy Performed for Wound Assessment: Wound #1 Left,Lateral Lower Leg Performed By: Clinician Montey Hora, RN Compression Type: Three  Layer Post Procedure Diagnosis Same as Pre-procedure Electronic Signature(s) Signed: 02/09/2017 11:36:15 AM By: Montey Hora Entered By: Montey Hora on 02/09/2017 11:36:15 [name] ([id number]) -------------------------------------------------------------------------------- Compression Therapy Details Patient Name: Jillian, [name] Date of Service: 02/09/2017 10:15 AM Medical Record Number: [id number] Patient Account Number: [id number] Date of Birth/Sex: [birth year] (72 y.o. Female) Treating RN: Montey Hora Primary Care Cicero Noy: Lelon Huh Other Clinician: Referring Massai Hankerson: Lelon Huh Treating Daren Yeagle/Extender: Melburn Hake, HOYT Weeks in Treatment: 18 Compression Therapy Performed for Wound Assessment: Wound #11 Left,Medial Lower Leg Performed By: Clinician Montey Hora, RN Compression Type: Three Layer Post Procedure Diagnosis Same as Pre-procedure Electronic Signature(s) Signed: 02/09/2017 11:36:16 AM By: Montey Hora Entered By: Montey Hora on 02/09/2017 11:36:15 [name] ([id number]) -------------------------------------------------------------------------------- Encounter Discharge Information Details Patient Name: Jillian, [name] Date of Service: 02/09/2017 10:15 AM Medical Record Number: [id number] Patient Account Number: [id number] Date of Birth/Sex: March 05, 1945 (72 y.o. Female) Treating RN: Montey Hora Primary Care Mel Langan: Lelon Huh Other Clinician: Referring Bernadean Saling: Lelon Huh Treating Rashaun Curl/Extender: Melburn Hake, HOYT Weeks in Treatment: 66 Encounter Discharge Information Items Discharge Pain Level: 0 Discharge Condition: Stable Ambulatory Status: Wheelchair Discharge Destination: Home Private Transportation: Auto Accompanied By: spouse Schedule Follow-up Appointment: Yes Medication Reconciliation completed and provided No to Patient/Care Tip Atienza: Clinical Summary of Care: Provided Form Type Recipient Paper  Patient cs Electronic Signature(s) Signed: 02/09/2017 11:45:19 AM By: Montey Hora Previous Signature: 02/09/2017 11:36:40 AM Version By: Lorine Bears RCP, RRT, CHT Entered By: Montey Hora on 02/09/2017 11:45:18 [name] ([id number]) -------------------------------------------------------------------------------- Lower Extremity Assessment Details Patient Name: Jillian Albe B. Date of Service: 02/09/2017 10:15 AM Medical Record Number: [id number] Patient Account Number: [id number] Date of Birth/Sex: Oct 27, 1945 (72 y.o. Female) Treating RN: Montey Hora Primary Care Yassine Brunsman: Lelon Huh Other Clinician: Referring Bryndle Corredor: Lelon Huh Treating Raford Brissett/Extender: Melburn Hake, HOYT Weeks in Treatment: 18 Edema Assessment Assessed: [Left: No] [Right: No] [Left: Edema] [Right: :] Calf Left: Right: Point of Measurement: 30 cm From Medial Instep 37.9 cm cm  Ankle Left: Right: Point of Measurement: 10 cm From Medial Instep 22.3 cm cm Vascular Assessment Pulses: Dorsalis Pedis Palpable: [Left:Yes] Posterior Tibial Extremity colors, hair growth, and conditions: Extremity Color: [Left:Hyperpigmented] Hair Growth on Extremity: [Left:No] Temperature of Extremity: [Left:Warm] Capillary Refill: [Left:< 3 seconds] Toe Nail Assessment Left: Right: Thick: Yes Discolored: Yes Deformed: Yes Improper Length and Hygiene: No Electronic Signature(s) Signed: 02/09/2017 5:15:44 PM By: Montey Hora Entered By: Montey Hora on 02/09/2017 10:47:45 [name] ([id number]) -------------------------------------------------------------------------------- Multi Wound Chart Details Patient Name: Jillian Albe B. Date of Service: 02/09/2017 10:15 AM Medical Record Number: [id number] Patient Account Number: [id number] Date of Birth/Sex: [birth year] (72 y.o. 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 Comorbid History: Asthma, Congestive Heart Asthma, Congestive Heart N/A Failure, Coronary Artery Failure, Coronary Artery Disease, Hypertension, Type II Disease, Hypertension, Type II Diabetes, Osteoarthritis Diabetes, Osteoarthritis Date Acquired: 08/02/2016 01/26/2017 N/A Weeks of Treatment: 18 2 N/A Wound Status: Open Open N/A Clustered Wound: No Yes N/A Measurements L x W x D 2.3x2.3x0.1 0.5x1.4x0.1 N/A (cm) Area (cm) : 4.155 0.55 N/A Volume (cm) : 0.415 0.055 N/A % Reduction in Area: 76.00% 96.30% N/A % Reduction in Volume: 76.00% 96.30% N/A Classification: Grade 2 Grade 2 N/A Exudate Amount: Large Large N/A Exudate Type: Serous Serous N/A Exudate Color: amber amber N/A Wound Margin: Flat and Intact Flat and Intact N/A Granulation Amount: Large (67-100%) Large (67-100%) N/A Granulation Quality: Red Pink N/A Necrotic Amount: Small (1-33%) Small (1-33%) N/A Necrotic Tissue: Eschar Adherent Slough N/A Exposed Structures: Fat Layer (Subcutaneous Fascia: No N/A Tissue) Exposed: Yes Fat Layer (Subcutaneous NYIMAH, SHADDUCK B. ([id number]) Fascia: No Tissue) Exposed: No Tendon: No Tendon: No Muscle: No Muscle: No Joint: No Joint: No Bone: No Bone: No Epithelialization: None Medium (34-66%) N/A Periwound Skin Texture: Excoriation: No Excoriation: No N/A Induration: No Induration: No Callus: No Callus: No Crepitus: No Crepitus: No Rash: No Rash: No Scarring:  No Scarring: No Periwound Skin Moisture: Maceration: No Maceration: No N/A Dry/Scaly: No Dry/Scaly: No Periwound Skin Color: Atrophie Blanche: No Atrophie Blanche: No N/A Cyanosis: No Cyanosis: No Ecchymosis: No Ecchymosis: No Erythema: No Erythema: No Hemosiderin Staining: No Hemosiderin Staining: No Mottled: No Mottled: No Pallor: No Pallor: No Rubor: No Rubor: No Temperature: No Abnormality No Abnormality N/A Tenderness on Palpation: Yes Yes N/A Wound Preparation: Ulcer Cleansing: Other: soap Ulcer Cleansing: Other: soap N/A and water and water Topical Anesthetic Applied: Topical Anesthetic Applied: Other: lidocaine 4% Other: lidocaine 4% Treatment Notes Electronic Signature(s) Signed: 02/09/2017 5:15:44 PM By: Montey Hora Entered By: Montey Hora on 02/09/2017 11:07:55 [name] ([id number]) -------------------------------------------------------------------------------- [place] Details Patient Name: COBIE, LEIDNER B. Date of Service: 02/09/2017 10:15 AM Medical Record Number: [id number] Patient Account Number: [id number] Date of Birth/Sex: 05-Jan-1945 (72 y.o. Female) Treating RN: Montey Hora Primary Care Arther Heisler: Lelon Huh Other Clinician: Referring Rose Hegner: Lelon Huh Treating Brantley Wiley/Extender: Melburn Hake, HOYT Weeks in Treatment: 57 Active Inactive ` Abuse / Safety / Falls / Self Care Management Nursing Diagnoses: History of Falls Potential for falls Goals: Patient will not  experience any injury related to falls Date Initiated: 10/02/2016 Target Resolution Date: 01/06/2017 Goal Status: Active Interventions: Assess: immobility, friction, shearing, incontinence upon admission and as needed Assess impairment of mobility on admission and as needed per policy Assess personal safety and home safety (as indicated) on admission and as needed Notes: ` Nutrition Nursing Diagnoses: Imbalanced nutrition Impaired glucose  control: actual or potential Potential for alteratiion in Nutrition/Potential for imbalanced nutrition Goals: Patient/caregiver agrees to and verbalizes understanding of need to use nutritional supplements and/or vitamins as prescribed Date Initiated: 10/02/2016 Target Resolution Date: 02/10/2017 Goal Status: Active Patient/caregiver will maintain therapeutic glucose control Date Initiated: 10/02/2016 Target Resolution Date: 01/06/2017 Goal Status: Active Interventions: Assess patient nutrition upon admission and as needed per policy Provide education on elevated blood sugars and impact on wound healing Provide education on nutrition Treatment Activities: Education provided on Nutrition : 10/02/2016 ZARIE, KOSIBA ([id number]) Notes: ` Orientation to the Wound Care Program Nursing Diagnoses: Knowledge deficit related to the wound healing center program Goals: Patient/caregiver will verbalize understanding of the [place] Date Initiated: 10/02/2016 Target Resolution Date: 11/11/2016 Goal Status: Active Interventions: Provide education on orientation to the wound center Notes: ` Pain, Acute or Chronic Nursing Diagnoses: Pain, acute or chronic: actual or potential Potential alteration in comfort, pain Goals: Patient/caregiver will verbalize adequate pain control between visits Date Initiated: 10/02/2016 Target Resolution Date: 02/10/2017 Goal Status: Active Interventions: Complete pain assessment as per visit requirements Notes: ` Wound/Skin Impairment Nursing Diagnoses: Impaired tissue integrity Knowledge deficit related to ulceration/compromised skin integrity Goals: Ulcer/skin breakdown will have a volume reduction of 80% by week 12 Date Initiated: 10/02/2016 Target Resolution Date: 02/03/2017 Goal Status: Active Interventions: Assess patient/caregiver ability to perform ulcer/skin care regimen upon admission and as needed Assess ulceration(s) every  visit Notes: DONELL, SLIWINSKI ([id number]) Electronic Signature(s) Signed: 02/09/2017 5:15:44 PM By: Montey Hora Entered By: Montey Hora on 02/09/2017 11:07:34 [name] ([id number]) -------------------------------------------------------------------------------- Pain Assessment Details Patient Name: Jillian Albe B. Date of Service: 02/09/2017 10:15 AM Medical Record Number: [id number] Patient Account Number: [id number] Date of Birth/Sex: [birth year] (72 y.o. Female) Treating RN: Montey Hora Primary Care Anquan Azzarello: Lelon Huh Other Clinician: Referring Jacody Beneke: Lelon Huh Treating Abdulloh Ullom/Extender: Melburn Hake, HOYT Weeks in Treatment: 74 Active Problems Location of Pain Severity and Description of Pain Patient Has Paino Yes Site Locations Pain Location: Generalized Pain Pain Management and Medication Current Pain Management: Notes Topical or injectable lidocaine is offered to patient for acute pain when surgical debridement is performed. If needed, Patient is instructed to use over the counter pain medication for the following 24-48 hours after debridement. Wound care MDs do not prescribed pain medications. Patient has chronic pain or uncontrolled pain. Patient has been instructed to make an appointment with their Primary Care Physician for pain management. Electronic Signature(s) Signed: 02/09/2017 5:15:44 PM By: Montey Hora Entered By: Montey Hora on 02/09/2017 10:33:47 [name] ([id number]) -------------------------------------------------------------------------------- Patient/Caregiver Education Details Patient Name: [name] Date of Service: 02/09/2017 10:15 AM Medical Record Number: [id number] Patient Account Number: [id number] Date of Birth/Gender: 1945-02-06 (72 y.o. Female) Treating RN: Montey Hora Primary Care Physician: Lelon Huh Other Clinician: Referring Physician: Lelon Huh Treating Physician/Extender: Sharalyn Ink in Treatment: 76 Education Assessment Education Provided To: Patient and Caregiver Education Topics Provided Venous: Handouts: Other: leg elevation and continued compression on RLE Methods: Explain/Verbal Responses: State content correctly Electronic Signature(s) Signed: 02/09/2017 5:15:44 PM By: Montey Hora Entered By: Montey Hora on 02/09/2017 11:45:46 [place],  LYNZIE CLIBURN ([id number]) -------------------------------------------------------------------------------- Wound Assessment Details Patient Name: ADIN, LARICCIA. Date of Service: 02/09/2017 10:15 AM Medical Record Number: [id number] Patient Account Number: [id number] Date of Birth/Sex: [birth year] (72 y.o. Female) Treating RN: Montey Hora Primary Care Ithiel Liebler: Lelon Huh Other Clinician: Referring Adessa Primiano: Lelon Huh Treating Mischa Brittingham/Extender: Melburn Hake, HOYT Weeks in Treatment: 18 Wound Status Wound Number: 1 Primary Diabetic Wound/Ulcer of the Lower Extremity Etiology: Wound Location: Left Lower Leg - Lateral Wound Open Wounding Event: Gradually Appeared Status: Date Acquired: 08/02/2016 Comorbid Asthma, Congestive Heart Failure, Coronary Weeks Of Treatment: 18 History: Artery Disease, Hypertension, Type II Diabetes, Clustered Wound: No Osteoarthritis Photos Photo Uploaded By: Montey Hora on 02/09/2017 11:00:05 Wound Measurements Length: (cm) 2.3 Width: (cm) 2.3 Depth: (cm) 0.1 Area: (cm) 4.155 Volume: (cm) 0.415 % Reduction in Area: 76% % Reduction in Volume: 76% Epithelialization: None Tunneling: No Undermining: No Wound Description Classification: Grade 2 Wound Margin: Flat and Intact Exudate Amount: Large Exudate Type: Serous Exudate Color: amber Foul Odor After Cleansing: No Slough/Fibrino No Wound Bed Granulation Amount: Large (67-100%) Exposed Structure Granulation Quality: Red Fascia Exposed: No Necrotic Amount: Small (1-33%) Fat Layer (Subcutaneous Tissue)  Exposed: Yes Necrotic Quality: Eschar Tendon Exposed: No Muscle Exposed: No Joint Exposed: No Bone Exposed: No Periwound Skin Texture Beever, Doninique B. ([id number]) Texture Color No Abnormalities Noted: No No Abnormalities Noted: No Callus: No Atrophie Blanche: No Crepitus: No Cyanosis: No Excoriation: No Ecchymosis: No Induration: No Erythema: No Rash: No Hemosiderin Staining: No Scarring: No Mottled: No Pallor: No Moisture Rubor: No No Abnormalities Noted: No Dry / Scaly: No Temperature / Pain Maceration: No Temperature: No Abnormality Tenderness on Palpation: Yes Wound Preparation Ulcer Cleansing: Other: soap and water, Topical Anesthetic Applied: Other: lidocaine 4%, Treatment Notes Wound #1 (Left, Lateral Lower Leg) 1. Cleansed with: Cleanse wound with antibacterial soap and water 2. Anesthetic Topical Lidocaine 4% cream to wound bed prior to debridement 3. Peri-wound Care: Moisturizing lotion 4. Dressing Applied: Hydrafera Blue 5. Secondary Dressing Applied ABD Pad 7. Secured with 3 Layer Compression System - Left Lower Extremity Electronic Signature(s) Signed: 02/09/2017 5:15:44 PM By: Montey Hora Entered By: Montey Hora on 02/09/2017 10:46:43 [name] ([id number]) -------------------------------------------------------------------------------- Wound Assessment Details Patient Name: JIMESHA, RISING B. Date of Service: 02/09/2017 10:15 AM Medical Record Number: [id number] Patient Account Number: [id number] Date of Birth/Sex: [birth year] (72 y.o. Female) Treating RN: Montey Hora Primary Care Darica Goren: Lelon Huh Other Clinician: Referring Cheyeanne Roadcap: Lelon Huh Treating Hodaya Curto/Extender: Melburn Hake, HOYT Weeks in Treatment: 18 Wound Status Wound Number: 11 Primary Diabetic Wound/Ulcer of the Lower Extremity Etiology: Wound Location: Left Lower Leg - Medial Wound Open Wounding Event: Gradually Appeared Status: Date Acquired:  01/26/2017 Comorbid Asthma, Congestive Heart Failure, Coronary Weeks Of Treatment: 2 History: Artery Disease, Hypertension, Type II Diabetes, Clustered Wound: Yes Osteoarthritis Photos Photo Uploaded By: Montey Hora on 02/09/2017 11:00:16 Wound Measurements Length: (cm) 0.5 Width: (cm) 1.4 Depth: (cm) 0.1 Area: (cm) 0.55 Volume: (cm) 0.055 % Reduction in Area: 96.3% % Reduction in Volume: 96.3% Epithelialization: Medium (34-66%) Tunneling: No Undermining: No Wound Description Classification: Grade 2 Wound Margin: Flat and Intact Exudate Amount: Large Exudate Type: Serous Exudate Color: amber Foul Odor After Cleansing: No Slough/Fibrino Yes Wound Bed Granulation Amount: Large (67-100%) Exposed Structure Granulation Quality: Pink Fascia Exposed: No Necrotic Amount: Small (1-33%) Fat Layer (Subcutaneous Tissue) Exposed: No Necrotic Quality: Adherent Slough Tendon Exposed: No Muscle Exposed: No Joint Exposed: No Bone Exposed: No Periwound Skin Texture Utley, Karem B. ([id number]) Texture Color No  Abnormalities Noted: No No Abnormalities Noted: No Callus: No Atrophie Blanche: No Crepitus: No Cyanosis: No Excoriation: No Ecchymosis: No Induration: No Erythema: No Rash: No Hemosiderin Staining: No Scarring: No Mottled: No Pallor: No Moisture Rubor: No No Abnormalities Noted: No Dry / Scaly: No Temperature / Pain Maceration: No Temperature: No Abnormality Tenderness on Palpation: Yes Wound Preparation Ulcer Cleansing: Other: soap and water, Topical Anesthetic Applied: Other: lidocaine 4%, Treatment Notes Wound #11 (Left, Medial Lower Leg) 1. Cleansed with: Cleanse wound with antibacterial soap and water 2. Anesthetic Topical Lidocaine 4% cream to wound bed prior to debridement 3. Peri-wound Care: Moisturizing lotion 4. Dressing Applied: Hydrafera Blue 5. Secondary Dressing Applied ABD Pad 7. Secured with 3 Layer Compression System - Left  Lower Extremity Electronic Signature(s) Signed: 02/09/2017 5:15:44 PM By: Montey Hora Entered By: Montey Hora on 02/09/2017 10:47:16 [name] ([id number]) -------------------------------------------------------------------------------- [place] Details Patient Name: Jillian Albe B. Date of Service: 02/09/2017 10:15 AM Medical Record Number: [id number] Patient Account Number: [id number] Date of Birth/Sex: 02-06-1945 (72 y.o. Female) Treating RN: Montey Hora Primary Care Zephaniah Enyeart: Lelon Huh Other Clinician: Referring Bessye Stith: Lelon Huh Treating Jacquetta Polhamus/Extender: Melburn Hake, HOYT Weeks in Treatment: 18 Vital Signs Time Taken: 10:33 Temperature (F): 97.8 Height (in): 64 Pulse (bpm): 57 Weight (lbs): 209 Respiratory Rate (breaths/min): 18 Body Mass Index (BMI): 35.9 Blood Pressure (mmHg): 142/65 Reference Range: 80 - 120 mg / dl Electronic Signature(s) Signed: 02/09/2017 5:15:44 PM By: Montey Hora Entered By: Montey Hora on 02/09/2017 10:34:05

## 2017-02-13 DIAGNOSIS — I429 Cardiomyopathy, unspecified: Secondary | ICD-10-CM

## 2017-02-13 DIAGNOSIS — L97821 Non-pressure chronic ulcer of other part of left lower leg limited to breakdown of skin: Secondary | ICD-10-CM | POA: Diagnosis not present

## 2017-02-13 DIAGNOSIS — M1990 Unspecified osteoarthritis, unspecified site: Secondary | ICD-10-CM

## 2017-02-13 DIAGNOSIS — J45909 Unspecified asthma, uncomplicated: Secondary | ICD-10-CM

## 2017-02-13 DIAGNOSIS — I509 Heart failure, unspecified: Secondary | ICD-10-CM

## 2017-02-13 DIAGNOSIS — I272 Pulmonary hypertension, unspecified: Secondary | ICD-10-CM

## 2017-02-13 DIAGNOSIS — L97222 Non-pressure chronic ulcer of left calf with fat layer exposed: Secondary | ICD-10-CM | POA: Diagnosis not present

## 2017-02-13 DIAGNOSIS — E11622 Type 2 diabetes mellitus with other skin ulcer: Secondary | ICD-10-CM | POA: Diagnosis not present

## 2017-02-13 DIAGNOSIS — I110 Hypertensive heart disease with heart failure: Secondary | ICD-10-CM | POA: Diagnosis not present

## 2017-02-13 DIAGNOSIS — I2510 Atherosclerotic heart disease of native coronary artery without angina pectoris: Secondary | ICD-10-CM | POA: Diagnosis not present

## 2017-02-13 NOTE — Progress Notes (Signed)
RAIYAH, SPEAKMAN ([id number]) Visit Report for 02/09/2017 Chief Complaint Document Details Patient Name: [name], VEAZIE. Date of Service: 02/09/2017 10:15 AM Medical Record Number: [id number] Patient Account Number: [id number] Date of Birth/Sex: Nov 06, 1945 (72 y.o. Female) Treating RN: Roger Shelter Primary Care Provider: Lelon Huh Other Clinician: Referring Provider: Lelon Huh Treating Provider/Extender: Melburn Hake, Japleen Tornow Weeks in Treatment: 54 Information Obtained from: Patient Chief Complaint Patient seen for complaints of Non-Healing Wounds to both lower extremities Electronic Signature(s) Signed: 02/12/2017 9:06:28 AM By: Worthy Keeler PA-C Entered By: Worthy Keeler on 02/09/2017 10:33:04 [name] ([id number]) -------------------------------------------------------------------------------- HPI Details Patient Name: [name]. Date of Service: 02/09/2017 10:15 AM Medical Record Number: [id number] Patient Account Number: [id number] Date of Birth/Sex: 06-01-1945 (72 y.o. Female) Treating RN: Montey Hora Primary Care Provider: Lelon Huh Other Clinician: Referring Provider: Lelon Huh Treating Provider/Extender: Melburn Hake, Amirra Herling Weeks in Treatment: 18 History of Present Illness Location: both lower extremity swelling with ulceration HPI Description: 72 year old patient who sees her PCP Dr. Lelon Huh was recently evaluated 10 days ago for diabetes mellitus, hypertension, CHF and hyperlipidemia. she also was noted to have ulcerations develop in her legs and she has been applying Silvadene dressings locally. In the past she has refused wound care referrals.her cardiologist Dr. Saunders Revel saw her and put her on 40 mg of furosemide daily. last hemoglobin A1c was 7.7%. she was also placed on ciprofloxacin twice daily for 7 days and a urine culture was recommended. past medical history significant for coronary artery disease, diabetes mellitus, nonischemic  cardiomyopathy and pulmonary hypertension. She is also status post heart catheterization and coronary angiography, tubal ligation and breast cyst removal in the past. She has never been a smoker. Patient had arterial studies done which showed bilateral ABIs are artificially elevated due to noncompressible and calcified vessels. Triphasic waveform throughout. Right great toe TBI is elevated while the left is normal. 10/23/2016 -- the patient is rather moribund from several issues including chronic back pain and knee pain and swelling of her legs. The large necrotic area on her left lateral anterior calf was bleeding on touch after washing her leg. There was a spot which needed silver nitrate cauterization and this was done appropriately. 11/06/2016 -- she is again noted to have friable bleeding from the left lower extremity wounds and this again had to be cauterized with silver nitrate to control the bleeding as pressure itself would not do it. 11/20/2016 -- the right lower extremity is completely healed and we have ordered 30-40 mm compression stocking's in both the dural layer and also a pair of juxta lites. 12/18/2016 -- she has not been wearing her compression stockings on her right leg and these have opened out into ulcerations again. Her left lower extremity has circumferential ulcerations now. I believe at this stage I would like to get her venous reflux studies done to make certain that there is no fixable superficial venous reflux. 12/29/2016 -- she has made an excellent recovery having continued with her appropriate doses of diuretics and elevation and exercise. She has not yet received her right lower extremity juxta lites. Her venous duplex study is at the end of January 01/13/16 on evaluation today patient's wounds appeared to be overall doing much better much less hyper granular than previous week's evaluation. The Hydrofera Blue Dressing's to be doing very well. She does tell me that  she is having a little bit more discomfort in the posterior aspect of her leg where she has a wound  at this point. Fortunately there appears to be no infection. 01/19/17 on evaluation today patient appears to be doing very well in regard to her bilateral lower extremity swelling and at this point in time her left lower extremity ulcers. Her wounds appear to be doing much better. She has been tolerating the dressing's at this point fortunately she did get the Juxta-Lite compression as of today as well. Nonetheless I am pleased that she has been tolerating everything so well and that her wounds looks so good. In fact she is an excellent granular surface no evidence of slough covering and I do not see any reason for likely debridement today. Especially in regard to the posterior leg. 01/26/17 on evaluation today patient appears to be doing decently well in regard to her wounds. The surface of the majority of her wounds is greatly improved. She has been using the Juxta-Lite compression which is excellent. Nonetheless the wound of the left posterior calf did require some debridement today otherwise the majority of the wounds did not require any debridement. The this was due to slough buildup on the surface. She also has a small skin flap where two of the ulcers actually connected and this has loosened up I'm concerned that things may not heal well with that flap but for the time being I'm not gonna do anything different in that regard. BRIONA, KORPELA ([id number]) 02/09/17 on evaluation patient presents regarding her lower extremity ulcers. Fortunately these both appear to be doing excellent on evaluation today. She has been tolerating the dressing changes without complication and overall the wound bed appears to be extremely healthy no slough noted no need for debridement. Patient likewise is very pleased with her progress. Electronic Signature(s) Signed: 02/12/2017 9:06:28 AM By: Worthy Keeler  PA-C Entered By: Worthy Keeler on 02/09/2017 17:58:05 [name] ([id number]) -------------------------------------------------------------------------------- Physical Exam Details Patient Name: [name], Jillian B. Date of Service: 02/09/2017 10:15 AM Medical Record Number: [id number] Patient Account Number: [id number] Date of Birth/Sex: 18-Sep-1945 (72 y.o. Female) Treating RN: Montey Hora Primary Care Provider: Lelon Huh Other Clinician: Referring Provider: Lelon Huh Treating Provider/Extender: Melburn Hake, Agnes Brightbill Weeks in Treatment: 18 Constitutional Obese and well-hydrated in no acute distress. Respiratory normal breathing without difficulty. clear to auscultation bilaterally. Cardiovascular regular rate and rhythm with normal S1, S2. Psychiatric this patient is able to make decisions and demonstrates good insight into disease process. Alert and Oriented x 3. pleasant and cooperative. Notes Patient's wound bed appears to have an excellent granular surface and there does not appear to be any evidence of infection. Overall I'm extremely pleased with the progress she's made and we are going to continue with the Current wound care measures. Electronic Signature(s) Signed: 02/12/2017 9:06:28 AM By: Worthy Keeler PA-C Entered By: Worthy Keeler on 02/09/2017 17:58:56 [name] ([id number]) -------------------------------------------------------------------------------- Physician Orders Details Patient Name: [name] Date of Service: 02/09/2017 10:15 AM Medical Record Number: [id number] Patient Account Number: [id number] Date of Birth/Sex: 1945/10/09 (72 y.o. Female) Treating RN: Montey Hora Primary Care Provider: Lelon Huh Other Clinician: Referring Provider: Lelon Huh Treating Provider/Extender: Melburn Hake, Carnelius Hammitt Weeks in Treatment: 57 Verbal / Phone Orders: No Diagnosis Coding ICD-10 Coding Code Description E11.622 Type 2 diabetes mellitus with  other skin ulcer I89.0 Lymphedema, not elsewhere classified L97.212 Non-pressure chronic ulcer of right calf with fat layer exposed L97.222 Non-pressure chronic ulcer of left calf with fat layer exposed Wound Cleansing Wound #1 Left,Lateral Lower Leg o Clean wound with Normal  Saline. o Cleanse wound with mild soap and water o May Shower, gently pat wound dry prior to applying new dressing. Wound #11 Left,Medial Lower Leg o Clean wound with Normal Saline. o Cleanse wound with mild soap and water o May Shower, gently pat wound dry prior to applying new dressing. Anesthetic (add to Medication List) Wound #1 Left,Lateral Lower Leg o Topical Lidocaine 4% cream applied to wound bed prior to debridement (In Clinic Only). Wound #11 Left,Medial Lower Leg o Topical Lidocaine 4% cream applied to wound bed prior to debridement (In Clinic Only). Primary Wound Dressing Wound #1 Left,Lateral Lower Leg o Hydrafera Blue - Please make sure this is Hydrafera Blue Ready Transfer Wound #11 Left,Medial Lower Leg o Hydrafera Blue - Please make sure this is Materials engineer Secondary Dressing Wound #1 Left,Lateral Lower Leg o ABD pad Wound #11 Left,Medial Lower Leg o ABD pad Dressing Change Frequency Wound #1 Left,Lateral Lower Leg DESSA, LEDEE ([id number]) o Change Dressing Monday, Wednesday, Friday - HHRN to change Monday, Wednesday and Fridays and pt to come to [facility] on Fridays. Wound #11 Left,Medial Lower Leg o Change Dressing Monday, Wednesday, Friday - HHRN to change Monday, Wednesday and Fridays and pt to come to [place] on Fridays. Follow-up Appointments Wound #1 Left,Lateral Lower Leg o Return Appointment in 1 week. Wound #11 Left,Medial Lower Leg o Return Appointment in 1 week. Edema Control Wound #1 Left,Lateral Lower Leg o 3 Layer Compression System - Left Lower Extremity - ****Make sure you are only putting a  3-layer wrap on the left leg**** wrap up to 3 finger widths below the knee. o Patient to wear own Juxtalite/Juzo compression garment. - Please educate patient and spouse about juxtalite application and use o Elevate legs to the level of the heart and pump ankles as often as possible Additional Orders / Instructions Wound #1 Left,Lateral Lower Leg o Vitamin A; Vitamin C, Zinc o Increase protein intake. Wound #11 Left,Medial Lower Leg o Vitamin A; Vitamin C, Zinc o Increase protein intake. Home Health Wound #1 Left,Lateral Lower Leg o Continue Home Health Visits - Please educate patient and spouse about juxtalite application and use o Home Health Nurse may visit PRN to address patientos wound care needs. o FACE TO FACE ENCOUNTER: MEDICARE and MEDICAID PATIENTS: I certify that this patient is under my care and that I had a face-to-face encounter that meets the physician face-to-face encounter requirements with this patient on this date. The encounter with the patient was in whole or in part for the following MEDICAL CONDITION: (primary reason for [place]) MEDICAL NECESSITY: I certify, that based on my findings, NURSING services are a medically necessary home health service. HOME BOUND STATUS: I certify that my clinical findings support that this patient is homebound (i.e., Due to illness or injury, pt requires aid of supportive devices such as crutches, cane, wheelchairs, walkers, the use of special transportation or the assistance of another person to leave their place of residence. There is a normal inability to leave the home and doing so requires considerable and taxing effort. Other absences are for medical reasons / religious services and are infrequent or of short duration when for other reasons). o If current dressing causes regression in wound condition, may D/C ordered dressing product/s and apply Normal Saline Moist Dressing daily until next [place] / Other MD appointment. [place] of regression in wound condition at [phone]. o Please direct any NON-WOUND related  issues/requests for orders to patient's Primary Care Physician Wound #11 Left,Medial Lower Leg o [place] Visits - Please educate patient and spouse about juxtalite application and use o Home Health Nurse may visit PRN to address patientos wound care needs. o FACE TO FACE ENCOUNTER: MEDICARE and MEDICAID PATIENTS: I certify that this patient is under my care and that I had a face-to-face encounter that meets the physician face-to-face encounter requirements with this patient on this date. The encounter with the patient was in whole or in part for the [place] ([id number]) CONDITION: (primary reason for [place]) MEDICAL NECESSITY: I certify, that based on my findings, NURSING services are a medically necessary home health service. HOME BOUND STATUS: I certify that my clinical findings support that this patient is homebound (i.e., Due to illness or injury, pt requires aid of supportive devices such as crutches, cane, wheelchairs, walkers, the use of special transportation or the assistance of another person to leave their place of residence. There is a normal inability to leave the home and doing so requires considerable and taxing effort. Other absences are for medical reasons / religious services and are infrequent or of short duration when for other reasons). o If current dressing causes regression in wound condition, may D/C ordered dressing product/s and apply Normal Saline Moist Dressing daily until next [place] / Other MD appointment. [place] of regression in wound condition at [phone]. o Please direct any NON-WOUND related issues/requests for orders to patient's Primary Care Physician Electronic Signature(s) Signed: 02/09/2017 11:35:40 AM By:  Montey Hora Signed: 02/12/2017 9:06:28 AM By: Worthy Keeler PA-C Entered By: Montey Hora on 02/09/2017 11:35:38 [name] ([id number]) -------------------------------------------------------------------------------- Problem List Details Patient Name: [name], Jillian B. Date of Service: 02/09/2017 10:15 AM Medical Record Number: [id number] Patient Account Number: [id number] Date of Birth/Sex: [birth year] (72 y.o. Female) Treating RN: Roger Shelter Primary Care Provider: Lelon Huh Other Clinician: Referring Provider: Lelon Huh Treating Provider/Extender: Melburn Hake, Britteny Fiebelkorn Weeks in Treatment: 71 Active Problems ICD-10 Encounter Code Description Active Date Diagnosis E11.622 Type 2 diabetes mellitus with other skin ulcer 10/02/2016 Yes I89.0 Lymphedema, not elsewhere classified 10/02/2016 Yes L97.212 Non-pressure chronic ulcer of right calf with fat layer exposed 10/02/2016 Yes L97.222 Non-pressure chronic ulcer of left calf with fat layer exposed 10/02/2016 Yes Inactive Problems Resolved Problems Electronic Signature(s) Signed: 02/12/2017 9:06:28 AM By: Worthy Keeler PA-C Entered By: Worthy Keeler on 02/09/2017 10:32:56 [name] ([id number]) -------------------------------------------------------------------------------- Progress Note Details Patient Name: Jillian Albe B. Date of Service: 02/09/2017 10:15 AM Medical Record Number: [id number] Patient Account Number: [id number] Date of Birth/Sex: 05-Dec-1945 (72 y.o. Female) Treating RN: Montey Hora Primary Care Provider: Lelon Huh Other Clinician: Referring Provider: Lelon Huh Treating Provider/Extender: Melburn Hake, Rhodes Calvert Weeks in Treatment: 29 Subjective Chief Complaint Information obtained from Patient Patient seen for complaints of Non-Healing Wounds to both lower extremities History of Present Illness (HPI) The following HPI elements were documented for the patient's wound: Location: both lower  extremity swelling with ulceration 72 year old patient who sees her PCP Dr. Lelon Huh was recently evaluated 10 days ago for diabetes mellitus, hypertension, CHF and hyperlipidemia. she also was noted to have ulcerations develop in her legs and she has been applying Silvadene dressings locally. In the past she has refused wound care referrals.her cardiologist Dr. Saunders Revel saw her and put her on 40 mg of furosemide daily. last hemoglobin A1c was 7.7%. she was also placed on ciprofloxacin twice  daily for 7 days and a urine culture was recommended. past medical history significant for coronary artery disease, diabetes mellitus, nonischemic cardiomyopathy and pulmonary hypertension. She is also status post heart catheterization and coronary angiography, tubal ligation and breast cyst removal in the past. She has never been a smoker. Patient had arterial studies done which showed bilateral ABIs are artificially elevated due to noncompressible and calcified vessels. Triphasic waveform throughout. Right great toe TBI is elevated while the left is normal. 10/23/2016 -- the patient is rather moribund from several issues including chronic back pain and knee pain and swelling of her legs. The large necrotic area on her left lateral anterior calf was bleeding on touch after washing her leg. There was a spot which needed silver nitrate cauterization and this was done appropriately. 11/06/2016 -- she is again noted to have friable bleeding from the left lower extremity wounds and this again had to be cauterized with silver nitrate to control the bleeding as pressure itself would not do it. 11/20/2016 -- the right lower extremity is completely healed and we have ordered 30-40 mm compression stocking's in both the dural layer and also a pair of juxta lites. 12/18/2016 -- she has not been wearing her compression stockings on her right leg and these have opened out into ulcerations again. Her left lower extremity  has circumferential ulcerations now. I believe at this stage I would like to get her venous reflux studies done to make certain that there is no fixable superficial venous reflux. 12/29/2016 -- she has made an excellent recovery having continued with her appropriate doses of diuretics and elevation and exercise. She has not yet received her right lower extremity juxta lites. Her venous duplex study is at the end of January 01/13/16 on evaluation today patient's wounds appeared to be overall doing much better much less hyper granular than previous week's evaluation. The Hydrofera Blue Dressing's to be doing very well. She does tell me that she is having a little bit more discomfort in the posterior aspect of her leg where she has a wound at this point. Fortunately there appears to be no infection. 01/19/17 on evaluation today patient appears to be doing very well in regard to her bilateral lower extremity swelling and at this point in time her left lower extremity ulcers. Her wounds appear to be doing much better. She has been tolerating the dressing's at this point fortunately she did get the Juxta-Lite compression as of today as well. Nonetheless I am pleased that she has been tolerating everything so well and that her wounds looks so good. In fact she is an excellent granular surface no evidence of slough covering and I do not see any reason for likely debridement today. Especially in regard to the posterior [name], Jillian B. ([id number]) leg. 01/26/17 on evaluation today patient appears to be doing decently well in regard to her wounds. The surface of the majority of her wounds is greatly improved. She has been using the Juxta-Lite compression which is excellent. Nonetheless the wound of the left posterior calf did require some debridement today otherwise the majority of the wounds did not require any debridement. The this was due to slough buildup on the surface. She also has a small skin flap where  two of the ulcers actually connected and this has loosened up I'm concerned that things may not heal well with that flap but for the time being I'm not gonna do anything different in that regard. 02/09/17 on evaluation patient presents regarding  her lower extremity ulcers. Fortunately these both appear to be doing excellent on evaluation today. She has been tolerating the dressing changes without complication and overall the wound bed appears to be extremely healthy no slough noted no need for debridement. Patient likewise is very pleased with her progress. Patient History Information obtained from Patient. Family History Cancer - Mother,Siblings, Diabetes - Mother,Siblings, Heart Disease - Mother, Hypertension - Siblings, No family history of Hereditary Spherocytosis, Kidney Disease, Lung Disease, Seizures, Stroke, Thyroid Problems, Tuberculosis. Social History Never smoker, Marital Status - Married, Alcohol Use - Never, Drug Use - No History, Caffeine Use - Daily. Review of Systems (ROS) Constitutional Symptoms (General Health) Denies complaints or symptoms of Fever, Chills. Respiratory The patient has no complaints or symptoms. Cardiovascular The patient has no complaints or symptoms. Psychiatric The patient has no complaints or symptoms. Objective Constitutional Obese and well-hydrated in no acute distress. Vitals Time Taken: 10:33 AM, Height: 64 in, Weight: 209 lbs, BMI: 35.9, Temperature: 97.8 F, Pulse: 57 bpm, Respiratory Rate: 18 breaths/min, Blood Pressure: 142/65 mmHg. Respiratory normal breathing without difficulty. clear to auscultation bilaterally. Cardiovascular regular rate and rhythm with normal S1, S2. Psychiatric Bramlett, Jen B. ([id number]) this patient is able to make decisions and demonstrates good insight into disease process. Alert and Oriented x 3. pleasant and cooperative. General Notes: Patient's wound bed appears to have an excellent granular surface and  there does not appear to be any evidence of infection. Overall I'm extremely pleased with the progress she's made and we are going to continue with the Current wound care measures. Integumentary (Hair, Skin) Wound #1 status is Open. Original cause of wound was Gradually Appeared. The wound is located on the Left,Lateral Lower Leg. The wound measures 2.3cm length x 2.3cm width x 0.1cm depth; 4.155cm^2 area and 0.415cm^3 volume. There is Fat Layer (Subcutaneous Tissue) Exposed exposed. There is no tunneling or undermining noted. There is a large amount of serous drainage noted. The wound margin is flat and intact. There is large (67-100%) red granulation within the wound bed. There is a small (1-33%) amount of necrotic tissue within the wound bed including Eschar. The periwound skin appearance did not exhibit: Callus, Crepitus, Excoriation, Induration, Rash, Scarring, Dry/Scaly, Maceration, Atrophie Blanche, Cyanosis, Ecchymosis, Hemosiderin Staining, Mottled, Pallor, Rubor, Erythema. Periwound temperature was noted as No Abnormality. The periwound has tenderness on palpation. Wound #11 status is Open. Original cause of wound was Gradually Appeared. The wound is located on the Left,Medial Lower Leg. The wound measures 0.5cm length x 1.4cm width x 0.1cm depth; 0.55cm^2 area and 0.055cm^3 volume. There is no tunneling or undermining noted. There is a large amount of serous drainage noted. The wound margin is flat and intact. There is large (67-100%) pink granulation within the wound bed. There is a small (1-33%) amount of necrotic tissue within the wound bed including Adherent Slough. The periwound skin appearance did not exhibit: Callus, Crepitus, Excoriation, Induration, Rash, Scarring, Dry/Scaly, Maceration, Atrophie Blanche, Cyanosis, Ecchymosis, Hemosiderin Staining, Mottled, Pallor, Rubor, Erythema. Periwound temperature was noted as No Abnormality. The periwound has tenderness on  palpation. Assessment Active Problems ICD-10 E11.622 - Type 2 diabetes mellitus with other skin ulcer I89.0 - Lymphedema, not elsewhere classified L97.212 - Non-pressure chronic ulcer of right calf with fat layer exposed L97.222 - Non-pressure chronic ulcer of left calf with fat layer exposed Procedures Wound #1 Pre-procedure diagnosis of Wound #1 is a Diabetic Wound/Ulcer of the Lower Extremity located on the Left,Lateral Lower Leg . There was a  Three Layer Compression Therapy Procedure by Montey Hora, RN. Post procedure Diagnosis Wound #1: Same as Pre-Procedure Wound #11 Pre-procedure diagnosis of Wound #11 is a Diabetic Wound/Ulcer of the Lower Extremity located on the Left,Medial Lower Leg . There was a Three Layer Compression Therapy Procedure by Montey Hora, RN. Post procedure Diagnosis Wound #11: Same as Pre-Procedure AKIMA, SLAUGH ([id number]) Plan Wound Cleansing: Wound #1 Left,Lateral Lower Leg: Clean wound with Normal Saline. Cleanse wound with mild soap and water May Shower, gently pat wound dry prior to applying new dressing. Wound #11 Left,Medial Lower Leg: Clean wound with Normal Saline. Cleanse wound with mild soap and water May Shower, gently pat wound dry prior to applying new dressing. Anesthetic (add to Medication List): Wound #1 Left,Lateral Lower Leg: Topical Lidocaine 4% cream applied to wound bed prior to debridement (In Clinic Only). Wound #11 Left,Medial Lower Leg: Topical Lidocaine 4% cream applied to wound bed prior to debridement (In Clinic Only). Primary Wound Dressing: Wound #1 Left,Lateral Lower Leg: Hydrafera Blue - Please make sure this is Hydrafera Blue Ready Transfer Wound #11 Left,Medial Lower Leg: Hydrafera Blue - Please make sure this is Hydrafera Blue Ready Transfer Secondary Dressing: Wound #1 Left,Lateral Lower Leg: ABD pad Wound #11 Left,Medial Lower Leg: ABD pad Dressing Change Frequency: Wound #1 Left,Lateral Lower  Leg: Change Dressing Monday, Wednesday, Friday - HHRN to change Monday, Wednesday and Fridays and pt to come to [place] on Fridays. Wound #11 Left,Medial Lower Leg: Change Dressing Monday, Wednesday, Friday - HHRN to change Monday, Wednesday and Fridays and pt to come to [facility] on Fridays. Follow-up Appointments: Wound #1 Left,Lateral Lower Leg: Return Appointment in 1 week. Wound #11 Left,Medial Lower Leg: Return Appointment in 1 week. Edema Control: Wound #1 Left,Lateral Lower Leg: 3 Layer Compression System - Left Lower Extremity - ****Make sure you are only putting a 3-layer wrap on the left leg**** wrap up to 3 finger widths below the knee. Patient to wear own Juxtalite/Juzo compression garment. - Please educate patient and spouse about juxtalite application and use Elevate legs to the level of the heart and pump ankles as often as possible Additional Orders / Instructions: Wound #1 Left,Lateral Lower Leg: Vitamin A; Vitamin C, Zinc Increase protein intake. Wound #11 Left,Medial Lower Leg: Vitamin A; Vitamin C, Zinc Increase protein intake. Home Health: Wound #1 Left,Lateral Lower Leg: Continue Home Health Visits - Please educate patient and spouse about juxtalite application and use Home Health Nurse may visit PRN to address patient s wound care needs. [name], [name] ([id number]) FACE TO FACE ENCOUNTER: MEDICARE and MEDICAID PATIENTS: I certify that this patient is under my care and that I had a face-to-face encounter that meets the physician face-to-face encounter requirements with this patient on this date. The encounter with the patient was in whole or in part for the following MEDICAL CONDITION: (primary reason for [place]) MEDICAL NECESSITY: I certify, that based on my findings, NURSING services are a medically necessary home health service. HOME BOUND STATUS: I certify that my clinical findings support that this patient is homebound (i.e.,  Due to illness or injury, pt requires aid of supportive devices such as crutches, cane, wheelchairs, walkers, the use of special transportation or the assistance of another person to leave their place of residence. There is a normal inability to leave the home and doing so requires considerable and taxing effort. Other absences are for medical reasons / religious services and are infrequent or of short duration  when for other reasons). If current dressing causes regression in wound condition, may D/C ordered dressing product/s and apply Normal Saline Moist Dressing daily until next [place] / Other MD appointment. [place] of regression in wound condition at [phone]. Please direct any NON-WOUND related issues/requests for orders to patient's Primary Care Physician Wound #11 Left,Medial Lower Leg: [place] Visits - Please educate patient and spouse about juxtalite application and use Home Health Nurse may visit PRN to address patient s wound care needs. FACE TO FACE ENCOUNTER: MEDICARE and MEDICAID PATIENTS: I certify that this patient is under my care and that I had a face-to-face encounter that meets the physician face-to-face encounter requirements with this patient on this date. The encounter with the patient was in whole or in part for the following MEDICAL CONDITION: (primary reason for [place]) MEDICAL NECESSITY: I certify, that based on my findings, NURSING services are a medically necessary home health service. HOME BOUND STATUS: I certify that my clinical findings support that this patient is homebound (i.e., Due to illness or injury, pt requires aid of supportive devices such as crutches, cane, wheelchairs, walkers, the use of special transportation or the assistance of another person to leave their place of residence. There is a normal inability to leave the home and doing so requires considerable and taxing effort. Other absences  are for medical reasons / religious services and are infrequent or of short duration when for other reasons). If current dressing causes regression in wound condition, may D/C ordered dressing product/s and apply Normal Saline Moist Dressing daily until next [place] / Other MD appointment. [place] of regression in wound condition at [phone]. Please direct any NON-WOUND related issues/requests for orders to patient's Primary Care Physician We will continue with the [facility] Dressing's as this seems to have been of great benefit for the patient up to this point. I'll to see if things saw we will switch to something else. Otherwise we will see were things stand in one weeks time. I will reevaluate this patients condition in 2 week(s). Please see above for specific wound care measures. If anything worsens in the interim nursing staff will contact me for additional recommendations. Electronic Signature(s) Signed: 02/12/2017 9:06:28 AM By: Worthy Keeler PA-C Entered By: Worthy Keeler on 02/09/2017 17:59:09 [name] ([id number]) -------------------------------------------------------------------------------- ROS/PFSH Details Patient Name: [name], Jillian B. Date of Service: 02/09/2017 10:15 AM Medical Record Number: [id number] Patient Account Number: [id number] Date of Birth/Sex: [birth year] (72 y.o. Female) Treating RN: Montey Hora Primary Care Provider: Lelon Huh Other Clinician: Referring Provider: Lelon Huh Treating Provider/Extender: Melburn Hake, Misha Antonini Weeks in Treatment: 86 Information Obtained From Patient Wound History Do you currently have one or more open woundso Yes How many open wounds do you currently haveo 6 Approximately how long have you had your woundso 2 months How have you been treating your wound(s) until nowo [place] Has your wound(s) ever healed and then re-openedo No Have you had any lab work done in the past  montho No Have you tested positive for an antibiotic resistant organism (MRSA, VRE)o No Have you tested positive for osteomyelitis (bone infection)o No Have you had any tests for circulation on your legso Yes Where was the test doneo [facility] Have you had other problems associated with your woundso Infection, Swelling Constitutional Symptoms (General Health) Complaints and Symptoms: Negative for: Fever; Chills Respiratory Complaints and Symptoms: No Complaints or Symptoms Medical History: Positive for:  Asthma Cardiovascular Complaints and Symptoms: No Complaints or Symptoms Medical History: Positive for: Congestive Heart Failure; Coronary Artery Disease; Hypertension Endocrine Medical History: Positive for: Type II Diabetes Time with diabetes: a long time Treated with: Oral agents Blood sugar tested every day: No Musculoskeletal [name], [name] ([id number]) Medical History: Positive for: Osteoarthritis Psychiatric Complaints and Symptoms: No Complaints or Symptoms Immunizations Pneumococcal Vaccine: Received Pneumococcal Vaccination: Yes Implantable Devices Family and Social History Cancer: Yes - Mother,Siblings; Diabetes: Yes - Mother,Siblings; Heart Disease: Yes - Mother; Hereditary Spherocytosis: No; Hypertension: Yes - Siblings; Kidney Disease: No; Lung Disease: No; Seizures: No; Stroke: No; Thyroid Problems: No; Tuberculosis: No; Never smoker; Marital Status - Married; Alcohol Use: Never; Drug Use: No History; Caffeine Use: Daily; Financial Concerns: No; Food, Clothing or Shelter Needs: No; Support System Lacking: No; Transportation Concerns: No; Advanced Directives: No; Patient does not want information on Advanced Directives; Do not resuscitate: No; Living Will: No; Medical Power of Attorney: No Physician Affirmation I have reviewed and agree with the above information. Electronic Signature(s) Signed: 02/12/2017 9:06:28 AM By: Worthy Keeler PA-C Signed: 02/12/2017  4:44:52 PM By: Montey Hora Entered By: Worthy Keeler on 02/09/2017 17:58:26 Billinger, Jeanice Lim ([id number]) -------------------------------------------------------------------------------- SuperBill Details Patient Name: CALIOPE, Jillian B. Date of Service: 02/09/2017 Medical Record Number: [id number] Patient Account Number: [id number] Date of Birth/Sex: 10-07-1945 (72 y.o. Female) Treating RN: Montey Hora Primary Care Provider: Lelon Huh Other Clinician: Referring Provider: Lelon Huh Treating Provider/Extender: Melburn Hake, Jameisha Stofko Weeks in Treatment: 18 Diagnosis Coding ICD-10 Codes Code Description E11.622 Type 2 diabetes mellitus with other skin ulcer I89.0 Lymphedema, not elsewhere classified L97.212 Non-pressure chronic ulcer of right calf with fat layer exposed L97.222 Non-pressure chronic ulcer of left calf with fat layer exposed Facility Procedures CPT4 Code: 21975883 Description: (Facility Use Only) [phone] - [place] LWR LT LEG Modifier: Quantity: 1 Physician Procedures CPT4 Code: 4158309 Description: 40768 - WC PHYS LEVEL 3 - EST PT ICD-10 Diagnosis Description E11.622 Type 2 diabetes mellitus with other skin ulcer I89.0 Lymphedema, not elsewhere classified L97.212 Non-pressure chronic ulcer of right calf with fat layer exp L97.222  Non-pressure chronic ulcer of left calf with fat layer expo Modifier: osed sed Quantity: 1 Electronic Signature(s) Signed: 02/12/2017 9:06:28 AM By: Worthy Keeler PA-C Previous Signature: 02/09/2017 11:36:27 AM Version By: Montey Hora Entered By: Worthy Keeler on 02/09/2017 17:59:38

## 2017-02-14 ENCOUNTER — Ambulatory Visit (INDEPENDENT_AMBULATORY_CARE_PROVIDER_SITE_OTHER): Payer: Medicare Other

## 2017-02-14 ENCOUNTER — Ambulatory Visit (INDEPENDENT_AMBULATORY_CARE_PROVIDER_SITE_OTHER): Payer: Medicare Other | Admitting: Vascular Surgery

## 2017-02-14 ENCOUNTER — Other Ambulatory Visit (INDEPENDENT_AMBULATORY_CARE_PROVIDER_SITE_OTHER): Payer: Self-pay | Admitting: Vascular Surgery

## 2017-02-14 ENCOUNTER — Encounter (INDEPENDENT_AMBULATORY_CARE_PROVIDER_SITE_OTHER): Payer: Self-pay | Admitting: Vascular Surgery

## 2017-02-14 VITALS — BP 140/76 | HR 62 | Resp 18 | Ht 62.0 in | Wt 206.0 lb

## 2017-02-14 DIAGNOSIS — I872 Venous insufficiency (chronic) (peripheral): Secondary | ICD-10-CM | POA: Diagnosis not present

## 2017-02-14 DIAGNOSIS — I509 Heart failure, unspecified: Secondary | ICD-10-CM | POA: Diagnosis not present

## 2017-02-14 DIAGNOSIS — S81809D Unspecified open wound, unspecified lower leg, subsequent encounter: Secondary | ICD-10-CM

## 2017-02-14 DIAGNOSIS — Z4800 Encounter for change or removal of nonsurgical wound dressing: Secondary | ICD-10-CM | POA: Diagnosis not present

## 2017-02-14 DIAGNOSIS — L97221 Non-pressure chronic ulcer of left calf limited to breakdown of skin: Secondary | ICD-10-CM | POA: Diagnosis not present

## 2017-02-14 DIAGNOSIS — M1990 Unspecified osteoarthritis, unspecified site: Secondary | ICD-10-CM | POA: Diagnosis not present

## 2017-02-14 DIAGNOSIS — R609 Edema, unspecified: Secondary | ICD-10-CM | POA: Diagnosis not present

## 2017-02-14 DIAGNOSIS — Z7951 Long term (current) use of inhaled steroids: Secondary | ICD-10-CM | POA: Diagnosis not present

## 2017-02-14 DIAGNOSIS — I890 Lymphedema, not elsewhere classified: Secondary | ICD-10-CM | POA: Diagnosis not present

## 2017-02-14 DIAGNOSIS — Z79891 Long term (current) use of opiate analgesic: Secondary | ICD-10-CM | POA: Diagnosis not present

## 2017-02-14 DIAGNOSIS — L97222 Non-pressure chronic ulcer of left calf with fat layer exposed: Secondary | ICD-10-CM | POA: Diagnosis not present

## 2017-02-14 DIAGNOSIS — I429 Cardiomyopathy, unspecified: Secondary | ICD-10-CM | POA: Diagnosis not present

## 2017-02-14 DIAGNOSIS — J45909 Unspecified asthma, uncomplicated: Secondary | ICD-10-CM | POA: Diagnosis not present

## 2017-02-14 DIAGNOSIS — L97821 Non-pressure chronic ulcer of other part of left lower leg limited to breakdown of skin: Secondary | ICD-10-CM | POA: Diagnosis not present

## 2017-02-14 DIAGNOSIS — Z7984 Long term (current) use of oral hypoglycemic drugs: Secondary | ICD-10-CM | POA: Diagnosis not present

## 2017-02-14 DIAGNOSIS — I110 Hypertensive heart disease with heart failure: Secondary | ICD-10-CM | POA: Diagnosis not present

## 2017-02-14 DIAGNOSIS — I2510 Atherosclerotic heart disease of native coronary artery without angina pectoris: Secondary | ICD-10-CM | POA: Diagnosis not present

## 2017-02-14 DIAGNOSIS — E11622 Type 2 diabetes mellitus with other skin ulcer: Secondary | ICD-10-CM | POA: Diagnosis not present

## 2017-02-14 DIAGNOSIS — I272 Pulmonary hypertension, unspecified: Secondary | ICD-10-CM | POA: Diagnosis not present

## 2017-02-14 NOTE — Progress Notes (Signed)
Subjective:    Patient ID: [name], female    DOB: [birth year], 72 y.o.   MRN: [id number] Chief Complaint  Patient presents with  . New Patient (Initial Visit)    LE venous reflux   Presents as a new patient referred by Dr. Con Memos to rule out venous disease.  Patient seen with her husband.  The patient and her husband endorse a six month history of slow healing ulcerations to the bilateral calves.  At this time, the ulcerations to the right lower extremity have healed.  There are a few scattered almost healed ulcerations to the left lower extremity.  The patient was treated with wound care provided by the wound center and 3 layer zinc oxide Unna wraps to the bilateral lower extremity.  The patient denies any claudication-like symptoms or rest pain.  The patient used to experience bilateral lower extremity edema however over the last couple months both the patient and her husband state that this has improved.  The patient has a pair of medical grade 1 compression stockings to transition into once therapy with Unna wraps are over.  The patient underwent a bilateral lower venous reflux study which was notable for normal reflux to the right lower extremity with no evidence of superficial or deep vein thrombosis.  Left lower extremity was notable for reflux in the common femoral vein, popliteal vein, great saphenous vein with no evidence of deep vein or superficial vein thrombosis.  Patient denies any recent bouts of cellulitis.  She denies any fever, nausea vomiting.   Review of Systems  Constitutional: Negative.   HENT: Negative.   Eyes: Negative.   Respiratory: Negative.   Cardiovascular: Positive for leg swelling.  Gastrointestinal: Negative.   Endocrine: Negative.   Genitourinary: Negative.   Musculoskeletal: Negative.   Skin: Positive for wound.  Allergic/Immunologic: Negative.   Neurological: Negative.   Hematological: Negative.   Psychiatric/Behavioral: Negative.         Objective:   Physical Exam  Constitutional: She is oriented to person, place, and time. She appears well-developed and well-nourished. No distress.  HENT:  Head: Normocephalic and atraumatic.  Eyes: Conjunctivae are normal. Pupils are equal, round, and reactive to light.  Neck: Normal range of motion.  Cardiovascular: Normal rate, regular rhythm, normal heart sounds and intact distal pulses.  Pulses:      Radial pulses are 2+ on the right side, and 2+ on the left side.  Hard to palpate pedal pulses however the bilateral feet are warm  Pulmonary/Chest: Effort normal and breath sounds normal.  Musculoskeletal: Normal range of motion. She exhibits edema (Mild right lower extremity nonpitting edema noted.  Mild to moderate left lower extremity nonpitting edema noted).  Neurological: She is alert and oriented to person, place, and time.  Skin: She is not diaphoretic.  There is mild stasis dermatitis and no ulcerations noted to the right lower extremity.  Left lower extremity has a few scattered noninfected shallow small ulcerations noted to the calf.  Moderate stasis dermatitis. There is no cellulitis.  Vitals reviewed.  BP 140/76 (BP Location: Right Arm, Patient Position: Sitting)   Pulse 62   Resp 18   Ht 5\' 2"  (1.575 m)   Wt 206 lb (93.4 kg)   BMI 37.68 kg/m   Past Medical History:  Diagnosis Date  . Aortic stenosis   . Asthma   . Coronary artery disease   . Diabetes mellitus without complication ([place])   . History of measles   .  Hypertension   . Nonischemic cardiomyopathy ([place])   . Pulmonary hypertension ([place])    Social History   Socioeconomic History  . Marital status: Married    Spouse name: Not on file  . Number of children: Not on file  . Years of education: Not on file  . Highest education level: Not on file  Social Needs  . Financial resource strain: Not on file  . Food insecurity - worry: Not on file  . Food insecurity - inability: Not on file  . Transportation  needs - medical: Not on file  . Transportation needs - non-medical: Not on file  Occupational History  . Not on file  Tobacco Use  . Smoking status: Never Smoker  . Smokeless tobacco: Never Used  Substance and Sexual Activity  . Alcohol use: No  . Drug use: No  . Sexual activity: Not on file  Other Topics Concern  . Not on file  Social History Narrative  . Not on file   Past Surgical History:  Procedure Laterality Date  . Cyst(solitary) of breast:removed    . RIGHT/LEFT HEART CATH AND CORONARY ANGIOGRAPHY N/A 03/07/2016   Procedure: Right/Left Heart Cath and Coronary Angiography;  Surgeon: Nelva Bush, MD;  Location: [place] [facility];  Service: Cardiovascular;  Laterality: N/A;  . TUBAL LIGATION     Family History  Problem Relation Age of Onset  . Cancer Mother        Uterine cancer  . Diabetes Mother   . Heart disease Mother   . Heart failure Mother   . Parkinson's disease Father   . Cancer Sister        breast cancer  . Diabetes Brother        Non-insulin Dependent Diabetes Mellitus  . Mesothelioma Brother   . Hypertension Sister   . Diabetes Sister        Non-insulin dependent Diabetes Mellitus   Allergies  Allergen Reactions  . Hydrochlorothiazide     Leg Cramps  . Penicillins Rash      Assessment & Plan:  Presents as a new patient referred by Dr. Con Memos to rule out venous disease.  Patient seen with her husband.  The patient and her husband endorse a six month history of slow healing ulcerations to the bilateral calves.  At this time, the ulcerations to the right lower extremity have healed.  There are a few scattered almost healed ulcerations to the left lower extremity.  The patient was treated with wound care provided by the wound center and 3 layer zinc oxide Unna wraps to the bilateral lower extremity.  The patient denies any claudication-like symptoms or rest pain.  The patient used to experience bilateral lower extremity edema however over the last  couple months both the patient and her husband state that this has improved.  The patient has a pair of medical grade 1 compression stockings to transition into once therapy with Unna wraps are over.  The patient underwent a bilateral lower venous reflux study which was notable for normal reflux to the right lower extremity with no evidence of superficial or deep vein thrombosis.  Left lower extremity was notable for reflux in the common femoral vein, popliteal vein, great saphenous vein with no evidence of deep vein or superficial vein thrombosis.  Patient denies any recent bouts of cellulitis.  She denies any fever, nausea vomiting.  1. Multiple open wounds of lower leg, unspecified laterality, subsequent encounter - Stable There are no ulcerations or cellulitis noted  to the right lower extremity There are a few small scattered ulcerations noted to the left however this extremity still being treated with 3 layer zinc oxide Unna wrap The patient will continue her wound care at the wound center for her left lower extremity  2. Lymphedema - new Studies reviewed with the patient. I discussed lymphedema and what symptoms it causes and how to manage them. The patient was encouraged to wear graduated compression stockings (20-30 mmHg) on a daily basis. The patient was instructed to begin wearing the stockings first thing in the morning and removing them in the evening. The patient was instructed specifically not to sleep in the stockings. Prescription given. In addition, behavioral modification including elevation during the day will be initiated. We discussed a lymphedema pump if conventional therapy goes not work. At this time, the patient does not want to make a follow-up appointment however will call the office if conservative therapy does not control her lower extremity edema Information on compression stockings, lymphedema and the lymphedema pump was given to the patient. The patient was instructed  to call the office in the interim if any worsening edema or ulcerations to the legs, feet or toes occurs. The patient expresses their understanding.  3. Chronic venous insufficiency - new Study reviewed with patient. I have discussed chronic venous insufficiency with the patient, why it causes symptoms and how to manage them.  The patient was encouraged to wear graduated compression stockings (20-30 mmHg) on a daily basis. The patient was instructed to begin wearing the stockings first thing in the morning and removing them in the evening. The patient was instructed specifically not to sleep in the stockings. Prescription given. In addition, behavioral modification including elevation during the day will be initiated. Anti-inflammatories for pain. The patient is likely to benefit from endovenous laser ablation. I have discussed the risks and benefits of the procedure. The risks primarily include DVT, recanalization, bleeding, infection, and inability to gain access. At this time, the patient is not interested in moving forward with laser ablation Information on chronic venous insufficiency and compression stockings was given to the patient. The patient was instructed to call the office in the interim if any worsening edema or ulcerations to the legs, feet or toes occurs. The patient expresses their understanding.  Current Outpatient Medications on File Prior to Visit  Medication Sig Dispense Refill  . amLODipine (NORVASC) 5 MG tablet TAKE 1 TABLET (5 MG TOTAL) BY MOUTH DAILY. 30 tablet 5  . aspirin EC 81 MG tablet Take 1 tablet (81 mg total) by mouth daily. 90 tablet 3  . fluticasone furoate-vilanterol (BREO ELLIPTA) 200-25 MCG/INH AEPB Inhale 1 puff into the lungs daily. FOR ASTHMA J45.20 (Patient not taking: Reported on 12/28/2016) 1 each 5  . furosemide (LASIX) 40 MG tablet Take 1 tablet (40 mg total) by mouth daily. 90 tablet 3  . HYDROcodone-acetaminophen (NORCO) 10-325 MG tablet Take 1  tablet by mouth every 8 (eight) hours as needed. (Patient not taking: Reported on 12/28/2016) 90 tablet 0  . lisinopril (PRINIVIL,ZESTRIL) 20 MG tablet Take 1 tablet (20 mg total) daily by mouth. 90 tablet 3  . lovastatin (MEVACOR) 20 MG tablet TAKE 1 TABLET (20 MG TOTAL) BY MOUTH AT BEDTIME. 90 tablet 4  . meloxicam (MOBIC) 15 MG tablet Take 1 tablet (15 mg total) by mouth daily as needed for pain. Take with food (Patient not taking: Reported on 12/28/2016) 90 tablet 4  . metFORMIN (GLUCOPHAGE-XR) 750 MG 24 hr tablet  Take 2 tablets by mouth daily.  4  . metoprolol (LOPRESSOR) 50 MG tablet Take 1 tablet (50 mg total) by mouth 2 (two) times daily. 180 tablet 3  . silver sulfADIAZINE (SILVADENE) 1 % cream APPLY TO AFFECTED AREA DAILY 85 g 5  . spironolactone (ALDACTONE) 25 MG tablet TAKE 1 TABLET (25 MG TOTAL) BY MOUTH DAILY. 30 tablet 12  . traZODone (DESYREL) 100 MG tablet TAKE 1 TABLET BY MOUTH EVERY NIGHT AT BEDTIME AS NEEDED FOR INSOMNIA 90 tablet 4   No current facility-administered medications on file prior to visit.    There are no Patient Instructions on file for this visit. No Follow-up on file.  KIMBERLY A STEGMAYER, PA-C

## 2017-02-16 ENCOUNTER — Encounter: Payer: Medicare Other | Admitting: Physician Assistant

## 2017-02-16 DIAGNOSIS — Z7982 Long term (current) use of aspirin: Secondary | ICD-10-CM | POA: Diagnosis not present

## 2017-02-16 DIAGNOSIS — L97222 Non-pressure chronic ulcer of left calf with fat layer exposed: Secondary | ICD-10-CM | POA: Diagnosis not present

## 2017-02-16 DIAGNOSIS — E11621 Type 2 diabetes mellitus with foot ulcer: Secondary | ICD-10-CM | POA: Diagnosis not present

## 2017-02-16 DIAGNOSIS — I509 Heart failure, unspecified: Secondary | ICD-10-CM | POA: Diagnosis not present

## 2017-02-16 DIAGNOSIS — Z79899 Other long term (current) drug therapy: Secondary | ICD-10-CM | POA: Diagnosis not present

## 2017-02-16 DIAGNOSIS — I890 Lymphedema, not elsewhere classified: Secondary | ICD-10-CM | POA: Diagnosis not present

## 2017-02-16 DIAGNOSIS — I272 Pulmonary hypertension, unspecified: Secondary | ICD-10-CM | POA: Diagnosis not present

## 2017-02-16 DIAGNOSIS — E785 Hyperlipidemia, unspecified: Secondary | ICD-10-CM | POA: Diagnosis not present

## 2017-02-16 DIAGNOSIS — I429 Cardiomyopathy, unspecified: Secondary | ICD-10-CM | POA: Diagnosis not present

## 2017-02-16 DIAGNOSIS — E11622 Type 2 diabetes mellitus with other skin ulcer: Secondary | ICD-10-CM | POA: Diagnosis not present

## 2017-02-16 DIAGNOSIS — J45909 Unspecified asthma, uncomplicated: Secondary | ICD-10-CM | POA: Diagnosis not present

## 2017-02-16 DIAGNOSIS — Z7984 Long term (current) use of oral hypoglycemic drugs: Secondary | ICD-10-CM | POA: Diagnosis not present

## 2017-02-16 DIAGNOSIS — I2510 Atherosclerotic heart disease of native coronary artery without angina pectoris: Secondary | ICD-10-CM | POA: Diagnosis not present

## 2017-02-16 DIAGNOSIS — L97212 Non-pressure chronic ulcer of right calf with fat layer exposed: Secondary | ICD-10-CM | POA: Diagnosis not present

## 2017-02-16 DIAGNOSIS — I110 Hypertensive heart disease with heart failure: Secondary | ICD-10-CM | POA: Diagnosis not present

## 2017-02-19 DIAGNOSIS — L97222 Non-pressure chronic ulcer of left calf with fat layer exposed: Secondary | ICD-10-CM | POA: Diagnosis not present

## 2017-02-19 DIAGNOSIS — L97821 Non-pressure chronic ulcer of other part of left lower leg limited to breakdown of skin: Secondary | ICD-10-CM | POA: Diagnosis not present

## 2017-02-19 DIAGNOSIS — I509 Heart failure, unspecified: Secondary | ICD-10-CM | POA: Diagnosis not present

## 2017-02-19 DIAGNOSIS — Z7951 Long term (current) use of inhaled steroids: Secondary | ICD-10-CM | POA: Diagnosis not present

## 2017-02-19 DIAGNOSIS — I110 Hypertensive heart disease with heart failure: Secondary | ICD-10-CM | POA: Diagnosis not present

## 2017-02-19 DIAGNOSIS — I272 Pulmonary hypertension, unspecified: Secondary | ICD-10-CM | POA: Diagnosis not present

## 2017-02-19 DIAGNOSIS — I2510 Atherosclerotic heart disease of native coronary artery without angina pectoris: Secondary | ICD-10-CM | POA: Diagnosis not present

## 2017-02-19 DIAGNOSIS — I429 Cardiomyopathy, unspecified: Secondary | ICD-10-CM | POA: Diagnosis not present

## 2017-02-19 DIAGNOSIS — Z79891 Long term (current) use of opiate analgesic: Secondary | ICD-10-CM | POA: Diagnosis not present

## 2017-02-19 DIAGNOSIS — Z4800 Encounter for change or removal of nonsurgical wound dressing: Secondary | ICD-10-CM | POA: Diagnosis not present

## 2017-02-19 DIAGNOSIS — M1990 Unspecified osteoarthritis, unspecified site: Secondary | ICD-10-CM | POA: Diagnosis not present

## 2017-02-19 DIAGNOSIS — Z7984 Long term (current) use of oral hypoglycemic drugs: Secondary | ICD-10-CM | POA: Diagnosis not present

## 2017-02-19 DIAGNOSIS — E11622 Type 2 diabetes mellitus with other skin ulcer: Secondary | ICD-10-CM | POA: Diagnosis not present

## 2017-02-19 DIAGNOSIS — J45909 Unspecified asthma, uncomplicated: Secondary | ICD-10-CM | POA: Diagnosis not present

## 2017-02-20 NOTE — Progress Notes (Signed)
OPRAH, CAMARENA ([id number]) Visit Report for 02/16/2017 Arrival Information Details Patient Name: [name], [name]. Date of Service: 02/16/2017 10:15 AM Medical Record Number: [id number] Patient Account Number: [id number] Date of Birth/Sex: 1945-07-28 (72 y.o. Female) Treating RN: Montey Hora Primary Care Aliannah Holstrom: Lelon Huh Other Clinician: Referring Kelilah Hebard: Lelon Huh Treating Cindy Fullman/Extender: Melburn Hake, HOYT Weeks in Treatment: 27 Visit Information History Since Last Visit Added or deleted any medications: No Patient Arrived: Wheel Chair Any new allergies or adverse reactions: No Arrival Time: 10:27 Had a fall or experienced change in No activities of daily living that may affect Accompanied By: spouse risk of falls: Transfer Assistance: None Signs or symptoms of abuse/neglect since last visito No Patient Identification Verified: Yes Hospitalized since last visit: No Secondary Verification Process Completed: Yes Has Dressing in Place as Prescribed: Yes Patient Requires Transmission-Based No Has Compression in Place as Prescribed: Yes Precautions: Pain Present Now: Yes Patient Has Alerts: Yes Electronic Signature(s) Signed: 02/16/2017 4:58:58 PM By: Montey Hora Entered By: Montey Hora on 02/16/2017 10:27:52 [name] ([id number]) -------------------------------------------------------------------------------- Compression Therapy Details Patient Name: Jillian Albe B. Date of Service: 02/16/2017 10:15 AM Medical Record Number: [id number] Patient Account Number: [id number] Date of Birth/Sex: March 22, 1945 (72 y.o. Female) Treating RN: Montey Hora Primary Care Paisely Brick: Lelon Huh Other Clinician: Referring Ivory Maduro: Lelon Huh Treating Candida Vetter/Extender: Melburn Hake, HOYT Weeks in Treatment: 19 Compression Therapy Performed for Wound Assessment: Wound #11 Left,Medial Lower Leg Performed By: Clinician Montey Hora, RN Compression Type: Three  Layer Post Procedure Diagnosis Same as Pre-procedure Electronic Signature(s) Signed: 02/16/2017 4:58:58 PM By: Montey Hora Entered By: Montey Hora on 02/16/2017 10:54:53 [name] ([id number]) -------------------------------------------------------------------------------- Compression Therapy Details Patient Name: [name], Jillian B. Date of Service: 02/16/2017 10:15 AM Medical Record Number: [id number] Patient Account Number: [id number] Date of Birth/Sex: [birth year] (72 y.o. Female) Treating RN: Montey Hora Primary Care Adolph Clutter: Lelon Huh Other Clinician: Referring Kristia Jupiter: Lelon Huh Treating Munirah Doerner/Extender: Melburn Hake, HOYT Weeks in Treatment: 19 Compression Therapy Performed for Wound Assessment: Wound #1 Left,Lateral Lower Leg Performed By: Clinician Montey Hora, RN Compression Type: Three Layer Post Procedure Diagnosis Same as Pre-procedure Electronic Signature(s) Signed: 02/16/2017 4:58:58 PM By: Montey Hora Entered By: Montey Hora on 02/16/2017 10:54:53 [name] ([id number]) -------------------------------------------------------------------------------- Encounter Discharge Information Details Patient Name: [name], Jillian B. Date of Service: 02/16/2017 10:15 AM Medical Record Number: [id number] Patient Account Number: [id number] Date of Birth/Sex: August 13, 1945 (72 y.o. Female) Treating RN: Montey Hora Primary Care Khalid Lacko: Lelon Huh Other Clinician: Referring Genessa Beman: Lelon Huh Treating Finlee Milo/Extender: Melburn Hake, HOYT Weeks in Treatment: 73 Encounter Discharge Information Items Discharge Pain Level: 0 Discharge Condition: Stable Ambulatory Status: Wheelchair Discharge Destination: Home Transportation: Private Auto Accompanied By: spouse Schedule Follow-up Appointment: Yes Medication Reconciliation completed and No provided to Patient/Care Katrinna Travieso: Provided on Clinical Summary of Care: 02/16/2017 Form Type  Recipient Paper Patient CS Electronic Signature(s) Signed: 02/19/2017 4:58:43 PM By: Ruthine Dose Entered By: Ruthine Dose on 02/16/2017 11:15:20 [name] ([id number]) -------------------------------------------------------------------------------- Lower Extremity Assessment Details Patient Name: Jillian Albe B. Date of Service: 02/16/2017 10:15 AM Medical Record Number: [id number] Patient Account Number: [id number] Date of Birth/Sex: 1945-03-20 (72 y.o. Female) Treating RN: Montey Hora Primary Care Ariyanna Oien: Lelon Huh Other Clinician: Referring Nelani Schmelzle: Lelon Huh Treating Ivyanna Sibert/Extender: Melburn Hake, HOYT Weeks in Treatment: 19 Edema Assessment Assessed: [Left: No] [Right: No] [Left: Edema] [Right: :] Calf Left: Right: Point of Measurement: 30 cm From Medial Instep 37.6 cm cm Ankle Left: Right: Point of Measurement: 10 cm From Medial Instep  22.6 cm cm Vascular Assessment Pulses: Dorsalis Pedis Palpable: [Left:Yes] Posterior Tibial Extremity colors, hair growth, and conditions: Extremity Color: [Left:Hyperpigmented] Hair Growth on Extremity: [Left:No] Temperature of Extremity: [Left:Warm] Capillary Refill: [Left:< 3 seconds] Toe Nail Assessment Left: Right: Thick: Yes Discolored: Yes Deformed: No Improper Length and Hygiene: No Electronic Signature(s) Signed: 02/16/2017 4:58:58 PM By: Montey Hora Entered By: Montey Hora on 02/16/2017 10:44:29 [name] ([id number]) -------------------------------------------------------------------------------- Multi Wound Chart Details Patient Name: Jillian Albe B. Date of Service: 02/16/2017 10:15 AM Medical Record Number: [id number] Patient Account Number: [id number] Date of Birth/Sex: 09-Jun-1945 (72 y.o. 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] [11:Lower Extremity] Comorbid History: [1:Asthma, Congestive Heart Failure, Coronary Artery Disease, Hypertension, Type II Disease, Hypertension, Type II Diabetes, Osteoarthritis] [11:Asthma, Congestive Heart Failure, Coronary Artery Diabetes, Osteoarthritis] [N/A:N/A] Date Acquired: [1:08/02/2016] [11:01/26/2017] [N/A:N/A] Weeks of Treatment: [1:19] [11:3] [N/A:N/A] Wound Status: [1:Open] [11:Open] [N/A:N/A] Clustered Wound: [1:No] [11:Yes] [N/A:N/A] Measurements L x W x D [1:1.9x1.8x0.1] [11:0.1x0.1x0.1] [N/A:N/A] (cm) Area (cm) : [1:2.686] [11:0.008] [N/A:N/A] Volume (cm) : [1:0.269] [11:0.001] [N/A:N/A] % Reduction in Area: [1:84.50%] [11:99.90%] [N/A:N/A] % Reduction in Volume: [1:84.40%] [11:99.90%] [N/A:N/A] Classification: [1:Grade 2] [11:Grade 2] [N/A:N/A] Exudate Amount: [1:Large] [11:Small] [N/A:N/A] Exudate Type: [1:Serous] [11:Serous] [N/A:N/A] Exudate Color: [1:amber] [11:amber] [N/A:N/A] Wound Margin: [1:Flat and Intact] [11:Flat and Intact] [N/A:N/A] Granulation Amount: [1:Large (67-100%)] [11:Large (67-100%)] [N/A:N/A] Granulation Quality: [1:Red] [11:Pink] [N/A:N/A] Necrotic Amount: [1:None Present (0%)] [11:None Present (0%)] [N/A:N/A] Exposed Structures: [1:Fat Layer (Subcutaneous Tissue) Exposed: Yes Fascia: No Tendon: No Muscle: No Joint: No Bone: No] [11:Fascia: No Fat Layer (Subcutaneous Tissue) Exposed: No Tendon: No Muscle: No Joint: No Bone: No] [N/A:N/A] Epithelialization: [1:Small  (1-33%)] [11:Large (67-100%)] [N/A:N/A] Periwound Skin Texture: [1:Excoriation: No Induration: No] [11:Excoriation: No Induration: No] [N/A:N/A] Callus: No Callus: No Crepitus: No Crepitus: No Rash: No Rash: No Scarring: No Scarring: No Periwound Skin Moisture: Maceration: No Maceration: No N/A Dry/Scaly: No Dry/Scaly: No Periwound Skin Color: Atrophie Blanche: No Atrophie Blanche: No N/A Cyanosis: No Cyanosis: No Ecchymosis: No Ecchymosis: No Erythema: No Erythema: No Hemosiderin Staining: No Hemosiderin Staining: No Mottled: No Mottled: No Pallor: No Pallor: No Rubor: No Rubor: No Temperature: No Abnormality No Abnormality N/A Tenderness on Palpation: Yes Yes N/A Wound Preparation: Ulcer Cleansing: Other: soap Ulcer Cleansing: Other: soap N/A and water and water Topical Anesthetic Applied: Topical Anesthetic Applied: Other: lidocaine 4% Other: lidocaine 4% Treatment Notes Electronic Signature(s) Signed: 02/16/2017 4:58:58 PM By: Montey Hora Entered By: Montey Hora on 02/16/2017 10:53:21 [name] ([id number]) -------------------------------------------------------------------------------- [place] Details Patient Name: [name], Jillian B. Date of Service: 02/16/2017 10:15 AM Medical Record Number: [id number] Patient Account Number: [id number] Date of Birth/Sex: 18-Mar-1945 (72 y.o. Female) Treating RN: Montey Hora Primary Care Trip Cavanagh: Lelon Huh Other Clinician: Referring Amma Crear: Lelon Huh Treating Krzysztof Reichelt/Extender: Melburn Hake, HOYT Weeks in Treatment: 14 Active Inactive ` Abuse / Safety / Falls / Self Care Management Nursing Diagnoses: History of Falls Potential for falls Goals: Patient will not experience any injury related to falls Date Initiated: 10/02/2016 Target Resolution Date: 01/06/2017 Goal  Status: Active Interventions: Assess: immobility, friction, shearing, incontinence upon admission and as  needed Assess impairment of mobility on admission and as needed per policy Assess personal safety and home safety (as indicated) on admission and as needed Notes: ` Nutrition Nursing Diagnoses: Imbalanced nutrition Impaired glucose control: actual or potential Potential for alteratiion in Nutrition/Potential for imbalanced nutrition Goals: Patient/caregiver agrees to and verbalizes understanding of need to use nutritional supplements and/or vitamins as prescribed Date Initiated: 10/02/2016 Target Resolution Date: 02/10/2017 Goal Status: Active Patient/caregiver will maintain therapeutic glucose control Date Initiated: 10/02/2016 Target Resolution Date: 01/06/2017 Goal Status: Active Interventions: Assess patient nutrition upon admission and as needed per policy Provide education on elevated blood sugars and impact on wound healing Provide education on nutrition Treatment Activities: Education provided on Nutrition : 10/02/2016 KIANDRA, SANGUINETTI ([id number]) Notes: ` Orientation to the Wound Care Program Nursing Diagnoses: Knowledge deficit related to the wound healing center program Goals: Patient/caregiver will verbalize understanding of the [place] Date Initiated: 10/02/2016 Target Resolution Date: 11/11/2016 Goal Status: Active Interventions: Provide education on orientation to the wound center Notes: ` Pain, Acute or Chronic Nursing Diagnoses: Pain, acute or chronic: actual or potential Potential alteration in comfort, pain Goals: Patient/caregiver will verbalize adequate pain control between visits Date Initiated: 10/02/2016 Target Resolution Date: 02/10/2017 Goal Status: Active Interventions: Complete pain assessment as per visit requirements Notes: ` Wound/Skin Impairment Nursing Diagnoses: Impaired tissue integrity Knowledge deficit related to ulceration/compromised skin integrity Goals: Ulcer/skin breakdown will have a volume reduction of 80%  by week 12 Date Initiated: 10/02/2016 Target Resolution Date: 02/03/2017 Goal Status: Active Interventions: Assess patient/caregiver ability to perform ulcer/skin care regimen upon admission and as needed Assess ulceration(s) every visit Notes: [name], [name] ([id number]) Electronic Signature(s) Signed: 02/16/2017 4:58:58 PM By: Montey Hora Entered By: Montey Hora on 02/16/2017 10:45:28 [name] ([id number]) -------------------------------------------------------------------------------- Pain Assessment Details Patient Name: Jillian Albe B. Date of Service: 02/16/2017 10:15 AM Medical Record Number: [id number] Patient Account Number: [id number] Date of Birth/Sex: 03-12-1945 (72 y.o. Female) Treating RN: Montey Hora Primary Care Terris Bodin: Lelon Huh Other Clinician: Referring Anberlin Diez: Lelon Huh Treating Orrie Lascano/Extender: Melburn Hake, HOYT Weeks in Treatment: 18 Active Problems Location of Pain Severity and Description of Pain Patient Has Paino Yes Site Locations Pain Location: Generalized Pain With Dressing Change: Yes Duration of the Pain. Constant / Intermittento Constant Pain Management and Medication Current Pain Management: Notes Topical or injectable lidocaine is offered to patient for acute pain when surgical debridement is performed. If needed, Patient is instructed to use over the counter pain medication for the following 24-48 hours after debridement. Wound care MDs do not prescribed pain medications. Patient has chronic pain or uncontrolled pain. Patient has been instructed to make an appointment with their Primary Care Physician for pain management. Electronic Signature(s) Signed: 02/16/2017 4:58:58 PM By: Montey Hora Entered By: Montey Hora on 02/16/2017 10:28:49 [name] ([id number]) -------------------------------------------------------------------------------- Patient/Caregiver Education Details Patient Name: [name] Date of Service: 02/16/2017 10:15 AM Medical Record Number: [id number] Patient Account Number: [id number] Date of Birth/Gender: 1945-02-14 (72 y.o. Female) Treating RN: Montey Hora Primary Care Physician: Lelon Huh Other Clinician: Referring Physician: Lelon Huh Treating Physician/Extender: Sharalyn Ink in Treatment: 39 Education Assessment Education Provided To: Patient and Caregiver Education Topics Provided Venous: Handouts: Other: continue wearing compression on right lower leg Methods: Explain/Verbal Responses: State content correctly Electronic Signature(s) Signed: 02/16/2017 4:58:58 PM By: Montey Hora Entered By: Montey Hora on 02/16/2017 10:56:10 [name], Jillian B. (  [id number]) -------------------------------------------------------------------------------- Wound Assessment Details Patient Name: [name], SFERRAZZA. Date of Service: 02/16/2017 10:15 AM Medical Record Number: [id number] Patient Account Number: [id number] Date of Birth/Sex: 1945-12-23 (72 y.o. Female) Treating RN: Montey Hora Primary Care [place] Kinyon: Lelon Huh Other Clinician: Referring Kemoni Ortega: Lelon Huh Treating Ulah Olmo/Extender: Melburn Hake, HOYT Weeks in Treatment: 19 Wound Status Wound Number: 1 Primary Diabetic Wound/Ulcer of the Lower Extremity Etiology: Wound Location: Left Lower Leg - Lateral Wound Open Wounding Event: Gradually Appeared Status: Date Acquired: 08/02/2016 Comorbid Asthma, Congestive Heart Failure, Coronary Weeks Of Treatment: 19 History: Artery Disease, Hypertension, Type II Diabetes, Clustered Wound: No Osteoarthritis Photos Photo Uploaded By: Montey Hora on 02/16/2017 11:50:44 Wound Measurements Length: (cm) 1.9 Width: (cm) 1.8 Depth: (cm) 0.1 Area: (cm) 2.686 Volume: (cm) 0.269 % Reduction in Area: 84.5% % Reduction in Volume: 84.4% Epithelialization: Small (1-33%) Tunneling: No Undermining: No Wound  Description Classification: Grade 2 Wound Margin: Flat and Intact Exudate Amount: Large Exudate Type: Serous Exudate Color: amber Foul Odor After Cleansing: No Slough/Fibrino No Wound Bed Granulation Amount: Large (67-100%) Exposed Structure Granulation Quality: Red Fascia Exposed: No Necrotic Amount: None Present (0%) Fat Layer (Subcutaneous Tissue) Exposed: Yes Tendon Exposed: No Muscle Exposed: No Joint Exposed: No Bone Exposed: No Periwound Skin Texture [name], Jillian B. ([id number]) Texture Color No Abnormalities Noted: No No Abnormalities Noted: No Callus: No Atrophie Blanche: No Crepitus: No Cyanosis: No Excoriation: No Ecchymosis: No Induration: No Erythema: No Rash: No Hemosiderin Staining: No Scarring: No Mottled: No Pallor: No Moisture Rubor: No No Abnormalities Noted: No Dry / Scaly: No Temperature / Pain Maceration: No Temperature: No Abnormality Tenderness on Palpation: Yes Wound Preparation Ulcer Cleansing: Other: soap and water, Topical Anesthetic Applied: Other: lidocaine 4%, Treatment Notes Wound #1 (Left, Lateral Lower Leg) 1. Cleansed with: Cleanse wound with antibacterial soap and water 2. Anesthetic Topical Lidocaine 4% cream to wound bed prior to debridement 3. Peri-wound Care: Moisturizing lotion 4. Dressing Applied: Hydrafera Blue 5. Secondary Dressing Applied ABD Pad 7. Secured with 3 Layer Compression System - Left Lower Extremity Electronic Signature(s) Signed: 02/16/2017 4:58:58 PM By: Montey Hora Entered By: Montey Hora on 02/16/2017 10:43:06 [name] ([id number]) -------------------------------------------------------------------------------- Wound Assessment Details Patient Name: [name], Jillian B. Date of Service: 02/16/2017 10:15 AM Medical Record Number: [id number] Patient Account Number: [id number] Date of Birth/Sex: 02/12/1945 (72 y.o. Female) Treating RN: Montey Hora Primary Care Burnice Oestreicher: Lelon Huh Other Clinician: Referring Davyn Morandi: Lelon Huh Treating Railee Bonillas/Extender: Melburn Hake, HOYT Weeks in Treatment: 19 Wound Status Wound Number: 11 Primary Diabetic Wound/Ulcer of the Lower Extremity Etiology: Wound Location: Left Lower Leg - Medial Wound Open Wounding Event: Gradually Appeared Status: Date Acquired: 01/26/2017 Comorbid Asthma, Congestive Heart Failure, Coronary Weeks Of Treatment: 3 History: Artery Disease, Hypertension, Type II Diabetes, Clustered Wound: Yes Osteoarthritis Photos Photo Uploaded By: Montey Hora on 02/16/2017 11:50:45 Wound Measurements Length: (cm) 0.1 Width: (cm) 0.1 Depth: (cm) 0.1 Area: (cm) 0.008 Volume: (cm) 0.001 % Reduction in Area: 99.9% % Reduction in Volume: 99.9% Epithelialization: Large (67-100%) Tunneling: No Undermining: No Wound Description Classification: Grade 2 Wound Margin: Flat and Intact Exudate Amount: Small Exudate Type: Serous Exudate Color: amber Foul Odor After Cleansing: No Slough/Fibrino Yes Wound Bed Granulation Amount: Large (67-100%) Exposed Structure Granulation Quality: Pink Fascia Exposed: No Necrotic Amount: None Present (0%) Fat Layer (Subcutaneous Tissue) Exposed: No Tendon Exposed: No Muscle Exposed: No Joint Exposed: No Bone Exposed: No Periwound Skin Texture [name], Jillian B. ([id number]) Texture Color No Abnormalities Noted: No No Abnormalities Noted:  No Callus: No Atrophie Blanche: No Crepitus: No Cyanosis: No Excoriation: No Ecchymosis: No Induration: No Erythema: No Rash: No Hemosiderin Staining: No Scarring: No Mottled: No Pallor: No Moisture Rubor: No No Abnormalities Noted: No Dry / Scaly: No Temperature / Pain Maceration: No Temperature: No Abnormality Tenderness on Palpation: Yes Wound Preparation Ulcer Cleansing: Other: soap and water, Topical Anesthetic Applied: Other: lidocaine 4%, Treatment Notes Wound #11 (Left, Medial Lower Leg) 1.  Cleansed with: Cleanse wound with antibacterial soap and water 2. Anesthetic Topical Lidocaine 4% cream to wound bed prior to debridement 3. Peri-wound Care: Moisturizing lotion 4. Dressing Applied: Hydrafera Blue 5. Secondary Dressing Applied ABD Pad 7. Secured with 3 Layer Compression System - Left Lower Extremity Electronic Signature(s) Signed: 02/16/2017 4:58:58 PM By: Montey Hora Entered By: Montey Hora on 02/16/2017 10:43:25 [name] ([id number]) -------------------------------------------------------------------------------- [place] Details Patient Name: Jillian Albe B. Date of Service: 02/16/2017 10:15 AM Medical Record Number: [id number] Patient Account Number: [id number] Date of Birth/Sex: 1945/04/20 (72 y.o. Female) Treating RN: Montey Hora Primary Care Lilit Cinelli: Lelon Huh Other Clinician: Referring Mareon Robinette: Lelon Huh Treating Caytlin Better/Extender: Melburn Hake, HOYT Weeks in Treatment: 75 Vital Signs Time Taken: 10:28 Temperature (F): 98.0 Height (in): 64 Pulse (bpm): 66 Weight (lbs): 209 Respiratory Rate (breaths/min): 18 Body Mass Index (BMI): 35.9 Blood Pressure (mmHg): 139/73 Reference Range: 80 - 120 mg / dl Electronic Signature(s) Signed: 02/16/2017 4:58:58 PM By: Montey Hora Entered By: Montey Hora on 02/16/2017 10:30:54

## 2017-02-21 DIAGNOSIS — Z7984 Long term (current) use of oral hypoglycemic drugs: Secondary | ICD-10-CM | POA: Diagnosis not present

## 2017-02-21 DIAGNOSIS — L97821 Non-pressure chronic ulcer of other part of left lower leg limited to breakdown of skin: Secondary | ICD-10-CM | POA: Diagnosis not present

## 2017-02-21 DIAGNOSIS — Z7951 Long term (current) use of inhaled steroids: Secondary | ICD-10-CM | POA: Diagnosis not present

## 2017-02-21 DIAGNOSIS — Z4800 Encounter for change or removal of nonsurgical wound dressing: Secondary | ICD-10-CM | POA: Diagnosis not present

## 2017-02-21 DIAGNOSIS — I110 Hypertensive heart disease with heart failure: Secondary | ICD-10-CM | POA: Diagnosis not present

## 2017-02-21 DIAGNOSIS — I272 Pulmonary hypertension, unspecified: Secondary | ICD-10-CM | POA: Diagnosis not present

## 2017-02-21 DIAGNOSIS — L97222 Non-pressure chronic ulcer of left calf with fat layer exposed: Secondary | ICD-10-CM | POA: Diagnosis not present

## 2017-02-21 DIAGNOSIS — M1990 Unspecified osteoarthritis, unspecified site: Secondary | ICD-10-CM | POA: Diagnosis not present

## 2017-02-21 DIAGNOSIS — J45909 Unspecified asthma, uncomplicated: Secondary | ICD-10-CM | POA: Diagnosis not present

## 2017-02-21 DIAGNOSIS — Z79891 Long term (current) use of opiate analgesic: Secondary | ICD-10-CM | POA: Diagnosis not present

## 2017-02-21 DIAGNOSIS — I429 Cardiomyopathy, unspecified: Secondary | ICD-10-CM | POA: Diagnosis not present

## 2017-02-21 DIAGNOSIS — I509 Heart failure, unspecified: Secondary | ICD-10-CM | POA: Diagnosis not present

## 2017-02-21 DIAGNOSIS — I2510 Atherosclerotic heart disease of native coronary artery without angina pectoris: Secondary | ICD-10-CM | POA: Diagnosis not present

## 2017-02-21 DIAGNOSIS — E11622 Type 2 diabetes mellitus with other skin ulcer: Secondary | ICD-10-CM | POA: Diagnosis not present

## 2017-02-21 NOTE — Progress Notes (Signed)
DELYNN, OLVERA ([id number]) Visit Report for 02/16/2017 Chief Complaint Document Details Patient Name: [name], [name]. Date of Service: 02/16/2017 10:15 AM Medical Record Number: [id number] Patient Account Number: [id number] Date of Birth/Sex: April 12, 1945 (72 y.o. Female) Treating RN: Montey Hora Primary Care Provider: Lelon Huh Other Clinician: Referring Provider: Lelon Huh Treating Provider/Extender: Melburn Hake, HOYT Weeks in Treatment: 7 Information Obtained from: Patient Chief Complaint Patient seen for complaints of Non-Healing Wounds to both lower extremities Electronic Signature(s) Signed: 02/19/2017 8:04:39 AM By: Worthy Keeler PA-C Entered By: Worthy Keeler on 02/16/2017 10:26:47 [name] ([id number]) -------------------------------------------------------------------------------- HPI Details Patient Name: [name], Jillian B. Date of Service: 02/16/2017 10:15 AM Medical Record Number: [id number] Patient Account Number: [id number] Date of Birth/Sex: 05/18/1945 (72 y.o. Female) Treating RN: Montey Hora Primary Care Provider: Lelon Huh Other Clinician: Referring Provider: Lelon Huh Treating Provider/Extender: Melburn Hake, HOYT Weeks in Treatment: 36 History of Present Illness Location: both lower extremity swelling with ulceration HPI Description: 72 year old patient who sees her PCP Dr. Lelon Huh was recently evaluated 10 days ago for diabetes mellitus, hypertension, CHF and hyperlipidemia. she also was noted to have ulcerations develop in her legs and she has been applying Silvadene dressings locally. In the past she has refused wound care referrals.her cardiologist Dr. Saunders Revel saw her and put her on 40 mg of furosemide daily. last hemoglobin A1c was 7.7%. she was also placed on ciprofloxacin twice daily for 7 days and a urine culture was recommended. past medical history significant for coronary artery disease, diabetes mellitus, nonischemic  cardiomyopathy and pulmonary hypertension. She is also status post heart catheterization and coronary angiography, tubal ligation and breast cyst removal in the past. She has never been a smoker. Patient had arterial studies done which showed bilateral ABIs are artificially elevated due to noncompressible and calcified vessels. Triphasic waveform throughout. Right great toe TBI is elevated while the left is normal. 10/23/2016 -- the patient is rather moribund from several issues including chronic back pain and knee pain and swelling of her legs. The large necrotic area on her left lateral anterior calf was bleeding on touch after washing her leg. There was a spot which needed silver nitrate cauterization and this was done appropriately. 11/06/2016 -- she is again noted to have friable bleeding from the left lower extremity wounds and this again had to be cauterized with silver nitrate to control the bleeding as pressure itself would not do it. 11/20/2016 -- the right lower extremity is completely healed and we have ordered 30-40 mm compression stocking's in both the dural layer and also a pair of juxta lites. 12/18/2016 -- she has not been wearing her compression stockings on her right leg and these have opened out into ulcerations again. Her left lower extremity has circumferential ulcerations now. I believe at this stage I would like to get her venous reflux studies done to make certain that there is no fixable superficial venous reflux. 12/29/2016 -- she has made an excellent recovery having continued with her appropriate doses of diuretics and elevation and exercise. She has not yet received her right lower extremity juxta lites. Her venous duplex study is at the end of January 01/13/16 on evaluation today patient's wounds appeared to be overall doing much better much less hyper granular than previous week's evaluation. The Hydrofera Blue Dressing's to be doing very well. She does tell me that  she is having a little bit more discomfort in the posterior aspect of her leg where she has a wound  at this point. Fortunately there appears to be no infection. 01/19/17 on evaluation today patient appears to be doing very well in regard to her bilateral lower extremity swelling and at this point in time her left lower extremity ulcers. Her wounds appear to be doing much better. She has been tolerating the dressing's at this point fortunately she did get the Juxta-Lite compression as of today as well. Nonetheless I am pleased that she has been tolerating everything so well and that her wounds looks so good. In fact she is an excellent granular surface no evidence of slough covering and I do not see any reason for likely debridement today. Especially in regard to the posterior leg. 01/26/17 on evaluation today patient appears to be doing decently well in regard to her wounds. The surface of the majority of her wounds is greatly improved. She has been using the Juxta-Lite compression which is excellent. Nonetheless the wound of the left posterior calf did require some debridement today otherwise the majority of the wounds did not require any debridement. The this was due to slough buildup on the surface. She also has a small skin flap where two of the ulcers actually connected and this has loosened up I'm concerned that things may not heal well with that flap but for the time being I'm not gonna do anything different in that regard. [name], [name] ([id number]) 02/16/17 on evaluation today patient appears to be doing excellent in regard to her left lower extremity ulcers. She has been tolerating the dressing changes without complication. At this point the medial ulcer has completely resolved and appears to be close with no evidence of residual opening. She is not having any discomfort at this time. In regard to the lateral ulcers this likewise is doing very well at this point there does not appear to be  any evidence of infection and there is no slough noted that requires debridement today. Overall she has progressed extremely nicely with the [facility] Dressing. Electronic Signature(s) Signed: 02/19/2017 8:04:39 AM By: Worthy Keeler PA-C Entered By: Worthy Keeler on 02/16/2017 17:12:16 [name] ([id number]) -------------------------------------------------------------------------------- Physical Exam Details Patient Name: [name], Jillian B. Date of Service: 02/16/2017 10:15 AM Medical Record Number: [id number] Patient Account Number: [id number] Date of Birth/Sex: 10-May-1945 (72 y.o. Female) Treating RN: Montey Hora Primary Care Provider: Lelon Huh Other Clinician: Referring Provider: Lelon Huh Treating Provider/Extender: Melburn Hake, HOYT Weeks in Treatment: 22 Constitutional Well-nourished and well-hydrated in no acute distress. Respiratory normal breathing without difficulty. clear to auscultation bilaterally. Cardiovascular regular rate and rhythm with normal S1, S2. 1+ pitting edema of the bilateral lower extremities. Psychiatric this patient is able to make decisions and demonstrates good insight into disease process. Alert and Oriented x 3. pleasant and cooperative. Notes Patient's wound bed appears to show evidence of great granulation of the surface immediately and again the lateral wound has completely healed. No sharp debridement was required. We will continue with the Current wound care measures in this regard. Electronic Signature(s) Signed: 02/19/2017 8:04:39 AM By: Worthy Keeler PA-C Entered By: Worthy Keeler on 02/16/2017 17:13:16 [name] ([id number]) -------------------------------------------------------------------------------- Physician Orders Details Patient Name: [name] Date of Service: 02/16/2017 10:15 AM Medical Record Number: [id number] Patient Account Number: [id number] Date of Birth/Sex: 04-09-1945 (72 y.o.  Female) Treating RN: Montey Hora Primary Care Provider: Lelon Huh Other Clinician: Referring Provider: Lelon Huh Treating Provider/Extender: Melburn Hake, HOYT Weeks in Treatment: 1 Verbal / Phone Orders: No Diagnosis Coding  ICD-10 Coding Code Description E11.622 Type 2 diabetes mellitus with other skin ulcer I89.0 Lymphedema, not elsewhere classified L97.212 Non-pressure chronic ulcer of right calf with fat layer exposed L97.222 Non-pressure chronic ulcer of left calf with fat layer exposed Wound Cleansing Wound #1 Left,Lateral Lower Leg o Clean wound with Normal Saline. o Cleanse wound with mild soap and water o May Shower, gently pat wound dry prior to applying new dressing. Wound #11 Left,Medial Lower Leg o Clean wound with Normal Saline. o Cleanse wound with mild soap and water o May Shower, gently pat wound dry prior to applying new dressing. Anesthetic (add to Medication List) Wound #1 Left,Lateral Lower Leg o Topical Lidocaine 4% cream applied to wound bed prior to debridement (In Clinic Only). Wound #11 Left,Medial Lower Leg o Topical Lidocaine 4% cream applied to wound bed prior to debridement (In Clinic Only). Primary Wound Dressing Wound #1 Left,Lateral Lower Leg o Hydrafera Blue - Please make sure this is Hydrafera Blue Ready Transfer Wound #11 Left,Medial Lower Leg o Hydrafera Blue - Please make sure this is Materials engineer Secondary Dressing Wound #1 Left,Lateral Lower Leg o ABD pad Wound #11 Left,Medial Lower Leg o ABD pad Dressing Change Frequency Wound #1 Left,Lateral Lower Leg [name], [name] ([id number]) o Change Dressing Monday, Wednesday, Friday - HHRN to change Monday, Wednesday and Fridays and pt to come to [facility] on Fridays. Wound #11 Left,Medial Lower Leg o Change Dressing Monday, Wednesday, Friday - HHRN to change Monday, Wednesday and Fridays and pt to come to [place] on  Fridays. Follow-up Appointments Wound #1 Left,Lateral Lower Leg o Return Appointment in 1 week. Wound #11 Left,Medial Lower Leg o Return Appointment in 1 week. Edema Control Wound #1 Left,Lateral Lower Leg o 3 Layer Compression System - Left Lower Extremity - ****Make sure you are only putting a 3-layer wrap on the left leg**** wrap up to 3 finger widths below the knee. o Patient to wear own Juxtalite/Juzo compression garment. - Please educate patient and spouse about juxtalite application and use o Elevate legs to the level of the heart and pump ankles as often as possible Additional Orders / Instructions Wound #1 Left,Lateral Lower Leg o Vitamin A; Vitamin C, Zinc o Increase protein intake. Wound #11 Left,Medial Lower Leg o Vitamin A; Vitamin C, Zinc o Increase protein intake. Home Health Wound #1 Left,Lateral Lower Leg o Continue Home Health Visits - Please educate patient and spouse about juxtalite application and use o Home Health Nurse may visit PRN to address patientos wound care needs. o FACE TO FACE ENCOUNTER: MEDICARE and MEDICAID PATIENTS: I certify that this patient is under my care and that I had a face-to-face encounter that meets the physician face-to-face encounter requirements with this patient on this date. The encounter with the patient was in whole or in part for the following MEDICAL CONDITION: (primary reason for [place]) MEDICAL NECESSITY: I certify, that based on my findings, NURSING services are a medically necessary home health service. HOME BOUND STATUS: I certify that my clinical findings support that this patient is homebound (i.e., Due to illness or injury, pt requires aid of supportive devices such as crutches, cane, wheelchairs, walkers, the use of special transportation or the assistance of another person to leave their place of residence. There is a normal inability to leave the home and doing so requires considerable  and taxing effort. Other absences are for medical reasons / religious services and are infrequent or of short  duration when for other reasons). o If current dressing causes regression in wound condition, may D/C ordered dressing product/s and apply Normal Saline Moist Dressing daily until next [place] / Other MD appointment. [place] of regression in wound condition at [phone]. o Please direct any NON-WOUND related issues/requests for orders to patient's Primary Care Physician Wound #11 Left,Medial Lower Leg o [place] Visits - Please educate patient and spouse about juxtalite application and use o Home Health Nurse may visit PRN to address patientos wound care needs. o FACE TO FACE ENCOUNTER: MEDICARE and MEDICAID PATIENTS: I certify that this patient is under my care and that I had a face-to-face encounter that meets the physician face-to-face encounter requirements with this patient on this date. The encounter with the patient was in whole or in part for the [place] ([id number]) CONDITION: (primary reason for [place]) MEDICAL NECESSITY: I certify, that based on my findings, NURSING services are a medically necessary home health service. HOME BOUND STATUS: I certify that my clinical findings support that this patient is homebound (i.e., Due to illness or injury, pt requires aid of supportive devices such as crutches, cane, wheelchairs, walkers, the use of special transportation or the assistance of another person to leave their place of residence. There is a normal inability to leave the home and doing so requires considerable and taxing effort. Other absences are for medical reasons / religious services and are infrequent or of short duration when for other reasons). o If current dressing causes regression in wound condition, may D/C ordered dressing product/s and apply Normal Saline Moist Dressing  daily until next [place] / Other MD appointment. [place] of regression in wound condition at [phone]. o Please direct any NON-WOUND related issues/requests for orders to patient's Primary Care Physician Electronic Signature(s) Signed: 02/16/2017 4:58:58 PM By: Montey Hora Signed: 02/19/2017 8:04:39 AM By: Worthy Keeler PA-C Entered By: Montey Hora on 02/16/2017 10:54:08 [name] ([id number]) -------------------------------------------------------------------------------- Problem List Details Patient Name: [name], Jillian B. Date of Service: 02/16/2017 10:15 AM Medical Record Number: [id number] Patient Account Number: [id number] Date of Birth/Sex: 08/30/1945 (72 y.o. Female) Treating RN: Montey Hora Primary Care Provider: Lelon Huh Other Clinician: Referring Provider: Lelon Huh Treating Provider/Extender: Melburn Hake, HOYT Weeks in Treatment: 72 Active Problems ICD-10 Encounter Code Description Active Date Diagnosis E11.622 Type 2 diabetes mellitus with other skin ulcer 10/02/2016 Yes I89.0 Lymphedema, not elsewhere classified 10/02/2016 Yes L97.212 Non-pressure chronic ulcer of right calf with fat layer exposed 10/02/2016 Yes L97.222 Non-pressure chronic ulcer of left calf with fat layer exposed 10/02/2016 Yes Inactive Problems Resolved Problems Electronic Signature(s) Signed: 02/19/2017 8:04:39 AM By: Worthy Keeler PA-C Entered By: Worthy Keeler on 02/16/2017 10:26:38 [name] ([id number]) -------------------------------------------------------------------------------- Progress Note Details Patient Name: Jillian Albe B. Date of Service: 02/16/2017 10:15 AM Medical Record Number: [id number] Patient Account Number: [id number] Date of Birth/Sex: 12-26-1945 (72 y.o. Female) Treating RN: Montey Hora Primary Care Provider: Lelon Huh Other Clinician: Referring Provider: Lelon Huh Treating Provider/Extender:  Melburn Hake, HOYT Weeks in Treatment: 45 Subjective Chief Complaint Information obtained from Patient Patient seen for complaints of Non-Healing Wounds to both lower extremities History of Present Illness (HPI) The following HPI elements were documented for the patient's wound: Location: both lower extremity swelling with ulceration 72 year old patient who sees her PCP Dr. Lelon Huh was recently evaluated 10 days ago for diabetes mellitus, hypertension, CHF and hyperlipidemia. she also  was noted to have ulcerations develop in her legs and she has been applying Silvadene dressings locally. In the past she has refused wound care referrals.her cardiologist Dr. Saunders Revel saw her and put her on 40 mg of furosemide daily. last hemoglobin A1c was 7.7%. she was also placed on ciprofloxacin twice daily for 7 days and a urine culture was recommended. past medical history significant for coronary artery disease, diabetes mellitus, nonischemic cardiomyopathy and pulmonary hypertension. She is also status post heart catheterization and coronary angiography, tubal ligation and breast cyst removal in the past. She has never been a smoker. Patient had arterial studies done which showed bilateral ABIs are artificially elevated due to noncompressible and calcified vessels. Triphasic waveform throughout. Right great toe TBI is elevated while the left is normal. 10/23/2016 -- the patient is rather moribund from several issues including chronic back pain and knee pain and swelling of her legs. The large necrotic area on her left lateral anterior calf was bleeding on touch after washing her leg. There was a spot which needed silver nitrate cauterization and this was done appropriately. 11/06/2016 -- she is again noted to have friable bleeding from the left lower extremity wounds and this again had to be cauterized with silver nitrate to control the bleeding as pressure itself would not do it. 11/20/2016 -- the right  lower extremity is completely healed and we have ordered 30-40 mm compression stocking's in both the dural layer and also a pair of juxta lites. 12/18/2016 -- she has not been wearing her compression stockings on her right leg and these have opened out into ulcerations again. Her left lower extremity has circumferential ulcerations now. I believe at this stage I would like to get her venous reflux studies done to make certain that there is no fixable superficial venous reflux. 12/29/2016 -- she has made an excellent recovery having continued with her appropriate doses of diuretics and elevation and exercise. She has not yet received her right lower extremity juxta lites. Her venous duplex study is at the end of January 01/13/16 on evaluation today patient's wounds appeared to be overall doing much better much less hyper granular than previous week's evaluation. The Hydrofera Blue Dressing's to be doing very well. She does tell me that she is having a little bit more discomfort in the posterior aspect of her leg where she has a wound at this point. Fortunately there appears to be no infection. 01/19/17 on evaluation today patient appears to be doing very well in regard to her bilateral lower extremity swelling and at this point in time her left lower extremity ulcers. Her wounds appear to be doing much better. She has been tolerating the dressing's at this point fortunately she did get the Juxta-Lite compression as of today as well. Nonetheless I am pleased that she has been tolerating everything so well and that her wounds looks so good. In fact she is an excellent granular surface no evidence of slough covering and I do not see any reason for likely debridement today. Especially in regard to the posterior Potter, Alexandre B. ([id number]) leg. 01/26/17 on evaluation today patient appears to be doing decently well in regard to her wounds. The surface of the majority of her wounds is greatly improved. She  has been using the Juxta-Lite compression which is excellent. Nonetheless the wound of the left posterior calf did require some debridement today otherwise the majority of the wounds did not require any debridement. The this was due to slough buildup on  the surface. She also has a small skin flap where two of the ulcers actually connected and this has loosened up I'm concerned that things may not heal well with that flap but for the time being I'm not gonna do anything different in that regard. 02/16/17 on evaluation today patient appears to be doing excellent in regard to her left lower extremity ulcers. She has been tolerating the dressing changes without complication. At this point the medial ulcer has completely resolved and appears to be close with no evidence of residual opening. She is not having any discomfort at this time. In regard to the lateral ulcers this likewise is doing very well at this point there does not appear to be any evidence of infection and there is no slough noted that requires debridement today. Overall she has progressed extremely nicely with the [facility] Dressing. Patient History Information obtained from Patient. Family History Cancer - Mother,Siblings, Diabetes - Mother,Siblings, Heart Disease - Mother, Hypertension - Siblings, No family history of Hereditary Spherocytosis, Kidney Disease, Lung Disease, Seizures, Stroke, Thyroid Problems, Tuberculosis. Social History Never smoker, Marital Status - Married, Alcohol Use - Never, Drug Use - No History, Caffeine Use - Daily. Review of Systems (ROS) Constitutional Symptoms (General Health) Denies complaints or symptoms of Fever, Chills. Respiratory The patient has no complaints or symptoms. Cardiovascular Complains or has symptoms of LE edema. Psychiatric The patient has no complaints or symptoms. Objective Constitutional Well-nourished and well-hydrated in no acute distress. Vitals Time Taken: 10:28 AM,  Height: 64 in, Weight: 209 lbs, BMI: 35.9, Temperature: 98.0 F, Pulse: 66 bpm, Respiratory Rate: 18 breaths/min, Blood Pressure: 139/73 mmHg. Respiratory normal breathing without difficulty. clear to auscultation bilaterally. Cardiovascular regular rate and rhythm with normal S1, S2. 1+ pitting edema of the bilateral lower extremities. [name], [name] ([id number]) Psychiatric this patient is able to make decisions and demonstrates good insight into disease process. Alert and Oriented x 3. pleasant and cooperative. General Notes: Patient's wound bed appears to show evidence of great granulation of the surface immediately and again the lateral wound has completely healed. No sharp debridement was required. We will continue with the Current wound care measures in this regard. Integumentary (Hair, Skin) Wound #1 status is Open. Original cause of wound was Gradually Appeared. The wound is located on the Left,Lateral Lower Leg. The wound measures 1.9cm length x 1.8cm width x 0.1cm depth; 2.686cm^2 area and 0.269cm^3 volume. There is Fat Layer (Subcutaneous Tissue) Exposed exposed. There is no tunneling or undermining noted. There is a large amount of serous drainage noted. The wound margin is flat and intact. There is large (67-100%) red granulation within the wound bed. There is no necrotic tissue within the wound bed. The periwound skin appearance did not exhibit: Callus, Crepitus, Excoriation, Induration, Rash, Scarring, Dry/Scaly, Maceration, Atrophie Blanche, Cyanosis, Ecchymosis, Hemosiderin Staining, Mottled, Pallor, Rubor, Erythema. Periwound temperature was noted as No Abnormality. The periwound has tenderness on palpation. Wound #11 status is Open. Original cause of wound was Gradually Appeared. The wound is located on the Left,Medial Lower Leg. The wound measures 0.1cm length x 0.1cm width x 0.1cm depth; 0.008cm^2 area and 0.001cm^3 volume. There is no tunneling or undermining noted.  There is a small amount of serous drainage noted. The wound margin is flat and intact. There is large (67-100%) pink granulation within the wound bed. There is no necrotic tissue within the wound bed. The periwound skin appearance did not exhibit: Callus, Crepitus, Excoriation, Induration, Rash, Scarring, Dry/Scaly, Maceration, Atrophie Blanche,  Cyanosis, Ecchymosis, Hemosiderin Staining, Mottled, Pallor, Rubor, Erythema. Periwound temperature was noted as No Abnormality. The periwound has tenderness on palpation. Assessment Active Problems ICD-10 E11.622 - Type 2 diabetes mellitus with other skin ulcer I89.0 - Lymphedema, not elsewhere classified L97.212 - Non-pressure chronic ulcer of right calf with fat layer exposed L97.222 - Non-pressure chronic ulcer of left calf with fat layer exposed Procedures Wound #1 Pre-procedure diagnosis of Wound #1 is a Diabetic Wound/Ulcer of the Lower Extremity located on the Left,Lateral Lower Leg . There was a Three Layer Compression Therapy Procedure by Montey Hora, RN. Post procedure Diagnosis Wound #1: Same as Pre-Procedure Wound #11 Pre-procedure diagnosis of Wound #11 is a Diabetic Wound/Ulcer of the Lower Extremity located on the Left,Medial Lower Leg . There was a Three Layer Compression Therapy Procedure by Montey Hora, RN. Post procedure Diagnosis Wound #11: Same as Pre-Procedure [name], [name] ([id number]) Plan Wound Cleansing: Wound #1 Left,Lateral Lower Leg: Clean wound with Normal Saline. Cleanse wound with mild soap and water May Shower, gently pat wound dry prior to applying new dressing. Wound #11 Left,Medial Lower Leg: Clean wound with Normal Saline. Cleanse wound with mild soap and water May Shower, gently pat wound dry prior to applying new dressing. Anesthetic (add to Medication List): Wound #1 Left,Lateral Lower Leg: Topical Lidocaine 4% cream applied to wound bed prior to debridement (In Clinic Only). Wound #11  Left,Medial Lower Leg: Topical Lidocaine 4% cream applied to wound bed prior to debridement (In Clinic Only). Primary Wound Dressing: Wound #1 Left,Lateral Lower Leg: Hydrafera Blue - Please make sure this is Hydrafera Blue Ready Transfer Wound #11 Left,Medial Lower Leg: Hydrafera Blue - Please make sure this is Hydrafera Blue Ready Transfer Secondary Dressing: Wound #1 Left,Lateral Lower Leg: ABD pad Wound #11 Left,Medial Lower Leg: ABD pad Dressing Change Frequency: Wound #1 Left,Lateral Lower Leg: Change Dressing Monday, Wednesday, Friday - HHRN to change Monday, Wednesday and Fridays and pt to come to [place] on Fridays. Wound #11 Left,Medial Lower Leg: Change Dressing Monday, Wednesday, Friday - HHRN to change Monday, Wednesday and Fridays and pt to come to [facility] on Fridays. Follow-up Appointments: Wound #1 Left,Lateral Lower Leg: Return Appointment in 1 week. Wound #11 Left,Medial Lower Leg: Return Appointment in 1 week. Edema Control: Wound #1 Left,Lateral Lower Leg: 3 Layer Compression System - Left Lower Extremity - ****Make sure you are only putting a 3-layer wrap on the left leg**** wrap up to 3 finger widths below the knee. Patient to wear own Juxtalite/Juzo compression garment. - Please educate patient and spouse about juxtalite application and use Elevate legs to the level of the heart and pump ankles as often as possible Additional Orders / Instructions: Wound #1 Left,Lateral Lower Leg: Vitamin A; Vitamin C, Zinc Increase protein intake. Wound #11 Left,Medial Lower Leg: Vitamin A; Vitamin C, Zinc Increase protein intake. Home Health: [name], [name] ([id number]) Wound #1 Left,Lateral Lower Leg: [place] Visits - Please educate patient and spouse about juxtalite application and use Home Health Nurse may visit PRN to address patient s wound care needs. FACE TO FACE ENCOUNTER: MEDICARE and MEDICAID PATIENTS: I certify that this  patient is under my care and that I had a face-to-face encounter that meets the physician face-to-face encounter requirements with this patient on this date. The encounter with the patient was in whole or in part for the following MEDICAL CONDITION: (primary reason for [place]) MEDICAL NECESSITY: I certify, that based on my findings, NURSING  services are a medically necessary home health service. HOME BOUND STATUS: I certify that my clinical findings support that this patient is homebound (i.e., Due to illness or injury, pt requires aid of supportive devices such as crutches, cane, wheelchairs, walkers, the use of special transportation or the assistance of another person to leave their place of residence. There is a normal inability to leave the home and doing so requires considerable and taxing effort. Other absences are for medical reasons / religious services and are infrequent or of short duration when for other reasons). If current dressing causes regression in wound condition, may D/C ordered dressing product/s and apply Normal Saline Moist Dressing daily until next [place] / Other MD appointment. [place] of regression in wound condition at [phone]. Please direct any NON-WOUND related issues/requests for orders to patient's Primary Care Physician Wound #11 Left,Medial Lower Leg: [place] Visits - Please educate patient and spouse about juxtalite application and use Home Health Nurse may visit PRN to address patient s wound care needs. FACE TO FACE ENCOUNTER: MEDICARE and MEDICAID PATIENTS: I certify that this patient is under my care and that I had a face-to-face encounter that meets the physician face-to-face encounter requirements with this patient on this date. The encounter with the patient was in whole or in part for the following MEDICAL CONDITION: (primary reason for [place]) MEDICAL NECESSITY: I certify, that based on  my findings, NURSING services are a medically necessary home health service. HOME BOUND STATUS: I certify that my clinical findings support that this patient is homebound (i.e., Due to illness or injury, pt requires aid of supportive devices such as crutches, cane, wheelchairs, walkers, the use of special transportation or the assistance of another person to leave their place of residence. There is a normal inability to leave the home and doing so requires considerable and taxing effort. Other absences are for medical reasons / religious services and are infrequent or of short duration when for other reasons). If current dressing causes regression in wound condition, may D/C ordered dressing product/s and apply Normal Saline Moist Dressing daily until next [place] / Other MD appointment. [place] of regression in wound condition at [phone]. Please direct any NON-WOUND related issues/requests for orders to patient's Primary Care Physician I am not going to make any change to the current orders that she is doing so well we will continue with a Hydrofera Blue Dressing for the last remaining open ulcer on the lateral left lower extremity. We will see were things stand in one weeks time. She's in agreement with the plan. Please see above for specific wound care orders. We will see patient for re-evaluation in 1 week(s) here in the clinic. If anything worsens or changes patient will contact our office for additional recommendations. Electronic Signature(s) Signed: 02/19/2017 8:04:39 AM By: Worthy Keeler PA-C Entered By: Worthy Keeler on 02/16/2017 17:13:43 [name] ([id number]) -------------------------------------------------------------------------------- ROS/PFSH Details Patient Name: [name] Date of Service: 02/16/2017 10:15 AM Medical Record Number: [id number] Patient Account Number: [id number] Date of Birth/Sex: 1945/08/10 (72 y.o.  Female) Treating RN: Montey Hora Primary Care Provider: Lelon Huh Other Clinician: Referring Provider: Lelon Huh Treating Provider/Extender: Melburn Hake, HOYT Weeks in Treatment: 25 Information Obtained From Patient Wound History Do you currently have one or more open woundso Yes How many open wounds do you currently haveo 6 Approximately how long have you had your woundso 2 months How have  you been treating your wound(s) until nowo [place] Has your wound(s) ever healed and then re-openedo No Have you had any lab work done in the past montho No Have you tested positive for an antibiotic resistant organism (MRSA, VRE)o No Have you tested positive for osteomyelitis (bone infection)o No Have you had any tests for circulation on your legso Yes Where was the test [facility] Have you had other problems associated with your woundso Infection, Swelling Constitutional Symptoms (General Health) Complaints and Symptoms: Negative for: Fever; Chills Cardiovascular Complaints and Symptoms: Positive for: LE edema Medical History: Positive for: Congestive Heart Failure; Coronary Artery Disease; Hypertension Respiratory Complaints and Symptoms: No Complaints or Symptoms Medical History: Positive for: Asthma Endocrine Medical History: Positive for: Type II Diabetes Time with diabetes: a long time Treated with: Oral agents Blood sugar tested every day: No Musculoskeletal [name], [name] ([id number]) Medical History: Positive for: Osteoarthritis Psychiatric Complaints and Symptoms: No Complaints or Symptoms Immunizations Pneumococcal Vaccine: Received Pneumococcal Vaccination: Yes Implantable Devices Family and Social History Cancer: Yes - Mother,Siblings; Diabetes: Yes - Mother,Siblings; Heart Disease: Yes - Mother; Hereditary Spherocytosis: No; Hypertension: Yes - Siblings; Kidney Disease: No; Lung Disease: No; Seizures: No; Stroke: No; Thyroid Problems:  No; Tuberculosis: No; Never smoker; Marital Status - Married; Alcohol Use: Never; Drug Use: No History; Caffeine Use: Daily; Financial Concerns: No; Food, Clothing or Shelter Needs: No; Support System Lacking: No; Transportation Concerns: No; Advanced Directives: No; Patient does not want information on Advanced Directives; Do not resuscitate: No; Living Will: No; Medical Power of Attorney: No Physician Affirmation I have reviewed and agree with the above information. Electronic Signature(s) Signed: 02/19/2017 8:04:39 AM By: Worthy Keeler PA-C Signed: 02/20/2017 4:30:06 PM By: Montey Hora Entered By: Worthy Keeler on 02/16/2017 17:12:35 [name] ([id number]) -------------------------------------------------------------------------------- SuperBill Details Patient Name: JAQUILA, SANTELLI B. Date of Service: 02/16/2017 Medical Record Number: [id number] Patient Account Number: [id number] Date of Birth/Sex: 1945-07-11 (72 y.o. 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 Facility Procedures CPT4 Code: 28315176 Description: (Facility Use Only) [phone] - [place] LWR LT LEG Modifier: Quantity: 1 Physician Procedures CPT4 Code: 0626948 Description: 54627 - WC PHYS LEVEL 3 - EST PT ICD-10 Diagnosis Description E11.622 Type 2 diabetes mellitus with other skin ulcer I89.0 Lymphedema, not elsewhere classified L97.212 Non-pressure chronic ulcer of right calf with fat layer exp L97.222  Non-pressure chronic ulcer of left calf with fat layer  expo Modifier: osed sed Quantity: 1 Electronic Signature(s) Signed: 02/19/2017 8:04:39 AM By: Worthy Keeler PA-C Previous Signature: 02/16/2017 4:58:58 PM Version By: Montey Hora Entered By: Worthy Keeler on 02/16/2017 17:14:04

## 2017-02-22 ENCOUNTER — Other Ambulatory Visit: Payer: Self-pay | Admitting: *Deleted

## 2017-02-22 MED ORDER — METOPROLOL TARTRATE 50 MG PO TABS: 50.0000 mg | ORAL_TABLET | Freq: Two times a day (BID) | ORAL | 3 refills | Status: DC

## 2017-02-23 ENCOUNTER — Encounter: Payer: Medicare Other | Admitting: Physician Assistant

## 2017-02-23 DIAGNOSIS — I429 Cardiomyopathy, unspecified: Secondary | ICD-10-CM | POA: Diagnosis not present

## 2017-02-23 DIAGNOSIS — L97212 Non-pressure chronic ulcer of right calf with fat layer exposed: Secondary | ICD-10-CM | POA: Diagnosis not present

## 2017-02-23 DIAGNOSIS — L97322 Non-pressure chronic ulcer of left ankle with fat layer exposed: Secondary | ICD-10-CM | POA: Diagnosis not present

## 2017-02-23 DIAGNOSIS — I2510 Atherosclerotic heart disease of native coronary artery without angina pectoris: Secondary | ICD-10-CM | POA: Diagnosis not present

## 2017-02-23 DIAGNOSIS — L97222 Non-pressure chronic ulcer of left calf with fat layer exposed: Secondary | ICD-10-CM | POA: Diagnosis not present

## 2017-02-23 DIAGNOSIS — J45909 Unspecified asthma, uncomplicated: Secondary | ICD-10-CM | POA: Diagnosis not present

## 2017-02-23 DIAGNOSIS — I272 Pulmonary hypertension, unspecified: Secondary | ICD-10-CM | POA: Diagnosis not present

## 2017-02-23 DIAGNOSIS — Z7984 Long term (current) use of oral hypoglycemic drugs: Secondary | ICD-10-CM | POA: Diagnosis not present

## 2017-02-23 DIAGNOSIS — I110 Hypertensive heart disease with heart failure: Secondary | ICD-10-CM | POA: Diagnosis not present

## 2017-02-23 DIAGNOSIS — Z7982 Long term (current) use of aspirin: Secondary | ICD-10-CM | POA: Diagnosis not present

## 2017-02-23 DIAGNOSIS — Z79899 Other long term (current) drug therapy: Secondary | ICD-10-CM | POA: Diagnosis not present

## 2017-02-23 DIAGNOSIS — I890 Lymphedema, not elsewhere classified: Secondary | ICD-10-CM | POA: Diagnosis not present

## 2017-02-23 DIAGNOSIS — E11622 Type 2 diabetes mellitus with other skin ulcer: Secondary | ICD-10-CM | POA: Diagnosis not present

## 2017-02-23 DIAGNOSIS — E11621 Type 2 diabetes mellitus with foot ulcer: Secondary | ICD-10-CM | POA: Diagnosis not present

## 2017-02-23 DIAGNOSIS — I509 Heart failure, unspecified: Secondary | ICD-10-CM | POA: Diagnosis not present

## 2017-02-23 DIAGNOSIS — E785 Hyperlipidemia, unspecified: Secondary | ICD-10-CM | POA: Diagnosis not present

## 2017-02-26 DIAGNOSIS — Z7984 Long term (current) use of oral hypoglycemic drugs: Secondary | ICD-10-CM | POA: Diagnosis not present

## 2017-02-26 DIAGNOSIS — I429 Cardiomyopathy, unspecified: Secondary | ICD-10-CM | POA: Diagnosis not present

## 2017-02-26 DIAGNOSIS — I509 Heart failure, unspecified: Secondary | ICD-10-CM | POA: Diagnosis not present

## 2017-02-26 DIAGNOSIS — M1990 Unspecified osteoarthritis, unspecified site: Secondary | ICD-10-CM | POA: Diagnosis not present

## 2017-02-26 DIAGNOSIS — J45909 Unspecified asthma, uncomplicated: Secondary | ICD-10-CM | POA: Diagnosis not present

## 2017-02-26 DIAGNOSIS — L97222 Non-pressure chronic ulcer of left calf with fat layer exposed: Secondary | ICD-10-CM | POA: Diagnosis not present

## 2017-02-26 DIAGNOSIS — Z4800 Encounter for change or removal of nonsurgical wound dressing: Secondary | ICD-10-CM | POA: Diagnosis not present

## 2017-02-26 DIAGNOSIS — I2510 Atherosclerotic heart disease of native coronary artery without angina pectoris: Secondary | ICD-10-CM | POA: Diagnosis not present

## 2017-02-26 DIAGNOSIS — I272 Pulmonary hypertension, unspecified: Secondary | ICD-10-CM | POA: Diagnosis not present

## 2017-02-26 DIAGNOSIS — Z7951 Long term (current) use of inhaled steroids: Secondary | ICD-10-CM | POA: Diagnosis not present

## 2017-02-26 DIAGNOSIS — Z79891 Long term (current) use of opiate analgesic: Secondary | ICD-10-CM | POA: Diagnosis not present

## 2017-02-26 DIAGNOSIS — I110 Hypertensive heart disease with heart failure: Secondary | ICD-10-CM | POA: Diagnosis not present

## 2017-02-26 DIAGNOSIS — L97821 Non-pressure chronic ulcer of other part of left lower leg limited to breakdown of skin: Secondary | ICD-10-CM | POA: Diagnosis not present

## 2017-02-26 DIAGNOSIS — E11622 Type 2 diabetes mellitus with other skin ulcer: Secondary | ICD-10-CM | POA: Diagnosis not present

## 2017-02-27 ENCOUNTER — Other Ambulatory Visit: Payer: Self-pay | Admitting: Family Medicine

## 2017-02-27 NOTE — Progress Notes (Signed)
JAYLINA, RAMDASS ([id number]) Visit Report for 02/23/2017 Chief Complaint Document Details Patient Name: Jillian, Watson. Date of Service: 02/23/2017 10:00 AM Medical Record Number: [id number] Patient Account Number: [id number] Date of Birth/Sex: Jan 31, 1945 (72 y.o. Female) Treating RN: Montey Hora Primary Care Provider: Lelon Huh Other Clinician: Referring Provider: Lelon Huh Treating Provider/Extender: Melburn Hake, HOYT Weeks in Treatment: 20 Information Obtained from: Patient Chief Complaint Patient seen for complaints of Non-Healing Wounds to both lower extremities Electronic Signature(s) Signed: 02/24/2017 12:37:14 PM By: Worthy Keeler PA-C Entered By: Worthy Keeler on 02/23/2017 10:21:49 [name] ([id number]) -------------------------------------------------------------------------------- HPI Details Patient Name: [name]. Date of Service: 02/23/2017 10:00 AM Medical Record Number: [id number] Patient Account Number: [id number] Date of Birth/Sex: 18-Mar-1945 (72 y.o. Female) Treating RN: Montey Hora Primary Care Provider: Lelon Huh Other Clinician: Referring Provider: Lelon Huh Treating Provider/Extender: Melburn Hake, HOYT Weeks in Treatment: 20 History of Present Illness Location: both lower extremity swelling with ulceration HPI Description: 72 year old patient who sees her PCP Dr. Lelon Huh was recently evaluated 10 days ago for diabetes mellitus, hypertension, CHF and hyperlipidemia. she also was noted to have ulcerations develop in her legs and she has been applying Silvadene dressings locally. In the past she has refused wound care referrals.her cardiologist Dr. Saunders Revel saw her and put her on 40 mg of furosemide daily. last hemoglobin A1c was 7.7%. she was also placed on ciprofloxacin twice daily for 7 days and a urine culture was recommended. past medical history significant for coronary artery disease, diabetes mellitus, nonischemic  cardiomyopathy and pulmonary hypertension. She is also status post heart catheterization and coronary angiography, tubal ligation and breast cyst removal in the past. She has never been a smoker. Patient had arterial studies done which showed bilateral ABIs are artificially elevated due to noncompressible and calcified vessels. Triphasic waveform throughout. Right great toe TBI is elevated while the left is normal. 10/23/2016 -- the patient is rather moribund from several issues including chronic back pain and knee pain and swelling of her legs. The large necrotic area on her left lateral anterior calf was bleeding on touch after washing her leg. There was a spot which needed silver nitrate cauterization and this was done appropriately. 11/06/2016 -- she is again noted to have friable bleeding from the left lower extremity wounds and this again had to be cauterized with silver nitrate to control the bleeding as pressure itself would not do it. 11/20/2016 -- the right lower extremity is completely healed and we have ordered 30-40 mm compression stocking's in both the dural layer and also a pair of juxta lites. 12/18/2016 -- she has not been wearing her compression stockings on her right leg and these have opened out into ulcerations again. Her left lower extremity has circumferential ulcerations now. I believe at this stage I would like to get her venous reflux studies done to make certain that there is no fixable superficial venous reflux. 12/29/2016 -- she has made an excellent recovery having continued with her appropriate doses of diuretics and elevation and exercise. She has not yet received her right lower extremity juxta lites. Her venous duplex study is at the end of January 01/13/16 on evaluation today patient's wounds appeared to be overall doing much better much less hyper granular than previous week's evaluation. The Hydrofera Blue Dressing's to be doing very well. She does tell me that  she is having a little bit more discomfort in the posterior aspect of her leg where she has a wound  at this point. Fortunately there appears to be no infection. 01/19/17 on evaluation today patient appears to be doing very well in regard to her bilateral lower extremity swelling and at this point in time her left lower extremity ulcers. Her wounds appear to be doing much better. She has been tolerating the dressing's at this point fortunately she did get the Juxta-Lite compression as of today as well. Nonetheless I am pleased that she has been tolerating everything so well and that her wounds looks so good. In fact she is an excellent granular surface no evidence of slough covering and I do not see any reason for likely debridement today. Especially in regard to the posterior leg. 01/26/17 on evaluation today patient appears to be doing decently well in regard to her wounds. The surface of the majority of her wounds is greatly improved. She has been using the Juxta-Lite compression which is excellent. Nonetheless the wound of the left posterior calf did require some debridement today otherwise the majority of the wounds did not require any debridement. The this was due to slough buildup on the surface. She also has a small skin flap where two of the ulcers actually connected and this has loosened up I'm concerned that things may not heal well with that flap but for the time being I'm not gonna do anything different in that regard. Jillian, Watson ([id number]) 02/23/17 on evaluation today patient's wound actually appears to be doing fairly well in regard to the remaining left lateral ulcer which we have been treated. With that being said it does appear that her wrap actually slid down the wood bit over the past few days at least that there's actually some friction injury and blistering noted on the medial aspect of her malleolus of the left lower extremity. She continues to use the Juxta-Lite for the  right lower extremity. With that being said no debridement is necessary today and these are very superficial injuries as far as the new injuries are concerned. Electronic Signature(s) Signed: 02/24/2017 12:37:14 PM By: Worthy Keeler PA-C Entered By: Worthy Keeler on 02/24/2017 11:31:56 [name] ([id number]) -------------------------------------------------------------------------------- Physical Exam Details Patient Name: Jillian, Watson B. Date of Service: 02/23/2017 10:00 AM Medical Record Number: [id number] Patient Account Number: [id number] Date of Birth/Sex: September 25, 1945 (72 y.o. Female) Treating RN: Montey Hora Primary Care Provider: Lelon Huh Other Clinician: Referring Provider: Lelon Huh Treating Provider/Extender: Melburn Hake, HOYT Weeks in Treatment: 9 Constitutional Well-nourished and well-hydrated in no acute distress. Respiratory normal breathing without difficulty. clear to auscultation bilaterally. Cardiovascular regular rate and rhythm with normal S1, S2. Psychiatric this patient is able to make decisions and demonstrates good insight into disease process. Alert and Oriented x 3. pleasant and cooperative. Notes Patient's wound appears to be doing very well and there is no need for sharp debridement at this point. The biggest issue I see currently is that patient is going to need to somehow be rewrap differently either using the signal wrap around of the wrap at the top of the wrap in order to keep this from sliding down. Otherwise we may just need to switch to using the Juxta-Lite on this left side as well. Electronic Signature(s) Signed: 02/24/2017 12:37:14 PM By: Worthy Keeler PA-C Entered By: Worthy Keeler on 02/24/2017 11:33:18 [name] ([id number]) -------------------------------------------------------------------------------- Physician Orders Details Patient Name: LORETTE, PETERKIN B. Date of Service: 02/23/2017 10:00 AM Medical Record  Number: [id number] Patient Account Number: [id number] Date of Birth/Sex: 1945-06-21 (71 y.o.  Female) Treating RN: Montey Hora Primary Care Provider: Lelon Huh Other Clinician: Referring Provider: Lelon Huh Treating Provider/Extender: Melburn Hake, HOYT Weeks in Treatment: 20 Verbal / Phone Orders: No Diagnosis Coding ICD-10 Coding Code Description E11.622 Type 2 diabetes mellitus with other skin ulcer I89.0 Lymphedema, not elsewhere classified L97.212 Non-pressure chronic ulcer of right calf with fat layer exposed L97.222 Non-pressure chronic ulcer of left calf with fat layer exposed Wound Cleansing Wound #1 Left,Lateral Lower Leg o Clean wound with Normal Saline. o Cleanse wound with mild soap and water o May Shower, gently pat wound dry prior to applying new dressing. Wound #11 Left,Medial Lower Leg o Clean wound with Normal Saline. o Cleanse wound with mild soap and water o May Shower, gently pat wound dry prior to applying new dressing. Wound #13 Left Malleolus o Clean wound with Normal Saline. o Cleanse wound with mild soap and water o May Shower, gently pat wound dry prior to applying new dressing. Anesthetic (add to Medication List) Wound #1 Left,Lateral Lower Leg o Topical Lidocaine 4% cream applied to wound bed prior to debridement (In Clinic Only). Wound #11 Left,Medial Lower Leg o Topical Lidocaine 4% cream applied to wound bed prior to debridement (In Clinic Only). Wound #13 Left Malleolus o Topical Lidocaine 4% cream applied to wound bed prior to debridement (In Clinic Only). Primary Wound Dressing Wound #1 Left,Lateral Lower Leg o Hydrafera Blue - Please make sure this is Hydrafera Blue Ready Transfer Wound #11 Left,Medial Lower Leg o Hydrafera Blue - Please make sure this is Materials engineer Wound #13 Left Malleolus o Hydrafera Blue - Please make sure this is [employer]  ([id number]) Secondary Dressing Wound #1 Left,Lateral Lower Leg o ABD pad Wound #11 Left,Medial Lower Leg o ABD pad Wound #13 Left Malleolus o ABD pad Dressing Change Frequency Wound #1 Left,Lateral Lower Leg o Change Dressing Monday, Wednesday, Friday - HHRN to change Monday, Wednesday and Fridays and pt to come to [place] on Fridays. Wound #11 Left,Medial Lower Leg o Change Dressing Monday, Wednesday, Friday - HHRN to change Monday, Wednesday and Fridays and pt to come to [place] on Fridays. Wound #13 Left Malleolus o Change Dressing Monday, Wednesday, Friday - HHRN to change Monday, Wednesday and Fridays and pt to come to [place] on Fridays. Follow-up Appointments Wound #1 Left,Lateral Lower Leg o Return Appointment in 1 week. Wound #11 Left,Medial Lower Leg o Return Appointment in 1 week. Wound #13 Left Malleolus o Return Appointment in 1 week. Edema Control Wound #1 Left,Lateral Lower Leg o 3 Layer Compression System - Left Lower Extremity - ****Make sure you are only putting a 3-layer wrap on the left leg**** wrap up to 3 finger widths below the knee. o Patient to wear own Juxtalite/Juzo compression garment. - Please educate patient and spouse about juxtalite application and use o Elevate legs to the level of the heart and pump ankles as often as possible Wound #11 Left,Medial Lower Leg o 3 Layer Compression System - Left Lower Extremity - ****Make sure you are only putting a 3-layer wrap on the left leg**** wrap up to 3 finger widths below the knee. o Patient to wear own Juxtalite/Juzo compression garment. - Please educate patient and spouse about juxtalite application and use o Elevate legs to the level of the heart and pump ankles as often as possible Wound #13 Left Malleolus o 3 Layer Compression System - Left Lower Extremity - ****Make  sure you are only putting a 3-layer wrap on the left leg**** wrap up to  3 finger widths below the knee. o Patient to wear own Juxtalite/Juzo compression garment. - Please educate patient and spouse about juxtalite application and use o Elevate legs to the level of the heart and pump ankles as often as possible Additional Orders / Instructions Jillian, Watson ([id number]) Wound #1 Left,Lateral Lower Leg o Vitamin A; Vitamin C, Zinc o Increase protein intake. Wound #11 Left,Medial Lower Leg o Vitamin A; Vitamin C, Zinc o Increase protein intake. Wound #13 Left Malleolus o Vitamin A; Vitamin C, Zinc o Increase protein intake. Home Health Wound #1 Left,Lateral Lower Leg o Continue Home Health Visits - Please educate patient and spouse about juxtalite application and use o Home Health Nurse may visit PRN to address patientos wound care needs. o FACE TO FACE ENCOUNTER: MEDICARE and MEDICAID PATIENTS: I certify that this patient is under my care and that I had a face-to-face encounter that meets the physician face-to-face encounter requirements with this patient on this date. The encounter with the patient was in whole or in part for the following MEDICAL CONDITION: (primary reason for [place]) MEDICAL NECESSITY: I certify, that based on my findings, NURSING services are a medically necessary home health service. HOME BOUND STATUS: I certify that my clinical findings support that this patient is homebound (i.e., Due to illness or injury, pt requires aid of supportive devices such as crutches, cane, wheelchairs, walkers, the use of special transportation or the assistance of another person to leave their place of residence. There is a normal inability to leave the home and doing so requires considerable and taxing effort. Other absences are for medical reasons / religious services and are infrequent or of short duration when for other reasons). o If current dressing causes regression in wound condition, may D/C ordered dressing  product/s and apply Normal Saline Moist Dressing daily until next [place] / Other MD appointment. [place] of regression in wound condition at [phone]. o Please direct any NON-WOUND related issues/requests for orders to patient's Primary Care Physician Wound #11 Left,Medial Lower Leg o [place] Visits - Please educate patient and spouse about juxtalite application and use o Home Health Nurse may visit PRN to address patientos wound care needs. o FACE TO FACE ENCOUNTER: MEDICARE and MEDICAID PATIENTS: I certify that this patient is under my care and that I had a face-to-face encounter that meets the physician face-to-face encounter requirements with this patient on this date. The encounter with the patient was in whole or in part for the following MEDICAL CONDITION: (primary reason for [place]) MEDICAL NECESSITY: I certify, that based on my findings, NURSING services are a medically necessary home health service. HOME BOUND STATUS: I certify that my clinical findings support that this patient is homebound (i.e., Due to illness or injury, pt requires aid of supportive devices such as crutches, cane, wheelchairs, walkers, the use of special transportation or the assistance of another person to leave their place of residence. There is a normal inability to leave the home and doing so requires considerable and taxing effort. Other absences are for medical reasons / religious services and are infrequent or of short duration when for other reasons). o If current dressing causes regression in wound condition, may D/C ordered dressing product/s and apply Normal Saline Moist Dressing daily until next [place] / Other MD appointment. [place] of regression in  wound condition at [phone]. o Please direct any NON-WOUND related issues/requests for orders to patient's Primary Care Physician Wound #13 Left  [place] Visits - Please educate patient and spouse about juxtalite application and use o Home Health Nurse may visit PRN to address patientos wound care needs. o FACE TO FACE ENCOUNTER: MEDICARE and MEDICAID PATIENTS: I certify that this patient is under my care and that I had a face-to-face encounter that meets the physician face-to-face encounter requirements with this patient on this date. The encounter with the patient was in whole or in part for the following MEDICAL CONDITION: (primary reason for [place]) MEDICAL NECESSITY: I certify, that based on my findings, NURSING services are a medically necessary home health service. HOME BOUND STATUS: I certify that my clinical findings support that this patient is homebound (i.e., Due to illness or injury, pt requires aid of Jillian, Watson. ([id number]) supportive devices such as crutches, cane, wheelchairs, walkers, the use of special transportation or the assistance of another person to leave their place of residence. There is a normal inability to leave the home and doing so requires considerable and taxing effort. Other absences are for medical reasons / religious services and are infrequent or of short duration when for other reasons). o If current dressing causes regression in wound condition, may D/C ordered dressing product/s and apply Normal Saline Moist Dressing daily until next [place] / Other MD appointment. [place] of regression in wound condition at [phone]. o Please direct any NON-WOUND related issues/requests for orders to patient's Primary Care Physician Electronic Signature(s) Signed: 02/23/2017 5:21:02 PM By: Montey Hora Signed: 02/24/2017 12:37:14 PM By: Worthy Keeler PA-C Entered By: Montey Hora on 02/23/2017 10:54:06 [name] ([id number]) -------------------------------------------------------------------------------- Problem List  Details Patient Name: BREYONA, SWANDER B. Date of Service: 02/23/2017 10:00 AM Medical Record Number: [id number] Patient Account Number: [id number] Date of Birth/Sex: May 05, 1945 (72 y.o. Female) Treating RN: Montey Hora Primary Care Provider: Lelon Huh Other Clinician: Referring Provider: Lelon Huh Treating Provider/Extender: Melburn Hake, HOYT Weeks in Treatment: 20 Active Problems ICD-10 Encounter Code Description Active Date Diagnosis E11.622 Type 2 diabetes mellitus with other skin ulcer 10/02/2016 Yes I89.0 Lymphedema, not elsewhere classified 10/02/2016 Yes L97.212 Non-pressure chronic ulcer of right calf with fat layer exposed 10/02/2016 Yes L97.222 Non-pressure chronic ulcer of left calf with fat layer exposed 10/02/2016 Yes Inactive Problems Resolved Problems Electronic Signature(s) Signed: 02/24/2017 12:37:14 PM By: Worthy Keeler PA-C Entered By: Worthy Keeler on 02/23/2017 10:21:34 [name] ([id number]) -------------------------------------------------------------------------------- Progress Note Details Patient Name: [name] B. Date of Service: 02/23/2017 10:00 AM Medical Record Number: [id number] Patient Account Number: [id number] Date of Birth/Sex: 1945-04-08 (72 y.o. Female) Treating RN: Montey Hora Primary Care Provider: Lelon Huh Other Clinician: Referring Provider: Lelon Huh Treating Provider/Extender: Melburn Hake, HOYT Weeks in Treatment: 20 Subjective Chief Complaint Information obtained from Patient Patient seen for complaints of Non-Healing Wounds to both lower extremities History of Present Illness (HPI) The following HPI elements were documented for the patient's wound: Location: both lower extremity swelling with ulceration 72 year old patient who sees her PCP Dr. Lelon Huh was recently evaluated 10 days ago for diabetes mellitus, hypertension, CHF and hyperlipidemia. she also was noted to have ulcerations develop in her  legs and she has been applying Silvadene dressings locally. In the past she has refused wound care referrals.her cardiologist Dr. Saunders Revel saw her and put her on 40 mg of furosemide daily. last hemoglobin  A1c was 7.7%. she was also placed on ciprofloxacin twice daily for 7 days and a urine culture was recommended. past medical history significant for coronary artery disease, diabetes mellitus, nonischemic cardiomyopathy and pulmonary hypertension. She is also status post heart catheterization and coronary angiography, tubal ligation and breast cyst removal in the past. She has never been a smoker. Patient had arterial studies done which showed bilateral ABIs are artificially elevated due to noncompressible and calcified vessels. Triphasic waveform throughout. Right great toe TBI is elevated while the left is normal. 10/23/2016 -- the patient is rather moribund from several issues including chronic back pain and knee pain and swelling of her legs. The large necrotic area on her left lateral anterior calf was bleeding on touch after washing her leg. There was a spot which needed silver nitrate cauterization and this was done appropriately. 11/06/2016 -- she is again noted to have friable bleeding from the left lower extremity wounds and this again had to be cauterized with silver nitrate to control the bleeding as pressure itself would not do it. 11/20/2016 -- the right lower extremity is completely healed and we have ordered 30-40 mm compression stocking's in both the dural layer and also a pair of juxta lites. 12/18/2016 -- she has not been wearing her compression stockings on her right leg and these have opened out into ulcerations again. Her left lower extremity has circumferential ulcerations now. I believe at this stage I would like to get her venous reflux studies done to make certain that there is no fixable superficial venous reflux. 12/29/2016 -- she has made an excellent recovery having  continued with her appropriate doses of diuretics and elevation and exercise. She has not yet received her right lower extremity juxta lites. Her venous duplex study is at the end of January 01/13/16 on evaluation today patient's wounds appeared to be overall doing much better much less hyper granular than previous week's evaluation. The Hydrofera Blue Dressing's to be doing very well. She does tell me that she is having a little bit more discomfort in the posterior aspect of her leg where she has a wound at this point. Fortunately there appears to be no infection. 01/19/17 on evaluation today patient appears to be doing very well in regard to her bilateral lower extremity swelling and at this point in time her left lower extremity ulcers. Her wounds appear to be doing much better. She has been tolerating the dressing's at this point fortunately she did get the Juxta-Lite compression as of today as well. Nonetheless I am pleased that she has been tolerating everything so well and that her wounds looks so good. In fact she is an excellent granular surface no evidence of slough covering and I do not see any reason for likely debridement today. Especially in regard to the posterior Jillian, Cherish B. ([id number]) leg. 01/26/17 on evaluation today patient appears to be doing decently well in regard to her wounds. The surface of the majority of her wounds is greatly improved. She has been using the Juxta-Lite compression which is excellent. Nonetheless the wound of the left posterior calf did require some debridement today otherwise the majority of the wounds did not require any debridement. The this was due to slough buildup on the surface. She also has a small skin flap where two of the ulcers actually connected and this has loosened up I'm concerned that things may not heal well with that flap but for the time being I'm not gonna do anything different  in that regard. 02/23/17 on evaluation today patient's  wound actually appears to be doing fairly well in regard to the remaining left lateral ulcer which we have been treated. With that being said it does appear that her wrap actually slid down the wood bit over the past few days at least that there's actually some friction injury and blistering noted on the medial aspect of her malleolus of the left lower extremity. She continues to use the Juxta-Lite for the right lower extremity. With that being said no debridement is necessary today and these are very superficial injuries as far as the new injuries are concerned. Patient History Information obtained from Patient. Family History Cancer - Mother,Siblings, Diabetes - Mother,Siblings, Heart Disease - Mother, Hypertension - Siblings, No family history of Hereditary Spherocytosis, Kidney Disease, Lung Disease, Seizures, Stroke, Thyroid Problems, Tuberculosis. Social History Never smoker, Marital Status - Married, Alcohol Use - Never, Drug Use - No History, Caffeine Use - Daily. Review of Systems (ROS) Constitutional Symptoms (General Health) Denies complaints or symptoms of Fever, Chills. Respiratory The patient has no complaints or symptoms. Cardiovascular Complains or has symptoms of LE edema. Psychiatric The patient has no complaints or symptoms. Objective Constitutional Well-nourished and well-hydrated in no acute distress. Vitals Time Taken: 10:30 AM, Height: 64 in, Weight: 209 lbs, BMI: 35.9, Temperature: 97.8 F, Pulse: 60 bpm, Respiratory Rate: 18 breaths/min, Blood Pressure: 134/48 mmHg. Respiratory normal breathing without difficulty. clear to auscultation bilaterally. Cardiovascular regular rate and rhythm with normal S1, S2. Dobis, Christianna B. ([id number]) Psychiatric this patient is able to make decisions and demonstrates good insight into disease process. Alert and Oriented x 3. pleasant and cooperative. General Notes: Patient's wound appears to be doing very well and there is  no need for sharp debridement at this point. The biggest issue I see currently is that patient is going to need to somehow be rewrap differently either using the signal wrap around of the wrap at the top of the wrap in order to keep this from sliding down. Otherwise we may just need to switch to using the Juxta-Lite on this left side as well. Integumentary (Hair, Skin) Wound #1 status is Open. Original cause of wound was Gradually Appeared. The wound is located on the Left,Lateral Lower Leg. The wound measures 1.4cm length x 1cm width x 0.1cm depth; 1.1cm^2 area and 0.11cm^3 volume. There is Fat Layer (Subcutaneous Tissue) Exposed exposed. There is no tunneling or undermining noted. There is a large amount of serous drainage noted. The wound margin is flat and intact. There is medium (34-66%) red granulation within the wound bed. There is a medium (34-66%) amount of necrotic tissue within the wound bed including Eschar and Adherent Slough. The periwound skin appearance did not exhibit: Callus, Crepitus, Excoriation, Induration, Rash, Scarring, Dry/Scaly, Maceration, Atrophie Blanche, Cyanosis, Ecchymosis, Hemosiderin Staining, Mottled, Pallor, Rubor, Erythema. Periwound temperature was noted as No Abnormality. The periwound has tenderness on palpation. Wound #11 status is Open. Original cause of wound was Gradually Appeared. The wound is located on the Left,Medial Lower Leg. The wound measures 1.1cm length x 1cm width x 0.1cm depth; 0.864cm^2 area and 0.086cm^3 volume. There is no tunneling or undermining noted. There is a small amount of serous drainage noted. The wound margin is flat and intact. There is large (67-100%) pink granulation within the wound bed. There is no necrotic tissue within the wound bed. The periwound skin appearance did not exhibit: Callus, Crepitus, Excoriation, Induration, Rash, Scarring, Dry/Scaly, Maceration, Atrophie Blanche, Cyanosis, Ecchymosis,  Hemosiderin Staining,  Mottled, Pallor, Rubor, Erythema. Periwound temperature was noted as No Abnormality. The periwound has tenderness on palpation. Wound #13 status is Open. Original cause of wound was Blister. The wound is located on the Left Malleolus. The wound measures 1.5cm length x 2.2cm width x 0.1cm depth; 2.592cm^2 area and 0.259cm^3 volume. There is no tunneling or undermining noted. There is a small amount of drainage noted. The wound margin is flat and intact. There is large (67-100%) red granulation within the wound bed. There is a small (1-33%) amount of necrotic tissue within the wound bed including Adherent Slough. The periwound skin appearance did not exhibit: Callus, Crepitus, Excoriation, Induration, Rash, Scarring, Dry/Scaly, Maceration, Atrophie Blanche, Cyanosis, Ecchymosis, Hemosiderin Staining, Mottled, Pallor, Rubor, Erythema. Periwound temperature was noted as No Abnormality. The periwound has tenderness on palpation. 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 ([id number]) Pre-procedure diagnosis of Wound #1 is a Diabetic Wound/Ulcer of the Lower Extremity located on the Left,Lateral Lower Leg . There was a Three Layer Compression Therapy Procedure by Montey Hora, RN. Post procedure Diagnosis Wound #1: Same as Pre-Procedure Wound #11 Pre-procedure diagnosis of Wound #11 is a Diabetic Wound/Ulcer of the Lower Extremity located on the Left,Medial Lower Leg . There was a Three Layer Compression Therapy Procedure by Montey Hora, RN. Post procedure Diagnosis Wound #11: Same as Pre-Procedure Wound #13 Pre-procedure diagnosis of Wound #13 is a Lymphedema located on the Left Malleolus . There was a Three  Layer Compression Therapy Procedure by Montey Hora, RN. Post procedure Diagnosis Wound #13: Same as Pre-Procedure Plan Wound Cleansing: Wound #1 Left,Lateral Lower Leg: Clean wound with Normal Saline. Cleanse wound with mild soap and water May Shower, gently pat wound dry prior to applying new dressing. Wound #11 Left,Medial Lower Leg: Clean wound with Normal Saline. Cleanse wound with mild soap and water May Shower, gently pat wound dry prior to applying new dressing. Wound #13 Left Malleolus: Clean wound with Normal Saline. Cleanse wound with mild soap and water May Shower, gently pat wound dry prior to applying new dressing. Anesthetic (add to Medication List): Wound #1 Left,Lateral Lower Leg: Topical Lidocaine 4% cream applied to wound bed prior to debridement (In Clinic Only). Wound #11 Left,Medial Lower Leg: Topical Lidocaine 4% cream applied to wound bed prior to debridement (In Clinic Only). Wound #13 Left Malleolus: Topical Lidocaine 4% cream applied to wound bed prior to debridement (In Clinic Only). Primary Wound Dressing: Wound #1 Left,Lateral Lower Leg: Hydrafera Blue - Please make sure this is Hydrafera Blue Ready Transfer Wound #11 Left,Medial Lower Leg: Hydrafera Blue - Please make sure this is Hydrafera Blue Ready Transfer Wound #13 Left Malleolus: Hydrafera Blue - Please make sure this is Materials engineer Secondary Dressing: Wound #1 Left,Lateral Lower Leg: ABD pad Wound #11 Left,Medial Lower Leg: ABD pad Wound #13 Left Malleolus: ABD pad Dressing Change Frequency: Wound #1 Left,Lateral Lower Leg: Jillian, Watson ([id number]) Change Dressing Monday, Wednesday, Friday - HHRN to change Monday, Wednesday and Fridays and pt to come to [place] on Fridays. Wound #11 Left,Medial Lower Leg: Change Dressing Monday, Wednesday, Friday - HHRN to change Monday, Wednesday and Fridays and pt to come to [facility] on Fridays. Wound #13  Left  Malleolus: Change Dressing Monday, Wednesday, Friday - HHRN to change Monday, Wednesday and Fridays and pt to come to [facility] on Fridays. Follow-up Appointments: Wound #1 Left,Lateral Lower Leg: Return Appointment in 1 week. Wound #11 Left,Medial Lower Leg: Return Appointment in 1 week. Wound #13 Left Malleolus: Return Appointment in 1 week. Edema Control: Wound #1 Left,Lateral Lower Leg: 3 Layer Compression System - Left Lower Extremity - ****Make sure you are only putting a 3-layer wrap on the left leg**** wrap up to 3 finger widths below the knee. Patient to wear own Juxtalite/Juzo compression garment. - Please educate patient and spouse about juxtalite application and use Elevate legs to the level of the heart and pump ankles as often as possible Wound #11 Left,Medial Lower Leg: 3 Layer Compression System - Left Lower Extremity - ****Make sure you are only putting a 3-layer wrap on the left leg**** wrap up to 3 finger widths below the knee. Patient to wear own Juxtalite/Juzo compression garment. - Please educate patient and spouse about juxtalite application and use Elevate legs to the level of the heart and pump ankles as often as possible Wound #13 Left Malleolus: 3 Layer Compression System - Left Lower Extremity - ****Make sure you are only putting a 3-layer wrap on the left leg**** wrap up to 3 finger widths below the knee. Patient to wear own Juxtalite/Juzo compression garment. - Please educate patient and spouse about juxtalite application and use Elevate legs to the level of the heart and pump ankles as often as possible Additional Orders / Instructions: Wound #1 Left,Lateral Lower Leg: Vitamin A; Vitamin C, Zinc Increase protein intake. Wound #11 Left,Medial Lower Leg: Vitamin A; Vitamin C, Zinc Increase protein intake. Wound #13 Left Malleolus: Vitamin A; Vitamin C, Zinc Increase protein intake. Home Health: Wound #1 Left,Lateral Lower  Leg: Continue Home Health Visits - Please educate patient and spouse about juxtalite application and use Home Health Nurse may visit PRN to address patient s wound care needs. FACE TO FACE ENCOUNTER: MEDICARE and MEDICAID PATIENTS: I certify that this patient is under my care and that I had a face-to-face encounter that meets the physician face-to-face encounter requirements with this patient on this date. The encounter with the patient was in whole or in part for the following MEDICAL CONDITION: (primary reason for [place]) MEDICAL NECESSITY: I certify, that based on my findings, NURSING services are a medically necessary home health service. HOME BOUND STATUS: I certify that my clinical findings support that this patient is homebound (i.e., Due to illness or injury, pt requires aid of supportive devices such as crutches, cane, wheelchairs, walkers, the use of special transportation or the assistance of another person to leave their place of residence. There is a normal inability to leave the home and doing so requires considerable and taxing effort. Other absences are for medical reasons / religious services and are infrequent or of short duration when for other reasons). If current dressing causes regression in wound condition, may D/C ordered dressing product/s and apply Normal Saline Moist Dressing daily until next [place] / Other MD appointment. [place] of regression in wound condition at [phone]. Jillian, Watson ([id number]) Please direct any NON-WOUND related issues/requests for orders to patient's Primary Care Physician Wound #11 Left,Medial Lower Leg: [place] Visits - Please educate patient and spouse about juxtalite application and use Home Health Nurse may visit PRN to address patient s wound care needs. FACE TO FACE ENCOUNTER: MEDICARE and  MEDICAID PATIENTS: I certify that this patient is under my care and that I had a  face-to-face encounter that meets the physician face-to-face encounter requirements with this patient on this date. The encounter with the patient was in whole or in part for the following MEDICAL CONDITION: (primary reason for [place]) MEDICAL NECESSITY: I certify, that based on my findings, NURSING services are a medically necessary home health service. HOME BOUND STATUS: I certify that my clinical findings support that this patient is homebound (i.e., Due to illness or injury, pt requires aid of supportive devices such as crutches, cane, wheelchairs, walkers, the use of special transportation or the assistance of another person to leave their place of residence. There is a normal inability to leave the home and doing so requires considerable and taxing effort. Other absences are for medical reasons / religious services and are infrequent or of short duration when for other reasons). If current dressing causes regression in wound condition, may D/C ordered dressing product/s and apply Normal Saline Moist Dressing daily until next [place] / Other MD appointment. [place] of regression in wound condition at [phone]. Please direct any NON-WOUND related issues/requests for orders to patient's Primary Care Physician Wound #13 Left Malleolus: [place] Visits - Please educate patient and spouse about juxtalite application and use Home Health Nurse may visit PRN to address patient s wound care needs. FACE TO FACE ENCOUNTER: MEDICARE and MEDICAID PATIENTS: I certify that this patient is under my care and that I had a face-to-face encounter that meets the physician face-to-face encounter requirements with this patient on this date. The encounter with the patient was in whole or in part for the following MEDICAL CONDITION: (primary reason for [place]) MEDICAL NECESSITY: I certify, that based on my findings, NURSING services are a medically  necessary home health service. HOME BOUND STATUS: I certify that my clinical findings support that this patient is homebound (i.e., Due to illness or injury, pt requires aid of supportive devices such as crutches, cane, wheelchairs, walkers, the use of special transportation or the assistance of another person to leave their place of residence. There is a normal inability to leave the home and doing so requires considerable and taxing effort. Other absences are for medical reasons / religious services and are infrequent or of short duration when for other reasons). If current dressing causes regression in wound condition, may D/C ordered dressing product/s and apply Normal Saline Moist Dressing daily until next [place] / Other MD appointment. [place] of regression in wound condition at [phone]. Please direct any NON-WOUND related issues/requests for orders to patient's Primary Care Physician I am going to recommend that we continue with the Current wound care measures for the time being we will utilize the single wraparound at the top of the wrap of the Unna Boot wrap in order to prevent this from sliding down. Otherwise we will see how things do over the next week. If she's not doing better or if the [employer] wrap causes irritation we will subsequently proceed with using her Juxta-Lite wrap going forward and the patient will bring that with her at the next visit. Patient and her husband are in agreement with the plan. Please see above for specific wound care orders. We will see patient for re-evaluation in 1 week(s) here in the clinic. If anything worsens or changes patient will contact our office for additional recommendations. Electronic Signature(s) Signed: 02/24/2017 12:37:14 PM  By: Worthy Keeler PA-C Entered By: Worthy Keeler on 02/24/2017 11:34:36 [name]  ([id number]) -------------------------------------------------------------------------------- ROS/PFSH Details Patient Name: Jillian, Watson B. Date of Service: 02/23/2017 10:00 AM Medical Record Number: [id number] Patient Account Number: [id number] Date of Birth/Sex: [birth year] (72 y.o. Female) Treating RN: Montey Hora Primary Care Provider: Lelon Huh Other Clinician: Referring Provider: Lelon Huh Treating Provider/Extender: Melburn Hake, HOYT Weeks in Treatment: 20 Information Obtained From Patient Wound History Do you currently have one or more open woundso Yes How many open wounds do you currently haveo 6 Approximately how long have you had your woundso 2 months How have you been treating your wound(s) until nowo [place] Has your wound(s) ever healed and then re-openedo No Have you had any lab work done in the past montho No Have you tested positive for an antibiotic resistant organism (MRSA, VRE)o No Have you tested positive for osteomyelitis (bone infection)o No Have you had any tests for circulation on your legso Yes Where was the test doneo [facility] Have you had other problems associated with your woundso Infection, Swelling Constitutional Symptoms (General Health) Complaints and Symptoms: Negative for: Fever; Chills Cardiovascular Complaints and Symptoms: Positive for: LE edema Medical History: Positive for: Congestive Heart Failure; Coronary Artery Disease; Hypertension Respiratory Complaints and Symptoms: No Complaints or Symptoms Medical History: Positive for: Asthma Endocrine Medical History: Positive for: Type II Diabetes Time with diabetes: a long time Treated with: Oral agents Blood sugar tested every day: No Musculoskeletal Jillian, Watson ([id number]) Medical History: Positive for: Osteoarthritis Psychiatric Complaints and Symptoms: No Complaints or Symptoms Immunizations Pneumococcal Vaccine: Received Pneumococcal Vaccination: Yes Implantable  Devices Family and Social History Cancer: Yes - Mother,Siblings; Diabetes: Yes - Mother,Siblings; Heart Disease: Yes - Mother; Hereditary Spherocytosis: No; Hypertension: Yes - Siblings; Kidney Disease: No; Lung Disease: No; Seizures: No; Stroke: No; Thyroid Problems: No; Tuberculosis: No; Never smoker; Marital Status - Married; Alcohol Use: Never; Drug Use: No History; Caffeine Use: Daily; Financial Concerns: No; Food, Clothing or Shelter Needs: No; Support System Lacking: No; Transportation Concerns: No; Advanced Directives: No; Patient does not want information on Advanced Directives; Do not resuscitate: No; Living Will: No; Medical Power of Attorney: No Physician Affirmation I have reviewed and agree with the above information. Electronic Signature(s) Signed: 02/24/2017 12:37:14 PM By: Worthy Keeler PA-C Signed: 02/26/2017 4:35:46 PM By: Montey Hora Entered By: Worthy Keeler on 02/24/2017 11:32:17 [name] ([id number]) -------------------------------------------------------------------------------- SuperBill Details Patient Name: [name] B. Date of Service: 02/23/2017 Medical Record Number: [id number] Patient Account Number: [id number] Date of Birth/Sex: [birth year] (72 y.o. 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 Facility Procedures CPT4 Code: 24097353 Description: (Facility Use Only) [phone] - [place] LWR LT LEG Modifier: Quantity: 1 Physician Procedures CPT4 Code: [id number] Description: 22979 - WC PHYS LEVEL 3 -  EST PT ICD-10 Diagnosis Description E11.622 Type 2 diabetes mellitus with other skin ulcer I89.0 Lymphedema, not elsewhere classified L97.212 Non-pressure chronic ulcer of right calf with fat layer exp L97.222  Non-pressure chronic ulcer of left calf with fat layer expo Modifier: osed sed Quantity: 1 Electronic Signature(s) Signed: 02/24/2017 12:37:14 PM By: Worthy Keeler  PA-C Previous Signature: 02/23/2017 5:21:02 PM Version By: Montey Hora Entered By: Worthy Keeler on 02/24/2017 11:34:59

## 2017-02-28 DIAGNOSIS — Z4800 Encounter for change or removal of nonsurgical wound dressing: Secondary | ICD-10-CM | POA: Diagnosis not present

## 2017-02-28 DIAGNOSIS — L97821 Non-pressure chronic ulcer of other part of left lower leg limited to breakdown of skin: Secondary | ICD-10-CM | POA: Diagnosis not present

## 2017-02-28 DIAGNOSIS — I509 Heart failure, unspecified: Secondary | ICD-10-CM | POA: Diagnosis not present

## 2017-02-28 DIAGNOSIS — J45909 Unspecified asthma, uncomplicated: Secondary | ICD-10-CM | POA: Diagnosis not present

## 2017-02-28 DIAGNOSIS — E11622 Type 2 diabetes mellitus with other skin ulcer: Secondary | ICD-10-CM | POA: Diagnosis not present

## 2017-02-28 DIAGNOSIS — I272 Pulmonary hypertension, unspecified: Secondary | ICD-10-CM | POA: Diagnosis not present

## 2017-02-28 DIAGNOSIS — M1990 Unspecified osteoarthritis, unspecified site: Secondary | ICD-10-CM | POA: Diagnosis not present

## 2017-02-28 DIAGNOSIS — Z7951 Long term (current) use of inhaled steroids: Secondary | ICD-10-CM | POA: Diagnosis not present

## 2017-02-28 DIAGNOSIS — I2510 Atherosclerotic heart disease of native coronary artery without angina pectoris: Secondary | ICD-10-CM | POA: Diagnosis not present

## 2017-02-28 DIAGNOSIS — L97222 Non-pressure chronic ulcer of left calf with fat layer exposed: Secondary | ICD-10-CM | POA: Diagnosis not present

## 2017-02-28 DIAGNOSIS — Z7984 Long term (current) use of oral hypoglycemic drugs: Secondary | ICD-10-CM | POA: Diagnosis not present

## 2017-02-28 DIAGNOSIS — I110 Hypertensive heart disease with heart failure: Secondary | ICD-10-CM | POA: Diagnosis not present

## 2017-02-28 DIAGNOSIS — Z79891 Long term (current) use of opiate analgesic: Secondary | ICD-10-CM | POA: Diagnosis not present

## 2017-02-28 DIAGNOSIS — I429 Cardiomyopathy, unspecified: Secondary | ICD-10-CM | POA: Diagnosis not present

## 2017-02-28 NOTE — Progress Notes (Signed)
[facility] day:  03/01/2017   Chief Complaint: [name] is a 72 y.o. female with stage II multiple myeloma who is seen for 2 month assessment.  HPI: The patient was last seen in the medical oncology clinic on 12/28/2016.  At that time, patient continued to do well.  She denied any physical concerns.  Previously demonstrated wounds to her bilateral lower extremities had improved with the use of SSD cream and "triple wrappings".  Patient receiving home health nursing services twice a week for wound care.  WBC was 5,000 with an [place] of 2900.  Hemoglobin 8.2, hematocrit 25.2, and platelets 292,000.    IgG was elevated at 3434 ([id number]).  IgA normal at 235.  IgM normal at 29.  B12 level was low at 154.  24-hour urine demonstrated a elevated total protein level of 252.  Urinary kappa free light chains elevated at 216.  Lambda free light chains normal at 6.0.  Urinary kappa lambda free light chain ratio elevated at 36.0.  There was no observed M spike.  She was contacted regarding initiation of B12 injections. Patient has been taking oral B12 500 mcg daily for the last 2 weeks.   In the interim, patient is doing well. Husband notes that patient's legs are doing "very very well". She has been treated with hydrofera blue and triple compression wrappings. Patient's RIGHT leg has "completely healed". Plans are to discontinue wound care services soon.  She denies B symptoms or recent infections. Patient denies pain in the clinic today.    Past Medical History:  Diagnosis Date  . Aortic stenosis   . Asthma   . Coronary artery disease   . Diabetes mellitus without complication ([place])   . History of measles   . Hypertension   . Nonischemic cardiomyopathy ([place])   . Pulmonary hypertension ([place])     Past Surgical History:  Procedure Laterality Date  . Cyst(solitary) of breast:removed    . RIGHT/LEFT HEART CATH AND CORONARY ANGIOGRAPHY N/A 03/07/2016   Procedure: Right/Left Heart Cath and Coronary Angiography;  Surgeon: Nelva Bush, MD;  Location: [facility];  Service: Cardiovascular;  Laterality: N/A;  . TUBAL LIGATION      Family History  Problem Relation Age of Onset  . Cancer Mother        Uterine cancer  . Diabetes Mother   . Heart disease Mother   . Heart failure Mother   . Parkinson's disease Father   . Cancer Sister        breast cancer  . Diabetes Brother        Non-insulin Dependent Diabetes Mellitus  . Mesothelioma Brother   . Hypertension Sister   . Diabetes Sister        Non-insulin dependent Diabetes Mellitus    Social History:  reports that  has never smoked. she has never used smokeless tobacco. She reports that she does not drink alcohol or use drugs.  She is originally from [place]  Her husband was born in [place], [place].  They met in middle school in [place].  She has been married to her husband x 52 years.  The patient is accompanied by her husband, [name], today.  Allergies:  Allergies  Allergen Reactions  . Hydrochlorothiazide     Leg Cramps  . Penicillins Rash    Current Medications: Current Outpatient Medications  Medication Sig Dispense Refill  . amLODipine (NORVASC) 5 MG tablet TAKE  1 TABLET (5 MG TOTAL) BY MOUTH DAILY. 30 tablet 5  . aspirin EC 81 MG tablet Take 1 tablet (81 mg total) by mouth daily. 90 tablet 3  . cyanocobalamin 500 MCG tablet Take 500 mcg by mouth daily.    . furosemide (LASIX) 40 MG tablet Take 1 tablet (40 mg total) by mouth daily. 90 tablet 3  . lisinopril (PRINIVIL,ZESTRIL) 20 MG tablet Take 1 tablet (20 mg total) daily by mouth. 90 tablet 3  . lovastatin (MEVACOR) 20 MG tablet TAKE 1 TABLET (20 MG TOTAL) BY MOUTH AT BEDTIME. 90 tablet 4  . metFORMIN (GLUCOPHAGE-XR) 750 MG 24 hr tablet TAKE 2 TABLETS (1,500 MG TOTAL) BY MOUTH DAILY. 180 tablet 3  . metoprolol tartrate (LOPRESSOR) 50 MG tablet Take 1 tablet (50 mg total) by mouth 2 (two)  times daily. 180 tablet 3  . silver sulfADIAZINE (SILVADENE) 1 % cream APPLY TO AFFECTED AREA DAILY 85 g 5  . spironolactone (ALDACTONE) 25 MG tablet TAKE 1 TABLET (25 MG TOTAL) BY MOUTH DAILY. 30 tablet 12  . traZODone (DESYREL) 100 MG tablet TAKE 1 TABLET BY MOUTH EVERY NIGHT AT BEDTIME AS NEEDED FOR INSOMNIA 90 tablet 4  . fluticasone furoate-vilanterol (BREO ELLIPTA) 200-25 MCG/INH AEPB Inhale 1 puff into the lungs daily. FOR ASTHMA J45.20 (Patient not taking: Reported on 12/28/2016) 1 each 5  . HYDROcodone-acetaminophen (NORCO) 10-325 MG tablet Take 1 tablet by mouth every 8 (eight) hours as needed. (Patient not taking: Reported on 12/28/2016) 90 tablet 0  . meloxicam (MOBIC) 15 MG tablet Take 1 tablet (15 mg total) by mouth daily as needed for pain. Take with food (Patient not taking: Reported on 12/28/2016) 90 tablet 4   No current facility-administered medications for this visit.     Review of Systems:  GENERAL:  Feels "fine".  No fevers or sweats.  No new weight today.  PERFORMANCE STATUS (ECOG):  1 HEENT:  Chronic vision changes.  No visual changes, runny nose, sore throat, mouth sores or tenderness. Lungs:  No shortness of breath.  No cough.  No hemoptysis. Cardiac:  No chest pain, palpitations, orthopnea, or PND. GI:  Eating well.  No nausea, vomiting, diarrhea, constipation, melena or hematochezia.  Colonoscopy up-to-date. GU:  No urgency, dysuria, or hematuria. Musculoskeletal:  Lower back pain.  No joint pain.  No muscle tenderness. Extremities:  Lower extremity swelling, improved (see HPI).  Legs and feet hurt (chronic).   Skin:  Right leg "completely".  Left leg "almost healed".  No other rashes or skin changes. Neuro:  No headache, numbness or weakness, balance or coordination issues. Endocrine:  Diabetes.  No thyroid issues, hot flashes or night sweats. Psych:  Anxiety.  No mood changes, depression. Pain:  No pain.  Review of systems:  All other systems reviewed and found  to be negative.  Physical Exam: Blood pressure 107/69, pulse 63, temperature (!) 97.2 F (36.2 C), temperature source Tympanic, resp. rate 18, SpO2 97 %. GENERAL:  Well developed, well nourished woman sitting in a wheelchair in the exam room in no acute distress.  MENTAL STATUS:  Alert and oriented to person, place and time. HEAD:  Pearline Cables styled hair.  Normocephalic, atraumatic, face symmetric, no Cushingoid features. EYES:Glasses. Blue eyes.  Pupils equal round and reactive to light and accomodation. No conjunctivitis or scleral icterus. TRV:UYEBXIDHWY clear without lesion. Tonguenormal. Mucous membranes moist. RESPIRATORY:Clear to auscultationwithout rales, wheezes or rhonchi. CARDIOVASCULAR:Regular rate andrhythmwithout murmur, rub or gallop. ABDOMEN:Soft, non-tender, with active bowel sounds, and  no hepatosplenomegaly. No masses. SKIN: Pale.  No rashes, ulcers or lesions. EXTREMITIES: Chronic lower extremity edema. Legs wrapped.  Right leg unwrapped- healed. Chronic left lower extremity edema.  No skin discoloration or tenderness. No palpable cords. LYMPHNODES: No palpable cervical, supraclavicular, axillary or inguinal adenopathy  NEUROLOGICAL: Unremarkable. PSYCH: Appropriate.   Appointment on 03/01/2017  Component Date Value Ref Range Status  . Sodium 03/01/2017 134* 135 - 145 mmol/L Final  . Potassium 03/01/2017 4.5  3.5 - 5.1 mmol/L Final  . Chloride 03/01/2017 101  101 - 111 mmol/L Final  . CO2 03/01/2017 26  22 - 32 mmol/L Final  . Glucose, Bld 03/01/2017 99  65 - 99 mg/dL Final  . BUN 03/01/2017 28* 6 - 20 mg/dL Final  . Creatinine, Ser 03/01/2017 1.15* 0.44 - 1.00 mg/dL Final  . Calcium 03/01/2017 9.9  8.9 - 10.3 mg/dL Final  . Total Protein 03/01/2017 8.9* 6.5 - 8.1 g/dL Final  . Albumin 03/01/2017 3.5  3.5 - 5.0 g/dL Final  . AST 03/01/2017 19  15 - 41 U/L Final  . ALT 03/01/2017 10* 14 - 54 U/L Final  . Alkaline Phosphatase 03/01/2017 71  38 -  126 U/L Final  . Total Bilirubin 03/01/2017 0.4  0.3 - 1.2 mg/dL Final  . GFR calc non Af Amer 03/01/2017 46* >60 mL/min Final  . GFR calc Af Amer 03/01/2017 54* >60 mL/min Final   Comment: (NOTE) The eGFR has been calculated using the CKD EPI equation. This calculation has not been validated in all clinical situations. eGFR's persistently <60 mL/min signify possible Chronic Kidney Disease.   Georgiann Hahn gap 03/01/2017 7  5 - 15 Final   Performed at [facility] [place] , [place] [address]  . WBC 03/01/2017 4.9  3.6 - 11.0 K/uL Final  . RBC 03/01/2017 3.27* 3.80 - 5.20 MIL/uL Final  . Hemoglobin 03/01/2017 9.6* 12.0 - 16.0 g/dL Final  . HCT 03/01/2017 28.6* 35.0 - 47.0 % Final  . MCV 03/01/2017 87.4  80.0 - 100.0 fL Final  . MCH 03/01/2017 29.2  26.0 - 34.0 pg Final  . MCHC 03/01/2017 33.5  32.0 - 36.0 g/dL Final  . RDW 03/01/2017 16.2* 11.5 - 14.5 % Final  . Platelets 03/01/2017 255  150 - 440 K/uL Final  . Neutrophils Relative % 03/01/2017 53  % Final  . Neutro Abs 03/01/2017 2.6  1.4 - 6.5 K/uL Final  . Lymphocytes Relative 03/01/2017 35  % Final  . Lymphs Abs 03/01/2017 1.7  1.0 - 3.6 K/uL Final  . Monocytes Relative 03/01/2017 9  % Final  . Monocytes Absolute 03/01/2017 0.4  0.2 - 0.9 K/uL Final  . Eosinophils Relative 03/01/2017 2  % Final  . Eosinophils Absolute 03/01/2017 0.1  0 - 0.7 K/uL Final  . Basophils Relative 03/01/2017 1  % Final  . Basophils Absolute 03/01/2017 0.0  0 - 0.1 K/uL Final   Performed at [facility], [address] [place], [place] [address]    Assessment:  BERT GIVANS is a 72 y.o. female with stage II myeloma.  SPEP on 01/25/2016 revealed a 0.9 gm/dL IgG monoclonal gammopathy.  She denied any bone pain or recurrent infections.  Calcium and creatinine were normal.  Urinalysis revealed 30+ protein.  Work-up on 02/14/2016 revealed a hematocrit of 29.1, hemoglobin 9.4, MCV 84.6, platelets 225,000, white count 5700 with an  [place] of 3700.  SPEP revealed a 1.9 g/dL monoclonal spike.  Kappa free light  chains were 237.2, lambda free light chains 38.7 with a ratio of 6.13 (0.26-1.65).  Beta 2-microglobulin was 3.9 (0.6-2.4).  BMP revealed a creatinine of 0.95 and calcium 9.0.  24 hour UPEP revealed 31 mg/24 hours of an IgG monoclonal protein with kappa light chain specificity.  Bence Jones protein was positive, kappa type.  Bone marrow on 02/29/2016 revealed a hypercellular marrow for age with plasmacytosis (plasma cells 10%).  There was trilineage hematopoiesis. Immunohistochemical stains highlighted the plasma cell component which generally showed a polyclonal staining pattern for kappa and lambda light chain although there was kappa light chain excess especially in several plasma cell clusters. The features were most suggestive of early plasma cell dyscrasia/neoplasm.  Cytogenetics were normal (27, XX).  Bone survey on 03/22/2016 revealed no focal lesion or acute bony abnormality.  PET scan on 03/31/2016 revealed no significant abnormal hypermetabolic activity in the head, neck, chest, abdomen, pelvis, or skeleton.  SPEP has been followed: 0.9 gm/dL on 01/25/2016, 1.9 on 02/14/2016, 1.7 on 03/24/2016, 0.7 on 05/16/2016, 0.8 on 07/14/2016, 1.6 on 09/14/2016, 1.1 on 11/14/2016, and 0 on 03/01/2017.   She has a normocytic anemia.  Work-up on 01/25/2016 and 02/14/2016 revealed a normal ferritin (90), iron studies, folate, and thyroid function tests.  B12 was 312 (low normal) on 01/25/2016, but with a normal MMA on 02/14/2016 thus ruling out B12 deficiency.  Reticulocyte count is inappropiately low.  She has B12 deficiency.  B12 was 154 on 12/28/2016.   Last colonoscopy was 2 years ago.  Guaiac cards were negative x 3 in 04/2016.  Cardiac catheterization on 03/07/2016 revealed non-obstructive coronary artery disease.  She has severe pulmonary artery hypertension with moderate to severely elevated right heart filling pressures and  moderately elevated left heart filling pressures.   Symptomatically, she feels fine.  She receives home health services for wound care. Leg wounds are improving.  Hemoglobin 9.6, hematocrit 28.6, and platelets 255,000. WBC 4900 with an [place] of 2600.  M-spike is 0.  Plan: 1.  Labs today: CBC with differential, CMP, SPEP, folate. 2.  Review previous labs. 3.  Discuss B12 deficiency.  B12 level low at 154.  Discuss need for B12 supplementation.  Patient is taking oral B12. Will check labs in 2 week, and if not improving, we will plan on B12 injections.  4.  Discuss anemia. Plan for ongoing close surveillance.  Review indications for treatment of both anemia and myeloma. 5.  RTC in 2 weeks for labs (CBC, B12, antiparietal Ab, intrinsic factor Ab)  6.  RTC in 3 months for MD assessment and labs (CBC with diff, CMP).    Honor Loh, NP  03/01/2017, 3:05 PM   I saw and evaluated the patient, participating in the key portions of the service and reviewing pertinent diagnostic studies and records.  I reviewed the nurse practitioner's note and agree with the findings and the plan.  The assessment and plan were discussed with the patient.  Several questions were asked by the patient and answered.   Nolon Stalls, MD 03/01/2017,3:05 PM

## 2017-03-01 ENCOUNTER — Inpatient Hospital Stay (HOSPITAL_BASED_OUTPATIENT_CLINIC_OR_DEPARTMENT_OTHER): Payer: Medicare Other | Admitting: Hematology and Oncology

## 2017-03-01 ENCOUNTER — Encounter: Payer: Self-pay | Admitting: Hematology and Oncology

## 2017-03-01 ENCOUNTER — Other Ambulatory Visit: Payer: Self-pay | Admitting: Hematology and Oncology

## 2017-03-01 ENCOUNTER — Inpatient Hospital Stay: Payer: Medicare Other | Attending: Hematology and Oncology

## 2017-03-01 VITALS — BP 107/69 | HR 63 | Temp 97.2°F | Resp 18

## 2017-03-01 DIAGNOSIS — D649 Anemia, unspecified: Secondary | ICD-10-CM | POA: Diagnosis not present

## 2017-03-01 DIAGNOSIS — I2721 Secondary pulmonary arterial hypertension: Secondary | ICD-10-CM | POA: Diagnosis not present

## 2017-03-01 DIAGNOSIS — C9000 Multiple myeloma not having achieved remission: Secondary | ICD-10-CM | POA: Insufficient documentation

## 2017-03-01 DIAGNOSIS — I2510 Atherosclerotic heart disease of native coronary artery without angina pectoris: Secondary | ICD-10-CM

## 2017-03-01 DIAGNOSIS — I2729 Other secondary pulmonary hypertension: Secondary | ICD-10-CM | POA: Diagnosis not present

## 2017-03-01 DIAGNOSIS — E538 Deficiency of other specified B group vitamins: Secondary | ICD-10-CM | POA: Diagnosis not present

## 2017-03-01 DIAGNOSIS — Z7189 Other specified counseling: Secondary | ICD-10-CM

## 2017-03-01 LAB — COMPREHENSIVE METABOLIC PANEL
ALBUMIN: 3.5 g/dL (ref 3.5–5.0)
ALT: 10 U/L — AB (ref 14–54)
AST: 19 U/L (ref 15–41)
Alkaline Phosphatase: 71 U/L (ref 38–126)
Anion gap: 7 (ref 5–15)
BUN: 28 mg/dL — AB (ref 6–20)
CHLORIDE: 101 mmol/L (ref 101–111)
CO2: 26 mmol/L (ref 22–32)
Calcium: 9.9 mg/dL (ref 8.9–10.3)
Creatinine, Ser: 1.15 mg/dL — ABNORMAL HIGH (ref 0.44–1.00)
GFR calc Af Amer: 54 mL/min — ABNORMAL LOW (ref >60)
GFR calc non Af Amer: 46 mL/min — ABNORMAL LOW (ref >60)
GLUCOSE: 99 mg/dL (ref 65–99)
POTASSIUM: 4.5 mmol/L (ref 3.5–5.1)
Sodium: 134 mmol/L — ABNORMAL LOW (ref 135–145)
Total Bilirubin: 0.4 mg/dL (ref 0.3–1.2)
Total Protein: 8.9 g/dL — ABNORMAL HIGH (ref 6.5–8.1)

## 2017-03-01 LAB — CBC WITH DIFFERENTIAL/PLATELET
Basophils Absolute: 0 10*3/uL (ref 0–0.1)
Basophils Relative: 1 %
EOS PCT: 2 %
Eosinophils Absolute: 0.1 10*3/uL (ref 0–0.7)
HCT: 28.6 % — ABNORMAL LOW (ref 35.0–47.0)
Hemoglobin: 9.6 g/dL — ABNORMAL LOW (ref 12.0–16.0)
LYMPHS ABS: 1.7 10*3/uL (ref 1.0–3.6)
LYMPHS PCT: 35 %
MCH: 29.2 pg (ref 26.0–34.0)
MCHC: 33.5 g/dL (ref 32.0–36.0)
MCV: 87.4 fL (ref 80.0–100.0)
MONO ABS: 0.4 10*3/uL (ref 0.2–0.9)
MONOS PCT: 9 %
Neutro Abs: 2.6 10*3/uL (ref 1.4–6.5)
Neutrophils Relative %: 53 %
PLATELETS: 255 10*3/uL (ref 150–440)
RBC: 3.27 MIL/uL — ABNORMAL LOW (ref 3.80–5.20)
RDW: 16.2 % — AB (ref 11.5–14.5)
WBC: 4.9 10*3/uL (ref 3.6–11.0)

## 2017-03-01 LAB — FOLATE: Folate: 29 ng/mL (ref >5.9)

## 2017-03-01 NOTE — Progress Notes (Signed)
Patient is having to use stool softeners.  Appetite is good.  Husband started her on B-12 500 mg daily.  Has been going to wound clinic and pain clinic.

## 2017-03-01 NOTE — Progress Notes (Addendum)
KIERYN, BURTIS ([id number]) Visit Report for 02/23/2017 Arrival Information Details Patient Name: [name], [name]. Date of Service: 02/23/2017 10:00 AM Medical Record Number: [id number] Patient Account Number: [id number] Date of Birth/Sex: [birth year] (72 y.o. Female) Treating RN: Roger Shelter Primary Care Margarette Vannatter: Lelon Huh Other Clinician: Referring Melaysia Streed: Lelon Huh Treating Kynzlie Hilleary/Extender: Melburn Hake, HOYT Weeks in Treatment: 20 Visit Information History Since Last Visit All ordered tests and consults were completed: No Patient Arrived: Wheel Chair Added or deleted any medications: No Arrival Time: 10:24 Any new allergies or adverse reactions: No Accompanied By: self Had a fall or experienced change in No activities of daily living that may affect Transfer Assistance: None risk of falls: Patient Identification Verified: Yes Signs or symptoms of abuse/neglect since last visito No Secondary Verification Process Completed: Yes Hospitalized since last visit: No Patient Requires Transmission-Based No Pain Present Now: No Precautions: Patient Has Alerts: Yes Electronic Signature(s) Signed: 02/23/2017 4:58:29 PM By: Roger Shelter Entered By: Roger Shelter on 02/23/2017 10:25:00 [name] ([id number]) -------------------------------------------------------------------------------- Complex / Palliative Patient Assessment Details Patient Name: [name], [name] Watson. Date of Service: 02/23/2017 10:00 AM Medical Record Number: [id number] Patient Account Number: [id number] Date of Birth/Sex: 10-Nov-1945 (72 y.o. Female) Treating RN: Cornell Barman Primary Care Lalana Wachter: Lelon Huh Other Clinician: Referring Kwaku Mostafa: Lelon Huh Treating Alessa Mazur/Extender: Melburn Hake, HOYT Weeks in Treatment: 20 Palliative Management Criteria Complex Wound Management Criteria Patient has remarkable or complex co-morbidities requiring medications or treatments that extend wound  healing times. Examples: o Diabetes mellitus with chronic renal failure or end stage renal disease requiring dialysis o Advanced or poorly controlled rheumatoid arthritis o Diabetes mellitus and end stage chronic obstructive pulmonary disease o Active cancer with current chemo- or radiation therapy DM, CHF, CAD, Multiple Myeloma Care Approach Wound Care Plan: Complex Wound Management Electronic Signature(s) Signed: 03/01/2017 12:53:44 PM By: Gretta Cool, BSN, RN, CWS, Kim RN, BSN Signed: 03/21/2017 3:36:20 AM By: Worthy Keeler PA-C Entered By: Gretta Cool, BSN, RN, CWS, Kim on 03/01/2017 12:53:44 [name] ([id number]) -------------------------------------------------------------------------------- Compression Therapy Details Patient Name: [name], [name] Watson. Date of Service: 02/23/2017 10:00 AM Medical Record Number: [id number] Patient Account Number: [id number] Date of Birth/Sex: [birth year] (72 y.o. Female) Treating RN: Montey Hora Primary Care Nitish Roes: Lelon Huh Other Clinician: Referring Finlay Mills: Lelon Huh Treating Faiga Stones/Extender: Melburn Hake, HOYT Weeks in Treatment: 20 Compression Therapy Performed for Wound Assessment: Wound #1 Left,Lateral Lower Leg Performed By: Clinician Montey Hora, RN Compression Type: Three Layer Post Procedure Diagnosis Same as Pre-procedure Electronic Signature(s) Signed: 02/23/2017 5:21:02 PM By: Montey Hora Entered By: Montey Hora on 02/23/2017 10:54:59 [name] ([id number]) -------------------------------------------------------------------------------- Compression Therapy Details Patient Name: [name], [name] Watson. Date of Service: 02/23/2017 10:00 AM Medical Record Number: [id number] Patient Account Number: [id number] Date of Birth/Sex: 1945/11/26 (72 y.o. Female) Treating RN: Montey Hora Primary Care Naoko Diperna: Lelon Huh Other Clinician: Referring Heitor Steinhoff: Lelon Huh Treating Damonica Chopra/Extender: Melburn Hake,  HOYT Weeks in Treatment: 20 Compression Therapy Performed for Wound Assessment: Wound #11 Left,Medial Lower Leg Performed By: Clinician Montey Hora, RN Compression Type: Three Layer Post Procedure Diagnosis Same as Pre-procedure Electronic Signature(s) Signed: 02/23/2017 5:21:02 PM By: Montey Hora Entered By: Montey Hora on 02/23/2017 10:55:00 [name] ([id number]) -------------------------------------------------------------------------------- Compression Therapy Details Patient Name: [name], [name] Watson. Date of Service: 02/23/2017 10:00 AM Medical Record Number: [id number] Patient Account Number: [id number] Date of Birth/Sex: 03/07/1945 (72 y.o. Female) Treating RN: Montey Hora Primary Care Krishawna Stiefel: Lelon Huh Other Clinician: Referring Krystyl Cannell: Lelon Huh Treating Rosina Cressler/Extender: Melburn Hake, HOYT Weeks  in Treatment: 20 Compression Therapy Performed for Wound Assessment: Wound #13 Left Malleolus Performed By: Clinician Montey Hora, RN Compression Type: Three Layer Post Procedure Diagnosis Same as Pre-procedure Electronic Signature(s) Signed: 02/23/2017 5:21:02 PM By: Montey Hora Entered By: Montey Hora on 02/23/2017 10:55:00 [name] ([id number]) -------------------------------------------------------------------------------- Encounter Discharge Information Details Patient Name: [name], [name] Watson. Date of Service: 02/23/2017 10:00 AM Medical Record Number: [id number] Patient Account Number: [id number] Date of Birth/Sex: 1945-12-09 (72 y.o. Female) Treating RN: Ahmed Prima Primary Care Lerry Cordrey: Lelon Huh Other Clinician: Referring Shaqueena Mauceri: Lelon Huh Treating Ballard Budney/Extender: Melburn Hake, HOYT Weeks in Treatment: 20 Encounter Discharge Information Items Discharge Pain Level: 0 Discharge Condition: Stable Ambulatory Status: Wheelchair Discharge Destination: Home Transportation: Private Auto Accompanied By: self Schedule  Follow-up Appointment: Yes Medication Reconciliation completed and No provided to Patient/Care Mang Hazelrigg: Provided on Clinical Summary of Care: 02/23/2017 Form Type Recipient Paper Patient CS Electronic Signature(s) Signed: 02/27/2017 8:07:32 AM By: Ruthine Dose Entered By: Ruthine Dose on 02/23/2017 11:20:39 [name] ([id number]) -------------------------------------------------------------------------------- Lower Extremity Assessment Details Patient Name: [name] Watson. Date of Service: 02/23/2017 10:00 AM Medical Record Number: [id number] Patient Account Number: [id number] Date of Birth/Sex: 01-13-1945 (72 y.o. Female) Treating RN: Roger Shelter Primary Care Maximillion Gill: Lelon Huh Other Clinician: Referring Vernal Rutan: Lelon Huh Treating Sandeep Delagarza/Extender: Melburn Hake, HOYT Weeks in Treatment: 20 Edema Assessment Assessed: [Left: No] [Right: No] [Left: Edema] [Right: :] Calf Left: Right: Point of Measurement: 30 cm From Medial Instep 40 cm cm Ankle Left: Right: Point of Measurement: 10 cm From Medial Instep 24 cm cm Vascular Assessment Claudication: Claudication Assessment [Left:None] Pulses: Dorsalis Pedis Palpable: [Left:Yes] Posterior Tibial Extremity colors, hair growth, and conditions: Extremity Color: [Left:Normal] Hair Growth on Extremity: [Left:No] Temperature of Extremity: [Left:Cool] Toe Nail Assessment Left: Right: Thick: Yes Discolored: Yes Deformed: Yes Improper Length and Hygiene: Yes Electronic Signature(s) Signed: 02/23/2017 4:58:29 PM By: Roger Shelter Entered By: Roger Shelter on 02/23/2017 10:41:05 [name] ([id number]) -------------------------------------------------------------------------------- Multi Wound Chart Details Patient Name: [name] Watson. Date of Service: 02/23/2017 10:00 AM Medical Record Number: [id number] Patient Account Number: [id number] Date of Birth/Sex: 17-Sep-1945 (72 y.o. Female) Treating RN:  Montey Hora Primary Care Sante Biedermann: Lelon Huh Other Clinician: Referring Janeisha Ryle: Lelon Huh Treating Justus Duerr/Extender: Melburn Hake, HOYT Weeks in Treatment: 20 Vital Signs Height(in): 25 Pulse(bpm): 60 Weight(lbs): 209 Blood Pressure(mmHg): 134/48 Body Mass Index(BMI): 36 Temperature(F): 97.8 Respiratory Rate 18 (breaths/min): Photos: [1:No Photos] [11:No Photos] [13:No Photos] Wound Location: [1:Left Lower Leg - Lateral] [11:Left Lower Leg - Medial] [13:Left Malleolus] Wounding Event: [1:Gradually Appeared] [11:Gradually Appeared] [13:Blister] Primary Etiology: [1:Diabetic Wound/Ulcer of the Diabetic Wound/Ulcer of the Lymphedema Lower Extremity] [11:Lower Extremity] Comorbid History: [1:Asthma, Congestive Heart Failure, Coronary Artery Disease, Hypertension, Type II Disease, Hypertension, Type II Disease, Hypertension, Type II Diabetes, Osteoarthritis] [11:Asthma, Congestive Heart Failure, Coronary Artery Diabetes,  Osteoarthritis] [13:Asthma, Congestive Heart Failure, Coronary Artery Diabetes, Osteoarthritis] Date Acquired: [1:08/02/2016] [11:01/26/2017] [13:02/23/2017] Weeks of Treatment: [1:20] [11:4] [13:0] Wound Status: [1:Open] [11:Open] [13:Open] Clustered Wound: [1:No] [11:Yes] [13:No] Measurements L x W x D [1:1.4x1x0.1] [11:1.1x1x0.1] [13:1.5x2.2x0.1] (cm) Area (cm) : [1:1.1] [11:0.864] [13:2.592] Volume (cm) : [1:0.11] [11:0.086] [13:0.259] % Reduction in Area: [1:93.60%] [11:94.10%] [13:0.00%] % Reduction in Volume: [1:93.60%] [11:94.20%] [13:0.00%] Classification: [1:Grade 2] [11:Grade 2] [13:Partial Thickness] Exudate Amount: [1:Large] [11:Small] [13:Small] Exudate Type: [1:Serous] [11:Serous] [13:N/A] Exudate Color: [1:amber] [11:amber] [13:N/A] Wound Margin: [1:Flat and Intact] [11:Flat and Intact] [13:Flat and Intact] Granulation Amount: [1:Medium (34-66%)] [11:Large (67-100%)] [13:Large (67-100%)] Granulation Quality: [1:Red] [11:Pink]  [13:Red] Necrotic Amount: [  1:Medium (34-66%)] [11:None Present (0%)] [13:Small (1-33%)] Necrotic Tissue: [1:Eschar, Adherent Slough] [11:N/A] [13:Adherent Slough] Exposed Structures: [1:Fat Layer (Subcutaneous Tissue) Exposed: Yes Fascia: No Tendon: No Muscle: No Joint: No Bone: No] [11:Fascia: No Fat Layer (Subcutaneous Tissue) Exposed: No Tendon: No Muscle: No Joint: No Bone: No] [13:N/A] Epithelialization: [1:Medium (34-66%)] [11:Large (67-100%)] [13:Medium (34-66%)] Periwound Skin Texture: [name], [name] ([id number]) Excoriation: No Excoriation: No Excoriation: No Induration: No Induration: No Induration: No Callus: No Callus: No Callus: No Crepitus: No Crepitus: No Crepitus: No Rash: No Rash: No Rash: No Scarring: No Scarring: No Scarring: No Periwound Skin Moisture: Maceration: No Maceration: No Maceration: No Dry/Scaly: No Dry/Scaly: No Dry/Scaly: No Periwound Skin Color: Atrophie Blanche: No Atrophie Blanche: No Atrophie Blanche: No Cyanosis: No Cyanosis: No Cyanosis: No Ecchymosis: No Ecchymosis: No Ecchymosis: No Erythema: No Erythema: No Erythema: No Hemosiderin Staining: No Hemosiderin Staining: No Hemosiderin Staining: No Mottled: No Mottled: No Mottled: No Pallor: No Pallor: No Pallor: No Rubor: No Rubor: No Rubor: No Temperature: No Abnormality No Abnormality No Abnormality Tenderness on Palpation: Yes Yes Yes Wound Preparation: Ulcer Cleansing: Other: soap Ulcer Cleansing: Other: soap Ulcer Cleansing: and water and water Rinsed/Irrigated with Saline Topical Anesthetic Applied: Topical Anesthetic Applied: Topical Anesthetic Applied: Other: lidocaine 4% Other: lidocaine 4% Other: lidocaine 4% Treatment Notes Electronic Signature(s) Signed: 02/23/2017 5:21:02 PM By: Montey Hora Entered By: Montey Hora on 02/23/2017 10:47:43 [name]  ([id number]) -------------------------------------------------------------------------------- Omega Details Patient Name: [name], [name] Watson. Date of Service: 02/23/2017 10:00 AM Medical Record Number: [id number] Patient Account Number: [id number] Date of Birth/Sex: 1945-07-03 (72 y.o. Female) Treating RN: Montey Hora Primary Care Jalil Lorusso: Lelon Huh Other Clinician: Referring Seraj Dunnam: Lelon Huh Treating Keeghan Bialy/Extender: Melburn Hake, HOYT Weeks in Treatment: 20 Active Inactive ` Abuse / Safety / Falls / Self Care Management Nursing Diagnoses: History of Falls Potential for falls Goals: Patient will not experience any injury related to falls Date Initiated: 10/02/2016 Target Resolution Date: 01/06/2017 Goal Status: Active Interventions: Assess: immobility, friction, shearing, incontinence upon admission and as needed Assess impairment of mobility on admission and as needed per policy Assess personal safety and home safety (as indicated) on admission and as needed Notes: ` Nutrition Nursing Diagnoses: Imbalanced nutrition Impaired glucose control: actual or potential Potential for alteratiion in Nutrition/Potential for imbalanced nutrition Goals: Patient/caregiver agrees to and verbalizes understanding of need to use nutritional supplements and/or vitamins as prescribed Date Initiated: 10/02/2016 Target Resolution Date: 02/10/2017 Goal Status: Active Patient/caregiver will maintain therapeutic glucose control Date Initiated: 10/02/2016 Target Resolution Date: 01/06/2017 Goal Status: Active Interventions: Assess patient nutrition upon admission and as needed per policy Provide education on elevated blood sugars and impact on wound healing Provide education on nutrition Treatment Activities: Education provided on Nutrition : 10/02/2016 [name], [name] ([id number]) Notes: ` Orientation to the Wound Care Program Nursing Diagnoses: Knowledge deficit  related to the wound healing center program Goals: Patient/caregiver will verbalize understanding of the [place] Date Initiated: 10/02/2016 Target Resolution Date: 11/11/2016 Goal Status: Active Interventions: Provide education on orientation to the wound center Notes: ` Pain, Acute or Chronic Nursing Diagnoses: Pain, acute or chronic: actual or potential Potential alteration in comfort, pain Goals: Patient/caregiver will verbalize adequate pain control between visits Date Initiated: 10/02/2016 Target Resolution Date: 02/10/2017 Goal Status: Active Interventions: Complete pain assessment as per visit requirements Notes: ` Wound/Skin Impairment Nursing Diagnoses: Impaired tissue integrity Knowledge deficit related to ulceration/compromised skin integrity Goals: Ulcer/skin breakdown will have a volume reduction of 80%  by week 12 Date Initiated: 10/02/2016 Target Resolution Date: 02/03/2017 Goal Status: Active Interventions: Assess patient/caregiver ability to perform ulcer/skin care regimen upon admission and as needed Assess ulceration(s) every visit Notes: [name], [name] ([id number]) Electronic Signature(s) Signed: 02/23/2017 5:21:02 PM By: Montey Hora Entered By: Montey Hora on 02/23/2017 10:47:32 [name] ([id number]) -------------------------------------------------------------------------------- Pain Assessment Details Patient Name: [name], [name] Watson. Date of Service: 02/23/2017 10:00 AM Medical Record Number: [id number] Patient Account Number: [id number] Date of Birth/Sex: 17-Jan-1945 (72 y.o. Female) Treating RN: Roger Shelter Primary Care Ryane Canavan: Lelon Huh Other Clinician: Referring Ceri Mayer: Lelon Huh Treating Shandora Koogler/Extender: Melburn Hake, HOYT Weeks in Treatment: 20 Active Problems Location of Pain Severity and Description of Pain Patient Has Paino No Site Locations Pain Management and Medication Current Pain  Management: Electronic Signature(s) Signed: 02/23/2017 4:58:29 PM By: Roger Shelter Entered By: Roger Shelter on 02/23/2017 10:29:34 [name] ([id number]) -------------------------------------------------------------------------------- Patient/Caregiver Education Details Patient Name: [name]. Date of Service: 02/23/2017 10:00 AM Medical Record Number: [id number] Patient Account Number: [id number] Date of Birth/Gender: [birth year] (72 y.o. Female) Treating RN: Ahmed Prima Primary Care Physician: Lelon Huh Other Clinician: Referring Physician: Lelon Huh Treating Physician/Extender: Sharalyn Ink in Treatment: 20 Education Assessment Education Provided To: Patient Education Topics Provided Wound/Skin Impairment: Handouts: Caring for Your Ulcer, Other: do not get wraps wet Methods: Demonstration, Explain/Verbal Responses: State content correctly Electronic Signature(s) Signed: 02/28/2017 4:30:35 PM By: Alric Quan Entered By: Alric Quan on 02/23/2017 10:58:59 [name] ([id number]) -------------------------------------------------------------------------------- Wound Assessment Details Patient Name: [name] Watson. Date of Service: 02/23/2017 10:00 AM Medical Record Number: [id number] Patient Account Number: [id number] Date of Birth/Sex: [birth year] (72 y.o. Female) Treating RN: Roger Shelter Primary Care Dezarai Prew: Lelon Huh Other Clinician: Referring Yunior Jain: Lelon Huh Treating Lamberto Dinapoli/Extender: Melburn Hake, HOYT Weeks in Treatment: 20 Wound Status Wound Number: 1 Primary Diabetic Wound/Ulcer of the Lower Extremity Etiology: Wound Location: Left Lower Leg - Lateral Wound Open Wounding Event: Gradually Appeared Status: Date Acquired: 08/02/2016 Comorbid Asthma, Congestive Heart Failure, Coronary Weeks Of Treatment: 20 History: Artery Disease, Hypertension, Type II Diabetes, Clustered Wound:  No Osteoarthritis Photos Photo Uploaded By: Roger Shelter on 02/23/2017 16:30:09 Wound Measurements Length: (cm) 1.4 Width: (cm) 1 Depth: (cm) 0.1 Area: (cm) 1.1 Volume: (cm) 0.11 % Reduction in Area: 93.6% % Reduction in Volume: 93.6% Epithelialization: Medium (34-66%) Tunneling: No Undermining: No Wound Description Classification: Grade 2 Wound Margin: Flat and Intact Exudate Amount: Large Exudate Type: Serous Exudate Color: amber Foul Odor After Cleansing: No Slough/Fibrino Yes Wound Bed Granulation Amount: Medium (34-66%) Exposed Structure Granulation Quality: Red Fascia Exposed: No Necrotic Amount: Medium (34-66%) Fat Layer (Subcutaneous Tissue) Exposed: Yes Necrotic Quality: Eschar, Adherent Slough Tendon Exposed: No Muscle Exposed: No Joint Exposed: No Bone Exposed: No Periwound Skin Texture [name], [name]. ([id number]) Texture Color No Abnormalities Noted: No No Abnormalities Noted: No Callus: No Atrophie Blanche: No Crepitus: No Cyanosis: No Excoriation: No Ecchymosis: No Induration: No Erythema: No Rash: No Hemosiderin Staining: No Scarring: No Mottled: No Pallor: No Moisture Rubor: No No Abnormalities Noted: No Dry / Scaly: No Temperature / Pain Maceration: No Temperature: No Abnormality Tenderness on Palpation: Yes Wound Preparation Ulcer Cleansing: Other: soap and water, Topical Anesthetic Applied: Other: lidocaine 4%, Electronic Signature(s) Signed: 02/23/2017 4:58:29 PM By: Roger Shelter Entered By: Roger Shelter on 02/23/2017 10:35:11 [name] ([id number]) -------------------------------------------------------------------------------- Wound Assessment Details Patient Name: LAURENCE, FOLZ Watson. Date of Service: 02/23/2017 10:00 AM Medical Record Number: [id number] Patient Account Number: [id number] Date of  Birth/Sex: [birth year] (72 y.o. Female) Treating RN: Roger Shelter Primary Care Huma Imhoff: Lelon Huh Other  Clinician: Referring Khelani Kops: Lelon Huh Treating Henlee Donovan/Extender: Melburn Hake, HOYT Weeks in Treatment: 20 Wound Status Wound Number: 11 Primary Diabetic Wound/Ulcer of the Lower Extremity Etiology: Wound Location: Left Lower Leg - Medial Wound Open Wounding Event: Gradually Appeared Status: Date Acquired: 01/26/2017 Comorbid Asthma, Congestive Heart Failure, Coronary Weeks Of Treatment: 4 History: Artery Disease, Hypertension, Type II Diabetes, Clustered Wound: Yes Osteoarthritis Photos Photo Uploaded By: Roger Shelter on 02/23/2017 16:30:21 Wound Measurements Length: (cm) 1.1 Width: (cm) 1 Depth: (cm) 0.1 Area: (cm) 0.864 Volume: (cm) 0.086 % Reduction in Area: 94.1% % Reduction in Volume: 94.2% Epithelialization: Large (67-100%) Tunneling: No Undermining: No Wound Description Classification: Grade 2 Wound Margin: Flat and Intact Exudate Amount: Small Exudate Type: Serous Exudate Color: amber Foul Odor After Cleansing: No Slough/Fibrino Yes Wound Bed Granulation Amount: Large (67-100%) Exposed Structure Granulation Quality: Pink Fascia Exposed: No Necrotic Amount: None Present (0%) Fat Layer (Subcutaneous Tissue) Exposed: No Tendon Exposed: No Muscle Exposed: No Joint Exposed: No Bone Exposed: No Periwound Skin Texture Metter, Devaeh Watson. ([id number]) Texture Color No Abnormalities Noted: No No Abnormalities Noted: No Callus: No Atrophie Blanche: No Crepitus: No Cyanosis: No Excoriation: No Ecchymosis: No Induration: No Erythema: No Rash: No Hemosiderin Staining: No Scarring: No Mottled: No Pallor: No Moisture Rubor: No No Abnormalities Noted: No Dry / Scaly: No Temperature / Pain Maceration: No Temperature: No Abnormality Tenderness on Palpation: Yes Wound Preparation Ulcer Cleansing: Other: soap and water, Topical Anesthetic Applied: Other: lidocaine 4%, Electronic Signature(s) Signed: 02/23/2017 4:58:29 PM By: Roger Shelter Entered By: Roger Shelter on 02/23/2017 10:36:42 [name] ([id number]) -------------------------------------------------------------------------------- Wound Assessment Details Patient Name: CHARISH, SCHROEPFER Watson. Date of Service: 02/23/2017 10:00 AM Medical Record Number: [id number] Patient Account Number: [id number] Date of Birth/Sex: Nov 12, 1945 (72 y.o. Female) Treating RN: Roger Shelter Primary Care Jaelen Soth: Lelon Huh Other Clinician: Referring Kellen Dutch: Lelon Huh Treating Esta Carmon/Extender: Melburn Hake, HOYT Weeks in Treatment: 20 Wound Status Wound Number: 13 Primary Lymphedema Etiology: Wound Location: Left Malleolus Wound Open Wounding Event: Blister Status: Date Acquired: 02/23/2017 Comorbid Asthma, Congestive Heart Failure, Coronary Weeks Of Treatment: 0 History: Artery Disease, Hypertension, Type II Diabetes, Clustered Wound: No Osteoarthritis Photos Photo Uploaded By: Roger Shelter on 02/23/2017 16:30:34 Wound Measurements Length: (cm) 1.5 Width: (cm) 2.2 Depth: (cm) 0.1 Area: (cm) 2.592 Volume: (cm) 0.259 % Reduction in Area: 0% % Reduction in Volume: 0% Epithelialization: Medium (34-66%) Tunneling: No Undermining: No Wound Description Classification: Partial Thickness Wound Margin: Flat and Intact Exudate Amount: Small Foul Odor After Cleansing: No Slough/Fibrino No Wound Bed Granulation Amount: Large (67-100%) Granulation Quality: Red Necrotic Amount: Small (1-33%) Necrotic Quality: Adherent Slough Periwound Skin Texture Texture Color No Abnormalities Noted: No No Abnormalities Noted: No Callus: No Atrophie Blanche: No Crepitus: No Cyanosis: No Excoriation: No Ecchymosis: No TERESIA, MYINT Watson. ([id number]) Induration: No Erythema: No Rash: No Hemosiderin Staining: No Scarring: No Mottled: No Pallor: No Moisture Rubor: No No Abnormalities Noted: No Dry / Scaly: No Temperature / Pain Maceration: No Temperature:  No Abnormality Tenderness on Palpation: Yes Wound Preparation Ulcer Cleansing: Rinsed/Irrigated with Saline Topical Anesthetic Applied: Other: lidocaine 4%, Electronic Signature(s) Signed: 02/23/2017 4:58:29 PM By: Roger Shelter Entered By: Roger Shelter on 02/23/2017 10:38:29 [name] ([id number]) -------------------------------------------------------------------------------- Vitals Details Patient Name: [name] Watson. Date of Service: 02/23/2017 10:00 AM Medical Record Number: [id number] Patient Account Number: [id number] Date of Birth/Sex: Mar 07, 1945 (72 y.o. Female) Treating RN: Flinchum,  Cheryl Primary Care Tailey Top: Lelon Huh Other Clinician: Referring Omer Puccinelli: Lelon Huh Treating Casie Sturgeon/Extender: Melburn Hake, HOYT Weeks in Treatment: 20 Vital Signs Time Taken: 10:30 Temperature (F): 97.8 Height (in): 64 Pulse (bpm): 60 Weight (lbs): 209 Respiratory Rate (breaths/min): 18 Body Mass Index (BMI): 35.9 Blood Pressure (mmHg): 134/48 Reference Range: 80 - 120 mg / dl Electronic Signature(s) Signed: 02/23/2017 4:58:29 PM By: Roger Shelter Entered By: Roger Shelter on 02/23/2017 10:31:54

## 2017-03-02 ENCOUNTER — Encounter: Payer: Medicare Other | Attending: Physician Assistant | Admitting: Physician Assistant

## 2017-03-02 ENCOUNTER — Telehealth: Payer: Self-pay | Admitting: *Deleted

## 2017-03-02 DIAGNOSIS — I110 Hypertensive heart disease with heart failure: Secondary | ICD-10-CM | POA: Insufficient documentation

## 2017-03-02 DIAGNOSIS — G8929 Other chronic pain: Secondary | ICD-10-CM | POA: Insufficient documentation

## 2017-03-02 DIAGNOSIS — L97212 Non-pressure chronic ulcer of right calf with fat layer exposed: Secondary | ICD-10-CM | POA: Insufficient documentation

## 2017-03-02 DIAGNOSIS — E785 Hyperlipidemia, unspecified: Secondary | ICD-10-CM | POA: Insufficient documentation

## 2017-03-02 DIAGNOSIS — J45909 Unspecified asthma, uncomplicated: Secondary | ICD-10-CM | POA: Insufficient documentation

## 2017-03-02 DIAGNOSIS — M549 Dorsalgia, unspecified: Secondary | ICD-10-CM | POA: Diagnosis not present

## 2017-03-02 DIAGNOSIS — M1990 Unspecified osteoarthritis, unspecified site: Secondary | ICD-10-CM | POA: Insufficient documentation

## 2017-03-02 DIAGNOSIS — E11622 Type 2 diabetes mellitus with other skin ulcer: Secondary | ICD-10-CM | POA: Insufficient documentation

## 2017-03-02 DIAGNOSIS — E1151 Type 2 diabetes mellitus with diabetic peripheral angiopathy without gangrene: Secondary | ICD-10-CM | POA: Diagnosis not present

## 2017-03-02 DIAGNOSIS — I428 Other cardiomyopathies: Secondary | ICD-10-CM | POA: Insufficient documentation

## 2017-03-02 DIAGNOSIS — L97329 Non-pressure chronic ulcer of left ankle with unspecified severity: Secondary | ICD-10-CM | POA: Diagnosis not present

## 2017-03-02 DIAGNOSIS — I2510 Atherosclerotic heart disease of native coronary artery without angina pectoris: Secondary | ICD-10-CM | POA: Diagnosis not present

## 2017-03-02 DIAGNOSIS — I890 Lymphedema, not elsewhere classified: Secondary | ICD-10-CM | POA: Diagnosis not present

## 2017-03-02 DIAGNOSIS — L97222 Non-pressure chronic ulcer of left calf with fat layer exposed: Secondary | ICD-10-CM | POA: Diagnosis not present

## 2017-03-02 DIAGNOSIS — I509 Heart failure, unspecified: Secondary | ICD-10-CM | POA: Insufficient documentation

## 2017-03-02 DIAGNOSIS — I272 Pulmonary hypertension, unspecified: Secondary | ICD-10-CM | POA: Insufficient documentation

## 2017-03-02 LAB — PROTEIN ELECTROPHORESIS, SERUM
A/G Ratio: 0.7 (ref 0.7–1.7)
Albumin ELP: 3.2 g/dL (ref 2.9–4.4)
Alpha-1-Globulin: 0.2 g/dL (ref 0.0–0.4)
Alpha-2-Globulin: 0.8 g/dL (ref 0.4–1.0)
Beta Globulin: 1 g/dL (ref 0.7–1.3)
Gamma Globulin: 2.9 g/dL — ABNORMAL HIGH (ref 0.4–1.8)
Globulin, Total: 4.9 g/dL — ABNORMAL HIGH (ref 2.2–3.9)
Total Protein ELP: 8.1 g/dL (ref 6.0–8.5)

## 2017-03-02 NOTE — Telephone Encounter (Signed)
Called patient and spoke to husband to tell them patient's M-spike is 0.

## 2017-03-02 NOTE — Telephone Encounter (Signed)
-----   Message from Lequita Asal, MD sent at 03/02/2017  4:30 PM EST ----- Regarding: Please call patient  M-spike is 0.  M  ----- Message ----- From: Interface, [facility] [place] Sent: 03/01/2017   9:04 PM To: Lequita Asal, MD

## 2017-03-05 DIAGNOSIS — L97821 Non-pressure chronic ulcer of other part of left lower leg limited to breakdown of skin: Secondary | ICD-10-CM | POA: Diagnosis not present

## 2017-03-05 DIAGNOSIS — I2510 Atherosclerotic heart disease of native coronary artery without angina pectoris: Secondary | ICD-10-CM | POA: Diagnosis not present

## 2017-03-05 DIAGNOSIS — I110 Hypertensive heart disease with heart failure: Secondary | ICD-10-CM | POA: Diagnosis not present

## 2017-03-05 DIAGNOSIS — I272 Pulmonary hypertension, unspecified: Secondary | ICD-10-CM | POA: Diagnosis not present

## 2017-03-05 DIAGNOSIS — Z4800 Encounter for change or removal of nonsurgical wound dressing: Secondary | ICD-10-CM | POA: Diagnosis not present

## 2017-03-05 DIAGNOSIS — M1990 Unspecified osteoarthritis, unspecified site: Secondary | ICD-10-CM | POA: Diagnosis not present

## 2017-03-05 DIAGNOSIS — Z7951 Long term (current) use of inhaled steroids: Secondary | ICD-10-CM | POA: Diagnosis not present

## 2017-03-05 DIAGNOSIS — I429 Cardiomyopathy, unspecified: Secondary | ICD-10-CM | POA: Diagnosis not present

## 2017-03-05 DIAGNOSIS — L97222 Non-pressure chronic ulcer of left calf with fat layer exposed: Secondary | ICD-10-CM | POA: Diagnosis not present

## 2017-03-05 DIAGNOSIS — E11622 Type 2 diabetes mellitus with other skin ulcer: Secondary | ICD-10-CM | POA: Diagnosis not present

## 2017-03-05 DIAGNOSIS — I509 Heart failure, unspecified: Secondary | ICD-10-CM | POA: Diagnosis not present

## 2017-03-05 DIAGNOSIS — Z79891 Long term (current) use of opiate analgesic: Secondary | ICD-10-CM | POA: Diagnosis not present

## 2017-03-05 DIAGNOSIS — Z7984 Long term (current) use of oral hypoglycemic drugs: Secondary | ICD-10-CM | POA: Diagnosis not present

## 2017-03-05 DIAGNOSIS — J45909 Unspecified asthma, uncomplicated: Secondary | ICD-10-CM | POA: Diagnosis not present

## 2017-03-05 NOTE — Progress Notes (Signed)
[name], [name] ([id number]) Visit Report for 03/02/2017 Chief Complaint Document Details Patient Name: [name], [name]. Date of Service: 03/02/2017 10:00 AM Medical Record Number: [id number] Patient Account Number: [id number] Date of Birth/Sex: 20-Nov-1945 (72 y.o. Female) Treating RN: [name] Primary Care Provider: Lelon Watson Other Clinician: Referring Provider: Lelon Watson Treating Provider/Extender: [name], [name]: 21 Information Obtained from: Patient Chief Complaint Patient seen for complaints of Non-Healing Wounds to both lower extremities Electronic Signature(s) Signed: 03/03/2017 10:02:55 PM By: Jillian Keeler PA-C Entered By: [name] on 03/02/2017 10:01:44 [name] ([id number]) -------------------------------------------------------------------------------- HPI Details Patient Name: [name]. Date of Service: 03/02/2017 10:00 AM Medical Record Number: [id number] Patient Account Number: [id number] Date of Birth/Sex: [birth year] (72 y.o. Female) Treating RN: [name] Primary Care Provider: Lelon Watson Other Clinician: Referring Provider: Lelon Watson Treating Provider/Extender: [name], [name] Weeks in Watson: 21 History of Present Illness Location: both lower extremity swelling with ulceration HPI Description: 72 year old patient who sees her PCP Dr. Lelon Watson was recently evaluated 10 days ago for diabetes mellitus, hypertension, CHF and hyperlipidemia. she also was noted to have ulcerations develop in her legs and she has been applying Silvadene dressings locally. In the past she has refused wound care referrals.her cardiologist Dr. Saunders Revel saw her and put her on 40 mg of furosemide daily. last hemoglobin A1c was 7.7%. she was also placed on ciprofloxacin twice daily for 7 days and a urine culture was recommended. past medical history significant for coronary artery disease, diabetes mellitus, nonischemic cardiomyopathy and  pulmonary hypertension. She is also status post heart catheterization and coronary angiography, tubal ligation and breast cyst removal in the past. She has never been a smoker. Patient had arterial studies done which showed bilateral ABIs are artificially elevated due to noncompressible and calcified vessels. Triphasic waveform throughout. Right great toe TBI is elevated while the left is normal. 10/23/2016 -- the patient is rather moribund from several issues including chronic back pain and knee pain and swelling of her legs. The large necrotic area on her left lateral anterior calf was bleeding on touch after washing her leg. There was a spot which needed silver nitrate cauterization and this was done appropriately. 11/06/2016 -- she is again noted to have friable bleeding from the left lower extremity wounds and this again had to be cauterized with silver nitrate to control the bleeding as pressure itself would not do it. 11/20/2016 -- the right lower extremity is completely healed and we have ordered 30-40 mm compression stocking's in both the dural layer and also a pair of juxta lites. 12/18/2016 -- she has not been wearing her compression stockings on her right leg and these have opened out into ulcerations again. Her left lower extremity has circumferential ulcerations now. I believe at this stage I would like to get her venous reflux studies done to make certain that there is no fixable superficial venous reflux. 12/29/2016 -- she has made an excellent recovery having continued with her appropriate doses of diuretics and elevation and exercise. She has not yet received her right lower extremity juxta lites. Her venous duplex study is at the end of January 01/13/16 on evaluation today patient's wounds appeared to be overall doing much better much less hyper granular than previous week's evaluation. The Hydrofera Blue Dressing's to be doing very well. She does tell me that she is having a  little bit more discomfort in the posterior aspect of her leg where she has a wound  at this point. Fortunately there appears to be no infection. 01/19/17 on evaluation today patient appears to be doing very well in regard to her bilateral lower extremity swelling and at this point in time her left lower extremity ulcers. Her wounds appear to be doing much better. She has been tolerating the dressing's at this point fortunately she did get the Juxta-Lite compression as of today as well. Nonetheless I am pleased that she has been tolerating everything so well and that her wounds looks so good. In fact she is an excellent granular surface no evidence of slough covering and I do not see any reason for likely debridement today. Especially in regard to the posterior leg. 01/26/17 on evaluation today patient appears to be doing decently well in regard to her wounds. The surface of the majority of her wounds is greatly improved. She has been using the Juxta-Lite compression which is excellent. Nonetheless the wound of the left posterior calf did require some debridement today otherwise the majority of the wounds did not require any debridement. The this was due to slough buildup on the surface. She also has a small skin flap where two of the ulcers actually connected and this has loosened up I'm concerned that things may not heal well with that flap but for the time being I'm not gonna do anything different in that regard. Jillian, Watson ([id number]) 02/23/17 on evaluation today patient's wound actually appears to be doing fairly well in regard to the remaining left lateral ulcer which we have been treated. With that being said it does appear that her wrap actually slid down the wood bit over the past few days at least that there's actually some friction injury and blistering noted on the medial aspect of her malleolus of the left lower extremity. She continues to use the Juxta-Lite for the right lower  extremity. With that being said no debridement is necessary today and these are very superficial injuries as far as the new injuries are concerned. 03/02/17 on evaluation today patient appears to be doing excellent in regard to her left lateral lower from the ulcers. In fact it appears that she is almost completely healed in regard to the ulcer we have been following and the new injuries which were caused by the wraps slipping down last week have been completely resolved which is excellent news. Overall I'm definitely pleased with the progress that she has made. Fortunately the rat did not cause any irritation as it did previously with the Unna wrap. Electronic Signature(s) Signed: 03/03/2017 10:02:55 PM By: Jillian Keeler PA-C Entered By: [name] on 03/02/2017 13:22:22 [name] ([id number]) -------------------------------------------------------------------------------- Physical Exam Details Patient Name: [name], Jillian B. Date of Service: 03/02/2017 10:00 AM Medical Record Number: [id number] Patient Account Number: [id number] Date of Birth/Sex: 1945-06-19 (72 y.o. Female) Treating RN: [name] Primary Care Provider: Lelon Watson Other Clinician: Referring Provider: Lelon Watson Treating Provider/Extender: [name], Zeniyah Peaster Weeks in Watson: 21 Constitutional Chronically ill appearing but in no apparent acute distress. Respiratory normal breathing without difficulty. clear to auscultation bilaterally. Cardiovascular regular rate and rhythm with normal S1, S2. 1+ pitting edema of the bilateral lower extremities. Psychiatric this patient is able to make decisions and demonstrates good insight into disease process. Alert and Oriented x 3. pleasant and cooperative. Notes Patient's wound showed an excellent surface granular at this point and there is no evidence of slough noted definitely no need for debridement. She did bring her Juxta-Lite with her today but  that was not  needed. Electronic Signature(s) Signed: 03/03/2017 10:02:55 PM By: Jillian Keeler PA-C Entered By: [name] on 03/02/2017 13:23:14 [name] ([id number]) -------------------------------------------------------------------------------- Physician Orders Details Patient Name: ANEIRA, CAVITT B. Date of Service: 03/02/2017 10:00 AM Medical Record Number: [id number] Patient Account Number: [id number] Date of Birth/Sex: [birth year] (72 y.o. Female) Treating RN: [name] Primary Care Provider: Lelon Watson Other Clinician: Referring Provider: Lelon Watson Treating Provider/Extender: [name], Jonuel Butterfield Weeks in Watson: 32 Verbal / Phone Orders: No Diagnosis Coding ICD-10 Coding Code Description E11.622 Type 2 diabetes mellitus with other skin ulcer I89.0 Lymphedema, not elsewhere classified L97.212 Non-pressure chronic ulcer of right calf with fat layer exposed L97.222 Non-pressure chronic ulcer of left calf with fat layer exposed Wound Cleansing Wound #1 Left,Lateral Lower Leg o Clean wound with Normal Saline. o Cleanse wound with mild soap and water o May Shower, gently pat wound dry prior to applying new dressing. Anesthetic (add to Medication List) Wound #1 Left,Lateral Lower Leg o Topical Lidocaine 4% cream applied to wound bed prior to debridement (In Clinic Only). Primary Wound Dressing Wound #1 Left,Lateral Lower Leg o Hydrafera Blue - Please make sure this is Hydrafera Blue Ready Transfer Secondary Dressing Wound #1 Left,Lateral Lower Leg o ABD pad Dressing Change Frequency Wound #1 Left,Lateral Lower Leg o Change Dressing Monday, Wednesday, Friday - HHRN to change Monday, Wednesday and Fridays and pt to come to [facility] on Fridays. Follow-up Appointments Wound #1 Left,Lateral Lower Leg o Return Appointment in 1 week. Edema Control Wound #1 Left,Lateral Lower Leg o 3 Layer Compression System - Left Lower Extremity - ****Make sure you  are only putting a 3-layer wrap on the left leg**** wrap up to 3 finger widths below the knee. o Patient to wear own Juxtalite/Juzo compression garment. - Please educate patient and spouse about juxtalite application and use o Elevate legs to the level of the heart and pump ankles as often as possible [name], Jillian B. ([id number]) Additional Orders / Instructions Wound #1 Left,Lateral Lower Leg o Vitamin A; Vitamin C, Zinc o Increase protein intake. Home Health Wound #1 [place] Nurse may visit PRN to address patientos wound care needs. o FACE TO FACE ENCOUNTER: MEDICARE and MEDICAID PATIENTS: I certify that this patient is under my care and that I had a face-to-face encounter that meets the physician face-to-face encounter requirements with this patient on this date. The encounter with the patient was in whole or in part for the following MEDICAL CONDITION: (primary reason for [place]) MEDICAL NECESSITY: I certify, that based on my findings, NURSING services are a medically necessary home health service. HOME BOUND STATUS: I certify that my clinical findings support that this patient is homebound (i.e., Due to illness or injury, pt requires aid of supportive devices such as crutches, cane, wheelchairs, walkers, the use of special transportation or the assistance of another person to leave their place of residence. There is a normal inability to leave the home and doing so requires considerable and taxing effort. Other absences are for medical reasons / religious services and are infrequent or of short duration when for other reasons). o If current dressing causes regression in wound condition, may D/C ordered dressing product/s and apply Normal Saline Moist Dressing daily until next [place] / Other MD appointment. [place] of regression in wound condition at [phone]. o  Please direct any NON-WOUND related issues/requests for  orders to patient's Primary Care Physician Electronic Signature(s) Signed: 03/03/2017 10:02:55 PM By: Jillian Keeler PA-C Signed: 03/05/2017 10:40:22 AM By: Gretta Cool, BSN, RN, CWS, Kim RN, BSN Entered By: Gretta Cool, BSN, RN, CWS, Kim on 03/02/2017 10:23:16 [name] ([id number]) -------------------------------------------------------------------------------- Problem List Details Patient Name: NALY, SCHWANZ. Date of Service: 03/02/2017 10:00 AM Medical Record Number: [id number] Patient Account Number: [id number] Date of Birth/Sex: 1945/11/05 (72 y.o. Female) Treating RN: [name] Primary Care Provider: Lelon Watson Other Clinician: Referring Provider: Lelon Watson Treating Provider/Extender: [name], Kysen Wetherington Weeks in Watson: 21 Active Problems ICD-10 Encounter Code Description Active Date Diagnosis E11.622 Type 2 diabetes mellitus with other skin ulcer 10/02/2016 Yes I89.0 Lymphedema, not elsewhere classified 10/02/2016 Yes L97.212 Non-pressure chronic ulcer of right calf with fat layer exposed 10/02/2016 Yes L97.222 Non-pressure chronic ulcer of left calf with fat layer exposed 10/02/2016 Yes Inactive Problems Resolved Problems Electronic Signature(s) Signed: 03/03/2017 10:02:55 PM By: Jillian Keeler PA-C Entered By: [name] on 03/02/2017 10:01:35 [name] ([id number]) -------------------------------------------------------------------------------- Progress Note Details Patient Name: Jillian Albe B. Date of Service: 03/02/2017 10:00 AM Medical Record Number: [id number] Patient Account Number: [id number] Date of Birth/Sex: 06/09/1945 (72 y.o. Female) Treating RN: [name] Primary Care Provider: Lelon Watson Other Clinician: Referring Provider: Lelon Watson Treating Provider/Extender: [name], Chai Routh Weeks in Watson: 21 Subjective Chief Complaint Information obtained from Patient Patient seen for  complaints of Non-Healing Wounds to both lower extremities History of Present Illness (HPI) The following HPI elements were documented for the patient's wound: Location: both lower extremity swelling with ulceration 72 year old patient who sees her PCP Dr. Lelon Watson was recently evaluated 10 days ago for diabetes mellitus, hypertension, CHF and hyperlipidemia. she also was noted to have ulcerations develop in her legs and she has been applying Silvadene dressings locally. In the past she has refused wound care referrals.her cardiologist Dr. Saunders Revel saw her and put her on 40 mg of furosemide daily. last hemoglobin A1c was 7.7%. she was also placed on ciprofloxacin twice daily for 7 days and a urine culture was recommended. past medical history significant for coronary artery disease, diabetes mellitus, nonischemic cardiomyopathy and pulmonary hypertension. She is also status post heart catheterization and coronary angiography, tubal ligation and breast cyst removal in the past. She has never been a smoker. Patient had arterial studies done which showed bilateral ABIs are artificially elevated due to noncompressible and calcified vessels. Triphasic waveform throughout. Right great toe TBI is elevated while the left is normal. 10/23/2016 -- the patient is rather moribund from several issues including chronic back pain and knee pain and swelling of her legs. The large necrotic area on her left lateral anterior calf was bleeding on touch after washing her leg. There was a spot which needed silver nitrate cauterization and this was done appropriately. 11/06/2016 -- she is again noted to have friable bleeding from the left lower extremity wounds and this again had to be cauterized with silver nitrate to control the bleeding as pressure itself would not do it. 11/20/2016 -- the right lower extremity is completely healed and we have ordered 30-40 mm compression stocking's in both the dural layer and also  a pair of juxta lites. 12/18/2016 -- she has not been wearing her compression stockings on her right leg and these have opened out into ulcerations again. Her left lower extremity has circumferential ulcerations now. I believe at this stage I would like to get her venous reflux studies done to make certain that  there is no fixable superficial venous reflux. 12/29/2016 -- she has made an excellent recovery having continued with her appropriate doses of diuretics and elevation and exercise. She has not yet received her right lower extremity juxta lites. Her venous duplex study is at the end of January 01/13/16 on evaluation today patient's wounds appeared to be overall doing much better much less hyper granular than previous week's evaluation. The Hydrofera Blue Dressing's to be doing very well. She does tell me that she is having a little bit more discomfort in the posterior aspect of her leg where she has a wound at this point. Fortunately there appears to be no infection. 01/19/17 on evaluation today patient appears to be doing very well in regard to her bilateral lower extremity swelling and at this point in time her left lower extremity ulcers. Her wounds appear to be doing much better. She has been tolerating the dressing's at this point fortunately she did get the Juxta-Lite compression as of today as well. Nonetheless I am pleased that she has been tolerating everything so well and that her wounds looks so good. In fact she is an excellent granular surface no evidence of slough covering and I do not see any reason for likely debridement today. Especially in regard to the posterior [name], Jillian B. ([id number]) leg. 01/26/17 on evaluation today patient appears to be doing decently well in regard to her wounds. The surface of the majority of her wounds is greatly improved. She has been using the Juxta-Lite compression which is excellent. Nonetheless the wound of the left posterior calf did require  some debridement today otherwise the majority of the wounds did not require any debridement. The this was due to slough buildup on the surface. She also has a small skin flap where two of the ulcers actually connected and this has loosened up I'm concerned that things may not heal well with that flap but for the time being I'm not gonna do anything different in that regard. 02/23/17 on evaluation today patient's wound actually appears to be doing fairly well in regard to the remaining left lateral ulcer which we have been treated. With that being said it does appear that her wrap actually slid down the wood bit over the past few days at least that there's actually some friction injury and blistering noted on the medial aspect of her malleolus of the left lower extremity. She continues to use the Juxta-Lite for the right lower extremity. With that being said no debridement is necessary today and these are very superficial injuries as far as the new injuries are concerned. 03/02/17 on evaluation today patient appears to be doing excellent in regard to her left lateral lower from the ulcers. In fact it appears that she is almost completely healed in regard to the ulcer we have been following and the new injuries which were caused by the wraps slipping down last week have been completely resolved which is excellent news. Overall I'm definitely pleased with the progress that she has made. Fortunately the rat did not cause any irritation as it did previously with the Unna wrap. Patient History Information obtained from Patient. Family History Cancer - Mother,Siblings, Diabetes - Mother,Siblings, Heart Disease - Mother, Hypertension - Siblings, No family history of Hereditary Spherocytosis, Kidney Disease, Lung Disease, Seizures, Stroke, Thyroid Problems, Tuberculosis. Social History Never smoker, Marital Status - Married, Alcohol Use - Never, Drug Use - No History, Caffeine Use - Daily. Review of  Systems (ROS) Constitutional Symptoms (General Health)  Denies complaints or symptoms of Fever, Chills. Respiratory The patient has no complaints or symptoms. Cardiovascular Complains or has symptoms of LE edema. Psychiatric The patient has no complaints or symptoms. Objective Constitutional Chronically ill appearing but in no apparent acute distress. Vitals Time Taken: 10:12 AM, Height: 64 in, Weight: 209 lbs, BMI: 35.9, Temperature: 98.2 F, Pulse: 62 bpm, Respiratory Rate: 18 breaths/min, Blood Pressure: 112/52 mmHg. [name], [name] ([id number]) Respiratory normal breathing without difficulty. clear to auscultation bilaterally. Cardiovascular regular rate and rhythm with normal S1, S2. 1+ pitting edema of the bilateral lower extremities. Psychiatric this patient is able to make decisions and demonstrates good insight into disease process. Alert and Oriented x 3. pleasant and cooperative. General Notes: Patient's wound showed an excellent surface granular at this point and there is no evidence of slough noted definitely no need for debridement. She did bring her Juxta-Lite with her today but that was not needed. Integumentary (Hair, Skin) Wound #1 status is Open. Original cause of wound was Gradually Appeared. The wound is located on the Left,Lateral Lower Leg. The wound measures 0.5cm length x 0.6cm width x 0.1cm depth; 0.236cm^2 area and 0.024cm^3 volume. There is Fat Layer (Subcutaneous Tissue) Exposed exposed. There is no tunneling or undermining noted. There is a large amount of serosanguineous drainage noted. The wound margin is flat and intact. There is large (67-100%) red granulation within the wound bed. There is a small (1-33%) amount of necrotic tissue within the wound bed including Eschar and Adherent Slough. The periwound skin appearance did not exhibit: Callus, Crepitus, Excoriation, Induration, Rash, Scarring, Dry/Scaly, Maceration, Atrophie Blanche, Cyanosis,  Ecchymosis, Hemosiderin Staining, Mottled, Pallor, Rubor, Erythema. Periwound temperature was noted as No Abnormality. The periwound has tenderness on palpation. Wound #11 status is Healed - Epithelialized. Original cause of wound was Gradually Appeared. The wound is located on the Left,Medial Lower Leg. The wound measures 0cm length x 0cm width x 0cm depth; 0cm^2 area and 0cm^3 volume. There is no tunneling or undermining noted. There is a none present amount of drainage noted. The wound margin is flat and intact. There is no granulation within the wound bed. There is no necrotic tissue within the wound bed. Wound #13 status is Healed - Epithelialized. Original cause of wound was Blister. The wound is located on the Left Malleolus. The wound measures 0cm length x 0cm width x 0cm depth; 0cm^2 area and 0cm^3 volume. There is no tunneling or undermining noted. There is a none present amount of drainage noted. The wound margin is flat and intact. There is no granulation within the wound bed. There is no necrotic tissue within the wound bed. The periwound skin appearance did not exhibit: Callus, Crepitus, Excoriation, Induration, Rash, Scarring, Dry/Scaly, Maceration, Atrophie Blanche, Cyanosis, Ecchymosis, Hemosiderin Staining, Mottled, Pallor, Rubor, Erythema. Periwound temperature was noted as No Abnormality. Assessment Active Problems ICD-10 E11.622 - Type 2 diabetes mellitus with other skin ulcer I89.0 - Lymphedema, not elsewhere classified L97.212 - Non-pressure chronic ulcer of right calf with fat layer exposed L97.222 - Non-pressure chronic ulcer of left calf with fat layer exposed Procedures Wound #1 [name], Jillian B. ([id number]) Pre-procedure diagnosis of Wound #1 is a Diabetic Wound/Ulcer of the Lower Extremity located on the Left,Lateral Lower Leg . There was a Three Layer Compression Therapy Procedure by Jillian Barman, RN. Post procedure Diagnosis Wound #1: Same as  Pre-Procedure Plan Wound Cleansing: Wound #1 Left,Lateral Lower Leg: Clean wound with Normal Saline. Cleanse wound with mild soap and water May Shower,  gently pat wound dry prior to applying new dressing. Anesthetic (add to Medication List): Wound #1 Left,Lateral Lower Leg: Topical Lidocaine 4% cream applied to wound bed prior to debridement (In Clinic Only). Primary Wound Dressing: Wound #1 Left,Lateral Lower Leg: Hydrafera Blue - Please make sure this is Hydrafera Blue Ready Transfer Secondary Dressing: Wound #1 Left,Lateral Lower Leg: ABD pad Dressing Change Frequency: Wound #1 Left,Lateral Lower Leg: Change Dressing Monday, Wednesday, Friday - HHRN to change Monday, Wednesday and Fridays and pt to come to [facility] on Fridays. Follow-up Appointments: Wound #1 Left,Lateral Lower Leg: Return Appointment in 1 week. Edema Control: Wound #1 Left,Lateral Lower Leg: 3 Layer Compression System - Left Lower Extremity - ****Make sure you are only putting a 3-layer wrap on the left leg**** wrap up to 3 finger widths below the knee. Patient to wear own Juxtalite/Juzo compression garment. - Please educate patient and spouse about juxtalite application and use Elevate legs to the level of the heart and pump ankles as often as possible Additional Orders / Instructions: Wound #1 Left,Lateral Lower Leg: Vitamin A; Vitamin C, Zinc Increase protein intake. Home Health: Wound #1 Left,Lateral Lower Leg: [place] Nurse may visit PRN to address patient s wound care needs. FACE TO FACE ENCOUNTER: MEDICARE and MEDICAID PATIENTS: I certify that this patient is under my care and that I had a face-to-face encounter that meets the physician face-to-face encounter requirements with this patient on this date. The encounter with the patient was in whole or in part for the following MEDICAL CONDITION: (primary reason for [place]) MEDICAL NECESSITY: I  certify, that based on my findings, NURSING services are a medically necessary home health service. HOME BOUND STATUS: I certify that my clinical findings support that this patient is homebound (i.e., Due to illness or injury, pt requires aid of supportive devices such as crutches, cane, wheelchairs, walkers, the use of special transportation or the assistance of another person to leave their place of residence. There is a normal inability to leave the home and doing so requires considerable and taxing effort. Other absences are for medical reasons / religious services and are infrequent or of short duration when for other reasons). If current dressing causes regression in wound condition, may D/C ordered dressing product/s and apply Normal Saline Moist Dressing daily until next [place] / Other MD appointment. [place] of regression in wound condition at [phone]. [name], [name] ([id number]) Please direct any NON-WOUND related issues/requests for orders to patient's Primary Care Physician I'm gonna see were things stand at follow-up in one weeks time. We will continue with the Current wound care measures and she seems to done very well with this and I'm hopeful that she will again continue to show signs of improvement week by week. In fact I'm hopeful in the next 1-2 weeks this will completely healed we will at that point transition her to the Juxta-Lite compression only. Patient is in agreement with the plan. Otherwise it was a pleasure seeing her today. Please see above for specific wound care orders. We will see patient for re-evaluation in 1 week(s) here in the clinic. If anything worsens or changes patient will contact our office for additional recommendations. Electronic Signature(s) Signed: 03/03/2017 10:02:55 PM By: Jillian Keeler PA-C Entered By: [name] on 03/02/2017 13:23:56 [name]  ([id number]) -------------------------------------------------------------------------------- ROS/PFSH Details Patient Name: EMILLIA, WEATHERLY B. Date of Service: 03/02/2017 10:00 AM Medical Record Number: [id number]  Patient Account Number: [id number] Date of Birth/Sex: 1945/01/05 (72 y.o. Female) Treating RN: [name] Primary Care Provider: Lelon Watson Other Clinician: Referring Provider: Lelon Watson Treating Provider/Extender: [name], Jannelle Notaro Weeks in Watson: 21 Information Obtained From Patient Wound History Do you currently have one or more open woundso Yes How many open wounds do you currently haveo 6 Approximately how long have you had your woundso 2 months How have you been treating your wound(s) until nowo [place] Has your wound(s) ever healed and then re-openedo No Have you had any lab work done in the past montho No Have you tested positive for an antibiotic resistant organism (MRSA, VRE)o No Have you tested positive for osteomyelitis (bone infection)o No Have you had any tests for circulation on your legso Yes Where was the test [facility] Have you had other problems associated with your woundso Infection, Swelling Constitutional Symptoms (General Health) Complaints and Symptoms: Negative for: Fever; Chills Cardiovascular Complaints and Symptoms: Positive for: LE edema Medical History: Positive for: Congestive Heart Failure; Coronary Artery Disease; Hypertension Respiratory Complaints and Symptoms: No Complaints or Symptoms Medical History: Positive for: Asthma Endocrine Medical History: Positive for: Type II Diabetes Time with diabetes: a long time Treated with: Oral agents Blood sugar tested every day: No Musculoskeletal [name], [name] ([id number]) Medical History: Positive for: Osteoarthritis Psychiatric Complaints and Symptoms: No Complaints or Symptoms Immunizations Pneumococcal Vaccine: Received Pneumococcal Vaccination: Yes Implantable  Devices Family and Social History Cancer: Yes - Mother,Siblings; Diabetes: Yes - Mother,Siblings; Heart Disease: Yes - Mother; Hereditary Spherocytosis: No; Hypertension: Yes - Siblings; Kidney Disease: No; Lung Disease: No; Seizures: No; Stroke: No; Thyroid Problems: No; Tuberculosis: No; Never smoker; Marital Status - Married; Alcohol Use: Never; Drug Use: No History; Caffeine Use: Daily; Financial Concerns: No; Food, Clothing or Shelter Needs: No; Support System Lacking: No; Transportation Concerns: No; Advanced Directives: No; Patient does not want information on Advanced Directives; Do not resuscitate: No; Living Will: No; Medical Power of Attorney: No Physician Affirmation I have reviewed and agree with the above information. Electronic Signature(s) Signed: 03/03/2017 10:02:55 PM By: Jillian Keeler PA-C Signed: 03/05/2017 10:40:22 AM By: Gretta Cool, BSN, RN, CWS, Kim RN, BSN Entered By: [name] on 03/02/2017 13:22:50 [name], [name] ([id number]) -------------------------------------------------------------------------------- SuperBill Details Patient Name: [name], [place]. Date of Service: 03/02/2017 Medical Record Number: [id number] Patient Account Number: [id number] Date of Birth/Sex: [birth year] (72 y.o. 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 Facility Procedures CPT4 Code: 67544920 Description: (Facility Use Only) [phone] - [place] LWR LT LEG Modifier: Quantity: 1 Physician Procedures CPT4 Code: 9758832 Description: 54982 - WC PHYS  LEVEL 3 - EST PT ICD-10 Diagnosis Description E11.622 Type 2 diabetes mellitus with other skin ulcer I89.0 Lymphedema, not elsewhere classified L97.212 Non-pressure chronic ulcer of right calf with fat layer exp L97.222  Non-pressure chronic ulcer of left calf with fat layer expo Modifier: osed sed Quantity: 1 Electronic Signature(s) Signed: 03/03/2017 10:02:55 PM By: Jillian Keeler PA-C Entered By: [name] on 03/02/2017 13:24:15

## 2017-03-05 NOTE — Progress Notes (Signed)
XELA, OREGEL ([id number]) Visit Report for 03/02/2017 Arrival Information Details Patient Name: [name], [name]. Date of Service: 03/02/2017 10:00 AM Medical Record Number: [id number] Patient Account Number: [id number] Date of Birth/Sex: 1945-10-10 (72 y.o. Female) Treating RN: Ahmed Prima Primary Care Lyndsie Wallman: Lelon Huh Other Clinician: Referring Evella Kasal: Lelon Huh Treating Cally Nygard/Extender: Melburn Hake, HOYT Weeks in Treatment: 21 Visit Information History Since Last Visit All ordered tests and consults were completed: No Patient Arrived: Wheel Chair Added or deleted any medications: No Arrival Time: 09:58 Any new allergies or adverse reactions: No Accompanied By: husband Had a fall or experienced change in No Transfer Assistance: EasyPivot Patient activities of daily living that may affect Lift risk of falls: Patient Identification Verified: Yes Signs or symptoms of abuse/neglect since last visito No Secondary Verification Process Yes Hospitalized since last visit: No Completed: Has Dressing in Place as Prescribed: Yes Patient Requires Transmission-Based No Precautions: Has Compression in Place as Prescribed: Yes Patient Has Alerts: Yes Pain Present Now: No Electronic Signature(s) Signed: 03/02/2017 5:53:16 PM By: Alric Quan Entered By: Alric Quan on 03/02/2017 Cobb, [name] ([id number]) -------------------------------------------------------------------------------- Compression Therapy Details Patient Name: Jillian Albe B. Date of Service: 03/02/2017 10:00 AM Medical Record Number: [id number] Patient Account Number: [id number] Date of Birth/Sex: 1945/05/18 (72 y.o. Female) Treating RN: Cornell Barman Primary Care Alfonso Carden: Lelon Huh Other Clinician: Referring Cleofas Hudgins: Lelon Huh Treating Cystal Shannahan/Extender: Melburn Hake, HOYT Weeks in Treatment: 21 Compression Therapy Performed for Wound Assessment: Wound #1 Left,Lateral Lower  Leg Performed By: Clinician Cornell Barman, RN Compression Type: Three Layer Post Procedure Diagnosis Same as Pre-procedure Electronic Signature(s) Signed: 03/05/2017 10:40:22 AM By: Gretta Cool, BSN, RN, CWS, Kim RN, BSN Entered By: Gretta Cool, BSN, RN, CWS, Kim on 03/02/2017 10:24:48 [name] ([id number]) -------------------------------------------------------------------------------- Encounter Discharge Information Details Patient Name: [name], [name]. Date of Service: 03/02/2017 10:00 AM Medical Record Number: [id number] Patient Account Number: [id number] Date of Birth/Sex: [birth year] (72 y.o. Female) Treating RN: Ahmed Prima Primary Care Garlen Reinig: Lelon Huh Other Clinician: Referring Rosangela Fehrenbach: Lelon Huh Treating Thressa Shiffer/Extender: Melburn Hake, HOYT Weeks in Treatment: 79 Encounter Discharge Information Items Discharge Pain Level: 0 Discharge Condition: Stable Ambulatory Status: Wheelchair Discharge Destination: Home Transportation: Private Auto Accompanied By: husband Schedule Follow-up Appointment: Yes Medication Reconciliation completed and No provided to Patient/Care Marylu Dudenhoeffer: Provided on Clinical Summary of Care: 03/02/2017 Form Type Recipient Paper Patient CS Electronic Signature(s) Signed: 03/05/2017 9:00:53 AM By: Ruthine Dose Entered By: Ruthine Dose on 03/02/2017 10:46:11 [name] ([id number]) -------------------------------------------------------------------------------- Lower Extremity Assessment Details Patient Name: Jillian Albe B. Date of Service: 03/02/2017 10:00 AM Medical Record Number: [id number] Patient Account Number: [id number] Date of Birth/Sex: 01/09/1945 (72 y.o. Female) Treating RN: Ahmed Prima Primary Care Helga Asbury: Lelon Huh Other Clinician: Referring Teigan Manner: Lelon Huh Treating Cashius Grandstaff/Extender: Melburn Hake, HOYT Weeks in Treatment: 21 Edema Assessment Assessed: [Left: No] [Right: No] [Left: Edema] [Right:  :] Calf Left: Right: Point of Measurement: 30 cm From Medial Instep 39.2 cm cm Ankle Left: Right: Point of Measurement: 10 cm From Medial Instep 22.5 cm cm Vascular Assessment Pulses: Dorsalis Pedis Palpable: [Left:Yes] Posterior Tibial Extremity colors, hair growth, and conditions: Extremity Color: [Left:Normal] Temperature of Extremity: [Left:Warm] Capillary Refill: [Left:< 3 seconds] Toe Nail Assessment Left: Right: Thick: Yes Discolored: Yes Deformed: Yes Improper Length and Hygiene: Yes Electronic Signature(s) Signed: 03/02/2017 5:53:16 PM By: Alric Quan Entered By: Alric Quan on 03/02/2017 10:15:34 [name] ([id number]) -------------------------------------------------------------------------------- Multi Wound Chart Details Patient Name: Jillian Albe B. Date of Service: 03/02/2017 10:00 AM Medical  Record Number: [id number] Patient Account Number: [id number] Date of Birth/Sex: 03/30/1945 (72 y.o. Female) Treating RN: Cornell Barman Primary Care Carlesha Seiple: Lelon Huh Other Clinician: Referring Chance Munter: Lelon Huh Treating Tamana Hatfield/Extender: Melburn Hake, HOYT Weeks in Treatment: 21 Vital Signs Height(in): 33 Pulse(bpm): 38 Weight(lbs): 209 Blood Pressure(mmHg): 112/52 Body Mass Index(BMI): 36 Temperature(F): 98.2 Respiratory Rate 18 (breaths/min): Photos: [1:No Photos] [11:No Photos] [13:No Photos] Wound Location: [1:Left Lower Leg - Lateral] [11:Left Lower Leg - Medial] [13:Left Malleolus] Wounding Event: [1:Gradually Appeared] [11:Gradually Appeared] [13:Blister] Primary Etiology: [1:Diabetic Wound/Ulcer of the Diabetic Wound/Ulcer of the Lymphedema Lower Extremity] [11:Lower Extremity] Comorbid History: [1:Asthma, Congestive Heart Failure, Coronary Artery Disease, Hypertension, Type II Disease, Hypertension, Type II Disease, Hypertension, Type II Diabetes, Osteoarthritis] [11:Asthma, Congestive Heart Failure, Coronary Artery Diabetes,   Osteoarthritis] [13:Asthma, Congestive Heart Failure, Coronary Artery Diabetes, Osteoarthritis] Date Acquired: [1:08/02/2016] [11:01/26/2017] [13:02/23/2017] Weeks of Treatment: [1:21] [11:5] [13:1] Wound Status: [1:Open] [11:Healed - Epithelialized] [13:Healed - Epithelialized] Clustered Wound: [1:No] [11:Yes] [13:No] Measurements L x W x D [1:0.5x0.6x0.1] [11:0x0x0] [13:0x0x0] (cm) Area (cm) : [1:0.236] [11:0] [13:0] Volume (cm) : [1:0.024] [11:0] [13:0] % Reduction in Area: [1:98.60%] [11:100.00%] [13:100.00%] % Reduction in Volume: [1:98.60%] [11:100.00%] [13:100.00%] Classification: [1:Grade 2] [11:Grade 2] [13:Partial Thickness] Exudate Amount: [1:Large] [11:None Present] [13:None Present] Exudate Type: [1:Serosanguineous] [11:N/A] [13:N/A] Exudate Color: [1:red, brown] [11:N/A] [13:N/A] Wound Margin: [1:Flat and Intact] [11:Flat and Intact] [13:Flat and Intact] Granulation Amount: [1:Large (67-100%)] [11:None Present (0%)] [13:None Present (0%)] Granulation Quality: [1:Red] [11:N/A] [13:N/A] Necrotic Amount: [1:Small (1-33%)] [11:None Present (0%)] [13:None Present (0%)] Necrotic Tissue: [1:Eschar, Adherent Slough] [11:N/A] [13:N/A] Exposed Structures: [1:Fat Layer (Subcutaneous Tissue) Exposed: Yes Fascia: No Tendon: No Muscle: No Joint: No Bone: No] [11:N/A] [13:N/A] Epithelialization: [1:Medium (34-66%)] [11:Large (67-100%)] [13:Large (67-100%)] Periwound Skin Texture: [11:No Abnormalities Noted] Excoriation: No Excoriation: No Induration: No Induration: No Callus: No Callus: No Crepitus: No Crepitus: No Rash: No Rash: No Scarring: No Scarring: No Periwound Skin Moisture: Maceration: No No Abnormalities Noted Maceration: No Dry/Scaly: No Dry/Scaly: No Periwound Skin Color: Atrophie Blanche: No No Abnormalities Noted Atrophie Blanche: No Cyanosis: No Cyanosis: No Ecchymosis: No Ecchymosis: No Erythema: No Erythema: No Hemosiderin Staining: No Hemosiderin  Staining: No Mottled: No Mottled: No Pallor: No Pallor: No Rubor: No Rubor: No Temperature: No Abnormality N/A No Abnormality Tenderness on Palpation: Yes No No Wound Preparation: Ulcer Cleansing: Other: soap Ulcer Cleansing: Ulcer Cleansing: and water Rinsed/Irrigated with Saline Rinsed/Irrigated with Saline Topical Anesthetic Applied: Topical Anesthetic Applied: Other: lidocaine 4% None Treatment Notes Electronic Signature(s) Signed: 03/05/2017 10:40:22 AM By: Gretta Cool, BSN, RN, CWS, Kim RN, BSN Entered By: Gretta Cool, BSN, RN, CWS, Kim on 03/02/2017 10:22:07 [name] ([id number]) -------------------------------------------------------------------------------- [place] Details Patient Name: [name], [name]. Date of Service: 03/02/2017 10:00 AM Medical Record Number: [id number] Patient Account Number: [id number] Date of Birth/Sex: [birth year] (72 y.o. Female) Treating RN: Cornell Barman Primary Care Laterica [name]: Lelon Huh Other Clinician: Referring Feather Berrie: Lelon Huh Treating Daoud Lobue/Extender: Melburn Hake, HOYT Weeks in Treatment: 21 Active Inactive ` Abuse / Safety / Falls / Self Care Management Nursing Diagnoses: History of Falls Potential for falls Goals: Patient will not experience any injury related to falls Date Initiated: 10/02/2016 Target Resolution Date: 01/06/2017 Goal Status: Active Interventions: Assess: immobility, friction, shearing, incontinence upon admission and as needed Assess impairment of mobility on admission and as needed per policy Assess personal safety and home safety (as indicated) on admission and as needed Notes: ` Nutrition Nursing Diagnoses: Imbalanced nutrition Impaired glucose control: actual or potential Potential for  alteratiion in Nutrition/Potential for imbalanced nutrition Goals: Patient/caregiver agrees to and verbalizes understanding of need to use nutritional supplements and/or vitamins as prescribed Date  Initiated: 10/02/2016 Target Resolution Date: 02/10/2017 Goal Status: Active Patient/caregiver will maintain therapeutic glucose control Date Initiated: 10/02/2016 Target Resolution Date: 01/06/2017 Goal Status: Active Interventions: Assess patient nutrition upon admission and as needed per policy Provide education on elevated blood sugars and impact on wound healing Provide education on nutrition Treatment Activities: Education provided on Nutrition : 10/02/2016 [name], [name] ([id number]) Notes: ` Orientation to the Wound Care Program Nursing Diagnoses: Knowledge deficit related to the wound healing center program Goals: Patient/caregiver will verbalize understanding of the [facility] Program Date Initiated: 10/02/2016 Target Resolution Date: 11/11/2016 Goal Status: Active Interventions: Provide education on orientation to the wound center Notes: ` Pain, Acute or Chronic Nursing Diagnoses: Pain, acute or chronic: actual or potential Potential alteration in comfort, pain Goals: Patient/caregiver will verbalize adequate pain control between visits Date Initiated: 10/02/2016 Target Resolution Date: 02/10/2017 Goal Status: Active Interventions: Complete pain assessment as per visit requirements Notes: ` Wound/Skin Impairment Nursing Diagnoses: Impaired tissue integrity Knowledge deficit related to ulceration/compromised skin integrity Goals: Ulcer/skin breakdown will have a volume reduction of 80% by week 12 Date Initiated: 10/02/2016 Target Resolution Date: 02/03/2017 Goal Status: Active Interventions: Assess patient/caregiver ability to perform ulcer/skin care regimen upon admission and as needed Assess ulceration(s) every visit Notes: [name], [name] ([id number]) Electronic Signature(s) Signed: 03/05/2017 10:40:22 AM By: Gretta Cool, BSN, RN, CWS, Kim RN, BSN Entered By: Gretta Cool, BSN, RN, CWS, Kim on 03/02/2017 10:21:47 [name]  ([id number]) -------------------------------------------------------------------------------- Pain Assessment Details Patient Name: [name], Jillian B. Date of Service: 03/02/2017 10:00 AM Medical Record Number: [id number] Patient Account Number: [id number] Date of Birth/Sex: May 16, 1945 (72 y.o. Female) Treating RN: Ahmed Prima Primary Care Anneka Studer: Lelon Huh Other Clinician: Referring Basha Krygier: Lelon Huh Treating Trayvon Trumbull/Extender: Melburn Hake, HOYT Weeks in Treatment: 21 Active Problems Location of Pain Severity and Description of Pain Patient Has Paino No Site Locations Pain Management and Medication Current Pain Management: Electronic Signature(s) Signed: 03/02/2017 5:53:16 PM By: Alric Quan Entered By: Alric Quan on 03/02/2017 09:59:09 [name] ([id number]) -------------------------------------------------------------------------------- Patient/Caregiver Education Details Patient Name: [name]. Date of Service: 03/02/2017 10:00 AM Medical Record Number: [id number] Patient Account Number: [id number] Date of Birth/Gender: [birth year] (72 y.o. Female) Treating RN: Ahmed Prima Primary Care Physician: Lelon Huh Other Clinician: Referring Physician: Lelon Huh Treating Physician/Extender: Sharalyn Ink in Treatment: 21 Education Assessment Education Provided To: Patient Education Topics Provided Wound/Skin Impairment: Handouts: Caring for Your Ulcer, Other: do not get wrap wet Methods: Demonstration, Explain/Verbal Responses: State content correctly Electronic Signature(s) Signed: 03/02/2017 5:53:16 PM By: Alric Quan Entered By: Alric Quan on 03/02/2017 10:41:00 [name] ([id number]) -------------------------------------------------------------------------------- Wound Assessment Details Patient Name: Jillian Albe B. Date of Service: 03/02/2017 10:00 AM Medical Record Number: [id number] Patient Account Number:  [id number] Date of Birth/Sex: 1945/12/06 (72 y.o. Female) Treating RN: Ahmed Prima Primary Care Jequan Shahin: Lelon Huh Other Clinician: Referring Boyce Keltner: Lelon Huh Treating Kaliegh Willadsen/Extender: Melburn Hake, HOYT Weeks in Treatment: 21 Wound Status Wound Number: 1 Primary Diabetic Wound/Ulcer of the Lower Extremity Etiology: Wound Location: Left Lower Leg - Lateral Wound Open Wounding Event: Gradually Appeared Status: Date Acquired: 08/02/2016 Comorbid Asthma, Congestive Heart Failure, Coronary Weeks Of Treatment: 21 History: Artery Disease, Hypertension, Type II Diabetes, Clustered Wound: No Osteoarthritis Photos Photo Uploaded By: Roger Shelter on 03/02/2017 17:49:27 Wound Measurements Length: (cm) 0.5 Width: (cm) 0.6 Depth: (cm)  0.1 Area: (cm) 0.236 Volume: (cm) 0.024 % Reduction in Area: 98.6% % Reduction in Volume: 98.6% Epithelialization: Medium (34-66%) Tunneling: No Undermining: No Wound Description Classification: Grade 2 Wound Margin: Flat and Intact Exudate Amount: Large Exudate Type: Serosanguineous Exudate Color: red, brown Foul Odor After Cleansing: No Slough/Fibrino Yes Wound Bed Granulation Amount: Large (67-100%) Exposed Structure Granulation Quality: Red Fascia Exposed: No Necrotic Amount: Small (1-33%) Fat Layer (Subcutaneous Tissue) Exposed: Yes Necrotic Quality: Eschar, Adherent Slough Tendon Exposed: No Muscle Exposed: No Joint Exposed: No Bone Exposed: No Periwound Skin Texture [name], Jillian B. ([id number]) Texture Color No Abnormalities Noted: No No Abnormalities Noted: No Callus: No Atrophie Blanche: No Crepitus: No Cyanosis: No Excoriation: No Ecchymosis: No Induration: No Erythema: No Rash: No Hemosiderin Staining: No Scarring: No Mottled: No Pallor: No Moisture Rubor: No No Abnormalities Noted: No Dry / Scaly: No Temperature / Pain Maceration: No Temperature: No Abnormality Tenderness on Palpation:  Yes Wound Preparation Ulcer Cleansing: Other: soap and water, Topical Anesthetic Applied: Other: lidocaine 4%, Treatment Notes Wound #1 (Left, Lateral Lower Leg) 1. Cleansed with: Clean wound with Normal Saline Cleanse wound with antibacterial soap and water 2. Anesthetic Topical Lidocaine 4% cream to wound bed prior to debridement 4. Dressing Applied: Hydrafera Blue 5. Secondary Dressing Applied ABD Pad 7. Secured with Tape 3 Layer Compression System - Left Lower Extremity Notes unna to anchor Electronic Signature(s) Signed: 03/02/2017 5:53:16 PM By: Alric Quan Entered By: Alric Quan on 03/02/2017 10:17:46 [name] ([id number]) -------------------------------------------------------------------------------- Wound Assessment Details Patient Name: [name], Jillian B. Date of Service: 03/02/2017 10:00 AM Medical Record Number: [id number] Patient Account Number: [id number] Date of Birth/Sex: 01-Jun-1945 (72 y.o. Female) Treating RN: Carolyne Fiscal, Debi Primary Care Lina Hitch: Lelon Huh Other Clinician: Referring Babs Dabbs: Lelon Huh Treating Earlyn Sylvan/Extender: Melburn Hake, HOYT Weeks in Treatment: 21 Wound Status Wound Number: 11 Primary Diabetic Wound/Ulcer of the Lower Extremity Etiology: Wound Location: Left Lower Leg - Medial Wound Healed - Epithelialized Wounding Event: Gradually Appeared Status: Date Acquired: 01/26/2017 Comorbid Asthma, Congestive Heart Failure, Coronary Weeks Of Treatment: 5 History: Artery Disease, Hypertension, Type II Diabetes, Clustered Wound: Yes Osteoarthritis Photos Photo Uploaded By: Roger Shelter on 03/02/2017 17:54:49 Wound Measurements Length: (cm) 0 % Width: (cm) 0 % Depth: (cm) 0 Ep Area: (cm) 0 T Volume: (cm) 0 U Reduction in Area: 100% Reduction in Volume: 100% ithelialization: Large (67-100%) unneling: No ndermining: No Wound Description Classification: Grade 2 Wound Margin: Flat and Intact Exudate  Amount: None Present Foul Odor After Cleansing: No Slough/Fibrino No Wound Bed Granulation Amount: None Present (0%) Necrotic Amount: None Present (0%) Periwound Skin Texture Texture Color No Abnormalities Noted: No No Abnormalities Noted: No Moisture No Abnormalities Noted: No Wound Preparation Ulcer Cleansing: Rinsed/Irrigated with Saline [name], [name] ([id number]) Electronic Signature(s) Signed: 03/02/2017 5:53:16 PM By: Alric Quan Entered By: Alric Quan on 03/02/2017 10:11:55 [name] ([id number]) -------------------------------------------------------------------------------- Wound Assessment Details Patient Name: Jillian Albe B. Date of Service: 03/02/2017 10:00 AM Medical Record Number: [id number] Patient Account Number: [id number] Date of Birth/Sex: 02-Sep-1945 (72 y.o. Female) Treating RN: Ahmed Prima Primary Care Geremiah Fussell: Lelon Huh Other Clinician: Referring Kynslie Ringle: Lelon Huh Treating Leilanni Halvorson/Extender: Melburn Hake, HOYT Weeks in Treatment: 21 Wound Status Wound Number: 13 Primary Lymphedema Etiology: Wound Location: Left Malleolus Wound Healed - Epithelialized Wounding Event: Blister Status: Date Acquired: 02/23/2017 Comorbid Asthma, Congestive Heart Failure, Coronary Weeks Of Treatment: 1 History: Artery Disease, Hypertension, Type II Diabetes, Clustered Wound: No Osteoarthritis Photos Photo Uploaded By: Roger Shelter on 03/02/2017 17:55:01  Wound Measurements Length: (cm) 0 Width: (cm) 0 Depth: (cm) 0 Area: (cm) 0 Volume: (cm) 0 % Reduction in Area: 100% % Reduction in Volume: 100% Epithelialization: Large (67-100%) Tunneling: No Undermining: No Wound Description Classification: Partial Thickness Foul Wound Margin: Flat and Intact Slou Exudate Amount: None Present Odor After Cleansing: No gh/Fibrino No Wound Bed Granulation Amount: None Present (0%) Necrotic Amount: None Present (0%) Periwound Skin  Texture Texture Color No Abnormalities Noted: No No Abnormalities Noted: No Callus: No Atrophie Blanche: No Crepitus: No Cyanosis: No Excoriation: No Ecchymosis: No Induration: No Erythema: No Rash: No Hemosiderin Staining: No [name], GEMMILL B. ([id number]) Scarring: No Mottled: No Pallor: No Moisture Rubor: No No Abnormalities Noted: No Dry / Scaly: No Temperature / Pain Maceration: No Temperature: No Abnormality Wound Preparation Ulcer Cleansing: Rinsed/Irrigated with Saline Topical Anesthetic Applied: None Electronic Signature(s) Signed: 03/02/2017 5:53:16 PM By: Alric Quan Entered By: Alric Quan on 03/02/2017 10:07:40 [name] ([id number]) -------------------------------------------------------------------------------- Vitals Details Patient Name: Jillian Albe B. Date of Service: 03/02/2017 10:00 AM Medical Record Number: [id number] Patient Account Number: [id number] Date of Birth/Sex: [birth year] (72 y.o. Female) Treating RN: Carolyne Fiscal, Debi Primary Care Stacyann Mcconaughy: Lelon Huh Other Clinician: Referring Camran Keady: Lelon Huh Treating Marcellina Jonsson/Extender: Melburn Hake, HOYT Weeks in Treatment: 21 Vital Signs Time Taken: 10:12 Temperature (F): 98.2 Height (in): 64 Pulse (bpm): 62 Weight (lbs): 209 Respiratory Rate (breaths/min): 18 Body Mass Index (BMI): 35.9 Blood Pressure (mmHg): 112/52 Reference Range: 80 - 120 mg / dl Electronic Signature(s) Signed: 03/02/2017 5:53:16 PM By: Alric Quan Entered By: Alric Quan on 03/02/2017 10:14:22

## 2017-03-07 DIAGNOSIS — L97222 Non-pressure chronic ulcer of left calf with fat layer exposed: Secondary | ICD-10-CM | POA: Diagnosis not present

## 2017-03-07 DIAGNOSIS — I110 Hypertensive heart disease with heart failure: Secondary | ICD-10-CM | POA: Diagnosis not present

## 2017-03-07 DIAGNOSIS — J45909 Unspecified asthma, uncomplicated: Secondary | ICD-10-CM | POA: Diagnosis not present

## 2017-03-07 DIAGNOSIS — I429 Cardiomyopathy, unspecified: Secondary | ICD-10-CM | POA: Diagnosis not present

## 2017-03-07 DIAGNOSIS — Z79891 Long term (current) use of opiate analgesic: Secondary | ICD-10-CM | POA: Diagnosis not present

## 2017-03-07 DIAGNOSIS — Z7951 Long term (current) use of inhaled steroids: Secondary | ICD-10-CM | POA: Diagnosis not present

## 2017-03-07 DIAGNOSIS — I272 Pulmonary hypertension, unspecified: Secondary | ICD-10-CM | POA: Diagnosis not present

## 2017-03-07 DIAGNOSIS — I509 Heart failure, unspecified: Secondary | ICD-10-CM | POA: Diagnosis not present

## 2017-03-07 DIAGNOSIS — E11622 Type 2 diabetes mellitus with other skin ulcer: Secondary | ICD-10-CM | POA: Diagnosis not present

## 2017-03-07 DIAGNOSIS — Z4800 Encounter for change or removal of nonsurgical wound dressing: Secondary | ICD-10-CM | POA: Diagnosis not present

## 2017-03-07 DIAGNOSIS — M1990 Unspecified osteoarthritis, unspecified site: Secondary | ICD-10-CM | POA: Diagnosis not present

## 2017-03-07 DIAGNOSIS — Z7984 Long term (current) use of oral hypoglycemic drugs: Secondary | ICD-10-CM | POA: Diagnosis not present

## 2017-03-07 DIAGNOSIS — I2510 Atherosclerotic heart disease of native coronary artery without angina pectoris: Secondary | ICD-10-CM | POA: Diagnosis not present

## 2017-03-07 DIAGNOSIS — L97821 Non-pressure chronic ulcer of other part of left lower leg limited to breakdown of skin: Secondary | ICD-10-CM | POA: Diagnosis not present

## 2017-03-08 ENCOUNTER — Encounter: Payer: Self-pay | Admitting: Family Medicine

## 2017-03-08 ENCOUNTER — Ambulatory Visit (INDEPENDENT_AMBULATORY_CARE_PROVIDER_SITE_OTHER): Payer: Medicare Other | Admitting: Family Medicine

## 2017-03-08 VITALS — BP 130/58 | HR 62 | Temp 97.6°F | Resp 16 | Wt 210.0 lb

## 2017-03-08 DIAGNOSIS — R609 Edema, unspecified: Secondary | ICD-10-CM

## 2017-03-08 DIAGNOSIS — I5022 Chronic systolic (congestive) heart failure: Secondary | ICD-10-CM

## 2017-03-08 DIAGNOSIS — E1121 Type 2 diabetes mellitus with diabetic nephropathy: Secondary | ICD-10-CM | POA: Diagnosis not present

## 2017-03-08 DIAGNOSIS — I10 Essential (primary) hypertension: Secondary | ICD-10-CM

## 2017-03-08 LAB — POCT GLYCOSYLATED HEMOGLOBIN (HGB A1C)
ESTIMATED AVERAGE GLUCOSE: 148
Hemoglobin A1C: 6.8

## 2017-03-08 NOTE — Progress Notes (Signed)
Patient: [name] Female    DOB: 04-23-1945   72 y.o.   MRN: [id number] Visit Date: 03/08/2017  Today's Provider: Lelon Huh, MD   No chief complaint on file.  Subjective:    HPI   Diabetes Mellitus Type II, Follow-up:   Lab Results  Component Value Date   HGBA1C 6.9 (H) 12/21/2016   HGBA1C 7.7 09/21/2016   HGBA1C 7.6 05/18/2016   Last seen for diabetes 3 months ago.  Management since then includes; no changes. She reports good compliance with treatment. She is not having side effects. none Current symptoms include none and have been unchanged. Home blood sugar records: fasting range: not checking  Episodes of hypoglycemia? no   Current Insulin Regimen: n/a Most Recent Eye Exam: 01/03/2017 Weight trend: stable Prior visit with dietician: no Current diet: well balanced Current exercise: none  ----------------------------------------------------------------    Hypertension, follow-up:  BP Readings from Last 3 Encounters:  03/01/17 107/69  02/14/17 140/76  12/28/16 134/81    She was last seen for hypertension 3 months ago.  BP at that visit was 134/81. Management since that visit includes; no changes.She reports good compliance with treatment. She is not having side effects. none She is not exercising. She is adherent to low salt diet.   Outside blood pressures are not cjecking. She is experiencing none.  Patient denies none.   Cardiovascular risk factors include diabetes mellitus.  Use of agents associated with hypertension: none.   ----------------------------------------------------------------   Chronic systolic heart failure ([place]) From 12/21/2016-no changes. She reports she has been much more compliant with keeping legs elevated and swelling has greatly improved. Reports that leg wounds have nearly healed since swelling has improved. Has been able to ambulate more and walks some at the [employer] every week. Still has pain in her legs,  but have improved since wounds on legs have healed.    Allergies  Allergen Reactions  . Hydrochlorothiazide     Leg Cramps  . Penicillins Rash     Current Outpatient Medications:  .  amLODipine (NORVASC) 5 MG tablet, TAKE 1 TABLET (5 MG TOTAL) BY MOUTH DAILY., Disp: 30 tablet, Rfl: 5 .  aspirin EC 81 MG tablet, Take 1 tablet (81 mg total) by mouth daily., Disp: 90 tablet, Rfl: 3 .  cyanocobalamin 500 MCG tablet, Take 500 mcg by mouth daily., Disp: , Rfl:  .  fluticasone furoate-vilanterol (BREO ELLIPTA) 200-25 MCG/INH AEPB, Inhale 1 puff into the lungs daily. FOR ASTHMA J45.20 (Patient not taking: Reported on 12/28/2016), Disp: 1 each, Rfl: 5 .  furosemide (LASIX) 40 MG tablet, Take 1 tablet (40 mg total) by mouth daily., Disp: 90 tablet, Rfl: 3 .  HYDROcodone-acetaminophen (NORCO) 10-325 MG tablet, Take 1 tablet by mouth every 8 (eight) hours as needed. (Patient not taking: Reported on 12/28/2016), Disp: 90 tablet, Rfl: 0 .  lisinopril (PRINIVIL,ZESTRIL) 20 MG tablet, Take 1 tablet (20 mg total) daily by mouth., Disp: 90 tablet, Rfl: 3 .  lovastatin (MEVACOR) 20 MG tablet, TAKE 1 TABLET (20 MG TOTAL) BY MOUTH AT BEDTIME., Disp: 90 tablet, Rfl: 4 .  meloxicam (MOBIC) 15 MG tablet, Take 1 tablet (15 mg total) by mouth daily as needed for pain. Take with food (Patient not taking: Reported on 12/28/2016), Disp: 90 tablet, Rfl: 4 .  metFORMIN (GLUCOPHAGE-XR) 750 MG 24 hr tablet, TAKE 2 TABLETS (1,500 MG TOTAL) BY MOUTH DAILY., Disp: 180 tablet, Rfl: 3 .  metoprolol tartrate (LOPRESSOR) 50  MG tablet, Take 1 tablet (50 mg total) by mouth 2 (two) times daily., Disp: 180 tablet, Rfl: 3 .  silver sulfADIAZINE (SILVADENE) 1 % cream, APPLY TO AFFECTED AREA DAILY, Disp: 85 g, Rfl: 5 .  spironolactone (ALDACTONE) 25 MG tablet, TAKE 1 TABLET (25 MG TOTAL) BY MOUTH DAILY., Disp: 30 tablet, Rfl: 12 .  traZODone (DESYREL) 100 MG tablet, TAKE 1 TABLET BY MOUTH EVERY NIGHT AT BEDTIME AS NEEDED FOR INSOMNIA,  Disp: 90 tablet, Rfl: 4  Review of Systems  Constitutional: Negative for appetite change, chills, fatigue and fever.  Respiratory: Negative for chest tightness and shortness of breath.   Cardiovascular: Negative for chest pain and palpitations.  Gastrointestinal: Negative for abdominal pain, nausea and vomiting.  Neurological: Negative for dizziness and weakness.    Social History   Tobacco Use  . Smoking status: Never Smoker  . Smokeless tobacco: Never Used  Substance Use Topics  . Alcohol use: No   Objective:   BP (!) 130/58 (BP Location: Right Arm, Patient Position: Sitting, Cuff Size: Large)   Pulse 62   Temp 97.6 F (36.4 C) (Oral)   Resp 16   Wt 210 lb (95.3 kg)   SpO2 99%   BMI 38.41 kg/m     Physical Exam  General Appearance:    Alert, cooperative, no distress, obese  Eyes:    PERRL, conjunctiva/corneas clear, EOM's intact       Lungs:     Clear to auscultation bilaterally, respirations unlabored  Heart:    Regular rate and rhythm, trace ankle edema.   Neurologic:   Awake, alert, oriented x 3. No apparent focal neurological           defect.       A1c=6.8%    Assessment & Plan:     1. Diabetes mellitus with nephropathy ([place]) Well controlled.  Continue current medications.   - POCT glycosylated hemoglobin (Hb A1C)  2. Essential hypertension Well controlled.  Continue current medications.    3. Chronic systolic heart failure ([place]) Off Entresto due to cost, but stablel on ACEI, diuretics and spirolactone. Not requiring potassium supplementation. Continue current medications.  Follow up Dr. Saunders Revel 03/14/2017 as scheduled.   4. Edema, unspecified type Much better since she has been elevated legs consistently. Encourage increase physical activity as tolerated.    Follow up diabetes in 4 months.         Lelon Huh, MD  [facility]

## 2017-03-09 ENCOUNTER — Encounter: Payer: Medicare Other | Admitting: Physician Assistant

## 2017-03-09 DIAGNOSIS — E1151 Type 2 diabetes mellitus with diabetic peripheral angiopathy without gangrene: Secondary | ICD-10-CM | POA: Diagnosis not present

## 2017-03-09 DIAGNOSIS — L97222 Non-pressure chronic ulcer of left calf with fat layer exposed: Secondary | ICD-10-CM | POA: Diagnosis not present

## 2017-03-09 DIAGNOSIS — M1990 Unspecified osteoarthritis, unspecified site: Secondary | ICD-10-CM | POA: Diagnosis not present

## 2017-03-09 DIAGNOSIS — I2510 Atherosclerotic heart disease of native coronary artery without angina pectoris: Secondary | ICD-10-CM | POA: Diagnosis not present

## 2017-03-09 DIAGNOSIS — J45909 Unspecified asthma, uncomplicated: Secondary | ICD-10-CM | POA: Diagnosis not present

## 2017-03-09 DIAGNOSIS — E785 Hyperlipidemia, unspecified: Secondary | ICD-10-CM | POA: Diagnosis not present

## 2017-03-09 DIAGNOSIS — M549 Dorsalgia, unspecified: Secondary | ICD-10-CM | POA: Diagnosis not present

## 2017-03-09 DIAGNOSIS — I110 Hypertensive heart disease with heart failure: Secondary | ICD-10-CM | POA: Diagnosis not present

## 2017-03-09 DIAGNOSIS — I890 Lymphedema, not elsewhere classified: Secondary | ICD-10-CM | POA: Diagnosis not present

## 2017-03-09 DIAGNOSIS — I509 Heart failure, unspecified: Secondary | ICD-10-CM | POA: Diagnosis not present

## 2017-03-09 DIAGNOSIS — I428 Other cardiomyopathies: Secondary | ICD-10-CM | POA: Diagnosis not present

## 2017-03-09 DIAGNOSIS — G8929 Other chronic pain: Secondary | ICD-10-CM | POA: Diagnosis not present

## 2017-03-09 DIAGNOSIS — I272 Pulmonary hypertension, unspecified: Secondary | ICD-10-CM | POA: Diagnosis not present

## 2017-03-09 DIAGNOSIS — E11622 Type 2 diabetes mellitus with other skin ulcer: Secondary | ICD-10-CM | POA: Diagnosis not present

## 2017-03-09 DIAGNOSIS — L97212 Non-pressure chronic ulcer of right calf with fat layer exposed: Secondary | ICD-10-CM | POA: Diagnosis not present

## 2017-03-13 ENCOUNTER — Ambulatory Visit: Payer: Medicare Other | Admitting: Internal Medicine

## 2017-03-13 DIAGNOSIS — L97222 Non-pressure chronic ulcer of left calf with fat layer exposed: Secondary | ICD-10-CM | POA: Diagnosis not present

## 2017-03-13 DIAGNOSIS — E11622 Type 2 diabetes mellitus with other skin ulcer: Secondary | ICD-10-CM | POA: Diagnosis not present

## 2017-03-13 DIAGNOSIS — M1990 Unspecified osteoarthritis, unspecified site: Secondary | ICD-10-CM | POA: Diagnosis not present

## 2017-03-13 DIAGNOSIS — I272 Pulmonary hypertension, unspecified: Secondary | ICD-10-CM | POA: Diagnosis not present

## 2017-03-13 DIAGNOSIS — Z4800 Encounter for change or removal of nonsurgical wound dressing: Secondary | ICD-10-CM | POA: Diagnosis not present

## 2017-03-13 DIAGNOSIS — Z79891 Long term (current) use of opiate analgesic: Secondary | ICD-10-CM | POA: Diagnosis not present

## 2017-03-13 DIAGNOSIS — Z7951 Long term (current) use of inhaled steroids: Secondary | ICD-10-CM | POA: Diagnosis not present

## 2017-03-13 DIAGNOSIS — J45909 Unspecified asthma, uncomplicated: Secondary | ICD-10-CM | POA: Diagnosis not present

## 2017-03-13 DIAGNOSIS — I429 Cardiomyopathy, unspecified: Secondary | ICD-10-CM | POA: Diagnosis not present

## 2017-03-13 DIAGNOSIS — Z7984 Long term (current) use of oral hypoglycemic drugs: Secondary | ICD-10-CM | POA: Diagnosis not present

## 2017-03-13 DIAGNOSIS — I110 Hypertensive heart disease with heart failure: Secondary | ICD-10-CM | POA: Diagnosis not present

## 2017-03-13 DIAGNOSIS — I2510 Atherosclerotic heart disease of native coronary artery without angina pectoris: Secondary | ICD-10-CM | POA: Diagnosis not present

## 2017-03-13 DIAGNOSIS — L97821 Non-pressure chronic ulcer of other part of left lower leg limited to breakdown of skin: Secondary | ICD-10-CM | POA: Diagnosis not present

## 2017-03-13 DIAGNOSIS — I509 Heart failure, unspecified: Secondary | ICD-10-CM | POA: Diagnosis not present

## 2017-03-13 NOTE — Progress Notes (Signed)
[name], [name] ([id number]) Visit Report for 03/09/2017 Arrival Information Details Patient Name: [name], [name]. Date of Service: 03/09/2017 10:00 AM Medical Record Number: [id number] Patient Account Number: [id number] Date of Birth/Sex: Apr 04, 1945 (72 y.o. Female) Treating RN: Cornell Barman Primary Care Felicity Penix: Lelon Huh Other Clinician: Referring Swara Donze: Lelon Huh Treating Nyzier Boivin/Extender: Melburn Hake, HOYT Weeks in Treatment: 22 Visit Information History Since Last Visit All ordered tests and consults were completed: No Patient Arrived: [name] Added or deleted any medications: No Arrival Time: 10:07 Any new allergies or adverse reactions: No Accompanied By: husband Had a fall or experienced change in No Transfer Assistance: None activities of daily living that may affect Patient Identification Verified: Yes risk of falls: Secondary Verification Process Completed: Yes Signs or symptoms of abuse/neglect since last visito No Patient Requires Transmission-Based No Hospitalized since last visit: No Precautions: Pain Present Now: No Patient Has Alerts: Yes Electronic Signature(s) Signed: 03/09/2017 5:16:29 PM By: Gretta Cool, BSN, RN, CWS, Kim RN, BSN Entered By: Gretta Cool, BSN, RN, CWS, Kim on 03/09/2017 10:09:37 [name] ([id number]) -------------------------------------------------------------------------------- Clinic Level of Care Assessment Details Patient Name: [name], [name] B. Date of Service: 03/09/2017 10:00 AM Medical Record Number: [id number] Patient Account Number: [id number] Date of Birth/Sex: 03-14-1945 (72 y.o. Female) Treating RN: Montey Hora Primary Care Norman Bier: Lelon Huh Other Clinician: Referring Graysen Depaula: Lelon Huh Treating Leanord Thibeau/Extender: Melburn Hake, HOYT Weeks in Treatment: 22 Clinic Level of Care Assessment Items TOOL 4 Quantity Score []  - Use when only an EandM is performed on FOLLOW-UP visit 0 ASSESSMENTS - Nursing Assessment /  Reassessment X - Reassessment of Co-morbidities (includes updates in patient status) 1 10 X- 1 5 Reassessment of Adherence to Treatment Plan ASSESSMENTS - Wound and Skin Assessment / Reassessment X - Simple Wound Assessment / Reassessment - one wound 1 5 []  - 0 Complex Wound Assessment / Reassessment - multiple wounds []  - 0 Dermatologic / Skin Assessment (not related to wound area) ASSESSMENTS - Focused Assessment X - Circumferential Edema Measurements - multi extremities 1 5 []  - 0 Nutritional Assessment / Counseling / Intervention X- 1 5 Lower Extremity Assessment (monofilament, tuning fork, pulses) []  - 0 Peripheral Arterial Disease Assessment (using hand held doppler) ASSESSMENTS - Ostomy and/or Continence Assessment and Care []  - Incontinence Assessment and Management 0 []  - 0 Ostomy Care Assessment and Management (repouching, etc.) PROCESS - Coordination of Care X - Simple Patient / Family Education for ongoing care 1 15 []  - 0 Complex (extensive) Patient / Family Education for ongoing care []  - 0 Staff obtains Programmer, systems, Records, Test Results / Process Orders []  - 0 Staff telephones HHA, Nursing Homes / Clarify orders / etc []  - 0 Routine Transfer to another Facility (non-emergent condition) []  - 0 Routine Hospital Admission (non-emergent condition) []  - 0 New Admissions / Biomedical engineer / Ordering NPWT, Apligraf, etc. []  - 0 Emergency Hospital Admission (emergent condition) X- 1 10 Simple Discharge Coordination MASHELLE, BUSICK ([id number]) []  - 0 Complex (extensive) Discharge Coordination PROCESS - Special Needs []  - Pediatric / Minor Patient Management 0 []  - 0 Isolation Patient Management []  - 0 Hearing / Language / Visual special needs []  - 0 Assessment of Community assistance (transportation, D/C planning, etc.) []  - 0 Additional assistance / Altered mentation []  - 0 Support Surface(s) Assessment (bed, cushion, seat, etc.) INTERVENTIONS -  Wound Cleansing / Measurement X - Simple Wound Cleansing - one wound 1 5 []  - 0 Complex Wound Cleansing - multiple wounds X- 1 5 Wound Imaging (  photographs - any number of wounds) []  - 0 Wound Tracing (instead of photographs) X- 1 5 Simple Wound Measurement - one wound []  - 0 Complex Wound Measurement - multiple wounds INTERVENTIONS - Wound Dressings []  - Small Wound Dressing one or multiple wounds 0 []  - 0 Medium Wound Dressing one or multiple wounds []  - 0 Large Wound Dressing one or multiple wounds []  - 0 Application of Medications - topical []  - 0 Application of Medications - injection INTERVENTIONS - Miscellaneous []  - External ear exam 0 []  - 0 Specimen Collection (cultures, biopsies, blood, body fluids, etc.) []  - 0 Specimen(s) / Culture(s) sent or taken to Lab for analysis []  - 0 Patient Transfer (multiple staff / Civil Service fast streamer / Similar devices) []  - 0 Simple Staple / Suture removal (25 or less) []  - 0 Complex Staple / Suture removal (26 or more) []  - 0 Hypo / Hyperglycemic Management (close monitor of Blood Glucose) []  - 0 Ankle / Brachial Index (ABI) - do not check if billed separately X- 1 5 Vital Signs [name], Jillian B. ([id number]) Has the patient been seen at the hospital within the last three years: Yes Total Score: 75 Level Of Care: New/Established - Level 2 Electronic Signature(s) Signed: 03/09/2017 4:45:57 PM By: Montey Hora Entered By: Montey Hora on 03/09/2017 11:16:33 [name] ([id number]) -------------------------------------------------------------------------------- Encounter Discharge Information Details Patient Name: Jillian Albe B. Date of Service: 03/09/2017 10:00 AM Medical Record Number: [id number] Patient Account Number: [id number] Date of Birth/Sex: [birth year] (72 y.o. Female) Treating RN: Ahmed Prima Primary Care Camyla Camposano: Lelon Huh Other Clinician: Referring Advaith Lamarque: Lelon Huh Treating Yeraldine Forney/Extender: Melburn Hake, HOYT Weeks in Treatment: 22 Encounter Discharge Information Items Discharge Pain Level: 0 Discharge Condition: Stable Ambulatory Status: Walker Discharge Destination: Home Transportation: Private Auto Accompanied By: husband Schedule Follow-up Appointment: Yes Medication Reconciliation completed and No provided to Patient/Care Trevione Wert: Provided on Clinical Summary of Care: 03/09/2017 Form Type Recipient Paper Patient CS Electronic Signature(s) Signed: 03/12/2017 8:29:01 AM By: Ruthine Dose Entered By: Ruthine Dose on 03/09/2017 10:44:08 [name] ([id number]) -------------------------------------------------------------------------------- Lower Extremity Assessment Details Patient Name: Jillian Albe B. Date of Service: 03/09/2017 10:00 AM Medical Record Number: [id number] Patient Account Number: [id number] Date of Birth/Sex: 04/07/1945 (72 y.o. Female) Treating RN: Cornell Barman Primary Care Larkin Alfred: Lelon Huh Other Clinician: Referring Simi Briel: Lelon Huh Treating Hopie Pellegrin/Extender: Melburn Hake, HOYT Weeks in Treatment: 22 Edema Assessment Assessed: [Left: No] [Right: No] Edema: [Left: N] [Right: o] Calf Left: Right: Point of Measurement: 30 cm From Medial Instep 39.2 cm cm Ankle Left: Right: Point of Measurement: 10 cm From Medial Instep 22 cm cm Vascular Assessment Claudication: Claudication Assessment [Left:None] Pulses: Dorsalis Pedis Palpable: [Left:Yes] Posterior Tibial Extremity colors, hair growth, and conditions: Extremity Color: [Left:Normal] Hair Growth on Extremity: [Left:Yes] Temperature of Extremity: [Left:Warm] Capillary Refill: [Left:< 3 seconds] Toe Nail Assessment Left: Right: Thick: Yes Discolored: Yes Deformed: No Improper Length and Hygiene: No Electronic Signature(s) Signed: 03/09/2017 5:16:29 PM By: Gretta Cool, BSN, RN, CWS, Kim RN, BSN Entered By: Gretta Cool, BSN, RN, CWS, Kim on 03/09/2017 10:18:38 [name]  ([id number]) -------------------------------------------------------------------------------- Multi-Disciplinary Care Plan Details Patient Name: [name], Jillian B. Date of Service: 03/09/2017 10:00 AM Medical Record Number: [id number] Patient Account Number: [id number] Date of Birth/Sex: [birth year] (72 y.o. Female) Treating RN: Montey Hora Primary Care Tienna Bienkowski: Lelon Huh Other Clinician: Referring Armentha Branagan: Lelon Huh Treating Velton Roselle/Extender: Melburn Hake, HOYT Weeks in Treatment: 22 Active Inactive Electronic Signature(s) Signed: 03/09/2017 11:15:56 AM By: Montey Hora Entered By: Marjory Lies,  Joanna on 03/09/2017 11:15:55 [name], [name] ([id number]) -------------------------------------------------------------------------------- Pain Assessment Details Patient Name: [name], EBEY. Date of Service: 03/09/2017 10:00 AM Medical Record Number: [id number] Patient Account Number: [id number] Date of Birth/Sex: 1945-02-26 (72 y.o. Female) Treating RN: Cornell Barman Primary Care Saquan Furtick: Lelon Huh Other Clinician: Referring George Haggart: Lelon Huh Treating Munir Victorian/Extender: Melburn Hake, HOYT Weeks in Treatment: 22 Active Problems Location of Pain Severity and Description of Pain Patient Has Paino No Site Locations Pain Management and Medication Current Pain Management: Electronic Signature(s) Signed: 03/09/2017 5:16:29 PM By: Gretta Cool, BSN, RN, CWS, Kim RN, BSN Entered By: Gretta Cool, BSN, RN, CWS, Kim on 03/09/2017 10:09:46 [name] ([id number]) -------------------------------------------------------------------------------- Patient/Caregiver Education Details Patient Name: [name] Date of Service: 03/09/2017 10:00 AM Medical Record Number: [id number] Patient Account Number: [id number] Date of Birth/Gender: [birth year] (72 y.o. Female) Treating RN: Ahmed Prima Primary Care Physician: Lelon Huh Other Clinician: Referring Physician: Lelon Huh Treating  Physician/Extender: Sharalyn Ink in Treatment: 22 Education Assessment Education Provided To: Patient and Caregiver Education Topics Provided Wound/Skin Impairment: Handouts: Other: Please call our office if you have any questions or concerns. Methods: Explain/Verbal Responses: State content correctly Electronic Signature(s) Signed: 03/09/2017 4:41:33 PM By: Alric Quan Entered By: Alric Quan on 03/09/2017 10:44:19 [name] ([id number]) -------------------------------------------------------------------------------- Wound Assessment Details Patient Name: [name], Jillian B. Date of Service: 03/09/2017 10:00 AM Medical Record Number: [id number] Patient Account Number: [id number] Date of Birth/Sex: April 06, 1945 (72 y.o. Female) Treating RN: Montey Hora Primary Care Analissa Bayless: Lelon Huh Other Clinician: Referring Tanairy Payeur: Lelon Huh Treating Dominique Ressel/Extender: Melburn Hake, HOYT Weeks in Treatment: 22 Wound Status Wound Number: 1 Primary Diabetic Wound/Ulcer of the Lower Extremity Etiology: Wound Location: Left, Lateral Lower Leg Wound Healed - Epithelialized Wounding Event: Gradually Appeared Status: Date Acquired: 08/02/2016 Comorbid Asthma, Congestive Heart Failure, Coronary Weeks Of Treatment: 22 History: Artery Disease, Hypertension, Type II Clustered Wound: No Diabetes, Osteoarthritis Wound Measurements Length: (cm) 0 % Reduc Width: (cm) 0 % Reduc Depth: (cm) 0 Epithel Area: (cm) 0 Tunnel Volume: (cm) 0 Underm tion in Area: 100% tion in Volume: 100% ialization: Large (67-100%) ing: No ining: No Wound Description Classification: Grade 2 Foul Od Wound Margin: Flat and Intact Slough/ Exudate Amount: None Present or After Cleansing: No Fibrino No Wound Bed Granulation Amount: Large (67-100%) Exposed Structure Granulation Quality: Red Fascia Exposed: No Necrotic Amount: None Present (0%) Fat Layer (Subcutaneous Tissue) Exposed:  No Tendon Exposed: No Muscle Exposed: No Joint Exposed: No Bone Exposed: No Periwound Skin Texture Texture Color No Abnormalities Noted: No No Abnormalities Noted: No Callus: No Atrophie Blanche: No Crepitus: No Cyanosis: No Excoriation: No Ecchymosis: No Induration: No Erythema: No Rash: No Hemosiderin Staining: No Scarring: No Mottled: No Pallor: No Moisture Rubor: No No Abnormalities Noted: No Dry / Scaly: No Temperature / Pain Maceration: No Temperature: No Abnormality Wound Preparation [name], [name] ([id number]) Ulcer Cleansing: Rinsed/Irrigated with Saline Topical Anesthetic Applied: None Electronic Signature(s) Signed: 03/09/2017 4:45:57 PM By: Montey Hora Entered By: Montey Hora on 03/09/2017 10:36:23 [name] ([id number]) -------------------------------------------------------------------------------- Vitals Details Patient Name: Jillian Albe B. Date of Service: 03/09/2017 10:00 AM Medical Record Number: [id number] Patient Account Number: [id number] Date of Birth/Sex: [birth year] (72 y.o. Female) Treating RN: Cornell Barman Primary Care Kalley Nicholl: Lelon Huh Other Clinician: Referring Carisma Troupe: Lelon Huh Treating Orvill Coulthard/Extender: Melburn Hake, HOYT Weeks in Treatment: 22 Vital Signs Time Taken: 10:09 Temperature (F): 97.6 Height (in): 64 Pulse (bpm): 62 Weight (lbs): 209 Respiratory Rate (breaths/min): 18 Body Mass Index (BMI): 35.9  Blood Pressure (mmHg): 141/59 Reference Range: 80 - 120 mg / dl Electronic Signature(s) Signed: 03/09/2017 5:16:29 PM By: Gretta Cool, BSN, RN, CWS, Kim RN, BSN Entered By: Gretta Cool, BSN, RN, CWS, Kim on 03/09/2017 10:10:05

## 2017-03-13 NOTE — Progress Notes (Signed)
EMMERSON, SHUFFIELD ([id number]) Visit Report for 03/09/2017 Chief Complaint Document Details Patient Name: [name], [name]. Date of Service: 03/09/2017 10:00 AM Medical Record Number: [id number] Patient Account Number: [id number] Date of Birth/Sex: Oct 04, 1945 (72 y.o. Female) Treating RN: Montey Hora Primary Care Provider: Lelon Huh Other Clinician: Referring Provider: Lelon Huh Treating Provider/Extender: Melburn Hake, Torrey Horseman Weeks in Treatment: 22 Information Obtained from: Patient Chief Complaint Patient seen for complaints of Non-Healing Wounds to both lower extremities Electronic Signature(s) Signed: 03/09/2017 6:07:32 PM By: Worthy Keeler PA-C Entered By: Worthy Keeler on 03/09/2017 09:46:55 Brogden, Jeanice Lim ([id number]) -------------------------------------------------------------------------------- HPI Details Patient Name: [name]. Date of Service: 03/09/2017 10:00 AM Medical Record Number: [id number] Patient Account Number: [id number] Date of Birth/Sex: 28-Apr-1945 (72 y.o. Female) Treating RN: Montey Hora Primary Care Provider: Lelon Huh Other Clinician: Referring Provider: Lelon Huh Treating Provider/Extender: Melburn Hake, Gerardo Caiazzo Weeks in Treatment: 22 History of Present Illness Location: both lower extremity swelling with ulceration HPI Description: 72 year old patient who sees her PCP Dr. Lelon Huh was recently evaluated 10 days ago for diabetes mellitus, hypertension, CHF and hyperlipidemia. she also was noted to have ulcerations develop in her legs and she has been applying Silvadene dressings locally. In the past she has refused wound care referrals.her cardiologist Dr. Saunders Revel saw her and put her on 40 mg of furosemide daily. last hemoglobin A1c was 7.7%. she was also placed on ciprofloxacin twice daily for 7 days and a urine culture was recommended. past medical history significant for coronary artery disease, diabetes mellitus, nonischemic  cardiomyopathy and pulmonary hypertension. She is also status post heart catheterization and coronary angiography, tubal ligation and breast cyst removal in the past. She has never been a smoker. Patient had arterial studies done which showed bilateral ABIs are artificially elevated due to noncompressible and calcified vessels. Triphasic waveform throughout. Right great toe TBI is elevated while the left is normal. 10/23/2016 -- the patient is rather moribund from several issues including chronic back pain and knee pain and swelling of her legs. The large necrotic area on her left lateral anterior calf was bleeding on touch after washing her leg. There was a spot which needed silver nitrate cauterization and this was done appropriately. 11/06/2016 -- she is again noted to have friable bleeding from the left lower extremity wounds and this again had to be cauterized with silver nitrate to control the bleeding as pressure itself would not do it. 11/20/2016 -- the right lower extremity is completely healed and we have ordered 30-40 mm compression stocking's in both the dural layer and also a pair of juxta lites. 12/18/2016 -- she has not been wearing her compression stockings on her right leg and these have opened out into ulcerations again. Her left lower extremity has circumferential ulcerations now. I believe at this stage I would like to get her venous reflux studies done to make certain that there is no fixable superficial venous reflux. 12/29/2016 -- she has made an excellent recovery having continued with her appropriate doses of diuretics and elevation and exercise. She has not yet received her right lower extremity juxta lites. Her venous duplex study is at the end of January 01/13/16 on evaluation today patient's wounds appeared to be overall doing much better much less hyper granular than previous week's evaluation. The Hydrofera Blue Dressing's to be doing very well. She does tell me that  she is having a little bit more discomfort in the posterior aspect of her leg where she has a wound  at this point. Fortunately there appears to be no infection. 01/19/17 on evaluation today patient appears to be doing very well in regard to her bilateral lower extremity swelling and at this point in time her left lower extremity ulcers. Her wounds appear to be doing much better. She has been tolerating the dressing's at this point fortunately she did get the Juxta-Lite compression as of today as well. Nonetheless I am pleased that she has been tolerating everything so well and that her wounds looks so good. In fact she is an excellent granular surface no evidence of slough covering and I do not see any reason for likely debridement today. Especially in regard to the posterior leg. 01/26/17 on evaluation today patient appears to be doing decently well in regard to her wounds. The surface of the majority of her wounds is greatly improved. She has been using the Juxta-Lite compression which is excellent. Nonetheless the wound of the left posterior calf did require some debridement today otherwise the majority of the wounds did not require any debridement. The this was due to slough buildup on the surface. She also has a small skin flap where two of the ulcers actually connected and this has loosened up I'm concerned that things may not heal well with that flap but for the time being I'm not gonna do anything different in that regard. [name], [name] ([id number]) 02/23/17 on evaluation today patient's wound actually appears to be doing fairly well in regard to the remaining left lateral ulcer which we have been treated. With that being said it does appear that her wrap actually slid down the wood bit over the past few days at least that there's actually some friction injury and blistering noted on the medial aspect of her malleolus of the left lower extremity. She continues to use the Juxta-Lite for the  right lower extremity. With that being said no debridement is necessary today and these are very superficial injuries as far as the new injuries are concerned. 03/02/17 on evaluation today patient appears to be doing excellent in regard to her left lateral lower from the ulcers. In fact it appears that she is almost completely healed in regard to the ulcer we have been following and the new injuries which were caused by the wraps slipping down last week have been completely resolved which is excellent news. Overall I'm definitely pleased with the progress that she has made. Fortunately the rat did not cause any irritation as it did previously with the Unna wrap. 03/09/17 on evaluation today patient's wound appears to be completely healed. Obviously this is great news. She and her husband both are extremely pleased and excited to finally have this gone. Obviously she has been dealing with this wound for quite some time. Electronic Signature(s) Signed: 03/09/2017 6:07:32 PM By: Worthy Keeler PA-C Entered By: Worthy Keeler on 03/09/2017 10:40:15 [name] ([id number]) -------------------------------------------------------------------------------- Physical Exam Details Patient Name: PALMYRA, ROGACKI B. Date of Service: 03/09/2017 10:00 AM Medical Record Number: [id number] Patient Account Number: [id number] Date of Birth/Sex: 02-20-1945 (72 y.o. Female) Treating RN: Montey Hora Primary Care Provider: Lelon Huh Other Clinician: Referring Provider: Lelon Huh Treating Provider/Extender: Melburn Hake, Amarys Sliwinski Weeks in Treatment: 28 Constitutional Well-nourished and well-hydrated in no acute distress. Cardiovascular 1+ pitting edema of the bilateral lower extremities. Psychiatric this patient is able to make decisions and demonstrates good insight into disease process. Alert and Oriented x 3. pleasant and cooperative. Notes There is no opening at all at this  time everything appears to be  completely healed and sealed. She does continue to have swelling control with the wrap she does have her Juxta-Lite compression at this point in time as well that we are going to initiate. Electronic Signature(s) Signed: 03/09/2017 6:07:32 PM By: Worthy Keeler PA-C Entered By: Worthy Keeler on 03/09/2017 10:40:53 [name] ([id number]) -------------------------------------------------------------------------------- Physician Orders Details Patient Name: MCKINZIE, SAKSA B. Date of Service: 03/09/2017 10:00 AM Medical Record Number: [id number] Patient Account Number: [id number] Date of Birth/Sex: [birth year] (72 y.o. Female) Treating RN: Montey Hora Primary Care Provider: Lelon Huh Other Clinician: Referring Provider: Lelon Huh Treating Provider/Extender: Melburn Hake, Sonia Stickels Weeks in Treatment: 69 Verbal / Phone Orders: No Diagnosis Coding ICD-10 Coding Code Description E11.622 Type 2 diabetes mellitus with other skin ulcer I89.0 Lymphedema, not elsewhere classified L97.212 Non-pressure chronic ulcer of right calf with fat layer exposed L97.222 Non-pressure chronic ulcer of left calf with fat layer exposed Discharge From [facility] o Discharge from [place] Signature(s) Signed: 03/09/2017 4:45:57 PM By: Montey Hora Signed: 03/09/2017 6:07:32 PM By: Worthy Keeler PA-C Entered By: Montey Hora on 03/09/2017 10:36:42 [name] ([id number]) -------------------------------------------------------------------------------- Problem List Details Patient Name: JIAYI, LENGACHER B. Date of Service: 03/09/2017 10:00 AM Medical Record Number: [id number] Patient Account Number: [id number] Date of Birth/Sex: April 30, 1945 (72 y.o. Female) Treating RN: Montey Hora Primary Care Provider: Lelon Huh Other Clinician: Referring Provider: Lelon Huh Treating Provider/Extender: Melburn Hake, Raunel Dimartino Weeks in Treatment: 22 Active Problems ICD-10 Encounter Code  Description Active Date Diagnosis E11.622 Type 2 diabetes mellitus with other skin ulcer 10/02/2016 Yes I89.0 Lymphedema, not elsewhere classified 10/02/2016 Yes L97.212 Non-pressure chronic ulcer of right calf with fat layer exposed 10/02/2016 Yes L97.222 Non-pressure chronic ulcer of left calf with fat layer exposed 10/02/2016 Yes Inactive Problems Resolved Problems Electronic Signature(s) Signed: 03/09/2017 6:07:32 PM By: Worthy Keeler PA-C Entered By: Worthy Keeler on 03/09/2017 09:46:44 Baudoin, Jeanice Lim ([id number]) -------------------------------------------------------------------------------- Progress Note Details Patient Name: [name] Date of Service: 03/09/2017 10:00 AM Medical Record Number: [id number] Patient Account Number: [id number] Date of Birth/Sex: 04-Jan-1945 (72 y.o. Female) Treating RN: Montey Hora Primary Care Provider: Lelon Huh Other Clinician: Referring Provider: Lelon Huh Treating Provider/Extender: Melburn Hake, Tryniti Laatsch Weeks in Treatment: 22 Subjective Chief Complaint Information obtained from Patient Patient seen for complaints of Non-Healing Wounds to both lower extremities History of Present Illness (HPI) The following HPI elements were documented for the patient's wound: Location: both lower extremity swelling with ulceration 72 year old patient who sees her PCP Dr. Lelon Huh was recently evaluated 10 days ago for diabetes mellitus, hypertension, CHF and hyperlipidemia. she also was noted to have ulcerations develop in her legs and she has been applying Silvadene dressings locally. In the past she has refused wound care referrals.her cardiologist Dr. Saunders Revel saw her and put her on 40 mg of furosemide daily. last hemoglobin A1c was 7.7%. she was also placed on ciprofloxacin twice daily for 7 days and a urine culture was recommended. past medical history significant for coronary artery disease, diabetes mellitus, nonischemic cardiomyopathy and  pulmonary hypertension. She is also status post heart catheterization and coronary angiography, tubal ligation and breast cyst removal in the past. She has never been a smoker. Patient had arterial studies done which showed bilateral ABIs are artificially elevated due to noncompressible and calcified vessels. Triphasic waveform throughout. Right great toe TBI is elevated while the left is normal. 10/23/2016 -- the patient is rather moribund from several issues  including chronic back pain and knee pain and swelling of her legs. The large necrotic area on her left lateral anterior calf was bleeding on touch after washing her leg. There was a spot which needed silver nitrate cauterization and this was done appropriately. 11/06/2016 -- she is again noted to have friable bleeding from the left lower extremity wounds and this again had to be cauterized with silver nitrate to control the bleeding as pressure itself would not do it. 11/20/2016 -- the right lower extremity is completely healed and we have ordered 30-40 mm compression stocking's in both the dural layer and also a pair of juxta lites. 12/18/2016 -- she has not been wearing her compression stockings on her right leg and these have opened out into ulcerations again. Her left lower extremity has circumferential ulcerations now. I believe at this stage I would like to get her venous reflux studies done to make certain that there is no fixable superficial venous reflux. 12/29/2016 -- she has made an excellent recovery having continued with her appropriate doses of diuretics and elevation and exercise. She has not yet received her right lower extremity juxta lites. Her venous duplex study is at the end of January 01/13/16 on evaluation today patient's wounds appeared to be overall doing much better much less hyper granular than previous week's evaluation. The Hydrofera Blue Dressing's to be doing very well. She does tell me that she is having a  little bit more discomfort in the posterior aspect of her leg where she has a wound at this point. Fortunately there appears to be no infection. 01/19/17 on evaluation today patient appears to be doing very well in regard to her bilateral lower extremity swelling and at this point in time her left lower extremity ulcers. Her wounds appear to be doing much better. She has been tolerating the dressing's at this point fortunately she did get the Juxta-Lite compression as of today as well. Nonetheless I am pleased that she has been tolerating everything so well and that her wounds looks so good. In fact she is an excellent granular surface no evidence of slough covering and I do not see any reason for likely debridement today. Especially in regard to the posterior Magnolia, Francesca B. ([id number]) leg. 01/26/17 on evaluation today patient appears to be doing decently well in regard to her wounds. The surface of the majority of her wounds is greatly improved. She has been using the Juxta-Lite compression which is excellent. Nonetheless the wound of the left posterior calf did require some debridement today otherwise the majority of the wounds did not require any debridement. The this was due to slough buildup on the surface. She also has a small skin flap where two of the ulcers actually connected and this has loosened up I'm concerned that things may not heal well with that flap but for the time being I'm not gonna do anything different in that regard. 02/23/17 on evaluation today patient's wound actually appears to be doing fairly well in regard to the remaining left lateral ulcer which we have been treated. With that being said it does appear that her wrap actually slid down the wood bit over the past few days at least that there's actually some friction injury and blistering noted on the medial aspect of her malleolus of the left lower extremity. She continues to use the Juxta-Lite for the right lower  extremity. With that being said no debridement is necessary today and these are very superficial injuries as far  as the new injuries are concerned. 03/02/17 on evaluation today patient appears to be doing excellent in regard to her left lateral lower from the ulcers. In fact it appears that she is almost completely healed in regard to the ulcer we have been following and the new injuries which were caused by the wraps slipping down last week have been completely resolved which is excellent news. Overall I'm definitely pleased with the progress that she has made. Fortunately the rat did not cause any irritation as it did previously with the Unna wrap. 03/09/17 on evaluation today patient's wound appears to be completely healed. Obviously this is great news. She and her husband both are extremely pleased and excited to finally have this gone. Obviously she has been dealing with this wound for quite some time. Patient History Information obtained from Patient. Family History Cancer - Mother,Siblings, Diabetes - Mother,Siblings, Heart Disease - Mother, Hypertension - Siblings, No family history of Hereditary Spherocytosis, Kidney Disease, Lung Disease, Seizures, Stroke, Thyroid Problems, Tuberculosis. Social History Never smoker, Marital Status - Married, Alcohol Use - Never, Drug Use - No History, Caffeine Use - Daily. Review of Systems (ROS) Constitutional Symptoms (General Health) Denies complaints or symptoms of Fever, Chills. Respiratory The patient has no complaints or symptoms. Cardiovascular Complains or has symptoms of LE edema. Psychiatric The patient has no complaints or symptoms. Objective Constitutional Well-nourished and well-hydrated in no acute distress. IVEY, NEMBHARD ([id number]) Vitals Time Taken: 10:09 AM, Height: 64 in, Weight: 209 lbs, BMI: 35.9, Temperature: 97.6 F, Pulse: 62 bpm, Respiratory Rate: 18 breaths/min, Blood Pressure: 141/59 mmHg. Cardiovascular 1+  pitting edema of the bilateral lower extremities. Psychiatric this patient is able to make decisions and demonstrates good insight into disease process. Alert and Oriented x 3. pleasant and cooperative. General Notes: There is no opening at all at this time everything appears to be completely healed and sealed. She does continue to have swelling control with the wrap she does have her Juxta-Lite compression at this point in time as well that we are going to initiate. Integumentary (Hair, Skin) Wound #1 status is Healed - Epithelialized. Original cause of wound was Gradually Appeared. The wound is located on the Left,Lateral Lower Leg. The wound measures 0cm length x 0cm width x 0cm depth; 0cm^2 area and 0cm^3 volume. There is no tunneling or undermining noted. There is a none present amount of drainage noted. The wound margin is flat and intact. There is large (67-100%) red granulation within the wound bed. There is no necrotic tissue within the wound bed. The periwound skin appearance did not exhibit: Callus, Crepitus, Excoriation, Induration, Rash, Scarring, Dry/Scaly, Maceration, Atrophie Blanche, Cyanosis, Ecchymosis, Hemosiderin Staining, Mottled, Pallor, Rubor, Erythema. Periwound temperature was noted as No Abnormality. Assessment Active Problems ICD-10 E11.622 - Type 2 diabetes mellitus with other skin ulcer I89.0 - Lymphedema, not elsewhere classified L97.212 - Non-pressure chronic ulcer of right calf with fat layer exposed L97.222 - Non-pressure chronic ulcer of left calf with fat layer exposed Plan Discharge From [facility] Services: Discharge from [place] We want this point in time discontinue wound care services since patient is healed. Obviously they have any problems going for they know they can get in touch with me for additional recommendations we will do what we can to help in any regard. Otherwise it was really a pleasure caring for her and I hope things go well for  her going forward. LENNYN, GANGE ([id number]) Electronic Signature(s) Signed: 03/09/2017 6:07:32 PM By: Joaquim Lai  III, Domenick Quebedeaux PA-C Entered By: Worthy Keeler on 03/09/2017 10:41:19 [name] ([id number]) -------------------------------------------------------------------------------- ROS/PFSH Details Patient Name: DUHA, ABAIR B. Date of Service: 03/09/2017 10:00 AM Medical Record Number: [id number] Patient Account Number: [id number] Date of Birth/Sex: 09-13-1945 (72 y.o. Female) Treating RN: Montey Hora Primary Care Provider: Lelon Huh Other Clinician: Referring Provider: Lelon Huh Treating Provider/Extender: Melburn Hake, Jonda Alanis Weeks in Treatment: 22 Information Obtained From Patient Wound History Do you currently have one or more open woundso Yes How many open wounds do you currently haveo 6 Approximately how long have you had your woundso 2 months How have you been treating your wound(s) until nowo silvadene Has your wound(s) ever healed and then re-openedo No Have you had any lab work done in the past montho No Have you tested positive for an antibiotic resistant organism (MRSA, VRE)o No Have you tested positive for osteomyelitis (bone infection)o No Have you had any tests for circulation on your legso Yes Where was the test doneo [facility] Have you had other problems associated with your woundso Infection, Swelling Constitutional Symptoms (General Health) Complaints and Symptoms: Negative for: Fever; Chills Cardiovascular Complaints and Symptoms: Positive for: LE edema Medical History: Positive for: Congestive Heart Failure; Coronary Artery Disease; Hypertension Respiratory Complaints and Symptoms: No Complaints or Symptoms Medical History: Positive for: Asthma Endocrine Medical History: Positive for: Type II Diabetes Time with diabetes: a long time Treated with: Oral agents Blood sugar tested every day: No Musculoskeletal BAYLIN, CABAL ([id number]) Medical  History: Positive for: Osteoarthritis Psychiatric Complaints and Symptoms: No Complaints or Symptoms Immunizations Pneumococcal Vaccine: Received Pneumococcal Vaccination: Yes Implantable Devices Family and Social History Cancer: Yes - Mother,Siblings; Diabetes: Yes - Mother,Siblings; Heart Disease: Yes - Mother; Hereditary Spherocytosis: No; Hypertension: Yes - Siblings; Kidney Disease: No; Lung Disease: No; Seizures: No; Stroke: No; Thyroid Problems: No; Tuberculosis: No; Never smoker; Marital Status - Married; Alcohol Use: Never; Drug Use: No History; Caffeine Use: Daily; Financial Concerns: No; Food, Clothing or Shelter Needs: No; Support System Lacking: No; Transportation Concerns: No; Advanced Directives: No; Patient does not want information on Advanced Directives; Do not resuscitate: No; Living Will: No; Medical Power of Attorney: No Physician Affirmation I have reviewed and agree with the above information. Electronic Signature(s) Signed: 03/09/2017 4:45:57 PM By: Montey Hora Signed: 03/09/2017 6:07:32 PM By: Worthy Keeler PA-C Entered By: Worthy Keeler on 03/09/2017 10:40:36 [name] ([id number]) -------------------------------------------------------------------------------- SuperBill Details Patient Name: AERILYNN, GOIN B. Date of Service: 03/09/2017 Medical Record Number: [id number] Patient Account Number: [id number] Date of Birth/Sex: 1945/01/31 (72 y.o. Female) Treating RN: Montey Hora Primary Care Provider: Lelon Huh Other Clinician: Referring Provider: Lelon Huh Treating Provider/Extender: Melburn Hake, Timmey Lamba Weeks in Treatment: 22 Diagnosis Coding ICD-10 Codes Code Description E11.622 Type 2 diabetes mellitus with other skin ulcer I89.0 Lymphedema, not elsewhere classified L97.212 Non-pressure chronic ulcer of right calf with fat layer exposed L97.222 Non-pressure chronic ulcer of left calf with fat layer exposed Physician Procedures CPT4 Code:  [id number] Description: 59458 - WC PHYS LEVEL 2 - EST PT ICD-10 Diagnosis Description E11.622 Type 2 diabetes mellitus with other skin ulcer I89.0 Lymphedema, not elsewhere classified L97.212 Non-pressure chronic ulcer of right calf with fat layer exp L97.222  Non-pressure chronic ulcer of left calf with fat layer expo Modifier: osed sed Quantity: 1 Electronic Signature(s) Signed: 03/09/2017 6:07:32 PM By: Worthy Keeler PA-C Entered By: Worthy Keeler on 03/09/2017 10:41:50

## 2017-03-14 ENCOUNTER — Ambulatory Visit: Payer: Medicare Other | Admitting: Internal Medicine

## 2017-03-14 ENCOUNTER — Encounter: Payer: Self-pay | Admitting: Internal Medicine

## 2017-03-14 VITALS — BP 112/58 | HR 59 | Ht 64.0 in | Wt 205.0 lb

## 2017-03-14 DIAGNOSIS — I2510 Atherosclerotic heart disease of native coronary artery without angina pectoris: Secondary | ICD-10-CM | POA: Diagnosis not present

## 2017-03-14 DIAGNOSIS — I428 Other cardiomyopathies: Secondary | ICD-10-CM

## 2017-03-14 DIAGNOSIS — N183 Chronic kidney disease, stage 3 unspecified: Secondary | ICD-10-CM

## 2017-03-14 DIAGNOSIS — I429 Cardiomyopathy, unspecified: Secondary | ICD-10-CM | POA: Diagnosis not present

## 2017-03-14 DIAGNOSIS — Z79891 Long term (current) use of opiate analgesic: Secondary | ICD-10-CM | POA: Diagnosis not present

## 2017-03-14 DIAGNOSIS — I10 Essential (primary) hypertension: Secondary | ICD-10-CM | POA: Diagnosis not present

## 2017-03-14 DIAGNOSIS — Z7984 Long term (current) use of oral hypoglycemic drugs: Secondary | ICD-10-CM | POA: Diagnosis not present

## 2017-03-14 DIAGNOSIS — J45909 Unspecified asthma, uncomplicated: Secondary | ICD-10-CM | POA: Diagnosis not present

## 2017-03-14 DIAGNOSIS — I110 Hypertensive heart disease with heart failure: Secondary | ICD-10-CM | POA: Diagnosis not present

## 2017-03-14 DIAGNOSIS — Z7951 Long term (current) use of inhaled steroids: Secondary | ICD-10-CM | POA: Diagnosis not present

## 2017-03-14 DIAGNOSIS — I509 Heart failure, unspecified: Secondary | ICD-10-CM | POA: Diagnosis not present

## 2017-03-14 DIAGNOSIS — M1990 Unspecified osteoarthritis, unspecified site: Secondary | ICD-10-CM | POA: Diagnosis not present

## 2017-03-14 DIAGNOSIS — E11622 Type 2 diabetes mellitus with other skin ulcer: Secondary | ICD-10-CM | POA: Diagnosis not present

## 2017-03-14 DIAGNOSIS — I272 Pulmonary hypertension, unspecified: Secondary | ICD-10-CM | POA: Diagnosis not present

## 2017-03-14 DIAGNOSIS — Z4800 Encounter for change or removal of nonsurgical wound dressing: Secondary | ICD-10-CM | POA: Diagnosis not present

## 2017-03-14 DIAGNOSIS — L97222 Non-pressure chronic ulcer of left calf with fat layer exposed: Secondary | ICD-10-CM | POA: Diagnosis not present

## 2017-03-14 DIAGNOSIS — L97821 Non-pressure chronic ulcer of other part of left lower leg limited to breakdown of skin: Secondary | ICD-10-CM | POA: Diagnosis not present

## 2017-03-14 NOTE — Progress Notes (Signed)
Follow-up Outpatient Visit Date: 03/14/2017  Primary Care Provider: Birdie Sons, MD [address] [place] [address]  Chief Complaint: Follow-up NICM  HPI:  [name] is a 72 y.o. year-old female with history of non-obstructive CAD, NICM with chronic systolic and diastolic heart failure, HTN, DM, and multiple myeloma, who presents for follow-up of CAD and CHF. I last saw [name] in 11/2016, at which time she noted continued significant leg swelling, which had improved with escalation of furosemide. She otherwise felt well but was concerned about the cost of Entresto. We therefore opted to switch to lisinopril 20 mg daily.  Today, [name] and [name] husband are happy to report that she has been doing well. [name] leg edema is well controlled. With the help of the wound care clinic, [name] leg wounds have completely healed. She continues to use compression wraps to help minimize swelling. [name] denies chest pain, shortness of breath, palpitations, lightheadedness, and orthopnea. [name] only concern is frequent urination that she attributes to furosemide. [name] weight has been stable. She is trying to limit [name] salt intake but still eats pizza ~1x/week.  --------------------------------------------------------------------------------------------------  Cardiovascular History & Procedures: Cardiovascular Problems:  Chronic systolic and diastolic heart failure secondary to non-ischemic cardiomyopathy  Nonobstructive coronary artery disease  Moderate aortic stenosis  Risk Factors:  Known coronary artery disease, hypertension, hyperlipidemia, diabetes mellitus, sedentary lifestyle, obesity, and age > 72  Cath/PCI:  LHC/RHC (03/07/16): LMCA normal. LAD with 30% ostial stenosi, 30$ midvessel narrowing, and 50% distal disease. LCx with 40% midvessel lesion. RCA with 30% proximal narrowing. LVEDP 26 mmHg. RA 18 (prominent "M" appearance), RV 82/21, PA mean 55, PCWP 25. Ao sat  96%, PA sat 54%, Fick Co/Ci 3.2/1.7. PVR 9.4 Wood units. No significant AoV gradient.  CV Surgery:  None  EP Procedures and Devices:  None  Non-Invasive Evaluation(s):  ABI's (07/26/16):Right:ABI notobtained, TBI 1.8. Left:ABI 1.9, TBI 1.0.Triphasic waveforms noted in both lower extremities.  TTE (07/26/16): Normal LV size with moderate LVH. LVEF 55-60% with normal wall motion. Grade 1 diastolic dysfunction. Mild aortic stenosis (mean gradient 11 mmHg). Mitral annular calcification with mild to moderate MR. Mild left atrial enlargement. Normal RV size and function. Normal PA pressure.  TTE (02/25/16): Normal LV size with moderate LVH. LVEF 25-30% with grade 2 diastolic dysfunction. Thickened aortic valve with likely moderate stenosis. MAC with mild MR. Mild LA enlargement. Mildly to moderately reduced RV contraction. Mild-moderate TR. Moderately PH (RVSP 55 mmHg).  Recent CV Pertinent Labs: Lab Results  Component Value Date   CHOL 150 06/22/2016   HDL 47 06/22/2016   LDLCALC 78 06/22/2016   TRIG 125 06/22/2016   CHOLHDL 3.2 06/22/2016   INR 1.0 03/02/2016   BNP 1,592.0 (H) 02/09/2016   K 4.5 03/01/2017   BUN 28 (H) 03/01/2017   BUN 22 11/08/2016   CREATININE 1.15 (H) 03/01/2017   CREATININE 1.31 (H) 12/21/2016    Past medical and surgical history were reviewed and updated in EPIC.  Current Meds  Medication Sig  . amLODipine (NORVASC) 5 MG tablet TAKE 1 TABLET (5 MG TOTAL) BY MOUTH DAILY.  Marland Kitchen aspirin EC 81 MG tablet Take 1 tablet (81 mg total) by mouth daily.  . cyanocobalamin 500 MCG tablet Take 500 mcg by mouth daily.  . fluticasone furoate-vilanterol (BREO ELLIPTA) 200-25 MCG/INH AEPB Inhale 1 puff into the lungs daily. FOR ASTHMA J45.20  . HYDROcodone-acetaminophen (NORCO) 10-325 MG tablet Take 1 tablet by mouth every 8 (eight)  hours as needed.  . lovastatin (MEVACOR) 20 MG tablet TAKE 1 TABLET (20 MG TOTAL) BY MOUTH AT BEDTIME.  . meloxicam (MOBIC) 15 MG tablet  Take 1 tablet (15 mg total) by mouth daily as needed for pain. Take with food  . metFORMIN (GLUCOPHAGE-XR) 750 MG 24 hr tablet TAKE 2 TABLETS (1,500 MG TOTAL) BY MOUTH DAILY.  . metoprolol tartrate (LOPRESSOR) 50 MG tablet Take 1 tablet (50 mg total) by mouth 2 (two) times daily.  . silver sulfADIAZINE (SILVADENE) 1 % cream APPLY TO AFFECTED AREA DAILY  . spironolactone (ALDACTONE) 25 MG tablet TAKE 1 TABLET (25 MG TOTAL) BY MOUTH DAILY.  . traZODone (DESYREL) 100 MG tablet TAKE 1 TABLET BY MOUTH EVERY NIGHT AT BEDTIME AS NEEDED FOR INSOMNIA    Allergies: Hydrochlorothiazide and Penicillins  Social History   Socioeconomic History  . Marital status: Married    Spouse name: Not on file  . Number of children: Not on file  . Years of education: Not on file  . Highest education level: Not on file  Social Needs  . Financial resource strain: Not on file  . Food insecurity - worry: Not on file  . Food insecurity - inability: Not on file  . Transportation needs - medical: Not on file  . Transportation needs - non-medical: Not on file  Occupational History  . Not on file  Tobacco Use  . Smoking status: Never Smoker  . Smokeless tobacco: Never Used  Substance and Sexual Activity  . Alcohol use: No  . Drug use: No  . Sexual activity: Not on file  Other Topics Concern  . Not on file  Social History Narrative  . Not on file    Family History  Problem Relation Age of Onset  . Cancer Mother        Uterine cancer  . Diabetes Mother   . Heart disease Mother   . Heart failure Mother   . Parkinson's disease Father   . Cancer Sister        breast cancer  . Diabetes Brother        Non-insulin Dependent Diabetes Mellitus  . Mesothelioma Brother   . Hypertension Sister   . Diabetes Sister        Non-insulin dependent Diabetes Mellitus    Review of Systems: A 12-system review of systems was performed and was negative except as noted in the  HPI.  --------------------------------------------------------------------------------------------------  Physical Exam: BP (!) 112/58 (BP Location: Right Arm, Patient Position: Sitting, Cuff Size: Normal)   Pulse (!) 59   Ht _0  (1.626 m)   Wt 205 lb (93 kg)   BMI 35.19 kg/m   General:  Obese woman, seated comfortably in a wheelchair. She is accompanied by [name] husband HEENT: No conjunctival pallor or scleral icterus. Moist mucous membranes.  OP clear. Neck: Supple without lymphadenopathy, thyromegaly, JVD, or HJR, though evaluation is limited by body habitus. Lungs: Normal work of breathing. Mildly diminished breath sounds throughout without wheezes or crackles. Heart: Regular rate and rhythm without murmurs, rubs, or gallops. Unable to assess PMI due to body habitus. Abd: Bowel sounds present. Soft, NT/ND. Unable to assess HSM due to body habitus. Ext: Trace pretibial edema bilaterally. Skin: Warm and dry without rash.  EKG:  Sinus bradycardia (HR 59 bpm) without significant abnormalities or changes from prior tracing on 11/08/16.  Lab Results  Component Value Date   WBC 4.9 03/01/2017   HGB 9.6 (L) 03/01/2017   HCT 28.6 (L)  03/01/2017   MCV 87.4 03/01/2017   PLT 255 03/01/2017    Lab Results  Component Value Date   NA 134 (L) 03/01/2017   K 4.5 03/01/2017   CL 101 03/01/2017   CO2 26 03/01/2017   BUN 28 (H) 03/01/2017   CREATININE 1.15 (H) 03/01/2017   GLUCOSE 99 03/01/2017   ALT 10 (L) 03/01/2017    Lab Results  Component Value Date   CHOL 150 06/22/2016   HDL 47 06/22/2016   LDLCALC 78 06/22/2016   TRIG 125 06/22/2016   CHOLHDL 3.2 06/22/2016    --------------------------------------------------------------------------------------------------  ASSESSMENT AND PLAN: Chronic systolic and diastolic heart failure secondary to nonischemic cardiomyopathy Ms. Conaway appears euvolemic and has done well since our last visit in November. We will not make any  medication changes today. I reiterated the importance of minimizing [name] salt intake. BMP 2 weeks ago showed stable renal function and normal potassium. We will need to continue checking BMP's every 3 months given ongoing spironolactone and lisinopril use.  Coronary artery disease without angina No chest pain reported. Continue current medication to prevent progression of non-obstructive CAD seen on cath last year.  Chronic kidney disease stage 3 Creatinine stable on last check in oncology clinic on 03/01/17. Continue current medications, including lisinopril and furosemide. Plan for Q3 month BMPs (if not checked in the interim by oncology).  Follow-up: Return to clinic in 6 months.  Nelva Bush, MD 03/14/2017 11:07 AM

## 2017-03-14 NOTE — Patient Instructions (Signed)
Medication Instructions:  Your physician recommends that you continue on your current medications as directed. Please refer to the Current Medication list given to you today.   Labwork: Your physician recommends that you return for lab work in: about 3 months for a BMET if your other doctor does not between now and then. Call our office if this is needed.   Testing/Procedures: none  Follow-Up: Your physician wants you to follow-up in: 6 MONTHS WITH DR END. You will receive a reminder letter in the mail two months in advance. If you don't receive a letter, please call our office to schedule the follow-up appointment.   If you need a refill on your cardiac medications before your next appointment, please call your pharmacy.

## 2017-03-15 ENCOUNTER — Telehealth: Payer: Self-pay | Admitting: Family Medicine

## 2017-03-15 ENCOUNTER — Inpatient Hospital Stay: Payer: Medicare Other | Attending: Hematology and Oncology

## 2017-03-15 ENCOUNTER — Encounter: Payer: Self-pay | Admitting: Internal Medicine

## 2017-03-15 DIAGNOSIS — C9000 Multiple myeloma not having achieved remission: Secondary | ICD-10-CM | POA: Insufficient documentation

## 2017-03-15 DIAGNOSIS — N1832 Chronic kidney disease, stage 3b: Secondary | ICD-10-CM | POA: Insufficient documentation

## 2017-03-15 DIAGNOSIS — N183 Chronic kidney disease, stage 3 unspecified: Secondary | ICD-10-CM | POA: Insufficient documentation

## 2017-03-15 DIAGNOSIS — E538 Deficiency of other specified B group vitamins: Secondary | ICD-10-CM

## 2017-03-15 LAB — CBC
HCT: 29.7 % — ABNORMAL LOW (ref 35.0–47.0)
HEMOGLOBIN: 9.8 g/dL — AB (ref 12.0–16.0)
MCH: 29 pg (ref 26.0–34.0)
MCHC: 33 g/dL (ref 32.0–36.0)
MCV: 87.8 fL (ref 80.0–100.0)
Platelets: 258 10*3/uL (ref 150–440)
RBC: 3.38 MIL/uL — ABNORMAL LOW (ref 3.80–5.20)
RDW: 16.2 % — ABNORMAL HIGH (ref 11.5–14.5)
WBC: 5.2 10*3/uL (ref 3.6–11.0)

## 2017-03-15 LAB — VITAMIN B12: Vitamin B-12: 410 pg/mL (ref 180–914)

## 2017-03-15 NOTE — Telephone Encounter (Signed)
Ginger with [facility] stated that pt was discharged for home health because her wounds are healed. Ginger stated that pt was in agreement and advised that if wounds returns she can contact us and wound care center and be re-admitted to the program if needed. Please advise. Thanks TNP

## 2017-03-16 LAB — INTRINSIC FACTOR ANTIBODIES: INTRINSIC FACTOR: 1 [AU]/ml (ref 0.0–1.1)

## 2017-03-17 LAB — ANTI-PARIETAL ANTIBODY: Parietal Cell Antibody-IgG: 1.4 Units (ref 0.0–20.0)

## 2017-03-21 ENCOUNTER — Other Ambulatory Visit: Payer: Self-pay | Admitting: *Deleted

## 2017-03-21 NOTE — Telephone Encounter (Signed)
Pharmacy requesting a ninety day rx. 

## 2017-03-22 MED ORDER — AMLODIPINE BESYLATE 5 MG PO TABS: 5.0000 mg | ORAL_TABLET | Freq: Every day | ORAL | 5 refills | Status: DC

## 2017-03-26 ENCOUNTER — Other Ambulatory Visit: Payer: Self-pay | Admitting: Family Medicine

## 2017-04-05 ENCOUNTER — Other Ambulatory Visit: Payer: Self-pay | Admitting: Family Medicine

## 2017-04-05 DIAGNOSIS — R609 Edema, unspecified: Secondary | ICD-10-CM

## 2017-05-28 NOTE — Progress Notes (Signed)
[facility] day:  05/29/2017   Chief Complaint: [name] is a 72 y.o. female with stage II multiple myeloma who is seen for 3 month assessment.  HPI: The patient was last seen in the medical oncology clinic on 03/01/2017.  At that time, patient was doing well.  Patient's legs were healing with Hydrofera Blue and triple compression wrappings.  Patient's RIGHT and completely healed.  Wound care services was scheduled to and soon. Hemoglobin 9.6, hematocrit 28.6, and platelets 255,000. WBC 4900 ([place] 2600). M-spike was 0.  Patient had recently started on oral B12 supplementation.  Patient returned to the clinic on 03/15/2017 for additional lab studies.  WBC was 5200.  Hemoglobin was 9.8, hematocrit 29.7, MCV 87.8, and platelets 258,000.  B12 level had improved to 410 (previously 154).  Intrinsic factor and antiparietal antibodies were both normal.  Patient continued on oral B12 500 mcg supplement daily.  She was seen in follow-up consult on 03/14/2017 in the interim, by Dr. Harrell Gave End (cardiology). Patient was off of Entresto therapy due to cost.  She was found to be stable on ACEI (lisinopril) and diuretic (spironolactone) therapy.  Renal function was stable.  Plans were to follow every 3 month BMPs due to her current medication regimen.  Patient is to follow-up with cardiology in 6 months.  In the interim, patient is doing well overall. She complains of knee pains "right before it rains". She has no other acute concerns. Energy level is stable. She has no B symptoms. She has not experienced any recent infections. Patient is more active. She enjoys being outside riding her golf cart to look at the land and wildlife.   Patient denies bleeding; no hematochezia, melena, or gross hematuria. She denies any new areas of bruising or palpable adenopathy.   Patient is eating well. Her weight is up 2 pounds. She denies pain in the clinic today.    Past  Medical History:  Diagnosis Date  . Aortic stenosis   . Asthma   . Coronary artery disease   . Diabetes mellitus without complication ([place])   . History of measles   . Hypertension   . Nonischemic cardiomyopathy ([place])   . Pulmonary hypertension ([place])     Past Surgical History:  Procedure Laterality Date  . Cyst(solitary) of breast:removed    . RIGHT/LEFT HEART CATH AND CORONARY ANGIOGRAPHY N/A 03/07/2016   Procedure: Right/Left Heart Cath and Coronary Angiography;  Surgeon: Nelva Bush, MD;  Location: [facility];  Service: Cardiovascular;  Laterality: N/A;  . TUBAL LIGATION      Family History  Problem Relation Age of Onset  . Cancer Mother        Uterine cancer  . Diabetes Mother   . Heart disease Mother   . Heart failure Mother   . Parkinson's disease Father   . Cancer Sister        breast cancer  . Diabetes Brother        Non-insulin Dependent Diabetes Mellitus  . Mesothelioma Brother   . Hypertension Sister   . Diabetes Sister        Non-insulin dependent Diabetes Mellitus    Social History:  reports that she has never smoked. She has never used smokeless tobacco. She reports that she does not drink alcohol or use drugs.  She is originally from [place]  Her husband was born in [place], [place].  They met in middle school in  [place].  She has been married to her husband x 52 years.  The patient is accompanied by her husband, [name], today.  Allergies:  Allergies  Allergen Reactions  . Hydrochlorothiazide     Leg Cramps  . Penicillins Rash    Current Medications: Current Outpatient Medications  Medication Sig Dispense Refill  . amLODipine (NORVASC) 5 MG tablet Take 1 tablet (5 mg total) by mouth daily. 30 tablet 5  . aspirin EC 81 MG tablet Take 1 tablet (81 mg total) by mouth daily. 90 tablet 3  . cyanocobalamin 500 MCG tablet Take 500 mcg by mouth daily.    . fluticasone furoate-vilanterol (BREO ELLIPTA) 200-25 MCG/INH AEPB Inhale  1 puff into the lungs daily. FOR ASTHMA J45.20 1 each 5  . HYDROcodone-acetaminophen (NORCO) 10-325 MG tablet Take 1 tablet by mouth every 8 (eight) hours as needed. 90 tablet 0  . lovastatin (MEVACOR) 20 MG tablet TAKE 1 TABLET (20 MG TOTAL) BY MOUTH AT BEDTIME. 90 tablet 4  . meloxicam (MOBIC) 15 MG tablet TAKE 1 TABLET (15 MG TOTAL) BY MOUTH DAILY AS NEEDED FOR PAIN. TAKE WITH FOOD 90 tablet 3  . metFORMIN (GLUCOPHAGE-XR) 750 MG 24 hr tablet TAKE 2 TABLETS (1,500 MG TOTAL) BY MOUTH DAILY. 180 tablet 3  . silver sulfADIAZINE (SILVADENE) 1 % cream APPLY TO AFFECTED AREA DAILY 85 g 5  . spironolactone (ALDACTONE) 25 MG tablet TAKE 1 TABLET (25 MG TOTAL) BY MOUTH DAILY. 30 tablet 5  . traZODone (DESYREL) 100 MG tablet TAKE 1 TABLET BY MOUTH EVERY NIGHT AT BEDTIME AS NEEDED FOR INSOMNIA 90 tablet 4  . furosemide (LASIX) 40 MG tablet Take 1 tablet (40 mg total) by mouth daily. 90 tablet 3  . lisinopril (PRINIVIL,ZESTRIL) 20 MG tablet Take 1 tablet (20 mg total) daily by mouth. 90 tablet 3  . metoprolol tartrate (LOPRESSOR) 50 MG tablet Take 1 tablet (50 mg total) by mouth 2 (two) times daily. 180 tablet 3   No current facility-administered medications for this visit.     Review of Systems  Constitutional: Negative for diaphoresis, fever, malaise/fatigue and weight loss (weight up 2 pounds).       "I'm feeling pretty good"  HENT: Negative.   Eyes: Negative.   Respiratory: Negative for cough, hemoptysis, sputum production and shortness of breath.   Cardiovascular: Positive for leg swelling (improved). Negative for chest pain, palpitations, orthopnea and PND.  Gastrointestinal: Negative for abdominal pain, blood in stool, constipation, diarrhea, melena, nausea and vomiting.  Genitourinary: Negative for dysuria, frequency, hematuria and urgency.  Musculoskeletal: Positive for back pain (lower - chronic in nature) and joint pain (knees; associated with weather). Negative for falls and myalgias.   Skin: Negative for itching and rash.  Neurological: Negative for dizziness, tremors, weakness and headaches.  Endo/Heme/Allergies: Does not bruise/bleed easily.  Psychiatric/Behavioral: Negative for depression, memory loss and suicidal ideas. The patient is nervous/anxious. The patient does not have insomnia.   All other systems reviewed and are negative.  Performance status (ECOG): 1 - Symptomatic but completely ambulatory  Physical Exam: Blood pressure 121/73, pulse 60, resp. rate 18, weight 207 lb 14.4 oz (94.3 kg). GENERAL:  Well developed, well nourished, woman sitting comfortably in a wheelchair in the exam room in no acute distress. MENTAL STATUS:  Alert and oriented to person, place and time. HEAD:  Gray/blonde styled hair.  Normocephalic, atraumatic, face symmetric, no Cushingoid features. EYES:  Glasses.  Blue eyes.  Pupils equal round and reactive to light and  accomodation.  No conjunctivitis or scleral icterus. ENT:  Oropharynx clear without lesion.  Tongue normal. Mucous membranes moist.  RESPIRATORY:  Clear to auscultation without rales, wheezes or rhonchi. CARDIOVASCULAR:  Regular rate and rhythm without murmur, rub or gallop. ABDOMEN:  Soft, non-tender, with active bowel sounds, and no hepatosplenomegaly.  No masses. SKIN:  No rashes, ulcers or lesions. EXTREMITIES: Tender lower extremities (chronic).  Support hose in place.  No palpable cords. LYMPH NODES: No palpable cervical, supraclavicular, axillary or inguinal adenopathy  NEUROLOGICAL: Unremarkable. PSYCH:  Appropriate.    Appointment on 05/29/2017  Component Date Value Ref Range Status  . Retic Ct Pct 05/29/2017 PENDING  0.4 - 3.1 % Incomplete  . RBC. 05/29/2017 PENDING  3.87 - 5.11 MIL/uL Incomplete  . Retic Count, Absolute 05/29/2017 PENDING  19.0 - 186.0 K/uL Incomplete  . Sodium 05/29/2017 138  135 - 145 mmol/L Final  . Potassium 05/29/2017 4.6  3.5 - 5.1 mmol/L Final  . Chloride 05/29/2017 104  101 - 111  mmol/L Final  . CO2 05/29/2017 26  22 - 32 mmol/L Final  . Glucose, Bld 05/29/2017 123* 65 - 99 mg/dL Final  . BUN 05/29/2017 35* 6 - 20 mg/dL Final  . Creatinine, Ser 05/29/2017 1.26* 0.44 - 1.00 mg/dL Final  . Calcium 05/29/2017 10.0  8.9 - 10.3 mg/dL Final  . Total Protein 05/29/2017 8.5* 6.5 - 8.1 g/dL Final  . Albumin 05/29/2017 3.6  3.5 - 5.0 g/dL Final  . AST 05/29/2017 18  15 - 41 U/L Final  . ALT 05/29/2017 11* 14 - 54 U/L Final  . Alkaline Phosphatase 05/29/2017 61  38 - 126 U/L Final  . Total Bilirubin 05/29/2017 0.4  0.3 - 1.2 mg/dL Final  . GFR calc non Af Amer 05/29/2017 41* >60 mL/min Final  . GFR calc Af Amer 05/29/2017 48* >60 mL/min Final   Comment: (NOTE) The eGFR has been calculated using the CKD EPI equation. This calculation has not been validated in all clinical situations. eGFR's persistently <60 mL/min signify possible Chronic Kidney Disease.   Georgiann Hahn gap 05/29/2017 8  5 - 15 Final   Performed at [facility], [address] [place], [place] [address]  . WBC 05/29/2017 5.3  3.6 - 11.0 K/uL Final  . RBC 05/29/2017 3.24* 3.80 - 5.20 MIL/uL Final  . Hemoglobin 05/29/2017 9.8* 12.0 - 16.0 g/dL Final  . HCT 05/29/2017 29.0* 35.0 - 47.0 % Final  . MCV 05/29/2017 89.5  80.0 - 100.0 fL Final  . MCH 05/29/2017 30.1  26.0 - 34.0 pg Final  . MCHC 05/29/2017 33.7  32.0 - 36.0 g/dL Final  . RDW 05/29/2017 14.9* 11.5 - 14.5 % Final  . Platelets 05/29/2017 262  150 - 440 K/uL Final  . Neutrophils Relative % 05/29/2017 57  % Final  . Neutro Abs 05/29/2017 3.1  1.4 - 6.5 K/uL Final  . Lymphocytes Relative 05/29/2017 31  % Final  . Lymphs Abs 05/29/2017 1.6  1.0 - 3.6 K/uL Final  . Monocytes Relative 05/29/2017 8  % Final  . Monocytes Absolute 05/29/2017 0.4  0.2 - 0.9 K/uL Final  . Eosinophils Relative 05/29/2017 3  % Final  . Eosinophils Absolute 05/29/2017 0.1  0 - 0.7 K/uL Final  . Basophils Relative 05/29/2017 1  % Final  . Basophils Absolute 05/29/2017  0.0  0 - 0.1 K/uL Final   Performed at [facility], [address] [place], [place] [address]    Assessment:  KATTY FRETWELL is a 72 y.o. female with stage II myeloma.  SPEP on 01/25/2016 revealed a 0.9 gm/dL IgG monoclonal gammopathy.  She denied any bone pain or recurrent infections.  Calcium and creatinine were normal.  Urinalysis revealed 30+ protein.  Work-up on 02/14/2016 revealed a hematocrit of 29.1, hemoglobin 9.4, MCV 84.6, platelets 225,000, white count 5700 with an [place] of 3700.  SPEP revealed a 1.9 g/dL monoclonal spike.  Kappa free light chains were 237.2, lambda free light chains 38.7 with a ratio of 6.13 (0.26-1.65).  Beta 2-microglobulin was 3.9 (0.6-2.4).  BMP revealed a creatinine of 0.95 and calcium 9.0.  24 hour UPEP revealed 31 mg/24 hours of an IgG monoclonal protein with kappa light chain specificity.  Bence Jones protein was positive, kappa type.  Bone marrow on 02/29/2016 revealed a hypercellular marrow for age with plasmacytosis (plasma cells 10%).  There was trilineage hematopoiesis. Immunohistochemical stains highlighted the plasma cell component which generally showed a polyclonal staining pattern for kappa and lambda light chain although there was kappa light chain excess especially in several plasma cell clusters. The features were most suggestive of early plasma cell dyscrasia/neoplasm.  Cytogenetics were normal (46, XX).  Bone survey on 03/22/2016 revealed no focal lesion or acute bony abnormality.  PET scan on 03/31/2016 revealed no significant abnormal hypermetabolic activity in the head, neck, chest, abdomen, pelvis, or skeleton.  SPEP has been followed: 0.9 gm/dL on 01/25/2016, 1.9 on 02/14/2016, 1.7 on 03/24/2016, 0.7 on 05/16/2016, 0.8 on 07/14/2016, 1.6 on 09/14/2016, 1.1 on 11/14/2016, and 0 on 03/01/2017.   She has a normocytic anemia.  Work-up on 01/25/2016 and 02/14/2016 revealed a normal ferritin (90), iron studies, folate, and thyroid function  tests.  B12 was 312 (low normal) on 01/25/2016, but with a normal MMA on 02/14/2016,  thus ruling out B12 deficiency.  Reticulocyte count is inappropiately low.  She has B12 deficiency. Antiparietal and intrinsic factor antibodies normal, thus she does not have pernicious anemia. She started on oral B12 in early 01/2017.  B12 was 154 on 12/28/2016 and 410 on 03/15/2017.   Last colonoscopy was 2 years ago.  Guaiac cards were negative x 3 in 04/2016.  Cardiac catheterization on 03/07/2016 revealed non-obstructive coronary artery disease.  She has severe pulmonary artery hypertension with moderate to severely elevated right heart filling pressures and moderately elevated left heart filling pressures.   Symptomatically, patient is doing well. Her legs have improved. She uses compression stockings daily. No longer seeing the wound care center. She experiences knee pain "just before it rains". Hemoglobin 9.8, hematocrit 29.0, and platelets 262,000. WBC 5300 ([place] 3100). SPEP is pending.   Plan: 1. Labs today: CBC with differential, CMP, TSH, SPEP, FLCA. 2. Review previous labs. Antiparietal and intrinsic factor antibodies normal, thus no pernicious anemia.  B12 improving on oral supplementation. Continue  500 mcg B12 supplement daily.  3. Discuss anemia. Plan for ongoing close surveillance.  Review indications for treatment of both anemia and myeloma. 4. RTC in 3 months for MD assessment and labs (CBC with diff, CMP, SPEP). Anticipate pushing follow up out to every 6 months if M-spike remains undetectable.    Honor Loh, NP  05/29/2017, 11:24 AM   I saw and evaluated the patient, participating in the key portions of the service and reviewing pertinent diagnostic studies and records.  I reviewed the nurse practitioner's note and agree with the findings and the plan.  The assessment and plan were discussed with the patient.  Several questions  were asked by the patient and answered.   Nolon Stalls,  MD 05/29/2017,11:24 AM

## 2017-05-29 ENCOUNTER — Encounter: Payer: Self-pay | Admitting: Hematology and Oncology

## 2017-05-29 ENCOUNTER — Inpatient Hospital Stay: Payer: Medicare Other | Admitting: Hematology and Oncology

## 2017-05-29 ENCOUNTER — Other Ambulatory Visit: Payer: Self-pay

## 2017-05-29 ENCOUNTER — Inpatient Hospital Stay: Payer: Medicare Other | Attending: Hematology and Oncology

## 2017-05-29 ENCOUNTER — Other Ambulatory Visit: Payer: Self-pay | Admitting: Hematology and Oncology

## 2017-05-29 VITALS — BP 121/73 | HR 60 | Resp 18 | Wt 207.9 lb

## 2017-05-29 DIAGNOSIS — M7989 Other specified soft tissue disorders: Secondary | ICD-10-CM

## 2017-05-29 DIAGNOSIS — Z7189 Other specified counseling: Secondary | ICD-10-CM

## 2017-05-29 DIAGNOSIS — Z79899 Other long term (current) drug therapy: Secondary | ICD-10-CM | POA: Insufficient documentation

## 2017-05-29 DIAGNOSIS — D649 Anemia, unspecified: Secondary | ICD-10-CM | POA: Diagnosis not present

## 2017-05-29 DIAGNOSIS — C9000 Multiple myeloma not having achieved remission: Secondary | ICD-10-CM | POA: Diagnosis not present

## 2017-05-29 DIAGNOSIS — E538 Deficiency of other specified B group vitamins: Secondary | ICD-10-CM

## 2017-05-29 LAB — COMPREHENSIVE METABOLIC PANEL
ALT: 11 U/L — AB (ref 14–54)
AST: 18 U/L (ref 15–41)
Albumin: 3.6 g/dL (ref 3.5–5.0)
Alkaline Phosphatase: 61 U/L (ref 38–126)
Anion gap: 8 (ref 5–15)
BUN: 35 mg/dL — ABNORMAL HIGH (ref 6–20)
CHLORIDE: 104 mmol/L (ref 101–111)
CO2: 26 mmol/L (ref 22–32)
CREATININE: 1.26 mg/dL — AB (ref 0.44–1.00)
Calcium: 10 mg/dL (ref 8.9–10.3)
GFR, EST AFRICAN AMERICAN: 48 mL/min — AB (ref >60)
GFR, EST NON AFRICAN AMERICAN: 41 mL/min — AB (ref >60)
Glucose, Bld: 123 mg/dL — ABNORMAL HIGH (ref 65–99)
Potassium: 4.6 mmol/L (ref 3.5–5.1)
Sodium: 138 mmol/L (ref 135–145)
Total Bilirubin: 0.4 mg/dL (ref 0.3–1.2)
Total Protein: 8.5 g/dL — ABNORMAL HIGH (ref 6.5–8.1)

## 2017-05-29 LAB — CBC WITH DIFFERENTIAL/PLATELET
Basophils Absolute: 0 10*3/uL (ref 0–0.1)
Basophils Relative: 1 %
EOS ABS: 0.1 10*3/uL (ref 0–0.7)
Eosinophils Relative: 3 %
HCT: 29 % — ABNORMAL LOW (ref 35.0–47.0)
HEMOGLOBIN: 9.8 g/dL — AB (ref 12.0–16.0)
LYMPHS ABS: 1.6 10*3/uL (ref 1.0–3.6)
Lymphocytes Relative: 31 %
MCH: 30.1 pg (ref 26.0–34.0)
MCHC: 33.7 g/dL (ref 32.0–36.0)
MCV: 89.5 fL (ref 80.0–100.0)
Monocytes Absolute: 0.4 10*3/uL (ref 0.2–0.9)
Monocytes Relative: 8 %
NEUTROS PCT: 57 %
Neutro Abs: 3.1 10*3/uL (ref 1.4–6.5)
Platelets: 262 10*3/uL (ref 150–440)
RBC: 3.24 MIL/uL — AB (ref 3.80–5.20)
RDW: 14.9 % — ABNORMAL HIGH (ref 11.5–14.5)
WBC: 5.3 10*3/uL (ref 3.6–11.0)

## 2017-05-29 LAB — TSH: TSH: 0.482 u[IU]/mL (ref 0.350–4.500)

## 2017-05-29 LAB — RETICULOCYTES
RBC.: 3.31 MIL/uL — ABNORMAL LOW (ref 3.80–5.20)
Retic Count, Absolute: 43 10*3/uL (ref 19.0–183.0)
Retic Ct Pct: 1.3 % (ref 0.4–3.1)

## 2017-05-29 NOTE — Progress Notes (Signed)
Patient here today for follow up. No concerns voiced.  °

## 2017-05-31 LAB — KAPPA/LAMBDA LIGHT CHAINS
Kappa free light chain: 332.3 mg/L — ABNORMAL HIGH (ref 3.3–19.4)
Kappa, lambda light chain ratio: 9.31 — ABNORMAL HIGH (ref 0.26–1.65)
Lambda free light chains: 35.7 mg/L — ABNORMAL HIGH (ref 5.7–26.3)

## 2017-06-05 ENCOUNTER — Telehealth: Payer: Self-pay | Admitting: *Deleted

## 2017-06-05 NOTE — Telephone Encounter (Signed)
Called patient to inquire when he can bring his wife in for a lab appointment for SPEP draw.  He states he can come on Wednesday, June 5 @ 8:30 am.  Durene Cal message to scheduling.

## 2017-06-05 NOTE — Telephone Encounter (Signed)
-----   Message from Karen Kitchens, NP sent at 06/04/2017  5:37 PM EDT ----- Regarding: RE: SPEP not drawn Needs to come have this done.   [name]  ----- Message ----- From: Lequita Asal, MD Sent: 06/03/2017   4:40 PM To: Karen Kitchens, NP Subject: SPEP not drawn

## 2017-06-06 ENCOUNTER — Other Ambulatory Visit: Payer: Self-pay

## 2017-06-06 ENCOUNTER — Inpatient Hospital Stay: Payer: Medicare Other | Attending: Hematology and Oncology

## 2017-06-06 DIAGNOSIS — C9000 Multiple myeloma not having achieved remission: Secondary | ICD-10-CM | POA: Diagnosis not present

## 2017-06-08 LAB — PROTEIN ELECTROPHORESIS, SERUM
A/G Ratio: 0.7 (ref 0.7–1.7)
Albumin ELP: 3.2 g/dL (ref 2.9–4.4)
Alpha-1-Globulin: 0.2 g/dL (ref 0.0–0.4)
Alpha-2-Globulin: 0.8 g/dL (ref 0.4–1.0)
Beta Globulin: 1.1 g/dL (ref 0.7–1.3)
Gamma Globulin: 2.4 g/dL — ABNORMAL HIGH (ref 0.4–1.8)
Globulin, Total: 4.5 g/dL — ABNORMAL HIGH (ref 2.2–3.9)
Total Protein ELP: 7.7 g/dL (ref 6.0–8.5)

## 2017-06-13 ENCOUNTER — Other Ambulatory Visit: Payer: Self-pay

## 2017-06-13 ENCOUNTER — Telehealth: Payer: Self-pay | Admitting: *Deleted

## 2017-06-13 MED ORDER — FUROSEMIDE 40 MG PO TABS: 40.0000 mg | ORAL_TABLET | Freq: Every day | ORAL | 3 refills | Status: DC

## 2017-06-13 NOTE — Telephone Encounter (Addendum)
Contacted patient- results reviewed with patient. Pt expressed appreciation for calling her with these results.  ----- Message from Lequita Asal, MD sent at 06/08/2017  3:46 PM EDT ----- Regarding: Please call patient or husband  M spike 0.

## 2017-06-13 NOTE — Telephone Encounter (Signed)
Refill sent for Furosemide 40 mg take one tablet by mouth daily.

## 2017-06-21 ENCOUNTER — Encounter: Payer: Self-pay | Admitting: Family Medicine

## 2017-06-21 ENCOUNTER — Ambulatory Visit (INDEPENDENT_AMBULATORY_CARE_PROVIDER_SITE_OTHER): Payer: Medicare Other | Admitting: Family Medicine

## 2017-06-21 VITALS — BP 104/64 | HR 64 | Temp 98.1°F | Resp 16

## 2017-06-21 DIAGNOSIS — E1121 Type 2 diabetes mellitus with diabetic nephropathy: Secondary | ICD-10-CM | POA: Diagnosis not present

## 2017-06-21 DIAGNOSIS — I10 Essential (primary) hypertension: Secondary | ICD-10-CM | POA: Diagnosis not present

## 2017-06-21 DIAGNOSIS — I509 Heart failure, unspecified: Secondary | ICD-10-CM

## 2017-06-21 LAB — POCT GLYCOSYLATED HEMOGLOBIN (HGB A1C)
Est. average glucose Bld gHb Est-mCnc: 154
HEMOGLOBIN A1C: 7 % — AB (ref 4.0–5.6)

## 2017-06-21 IMAGING — CT CT BIOPSY
2 series · 8 of 16 positions shown, 9 images · non-contrast
Comparison: none

INDICATION: Monoclonal gammopathy

[Series 2: i-spiral 5.0 b30f · axial · 0.61mm/px · z∈[-324,-276]mm · 3 of 29 slices shown, 4 images]
[im 8/29  soft-tissue]
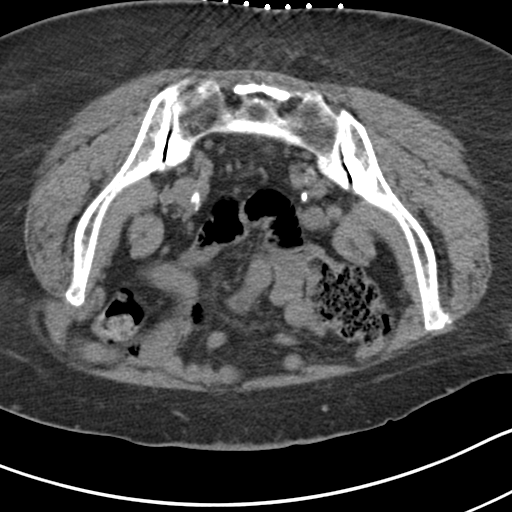
[im 8/29  bone]
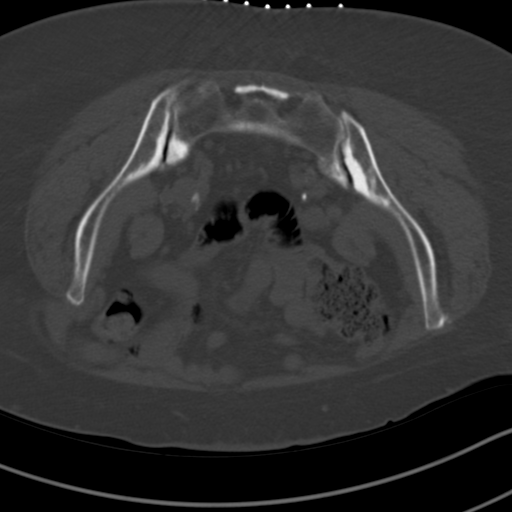
[im 15/29  soft-tissue]
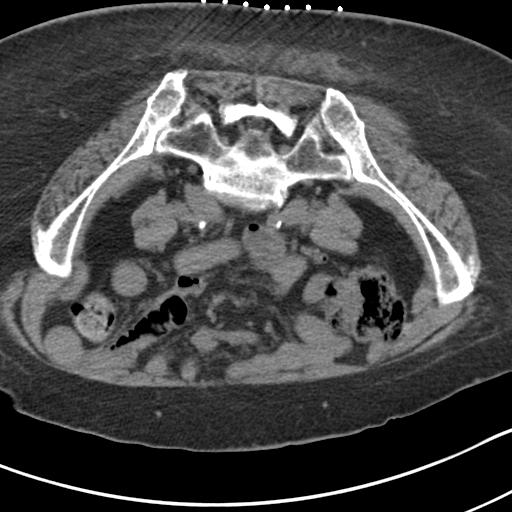
[im 22/29  soft-tissue]
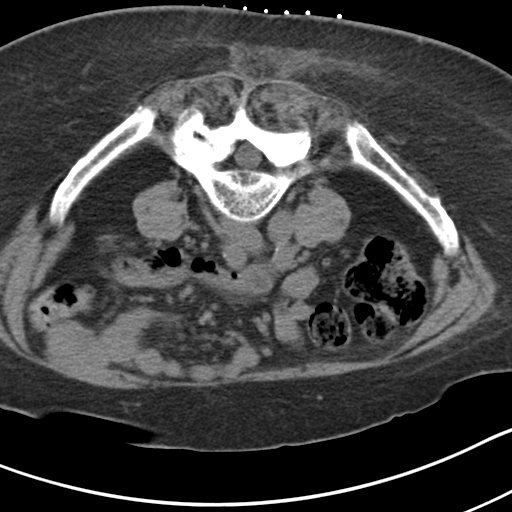

[Series 3: i-sequence 2.4 b30s · axial · 0.61mm/px · z∈[-311,-302]mm · 5 of 60 slices shown]
[im 18/60  soft-tissue]
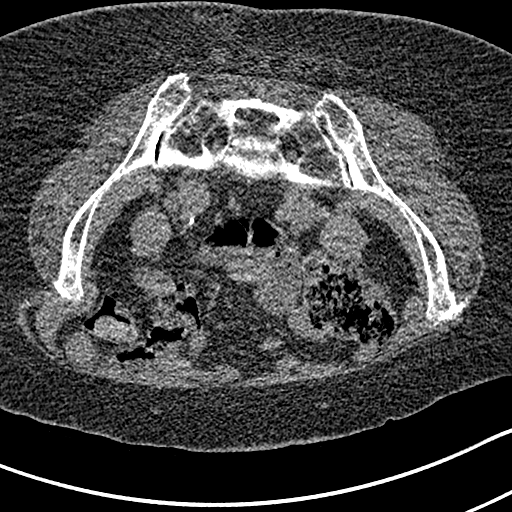
[im 30/60  soft-tissue]
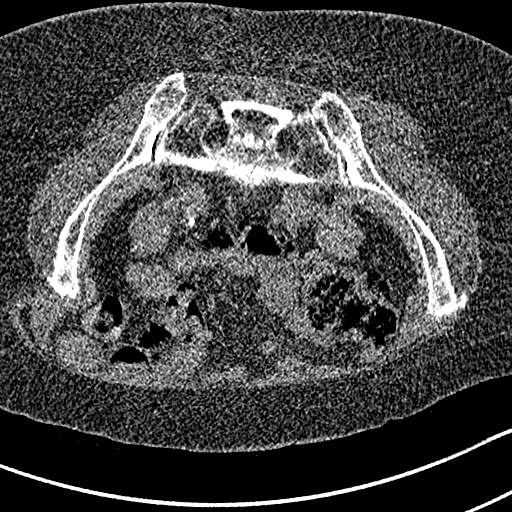
[im 42/60  soft-tissue]
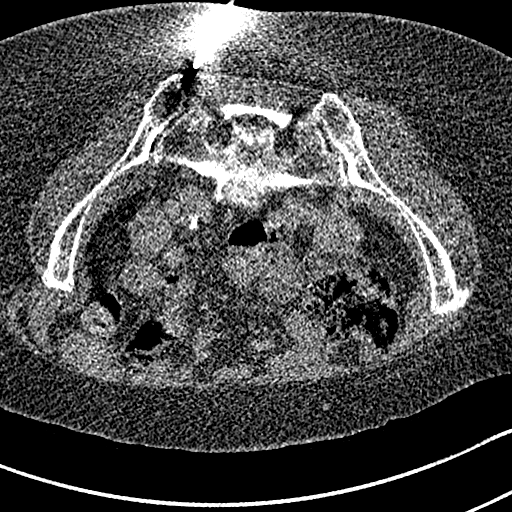
[im 48/60  soft-tissue]
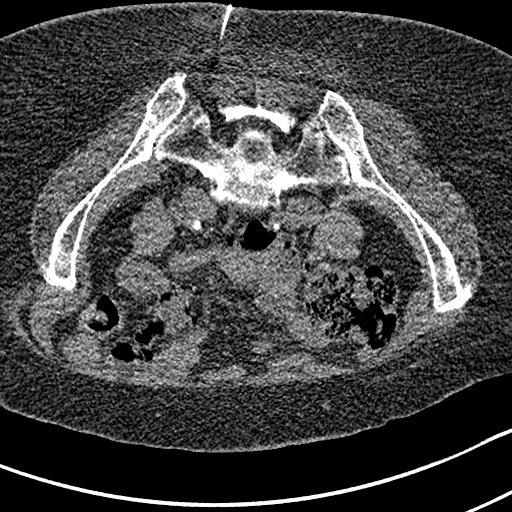
[im 54/60  soft-tissue]
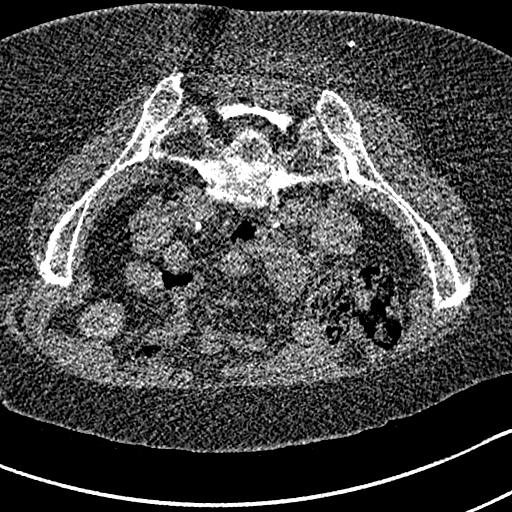

[8 of 16 positions shown; findings below may reference images not displayed]

EXAM:
CT BIOPSY

MEDICATIONS:
None.

ANESTHESIA/SEDATION:
None

FLUOROSCOPY TIME:  Not applicable

COMPLICATIONS:
None immediate.

PROCEDURE:
Informed written consent was obtained from the patient after a
thorough discussion of the procedural risks, benefits and
alternatives. All questions were addressed. Maximal Sterile Barrier
Technique was utilized including caps, mask, sterile gowns, sterile
gloves, sterile drape, hand hygiene and skin antiseptic. A timeout
was performed prior to the initiation of the procedure.

Utilizing 1% xylocaine as local anesthetic and CT fluoroscopic
guidance a bone biopsy needle was placed into the left iliac wing
adjacent to the sacroiliac joint. Initial aspirates were obtained.
Subsequently 2 marrow cores were obtained and sent for pathologic
evaluation. The needle was removed. Patient tolerated the procedure
well and was returned her room in satisfactory condition.
IMPRESSION: Successful CT-guided bone marrow biopsy as described.

## 2017-06-21 MED ORDER — HYDROCODONE-ACETAMINOPHEN 10-325 MG PO TABS: 1.0000 | ORAL_TABLET | Freq: Three times a day (TID) | ORAL | 0 refills | Status: DC | PRN

## 2017-06-21 NOTE — Progress Notes (Signed)
Patient: [name] Female    DOB: [birth year]   72 y.o.   MRN: [id number] Visit Date: 06/21/2017  Today's Provider: Lelon Huh, MD   Chief Complaint  Patient presents with  . Follow-up  . Diabetes  . Hypertension   Subjective:    HPI   Diabetes Mellitus Type II, Follow-up:   Lab Results  Component Value Date   HGBA1C 7.0 (A) 06/21/2017   HGBA1C 6.8 03/08/2017   HGBA1C 6.9 (H) 12/21/2016   Last seen for diabetes 3 months ago.  Management since then includes; no changes. She reports good compliance with treatment. She is not having side effects. none Current symptoms include none and have been unchanged. Home blood sugar records: fasting range: not checking  Episodes of hypoglycemia? no   Current Insulin Regimen: n/a Most Recent Eye Exam: 01/03/2017 Weight trend: stable Prior visit with dietician: no Current diet: well balanced Current exercise: none  ----------------------------------------------------------------   Hypertension, follow-up:  BP Readings from Last 3 Encounters:  06/21/17 104/64  05/29/17 121/73  03/14/17 (!) 112/58    She was last seen for hypertension 3 months ago.  BP at that visit was 130/58. Management since that visit includes; no changes.She reports good compliance with treatment. She is not having side effects. none She is not exercising. She is adherent to low salt diet.   Outside blood pressures are normal/good. She is experiencing none.  Patient denies none.   Cardiovascular risk factors include diabetes mellitus.  Use of agents associated with hypertension: none ----------------------------------------------------------------  Chronic heart failure ([place]) From 03/08/2017-no changes. Continue current medications.  Followed up Dr. Saunders Revel 03/14/2017 and continues to follow up every 6 months.  Now on 40mg  furosemide daily .   Edema, unspecified type From 03/08/2017-Encourage increase physical activity as tolerated. Is  wearing compression stockings consistently and wounds on legs are healing. complains of having to urinate every 15 minutes at night if she takes furosemide, which she takes with spironolactone in the morning.     Allergies  Allergen Reactions  . Hydrochlorothiazide     Leg Cramps  . Penicillins Rash     Current Outpatient Medications:  .  amLODipine (NORVASC) 5 MG tablet, Take 1 tablet (5 mg total) by mouth daily., Disp: 30 tablet, Rfl: 5 .  aspirin EC 81 MG tablet, Take 1 tablet (81 mg total) by mouth daily., Disp: 90 tablet, Rfl: 3 .  cyanocobalamin 500 MCG tablet, Take 500 mcg by mouth daily., Disp: , Rfl:  .  fluticasone furoate-vilanterol (BREO ELLIPTA) 200-25 MCG/INH AEPB, Inhale 1 puff into the lungs daily. FOR ASTHMA J45.20, Disp: 1 each, Rfl: 5 .  furosemide (LASIX) 40 MG tablet, Take 1 tablet (40 mg total) by mouth daily., Disp: 90 tablet, Rfl: 3 .  HYDROcodone-acetaminophen (NORCO) 10-325 MG tablet, Take 1 tablet by mouth every 8 (eight) hours as needed., Disp: 90 tablet, Rfl: 0 .  lovastatin (MEVACOR) 20 MG tablet, TAKE 1 TABLET (20 MG TOTAL) BY MOUTH AT BEDTIME., Disp: 90 tablet, Rfl: 4 .  meloxicam (MOBIC) 15 MG tablet, TAKE 1 TABLET (15 MG TOTAL) BY MOUTH DAILY AS NEEDED FOR PAIN. TAKE WITH FOOD, Disp: 90 tablet, Rfl: 3 .  metFORMIN (GLUCOPHAGE-XR) 750 MG 24 hr tablet, TAKE 2 TABLETS (1,500 MG TOTAL) BY MOUTH DAILY., Disp: 180 tablet, Rfl: 3 .  silver sulfADIAZINE (SILVADENE) 1 % cream, APPLY TO AFFECTED AREA DAILY, Disp: 85 g, Rfl: 5 .  spironolactone (ALDACTONE) 25 MG tablet,  TAKE 1 TABLET (25 MG TOTAL) BY MOUTH DAILY., Disp: 30 tablet, Rfl: 5 .  traZODone (DESYREL) 100 MG tablet, TAKE 1 TABLET BY MOUTH EVERY NIGHT AT BEDTIME AS NEEDED FOR INSOMNIA, Disp: 90 tablet, Rfl: 4 .  lisinopril (PRINIVIL,ZESTRIL) 20 MG tablet, Take 1 tablet (20 mg total) daily by mouth., Disp: 90 tablet, Rfl: 3 .  metoprolol tartrate (LOPRESSOR) 50 MG tablet, Take 1 tablet (50 mg total) by mouth 2  (two) times daily., Disp: 180 tablet, Rfl: 3  Review of Systems  Constitutional: Negative for appetite change, chills, fatigue and fever.  Respiratory: Negative for chest tightness and shortness of breath.   Cardiovascular: Negative for chest pain and palpitations.  Gastrointestinal: Negative for abdominal pain, nausea and vomiting.  Neurological: Negative for dizziness and weakness.    Social History   Tobacco Use  . Smoking status: Never Smoker  . Smokeless tobacco: Never Used  Substance Use Topics  . Alcohol use: No   Objective:   BP 104/64 (BP Location: Left Arm, Patient Position: Sitting, Cuff Size: Large)   Pulse 64   Temp 98.1 F (36.7 C) (Oral)   Resp 16   SpO2 96%  Vitals:   06/21/17 1052  BP: 104/64  Pulse: 64  Resp: 16  Temp: 98.1 F (36.7 C)  TempSrc: Oral  SpO2: 96%     Physical Exam   General Appearance:    Alert, cooperative, no distress, obses  Eyes:    PERRL, conjunctiva/corneas clear, EOM's intact       Lungs:     Clear to auscultation bilaterally, respirations unlabored  Heart:    Regular rate and rhythm. Trace pedal edema. No open sores on LEs.       Results for orders placed or performed in visit on 06/21/17  POCT glycosylated hemoglobin (Hb A1C)  Result Value Ref Range   Hemoglobin A1C 7.0 (A) 4.0 - 5.6 %   HbA1c, POC (prediabetic range)  5.7 - 6.4 %   HbA1c, POC (controlled diabetic range)  0.0 - 7.0 %   Est. average glucose Bld gHb Est-mCnc 154        Assessment & Plan:     1. Diabetes mellitus with nephropathy ([place]) Fairly well controlled, but a1c is up. Consider GLP1 agonist if a1c  Continues to rise.  - POCT glycosylated hemoglobin (Hb A1C)  2. Essential hypertension Well controlled on current medication regiment. Consider reducing amlodipine if remains well controlled.   3. Chronic congestive heart failure, unspecified heart failure type ([place]) Edema is greatly improved with normal EF on last echo. She continues to have  trouble with nocturia keeping her up all night. Advised she needs to continue at least 1/2 tablet furosemide daily, but to take full tablet if any dyspnea or increase in edema. Strongly encouraged to increase walking as much as tolerated.   Return in about 3 months (around 09/24/2017).        Lelon Huh, MD  [facility]

## 2017-06-21 NOTE — Patient Instructions (Addendum)
   You should walk an average of at least 30 minutes every day   Continue to take at least 1/2 tablet of furosemide every day. Take a full tablet if you have any shortness of breath of increase in swelling.

## 2017-06-28 ENCOUNTER — Ambulatory Visit: Payer: Self-pay | Admitting: Family Medicine

## 2017-07-03 DIAGNOSIS — H34831 Tributary (branch) retinal vein occlusion, right eye, with macular edema: Secondary | ICD-10-CM | POA: Diagnosis not present

## 2017-07-13 ENCOUNTER — Other Ambulatory Visit: Payer: Self-pay | Admitting: Family Medicine

## 2017-07-13 DIAGNOSIS — G4700 Insomnia, unspecified: Secondary | ICD-10-CM

## 2017-07-13 IMAGING — CR DG BONE SURVEY MET
1 series · 8 of 8 positions shown · non-contrast
Comparison: None.

CLINICAL DATA: Monoclonal gammopathy

EXAM:
METASTATIC BONE SURVEY

[Series 1: dg bone survey met · 0.14mm/px · 8 of 21 slices shown]
[im 1/21]
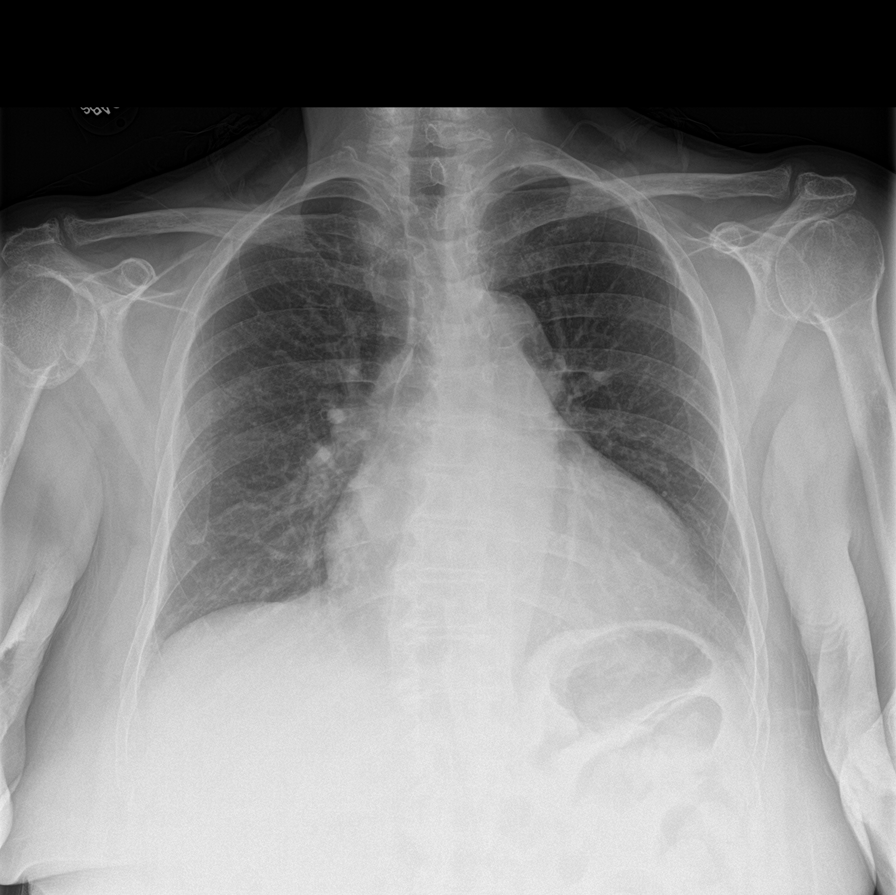
[im 3/21]
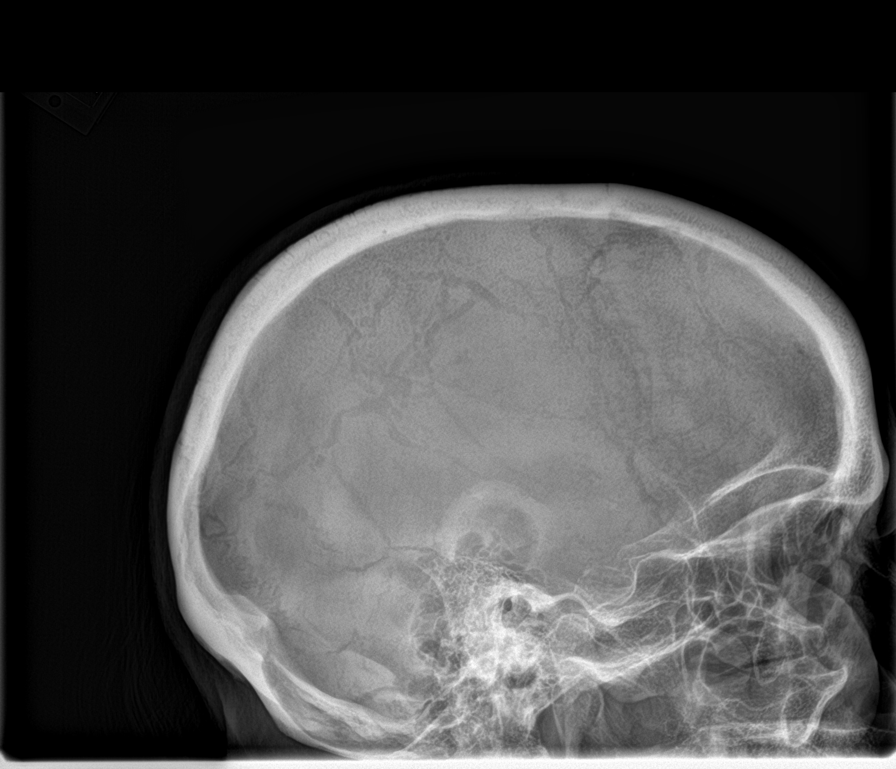
[im 6/21]
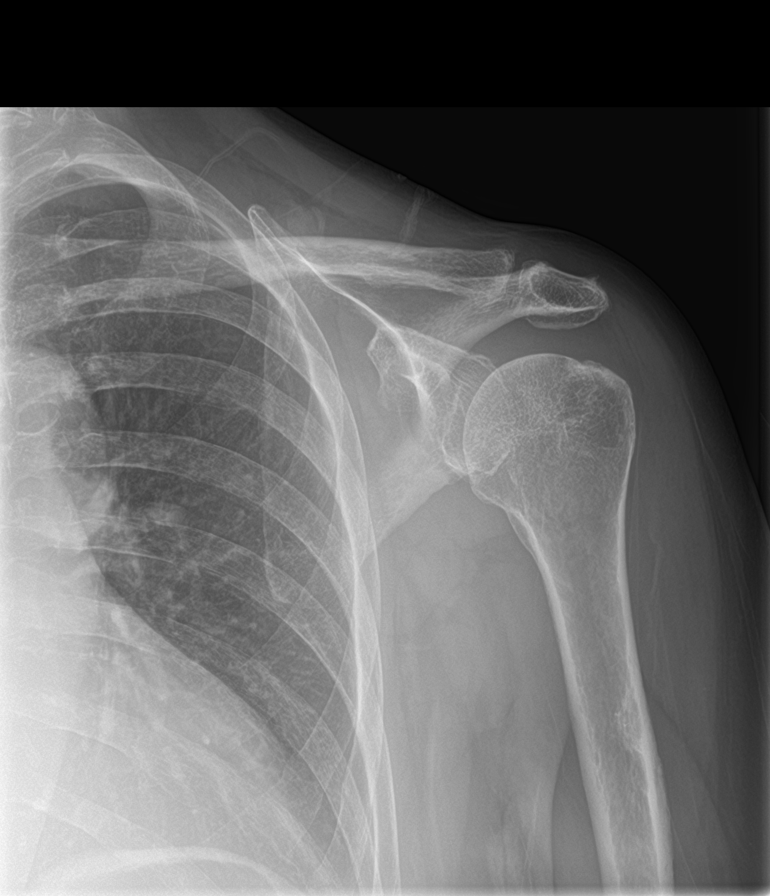
[im 9/21]
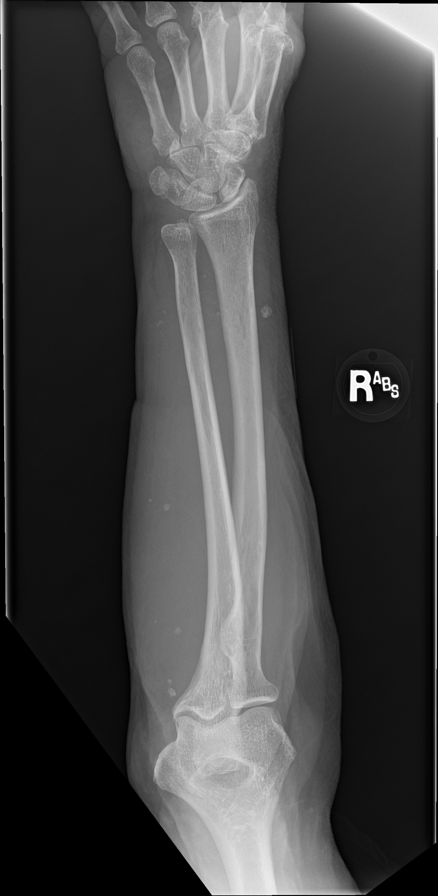
[im 12/21]
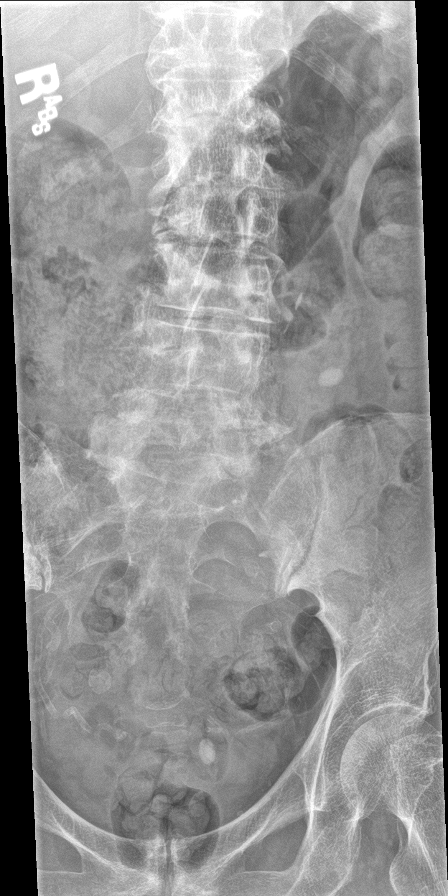
[im 15/21]
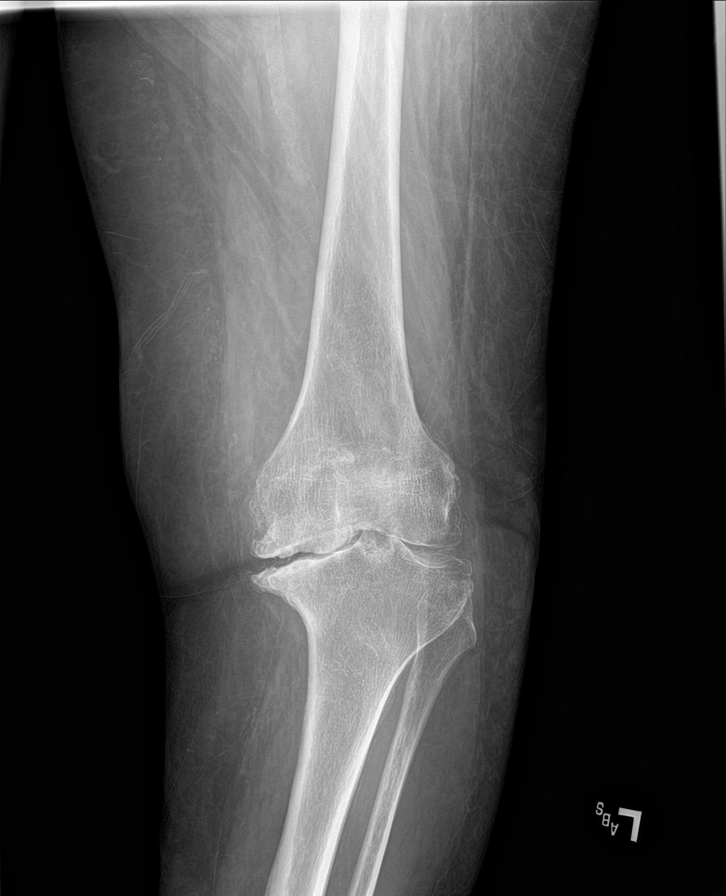
[im 18/21]
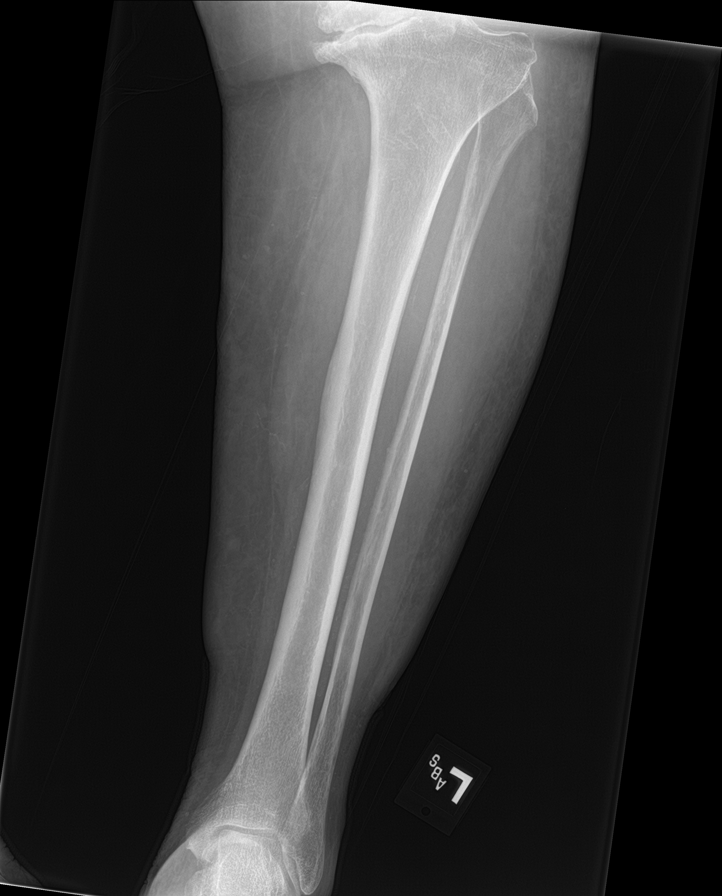
[im 21/21]
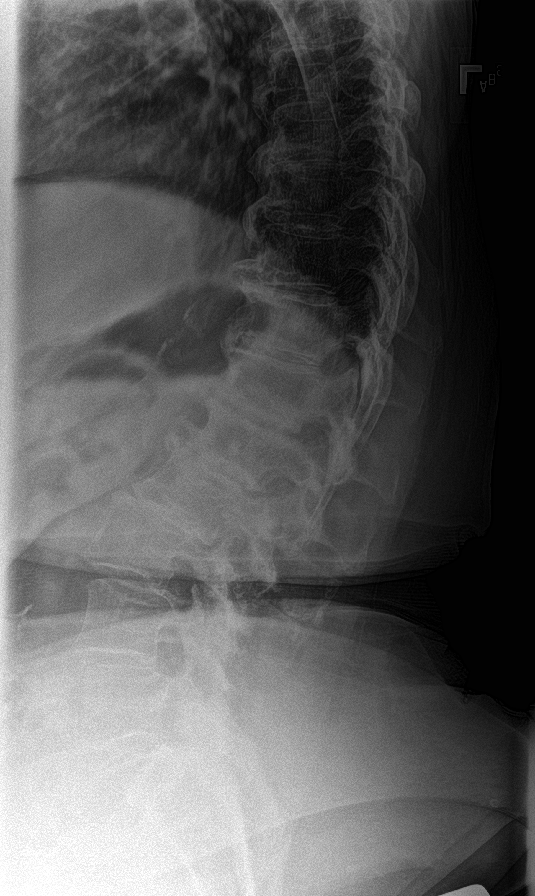

[8 of 8 positions shown; findings below may reference images not displayed]

FINDINGS: Heart is mildly enlarged.  Lungs are clear.  No visible rib lesion.

No focal lytic lesion. No acute bony abnormality. Advanced
degenerative changes in the knees bilaterally. Diffuse degenerative
spurring throughout the spine. Degenerative changes in the shoulders
and hips.
IMPRESSION: No focal lytic lesion or acute bony abnormality.

## 2017-07-22 IMAGING — CT NM PET IMAGE RESTAGE (PS) WHOLE BODY
1 of 9 series · 3 of 25 positions shown · non-contrast
Comparison: Bone survey of 03/22/2016 and several CT scans
including 03/17/2016

CLINICAL DATA: Initial Treatment strategy for multiple myeloma.

EXAM:
NUCLEAR MEDICINE PET WHOLE BODY
TECHNIQUE: 11.8 mCi F-18 FDG was injected intravenously. Full-ring PET imaging
was performed from the vertex to the feet after the radiotracer. CT
data was obtained and used for attenuation correction and anatomic
localization.
FASTING BLOOD GLUCOSE:  Value: 106 mg/dl

[Series 3: ct wb 5.0 b30f · axial · 5.0mm · 0.98mm/px · z∈[-1514,-668]mm · 3 of 564 slices shown]
[im 141/564  soft-tissue]
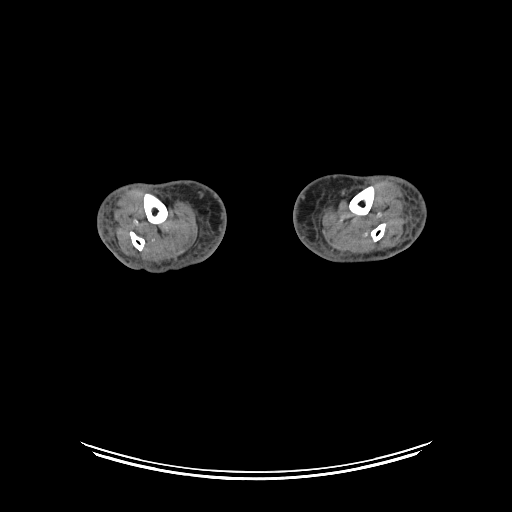
[im 282/564  soft-tissue]
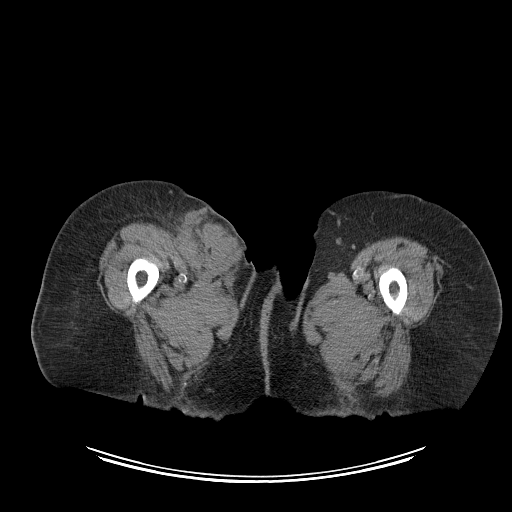
[im 423/564  soft-tissue]
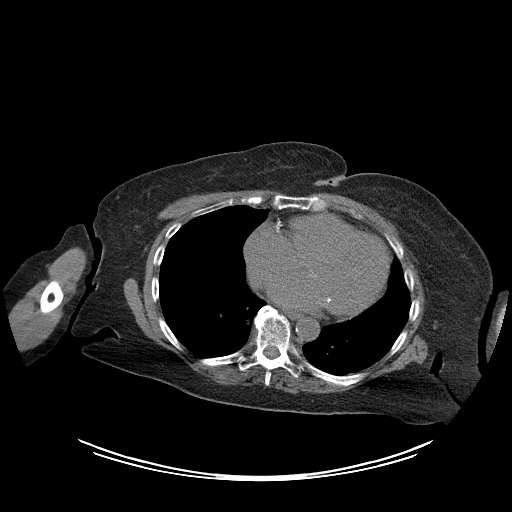

[3 of 25 positions shown; findings below may reference images not displayed]

FINDINGS: HEAD/NECK

No hypermetabolic activity in the scalp. No hypermetabolic cervical
lymph nodes.

Mucous retention cyst in the left maxillary sinus. Atherosclerotic
calcification of the carotid arteries.

CHEST

No hypermetabolic mediastinal or hilar nodes. No suspicious
pulmonary nodules on the CT data.

Coronary, aortic arch, and branch vessel atherosclerotic vascular
disease.

ABDOMEN/PELVIS

No abnormal hypermetabolic activity within the liver, pancreas,
adrenal glands, or spleen. No hypermetabolic lymph nodes in the
abdomen or pelvis.

Prominent stool throughout the colon favors constipation.
Physiologic activity in bowel.

SKELETON

No focal hypermetabolic activity to suggest skeletal metastasis.
Sclerosis along both sacroiliac joints compatible with sacroiliitis,
chronic in appearance. Degenerative arthropathy along the midfoot
and Lisfranc joint bilaterally. Prominent osteoarthritis of both
knees. Bilateral knee effusions.

Lumbar spondylosis and degenerative disc disease with anterior
subluxations at L3-4, L4-5, and L5-S1.

EXTREMITIES

No abnormal hypermetabolic activity in the lower extremities.

Hematoma tracks in the right groin subcutaneous tissues, multifocal
but with the upper component measuring 5.3 by 3.2 cm (formerly about
the same). Reduced density of the hematoma compared to the prior
exam from 03/17/2016. Subcutaneous edema in the calves, right
greater than left.
IMPRESSION: 1. No significant abnormal hypermetabolic activity is identified in
the head, neck, chest, abdomen, pelvis, or skeleton.
2. Evolutionary findings in the right groin/upper thigh 8 hematoma
with reduced density noted. Subcutaneous edema in the calves, right
greater the left.
3. Chronic bilateral sacroiliitis.
4. Lower lumbar spondylosis and degenerative disc disease.
5.  Prominent stool throughout the colon favors constipation.
6. Coronary, aortic arch, and branch vessel atherosclerotic vascular
disease.
7. Mucous retention cyst in the left maxillary sinus.

## 2017-08-28 ENCOUNTER — Inpatient Hospital Stay: Payer: Medicare Other | Attending: Hematology and Oncology

## 2017-08-28 ENCOUNTER — Other Ambulatory Visit: Payer: Self-pay | Admitting: Hematology and Oncology

## 2017-08-28 ENCOUNTER — Other Ambulatory Visit: Payer: Self-pay

## 2017-08-28 ENCOUNTER — Encounter: Payer: Self-pay | Admitting: Hematology and Oncology

## 2017-08-28 ENCOUNTER — Inpatient Hospital Stay (HOSPITAL_BASED_OUTPATIENT_CLINIC_OR_DEPARTMENT_OTHER): Payer: Medicare Other | Admitting: Hematology and Oncology

## 2017-08-28 VITALS — BP 140/84 | HR 60 | Temp 94.5°F | Resp 18 | Wt 214.7 lb

## 2017-08-28 DIAGNOSIS — E538 Deficiency of other specified B group vitamins: Secondary | ICD-10-CM | POA: Diagnosis not present

## 2017-08-28 DIAGNOSIS — Z79899 Other long term (current) drug therapy: Secondary | ICD-10-CM | POA: Diagnosis not present

## 2017-08-28 DIAGNOSIS — C9000 Multiple myeloma not having achieved remission: Secondary | ICD-10-CM | POA: Diagnosis not present

## 2017-08-28 LAB — CBC WITH DIFFERENTIAL/PLATELET
Basophils Absolute: 0 10*3/uL (ref 0–0.1)
Basophils Relative: 1 %
Eosinophils Absolute: 0.1 10*3/uL (ref 0–0.7)
Eosinophils Relative: 2 %
HCT: 28 % — ABNORMAL LOW (ref 35.0–47.0)
Hemoglobin: 9.3 g/dL — ABNORMAL LOW (ref 12.0–16.0)
Lymphocytes Relative: 32 %
Lymphs Abs: 1.6 10*3/uL (ref 1.0–3.6)
MCH: 30.2 pg (ref 26.0–34.0)
MCHC: 33.2 g/dL (ref 32.0–36.0)
MCV: 90.8 fL (ref 80.0–100.0)
Monocytes Absolute: 0.5 10*3/uL (ref 0.2–0.9)
Monocytes Relative: 9 %
Neutro Abs: 2.9 10*3/uL (ref 1.4–6.5)
Neutrophils Relative %: 56 %
Platelets: 238 10*3/uL (ref 150–440)
RBC: 3.08 MIL/uL — ABNORMAL LOW (ref 3.80–5.20)
RDW: 14.3 % (ref 11.5–14.5)
WBC: 5.2 10*3/uL (ref 3.6–11.0)

## 2017-08-28 LAB — COMPREHENSIVE METABOLIC PANEL
ALT: 10 U/L (ref 0–44)
AST: 19 U/L (ref 15–41)
Albumin: 3.6 g/dL (ref 3.5–5.0)
Alkaline Phosphatase: 60 U/L (ref 38–126)
Anion gap: 8 (ref 5–15)
BUN: 40 mg/dL — ABNORMAL HIGH (ref 8–23)
CO2: 26 mmol/L (ref 22–32)
Calcium: 10 mg/dL (ref 8.9–10.3)
Chloride: 104 mmol/L (ref 98–111)
Creatinine, Ser: 1.36 mg/dL — ABNORMAL HIGH (ref 0.44–1.00)
GFR calc Af Amer: 44 mL/min — ABNORMAL LOW (ref >60)
GFR calc non Af Amer: 38 mL/min — ABNORMAL LOW (ref >60)
Glucose, Bld: 115 mg/dL — ABNORMAL HIGH (ref 70–99)
Potassium: 4.7 mmol/L (ref 3.5–5.1)
Sodium: 138 mmol/L (ref 135–145)
Total Bilirubin: 0.5 mg/dL (ref 0.3–1.2)
Total Protein: 8.8 g/dL — ABNORMAL HIGH (ref 6.5–8.1)

## 2017-08-28 NOTE — Progress Notes (Signed)
[facility] day:  08/28/2017   Chief Complaint: [name] is a 72 y.o. female with stage II multiple myeloma who is seen for 3 month assessment.  HPI: The patient was last seen in the medical oncology clinic on 05/29/2017.  At that time, she was doing well. Her legs had improved. She was using compression stockings daily.  She was no longer seeing the wound care center. She had knee pain "just before it rains". Hemoglobin was 9.8, hematocrit 29.0, and platelets 262,000. WBC 5300 ([place] 3100). SPEP revealed no monoclonal protein.  Kappa free light chains were 332.3, lambda free light chains 35.7 and ratio 9.31 (high).  TSH was 0.482.  During the interim, patient is doing "just fine". Wounds to lower extremities have resolved. Patient wearing compression stockings daily while out of bed. Patient denies that she has experienced any B symptoms. She denies any interval infections. Patient complains of pain and numbness in her LEFT hand and wrist. She has no other numbness or tingling anywhere else. She has continues pain in her knees "just before it rains", as well as pain in her lower back.  Patient advises that she maintains an adequate appetite. She is eating well. Weight today is 214 lb 11.2 oz (97.4 kg), which compared to her last visit to the clinic, represents a 7 pound increase.   Patient denies pain in the clinic today.   Past Medical History:  Diagnosis Date  . Aortic stenosis   . Asthma   . Coronary artery disease   . Diabetes mellitus without complication ([place])   . History of measles   . Hypertension   . Nonischemic cardiomyopathy ([place])   . Pulmonary hypertension ([place])     Past Surgical History:  Procedure Laterality Date  . Cyst(solitary) of breast:removed    . RIGHT/LEFT HEART CATH AND CORONARY ANGIOGRAPHY N/A 03/07/2016   Procedure: Right/Left Heart Cath and Coronary Angiography;  Surgeon: Nelva Bush, MD;  Location: [facility];  Service: Cardiovascular;  Laterality: N/A;  . TUBAL LIGATION      Family History  Problem Relation Age of Onset  . Cancer Mother        Uterine cancer  . Diabetes Mother   . Heart disease Mother   . Heart failure Mother   . Parkinson's disease Father   . Cancer Sister        breast cancer  . Diabetes Brother        Non-insulin Dependent Diabetes Mellitus  . Mesothelioma Brother   . Hypertension Sister   . Diabetes Sister        Non-insulin dependent Diabetes Mellitus    Social History:  reports that she has never smoked. She has never used smokeless tobacco. She reports that she does not drink alcohol or use drugs.  She is originally from [place]  Her husband was born in [place], [place].  They met in middle school in [place].  She has been married to her husband x 52 years.  The patient is accompanied by her husband, [name], today.  Allergies:  Allergies  Allergen Reactions  . Hydrochlorothiazide     Leg Cramps  . Penicillins Rash    Current Medications: Current Outpatient Medications  Medication Sig Dispense Refill  . amLODipine (NORVASC) 5 MG tablet Take 1 tablet (5 mg total) by mouth daily. 30 tablet 5  . aspirin EC 81 MG tablet Take 1 tablet (81 mg  total) by mouth daily. 90 tablet 3  . cyanocobalamin 500 MCG tablet Take 500 mcg by mouth daily.    . furosemide (LASIX) 40 MG tablet Take 1 tablet (40 mg total) by mouth daily. 90 tablet 3  . lisinopril (PRINIVIL,ZESTRIL) 20 MG tablet Take 1 tablet (20 mg total) daily by mouth. 90 tablet 3  . lovastatin (MEVACOR) 20 MG tablet TAKE 1 TABLET (20 MG TOTAL) BY MOUTH AT BEDTIME. 90 tablet 4  . metFORMIN (GLUCOPHAGE-XR) 750 MG 24 hr tablet TAKE 2 TABLETS (1,500 MG TOTAL) BY MOUTH DAILY. 180 tablet 3  . spironolactone (ALDACTONE) 25 MG tablet TAKE 1 TABLET (25 MG TOTAL) BY MOUTH DAILY. 30 tablet 5  . traZODone (DESYREL) 100 MG tablet TAKE 1 TABLET BY MOUTH EVERY NIGHT AT BEDTIME AS NEEDED FOR  INSOMNIA 90 tablet 4  . fluticasone furoate-vilanterol (BREO ELLIPTA) 200-25 MCG/INH AEPB Inhale 1 puff into the lungs daily. FOR ASTHMA J45.20 (Patient not taking: Reported on 08/28/2017) 1 each 5  . HYDROcodone-acetaminophen (NORCO) 10-325 MG tablet Take 1 tablet by mouth every 8 (eight) hours as needed. (Patient not taking: Reported on 08/28/2017) 90 tablet 0  . meloxicam (MOBIC) 15 MG tablet TAKE 1 TABLET (15 MG TOTAL) BY MOUTH DAILY AS NEEDED FOR PAIN. TAKE WITH FOOD (Patient not taking: Reported on 08/28/2017) 90 tablet 3  . metoprolol tartrate (LOPRESSOR) 50 MG tablet Take 1 tablet (50 mg total) by mouth 2 (two) times daily. 180 tablet 3   No current facility-administered medications for this visit.     Review of Systems  Constitutional: Negative for chills, diaphoresis, fever, malaise/fatigue and weight loss (up 7 pounds).       Feel "just fine".  HENT: Negative.  Negative for congestion, ear discharge, ear pain, nosebleeds, sinus pain, sore throat and tinnitus.   Eyes: Negative.  Negative for blurred vision, double vision, photophobia, discharge and redness.  Respiratory: Negative.  Negative for cough, hemoptysis, sputum production and shortness of breath.   Cardiovascular: Positive for leg swelling (wears compression stockings). Negative for chest pain, palpitations, orthopnea and PND.  Gastrointestinal: Negative.  Negative for abdominal pain, blood in stool, constipation, diarrhea, melena, nausea and vomiting.  Genitourinary: Negative.  Negative for dysuria, frequency, hematuria and urgency.  Musculoskeletal: Positive for back pain (once in awhile) and joint pain (knees; associated with weather). Negative for falls, myalgias and neck pain.  Skin: Negative for itching and rash.  Neurological: Positive for tingling. Negative for dizziness, tremors, speech change, focal weakness, weakness and headaches.       Fingers on left feel tingly.  Psychiatric/Behavioral: Negative for depression and  memory loss. The patient is not nervous/anxious and does not have insomnia.   All other systems reviewed and are negative.  Performance status (ECOG): 1 - Symptomatic but completely ambulatory  Physical Exam: Blood pressure 140/84, pulse 60, temperature (!) 94.5 F (34.7 C), temperature source Tympanic, resp. rate 18, weight 214 lb 11.2 oz (97.4 kg). GENERAL:  Well developed, well nourished, woman sitting comfortably in a wheelchair in the exam room in no acute distress. MENTAL STATUS:  Alert and oriented to person, place and time. HEAD:  Short gray hair.  Normocephalic, atraumatic, face symmetric, no Cushingoid features. EYES:  Glasses.  Blue eyes.  Pupils equal round and reactive to light and accomodation.  No conjunctivitis or scleral icterus. ENT:  Oropharynx clear without lesion.  Tongue normal. Mucous membranes moist.  RESPIRATORY:  Clear to auscultation without rales, wheezes or rhonchi. CARDIOVASCULAR:  Regular rate  and rhythm without murmur, rub or gallop. ABDOMEN:  Soft, non-tender, with active bowel sounds, and no hepatosplenomegaly.  No masses. SKIN:  No rashes, ulcers or lesions. EXTREMITIES: Support stockings.  No skin discoloration or tenderness.  No palpable cords. LYMPH NODES: No palpable cervical, supraclavicular, axillary or inguinal adenopathy  NEUROLOGICAL: Unremarkable. PSYCH:  Appropriate.    Appointment on 08/28/2017  Component Date Value Ref Range Status  . Sodium 08/28/2017 138  135 - 145 mmol/L Final  . Potassium 08/28/2017 4.7  3.5 - 5.1 mmol/L Final  . Chloride 08/28/2017 104  98 - 111 mmol/L Final  . CO2 08/28/2017 26  22 - 32 mmol/L Final  . Glucose, Bld 08/28/2017 115* 70 - 99 mg/dL Final  . BUN 08/28/2017 40* 8 - 23 mg/dL Final  . Creatinine, Ser 08/28/2017 1.36* 0.44 - 1.00 mg/dL Final  . Calcium 08/28/2017 10.0  8.9 - 10.3 mg/dL Final  . Total Protein 08/28/2017 8.8* 6.5 - 8.1 g/dL Final  . Albumin 08/28/2017 3.6  3.5 - 5.0 g/dL Final  . AST  08/28/2017 19  15 - 41 U/L Final  . ALT 08/28/2017 10  0 - 44 U/L Final  . Alkaline Phosphatase 08/28/2017 60  38 - 126 U/L Final  . Total Bilirubin 08/28/2017 0.5  0.3 - 1.2 mg/dL Final  . GFR calc non Af Amer 08/28/2017 38* >60 mL/min Final  . GFR calc Af Amer 08/28/2017 44* >60 mL/min Final   Comment: (NOTE) The eGFR has been calculated using the CKD EPI equation. This calculation has not been validated in all clinical situations. eGFR's persistently <60 mL/min signify possible Chronic Kidney Disease.   Georgiann Hahn gap 08/28/2017 8  5 - 15 Final   Performed at [facility], [address] [place], [place] [address]  . WBC 08/28/2017 5.2  3.6 - 11.0 K/uL Final  . RBC 08/28/2017 3.08* 3.80 - 5.20 MIL/uL Final  . Hemoglobin 08/28/2017 9.3* 12.0 - 16.0 g/dL Final  . HCT 08/28/2017 28.0* 35.0 - 47.0 % Final  . MCV 08/28/2017 90.8  80.0 - 100.0 fL Final  . MCH 08/28/2017 30.2  26.0 - 34.0 pg Final  . MCHC 08/28/2017 33.2  32.0 - 36.0 g/dL Final  . RDW 08/28/2017 14.3  11.5 - 14.5 % Final  . Platelets 08/28/2017 238  150 - 440 K/uL Final  . Neutrophils Relative % 08/28/2017 56  % Final  . Neutro Abs 08/28/2017 2.9  1.4 - 6.5 K/uL Final  . Lymphocytes Relative 08/28/2017 32  % Final  . Lymphs Abs 08/28/2017 1.6  1.0 - 3.6 K/uL Final  . Monocytes Relative 08/28/2017 9  % Final  . Monocytes Absolute 08/28/2017 0.5  0.2 - 0.9 K/uL Final  . Eosinophils Relative 08/28/2017 2  % Final  . Eosinophils Absolute 08/28/2017 0.1  0 - 0.7 K/uL Final  . Basophils Relative 08/28/2017 1  % Final  . Basophils Absolute 08/28/2017 0.0  0 - 0.1 K/uL Final   Performed at [facility], [address] [place], [place] [address]    Assessment:  ELKA SATTERFIELD is a 72 y.o. female with stage II myeloma.  SPEP on 01/25/2016 revealed a 0.9 gm/dL IgG monoclonal gammopathy.  She denied any bone pain or recurrent infections.  Calcium and creatinine were normal.  Urinalysis revealed 30+  protein.  Work-up on 02/14/2016 revealed a hematocrit of 29.1, hemoglobin 9.4, MCV 84.6, platelets 225,000, white count 5700 with an [place] of 3700.  SPEP revealed a  1.9 g/dL monoclonal spike.  Kappa free light chains were 237.2, lambda free light chains 38.7 with a ratio of 6.13 (0.26-1.65).  Beta 2-microglobulin was 3.9 (0.6-2.4).  BMP revealed a creatinine of 0.95 and calcium 9.0.  24 hour UPEP revealed 31 mg/24 hours of an IgG monoclonal protein with kappa light chain specificity.  Bence Jones protein was positive, kappa type.  Bone marrow on 02/29/2016 revealed a hypercellular marrow for age with plasmacytosis (plasma cells 10%).  There was trilineage hematopoiesis. Immunohistochemical stains highlighted the plasma cell component which generally showed a polyclonal staining pattern for kappa and lambda light chain although there was kappa light chain excess especially in several plasma cell clusters. The features were most suggestive of early plasma cell dyscrasia/neoplasm.  Cytogenetics were normal (29, XX).  Bone survey on 03/22/2016 revealed no focal lesion or acute bony abnormality.  PET scan on 03/31/2016 revealed no significant abnormal hypermetabolic activity in the head, neck, chest, abdomen, pelvis, or skeleton.  SPEP has been followed: 0.9 gm/dL on 01/25/2016, 1.9 on 02/14/2016, 1.7 on 03/24/2016, 0.7 on 05/16/2016, 0.8 on 07/14/2016, 1.6 on 09/14/2016, 1.1 on 11/14/2016; 0 on 03/01/2017, 05/29/2017, and 08/28/2017.   Kappa free light chains have ben followed: 237.2 (ratio 6.13) on 02/14/2016, 343.1 (ratio 6.74) on 11/14/2016, 332.3 (ratio 9.31) on 05/29/2017, and 286.7 (ratio 6.18) on 08/28/2017.  She has a normocytic anemia.  Work-up on 01/25/2016 and 02/14/2016 revealed a normal ferritin (90), iron studies, folate, and thyroid function tests.  B12 was 312 (low normal) on 01/25/2016, but with a normal MMA on 02/14/2016,  thus initially ruling out B12 deficiency.  Reticulocyte count is  inappropiately low.  She has B12 deficiency. Antiparietal and intrinsic factor antibodies were normal. She started on oral B12 in early 01/2017.  B12 was 154 on 12/28/2016 and 410 on 03/15/2017.   Last colonoscopy was 2 years ago.  Guaiac cards were negative x 3 in 04/2016.  TSH was normal on 05/29/2017.  Cardiac catheterization on 03/07/2016 revealed non-obstructive coronary artery disease.  She has severe pulmonary artery hypertension with moderate to severely elevated right heart filling pressures and moderately elevated left heart filling pressures.   Symptomatically, she is doing well.  She has had no interval infections.  Lower extremity skin breakdown has resolved.  M-spike is 0.  Plan: 1. Labs today:  CBC with diff, CMP, SPEP, FLCA. 2. Multiple myeloma:  Clinically doing well.  M-spike 0 on last 2 checks.  FLC ratio improving (check every 6 months).  Continue observation. 3. B12 deficiency:  Continue  500 mcg B12 supplement daily.   Recheck level at next visit. 4. RTC in 3 months for MD assessment and labs (CBC with diff, CMP, SPEP, B12).   Honor Loh, NP  08/28/2017, 11:52 AM   I saw and evaluated the patient, participating in the key portions of the service and reviewing pertinent diagnostic studies and records.  I reviewed the nurse practitioner's note and agree with the findings and the plan.  The assessment and plan were discussed with the patient.  Several questions were asked by the patient and answered.   Nolon Stalls, MD 08/28/2017,11:52 AM

## 2017-08-28 NOTE — Progress Notes (Signed)
Here for follow up. Per pt and husband she is doing well- except-pt c/o of urinary frequency w lasix.

## 2017-08-29 LAB — KAPPA/LAMBDA LIGHT CHAINS
Kappa free light chain: 286.7 mg/L — ABNORMAL HIGH (ref 3.3–19.4)
Kappa, lambda light chain ratio: 6.18 — ABNORMAL HIGH (ref 0.26–1.65)
Lambda free light chains: 46.4 mg/L — ABNORMAL HIGH (ref 5.7–26.3)

## 2017-08-29 LAB — PROTEIN ELECTROPHORESIS, SERUM
A/G Ratio: 0.7 (ref 0.7–1.7)
Albumin ELP: 3.3 g/dL (ref 2.9–4.4)
Alpha-1-Globulin: 0.2 g/dL (ref 0.0–0.4)
Alpha-2-Globulin: 0.9 g/dL (ref 0.4–1.0)
Beta Globulin: 1.1 g/dL (ref 0.7–1.3)
Gamma Globulin: 2.7 g/dL — ABNORMAL HIGH (ref 0.4–1.8)
Globulin, Total: 5 g/dL — ABNORMAL HIGH (ref 2.2–3.9)
Total Protein ELP: 8.3 g/dL (ref 6.0–8.5)

## 2017-09-15 ENCOUNTER — Other Ambulatory Visit: Payer: Self-pay | Admitting: Internal Medicine

## 2017-09-24 ENCOUNTER — Ambulatory Visit (INDEPENDENT_AMBULATORY_CARE_PROVIDER_SITE_OTHER): Payer: Medicare Other

## 2017-09-24 ENCOUNTER — Encounter: Payer: Self-pay | Admitting: Family Medicine

## 2017-09-24 ENCOUNTER — Ambulatory Visit (INDEPENDENT_AMBULATORY_CARE_PROVIDER_SITE_OTHER): Payer: Medicare Other | Admitting: Family Medicine

## 2017-09-24 VITALS — BP 134/66 | HR 63 | Temp 97.7°F | Ht 64.0 in | Wt 222.0 lb

## 2017-09-24 DIAGNOSIS — Z0000 Encounter for general adult medical examination without abnormal findings: Secondary | ICD-10-CM | POA: Diagnosis not present

## 2017-09-24 DIAGNOSIS — N390 Urinary tract infection, site not specified: Secondary | ICD-10-CM | POA: Diagnosis not present

## 2017-09-24 DIAGNOSIS — N183 Chronic kidney disease, stage 3 unspecified: Secondary | ICD-10-CM

## 2017-09-24 DIAGNOSIS — Z23 Encounter for immunization: Secondary | ICD-10-CM | POA: Diagnosis not present

## 2017-09-24 DIAGNOSIS — E1121 Type 2 diabetes mellitus with diabetic nephropathy: Secondary | ICD-10-CM

## 2017-09-24 DIAGNOSIS — R319 Hematuria, unspecified: Secondary | ICD-10-CM

## 2017-09-24 DIAGNOSIS — I428 Other cardiomyopathies: Secondary | ICD-10-CM | POA: Diagnosis not present

## 2017-09-24 DIAGNOSIS — R32 Unspecified urinary incontinence: Secondary | ICD-10-CM

## 2017-09-24 DIAGNOSIS — G4700 Insomnia, unspecified: Secondary | ICD-10-CM

## 2017-09-24 LAB — POCT GLYCOSYLATED HEMOGLOBIN (HGB A1C)
ESTIMATED AVERAGE GLUCOSE: 166
HEMOGLOBIN A1C: 7.4 % — AB (ref 4.0–5.6)

## 2017-09-24 LAB — POCT URINALYSIS DIPSTICK
BILIRUBIN UA: NEGATIVE
Glucose, UA: NEGATIVE
Ketones, UA: NEGATIVE
Nitrite, UA: POSITIVE
Protein, UA: POSITIVE — AB
SPEC GRAV UA: 1.01 (ref 1.010–1.025)
Urobilinogen, UA: 0.2 E.U./dL
pH, UA: 7 (ref 5.0–8.0)

## 2017-09-24 LAB — POCT UA - MICROALBUMIN: Microalbumin Ur, POC: 50 mg/L

## 2017-09-24 MED ORDER — NITROFURANTOIN MONOHYD MACRO 100 MG PO CAPS: 100.0000 mg | ORAL_CAPSULE | Freq: Two times a day (BID) | ORAL | 0 refills | Status: AC

## 2017-09-24 NOTE — Progress Notes (Signed)
Subjective:   [name] is a 72 y.o. female who presents for Medicare Annual (Subsequent) preventive examination.  Review of Systems:  N/A  Cardiac Risk Factors include: advanced age (>13men, >46 women);diabetes mellitus;dyslipidemia;hypertension;obesity (BMI >30kg/m2)     Objective:     Vitals: BP 134/66 (BP Location: Right Arm)   Pulse 63   Temp 97.7 F (36.5 C) (Oral)   Ht 5\' 4"  (1.626 m)   Wt 222 lb (100.7 kg)   BMI 38.11 kg/m   Body mass index is 38.11 kg/m.  Advanced Directives 09/24/2017 08/28/2017 05/29/2017 12/28/2016 11/14/2016 09/21/2016 07/14/2016  Does Patient Have a Medical Advance Directive? Yes No No No No No No  Type of Paramedic of [place];Living will - - - - - -  Copy of [place] in Chart? No - copy requested - - - - - -  Would patient like information on creating a medical advance directive? - No - Patient declined - Yes (MAU/Ambulatory/Procedural Areas - Information given) - Yes (ED - Information included in AVS) -    Tobacco Social History   Tobacco Use  Smoking Status Never Smoker  Smokeless Tobacco Never Used     Counseling given: Not Answered   Clinical Intake:  Pre-visit preparation completed: Yes  Pain : No/denies pain Pain Score: 0-No pain     Nutritional Status: BMI > 30  Obese Nutritional Risks: None Diabetes: No  How often do you need to have someone help you when you read instructions, pamphlets, or other written materials from your doctor or pharmacy?: 1 - Never  Interpreter Needed?: No  Information entered by :: [employer], LPN  Past Medical History:  Diagnosis Date  . Aortic stenosis   . Asthma   . Coronary artery disease   . Diabetes mellitus without complication ([place])   . History of measles   . Hypertension   . Nonischemic cardiomyopathy ([place])   . Pulmonary hypertension ([place])    Past Surgical History:  Procedure Laterality Date  . Cyst(solitary) of breast:removed     . RIGHT/LEFT HEART CATH AND CORONARY ANGIOGRAPHY N/A 03/07/2016   Procedure: Right/Left Heart Cath and Coronary Angiography;  Surgeon: Nelva Bush, MD;  Location: [facility];  Service: Cardiovascular;  Laterality: N/A;  . TUBAL LIGATION     Family History  Problem Relation Age of Onset  . Cancer Mother        Uterine cancer  . Diabetes Mother   . Heart disease Mother   . Heart failure Mother   . Parkinson's disease Father   . Cancer Sister        breast cancer  . Diabetes Brother        Non-insulin Dependent Diabetes Mellitus  . Mesothelioma Brother   . Hypertension Sister   . Diabetes Sister        Non-insulin dependent Diabetes Mellitus   Social History   Socioeconomic History  . Marital status: Married    Spouse name: Not on file  . Number of children: 2  . Years of education: Not on file  . Highest education level: High school graduate  Occupational History  . Occupation: retired  Scientific laboratory technician  . Financial resource strain: Not hard at all  . Food insecurity:    Worry: Never true    Inability: Never true  . Transportation needs:    Medical: No    Non-medical: No  Tobacco Use  . Smoking status: Never Smoker  .  Smokeless tobacco: Never Used  Substance and Sexual Activity  . Alcohol use: No  . Drug use: No  . Sexual activity: Not on file  Lifestyle  . Physical activity:    Days per week: Not on file    Minutes per session: Not on file  . Stress: Only a little  Relationships  . Social connections:    Talks on phone: Not on file    Gets together: Not on file    Attends religious service: Not on file    Active member of club or organization: Not on file    Attends meetings of clubs or organizations: Not on file    Relationship status: Not on file  Other Topics Concern  . Not on file  Social History Narrative  . Not on file    Outpatient Encounter Medications as of 09/24/2017  Medication Sig  . amLODipine (NORVASC) 5 MG tablet TAKE 1  TABLET BY MOUTH EVERY DAY  . aspirin EC 81 MG tablet Take 1 tablet (81 mg total) by mouth daily.  . cyanocobalamin 500 MCG tablet Take 500 mcg by mouth daily.  . fluticasone furoate-vilanterol (BREO ELLIPTA) 200-25 MCG/INH AEPB Inhale 1 puff into the lungs daily. FOR ASTHMA J45.20  . furosemide (LASIX) 40 MG tablet Take 1 tablet (40 mg total) by mouth daily.  Marland Kitchen HYDROcodone-acetaminophen (NORCO) 10-325 MG tablet Take 1 tablet by mouth every 8 (eight) hours as needed.  Marland Kitchen lisinopril (PRINIVIL,ZESTRIL) 20 MG tablet Take 1 tablet (20 mg total) daily by mouth.  . lovastatin (MEVACOR) 20 MG tablet TAKE 1 TABLET (20 MG TOTAL) BY MOUTH AT BEDTIME.  . metFORMIN (GLUCOPHAGE-XR) 750 MG 24 hr tablet TAKE 2 TABLETS (1,500 MG TOTAL) BY MOUTH DAILY.  . metoprolol tartrate (LOPRESSOR) 50 MG tablet Take 1 tablet (50 mg total) by mouth 2 (two) times daily.  Marland Kitchen spironolactone (ALDACTONE) 25 MG tablet TAKE 1 TABLET (25 MG TOTAL) BY MOUTH DAILY.  . traZODone (DESYREL) 100 MG tablet TAKE 1 TABLET BY MOUTH EVERY NIGHT AT BEDTIME AS NEEDED FOR INSOMNIA  . meloxicam (MOBIC) 15 MG tablet TAKE 1 TABLET (15 MG TOTAL) BY MOUTH DAILY AS NEEDED FOR PAIN. TAKE WITH FOOD (Patient not taking: Reported on 08/28/2017)   No facility-administered encounter medications on file as of 09/24/2017.     Activities of Daily Living In your present state of health, do you have any difficulty performing the following activities: 09/24/2017  Hearing? N  Vision? N  Difficulty concentrating or making decisions? N  Walking or climbing stairs? Y  Comment Due to knee pains.   Dressing or bathing? N  Doing errands, shopping? Y  Comment Chooses not to drive.   Preparing Food and eating ? N  Using the Toilet? N  In the past six months, have you accidently leaked urine? Y  Comment Wears pads at all times.  Do you have problems with loss of bowel control? N  Managing your Medications? Y  Comment Husband manages meds.   Managing your Finances? Y    Housekeeping or managing your Housekeeping? N  Some recent data might be hidden    Patient Care Team: Birdie Sons, MD as PCP - General (Family Medicine) End, Harrell Gave, MD as Consulting Physician (Cardiology) Eulogio Bear, MD as Consulting Physician (Ophthalmology) Lequita Asal, MD as Referring Physician (Hematology and Oncology)    Assessment:   This is a routine wellness examination for Carlene.  Exercise Activities and Dietary recommendations Current Exercise Habits: The patient  does not participate in regular exercise at present, Exercise limited by: orthopedic condition(s)  Goals    . DIET - REDUCE SUGAR INTAKE     Recommend to cut out junk foods and sweets in daily diet to help aid in weight loss.     . Fall Prevention     Recommend removing all area rugs from the home to prevent falls.        Fall Risk Fall Risk  09/24/2017 09/21/2016 09/08/2015 07/01/2014  Falls in the past year? No Yes Yes Yes  Number falls in past yr: - 2 or more 2 or more 1  Injury with Fall? - No No No  Follow up - Falls prevention discussed - Falls evaluation completed   Is the patient's home free of loose throw rugs in walkways, pet beds, electrical cords, etc?   yes      Grab bars in the bathroom? yes      Handrails on the stairs?   yes      Adequate lighting?   yes  Timed Get Up and Go performed: N/A  Depression Screen PHQ 2/9 Scores 09/24/2017 09/21/2016 09/08/2015 07/01/2014  PHQ - 2 Score 0 2 0 2  PHQ- 9 Score - 7 - 3     Cognitive Function: Pt declined screening today.         Immunization History  Administered Date(s) Administered  . Influenza, High Dose Seasonal PF 12/02/2014, 09/08/2015, 09/21/2016, 09/24/2017  . Pneumococcal Conjugate-13 05/09/2013  . Pneumococcal Polysaccharide-23 01/19/2011    Qualifies for Shingles Vaccine? Due for Shingles vaccine. Declined my offer to administer today. Education has been provided regarding the importance of this vaccine.  Pt has been advised to call her insurance company to determine her out of pocket expense. Advised she may also receive this vaccine at her local pharmacy or Health Dept. Verbalized acceptance and understanding.  Screening Tests Health Maintenance  Topic Date Due  . FOOT EXAM  05/12/1955  . TETANUS/TDAP  09/03/2026 (Originally 05/12/1964)  . HEMOGLOBIN A1C  12/21/2017  . OPHTHALMOLOGY EXAM  01/03/2018  . MAMMOGRAM  05/18/2018  . COLONOSCOPY  07/10/2022  . INFLUENZA VACCINE  Completed  . DEXA SCAN  Completed  . Hepatitis C Screening  Completed  . PNA vac Low Risk Adult  Completed    Cancer Screenings: Lung: Low Dose CT Chest recommended if Age 62-80 years, 30 pack-year currently smoking OR have quit w/in 15years. Patient does not qualify. Breast:  Up to date on Mammogram? Yes   Up to date of Bone Density/Dexa? Yes Colorectal: Up to date  Additional Screenings:  Hepatitis C Screening:      Plan:  I have personally reviewed and addressed the Medicare Annual Wellness questionnaire and have noted the following in the patient's chart:  A. Medical and social history B. Use of alcohol, tobacco or illicit drugs  C. Current medications and supplements D. Functional ability and status E.  Nutritional status F.  Physical activity G. Advance directives H. List of other physicians I.  Hospitalizations, surgeries, and ER visits in previous 12 months J.  [place] such as hearing and vision if needed, cognitive and depression L. Referrals and appointments - none  In addition, I have reviewed and discussed with patient certain preventive protocols, quality metrics, and best practice recommendations. A written personalized care plan for preventive services as well as general preventive health recommendations were provided to patient.  See attached scanned questionnaire for additional information.   Signed,  Fabio Neighbors, LPN Nurse Health Advisor   Nurse Recommendations:  Pt needs a diabetic foot exam today. Pt declined the tetanus vaccine today.

## 2017-09-24 NOTE — Patient Instructions (Addendum)
.   Please call the [facility] [phone]) to schedule a routine screening mammogram.   Call for referral to urologist if having trouble controlling bladder after you finish the antibiotic.

## 2017-09-24 NOTE — Progress Notes (Signed)
Patient: [name], Female    DOB: [birth year], 72 y.o.   MRN: [id number] Visit Date: 09/24/2017  Today's Provider: Lelon Huh, MD   Chief Complaint  Patient presents with  . Annual Exam  . Diabetes  . Hypertension   Subjective:   Patient saw [facility]enzie for AWV today at 9:30 am.    Complete Physical [name] is a 72 y.o. female. She feels fairly well. She reports exercising twice a week walking at flea markets. She reports she is sleeping fairly well. Overall feeling a little better. Occasionally short of breath usually with exertion. Sees cardiology later this week. Is ambulating with rolator. Sores on legs have completely healed. Takes occasional hydrocodone/apap for arthritic pains. Does not sleep through night and is sleepy during the day.   -----------------------------------------------------------  Diabetes Mellitus Type II, Follow-up:   Lab Results  Component Value Date   HGBA1C 7.0 (A) 06/21/2017   HGBA1C 6.8 03/08/2017   HGBA1C 6.9 (H) 12/21/2016   Last seen for diabetes 3 months ago.  Management since then includes; no changes. She reports good compliance with treatment. She is not having side effects.  Current symptoms include none and have been stable. Home blood sugar records: blood sugars are not checked  Episodes of hypoglycemia? no   Current Insulin Regimen: none Most Recent Eye Exam: <1 year ago Weight trend: fluctuating a bit Prior visit with dietician: no Current diet: in general, an "unhealthy" diet Current exercise: walking  ------------------------------------------------------------------------   Hypertension, follow-up:  BP Readings from Last 3 Encounters:  09/24/17 134/66  08/28/17 140/84  06/21/17 104/64    She was last seen for hypertension 3 months ago.  BP at that visit was 104/64. Management since that visit includes; no changes.She reports good compliance with treatment. She is not having side effects.  She  is exercising. She is adherent to low salt diet.   Outside blood pressures are not being checked. She is experiencing none.  Patient denies chest pain, chest pressure/discomfort, claudication, dyspnea, exertional chest pressure/discomfort, fatigue, irregular heart beat, lower extremity edema, near-syncope, orthopnea, palpitations, paroxysmal nocturnal dyspnea, syncope and tachypnea.   Cardiovascular risk factors include diabetes mellitus and hypertension.  Use of agents associated with hypertension: NSAIDS.   ------------------------------------------------------------------------  Chronic congestive heart failure, unspecified heart failure type ([place]) From 06/21/2017-Strongly encouraged to increase walking as much as tolerated.   Wt Readings from Last 5 Encounters:  09/24/17 222 lb (100.7 kg)  08/28/17 214 lb 11.2 oz (97.4 kg)  05/29/17 207 lb 14.4 oz (94.3 kg)  03/14/17 205 lb (93 kg)  03/08/17 210 lb (95.3 kg)    Her primary complaint is that she she has been unable to control her bladder since being on furosemide. We tried reducing dose several months ago, but she had increase leg swelling. She has had UTIs in the past which may have aggravated symptoms. She states she has to urinate frequently and every time she stands up she starts leaking and has to wear pads all day.   Review of Systems  Constitutional: Negative for chills, fatigue and fever.  HENT: Negative for congestion, ear pain, rhinorrhea, sneezing and sore throat.   Eyes: Negative.  Negative for pain and redness.  Respiratory: Negative for cough, shortness of breath and wheezing.   Cardiovascular: Negative for chest pain and leg swelling.  Gastrointestinal: Negative for abdominal pain, blood in stool, constipation, diarrhea and nausea.  Endocrine: Negative for polydipsia and polyphagia.  Genitourinary: Negative.  Negative for dysuria, flank pain, hematuria, pelvic pain, vaginal bleeding and vaginal discharge.    Musculoskeletal: Positive for arthralgias. Negative for back pain, gait problem and joint swelling.  Skin: Negative for rash.  Neurological: Negative.  Negative for dizziness, tremors, seizures, weakness, light-headedness, numbness and headaches.  Hematological: Negative for adenopathy.  Psychiatric/Behavioral: Negative.  Negative for behavioral problems, confusion and dysphoric mood. The patient is not nervous/anxious and is not hyperactive.     Social History   Socioeconomic History  . Marital status: Married    Spouse name: Not on file  . Number of children: 2  . Years of education: Not on file  . Highest education level: High school graduate  Occupational History  . Occupation: retired  Scientific laboratory technician  . Financial resource strain: Not hard at all  . Food insecurity:    Worry: Never true    Inability: Never true  . Transportation needs:    Medical: No    Non-medical: No  Tobacco Use  . Smoking status: Never Smoker  . Smokeless tobacco: Never Used  Substance and Sexual Activity  . Alcohol use: No  . Drug use: No  . Sexual activity: Not on file  Lifestyle  . Physical activity:    Days per week: Not on file    Minutes per session: Not on file  . Stress: Only a little  Relationships  . Social connections:    Talks on phone: Not on file    Gets together: Not on file    Attends religious service: Not on file    Active member of club or organization: Not on file    Attends meetings of clubs or organizations: Not on file    Relationship status: Not on file  . Intimate partner violence:    Fear of current or ex partner: Not on file    Emotionally abused: Not on file    Physically abused: Not on file    Forced sexual activity: Not on file  Other Topics Concern  . Not on file  Social History Narrative  . Not on file    Past Medical History:  Diagnosis Date  . Aortic stenosis   . Asthma   . Coronary artery disease   . Diabetes mellitus without complication ([place])    . History of measles   . Hypertension   . Nonischemic cardiomyopathy ([place])   . Pulmonary hypertension [facility])      Patient Active Problem List   Diagnosis Date Noted  . Chronic kidney disease, stage 3 ([place]) 03/15/2017  . B12 deficiency 03/01/2017  . Lymphedema 02/14/2017  . Chronic venous insufficiency 02/14/2017  . UTI (urinary tract infection) 09/21/2016  . Nonischemic cardiomyopathy ([place]) 06/14/2016  . Coronary artery disease involving native coronary artery of native heart without angina pectoris 06/14/2016  . Bilateral lower extremity edema 06/14/2016  . Multiple open wounds of lower leg 06/14/2016  . Multiple myeloma not having achieved remission ([place]) 03/26/2016  . Back pain 03/26/2016  . Goals of care, counseling/discussion 03/24/2016  . Diverticulosis 03/20/2016  . Atherosclerosis of aorta ([place]) 03/20/2016  . Cellulitis of right lower extremity 03/18/2016  . Congestive heart failure ([place]) 03/07/2016  . Monoclonal gammopathy 01/25/2016  . Anemia 01/25/2016  . LVH (left ventricular hypertrophy) 08/24/2015  . Mitral valve insufficiency and aortic valve stenosis 07/14/2015  . Urinary incontinence 03/02/2015  . Diabetic retinopathy with macular edema ([place]) 02/19/2015  . Constipation 12/02/2014  . Osteoarthritis 07/31/2014  . Insomnia 06/30/2014  . Bleeding internal  hemorrhoids 06/30/2014  . Osteopenia 06/30/2014  . Infected sebaceous cyst 06/30/2014  . Fecal occult blood test positive 06/30/2014  . Vitamin D deficiency 04/29/2009  . Diabetes mellitus with nephropathy ([place]) 01/28/2009  . Eczema intertrigo 01/03/2008  . HLD (hyperlipidemia) 04/02/2006  . Edema 08/11/2003  . BMI 40.0-44.9, adult ([place]) 09/22/2002  . Intermittent asthma, uncontrolled 08/31/2000  . Essential hypertension 08/31/2000  . Leg varices 04/18/1999    Past Surgical History:  Procedure Laterality Date  . Cyst(solitary) of breast:removed    . RIGHT/LEFT HEART CATH AND CORONARY ANGIOGRAPHY N/A  03/07/2016   Procedure: Right/Left Heart Cath and Coronary Angiography;  Surgeon: Nelva Bush, MD;  Location: [facility];  Service: Cardiovascular;  Laterality: N/A;  . TUBAL LIGATION      Her family history includes Cancer in her mother and sister; Diabetes in her brother, mother, and sister; Heart disease in her mother; Heart failure in her mother; Hypertension in her sister; Mesothelioma in her brother; Parkinson's disease in her father.      Current Outpatient Medications:  .  amLODipine (NORVASC) 5 MG tablet, TAKE 1 TABLET BY MOUTH EVERY DAY, Disp: 90 tablet, Rfl: 0 .  aspirin EC 81 MG tablet, Take 1 tablet (81 mg total) by mouth daily., Disp: 90 tablet, Rfl: 3 .  cyanocobalamin 500 MCG tablet, Take 500 mcg by mouth daily., Disp: , Rfl:  .  fluticasone furoate-vilanterol (BREO ELLIPTA) 200-25 MCG/INH AEPB, Inhale 1 puff into the lungs daily. FOR ASTHMA J45.20, Disp: 1 each, Rfl: 5 .  furosemide (LASIX) 40 MG tablet, Take 1 tablet (40 mg total) by mouth daily., Disp: 90 tablet, Rfl: 3 .  HYDROcodone-acetaminophen (NORCO) 10-325 MG tablet, Take 1 tablet by mouth every 8 (eight) hours as needed., Disp: 90 tablet, Rfl: 0 .  lisinopril (PRINIVIL,ZESTRIL) 20 MG tablet, Take 1 tablet (20 mg total) daily by mouth., Disp: 90 tablet, Rfl: 3 .  lovastatin (MEVACOR) 20 MG tablet, TAKE 1 TABLET (20 MG TOTAL) BY MOUTH AT BEDTIME., Disp: 90 tablet, Rfl: 4 .  meloxicam (MOBIC) 15 MG tablet, TAKE 1 TABLET (15 MG TOTAL) BY MOUTH DAILY AS NEEDED FOR PAIN. TAKE WITH FOOD (Patient not taking: Reported on 08/28/2017), Disp: 90 tablet, Rfl: 3 .  metFORMIN (GLUCOPHAGE-XR) 750 MG 24 hr tablet, TAKE 2 TABLETS (1,500 MG TOTAL) BY MOUTH DAILY., Disp: 180 tablet, Rfl: 3 .  metoprolol tartrate (LOPRESSOR) 50 MG tablet, Take 1 tablet (50 mg total) by mouth 2 (two) times daily., Disp: 180 tablet, Rfl: 3 .  spironolactone (ALDACTONE) 25 MG tablet, TAKE 1 TABLET (25 MG TOTAL) BY MOUTH DAILY., Disp: 30 tablet, Rfl:  5 .  traZODone (DESYREL) 100 MG tablet, TAKE 1 TABLET BY MOUTH EVERY NIGHT AT BEDTIME AS NEEDED FOR INSOMNIA, Disp: 90 tablet, Rfl: 4  Patient Care Team: Birdie Sons, MD as PCP - General (Family Medicine) End, Harrell Gave, MD as Consulting Physician (Cardiology) Eulogio Bear, MD as Consulting Physician (Ophthalmology) Lequita Asal, MD as Referring Physician (Hematology and Oncology)     Objective:   Vitals:    BP 134/66 (BP Location: Right Arm)   Pulse 63   Temp 97.7 F (36.5 C) (Oral)   Ht _0  (1.626 m)   Wt 222 lb (100.7 kg)   BMI 38.11 kg/m   BSA 2.13 m   Pain Webb 0-No pain   Physical Exam   General Appearance:    Alert, cooperative, no distress, appears stated age  Head:  Normocephalic, without obvious abnormality, atraumatic  Eyes:    PERRL, conjunctiva/corneas clear, EOM's intact, fundi    benign, both eyes  Ears:    Normal TM's and external ear canals, both ears  Nose:   Nares normal, septum midline, mucosa normal, no drainage    or sinus tenderness  Throat:   Lips, mucosa, and tongue normal; teeth and gums normal  Neck:   Supple, symmetrical, trachea midline, no adenopathy;    thyroid:  no enlargement/tenderness/nodules; no carotid   bruit or JVD  Back:     Symmetric, no curvature, ROM normal, no CVA tenderness  Lungs:     Clear to auscultation bilaterally, respirations unlabored  Chest Wall:    No tenderness or deformity   Heart:    Regular rate and rhythm, S1 and S2 normal, no murmur, rub   or gallop  Breast Exam:    deferred  Abdomen:     Soft, non-tender, bowel sounds active all four quadrants,    no masses, no organomegaly  Pelvic:    deferred  Extremities:   Extremities normal, atraumatic, no cyanosis or edema  Pulses:   2+ and symmetric all extremities  Skin:   Skin color, texture, turgor normal, no rashes or lesions  Lymph nodes:   Cervical, supraclavicular, and axillary nodes normal  Neurologic:   CNII-XII intact, normal  strength, sensation and reflexes    throughout    Results for orders placed or performed in visit on 09/24/17  POCT HgB A1C  Result Value Ref Range   Hemoglobin A1C 7.4 (A) 4.0 - 5.6 %   HbA1c POC (<> result, manual entry)     HbA1c, POC (prediabetic range)     HbA1c, POC (controlled diabetic range)     Est. average glucose Bld gHb Est-mCnc 166   POCT Urinalysis Dipstick  Result Value Ref Range   Color, UA amber    Clarity, UA cloudy    Glucose, UA Negative Negative   Bilirubin, UA negative    Ketones, UA negative    Spec Grav, UA 1.010 1.010 - 1.025   Blood, UA Moderate (hemolyzed)    pH, UA 7.0 5.0 - 8.0   Protein, UA Positive (A) Negative   Urobilinogen, UA 0.2 0.2 or 1.0 E.U./dL   Nitrite, UA positive    Leukocytes, UA Large (3+) (A) Negative   Appearance     Odor    POCT UA - Microalbumin  Result Value Ref Range   Microalbumin Ur, POC 50 mg/L   Creatinine, POC     Albumin/Creatinine Ratio, Urine, POC       Activities of Daily Living In your present state of health, do you have any difficulty performing the following activities: 09/24/2017  Hearing? N  Vision? N  Difficulty concentrating or making decisions? N  Walking or climbing stairs? Y  Comment Due to knee pains.   Dressing or bathing? N  Doing errands, shopping? Y  Comment Chooses not to drive.   Preparing Food and eating ? N  Using the Toilet? N  In the past six months, have you accidently leaked urine? Y  Comment Wears pads at all times.  Do you have problems with loss of bowel control? N  Managing your Medications? Y  Comment Husband manages meds.   Managing your Finances? Y  Housekeeping or managing your Housekeeping? N  Some recent data might be hidden    Fall Risk Assessment Fall Risk  09/24/2017 09/21/2016 09/08/2015 07/01/2014  Falls in the  past year? No Yes Yes Yes  Number falls in past yr: - 2 or more 2 or more 1  Injury with Fall? - No No No  Follow up - Falls prevention discussed - Falls  evaluation completed     Depression Screen PHQ 2/9 Scores 09/24/2017 09/21/2016 09/08/2015 07/01/2014  PHQ - 2 Score 0 2 0 2  PHQ- 9 Score - 7 - 3      Assessment & Plan:    Annual Physical Reviewed patient's Family Medical History Reviewed and updated list of patient's medical providers Assessment of cognitive impairment was done Assessed patient's functional ability Established a written schedule for health screening [place] Completed and Reviewed  Exercise Activities and Dietary recommendations Goals    . DIET - REDUCE SUGAR INTAKE     Recommend to cut out junk foods and sweets in daily diet to help aid in weight loss.     . Fall Prevention     Recommend removing all area rugs from the home to prevent falls.        Immunization History  Administered Date(s) Administered  . Influenza, High Dose Seasonal PF 12/02/2014, 09/08/2015, 09/21/2016, 09/24/2017  . Pneumococcal Conjugate-13 05/09/2013  . Pneumococcal Polysaccharide-23 01/19/2011    Health Maintenance  Topic Date Due  . FOOT EXAM  05/12/1954  . TETANUS/TDAP  09/03/2026 (Originally 05/12/1963)  . HEMOGLOBIN A1C  12/21/2017  . OPHTHALMOLOGY EXAM  01/03/2018  . MAMMOGRAM  05/18/2018  . COLONOSCOPY  07/10/2022  . INFLUENZA VACCINE  Completed  . DEXA SCAN  Completed  . Hepatitis C Screening  Completed  . PNA vac Low Risk Adult  Completed     Discussed health benefits of physical activity, and encouraged her to engage in regular exercise appropriate for her age and condition.    ------------------------------------------------------------------------------------------------------------  1. Annual physical exam Overall stable. She has cardiology follow up later this week. No additional labs needed today.  2. Diabetes mellitus with nephropathy ([place]) Fairly well controlled. Continue current medications.   - POCT HgB A1C - POCT UA - Microalbumin  3. Nonischemic cardiomyopathy  ([facility]) Asymptomatic. Compliant with medication.  Continue aggressive risk factor modification.    4. Chronic kidney disease, stage 3 ([place])   5. Urinary tract infection with hematuria, site unspecified  - POCT Urinalysis Dipstick - Urine Culture  6. Urinary incontinence, unspecified type Multifactorial, aggravated by furosemide, but u/a indicates infection today. She may have some underlying stress incontinent. Encouraged to make regular frequent trips to restroom to try to prevent overflow. Considering this is the primary factor impairing her QOL, will consider urology referral if not significantly improved after treating UTI.   7. Insomnia. Is sleepy during the day. May be related to nocturia. Consider sleep study if not improving after treating UTI.    Lelon Huh, MD  [facility]

## 2017-09-24 NOTE — Patient Instructions (Addendum)
[name] , Thank you for taking time to come for your Medicare Wellness Visit. I appreciate your ongoing commitment to your health goals. Please review the following plan we discussed and let me know if I can assist you in the future.   Screening recommendations/referrals: Colonoscopy: Up to date Mammogram: Up to date Bone Density: Up to date Recommended yearly ophthalmology/optometry visit for glaucoma screening and checkup Recommended yearly dental visit for hygiene and checkup  Vaccinations: Influenza vaccine: Up to date Pneumococcal vaccine: Up to date Tdap vaccine: Pt declines today.  Shingles vaccine: Pt declines today.     Advanced directives: Please bring a copy of your POA (Power of Attorney) and/or Living Will to your next appointment.   Conditions/risks identified: Obesity- recommend to cut out junk foods and sweets in daily diet to help aid in weight loss.   Next appointment: 10:40 AM today with Dr Caryn Section.    Preventive Care 2 Years and Older, Female Preventive care refers to lifestyle choices and visits with your health care provider that can promote health and wellness. What does preventive care include?  A yearly physical exam. This is also called an annual well check.  Dental exams once or twice a year.  Routine eye exams. Ask your health care provider how often you should have your eyes checked.  Personal lifestyle choices, including:  Daily care of your teeth and gums.  Regular physical activity.  Eating a healthy diet.  Avoiding tobacco and drug use.  Limiting alcohol use.  Practicing safe sex.  Taking low-dose aspirin every day.  Taking vitamin and mineral supplements as recommended by your health care provider. What happens during an annual well check? The services and screenings done by your health care provider during your annual well check will depend on your age, overall health, lifestyle risk factors, and family history of  disease. Counseling  Your health care provider may ask you questions about your:  Alcohol use.  Tobacco use.  Drug use.  Emotional well-being.  Home and relationship well-being.  Sexual activity.  Eating habits.  History of falls.  Memory and ability to understand (cognition).  Work and work Statistician.  Reproductive health. Screening  You may have the following tests or measurements:  Height, weight, and BMI.  Blood pressure.  Lipid and cholesterol levels. These may be checked every 5 years, or more frequently if you are over 41 years old.  Skin check.  Lung cancer screening. You may have this screening every year starting at age 83 if you have a 30-pack-year history of smoking and currently smoke or have quit within the past 15 years.  Fecal occult blood test (FOBT) of the stool. You may have this test every year starting at age 50.  Flexible sigmoidoscopy or colonoscopy. You may have a sigmoidoscopy every 5 years or a colonoscopy every 10 years starting at age 66.  Hepatitis C blood test.  Hepatitis B blood test.  Sexually transmitted disease (STD) testing.  Diabetes screening. This is done by checking your blood sugar (glucose) after you have not eaten for a while (fasting). You may have this done every 1-3 years.  Bone density scan. This is done to screen for osteoporosis. You may have this done starting at age 77.  Mammogram. This may be done every 1-2 years. Talk to your health care provider about how often you should have regular mammograms. Talk with your health care provider about your test results, treatment options, and if necessary, the need for  more tests. Vaccines  Your health care provider may recommend certain vaccines, such as:  Influenza vaccine. This is recommended every year.  Tetanus, diphtheria, and acellular pertussis (Tdap, Td) vaccine. You may need a Td booster every 10 years.  Zoster vaccine. You may need this after age  77.  Pneumococcal 13-valent conjugate (PCV13) vaccine. One dose is recommended after age 60.  Pneumococcal polysaccharide (PPSV23) vaccine. One dose is recommended after age 45. Talk to your health care provider about which screenings and vaccines you need and how often you need them. This information is not intended to replace advice given to you by your health care provider. Make sure you discuss any questions you have with your health care provider. Document Released: 01/15/2015 Document Revised: 09/08/2015 Document Reviewed: 10/20/2014 Elsevier Interactive Patient Education  2017 [employer] Prevention in the Home Falls can cause injuries. They can happen to people of all ages. There are many things you can do to make your home safe and to help prevent falls. What can I do on the outside of my home?  Regularly fix the edges of walkways and driveways and fix any cracks.  Remove anything that might make you trip as you walk through a door, such as a raised step or threshold.  Trim any bushes or trees on the path to your home.  Use bright outdoor lighting.  Clear any walking paths of anything that might make someone trip, such as rocks or tools.  Regularly check to see if handrails are loose or broken. Make sure that both sides of any steps have handrails.  Any raised decks and porches should have guardrails on the edges.  Have any leaves, snow, or ice cleared regularly.  Use sand or salt on walking paths during winter.  Clean up any spills in your garage right away. This includes oil or grease spills. What can I do in the bathroom?  Use night lights.  Install grab bars by the toilet and in the tub and shower. Do not use towel bars as grab bars.  Use non-skid mats or decals in the tub or shower.  If you need to sit down in the shower, use a plastic, non-slip stool.  Keep the floor dry. Clean up any water that spills on the floor as soon as it happens.  Remove  soap buildup in the tub or shower regularly.  Attach bath mats securely with double-sided non-slip rug tape.  Do not have throw rugs and other things on the floor that can make you trip. What can I do in the bedroom?  Use night lights.  Make sure that you have a light by your bed that is easy to reach.  Do not use any sheets or blankets that are too big for your bed. They should not hang down onto the floor.  Have a firm chair that has side arms. You can use this for support while you get dressed.  Do not have throw rugs and other things on the floor that can make you trip. What can I do in the kitchen?  Clean up any spills right away.  Avoid walking on wet floors.  Keep items that you use a lot in easy-to-reach places.  If you need to reach something above you, use a strong step stool that has a grab bar.  Keep electrical cords out of the way.  Do not use floor polish or wax that makes floors slippery. If you must use wax, use non-skid  floor wax.  Do not have throw rugs and other things on the floor that can make you trip. What can I do with my stairs?  Do not leave any items on the stairs.  Make sure that there are handrails on both sides of the stairs and use them. Fix handrails that are broken or loose. Make sure that handrails are as long as the stairways.  Check any carpeting to make sure that it is firmly attached to the stairs. Fix any carpet that is loose or worn.  Avoid having throw rugs at the top or bottom of the stairs. If you do have throw rugs, attach them to the floor with carpet tape.  Make sure that you have a light switch at the top of the stairs and the bottom of the stairs. If you do not have them, ask someone to add them for you. What else can I do to help prevent falls?  Wear shoes that:  Do not have high heels.  Have rubber bottoms.  Are comfortable and fit you well.  Are closed at the toe. Do not wear sandals.  If you use a  stepladder:  Make sure that it is fully opened. Do not climb a closed stepladder.  Make sure that both sides of the stepladder are locked into place.  Ask someone to hold it for you, if possible.  Clearly mark and make sure that you can see:  Any grab bars or handrails.  First and last steps.  Where the edge of each step is.  Use tools that help you move around (mobility aids) if they are needed. These include:  Canes.  Walkers.  Scooters.  Crutches.  Turn on the lights when you go into a dark area. Replace any light bulbs as soon as they burn out.  Set up your furniture so you have a clear path. Avoid moving your furniture around.  If any of your floors are uneven, fix them.  If there are any pets around you, be aware of where they are.  Review your medicines with your doctor. Some medicines can make you feel dizzy. This can increase your chance of falling. Ask your doctor what other things that you can do to help prevent falls. This information is not intended to replace advice given to you by your health care provider. Make sure you discuss any questions you have with your health care provider. Document Released: 10/15/2008 Document Revised: 05/27/2015 Document Reviewed: 01/23/2014 Elsevier Interactive Patient Education  2017 [employer].

## 2017-09-25 NOTE — Progress Notes (Signed)
Follow-up Outpatient Visit Date: 09/26/2017  Primary Care Provider: Birdie Sons, MD [address] [place] [address]  Chief Complaint: Urinary frequency  HPI:  [name] is a 72 y.o. year-old female with history of non-obstructive CAD, NICM with chronic systolic and diastolic heart failure, HTN, DM, and multiple myeloma, who presents for follow-up of coronary artery disease, heart failure, and aortic stenosis.  I last saw [name] in March, at which time she was doing well with improved control of her leg edema.  Will previously noted leg wounds had completely healed.  She was still using compression wraps to help minimize her swelling.  We did not make any medication changes at that time.  Today, [name] reports feeling well except for urinary frequency and incontinence.  She attributes this to furosemide, though she has these symptoms throughout the day and night (even many hours after taking furosemide).  She denies shortness of breath, chest pain, palpitations, and lightheadedness.  Edema has been well-controlled with furosemide and compression stockings.  Leg wounds have not recurred.  [name] was diagnosed with a UTI yesterday and has just started nitrofurantoin.  She feels like her urinary frequency has already started to improve.  --------------------------------------------------------------------------------------------------  Cardiovascular History & Procedures: Cardiovascular Problems:  Chronic systolic and diastolic heart failure secondary to non-ischemic cardiomyopathy  Nonobstructive coronary artery disease  Moderate aortic stenosis  Risk Factors:  Known coronary artery disease, hypertension, hyperlipidemia, diabetes mellitus, sedentary lifestyle, obesity, and age > 21  Cath/PCI:  LHC/RHC (03/07/16): LMCA normal. LAD with 30% ostial stenosi, 30$ midvessel narrowing, and 50% distal disease. LCx with 40% midvessel lesion. RCA with 30% proximal  narrowing. LVEDP 26 mmHg. RA 18 (prominent "M" appearance), RV 82/21, PA mean 55, PCWP 25. Ao sat 96%, PA sat 54%, Fick Co/Ci 3.2/1.7. PVR 9.4 Wood units. No significant AoV gradient.  CV Surgery:  None  EP Procedures and Devices:  None  Non-Invasive Evaluation(s):  ABI's (07/26/16):Right:ABI notobtained, TBI 1.8. Left:ABI 1.9, TBI 1.0.Triphasic waveforms noted in both lower extremities.  TTE (07/26/16): Normal LV size with moderate LVH. LVEF 55-60% with normal wall motion. Grade 1 diastolic dysfunction. Mild aortic stenosis (mean gradient 11 mmHg). Mitral annular calcification with mild to moderate MR. Mild left atrial enlargement. Normal RV size and function. Normal PA pressure.  TTE (02/25/16): Normal LV size with moderate LVH. LVEF 25-30% with grade 2 diastolic dysfunction. Thickened aortic valve with likely moderate stenosis. MAC with mild MR. Mild LA enlargement. Mildly to moderately reduced RV contraction. Mild-moderate TR. Moderately PH (RVSP 55 mmHg).  Recent CV Pertinent Labs: Lab Results  Component Value Date   CHOL 150 06/22/2016   HDL 47 06/22/2016   LDLCALC 78 06/22/2016   TRIG 125 06/22/2016   CHOLHDL 3.2 06/22/2016   INR 1.0 03/02/2016   BNP 1,592.0 (H) 02/09/2016   K 4.7 08/28/2017   BUN 40 (H) 08/28/2017   BUN 22 11/08/2016   CREATININE 1.36 (H) 08/28/2017   CREATININE 1.31 (H) 12/21/2016    Past medical and surgical history were reviewed and updated in EPIC.  Current Meds  Medication Sig  . amLODipine (NORVASC) 5 MG tablet TAKE 1 TABLET BY MOUTH EVERY DAY  . aspirin EC 81 MG tablet Take 1 tablet (81 mg total) by mouth daily.  . cyanocobalamin 500 MCG tablet Take 500 mcg by mouth daily.  . furosemide (LASIX) 40 MG tablet Take 1 tablet (40 mg total) by mouth daily.  Marland Kitchen HYDROcodone-acetaminophen (NORCO) 10-325 MG  tablet Take 1 tablet by mouth every 8 (eight) hours as needed.  . lovastatin (MEVACOR) 20 MG tablet TAKE 1 TABLET (20 MG TOTAL) BY MOUTH AT  BEDTIME.  . meloxicam (MOBIC) 15 MG tablet TAKE 1 TABLET (15 MG TOTAL) BY MOUTH DAILY AS NEEDED FOR PAIN. TAKE WITH FOOD  . metFORMIN (GLUCOPHAGE-XR) 750 MG 24 hr tablet TAKE 2 TABLETS (1,500 MG TOTAL) BY MOUTH DAILY.  . metoprolol tartrate (LOPRESSOR) 50 MG tablet Take 1 tablet (50 mg total) by mouth 2 (two) times daily.  . nitrofurantoin (MACRODANTIN) 100 MG capsule Take 100 mg by mouth 2 (two) times daily.  . nitrofurantoin, macrocrystal-monohydrate, (MACROBID) 100 MG capsule Take 1 capsule (100 mg total) by mouth 2 (two) times daily for 7 days.  Marland Kitchen spironolactone (ALDACTONE) 25 MG tablet Take 1 tablet (25 mg total) by mouth daily.  . traZODone (DESYREL) 100 MG tablet TAKE 1 TABLET BY MOUTH EVERY NIGHT AT BEDTIME AS NEEDED FOR INSOMNIA  . [DISCONTINUED] spironolactone (ALDACTONE) 25 MG tablet TAKE 1 TABLET (25 MG TOTAL) BY MOUTH DAILY.    Allergies: Hydrochlorothiazide and Penicillins  Social History   Tobacco Use  . Smoking status: Never Smoker  . Smokeless tobacco: Never Used  Substance Use Topics  . Alcohol use: No  . Drug use: No    Family History  Problem Relation Age of Onset  . Cancer Mother        Uterine cancer  . Diabetes Mother   . Heart disease Mother   . Heart failure Mother   . Parkinson's disease Father   . Cancer Sister        breast cancer  . Diabetes Brother        Non-insulin Dependent Diabetes Mellitus  . Mesothelioma Brother   . Hypertension Sister   . Diabetes Sister        Non-insulin dependent Diabetes Mellitus    Review of Systems: A 12-system review of systems was performed and was negative except as noted in the HPI.  --------------------------------------------------------------------------------------------------  Physical Exam: BP (!) 146/98 (BP Location: Right Arm, Patient Position: Sitting, Cuff Size: Normal)   Pulse 78   Ht _0  (1.651 m)   Wt 217 lb 12 oz (98.8 kg)   BMI 36.24 kg/m   General:  NAD. HEENT: No conjunctival  pallor or scleral icterus. Moist mucous membranes.  OP clear. Neck: Supple without lymphadenopathy, thyromegaly, JVD, or HJR. No carotid bruit. Lungs: Normal work of breathing. Clear to auscultation bilaterally without wheezes or crackles. Heart: Regular rate and rhythm without murmurs, rubs, or gallops. Unable to assess PMI due to body habits. Abd: Bowel sounds present. Soft, NT/ND.  Unable to assess HSM. Ext: Trace pretibial edema with compression stockings in place Skin: Warm and dry without rash.  EKG:  NSR with PAC's, borderline LVH, and poor R wave progression.  Lab Results  Component Value Date   WBC 5.2 08/28/2017   HGB 9.3 (L) 08/28/2017   HCT 28.0 (L) 08/28/2017   MCV 90.8 08/28/2017   PLT 238 08/28/2017    Lab Results  Component Value Date   NA 138 08/28/2017   K 4.7 08/28/2017   CL 104 08/28/2017   CO2 26 08/28/2017   BUN 40 (H) 08/28/2017   CREATININE 1.36 (H) 08/28/2017   GLUCOSE 115 (H) 08/28/2017   ALT 10 08/28/2017    Lab Results  Component Value Date   CHOL 150 06/22/2016   HDL 47 06/22/2016   LDLCALC 78 06/22/2016  TRIG 125 06/22/2016   CHOLHDL 3.2 06/22/2016    --------------------------------------------------------------------------------------------------  ASSESSMENT AND PLAN: Chronic systolic and diastolic heart failure due to non-ischemic cardiomyopathy Patient appears stable with minimal leg edema with could also be due to venous insufficiency.  We will continue her current medications, including furosemide 40 mg daily.  Renal function and potassium stable on last check in late August.  Hypertension BP mildly elevated.  We will defer medication changes today.  Sodium restriction and weight loss encouraged.  Urinary frequency Continue treatment for UTI.  No change to diuretic regimen.  If symptoms persist, [name] should speak with her PCP about urology consultation.  Follow-up: Return to clinic in 6 months.  Nelva Bush,  MD 09/27/2017 10:15 PM

## 2017-09-26 ENCOUNTER — Encounter: Payer: Self-pay | Admitting: Internal Medicine

## 2017-09-26 ENCOUNTER — Telehealth: Payer: Self-pay | Admitting: *Deleted

## 2017-09-26 ENCOUNTER — Ambulatory Visit: Payer: Medicare Other | Admitting: Internal Medicine

## 2017-09-26 VITALS — BP 146/98 | HR 78 | Ht 65.0 in | Wt 217.8 lb

## 2017-09-26 DIAGNOSIS — I428 Other cardiomyopathies: Secondary | ICD-10-CM | POA: Diagnosis not present

## 2017-09-26 DIAGNOSIS — I10 Essential (primary) hypertension: Secondary | ICD-10-CM

## 2017-09-26 DIAGNOSIS — I5042 Chronic combined systolic (congestive) and diastolic (congestive) heart failure: Secondary | ICD-10-CM

## 2017-09-26 DIAGNOSIS — R350 Frequency of micturition: Secondary | ICD-10-CM | POA: Diagnosis not present

## 2017-09-26 LAB — URINE CULTURE

## 2017-09-26 MED ORDER — LISINOPRIL 20 MG PO TABS: 20.0000 mg | ORAL_TABLET | Freq: Every day | ORAL | 3 refills | Status: DC

## 2017-09-26 MED ORDER — SPIRONOLACTONE 25 MG PO TABS: 25.0000 mg | ORAL_TABLET | Freq: Every day | ORAL | 3 refills | Status: DC

## 2017-09-26 NOTE — Telephone Encounter (Signed)
No answer and no vm. Will try again later.  

## 2017-09-26 NOTE — Telephone Encounter (Signed)
-----   Message from Birdie Sons, MD sent at 09/26/2017 12:10 PM EDT ----- Urine culture shows infection sensitive to antibiotic that was prescribed. Symptoms should completely resolve by the time antibiotic is finished. Call back otherwise.  Let me know if trouble with bladder Is not much better when finished with antibiotic. If not we will refer to urology.

## 2017-09-26 NOTE — Patient Instructions (Signed)
Medication Instructions: - Your physician recommends that you continue on your current medications as directed. Please refer to the Current Medication list given to you today.  Labwork: - none ordered  Procedures/Testing: - none ordered  Follow-Up: - Your physician wants you to follow-up in: 6 months with Dr. Saunders Revel. You will receive a reminder letter in the mail/ call two months in advance. If you don't receive a letter/call, please call our office to schedule the follow-up appointment.   Any Additional Special Instructions Will Be Listed Below (If Applicable).     If you need a refill on your cardiac medications before your next appointment, please call your pharmacy.

## 2017-09-27 ENCOUNTER — Encounter: Payer: Self-pay | Admitting: Internal Medicine

## 2017-09-27 DIAGNOSIS — I5022 Chronic systolic (congestive) heart failure: Secondary | ICD-10-CM | POA: Insufficient documentation

## 2017-09-27 DIAGNOSIS — I5042 Chronic combined systolic (congestive) and diastolic (congestive) heart failure: Secondary | ICD-10-CM | POA: Insufficient documentation

## 2017-09-27 DIAGNOSIS — R350 Frequency of micturition: Secondary | ICD-10-CM | POA: Insufficient documentation

## 2017-09-27 NOTE — Telephone Encounter (Signed)
Patient's husband advised and verbally voiced understanding.

## 2017-10-29 ENCOUNTER — Telehealth: Payer: Self-pay | Admitting: Family Medicine

## 2017-10-29 NOTE — Telephone Encounter (Signed)
Tried calling; no answer.   Thanks,   -Laura  

## 2017-10-29 NOTE — Telephone Encounter (Signed)
Please check with patient to see if she is doing better with bladder since being treated for UTI. If not then should refer to urology.

## 2017-10-30 MED ORDER — NITROFURANTOIN MACROCRYSTAL 100 MG PO CAPS: 100.0000 mg | ORAL_CAPSULE | Freq: Two times a day (BID) | ORAL | 0 refills | Status: AC

## 2017-10-30 NOTE — Telephone Encounter (Signed)
Patient states she has not had any changes yet and urinating more frequently now. Patient would like for another round of the antibiotics and a call back next week to decide if she wants to be referred to a urologist.,PC

## 2017-12-04 ENCOUNTER — Ambulatory Visit: Payer: Medicare Other | Admitting: Hematology and Oncology

## 2017-12-04 ENCOUNTER — Other Ambulatory Visit: Payer: Medicare Other

## 2017-12-06 ENCOUNTER — Other Ambulatory Visit: Payer: Self-pay | Admitting: Family Medicine

## 2017-12-06 MED ORDER — LOVASTATIN 20 MG PO TABS: 20.0000 mg | ORAL_TABLET | Freq: Every day | ORAL | 4 refills | Status: DC

## 2017-12-06 NOTE — Telephone Encounter (Signed)
[facility] faxed refill request for the following medications:  lovastatin (MEVACOR) 20 MG tablet  90 day supply  Last Rx: 12/05/16 LOV: 09/24/17 Please advise. Thanks TNP

## 2017-12-14 ENCOUNTER — Other Ambulatory Visit: Payer: Self-pay | Admitting: Internal Medicine

## 2018-01-01 DIAGNOSIS — E113393 Type 2 diabetes mellitus with moderate nonproliferative diabetic retinopathy without macular edema, bilateral: Secondary | ICD-10-CM | POA: Diagnosis not present

## 2018-01-25 ENCOUNTER — Ambulatory Visit (INDEPENDENT_AMBULATORY_CARE_PROVIDER_SITE_OTHER): Payer: Medicare Other | Admitting: Family Medicine

## 2018-01-25 ENCOUNTER — Encounter: Payer: Self-pay | Admitting: Family Medicine

## 2018-01-25 VITALS — BP 118/80 | HR 59 | Temp 97.5°F | Resp 18

## 2018-01-25 DIAGNOSIS — E1121 Type 2 diabetes mellitus with diabetic nephropathy: Secondary | ICD-10-CM | POA: Diagnosis not present

## 2018-01-25 DIAGNOSIS — E559 Vitamin D deficiency, unspecified: Secondary | ICD-10-CM

## 2018-01-25 DIAGNOSIS — E7800 Pure hypercholesterolemia, unspecified: Secondary | ICD-10-CM | POA: Diagnosis not present

## 2018-01-25 DIAGNOSIS — E538 Deficiency of other specified B group vitamins: Secondary | ICD-10-CM

## 2018-01-25 DIAGNOSIS — I5042 Chronic combined systolic (congestive) and diastolic (congestive) heart failure: Secondary | ICD-10-CM

## 2018-01-25 DIAGNOSIS — R32 Unspecified urinary incontinence: Secondary | ICD-10-CM | POA: Diagnosis not present

## 2018-01-25 DIAGNOSIS — N390 Urinary tract infection, site not specified: Secondary | ICD-10-CM | POA: Diagnosis not present

## 2018-01-25 DIAGNOSIS — D638 Anemia in other chronic diseases classified elsewhere: Secondary | ICD-10-CM

## 2018-01-25 DIAGNOSIS — I700 Atherosclerosis of aorta: Secondary | ICD-10-CM

## 2018-01-25 LAB — POCT GLYCOSYLATED HEMOGLOBIN (HGB A1C)
ESTIMATED AVERAGE GLUCOSE: 160
Hemoglobin A1C: 7.2 % — AB (ref 4.0–5.6)

## 2018-01-25 LAB — POCT URINALYSIS DIPSTICK
Glucose, UA: NEGATIVE
Nitrite, UA: NEGATIVE
Protein, UA: POSITIVE — AB
SPEC GRAV UA: 1.01 (ref 1.010–1.025)
Urobilinogen, UA: 0.2 E.U./dL
pH, UA: 7.5 (ref 5.0–8.0)

## 2018-01-25 MED ORDER — CIPROFLOXACIN HCL 500 MG PO TABS: 500.0000 mg | ORAL_TABLET | Freq: Two times a day (BID) | ORAL | 0 refills | Status: AC

## 2018-01-25 NOTE — Addendum Note (Signed)
Addended by: Meyer Cory L on: 01/25/2018 11:45 AM   Modules accepted: Orders

## 2018-01-25 NOTE — Patient Instructions (Signed)
.   Please review the attached list of medications and notify my office if there are any errors.   . Please bring all of your medications to every appointment so we can make sure that our medication list is the same as yours.   

## 2018-01-25 NOTE — Progress Notes (Signed)
Patient: [name] Female    DOB: 09/13/1945   73 y.o.   MRN: [id number] Visit Date: 01/25/2018  Today's Provider: Lelon Huh, MD   Chief Complaint  Patient presents with  . Diabetes  . Chronic Kidney Disease  . Hypertension   Subjective:     HPI  Diabetes Mellitus Type II, Follow-up:   Lab Results  Component Value Date   HGBA1C 7.4 (A) 09/24/2017   HGBA1C 7.0 (A) 06/21/2017   HGBA1C 6.8 03/08/2017    Last seen for diabetes 4 months ago.  Management since then includes no changes. She reports good compliance with treatment. She is not having side effects.  Current symptoms include none and have been stable. Home blood sugar records: blood sugars are not checked at home  Episodes of hypoglycemia? no   Current Insulin Regimen: none   Pertinent Labs:    Component Value Date/Time   CHOL 150 06/22/2016 1226   TRIG 125 06/22/2016 1226   HDL 47 06/22/2016 1226   LDLCALC 78 06/22/2016 1226   CREATININE 1.36 (H) 08/28/2017 1014   CREATININE 1.31 (H) 12/21/2016 1221    Wt Readings from Last 3 Encounters:  09/26/17 217 lb 12 oz (98.8 kg)  09/24/17 222 lb (100.7 kg)  08/28/17 214 lb 11.2 oz (97.4 kg)    ------------------------------------------------------------------------  Hypertension, follow-up:  BP Readings from Last 3 Encounters:  01/25/18 118/80  09/26/17 (!) 146/98  09/24/17 134/66    She was last seen for hypertension 6 months ago.  BP at that visit was 104/64. Management since that visit includes no changes. She reports good compliance with treatment. She is not having side effects.  She is not exercising. She is adherent to low salt diet.   Outside blood pressures are not checked. She is experiencing none.  Patient denies none.   Cardiovascular risk factors include advanced age (older than 74 for men, 30 for women), diabetes mellitus and hypertension.  Use of agents associated with hypertension: NSAIDS.     Weight trend:  stable Wt Readings from Last 3 Encounters:  09/26/17 217 lb 12 oz (98.8 kg)  09/24/17 222 lb (100.7 kg)  08/28/17 214 lb 11.2 oz (97.4 kg)    Current diet: in general, an "unhealthy" diet  ------------------------------------------------------------------------  Follow up for CKD:  The patient was last seen for this 4 months ago. Changes made at last visit include none.  She reports good compliance with treatment. She feels that condition is Unchanged. She is not having side effects.   ------------------------------------------------------------------------------------   Follow up urinary incontinence.   She reports today the she absolutes refuses to take furosemide which she stopped about 2 weeks ago due to inability to control bladder and having to get up to urinate several times every hour at night. Her husband feels like she is urinating less since stopping medication, but patient reports she is still getting up frequently at night and having difficulty controlling bladder. She did have UTI at last visit in September and advised to call for urology referral if sx did not improve. She thinks she did improve somewhat with antibiotic, but sx are just as bad now as before treatment.    Follow up CHF She continue Q6 month follow up with Dr. Saunders Revel. States she has not had any trouble with dyspnea, even since stopping furosemide a few weeks ago, although she has been fairly sedentary. She rarely takes a puff of her inhaler, she thinks its the  albuterol, but neither this nor the [facility] has been filled for a few years.   Allergies  Allergen Reactions  . Hydrochlorothiazide     Leg Cramps  . Penicillins Rash     Current Outpatient Medications:  .  amLODipine (NORVASC) 5 MG tablet, TAKE 1 TABLET BY MOUTH EVERY DAY, Disp: 90 tablet, Rfl: 0 .  aspirin EC 81 MG tablet, Take 1 tablet (81 mg total) by mouth daily., Disp: 90 tablet, Rfl: 3 .  cyanocobalamin 500 MCG tablet, Take 500 mcg by  mouth daily., Disp: , Rfl:  .  fluticasone furoate-vilanterol (BREO ELLIPTA) 200-25 MCG/INH AEPB, Inhale 1 puff into the lungs daily. FOR ASTHMA J45.20, Disp: 1 each, Rfl: 5 .  HYDROcodone-acetaminophen (NORCO) 10-325 MG tablet, Take 1 tablet by mouth every 8 (eight) hours as needed., Disp: 90 tablet, Rfl: 0 .  lovastatin (MEVACOR) 20 MG tablet, Take 1 tablet (20 mg total) by mouth at bedtime., Disp: 90 tablet, Rfl: 4 .  meloxicam (MOBIC) 15 MG tablet, TAKE 1 TABLET (15 MG TOTAL) BY MOUTH DAILY AS NEEDED FOR PAIN. TAKE WITH FOOD, Disp: 90 tablet, Rfl: 3 .  metFORMIN (GLUCOPHAGE-XR) 750 MG 24 hr tablet, TAKE 2 TABLETS (1,500 MG TOTAL) BY MOUTH DAILY., Disp: 180 tablet, Rfl: 3 .  spironolactone (ALDACTONE) 25 MG tablet, Take 1 tablet (25 mg total) by mouth daily., Disp: 90 tablet, Rfl: 3 .  traZODone (DESYREL) 100 MG tablet, TAKE 1 TABLET BY MOUTH EVERY NIGHT AT BEDTIME AS NEEDED FOR INSOMNIA, Disp: 90 tablet, Rfl: 4 .  furosemide (LASIX) 40 MG tablet, Take 1 tablet (40 mg total) by mouth daily., Disp: 90 tablet, Rfl: 3 (patient not taking) .  lisinopril (PRINIVIL,ZESTRIL) 20 MG tablet, Take 1 tablet (20 mg total) by mouth daily., Disp: 90 tablet, Rfl: 3 .  metoprolol tartrate (LOPRESSOR) 50 MG tablet, Take 1 tablet (50 mg total) by mouth 2 (two) times daily., Disp: 180 tablet, Rfl: 3  Review of Systems  Constitutional: Negative for appetite change, chills, fatigue and fever.  Respiratory: Negative for chest tightness and shortness of breath.   Cardiovascular: Negative for chest pain and palpitations.  Gastrointestinal: Negative for abdominal pain, nausea and vomiting.  Neurological: Negative for dizziness and weakness.    Social History   Tobacco Use  . Smoking status: Never Smoker  . Smokeless tobacco: Never Used  Substance Use Topics  . Alcohol use: No      Objective:   BP 118/80 (BP Location: Right Arm, Patient Position: Sitting, Cuff Size: Large)   Pulse (!) 59   Temp (!) 97.5 F  (36.4 C) (Oral)   Resp 18   SpO2 97% Comment: room air    Physical Exam  General Appearance:    Alert, cooperative, no distress, obese  Eyes:    PERRL, conjunctiva/corneas clear, EOM's intact       Lungs:     Clear to auscultation bilaterally, respirations unlabored  Heart:    Regular rate and rhythm  Ext:   3+ bipedal edema with compression stocking on. No soreness or erythema.       Results for orders placed or performed in visit on 01/25/18  POCT HgB A1C  Result Value Ref Range   Hemoglobin A1C 7.2 (A) 4.0 - 5.6 %   HbA1c POC (<> result, manual entry)     HbA1c, POC (prediabetic range)     HbA1c, POC (controlled diabetic range)     Est. average glucose Bld gHb Est-mCnc 160  Assessment & Plan    1. Diabetes mellitus with nephropathy ([place]) Well controlled on current medications. Considering urinary incontinence would not be candidate SLGT2 inhibitor. Continue current medications.   - POCT HgB A1C  2. Chronic combined systolic and diastolic heart failure ([place]) Well compensated despite refusal to take furosemide. Counseled on role of furosemide in CHF and encourage to take furosemide, especially if she has any shortness of breath or worsening edema.   3. Urinary incontinence, unspecified type Multi-factorial, minimal improvement since she stopped taking furosemide. She is agreeable to urology referral for evaluation .  4. Recurrent UTI Ciprofloxacin 500mg  bid, and refer urology.   5. BMI 40.0-44.9, adult ([place]) Work on improving diet. Not candidate for sglp2 inhibitor, but will consider glp1 agonist  6. Atherosclerosis of aorta ([facility]) Check lipids today, consider higher intensity statin     Lelon Huh, MD  [place]

## 2018-01-26 LAB — CBC WITH DIFFERENTIAL/PLATELET
BASOS ABS: 0 10*3/uL (ref 0.0–0.2)
Basos: 0 %
EOS (ABSOLUTE): 0 10*3/uL (ref 0.0–0.4)
Eos: 0 %
HEMOGLOBIN: 9.6 g/dL — AB (ref 11.1–15.9)
Hematocrit: 29.6 % — ABNORMAL LOW (ref 34.0–46.6)
Immature Grans (Abs): 0 10*3/uL (ref 0.0–0.1)
Immature Granulocytes: 0 %
LYMPHS ABS: 1.2 10*3/uL (ref 0.7–3.1)
Lymphs: 12 %
MCH: 29.2 pg (ref 26.6–33.0)
MCHC: 32.4 g/dL (ref 31.5–35.7)
MCV: 90 fL (ref 79–97)
MONOCYTES: 7 %
Monocytes Absolute: 0.7 10*3/uL (ref 0.1–0.9)
Neutrophils Absolute: 7.7 10*3/uL — ABNORMAL HIGH (ref 1.4–7.0)
Neutrophils: 81 %
PLATELETS: 265 10*3/uL (ref 150–450)
RBC: 3.29 x10E6/uL — AB (ref 3.77–5.28)
RDW: 13.4 % (ref 11.7–15.4)
WBC: 9.6 10*3/uL (ref 3.4–10.8)

## 2018-01-26 LAB — LIPID PANEL
CHOL/HDL RATIO: 2.8 ratio (ref 0.0–4.4)
Cholesterol, Total: 147 mg/dL (ref 100–199)
HDL: 52 mg/dL (ref >39)
LDL Calculated: 76 mg/dL (ref 0–99)
Triglycerides: 97 mg/dL (ref 0–149)
VLDL CHOLESTEROL CAL: 19 mg/dL (ref 5–40)

## 2018-01-26 LAB — COMPREHENSIVE METABOLIC PANEL
ALK PHOS: 78 IU/L (ref 39–117)
ALT: 11 IU/L (ref 0–32)
AST: 25 IU/L (ref 0–40)
Albumin/Globulin Ratio: 1.1 — ABNORMAL LOW (ref 1.2–2.2)
Albumin: 4 g/dL (ref 3.7–4.7)
BILIRUBIN TOTAL: 0.4 mg/dL (ref 0.0–1.2)
BUN / CREAT RATIO: 20 (ref 12–28)
BUN: 26 mg/dL (ref 8–27)
CHLORIDE: 98 mmol/L (ref 96–106)
CO2: 22 mmol/L (ref 20–29)
Calcium: 10.2 mg/dL (ref 8.7–10.3)
Creatinine, Ser: 1.31 mg/dL — ABNORMAL HIGH (ref 0.57–1.00)
GFR calc Af Amer: 47 mL/min/{1.73_m2} — ABNORMAL LOW (ref >59)
GFR calc non Af Amer: 40 mL/min/{1.73_m2} — ABNORMAL LOW (ref >59)
GLUCOSE: 153 mg/dL — AB (ref 65–99)
Globulin, Total: 3.6 g/dL (ref 1.5–4.5)
Potassium: 5.2 mmol/L (ref 3.5–5.2)
Sodium: 136 mmol/L (ref 134–144)
Total Protein: 7.6 g/dL (ref 6.0–8.5)

## 2018-01-26 LAB — VITAMIN D 25 HYDROXY (VIT D DEFICIENCY, FRACTURES): Vit D, 25-Hydroxy: 12.7 ng/mL — ABNORMAL LOW (ref 30.0–100.0)

## 2018-01-26 LAB — VITAMIN B12: Vitamin B-12: 1290 pg/mL — ABNORMAL HIGH (ref 232–1245)

## 2018-01-28 ENCOUNTER — Telehealth: Payer: Self-pay

## 2018-01-28 NOTE — Telephone Encounter (Signed)
Mr. Foor advised. ° ° °Thanks,  ° °-Io Dieujuste  °

## 2018-01-31 ENCOUNTER — Telehealth: Payer: Self-pay

## 2018-01-31 LAB — URINE CULTURE

## 2018-01-31 NOTE — Telephone Encounter (Signed)
LMTCB 01/31/2018  Thanks,   -Mickel Baas

## 2018-01-31 NOTE — Telephone Encounter (Signed)
-----   Message from Birdie Sons, MD sent at 01/31/2018  1:43 PM EST ----- Urine culture shows infection sensitive to antibiotic that was prescribed. Symptoms should completely resolve by the time antibiotic is finished. Call back otherwise.  Follow up with urology, you should here from their office to schedule appt soon.

## 2018-02-01 NOTE — Telephone Encounter (Signed)
Jessicah Croll advised.  (On DPR)  Thanks,   -Mickel Baas

## 2018-02-27 ENCOUNTER — Encounter: Payer: Self-pay | Admitting: Urology

## 2018-02-27 ENCOUNTER — Ambulatory Visit: Payer: Medicare Other | Admitting: Urology

## 2018-02-27 VITALS — BP 108/70 | HR 62 | Ht 65.0 in | Wt 206.0 lb

## 2018-02-27 DIAGNOSIS — N3941 Urge incontinence: Secondary | ICD-10-CM | POA: Diagnosis not present

## 2018-02-27 DIAGNOSIS — R3129 Other microscopic hematuria: Secondary | ICD-10-CM

## 2018-02-27 LAB — URINALYSIS, COMPLETE
Bilirubin, UA: NEGATIVE
Glucose, UA: NEGATIVE
Ketones, UA: NEGATIVE
Nitrite, UA: NEGATIVE
SPEC GRAV UA: 1.02 (ref 1.005–1.030)
Urobilinogen, Ur: 0.2 mg/dL (ref 0.2–1.0)
pH, UA: 5.5 (ref 5.0–7.5)

## 2018-02-27 LAB — MICROSCOPIC EXAMINATION

## 2018-02-27 LAB — BLADDER SCAN AMB NON-IMAGING: Scan Result: 86

## 2018-02-27 MED ORDER — SOLIFENACIN SUCCINATE 5 MG PO TABS: 5.0000 mg | ORAL_TABLET | Freq: Every day | ORAL | 11 refills | Status: DC

## 2018-02-27 NOTE — Patient Instructions (Addendum)
Urinary Incontinence  Urinary incontinence refers to a condition in which a person is unable to control where and when to pass urine. A person with this condition will urinate when he or she does not mean to (involuntarily). What are the causes? This condition may be caused by:  Medicines.  Infections.  Constipation.  Overactive bladder muscles.  Weak bladder muscles.  Weak pelvic floor muscles. These muscles provide support for the bladder, intestine, and, in women, the uterus.  Enlarged prostate in men. The prostate is a gland near the bladder. When it gets too big, it can pinch the urethra. With the urethra blocked, the bladder can weaken and lose the ability to empty properly.  Surgery.  Emotional factors, such as anxiety, stress, or post-traumatic stress disorder (PTSD).  Pelvic organ prolapse. This happens in women when organs shift out of place and into the vagina. This shift can prevent the bladder and urethra from working properly. What increases the risk? The following factors may make you more likely to develop this condition:  Older age.  Obesity and physical inactivity.  Pregnancy and childbirth.  Menopause.  Diseases that affect the nerves or spinal cord (neurological diseases).  Long-term (chronic) coughing. This can increase pressure on the bladder and pelvic floor muscles. What are the signs or symptoms? Symptoms may vary depending on the type of urinary incontinence you have. They include:  A sudden urge to urinate, but passing urine involuntarily before you can get to a bathroom (urge incontinence).  Suddenly passing urine with any activity that forces urine to pass, such as coughing, laughing, exercise, or sneezing (stress incontinence).  Needing to urinate often, but urinating only a small amount, or constantly dribbling urine (overflow incontinence).  Urinating because you cannot get to the bathroom in time due to a physical disability, such as  arthritis or injury, or communication and thinking problems, such as Alzheimer disease (functional incontinence). How is this diagnosed? This condition may be diagnosed based on:  Your medical history.  A physical exam.  Tests, such as: ? Urine tests. ? X-rays of your kidney and bladder. ? Ultrasound. ? CT scan. ? Cystoscopy. In this procedure, a health care provider inserts a tube with a light and camera (cystoscope) through the urethra and into the bladder in order to check for problems. ? Urodynamic testing. These tests assess how well the bladder, urethra, and sphincter can store and release urine. There are different types of urodynamic tests, and they vary depending on what the test is measuring. To help diagnose your condition, your health care provider may recommend that you keep a log of when you urinate and how much you urinate. How is this treated? Treatment for this condition depends on the type of incontinence that you have and its cause. Treatment may include:  Lifestyle changes, such as: ? Quitting smoking. ? Maintaining a healthy weight. ? Staying active. Try to get 150 minutes of moderate-intensity exercise every week. Ask your health care provider which activities are safe for you. ? Eating a healthy diet.  Avoid high-fat foods, like fried foods.  Avoid refined carbohydrates like white bread and white rice.  Limit how much alcohol and caffeine you drink.  Increase your fiber intake. Foods such as fresh fruits, vegetables, beans, and whole grains are healthy sources of fiber.  Pelvic floor muscle exercises.  Bladder training, such as lengthening the amount of time between bathroom breaks, or using the bathroom at regular intervals.  Using techniques to suppress bladder urges.   This can include distraction techniques or controlled breathing exercises.  Medicines to relax the bladder muscles and prevent bladder spasms.  Medicines to help slow or prevent the  growth of a man's prostate.  Botox injections. These can help relax the bladder muscles.  Using pulses of electricity to help change bladder reflexes (electrical nerve stimulation).  For women, using a medical device to prevent urine leaks. This is a small, tampon-like, disposable device that is inserted into the urethra.  Injecting collagen or carbon beads (bulking agents) into the urinary sphincter. These can help thicken tissue and close the bladder opening.  Surgery. Follow these instructions at home: Lifestyle  Limit alcohol and caffeine. These can fill your bladder quickly and irritate it.  Keep yourself clean to help prevent odors and skin damage. Ask your doctor about special skin creams and cleansers that can protect the skin from urine.  Consider wearing pads or adult diapers. Make sure to change them regularly, and always change them right after experiencing incontinence. General instructions  Take over-the-counter and prescription medicines only as told by your health care provider.  Use the bathroom about every 3-4 hours, even if you do not feel the need to urinate. Try to empty your bladder completely every time. After urinating, wait a minute. Then try to urinate again.  Make sure you are in a relaxed position while urinating.  If your incontinence is caused by nerve problems, keep a log of the medicines you take and the times you go to the bathroom.  Keep all follow-up visits as told by your health care provider. This is important. Contact a health care provider if:  You have pain that gets worse.  Your incontinence gets worse. Get help right away if:  You have a fever or chills.  You are unable to urinate.  You have redness in your groin area or down your legs. Summary  Urinary incontinence refers to a condition in which a person is unable to control where and when to pass urine.  This condition may be caused by medicines, infection, weak bladder  muscles, weak pelvic floor muscles, enlargement of the prostate (in men), or surgery.  The following factors increase your risk for developing this condition: older age, obesity, pregnancy and childbirth, menopause, neurological diseases, and chronic coughing.  There are several types of urinary incontinence. They include urge incontinence, stress incontinence, overflow incontinence, and functional incontinence.  This condition is usually treated first with lifestyle and behavioral changes, such as quitting smoking, eating a healthier diet, and doing regular pelvic floor exercises. Other treatment options include medicines, bulking agents, medical devices, electrical nerve stimulation, or surgery. This information is not intended to replace advice given to you by your health care provider. Make sure you discuss any questions you have with your health care provider. Document Released: 01/27/2004 Document Revised: 03/30/2016 Document Reviewed: 03/30/2016 Elsevier Interactive Patient Education  2019 [employer]  Hematuria, Adult Hematuria is blood in the urine. Blood may be visible in the urine, or it may be identified with a test. This condition can be caused by infections of the bladder, urethra, kidney, or prostate. Other possible causes include:  Kidney stones.  Cancer of the urinary tract.  Too much calcium in the urine.  Conditions that are passed from parent to child (inherited conditions).  Exercise that requires a lot of energy. Infections can usually be treated with medicine, and a kidney stone usually will pass through your urine. If neither of these is the cause  of your hematuria, more tests may be needed to identify the cause of your symptoms. It is very important to tell your health care provider about any blood in your urine, even if it is painless or the blood stops without treatment. Blood in the urine, when it happens and then stops and then happens again, can be a symptom  of a very serious condition, including cancer. There is no pain in the initial stages of many urinary cancers. Follow these instructions at home: Medicines  Take over-the-counter and prescription medicines only as told by your health care provider.  If you were prescribed an antibiotic medicine, take it as told by your health care provider. Do not stop taking the antibiotic even if you start to feel better. Eating and drinking  Drink enough fluid to keep your urine clear or pale yellow. It is recommended that you drink 3-4 quarts (2.8-3.8 L) a day. If you have been diagnosed with an infection, it is recommended that you drink cranberry juice in addition to large amounts of water.  Avoid caffeine, tea, and carbonated beverages. These tend to irritate the bladder.  Avoid alcohol because it may irritate the prostate (men). General instructions  If you have been diagnosed with a kidney stone, follow your health care provider's instructions about straining your urine to catch the stone.  Empty your bladder often. Avoid holding urine for long periods of time.  If you are female: ? After a bowel movement, wipe from front to back and use each piece of toilet paper only once. ? Empty your bladder before and after sex.  Pay attention to any changes in your symptoms. Tell your health care provider about any changes or any new symptoms.  It is your responsibility to get your test results. Ask your health care provider, or the department performing the test, when your results will be ready.  Keep all follow-up visits as told by your health care provider. This is important. Contact a health care provider if:  You develop back pain.  You have a fever.  You have nausea or vomiting.  Your symptoms do not improve after 3 days.  Your symptoms get worse. Get help right away if:  You develop severe vomiting and are unable take medicine without vomiting.  You develop severe pain in your back or  abdomen even though you are taking medicine.  You pass a large amount of blood in your urine.  You pass blood clots in your urine.  You feel very weak or like you might faint.  You faint. Summary  Hematuria is blood in the urine. It has many possible causes.  It is very important that you tell your health care provider about any blood in your urine, even if it is painless or the blood stops without treatment.  Take over-the-counter and prescription medicines only as told by your health care provider.  Drink enough fluid to keep your urine clear or pale yellow. This information is not intended to replace advice given to you by your health care provider. Make sure you discuss any questions you have with your health care provider. Document Released: 12/19/2004 Document Revised: 01/22/2016 Document Reviewed: 01/22/2016 Elsevier Interactive Patient Education  2019 [employer].  Cystoscopy  Cystoscopy is a procedure that is used to help diagnose and sometimes treat conditions that affect that lower urinary tract. The lower urinary tract includes the bladder and the tube that drains urine from the bladder out of the body (urethra). Cystoscopy is performed  with a thin, tube-shaped instrument with a light and camera at the end (cystoscope). The cystoscope may be hard (rigid) or flexible, depending on the goal of the procedure.The cystoscope is inserted through the urethra, into the bladder. Cystoscopy may be recommended if you have:  Urinary tractinfections that keep coming back (recurring).  Blood in the urine (hematuria).  Loss of bladder control (urinary incontinence) or an overactive bladder.  Unusual cells found in a urine sample.  A blockage in the urethra.  Painful urination.  An abnormality in the bladder found during an intravenous pyelogram (IVP) or CT scan. Cystoscopy may also be done to remove a sample of tissue to be examined under a microscope (biopsy). Tell a health  care provider about:  Any allergies you have.  All medicines you are taking, including vitamins, herbs, eye drops, creams, and over-the-counter medicines.  Any problems you or family members have had with anesthetic medicines.  Any blood disorders you have.  Any surgeries you have had.  Any medical conditions you have.  Whether you are pregnant or may be pregnant. What are the risks? Generally, this is a safe procedure. However, problems may occur, including:  Infection.  Bleeding.  Allergic reactions to medicines.  Damage to other structures or organs. What happens before the procedure?  Ask your health care provider about: ? Changing or stopping your regular medicines. This is especially important if you are taking diabetes medicines or blood thinners. ? Taking medicines such as aspirin and ibuprofen. These medicines can thin your blood. Do not take these medicines before your procedure if your health care provider instructs you not to.  Follow instructions from your health care provider about eating or drinking restrictions.  You may be given antibiotic medicine to help prevent infection.  You may have an exam or testing, such as X-rays of the bladder, urethra, or kidneys.  You may have urine tests to check for signs of infection.  Plan to have someone take you home after the procedure. What happens during the procedure?  To reduce your risk of infection,your health care team will wash or sanitize their hands.  You will be given one or more of the following: ? A medicine to help you relax (sedative). ? A medicine to numb the area (local anesthetic).  The area around the opening of your urethra will be cleaned.  The cystoscope will be passed through your urethra into your bladder.  Germ-free (sterile)fluid will flow through the cystoscope to fill your bladder. The fluid will stretch your bladder so that your surgeon can clearly examine your bladder  walls.  The cystoscope will be removed and your bladder will be emptied. The procedure may vary among health care providers and hospitals. What happens after the procedure?  You may have some soreness or pain in your abdomen and urethra. Medicines will be available to help you.  You may have some blood in your urine.  Do not drive for 24 hours if you received a sedative. This information is not intended to replace advice given to you by your health care provider. Make sure you discuss any questions you have with your health care provider. Document Released: 12/17/1999 Document Revised: 09/29/2016 Document Reviewed: 11/05/2014 Elsevier Interactive Patient Education  2019 [employer].

## 2018-02-27 NOTE — Progress Notes (Signed)
02/27/2018 8:48 AM   [name] 23-Mar-1945 [id number]  Referring provider: Birdie Sons, MD [address] [place], [place] [address]  Chief Complaint  Patient presents with  . Urinary Incontinence    HPI:  73 yo female who complains of urgency and UUI. Noc x 3. She wears "a bunch" of pads per day. Maybe 10. No gross hematuria. She has knee pain and is in a wheelchair. She is sedentary and in a wheelchair. Stopping diuretics did not help. She voids about every 2 - 3 hours. She sleeps sitting up in a chair. NG risk includes DM. CKD with gfr ~ 40. No constipation. No dysuria. No pelvic surgery. No prolapse symptoms. PVR 86 ml. Symptoms no change p abx. She drinks juice and water. She drinks a lot of coffee in the morning.   UA today with 3-10 rbcs. Prior urine cultures grew aerococcus and e coli. A Mar 2018 CT / PET showed a normal GU tract done for MM.   Modifying factors: There are no other modifying factors  Associated signs and symptoms: There are no other associated signs and symptoms Aggravating and relieving factors: There are no other aggravating or relieving factors Severity: Moderate Duration: Persistent   PMH: Past Medical History:  Diagnosis Date  . Aortic stenosis   . Asthma   . Coronary artery disease   . Diabetes mellitus without complication ([place])   . History of measles   . Hypertension   . Nonischemic cardiomyopathy ([place])   . Pulmonary hypertension ([place])     Surgical History: Past Surgical History:  Procedure Laterality Date  . Cyst(solitary) of breast:removed    . RIGHT/LEFT HEART CATH AND CORONARY ANGIOGRAPHY N/A 03/07/2016   Procedure: Right/Left Heart Cath and Coronary Angiography;  Surgeon: Nelva Bush, MD;  Location: [facility];  Service: Cardiovascular;  Laterality: N/A;  . TUBAL LIGATION      Home Medications:  Allergies as of 02/27/2018      Reactions   Hydrochlorothiazide    Leg Cramps   Penicillins Rash        Medication List       Accurate as of February 27, 2018  8:48 AM. Always use your most recent med list.        amLODipine 5 MG tablet Commonly known as:  NORVASC TAKE 1 TABLET BY MOUTH EVERY DAY   aspirin EC 81 MG tablet Take 1 tablet (81 mg total) by mouth daily.   furosemide 40 MG tablet Commonly known as:  LASIX Take 1 tablet (40 mg total) by mouth daily.   HYDROcodone-acetaminophen 10-325 MG tablet Commonly known as:  NORCO Take 1 tablet by mouth every 8 (eight) hours as needed.   lisinopril 20 MG tablet Commonly known as:  PRINIVIL,ZESTRIL Take 1 tablet (20 mg total) by mouth daily.   lisinopril 20 MG tablet Commonly known as:  PRINIVIL,ZESTRIL Take 20 mg by mouth daily.   lovastatin 20 MG tablet Commonly known as:  MEVACOR Take 1 tablet (20 mg total) by mouth at bedtime.   meloxicam 15 MG tablet Commonly known as:  MOBIC TAKE 1 TABLET (15 MG TOTAL) BY MOUTH DAILY AS NEEDED FOR PAIN. TAKE WITH FOOD   metFORMIN 750 MG 24 hr tablet Commonly known as:  GLUCOPHAGE-XR TAKE 2 TABLETS (1,500 MG TOTAL) BY MOUTH DAILY.   metoprolol tartrate 50 MG tablet Commonly known as:  LOPRESSOR Take 1 tablet (50 mg total) by mouth 2 (two) times daily.  spironolactone 25 MG tablet Commonly known as:  ALDACTONE Take 1 tablet (25 mg total) by mouth daily.   traZODone 100 MG tablet Commonly known as:  DESYREL TAKE 1 TABLET BY MOUTH EVERY NIGHT AT BEDTIME AS NEEDED FOR INSOMNIA   vitamin B-12 500 MCG tablet Commonly known as:  CYANOCOBALAMIN Take 500 mcg by mouth daily.       Allergies:  Allergies  Allergen Reactions  . Hydrochlorothiazide     Leg Cramps  . Penicillins Rash    Family History: Family History  Problem Relation Age of Onset  . Cancer Mother        Uterine cancer  . Diabetes Mother   . Heart disease Mother   . Heart failure Mother   . Parkinson's disease Father   . Cancer Sister        breast cancer  . Diabetes Brother        Non-insulin  Dependent Diabetes Mellitus  . Mesothelioma Brother   . Hypertension Sister   . Diabetes Sister        Non-insulin dependent Diabetes Mellitus    Social History:  reports that she has never smoked. She has never used smokeless tobacco. She reports that she does not drink alcohol or use drugs.  ROS: UROLOGY Frequent Urination?: Yes Hard to postpone urination?: No Burning/pain with urination?: No Get up at night to urinate?: Yes Leakage of urine?: Yes Urine stream starts and stops?: No Trouble starting stream?: No Do you have to strain to urinate?: No Blood in urine?: No Urinary tract infection?: No Sexually transmitted disease?: No Injury to kidneys or bladder?: No Painful intercourse?: No Weak stream?: No Currently pregnant?: No Vaginal bleeding?: No Last menstrual period?: n  Gastrointestinal Nausea?: No Vomiting?: No Indigestion/heartburn?: No Diarrhea?: No Constipation?: No  Constitutional Fever: No Night sweats?: No Weight loss?: No Fatigue?: No  Skin Skin rash/lesions?: No Itching?: No  Eyes Blurred vision?: No Double vision?: No  Ears/Nose/Throat Sore throat?: No Sinus problems?: No  Hematologic/Lymphatic Swollen glands?: No Easy bruising?: No  Cardiovascular Leg swelling?: No Chest pain?: No  Respiratory Cough?: No Shortness of breath?: No  Endocrine Excessive thirst?: No  Musculoskeletal Back pain?: No Joint pain?: No  Neurological Headaches?: No Dizziness?: No  Psychologic Depression?: No Anxiety?: No  Physical Exam: BP 108/70 (BP Location: Left Arm, Patient Position: Sitting, Cuff Size: Normal)   Pulse 62   Ht 5\' 5"  (1.651 m)   Wt 93.4 kg   BMI 34.28 kg/m   Constitutional:  Alert and oriented, No acute distress. HEENT: [place] AT, moist mucus membranes.  Trachea midline, no masses. Cardiovascular: No clubbing, cyanosis, or edema. Respiratory: Normal respiratory effort, no increased work of breathing. GI: Abdomen is soft,  nontender, nondistended, no abdominal masses GU: No CVA tenderness Lymph: No cervical or lymphadenopathy. Skin: No rashes, bruises or suspicious lesions. Neurologic: Grossly intact, no focal deficits, moving all 4 extremities. Psychiatric: Normal mood and affect.  Laboratory Data: Lab Results  Component Value Date   WBC 9.6 01/25/2018   HGB 9.6 (L) 01/25/2018   HCT 29.6 (L) 01/25/2018   MCV 90 01/25/2018   PLT 265 01/25/2018    Lab Results  Component Value Date   CREATININE 1.31 (H) 01/25/2018    No results found for: PSA  No results found for: TESTOSTERONE  Lab Results  Component Value Date   HGBA1C 7.2 (A) 01/25/2018    Urinalysis    Component Value Date/Time   BILIRUBINUR small 01/25/2018 1143  KETONESUR negative 09/21/2016 1249   PROTEINUR Positive (A) 01/25/2018 1143   UROBILINOGEN 0.2 01/25/2018 1143   NITRITE negative 01/25/2018 1143   LEUKOCYTESUR Moderate (2+) (A) 01/25/2018 1143    No results found for: LABMICR, WBCUA, RBCUA, LABEPIT, MUCUS, BACTERIA  Pertinent Imaging: CT / PET  No results found for this or any previous visit. No results found for this or any previous visit. No results found for this or any previous visit. No results found for this or any previous visit. No results found for this or any previous visit. No results found for this or any previous visit. No results found for this or any previous visit. No results found for this or any previous visit.  Assessment & Plan:    1. Urinary incontinence, unspecified type Discussed nature r/b of PT, anticholinergics, Myrbetriq and others such as PTNS. Trial of solifenacin and timed voiding.   - Urinalysis, Complete - Bladder Scan (Post Void Residual) in office  2- MH - schedule renal US and cystoscopy   No follow-ups on file.  Festus Aloe, MD  [facility] [address], [place], [place] [address] [phone]

## 2018-03-03 DEATH — deceased

## 2018-03-05 ENCOUNTER — Other Ambulatory Visit: Payer: Self-pay

## 2018-03-05 ENCOUNTER — Other Ambulatory Visit: Payer: Self-pay | Admitting: Family Medicine

## 2018-03-05 MED ORDER — METOPROLOL TARTRATE 50 MG PO TABS: 50.0000 mg | ORAL_TABLET | Freq: Two times a day (BID) | ORAL | 0 refills | Status: DC

## 2018-03-05 NOTE — Telephone Encounter (Signed)
Requested Prescriptions   Signed Prescriptions Disp Refills  . metoprolol tartrate (LOPRESSOR) 50 MG tablet 180 tablet 0    Sig: Take 1 tablet (50 mg total) by mouth 2 (two) times daily.    Authorizing Provider: END, CHRISTOPHER    Ordering User: Janan Ridge

## 2018-03-06 ENCOUNTER — Ambulatory Visit
Admission: RE | Admit: 2018-03-06 | Discharge: 2018-03-06 | Disposition: A | Discharge status: Home / Self Care | Payer: Medicare Other | Source: Ambulatory Visit | Attending: Urology | Admitting: Urology

## 2018-03-06 DIAGNOSIS — R3129 Other microscopic hematuria: Secondary | ICD-10-CM | POA: Diagnosis not present

## 2018-03-06 DIAGNOSIS — N281 Cyst of kidney, acquired: Secondary | ICD-10-CM | POA: Diagnosis not present

## 2018-03-18 ENCOUNTER — Other Ambulatory Visit: Payer: Self-pay | Admitting: *Deleted

## 2018-03-18 MED ORDER — AMLODIPINE BESYLATE 5 MG PO TABS: 5.0000 mg | ORAL_TABLET | Freq: Every day | ORAL | 0 refills | Status: DC

## 2018-04-03 ENCOUNTER — Other Ambulatory Visit: Payer: Self-pay | Admitting: Urology

## 2018-04-10 ENCOUNTER — Telehealth: Payer: Self-pay

## 2018-04-10 NOTE — Telephone Encounter (Signed)
Virtual Visit Pre-Appointment Phone Call  Steps For Call:  1. Confirm consent - "In the setting of the current Covid19 crisis, you are scheduled for a phone visit with your provider on April 26, 2018 at 11:30.  Just as we do with many in-office visits, in order for you to participate in this visit, we must obtain consent.  If you'd like, I can send this to your mychart (if signed up) or email for you to review.  Otherwise, I can obtain your verbal consent now.  All virtual visits are billed to your insurance company just like a normal visit would be.  By agreeing to a virtual visit, we'd like you to understand that the technology does not allow for your provider to perform an examination, and thus may limit your provider's ability to fully assess your condition.  Finally, though the technology is pretty good, we cannot assure that it will always work on either your or our end, and in the setting of a video visit, we may have to convert it to a phone-only visit.  In either situation, we cannot ensure that we have a secure connection.  Are you willing to proceed?"  2. Advise patient to be prepared with any vital sign or heart rhythm information, their current medicines, and a piece of paper and pen handy for any instructions they may receive the day of their visit  3. Inform patient they will receive a phone call 15 minutes prior to their appointment time (may be from unknown caller ID) so they should be prepared to answer  4. Confirm that appointment type is correct in Epic appointment notes (video vs telephone)    TELEPHONE CALL NOTE  HANALEI GLACE has been deemed a candidate for a follow-up tele-health visit to limit community exposure during the Covid-19 pandemic. I spoke with the patient via phone to ensure availability of phone/video source, confirm preferred email & phone number, and discuss instructions and expectations.  I reminded [name] to be prepared with any vital sign and/or  heart rhythm information that could potentially be obtained via home monitoring, at the time of her visit. I reminded [name] to expect a phone call at the time of her visit if her visit.  Did the patient verbally acknowledge consent to treatment? YES PER [place], LISTED ON DPR  [name] [place]ain 04/10/2018 4:16 PM   DOWNLOADING THE [place]  - If Apple, go to [employer] and type in WebEx in the search bar. [place] [employer], the blue/green circle. The app is free but as with any other app downloads, their phone may require them to verify saved payment information or Apple password. The patient does NOT have to create an account.  - If Android, ask patient to go to [employer] and type in WebEx in the search bar. [place] [employer], the blue/green circle. The app is free but as with any other app downloads, their phone may require them to verify saved payment information or Android password. The patient does NOT have to create an account.   CONSENT FOR TELE-HEALTH VISIT - PLEASE REVIEW  I hereby voluntarily request, consent and authorize [facility] and its employed or contracted physicians, physician assistants, nurse practitioners or other licensed health care professionals (the Practitioner), to provide me with telemedicine health care services (the "Services") as deemed necessary by the treating Practitioner. I acknowledge and consent to receive the Services  by the Practitioner via telemedicine. I understand that the telemedicine visit will involve communicating with the Practitioner through live audiovisual communication technology and the disclosure of certain medical information by electronic transmission. I acknowledge that I have been given the opportunity to request an in-person assessment or other available alternative prior to the telemedicine visit and am voluntarily participating in the telemedicine visit.   I understand that I have the right to withhold or withdraw my consent to the use of telemedicine in the course of my care at any time, without affecting my right to future care or treatment, and that the Practitioner or I may terminate the telemedicine visit at any time. I understand that I have the right to inspect all information obtained and/or recorded in the course of the telemedicine visit and may receive copies of available information for a reasonable fee.  I understand that some of the potential risks of receiving the Services via telemedicine include:  Marland Kitchen Delay or interruption in medical evaluation due to technological equipment failure or disruption; . Information transmitted may not be sufficient (e.g. poor resolution of images) to allow for appropriate medical decision making by the Practitioner; and/or  . In rare instances, security protocols could fail, causing a breach of personal health information.  Furthermore, I acknowledge that it is my responsibility to provide information about my medical history, conditions and care that is complete and accurate to the best of my ability. I acknowledge that Practitioner's advice, recommendations, and/or decision may be based on factors not within their control, such as incomplete or inaccurate data provided by me or distortions of diagnostic images or specimens that may result from electronic transmissions. I understand that the practice of medicine is not an exact science and that Practitioner makes no warranties or guarantees regarding treatment outcomes. I acknowledge that I will receive a copy of this consent concurrently upon execution via email to the email address I last provided but may also request a printed copy by calling the office of [place].    I understand that my insurance will be billed for this visit.   I have read or had this consent read to me. . I understand the contents of this consent, which adequately explains the  benefits and risks of the Services being provided via telemedicine.  . I have been provided ample opportunity to ask questions regarding this consent and the Services and have had my questions answered to my satisfaction. . I give my informed consent for the services to be provided through the use of telemedicine in my medical care  By participating in this telemedicine visit I agree to the above.

## 2018-04-26 ENCOUNTER — Telehealth (INDEPENDENT_AMBULATORY_CARE_PROVIDER_SITE_OTHER): Payer: Medicare Other | Admitting: Internal Medicine

## 2018-04-26 ENCOUNTER — Other Ambulatory Visit: Payer: Self-pay

## 2018-04-26 ENCOUNTER — Encounter: Payer: Self-pay | Admitting: Internal Medicine

## 2018-04-26 VITALS — BP 120/75 | HR 58 | Ht 64.0 in | Wt 208.0 lb

## 2018-04-26 DIAGNOSIS — I10 Essential (primary) hypertension: Secondary | ICD-10-CM | POA: Diagnosis not present

## 2018-04-26 DIAGNOSIS — I350 Nonrheumatic aortic (valve) stenosis: Secondary | ICD-10-CM | POA: Diagnosis not present

## 2018-04-26 DIAGNOSIS — E785 Hyperlipidemia, unspecified: Secondary | ICD-10-CM

## 2018-04-26 DIAGNOSIS — R350 Frequency of micturition: Secondary | ICD-10-CM

## 2018-04-26 DIAGNOSIS — I5042 Chronic combined systolic (congestive) and diastolic (congestive) heart failure: Secondary | ICD-10-CM

## 2018-04-26 NOTE — Patient Instructions (Signed)
Medication Instructions:  Your physician recommends that you continue on your current medications as directed. Please refer to the Current Medication list given to you today.  If you need a refill on your cardiac medications before your next appointment, please call your pharmacy.   Lab work: Your physician recommends that you return for lab work in: 2 months, approximately June 24th for BMET. Please call our office in June to schedule this lab work if you have not already had it done at your Primary Care Provider.    If you have labs (blood work) drawn today and your tests are completely normal, you will receive your results only by: Marland Kitchen MyChart Message (if you have MyChart) OR . A paper copy in the mail If you have any lab test that is abnormal or we need to change your treatment, we will call you to review the results.  Testing/Procedures: none  Follow-Up: At [facility], you and your health needs are our priority.  As part of our continuing mission to provide you with exceptional heart care, we have created designated Provider Care Teams.  These Care Teams include your primary Cardiologist (physician) and Advanced Practice Providers (APPs -  Physician Assistants and Nurse Practitioners) who all work together to provide you with the care you need, when you need it. You will need a follow up appointment in 4 months.  Please call our office 2 months in advance to schedule this appointment.  You may see Nelva Bush, MD or one of the following Advanced Practice Providers on your designated Care Team:   Murray Hodgkins, NP Christell Faith, PA-C . Marrianne Mood, PA-C

## 2018-04-26 NOTE — Progress Notes (Signed)
Virtual Visit via Telephone Note   This visit type was conducted due to national recommendations for restrictions regarding the COVID-19 Pandemic (e.g. social distancing) in an effort to limit this patient's exposure and mitigate transmission in our community.  Due to her co-morbid illnesses, this patient is at least at moderate risk for complications without adequate follow up.  This format is felt to be most appropriate for this patient at this time.  The patient did not have access to video technology/had technical difficulties with video requiring transitioning to audio format only (telephone).  All issues noted in this document were discussed and addressed.  No physical exam could be performed with this format.  Please refer to the patient's chart for her  consent to telehealth for [facility].   Evaluation Performed:  Follow-up visit  Date:  04/26/2018   ID:  [name], DOB [birth year], MRN [id number]  Patient Location: Home Provider Location: Office  PCP:  Birdie Sons, MD  Cardiologist:  Nelva Bush, MD Electrophysiologist:  None   Chief Complaint:  Follow-up heart failure  History of Present Illness:    [name] is a 73 y.o. female with history of  non-obstructive CAD, NICM with chronic systolic and diastolic heart failure, HTN, DM, and multiple myeloma.  I last saw her in 09/2017, at which time she was doing well other than urinary frequency and incontinence that she attributed to furosemide.  Leg edema was well-controlled with furosemide.  We did not make any medication changes at that time.  Today, [name] reports that she is feeling well.  Her only complaint is of continued urinary frequency (especially after taking furosemide).  However, nocturia has improved since Vesicare was added.  She denies shortness of breath and edema.  She also denies chest pain, palpitations, and lightheadedness.  BP is well-controlled, with most readings similar to today's.  The  patient does not have symptoms concerning for COVID-19 infection (fever, chills, cough, or new shortness of breath).    Past Medical History:  Diagnosis Date  . Aortic stenosis   . Asthma   . Coronary artery disease   . Diabetes mellitus without complication ([place])   . History of measles   . Hypertension   . Nonischemic cardiomyopathy ([place])   . Pulmonary hypertension ([place])    Past Surgical History:  Procedure Laterality Date  . Cyst(solitary) of breast:removed    . RIGHT/LEFT HEART CATH AND CORONARY ANGIOGRAPHY N/A 03/07/2016   Procedure: Right/Left Heart Cath and Coronary Angiography;  Surgeon: Nelva Bush, MD;  Location: [place] [facility];  Service: Cardiovascular;  Laterality: N/A;  . TUBAL LIGATION       Current Meds  Medication Sig  . amLODipine (NORVASC) 5 MG tablet Take 1 tablet (5 mg total) by mouth daily.  Marland Kitchen aspirin EC 81 MG tablet Take 1 tablet (81 mg total) by mouth daily.  . cyanocobalamin 500 MCG tablet Take 500 mcg by mouth daily.  . furosemide (LASIX) 40 MG tablet Take 1 tablet (40 mg total) by mouth daily.  Marland Kitchen HYDROcodone-acetaminophen (NORCO) 10-325 MG tablet Take 1 tablet by mouth every 8 (eight) hours as needed.  Marland Kitchen lisinopril (PRINIVIL,ZESTRIL) 20 MG tablet Take 20 mg by mouth daily.  Marland Kitchen lovastatin (MEVACOR) 20 MG tablet Take 1 tablet (20 mg total) by mouth at bedtime.  . meloxicam (MOBIC) 15 MG tablet TAKE 1 TABLET (15 MG TOTAL) BY MOUTH DAILY AS NEEDED FOR PAIN. TAKE WITH FOOD  . metFORMIN (GLUCOPHAGE-XR) 750  MG 24 hr tablet TAKE 2 TABLETS (1,500 MG TOTAL) BY MOUTH DAILY.  . metoprolol tartrate (LOPRESSOR) 50 MG tablet Take 1 tablet (50 mg total) by mouth 2 (two) times daily.  . solifenacin (VESICARE) 5 MG tablet Take 1 tablet (5 mg total) by mouth daily.  Marland Kitchen spironolactone (ALDACTONE) 25 MG tablet Take 1 tablet (25 mg total) by mouth daily.  . traZODone (DESYREL) 100 MG tablet TAKE 1 TABLET BY MOUTH EVERY NIGHT AT BEDTIME AS NEEDED FOR INSOMNIA      Allergies:   Hydrochlorothiazide and Penicillins   Social History   Tobacco Use  . Smoking status: Never Smoker  . Smokeless tobacco: Never Used  Substance Use Topics  . Alcohol use: No  . Drug use: No     Family Hx: The patient's family history includes Cancer in her mother and sister; Diabetes in her brother, mother, and sister; Heart disease in her mother; Heart failure in her mother; Hypertension in her sister; Mesothelioma in her brother; Parkinson's disease in her father.  ROS:   Please see the history of present illness.   All other systems reviewed and are negative.   Prior CV studies:   The following studies were reviewed today:  Cath/PCI:  LHC/RHC (03/07/16): LMCA normal. LAD with 30% ostial stenosi, 30$ midvessel narrowing, and 50% distal disease. LCx with 40% midvessel lesion. RCA with 30% proximal narrowing. LVEDP 26 mmHg. RA 18 (prominent "M" appearance), RV 82/21, PA mean 55, PCWP 25. Ao sat 96%, PA sat 54%, Fick Co/Ci 3.2/1.7. PVR 9.4 Wood units. No significant AoV gradient.  Non-Invasive Evaluation(s):  ABI's (07/26/16):Right:ABI notobtained, TBI 1.8. Left:ABI 1.9, TBI 1.0.Triphasic waveforms noted in both lower extremities.  TTE (07/26/16): Normal LV size with moderate LVH. LVEF 55-60% with normal wall motion. Grade 1 diastolic dysfunction. Mild aortic stenosis (mean gradient 11 mmHg). Mitral annular calcification with mild to moderate MR. Mild left atrial enlargement. Normal RV size and function. Normal PA pressure.  TTE (02/25/16): Normal LV size with moderate LVH. LVEF 25-30% with grade 2 diastolic dysfunction. Thickened aortic valve with likely moderate stenosis. MAC with mild MR. Mild LA enlargement. Mildly to moderately reduced RV contraction. Mild-moderate TR. Moderately PH (RVSP 55 mmHg).  Labs/Other Tests and Data Reviewed:    EKG:  No ECG reviewed.  Recent Labs: 05/29/2017: TSH 0.482 01/25/2018: ALT 11; BUN 26; Creatinine, Ser 1.31; Hemoglobin 9.6;  Platelets 265; Potassium 5.2; Sodium 136   Recent Lipid Panel Lab Results  Component Value Date/Time   CHOL 147 01/25/2018 12:11 PM   TRIG 97 01/25/2018 12:11 PM   HDL 52 01/25/2018 12:11 PM   CHOLHDL 2.8 01/25/2018 12:11 PM   LDLCALC 76 01/25/2018 12:11 PM    Wt Readings from Last 3 Encounters:  04/26/18 208 lb (94.3 kg)  02/27/18 206 lb (93.4 kg)  09/26/17 217 lb 12 oz (98.8 kg)     Objective:    Vital Signs:  BP 120/75 (BP Location: Left Arm, Patient Position: Sitting, Cuff Size: Normal)   Pulse (!) 58   Ht _0  (1.626 m)   Wt 208 lb (94.3 kg)   BMI 35.70 kg/m    VITAL SIGNS:  reviewed  ASSESSMENT & PLAN:    Chronic systolic and diastolic heart failure due to non-ischemic cardiomyopathy: No significant symptoms reported, though mobility is limited (patient ambulates slowly with a walker).  Edema is well controlled with stable weight.  We will continue her current regimen of metoprolol tartrate, lisinopril, and spironolactone.  BMP drawn  by Dr. Caryn Section in January was notable for stable renal function and borderline high potassium.  In the setting of ACEI and aldosterone antagonist use, I would like to repeat a BMP as soon as COVID-19 precautions have been eased.  Ms. Teagarden is scheduled for f/u with Dr. Caryn Section next month as well.  Labs could be drawn then if she is seen in his office.  Aortic stenosis: No symptoms of severe aortic stenosis.  Mean gradient was minimally elevated (11 mmHg) on last echo in 07/2016. Continue clinical follow-up.  Hypertension: Blood pressure is well-controlled.  Continue current regimen.  Hyperlipidemia: On lovastatin; ongoing management per Dr. Caryn Section.  Urinary frequency: This is likely multifactorial, including diuretic use and urge incontinence/bladder spasms.  Symptoms have improved with Vesicare.  We discussed making furosemide as needed with close monitoring of weight and edema, but have agreed to defer this for now.  COVID-19  Education: The signs and symptoms of COVID-19 were discussed with the patient and how to seek care for testing (follow up with PCP or arrange E-visit).  The importance of social distancing was discussed today.  Time:   Today, I have spent 12 minutes with the patient with telehealth technology discussing the above problems.  An additional 10 minutes were spent reviewing the patient's chart and documenting today's encounter.   Medication Adjustments/Labs and Tests Ordered: Current medicines are reviewed at length with the patient today.  Concerns regarding medicines are outlined above.   Tests Ordered: Orders Placed This Encounter  Procedures  . Basic metabolic panel    Medication Changes: None.  Disposition:  Follow up in 4 month(s)  Signed, Nelva Bush, MD  04/26/2018 11:26 AM    [facility]

## 2018-05-17 ENCOUNTER — Other Ambulatory Visit: Payer: Self-pay | Admitting: Urology

## 2018-05-31 ENCOUNTER — Ambulatory Visit (INDEPENDENT_AMBULATORY_CARE_PROVIDER_SITE_OTHER): Payer: Medicare Other | Admitting: Family Medicine

## 2018-05-31 ENCOUNTER — Other Ambulatory Visit: Payer: Self-pay

## 2018-05-31 ENCOUNTER — Telehealth: Payer: Self-pay

## 2018-05-31 ENCOUNTER — Other Ambulatory Visit: Payer: Self-pay | Admitting: Internal Medicine

## 2018-05-31 ENCOUNTER — Encounter: Payer: Self-pay | Admitting: Family Medicine

## 2018-05-31 VITALS — BP 92/61 | HR 66 | Temp 97.5°F | Wt 206.0 lb

## 2018-05-31 DIAGNOSIS — M1990 Unspecified osteoarthritis, unspecified site: Secondary | ICD-10-CM | POA: Diagnosis not present

## 2018-05-31 DIAGNOSIS — E559 Vitamin D deficiency, unspecified: Secondary | ICD-10-CM

## 2018-05-31 DIAGNOSIS — I10 Essential (primary) hypertension: Secondary | ICD-10-CM | POA: Diagnosis not present

## 2018-05-31 DIAGNOSIS — E1121 Type 2 diabetes mellitus with diabetic nephropathy: Secondary | ICD-10-CM | POA: Diagnosis not present

## 2018-05-31 LAB — POCT GLYCOSYLATED HEMOGLOBIN (HGB A1C): Hemoglobin A1C: 7.1 % — AB (ref 4.0–5.6)

## 2018-05-31 MED ORDER — FUROSEMIDE 40 MG PO TABS: 40.0000 mg | ORAL_TABLET | Freq: Every day | ORAL | 0 refills | Status: DC

## 2018-05-31 MED ORDER — HYDROCODONE-ACETAMINOPHEN 10-325 MG PO TABS: 1.0000 | ORAL_TABLET | Freq: Three times a day (TID) | ORAL | 0 refills | Status: DC | PRN

## 2018-05-31 NOTE — Patient Instructions (Signed)
.   Please review the attached list of medications and notify my office if there are any errors.   . Please bring all of your medications to every appointment so we can make sure that our medication list is the same as yours.   

## 2018-05-31 NOTE — Telephone Encounter (Signed)
Requested Prescriptions   Pending Prescriptions Disp Refills  . furosemide (LASIX) 40 MG tablet 90 tablet 0    Sig: Take 1 tablet (40 mg total) by mouth daily.

## 2018-05-31 NOTE — Progress Notes (Addendum)
Patient: [name] Female    DOB: 1945/08/18   73 y.o.   MRN: [id number] Visit Date: 05/31/2018  Today's Provider: Lelon Huh, MD   Chief Complaint  Patient presents with  . Hypertension  . Hyperlipidemia  . Diabetes   Subjective:     HPI     Diabetes Mellitus Type II, Follow-up:   Lab Results  Component Value Date   HGBA1C 7.2 (A) 01/25/2018   HGBA1C 7.4 (A) 09/24/2017   HGBA1C 7.0 (A) 06/21/2017   Last seen for diabetes 5 months ago.  Management since then includes no changes. She reports excellent compliance with treatment. She is not having side effects.  Current symptoms include none and have been stable. Home blood sugar records: are not being checked  Episodes of hypoglycemia? no   Current Insulin Regimen: None Most Recent Eye Exam: Pt has an appointment in June Weight trend: stable Prior visit with dietician: no Current diet: in general, a "healthy" diet   Current exercise: none  ------------------------------------------------------------------------   Hypertension, follow-up:  BP Readings from Last 3 Encounters:  05/31/18 92/61  04/26/18 120/75  02/27/18 108/70    She was last seen for hypertension 5 months ago.  BP at that visit was 118/80. Management since that visit includes No changes She reports excellent compliance with treatment. She is not having side effects.  She is not exercising. She is not adherent to low salt diet.   Outside blood pressures are being checked.  Pt states her blood pressure has been low. She is experiencing none.  Patient denies chest pain, exertional chest pressure/discomfort, fatigue, lower extremity edema and palpitations.   Cardiovascular risk factors include advanced age (older than 68 for men, 42 for women), diabetes mellitus, hypertension, obesity (BMI >= 30 kg/m2) and sedentary lifestyle.  Use of agents associated with hypertension: none.    ------------------------------------------------------------------------    Lipid/Cholesterol, Follow-up:   Last seen for this 5 months ago.  Management since that visit includes No changes.  Last Lipid Panel:    Component Value Date/Time   CHOL 147 01/25/2018 1211   TRIG 97 01/25/2018 1211   HDL 52 01/25/2018 1211   CHOLHDL 2.8 01/25/2018 1211   LDLCALC 76 01/25/2018 1211    She reports excellent compliance with treatment. She is not having side effects.   Wt Readings from Last 3 Encounters:  05/31/18 206 lb (93.4 kg)  04/26/18 208 lb (94.3 kg)  02/27/18 206 lb (93.4 kg)    ------------------------------------------------------------------------  She continues regular follow up with Dr. Saunders Revel for CHF and reports she is having no trouble with her breathing. Still has swollen legs, but no more than usual. Has been seeing urology and prescribed Vesicare which she reports has helped with urine control significantly and has been able to take furosemide consistently.    Allergies  Allergen Reactions  . Hydrochlorothiazide     Leg Cramps  . Penicillins Rash     Current Outpatient Medications:  .  amLODipine (NORVASC) 5 MG tablet, Take 1 tablet (5 mg total) by mouth daily., Disp: 90 tablet, Rfl: 0 .  aspirin EC 81 MG tablet, Take 1 tablet (81 mg total) by mouth daily., Disp: 90 tablet, Rfl: 3 .  Cholecalciferol (VITAMIN D) 50 MCG (2000 UT) tablet, Take 2,000 Units by mouth daily., Disp: , Rfl:  .  cyanocobalamin 500 MCG tablet, Take 500 mcg by mouth daily., Disp: , Rfl:  .  HYDROcodone-acetaminophen (NORCO) 10-325 MG tablet, Take  1 tablet by mouth every 8 (eight) hours as needed., Disp: 90 tablet, Rfl: 0 .  lisinopril (PRINIVIL,ZESTRIL) 20 MG tablet, Take 20 mg by mouth daily., Disp: , Rfl:  .  lovastatin (MEVACOR) 20 MG tablet, Take 1 tablet (20 mg total) by mouth at bedtime., Disp: 90 tablet, Rfl: 4 .  meloxicam (MOBIC) 15 MG tablet, TAKE 1 TABLET (15 MG TOTAL) BY MOUTH  DAILY AS NEEDED FOR PAIN. TAKE WITH FOOD, Disp: 90 tablet, Rfl: 3 .  metFORMIN (GLUCOPHAGE-XR) 750 MG 24 hr tablet, TAKE 2 TABLETS (1,500 MG TOTAL) BY MOUTH DAILY., Disp: 180 tablet, Rfl: 3 .  metoprolol tartrate (LOPRESSOR) 50 MG tablet, TAKE 1 TABLET BY MOUTH TWICE A DAY, Disp: 180 tablet, Rfl: 1 .  solifenacin (VESICARE) 5 MG tablet, Take 1 tablet (5 mg total) by mouth daily., Disp: 30 tablet, Rfl: 11 .  spironolactone (ALDACTONE) 25 MG tablet, Take 1 tablet (25 mg total) by mouth daily., Disp: 90 tablet, Rfl: 3 .  traZODone (DESYREL) 100 MG tablet, TAKE 1 TABLET BY MOUTH EVERY NIGHT AT BEDTIME AS NEEDED FOR INSOMNIA, Disp: 90 tablet, Rfl: 4 .  furosemide (LASIX) 40 MG tablet, Take 1 tablet (40 mg total) by mouth daily., Disp: 90 tablet, Rfl: 3  Review of Systems  Constitutional: Negative for appetite change, chills, fatigue and fever.  Respiratory: Negative for chest tightness and shortness of breath.   Cardiovascular: Negative for chest pain and palpitations.  Gastrointestinal: Negative for abdominal pain, nausea and vomiting.  Neurological: Negative for dizziness and weakness.    Social History   Tobacco Use  . Smoking status: Never Smoker  . Smokeless tobacco: Never Used  Substance Use Topics  . Alcohol use: No      Objective:   BP 92/61 (BP Location: Right Arm, Patient Position: Sitting, Cuff Size: Large)   Pulse 66   Temp (!) 97.5 F (36.4 C) (Oral)   Wt 206 lb (93.4 kg)   BMI 35.36 kg/m  Vitals:   05/31/18 0952  BP: 92/61  Pulse: 66  Temp: (!) 97.5 F (36.4 C)  TempSrc: Oral  Weight: 206 lb (93.4 kg)     Physical Exam  General Appearance:    Alert, cooperative, no distress, obese  Eyes:    PERRL, conjunctiva/corneas clear, EOM's intact       Lungs:     Clear to auscultation bilaterally, respirations unlabored  Heart:    Regular rate and rhythm  Neurologic:   Awake, alert, oriented x 3. No apparent focal neurological           defect.   Ext:   3+ bilateral  lower leg edema. Is wearing compression stockings.    Results for orders placed or performed in visit on 05/31/18  POCT glycosylated hemoglobin (Hb A1C)  Result Value Ref Range   Hemoglobin A1C 7.1 (A) 4.0 - 5.6 %       Assessment & Plan    1. Diabetes mellitus with nephropathy ([place]) Well controlled. .cmc  - POCT glycosylated hemoglobin (Hb A1C)  2. Vitamin D deficiency Has been taking vitamin d supplements consistently.  - VITAMIN D 25 Hydroxy (Vit-D Deficiency, Fractures)  3. Essential hypertension Well controlled.  Continue current medications.   - Renal function panel  4. Osteoarthritis, unspecified osteoarthritis type, unspecified site Doing well with occasional - HYDROcodone-acetaminophen (NORCO) 10-325 MG tablet; Take 1 tablet by mouth every 8 (eight) hours as needed.  Dispense: 90 tablet; Refill: 0    The entirety of the  information documented in the History of Present Illness, Review of Systems and Physical Exam were personally obtained by me. Portions of this information were initially documented by Ashley Royalty, CMA and reviewed by me for thoroughness and accuracy.   Lelon Huh, MD  [facility]

## 2018-06-01 LAB — RENAL FUNCTION PANEL
Albumin: 3.9 g/dL (ref 3.7–4.7)
BUN/Creatinine Ratio: 22 (ref 12–28)
BUN: 37 mg/dL — ABNORMAL HIGH (ref 8–27)
CO2: 23 mmol/L (ref 20–29)
Calcium: 10.1 mg/dL (ref 8.7–10.3)
Chloride: 99 mmol/L (ref 96–106)
Creatinine, Ser: 1.69 mg/dL — ABNORMAL HIGH (ref 0.57–1.00)
GFR calc Af Amer: 34 mL/min/{1.73_m2} — ABNORMAL LOW (ref >59)
GFR calc non Af Amer: 29 mL/min/{1.73_m2} — ABNORMAL LOW (ref >59)
Glucose: 107 mg/dL — ABNORMAL HIGH (ref 65–99)
Phosphorus: 3.5 mg/dL (ref 3.0–4.3)
Potassium: 4.4 mmol/L (ref 3.5–5.2)
Sodium: 137 mmol/L (ref 134–144)

## 2018-06-01 LAB — VITAMIN D 25 HYDROXY (VIT D DEFICIENCY, FRACTURES): Vit D, 25-Hydroxy: 34.4 ng/mL (ref 30.0–100.0)

## 2018-06-03 ENCOUNTER — Telehealth: Payer: Self-pay

## 2018-06-03 NOTE — Telephone Encounter (Signed)
Notes recorded by Birdie Sons, MD on 06/03/2018 at 8:13 AM EDT Also, try to cut back on the meloxicam to just two or three days a week, it's not good for the kidneys. ------  Notes recorded by Nelva Bush, MD on 06/03/2018 at 6:52 AM EDT I agree with Dr. Caryn Section regarding increasing fluid intake. Repeat BMP in 2-3 weeks should be considered to ensure that renal function is stable/improved. We can arrange for this through our office or defer to Dr. Caryn Section. ------  Notes recorded by Birdie Sons, MD on 06/02/2018 at 9:10 AM EDT Vitamin d levels are much better. Continue current supplement. Kidney functions slightly worse. Try to drink a few more glasses of water a day. Otherwise labs stable. Have sent labs to Dr End to review.

## 2018-06-03 NOTE — Telephone Encounter (Signed)
Pt husband advised as directed below.  He will call back in a few weeks to get the labs repeated.   Thanks,   -Mickel Baas

## 2018-06-07 ENCOUNTER — Other Ambulatory Visit: Payer: Self-pay

## 2018-06-07 ENCOUNTER — Ambulatory Visit: Payer: Medicare Other | Admitting: Urology

## 2018-06-07 ENCOUNTER — Encounter: Payer: Self-pay | Admitting: Urology

## 2018-06-07 VITALS — BP 111/72 | HR 62 | Ht 64.0 in

## 2018-06-07 DIAGNOSIS — R3129 Other microscopic hematuria: Secondary | ICD-10-CM

## 2018-06-07 DIAGNOSIS — R3915 Urgency of urination: Secondary | ICD-10-CM

## 2018-06-07 LAB — MICROSCOPIC EXAMINATION: WBC, UA: >30 /hpf — AB (ref 0–5)

## 2018-06-07 LAB — URINALYSIS, COMPLETE
Bilirubin, UA: NEGATIVE
Glucose, UA: NEGATIVE
Ketones, UA: NEGATIVE
Nitrite, UA: NEGATIVE
Protein,UA: NEGATIVE
Specific Gravity, UA: 1.015 (ref 1.005–1.030)
Urobilinogen, Ur: 0.2 mg/dL (ref 0.2–1.0)
pH, UA: 5.5 (ref 5.0–7.5)

## 2018-06-07 MED ORDER — TOLTERODINE TARTRATE 2 MG PO TABS: 2.0000 mg | ORAL_TABLET | Freq: Two times a day (BID) | ORAL | 3 refills | Status: DC

## 2018-06-07 MED ORDER — DOXYCYCLINE HYCLATE 50 MG PO TABS: 1.0000 | ORAL_TABLET | Freq: Two times a day (BID) | ORAL | 0 refills | Status: DC

## 2018-06-07 NOTE — Progress Notes (Signed)
06/07/2018 1:49 PM   [name] 1945/05/05 [id number]  Referring provider: Birdie Sons, [place] [address] [place], [place] [address]  Chief Complaint  Patient presents with  . Cysto    HPI:  73 yo female for f/u of :   1) urgency and UUI. Noc x 3. She wears "a bunch" of pads per day. Maybe 10. No gross hematuria. She has knee pain and is in a wheelchair. She is sedentary and in a wheelchair. Stopping diuretics did not help. She voids about every 2 - 3 hours. She sleeps sitting up in a chair. NG risk includes DM. CKD with gfr ~ 40. No constipation. No dysuria. No pelvic surgery. No prolapse symptoms. PVR 86 ml. Symptoms no change p abx. She drinks juice and water. She drinks a lot of coffee in the morning. She tried solifenacin 5 mg and timed voiding.  She may have some memory issues.  Her husband said he definitely notices less leakage. She is getting up less at night.  Solifenacin working well but expensive.  2)MH - UA on 02/27/2018 contained 3-10 rbcs. Prior urine cultures grew aerococcus and e coli. A Mar 2018 CT / PET showed a normal GU tract done for MM. She underwent renal US 03/06/2018 which was benign.   She returns for the above for cystoscopy, medical management and review renal US.    PMH: Past Medical History:  Diagnosis Date  . Aortic stenosis   . Asthma   . Coronary artery disease   . Diabetes mellitus without complication ([place])   . History of measles   . Hypertension   . Nonischemic cardiomyopathy ([place])   . Pulmonary hypertension ([place])     Surgical History: Past Surgical History:  Procedure Laterality Date  . Cyst(solitary) of breast:removed    . RIGHT/LEFT HEART CATH AND CORONARY ANGIOGRAPHY N/A 03/07/2016   Procedure: Right/Left Heart Cath and Coronary Angiography;  Surgeon: Nelva Bush, MD;  Location: [facility];  Service: Cardiovascular;  Laterality: N/A;  . TUBAL LIGATION      Home Medications:  Allergies as of 06/07/2018       Reactions   Hydrochlorothiazide    Leg Cramps   Penicillins Rash      Medication List       Accurate as of June 07, 2018  1:49 PM. If you have any questions, ask your nurse or doctor.        amLODipine 5 MG tablet Commonly known as:  NORVASC Take 1 tablet (5 mg total) by mouth daily.   aspirin EC 81 MG tablet Take 1 tablet (81 mg total) by mouth daily.   furosemide 40 MG tablet Commonly known as:  LASIX Take 1 tablet (40 mg total) by mouth daily.   HYDROcodone-acetaminophen 10-325 MG tablet Commonly known as:  NORCO Take 1 tablet by mouth every 8 (eight) hours as needed.   lisinopril 20 MG tablet Commonly known as:  ZESTRIL Take 20 mg by mouth daily.   lovastatin 20 MG tablet Commonly known as:  MEVACOR Take 1 tablet (20 mg total) by mouth at bedtime.   meloxicam 15 MG tablet Commonly known as:  MOBIC TAKE 1 TABLET (15 MG TOTAL) BY MOUTH DAILY AS NEEDED FOR PAIN. TAKE WITH FOOD   metFORMIN 750 MG 24 hr tablet Commonly known as:  GLUCOPHAGE-XR TAKE 2 TABLETS (1,500 MG TOTAL) BY MOUTH DAILY.   metoprolol tartrate 50 MG tablet Commonly known as:  LOPRESSOR TAKE 1 TABLET  BY MOUTH TWICE A DAY   solifenacin 5 MG tablet Commonly known as:  VESICARE Take 1 tablet (5 mg total) by mouth daily.   spironolactone 25 MG tablet Commonly known as:  ALDACTONE Take 1 tablet (25 mg total) by mouth daily.   traZODone 100 MG tablet Commonly known as:  DESYREL TAKE 1 TABLET BY MOUTH EVERY NIGHT AT BEDTIME AS NEEDED FOR INSOMNIA   vitamin B-12 500 MCG tablet Commonly known as:  CYANOCOBALAMIN Take 500 mcg by mouth daily.   Vitamin D 50 MCG (2000 UT) tablet Take 2,000 Units by mouth daily.       Allergies:  Allergies  Allergen Reactions  . Hydrochlorothiazide     Leg Cramps  . Penicillins Rash    Family History: Family History  Problem Relation Age of Onset  . Cancer Mother        Uterine cancer  . Diabetes Mother   . Heart disease Mother   . Heart  failure Mother   . Parkinson's disease Father   . Cancer Sister        breast cancer  . Diabetes Brother        Non-insulin Dependent Diabetes Mellitus  . Mesothelioma Brother   . Hypertension Sister   . Diabetes Sister        Non-insulin dependent Diabetes Mellitus    Social History:  reports that she has never smoked. She has never used smokeless tobacco. She reports that she does not drink alcohol or use drugs.  ROS:                                          06/07/18  CC:  Chief Complaint  Patient presents with  . Cysto    HPI:  Blood pressure 111/72, pulse 62, height 5\' 4"  (1.626 m). NED. A&Ox3.   No respiratory distress   Abd soft, NT, ND Normal external genitalia with patent urethral meatus, urethra and bladder palpably normal. Good support. No prolapse. Vulva appeared normal without lesion. Moderate atrophy.   Cystoscopy Procedure Note  Patient identification was confirmed, informed consent was obtained, and patient was prepped using Betadine solution.  Lidocaine jelly was administered per urethral meatus.    Procedure: - Flexible cystoscope introduced, without any difficulty.   - Thorough search of the bladder revealed:    normal urethral meatus    There was an erythematous ulcerated area right bladder wall, laterally     no stones    no ulcers     no tumors    no urethral polyps    no trabeculation Very cloudy urine with a lot of debris   - Ureteral orifices were normal in position and appearance.  Post-Procedure: - Patient tolerated the procedure well  Morey Hummingbird was chaperone. Urine was aspirated and sent for urine culture    Laboratory Data: Lab Results  Component Value Date   WBC 9.6 01/25/2018   HGB 9.6 (L) 01/25/2018   HCT 29.6 (L) 01/25/2018   MCV 90 01/25/2018   PLT 265 01/25/2018    Lab Results  Component Value Date   CREATININE 1.69 (H) 05/31/2018    No results found for: PSA  No results found for:  TESTOSTERONE  Lab Results  Component Value Date   HGBA1C 7.1 (A) 05/31/2018    Urinalysis    Component Value Date/Time   APPEARANCEUR Clear 02/27/2018 7353  GLUCOSEU Negative 02/27/2018 0821   BILIRUBINUR Negative 02/27/2018 0821   KETONESUR negative 09/21/2016 1249   PROTEINUR 1+ (A) 02/27/2018 0821   UROBILINOGEN 0.2 01/25/2018 1143   NITRITE Negative 02/27/2018 0821   LEUKOCYTESUR 1+ (A) 02/27/2018 0821    Lab Results  Component Value Date   LABMICR See below: 02/27/2018   WBCUA 11-30 (A) 02/27/2018   RBCUA 3-10 (A) 02/27/2018   LABEPIT 0-10 02/27/2018   BACTERIA Moderate (A) 02/27/2018    Pertinent Imaging: [place] No results found for this or any previous visit. No results found for this or any previous visit. No results found for this or any previous visit. No results found for this or any previous visit. Results for orders placed during the hospital encounter of 03/06/18  US RENAL   Narrative CLINICAL DATA:  Microhematuria  EXAM: RENAL / URINARY TRACT ULTRASOUND COMPLETE  COMPARISON:  03/31/2016  FINDINGS: Right Kidney:  Renal measurements: 10.3 x 5.6 x 4.1 cm. = volume: 122 mL. Mild cortical thinning is noted. A 9 mm hypoechoic structure is noted within the midportion of the right kidney consistent with small cyst. No obstructive changes are seen. No renal calculi are noted.  Left Kidney:  Renal measurements: 10.1 x 5.4 x 4.6 cm = volume: 132 mL. Mild cortical thinning is noted. No mass lesion or hydronephrosis is noted.  Bladder:  Appears normal for degree of bladder distention.  IMPRESSION: Mild cortical thinning bilaterally.  Tiny right renal cyst.   Electronically Signed   By: Inez Catalina M.D.   On: 03/07/2018 08:31    No results found for this or any previous visit. No results found for this or any previous visit. No results found for this or any previous visit.  Assessment & Plan:    1. Microhematuria  Return for 6-8 weeks for  repeat cystoscopy .  She had an ulcerative red area right lateral bladder.  Again I put her on doycycline 50 mg twice a day for 5 days and then nightly.  Urine sent for culture.  2.  Urgency urge incontinence-improved.  We will try tolterodine twice a day and see if that is less expensive.  - Urinalysis, Complete   No follow-ups on file.  Festus Aloe, MD  [facility] [address] [place], [place] [address] [phone]

## 2018-06-10 LAB — CULTURE, URINE COMPREHENSIVE

## 2018-06-12 ENCOUNTER — Other Ambulatory Visit: Payer: Self-pay | Admitting: Internal Medicine

## 2018-06-16 ENCOUNTER — Other Ambulatory Visit: Payer: Self-pay | Admitting: Family Medicine

## 2018-06-27 DIAGNOSIS — H2513 Age-related nuclear cataract, bilateral: Secondary | ICD-10-CM | POA: Diagnosis not present

## 2018-06-27 DIAGNOSIS — E113393 Type 2 diabetes mellitus with moderate nonproliferative diabetic retinopathy without macular edema, bilateral: Secondary | ICD-10-CM | POA: Diagnosis not present

## 2018-06-27 LAB — HM DIABETES EYE EXAM

## 2018-06-28 ENCOUNTER — Encounter: Payer: Self-pay | Admitting: Family Medicine

## 2018-07-14 ENCOUNTER — Other Ambulatory Visit: Payer: Self-pay | Admitting: Family Medicine

## 2018-07-14 DIAGNOSIS — G4700 Insomnia, unspecified: Secondary | ICD-10-CM

## 2018-07-31 ENCOUNTER — Other Ambulatory Visit: Payer: Self-pay

## 2018-07-31 ENCOUNTER — Encounter: Payer: Self-pay | Admitting: Urology

## 2018-07-31 ENCOUNTER — Ambulatory Visit (INDEPENDENT_AMBULATORY_CARE_PROVIDER_SITE_OTHER): Payer: Medicare Other | Admitting: Urology

## 2018-07-31 VITALS — BP 93/60 | HR 70

## 2018-07-31 DIAGNOSIS — R3129 Other microscopic hematuria: Secondary | ICD-10-CM

## 2018-07-31 LAB — MICROSCOPIC EXAMINATION

## 2018-07-31 LAB — URINALYSIS, COMPLETE
Bilirubin, UA: NEGATIVE
Glucose, UA: NEGATIVE
Ketones, UA: NEGATIVE
Nitrite, UA: NEGATIVE
Protein,UA: NEGATIVE
Specific Gravity, UA: 1.02 (ref 1.005–1.030)
Urobilinogen, Ur: 0.2 mg/dL (ref 0.2–1.0)
pH, UA: 6 (ref 5.0–7.5)

## 2018-07-31 MED ORDER — LIDOCAINE HCL URETHRAL/MUCOSAL 2 % EX GEL
1.0000 "application " | Freq: Once | CUTANEOUS | Status: AC
  Administered 2018-07-31: 1 via URETHRAL

## 2018-07-31 NOTE — Patient Instructions (Signed)

## 2018-07-31 NOTE — Progress Notes (Signed)
   07/31/18  CC:  Chief Complaint  Patient presents with  . Cysto    HPI: [name] follows up for repeat cystoscopy and medication management for OAB.  She underwent cystoscopy for urgency and microscopic hematuria June 07, 2018.  She had an ulcerative erythematous area with cloudy urine on the right bladder wall.  She presents today for repeat cystoscopy.  I kept her on a nightly doxycycline.  Also, tolterodine twice daily is working well.  Her incontinence and urinary symptoms have improved.  Blood pressure 93/60, pulse 70. NED. A&Ox3.   No respiratory distress   Abd soft, NT, ND Normal external genitalia with patent urethral meatus  Cystoscopy Procedure Note  Patient identification was confirmed, informed consent was obtained, and patient was prepped using Betadine solution.  Lidocaine jelly was administered per urethral meatus.    Procedure: - Flexible cystoscope introduced, without any difficulty.   - Thorough search of the bladder revealed:    normal urethral meatus    normal urothelium    no stones    no ulcers     no tumors    no urethral polyps    no trabeculation  - Ureteral orifices were normal in position and appearance.  Post-Procedure: - Patient tolerated the procedure well -- Charlott Rakes was chaperone   Assessment/ Plan:  Urinary urgency-doing very well on tolterodine.  She can wean off doxycycline.  Bladder looked much better today normal.   No follow-ups on file.  Festus Aloe, MD

## 2018-08-29 ENCOUNTER — Other Ambulatory Visit: Payer: Self-pay | Admitting: *Deleted

## 2018-08-29 NOTE — Telephone Encounter (Signed)
Pt due for 12 month f/u. Please contact pt for future appointment. 

## 2018-09-02 ENCOUNTER — Other Ambulatory Visit: Payer: Self-pay | Admitting: Internal Medicine

## 2018-09-02 ENCOUNTER — Other Ambulatory Visit: Payer: Self-pay | Admitting: *Deleted

## 2018-09-02 MED ORDER — TOLTERODINE TARTRATE 2 MG PO TABS: 2.0000 mg | ORAL_TABLET | Freq: Two times a day (BID) | ORAL | 3 refills | Status: DC

## 2018-09-03 ENCOUNTER — Other Ambulatory Visit: Payer: Self-pay | Admitting: *Deleted

## 2018-09-03 MED ORDER — TOLTERODINE TARTRATE 2 MG PO TABS: 2.0000 mg | ORAL_TABLET | Freq: Two times a day (BID) | ORAL | 3 refills | Status: DC

## 2018-09-04 NOTE — Telephone Encounter (Signed)
Scheduled

## 2018-09-07 ENCOUNTER — Other Ambulatory Visit: Payer: Self-pay | Admitting: Internal Medicine

## 2018-09-11 ENCOUNTER — Telehealth: Payer: Self-pay

## 2018-09-11 NOTE — Telephone Encounter (Signed)
Insurance states Tolterodine Tartrate 2mg  not covered by plan and would like an alternative sent in  Spoke with patient's husband per DPR he states the medication is working well for her so he went ahead and paid out of pocket. He was informed that he could contact the plan to find out which medication would be preferred under her covered medications if this becomes to expensive. He states he will call back if so

## 2018-09-16 ENCOUNTER — Other Ambulatory Visit: Payer: Self-pay

## 2018-09-16 MED ORDER — LISINOPRIL 20 MG PO TABS: 20.0000 mg | ORAL_TABLET | Freq: Every day | ORAL | 0 refills | Status: DC

## 2018-09-19 ENCOUNTER — Telehealth: Payer: Self-pay

## 2018-09-19 MED ORDER — LISINOPRIL 20 MG PO TABS: 20.0000 mg | ORAL_TABLET | Freq: Every day | ORAL | 0 refills | Status: DC

## 2018-09-19 NOTE — Telephone Encounter (Signed)
Requested Prescriptions   Signed Prescriptions Disp Refills  . lisinopril (ZESTRIL) 20 MG tablet 30 tablet 0    Sig: Take 1 tablet (20 mg total) by mouth daily. NEEDS OFFICE VISIT-PLEASE CALL [phone] TO SCHEDULE. THANK YOU!    Authorizing Provider: END, CHRISTOPHER    Ordering User: Raelene Bott, Leiani Enright L

## 2018-09-25 NOTE — Progress Notes (Signed)
Subjective:   [name] is a 73 y.o. female who presents for Medicare Annual (Subsequent) preventive examination.    This visit is being conducted through telemedicine due to the COVID-19 pandemic. This patient has given me verbal consent via doximity to conduct this visit, patient states they are participating from their home address. Some vital signs may be absent or patient reported.    Patient identification: identified by name, DOB, and current address  Review of Systems:  N/A  Cardiac Risk Factors include: advanced age (>21men, >19 women);diabetes mellitus;dyslipidemia;hypertension     Objective:     Vitals: There were no vitals taken for this visit.  There is no height or weight on file to calculate BMI. Unable to obtain vitals due to visit being conducted via telephonically.   Advanced Directives 09/26/2018 09/24/2017 08/28/2017 05/29/2017 12/28/2016 11/14/2016 09/21/2016  Does Patient Have a Medical Advance Directive? Yes Yes No No No No No  Type of Paramedic of [place];Living will [place];Living will - - - - -  Copy of [place] in Chart? No - copy requested No - copy requested - - - - -  Would patient like information on creating a medical advance directive? - - No - Patient declined - Yes (MAU/Ambulatory/Procedural Areas - Information given) - Yes (ED - Information included in AVS)    Tobacco Social History   Tobacco Use  Smoking Status Never Smoker  Smokeless Tobacco Never Used     Counseling given: Not Answered   Clinical Intake:  Pre-visit preparation completed: Yes  Pain : No/denies pain Pain Score: 0-No pain     Nutritional Risks: None Diabetes: Yes  How often do you need to have someone help you when you read instructions, pamphlets, or other written materials from your doctor or pharmacy?: 1 - Never   Diabetes:  Is the patient diabetic?  Yes type 2 If diabetic, was a CBG  obtained today?  No  Did the patient bring in their glucometer from home?  No  How often do you monitor your CBG's? Does not check BS at home.   Financial Strains and Diabetes Management:  Are you having any financial strains with the device, your supplies or your medication? No .  Does the patient want to be seen by Chronic Care Management for management of their diabetes?  No  Would the patient like to be referred to a Nutritionist or for Diabetic Management?  No   Diabetic Exams:  Diabetic Eye Exam: Completed 06/27/18. Repeat yearly.  Diabetic Foot Exam: Currently due. Pt has been advised about the importance in completing this exam. Note made to follow up on this at [place] in office visit.    Interpreter Needed?: No  Information entered by :: [facility], LPN  Past Medical History:  Diagnosis Date  . Aortic stenosis   . Asthma   . Coronary artery disease   . Diabetes mellitus without complication ([place])   . History of measles   . Hypertension   . Nonischemic cardiomyopathy ([place])   . Pulmonary hypertension ([place])    Past Surgical History:  Procedure Laterality Date  . Cyst(solitary) of breast:removed    . RIGHT/LEFT HEART CATH AND CORONARY ANGIOGRAPHY N/A 03/07/2016   Procedure: Right/Left Heart Cath and Coronary Angiography;  Surgeon: Nelva Bush, MD;  Location: [facility];  Service: Cardiovascular;  Laterality: N/A;  . TUBAL LIGATION     Family History  Problem Relation Age of  Onset  . Cancer Mother        Uterine cancer  . Diabetes Mother   . Heart disease Mother   . Heart failure Mother   . Parkinson's disease Father   . Cancer Sister        breast cancer  . Diabetes Brother        Non-insulin Dependent Diabetes Mellitus  . Mesothelioma Brother   . Hypertension Sister   . Diabetes Sister        Non-insulin dependent Diabetes Mellitus   Social History   Socioeconomic History  . Marital status: Married    Spouse name: Not on file  . Number  of children: 2  . Years of education: Not on file  . Highest education level: High school graduate  Occupational History  . Occupation: retired  Scientific laboratory technician  . Financial resource strain: Not hard at all  . Food insecurity    Worry: Never true    Inability: Never true  . Transportation needs    Medical: No    Non-medical: No  Tobacco Use  . Smoking status: Never Smoker  . Smokeless tobacco: Never Used  Substance and Sexual Activity  . Alcohol use: No  . Drug use: No  . Sexual activity: Yes    Birth control/protection: None  Lifestyle  . Physical activity    Days per week: 0 days    Minutes per session: 0 min  . Stress: Not at all  Relationships  . Social Herbalist on phone: Patient refused    Gets together: Patient refused    Attends religious service: Patient refused    Active member of club or organization: Patient refused    Attends meetings of clubs or organizations: Patient refused    Relationship status: Patient refused  Other Topics Concern  . Not on file  Social History Narrative  . Not on file    Outpatient Encounter Medications as of 09/26/2018  Medication Sig  . amLODipine (NORVASC) 5 MG tablet TAKE 1 TABLET BY MOUTH EVERY DAY  . aspirin EC 81 MG tablet Take 1 tablet (81 mg total) by mouth daily.  . Cholecalciferol (VITAMIN D) 50 MCG (2000 UT) tablet Take 2,000 Units by mouth daily.  . cyanocobalamin 500 MCG tablet Take 500 mcg by mouth daily.  . furosemide (LASIX) 40 MG tablet TAKE 1 TABLET BY MOUTH EVERY DAY  . HYDROcodone-acetaminophen (NORCO) 10-325 MG tablet Take 1 tablet by mouth every 8 (eight) hours as needed.  Marland Kitchen lisinopril (ZESTRIL) 20 MG tablet Take 1 tablet (20 mg total) by mouth daily. NEEDS OFFICE VISIT-PLEASE CALL [phone] TO SCHEDULE. THANK YOU!  . lovastatin (MEVACOR) 20 MG tablet Take 1 tablet (20 mg total) by mouth at bedtime.  . meloxicam (MOBIC) 15 MG tablet TAKE 1 TABLET (15 MG TOTAL) BY MOUTH DAILY AS NEEDED FOR PAIN.  TAKE WITH FOOD  . metFORMIN (GLUCOPHAGE-XR) 750 MG 24 hr tablet TAKE 2 TABLETS (1,500 MG TOTAL) BY MOUTH DAILY. (Patient taking differently: Take 1,500 mg by mouth 2 (two) times daily. )  . metoprolol tartrate (LOPRESSOR) 50 MG tablet TAKE 1 TABLET BY MOUTH TWICE A DAY  . spironolactone (ALDACTONE) 25 MG tablet Take 1 tablet (25 mg total) by mouth daily.  Marland Kitchen tolterodine (DETROL) 2 MG tablet Take 1 tablet (2 mg total) by mouth 2 (two) times daily.  . traZODone (DESYREL) 100 MG tablet TAKE 1 TABLET BY MOUTH EVERY NIGHT AT BEDTIME AS NEEDED FOR INSOMNIA  .  Doxycycline Hyclate 50 MG TABS Take 1 tablet by mouth 2 (two) times a day. Take one tablet po BID for FIVE days and then one tablet PO QHS (Patient not taking: Reported on 09/26/2018)   No facility-administered encounter medications on file as of 09/26/2018.     Activities of Daily Living In your present state of health, do you have any difficulty performing the following activities: 09/26/2018  Hearing? N  Vision? N  Difficulty concentrating or making decisions? Y  Comment Has some memory loss.  Walking or climbing stairs? Y  Comment Unable to lift legs up to climb stairs.  Dressing or bathing? Y  Comment Has assistance getting in and out of shower.  Doing errands, shopping? Y  Comment Does not drive.  Preparing Food and eating ? N  Using the Toilet? N  In the past six months, have you accidently leaked urine? Y  Comment Is incontinent.  Do you have problems with loss of bowel control? N  Managing your Medications? Y  Comment Husband manages medications.  Managing your Finances? Y  Comment Husband manages finances.  Housekeeping or managing your Housekeeping? N  Some recent data might be hidden    Patient Care Team: Birdie Sons, MD as PCP - General (Family Medicine) End, Harrell Gave, MD as PCP - Cardiology (Cardiology) Eulogio Bear, MD as Consulting Physician (Ophthalmology) Festus Aloe, MD as Consulting Physician  (Urology)    Assessment:   This is a routine wellness examination for [name].  Exercise Activities and Dietary recommendations Current Exercise Habits: The patient does not participate in regular exercise at present, Exercise limited by: None identified  Goals    . DIET - REDUCE SUGAR INTAKE     Recommend to cut out junk foods and sweets in daily diet to help aid in weight loss.     . Fall Prevention     Recommend removing all area rugs from the home to prevent falls.        Fall Risk: Fall Risk  09/26/2018 09/24/2017 09/21/2016 09/08/2015 07/01/2014  Falls in the past year? 1 No Yes Yes Yes  Number falls in past yr: 1 - 2 or more 2 or more 1  Injury with Fall? 0 - No No No  Follow up Falls prevention discussed - Falls prevention discussed - Falls evaluation completed    FALL RISK PREVENTION PERTAINING TO THE HOME:  Any stairs in or around the home? Yes  If so, are there any without handrails? No   Home free of loose throw rugs in walkways, pet beds, electrical cords, etc? Yes  Adequate lighting in your home to reduce risk of falls? Yes   ASSISTIVE DEVICES UTILIZED TO PREVENT FALLS:  Life alert? No  Use of a cane, walker or w/c? Yes  Grab bars in the bathroom? Yes  Shower chair or bench in shower? Yes  Elevated toilet seat or a handicapped toilet? Yes   TIMED UP AND GO:  Was the test performed? No .    Depression Screen PHQ 2/9 Scores 09/26/2018 09/24/2017 09/21/2016 09/08/2015  PHQ - 2 Score 0 0 2 0  PHQ- 9 Score - - 7 -     Cognitive Function: Unable to complete due to completing AWV with husband.        Immunization History  Administered Date(s) Administered  . Influenza, High Dose Seasonal PF 12/02/2014, 09/08/2015, 09/21/2016, 09/24/2017  . Pneumococcal Conjugate-13 05/09/2013  . Pneumococcal Polysaccharide-23 01/19/2011    Qualifies for Shingles Vaccine?  Yes  . Due for Shingrix. Pt has been advised to call insurance company to determine out of pocket  expense. Advised may also receive vaccine at local pharmacy or Health Dept. Verbalized acceptance and understanding.  Tdap: Although this vaccine is not a covered service during a Wellness Exam, does the patient still wish to receive this vaccine today?  No .   Flu Vaccine: Due for Flu vaccine. Does the patient want to receive this vaccine today?  No .   Pneumococcal Vaccine: Completed series  Screening Tests Health Maintenance  Topic Date Due  . FOOT EXAM  05/12/1955  . MAMMOGRAM  05/18/2018  . INFLUENZA VACCINE  08/03/2018  . TETANUS/TDAP  09/03/2026 (Originally 05/12/1964)  . HEMOGLOBIN A1C  12/01/2018  . OPHTHALMOLOGY EXAM  06/27/2019  . DEXA SCAN  07/10/2019  . COLONOSCOPY  07/10/2022  . Hepatitis C Screening  Completed  . PNA vac Low Risk Adult  Completed    Cancer Screenings:  Colorectal Screening: Completed 07/09/12. Repeat every 10 years.  Mammogram: Completed 05/17/16. Ordered today. Pt provided with contact info and advised to call to schedule appt. Pt aware the office will call re: appt.  Bone Density: Completed 07/13/14. Results reflect OSTEOPENIA. Repeat every 5 years.   Lung Cancer Screening: (Low Dose CT Chest recommended if Age 34-80 years, 30 pack-year currently smoking OR have quit w/in 15years.) does not qualify.   Additional Screening:  Hepatitis C Screening: Up to date  Dental Screening: Recommended annual dental exams for proper oral hygiene   Community Resource Referral:  CRR required this visit?  No       Plan:  I have personally reviewed and addressed the Medicare Annual Wellness questionnaire and have noted the following in the patient's chart:  A. Medical and social history B. Use of alcohol, tobacco or illicit drugs  C. Current medications and supplements D. Functional ability and status E.  Nutritional status F.  Physical activity G. Advance directives H. List of other physicians I.  Hospitalizations, surgeries, and ER visits in previous  12 months J.  [place] such as hearing and vision if needed, cognitive and depression L. Referrals and appointments   In addition, I have reviewed and discussed with patient certain preventive protocols, quality metrics, and best practice recommendations. A written personalized care plan for preventive services as well as general preventive health recommendations were provided to patient.   Glendora Score, [place]  [id number] Nurse Health Advisor   Nurse Notes: Pt needs a diabetic foot exam and influenza vaccine at tomorrows in office visit.

## 2018-09-26 ENCOUNTER — Ambulatory Visit (INDEPENDENT_AMBULATORY_CARE_PROVIDER_SITE_OTHER): Payer: Medicare Other

## 2018-09-26 ENCOUNTER — Other Ambulatory Visit: Payer: Self-pay

## 2018-09-26 DIAGNOSIS — Z1239 Encounter for other screening for malignant neoplasm of breast: Secondary | ICD-10-CM | POA: Diagnosis not present

## 2018-09-26 DIAGNOSIS — Z0000 Encounter for general adult medical examination without abnormal findings: Secondary | ICD-10-CM

## 2018-09-26 NOTE — Patient Instructions (Signed)
[name] , Thank you for taking time to come for your Medicare Wellness Visit. I appreciate your ongoing commitment to your health goals. Please review the following plan we discussed and let me know if I can assist you in the future.   Screening recommendations/referrals: Colonoscopy: Up to date, due 07/2022 Mammogram: Ordered today. Pt aware the office will call re: appt. Bone Density: Up to date, due 07/2019 Recommended yearly ophthalmology/optometry visit for glaucoma screening and checkup Recommended yearly dental visit for hygiene and checkup  Vaccinations: Influenza vaccine: Currently due Pneumococcal vaccine: Completed series Tdap vaccine: Pt declines today.  Shingles vaccine: Pt declines today.     Advanced directives: Please bring a copy of your POA (Power of Attorney) and/or Living Will to your next appointment.   Conditions/risks identified: Fall risk prevention discussed. Recommend to continue cutting back on junk foods and sweets to help aid in weight loss.   Next appointment: 09/27/18 @ 10:00 AM with Dr Caryn Section.    Preventive Care 74 Years and Older, Female Preventive care refers to lifestyle choices and visits with your health care provider that can promote health and wellness. What does preventive care include?  A yearly physical exam. This is also called an annual well check.  Dental exams once or twice a year.  Routine eye exams. Ask your health care provider how often you should have your eyes checked.  Personal lifestyle choices, including:  Daily care of your teeth and gums.  Regular physical activity.  Eating a healthy diet.  Avoiding tobacco and drug use.  Limiting alcohol use.  Practicing safe sex.  Taking low-dose aspirin every day.  Taking vitamin and mineral supplements as recommended by your health care provider. What happens during an annual well check? The services and screenings done by your health care provider during your annual well  check will depend on your age, overall health, lifestyle risk factors, and family history of disease. Counseling  Your health care provider may ask you questions about your:  Alcohol use.  Tobacco use.  Drug use.  Emotional well-being.  Home and relationship well-being.  Sexual activity.  Eating habits.  History of falls.  Memory and ability to understand (cognition).  Work and work Statistician.  Reproductive health. Screening  You may have the following tests or measurements:  Height, weight, and BMI.  Blood pressure.  Lipid and cholesterol levels. These may be checked every 5 years, or more frequently if you are over 45 years old.  Skin check.  Lung cancer screening. You may have this screening every year starting at age 74 if you have a 30-pack-year history of smoking and currently smoke or have quit within the past 15 years.  Fecal occult blood test (FOBT) of the stool. You may have this test every year starting at age 74.  Flexible sigmoidoscopy or colonoscopy. You may have a sigmoidoscopy every 5 years or a colonoscopy every 10 years starting at age 74.  Hepatitis C blood test.  Hepatitis B blood test.  Sexually transmitted disease (STD) testing.  Diabetes screening. This is done by checking your blood sugar (glucose) after you have not eaten for a while (fasting). You may have this done every 1-3 years.  Bone density scan. This is done to screen for osteoporosis. You may have this done starting at age 74.  Mammogram. This may be done every 1-2 years. Talk to your health care provider about how often you should have regular mammograms. Talk with your health care provider  about your test results, treatment options, and if necessary, the need for more tests. Vaccines  Your health care provider may recommend certain vaccines, such as:  Influenza vaccine. This is recommended every year.  Tetanus, diphtheria, and acellular pertussis (Tdap, Td) vaccine. You  may need a Td booster every 10 years.  Zoster vaccine. You may need this after age 40.  Pneumococcal 13-valent conjugate (PCV13) vaccine. One dose is recommended after age 65.  Pneumococcal polysaccharide (PPSV23) vaccine. One dose is recommended after age 74. Talk to your health care provider about which screenings and vaccines you need and how often you need them. This information is not intended to replace advice given to you by your health care provider. Make sure you discuss any questions you have with your health care provider. Document Released: 01/15/2015 Document Revised: 09/08/2015 Document Reviewed: 10/20/2014 Elsevier Interactive Patient Education  2017 [place] Prevention in the Home Falls can cause injuries. They can happen to people of all ages. There are many things you can do to make your home safe and to help prevent falls. What can I do on the outside of my home?  Regularly fix the edges of walkways and driveways and fix any cracks.  Remove anything that might make you trip as you walk through a door, such as a raised step or threshold.  Trim any bushes or trees on the path to your home.  Use bright outdoor lighting.  Clear any walking paths of anything that might make someone trip, such as rocks or tools.  Regularly check to see if handrails are loose or broken. Make sure that both sides of any steps have handrails.  Any raised decks and porches should have guardrails on the edges.  Have any leaves, snow, or ice cleared regularly.  Use sand or salt on walking paths during winter.  Clean up any spills in your garage right away. This includes oil or grease spills. What can I do in the bathroom?  Use night lights.  Install grab bars by the toilet and in the tub and shower. Do not use towel bars as grab bars.  Use non-skid mats or decals in the tub or shower.  If you need to sit down in the shower, use a plastic, non-slip stool.  Keep the floor  dry. Clean up any water that spills on the floor as soon as it happens.  Remove soap buildup in the tub or shower regularly.  Attach bath mats securely with double-sided non-slip rug tape.  Do not have throw rugs and other things on the floor that can make you trip. What can I do in the bedroom?  Use night lights.  Make sure that you have a light by your bed that is easy to reach.  Do not use any sheets or blankets that are too big for your bed. They should not hang down onto the floor.  Have a firm chair that has side arms. You can use this for support while you get dressed.  Do not have throw rugs and other things on the floor that can make you trip. What can I do in the kitchen?  Clean up any spills right away.  Avoid walking on wet floors.  Keep items that you use a lot in easy-to-reach places.  If you need to reach something above you, use a strong step stool that has a grab bar.  Keep electrical cords out of the way.  Do not use floor polish or  wax that makes floors slippery. If you must use wax, use non-skid floor wax.  Do not have throw rugs and other things on the floor that can make you trip. What can I do with my stairs?  Do not leave any items on the stairs.  Make sure that there are handrails on both sides of the stairs and use them. Fix handrails that are broken or loose. Make sure that handrails are as long as the stairways.  Check any carpeting to make sure that it is firmly attached to the stairs. Fix any carpet that is loose or worn.  Avoid having throw rugs at the top or bottom of the stairs. If you do have throw rugs, attach them to the floor with carpet tape.  Make sure that you have a light switch at the top of the stairs and the bottom of the stairs. If you do not have them, ask someone to add them for you. What else can I do to help prevent falls?  Wear shoes that:  Do not have high heels.  Have rubber bottoms.  Are comfortable and fit you  well.  Are closed at the toe. Do not wear sandals.  If you use a stepladder:  Make sure that it is fully opened. Do not climb a closed stepladder.  Make sure that both sides of the stepladder are locked into place.  Ask someone to hold it for you, if possible.  Clearly mark and make sure that you can see:  Any grab bars or handrails.  First and last steps.  Where the edge of each step is.  Use tools that help you move around (mobility aids) if they are needed. These include:  Canes.  Walkers.  Scooters.  Crutches.  Turn on the lights when you go into a dark area. Replace any light bulbs as soon as they burn out.  Set up your furniture so you have a clear path. Avoid moving your furniture around.  If any of your floors are uneven, fix them.  If there are any pets around you, be aware of where they are.  Review your medicines with your doctor. Some medicines can make you feel dizzy. This can increase your chance of falling. Ask your doctor what other things that you can do to help prevent falls. This information is not intended to replace advice given to you by your health care provider. Make sure you discuss any questions you have with your health care provider. Document Released: 10/15/2008 Document Revised: 05/27/2015 Document Reviewed: 01/23/2014 Elsevier Interactive Patient Education  2017 [employer].

## 2018-09-27 ENCOUNTER — Encounter: Payer: Self-pay | Admitting: Family Medicine

## 2018-09-27 ENCOUNTER — Ambulatory Visit (INDEPENDENT_AMBULATORY_CARE_PROVIDER_SITE_OTHER): Payer: Medicare Other | Admitting: Family Medicine

## 2018-09-27 ENCOUNTER — Other Ambulatory Visit: Payer: Self-pay

## 2018-09-27 ENCOUNTER — Ambulatory Visit: Payer: Self-pay

## 2018-09-27 VITALS — BP 110/62 | HR 52 | Temp 96.6°F | Resp 16 | Ht 64.0 in | Wt 189.0 lb

## 2018-09-27 DIAGNOSIS — E1121 Type 2 diabetes mellitus with diabetic nephropathy: Secondary | ICD-10-CM | POA: Diagnosis not present

## 2018-09-27 DIAGNOSIS — Z6841 Body Mass Index (BMI) 40.0 and over, adult: Secondary | ICD-10-CM

## 2018-09-27 DIAGNOSIS — Z0000 Encounter for general adult medical examination without abnormal findings: Secondary | ICD-10-CM | POA: Diagnosis not present

## 2018-09-27 DIAGNOSIS — E7800 Pure hypercholesterolemia, unspecified: Secondary | ICD-10-CM

## 2018-09-27 DIAGNOSIS — I10 Essential (primary) hypertension: Secondary | ICD-10-CM

## 2018-09-27 DIAGNOSIS — E113419 Type 2 diabetes mellitus with severe nonproliferative diabetic retinopathy with macular edema, unspecified eye: Secondary | ICD-10-CM

## 2018-09-27 DIAGNOSIS — E538 Deficiency of other specified B group vitamins: Secondary | ICD-10-CM

## 2018-09-27 DIAGNOSIS — Z23 Encounter for immunization: Secondary | ICD-10-CM

## 2018-09-27 DIAGNOSIS — E559 Vitamin D deficiency, unspecified: Secondary | ICD-10-CM | POA: Diagnosis not present

## 2018-09-27 DIAGNOSIS — Z9181 History of falling: Secondary | ICD-10-CM

## 2018-09-27 DIAGNOSIS — C9000 Multiple myeloma not having achieved remission: Secondary | ICD-10-CM | POA: Diagnosis not present

## 2018-09-27 NOTE — Progress Notes (Signed)
Patient: [name], Female    DOB: 1945/05/10, 72 y.o.   MRN: [id number] Visit Date: 09/27/2018  Today's Provider: Lelon Huh, MD   Chief Complaint  Patient presents with  . Annual Exam  . Hypothyroidism  . Hypertension  . Hyperlipidemia  . Diabetes   Subjective:     Complete Physical [name] is a 73 y.o. female. She feels fairly well. She reports no regular exercising. She reports she is sleeping fairly well.  -----------------------------------------------------------  Follow up for Vitamin D Deficiency:  The patient was last seen for this 4 months ago. Changes made at last visit include none.  She reports good compliance with treatment. She feels that condition is Unchanged. She is not having side effects.   ------------------------------------------------------------------------------------  Hypertension, follow-up:  BP Readings from Last 3 Encounters:  09/27/18 110/62  07/31/18 93/60  06/07/18 111/72    She was last seen for hypertension 4 months ago.  BP at that visit was 92/61. Management since that visit includes ordering labs which showed decreased Kidney functions. Patient was advised to increase fluid intake and cut back on Meloxicam. She reports good compliance with treatment. She is not having side effects.  She is not exercising. She is not adherent to low salt diet.   Outside blood pressures are checked occasionally. She is experiencing none.  Patient denies chest pain, chest pressure/discomfort, claudication, dyspnea, exertional chest pressure/discomfort, fatigue, irregular heart beat, lower extremity edema, near-syncope, orthopnea, palpitations, paroxysmal nocturnal dyspnea, syncope and tachypnea.   Cardiovascular risk factors include advanced age (older than 13 for men, 23 for women), diabetes mellitus and hypertension.  Use of agents associated with hypertension: NSAIDS.     Weight trend: decreasing steadily Wt Readings  from Last 3 Encounters:  09/27/18 189 lb (85.7 kg)  05/31/18 206 lb (93.4 kg)  04/26/18 208 lb (94.3 kg)    Current diet: well balanced  ------------------------------------------------------------------------  Diabetes Mellitus Type II, Follow-up:   Lab Results  Component Value Date   HGBA1C 7.1 (A) 05/31/2018   HGBA1C 7.2 (A) 01/25/2018   HGBA1C 7.4 (A) 09/24/2017    Last seen for diabetes 4 months ago.  Management since then includes no changes. She reports good compliance with treatment. She is not having side effects.  Current symptoms include none and have been stable. Home blood sugar records: blood sugars are not checked  Episodes of hypoglycemia? no   Current insulin regiment: Is not on insulin Most Recent Eye Exam: UTD Weight trend: decreasing steadily Prior visit with dietician: No Current exercise: none Current diet habits: well balanced  Pertinent Labs:    Component Value Date/Time   CHOL 147 01/25/2018 1211   TRIG 97 01/25/2018 1211   HDL 52 01/25/2018 1211   LDLCALC 76 01/25/2018 1211   CREATININE 1.69 (H) 05/31/2018 1044   CREATININE 1.31 (H) 12/21/2016 1221    Wt Readings from Last 3 Encounters:  09/27/18 189 lb (85.7 kg)  05/31/18 206 lb (93.4 kg)  04/26/18 208 lb (94.3 kg)    ------------------------------------------------------------------------  Lipid/Cholesterol, Follow-up:   Last seen for this 9 months ago.  Management changes since that visit include none. . Last Lipid Panel:    Component Value Date/Time   CHOL 147 01/25/2018 1211   TRIG 97 01/25/2018 1211   HDL 52 01/25/2018 1211   CHOLHDL 2.8 01/25/2018 1211   LDLCALC 76 01/25/2018 1211    Risk factors for vascular disease include diabetes mellitus, hypercholesterolemia and hypertension  She reports good compliance with treatment. She is not having side effects.  Current symptoms include none and have been stable. Weight trend: decreasing steadily Prior visit with  dietician: no Current diet: well balanced Current exercise: none  Wt Readings from Last 3 Encounters:  09/27/18 189 lb (85.7 kg)  05/31/18 206 lb (93.4 kg)  04/26/18 208 lb (94.3 kg)    -------------------------------------------------------------------   Review of Systems  Constitutional: Negative for chills, fatigue and fever.  HENT: Negative for congestion, ear pain, rhinorrhea, sneezing and sore throat.   Eyes: Negative.  Negative for pain and redness.  Respiratory: Negative for cough, shortness of breath and wheezing.   Cardiovascular: Negative for chest pain and leg swelling.  Gastrointestinal: Negative for abdominal pain, blood in stool, constipation, diarrhea and nausea.  Endocrine: Negative for polydipsia and polyphagia.  Genitourinary: Negative.  Negative for dysuria, flank pain, hematuria, pelvic pain, vaginal bleeding and vaginal discharge.  Musculoskeletal: Negative for arthralgias, back pain, gait problem and joint swelling.  Skin: Negative for rash.  Neurological: Negative.  Negative for dizziness, tremors, seizures, weakness, light-headedness, numbness and headaches.  Hematological: Negative for adenopathy.  Psychiatric/Behavioral: Negative.  Negative for behavioral problems, confusion and dysphoric mood. The patient is not nervous/anxious and is not hyperactive.     Social History   Socioeconomic History  . Marital status: Married    Spouse name: Not on file  . Number of children: 2  . Years of education: Not on file  . Highest education level: High school graduate  Occupational History  . Occupation: retired  Scientific laboratory technician  . Financial resource strain: Not hard at all  . Food insecurity    Worry: Never true    Inability: Never true  . Transportation needs    Medical: No    Non-medical: No  Tobacco Use  . Smoking status: Never Smoker  . Smokeless tobacco: Never Used  Substance and Sexual Activity  . Alcohol use: No  . Drug use: No  . Sexual  activity: Yes    Birth control/protection: None  Lifestyle  . Physical activity    Days per week: 0 days    Minutes per session: 0 min  . Stress: Not at all  Relationships  . Social Herbalist on phone: Patient refused    Gets together: Patient refused    Attends religious service: Patient refused    Active member of club or organization: Patient refused    Attends meetings of clubs or organizations: Patient refused    Relationship status: Patient refused  . Intimate partner violence    Fear of current or ex partner: Patient refused    Emotionally abused: Patient refused    Physically abused: Patient refused    Forced sexual activity: Patient refused  Other Topics Concern  . Not on file  Social History Narrative  . Not on file    Past Medical History:  Diagnosis Date  . Aortic stenosis   . Asthma   . Coronary artery disease   . Diabetes mellitus without complication ([place])   . History of measles   . Hypertension   . Nonischemic cardiomyopathy ([place])   . Pulmonary hypertension [facility])      Patient Active Problem List   Diagnosis Date Noted  . Chronic combined systolic and diastolic heart failure ([place]) 09/27/2017  . Urinary frequency 09/27/2017  . Chronic kidney disease, stage 3 ([place]) 03/15/2017  . B12 deficiency 03/01/2017  . Lymphedema 02/14/2017  . Chronic venous insufficiency  02/14/2017  . UTI (urinary tract infection) 09/21/2016  . Nonischemic cardiomyopathy ([place]) 06/14/2016  . Coronary artery disease involving native coronary artery of native heart without angina pectoris 06/14/2016  . Bilateral lower extremity edema 06/14/2016  . Multiple myeloma not having achieved remission ([place]) 03/26/2016  . Back pain 03/26/2016  . Diverticulosis 03/20/2016  . Atherosclerosis of aorta ([place]) 03/20/2016  . Congestive heart failure ([place]) 03/07/2016  . Monoclonal gammopathy 01/25/2016  . Anemia 01/25/2016  . LVH (left ventricular hypertrophy) 08/24/2015  . Aortic  valve stenosis 07/14/2015  . Urinary incontinence 03/02/2015  . Diabetic retinopathy with macular edema ([place]) 02/19/2015  . Constipation 12/02/2014  . Osteoarthritis 07/31/2014  . Insomnia 06/30/2014  . Bleeding internal hemorrhoids 06/30/2014  . Osteopenia 06/30/2014  . Fecal occult blood test positive 06/30/2014  . Vitamin D deficiency 04/29/2009  . Diabetes mellitus with nephropathy ([place]) 01/28/2009  . Eczema intertrigo 01/03/2008  . Hyperlipidemia 04/02/2006  . Edema 08/11/2003  . BMI 40.0-44.9, adult ([place]) 09/22/2002  . Intermittent asthma, uncontrolled 08/31/2000  . Essential hypertension 08/31/2000  . Leg varices 04/18/1999    Past Surgical History:  Procedure Laterality Date  . Cyst(solitary) of breast:removed    . RIGHT/LEFT HEART CATH AND CORONARY ANGIOGRAPHY N/A 03/07/2016   Procedure: Right/Left Heart Cath and Coronary Angiography;  Surgeon: Nelva Bush, MD;  Location: [facility];  Service: Cardiovascular;  Laterality: N/A;  . TUBAL LIGATION      Her family history includes Cancer in her mother and sister; Diabetes in her brother, mother, and sister; Heart disease in her mother; Heart failure in her mother; Hypertension in her sister; Mesothelioma in her brother; Parkinson's disease in her father.   Current Outpatient Medications:  .  amLODipine (NORVASC) 5 MG tablet, TAKE 1 TABLET BY MOUTH EVERY DAY, Disp: 90 tablet, Rfl: 2 .  aspirin EC 81 MG tablet, Take 1 tablet (81 mg total) by mouth daily., Disp: 90 tablet, Rfl: 3 .  Cholecalciferol (VITAMIN D) 50 MCG (2000 UT) tablet, Take 2,000 Units by mouth daily., Disp: , Rfl:  .  cyanocobalamin 500 MCG tablet, Take 500 mcg by mouth daily., Disp: , Rfl:  .  furosemide (LASIX) 40 MG tablet, TAKE 1 TABLET BY MOUTH EVERY DAY, Disp: 90 tablet, Rfl: 3 .  HYDROcodone-acetaminophen (NORCO) 10-325 MG tablet, Take 1 tablet by mouth every 8 (eight) hours as needed., Disp: 90 tablet, Rfl: 0 .  lisinopril (ZESTRIL) 20 MG  tablet, Take 1 tablet (20 mg total) by mouth daily. NEEDS OFFICE VISIT-PLEASE CALL [phone] TO SCHEDULE. THANK YOU!, Disp: 30 tablet, Rfl: 0 .  lovastatin (MEVACOR) 20 MG tablet, Take 1 tablet (20 mg total) by mouth at bedtime., Disp: 90 tablet, Rfl: 4 .  meloxicam (MOBIC) 15 MG tablet, TAKE 1 TABLET (15 MG TOTAL) BY MOUTH DAILY AS NEEDED FOR PAIN. TAKE WITH FOOD, Disp: 90 tablet, Rfl: 3 .  metFORMIN (GLUCOPHAGE-XR) 750 MG 24 hr tablet, TAKE 2 TABLETS (1,500 MG TOTAL) BY MOUTH DAILY. (Patient taking differently: Take 1,500 mg by mouth 2 (two) times daily. ), Disp: 180 tablet, Rfl: 3 .  metoprolol tartrate (LOPRESSOR) 50 MG tablet, TAKE 1 TABLET BY MOUTH TWICE A DAY, Disp: 180 tablet, Rfl: 3 .  spironolactone (ALDACTONE) 25 MG tablet, Take 1 tablet (25 mg total) by mouth daily., Disp: 90 tablet, Rfl: 3 .  tolterodine (DETROL) 2 MG tablet, Take 1 tablet (2 mg total) by mouth 2 (two) times daily., Disp: 60 tablet, Rfl: 3 .  traZODone (DESYREL)  100 MG tablet, TAKE 1 TABLET BY MOUTH EVERY NIGHT AT BEDTIME AS NEEDED FOR INSOMNIA, Disp: 90 tablet, Rfl: 4  Patient Care Team: Birdie Sons, MD as PCP - General (Family Medicine) End, Harrell Gave, MD as PCP - Cardiology (Cardiology) Eulogio Bear, MD as Consulting Physician (Ophthalmology) Festus Aloe, MD as Consulting Physician (Urology) Lequita Asal, MD as Referring Physician (Hematology and Oncology)     Objective:    Vitals: BP 110/62 (BP Location: Right Arm, Patient Position: Sitting, Cuff Size: Large)   Pulse (!) 52   Temp (!) 96.6 F (35.9 C) (Temporal)   Resp 16   Ht '5\' 4"'$  (1.626 m)   Wt 189 lb (85.7 kg)   SpO2 98% Comment: room air  BMI 32.44 kg/m   Physical Exam   General Appearance:    Alert, cooperative, no distress, appears stated age  Head:    Normocephalic, without obvious abnormality, atraumatic  Eyes:    PERRL, conjunctiva/corneas clear, EOM's intact, fundi    benign, both eyes  Ears:    Normal TM's  and external ear canals, both ears  Nose:   Nares normal, septum midline, mucosa normal, no drainage    or sinus tenderness  Throat:   Lips, mucosa, and tongue normal; teeth and gums normal  Neck:   Supple, symmetrical, trachea midline, no adenopathy;    thyroid:  no enlargement/tenderness/nodules; no carotid   bruit or JVD  Back:     Symmetric, no curvature, ROM normal, no CVA tenderness  Lungs:     Clear to auscultation bilaterally, respirations unlabored  Chest Wall:    No tenderness or deformity   Heart:    Bradycardic. Normal rhythm.  2/6 harsh, crescendo-decrescendo, systolic murmur at right upper sternal border  Breast Exam:    PATIENT DECLINED EXAM  Abdomen:     Soft, non-tender, bowel sounds active all four quadrants,    no masses, no organomegaly  Pelvic:    not indicated; post-menopausal, no abnormal Pap smears in past  Extremities:   All extremities are intact. No cyanosis,3+ EDEMA.   Pulses:   2+ and symmetric all extremities  Skin:   Skin color, texture, turgor normal, no rashes or lesions  Lymph nodes:   Cervical, supraclavicular, and axillary nodes normal  Neurologic:   CNII-XII intact, normal strength, sensation and reflexes    throughout     Activities of Daily Living In your present state of health, do you have any difficulty performing the following activities: 09/26/2018  Hearing? N  Vision? N  Difficulty concentrating or making decisions? Y  Comment Has some memory loss.  Walking or climbing stairs? Y  Comment Unable to lift legs up to climb stairs.  Dressing or bathing? Y  Comment Has assistance getting in and out of shower.  Doing errands, shopping? Y  Comment Does not drive.  Preparing Food and eating ? N  Using the Toilet? N  In the past six months, have you accidently leaked urine? Y  Comment Is incontinent.  Do you have problems with loss of bowel control? N  Managing your Medications? Y  Comment Husband manages medications.  Managing your  Finances? Y  Comment Husband manages finances.  Housekeeping or managing your Housekeeping? N  Some recent data might be hidden    Fall Risk Assessment Fall Risk  09/26/2018 09/24/2017 09/21/2016 09/08/2015 07/01/2014  Falls in the past year? 1 No Yes Yes Yes  Number falls in past yr: 1 - 2 or more  2 or more 1  Injury with Fall? 0 - No No No  Follow up Falls prevention discussed - Falls prevention discussed - Falls evaluation completed     Depression Screen PHQ 2/9 Scores 09/26/2018 09/24/2017 09/21/2016 09/08/2015  PHQ - 2 Score 0 0 2 0  PHQ- 9 Score - - 7 -    No flowsheet data found.     Assessment & Plan:    Annual Physical Reviewed patient's Family Medical History Reviewed and updated list of patient's medical providers Assessment of cognitive impairment was done Assessed patient's functional ability Established a written schedule for health screening [place] Completed and Reviewed  Exercise Activities and Dietary recommendations Goals    . DIET - REDUCE SUGAR INTAKE     Recommend to cut out junk foods and sweets in daily diet to help aid in weight loss.     . Fall Prevention     Recommend removing all area rugs from the home to prevent falls.        Immunization History  Administered Date(s) Administered  . Fluad Quad(high Dose 65+) 09/27/2018  . Influenza, High Dose Seasonal PF 12/02/2014, 09/08/2015, 09/21/2016, 09/24/2017  . Pneumococcal Conjugate-13 05/09/2013  . Pneumococcal Polysaccharide-23 01/19/2011    Health Maintenance  Topic Date Due  . FOOT EXAM  05/12/1955  . MAMMOGRAM  05/18/2018  . INFLUENZA VACCINE  08/03/2018  . TETANUS/TDAP  09/03/2026 (Originally 05/12/1964)  . HEMOGLOBIN A1C  12/01/2018  . OPHTHALMOLOGY EXAM  06/27/2019  . DEXA SCAN  07/10/2019  . COLONOSCOPY  07/10/2022  . Hepatitis C Screening  Completed  . PNA vac Low Risk Adult  Completed     Discussed health benefits of physical activity, and encouraged her  to engage in regular exercise appropriate for her age and condition.    ------------------------------------------------------------------------------------------------------------  1. Annual physical exam Generally doing well.   2. Essential hypertension Well controlled. . Ccm   3. Diabetes mellitus with nephropathy (HCC)  - Hemoglobin A1c  4. Vitamin D deficiency  - VITAMIN D 25 Hydroxy (Vit-D Deficiency, Fractures)  5. Pure hypercholesterolemia She is tolerating lovastatin well with no adverse effects.   - Comprehensive metabolic panel - Lipid panel - CBC with Differential/Platelet  6. Severe nonproliferative diabetic retinopathy with macular edema associated with type 2 diabetes mellitus, unspecified laterality ([place])   7. B12 deficiency  - Vitamin B12  8. BMI 40.0-44.9, adult ([place]) Counseled regarding prudent diet and regular exercise.    9. Multiple myeloma not having achieved remission ([place]) Continue regular follow up with Dr. Mike Gip.  - CBC with Differential/Platelet  10. Need for influenza vaccination  - Flu Vaccine QUAD High Dose(Fluad)  11. At moderate risk for fall She does have walker and reports she is using it consistently.     Lelon Huh, MD  [facility]

## 2018-09-27 NOTE — Patient Instructions (Signed)
.   Please review the attached list of medications and notify my office if there are any errors.   . Please bring all of your medications to every appointment so we can make sure that our medication list is the same as yours.   . It is especially important to get the annual flu vaccine this year. If you haven't had it already, please go to your pharmacy or call the office as soon as possible to schedule you flu shot.  

## 2018-09-28 LAB — CBC WITH DIFFERENTIAL/PLATELET
Basophils Absolute: 0 10*3/uL (ref 0.0–0.2)
Basos: 1 %
EOS (ABSOLUTE): 0.1 10*3/uL (ref 0.0–0.4)
Eos: 3 %
Hematocrit: 30.5 % — ABNORMAL LOW (ref 34.0–46.6)
Hemoglobin: 9.8 g/dL — ABNORMAL LOW (ref 11.1–15.9)
Immature Grans (Abs): 0 10*3/uL (ref 0.0–0.1)
Immature Granulocytes: 0 %
Lymphocytes Absolute: 1.7 10*3/uL (ref 0.7–3.1)
Lymphs: 31 %
MCH: 29.8 pg (ref 26.6–33.0)
MCHC: 32.1 g/dL (ref 31.5–35.7)
MCV: 93 fL (ref 79–97)
Monocytes Absolute: 0.5 10*3/uL (ref 0.1–0.9)
Monocytes: 8 %
Neutrophils Absolute: 3.2 10*3/uL (ref 1.4–7.0)
Neutrophils: 57 %
Platelets: 252 10*3/uL (ref 150–450)
RBC: 3.29 x10E6/uL — ABNORMAL LOW (ref 3.77–5.28)
RDW: 13.4 % (ref 11.7–15.4)
WBC: 5.6 10*3/uL (ref 3.4–10.8)

## 2018-09-28 LAB — COMPREHENSIVE METABOLIC PANEL
ALT: 5 IU/L (ref 0–32)
AST: 12 IU/L (ref 0–40)
Albumin/Globulin Ratio: 1.2 (ref 1.2–2.2)
Albumin: 4 g/dL (ref 3.7–4.7)
Alkaline Phosphatase: 54 IU/L (ref 39–117)
BUN/Creatinine Ratio: 16 (ref 12–28)
BUN: 27 mg/dL (ref 8–27)
Bilirubin Total: <0.2 mg/dL (ref 0.0–1.2)
CO2: 24 mmol/L (ref 20–29)
Calcium: 10.1 mg/dL (ref 8.7–10.3)
Chloride: 101 mmol/L (ref 96–106)
Creatinine, Ser: 1.67 mg/dL — ABNORMAL HIGH (ref 0.57–1.00)
GFR calc Af Amer: 34 mL/min/{1.73_m2} — ABNORMAL LOW (ref >59)
GFR calc non Af Amer: 30 mL/min/{1.73_m2} — ABNORMAL LOW (ref >59)
Globulin, Total: 3.4 g/dL (ref 1.5–4.5)
Glucose: 127 mg/dL — ABNORMAL HIGH (ref 65–99)
Potassium: 4.4 mmol/L (ref 3.5–5.2)
Sodium: 141 mmol/L (ref 134–144)
Total Protein: 7.4 g/dL (ref 6.0–8.5)

## 2018-09-28 LAB — LIPID PANEL
Chol/HDL Ratio: 3.1 ratio (ref 0.0–4.4)
Cholesterol, Total: 151 mg/dL (ref 100–199)
HDL: 48 mg/dL (ref >39)
LDL Chol Calc (NIH): 84 mg/dL (ref 0–99)
Triglycerides: 105 mg/dL (ref 0–149)
VLDL Cholesterol Cal: 19 mg/dL (ref 5–40)

## 2018-09-28 LAB — HEMOGLOBIN A1C
Est. average glucose Bld gHb Est-mCnc: 154 mg/dL
Hgb A1c MFr Bld: 7 % — ABNORMAL HIGH (ref 4.8–5.6)

## 2018-09-28 LAB — VITAMIN B12: Vitamin B-12: >2000 pg/mL — ABNORMAL HIGH (ref 232–1245)

## 2018-09-28 LAB — VITAMIN D 25 HYDROXY (VIT D DEFICIENCY, FRACTURES): Vit D, 25-Hydroxy: 34.3 ng/mL (ref 30.0–100.0)

## 2018-09-30 ENCOUNTER — Telehealth: Payer: Self-pay

## 2018-09-30 ENCOUNTER — Other Ambulatory Visit: Payer: Self-pay | Admitting: *Deleted

## 2018-09-30 MED ORDER — SPIRONOLACTONE 25 MG PO TABS: 25.0000 mg | ORAL_TABLET | Freq: Every day | ORAL | 0 refills | Status: DC

## 2018-09-30 NOTE — Telephone Encounter (Signed)
Recipient who answered home number states that this is not patients number. KW  LMTCB on cell-KW

## 2018-09-30 NOTE — Telephone Encounter (Signed)
Reached out to the patient and spoke with her husband, i try to inform him that her B-12 levels are to high and he refused to listen stating that Dr Caryn Section is responible for her labs and he dont know why we are calling her. i tryed to explain to him that they sent the labs over for Dr Mike Gip to review.

## 2018-09-30 NOTE — Telephone Encounter (Signed)
-----   Message from Birdie Sons, MD sent at 09/29/2018  8:02 AM EDT ----- Slightly anemic, but stable, otherwise labs are good. Continue current medications.  Follow up in march as scheduled.

## 2018-09-30 NOTE — Telephone Encounter (Signed)
It was good. She could cut back on b12 supplements if she like. Looks like she is taking 500 a day, can cut back to 250 if she likes.

## 2018-09-30 NOTE — Telephone Encounter (Signed)
Patient's husband was notified of results. Mr. Neitzke also wants Dr. Maralyn Sago opinion concerning pt's B-12 level? Please advise?

## 2018-10-01 NOTE — Telephone Encounter (Signed)
Patients husband was advised. KW

## 2018-10-07 ENCOUNTER — Ambulatory Visit: Payer: Medicare Other | Admitting: Internal Medicine

## 2018-10-07 DIAGNOSIS — Z1231 Encounter for screening mammogram for malignant neoplasm of breast: Secondary | ICD-10-CM | POA: Diagnosis not present

## 2018-10-07 LAB — HM MAMMOGRAPHY

## 2018-10-07 NOTE — Progress Notes (Deleted)
Follow-up Outpatient Visit Date: 10/07/2018  Primary Care Provider: Birdie Sons, MD [address] [place] [address]  Chief Complaint: ***  HPI:  [name] is a 74 y.o. year-old female with history of non-obstructive CAD, NICM with chronic systolic and diastolic heart failure, HTN, DM, and multiple myeloma, who presents for follow-up of heart failure.  We had a virtual visit in late April, at which time [name] was doing well other than longstanding urinary frequency exacerbated by diuretic use.  She noted that [name] had improved her nocturia.  She denied shortness of breath, edema, and chest pain.  Labs showed moderate renal insufficiency, slightly worse than baseline.  Follow-up CMP by Dr. Caryn Watson showed stable kidney function.  --------------------------------------------------------------------------------------------------  Cardiovascular History & Procedures: Cardiovascular Problems:  Chronic systolic and diastolic heart failure secondary to non-ischemic cardiomyopathy  Nonobstructive coronary artery disease  Moderate aortic stenosis  Risk Factors:  Known coronary artery disease, hypertension, hyperlipidemia, diabetes mellitus, sedentary lifestyle, obesity, and age > 21  Cath/PCI:  LHC/RHC (03/07/16): LMCA normal. LAD with 30% ostial stenosi, 30$ midvessel narrowing, and 50% distal disease. LCx with 40% midvessel lesion. RCA with 30% proximal narrowing. LVEDP 26 mmHg. RA 18 (prominent "M" appearance), RV 82/21, PA mean 55, PCWP 25. Ao sat 96%, PA sat 54%, Fick Co/Ci 3.2/1.7. PVR 9.4 Wood units. No significant AoV gradient.  CV Surgery:  None  EP Procedures and Devices:  None  Non-Invasive Evaluation(s):  ABI's (07/26/16):Right:ABI notobtained, TBI 1.8. Left:ABI 1.9, TBI 1.0.Triphasic waveforms noted in both lower extremities.  TTE (07/26/16): Normal LV size with moderate LVH. LVEF 55-60% with normal wall motion. Grade 1 diastolic  dysfunction. Mild aortic stenosis (mean gradient 11 mmHg). Mitral annular calcification with mild to moderate MR. Mild left atrial enlargement. Normal RV size and function. Normal PA pressure.  TTE (02/25/16): Normal LV size with moderate LVH. LVEF 25-30% with grade 2 diastolic dysfunction. Thickened aortic valve with likely moderate stenosis. MAC with mild MR. Mild LA enlargement. Mildly to moderately reduced RV contraction. Mild-moderate TR. Moderately PH (RVSP 55 mmHg).  Recent CV Pertinent Labs: Lab Results  Component Value Date   CHOL 151 09/27/2018   HDL 48 09/27/2018   LDLCALC 84 09/27/2018   TRIG 105 09/27/2018   CHOLHDL 3.1 09/27/2018   INR 1.0 03/02/2016   BNP 1,592.0 (H) 02/09/2016   K 4.4 09/27/2018   BUN 27 09/27/2018   CREATININE 1.67 (H) 09/27/2018   CREATININE 1.31 (H) 12/21/2016    Past medical and surgical history were reviewed and updated in EPIC.  No outpatient medications have been marked as taking for the 10/07/18 encounter (Appointment) with [name], Jillian Gave, MD.    Allergies: Hydrochlorothiazide and Penicillins  Social History   Tobacco Use  . Smoking status: Never Smoker  . Smokeless tobacco: Never Used  Substance Use Topics  . Alcohol use: No  . Drug use: No    Family History  Problem Relation Age of Onset  . Cancer Mother        Uterine cancer  . Diabetes Mother   . Heart disease Mother   . Heart failure Mother   . Parkinson's disease Father   . Cancer Sister        breast cancer  . Diabetes Brother        Non-insulin Dependent Diabetes Mellitus  . Mesothelioma Brother   . Hypertension Sister   . Diabetes Sister        Non-insulin dependent Diabetes Mellitus  Review of Systems: A 12-system review of systems was performed and was negative except as noted in the HPI.  --------------------------------------------------------------------------------------------------  Physical Exam: There were no vitals taken for this visit.   General:  *** HEENT: No conjunctival pallor or scleral icterus. Moist mucous membranes.  OP clear. Neck: Supple without lymphadenopathy, thyromegaly, JVD, or HJR. No carotid bruit. Lungs: Normal work of breathing. Clear to auscultation bilaterally without wheezes or crackles. Heart: Regular rate and rhythm without murmurs, rubs, or gallops. Non-displaced PMI. Abd: Bowel sounds present. Soft, NT/ND without hepatosplenomegaly Ext: No lower extremity edema. Radial, PT, and DP pulses are 2+ bilaterally. Skin: Warm and dry without rash.  EKG:  ***  Lab Results  Component Value Date   WBC 5.6 09/27/2018   HGB 9.8 (L) 09/27/2018   HCT 30.5 (L) 09/27/2018   MCV 93 09/27/2018   PLT 252 09/27/2018    Lab Results  Component Value Date   NA 141 09/27/2018   K 4.4 09/27/2018   CL 101 09/27/2018   CO2 24 09/27/2018   BUN 27 09/27/2018   CREATININE 1.67 (H) 09/27/2018   GLUCOSE 127 (H) 09/27/2018   ALT 5 09/27/2018    Lab Results  Component Value Date   CHOL 151 09/27/2018   HDL 48 09/27/2018   LDLCALC 84 09/27/2018   TRIG 105 09/27/2018   CHOLHDL 3.1 09/27/2018    --------------------------------------------------------------------------------------------------  ASSESSMENT AND PLAN: [name] Anginette Espejo, MD 10/07/2018 7:04 AM

## 2018-10-08 NOTE — Progress Notes (Signed)
Follow-up Outpatient Visit Date: 10/09/2018  Primary Care Provider: Birdie Sons, MD [address] [place] [address]  Chief Complaint: Follow-up nonischemic cardiomyopathy  HPI:  [name] is a 73 y.o. year-old female with history of non-obstructive CAD, NICM with chronic systolic and diastolic heart failure, HTN, DM, and multiple myeloma, who presents for follow-up of heart failure.  We had a virtual visit in late April, at which time [name] was doing well other than longstanding urinary frequency exacerbated by diuretic use.  She noted that Vesicare had improved her nocturia.  She denied shortness of breath, edema, and chest pain.  Labs showed moderate renal insufficiency, slightly worse than baseline.  Follow-up CMP by Dr. Caryn Section showed stable kidney function.  Today, [name] reports that she feels relatively well.  Her mobility remains quite limited, as she ambulates only with a walker.  She denies chest pain, shortness of breath, palpitations, lightheadedness, and significant edema.  She wears compression stockings intermittently but complains that they are uncomfortable when wearing for extended periods.  Urinary frequency has improved following urologic evaluation.  --------------------------------------------------------------------------------------------------  Cardiovascular History & Procedures: Cardiovascular Problems:  Chronic systolic and diastolic heart failure secondary to non-ischemic cardiomyopathy  Nonobstructive coronary artery disease  Aortic stenosis  Risk Factors:  Known coronary artery disease, hypertension, hyperlipidemia, diabetes mellitus, sedentary lifestyle, obesity, and age > 17  Cath/PCI:  LHC/RHC (03/07/16): LMCA normal. LAD with 30% ostial stenosi, 30$ midvessel narrowing, and 50% distal disease. LCx with 40% midvessel lesion. RCA with 30% proximal narrowing. LVEDP 26 mmHg. RA 18 (prominent "M" appearance), RV 82/21, PA mean  55, PCWP 25. Ao sat 96%, PA sat 54%, Fick Co/Ci 3.2/1.7. PVR 9.4 Wood units. No significant AoV gradient.  CV Surgery:  None  EP Procedures and Devices:  None  Non-Invasive Evaluation(s):  ABI's (07/26/16):Right:ABI notobtained, TBI 1.8. Left:ABI 1.9, TBI 1.0.Triphasic waveforms noted in both lower extremities.  TTE (07/26/16): Normal LV size with moderate LVH. LVEF 55-60% with normal wall motion. Grade 1 diastolic dysfunction. Mild aortic stenosis (mean gradient 11 mmHg). Mitral annular calcification with mild to moderate MR. Mild left atrial enlargement. Normal RV size and function. Normal PA pressure.  TTE (02/25/16): Normal LV size with moderate LVH. LVEF 25-30% with grade 2 diastolic dysfunction. Thickened aortic valve with likely moderate stenosis. MAC with mild MR. Mild LA enlargement. Mildly to moderately reduced RV contraction. Mild-moderate TR. Moderately PH (RVSP 55 mmHg).  Recent CV Pertinent Labs: Lab Results  Component Value Date   CHOL 151 09/27/2018   HDL 48 09/27/2018   LDLCALC 84 09/27/2018   TRIG 105 09/27/2018   CHOLHDL 3.1 09/27/2018   INR 1.0 03/02/2016   BNP 1,592.0 (H) 02/09/2016   K 4.4 09/27/2018   BUN 27 09/27/2018   CREATININE 1.67 (H) 09/27/2018   CREATININE 1.31 (H) 12/21/2016    Past medical and surgical history were reviewed and updated in EPIC.  Current Meds  Medication Sig  . amLODipine (NORVASC) 5 MG tablet TAKE 1 TABLET BY MOUTH EVERY DAY  . aspirin EC 81 MG tablet Take 1 tablet (81 mg total) by mouth daily.  . Cholecalciferol (VITAMIN D) 50 MCG (2000 UT) tablet Take 2,000 Units by mouth daily.  . cyanocobalamin 500 MCG tablet Take 250 mcg by mouth daily.   . furosemide (LASIX) 40 MG tablet TAKE 1 TABLET BY MOUTH EVERY DAY  . HYDROcodone-acetaminophen (NORCO) 10-325 MG tablet Take 1 tablet by mouth every 8 (eight) hours as needed.  [place] Kitchen  lisinopril (ZESTRIL) 20 MG tablet Take 1 tablet (20 mg total) by mouth daily.  Mar[place] Kitchen lovastatin  (MEVACOR) 20 MG tablet Take 1 tablet (20 mg total) by mouth at bedtime.  . meloxicam (MOBIC) 15 MG tablet TAKE 1 TABLET (15 MG TOTAL) BY MOUTH DAILY AS NEEDED FOR PAIN. TAKE WITH FOOD  . metFORMIN (GLUCOPHAGE-XR) 750 MG 24 hr tablet TAKE 2 TABLETS (1,500 MG TOTAL) BY MOUTH DAILY. (Patient taking differently: Take 1,500 mg by mouth 2 (two) times daily. )  . metoprolol tartrate (LOPRESSOR) 50 MG tablet Take 1 tablet (50 mg total) by mouth 2 (two) times daily.  [place] Kitchen spironolactone (ALDACTONE) 25 MG tablet Take 1 tablet (25 mg total) by mouth daily.  Mar[place] Kitchen tolterodine (DETROL) 2 MG tablet Take 1 tablet (2 mg total) by mouth 2 (two) times daily.  . traZODone (DESYREL) 100 MG tablet TAKE 1 TABLET BY MOUTH EVERY NIGHT AT BEDTIME AS NEEDED FOR INSOMNIA  . [DISCONTINUED] lisinopril (ZESTRIL) 20 MG tablet Take 1 tablet (20 mg total) by mouth daily. NEEDS OFFICE VISIT-PLEASE CALL [phone] TO SCHEDULE. THANK YOU!  . [DISCONTINUED] metoprolol tartrate (LOPRESSOR) 50 MG tablet TAKE 1 TABLET BY MOUTH TWICE A DAY  . [DISCONTINUED] spironolactone (ALDACTONE) 25 MG tablet Take 1 tablet (25 mg total) by mouth daily.    Allergies: Hydrochlorothiazide and Penicillins  Social History   Tobacco Use  . Smoking status: Never Smoker  . Smokeless tobacco: Never Used  Substance Use Topics  . Alcohol use: No  . Drug use: No    Family History  Problem Relation Age of Onset  . Cancer Mother        Uterine cancer  . Diabetes Mother   . Heart disease Mother   . Heart failure Mother   . Parkinson's disease Father   . Cancer Sister        breast cancer  . Diabetes Brother        Non-insulin Dependent Diabetes Mellitus  . Mesothelioma Brother   . Hypertension Sister   . Diabetes Sister        Non-insulin dependent Diabetes Mellitus    Review of Systems: A 12-system review of systems was performed and was negative except as noted in the HPI.   --------------------------------------------------------------------------------------------------  Physical Exam: BP 126/78 (BP Location: Left Arm, Patient Position: Sitting, Cuff Size: Normal)   Pulse 73   Ht _0  (1.6 m)   Wt 216 lb 12 oz (98.3 kg)   BMI 38.40 kg/m   General: NAD.  Accompanied by her husband. HEENT: No conjunctival pallor or scleral icterus.  Facemask in place. Neck: Supple without lymphadenopathy, thyromegaly, JVD, or HJR. Lungs: Normal work of breathing. Clear to auscultation bilaterally without wheezes or crackles. Heart: Distant heart sounds.  Regular rate and rhythm with 2/6 systolic murmur.  Unable to assess PMI due to body habitus. Abd: Bowel sounds present. Soft, NT/ND.  Unable to assess HSM due to body habitus. Ext: Knee-high compression stockings in place bilaterally without significant lower extremity edema.  2+ radial pulses bilaterally. Skin: Warm and dry without rash.  EKG: Normal sinus rhythm without significant abnormality.  Lab Results  Component Value Date   WBC 5.6 09/27/2018   HGB 9.8 (L) 09/27/2018   HCT 30.5 (L) 09/27/2018   MCV 93 09/27/2018   PLT 252 09/27/2018    Lab Results  Component Value Date   NA 141 09/27/2018   K 4.4 09/27/2018   CL 101 09/27/2018   CO2 24 09/27/2018  BUN 27 09/27/2018   CREATININE 1.67 (H) 09/27/2018   GLUCOSE 127 (H) 09/27/2018   ALT 5 09/27/2018    Lab Results  Component Value Date   CHOL 151 09/27/2018   HDL 48 09/27/2018   LDLCALC 84 09/27/2018   TRIG 105 09/27/2018   CHOLHDL 3.1 09/27/2018    --------------------------------------------------------------------------------------------------  ASSESSMENT AND PLAN: Chronic heart failure with recovered ejection fraction secondary to nonischemic cardiomyopathy: [name] continues to do well.  She appears grossly euvolemic on exam.  Assessment of her functional class is limited due to poor mobility and deconditioning.  Given mild elevation  in creatinine from baseline on most recent lab draws, I have encouraged [name] to increase her fluid intake but to restrict her sodium.  Mild aortic stenosis: Most recent echo showed very mild aortic stenosis, similar to prior findings on catheterization.  Exam today notable for systolic murmur but crisp S2 suggesting that AS is not severe.  Continue clinical follow-up.  Hypertension: Blood pressure adequately controlled.  No medication changes today.  Hyperlipidemia: LDL reasonable on current dose of lovastatin.  Continue management per Dr. Caryn Section.  Type 2 diabetes mellitus: Most recent hemoglobin A1c in late September was reasonable at 7.  However, creatinine noted to be elevated on most recent lab checks (1.67 last month).  In light of this, metformin may need to be discontinued, though I will defer this to Dr. Caryn Section.  Follow-up: Return to clinic in 6 months.  Nelva Bush, MD 10/10/2018 4:08 PM

## 2018-10-09 ENCOUNTER — Other Ambulatory Visit: Payer: Self-pay

## 2018-10-09 ENCOUNTER — Encounter: Payer: Self-pay | Admitting: Internal Medicine

## 2018-10-09 ENCOUNTER — Ambulatory Visit (INDEPENDENT_AMBULATORY_CARE_PROVIDER_SITE_OTHER): Payer: Medicare Other | Admitting: Internal Medicine

## 2018-10-09 VITALS — BP 126/78 | HR 73 | Ht 63.0 in | Wt 216.8 lb

## 2018-10-09 DIAGNOSIS — E785 Hyperlipidemia, unspecified: Secondary | ICD-10-CM | POA: Diagnosis not present

## 2018-10-09 DIAGNOSIS — I350 Nonrheumatic aortic (valve) stenosis: Secondary | ICD-10-CM | POA: Diagnosis not present

## 2018-10-09 DIAGNOSIS — E1122 Type 2 diabetes mellitus with diabetic chronic kidney disease: Secondary | ICD-10-CM

## 2018-10-09 DIAGNOSIS — I10 Essential (primary) hypertension: Secondary | ICD-10-CM

## 2018-10-09 DIAGNOSIS — N183 Chronic kidney disease, stage 3 unspecified: Secondary | ICD-10-CM

## 2018-10-09 DIAGNOSIS — I5022 Chronic systolic (congestive) heart failure: Secondary | ICD-10-CM | POA: Diagnosis not present

## 2018-10-09 DIAGNOSIS — I428 Other cardiomyopathies: Secondary | ICD-10-CM | POA: Diagnosis not present

## 2018-10-09 MED ORDER — METOPROLOL TARTRATE 50 MG PO TABS: 50.0000 mg | ORAL_TABLET | Freq: Two times a day (BID) | ORAL | 3 refills | Status: DC

## 2018-10-09 MED ORDER — LISINOPRIL 20 MG PO TABS: 20.0000 mg | ORAL_TABLET | Freq: Every day | ORAL | 3 refills | Status: DC

## 2018-10-09 MED ORDER — SPIRONOLACTONE 25 MG PO TABS: 25.0000 mg | ORAL_TABLET | Freq: Every day | ORAL | 3 refills | Status: DC

## 2018-10-09 NOTE — Patient Instructions (Signed)
Medication Instructions:  Your physician recommends that you continue on your current medications as directed. Please refer to the Current Medication list given to you today.  If you need a refill on your cardiac medications before your next appointment, please call your pharmacy.   Lab work: NONE If you have labs (blood work) drawn today and your tests are completely normal, you will receive your results only by: . MyChart Message (if you have MyChart) OR . A paper copy in the mail If you have any lab test that is abnormal or we need to change your treatment, we will call you to review the results.  Testing/Procedures: NONE  Follow-Up: At [facility], you and your health needs are our priority.  As part of our continuing mission to provide you with exceptional heart care, we have created designated Provider Care Teams.  These Care Teams include your primary Cardiologist (physician) and Advanced Practice Providers (APPs -  Physician Assistants and Nurse Practitioners) who all work together to provide you with the care you need, when you need it. You will need a follow up appointment in 6 months.   Please call our office 2 months in advance to schedule this appointment.  You may see Christopher End, MD or one of the following Advanced Practice Providers on your designated Care Team:   Christopher Berge, NP Ryan Dunn, PA-C . Jacquelyn Visser, PA-C    

## 2018-10-10 ENCOUNTER — Encounter: Payer: Self-pay | Admitting: Internal Medicine

## 2018-12-06 ENCOUNTER — Telehealth: Payer: Self-pay | Admitting: Family Medicine

## 2018-12-06 NOTE — Telephone Encounter (Signed)
Please advise patient that metformin ER has been recalled, need to change to immediate release metformin 500mg  twice a day, please send prescription to pharmacy of her choice

## 2018-12-09 MED ORDER — METFORMIN HCL 500 MG PO TABS: 500.0000 mg | ORAL_TABLET | Freq: Two times a day (BID) | ORAL | 3 refills | Status: DC

## 2018-12-09 NOTE — Telephone Encounter (Signed)
Patient's husband was advised and states that the pharmacy has already switched patient medication due to the recall. Medication was send into pharmacy as well.

## 2018-12-18 ENCOUNTER — Ambulatory Visit: Payer: Medicare Other | Admitting: Urology

## 2018-12-18 ENCOUNTER — Other Ambulatory Visit: Payer: Self-pay

## 2018-12-18 ENCOUNTER — Encounter: Payer: Self-pay | Admitting: Urology

## 2018-12-18 VITALS — BP 140/80 | HR 72 | Ht 63.0 in | Wt 216.0 lb

## 2018-12-18 DIAGNOSIS — R350 Frequency of micturition: Secondary | ICD-10-CM

## 2018-12-18 DIAGNOSIS — N3941 Urge incontinence: Secondary | ICD-10-CM

## 2018-12-18 MED ORDER — OXYBUTYNIN CHLORIDE 5 MG PO TABS: 5.0000 mg | ORAL_TABLET | Freq: Two times a day (BID) | ORAL | 3 refills | Status: DC

## 2018-12-18 NOTE — Patient Instructions (Signed)

## 2018-12-18 NOTE — Progress Notes (Signed)
12/18/2018 10:44 AM   [name] 06-03-1945 [id number]  Referring provider: Birdie Sons, [place],  [place] [address]  Chief Complaint  Patient presents with  . Urinary Incontinence    HPI:  F/u -   1) frequency, urgency, UUI  - Feb 2020 - 10 ppd use. Ng risk includes DM and immobility (wheelchair). PVR 86 ml. She tried solifenacin and timed voiding. She started tolterodine twice daily and was doing well.   2) MH, bladder erythema - A Mar 2018 CT / PET showed a normal GU tract done for MM.She underwent renal US 03/06/2018 which was benign. She underwent cystoscopy for urgency and microscopic hematuria June 07, 2018.  She had an ulcerative erythematous area with cloudy urine on the right bladder wall.  She took nightly doxycycline and repeat cystoscopy was normal Jul 2020.   Today, she is taking tolterodine 2 mg twice daily but it is expensive. No dysuria or gross hematuria. She has some diarrhea last week. No constipation. Tolterodine was working well.   PMH: Past Medical History:  Diagnosis Date  . Aortic stenosis   . Asthma   . Coronary artery disease   . Diabetes mellitus without complication ([place])   . History of measles   . Hypertension   . Nonischemic cardiomyopathy ([place])   . Pulmonary hypertension ([place])     Surgical History: Past Surgical History:  Procedure Laterality Date  . Cyst(solitary) of breast:removed    . RIGHT/LEFT HEART CATH AND CORONARY ANGIOGRAPHY N/A 03/07/2016   Procedure: Right/Left Heart Cath and Coronary Angiography;  Surgeon: Nelva Bush, MD;  Location: [facility];  Service: Cardiovascular;  Laterality: N/A;  . TUBAL LIGATION      Home Medications:  Allergies as of 12/18/2018      Reactions   Hydrochlorothiazide    Leg Cramps   Penicillins Rash      Medication List       Accurate as of December 18, 2018 10:44 AM. If you have any questions, ask your nurse or doctor.        amLODipine 5  MG tablet Commonly known as: NORVASC TAKE 1 TABLET BY MOUTH EVERY DAY   aspirin EC 81 MG tablet Take 1 tablet (81 mg total) by mouth daily.   furosemide 40 MG tablet Commonly known as: LASIX TAKE 1 TABLET BY MOUTH EVERY DAY   HYDROcodone-acetaminophen 10-325 MG tablet Commonly known as: NORCO Take 1 tablet by mouth every 8 (eight) hours as needed.   lisinopril 20 MG tablet Commonly known as: ZESTRIL Take 1 tablet (20 mg total) by mouth daily.   lovastatin 20 MG tablet Commonly known as: MEVACOR Take 1 tablet (20 mg total) by mouth at bedtime.   meloxicam 15 MG tablet Commonly known as: MOBIC TAKE 1 TABLET (15 MG TOTAL) BY MOUTH DAILY AS NEEDED FOR PAIN. TAKE WITH FOOD   metFORMIN 500 MG tablet Commonly known as: GLUCOPHAGE Take 1 tablet (500 mg total) by mouth 2 (two) times daily with a meal.   metoprolol tartrate 50 MG tablet Commonly known as: LOPRESSOR Take 1 tablet (50 mg total) by mouth 2 (two) times daily.   spironolactone 25 MG tablet Commonly known as: ALDACTONE Take 1 tablet (25 mg total) by mouth daily.   tolterodine 2 MG tablet Commonly known as: DETROL Take 1 tablet (2 mg total) by mouth 2 (two) times daily.   traZODone 100 MG tablet Commonly known as: DESYREL TAKE 1 TABLET  BY MOUTH EVERY NIGHT AT BEDTIME AS NEEDED FOR INSOMNIA   vitamin B-12 500 MCG tablet Commonly known as: CYANOCOBALAMIN Take 250 mcg by mouth daily.   Vitamin D 50 MCG (2000 UT) tablet Take 2,000 Units by mouth daily.       Allergies:  Allergies  Allergen Reactions  . Hydrochlorothiazide     Leg Cramps  . Penicillins Rash    Family History: Family History  Problem Relation Age of Onset  . Cancer Mother        Uterine cancer  . Diabetes Mother   . Heart disease Mother   . Heart failure Mother   . Parkinson's disease Father   . Cancer Sister        breast cancer  . Diabetes Brother        Non-insulin Dependent Diabetes Mellitus  . Mesothelioma Brother   .  Hypertension Sister   . Diabetes Sister        Non-insulin dependent Diabetes Mellitus    Social History:  reports that she has never smoked. She has never used smokeless tobacco. She reports that she does not drink alcohol or use drugs.  ROS: UROLOGY Frequent Urination?: No Hard to postpone urination?: No Burning/pain with urination?: No Get up at night to urinate?: No Leakage of urine?: No Urine stream starts and stops?: No Trouble starting stream?: No Do you have to strain to urinate?: No Blood in urine?: No Urinary tract infection?: No Sexually transmitted disease?: No Injury to kidneys or bladder?: No Painful intercourse?: No Weak stream?: No Currently pregnant?: No Vaginal bleeding?: No Last menstrual period?: n  Gastrointestinal Nausea?: No Vomiting?: No Indigestion/heartburn?: Yes Diarrhea?: No Constipation?: No  Constitutional Fever: No Night sweats?: No Weight loss?: No Fatigue?: No  Skin Skin rash/lesions?: No Itching?: No  Eyes Blurred vision?: No Double vision?: No  Ears/Nose/Throat Sore throat?: No Sinus problems?: No  Hematologic/Lymphatic Swollen glands?: No Easy bruising?: No  Cardiovascular Leg swelling?: No Chest pain?: No  Respiratory Cough?: No Shortness of breath?: No  Endocrine Excessive thirst?: No  Musculoskeletal Back pain?: No Joint pain?: No  Neurological Headaches?: No Dizziness?: No  Psychologic Depression?: No Anxiety?: No  Physical Exam: BP (!) 143/82   Pulse 67   Ht 5\' 3"  (1.6 m)   Wt 192 lb 6.4 oz (87.3 kg)   BMI 34.08 kg/m   Constitutional:  Alert and oriented, No acute distress. In wheelchair.  HEENT: [place] AT, moist mucus membranes.  Trachea midline, no masses. Cardiovascular: No clubbing, cyanosis, or edema. Respiratory: Normal respiratory effort, no increased work of breathing. GI: Abdomen is soft, nontender, nondistended, no abdominal masses GU: No CVA tenderness Skin: No rashes, bruises  or suspicious lesions. Neurologic: Grossly intact, no focal deficits, moving all 4 extremities. Psychiatric: Normal mood and affect.  Laboratory Data: Lab Results  Component Value Date   WBC 5.6 09/27/2018   HGB 9.8 (L) 09/27/2018   HCT 30.5 (L) 09/27/2018   MCV 93 09/27/2018   PLT 252 09/27/2018    Lab Results  Component Value Date   CREATININE 1.67 (H) 09/27/2018    No results found for: PSA  No results found for: TESTOSTERONE  Lab Results  Component Value Date   HGBA1C 7.0 (H) 09/27/2018    Urinalysis    Component Value Date/Time   APPEARANCEUR [place] (A) 07/31/2018 [place] Negative 07/31/2018 [place] Negative 07/31/2018 [place] negative 09/21/2016 1249   PROTEINUR Negative 07/31/2018 0841  UROBILINOGEN 0.2 01/25/2018 1143   NITRITE Negative 07/31/2018 0841   LEUKOCYTESUR 2+ (A) 07/31/2018 0841    Lab Results  Component Value Date   LABMICR See below: 07/31/2018   WBCUA 11-30 (A) 07/31/2018   RBCUA 3-10 (A) 02/27/2018   LABEPIT 0-10 07/31/2018   BACTERIA Few 07/31/2018    Pertinent Imaging: n/a No results found for this or any previous visit. No results found for this or any previous visit. No results found for this or any previous visit. No results found for this or any previous visit. Results for orders placed during the hospital encounter of 03/06/18  US RENAL   Narrative CLINICAL DATA:  Microhematuria  EXAM: RENAL / URINARY TRACT ULTRASOUND COMPLETE  COMPARISON:  03/31/2016  FINDINGS: Right Kidney:  Renal measurements: 10.3 x 5.6 x 4.1 cm. = volume: 122 mL. Mild cortical thinning is noted. A 9 mm hypoechoic structure is noted within the midportion of the right kidney consistent with small cyst. No obstructive changes are seen. No renal calculi are noted.  Left Kidney:  Renal measurements: 10.1 x 5.4 x 4.6 cm = volume: 132 mL. Mild cortical thinning is noted. No mass lesion or hydronephrosis  is noted.  Bladder:  Appears normal for degree of bladder distention.  IMPRESSION: Mild cortical thinning bilaterally.  Tiny right renal cyst.   Electronically Signed   By: Inez Catalina M.D.   On: 03/07/2018 08:31    No results found for this or any previous visit. No results found for this or any previous visit. No results found for this or any previous visit.  Assessment & Plan:    Freq, urgency and UUI -  Change to oxybutynin 5 mg po bid and discussed again nature r/b/a to anticholinergics.   No follow-ups on file.  Festus Aloe, MD  [facility] [address], [place], [place] [address] [phone]

## 2019-02-28 ENCOUNTER — Other Ambulatory Visit: Payer: Self-pay | Admitting: Internal Medicine

## 2019-03-03 ENCOUNTER — Other Ambulatory Visit: Payer: Self-pay | Admitting: Family Medicine

## 2019-03-03 NOTE — Telephone Encounter (Signed)
Requested Prescriptions  Pending Prescriptions Disp Refills  . lovastatin (MEVACOR) 20 MG tablet [Pharmacy Med Name: LOVASTATIN 20 MG TABLET] 90 tablet 4    Sig: TAKE 1 TABLET BY MOUTH EVERYDAY AT BEDTIME     Cardiovascular:  Antilipid - Statins Failed - 03/03/2019 12:58 AM      Failed - LDL in normal range and within 360 days    LDL Chol Calc (NIH)  Date Value Ref Range Status  09/27/2018 84 0 - 99 mg/dL Final         Passed - Total Cholesterol in normal range and within 360 days    Cholesterol, Total  Date Value Ref Range Status  09/27/2018 151 100 - 199 mg/dL Final         Passed - HDL in normal range and within 360 days    HDL  Date Value Ref Range Status  09/27/2018 48 >39 mg/dL Final         Passed - Triglycerides in normal range and within 360 days    Triglycerides  Date Value Ref Range Status  09/27/2018 105 0 - 149 mg/dL Final         Passed - Patient is not pregnant      Passed - Valid encounter within last 12 months    Recent Outpatient Visits          5 months ago Annual physical exam   [facility] Birdie Sons, MD   9 months ago Diabetes mellitus with nephropathy [facility])   [facility] Birdie Sons, MD   1 year ago Urinary incontinence, unspecified type   [facility] Birdie Sons, MD   1 year ago Annual physical exam   [facility] Birdie Sons, MD   1 year ago Diabetes mellitus with nephropathy [facility])   [facility] Birdie Sons, MD      Future Appointments            In 3 weeks Fisher, Kirstie Peri, MD [facility], [place]   In 1 month End, Harrell Gave, MD [facility], [place]   In 3 months Festus Aloe, MD [facility]           . metFORMIN (GLUCOPHAGE-XR) 750 MG 24 hr tablet [Pharmacy Med Name: METFORMIN HCL ER 750 MG TABLET] 180 tablet 3    Sig: TAKE 2 TABLETS (1,500 MG TOTAL) BY MOUTH DAILY.     Endocrinology:  Diabetes - Biguanides Failed - 03/03/2019 12:58 AM      Failed - Cr in normal range and within 360 days    Creat  Date Value Ref Range Status  12/21/2016 1.31 (H) 0.60 - 0.93 mg/dL Final    Comment:    For patients >71 years of age, the reference limit for Creatinine is approximately 13% higher for people identified as African-American. .    Creatinine, Ser  Date Value Ref Range Status  09/27/2018 1.67 (H) 0.57 - 1.00 mg/dL Final   Creatinine, POC  Date Value Ref Range Status  06/22/2016 n/a mg/dL Final         Failed - eGFR in normal range and within 360 days    GFR, Est African American  Date Value Ref Range Status  12/21/2016 47 (L) > OR = 60 mL/min/1.39m Final   GFR calc Af Amer  Date Value Ref Range Status  09/27/2018 34 (L) >59 mL/min/1.73 Final   GFR, Est Non African American  Date Value  Ref Range Status  12/21/2016 41 (L) > OR = 60 mL/min/1.72m Final   GFR calc non Af Amer  Date Value Ref Range Status  09/27/2018 30 (L) >59 mL/min/1.73 Final         Passed - HBA1C is between 0 and 7.9 and within 180 days    Hgb A1c MFr Bld  Date Value Ref Range Status  09/27/2018 7.0 (H) 4.8 - 5.6 % Final    Comment:             Prediabetes: 5.7 - 6.4          Diabetes: >6.4          Glycemic control for adults with diabetes: <7.0          Passed - Valid encounter within last 6 months    Recent Outpatient Visits          5 months ago [facility]   [facility]   1 year [facility], unspecified [facility]   [facility]   1 year [facility]   [facility]   1 year ago Diabetes mellitus with nephropathy ([facility]   [facility]      Future Appointments            In 3 weeks  Fisher, [facility]irstie Peri MD [facility] [place]  In 1 month End, CHarrell Gave MD [facility] [place] [place]  In 3 months EFestus Aloe MD [facility]

## 2019-03-27 NOTE — Progress Notes (Signed)
Patient: [name]   DOB: [birth year]   74 y.o. Female  MRN: [id number] Visit Date: 03/28/2019  Today's Provider: Lelon Huh, MD  Subjective:    Chief Complaint  Patient presents with  . Diabetes  . Hypertension   HPI  Diabetes Mellitus Type II, Follow-up:   Lab Results  Component Value Date   HGBA1C 6.6 (A) 03/28/2019   HGBA1C 7.0 (H) 09/27/2018   HGBA1C 7.1 (A) 05/31/2018    Last seen for diabetes 6 months ago.  Management since then includes changing to immediate release Metformin due to recall. She reports good compliance with treatment. She is not having side effects.  Current symptoms include leg swelling  Home blood sugar records: fasting range: pt not checking  Episodes of hypoglycemia? Unknown   Current insulin regiment: Is not on insulin Most Recent Eye Exam: 06/27/2018 Weight trend: decreasing Prior visit with dietician: No Current exercise: none Current diet habits: on average, 3 meals per day  Pertinent Labs:    Component Value Date/Time   CHOL 151 09/27/2018 1056   TRIG 105 09/27/2018 1056   HDL 48 09/27/2018 1056   LDLCALC 84 09/27/2018 1056   CREATININE 1.67 (H) 09/27/2018 1056   CREATININE 1.31 (H) 12/21/2016 1221    Wt Readings from Last 3 Encounters:  03/28/19 203 lb (92.1 kg)  12/18/18 216 lb (98 kg)  10/09/18 216 lb 12 oz (98.3 kg)    ------------------------------------------------------------------------   Hypertension, follow-up:  BP Readings from Last 3 Encounters:  03/28/19 95/60  12/18/18 140/80  10/09/18 126/78    She was last seen for hypertension 6 months ago.  BP at that visit was 110/62. Management since that visit includes no changes. She reports good compliance with treatment. She is not having side effects.  She is not exercising. She is adherent to low salt diet.   Outside blood pressures are not being checked at home. She is experiencing lower extremity edema.  Patient denies chest pain,  chest pressure/discomfort, irregular heart beat and palpitations.   Cardiovascular risk factors include advanced age (older than 65 for men, 28 for women), diabetes mellitus and hypertension.  Use of agents associated with hypertension: NSAIDS.     Weight trend: decreasing Wt Readings from Last 3 Encounters:  03/28/19 203 lb (92.1 kg)  12/18/18 216 lb (98 kg)  10/09/18 216 lb 12 oz (98.3 kg)    Current diet: on average, 3 meals per day  ------------------------------------------------------------------------  Her only concern today is worsening swelling in her legs. Breathing has been fine. Urinary incontinence has greatly improved on oxybutynin.   Past Medical History:  Diagnosis Date  . Aortic stenosis   . Asthma   . Coronary artery disease   . Diabetes mellitus without complication ([place])   . History of measles   . Hypertension   . Nonischemic cardiomyopathy ([place])   . Pulmonary hypertension (HCC)      Medications: Outpatient Medications Prior to Visit  Medication Sig Dispense Refill  . aspirin EC 81 MG tablet Take 1 tablet (81 mg total) by mouth daily. 90 tablet 3  . Cholecalciferol (VITAMIN D) 50 MCG (2000 UT) tablet Take 2,000 Units by mouth daily.    . cyanocobalamin 500 MCG tablet Take 250 mcg by mouth daily.     . furosemide (LASIX) 40 MG tablet TAKE 1 TABLET BY MOUTH EVERY DAY 90 tablet 3  . HYDROcodone-acetaminophen (NORCO) 10-325 MG tablet Take 1 tablet by mouth every 8 (  eight) hours as needed. 90 tablet 0  . lisinopril (ZESTRIL) 20 MG tablet Take 1 tablet (20 mg total) by mouth daily. 90 tablet 3  . lovastatin (MEVACOR) 20 MG tablet TAKE 1 TABLET BY MOUTH EVERYDAY AT BEDTIME 90 tablet 4  . meloxicam (MOBIC) 15 MG tablet TAKE 1 TABLET (15 MG TOTAL) BY MOUTH DAILY AS NEEDED FOR PAIN. TAKE WITH FOOD 90 tablet 3  . metFORMIN (GLUCOPHAGE) 500 MG tablet Take 1 tablet (500 mg total) by mouth 2 (two) times daily with a meal. 180 tablet 3  . metoprolol tartrate (LOPRESSOR)  50 MG tablet Take 1 tablet (50 mg total) by mouth 2 (two) times daily. 180 tablet 3  . oxybutynin (DITROPAN) 5 MG tablet Take 1 tablet (5 mg total) by mouth 2 (two) times daily. 180 tablet 3  . spironolactone (ALDACTONE) 25 MG tablet Take 1 tablet (25 mg total) by mouth daily. 90 tablet 3  . traZODone (DESYREL) 100 MG tablet TAKE 1 TABLET BY MOUTH EVERY NIGHT AT BEDTIME AS NEEDED FOR INSOMNIA 90 tablet 4  . amLODipine (NORVASC) 5 MG tablet TAKE 1 TABLET BY MOUTH EVERY DAY 90 tablet 3  . tolterodine (DETROL) 2 MG tablet Take 1 tablet (2 mg total) by mouth 2 (two) times daily. (Patient not taking: Reported on 03/28/2019) 60 tablet 3   No facility-administered medications prior to visit.    Review of Systems  Constitutional: Negative for appetite change, chills, fatigue and fever.  Respiratory: Negative for chest tightness and shortness of breath.   Cardiovascular: Positive for leg swelling. Negative for chest pain and palpitations.  Gastrointestinal: Negative for abdominal pain, nausea and vomiting.  Neurological: Negative for dizziness and weakness.    Lab Results  Component Value Date   WBC 5.6 09/27/2018   HGB 9.8 (L) 09/27/2018   HCT 30.5 (L) 09/27/2018   MCV 93 09/27/2018   PLT 252 09/27/2018        Objective:   BP 95/60 (BP Location: Left Arm, Patient Position: Sitting, Cuff Size: Large)   Pulse (!) 57   Temp (!) 96.2 F (35.7 C) (Temporal)   Wt 203 lb (92.1 kg)   BMI 35.96 kg/m  Vitals:   03/28/19 1058  BP: 95/60  Pulse: (!) 57  Temp: (!) 96.2 F (35.7 C)  TempSrc: Temporal  Weight: 203 lb (92.1 kg)   BP Readings from Last 3 Encounters:  03/28/19 95/60  12/18/18 140/80  10/09/18 126/78    Physical Exam    General: Appearance:    Obese female in no acute distress  Eyes:    PERRL, conjunctiva/corneas clear, EOM's intact       Lungs:     Clear to auscultation bilaterally, respirations unlabored  Heart:    Bradycardic. Normal rhythm.  2/6 harsh,  crescendo-decrescendo, systolic murmur at right upper sternal border  MS:   All extremities are intact. 3+ bipedal foot and ankle edema.   Neurologic:   Awake, alert, oriented x 3. No apparent focal neurological           defect.        Results for orders placed or performed in visit on 03/28/19  POCT HgB A1C  Result Value Ref Range   Hemoglobin A1C 6.6 (A) 4.0 - 5.6 %   Est. average glucose Bld gHb Est-mCnc 143       Assessment & Plan:    1. Diabetes mellitus with nephropathy ([place]) Very well controlled. Continue current medications.   - Comprehensive metabolic  panel - CBC  2. Chronic congestive heart failure, unspecified heart failure type (HCC) Stable swelling a little worse but no change in dyspnea. She has follow up with Dr. Saunders Revel in a few weeks.  - Comprehensive metabolic panel - CBC  3. Atherosclerosis of aorta (HCC) Asymptomatic. Compliant with medication.  Continue aggressive risk factor modification.    4. Morbid obesity ([place])   5. Edema, unspecified type BP is very low and her husband reports similar blood pressure readings at home. Will reduce - amLODipine (NORVASC) 5 MG tablet; to 0.5 tablets (2.5 mg total) by mouth daily, and to let me know if BP rises over 120.   Follow up 6 months for diabetes   The entirety of the information documented in the History of Present Illness, Review of Systems and Physical Exam were personally obtained by me. Portions of this information were initially documented by Idelle Jo, CMA and reviewed by me for thoroughness and accuracy.    Lelon Huh, MD  [facility] [phone] (phone) [phone] (fax)  [place]

## 2019-03-28 ENCOUNTER — Ambulatory Visit (INDEPENDENT_AMBULATORY_CARE_PROVIDER_SITE_OTHER): Payer: Medicare Other | Admitting: Family Medicine

## 2019-03-28 ENCOUNTER — Encounter: Payer: Self-pay | Admitting: Family Medicine

## 2019-03-28 ENCOUNTER — Other Ambulatory Visit: Payer: Self-pay

## 2019-03-28 VITALS — BP 95/60 | HR 57 | Temp 96.2°F | Wt 203.0 lb

## 2019-03-28 DIAGNOSIS — I509 Heart failure, unspecified: Secondary | ICD-10-CM | POA: Diagnosis not present

## 2019-03-28 DIAGNOSIS — R609 Edema, unspecified: Secondary | ICD-10-CM | POA: Diagnosis not present

## 2019-03-28 DIAGNOSIS — I700 Atherosclerosis of aorta: Secondary | ICD-10-CM

## 2019-03-28 DIAGNOSIS — E1121 Type 2 diabetes mellitus with diabetic nephropathy: Secondary | ICD-10-CM | POA: Diagnosis not present

## 2019-03-28 LAB — POCT GLYCOSYLATED HEMOGLOBIN (HGB A1C)
Est. average glucose Bld gHb Est-mCnc: 143
Hemoglobin A1C: 6.6 % — AB (ref 4.0–5.6)

## 2019-03-28 MED ORDER — AMLODIPINE BESYLATE 5 MG PO TABS: 2.5000 mg | ORAL_TABLET | Freq: Every day | ORAL | Status: DC

## 2019-03-28 NOTE — Patient Instructions (Signed)
.   Please review the attached list of medications and notify my office if there are any errors.   . Please bring all of your medications to every appointment so we can make sure that our medication list is the same as yours.    You can start cutting the amlodipine to 1/2 tablet a day to see if that helps with the swelling. Let me know if your blood pressure gets over 120.

## 2019-03-29 LAB — CBC
Hematocrit: 28.4 % — ABNORMAL LOW (ref 34.0–46.6)
Hemoglobin: 9 g/dL — ABNORMAL LOW (ref 11.1–15.9)
MCH: 28.9 pg (ref 26.6–33.0)
MCHC: 31.7 g/dL (ref 31.5–35.7)
MCV: 91 fL (ref 79–97)
Platelets: 301 10*3/uL (ref 150–450)
RBC: 3.11 x10E6/uL — ABNORMAL LOW (ref 3.77–5.28)
RDW: 13.4 % (ref 11.7–15.4)
WBC: 6.9 10*3/uL (ref 3.4–10.8)

## 2019-03-29 LAB — COMPREHENSIVE METABOLIC PANEL
ALT: 6 IU/L (ref 0–32)
AST: 9 IU/L (ref 0–40)
Albumin/Globulin Ratio: 0.9 — ABNORMAL LOW (ref 1.2–2.2)
Albumin: 3.7 g/dL (ref 3.7–4.7)
Alkaline Phosphatase: 69 IU/L (ref 39–117)
BUN/Creatinine Ratio: 15 (ref 12–28)
BUN: 36 mg/dL — ABNORMAL HIGH (ref 8–27)
Bilirubin Total: 0.2 mg/dL (ref 0.0–1.2)
CO2: 23 mmol/L (ref 20–29)
Calcium: 10 mg/dL (ref 8.7–10.3)
Chloride: 102 mmol/L (ref 96–106)
Creatinine, Ser: 2.44 mg/dL — ABNORMAL HIGH (ref 0.57–1.00)
GFR calc Af Amer: 22 mL/min/{1.73_m2} — ABNORMAL LOW (ref >59)
GFR calc non Af Amer: 19 mL/min/{1.73_m2} — ABNORMAL LOW (ref >59)
Globulin, Total: 3.9 g/dL (ref 1.5–4.5)
Glucose: 93 mg/dL (ref 65–99)
Potassium: 5 mmol/L (ref 3.5–5.2)
Sodium: 139 mmol/L (ref 134–144)
Total Protein: 7.6 g/dL (ref 6.0–8.5)

## 2019-04-02 DIAGNOSIS — E113393 Type 2 diabetes mellitus with moderate nonproliferative diabetic retinopathy without macular edema, bilateral: Secondary | ICD-10-CM | POA: Diagnosis not present

## 2019-04-02 DIAGNOSIS — H2513 Age-related nuclear cataract, bilateral: Secondary | ICD-10-CM | POA: Diagnosis not present

## 2019-04-10 ENCOUNTER — Other Ambulatory Visit: Payer: Self-pay

## 2019-04-10 ENCOUNTER — Telehealth: Payer: Self-pay | Admitting: *Deleted

## 2019-04-10 ENCOUNTER — Ambulatory Visit: Payer: Medicare Other | Admitting: Internal Medicine

## 2019-04-10 ENCOUNTER — Encounter: Payer: Self-pay | Admitting: Internal Medicine

## 2019-04-10 VITALS — BP 104/64 | HR 63 | Ht 64.0 in | Wt 205.4 lb

## 2019-04-10 DIAGNOSIS — I10 Essential (primary) hypertension: Secondary | ICD-10-CM | POA: Diagnosis not present

## 2019-04-10 DIAGNOSIS — R600 Localized edema: Secondary | ICD-10-CM

## 2019-04-10 DIAGNOSIS — S81802A Unspecified open wound, left lower leg, initial encounter: Secondary | ICD-10-CM | POA: Diagnosis not present

## 2019-04-10 DIAGNOSIS — N179 Acute kidney failure, unspecified: Secondary | ICD-10-CM | POA: Diagnosis not present

## 2019-04-10 DIAGNOSIS — N189 Chronic kidney disease, unspecified: Secondary | ICD-10-CM

## 2019-04-10 DIAGNOSIS — I350 Nonrheumatic aortic (valve) stenosis: Secondary | ICD-10-CM

## 2019-04-10 DIAGNOSIS — I5042 Chronic combined systolic (congestive) and diastolic (congestive) heart failure: Secondary | ICD-10-CM

## 2019-04-10 DIAGNOSIS — N184 Chronic kidney disease, stage 4 (severe): Secondary | ICD-10-CM | POA: Insufficient documentation

## 2019-04-10 DIAGNOSIS — S81809A Unspecified open wound, unspecified lower leg, initial encounter: Secondary | ICD-10-CM

## 2019-04-10 DIAGNOSIS — S81801A Unspecified open wound, right lower leg, initial encounter: Secondary | ICD-10-CM

## 2019-04-10 NOTE — Progress Notes (Signed)
Follow-up Outpatient Visit Date: 04/10/2019  Primary Care Provider: Birdie Sons, MD [address] [place] [address]  Chief Complaint: Leg swelling  HPI:  Ms. Hyder is a 74 y.o. female with history of non-obstructive CAD, NICM with chronic systolic and diastolic heart failure, HTN, DM, and multiple myeloma, who presents for follow-up of heart failure.  I last saw Ms. Baskett in 10/2018, at which time she was doing relatively well albeit with limited mobility.  We did not make any medication changes or pursue additional testing at that time.  Today, Ms. Baglio and her husband are concerned about progressive leg swelling over the last few months.  Her husband attributes this to noncompliance with leg elevation and compression stockings.  Ms. Bermingham has developed several wounds on her calves, which were previously treated by the wound care clinic.  Her husband has been applying topical treatments.  In an effort to improve edema, amlodipine was decreased from 5 mg daily to 2.5 mg daily at her last visit with Dr. Caryn Section in March.  Ms. Flynn notes slight increase in her chronic exertional dyspnea.  Orthopnea is unchanged, with Ms. Campoverde sleeping in a recliner at baseline.  She denies chest pain, palpitations, and lightheadedness.  She has been struggling with more frequent urinary incontinence and is scheduled to see urology in June.  --------------------------------------------------------------------------------------------------  Cardiovascular History & Procedures: Cardiovascular Problems:  Chronic systolic and diastolic heart failure secondary to non-ischemic cardiomyopathy  Nonobstructive coronary artery disease  Aortic stenosis  Risk Factors:  Known coronary artery disease, hypertension, hyperlipidemia, diabetes mellitus, sedentary lifestyle, obesity, and age > 58  Cath/PCI:  LHC/RHC (03/07/16): LMCA normal. LAD with 30% ostial stenosi, 30% midvessel narrowing, and  50% distal disease. LCx with 40% midvessel lesion. RCA with 30% proximal narrowing. LVEDP 26 mmHg. RA 18 (prominent "M" appearance), RV 82/21, PA mean 55, PCWP 25. Ao sat 96%, PA sat 54%, Fick Co/Ci 3.2/1.7. PVR 9.4 Wood units. No significant AoV gradient.  CV Surgery:  None  EP Procedures and Devices:  None  Non-Invasive Evaluation(s):  ABI's (07/26/16):Right:ABI notobtained, TBI 1.8. Left:ABI 1.9, TBI 1.0.Triphasic waveforms noted in both lower extremities.  TTE (07/26/16): Normal LV size with moderate LVH. LVEF 55-60% with normal wall motion. Grade 1 diastolic dysfunction. Mild aortic stenosis (mean gradient 11 mmHg). Mitral annular calcification with mild to moderate MR. Mild left atrial enlargement. Normal RV size and function. Normal PA pressure.  TTE (02/25/16): Normal LV size with moderate LVH. LVEF 25-30% with grade 2 diastolic dysfunction. Thickened aortic valve with likely moderate stenosis. MAC with mild MR. Mild LA enlargement. Mildly to moderately reduced RV contraction. Mild-moderate TR. Moderately PH (RVSP 55 mmHg).  Recent CV Pertinent Labs: Lab Results  Component Value Date   CHOL 151 09/27/2018   HDL 48 09/27/2018   LDLCALC 84 09/27/2018   TRIG 105 09/27/2018   CHOLHDL 3.1 09/27/2018   INR 1.0 03/02/2016   BNP 1,592.0 (H) 02/09/2016   K 5.0 03/28/2019   BUN 36 (H) 03/28/2019   CREATININE 2.44 (H) 03/28/2019   CREATININE 1.31 (H) 12/21/2016    Past medical and surgical history were reviewed and updated in EPIC.  Current Meds  Medication Sig  . aspirin EC 81 MG tablet Take 1 tablet (81 mg total) by mouth daily.  . Cholecalciferol (VITAMIN D) 50 MCG (2000 UT) tablet Take 2,000 Units by mouth daily.  . cyanocobalamin 500 MCG tablet Take 250 mcg by mouth daily.   . furosemide (LASIX)  40 MG tablet TAKE 1 TABLET BY MOUTH EVERY DAY  . HYDROcodone-acetaminophen (NORCO) 10-325 MG tablet Take 1 tablet by mouth every 8 (eight) hours as needed.  Marland Kitchen lisinopril  (ZESTRIL) 20 MG tablet Take 1 tablet (20 mg total) by mouth daily.  Marland Kitchen lovastatin (MEVACOR) 20 MG tablet TAKE 1 TABLET BY MOUTH EVERYDAY AT BEDTIME  . meloxicam (MOBIC) 15 MG tablet TAKE 1 TABLET (15 MG TOTAL) BY MOUTH DAILY AS NEEDED FOR PAIN. TAKE WITH FOOD  . metoprolol tartrate (LOPRESSOR) 50 MG tablet Take 1 tablet (50 mg total) by mouth 2 (two) times daily.  Marland Kitchen oxybutynin (DITROPAN) 5 MG tablet Take 1 tablet (5 mg total) by mouth 2 (two) times daily.  Marland Kitchen tolterodine (DETROL) 2 MG tablet Take 1 tablet (2 mg total) by mouth 2 (two) times daily.  . traZODone (DESYREL) 100 MG tablet TAKE 1 TABLET BY MOUTH EVERY NIGHT AT BEDTIME AS NEEDED FOR INSOMNIA  . [DISCONTINUED] amLODipine (NORVASC) 5 MG tablet Take 0.5 tablets (2.5 mg total) by mouth daily.  . [DISCONTINUED] metFORMIN (GLUCOPHAGE) 500 MG tablet Take 1 tablet (500 mg total) by mouth 2 (two) times daily with a meal. (Patient taking differently: Take 1,000 mg by mouth 2 (two) times daily with a meal. )  . [DISCONTINUED] spironolactone (ALDACTONE) 25 MG tablet Take 1 tablet (25 mg total) by mouth daily.    Allergies: Hydrochlorothiazide and Penicillins  Social History   Tobacco Use  . Smoking status: Never Smoker  . Smokeless tobacco: Never Used  Substance Use Topics  . Alcohol use: No  . Drug use: No    Family History  Problem Relation Age of Onset  . Cancer Mother        Uterine cancer  . Diabetes Mother   . Heart disease Mother   . Heart failure Mother   . Parkinson's disease Father   . Cancer Sister        breast cancer  . Diabetes Brother        Non-insulin Dependent Diabetes Mellitus  . Mesothelioma Brother   . Hypertension Sister   . Diabetes Sister        Non-insulin dependent Diabetes Mellitus    Review of Systems: A 12-system review of systems was performed and was negative except as noted in the  HPI.  --------------------------------------------------------------------------------------------------  Physical Exam: BP 104/64 (BP Location: Left Arm, Patient Position: Sitting, Cuff Size: Normal)   Pulse 63   Ht _0  (1.626 m)   Wt 205 lb 6 oz (93.2 kg)   BMI 35.25 kg/m   General: NAD.  Seated in a wheelchair.  Accompanied by her husband. HEENT: No conjunctival pallor or scleral icterus. Facemask in place. Neck: No significant JVD or HJR. Lungs: Normal work of breathing. Clear to auscultation bilaterally without wheezes or crackles. Heart: Regular rate and rhythm with 2/6 systolic murmur.  No rubs or gallops. Abd: Bowel sounds present. Soft, NT/ND. Ext: 1-2+ pretibial edema with mild pitting. Skin: Areas of superficial breakdown on both calves.  There is a dressing overlying the right shin.  EKG: Normal sinus rhythm with LVH and poor R wave progression.  R wave progression has worsened from prior tracing on 10/09/2018, which may reflect lead placement.  Lab Results  Component Value Date   WBC 6.9 03/28/2019   HGB 9.0 (L) 03/28/2019   HCT 28.4 (L) 03/28/2019   MCV 91 03/28/2019   PLT 301 03/28/2019    Lab Results  Component Value Date   NA  139 03/28/2019   K 5.0 03/28/2019   CL 102 03/28/2019   CO2 23 03/28/2019   BUN 36 (H) 03/28/2019   CREATININE 2.44 (H) 03/28/2019   GLUCOSE 93 03/28/2019   ALT 6 03/28/2019    Lab Results  Component Value Date   CHOL 151 09/27/2018   HDL 48 09/27/2018   LDLCALC 84 09/27/2018   TRIG 105 09/27/2018   CHOLHDL 3.1 09/27/2018    --------------------------------------------------------------------------------------------------  ASSESSMENT AND PLAN: Chronic heart failure with recovered ejection fraction: Ms. Grubb has worsening lower extremity edema but does not have significant JVD or weight gain.  I suspect her lower extremity edema is likely multifactorial but driven largely by venous insufficiency and noncompliance with  leg elevation and compression stocking use.  She does have a history of cardiomyopathy and valvular heart disease.  Recent labs obtained by Dr. Caryn Section are also notable for worsening renal function.  It is not clear that any intervention has been undertaken for this.  I have recommended discontinuation of spironolactone.  We will will recheck a basic metabolic panel today.  Based on the results, we may need to adjust furosemide dose.  We will continue metoprolol and lisinopril for now, as long as renal function is worsened.  I also think it would be reasonable to repeat an echocardiogram to ensure that Ms. Notarianni's EF has not declined and that her and aortic stenosis has not worsened.  I encouraged Ms. Blankenbaker to elevate her legs.  I will refer her to the wound care clinic for further evaluation and management of lower extremity wounds.  Acute kidney injury superimposed on chronic kidney disease: Labs obtained in late March by Dr. Caryn Section notable for creatinine of 2.4, increased from 1.7 in 09/2018.  Baseline creatinine appears to be around 1.3.  I have asked Ms. Schwegel to stop taking spironolactone and Metformin.  If creatinine has worsened further, we will need to consider stopping lisinopril and referring Ms. Cogan to nephrology.  Hypertension: Blood pressure is low normal today.  We have agreed to stop amlodipine and see if this is helps with her leg swelling as well.  We will also discontinue spironolactone in the setting of worsening renal function and borderline hypokalemia.  For now, we will current doses of furosemide, lisinopril, and even metoprolol, though I have a low threshold for stopping lisinopril if renal function has worsened further.  Type 2 diabetes mellitus: Unfortunately, given worsening renal function, we will need to discontinue Metformin at this time.  If repeat BMP today shows a creatinine is still significantly elevated, I will reach out to Dr. Caryn Section to have him alter Ms. Zook's  diabetes regimen.  Follow-up: Return to clinic in 2 weeks.  Nelva Bush, MD 04/10/2019 12:52 PM

## 2019-04-10 NOTE — Telephone Encounter (Signed)
-----   Message from Jillian Bush, MD sent at 04/10/2019 12:53 PM EDT ----- Lucie Leather we set Ms. Fraizer up for an echo to reassess her EF and aortic stenosis, given worsening leg edema and renal function?  It would be great if it could be done before her follow-up visit in 2 weeks, though this is not mandatory.  Thanks.Gerald Stabs

## 2019-04-10 NOTE — Telephone Encounter (Signed)
Routing to scheduling to contact patient to arrange appointment.

## 2019-04-10 NOTE — Patient Instructions (Addendum)
Medication Instructions:  Your physician has recommended you make the following change in your medication: 1- STOP Amlodipine. 2- STOP Spironolactone. 3- STOP Metformin.  *If you need a refill on your cardiac medications before your next appointment, please call your pharmacy*   Lab Work: Your physician recommends that you return for lab work in: [place].  If you have labs (blood work) drawn today and your tests are completely normal, you will receive your results only by: Marland Kitchen MyChart Message (if you have MyChart) OR . A paper copy in the mail If you have any lab test that is abnormal or we need to change your treatment, we will call you to review the results.   Testing/Procedures: none   Follow-Up: You have been referred to [facility]. Please call [phone] to schedule. [address] [place], [place] [address]   At [facility], you and your health needs are our priority.  As part of our continuing mission to provide you with exceptional heart care, we have created designated Provider Care Teams.  These Care Teams include your primary Cardiologist (physician) and Advanced Practice Providers (APPs -  Physician Assistants and Nurse Practitioners) who all work together to provide you with the care you need, when you need it.  We recommend signing up for the patient portal called "MyChart".  Sign up information is provided on this After Visit Summary.  MyChart is used to connect with patients for Virtual Visits (Telemedicine).  Patients are able to view lab/test results, encounter notes, upcoming appointments, etc.  Non-urgent messages can be sent to your provider as well.   To learn more about what you can do with MyChart, go to [email or web address].    Your next appointment:   2 week(s)  The format for your next appointment:   In Person  Provider:    You may see Nelva Bush, MD or one of the following Advanced Practice Providers on your  designated Care Team:    Murray Hodgkins, NP  Christell Faith, PA-C  Marrianne Mood, PA-C

## 2019-04-11 ENCOUNTER — Telehealth: Payer: Self-pay | Admitting: *Deleted

## 2019-04-11 DIAGNOSIS — N179 Acute kidney failure, unspecified: Secondary | ICD-10-CM

## 2019-04-11 DIAGNOSIS — Z79899 Other long term (current) drug therapy: Secondary | ICD-10-CM

## 2019-04-11 DIAGNOSIS — I5022 Chronic systolic (congestive) heart failure: Secondary | ICD-10-CM

## 2019-04-11 LAB — BASIC METABOLIC PANEL
BUN/Creatinine Ratio: 17 (ref 12–28)
BUN: 40 mg/dL — ABNORMAL HIGH (ref 8–27)
CO2: 22 mmol/L (ref 20–29)
Calcium: 10.2 mg/dL (ref 8.7–10.3)
Chloride: 102 mmol/L (ref 96–106)
Creatinine, Ser: 2.31 mg/dL — ABNORMAL HIGH (ref 0.57–1.00)
GFR calc Af Amer: 23 mL/min/{1.73_m2} — ABNORMAL LOW (ref >59)
GFR calc non Af Amer: 20 mL/min/{1.73_m2} — ABNORMAL LOW (ref >59)
Glucose: 98 mg/dL (ref 65–99)
Potassium: 5.3 mmol/L — ABNORMAL HIGH (ref 3.5–5.2)
Sodium: 140 mmol/L (ref 134–144)

## 2019-04-11 NOTE — Telephone Encounter (Signed)
No answer after several rings on both numbers listed.  No VM.

## 2019-04-11 NOTE — Telephone Encounter (Signed)
-----   Message from Nelva Bush, MD sent at 04/11/2019  7:02 AM EDT ----- Please let Ms. Denard know that her kidney function is similar to 2 weeks ago and significantly worse than her baseline last year.  Her potassium is also mildly elevated.  I suggest that she stop taking lisinopril in addition to the medications that we discussed yesterday.  She should also avoid NSAIDs such as meloxicam, ibuprofen, and naproxen.  We should repeat a basic metabolic panel in 1 week.  If renal function has not improved, we will need to consider referring her to nephrology.  Ms. Ewing should also touch base with her PCP, Dr. Caryn Section, regarding ongoing diabetes management, as metformin is contraindicated with advanced kidney disease.

## 2019-04-14 ENCOUNTER — Encounter: Payer: Medicare Other | Attending: Physician Assistant | Admitting: Physician Assistant

## 2019-04-14 ENCOUNTER — Other Ambulatory Visit: Payer: Self-pay

## 2019-04-14 DIAGNOSIS — Z888 Allergy status to other drugs, medicaments and biological substances status: Secondary | ICD-10-CM | POA: Diagnosis not present

## 2019-04-14 DIAGNOSIS — L97212 Non-pressure chronic ulcer of right calf with fat layer exposed: Secondary | ICD-10-CM | POA: Diagnosis not present

## 2019-04-14 DIAGNOSIS — L97222 Non-pressure chronic ulcer of left calf with fat layer exposed: Secondary | ICD-10-CM | POA: Diagnosis not present

## 2019-04-14 DIAGNOSIS — I890 Lymphedema, not elsewhere classified: Secondary | ICD-10-CM | POA: Insufficient documentation

## 2019-04-14 DIAGNOSIS — Z993 Dependence on wheelchair: Secondary | ICD-10-CM | POA: Diagnosis not present

## 2019-04-14 DIAGNOSIS — E11622 Type 2 diabetes mellitus with other skin ulcer: Secondary | ICD-10-CM | POA: Diagnosis not present

## 2019-04-14 DIAGNOSIS — Z880 Allergy status to penicillin: Secondary | ICD-10-CM | POA: Diagnosis not present

## 2019-04-14 DIAGNOSIS — Z7984 Long term (current) use of oral hypoglycemic drugs: Secondary | ICD-10-CM | POA: Diagnosis not present

## 2019-04-14 NOTE — Telephone Encounter (Signed)
Echo appointment scheduled.

## 2019-04-14 NOTE — Progress Notes (Addendum)
MARSELA, KUAN ([id number]) Visit Report for 04/14/2019 Chief Complaint Document Details Patient Name: [name], [name]. Date of Service: 04/14/2019 1:00 PM Medical Record Number: [id number] Patient Account Number: [id number] Date of Birth/Sex: 12-31-1945 (74 y.o. F) Treating RN: Army Melia Primary Care Provider: Lelon Huh Other Clinician: Referring Provider: Lelon Huh Treating Provider/Extender: Melburn Hake, Jenipher Havel Weeks in Treatment: 0 Information Obtained from: Patient Chief Complaint Patient seen for complaints of Non-Healing Wounds to both lower extremities Electronic Signature(s) Signed: 04/14/2019 1:54:31 PM By: Worthy Keeler PA-C Previous Signature: 04/14/2019 12:57:39 PM Version By: Worthy Keeler PA-C Entered By: Worthy Keeler on 04/14/2019 13:54:31 Delisi, Jeanice Lim ([id number]) -------------------------------------------------------------------------------- HPI Details Patient Name: [name]. Date of Service: 04/14/2019 1:00 PM Medical Record Number: [id number] Patient Account Number: [id number] Date of Birth/Sex: May 14, 1945 (74 y.o. F) Treating RN: Army Melia Primary Care Provider: Lelon Huh Other Clinician: Referring Provider: Lelon Huh Treating Provider/Extender: Melburn Hake, Brielle Moro Weeks in Treatment: 0 History of Present Illness Location: both lower extremity swelling with ulceration HPI Description: 74 year old patient who sees her PCP Dr. Lelon Huh was recently evaluated 10 days ago for diabetes mellitus, hypertension, CHF and hyperlipidemia. she also was noted to have ulcerations develop in her legs and she has been applying Silvadene dressings locally. In the past she has refused wound care referrals.her cardiologist Dr. Saunders Revel saw her and put her on 40 mg of furosemide daily. last hemoglobin A1c was 7.7%. she was also placed on ciprofloxacin twice daily for 7 days and a urine culture was recommended. past medical history significant for  coronary artery disease, diabetes mellitus, nonischemic cardiomyopathy and pulmonary hypertension. She is also status post heart catheterization and coronary angiography, tubal ligation and breast cyst removal in the past. She has never been a smoker. Patient had arterial studies done which showed bilateral ABIs are artificially elevated due to noncompressible and calcified vessels. Triphasic waveform throughout. Right great toe TBI is elevated while the left is normal. 10/23/2016 -- the patient is rather moribund from several issues including chronic back pain and knee pain and swelling of her legs. The large necrotic area on her left lateral anterior calf was bleeding on touch after washing her leg. There was a spot which needed silver nitrate cauterization and this was done appropriately. 11/06/2016 -- she is again noted to have friable bleeding from the left lower extremity wounds and this again had to be cauterized with silver nitrate to control the bleeding as pressure itself would not do it. 11/20/2016 -- the right lower extremity is completely healed and we have ordered 30-40 mm compression stocking's in both the dural layer and also a pair of juxta lites. 12/18/2016 -- she has not been wearing her compression stockings on her right leg and these have opened out into ulcerations again. Her left lower extremity has circumferential ulcerations now. I believe at this stage I would like to get her venous reflux studies done to make certain that there is no fixable superficial venous reflux. 12/29/2016 -- she has made an excellent recovery having continued with her appropriate doses of diuretics and elevation and exercise. She has not yet received her right lower extremity juxta lites. Her venous duplex study is at the end of January 01/13/16 on evaluation today patient's wounds appeared to be overall doing much better much less hyper granular than previous week's evaluation. The Hydrofera Blue  Dressing's to be doing very well. She does tell me that she is having a little bit more discomfort in  the posterior aspect of her leg where she has a wound at this point. Fortunately there appears to be no infection. 01/19/17 on evaluation today patient appears to be doing very well in regard to her bilateral lower extremity swelling and at this point in time her left lower extremity ulcers. Her wounds appear to be doing much better. She has been tolerating the dressing's at this point fortunately she did get the Juxta-Lite compression as of today as well. Nonetheless I am pleased that she has been tolerating everything so well and that her wounds looks so good. In fact she is an excellent granular surface no evidence of slough covering and I do not see any reason for likely debridement today. Especially in regard to the posterior leg. 01/26/17 on evaluation today patient appears to be doing decently well in regard to her wounds. The surface of the majority of her wounds is greatly improved. She has been using the Juxta-Lite compression which is excellent. Nonetheless the wound of the left posterior calf did require some debridement today otherwise the majority of the wounds did not require any debridement. The this was due to slough buildup on the surface. She also has a small skin flap where two of the ulcers actually connected and this has loosened up I'm concerned that things may not heal well with that flap but for the time being I'm not gonna do anything different in that regard. 02/23/17 on evaluation today patient's wound actually appears to be doing fairly well in regard to the remaining left lateral ulcer which we have been treated. With that being said it does appear that her wrap actually slid down the wood bit over the past few days at least that there's actually some friction injury and blistering noted on the medial aspect of her malleolus of the left lower extremity. She continues to use  the Juxta-Lite for the right lower extremity. With that being said no debridement is necessary today and these are very superficial injuries as far as the new injuries are concerned. 03/02/17 on evaluation today patient appears to be doing excellent in regard to her left lateral lower from the ulcers. In fact it appears that she is almost completely healed in regard to the ulcer we have been following and the new injuries which were caused by the wraps slipping down last week have been completely resolved which is excellent news. Overall I'm definitely pleased with the progress that she has made. Fortunately the rat did not cause any irritation as it did previously with the Unna wrap. 03/09/17 on evaluation today patient's wound appears to be completely healed. Obviously this is great news. She and her husband both are extremely pleased and excited to finally have this gone. Obviously she has been dealing with this wound for quite some time. Readmission: LORREE, MILLAR ([id number]) 04/14/2019 upon evaluation today patient appears for reevaluation here in our clinic although it has been since March 2019 since I last saw her. She unfortunately is having issues similar to what she had previous which are blisters and ulcerations of the bilateral lower extremities. Fortunately there is no evidence of active infection at this time based on what I am seeing. Unfortunately she is still having a lot of drainage and weeping. I do believe she needs to be compression wrap in order to get this under control and she is previously tolerated a 3 layer compression wrap without any complications whatsoever. No fevers, chills, nausea, vomiting, or diarrhea. Electronic Signature(s) Signed: 04/14/2019  5:36:24 PM By: Worthy Keeler PA-C Entered By: Worthy Keeler on 04/14/2019 17:36:24 [name] ([id number]) -------------------------------------------------------------------------------- Physical Exam  Details Patient Name: NATLIE, ASFOUR Watson. Date of Service: 04/14/2019 1:00 PM Medical Record Number: [id number] Patient Account Number: [id number] Date of Birth/Sex: Sep 07, 1945 (74 y.o. F) Treating RN: Army Melia Primary Care Provider: Lelon Huh Other Clinician: Referring Provider: Lelon Huh Treating Provider/Extender: Melburn Hake, Marshelle Bilger Weeks in Treatment: 0 Constitutional patient is hypertensive.. pulse regular and within target range for patient.Marland Kitchen respirations regular, non-labored and within target range for patient.Marland Kitchen temperature within target range for patient.. Well-nourished and well-hydrated in no acute distress. Eyes conjunctiva clear no eyelid edema noted. pupils equal round and reactive to light and accommodation. Ears, Nose, Mouth, and Throat no gross abnormality of ear auricles or external auditory canals. normal hearing noted during conversation. mucus membranes moist. Respiratory normal breathing without difficulty. Psychiatric Patient is not able to cooperate in decision making regarding care Completely.. Patient is oriented to person and place But does appear to have some degree of dementia.. pleasant and cooperative. Notes Upon inspection today patient had fairly superficial ulcerations of the bilateral lower extremities in the calf region which do not appear to be showing any signs of active infection at this time which is great news. Fortunately there are no signs of active infection at this time which is also great news. No fevers, chills, nausea, vomiting, or diarrhea. Electronic Signature(s) Signed: 04/14/2019 5:47:04 PM By: Worthy Keeler PA-C Entered By: Worthy Keeler on 04/14/2019 17:47:04 [name] ([id number]) -------------------------------------------------------------------------------- Physician Orders Details Patient Name: [name] Date of Service: 04/14/2019 1:00 PM Medical Record Number: [id number] Patient Account Number:  [id number] Date of Birth/Sex: 1945/10/23 (74 y.o. F) Treating RN: Army Melia Primary Care Provider: Lelon Huh Other Clinician: Referring Provider: Lelon Huh Treating Provider/Extender: Melburn Hake, Greyson Peavy Weeks in Treatment: 0 Verbal / Phone Orders: No Diagnosis Coding ICD-10 Coding Code Description E11.622 Type 2 diabetes mellitus with other skin ulcer I89.0 Lymphedema, not elsewhere classified L97.212 Non-pressure chronic ulcer of right calf with fat layer exposed L97.222 Non-pressure chronic ulcer of left calf with fat layer exposed Wound Cleansing Wound #14 Right,Anterior Lower Leg o Dial antibacterial soap, wash wounds, rinse and pat dry prior to dressing wounds - HH nurse and in office o May shower with protection. - Do not get wraps wet Wound #15 Right,Lateral Lower Leg o Dial antibacterial soap, wash wounds, rinse and pat dry prior to dressing wounds - HH nurse and in office o May shower with protection. - Do not get wraps wet Wound #16 Left,Medial Lower Leg o Dial antibacterial soap, wash wounds, rinse and pat dry prior to dressing wounds - HH nurse and in office o May shower with protection. - Do not get wraps wet Wound #17 Left,Anterior Lower Leg o Dial antibacterial soap, wash wounds, rinse and pat dry prior to dressing wounds - HH nurse and in office o May shower with protection. - Do not get wraps wet Wound #18 Left,Lateral Lower Leg o Dial antibacterial soap, wash wounds, rinse and pat dry prior to dressing wounds - HH nurse and in office o May shower with protection. - Do not get wraps wet Primary Wound Dressing Wound #14 Right,Anterior Lower Leg o Silver Alginate Wound #15 Right,Lateral Lower Leg o Silver Alginate Wound #16 Left,Medial Lower Leg o Silver Alginate Wound #17 Left,Anterior Lower Leg o Silver Alginate Wound #18 Left,Lateral Lower Leg o Silver Alginate Secondary Dressing Wound #14 Right,Anterior Lower  Leg o  XtraSorb Wound #15 Right,Lateral Lower Leg o XtraSorb Wound #16 Left,Medial Lower Leg o [address], [place] Watson. ([id number]) Wound #17 Left,Anterior Lower Leg o XtraSorb Wound #18 Left,Lateral Lower Leg o XtraSorb Dressing Change Frequency Wound #14 Right,Anterior Lower Leg o Change Dressing Monday, Wednesday, Friday - To be changed by [facility] until next visit Wound #15 Right,Lateral Lower Leg o Change Dressing Monday, Wednesday, Friday - To be changed by [facility] until next visit Wound #16 Left,Medial Lower Leg o Change Dressing Monday, Wednesday, Friday - To be changed by [facility] until next visit Wound #17 Left,Anterior Lower Leg o Change Dressing Monday, Wednesday, Friday - To be changed by [facility] until next visit Wound #18 Left,Lateral Lower Leg o Change Dressing Monday, Wednesday, Friday - To be changed by [facility] until next visit Follow-up Appointments Wound #14 Right,Anterior Lower Leg o Return Appointment in 2 weeks. Wound #15 Right,Lateral Lower Leg o Return Appointment in 2 weeks. Wound #16 Left,Medial Lower Leg o Return Appointment in 2 weeks. Wound #17 Left,Anterior Lower Leg o Return Appointment in 2 weeks. Wound #18 Left,Lateral Lower Leg o Return Appointment in 2 weeks. Edema Control Wound #14 Right,Anterior Lower Leg o 3 Layer Compression System - Bilateral Wound #15 Right,Lateral Lower Leg o 3 Layer Compression System - Bilateral Wound #16 Left,Medial Lower Leg o 3 Layer Compression System - Bilateral Wound #17 Left,Anterior Lower Leg o 3 Layer Compression System - Bilateral Wound #18 Left,Lateral Lower Leg o 3 Layer Compression System - Bilateral Home Health Wound #14 Right,Anterior Lower Leg o [place] for [place] Nurse may visit PRN to address patientos wound care needs. o FACE TO FACE ENCOUNTER: MEDICARE and MEDICAID PATIENTS: I certify that this patient is under my care and that I had a  face-to- face encounter that meets the physician face-to-face encounter requirements with this patient on this date. The encounter with the patient was in whole or in part for the following MEDICAL CONDITION: (primary reason for [place]) MEDICAL NECESSITY: I certify, that based on my findings, NURSING services are a medically necessary home health service. HOME BOUND STATUS: I certify that my clinical findings support that this patient is homebound (i.e., Due to illness or injury, pt requires aid of supportive devices such as crutches, cane, wheelchairs, walkers, the use of special transportation or the assistance of another person to leave their place of residence. There is a normal inability to leave the home and doing so requires considerable and taxing effort. Other absences are for medical reasons / religious services and are infrequent or of short duration when for other reasons). ASHYRA, CANTIN ([id number]) o If current dressing causes regression in wound condition, may D/C ordered dressing product/s and apply Normal Saline Moist Dressing daily until next [place] / Other MD appointment. [place] of regression in wound condition at [phone]. o Please direct any NON-WOUND related issues/requests for orders to patient's Primary Care Physician Wound #15 [place] for [place] Nurse may visit PRN to address patientos wound care needs. o FACE TO FACE ENCOUNTER: MEDICARE and MEDICAID PATIENTS: I certify that this patient is under my care and that I had a face-to- face encounter that meets the physician face-to-face encounter requirements with this patient on this date. The encounter with the patient was in whole or in part for the following MEDICAL CONDITION: (primary reason for [place]) MEDICAL NECESSITY: I certify, that  based on my findings, NURSING services are a medically  necessary home health service. HOME BOUND STATUS: I certify that my clinical findings support that this patient is homebound (i.e., Due to illness or injury, pt requires aid of supportive devices such as crutches, cane, wheelchairs, walkers, the use of special transportation or the assistance of another person to leave their place of residence. There is a normal inability to leave the home and doing so requires considerable and taxing effort. Other absences are for medical reasons / religious services and are infrequent or of short duration when for other reasons). o If current dressing causes regression in wound condition, may D/C ordered dressing product/s and apply Normal Saline Moist Dressing daily until next [place] / Other MD appointment. [place] of regression in wound condition at [phone]. o Please direct any NON-WOUND related issues/requests for orders to patient's Primary Care Physician Wound #16 [place] for [place] Nurse may visit PRN to address patientos wound care needs. o FACE TO FACE ENCOUNTER: MEDICARE and MEDICAID PATIENTS: I certify that this patient is under my care and that I had a face-to- face encounter that meets the physician face-to-face encounter requirements with this patient on this date. The encounter with the patient was in whole or in part for the following MEDICAL CONDITION: (primary reason for [place]) MEDICAL NECESSITY: I certify, that based on my findings, NURSING services are a medically necessary home health service. HOME BOUND STATUS: I certify that my clinical findings support that this patient is homebound (i.e., Due to illness or injury, pt requires aid of supportive devices such as crutches, cane, wheelchairs, walkers, the use of special transportation or the assistance of another person to leave their place of residence. There is a normal  inability to leave the home and doing so requires considerable and taxing effort. Other absences are for medical reasons / religious services and are infrequent or of short duration when for other reasons). o If current dressing causes regression in wound condition, may D/C ordered dressing product/s and apply Normal Saline Moist Dressing daily until next [place] / Other MD appointment. [place] of regression in wound condition at [phone]. o Please direct any NON-WOUND related issues/requests for orders to patient's Primary Care Physician Wound #17 [place] for [place] Nurse may visit PRN to address patientos wound care needs. o FACE TO FACE ENCOUNTER: MEDICARE and MEDICAID PATIENTS: I certify that this patient is under my care and that I had a face-to- face encounter that meets the physician face-to-face encounter requirements with this patient on this date. The encounter with the patient was in whole or in part for the following MEDICAL CONDITION: (primary reason for [place]) MEDICAL NECESSITY: I certify, that based on my findings, NURSING services are a medically necessary home health service. HOME BOUND STATUS: I certify that my clinical findings support that this patient is homebound (i.e., Due to illness or injury, pt requires aid of supportive devices such as crutches, cane, wheelchairs, walkers, the use of special transportation or the assistance of another person to leave their place of residence. There is a normal inability to leave the home and doing so requires considerable and taxing effort. Other absences are for medical reasons / religious services and are infrequent or of short duration when for other reasons). o If current dressing causes regression in wound  condition, may D/C ordered dressing product/s and apply Normal Saline Moist Dressing daily until next [place] / Other MD appointment. [place] of regression in wound condition at [phone]. o Please direct any NON-WOUND related issues/requests for orders to patient's Primary Care Physician Wound #18 [place] for [place] Nurse may visit PRN to address patientos wound care needs. o FACE TO FACE ENCOUNTER: MEDICARE and MEDICAID PATIENTS: I certify that this patient is under my care and that I had a face-to- face encounter that meets the physician face-to-face encounter requirements with this patient on this date. The encounter with the patient was in whole or in part for the following MEDICAL CONDITION: (primary reason for [place]) MEDICAL NECESSITY: I certify, that based on my findings, NURSING services are a medically necessary home health service. HOME BOUND STATUS: I certify that my clinical findings support that this patient is homebound (i.e., Due to illness or injury, pt requires aid of supportive devices such as crutches, cane, wheelchairs, walkers, the use of special transportation or the assistance of another person to leave their place of residence. There is a normal inability to leave the home and doing so requires considerable and taxing effort. Other absences are for medical reasons / religious services and are infrequent or of short duration when for other reasons). o If current dressing causes regression in wound condition, may D/C ordered dressing product/s and apply Normal Saline Moist Dressing daily until next [place] / Other MD appointment. [place] of regression in wound condition at [phone]. o Please direct any NON-WOUND related issues/requests for orders to patient's Primary Care Physician WINNIFRED, DUFFORD ([id number]) Electronic Signature(s) Signed: 04/14/2019 4:09:07 PM By: Army Melia Signed: 04/15/2019 6:10:11 PM By: Worthy Keeler  PA-C Entered By: Army Melia on 04/14/2019 13:53:54 [name] ([id number]) -------------------------------------------------------------------------------- Problem List Details Patient Name: [name], OQUENDO. Date of Service: 04/14/2019 1:00 PM Medical Record Number: [id number] Patient Account Number: [id number] Date of Birth/Sex: 12-05-1945 (74 y.o. F) Treating RN: Army Melia Primary Care Provider: Lelon Huh Other Clinician: Referring Provider: Lelon Huh Treating Provider/Extender: Melburn Hake, Marguita Venning Weeks in Treatment: 0 Active Problems ICD-10 Evaluated Encounter Code Description Active Date Today Diagnosis E11.622 Type 2 diabetes mellitus with other skin ulcer 04/14/2019 No Yes I89.0 Lymphedema, not elsewhere classified 04/14/2019 No Yes L97.212 Non-pressure chronic ulcer of right calf with fat layer exposed 04/14/2019 No Yes L97.222 Non-pressure chronic ulcer of left calf with fat layer exposed 04/14/2019 No Yes Inactive Problems Resolved Problems Electronic Signature(s) Signed: 04/14/2019 1:44:26 PM By: Worthy Keeler PA-C Previous Signature: 04/14/2019 12:57:32 PM Version By: Worthy Keeler PA-C Entered By: Worthy Keeler on 04/14/2019 13:44:26 Gora, Jeanice Lim ([id number]) -------------------------------------------------------------------------------- Progress Note Details Patient Name: [name]. Date of Service: 04/14/2019 1:00 PM Medical Record Number: [id number] Patient Account Number: [id number] Date of Birth/Sex: [birth year] (74 y.o. F) Treating RN: Army Melia Primary Care Provider: Lelon Huh Other Clinician: Referring Provider: Lelon Huh Treating Provider/Extender: Melburn Hake, Pamula Luther Weeks in Treatment: 0 Subjective Chief Complaint Information obtained from Patient Patient seen for complaints of Non-Healing Wounds to both lower extremities History of Present Illness (HPI) The following HPI elements were documented for the patient's  wound: Location: both lower extremity swelling with ulceration 74 year old patient who sees her PCP Dr. Lelon Huh was recently evaluated 10 days ago for diabetes mellitus, hypertension, CHF and hyperlipidemia. she also was noted to  have ulcerations develop in her legs and she has been applying Silvadene dressings locally. In the past she has refused wound care referrals.her cardiologist Dr. Saunders Revel saw her and put her on 40 mg of furosemide daily. last hemoglobin A1c was 7.7%. she was also placed on ciprofloxacin twice daily for 7 days and a urine culture was recommended. past medical history significant for coronary artery disease, diabetes mellitus, nonischemic cardiomyopathy and pulmonary hypertension. She is also status post heart catheterization and coronary angiography, tubal ligation and breast cyst removal in the past. She has never been a smoker. Patient had arterial studies done which showed bilateral ABIs are artificially elevated due to noncompressible and calcified vessels. Triphasic waveform throughout. Right great toe TBI is elevated while the left is normal. 10/23/2016 -- the patient is rather moribund from several issues including chronic back pain and knee pain and swelling of her legs. The large necrotic area on her left lateral anterior calf was bleeding on touch after washing her leg. There was a spot which needed silver nitrate cauterization and this was done appropriately. 11/06/2016 -- she is again noted to have friable bleeding from the left lower extremity wounds and this again had to be cauterized with silver nitrate to control the bleeding as pressure itself would not do it. 11/20/2016 -- the right lower extremity is completely healed and we have ordered 30-40 mm compression stocking's in both the dural layer and also a pair of juxta lites. 12/18/2016 -- she has not been wearing her compression stockings on her right leg and these have opened out into ulcerations again.  Her left lower extremity has circumferential ulcerations now. I believe at this stage I would like to get her venous reflux studies done to make certain that there is no fixable superficial venous reflux. 12/29/2016 -- she has made an excellent recovery having continued with her appropriate doses of diuretics and elevation and exercise. She has not yet received her right lower extremity juxta lites. Her venous duplex study is at the end of January 01/13/16 on evaluation today patient's wounds appeared to be overall doing much better much less hyper granular than previous week's evaluation. The Hydrofera Blue Dressing's to be doing very well. She does tell me that she is having a little bit more discomfort in the posterior aspect of her leg where she has a wound at this point. Fortunately there appears to be no infection. 01/19/17 on evaluation today patient appears to be doing very well in regard to her bilateral lower extremity swelling and at this point in time her left lower extremity ulcers. Her wounds appear to be doing much better. She has been tolerating the dressing's at this point fortunately she did get the Juxta-Lite compression as of today as well. Nonetheless I am pleased that she has been tolerating everything so well and that her wounds looks so good. In fact she is an excellent granular surface no evidence of slough covering and I do not see any reason for likely debridement today. Especially in regard to the posterior leg. 01/26/17 on evaluation today patient appears to be doing decently well in regard to her wounds. The surface of the majority of her wounds is greatly improved. She has been using the Juxta-Lite compression which is excellent. Nonetheless the wound of the left posterior calf did require some debridement today otherwise the majority of the wounds did not require any debridement. The this was due to slough buildup on the surface. She also has a small skin  flap where two  of the ulcers actually connected and this has loosened up I'm concerned that things may not heal well with that flap but for the time being I'm not gonna do anything different in that regard. 02/23/17 on evaluation today patient's wound actually appears to be doing fairly well in regard to the remaining left lateral ulcer which we have been treated. With that being said it does appear that her wrap actually slid down the wood bit over the past few days at least that there's actually some friction injury and blistering noted on the medial aspect of her malleolus of the left lower extremity. She continues to use the Juxta-Lite for the right lower extremity. With that being said no debridement is necessary today and these are very superficial injuries as far as the new injuries are concerned. 03/02/17 on evaluation today patient appears to be doing excellent in regard to her left lateral lower from the ulcers. In fact it appears that she is almost completely healed in regard to the ulcer we have been following and the new injuries which were caused by the wraps slipping down last week have been completely resolved which is excellent news. Overall I'm definitely pleased with the progress that she has made. Fortunately the rat did not cause AHONESTY, WOODFIN Watson. ([id number]) any irritation as it did previously with the Unna wrap. 03/09/17 on evaluation today patient's wound appears to be completely healed. Obviously this is great news. She and her husband both are extremely pleased and excited to finally have this gone. Obviously she has been dealing with this wound for quite some time. Readmission: 04/14/2019 upon evaluation today patient appears for reevaluation here in our clinic although it has been since March 2019 since I last saw her. She unfortunately is having issues similar to what she had previous which are blisters and ulcerations of the bilateral lower extremities. Fortunately there is no evidence of  active infection at this time based on what I am seeing. Unfortunately she is still having a lot of drainage and weeping. I do believe she needs to be compression wrap in order to get this under control and she is previously tolerated a 3 layer compression wrap without any complications whatsoever. No fevers, chills, nausea, vomiting, or diarrhea. Patient History Information obtained from Patient. Allergies penicillin, hydrochlorothiazide Family History Cancer - Mother,Siblings, Diabetes - Mother,Siblings, Heart Disease - Mother, Hypertension - Siblings, No family history of Hereditary Spherocytosis, Kidney Disease, Lung Disease, Seizures, Stroke, Thyroid Problems, Tuberculosis. Social History Never smoker, Marital Status - Married, Alcohol Use - Never, Drug Use - No History, Caffeine Use - Daily. Medical History Hematologic/Lymphatic Patient has history of Lymphedema Denies history of Anemia, Hemophilia, Human Immunodeficiency Virus, Sickle Cell Disease Respiratory Patient has history of Asthma Cardiovascular Patient has history of Congestive Heart Failure, Coronary Artery Disease, Hypertension Denies history of Angina, Arrhythmia, Deep Vein Thrombosis, Hypotension, Myocardial Infarction, Peripheral Arterial Disease, Peripheral Venous Disease, Phlebitis, Vasculitis Endocrine Patient has history of Type II Diabetes Denies history of Type I Diabetes Integumentary (Skin) Denies history of History of Burn, History of pressure wounds Musculoskeletal Patient has history of Osteoarthritis Denies history of Gout, Rheumatoid Arthritis, Osteomyelitis Patient is treated with Oral Agents. Blood sugar is not tested. Review of Systems (ROS) Constitutional Symptoms (General Health) Denies complaints or symptoms of Fatigue, Fever, Chills, Marked Weight Change. Eyes Denies complaints or symptoms of Dry Eyes, Vision Changes, Glasses / Contacts. Ear/Nose/Mouth/Throat Denies complaints or symptoms  of Difficult clearing ears, Sinusitis. Hematologic/Lymphatic  Denies complaints or symptoms of Bleeding / Clotting Disorders, Human Immunodeficiency Virus. Cardiovascular Denies complaints or symptoms of Chest pain. Gastrointestinal Denies complaints or symptoms of Frequent diarrhea, Nausea, Vomiting. Endocrine Denies complaints or symptoms of Hepatitis, Thyroid disease, Polydypsia (Excessive Thirst). Genitourinary Denies complaints or symptoms of Kidney failure/ Dialysis, Incontinence/dribbling. Immunological Denies complaints or symptoms of Hives, Itching. Integumentary (Skin) Complains or has symptoms of Wounds, Swelling. Denies complaints or symptoms of Bleeding or bruising tendency, Breakdown. Musculoskeletal ELYNA, PANGILINAN ([id number]) Denies complaints or symptoms of Muscle Pain, Muscle Weakness. Neurologic Denies complaints or symptoms of Numbness/parasthesias, Focal/Weakness. Psychiatric Denies complaints or symptoms of Anxiety, Claustrophobia. Objective Constitutional patient is hypertensive.. pulse regular and within target range for patient.Marland Kitchen respirations regular, non-labored and within target range for patient.Marland Kitchen temperature within target range for patient.. Well-nourished and well-hydrated in no acute distress. Vitals Time Taken: 1:07 PM, Height: 64 in, Source: Measured, Weight: 206 lbs, Source: Measured, BMI: 35.4, Temperature: 98.4 F, Pulse: 61 bpm, Respiratory Rate: 16 breaths/min, Blood Pressure: 133/61 mmHg. Eyes conjunctiva clear no eyelid edema noted. pupils equal round and reactive to light and accommodation. Ears, Nose, Mouth, and Throat no gross abnormality of ear auricles or external auditory canals. normal hearing noted during conversation. mucus membranes moist. Respiratory normal breathing without difficulty. Psychiatric Patient is not able to cooperate in decision making regarding care Completely.. Patient is oriented to person and place But does  appear to have some degree of dementia.. pleasant and cooperative. General Notes: Upon inspection today patient had fairly superficial ulcerations of the bilateral lower extremities in the calf region which do not appear to be showing any signs of active infection at this time which is great news. Fortunately there are no signs of active infection at this time which is also great news. No fevers, chills, nausea, vomiting, or diarrhea. Integumentary (Hair, Skin) Wound #14 status is Open. Original cause of wound was Blister. The wound is located on the Right,Anterior Lower Leg. The wound measures 2.5cm length x 9.3cm width x 0.1cm depth; 18.261cm^2 area and 1.826cm^3 volume. There is Fat Layer (Subcutaneous Tissue) Exposed exposed. There is no tunneling or undermining noted. There is a large amount of serous drainage noted. The wound margin is flat and intact. There is large (67-100%) pink granulation within the wound bed. There is a small (1-33%) amount of necrotic tissue within the wound bed including Adherent Slough. Wound #15 status is Open. Original cause of wound was Blister. The wound is located on the Right,Lateral Lower Leg. The wound measures 1.5cm length x 2cm width x 0.1cm depth; 2.356cm^2 area and 0.236cm^3 volume. There is Fat Layer (Subcutaneous Tissue) Exposed exposed. There is no tunneling or undermining noted. There is a large amount of serous drainage noted. The wound margin is flat and intact. There is large (67-100%) pink granulation within the wound bed. There is a small (1-33%) amount of necrotic tissue within the wound bed including Adherent Slough. Wound #16 status is Open. Original cause of wound was Blister. The wound is located on the Left,Medial Lower Leg. The wound measures 3.2cm length x 6.4cm width x 0.1cm depth; 16.085cm^2 area and 1.608cm^3 volume. There is Fat Layer (Subcutaneous Tissue) Exposed exposed. There is no tunneling or undermining noted. There is a large  amount of serous drainage noted. The wound margin is flat and intact. There is large (67-100%) pink granulation within the wound bed. There is a small (1-33%) amount of necrotic tissue within the wound bed including Adherent Slough. Wound #17 status is  Open. Original cause of wound was Blister. The wound is located on the Left,Anterior Lower Leg. The wound measures 4cm length x 13cm width x 0.1cm depth; 40.841cm^2 area and 4.084cm^3 volume. There is Fat Layer (Subcutaneous Tissue) Exposed exposed. There is no tunneling or undermining noted. There is a large amount of serous drainage noted. The wound margin is flat and intact. There is large (67-100%) pink granulation within the wound bed. There is a small (1-33%) amount of necrotic tissue within the wound bed including Adherent Slough. Wound #18 status is Open. Original cause of wound was Blister. The wound is located on the Left,Lateral Lower Leg. The wound measures 43.2cm length x 1.8cm width x 0.1cm depth; 61.073cm^2 area and 6.107cm^3 volume. There is Fat Layer (Subcutaneous Tissue) Exposed exposed. There is no tunneling or undermining noted. There is a large amount of serous drainage noted. The wound margin is flat and intact. There is large (67-100%) pink granulation within the wound bed. There is a small (1-33%) amount of necrotic tissue within the wound bed including Adherent Slough. 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 There was a Three Layer Compression Therapy Procedure by Army Melia, RN. Post procedure Diagnosis Wound #14: Same as Pre-Procedure Wound #15 Pre-procedure diagnosis  of Wound #15 is a Venous Leg Ulcer located on the Right,Lateral Lower Leg . There was a Three Layer Compression Therapy Procedure by Army Melia, RN. Post procedure Diagnosis Wound #15: Same as Pre-Procedure Wound #16 Pre-procedure diagnosis of Wound #16 is a Venous Leg Ulcer located on the Left,Medial Lower Leg . There was a Three Layer Compression Therapy Procedure by Army Melia, RN. Post procedure Diagnosis Wound #16: Same as Pre-Procedure Wound #17 Pre-procedure diagnosis of Wound #17 is a Venous Leg Ulcer located on the Left,Anterior Lower Leg . There was a Three Layer Compression Therapy Procedure by Army Melia, RN. Post procedure Diagnosis Wound #17: Same as Pre-Procedure Wound #18 Pre-procedure diagnosis of Wound #18 is a Venous Leg Ulcer located on the Left,Lateral Lower Leg . There was a Three Layer Compression Therapy Procedure by Army Melia, RN. Post procedure Diagnosis Wound #18: Same as Pre-Procedure Plan Wound Cleansing: Wound #14 Right,Anterior Lower Leg: Dial antibacterial soap, wash wounds, rinse and pat dry prior to dressing wounds - HH nurse and in office May shower with protection. - Do not get wraps wet Wound #15 Right,Lateral Lower Leg: Dial antibacterial soap, wash wounds, rinse and pat dry prior to dressing wounds - HH nurse and in office May shower with protection. - Do not get wraps wet Wound #16 Left,Medial Lower Leg: Dial antibacterial soap, wash wounds, rinse and pat dry prior to dressing wounds - HH nurse and in office May shower with protection. - Do not get wraps wet Wound #17 Left,Anterior Lower Leg: Dial antibacterial soap, wash wounds, rinse and pat dry prior to dressing wounds - HH nurse and in office May shower with protection. - Do not get wraps wet Wound #18 Left,Lateral Lower Leg: Dial antibacterial soap, wash wounds, rinse and pat dry prior to dressing wounds - HH nurse and in office May shower with protection. - Do not get wraps  wet GISELLE, BRUTUS Watson. ([id number]) Primary Wound Dressing:  Wound #14 Right,Anterior Lower Leg: Silver Alginate Wound #15 Right,Lateral Lower Leg: Silver Alginate Wound #16 Left,Medial Lower Leg: Silver Alginate Wound #17 Left,Anterior Lower Leg: Silver Alginate Wound #18 Left,Lateral Lower Leg: Silver Alginate Secondary Dressing: Wound #14 Right,Anterior Lower Leg: XtraSorb Wound #15 Right,Lateral Lower Leg: XtraSorb Wound #16 Left,Medial Lower Leg: XtraSorb Wound #17 Left,Anterior Lower Leg: XtraSorb Wound #18 Left,Lateral Lower Leg: XtraSorb Dressing Change Frequency: Wound #14 Right,Anterior Lower Leg: Change Dressing Monday, Wednesday, Friday - To be changed by [facility] until next visit Wound #15 Right,Lateral Lower Leg: Change Dressing Monday, Wednesday, Friday - To be changed by [facility] until next visit Wound #16 Left,Medial Lower Leg: Change Dressing Monday, Wednesday, Friday - To be changed by [facility] until next visit Wound #17 Left,Anterior Lower Leg: Change Dressing Monday, Wednesday, Friday - To be changed by [facility] until next visit Wound #18 Left,Lateral Lower Leg: Change Dressing Monday, Wednesday, Friday - To be changed by [facility] until next visit Follow-up Appointments: Wound #14 Right,Anterior Lower Leg: Return Appointment in 2 weeks. Wound #15 Right,Lateral Lower Leg: Return Appointment in 2 weeks. Wound #16 Left,Medial Lower Leg: Return Appointment in 2 weeks. Wound #17 Left,Anterior Lower Leg: Return Appointment in 2 weeks. Wound #18 Left,Lateral Lower Leg: Return Appointment in 2 weeks. Edema Control: Wound #14 Right,Anterior Lower Leg: 3 Layer Compression System - Bilateral Wound #15 Right,Lateral Lower Leg: 3 Layer Compression System - Bilateral Wound #16 Left,Medial Lower Leg: 3 Layer Compression System - Bilateral Wound #17 Left,Anterior Lower Leg: 3 Layer Compression System - Bilateral Wound #18 Left,Lateral Lower Leg: 3 Layer Compression System - Bilateral Home  Health: Wound #14 Right,Anterior Lower Leg: [place] for [place] Nurse may visit PRN to address patient s wound care needs. FACE TO FACE ENCOUNTER: MEDICARE and MEDICAID PATIENTS: I certify that this patient is under my care and that I had a face-to-face encounter that meets the physician face-to-face encounter requirements with this patient on this date. The encounter with the patient was in whole or in part for the following MEDICAL CONDITION: (primary reason for [place]) MEDICAL NECESSITY: I certify, that based on my findings, NURSING services are a medically necessary home health service. HOME BOUND STATUS: I certify that my clinical findings support that this patient is homebound (i.e., Due to illness or injury, pt requires aid of supportive devices such as crutches, cane, wheelchairs, walkers, the use of special transportation or the assistance of another person to leave their place of residence. There is a normal inability to leave the home and doing so requires considerable and taxing effort. Other absences are for medical reasons / religious services and are infrequent or of short duration when for other reasons). If current dressing causes regression in wound condition, may D/C ordered dressing product/s and apply Normal Saline Moist Dressing daily until next [place] / Other MD appointment. [place] of regression in wound condition at [phone]. Please direct any NON-WOUND related issues/requests for orders to patient's Primary Care Physician Wound #15 Right,Lateral Lower Leg: [facility] for [place], Sharmon Watson. ([id number]) Home Health Nurse may visit PRN to address patient s wound care needs. FACE TO FACE ENCOUNTER: MEDICARE and MEDICAID PATIENTS: I certify that this patient is under my care and that I had a face-to-face encounter that meets the physician face-to-face encounter requirements  with this patient on this date. The encounter with the patient was in whole or in part for the following MEDICAL CONDITION: (primary  reason for Home Healthcare) MEDICAL NECESSITY: I certify, that based on my findings, NURSING services are a medically necessary home health service. HOME BOUND STATUS: I certify that my clinical findings support that this patient is homebound (i.e., Due to illness or injury, pt requires aid of supportive devices such as crutches, cane, wheelchairs, walkers, the use of special transportation or the assistance of another person to leave their place of residence. There is a normal inability to leave the home and doing so requires considerable and taxing effort. Other absences are for medical reasons / religious services and are infrequent or of short duration when for other reasons). If current dressing causes regression in wound condition, may D/C ordered dressing product/s and apply Normal Saline Moist Dressing daily until next [place] / Other MD appointment. [place] of regression in wound condition at [phone]. Please direct any NON-WOUND related issues/requests for orders to patient's Primary Care Physician Wound #16 Left,Medial Lower Leg: [place] for [place] Nurse may visit PRN to address patient s wound care needs. FACE TO FACE ENCOUNTER: MEDICARE and MEDICAID PATIENTS: I certify that this patient is under my care and that I had a face-to-face encounter that meets the physician face-to-face encounter requirements with this patient on this date. The encounter with the patient was in whole or in part for the following MEDICAL CONDITION: (primary reason for [place]) MEDICAL NECESSITY: I certify, that based on my findings, NURSING services are a medically necessary home health service. HOME BOUND STATUS: I certify that my clinical findings support that this patient is homebound (i.e., Due to  illness or injury, pt requires aid of supportive devices such as crutches, cane, wheelchairs, walkers, the use of special transportation or the assistance of another person to leave their place of residence. There is a normal inability to leave the home and doing so requires considerable and taxing effort. Other absences are for medical reasons / religious services and are infrequent or of short duration when for other reasons). If current dressing causes regression in wound condition, may D/C ordered dressing product/s and apply Normal Saline Moist Dressing daily until next [place] / Other MD appointment. [place] of regression in wound condition at [phone]. Please direct any NON-WOUND related issues/requests for orders to patient's Primary Care Physician Wound #17 Left,Anterior Lower Leg: [place] for [place] Nurse may visit PRN to address patient s wound care needs. FACE TO FACE ENCOUNTER: MEDICARE and MEDICAID PATIENTS: I certify that this patient is under my care and that I had a face-to-face encounter that meets the physician face-to-face encounter requirements with this patient on this date. The encounter with the patient was in whole or in part for the following MEDICAL CONDITION: (primary reason for [place]) MEDICAL NECESSITY: I certify, that based on my findings, NURSING services are a medically necessary home health service. HOME BOUND STATUS: I certify that my clinical findings support that this patient is homebound (i.e., Due to illness or injury, pt requires aid of supportive devices such as crutches, cane, wheelchairs, walkers, the use of special transportation or the assistance of another person to leave their place of residence. There is a normal inability to leave the home and doing so requires considerable and taxing effort. Other absences are for medical reasons / religious services and are infrequent or  of short duration when for other reasons). If current dressing causes regression in wound condition, may  D/C ordered dressing product/s and apply Normal Saline Moist Dressing daily until next [place] / Other MD appointment. [place] of regression in wound condition at [phone]. Please direct any NON-WOUND related issues/requests for orders to patient's Primary Care Physician Wound #18 Left,Lateral Lower Leg: [place] for [place] Nurse may visit PRN to address patient s wound care needs. FACE TO FACE ENCOUNTER: MEDICARE and MEDICAID PATIENTS: I certify that this patient is under my care and that I had a face-to-face encounter that meets the physician face-to-face encounter requirements with this patient on this date. The encounter with the patient was in whole or in part for the following MEDICAL CONDITION: (primary reason for [place]) MEDICAL NECESSITY: I certify, that based on my findings, NURSING services are a medically necessary home health service. HOME BOUND STATUS: I certify that my clinical findings support that this patient is homebound (i.e., Due to illness or injury, pt requires aid of supportive devices such as crutches, cane, wheelchairs, walkers, the use of special transportation or the assistance of another person to leave their place of residence. There is a normal inability to leave the home and doing so requires considerable and taxing effort. Other absences are for medical reasons / religious services and are infrequent or of short duration when for other reasons). If current dressing causes regression in wound condition, may D/C ordered dressing product/s and apply Normal Saline Moist Dressing daily until next [place] / Other MD appointment. [place] of regression in wound condition at [phone]. Please direct any NON-WOUND related issues/requests for orders to  patient's Primary Care Physician 1. My suggestion at this time is can be that we go ahead and initiate treatment with the cleansing of her legs with Dial antimicrobial soap. 2. I am also can recommend that we go ahead and use silver alginate dressing to the open wound areas. 3. We will use Xtrasorb over top of this. 4. We will go ahead and initiate a 3 layer compression wrap for the lower extremities as well. 5. I am also going to see about going ahead initiating home health for the patient as well. We will see patient back for reevaluation in 2 weeks here in the clinic. If anything worsens or changes patient will contact our office for additional recommendations. Electronic Signature(s) Signed: 04/14/2019 5:50:58 PM By: Curtis Sites, SACORA HAWBAKER ([id number]) Entered By: Worthy Keeler on 04/14/2019 17:50:57 [name] ([id number]) -------------------------------------------------------------------------------- ROS/PFSH Details Patient Name: JAYLINE, KILBURG. Date of Service: 04/14/2019 1:00 PM Medical Record Number: [id number] Patient Account Number: [id number] Date of Birth/Sex: 1945-03-03 (74 y.o. F) Treating RN: Montey Hora Primary Care Provider: Lelon Huh Other Clinician: Referring Provider: Lelon Huh Treating Provider/Extender: Melburn Hake, Sarinity Dicicco Weeks in Treatment: 0 Information Obtained From Patient Constitutional Symptoms (General Health) Complaints and Symptoms: Negative for: Fatigue; Fever; Chills; Marked Weight Change Eyes Complaints and Symptoms: Negative for: Dry Eyes; Vision Changes; Glasses / Contacts Ear/Nose/Mouth/Throat Complaints and Symptoms: Negative for: Difficult clearing ears; Sinusitis Hematologic/Lymphatic Complaints and Symptoms: Negative for: Bleeding / Clotting Disorders; Human Immunodeficiency Virus Medical History: Positive for: Lymphedema Negative for: Anemia; Hemophilia; Human Immunodeficiency Virus; Sickle Cell  Disease Cardiovascular Complaints and Symptoms: Negative for: Chest pain Medical History: Positive for: Congestive Heart Failure; Coronary Artery Disease; Hypertension Negative for: Angina; Arrhythmia; Deep Vein Thrombosis; Hypotension; Myocardial Infarction; Peripheral Arterial Disease; Peripheral Venous Disease; Phlebitis; Vasculitis Gastrointestinal Complaints and Symptoms: Negative for: Frequent diarrhea; Nausea;  Vomiting Endocrine Complaints and Symptoms: Negative for: Hepatitis; Thyroid disease; Polydypsia (Excessive Thirst) Medical History: Positive for: Type II Diabetes Negative for: Type I Diabetes Time with diabetes: a long time Treated with: Oral agents Blood sugar tested every day: No Genitourinary Complaints and Symptoms: Negative for: Kidney failure/ Dialysis; Incontinence/dribbling [name], [name] ([id number]) Immunological Complaints and Symptoms: Negative for: Hives; Itching Integumentary (Skin) Complaints and Symptoms: Positive for: Wounds; Swelling Negative for: Bleeding or bruising tendency; Breakdown Medical History: Negative for: History of Burn; History of pressure wounds Musculoskeletal Complaints and Symptoms: Negative for: Muscle Pain; Muscle Weakness Medical History: Positive for: Osteoarthritis Negative for: Gout; Rheumatoid Arthritis; Osteomyelitis Neurologic Complaints and Symptoms: Negative for: Numbness/parasthesias; Focal/Weakness Psychiatric Complaints and Symptoms: Negative for: Anxiety; Claustrophobia Respiratory Medical History: Positive for: Asthma Oncologic Immunizations Pneumococcal Vaccine: Received Pneumococcal Vaccination: Yes Implantable Devices None Family and Social History Cancer: Yes - Mother,Siblings; Diabetes: Yes - Mother,Siblings; Heart Disease: Yes - Mother; Hereditary Spherocytosis: No; Hypertension: Yes - Siblings; Kidney Disease: No; Lung Disease: No; Seizures: No; Stroke: No; Thyroid Problems: No;  Tuberculosis: No; Never smoker; Marital Status - Married; Alcohol Use: Never; Drug Use: No History; Caffeine Use: Daily; Financial Concerns: No; Food, Clothing or Shelter Needs: No; Support System Lacking: No; Transportation Concerns: No Electronic Signature(s) Signed: 04/14/2019 4:36:33 PM By: Montey Hora Signed: 04/15/2019 6:10:11 PM By: Worthy Keeler PA-C Entered By: Montey Hora on 04/14/2019 13:10:17 [name] ([id number]) -------------------------------------------------------------------------------- [place] Details Patient Name: [name]. Date of Service: 04/14/2019 Medical Record Number: [id number] Patient Account Number: [id number] Date of Birth/Sex: [birth year] (74 y.o. 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 Physician Procedures CPT4 Code: 7616073 Description: 71062 - WC PHYS LEVEL 3 - EST PT Modifier: Quantity: 1 CPT4 Code: Description: ICD-10 Diagnosis Description E11.622 Type 2 diabetes mellitus with other skin ulcer I89.0 Lymphedema, not elsewhere classified L97.212 Non-pressure chronic ulcer of right calf with fat layer exposed L97.222 Non-pressure chronic ulcer of left  calf with fat layer exposed Modifier: Quantity: Electronic Signature(s) Signed: 04/14/2019 6:11:08 PM By: Worthy Keeler PA-C Previous Signature: 04/14/2019 4:09:07 PM Version By: Army Melia Entered By: Worthy Keeler on 04/14/2019 18:11:07

## 2019-04-14 NOTE — Progress Notes (Addendum)
[name], [name] ([id number]) Visit Report for 04/14/2019 Allergy List Details Patient Name: [name], [name]. Date of Service: 04/14/2019 1:00 PM Medical Record Number: [id number] Patient Account Number: [id number] Date of Birth/Sex: 05-17-1945 (74 y.o. F) Treating RN: Montey Hora Primary Care Mlissa Tamayo: Lelon Huh Other Clinician: Referring Shamila Lerch: Lelon Huh Treating Anikah Hogge/Extender: STONE III, HOYT Weeks in Treatment: 0 Allergies Active Allergies penicillin hydrochlorothiazide Allergy Notes Electronic Signature(s) Signed: 04/14/2019 4:36:33 PM By: Montey Hora Entered By: Montey Hora on 04/14/2019 13:07:54 [name] ([id number]) -------------------------------------------------------------------------------- Arrival Information Details Patient Name: [name] Date of Service: 04/14/2019 1:00 PM Medical Record Number: [id number] Patient Account Number: [id number] Date of Birth/Sex: September 23, 1945 (74 y.o. F) Treating RN: Montey Hora Primary Care Ohanna Gassert: Lelon Huh Other Clinician: Referring Kahlia Lagunes: Lelon Huh Treating Josey Forcier/Extender: Melburn Hake, HOYT Weeks in Treatment: 0 Visit Information Patient Arrived: Wheel Chair Arrival Time: 13:03 Accompanied By: husband Transfer Assistance: Manual Patient Identification Verified: Yes Secondary Verification Process Completed: Yes History Since Last Visit Added or deleted any medications: No Any new allergies or adverse reactions: No Had a fall or experienced change in activities of daily living that may affect risk of falls: No Signs or symptoms of abuse/neglect since last visito No Hospitalized since last visit: No Implantable device outside of the clinic excluding cellular tissue based products placed in the center since last visit: No Electronic Signature(s) Signed: 04/14/2019 4:36:33 PM By: Montey Hora Entered By: Montey Hora on 04/14/2019 13:07:03 [name]  ([id number]) -------------------------------------------------------------------------------- Clinic Level of Care Assessment Details Patient Name: [name] Date of Service: 04/14/2019 1:00 PM Medical Record Number: [id number] Patient Account Number: [id number] Date of Birth/Sex: November 24, 1945 (74 y.o. F) Treating RN: Army Melia Primary Care Newt Levingston: Lelon Huh Other Clinician: Referring Zandra Lajeunesse: Lelon Huh Treating Yalena Colon/Extender: Melburn Hake, HOYT Weeks in Treatment: 0 Clinic Level of Care Assessment Items TOOL 1 Quantity Score []  - Use when EandM and Procedure is performed on INITIAL visit 0 ASSESSMENTS - Nursing Assessment / Reassessment X - General Physical Exam (combine w/ comprehensive assessment (listed just below) when performed on new pt. 1 20 evals) X- 1 25 Comprehensive Assessment (HX, ROS, Risk Assessments, Wounds Hx, etc.) ASSESSMENTS - Wound and Skin Assessment / Reassessment []  - Dermatologic / Skin Assessment (not related to wound area) 0 ASSESSMENTS - Ostomy and/or Continence Assessment and Care []  - Incontinence Assessment and Management 0 []  - 0 Ostomy Care Assessment and Management (repouching, etc.) PROCESS - Coordination of Care X - Simple Patient / Family Education for ongoing care 1 15 []  - 0 Complex (extensive) Patient / Family Education for ongoing care X- 1 10 Staff obtains Programmer, systems, Records, Test Results / Process Orders []  - 0 Staff telephones HHA, Nursing Homes / Clarify orders / etc []  - 0 Routine Transfer to another Facility (non-emergent condition) []  - 0 Routine Hospital Admission (non-emergent condition) X- 1 15 New Admissions / Biomedical engineer / Ordering NPWT, Apligraf, etc. []  - 0 Emergency Hospital Admission (emergent condition) PROCESS - Special Needs []  - Pediatric / Minor Patient Management 0 []  - 0 Isolation Patient Management []  - 0 Hearing / Language / Visual special needs []  - 0 Assessment of  Community assistance (transportation, D/C planning, etc.) []  - 0 Additional assistance / Altered mentation []  - 0 Support Surface(s) Assessment (bed, cushion, seat, etc.) INTERVENTIONS - Miscellaneous []  - External ear exam 0 []  - 0 Patient Transfer (multiple staff / Civil Service fast streamer / Similar devices) []  - 0 Simple Staple / Suture removal (25  or less) []  - 0 Complex Staple / Suture removal (26 or more) []  - 0 Hypo/Hyperglycemic Management (do not check if billed separately) BRADIE, SANGIOVANNI ([id number]) []  - 0 Ankle / Brachial Index (ABI) - do not check if billed separately Has the patient been seen at the hospital within the last three years: Yes Total Score: 85 Level Of Care: New/Established - Level 3 Electronic Signature(s) Signed: 04/14/2019 4:09:07 PM By: Army Melia Entered By: Army Melia on 04/14/2019 13:54:37 Kaigler, Jeanice Lim ([id number]) -------------------------------------------------------------------------------- Compression Therapy Details Patient Name: Jillian Albe B. Date of Service: 04/14/2019 1:00 PM Medical Record Number: [id number] Patient Account Number: [id number] Date of Birth/Sex: [birth year] (74 y.o. F) Treating RN: Army Melia Primary Care Khadir Roam: Lelon Huh Other Clinician: Referring Chastin Garlitz: Lelon Huh Treating Gedeon Brandow/Extender: Melburn Hake, HOYT Weeks in Treatment: 0 Compression Therapy Performed for Wound Assessment: Wound #14 Right,Anterior Lower Leg Performed By: Clinician Army Melia, RN Compression Type: Three Layer Post Procedure Diagnosis Same as Pre-procedure Electronic Signature(s) Signed: 04/14/2019 4:09:07 PM By: Army Melia Entered By: Army Melia on 04/14/2019 13:51:23 [name] ([id number]) -------------------------------------------------------------------------------- Compression Therapy Details Patient Name: Jillian, [name] Date of Service: 04/14/2019 1:00 PM Medical Record Number: [id number] Patient Account Number:  [id number] Date of Birth/Sex: 1945/04/19 (74 y.o. F) Treating RN: Army Melia Primary Care Charl Wellen: Lelon Huh Other Clinician: Referring Qusay Villada: Lelon Huh Treating Brecken Dewoody/Extender: Melburn Hake, HOYT Weeks in Treatment: 0 Compression Therapy Performed for Wound Assessment: Wound #15 Right,Lateral Lower Leg Performed By: Clinician Army Melia, RN Compression Type: Three Layer Post Procedure Diagnosis Same as Pre-procedure Electronic Signature(s) Signed: 04/14/2019 4:09:07 PM By: Army Melia Entered By: Army Melia on 04/14/2019 13:51:23 [name] ([id number]) -------------------------------------------------------------------------------- Compression Therapy Details Patient Name: AUDRIELLE, VANKUREN B. Date of Service: 04/14/2019 1:00 PM Medical Record Number: [id number] Patient Account Number: [id number] Date of Birth/Sex: [birth year] (74 y.o. F) Treating RN: Army Melia Primary Care Cynara Tatham: Lelon Huh Other Clinician: Referring Jezelle Gullick: Lelon Huh Treating Araminta Zorn/Extender: Melburn Hake, HOYT Weeks in Treatment: 0 Compression Therapy Performed for Wound Assessment: Wound #16 Left,Medial Lower Leg Performed By: Clinician Army Melia, RN Compression Type: Three Layer Post Procedure Diagnosis Same as Pre-procedure Electronic Signature(s) Signed: 04/14/2019 4:09:07 PM By: Army Melia Entered By: Army Melia on 04/14/2019 13:51:23 [name] ([id number]) -------------------------------------------------------------------------------- Compression Therapy Details Patient Name: VELENA, KEEGAN B. Date of Service: 04/14/2019 1:00 PM Medical Record Number: [id number] Patient Account Number: [id number] Date of Birth/Sex: 03-28-1945 (74 y.o. F) Treating RN: Army Melia Primary Care Dhaval Woo: Lelon Huh Other Clinician: Referring Jailah Willis: Lelon Huh Treating Jeri Jeanbaptiste/Extender: Melburn Hake, HOYT Weeks in Treatment: 0 Compression Therapy Performed for Wound  Assessment: Wound #17 Left,Anterior Lower Leg Performed By: Clinician Army Melia, RN Compression Type: Three Layer Post Procedure Diagnosis Same as Pre-procedure Electronic Signature(s) Signed: 04/14/2019 4:09:07 PM By: Army Melia Entered By: Army Melia on 04/14/2019 13:51:24 [name] ([id number]) -------------------------------------------------------------------------------- Compression Therapy Details Patient Name: NILDA, KEATHLEY B. Date of Service: 04/14/2019 1:00 PM Medical Record Number: [id number] Patient Account Number: [id number] Date of Birth/Sex: [birth year] (74 y.o. F) Treating RN: Army Melia Primary Care Dynisha Due: Lelon Huh Other Clinician: Referring Daveigh Batty: Lelon Huh Treating Rendon Howell/Extender: Melburn Hake, HOYT Weeks in Treatment: 0 Compression Therapy Performed for Wound Assessment: Wound #18 Left,Lateral Lower Leg Performed By: Clinician Army Melia, RN Compression Type: Three Layer Post Procedure Diagnosis Same as Pre-procedure Electronic Signature(s) Signed: 04/14/2019 4:09:07 PM By: Army Melia Entered By: Army Melia on 04/14/2019 13:51:24 [name] ([id number]) -------------------------------------------------------------------------------- Encounter Discharge Information Details Patient  Name: Reade, [place] Date of Service: 04/14/2019 1:00 PM Medical Record Number: [id number] Patient Account Number: [id number] Date of Birth/Sex: September 10, 1945 (74 y.o. F) Treating RN: Army Melia Primary Care Markus Casten: Lelon Huh Other Clinician: Referring Shaely Gadberry: Lelon Huh Treating Ajmal Kathan/Extender: Melburn Hake, HOYT Weeks in Treatment: 0 Encounter Discharge Information Items Discharge Condition: Stable Ambulatory Status: Wheelchair Discharge Destination: Home Transportation: Private Auto Accompanied By: husband Schedule Follow-up Appointment: Yes Clinical Summary of Care: Electronic Signature(s) Signed: 04/14/2019 4:09:07 PM By:  Army Melia Entered By: Army Melia on 04/14/2019 13:56:10 [name] ([id number]) -------------------------------------------------------------------------------- Lower Extremity Assessment Details Patient Name: DAMIAH, MCDONALD B. Date of Service: 04/14/2019 1:00 PM Medical Record Number: [id number] Patient Account Number: [id number] Date of Birth/Sex: [birth year] (74 y.o. F) Treating RN: Montey Hora Primary Care Rexanna Louthan: Lelon Huh Other Clinician: Referring Elysia Grand: Lelon Huh Treating Michol Emory/Extender: Melburn Hake, HOYT Weeks in Treatment: 0 Edema Assessment Assessed: [Left: No] [Right: No] Edema: [Left: Yes] [Right: Yes] Calf Left: Right: Point of Measurement: 30 cm From Medial Instep 50.4 cm 49.6 cm Ankle Left: Right: Point of Measurement: 10 cm From Medial Instep 24.5 cm 22.3 cm Vascular Assessment Pulses: Dorsalis Pedis Palpable: [Left:Yes] [Right:Yes] Doppler Audible: [Left:Yes] [Right:Yes] Posterior Tibial Palpable: [Left:Yes Yes] [Right:Yes Yes] Notes ABI [place] Bilateral >220 Electronic Signature(s) Signed: 04/14/2019 4:36:33 PM By: Montey Hora Entered By: Montey Hora on 04/14/2019 13:39:17 [name] ([id number]) -------------------------------------------------------------------------------- Multi Wound Chart Details Patient Name: Jillian Albe B. Date of Service: 04/14/2019 1:00 PM Medical Record Number: [id number] Patient Account Number: [id number] Date of Birth/Sex: 01-18-1945 (74 y.o. 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 Extremity Comorbid History: Lymphedema, Asthma, Congestive Lymphedema, Asthma, Congestive Lymphedema, Asthma, Congestive Heart Failure, Coronary Artery Heart Failure, Coronary Artery Heart Failure, Coronary Artery Disease, Hypertension, Type II Disease, Hypertension, Type II Disease, Hypertension, Type II Diabetes, Osteoarthritis Diabetes, Osteoarthritis Diabetes, Osteoarthritis Date Acquired: 03/31/2019 03/31/2019 03/31/2019 Weeks of Treatment: 0 0 0 Wound Status: Open Open Open Clustered Wound: Yes No No Clustered Quantity: 2 N/A N/A Measurements L x W x D (cm) 2.5x9.3x0.1 1.5x2x0.1 3.2x6.4x0.1 Area (cm) : 18.261 2.356 16.085 Volume (cm) : 1.826 0.236 1.608 Classification: Full Thickness Without Exposed Full Thickness Without Exposed Full Thickness Without Exposed Support Structures Support Structures Support Structures Exudate Amount: Large Large Large Exudate Type: Serous Serous Serous Exudate Color: amber amber amber Wound Margin: Flat and Intact Flat and Intact Flat and Intact Granulation Amount: Large (67-100%) Large (67-100%) Large (67-100%) Granulation Quality: Pink Pink Pink Necrotic Amount: Small (1-33%) Small (1-33%) Small (1-33%) Exposed Structures: Fat Layer (Subcutaneous Tissue) Fat Layer (Subcutaneous Tissue) Fat Layer (Subcutaneous Tissue) Exposed: Yes Exposed: Yes Exposed: Yes Fascia: No Fascia: No Fascia: No Tendon: No Tendon: No Tendon: No Muscle: No Muscle: No Muscle: No Joint: No Joint: No Joint: No Bone: No Bone: No Bone: No Epithelialization: Small (1-33%) None None Wound Number: [id number] 18 N/A Photos: N/A MINOLA, GUIN ([id number]) Wound Location: Left, Anterior Lower Leg Left, Lateral Lower Leg N/A Wounding Event: Blister Blister  N/A Primary Etiology:  Venous Leg Ulcer Venous Leg Ulcer N/A Secondary Etiology: Diabetic Wound/Ulcer of the Lower Diabetic Wound/Ulcer of the Lower N/A Extremity Extremity Comorbid History: Lymphedema, Asthma, Congestive Lymphedema, Asthma, Congestive N/A Heart Failure, Coronary Artery Heart Failure, Coronary Artery Disease, Hypertension, Type II Disease, Hypertension, Type II Diabetes, Osteoarthritis Diabetes, Osteoarthritis Date Acquired: 03/31/2019 03/31/2019 N/A Weeks of Treatment: 0 0 N/A Wound Status: Open Open N/A Clustered Wound: Yes Yes N/A Clustered Quantity: 3 2 N/A Measurements L x W x D (cm) 4x13x0.1 43.2x1.8x0.1 N/A Area (cm) : 40.841 61.073 N/A Volume (cm) : 4.084 6.107 N/A Classification: Full Thickness Without Exposed Full Thickness Without Exposed N/A Support Structures Support Structures Exudate Amount: Large Large N/A Exudate Type: Serous Serous N/A Exudate Color: amber amber N/A Wound Margin: Flat and Intact Flat and Intact N/A Granulation Amount: Large (67-100%) Large (67-100%) N/A Granulation Quality: Pink Pink N/A Necrotic Amount: Small (1-33%) Small (1-33%) N/A Exposed Structures: Fat Layer (Subcutaneous Tissue) Fat Layer (Subcutaneous Tissue) N/A Exposed: Yes Exposed: Yes Fascia: No Fascia: No Tendon: No Tendon: No Muscle: No Muscle: No Joint: No Joint: No Bone: No Bone: No Epithelialization: None None N/A Treatment Notes Electronic Signature(s) Signed: 04/14/2019 4:09:07 PM By: Army Melia Entered By: Army Melia on 04/14/2019 13:47:43 [name] ([id number]) -------------------------------------------------------------------------------- [place] Details Patient Name: ARROW, EMMERICH B. Date of Service: 04/14/2019 1:00 PM Medical Record Number: [id number] Patient Account Number: [id number] Date of Birth/Sex: [birth year] (74 y.o. F) Treating RN: Army Melia Primary Care Kamrin Sibley: Lelon Huh Other  Clinician: Referring Howard Patton: Lelon Huh Treating Berry Godsey/Extender: Melburn Hake, HOYT Weeks in Treatment: 0 Active Inactive Orientation to the Wound Care Program Nursing Diagnoses: Knowledge deficit related to the wound healing center program Goals: Patient/caregiver will verbalize understanding of the [place] Program Date Initiated: 04/14/2019 Target Resolution Date: 05/09/2019 Goal Status: Active Interventions: Provide education on orientation to the wound center Notes: Venous Leg Ulcer Nursing Diagnoses: Potential for venous Insuffiency (use before diagnosis confirmed) Goals: Patient/caregiver will verbalize understanding of disease process and disease management Date Initiated: 04/14/2019 Target Resolution Date: 05/09/2019 Goal Status: Active Interventions: Assess peripheral edema status every visit. Notes: Wound/Skin Impairment Nursing Diagnoses: Impaired tissue integrity Goals: Ulcer/skin breakdown will have a volume reduction of 30% by week 4 Date Initiated: 04/14/2019 Target Resolution Date: 05/09/2019 Goal Status: Active Interventions: Assess ulceration(s) every visit Notes: Electronic Signature(s) Signed: 04/14/2019 4:09:07 PM By: Army Melia Entered By: Army Melia on 04/14/2019 13:47:24 [name] ([id number]) CORAL, TIMME ([id number]) -------------------------------------------------------------------------------- Pain Assessment Details Patient Name: TALAYAH, PICARDI B. Date of Service: 04/14/2019 1:00 PM Medical Record Number: [id number] Patient Account Number: [id number] Date of Birth/Sex: Jan 09, 1945 (74 y.o. F) Treating RN: Montey Hora Primary Care Kallum Jorgensen: Lelon Huh Other Clinician: Referring Maylie Ashton: Lelon Huh Treating Lamarkus Nebel/Extender: Melburn Hake, HOYT Weeks in Treatment: 0 Active Problems Location of Pain Severity and Description of Pain Patient Has Paino No Site Locations Pain Management and Medication Current  Pain Management: Electronic Signature(s) Signed: 04/14/2019 4:36:33 PM By: Montey Hora Entered By: Montey Hora on 04/14/2019 13:07:09 [name] ([id number]) -------------------------------------------------------------------------------- Patient/Caregiver Education Details Patient Name: [name]. Date of Service: 04/14/2019 1:00 PM Medical Record Number: [id number] Patient Account Number: [id number] Date of Birth/Gender: 1945/01/15 (74 y.o. F) Treating RN: Army Melia Primary Care Physician: Lelon Huh Other Clinician: Referring Physician: Lelon Huh Treating Physician/Extender: Melburn Hake, HOYT Weeks in Treatment: 0 Education Assessment Education Provided To: Patient Education Topics Provided Wound/Skin Impairment: Handouts: Caring for Your Ulcer Methods: Demonstration, Explain/Verbal Responses: State content correctly  Electronic Signature(s) Signed: 04/14/2019 4:09:07 PM By: Army Melia Entered By: Army Melia on 04/14/2019 13:55:14 [name] ([id number]) -------------------------------------------------------------------------------- Wound Assessment Details Patient Name: ZAKIRAH, WEINGART B. Date of Service: 04/14/2019 1:00 PM Medical Record Number: [id number] Patient Account Number: [id number] Date of Birth/Sex: 13-Feb-1945 (74 y.o. F) Treating RN: Montey Hora Primary Care Kritika Stukes: Lelon Huh Other Clinician: Referring Kolston Lacount: Lelon Huh Treating Sanika Brosious/Extender: Melburn Hake, HOYT Weeks in Treatment: 0 Wound Status Wound Number: 14 Primary Venous Leg Ulcer Etiology: Wound Location: Right, Anterior Lower Leg Secondary Diabetic Wound/Ulcer of the Lower Extremity Wounding Event: Blister Etiology: Date Acquired: 03/31/2019 Wound Open Weeks Of Treatment: 0 Status: Clustered Wound: Yes Comorbid Lymphedema, Asthma, Congestive Heart Failure, History: Coronary Artery Disease, Hypertension, Type II Diabetes, Osteoarthritis Photos Wound  Measurements Length: (cm) 2.5 Width: (cm) 9.3 Depth: (cm) 0.1 Clustered Quantity: 2 Area: (cm) 18.261 Volume: (cm) 1.826 % Reduction in Area: % Reduction in Volume: Epithelialization: Small (1-33%) Tunneling: No Undermining: No Wound Description Classification: Full Thickness Without Exposed Support Structu Wound Margin: Flat and Intact Exudate Amount: Large Exudate Type: Serous Exudate Color: amber res Foul Odor After Cleansing: No Slough/Fibrino Yes Wound Bed Granulation Amount: Large (67-100%) Exposed Structure Granulation Quality: Pink Fascia Exposed: No Necrotic Amount: Small (1-33%) Fat Layer (Subcutaneous Tissue) Exposed: Yes Necrotic Quality: Adherent Slough Tendon Exposed: No Muscle Exposed: No Joint Exposed: No Bone Exposed: No Treatment Notes Wound #14 (Right, Anterior Lower Leg) Notes LONNETTE, SHRODE B. ([id number]) silver cell, Xsorb, 3 layer bilateral Electronic Signature(s) Signed: 04/14/2019 4:36:33 PM By: Montey Hora Entered By: Montey Hora on 04/14/2019 13:25:55 [name] ([id number]) -------------------------------------------------------------------------------- Wound Assessment Details Patient Name: Jillian Albe B. Date of Service: 04/14/2019 1:00 PM Medical Record Number: [id number] Patient Account Number: [id number] Date of Birth/Sex: 09-30-1945 (74 y.o. F) Treating RN: Montey Hora Primary Care Aleksandra Raben: Lelon Huh Other Clinician: Referring Kadi Hession: Lelon Huh Treating Kalib Bhagat/Extender: Melburn Hake, HOYT Weeks in Treatment: 0 Wound Status Wound Number: 15 Primary Venous Leg Ulcer Etiology: Wound Location: Right, Lateral Lower Leg Wound Open Wounding Event: Blister Status: Date Acquired: 03/31/2019 Comorbid Lymphedema, Asthma, Congestive Heart Failure, Coronary Weeks Of Treatment: 0 History: Artery Disease, Hypertension, Type II Diabetes, Clustered Wound: No Osteoarthritis Photos Wound Measurements Length: (cm)  1.5 Width: (cm) 2 Depth: (cm) 0.1 Area: (cm) 2.356 Volume: (cm) 0.236 % Reduction in Area: % Reduction in Volume: Epithelialization: None Tunneling: No Undermining: No Wound Description Classification: Full Thickness Without Exposed Support Stru Wound Margin: Flat and Intact Exudate Amount: Large Exudate Type: Serous Exudate Color: amber ctures Foul Odor After Cleansing: No Slough/Fibrino Yes Wound Bed Granulation Amount: Large (67-100%) Exposed Structure Granulation Quality: Pink Fascia Exposed: No Necrotic Amount: Small (1-33%) Fat Layer (Subcutaneous Tissue) Exposed: Yes Necrotic Quality: Adherent Slough Tendon Exposed: No Muscle Exposed: No Joint Exposed: No Bone Exposed: No Treatment Notes Wound #15 (Right, Lateral Lower Leg) Notes silver cell, Xsorb, 3 layer bilateral Electronic Signature(s) ANGELLI, BARUCH ([id number]) Signed: 04/14/2019 4:36:33 PM By: Montey Hora Entered By: Montey Hora on 04/14/2019 13:27:11 [name] ([id number]) -------------------------------------------------------------------------------- Wound Assessment Details Patient Name: Jillian Albe B. Date of Service: 04/14/2019 1:00 PM Medical Record Number: [id number] Patient Account Number: [id number] Date of Birth/Sex: [birth year] (74 y.o. F) Treating RN: Montey Hora Primary Care Kenosha Doster: Lelon Huh Other Clinician: Referring Vihaan Gloss: Lelon Huh Treating Cote Mayabb/Extender: Melburn Hake, HOYT Weeks in Treatment: 0 Wound Status Wound Number: 16 Primary Venous Leg Ulcer Etiology: Wound Location: Left, Medial Lower Leg Secondary Diabetic Wound/Ulcer of the Lower Extremity Wounding Event: Blister  Etiology: Date Acquired: 03/31/2019 Wound Open Weeks Of Treatment: 0 Status: Clustered Wound: No Comorbid Lymphedema, Asthma, Congestive Heart Failure, History: Coronary Artery Disease, Hypertension, Type II Diabetes, Osteoarthritis Photos Wound Measurements Length: (cm)  3.2 Width: (cm) 6.4 Depth: (cm) 0.1 Area: (cm) 16.085 Volume: (cm) 1.608 % Reduction in Area: % Reduction in Volume: Epithelialization: None Tunneling: No Undermining: No Wound Description Classification: Full Thickness Without Exposed Support Stru Wound Margin: Flat and Intact Exudate Amount: Large Exudate Type: Serous Exudate Color: amber ctures Foul Odor After Cleansing: No Slough/Fibrino Yes Wound Bed Granulation Amount: Large (67-100%) Exposed Structure Granulation Quality: Pink Fascia Exposed: No Necrotic Amount: Small (1-33%) Fat Layer (Subcutaneous Tissue) Exposed: Yes Necrotic Quality: Adherent Slough Tendon Exposed: No Muscle Exposed: No Joint Exposed: No Bone Exposed: No Treatment Notes Wound #16 (Left, Medial Lower Leg) Notes silver cell, Xsorb, 3 layer bilateral ZOEIE, RITTER ([id number]) Electronic Signature(s) Signed: 04/14/2019 4:36:33 PM By: Montey Hora Entered By: Montey Hora on 04/14/2019 13:28:26 [name] ([id number]) -------------------------------------------------------------------------------- Wound Assessment Details Patient Name: Jillian Albe B. Date of Service: 04/14/2019 1:00 PM Medical Record Number: [id number] Patient Account Number: [id number] Date of Birth/Sex: [birth year] (74 y.o. F) Treating RN: Montey Hora Primary Care Arianah Torgeson: Lelon Huh Other Clinician: Referring Eulla Kochanowski: Lelon Huh Treating Dennisse Swader/Extender: Melburn Hake, HOYT Weeks in Treatment: 0 Wound Status Wound Number: 17 Primary Venous Leg Ulcer Etiology: Wound Location: Left, Anterior Lower Leg Secondary Diabetic Wound/Ulcer of the Lower Extremity Wounding Event: Blister Etiology: Date Acquired: 03/31/2019 Wound Open Weeks Of Treatment: 0 Status: Clustered Wound: Yes Comorbid Lymphedema, Asthma, Congestive Heart Failure, History: Coronary Artery Disease, Hypertension, Type II Diabetes, Osteoarthritis Photos Wound Measurements Length:  (cm) 4 Width: (cm) 13 Depth: (cm) 0.1 Clustered Quantity: 3 Area: (cm) 40.841 Volume: (cm) 4.084 % Reduction in Area: % Reduction in Volume: Epithelialization: None Tunneling: No Undermining: No Wound Description Classification: Full Thickness Without Exposed Support Structu Wound Margin: Flat and Intact Exudate Amount: Large Exudate Type: Serous Exudate Color: amber res Foul Odor After Cleansing: No Slough/Fibrino Yes Wound Bed Granulation Amount: Large (67-100%) Exposed Structure Granulation Quality: Pink Fascia Exposed: No Necrotic Amount: Small (1-33%) Fat Layer (Subcutaneous Tissue) Exposed: Yes Necrotic Quality: Adherent Slough Tendon Exposed: No Muscle Exposed: No Joint Exposed: No Bone Exposed: No Treatment Notes Wound #17 (Left, Anterior Lower Leg) Notes VIDALIA, SERPAS B. ([id number]) silver cell, Xsorb, 3 layer bilateral Electronic Signature(s) Signed: 04/14/2019 4:36:33 PM By: Montey Hora Entered By: Montey Hora on 04/14/2019 13:29:48 [name] ([id number]) -------------------------------------------------------------------------------- Wound Assessment Details Patient Name: Jillian Albe B. Date of Service: 04/14/2019 1:00 PM Medical Record Number: [id number] Patient Account Number: [id number] Date of Birth/Sex: 1945/10/20 (74 y.o. F) Treating RN: Montey Hora Primary Care Leyla Soliz: Lelon Huh Other Clinician: Referring Ralyn Stlaurent: Lelon Huh Treating Vander Kueker/Extender: Melburn Hake, HOYT Weeks in Treatment: 0 Wound Status Wound Number: 18 Primary Venous Leg Ulcer Etiology: Wound Location: Left, Lateral Lower Leg Secondary Diabetic Wound/Ulcer of the Lower Extremity Wounding Event: Blister Etiology: Date Acquired: 03/31/2019 Wound Open Weeks Of Treatment: 0 Status: Clustered Wound: Yes Comorbid Lymphedema, Asthma, Congestive Heart Failure, History: Coronary Artery Disease, Hypertension, Type II  Diabetes, Osteoarthritis Photos Wound Measurements Length: (cm) 43.2 Width: (cm) 1.8 Depth: (cm) 0.1 Clustered Quantity: 2 Area: (cm) 61.073 Volume: (cm) 6.107 % Reduction in Area: % Reduction in Volume: Epithelialization: None Tunneling: No Undermining: No Wound Description Classification: Full Thickness Without Exposed Support Structu Wound Margin: Flat and Intact Exudate Amount: Large Exudate Type: Serous Exudate Color: amber res Foul Odor After Cleansing:  No Slough/Fibrino Yes Wound Bed Granulation Amount: Large (67-100%) Exposed Structure Granulation Quality: Pink Fascia Exposed: No Necrotic Amount: Small (1-33%) Fat Layer (Subcutaneous Tissue) Exposed: Yes Necrotic Quality: Adherent Slough Tendon Exposed: No Muscle Exposed: No Joint Exposed: No Bone Exposed: No Treatment Notes Wound #18 (Left, Lateral Lower Leg) Notes JANELLI, WELLING. ([id number]) silver cell, Xsorb, 3 layer bilateral Electronic Signature(s) Signed: 04/14/2019 4:36:33 PM By: Montey Hora Entered By: Montey Hora on 04/14/2019 13:31:04 [name] ([id number]) -------------------------------------------------------------------------------- Vitals Details Patient Name: Jillian Albe B. Date of Service: 04/14/2019 1:00 PM Medical Record Number: [id number] Patient Account Number: [id number] Date of Birth/Sex: [birth year] (74 y.o. F) Treating RN: Montey Hora Primary Care Shavonna Corella: Lelon Huh Other Clinician: Referring Syria Kestner: Lelon Huh Treating Susanne Baumgarner/Extender: Melburn Hake, HOYT Weeks in Treatment: 0 Vital Signs Time Taken: 13:07 Temperature (F): 98.4 Height (in): 64 Pulse (bpm): 61 Source: Measured Respiratory Rate (breaths/min): 16 Weight (lbs): 206 Blood Pressure (mmHg): 133/61 Source: Measured Reference Range: 80 - 120 mg / dl Body Mass Index (BMI): 35.4 Electronic Signature(s) Signed: 04/14/2019 4:36:33 PM By: Montey Hora Entered By: Montey Hora on  04/14/2019 13:07:41

## 2019-04-14 NOTE — Telephone Encounter (Signed)
Spoke to patient and husband. They verbalized understanding of results. Patient wanted me to tell the remaninig instructions to husband. He is aware to stop lisinopril and other medications as discussed at the [facility]. He will have patient go to the [facility] before or after the echo on 4/16 for repeat lab work. He was appreciative and knows to f/u with PCP as well.

## 2019-04-14 NOTE — Progress Notes (Signed)
[name] ([id number]) Visit Report for 04/14/2019 Abuse/Suicide Risk Screen Details Patient Name: [name], [name]. Date of Service: 04/14/2019 1:00 PM Medical Record Number: [id number] Patient Account Number: [id number] Date of Birth/Sex: [birth year] (74 y.o. F) Treating RN: Montey Hora Primary Care Damia Bobrowski: Lelon Huh Other Clinician: Referring Jamika Sadek: Lelon Huh Treating Vincent Ehrler/Extender: Melburn Hake, HOYT Weeks in Treatment: 0 Abuse/Suicide Risk Screen Items Answer ABUSE RISK SCREEN: Has anyone close to you tried to hurt or harm you recentlyo No Do you feel uncomfortable with anyone in your familyo No Has anyone forced you do things that you didnot want to doo No Electronic Signature(s) Signed: 04/14/2019 4:36:33 PM By: Montey Hora Entered By: Montey Hora on 04/14/2019 13:10:25 [name] ([id number]) -------------------------------------------------------------------------------- Activities of Daily Living Details Patient Name: [name], [name] Date of Service: 04/14/2019 1:00 PM Medical Record Number: [id number] Patient Account Number: [id number] Date of Birth/Sex: [birth year] (74 y.o. F) Treating RN: Montey Hora Primary Care Helton Oleson: Lelon Huh Other Clinician: Referring Momoko Slezak: Lelon Huh Treating Tyreesha Maharaj/Extender: Melburn Hake, HOYT Weeks in Treatment: 0 Activities of Daily Living Items Answer Activities of Daily Living (Please select one for each item) Drive Automobile Not Able Take Medications Need Assistance Use Telephone Completely Able Care for Appearance Need Assistance Use Toilet Need Assistance Bath / Shower Need Assistance Dress Self Need Assistance Feed Self Completely Able Walk Need Assistance Get In / Out Bed Need Assistance Housework Need Assistance Prepare Meals Need Assistance Handle Money Need Assistance Shop for Self Need Assistance Electronic Signature(s) Signed: 04/14/2019 4:36:33 PM By: Montey Hora Entered  By: Montey Hora on 04/14/2019 13:11:09 [name] ([id number]) -------------------------------------------------------------------------------- Education Screening Details Patient Name: Jillian Albe B. Date of Service: 04/14/2019 1:00 PM Medical Record Number: [id number] Patient Account Number: [id number] Date of Birth/Sex: 01/27/1945 (74 y.o. F) Treating RN: Montey Hora Primary Care Kieon Lawhorn: Lelon Huh Other Clinician: Referring Lakeeta Dobosz: Lelon Huh Treating Marquel Pottenger/Extender: Melburn Hake, HOYT Weeks in Treatment: 0 Primary Learner Assessed: Patient Learning Preferences/Education Level/Primary Language Learning Preference: Explanation, Demonstration Highest Education Level: High School Preferred Language: English Cognitive Barrier Language Barrier: No Translator Needed: No Memory Deficit: No Emotional Barrier: No Cultural/Religious Beliefs Affecting Medical Care: No Physical Barrier Impaired Vision: No Impaired Hearing: No Decreased Hand dexterity: No Knowledge/Comprehension Knowledge Level: Medium Comprehension Level: Medium Ability to understand written instructions: Medium Ability to understand verbal instructions: Medium Motivation Anxiety Level: Calm Cooperation: Cooperative Education Importance: Acknowledges Need Interest in Health Problems: Asks Questions Perception: Coherent Willingness to Engage in Self-Management Medium Activities: Readiness to Engage in Self-Management Medium Activities: Electronic Signature(s) Signed: 04/14/2019 4:36:33 PM By: Montey Hora Entered By: Montey Hora on 04/14/2019 13:11:29 [name] ([id number]) -------------------------------------------------------------------------------- Fall Risk Assessment Details Patient Name: Jillian Albe B. Date of Service: 04/14/2019 1:00 PM Medical Record Number: [id number] Patient Account Number: [id number] Date of Birth/Sex: [birth year] (74 y.o. F) Treating RN: Montey Hora Primary Care Blakelee Allington: Lelon Huh Other Clinician: Referring Liann Spaeth: Lelon Huh Treating Brian Kocourek/Extender: Melburn Hake, HOYT Weeks in Treatment: 0 Fall Risk Assessment Items Have you had 2 or more falls in the last 12 monthso 0 Yes Have you had any fall that resulted in injury in the last 12 monthso 0 No FALLS RISK SCREEN History of falling - immediate or within 3 months 25 Yes Secondary diagnosis (Do you have 2 or more medical diagnoseso) 0 No Ambulatory aid None/bed rest/wheelchair/nurse 0 Yes Crutches/cane/walker 0 No Furniture 0 No Intravenous therapy Access/Saline/Heparin Lock 0 No Gait/Transferring Normal/ bed rest/ wheelchair 0 Yes Weak (short steps  with or without shuffle, stooped but able to lift head while walking, may seek 0 No support from furniture) Impaired (short steps with shuffle, may have difficulty arising from chair, head down, impaired 0 No balance) Mental Status Oriented to own ability 0 Yes Electronic Signature(s) Signed: 04/14/2019 4:36:33 PM By: Montey Hora Entered By: Montey Hora on 04/14/2019 13:12:21 [name] ([id number]) -------------------------------------------------------------------------------- Foot Assessment Details Patient Name: Jillian Albe B. Date of Service: 04/14/2019 1:00 PM Medical Record Number: [id number] Patient Account Number: [id number] Date of Birth/Sex: 06-20-1945 (74 y.o. F) Treating RN: Montey Hora Primary Care Jung Yurchak: Lelon Huh Other Clinician: Referring Budd Freiermuth: Lelon Huh Treating Vola Beneke/Extender: Melburn Hake, HOYT Weeks in Treatment: 0 Foot Assessment Items Site Locations + = Sensation present, - = Sensation absent, C = Callus, U = Ulcer R = Redness, W = Warmth, M = Maceration, PU = Pre-ulcerative lesion F = Fissure, S = Swelling, D = Dryness Assessment Right: Left: Other Deformity: No No Prior Foot Ulcer: No No Prior Amputation: No No Charcot Joint: No No Ambulatory  Status: Non-ambulatory Assistance Device: Wheelchair GaitEnergy manager) Signed: 04/14/2019 4:36:33 PM By: Montey Hora Entered By: Montey Hora on 04/14/2019 13:12:39 [name] ([id number]) -------------------------------------------------------------------------------- Nutrition Risk Screening Details Patient Name: Jillian Albe B. Date of Service: 04/14/2019 1:00 PM Medical Record Number: [id number] Patient Account Number: [id number] Date of Birth/Sex: March 23, 1945 (74 y.o. F) Treating RN: Montey Hora Primary Care Kamorie Aldous: Lelon Huh Other Clinician: Referring Venisha Boehning: Lelon Huh Treating Vayla Wilhelmi/Extender: Melburn Hake, HOYT Weeks in Treatment: 0 Height (in): 64 Weight (lbs): 206 Body Mass Index (BMI): 35.4 Nutrition Risk Screening Items Score Screening NUTRITION RISK SCREEN: I have an illness or condition that made me change the kind and/or amount of food I eat 0 No I eat fewer than two meals per day 0 No I eat few fruits and vegetables, or milk products 0 No I have three or more drinks of beer, liquor or wine almost every day 0 No I have tooth or mouth problems that make it hard for me to eat 0 No I don't always have enough money to buy the food I need 0 No I eat alone most of the time 0 No I take three or more different prescribed or over-the-counter drugs a day 1 Yes Without wanting to, I have lost or gained 10 pounds in the last six months 0 No I am not always physically able to shop, cook and/or feed myself 0 No Nutrition Protocols Good Risk Protocol 0 No interventions needed Moderate Risk Protocol High Risk Proctocol Risk Level: Good Risk Score: 1 Electronic Signature(s) Signed: 04/14/2019 4:36:33 PM By: Montey Hora Entered By: Montey Hora on 04/14/2019 13:12:27

## 2019-04-17 ENCOUNTER — Other Ambulatory Visit: Payer: Self-pay

## 2019-04-17 DIAGNOSIS — Z880 Allergy status to penicillin: Secondary | ICD-10-CM | POA: Diagnosis not present

## 2019-04-17 DIAGNOSIS — L97222 Non-pressure chronic ulcer of left calf with fat layer exposed: Secondary | ICD-10-CM | POA: Diagnosis not present

## 2019-04-17 DIAGNOSIS — E11622 Type 2 diabetes mellitus with other skin ulcer: Secondary | ICD-10-CM | POA: Diagnosis not present

## 2019-04-17 DIAGNOSIS — I890 Lymphedema, not elsewhere classified: Secondary | ICD-10-CM | POA: Diagnosis not present

## 2019-04-17 DIAGNOSIS — Z7984 Long term (current) use of oral hypoglycemic drugs: Secondary | ICD-10-CM | POA: Diagnosis not present

## 2019-04-17 DIAGNOSIS — L97212 Non-pressure chronic ulcer of right calf with fat layer exposed: Secondary | ICD-10-CM | POA: Diagnosis not present

## 2019-04-17 DIAGNOSIS — Z993 Dependence on wheelchair: Secondary | ICD-10-CM | POA: Diagnosis not present

## 2019-04-17 DIAGNOSIS — Z888 Allergy status to other drugs, medicaments and biological substances status: Secondary | ICD-10-CM | POA: Diagnosis not present

## 2019-04-17 NOTE — Progress Notes (Signed)
DYLAN, RUOTOLO ([id number]) Visit Report for 04/17/2019 Arrival Information Details Patient Name: [name], [name]. Date of Service: 04/17/2019 11:00 AM Medical Record Number: [id number] Patient Account Number: [id number] Date of Birth/Sex: 24-Jan-1945 (74 y.o. F) Treating RN: Montey Hora Primary Care Brieanne Mignone: Lelon Huh Other Clinician: Referring Quillan Whitter: Lelon Huh Treating Meshulem Onorato/Extender: Melburn Hake, HOYT Weeks in Treatment: 0 Visit Information History Since Last Visit Added or deleted any medications: No Patient Arrived: Wheel Chair Any new allergies or adverse reactions: No Arrival Time: 11:00 Had a fall or experienced change in No Accompanied By: spouse activities of daily living that may affect Transfer Assistance: None risk of falls: Patient Identification Verified: Yes Signs or symptoms of abuse/neglect since last visito No Secondary Verification Process Completed: Yes Hospitalized since last visit: No Implantable device outside of the clinic excluding No cellular tissue based products placed in the center since last visit: Has Dressing in Place as Prescribed: Yes Has Compression in Place as Prescribed: Yes Pain Present Now: No Notes 98.3 Electronic Signature(s) Signed: 04/17/2019 11:28:31 AM By: Montey Hora Previous Signature: 04/17/2019 11:27:02 AM Version By: Montey Hora Entered By: Montey Hora on 04/17/2019 11:28:30 [name] ([id number]) -------------------------------------------------------------------------------- Compression Therapy Details Patient Name: Jillian Albe B. Date of Service: 04/17/2019 11:00 AM Medical Record Number: [id number] Patient Account Number: [id number] Date of Birth/Sex: [birth year] (74 y.o. F) Treating RN: Montey Hora Primary Care Asiel Chrostowski: Lelon Huh Other Clinician: Referring Zaheer Wageman: Lelon Huh Treating Lurlie Wigen/Extender: Melburn Hake, HOYT Weeks in Treatment: 0 Compression Therapy Performed for  Wound Assessment: Wound #14 Right,Anterior Lower Leg Performed By: Clinician Montey Hora, RN Compression Type: Three Layer Electronic Signature(s) Signed: 04/17/2019 11:29:36 AM By: Montey Hora Entered By: Montey Hora on 04/17/2019 11:29:35 [name] ([id number]) -------------------------------------------------------------------------------- Compression Therapy Details Patient Name: [name], Jillian B. Date of Service: 04/17/2019 11:00 AM Medical Record Number: [id number] Patient Account Number: [id number] Date of Birth/Sex: [birth year] (74 y.o. F) Treating RN: Montey Hora Primary Care Shawne Bulow: Lelon Huh Other Clinician: Referring Quint Chestnut: Lelon Huh Treating Starling Christofferson/Extender: Melburn Hake, HOYT Weeks in Treatment: 0 Compression Therapy Performed for Wound Assessment: Wound #15 Right,Lateral Lower Leg Performed By: Clinician Montey Hora, RN Compression Type: Three Layer Electronic Signature(s) Signed: 04/17/2019 11:29:36 AM By: Montey Hora Entered By: Montey Hora on 04/17/2019 11:29:36 [name] ([id number]) -------------------------------------------------------------------------------- Compression Therapy Details Patient Name: [name], Jillian B. Date of Service: 04/17/2019 11:00 AM Medical Record Number: [id number] Patient Account Number: [id number] Date of Birth/Sex: [birth year] (74 y.o. F) Treating RN: Montey Hora Primary Care Avir Deruiter: Lelon Huh Other Clinician: Referring Caylie Sandquist: Lelon Huh Treating Claudie Brickhouse/Extender: Melburn Hake, HOYT Weeks in Treatment: 0 Compression Therapy Performed for Wound Assessment: Wound #16 Left,Medial Lower Leg Performed By: Clinician Montey Hora, RN Compression Type: Three Layer Electronic Signature(s) Signed: 04/17/2019 11:29:36 AM By: Montey Hora Entered By: Montey Hora on 04/17/2019 11:29:36 [name]  ([id number]) -------------------------------------------------------------------------------- Compression Therapy Details Patient Name: [name], Jillian B. Date of Service: 04/17/2019 11:00 AM Medical Record Number: [id number] Patient Account Number: [id number] Date of Birth/Sex: 09-23-1945 (74 y.o. F) Treating RN: Montey Hora Primary Care Konnie Noffsinger: Lelon Huh Other Clinician: Referring Allyssia Skluzacek: Lelon Huh Treating Rafeef Lau/Extender: Melburn Hake, HOYT Weeks in Treatment: 0 Compression Therapy Performed for Wound Assessment: Wound #17 Left,Anterior Lower Leg Performed By: Clinician Montey Hora, RN Compression Type: Three Layer Electronic Signature(s) Signed: 04/17/2019 11:29:37 AM By: Montey Hora Entered By: Montey Hora on 04/17/2019 11:29:36 [name] ([id number]) -------------------------------------------------------------------------------- Compression Therapy Details Patient Name: [name], Jillian B. Date of Service: 04/17/2019 11:00 AM Medical  Record Number: [id number] Patient Account Number: [id number] Date of Birth/Sex: [birth year] (74 y.o. F) Treating RN: Montey Hora Primary Care Hollie Bartus: Lelon Huh Other Clinician: Referring Dajahnae Vondra: Lelon Huh Treating Breydan Shillingburg/Extender: Melburn Hake, HOYT Weeks in Treatment: 0 Compression Therapy Performed for Wound Assessment: Wound #18 Left,Lateral Lower Leg Performed By: Clinician Montey Hora, RN Compression Type: Three Layer Electronic Signature(s) Signed: 04/17/2019 11:29:37 AM By: Montey Hora Entered By: Montey Hora on 04/17/2019 11:29:37 [name] ([id number]) -------------------------------------------------------------------------------- Encounter Discharge Information Details Patient Name: [name], Jillian B. Date of Service: 04/17/2019 11:00 AM Medical Record Number: [id number] Patient Account Number: [id number] Date of Birth/Sex: [birth year] (74 y.o. F) Treating RN: Montey Hora Primary Care  Emrys Mceachron: Lelon Huh Other Clinician: Referring Kristoph Sattler: Lelon Huh Treating Lashawn Bromwell/Extender: Melburn Hake, HOYT Weeks in Treatment: 0 Encounter Discharge Information Items Discharge Condition: Stable Ambulatory Status: Wheelchair Discharge Destination: Home Transportation: Private Auto Accompanied By: spouse Schedule Follow-up Appointment: Yes Clinical Summary of Care: Electronic Signature(s) Signed: 04/17/2019 11:30:25 AM By: Montey Hora Entered By: Montey Hora on 04/17/2019 11:30:25 [name] ([id number]) -------------------------------------------------------------------------------- Wound Assessment Details Patient Name: [name], Jillian B. Date of Service: 04/17/2019 11:00 AM Medical Record Number: [id number] Patient Account Number: [id number] Date of Birth/Sex: 06-03-1945 (74 y.o. F) Treating RN: Montey Hora Primary Care Tyriq Moragne: Lelon Huh Other Clinician: Referring Dequincy Born: Lelon Huh Treating Ellinore Merced/Extender: Melburn Hake, HOYT Weeks in Treatment: 0 Wound Status Wound Number: 14 Primary Venous Leg Ulcer Etiology: Wound Location: Right, Anterior Lower Leg Secondary Diabetic Wound/Ulcer of the Lower Extremity Wounding Event: Blister Etiology: Date Acquired: 03/31/2019 Wound Open Weeks Of Treatment: 0 Status: Clustered Wound: Yes Comorbid Lymphedema, Asthma, Congestive Heart Failure, History: Coronary Artery Disease, Hypertension, Type II Diabetes, Osteoarthritis Wound Measurements Length: (cm) 2.5 Width: (cm) 9.3 Depth: (cm) 0.1 Clustered Quantity: 2 Area: (cm) 18.261 Volume: (cm) 1.826 % Reduction in Area: 0% % Reduction in Volume: 0% Epithelialization: Small (1-33%) Tunneling: No Undermining: No Wound Description Classification: Full Thickness Without Exposed Support Stru Wound Margin: Flat and Intact Exudate Amount: Large Exudate Type: Serous Exudate Color: amber ctures Foul Odor After Cleansing: No Slough/Fibrino  Yes Wound Bed Granulation Amount: Large (67-100%) Exposed Structure Granulation Quality: Pink Fascia Exposed: No Necrotic Amount: Small (1-33%) Fat Layer (Subcutaneous Tissue) Exposed: Yes Necrotic Quality: Adherent Slough Tendon Exposed: No Muscle Exposed: No Joint Exposed: No Bone Exposed: No Treatment Notes Wound #14 (Right, Anterior Lower Leg) Notes silver cell, Xsorb, 3 layer bilateral Electronic Signature(s) Signed: 04/17/2019 11:28:04 AM By: Montey Hora Entered By: Montey Hora on 04/17/2019 11:28:04 [name] ([id number]) -------------------------------------------------------------------------------- Wound Assessment Details Patient Name: [name], Jillian B. Date of Service: 04/17/2019 11:00 AM Medical Record Number: [id number] Patient Account Number: [id number] Date of Birth/Sex: 26-Jun-1945 (74 y.o. F) Treating RN: Montey Hora Primary Care Emelie Newsom: Lelon Huh Other Clinician: Referring Chrystian Ressler: Lelon Huh Treating Waymon Laser/Extender: Melburn Hake, HOYT Weeks in Treatment: 0 Wound Status Wound Number: 15 Primary Venous Leg Ulcer Etiology: Wound Location: Right, Lateral Lower Leg Wound Open Wounding Event: Blister Status: Date Acquired: 03/31/2019 Comorbid Lymphedema, Asthma, Congestive Heart Failure, Weeks Of Treatment: 0 History: Coronary Artery Disease, Hypertension, Type II Diabetes, Clustered Wound: No Osteoarthritis Wound Measurements Length: (cm) 1.5 Width: (cm) 2 Depth: (cm) 0.1 Area: (cm) 2.356 Volume: (cm) 0.236 % Reduction in Area: 0% % Reduction in Volume: 0% Epithelialization: None Wound Description Classification: Full Thickness Without Exposed Support Stru Wound Margin: Flat and Intact Exudate Amount: Large Exudate Type: Serous Exudate Color: amber ctures Foul Odor After Cleansing: No Slough/Fibrino Yes Wound Bed Granulation Amount:  Large (67-100%) Exposed Structure Granulation Quality: Pink Fascia Exposed:  No Necrotic Amount: Small (1-33%) Fat Layer (Subcutaneous Tissue) Exposed: Yes Necrotic Quality: Adherent Slough Tendon Exposed: No Muscle Exposed: No Joint Exposed: No Bone Exposed: No Treatment Notes Wound #15 (Right, Lateral Lower Leg) Notes silver cell, Xsorb, 3 layer bilateral Electronic Signature(s) Signed: 04/17/2019 11:28:17 AM By: Montey Hora Entered By: Montey Hora on 04/17/2019 11:28:17 [name] ([id number]) -------------------------------------------------------------------------------- Wound Assessment Details Patient Name: Jillian Albe B. Date of Service: 04/17/2019 11:00 AM Medical Record Number: [id number] Patient Account Number: [id number] Date of Birth/Sex: 07/27/1945 (74 y.o. F) Treating RN: Montey Hora Primary Care Roberta Kelly: Lelon Huh Other Clinician: Referring Ayda Tancredi: Lelon Huh Treating Nussen Pullin/Extender: Melburn Hake, HOYT Weeks in Treatment: 0 Wound Status Wound Number: 16 Primary Venous Leg Ulcer Etiology: Wound Location: Left, Medial Lower Leg Secondary Diabetic Wound/Ulcer of the Lower Extremity Wounding Event: Blister Etiology: Date Acquired: 03/31/2019 Wound Open Weeks Of Treatment: 0 Status: Clustered Wound: No Comorbid Lymphedema, Asthma, Congestive Heart Failure, History: Coronary Artery Disease, Hypertension, Type II Diabetes, Osteoarthritis Wound Measurements Length: (cm) 3.2 Width: (cm) 6.4 Depth: (cm) 0.1 Area: (cm) 16.085 Volume: (cm) 1.608 % Reduction in Area: 0% % Reduction in Volume: 0% Epithelialization: None Wound Description Classification: Full Thickness Without Exposed Support Stru Wound Margin: Flat and Intact Exudate Amount: Large Exudate Type: Serous Exudate Color: amber ctures Foul Odor After Cleansing: No Slough/Fibrino Yes Wound Bed Granulation Amount: Large (67-100%) Exposed Structure Granulation Quality: Pink Fascia Exposed: No Necrotic Amount: Small (1-33%) Fat Layer (Subcutaneous  Tissue) Exposed: Yes Necrotic Quality: Adherent Slough Tendon Exposed: No Muscle Exposed: No Joint Exposed: No Bone Exposed: No Treatment Notes Wound #16 (Left, Medial Lower Leg) Notes silver cell, Xsorb, 3 layer bilateral Electronic Signature(s) Signed: 04/17/2019 11:28:46 AM By: Montey Hora Entered By: Montey Hora on 04/17/2019 11:28:45 [name] ([id number]) -------------------------------------------------------------------------------- Wound Assessment Details Patient Name: Jillian Albe B. Date of Service: 04/17/2019 11:00 AM Medical Record Number: [id number] Patient Account Number: [id number] Date of Birth/Sex: 08/03/1945 (74 y.o. F) Treating RN: Montey Hora Primary Care Chalene Treu: Lelon Huh Other Clinician: Referring Tayveon Lombardo: Lelon Huh Treating Viviane Semidey/Extender: Melburn Hake, HOYT Weeks in Treatment: 0 Wound Status Wound Number: 17 Primary Venous Leg Ulcer Etiology: Wound Location: Left, Anterior Lower Leg Secondary Diabetic Wound/Ulcer of the Lower Extremity Wounding Event: Blister Etiology: Date Acquired: 03/31/2019 Wound Open Weeks Of Treatment: 0 Status: Clustered Wound: Yes Comorbid Lymphedema, Asthma, Congestive Heart Failure, History: Coronary Artery Disease, Hypertension, Type II Diabetes, Osteoarthritis Wound Measurements Length: (cm) 4 Width: (cm) 13 Depth: (cm) 0.1 Clustered Quantity: 3 Area: (cm) 40.841 Volume: (cm) 4.084 % Reduction in Area: 0% % Reduction in Volume: 0% Epithelialization: None Wound Description Classification: Full Thickness Without Exposed Support Stru Wound Margin: Flat and Intact Exudate Amount: Large Exudate Type: Serous Exudate Color: amber ctures Foul Odor After Cleansing: No Slough/Fibrino Yes Wound Bed Granulation Amount: Large (67-100%) Exposed Structure Granulation Quality: Pink Fascia Exposed: No Necrotic Amount: Small (1-33%) Fat Layer (Subcutaneous Tissue) Exposed: Yes Necrotic  Quality: Adherent Slough Tendon Exposed: No Muscle Exposed: No Joint Exposed: No Bone Exposed: No Treatment Notes Wound #17 (Left, Anterior Lower Leg) Notes silver cell, Xsorb, 3 layer bilateral Electronic Signature(s) Signed: 04/17/2019 11:28:56 AM By: Montey Hora Entered By: Montey Hora on 04/17/2019 11:28:55 [name] ([id number]) -------------------------------------------------------------------------------- Wound Assessment Details Patient Name: Jillian Albe B. Date of Service: 04/17/2019 11:00 AM Medical Record Number: [id number] Patient Account Number: [id number] Date of Birth/Sex: 29-Dec-1945 (74 y.o. F) Treating RN: Montey Hora Primary  Care Mitsuko Luera: Lelon Huh Other Clinician: Referring Flora Parks: Lelon Huh Treating Isaid Salvia/Extender: Melburn Hake, HOYT Weeks in Treatment: 0 Wound Status Wound Number: 18 Primary Venous Leg Ulcer Etiology: Wound Location: Left, Lateral Lower Leg Secondary Diabetic Wound/Ulcer of the Lower Extremity Wounding Event: Blister Etiology: Date Acquired: 03/31/2019 Wound Open Weeks Of Treatment: 0 Status: Clustered Wound: Yes Comorbid Lymphedema, Asthma, Congestive Heart Failure, History: Coronary Artery Disease, Hypertension, Type II Diabetes, Osteoarthritis Wound Measurements Length: (cm) 43.2 Width: (cm) 1.8 Depth: (cm) 0.1 Clustered Quantity: 2 Area: (cm) 61.073 Volume: (cm) 6.107 % Reduction in Area: 0% % Reduction in Volume: 0% Epithelialization: None Wound Description Classification: Full Thickness Without Exposed Support Stru Wound Margin: Flat and Intact Exudate Amount: Large Exudate Type: Serous Exudate Color: amber ctures Foul Odor After Cleansing: No Slough/Fibrino Yes Wound Bed Granulation Amount: Large (67-100%) Exposed Structure Granulation Quality: Pink Fascia Exposed: No Necrotic Amount: Small (1-33%) Fat Layer (Subcutaneous Tissue) Exposed: Yes Necrotic Quality: Adherent Slough Tendon  Exposed: No Muscle Exposed: No Joint Exposed: No Bone Exposed: No Treatment Notes Wound #18 (Left, Lateral Lower Leg) Notes silver cell, Xsorb, 3 layer bilateral Electronic Signature(s) Signed: 04/17/2019 11:29:08 AM By: Montey Hora Entered By: Montey Hora on 04/17/2019 11:29:07

## 2019-04-18 ENCOUNTER — Ambulatory Visit (INDEPENDENT_AMBULATORY_CARE_PROVIDER_SITE_OTHER): Payer: Medicare Other

## 2019-04-18 ENCOUNTER — Other Ambulatory Visit
Admission: RE | Admit: 2019-04-18 | Discharge: 2019-04-18 | Disposition: A | Discharge status: Home / Self Care | Payer: Medicare Other | Source: Ambulatory Visit | Attending: Internal Medicine | Admitting: Internal Medicine

## 2019-04-18 DIAGNOSIS — Z79899 Other long term (current) drug therapy: Secondary | ICD-10-CM | POA: Diagnosis not present

## 2019-04-18 DIAGNOSIS — R600 Localized edema: Secondary | ICD-10-CM | POA: Diagnosis not present

## 2019-04-18 DIAGNOSIS — I5022 Chronic systolic (congestive) heart failure: Secondary | ICD-10-CM | POA: Insufficient documentation

## 2019-04-18 DIAGNOSIS — I350 Nonrheumatic aortic (valve) stenosis: Secondary | ICD-10-CM | POA: Diagnosis not present

## 2019-04-18 LAB — BASIC METABOLIC PANEL
Anion gap: 6 (ref 5–15)
BUN: 27 mg/dL — ABNORMAL HIGH (ref 8–23)
CO2: 27 mmol/L (ref 22–32)
Calcium: 9.2 mg/dL (ref 8.9–10.3)
Chloride: 104 mmol/L (ref 98–111)
Creatinine, Ser: 1.86 mg/dL — ABNORMAL HIGH (ref 0.44–1.00)
GFR calc Af Amer: 30 mL/min — ABNORMAL LOW (ref >60)
GFR calc non Af Amer: 26 mL/min — ABNORMAL LOW (ref >60)
Glucose, Bld: 219 mg/dL — ABNORMAL HIGH (ref 70–99)
Potassium: 4.1 mmol/L (ref 3.5–5.1)
Sodium: 137 mmol/L (ref 135–145)

## 2019-04-21 ENCOUNTER — Other Ambulatory Visit: Payer: Self-pay

## 2019-04-21 DIAGNOSIS — L97222 Non-pressure chronic ulcer of left calf with fat layer exposed: Secondary | ICD-10-CM | POA: Diagnosis not present

## 2019-04-21 DIAGNOSIS — Z888 Allergy status to other drugs, medicaments and biological substances status: Secondary | ICD-10-CM | POA: Diagnosis not present

## 2019-04-21 DIAGNOSIS — Z7984 Long term (current) use of oral hypoglycemic drugs: Secondary | ICD-10-CM | POA: Diagnosis not present

## 2019-04-21 DIAGNOSIS — Z880 Allergy status to penicillin: Secondary | ICD-10-CM | POA: Diagnosis not present

## 2019-04-21 DIAGNOSIS — Z993 Dependence on wheelchair: Secondary | ICD-10-CM | POA: Diagnosis not present

## 2019-04-21 DIAGNOSIS — E11622 Type 2 diabetes mellitus with other skin ulcer: Secondary | ICD-10-CM | POA: Diagnosis not present

## 2019-04-21 DIAGNOSIS — L97212 Non-pressure chronic ulcer of right calf with fat layer exposed: Secondary | ICD-10-CM | POA: Diagnosis not present

## 2019-04-21 DIAGNOSIS — I890 Lymphedema, not elsewhere classified: Secondary | ICD-10-CM | POA: Diagnosis not present

## 2019-04-21 NOTE — Progress Notes (Signed)
[name], [name] ([id number]) Visit Report for 04/21/2019 Arrival Information Details Patient Name: [name], [name]. Date of Service: 04/21/2019 9:30 AM Medical Record Number: [id number] Patient Account Number: [id number] Date of Birth/Sex: Aug 14, 1945 (74 y.o. F) Treating RN: Montey Hora Primary Care Tanaysha Alkins: Lelon Huh Other Clinician: Referring Shyane Fossum: Lelon Huh Treating Devany Aja/Extender: Melburn Hake, HOYT Weeks in Treatment: 1 Visit Information History Since Last Visit Added or deleted any medications: No Patient Arrived: Wheel Chair Any new allergies or adverse reactions: No Arrival Time: 09:18 Had a fall or experienced change in No Accompanied By: spouse activities of daily living that may affect Transfer Assistance: None risk of falls: Patient Identification Verified: Yes Signs or symptoms of abuse/neglect since last visito No Secondary Verification Process Completed: Yes Hospitalized since last visit: No Implantable device outside of the clinic excluding No cellular tissue based products placed in the center since last visit: Has Dressing in Place as Prescribed: Yes Has Compression in Place as Prescribed: Yes Pain Present Now: No Notes 97.8 Electronic Signature(s) Signed: 04/21/2019 12:02:39 PM By: Montey Hora Entered By: Montey Hora on 04/21/2019 09:20:09 [name] ([id number]) -------------------------------------------------------------------------------- Compression Therapy Details Patient Name: [name] B. Date of Service: 04/21/2019 9:30 AM Medical Record Number: [id number] Patient Account Number: [id number] Date of Birth/Sex: 09-11-1945 (74 y.o. F) Treating RN: Montey Hora Primary Care Katee Wentland: Lelon Huh Other Clinician: Referring Gunda Maqueda: Lelon Huh Treating Clyda Smyth/Extender: Melburn Hake, HOYT Weeks in Treatment: 1 Compression Therapy Performed for Wound Assessment: Wound #14 Right,Anterior Lower Leg Performed By:  Clinician Montey Hora, RN Compression Type: Three Layer Electronic Signature(s) Signed: 04/21/2019 12:02:39 PM By: Montey Hora Entered By: Montey Hora on 04/21/2019 09:42:37 [name] ([id number]) -------------------------------------------------------------------------------- Compression Therapy Details Patient Name: [name], Jillian B. Date of Service: 04/21/2019 9:30 AM Medical Record Number: [id number] Patient Account Number: [id number] Date of Birth/Sex: August 25, 1945 (74 y.o. F) Treating RN: Montey Hora Primary Care Jyoti Harju: Lelon Huh Other Clinician: Referring Carrera Kiesel: Lelon Huh Treating Amiera Herzberg/Extender: Melburn Hake, HOYT Weeks in Treatment: 1 Compression Therapy Performed for Wound Assessment: Wound #17 Left,Anterior Lower Leg Performed By: Clinician Montey Hora, RN Compression Type: Three Layer Electronic Signature(s) Signed: 04/21/2019 12:02:39 PM By: Montey Hora Entered By: Montey Hora on 04/21/2019 09:42:37 [name] ([id number]) -------------------------------------------------------------------------------- Encounter Discharge Information Details Patient Name: [name], Jillian B. Date of Service: 04/21/2019 9:30 AM Medical Record Number: [id number] Patient Account Number: [id number] Date of Birth/Sex: [birth year] (74 y.o. F) Treating RN: Montey Hora Primary Care Charma Mocarski: Lelon Huh Other Clinician: Referring Charnel Giles: Lelon Huh Treating Zarrah Loveland/Extender: Melburn Hake, HOYT Weeks in Treatment: 1 Encounter Discharge Information Items Discharge Condition: Stable Ambulatory Status: Wheelchair Discharge Destination: Home Transportation: Private Auto Accompanied By: spouse Schedule Follow-up Appointment: Yes Clinical Summary of Care: Electronic Signature(s) Signed: 04/21/2019 12:02:39 PM By: Montey Hora Entered By: Montey Hora on 04/21/2019 09:43:34 Remington, Jeanice Lim  ([id number]) -------------------------------------------------------------------------------- Wound Assessment Details Patient Name: [name], Jillian B. Date of Service: 04/21/2019 9:30 AM Medical Record Number: [id number] Patient Account Number: [id number] Date of Birth/Sex: 22-Dec-1945 (74 y.o. F) Treating RN: Montey Hora Primary Care Laniah Grimm: Lelon Huh Other Clinician: Referring Seymore Brodowski: Lelon Huh Treating Gentle Hoge/Extender: Melburn Hake, HOYT Weeks in Treatment: 1 Wound Status Wound Number: 14 Primary Venous Leg Ulcer Etiology: Wound Location: Right, Anterior Lower Leg Secondary Diabetic Wound/Ulcer of the Lower Extremity Wounding Event: Blister Etiology: Date Acquired: 03/31/2019 Wound Open Weeks Of Treatment: 1 Status: Clustered Wound: Yes Comorbid Lymphedema, Asthma, Congestive Heart Failure, History: Coronary Artery Disease, Hypertension, Type II Diabetes, Osteoarthritis Photos Wound Measurements Length: (  cm) 11 Width: (cm) 16 Depth: (cm) 0.1 Clustered Quantity: 2 Area: (cm) 138.23 Volume: (cm) 13.823 % Reduction in Area: -657% % Reduction in Volume: -657% Epithelialization: Small (1-33%) Tunneling: No Undermining: No Wound Description Classification: Full Thickness Without Exposed Support Structu Wound Margin: Flat and Intact Exudate Amount: Large Exudate Type: Serous Exudate Color: amber res Foul Odor After Cleansing: No Slough/Fibrino Yes Wound Bed Granulation Amount: Large (67-100%) Exposed Structure Granulation Quality: Pink Fascia Exposed: No Necrotic Amount: Small (1-33%) Fat Layer (Subcutaneous Tissue) Exposed: Yes Necrotic Quality: Adherent Slough Tendon Exposed: No Muscle Exposed: No Joint Exposed: No Bone Exposed: No Treatment Notes Wound #14 (Right, Anterior Lower Leg) Notes [name], Jillian B. ([id number]) silver cell, Xsorb, 3 layer bilateral Electronic Signature(s) Signed: 04/21/2019 12:02:39 PM By: Montey Hora Entered By:  Montey Hora on 04/21/2019 09:41:17 [name] ([id number]) -------------------------------------------------------------------------------- Wound Assessment Details Patient Name: [name] B. Date of Service: 04/21/2019 9:30 AM Medical Record Number: [id number] Patient Account Number: [id number] Date of Birth/Sex: Sep 07, 1945 (74 y.o. F) Treating RN: Montey Hora Primary Care Ravin Bendall: Lelon Huh Other Clinician: Referring Jekhi Bolin: Lelon Huh Treating Toni Hoffmeister/Extender: Melburn Hake, HOYT Weeks in Treatment: 1 Wound Status Wound Number: 15 Primary Etiology: Venous Leg Ulcer Wound Location: Right, Lateral Lower Leg Wound Status: Converted Wounding Event: Blister Date Acquired: 03/31/2019 Weeks Of Treatment: 1 Clustered Wound: No Wound Measurements Length: (cm) Width: (cm) Depth: (cm) Area: (cm) Volume: (cm) 0 % Reduction in Area: 100% 0 % Reduction in Volume: 100% 0 0 0 Wound Description Classification: Full Thickness Without Exposed Support Structure s Electronic Signature(s) Signed: 04/21/2019 12:02:39 PM By: Montey Hora Entered By: Montey Hora on 04/21/2019 09:26:10 [name] ([id number]) -------------------------------------------------------------------------------- Wound Assessment Details Patient Name: [name] B. Date of Service: 04/21/2019 9:30 AM Medical Record Number: [id number] Patient Account Number: [id number] Date of Birth/Sex: 08-05-1945 (74 y.o. F) Treating RN: Montey Hora Primary Care Ricke Kimoto: Lelon Huh Other Clinician: Referring Caela Huot: Lelon Huh Treating Dianey Suchy/Extender: Melburn Hake, HOYT Weeks in Treatment: 1 Wound Status Wound Number: 16 Primary Etiology: Venous Leg Ulcer Wound Location: Left, Medial Lower Leg Secondary Etiology: Diabetic Wound/Ulcer of the Lower Extremity Wounding Event: Blister Wound Status: Converted Date Acquired: 03/31/2019 Weeks Of Treatment: 1 Clustered Wound: No Wound  Measurements Length: (cm) Width: (cm) Depth: (cm) Area: (cm) Volume: (cm) 0 % Reduction in Area: 100% 0 % Reduction in Volume: 100% 0 0 0 Wound Description Classification: Full Thickness Without Exposed Support Structure s Electronic Signature(s) Signed: 04/21/2019 12:02:39 PM By: Montey Hora Entered By: Montey Hora on 04/21/2019 09:26:10 [name] ([id number]) -------------------------------------------------------------------------------- Wound Assessment Details Patient Name: [name] B. Date of Service: 04/21/2019 9:30 AM Medical Record Number: [id number] Patient Account Number: [id number] Date of Birth/Sex: 12-07-1945 (74 y.o. F) Treating RN: Montey Hora Primary Care Souleymane Saiki: Lelon Huh Other Clinician: Referring Hudsyn Barich: Lelon Huh Treating Windi Toro/Extender: Melburn Hake, HOYT Weeks in Treatment: 1 Wound Status Wound Number: 17 Primary Venous Leg Ulcer Etiology: Wound Location: Left, Anterior Lower Leg Secondary Diabetic Wound/Ulcer of the Lower Extremity Wounding Event: Blister Etiology: Date Acquired: 03/31/2019 Wound Open Weeks Of Treatment: 1 Status: Clustered Wound: Yes Comorbid Lymphedema, Asthma, Congestive Heart Failure, History: Coronary Artery Disease, Hypertension, Type II Diabetes, Osteoarthritis Photos Wound Measurements Length: (cm) 14 Width: (cm) 20 Depth: (cm) 0.1 Clustered Quantity: 3 Area: (cm) 219.911 Volume: (cm) 21.991 % Reduction in Area: -438.5% % Reduction in Volume: -438.5% Epithelialization: None Tunneling: No Undermining: No Wound Description Classification: Full Thickness Without Exposed Support Structu Wound Margin: Flat and Intact  Exudate Amount: Large Exudate Type: Serous Exudate Color: amber res Foul Odor After Cleansing: No Slough/Fibrino Yes Wound Bed Granulation Amount: Large (67-100%) Exposed Structure Granulation Quality: Pink Fascia Exposed: No Necrotic Amount: Small (1-33%) Fat  Layer (Subcutaneous Tissue) Exposed: Yes Necrotic Quality: Adherent Slough Tendon Exposed: No Muscle Exposed: No Joint Exposed: No Bone Exposed: No Treatment Notes Wound #17 (Left, Anterior Lower Leg) Notes [name], Jillian B. ([id number]) silver cell, Xsorb, 3 layer bilateral Electronic Signature(s) Signed: 04/21/2019 12:02:39 PM By: Montey Hora Entered By: Montey Hora on 04/21/2019 09:42:17 [name] ([id number]) -------------------------------------------------------------------------------- Wound Assessment Details Patient Name: [name] B. Date of Service: 04/21/2019 9:30 AM Medical Record Number: [id number] Patient Account Number: [id number] Date of Birth/Sex: Mar 05, 1945 (74 y.o. F) Treating RN: Montey Hora Primary Care Kaliel Bolds: Lelon Huh Other Clinician: Referring Lyndee Herbst: Lelon Huh Treating Maxamilian Amadon/Extender: Melburn Hake, HOYT Weeks in Treatment: 1 Wound Status Wound Number: 18 Primary Etiology: Venous Leg Ulcer Wound Location: Left, Lateral Lower Leg Secondary Etiology: Diabetic Wound/Ulcer of the Lower Extremity Wounding Event: Blister Wound Status: Converted Date Acquired: 03/31/2019 Weeks Of Treatment: 1 Clustered Wound: Yes Wound Measurements Length: (cm) Width: (cm) Depth: (cm) Area: (cm) Volume: (cm) 0 % Reduction in Area: 100% 0 % Reduction in Volume: 100% 0 0 0 Wound Description Classification: Full Thickness Without Exposed Support Structure s Electronic Signature(s) Signed: 04/21/2019 12:02:39 PM By: Montey Hora Entered By: Montey Hora on 04/21/2019 09:26:11

## 2019-04-23 ENCOUNTER — Other Ambulatory Visit: Payer: Self-pay

## 2019-04-23 ENCOUNTER — Ambulatory Visit (INDEPENDENT_AMBULATORY_CARE_PROVIDER_SITE_OTHER): Payer: Medicare Other | Admitting: Internal Medicine

## 2019-04-23 VITALS — BP 110/60 | HR 60 | Ht 64.0 in | Wt 206.0 lb

## 2019-04-23 DIAGNOSIS — N183 Chronic kidney disease, stage 3 unspecified: Secondary | ICD-10-CM

## 2019-04-23 DIAGNOSIS — N189 Chronic kidney disease, unspecified: Secondary | ICD-10-CM

## 2019-04-23 DIAGNOSIS — N179 Acute kidney failure, unspecified: Secondary | ICD-10-CM

## 2019-04-23 DIAGNOSIS — I5022 Chronic systolic (congestive) heart failure: Secondary | ICD-10-CM

## 2019-04-23 DIAGNOSIS — E1122 Type 2 diabetes mellitus with diabetic chronic kidney disease: Secondary | ICD-10-CM

## 2019-04-23 DIAGNOSIS — I350 Nonrheumatic aortic (valve) stenosis: Secondary | ICD-10-CM | POA: Diagnosis not present

## 2019-04-23 MED ORDER — FUROSEMIDE 40 MG PO TABS: 40.0000 mg | ORAL_TABLET | ORAL | 1 refills | Status: DC

## 2019-04-23 NOTE — Patient Instructions (Signed)
Medication Instructions:  Your physician has recommended you make the following change in your medication:  1- CHANGE Furosemide to 40 mg by mouth every other day.  *If you need a refill on your cardiac medications before your next appointment, please call your pharmacy*   Follow-Up: At [facility], you and your health needs are our priority.  As part of our continuing mission to provide you with exceptional heart care, we have created designated Provider Care Teams.  These Care Teams include your primary Cardiologist (physician) and Advanced Practice Providers (APPs -  Physician Assistants and Nurse Practitioners) who all work together to provide you with the care you need, when you need it.  We recommend signing up for the patient portal called "MyChart".  Sign up information is provided on this After Visit Summary.  MyChart is used to connect with patients for Virtual Visits (Telemedicine).  Patients are able to view lab/test results, encounter notes, upcoming appointments, etc.  Non-urgent messages can be sent to your provider as well.   To learn more about what you can do with MyChart, go to [email or web address].    Your next appointment:   1 month(s)  The format for your next appointment:   In Person  Provider:    You may see one of the following Advanced Practice Providers on your designated Care Team:    Murray Hodgkins, NP  Christell Faith, PA-C  Marrianne Mood, PA-C

## 2019-04-23 NOTE — Progress Notes (Signed)
Follow-up Outpatient Visit Date: 04/23/2019  Primary Care Provider: Birdie Sons, MD [address] [place] [address]  Chief Complaint: Follow-up leg swelling  HPI:  [name] is a 74 y.o. female with history of non-obstructive CAD, NICM with chronic systolic and diastolic heart failure, HTN, DM, and multiple myeloma, who presents for follow-up of HFpEF.  I last saw [name] 2 weeks ago, at which time she reported progressive leg swelling as well as development of new leg wounds.  She also had slight increase in exertional dyspnea.  Preceding labs obtained by Dr. Caryn Section were notable for worsening renal function.  Creatinine was not significantly changed on recheck.  We agreed to stop amlodipine, spironolactone, and Metformin.  [name] was also referred to the wound clinic.  Recent echo showed preserved LVEF with stable mild aortic stenosis.  Today, [name] reports feeling relatively well with some improvement in her leg edema.  She is using "triple wraps" applied by the wound care clinic.  She denies shortness of breath, chest pain, palpitations, and lightheadedness.  She continues to sleep ina recliner, which has been longstanding.  She is urinating a lot on furosemide 40 mg daily.  --------------------------------------------------------------------------------------------------  Cardiovascular History & Procedures: Cardiovascular Problems:  Chronic systolic and diastolic heart failure secondary to non-ischemic cardiomyopathy  Nonobstructive coronary artery disease  Aortic stenosis  Risk Factors:  Known coronary artery disease, hypertension, hyperlipidemia, diabetes mellitus, sedentary lifestyle, obesity, and age > 48  Cath/PCI:  LHC/RHC (03/07/16): LMCA normal. LAD with 30% ostial stenosi, 30% midvessel narrowing, and 50% distal disease. LCx with 40% midvessel lesion. RCA with 30% proximal narrowing. LVEDP 26 mmHg. RA 18 (prominent "M" appearance), RV  82/21, PA mean 55, PCWP 25. Ao sat 96%, PA sat 54%, Fick Co/Ci 3.2/1.7. PVR 9.4 Wood units. No significant AoV gradient.  CV Surgery:  None  EP Procedures and Devices:  None  Non-Invasive Evaluation(s):  ABI's (07/26/16):Right:ABI notobtained, TBI 1.8. Left:ABI 1.9, TBI 1.0.Triphasic waveforms noted in both lower extremities.  TTE (07/26/16): Normal LV size with moderate LVH. LVEF 55-60% with normal wall motion. Grade 1 diastolic dysfunction. Mild aortic stenosis (mean gradient 11 mmHg). Mitral annular calcification with mild to moderate MR. Mild left atrial enlargement. Normal RV size and function. Normal PA pressure.  TTE (02/25/16): Normal LV size with moderate LVH. LVEF 25-30% with grade 2 diastolic dysfunction. Thickened aortic valve with likely moderate stenosis. MAC with mild MR. Mild LA enlargement. Mildly to moderately reduced RV contraction. Mild-moderate TR. Moderately PH (RVSP 55 mmHg).  Recent CV Pertinent Labs: Lab Results  Component Value Date   CHOL 151 09/27/2018   HDL 48 09/27/2018   LDLCALC 84 09/27/2018   TRIG 105 09/27/2018   CHOLHDL 3.1 09/27/2018   INR 1.0 03/02/2016   BNP 1,592.0 (H) 02/09/2016   K 4.1 04/18/2019   BUN 27 (H) 04/18/2019   BUN 40 (H) 04/10/2019   CREATININE 1.86 (H) 04/18/2019   CREATININE 1.31 (H) 12/21/2016    Past medical and surgical history were reviewed and updated in EPIC.  Current Meds  Medication Sig  . aspirin EC 81 MG tablet Take 1 tablet (81 mg total) by mouth daily.  . Cholecalciferol (VITAMIN D) 50 MCG (2000 UT) tablet Take 2,000 Units by mouth daily.  . cyanocobalamin 500 MCG tablet Take 250 mcg by mouth daily.   . furosemide (LASIX) 40 MG tablet Take 1 tablet (40 mg total) by mouth every other day.  Marland Kitchen HYDROcodone-acetaminophen (NORCO) 10-325  MG tablet Take 1 tablet by mouth every 8 (eight) hours as needed.  . lovastatin (MEVACOR) 20 MG tablet TAKE 1 TABLET BY MOUTH EVERYDAY AT BEDTIME  . metoprolol tartrate  (LOPRESSOR) 50 MG tablet Take 1 tablet (50 mg total) by mouth 2 (two) times daily.  Marland Kitchen oxybutynin (DITROPAN) 5 MG tablet Take 1 tablet (5 mg total) by mouth 2 (two) times daily.  Marland Kitchen tolterodine (DETROL) 2 MG tablet Take 1 tablet (2 mg total) by mouth 2 (two) times daily.  . traZODone (DESYREL) 100 MG tablet TAKE 1 TABLET BY MOUTH EVERY NIGHT AT BEDTIME AS NEEDED FOR INSOMNIA    Allergies: Hydrochlorothiazide and Penicillins  Social History   Tobacco Use  . Smoking status: Never Smoker  . Smokeless tobacco: Never Used  Substance Use Topics  . Alcohol use: No  . Drug use: No    Family History  Problem Relation Age of Onset  . Cancer Mother        Uterine cancer  . Diabetes Mother   . Heart disease Mother   . Heart failure Mother   . Parkinson's disease Father   . Cancer Sister        breast cancer  . Diabetes Brother        Non-insulin Dependent Diabetes Mellitus  . Mesothelioma Brother   . Hypertension Sister   . Diabetes Sister        Non-insulin dependent Diabetes Mellitus    Review of Systems: A 12-system review of systems was performed and was negative except as noted in the HPI.  --------------------------------------------------------------------------------------------------  Physical Exam: BP 110/60 (BP Location: Right Arm, Patient Position: Sitting, Cuff Size: Normal)   Pulse 60   Ht _0  (1.626 m)   Wt 206 lb (93.4 kg)   SpO2 96%   BMI 35.36 kg/m   General:  NAD. HEENT: No conjunctival pallor or scleral icterus. Facemask in place. Neck: No JVD or HJR. Lungs: Normal work of breathing. Clear to auscultation bilaterally without wheezes or crackles. Heart: Regular rate and rhythm with 2/6 systolic murmur. Abd: Bowel sounds present. Soft, NT/ND. Ext: 1+ bilateral calf edema with compression wraps in place..  EKG:  Normal sinus rhythm without abnormality.  Lab Results  Component Value Date   WBC 6.9 03/28/2019   HGB 9.0 (L) 03/28/2019   HCT 28.4 (L)  03/28/2019   MCV 91 03/28/2019   PLT 301 03/28/2019    Lab Results  Component Value Date   NA 137 04/18/2019   K 4.1 04/18/2019   CL 104 04/18/2019   CO2 27 04/18/2019   BUN 27 (H) 04/18/2019   CREATININE 1.86 (H) 04/18/2019   GLUCOSE 219 (H) 04/18/2019   ALT 6 03/28/2019    Lab Results  Component Value Date   CHOL 151 09/27/2018   HDL 48 09/27/2018   LDLCALC 84 09/27/2018   TRIG 105 09/27/2018   CHOLHDL 3.1 09/27/2018    --------------------------------------------------------------------------------------------------  ASSESSMENT AND PLAN: Chronic heart failure with recovered ejection fraction: [name] reports less edema; exam is limited by body habitus and compression wraps on both legs.  Her has not changed significantly.  She is concerned about urinary frequency with furosemide 40 mg daily.  I have asked her to take furosemide 40 mg every other day and to monitor her weight and edema carefully.  We will have her come back in ~1 month, with repeat BMP at that time.  We will continue to hold amlodipine, spironolactone, and losartan given recent acute  on chronic renal failure.  EF remains normal on echo last week.  Continue f/u with wound care clinic for BLE edema and wounds.  Acute on chronic renal failure: Creatinine modestly improved on recheck last week.  We will deescalate furosemide as above.  Potentially nephrotoxic agents should be held.  I advised Ms. Duell to avoid all NSAIDs.  Aortic stenosis: Mild by echo.  Continue clinical follow-up.  Type 2 diabetes mellitus: Metformin on hold in the setting of renal insufficiency (GFR < 30).  I will reach out to Dr. Caryn Section regarding ongoing management, as Ms. Floren may require something other than metformin.  Follow-up: Return to clinic in 1 month.  Nelva Bush, MD 04/24/2019 10:58 AM

## 2019-04-24 ENCOUNTER — Encounter: Payer: Self-pay | Admitting: Internal Medicine

## 2019-04-28 ENCOUNTER — Other Ambulatory Visit: Payer: Self-pay

## 2019-04-28 ENCOUNTER — Encounter: Payer: Medicare Other | Admitting: Physician Assistant

## 2019-04-28 DIAGNOSIS — Z7984 Long term (current) use of oral hypoglycemic drugs: Secondary | ICD-10-CM | POA: Diagnosis not present

## 2019-04-28 DIAGNOSIS — Z993 Dependence on wheelchair: Secondary | ICD-10-CM | POA: Diagnosis not present

## 2019-04-28 DIAGNOSIS — L97212 Non-pressure chronic ulcer of right calf with fat layer exposed: Secondary | ICD-10-CM | POA: Diagnosis not present

## 2019-04-28 DIAGNOSIS — I890 Lymphedema, not elsewhere classified: Secondary | ICD-10-CM | POA: Diagnosis not present

## 2019-04-28 DIAGNOSIS — Z880 Allergy status to penicillin: Secondary | ICD-10-CM | POA: Diagnosis not present

## 2019-04-28 DIAGNOSIS — Z888 Allergy status to other drugs, medicaments and biological substances status: Secondary | ICD-10-CM | POA: Diagnosis not present

## 2019-04-28 DIAGNOSIS — E11622 Type 2 diabetes mellitus with other skin ulcer: Secondary | ICD-10-CM | POA: Diagnosis not present

## 2019-04-28 DIAGNOSIS — L97222 Non-pressure chronic ulcer of left calf with fat layer exposed: Secondary | ICD-10-CM | POA: Diagnosis not present

## 2019-04-28 NOTE — Progress Notes (Addendum)
SHAWNDA, MAUNEY ([id number]) Visit Report for 04/28/2019 Chief Complaint Document Details Patient Name: [name], [name]. Date of Service: 04/28/2019 12:45 PM Medical Record Number: [id number] Patient Account Number: [id number] Date of Birth/Sex: 1945/09/18 (74 y.o. F) Treating RN: Army Melia Primary Care Provider: Lelon Huh Other Clinician: Referring Provider: Lelon Huh Treating Provider/Extender: Melburn Hake, Yuleni Burich Weeks in Treatment: 2 Information Obtained from: Patient Chief Complaint Patient seen for complaints of Non-Healing Wounds to both lower extremities Electronic Signature(s) Signed: 04/28/2019 1:09:59 PM By: Worthy Keeler PA-C Entered By: Worthy Keeler on 04/28/2019 13:09:58 [name] ([id number]) -------------------------------------------------------------------------------- HPI Details Patient Name: [name]. Date of Service: 04/28/2019 12:45 PM Medical Record Number: [id number] Patient Account Number: [id number] Date of Birth/Sex: 1945/10/27 (74 y.o. F) Treating RN: Army Melia Primary Care Provider: Lelon Huh Other Clinician: Referring Provider: Lelon Huh Treating Provider/Extender: Melburn Hake, Itzayanna Kaster Weeks in Treatment: 2 History of Present Illness Location: both lower extremity swelling with ulceration HPI Description: 74 year old patient who sees her PCP Dr. Lelon Huh was recently evaluated 10 days ago for diabetes mellitus, hypertension, CHF and hyperlipidemia. she also was noted to have ulcerations develop in her legs and she has been applying Silvadene dressings locally. In the past she has refused wound care referrals.her cardiologist Dr. Saunders Revel saw her and put her on 40 mg of furosemide daily. last hemoglobin A1c was 7.7%. she was also placed on ciprofloxacin twice daily for 7 days and a urine culture was recommended. past medical history significant for coronary artery disease, diabetes mellitus, nonischemic cardiomyopathy and  pulmonary hypertension. She is also status post heart catheterization and coronary angiography, tubal ligation and breast cyst removal in the past. She has never been a smoker. Patient had arterial studies done which showed bilateral ABIs are artificially elevated due to noncompressible and calcified vessels. Triphasic waveform throughout. Right great toe TBI is elevated while the left is normal. 10/23/2016 -- the patient is rather moribund from several issues including chronic back pain and knee pain and swelling of her legs. The large necrotic area on her left lateral anterior calf was bleeding on touch after washing her leg. There was a spot which needed silver nitrate cauterization and this was done appropriately. 11/06/2016 -- she is again noted to have friable bleeding from the left lower extremity wounds and this again had to be cauterized with silver nitrate to control the bleeding as pressure itself would not do it. 11/20/2016 -- the right lower extremity is completely healed and we have ordered 30-40 mm compression stocking's in both the dural layer and also a pair of juxta lites. 12/18/2016 -- she has not been wearing her compression stockings on her right leg and these have opened out into ulcerations again. Her left lower extremity has circumferential ulcerations now. I believe at this stage I would like to get her venous reflux studies done to make certain that there is no fixable superficial venous reflux. 12/29/2016 -- she has made an excellent recovery having continued with her appropriate doses of diuretics and elevation and exercise. She has not yet received her right lower extremity juxta lites. Her venous duplex study is at the end of January 01/13/16 on evaluation today patient's wounds appeared to be overall doing much better much less hyper granular than previous week's evaluation. The Hydrofera Blue Dressing's to be doing very well. She does tell me that she is having a  little bit more discomfort in the posterior aspect of her leg where she has a wound  at this point. Fortunately there appears to be no infection. 01/19/17 on evaluation today patient appears to be doing very well in regard to her bilateral lower extremity swelling and at this point in time her left lower extremity ulcers. Her wounds appear to be doing much better. She has been tolerating the dressing's at this point fortunately she did get the Juxta-Lite compression as of today as well. Nonetheless I am pleased that she has been tolerating everything so well and that her wounds looks so good. In fact she is an excellent granular surface no evidence of slough covering and I do not see any reason for likely debridement today. Especially in regard to the posterior leg. 01/26/17 on evaluation today patient appears to be doing decently well in regard to her wounds. The surface of the majority of her wounds is greatly improved. She has been using the Juxta-Lite compression which is excellent. Nonetheless the wound of the left posterior calf did require some debridement today otherwise the majority of the wounds did not require any debridement. The this was due to slough buildup on the surface. She also has a small skin flap where two of the ulcers actually connected and this has loosened up I'm concerned that things may not heal well with that flap but for the time being I'm not gonna do anything different in that regard. 02/23/17 on evaluation today patient's wound actually appears to be doing fairly well in regard to the remaining left lateral ulcer which we have been treated. With that being said it does appear that her wrap actually slid down the wood bit over the past few days at least that there's actually some friction injury and blistering noted on the medial aspect of her malleolus of the left lower extremity. She continues to use the Juxta- Lite for the right lower extremity. With that being said no  debridement is necessary today and these are very superficial injuries as far as the new injuries are concerned. 03/02/17 on evaluation today patient appears to be doing excellent in regard to her left lateral lower from the ulcers. In fact it appears that she is almost completely healed in regard to the ulcer we have been following and the new injuries which were caused by the wraps slipping down last week have been completely resolved which is excellent news. Overall I'm definitely pleased with the progress that she has made. Fortunately the rat did not cause any irritation as it did previously with the Unna wrap. 03/09/17 on evaluation today patient's wound appears to be completely healed. Obviously this is great news. She and her husband both are extremely pleased and excited to finally have this gone. Obviously she has been dealing with this wound for quite some time. Readmission: 04/14/2019 upon evaluation today patient appears for reevaluation here in our clinic although it has been since March 2019 since I last saw her. She unfortunately is having issues similar to what she had previous which are blisters and ulcerations of the bilateral lower extremities. Fortunately there is no evidence of active infection at this time based on what I am seeing. Unfortunately she is still having a lot of drainage and weeping. I do believe she needs to be compression wrap in order to get this under control and she is previously tolerated a 3 layer compression wrap without any complications whatsoever. No fevers, chills, nausea, vomiting, or diarrhea. [name], [name] ([id number]) 04/28/2019 upon evaluation today patient appears to be doing well with regard to the  bilateral lower extremities at this time. She has been tolerating the dressing changes without complication. Fortunately there is no signs of active infection at this time. No fevers, chills, nausea, vomiting, or diarrhea. Electronic Signature(s) Signed:  04/28/2019 1:24:07 PM By: Worthy Keeler PA-C Entered By: Worthy Keeler on 04/28/2019 13:24:06 [name] ([id number]) -------------------------------------------------------------------------------- Physical Exam Details Patient Name: [name], Jillian B. Date of Service: 04/28/2019 12:45 PM Medical Record Number: [id number] Patient Account Number: [id number] Date of Birth/Sex: [birth year] (74 y.o. F) Treating RN: Army Melia Primary Care Provider: Lelon Huh Other Clinician: Referring Provider: Lelon Huh Treating Provider/Extender: Melburn Hake, Morton Simson Weeks in Treatment: 2 Constitutional Well-nourished and well-hydrated in no acute distress. Respiratory normal breathing without difficulty. Psychiatric this patient is able to make decisions and demonstrates good insight into disease process. Alert and Oriented x 3. pleasant and cooperative. Notes Upon inspection patient's wounds actually appear to be showing signs of new epithelial growth which is great news and overall I do see signs of improvement here which is excellent. I am not seeing any evidence of active infection which is also good news. Electronic Signature(s) Signed: 04/28/2019 1:29:46 PM By: Worthy Keeler PA-C Entered By: Worthy Keeler on 04/28/2019 13:29:45 [name] ([id number]) -------------------------------------------------------------------------------- Physician Orders Details Patient Name: JAZIYAH, GRADEL B. Date of Service: 04/28/2019 12:45 PM Medical Record Number: [id number] Patient Account Number: [id number] Date of Birth/Sex: 01/30/1945 (74 y.o. 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 - Do not get wraps wet Wound #17 Left,Anterior Lower Leg o Dial antibacterial soap, wash wounds, rinse and pat dry prior to dressing wounds - HH nurse and in office o May shower with protection. - Do not get wraps wet Primary Wound Dressing Wound #14 Right,Anterior Lower Leg o Silver Alginate Wound #17 Left,Anterior Lower Leg o Silver Alginate Secondary Dressing Wound #14 Right,Anterior Lower Leg o XtraSorb Wound #17 Left,Anterior Lower Leg o XtraSorb Dressing Change Frequency Wound #14 Right,Anterior Lower Leg o Change dressing every week Wound #17 Left,Anterior Lower Leg o Change dressing every week Follow-up Appointments Wound #14 Right,Anterior Lower Leg o Return Appointment in 1 week. o Nurse Visit as needed Wound #17 Left,Anterior Lower Leg o Return Appointment in 1 week. o Nurse Visit as needed Edema Control Wound #14 Right,Anterior Lower Leg o 3 Layer Compression System - Bilateral Wound #17 Left,Anterior Lower Leg o 3 Layer Compression System - Bilateral [name], [name] ([id number]) Electronic Signature(s) Signed: 04/28/2019 4:20:49 PM By: Army Melia Signed: 04/29/2019 6:46:24 PM By: Worthy Keeler PA-C Entered By: Army Melia on 04/28/2019 13:14:32 [name] ([id number]) -------------------------------------------------------------------------------- Problem List Details Patient Name: PEREL, HAUSCHILD B. Date of Service: 04/28/2019 12:45 PM Medical Record Number: [id number] Patient Account Number: [id number] Date of Birth/Sex: 1945/05/02 (74 y.o. 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 Date of Service: 04/28/2019 12:45 PM Medical Record Number: [id number] Patient Account Number: [id number] Date of Birth/Sex: March 10, 1945 (74 y.o. F) Treating RN: Army Melia Primary Care Provider: Lelon Huh Other Clinician: Referring Provider: Lelon Huh Treating Provider/Extender: Melburn Hake, Renesmay Nesbitt Weeks in Treatment: 2 Subjective Chief Complaint Information obtained from Patient Patient seen for complaints of Non-Healing Wounds to both lower extremities History of Present Illness (HPI) The following HPI elements were documented for the patient's wound: Location: both lower extremity swelling with ulceration 74 year old patient who sees her PCP Dr. Lelon Huh was recently evaluated 10 days ago for diabetes mellitus, hypertension, CHF and hyperlipidemia. she also was noted to have ulcerations develop in her legs and she has been applying Silvadene dressings locally. In the past she has refused wound care referrals.her cardiologist Dr. Saunders Revel saw her and put her on 40 mg of furosemide daily. last hemoglobin A1c was 7.7%. she was also  placed on ciprofloxacin twice daily for 7 days and a urine culture was recommended. past medical history significant for coronary artery disease, diabetes mellitus, nonischemic cardiomyopathy and pulmonary hypertension. She is also status post heart catheterization and coronary angiography, tubal ligation and breast cyst removal in the past. She has never been a smoker. Patient had arterial studies done which showed bilateral ABIs are artificially elevated due to noncompressible and calcified vessels. Triphasic waveform throughout. Right great toe TBI is elevated while the left is normal. 10/23/2016 -- the patient is rather moribund from several issues including chronic back pain and knee pain and swelling of her legs. The large necrotic area on her left lateral anterior calf was bleeding on touch after washing her leg. There was a spot which needed silver nitrate cauterization and this was done appropriately. 11/06/2016 -- she is again noted to have friable bleeding from the left lower extremity wounds and this again had to be cauterized with silver nitrate to control the bleeding as pressure itself would not do it. 11/20/2016 -- the right lower extremity is completely healed and we have ordered 30-40 mm compression stocking's in both the dural layer and also a pair of juxta lites. 12/18/2016 -- she has not been wearing her compression stockings on her right leg and these have opened out into ulcerations again. Her left lower extremity has circumferential ulcerations now. I believe at this stage I would like to get her venous reflux studies done to make certain that there is no fixable superficial venous reflux. 12/29/2016 -- she has made an excellent recovery having continued with her appropriate doses of diuretics and elevation and exercise. She has not yet received her right lower extremity juxta lites. Her venous duplex study is at the end of January 01/13/16 on evaluation today patient's wounds  appeared to be overall doing much better much less hyper granular than previous week's evaluation. The Hydrofera Blue Dressing's to be doing very well. She does tell me that she is having a little bit more discomfort in the posterior aspect  of her leg where she has a wound at this point. Fortunately there appears to be no infection. 01/19/17 on evaluation today patient appears to be doing very well in regard to her bilateral lower extremity swelling and at this point in time her left lower extremity ulcers. Her wounds appear to be doing much better. She has been tolerating the dressing's at this point fortunately she did get the Juxta-Lite compression as of today as well. Nonetheless I am pleased that she has been tolerating everything so well and that her wounds looks so good. In fact she is an excellent granular surface no evidence of slough covering and I do not see any reason for likely debridement today. Especially in regard to the posterior leg. 01/26/17 on evaluation today patient appears to be doing decently well in regard to her wounds. The surface of the majority of her wounds is greatly improved. She has been using the Juxta-Lite compression which is excellent. Nonetheless the wound of the left posterior calf did require some debridement today otherwise the majority of the wounds did not require any debridement. The this was due to slough buildup on the surface. She also has a small skin flap where two of the ulcers actually connected and this has loosened up I'm concerned that things may not heal well with that flap but for the time being I'm not gonna do anything different in that regard. 02/23/17 on evaluation today patient's wound actually appears to be doing fairly well in regard to the remaining left lateral ulcer which we have been treated. With that being said it does appear that her wrap actually slid down the wood bit over the past few days at least that there's actually some  friction injury and blistering noted on the medial aspect of her malleolus of the left lower extremity. She continues to use the Juxta- Lite for the right lower extremity. With that being said no debridement is necessary today and these are very superficial injuries as far as the new injuries are concerned. 03/02/17 on evaluation today patient appears to be doing excellent in regard to her left lateral lower from the ulcers. In fact it appears that she is almost completely healed in regard to the ulcer we have been following and the new injuries which were caused by the wraps slipping down last week have been completely resolved which is excellent news. Overall I'm definitely pleased with the progress that she has made. Fortunately the rat did not cause any irritation as it did previously with the Unna wrap. 03/09/17 on evaluation today patient's wound appears to be completely healed. Obviously this is great news. She and her husband both are extremely pleased and excited to finally have this gone. Obviously she has been dealing with this wound for quite some time. Readmission: [name], [name] ([id number]) 04/14/2019 upon evaluation today patient appears for reevaluation here in our clinic although it has been since March 2019 since I last saw her. She unfortunately is having issues similar to what she had previous which are blisters and ulcerations of the bilateral lower extremities. Fortunately there is no evidence of active infection at this time based on what I am seeing. Unfortunately she is still having a lot of drainage and weeping. I do believe she needs to be compression wrap in order to get this under control and she is previously tolerated a 3 layer compression wrap without any complications whatsoever. No fevers, chills, nausea, vomiting, or diarrhea. 04/28/2019 upon evaluation today patient appears  to be doing well with regard to the bilateral lower extremities at this time. She has  been tolerating the dressing changes without complication. Fortunately there is no signs of active infection at this time. No fevers, chills, nausea, vomiting, or diarrhea. Objective Constitutional Well-nourished and well-hydrated in no acute distress. Vitals Time Taken: 12:50 PM, Height: 64 in, Weight: 206 lbs, BMI: 35.4, Temperature: 98.3 F, Pulse: 72 bpm, Respiratory Rate: 16 breaths/min, Blood Pressure: 122/58 mmHg. Respiratory normal breathing without difficulty. Psychiatric this patient is able to make decisions and demonstrates good insight into disease process. Alert and Oriented x 3. pleasant and cooperative. General Notes: Upon inspection patient's wounds actually appear to be showing signs of new epithelial growth which is great news and overall I do see signs of improvement here which is excellent. I am not seeing any evidence of active infection which is also good news. Integumentary (Hair, Skin) Wound #14 status is Open. Original cause of wound was Blister. The wound is located on the Right,Anterior Lower Leg. The wound measures 4cm length x 13cm width x 0.1cm depth; 40.841cm^2 area and 4.084cm^3 volume. There is Fat Layer (Subcutaneous Tissue) Exposed exposed. There is no tunneling or undermining noted. There is a large amount of serous drainage noted. The wound margin is flat and intact. There is large (67-100%) pink granulation within the wound bed. There is a small (1-33%) amount of necrotic tissue within the wound bed including Adherent Slough. Wound #17 status is Open. Original cause of wound was Blister. The wound is located on the Left,Anterior Lower Leg. The wound measures 8cm length x 21cm width x 0.1cm depth; 131.947cm^2 area and 13.195cm^3 volume. There is Fat Layer (Subcutaneous Tissue) Exposed exposed. There is no tunneling or undermining noted. There is a large amount of serous drainage noted. The wound margin is flat and intact. There is large (67-100%) pink  granulation within the wound bed. There is a small (1-33%) amount of necrotic tissue within the wound bed including Adherent Slough. 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 There was a Three Layer Compression Therapy Procedure by Army Melia, RN. Post procedure Diagnosis Wound #14: Same as Pre-Procedure ANAB, VIVAR ([id number]) Plan Wound Cleansing: Wound #14 Right,Anterior Lower Leg: Dial antibacterial soap, wash wounds, rinse and pat dry prior to dressing wounds - HH nurse and in office May shower with protection. - Do not get wraps wet Wound #17 Left,Anterior Lower Leg: Dial antibacterial soap, wash wounds, rinse and pat dry prior to dressing wounds - HH nurse and in office May shower with protection. - Do not get wraps wet Primary Wound Dressing: Wound #14 Right,Anterior Lower Leg: Silver Alginate Wound #17 Left,Anterior Lower Leg: Silver Alginate Secondary Dressing: Wound #14 Right,Anterior Lower Leg: XtraSorb Wound #17 Left,Anterior Lower Leg: XtraSorb Dressing Change Frequency: Wound #14 Right,Anterior Lower Leg: Change dressing every week Wound #17 Left,Anterior Lower Leg: Change dressing every week Follow-up Appointments: Wound #14 Right,Anterior Lower Leg: Return Appointment in 1 week. Nurse Visit as needed Wound #17 Left,Anterior Lower Leg: Return Appointment in 1 week. Nurse Visit as needed Edema Control: Wound #14 Right,Anterior Lower Leg: 3 Layer Compression System - Bilateral Wound #17 Left,Anterior Lower Leg: 3 Layer Compression System - Bilateral 1.  My suggestion at this time is good to be that we go ahead and continue with  current wound care measures using XtraSorb with regard to the dressing of choice I think that still probably the best way to go at this point. 2. I am also can recommend currently that we go ahead and proceed with the continuation of 3 layer compression wrap it also seems to be doing well for her. 3. I do recommend the patient continue to elevate her legs is much as possible. Obviously more she can keep this under control the better. We will see patient back for reevaluation in 1 week here in the clinic. If anything worsens or changes patient will contact our office for additional recommendations. Electronic Signature(s) Signed: 04/28/2019 1:30:20 PM By: Worthy Keeler PA-C Entered By: Worthy Keeler on 04/28/2019 13:30:19 [name] ([id number]) -------------------------------------------------------------------------------- SuperBill Details Patient Name: [name] Date of Service: 04/28/2019 Medical Record Number: [id number] Patient Account Number: [id number] Date of Birth/Sex: 02-28-1945 (74 y.o. 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 Physician Procedures CPT4 Code: 8325498 Description: 26415 - WC PHYS LEVEL 3 - EST PT Modifier: Quantity: 1 CPT4 Code: Description: ICD-10 Diagnosis Description E11.622 Type 2 diabetes mellitus with other skin ulcer L97.212  Non-pressure chronic ulcer of right calf with fat layer exposed I89.0 Lymphedema, not elsewhere classified L97.222 Non-pressure chronic ulcer of left  calf with fat layer exposed Modifier: Quantity: Electronic Signature(s) Signed: 04/28/2019 1:30:32 PM By: Worthy Keeler PA-C Entered By: Worthy Keeler on 04/28/2019 13:30:31

## 2019-04-29 NOTE — Progress Notes (Signed)
MEL, TADROS ([id number]) Visit Report for 04/28/2019 Arrival Information Details Patient Name: [name], [name]. Date of Service: 04/28/2019 12:45 PM Medical Record [id number] Patient Account Number: [id number] Date of Birth/Sex: [birth year] (74 y.o. F) Treating RN: Army Melia Primary Care Jensine Luz: Lelon Huh Other Clinician: Referring Edgar Corrigan: Lelon Huh Treating Imanii Gosdin/Extender:STONE III, HOYT Weeks in Treatment: 2 Visit Information History Since Last Visit Added or deleted any medications: No Patient Arrived: Wheel Chair Any new allergies or adverse reactions: No Arrival Time: 12:52 Had a fall or experienced change in No Accompanied By: husband activities of daily living that may affect Transfer Assistance: None risk of falls: Patient Identification Verified: Yes Signs or symptoms of abuse/neglect since last visito No Secondary Verification Process Completed: Yes Hospitalized since last visit: No Implantable device outside of the clinic excluding No cellular tissue based products placed in the center since last visit: Has Dressing in Place as Prescribed: Yes Has Compression in Place as Prescribed: Yes Pain Present Now: No Electronic Signature(s) Signed: 04/28/2019 4:36:10 PM By: Lorine Bears RCP, RRT, CHT Entered By: Lorine Bears on 04/28/2019 12:53:32 [name] ([id number]) -------------------------------------------------------------------------------- Compression Therapy Details Patient Name: [name]. Date of Service: 04/28/2019 12:45 PM Medical Record [id number] Patient Account Number: [id number] Date of Birth/Sex: Nov 17, 1945 (74 y.o. F) Treating RN: Army Melia Primary Care Zaylee Cornia: Lelon Huh Other Clinician: Referring Serrita Lueth: Lelon Huh Treating Khadijah Mastrianni/Extender:STONE III, HOYT Weeks in Treatment: 2 Compression Therapy Performed for Wound Assessment: Wound #14 Right,Anterior Lower  Leg Performed By: Clinician Army Melia, RN Compression Type: Three Layer Post Procedure Diagnosis Same as Pre-procedure Electronic Signature(s) Signed: 04/28/2019 4:20:49 PM By: Army Melia Entered By: Army Melia on 04/28/2019 13:13:00 [name] ([id number]) -------------------------------------------------------------------------------- Encounter Discharge Information Details Patient Name: [name], [name]. Date of Service: 04/28/2019 12:45 PM Medical Record [id number] Patient Account Number: [id number] Date of Birth/Sex: 1945/02/12 (74 y.o. F) Treating RN: Army Melia Primary Care Terrion Poblano: Lelon Huh Other Clinician: Referring Sible Straley: Lelon Huh Treating Lamiah Marmol/Extender:STONE III, HOYT Weeks in Treatment: 2 Encounter Discharge Information Items Discharge Condition: Stable Ambulatory Status: Wheelchair Discharge Destination: Home Transportation: Private Auto Accompanied By: husband Schedule Follow-up Appointment: Yes Clinical Summary of Care: Electronic Signature(s) Signed: 04/28/2019 4:20:49 PM By: Army Melia Entered By: Army Melia on 04/28/2019 13:15:31 [name] ([id number]) -------------------------------------------------------------------------------- Lower Extremity Assessment Details Patient Name: [name], [name]. Date of Service: 04/28/2019 12:45 PM Medical Record [id number] Patient Account Number: [id number] Date of Birth/Sex: Jun 07, 1945 (74 y.o. F) Treating RN: Montey Hora Primary Care Nevaya Nagele: Lelon Huh Other Clinician: Referring Doniqua Saxby: Lelon Huh Treating Nnamdi Dacus/Extender:STONE III, HOYT Weeks in Treatment: 2 Edema Assessment Assessed: [Left: No] [Right: No] Edema: [Left: Yes] [Right: Yes] Calf Left: Right: Point of Measurement: 30 cm From Medial Instep 47 cm 44.5 cm Ankle Left: Right: Point of Measurement: 10 cm From Medial Instep 25 cm 23.5 cm Vascular Assessment Pulses: Dorsalis Pedis Palpable:  [Left:Yes] [Right:Yes] Electronic Signature(s) Signed: 04/28/2019 4:47:11 PM By: Montey Hora Entered By: Montey Hora on 04/28/2019 13:01:29 [name] ([id number]) -------------------------------------------------------------------------------- Multi Wound Chart Details Patient Name: [name]. Date of Service: 04/28/2019 12:45 PM Medical Record [id number] Patient Account Number: [id number] Date of Birth/Sex: 1945-07-11 (74 y.o. F) Treating RN: Army Melia Primary Care Onica Davidovich: Lelon Huh Other Clinician: Referring Ovie Cornelio: Lelon Huh Treating Syble Picco/Extender:STONE III, HOYT Weeks in Treatment: 2 Vital Signs Height(in): 64 Pulse(bpm): 72 Weight(lbs): 206 Blood Pressure(mmHg): 122/58 Body Mass Index(BMI): 35 Temperature(F): 98.3 Respiratory Rate(breaths/min): 16 Photos: [N/A:N/A] Wound Location: Right, Anterior Lower Leg Left,  Anterior Lower Leg N/A Wounding Event: Blister Blister N/A Primary Etiology: Venous Leg Ulcer Venous Leg Ulcer N/A Secondary Etiology: Diabetic Wound/Ulcer of the Lower Diabetic Wound/Ulcer of the Lower N/A Extremity Extremity Comorbid History: Lymphedema, Asthma, Congestive Lymphedema, Asthma, Congestive N/A Heart Failure, Coronary Artery Heart Failure, Coronary Artery Disease, Hypertension, Type II Disease, Hypertension, Type II Diabetes, Osteoarthritis Diabetes, Osteoarthritis Date Acquired: 03/31/2019 03/31/2019 N/A Weeks of Treatment: 2 2 N/A Wound Status: Open Open N/A Clustered Wound: Yes Yes N/A Clustered Quantity: 2 3 N/A Measurements L x W x D (cm) 4x13x0.1 8x21x0.1 N/A Area (cm) : 40.841 131.947 N/A Volume (cm) : 4.084 13.195 N/A % Reduction in Area: -123.70% -223.10% N/A % Reduction in Volume: -123.70% -223.10% N/A Classification: Full Thickness Without Exposed Full Thickness Without Exposed N/A Support Structures Support Structures Exudate Amount: Large Large N/A Exudate Type: Serous Serous N/A Exudate  Color: amber amber N/A Wound Margin: Flat and Intact Flat and Intact N/A Granulation Amount: Large (67-100%) Large (67-100%) N/A Granulation Quality: Pink Pink N/A Necrotic Amount: Small (1-33%) Small (1-33%) N/A Exposed Structures: Fat Layer (Subcutaneous Tissue) Fat Layer (Subcutaneous Tissue) N/A Exposed: Yes Exposed: Yes Fascia: No Fascia: No Tendon: No Tendon: No Muscle: No Muscle: No Joint: No Joint: No Bone: No Bone: No Epithelialization: Small (1-33%) None N/A Treatment Notes Electronic Signature(s) Signed: 04/28/2019 4:20:49 PM By: Army Melia Entered By: Army Melia on 04/28/2019 13:11:48 [name] ([id number]) -------------------------------------------------------------------------------- [place] Details Patient Name: [name], [name]. Date of Service: 04/28/2019 12:45 PM Medical Record [id number] Patient Account Number: [id number] Date of Birth/Sex: 01/08/1945 (74 y.o. F) Treating RN: Army Melia Primary Care Maycol Hoying: Lelon Huh Other Clinician: Referring Zyrion Coey: Lelon Huh Treating Carriann Hesse/Extender:STONE III, HOYT Weeks in Treatment: 2 Active Inactive Orientation to the Wound Care Program Nursing Diagnoses: Knowledge deficit related to the wound healing center program Goals: Patient/caregiver will verbalize understanding of the [facility] Date Initiated: 04/14/2019 Target Resolution Date: 05/09/2019 Goal Status: Active Interventions: Provide education on orientation to the wound center Notes: Venous Leg Ulcer Nursing Diagnoses: Potential for venous Insuffiency (use before diagnosis confirmed) Goals: Patient/caregiver will verbalize understanding of disease process and disease management Date Initiated: 04/14/2019 Target Resolution Date: 05/09/2019 Goal Status: Active Interventions: Assess peripheral edema status every visit. Notes: Wound/Skin Impairment Nursing Diagnoses: Impaired tissue  integrity Goals: Ulcer/skin breakdown will have a volume reduction of 30% by week 4 Date Initiated: 04/14/2019 Target Resolution Date: 05/09/2019 Goal Status: Active Interventions: Assess ulceration(s) every visit Notes: Electronic Signature(s) Signed: 04/28/2019 4:20:49 PM By: Army Melia Entered By: Army Melia on 04/28/2019 13:11:36 [name] ([id number]) -------------------------------------------------------------------------------- Pain Assessment Details Patient Name: [name]. Date of Service: 04/28/2019 12:45 PM Medical Record [id number] Patient Account Number: [id number] Date of Birth/Sex: 1945/01/07 (74 y.o. F) Treating RN: Montey Hora Primary Care Giannie Soliday: Lelon Huh Other Clinician: Referring Jalee Saine: Lelon Huh Treating Dailey Buccheri/Extender:STONE III, HOYT Weeks in Treatment: 2 Active Problems Location of Pain Severity and Description of Pain Patient Has Paino No Site Locations Pain Management and Medication Current Pain Management: Electronic Signature(s) Signed: 04/28/2019 4:47:11 PM By: Montey Hora Entered By: Montey Hora on 04/28/2019 12:59:39 [name] ([id number]) -------------------------------------------------------------------------------- Patient/Caregiver Education Details Patient Name: [name]. Date of Service: 04/28/2019 12:45 PM Medical Record [id number] Patient Account Number: [id number] Date of Birth/Gender: 1945-10-14 (74 y.o. F) Treating RN: Army Melia Primary Care Physician: Lelon Huh Other Clinician: Referring Physician: Lelon Huh Treating Physician/Extender:STONE III, HOYT Weeks in Treatment: 2 Education Assessment Education Provided To: Patient Education  Topics Provided Wound/Skin Impairment: Handouts: Caring for Your Ulcer Methods: Demonstration, Explain/Verbal Responses: State content correctly Electronic Signature(s) Signed: 04/28/2019 4:20:49 PM By: Army Melia Entered  By: Army Melia on 04/28/2019 13:14:53 [name] ([id number]) -------------------------------------------------------------------------------- Wound Assessment Details Patient Name: [name], [name]. Date of Service: 04/28/2019 12:45 PM Medical Record [id number] Patient Account Number: [id number] Date of Birth/Sex: Aug 23, 1945 (74 y.o. F) Treating RN: Montey Hora Primary Care Luciana Cammarata: Lelon Huh Other Clinician: Referring Chancellor Vanderloop: Lelon Huh Treating Alquan Morrish/Extender:STONE III, HOYT Weeks in Treatment: 2 Wound Status Wound Number: 14 Primary Venous Leg Ulcer Wound Location: Right, Anterior Lower Leg Etiology: Wounding Event: Blister Secondary Diabetic Wound/Ulcer of the Lower Extremity Date Acquired: 03/31/2019 Etiology: Weeks Of Treatment: 2 Wound Status:Open Clustered Wound: Yes Comorbid Lymphedema, Asthma, Congestive Heart Failure, Coronary History: Artery Disease, Hypertension, Type II Diabetes, Osteoarthritis Photos Wound Measurements Length: (cm) 4 Width: (cm) 13 Depth: (cm) 0.1 Clustered Quantity: 2 Area: (cm) 40.841 Volume: (cm) 4.084 % Reduction in Area: -123.7% % Reduction in Volume: -123.7% Epithelialization: Small (1-33%) Tunneling: No Undermining: No Wound Description Classification: Full Thickness Without Exposed Support Struc Wound Margin: Flat and Intact Exudate Amount: Large Exudate Type: Serous Exudate Color: amber tures Foul Odor After Cleansing: No Slough/Fibrino Yes Wound Bed Granulation Amount: Large (67-100%) Exposed Structure Granulation Quality: Pink Fascia Exposed: No Necrotic Amount: Small (1-33%) Fat Layer (Subcutaneous Tissue) Exposed: Yes Necrotic Quality: Adherent Slough Tendon Exposed: No Muscle Exposed: No Joint Exposed: No Bone Exposed: No Treatment Notes Wound #14 (Right, Anterior Lower Leg) Notes scell, xsorb, 3 layer bilateral Electronic Signature(s) Signed: 04/28/2019 4:47:11 PM By: Montey Hora Entered By: Montey Hora on 04/28/2019 13:05:36 [name] ([id number]) -------------------------------------------------------------------------------- Wound Assessment Details Patient Name: [name]. Date of Service: 04/28/2019 12:45 PM Medical Record [id number] Patient Account Number: [id number] Date of Birth/Sex: [birth year] (75 y.o. F) Treating RN: Montey Hora Primary Care Andreah Goheen: Lelon Huh Other Clinician: Referring Yonas Bunda: Lelon Huh Treating Jerritt Cardoza/Extender:STONE III, HOYT Weeks in Treatment: 2 Wound Status Wound Number: 17 Primary Venous Leg Ulcer Wound Location: Left, Anterior Lower Leg Etiology: Wounding Event: Blister Secondary Diabetic Wound/Ulcer of the Lower Extremity Date Acquired: 03/31/2019 Etiology: Weeks Of Treatment: 2 Wound Status:Open Clustered Wound: Yes Comorbid Lymphedema, Asthma, Congestive Heart Failure, Coronary History: Artery Disease, Hypertension, Type II Diabetes, Osteoarthritis Photos Wound Measurements Length: (cm) 8 Width: (cm) 21 Depth: (cm) 0.1 Clustered Quantity: 3 Area: (cm) 131.947 Volume: (cm) 13.195 % Reduction in Area: -223.1% % Reduction in Volume: -223.1% Epithelialization: None Tunneling: No Undermining: No Wound Description Classification: Full Thickness Without Exposed Support Struc Wound Margin: Flat and Intact Exudate Amount: Large Exudate Type: Serous Exudate Color: amber tures Foul Odor After Cleansing: No Slough/Fibrino Yes Wound Bed Granulation Amount: Large (67-100%) Exposed Structure Granulation Quality: Pink Fascia Exposed: No Necrotic Amount: Small (1-33%) Fat Layer (Subcutaneous Tissue) Exposed: Yes Necrotic Quality: Adherent Slough Tendon Exposed: No Muscle Exposed: No Joint Exposed: No Bone Exposed: No Treatment Notes Wound #17 (Left, Anterior Lower Leg) Notes scell, xsorb, 3 layer bilateral Electronic Signature(s) Signed: 04/28/2019 4:47:11 PM By: Montey Hora Entered By: Montey Hora on 04/28/2019 13:05:57 [name] ([id number]) -------------------------------------------------------------------------------- [place] Details Patient Name: [name]. Date of Service: 04/28/2019 12:45 PM Medical Record [id number] Patient Account Number: [id number] Date of Birth/Sex: 12-Apr-1945 (74 y.o. F) Treating RN: Army Melia Primary Care Lumir Demetriou: Lelon Huh Other Clinician: Referring Melvyn Hommes: Lelon Huh Treating Reade Trefz/Extender:STONE III, HOYT Weeks in Treatment: 2 Vital Signs Time Taken: 12:50 Temperature (F): 98.3 Height (in): 64 Pulse (bpm):  72 Weight (lbs): 206 Respiratory Rate (breaths/min): 16 Body Mass Index (BMI): 35.4 Blood Pressure (mmHg): 122/58 Reference Range: 80 - 120 mg / dl Electronic Signature(s) Signed: 04/28/2019 4:36:10 PM By: Lorine Bears RCP, RRT, CHT Entered By: Lorine Bears on 04/28/2019 12:54:15

## 2019-05-05 ENCOUNTER — Other Ambulatory Visit: Payer: Self-pay

## 2019-05-05 ENCOUNTER — Encounter: Payer: Medicare Other | Attending: Physician Assistant | Admitting: Physician Assistant

## 2019-05-05 DIAGNOSIS — I509 Heart failure, unspecified: Secondary | ICD-10-CM | POA: Diagnosis not present

## 2019-05-05 DIAGNOSIS — I272 Pulmonary hypertension, unspecified: Secondary | ICD-10-CM | POA: Insufficient documentation

## 2019-05-05 DIAGNOSIS — L97222 Non-pressure chronic ulcer of left calf with fat layer exposed: Secondary | ICD-10-CM | POA: Insufficient documentation

## 2019-05-05 DIAGNOSIS — Z880 Allergy status to penicillin: Secondary | ICD-10-CM | POA: Insufficient documentation

## 2019-05-05 DIAGNOSIS — I110 Hypertensive heart disease with heart failure: Secondary | ICD-10-CM | POA: Diagnosis not present

## 2019-05-05 DIAGNOSIS — L97212 Non-pressure chronic ulcer of right calf with fat layer exposed: Secondary | ICD-10-CM | POA: Insufficient documentation

## 2019-05-05 DIAGNOSIS — J45909 Unspecified asthma, uncomplicated: Secondary | ICD-10-CM | POA: Diagnosis not present

## 2019-05-05 DIAGNOSIS — I890 Lymphedema, not elsewhere classified: Secondary | ICD-10-CM | POA: Insufficient documentation

## 2019-05-05 DIAGNOSIS — I428 Other cardiomyopathies: Secondary | ICD-10-CM | POA: Insufficient documentation

## 2019-05-05 DIAGNOSIS — I2510 Atherosclerotic heart disease of native coronary artery without angina pectoris: Secondary | ICD-10-CM | POA: Insufficient documentation

## 2019-05-05 DIAGNOSIS — E11622 Type 2 diabetes mellitus with other skin ulcer: Secondary | ICD-10-CM | POA: Diagnosis not present

## 2019-05-05 DIAGNOSIS — M1990 Unspecified osteoarthritis, unspecified site: Secondary | ICD-10-CM | POA: Diagnosis not present

## 2019-05-05 DIAGNOSIS — E785 Hyperlipidemia, unspecified: Secondary | ICD-10-CM | POA: Insufficient documentation

## 2019-05-05 DIAGNOSIS — Z881 Allergy status to other antibiotic agents status: Secondary | ICD-10-CM | POA: Diagnosis not present

## 2019-05-05 NOTE — Progress Notes (Signed)
CHYLER, CREELY ([id number]) Visit Report for 05/05/2019 Chief Complaint Document Details Patient Name: [name], [name]. Date of Service: 05/05/2019 10:30 AM Medical Record Number: [id number] Patient Account Number: [id number] Date of Birth/Sex: [birth year] (74 y.o. F) Treating RN: Army Melia Primary Care Provider: Lelon Huh Other Clinician: Referring Provider: Lelon Huh Treating Provider/Extender: Melburn Hake, HOYT Weeks in Treatment: 3 Information Obtained from: Patient Chief Complaint Patient seen for complaints of Non-Healing Wounds to both lower extremities Electronic Signature(s) Signed: 05/05/2019 11:12:43 AM By: Worthy Keeler PA-C Entered By: Worthy Keeler on 05/05/2019 11:12:42 [name] ([id number]) -------------------------------------------------------------------------------- HPI Details Patient Name: [name]. Date of Service: 05/05/2019 10:30 AM Medical Record Number: [id number] Patient Account Number: [id number] Date of Birth/Sex: 09-30-1945 (74 y.o. F) Treating RN: Army Melia Primary Care Provider: Lelon Huh Other Clinician: Referring Provider: Lelon Huh Treating Provider/Extender: Melburn Hake, HOYT Weeks in Treatment: 3 History of Present Illness Location: both lower extremity swelling with ulceration HPI Description: 74 year old patient who sees her PCP Dr. Lelon Huh was recently evaluated 10 days ago for diabetes mellitus, hypertension, CHF and hyperlipidemia. she also was noted to have ulcerations develop in her legs and she has been applying Silvadene dressings locally. In the past she has refused wound care referrals.her cardiologist Dr. Saunders Revel saw her and put her on 40 mg of furosemide daily. last hemoglobin A1c was 7.7%. she was also placed on ciprofloxacin twice daily for 7 days and a urine culture was recommended. past medical history significant for coronary artery disease, diabetes mellitus, nonischemic cardiomyopathy and  pulmonary hypertension. She is also status post heart catheterization and coronary angiography, tubal ligation and breast cyst removal in the past. She has never been a smoker. Patient had arterial studies done which showed bilateral ABIs are artificially elevated due to noncompressible and calcified vessels. Triphasic waveform throughout. Right great toe TBI is elevated while the left is normal. 10/23/2016 -- the patient is rather moribund from several issues including chronic back pain and knee pain and swelling of her legs. The large necrotic area on her left lateral anterior calf was bleeding on touch after washing her leg. There was a spot which needed silver nitrate cauterization and this was done appropriately. 11/06/2016 -- she is again noted to have friable bleeding from the left lower extremity wounds and this again had to be cauterized with silver nitrate to control the bleeding as pressure itself would not do it. 11/20/2016 -- the right lower extremity is completely healed and we have ordered 30-40 mm compression stocking's in both the dural layer and also a pair of juxta lites. 12/18/2016 -- she has not been wearing her compression stockings on her right leg and these have opened out into ulcerations again. Her left lower extremity has circumferential ulcerations now. I believe at this stage I would like to get her venous reflux studies done to make certain that there is no fixable superficial venous reflux. 12/29/2016 -- she has made an excellent recovery having continued with her appropriate doses of diuretics and elevation and exercise. She has not yet received her right lower extremity juxta lites. Her venous duplex study is at the end of January 01/13/16 on evaluation today patient's wounds appeared to be overall doing much better much less hyper granular than previous week's evaluation. The Hydrofera Blue Dressing's to be doing very well. She does tell me that she is having a  little bit more discomfort in the posterior aspect of her leg where she has a wound  at this point. Fortunately there appears to be no infection. 01/19/17 on evaluation today patient appears to be doing very well in regard to her bilateral lower extremity swelling and at this point in time her left lower extremity ulcers. Her wounds appear to be doing much better. She has been tolerating the dressing's at this point fortunately she did get the Juxta-Lite compression as of today as well. Nonetheless I am pleased that she has been tolerating everything so well and that her wounds looks so good. In fact she is an excellent granular surface no evidence of slough covering and I do not see any reason for likely debridement today. Especially in regard to the posterior leg. 01/26/17 on evaluation today patient appears to be doing decently well in regard to her wounds. The surface of the majority of her wounds is greatly improved. She has been using the Juxta-Lite compression which is excellent. Nonetheless the wound of the left posterior calf did require some debridement today otherwise the majority of the wounds did not require any debridement. The this was due to slough buildup on the surface. She also has a small skin flap where two of the ulcers actually connected and this has loosened up I'm concerned that things may not heal well with that flap but for the time being I'm not gonna do anything different in that regard. 02/23/17 on evaluation today patient's wound actually appears to be doing fairly well in regard to the remaining left lateral ulcer which we have been treated. With that being said it does appear that her wrap actually slid down the wood bit over the past few days at least that there's actually some friction injury and blistering noted on the medial aspect of her malleolus of the left lower extremity. She continues to use the Juxta- Lite for the right lower extremity. With that being said no  debridement is necessary today and these are very superficial injuries as far as the new injuries are concerned. 03/02/17 on evaluation today patient appears to be doing excellent in regard to her left lateral lower from the ulcers. In fact it appears that she is almost completely healed in regard to the ulcer we have been following and the new injuries which were caused by the wraps slipping down last week have been completely resolved which is excellent news. Overall I'm definitely pleased with the progress that she has made. Fortunately the rat did not cause any irritation as it did previously with the Unna wrap. 03/09/17 on evaluation today patient's wound appears to be completely healed. Obviously this is great news. She and her husband both are extremely pleased and excited to finally have this gone. Obviously she has been dealing with this wound for quite some time. Readmission: 04/14/2019 upon evaluation today patient appears for reevaluation here in our clinic although it has been since March 2019 since I last saw her. She unfortunately is having issues similar to what she had previous which are blisters and ulcerations of the bilateral lower extremities. Fortunately there is no evidence of active infection at this time based on what I am seeing. Unfortunately she is still having a lot of drainage and weeping. I do believe she needs to be compression wrap in order to get this under control and she is previously tolerated a 3 layer compression wrap without any complications whatsoever. No fevers, chills, nausea, vomiting, or diarrhea. JERRIYAH, LOUIS ([id number]) 04/28/2019 upon evaluation today patient appears to be doing well with regard to the  bilateral lower extremities at this time. She has been tolerating the dressing changes without complication. Fortunately there is no signs of active infection at this time. No fevers, chills, nausea, vomiting, or diarrhea. 05/05/2019 upon evaluation today  patient appears to be doing excellent in regard to her legs. She has a lot of new skin growth and overall seems to be progressing quite nicely. Fortunately there is no signs of active infection at this time. No fevers, chills, nausea, vomiting, or diarrhea. Electronic Signature(s) Signed: 05/05/2019 1:31:57 PM By: Worthy Keeler PA-C Entered By: Worthy Keeler on 05/05/2019 13:31:56 [name] ([id number]) -------------------------------------------------------------------------------- Physical Exam Details Patient Name: TEIA, FREITAS B. Date of Service: 05/05/2019 10:30 AM Medical Record Number: [id number] Patient Account Number: [id number] Date of Birth/Sex: 06/24/1945 (74 y.o. F) Treating RN: Army Melia Primary Care Provider: Lelon Huh Other Clinician: Referring Provider: Lelon Huh Treating Provider/Extender: Melburn Hake, HOYT Weeks in Treatment: 3 Constitutional Well-nourished and well-hydrated in no acute distress. Respiratory normal breathing without difficulty. Psychiatric this patient is able to make decisions and demonstrates good insight into disease process. Alert and Oriented x 3. pleasant and cooperative. Notes Upon inspection patient's wound bed showed signs of good granulation at this time.. She has a lot of new epithelization as well which is great news there is no signs of active infection which is also excellent. Electronic Signature(s) Signed: 05/05/2019 1:45:39 PM By: Worthy Keeler PA-C Entered By: Worthy Keeler on 05/05/2019 13:45:39 [name] ([id number]) -------------------------------------------------------------------------------- Physician Orders Details Patient Name: JUN, RIGHTMYER B. Date of Service: 05/05/2019 10:30 AM Medical Record Number: [id number] Patient Account Number: [id number] Date of Birth/Sex: 10-28-1945 (74 y.o. F) Treating RN: Army Melia Primary Care Provider: Lelon Huh Other Clinician: Referring Provider: Lelon Huh Treating Provider/Extender: Melburn Hake, HOYT Weeks in Treatment: 3 Verbal / Phone Orders: No Diagnosis Coding Wound Cleansing Wound #14 Right,Anterior Lower Leg o Dial antibacterial soap, wash wounds, rinse and pat dry prior to dressing wounds - HH nurse and in office o May shower with protection. - Do not get wraps wet Wound #17 Left,Anterior Lower Leg o Dial antibacterial soap, wash wounds, rinse and pat dry prior to dressing wounds - HH nurse and in office o May shower with protection. - Do not get wraps wet Primary Wound Dressing Wound #14 Right,Anterior Lower Leg o Silver Alginate Wound #17 Left,Anterior Lower Leg o Silver Alginate Secondary Dressing Wound #14 Right,Anterior Lower Leg o XtraSorb Wound #17 Left,Anterior Lower Leg o XtraSorb Dressing Change Frequency Wound #14 Right,Anterior Lower Leg o Change dressing every week Wound #17 Left,Anterior Lower Leg o Change dressing every week Follow-up Appointments Wound #14 Right,Anterior Lower Leg o Return Appointment in 1 week. o Nurse Visit as needed Wound #17 Left,Anterior Lower Leg o Return Appointment in 1 week. o Nurse Visit as needed Edema Control Wound #14 Right,Anterior Lower Leg o 3 Layer Compression System - Bilateral o Elevate legs to the level of the heart and pump ankles as often as possible Wound #17 Left,Anterior Lower Leg o 3 Layer Compression System - Bilateral o Elevate legs to the level of the heart and pump ankles as often as possible Electronic Signature(s) Signed: 05/05/2019 4:19:51 PM By: Army Melia Signed: 05/05/2019 5:05:18 PM By: Worthy Keeler PA-C Entered By: Army Melia on 05/05/2019 10:51:06 [name] ([id number]) SOLAE, NORLING ([id number]) -------------------------------------------------------------------------------- Problem List Details Patient Name: JAILYNN, LAVALAIS B. Date of Service: 05/05/2019 10:30 AM Medical Record Number:  [id number] Patient Account Number: [id number]  Date of Birth/Sex: [birth year] (74 y.o. F) Treating RN: Army Melia Primary Care Provider: Lelon Huh Other Clinician: Referring Provider: Lelon Huh Treating Provider/Extender: Melburn Hake, HOYT Weeks in Treatment: 3 Active Problems ICD-10 Encounter Code Description Active Date MDM Diagnosis E11.622 Type 2 diabetes mellitus with other skin ulcer 04/14/2019 No Yes I89.0 Lymphedema, not elsewhere classified 04/14/2019 No Yes L97.212 Non-pressure chronic ulcer of right calf with fat layer exposed 04/14/2019 No Yes L97.222 Non-pressure chronic ulcer of left calf with fat layer exposed 04/14/2019 No Yes Inactive Problems Resolved Problems Electronic Signature(s) Signed: 05/05/2019 11:12:35 AM By: Worthy Keeler PA-C Entered By: Worthy Keeler on 05/05/2019 11:12:34 [name] ([id number]) -------------------------------------------------------------------------------- Progress Note Details Patient Name: [name] Date of Service: 05/05/2019 10:30 AM Medical Record Number: [id number] Patient Account Number: [id number] Date of Birth/Sex: [birth year] (74 y.o. F) Treating RN: Army Melia Primary Care Provider: Lelon Huh Other Clinician: Referring Provider: Lelon Huh Treating Provider/Extender: Melburn Hake, HOYT Weeks in Treatment: 3 Subjective Chief Complaint Information obtained from Patient Patient seen for complaints of Non-Healing Wounds to both lower extremities History of Present Illness (HPI) The following HPI elements were documented for the patient's wound: Location: both lower extremity swelling with ulceration 74 year old patient who sees her PCP Dr. Lelon Huh was recently evaluated 10 days ago for diabetes mellitus, hypertension, CHF and hyperlipidemia. she also was noted to have ulcerations develop in her legs and she has been applying Silvadene dressings locally. In the past she has refused wound care  referrals.her cardiologist Dr. Saunders Revel saw her and put her on 40 mg of furosemide daily. last hemoglobin A1c was 7.7%. she was also placed on ciprofloxacin twice daily for 7 days and a urine culture was recommended. past medical history significant for coronary artery disease, diabetes mellitus, nonischemic cardiomyopathy and pulmonary hypertension. She is also status post heart catheterization and coronary angiography, tubal ligation and breast cyst removal in the past. She has never been a smoker. Patient had arterial studies done which showed bilateral ABIs are artificially elevated due to noncompressible and calcified vessels. Triphasic waveform throughout. Right great toe TBI is elevated while the left is normal. 10/23/2016 -- the patient is rather moribund from several issues including chronic back pain and knee pain and swelling of her legs. The large necrotic area on her left lateral anterior calf was bleeding on touch after washing her leg. There was a spot which needed silver nitrate cauterization and this was done appropriately. 11/06/2016 -- she is again noted to have friable bleeding from the left lower extremity wounds and this again had to be cauterized with silver nitrate to control the bleeding as pressure itself would not do it. 11/20/2016 -- the right lower extremity is completely healed and we have ordered 30-40 mm compression stocking's in both the dural layer and also a pair of juxta lites. 12/18/2016 -- she has not been wearing her compression stockings on her right leg and these have opened out into ulcerations again. Her left lower extremity has circumferential ulcerations now. I believe at this stage I would like to get her venous reflux studies done to make certain that there is no fixable superficial venous reflux. 12/29/2016 -- she has made an excellent recovery having continued with her appropriate doses of diuretics and elevation and exercise. She has not yet received her  right lower extremity juxta lites. Her venous duplex study is at the end of January 01/13/16 on evaluation today patient's wounds appeared to be overall doing much better  much less hyper granular than previous week's evaluation. The Hydrofera Blue Dressing's to be doing very well. She does tell me that she is having a little bit more discomfort in the posterior aspect of her leg where she has a wound at this point. Fortunately there appears to be no infection. 01/19/17 on evaluation today patient appears to be doing very well in regard to her bilateral lower extremity swelling and at this point in time her left lower extremity ulcers. Her wounds appear to be doing much better. She has been tolerating the dressing's at this point fortunately she did get the Juxta-Lite compression as of today as well. Nonetheless I am pleased that she has been tolerating everything so well and that her wounds looks so good. In fact she is an excellent granular surface no evidence of slough covering and I do not see any reason for likely debridement today. Especially in regard to the posterior leg. 01/26/17 on evaluation today patient appears to be doing decently well in regard to her wounds. The surface of the majority of her wounds is greatly improved. She has been using the Juxta-Lite compression which is excellent. Nonetheless the wound of the left posterior calf did require some debridement today otherwise the majority of the wounds did not require any debridement. The this was due to slough buildup on the surface. She also has a small skin flap where two of the ulcers actually connected and this has loosened up I'm concerned that things may not heal well with that flap but for the time being I'm not gonna do anything different in that regard. 02/23/17 on evaluation today patient's wound actually appears to be doing fairly well in regard to the remaining left lateral ulcer which we have been treated. With that being  said it does appear that her wrap actually slid down the wood bit over the past few days at least that there's actually some friction injury and blistering noted on the medial aspect of her malleolus of the left lower extremity. She continues to use the Juxta- Lite for the right lower extremity. With that being said no debridement is necessary today and these are very superficial injuries as far as the new injuries are concerned. 03/02/17 on evaluation today patient appears to be doing excellent in regard to her left lateral lower from the ulcers. In fact it appears that she is almost completely healed in regard to the ulcer we have been following and the new injuries which were caused by the wraps slipping down last week have been completely resolved which is excellent news. Overall I'm definitely pleased with the progress that she has made. Fortunately the rat did not cause any irritation as it did previously with the Unna wrap. 03/09/17 on evaluation today patient's wound appears to be completely healed. Obviously this is great news. She and her husband both are extremely pleased and excited to finally have this gone. Obviously she has been dealing with this wound for quite some time. Readmission: MADYSUN, THALL ([id number]) 04/14/2019 upon evaluation today patient appears for reevaluation here in our clinic although it has been since March 2019 since I last saw her. She unfortunately is having issues similar to what she had previous which are blisters and ulcerations of the bilateral lower extremities. Fortunately there is no evidence of active infection at this time based on what I am seeing. Unfortunately she is still having a lot of drainage and weeping. I do believe she needs to be compression wrap  in order to get this under control and she is previously tolerated a 3 layer compression wrap without any complications whatsoever. No fevers, chills, nausea, vomiting, or diarrhea. 04/28/2019 upon  evaluation today patient appears to be doing well with regard to the bilateral lower extremities at this time. She has been tolerating the dressing changes without complication. Fortunately there is no signs of active infection at this time. No fevers, chills, nausea, vomiting, or diarrhea. 05/05/2019 upon evaluation today patient appears to be doing excellent in regard to her legs. She has a lot of new skin growth and overall seems to be progressing quite nicely. Fortunately there is no signs of active infection at this time. No fevers, chills, nausea, vomiting, or diarrhea. Objective Constitutional Well-nourished and well-hydrated in no acute distress. Vitals Time Taken: 10:28 AM, Height: 64 in, Weight: 206 lbs, BMI: 35.4, Temperature: 98.6 F, Pulse: 62 bpm, Respiratory Rate: 16 breaths/min, Blood Pressure: 132/64 mmHg. Respiratory normal breathing without difficulty. Psychiatric this patient is able to make decisions and demonstrates good insight into disease process. Alert and Oriented x 3. pleasant and cooperative. General Notes: Upon inspection patient's wound bed showed signs of good granulation at this time.. She has a lot of new epithelization as well which is great news there is no signs of active infection which is also excellent. Integumentary (Hair, Skin) Wound #14 status is Open. Original cause of wound was Blister. The wound is located on the Right,Anterior Lower Leg. The wound measures 0.1cm length x 0.1cm width x 0.1cm depth; 0.008cm^2 area and 0.001cm^3 volume. There is Fat Layer (Subcutaneous Tissue) Exposed exposed. There is no tunneling or undermining noted. There is a large amount of serous drainage noted. The wound margin is flat and intact. There is large (67-100%) pink granulation within the wound bed. There is no necrotic tissue within the wound bed. Wound #17 status is Open. Original cause of wound was Blister. The wound is located on the Left,Anterior Lower Leg. The  wound measures 9cm length x 22cm width x 0.1cm depth; 155.509cm^2 area and 15.551cm^3 volume. There is Fat Layer (Subcutaneous Tissue) Exposed exposed. There is no tunneling or undermining noted. There is a large amount of serous drainage noted. The wound margin is flat and intact. There is large (67-100%) pink granulation within the wound bed. There is a small (1-33%) amount of necrotic tissue within the wound bed including Adherent Slough. 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 There was a Three Layer Compression Therapy Procedure by Army Melia, RN. RUTHANNA, MACCHIA ([id number]) Post procedure Diagnosis Wound #14: Same as Pre-Procedure Plan Wound Cleansing: Wound #14 Right,Anterior Lower Leg: Dial antibacterial soap, wash wounds, rinse and pat dry prior to dressing wounds - HH nurse and in office May shower with protection. - Do not get wraps wet Wound #17 Left,Anterior Lower Leg: Dial antibacterial soap, wash wounds, rinse and pat dry prior to dressing wounds - HH nurse and in office May shower with protection. - Do not get wraps wet Primary Wound Dressing: Wound #14 Right,Anterior Lower Leg: Silver Alginate Wound #17 Left,Anterior Lower Leg: Silver Alginate Secondary Dressing: Wound #14 Right,Anterior Lower Leg: XtraSorb Wound #17 Left,Anterior Lower Leg: XtraSorb Dressing Change Frequency: Wound #14 Right,Anterior Lower  Leg: Change dressing every week Wound #17 Left,Anterior Lower Leg: Change dressing every week Follow-up Appointments: Wound #14 Right,Anterior Lower Leg: Return Appointment in 1 week. Nurse Visit as needed Wound #17 Left,Anterior  Lower Leg: Return Appointment in 1 week. Nurse Visit as needed Edema Control: Wound #14 Right,Anterior Lower Leg: 3 Layer Compression System - Bilateral Elevate legs to the level of the heart and pump ankles as often as possible Wound #17 Left,Anterior Lower Leg: 3 Layer Compression System - Bilateral Elevate legs to the level of the heart and pump ankles as often as possible 1. Patient seems to be doing excellent with the current wound care measures therefore recommend continue with the silver alginate followed by Lauraine Rinne. 2. Also recommend that at this point we continue with the 3 layer compression wraps bilaterally that seems to be doing an excellent job for I do not think that I would make any changes since she is doing so well. 3. With regard to elevation she really needs to be elevating her legs more frequently I do believe. From what her husband seems to indicate she is not doing this nearly as much as she should. I think that would make a big improvement for her as well although she is already doing well that can just add to how quickly this resolves. We will see patient back for reevaluation in 1 week here in the clinic. If anything worsens or changes patient will contact our office for additional recommendations. Electronic Signature(s) Signed: 05/05/2019 2:23:18 PM By: Worthy Keeler PA-C Entered By: Worthy Keeler on 05/05/2019 14:23:18 [name] ([id number]) -------------------------------------------------------------------------------- SuperBill Details Patient Name: [name] Date of Service: 05/05/2019 Medical Record Number: [id number] Patient Account Number: [id number] Date of Birth/Sex: 1945-11-24 (74 y.o. 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 Physician Procedures CPT4 Code: 1497026 Description: 37858 - WC PHYS LEVEL 3 - EST PT Modifier: Quantity: 1 CPT4 Code: Description: ICD-10 Diagnosis Description E11.622 Type 2 diabetes mellitus with other skin ulcer I89.0 Lymphedema, not elsewhere classified L97.212 Non-pressure chronic ulcer of right calf with fat layer exposed L97.222 Non-pressure chronic ulcer of left  calf with fat layer exposed Modifier: Quantity: Electronic Signature(s) Signed: 05/05/2019 2:23:42 PM By: Worthy Keeler PA-C Entered By: Worthy Keeler on 05/05/2019 14:23:41

## 2019-05-05 NOTE — Progress Notes (Signed)
DARLEENE, CUMPIAN ([id number]) Visit Report for 05/05/2019 Arrival Information Details Patient Name: THARON, KITCH. Date of Service: 05/05/2019 10:30 AM Medical Record Number: [id number] Patient Account Number: [id number] Date of Birth/Sex: [birth year] (74 y.o. F) Treating RN: Army Melia Primary Care Donzella Carrol: Lelon Huh Other Clinician: Referring Aleasha Fregeau: Lelon Huh Treating Toribio Seiber/Extender: Melburn Hake, HOYT Weeks in Treatment: 3 Visit Information History Since Last Visit Added or deleted any medications: No Patient Arrived: Wheel Chair Any new allergies or adverse reactions: No Arrival Time: 10:27 Had a fall or experienced change in No Accompanied By: husband activities of daily living that may affect Transfer Assistance: None risk of falls: Patient Identification Verified: Yes Signs or symptoms of abuse/neglect since last visito No Has Dressing in Place as Prescribed: Yes Pain Present Now: No Electronic Signature(s) Signed: 05/05/2019 4:19:51 PM By: Army Melia Entered By: Army Melia on 05/05/2019 10:28:02 [name] ([id number]) -------------------------------------------------------------------------------- Compression Therapy Details Patient Name: [name] Date of Service: 05/05/2019 10:30 AM Medical Record Number: [id number] Patient Account Number: [id number] Date of Birth/Sex: [birth year] (74 y.o. F) Treating RN: Army Melia Primary Care Johne Buckle: Lelon Huh Other Clinician: Referring Lequita Meadowcroft: Lelon Huh Treating Philip Eckersley/Extender: Melburn Hake, HOYT Weeks in Treatment: 3 Compression Therapy Performed for Wound Assessment: Wound #14 Right,Anterior Lower Leg Performed By: Clinician Army Melia, RN Compression Type: Three Layer Post Procedure Diagnosis Same as Pre-procedure Electronic Signature(s) Signed: 05/05/2019 4:19:51 PM By: Army Melia Entered By: Army Melia on 05/05/2019 10:50:23 [name]  ([id number]) -------------------------------------------------------------------------------- Encounter Discharge Information Details Patient Name: DAVONNA, ERTL B. Date of Service: 05/05/2019 10:30 AM Medical Record Number: [id number] Patient Account Number: [id number] Date of Birth/Sex: 02/20/1945 (74 y.o. F) Treating RN: Army Melia Primary Care Rahshawn Remo: Lelon Huh Other Clinician: Referring Brya Simerly: Lelon Huh Treating Lataja Newland/Extender: Melburn Hake, HOYT Weeks in Treatment: 3 Encounter Discharge Information Items Discharge Condition: Stable Ambulatory Status: Wheelchair Discharge Destination: Home Transportation: Private Auto Accompanied By: spouse Schedule Follow-up Appointment: Yes Clinical Summary of Care: Electronic Signature(s) Signed: 05/05/2019 4:19:51 PM By: Army Melia Entered By: Army Melia on 05/05/2019 10:52:13 [name] ([id number]) -------------------------------------------------------------------------------- Lower Extremity Assessment Details Patient Name: ADIYAH, LAME B. Date of Service: 05/05/2019 10:30 AM Medical Record Number: [id number] Patient Account Number: [id number] Date of Birth/Sex: [birth year] (74 y.o. F) Treating RN: Army Melia Primary Care Aiman Noe: Lelon Huh Other Clinician: Referring Lansing Sigmon: Lelon Huh Treating Francisca Langenderfer/Extender: Melburn Hake, HOYT Weeks in Treatment: 3 Edema Assessment Assessed: [Left: No] [Right: No] Edema: [Left: Yes] [Right: Yes] Calf Left: Right: Point of Measurement: 30 cm From Medial Instep 47.5 cm 44 cm Ankle Left: Right: Point of Measurement: 10 cm From Medial Instep 25 cm 24 cm Vascular Assessment Pulses: Dorsalis Pedis Palpable: [Left:Yes] [Right:Yes] Electronic Signature(s) Signed: 05/05/2019 4:19:51 PM By: Army Melia Entered By: Army Melia on 05/05/2019 10:37:02 [name] ([id number]) -------------------------------------------------------------------------------- Multi Wound  Chart Details Patient Name: [name] Date of Service: 05/05/2019 10:30 AM Medical Record Number: [id number] Patient Account Number: [id number] Date of Birth/Sex: [birth year] (74 y.o. F) Treating RN: Army Melia Primary Care Dontrae Morini: Lelon Huh Other Clinician: Referring Velma Agnes: Lelon Huh Treating Madelyne Millikan/Extender: Melburn Hake, HOYT Weeks in Treatment: 3 Vital Signs Height(in): 64 Pulse(bpm): 86 Weight(lbs): 206 Blood Pressure(mmHg): 132/64 Body Mass Index(BMI): 35 Temperature(F): 98.6 Respiratory Rate(breaths/min): 16 Photos: [N/A:N/A] Wound Location: Right, Anterior Lower Leg Left, Anterior Lower Leg N/A Wounding Event: Blister Blister N/A Primary Etiology: Venous Leg Ulcer Venous Leg Ulcer N/A Secondary Etiology: Diabetic Wound/Ulcer of the Lower Diabetic Wound/Ulcer of the Lower N/A  Extremity Extremity Comorbid History: Lymphedema, Asthma, Congestive Lymphedema, Asthma, Congestive N/A Heart Failure, Coronary Artery Heart Failure, Coronary Artery Disease, Hypertension, Type II Disease, Hypertension, Type II Diabetes, Osteoarthritis Diabetes, Osteoarthritis Date Acquired: 03/31/2019 03/31/2019 N/A Weeks of Treatment: 3 3 N/A Wound Status: Open Open N/A Clustered Wound: Yes Yes N/A Clustered Quantity: 2 3 N/A Measurements L x W x D (cm) 0.1x0.1x0.1 9x22x0.1 N/A Area (cm) : 0.008 155.509 N/A Volume (cm) : 0.001 15.551 N/A % Reduction in Area: 100.00% -280.80% N/A % Reduction in Volume: 99.90% -280.80% N/A Classification: Full Thickness Without Exposed Full Thickness Without Exposed N/A Support Structures Support Structures Exudate Amount: Large Large N/A Exudate Type: Serous Serous N/A Exudate Color: amber amber N/A Wound Margin: Flat and Intact Flat and Intact N/A Granulation Amount: Large (67-100%) Large (67-100%) N/A Granulation Quality: Pink Pink N/A Necrotic Amount: None Present (0%) Small (1-33%) N/A Exposed Structures: Fat Layer (Subcutaneous  Tissue) Fat Layer (Subcutaneous Tissue) N/A Exposed: Yes Exposed: Yes Fascia: No Fascia: No Tendon: No Tendon: No Muscle: No Muscle: No Joint: No Joint: No Bone: No Bone: No Epithelialization: Large (67-100%) Medium (34-66%) N/A Treatment Notes Electronic Signature(s) SELIN, EISLER ([id number]) Signed: 05/05/2019 4:19:51 PM By: Army Melia Entered By: Army Melia on 05/05/2019 10:48:07 [name] ([id number]) -------------------------------------------------------------------------------- [place] Details Patient Name: BURNADETTE, BASKETT B. Date of Service: 05/05/2019 10:30 AM Medical Record Number: [id number] Patient Account Number: [id number] Date of Birth/Sex: [birth year] (74 y.o. F) Treating RN: Army Melia Primary Care Aretta Stetzel: Lelon Huh Other Clinician: Referring Glenda Spelman: Lelon Huh Treating Fatime Biswell/Extender: Melburn Hake, HOYT Weeks in Treatment: 3 Active Inactive Orientation to the Wound Care Program Nursing Diagnoses: Knowledge deficit related to the wound healing center program Goals: Patient/caregiver will verbalize understanding of the [facility] Program Date Initiated: 04/14/2019 Target Resolution Date: 05/09/2019 Goal Status: Active Interventions: Provide education on orientation to the wound center Notes: Venous Leg Ulcer Nursing Diagnoses: Potential for venous Insuffiency (use before diagnosis confirmed) Goals: Patient/caregiver will verbalize understanding of disease process and disease management Date Initiated: 04/14/2019 Target Resolution Date: 05/09/2019 Goal Status: Active Interventions: Assess peripheral edema status every visit. Notes: Wound/Skin Impairment Nursing Diagnoses: Impaired tissue integrity Goals: Ulcer/skin breakdown will have a volume reduction of 30% by week 4 Date Initiated: 04/14/2019 Target Resolution Date: 05/09/2019 Goal Status: Active Interventions: Assess ulceration(s) every  visit Notes: Electronic Signature(s) Signed: 05/05/2019 4:19:51 PM By: Army Melia Entered By: Army Melia on 05/05/2019 10:47:56 [name] ([id number]) -------------------------------------------------------------------------------- Pain Assessment Details Patient Name: [name] Date of Service: 05/05/2019 10:30 AM Medical Record Number: [id number] Patient Account Number: [id number] Date of Birth/Sex: April 28, 1945 (74 y.o. F) Treating RN: Army Melia Primary Care Debe Anfinson: Lelon Huh Other Clinician: Referring Medard Decuir: Lelon Huh Treating Xzaiver Vayda/Extender: Melburn Hake, HOYT Weeks in Treatment: 3 Active Problems Location of Pain Severity and Description of Pain Patient Has Paino No Site Locations Pain Management and Medication Current Pain Management: Electronic Signature(s) Signed: 05/05/2019 4:19:51 PM By: Army Melia Entered By: Army Melia on 05/05/2019 10:28:18 [name] ([id number]) -------------------------------------------------------------------------------- Patient/Caregiver Education Details Patient Name: [name]. Date of Service: 05/05/2019 10:30 AM Medical Record Number: [id number] Patient Account Number: [id number] Date of Birth/Gender: 12-22-1945 (74 y.o. F) Treating RN: Army Melia Primary Care Physician: Lelon Huh Other Clinician: Referring Physician: Lelon Huh Treating Physician/Extender: Sharalyn Ink in Treatment: 3 Education Assessment Education Provided To: Patient Education Topics Provided Wound/Skin Impairment: Handouts: Caring for Your Ulcer Methods: Demonstration, Explain/Verbal Responses: State content correctly Electronic Signature(s) Signed:  05/05/2019 4:19:51 PM By: Army Melia Entered By: Army Melia on 05/05/2019 10:51:39 [name] ([id number]) -------------------------------------------------------------------------------- Wound Assessment Details Patient Name: SHERON, TALLMAN B. Date of  Service: 05/05/2019 10:30 AM Medical Record Number: [id number] Patient Account Number: [id number] Date of Birth/Sex: 11-Jan-1945 (74 y.o. F) Treating RN: Army Melia Primary Care Ala Capri: Lelon Huh Other Clinician: Referring Angelynn Lemus: Lelon Huh Treating Random Dobrowski/Extender: Melburn Hake, HOYT Weeks in Treatment: 3 Wound Status Wound Number: 14 Primary Venous Leg Ulcer Etiology: Wound Location: Right, Anterior Lower Leg Secondary Diabetic Wound/Ulcer of the Lower Extremity Wounding Event: Blister Etiology: Date Acquired: 03/31/2019 Wound Open Weeks Of Treatment: 3 Status: Clustered Wound: Yes Comorbid Lymphedema, Asthma, Congestive Heart Failure, History: Coronary Artery Disease, Hypertension, Type II Diabetes, Osteoarthritis Photos Wound Measurements Length: (cm) 0.1 Width: (cm) 0.1 Depth: (cm) 0.1 Clustered Quantity: 2 Area: (cm) 0.008 Volume: (cm) 0.001 % Reduction in Area: 100% % Reduction in Volume: 99.9% Epithelialization: Large (67-100%) Tunneling: No Undermining: No Wound Description Classification: Full Thickness Without Exposed Support Structu Wound Margin: Flat and Intact Exudate Amount: Large Exudate Type: Serous Exudate Color: amber res Foul Odor After Cleansing: No Slough/Fibrino Yes Wound Bed Granulation Amount: Large (67-100%) Exposed Structure Granulation Quality: Pink Fascia Exposed: No Necrotic Amount: None Present (0%) Fat Layer (Subcutaneous Tissue) Exposed: Yes Tendon Exposed: No Muscle Exposed: No Joint Exposed: No Bone Exposed: No Treatment Notes Wound #14 (Right, Anterior Lower Leg) Notes AQUILLA, SHAMBLEY B. ([id number]) scell, xsorb, 3 layer bilateral Electronic Signature(s) Signed: 05/05/2019 4:19:51 PM By: Army Melia Entered By: Army Melia on 05/05/2019 10:42:26 [name] ([id number]) -------------------------------------------------------------------------------- Wound Assessment Details Patient Name: Devoria Albe  B. Date of Service: 05/05/2019 10:30 AM Medical Record Number: [id number] Patient Account Number: [id number] Date of Birth/Sex: May 14, 1945 (74 y.o. F) Treating RN: Army Melia Primary Care Krissy Orebaugh: Lelon Huh Other Clinician: Referring Zyree Traynham: Lelon Huh Treating Damyon Mullane/Extender: Melburn Hake, HOYT Weeks in Treatment: 3 Wound Status Wound Number: 17 Primary Venous Leg Ulcer Etiology: Wound Location: Left, Anterior Lower Leg Secondary Diabetic Wound/Ulcer of the Lower Extremity Wounding Event: Blister Etiology: Date Acquired: 03/31/2019 Wound Open Weeks Of Treatment: 3 Status: Clustered Wound: Yes Comorbid Lymphedema, Asthma, Congestive Heart Failure, History: Coronary Artery Disease, Hypertension, Type II Diabetes, Osteoarthritis Photos Wound Measurements Length: (cm) 9 Width: (cm) 22 Depth: (cm) 0.1 Clustered Quantity: 3 Area: (cm) 155.509 Volume: (cm) 15.551 % Reduction in Area: -280.8% % Reduction in Volume: -280.8% Epithelialization: Medium (34-66%) Tunneling: No Undermining: No Wound Description Classification: Full Thickness Without Exposed Support Structu Wound Margin: Flat and Intact Exudate Amount: Large Exudate Type: Serous Exudate Color: amber res Foul Odor After Cleansing: No Slough/Fibrino Yes Wound Bed Granulation Amount: Large (67-100%) Exposed Structure Granulation Quality: Pink Fascia Exposed: No Necrotic Amount: Small (1-33%) Fat Layer (Subcutaneous Tissue) Exposed: Yes Necrotic Quality: Adherent Slough Tendon Exposed: No Muscle Exposed: No Joint Exposed: No Bone Exposed: No Treatment Notes Wound #17 (Left, Anterior Lower Leg) Notes PRICSILLA, LINDVALL B. ([id number]) scell, xsorb, 3 layer bilateral Electronic Signature(s) Signed: 05/05/2019 4:19:51 PM By: Army Melia Entered By: Army Melia on 05/05/2019 10:42:50 [name] ([id number]) -------------------------------------------------------------------------------- Vitals  Details Patient Name: [name] Date of Service: 05/05/2019 10:30 AM Medical Record Number: [id number] Patient Account Number: [id number] Date of Birth/Sex: Jun 23, 1945 (74 y.o. F) Treating RN: Army Melia Primary Care Irina Okelly: Lelon Huh Other Clinician: Referring Darrious Youman: Lelon Huh Treating Jesus Poplin/Extender: Melburn Hake, HOYT Weeks in Treatment: 3 Vital Signs Time Taken: 10:28 Temperature (F): 98.6 Height (in): 64 Pulse (bpm): 62 Weight (lbs): 206 Respiratory  Rate (breaths/min): 16 Body Mass Index (BMI): 35.4 Blood Pressure (mmHg): 132/64 Reference Range: 80 - 120 mg / dl Electronic Signature(s) Signed: 05/05/2019 4:19:51 PM By: Army Melia Entered By: Army Melia on 05/05/2019 10:28:29

## 2019-05-06 NOTE — Addendum Note (Signed)
Addended by: Anselm Pancoast on: 05/06/2019 07:39 AM   Modules accepted: Orders

## 2019-05-12 ENCOUNTER — Encounter: Payer: Medicare Other | Admitting: Physician Assistant

## 2019-05-12 ENCOUNTER — Other Ambulatory Visit: Payer: Self-pay

## 2019-05-12 DIAGNOSIS — Z880 Allergy status to penicillin: Secondary | ICD-10-CM | POA: Diagnosis not present

## 2019-05-12 DIAGNOSIS — L97212 Non-pressure chronic ulcer of right calf with fat layer exposed: Secondary | ICD-10-CM | POA: Diagnosis not present

## 2019-05-12 DIAGNOSIS — Z881 Allergy status to other antibiotic agents status: Secondary | ICD-10-CM | POA: Diagnosis not present

## 2019-05-12 DIAGNOSIS — J45909 Unspecified asthma, uncomplicated: Secondary | ICD-10-CM | POA: Diagnosis not present

## 2019-05-12 DIAGNOSIS — E785 Hyperlipidemia, unspecified: Secondary | ICD-10-CM | POA: Diagnosis not present

## 2019-05-12 DIAGNOSIS — I509 Heart failure, unspecified: Secondary | ICD-10-CM | POA: Diagnosis not present

## 2019-05-12 DIAGNOSIS — I272 Pulmonary hypertension, unspecified: Secondary | ICD-10-CM | POA: Diagnosis not present

## 2019-05-12 DIAGNOSIS — I2510 Atherosclerotic heart disease of native coronary artery without angina pectoris: Secondary | ICD-10-CM | POA: Diagnosis not present

## 2019-05-12 DIAGNOSIS — I110 Hypertensive heart disease with heart failure: Secondary | ICD-10-CM | POA: Diagnosis not present

## 2019-05-12 DIAGNOSIS — I890 Lymphedema, not elsewhere classified: Secondary | ICD-10-CM | POA: Diagnosis not present

## 2019-05-12 DIAGNOSIS — I428 Other cardiomyopathies: Secondary | ICD-10-CM | POA: Diagnosis not present

## 2019-05-12 DIAGNOSIS — E11622 Type 2 diabetes mellitus with other skin ulcer: Secondary | ICD-10-CM | POA: Diagnosis not present

## 2019-05-12 DIAGNOSIS — M1990 Unspecified osteoarthritis, unspecified site: Secondary | ICD-10-CM | POA: Diagnosis not present

## 2019-05-12 DIAGNOSIS — L97222 Non-pressure chronic ulcer of left calf with fat layer exposed: Secondary | ICD-10-CM | POA: Diagnosis not present

## 2019-05-12 NOTE — Progress Notes (Addendum)
[name] ([id number]) Visit Report for 05/12/2019 Chief Complaint Document Details Patient Name: [name], LETT. Date of Service: 05/12/2019 2:45 PM Medical Record Number: [id number] Patient Account Number: [id number] Date of Birth/Sex: 1945-09-13 (74 y.o. F) Treating RN: Army Melia Primary Care Provider: Lelon Huh Other Clinician: Referring Provider: Lelon Huh Treating Provider/Extender: Melburn Hake, Marlet Korte Weeks in Treatment: 4 Information Obtained from: Patient Chief Complaint Patient seen for complaints of Non-Healing Wounds to both lower extremities Electronic Signature(s) Signed: 05/12/2019 2:50:11 PM By: Worthy Keeler PA-C Entered By: Worthy Keeler on 05/12/2019 14:50:11 [name] ([id number]) -------------------------------------------------------------------------------- HPI Details Patient Name: [name]. Date of Service: 05/12/2019 2:45 PM Medical Record Number: [id number] Patient Account Number: [id number] Date of Birth/Sex: 08-31-1945 (74 y.o. F) Treating RN: Army Melia Primary Care Provider: Lelon Huh Other Clinician: Referring Provider: Lelon Huh Treating Provider/Extender: Melburn Hake, Sasan Wilkie Weeks in Treatment: 4 History of Present Illness Location: both lower extremity swelling with ulceration HPI Description: 74 year old patient who sees her PCP Dr. Lelon Huh was recently evaluated 10 days ago for diabetes mellitus, hypertension, CHF and hyperlipidemia. she also was noted to have ulcerations develop in her legs and she has been applying Silvadene dressings locally. In the past she has refused wound care referrals.her cardiologist Dr. Saunders Revel saw her and put her on 40 mg of furosemide daily. last hemoglobin A1c was 7.7%. she was also placed on ciprofloxacin twice daily for 7 days and a urine culture was recommended. past medical history significant for coronary artery disease, diabetes mellitus, nonischemic cardiomyopathy and  pulmonary hypertension. She is also status post heart catheterization and coronary angiography, tubal ligation and breast cyst removal in the past. She has never been a smoker. Patient had arterial studies done which showed bilateral ABIs are artificially elevated due to noncompressible and calcified vessels. Triphasic waveform throughout. Right great toe TBI is elevated while the left is normal. 10/23/2016 -- the patient is rather moribund from several issues including chronic back pain and knee pain and swelling of her legs. The large necrotic area on her left lateral anterior calf was bleeding on touch after washing her leg. There was a spot which needed silver nitrate cauterization and this was done appropriately. 11/06/2016 -- she is again noted to have friable bleeding from the left lower extremity wounds and this again had to be cauterized with silver nitrate to control the bleeding as pressure itself would not do it. 11/20/2016 -- the right lower extremity is completely healed and we have ordered 30-40 mm compression stocking's in both the dural layer and also a pair of juxta lites. 12/18/2016 -- she has not been wearing her compression stockings on her right leg and these have opened out into ulcerations again. Her left lower extremity has circumferential ulcerations now. I believe at this stage I would like to get her venous reflux studies done to make certain that there is no fixable superficial venous reflux. 12/29/2016 -- she has made an excellent recovery having continued with her appropriate doses of diuretics and elevation and exercise. She has not yet received her right lower extremity juxta lites. Her venous duplex study is at the end of January 01/13/16 on evaluation today patient's wounds appeared to be overall doing much better much less hyper granular than previous week's evaluation. The Hydrofera Blue Dressing's to be doing very well. She does tell me that she is having a  little bit more discomfort in the posterior aspect of her leg where she has a wound  at this point. Fortunately there appears to be no infection. 01/19/17 on evaluation today patient appears to be doing very well in regard to her bilateral lower extremity swelling and at this point in time her left lower extremity ulcers. Her wounds appear to be doing much better. She has been tolerating the dressing's at this point fortunately she did get the Juxta-Lite compression as of today as well. Nonetheless I am pleased that she has been tolerating everything so well and that her wounds looks so good. In fact she is an excellent granular surface no evidence of slough covering and I do not see any reason for likely debridement today. Especially in regard to the posterior leg. 01/26/17 on evaluation today patient appears to be doing decently well in regard to her wounds. The surface of the majority of her wounds is greatly improved. She has been using the Juxta-Lite compression which is excellent. Nonetheless the wound of the left posterior calf did require some debridement today otherwise the majority of the wounds did not require any debridement. The this was due to slough buildup on the surface. She also has a small skin flap where two of the ulcers actually connected and this has loosened up I'm concerned that things may not heal well with that flap but for the time being I'm not gonna do anything different in that regard. 02/23/17 on evaluation today patient's wound actually appears to be doing fairly well in regard to the remaining left lateral ulcer which we have been treated. With that being said it does appear that her wrap actually slid down the wood bit over the past few days at least that there's actually some friction injury and blistering noted on the medial aspect of her malleolus of the left lower extremity. She continues to use the Juxta- Lite for the right lower extremity. With that being said no  debridement is necessary today and these are very superficial injuries as far as the new injuries are concerned. 03/02/17 on evaluation today patient appears to be doing excellent in regard to her left lateral lower from the ulcers. In fact it appears that she is almost completely healed in regard to the ulcer we have been following and the new injuries which were caused by the wraps slipping down last week have been completely resolved which is excellent news. Overall I'm definitely pleased with the progress that she has made. Fortunately the rat did not cause any irritation as it did previously with the Unna wrap. 03/09/17 on evaluation today patient's wound appears to be completely healed. Obviously this is great news. She and her husband both are extremely pleased and excited to finally have this gone. Obviously she has been dealing with this wound for quite some time. Readmission: 04/14/2019 upon evaluation today patient appears for reevaluation here in our clinic although it has been since March 2019 since I last saw her. She unfortunately is having issues similar to what she had previous which are blisters and ulcerations of the bilateral lower extremities. Fortunately there is no evidence of active infection at this time based on what I am seeing. Unfortunately she is still having a lot of drainage and weeping. I do believe she needs to be compression wrap in order to get this under control and she is previously tolerated a 3 layer compression wrap without any complications whatsoever. No fevers, chills, nausea, vomiting, or diarrhea. [name], [name] ([id number]) 04/28/2019 upon evaluation today patient appears to be doing well with regard to the  bilateral lower extremities at this time. She has been tolerating the dressing changes without complication. Fortunately there is no signs of active infection at this time. No fevers, chills, nausea, vomiting, or diarrhea. 05/05/2019 upon evaluation today  patient appears to be doing excellent in regard to her legs. She has a lot of new skin growth and overall seems to be progressing quite nicely. Fortunately there is no signs of active infection at this time. No fevers, chills, nausea, vomiting, or diarrhea. 05/12/2019 upon evaluation today patient appears to be doing very well with regard to her lower extremities. She is not completely healed at this point but nonetheless he is doing much better even compared to last week. I am very pleased with where things stand. No fevers, chills, nausea, vomiting, or diarrhea. Electronic Signature(s) Signed: 05/12/2019 5:13:57 PM By: Worthy Keeler PA-C Entered By: Worthy Keeler on 05/12/2019 17:13:57 [name] ([id number]) -------------------------------------------------------------------------------- Physical Exam Details Patient Name: [name], [name] B. Date of Service: 05/12/2019 2:45 PM Medical Record Number: [id number] Patient Account Number: [id number] Date of Birth/Sex: [birth year] (74 y.o. F) Treating RN: Army Melia Primary Care Provider: Lelon Huh Other Clinician: Referring Provider: Lelon Huh Treating Provider/Extender: Melburn Hake, Madalen Gavin Weeks in Treatment: 4 Notes Patient's wound bed currently showed signs of good granulation at this time. There does not appear to be any signs of active infection which is great news. No fevers, chills, nausea, vomiting, or diarrhea. Electronic Signature(s) Signed: 05/12/2019 5:15:13 PM By: Worthy Keeler PA-C Entered By: Worthy Keeler on 05/12/2019 17:15:12 [name] ([id number]) -------------------------------------------------------------------------------- Physician Orders Details Patient Name: [name], [name] B. Date of Service: 05/12/2019 2:45 PM Medical Record Number: [id number] Patient Account Number: [id number] Date of Birth/Sex: [birth year] (74 y.o. 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 - Do not get wraps wet Wound #17 Left,Anterior Lower Leg o Dial antibacterial soap, wash wounds, rinse and pat dry prior to dressing wounds - HH nurse and in office o May shower with protection. - Do not get wraps wet Primary Wound Dressing Wound #14 Right,Anterior Lower Leg o Silver Alginate Wound #17 Left,Anterior Lower Leg o Silver Alginate Secondary Dressing Wound #14 Right,Anterior Lower Leg o XtraSorb Wound #17 Left,Anterior Lower Leg o XtraSorb Dressing Change Frequency Wound #14 Right,Anterior Lower Leg o Change dressing every week Wound #17 Left,Anterior Lower Leg o Change dressing every week Follow-up Appointments Wound #14 Right,Anterior Lower Leg o Return Appointment in 1 week. o Nurse Visit as needed Wound #17 Left,Anterior Lower Leg o Return Appointment in 1 week. o Nurse Visit as needed Edema Control Wound #14 Right,Anterior Lower Leg o 3 Layer Compression System - Bilateral o Elevate legs to the level of the heart and pump ankles as often as possible Wound #17 Left,Anterior Lower Leg o 3 Layer Compression System - Bilateral Art, Brittaney B. ([id number]) o Elevate legs to the level of the heart and pump ankles as often as possible  Electronic Signature(s) Signed: 05/12/2019 4:16:16 PM By: Army Melia Signed:  05/13/2019 11:00:46 AM By: Worthy Keeler PA-C Entered By: Army Melia on 05/12/2019 15:40:04 [name] ([id number]) -------------------------------------------------------------------------------- Problem List Details Patient Name: ELLANORE, VANHOOK B. Date of Service: 05/12/2019 2:45 PM Medical Record Number: [id number] Patient Account Number: [id number] Date of Birth/Sex: 20-Jan-1945 (74 y.o. 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 Date of Service: 05/12/2019 2:45 PM Medical Record Number: [id number] Patient Account Number: [id number] Date of Birth/Sex: 03-23-1945 (74 y.o. F) Treating RN: Army Melia Primary Care Provider: Lelon Huh Other Clinician: Referring Provider: Lelon Huh Treating Provider/Extender: Melburn Hake, Alizza Sacra Weeks in Treatment: 4 Subjective Chief Complaint Information obtained from Patient Patient seen for complaints of Non-Healing Wounds to both lower extremities History of Present Illness (HPI) The following HPI elements were documented for the patient's wound: Location: both lower extremity swelling with  ulceration 74 year old patient who sees her PCP Dr. Lelon Huh was recently evaluated 10 days ago for diabetes mellitus, hypertension, CHF and hyperlipidemia. she also was noted to have ulcerations develop in her legs and she has been applying Silvadene dressings locally. In the past she has refused wound care referrals.her cardiologist Dr. Saunders Revel saw her and put her on 40 mg of furosemide daily. last hemoglobin A1c was 7.7%. she was also placed on ciprofloxacin twice daily for 7 days and a urine culture was recommended. past medical history significant for coronary artery disease, diabetes mellitus, nonischemic cardiomyopathy and pulmonary hypertension. She is also status post heart catheterization and coronary angiography, tubal ligation and breast cyst removal in the past. She has never been a smoker. Patient had arterial studies done which showed bilateral ABIs are artificially elevated due to noncompressible and calcified vessels. Triphasic waveform throughout. Right great toe TBI is elevated while the left is normal. 10/23/2016 -- the patient is rather moribund from several issues including chronic back pain and knee pain and swelling of her legs. The large necrotic area on her left lateral anterior calf was bleeding on touch after washing her leg. There was a spot which needed silver nitrate cauterization and this was done appropriately. 11/06/2016 -- she is again noted to have friable bleeding from the left lower extremity wounds and this again had to be cauterized with silver nitrate to control the bleeding as pressure itself would not do it. 11/20/2016 -- the right lower extremity is completely healed and we have ordered 30-40 mm compression stocking's in both the dural layer and also a pair of juxta lites. 12/18/2016 -- she has not been wearing her compression stockings on her right leg and these have opened out into ulcerations again. Her left lower extremity has circumferential  ulcerations now. I believe at this stage I would like to get her venous reflux studies done to make certain that there is no fixable superficial venous reflux. 12/29/2016 --  she has made an excellent recovery having continued with her appropriate doses of diuretics and elevation and exercise. She has not yet received her right lower extremity juxta lites. Her venous duplex study is at the end of January 01/13/16 on evaluation today patient's wounds appeared to be overall doing much better much less hyper granular than previous week's evaluation. The Hydrofera Blue Dressing's to be doing very well. She does tell me that she is having a little bit more discomfort in the posterior aspect of her leg where she has a wound at this point. Fortunately there appears to be no infection. 01/19/17 on evaluation today patient appears to be doing very well in regard to her bilateral lower extremity swelling and at this point in time her left lower extremity ulcers. Her wounds appear to be doing much better. She has been tolerating the dressing's at this point fortunately she did get the Juxta-Lite compression as of today as well. Nonetheless I am pleased that she has been tolerating everything so well and that her wounds looks so good. In fact she is an excellent granular surface no evidence of slough covering and I do not see any reason for likely debridement today. Especially in regard to the posterior leg. 01/26/17 on evaluation today patient appears to be doing decently well in regard to her wounds. The surface of the majority of her wounds is greatly improved. She has been using the Juxta-Lite compression which is excellent. Nonetheless the wound of the left posterior calf did require some debridement today otherwise the majority of the wounds did not require any debridement. The this was due to slough buildup on the surface. She also has a small skin flap where two of the ulcers actually connected and this has  loosened up I'm concerned that things may not heal well with that flap but for the time being I'm not gonna do anything different in that regard. 02/23/17 on evaluation today patient's wound actually appears to be doing fairly well in regard to the remaining left lateral ulcer which we have been treated. With that being said it does appear that her wrap actually slid down the wood bit over the past few days at least that there's actually some friction injury and blistering noted on the medial aspect of her malleolus of the left lower extremity. She continues to use the Juxta- Lite for the right lower extremity. With that being said no debridement is necessary today and these are very superficial injuries as far as the new injuries are concerned. 03/02/17 on evaluation today patient appears to be doing excellent in regard to her left lateral lower from the ulcers. In fact it appears that she is almost completely healed in regard to the ulcer we have been following and the new injuries which were caused by the wraps slipping down last week have been completely resolved which is excellent news. Overall I'm definitely pleased with the progress that she has made. Fortunately the rat did not cause any irritation as it did previously with the Unna wrap. 03/09/17 on evaluation today patient's wound appears to be completely healed. Obviously this is great news. She and her husband both are extremely pleased and excited to finally have this gone. Obviously she has been dealing with this wound for quite some time. Readmission: [name], [name] ([id number]) 04/14/2019 upon evaluation today patient appears for reevaluation here in our clinic although it has been since March 2019 since I last saw her. She unfortunately is having issues similar  to what she had previous which are blisters and ulcerations of the bilateral lower extremities. Fortunately there is no evidence of active infection at this time based on what I am  seeing. Unfortunately she is still having a lot of drainage and weeping. I do believe she needs to be compression wrap in order to get this under control and she is previously tolerated a 3 layer compression wrap without any complications whatsoever. No fevers, chills, nausea, vomiting, or diarrhea. 04/28/2019 upon evaluation today patient appears to be doing well with regard to the bilateral lower extremities at this time. She has been tolerating the dressing changes without complication. Fortunately there is no signs of active infection at this time. No fevers, chills, nausea, vomiting, or diarrhea. 05/05/2019 upon evaluation today patient appears to be doing excellent in regard to her legs. She has a lot of new skin growth and overall seems to be progressing quite nicely. Fortunately there is no signs of active infection at this time. No fevers, chills, nausea, vomiting, or diarrhea. 05/12/2019 upon evaluation today patient appears to be doing very well with regard to her lower extremities. She is not completely healed at this point but nonetheless he is doing much better even compared to last week. I am very pleased with where things stand. No fevers, chills, nausea, vomiting, or diarrhea. Objective Constitutional Vitals Time Taken: 2:50 PM, Height: 64 in, Weight: 206 lbs, BMI: 35.4, Temperature: 98.4 F, Pulse: 70 bpm, Respiratory Rate: 18 breaths/min, Blood Pressure: 147/54 mmHg. Integumentary (Hair, Skin) Wound #14 status is Open. Original cause of wound was Blister. The wound is located on the Right,Anterior Lower Leg. The wound measures 2.2cm length x 7cm width x 0.1cm depth; 12.095cm^2 area and 1.21cm^3 volume. There is Fat Layer (Subcutaneous Tissue) Exposed exposed. There is no tunneling or undermining noted. There is a large amount of serous drainage noted. The wound margin is flat and intact. There is large (67-100%) pink granulation within the wound bed. There is no necrotic tissue  within the wound bed. Wound #17 status is Open. Original cause of wound was Blister. The wound is located on the Left,Anterior Lower Leg. The wound measures 12cm length x 22cm width x 0.1cm depth; 207.345cm^2 area and 20.735cm^3 volume. There is Fat Layer (Subcutaneous Tissue) Exposed exposed. There is no tunneling or undermining noted. There is a large amount of serous drainage noted. The wound margin is flat and intact. There is large (67-100%) pink granulation within the wound bed. There is a small (1-33%) amount of necrotic tissue within the wound bed including Adherent Slough. 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 There was a Three Layer Compression Therapy Procedure by Army Melia, RN. Post procedure Diagnosis Wound #14: Same as Pre-Procedure [name], [name] ([id number]) Plan Wound Cleansing: Wound #14 Right,Anterior Lower Leg: Dial antibacterial soap, wash wounds, rinse and pat dry prior to dressing wounds - HH nurse and in office May shower with protection. - Do not get wraps wet Wound #17 Left,Anterior Lower Leg: Dial antibacterial soap, wash wounds, rinse and pat dry prior to dressing wounds - HH nurse and in office May shower with protection. - Do not get wraps wet Primary Wound Dressing: Wound #14 Right,Anterior Lower Leg:  Silver Alginate Wound #17 Left,Anterior Lower Leg: Silver Alginate Secondary Dressing: Wound #14 Right,Anterior Lower Leg: XtraSorb Wound #17 Left,Anterior Lower Leg: XtraSorb Dressing Change Frequency: Wound #14 Right,Anterior Lower Leg: Change dressing every week Wound #17 Left,Anterior Lower Leg: Change dressing every  week Follow-up Appointments: Wound #14 Right,Anterior Lower Leg: Return Appointment in 1 week. Nurse Visit as needed Wound #17 Left,Anterior Lower Leg: Return Appointment in 1 week. Nurse Visit as needed Edema Control: Wound #14 Right,Anterior Lower Leg: 3 Layer Compression System - Bilateral Elevate legs to the level of the heart and pump ankles as often as possible Wound #17 Left,Anterior Lower Leg: 3 Layer Compression System - Bilateral Elevate legs to the level of the heart and pump ankles as often as possible 1. I am going to recommend at this time that we go ahead and continue with the current wound care measures including the XtraSorb along with a silver alginate dressing. 2. I am also can recommend at this time that we continue to monitor for any signs of infection or worsening. Obviously right now she seems to be doing extremely well. 3. I would also recommend she continue to elevate her legs much as possible to try to keep the edema under good control. We will see patient back for reevaluation in 1 week here in the clinic. If anything worsens or changes patient will contact our office for additional recommendations. Electronic Signature(s) Signed: 05/12/2019 5:16:09 PM By: Worthy Keeler PA-C Entered By: Worthy Keeler on 05/12/2019 17:16:09 [name] ([id number]) -------------------------------------------------------------------------------- SuperBill Details Patient Name: [name] Date of Service: 05/12/2019 Medical Record Number: [id number] Patient Account Number: [id number] Date of Birth/Sex: 1945/05/20 (74 y.o. 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 CPT4: ICD-10 Diagnosis Description E11.622 Type 2 diabetes mellitus with other skin ulcer Physician Procedures CPT4 Code: 2297989 Description: 21194 - WC PHYS LEVEL 3 - EST PT Modifier: Quantity: 1 CPT4 Code: Description: ICD-10 Diagnosis Description E11.622 Type 2 diabetes mellitus with other skin ulcer I89.0 Lymphedema, not elsewhere classified L97.222 Non-pressure chronic ulcer of left calf with fat layer exposed L97.212 Non-pressure chronic ulcer of right  calf with fat layer exposed Modifier: Quantity: Electronic Signature(s) Signed: 05/13/2019 9:47:44 AM By: Sharon Mt Signed: 05/13/2019 11:00:46 AM By: Worthy Keeler PA-C Previous Signature: 05/12/2019 5:19:40 PM Version By: Worthy Keeler PA-C Entered By: Sharon Mt on 05/13/2019 09:47:43

## 2019-05-12 NOTE — Progress Notes (Addendum)
VIHANA, KYDD ([id number]) Visit Report for 05/12/2019 Arrival Information Details Patient Name: [name], [name]. Date of Service: 05/12/2019 2:45 PM Medical Record Number: [id number] Patient Account Number: [id number] Date of Birth/Sex: 1945/11/08 (74 y.o. F) Treating RN: Army Melia Primary Care Moxon Messler: Lelon Huh Other Clinician: Referring Ruthell Feigenbaum: Lelon Huh Treating Concetta Guion/Extender: Melburn Hake, HOYT Weeks in Treatment: 4 Visit Information History Since Last Visit Added or deleted any medications: No Patient Arrived: Wheel Chair Any new allergies or adverse reactions: No Arrival Time: 14:50 Had a fall or experienced change in No Accompanied By: husband activities of daily living that may affect Transfer Assistance: None risk of falls: Patient Identification Verified: Yes Signs or symptoms of abuse/neglect since last visito No Secondary Verification Process Completed: Yes Hospitalized since last visit: No Implantable device outside of the clinic excluding No cellular tissue based products placed in the center since last visit: Has Dressing in Place as Prescribed: Yes Has Compression in Place as Prescribed: Yes Pain Present Now: Yes Electronic Signature(s) Signed: 05/12/2019 4:36:30 PM By: Lorine Bears RCP, RRT, CHT Entered By: Lorine Bears on 05/12/2019 14:51:23 [name] ([id number]) -------------------------------------------------------------------------------- Compression Therapy Details Patient Name: Jillian Albe B. Date of Service: 05/12/2019 2:45 PM Medical Record Number: [id number] Patient Account Number: [id number] Date of Birth/Sex: 09-28-1945 (74 y.o. F) Treating RN: Army Melia Primary Care Abigayl Hor: Lelon Huh Other Clinician: Referring Anella Nakata: Lelon Huh Treating Ivory Maduro/Extender: Melburn Hake, HOYT Weeks in Treatment: 4 Compression Therapy Performed for Wound Assessment: Wound #14 Right,Anterior Lower  Leg Performed By: Clinician Army Melia, RN Compression Type: Three Layer Post Procedure Diagnosis Same as Pre-procedure Electronic Signature(s) Signed: 05/12/2019 4:16:16 PM By: Army Melia Entered By: Army Melia on 05/12/2019 15:39:32 [name] ([id number]) -------------------------------------------------------------------------------- Encounter Discharge Information Details Patient Name: [name], [name] Date of Service: 05/12/2019 2:45 PM Medical Record Number: [id number] Patient Account Number: [id number] Date of Birth/Sex: [birth year] (74 y.o. F) Treating RN: Army Melia Primary Care Pasha Gadison: Lelon Huh Other Clinician: Referring Marishka Rentfrow: Lelon Huh Treating Hoke Baer/Extender: Melburn Hake, HOYT Weeks in Treatment: 4 Encounter Discharge Information Items Discharge Condition: Stable Ambulatory Status: Wheelchair Discharge Destination: Home Transportation: Private Auto Accompanied By: husband Schedule Follow-up Appointment: Yes Clinical Summary of Care: Electronic Signature(s) Signed: 05/12/2019 4:16:16 PM By: Army Melia Entered By: Army Melia on 05/12/2019 15:41:10 [name] ([id number]) -------------------------------------------------------------------------------- Lower Extremity Assessment Details Patient Name: [name], [name] Date of Service: 05/12/2019 2:45 PM Medical Record Number: [id number] Patient Account Number: [id number] Date of Birth/Sex: 1945/12/23 (74 y.o. F) Treating RN: Montey Hora Primary Care Tilmon Wisehart: Lelon Huh Other Clinician: Referring Anesa Fronek: Lelon Huh Treating Shenelle Klas/Extender: Melburn Hake, HOYT Weeks in Treatment: 4 Edema Assessment Assessed: [Left: No] [Right: No] Edema: [Left: Yes] [Right: Yes] Calf Left: Right: Point of Measurement: 30 cm From Medial Instep 47 cm 43.5 cm Ankle Left: Right: Point of Measurement: 10 cm From Medial Instep 25 cm 24 cm Vascular Assessment Pulses: Dorsalis  Pedis Palpable: [Left:Yes] [Right:Yes] Electronic Signature(s) Signed: 05/12/2019 4:26:48 PM By: Montey Hora Entered By: Montey Hora on 05/12/2019 15:15:07 [name] ([id number]) -------------------------------------------------------------------------------- Multi Wound Chart Details Patient Name: Jillian Albe B. Date of Service: 05/12/2019 2:45 PM Medical Record Number: [id number] Patient Account Number: [id number] Date of Birth/Sex: December 28, 1945 (74 y.o. F) Treating RN: Army Melia Primary Care Letesha Klecker: Lelon Huh Other Clinician: Referring Mikiala Fugett: Lelon Huh Treating Kenyon Eichelberger/Extender: Melburn Hake, HOYT Weeks in Treatment: 4 Vital Signs Height(in): 64 Pulse(bpm): 70 Weight(lbs): 206 Blood Pressure(mmHg): 147/54 Body Mass Index(BMI): 35 Temperature(F): 98.4 Respiratory Rate(breaths/min):  62 Photos: [N/A:N/A] Wound Location: Right, Anterior Lower Leg Left, Anterior Lower Leg N/A Wounding Event: Blister Blister N/A Primary Etiology: Venous Leg Ulcer Venous Leg Ulcer N/A Secondary Etiology: Diabetic Wound/Ulcer of the Lower Diabetic Wound/Ulcer of the Lower N/A Extremity Extremity Comorbid History: Lymphedema, Asthma, Congestive Lymphedema, Asthma, Congestive N/A Heart Failure, Coronary Artery Heart Failure, Coronary Artery Disease, Hypertension, Type II Disease, Hypertension, Type II Diabetes, Osteoarthritis Diabetes, Osteoarthritis Date Acquired: 03/31/2019 03/31/2019 N/A Weeks of Treatment: 4 4 N/A Wound Status: Open Open N/A Clustered Wound: Yes Yes N/A Clustered Quantity: 2 3 N/A Measurements L x W x D (cm) 2.2x7x0.1 12x22x0.1 N/A Area (cm) : 12.095 207.345 N/A Volume (cm) : 1.21 20.735 N/A % Reduction in Area: 33.80% -407.70% N/A % Reduction in Volume: 33.70% -407.70% N/A Classification: Full Thickness Without Exposed Full Thickness Without Exposed N/A Support Structures Support Structures Exudate Amount: Large Large N/A Exudate Type: Serous  Serous N/A Exudate Color: amber amber N/A Wound Margin: Flat and Intact Flat and Intact N/A Granulation Amount: Large (67-100%) Large (67-100%) N/A Granulation Quality: Pink Pink N/A Necrotic Amount: None Present (0%) Small (1-33%) N/A Exposed Structures: Fat Layer (Subcutaneous Tissue) Fat Layer (Subcutaneous Tissue) N/A Exposed: Yes Exposed: Yes Fascia: No Fascia: No Tendon: No Tendon: No MELVA, FAUX B. ([id number]) Muscle: No Muscle: No Joint: No Joint: No Bone: No Bone: No Epithelialization: Large (67-100%) Medium (34-66%) N/A Treatment Notes Electronic Signature(s) Signed: 05/12/2019 4:16:16 PM By: Army Melia Entered By: Army Melia on 05/12/2019 15:36:28 [name] ([id number]) -------------------------------------------------------------------------------- [place] Details Patient Name: JAYMARIE, YEAKEL B. Date of Service: 05/12/2019 2:45 PM Medical Record Number: [id number] Patient Account Number: [id number] Date of Birth/Sex: June 29, 1945 (74 y.o. F) Treating RN: Army Melia Primary Care Jen Eppinger: Lelon Huh Other Clinician: Referring Daysy Santini: Lelon Huh Treating Clothilde Tippetts/Extender: Melburn Hake, HOYT Weeks in Treatment: 4 Active Inactive Orientation to the Wound Care Program Nursing Diagnoses: Knowledge deficit related to the wound healing center program Goals: Patient/caregiver will verbalize understanding of the [place] Program Date Initiated: 04/14/2019 Target Resolution Date: 05/09/2019 Goal Status: Active Interventions: Provide education on orientation to the wound center Notes: Venous Leg Ulcer Nursing Diagnoses: Potential for venous Insuffiency (use before diagnosis confirmed) Goals: Patient/caregiver will verbalize understanding of disease process and disease management Date Initiated: 04/14/2019 Target Resolution Date: 05/09/2019 Goal Status: Active Interventions: Assess peripheral edema status every  visit. Notes: Wound/Skin Impairment Nursing Diagnoses: Impaired tissue integrity Goals: Ulcer/skin breakdown will have a volume reduction of 30% by week 4 Date Initiated: 04/14/2019 Target Resolution Date: 05/09/2019 Goal Status: Active Interventions: Assess ulceration(s) every visit Notes: Electronic Signature(s) Signed: 05/12/2019 4:16:16 PM By: Army Melia Entered By: Army Melia on 05/12/2019 15:36:19 [name] ([id number]) -------------------------------------------------------------------------------- Pain Assessment Details Patient Name: Jillian Albe B. Date of Service: 05/12/2019 2:45 PM Medical Record Number: [id number] Patient Account Number: [id number] Date of Birth/Sex: [birth year] (74 y.o. F) Treating RN: Montey Hora Primary Care Bernisha Verma: Lelon Huh Other Clinician: Referring Jeffifer Rabold: Lelon Huh Treating Georgia Baria/Extender: Melburn Hake, HOYT Weeks in Treatment: 4 Active Problems Location of Pain Severity and Description of Pain Patient Has Paino No Site Locations Pain Management and Medication Current Pain Management: Electronic Signature(s) Signed: 05/12/2019 4:26:48 PM By: Montey Hora Entered By: Montey Hora on 05/12/2019 15:13:55 [name] ([id number]) -------------------------------------------------------------------------------- Patient/Caregiver Education Details Patient Name: [name]. Date of Service: 05/12/2019 2:45 PM Medical Record Number: [id number] Patient Account Number: [id number] Date of Birth/Gender: [birth year] (74 y.o. F) Treating RN: Army Melia Primary Care Physician: Lelon Huh  Other Clinician: Referring Physician: Lelon Huh Treating Physician/Extender: Sharalyn Ink in Treatment: 4 Education Assessment Education Provided To: Patient Education Topics Provided Wound/Skin Impairment: Handouts: Caring for Your Ulcer Methods: Demonstration, Explain/Verbal Responses: State content  correctly Electronic Signature(s) Signed: 05/12/2019 4:16:16 PM By: Army Melia Entered By: Army Melia on 05/12/2019 15:40:35 [name] ([id number]) -------------------------------------------------------------------------------- Wound Assessment Details Patient Name: ROMONA, MURDY B. Date of Service: 05/12/2019 2:45 PM Medical Record Number: [id number] Patient Account Number: [id number] Date of Birth/Sex: January 06, 1945 (74 y.o. F) Treating RN: Montey Hora Primary Care Lakyn Alsteen: Lelon Huh Other Clinician: Referring Sharen Youngren: Lelon Huh Treating Keshia Weare/Extender: Melburn Hake, HOYT Weeks in Treatment: 4 Wound Status Wound Number: 14 Primary Venous Leg Ulcer Etiology: Wound Location: Right, Anterior Lower Leg Secondary Diabetic Wound/Ulcer of the Lower Extremity Wounding Event: Blister Etiology: Date Acquired: 03/31/2019 Wound Open Weeks Of Treatment: 4 Status: Clustered Wound: Yes Comorbid Lymphedema, Asthma, Congestive Heart Failure, History: Coronary Artery Disease, Hypertension, Type II Diabetes, Osteoarthritis Photos Wound Measurements Length: (cm) 2.2 Width: (cm) 7 Depth: (cm) 0.1 Clustered Quantity: 2 Area: (cm) 12.095 Volume: (cm) 1.21 % Reduction in Area: 33.8% % Reduction in Volume: 33.7% Epithelialization: Large (67-100%) Tunneling: No Undermining: No Wound Description Classification: Full Thickness Without Exposed Support Structu Wound Margin: Flat and Intact Exudate Amount: Large Exudate Type: Serous Exudate Color: amber res Foul Odor After Cleansing: No Slough/Fibrino Yes Wound Bed Granulation Amount: Large (67-100%) Exposed Structure Granulation Quality: Pink Fascia Exposed: No Necrotic Amount: None Present (0%) Fat Layer (Subcutaneous Tissue) Exposed: Yes Tendon Exposed: No Muscle Exposed: No Joint Exposed: No Bone Exposed: No Treatment Notes Wound #14 (Right, Anterior Lower Leg) Notes TASHALA, CUMBO B. ([id number]) scell, xsorb,  3 layer bilateral Electronic Signature(s) Signed: 05/12/2019 4:26:48 PM By: Montey Hora Entered By: Montey Hora on 05/12/2019 [place], [place]. ([id number]) -------------------------------------------------------------------------------- Wound Assessment Details Patient Name: Jillian Albe B. Date of Service: 05/12/2019 2:45 PM Medical Record Number: [id number] Patient Account Number: [id number] Date of Birth/Sex: August 23, 1945 (74 y.o. F) Treating RN: Montey Hora Primary Care Daegon Deiss: Lelon Huh Other Clinician: Referring Nikkol Pai: Lelon Huh Treating Jolanda Mccann/Extender: Melburn Hake, HOYT Weeks in Treatment: 4 Wound Status Wound Number: 17 Primary Venous Leg Ulcer Etiology: Wound Location: Left, Anterior Lower Leg Secondary Diabetic Wound/Ulcer of the Lower Extremity Wounding Event: Blister Etiology: Date Acquired: 03/31/2019 Wound Open Weeks Of Treatment: 4 Status: Clustered Wound: Yes Comorbid Lymphedema, Asthma, Congestive Heart Failure, History: Coronary Artery Disease, Hypertension, Type II Diabetes, Osteoarthritis Photos Wound Measurements Length: (cm) 12 Width: (cm) 22 Depth: (cm) 0.1 Clustered Quantity: 3 Area: (cm) 207.345 Volume: (cm) 20.735 % Reduction in Area: -407.7% % Reduction in Volume: -407.7% Epithelialization: Medium (34-66%) Tunneling: No Undermining: No Wound Description Classification: Full Thickness Without Exposed Support Structu Wound Margin: Flat and Intact Exudate Amount: Large Exudate Type: Serous Exudate Color: amber res Foul Odor After Cleansing: No Slough/Fibrino Yes Wound Bed Granulation Amount: Large (67-100%) Exposed Structure Granulation Quality: Pink Fascia Exposed: No Necrotic Amount: Small (1-33%) Fat Layer (Subcutaneous Tissue) Exposed: Yes Necrotic Quality: Adherent Slough Tendon Exposed: No Muscle Exposed: No Joint Exposed: No Bone Exposed: No Treatment Notes Wound #17 (Left, Anterior Lower  Leg) Notes NESSIE, NONG B. ([id number]) scell, xsorb, 3 layer bilateral Electronic Signature(s) Signed: 05/12/2019 4:26:48 PM By: Montey Hora Entered By: Montey Hora on 05/12/2019 15:19:37 [name] ([id number]) -------------------------------------------------------------------------------- [place] Details Patient Name: Jillian Albe B. Date of Service: 05/12/2019 2:45 PM Medical Record Number: [id number] Patient Account Number: [id number] Date of Birth/Sex: 1945-01-15 (74 y.o. F) Treating  RN: Army Melia Primary Care Shatara Stanek: Lelon Huh Other Clinician: Referring Dayjah Selman: Lelon Huh Treating Rosmary Dionisio/Extender: Melburn Hake, HOYT Weeks in Treatment: 4 Vital Signs Time Taken: 14:50 Temperature (F): 98.4 Height (in): 64 Pulse (bpm): 70 Weight (lbs): 206 Respiratory Rate (breaths/min): 18 Body Mass Index (BMI): 35.4 Blood Pressure (mmHg): 147/54 Reference Range: 80 - 120 mg / dl Electronic Signature(s) Signed: 05/12/2019 4:36:30 PM By: Lorine Bears RCP, RRT, CHT Entered By: Lorine Bears on 05/12/2019 14:53:15

## 2019-05-19 ENCOUNTER — Other Ambulatory Visit: Payer: Self-pay

## 2019-05-19 DIAGNOSIS — I272 Pulmonary hypertension, unspecified: Secondary | ICD-10-CM | POA: Diagnosis not present

## 2019-05-19 DIAGNOSIS — Z881 Allergy status to other antibiotic agents status: Secondary | ICD-10-CM | POA: Diagnosis not present

## 2019-05-19 DIAGNOSIS — E785 Hyperlipidemia, unspecified: Secondary | ICD-10-CM | POA: Diagnosis not present

## 2019-05-19 DIAGNOSIS — I428 Other cardiomyopathies: Secondary | ICD-10-CM | POA: Diagnosis not present

## 2019-05-19 DIAGNOSIS — L97222 Non-pressure chronic ulcer of left calf with fat layer exposed: Secondary | ICD-10-CM | POA: Diagnosis not present

## 2019-05-19 DIAGNOSIS — M1990 Unspecified osteoarthritis, unspecified site: Secondary | ICD-10-CM | POA: Diagnosis not present

## 2019-05-19 DIAGNOSIS — I509 Heart failure, unspecified: Secondary | ICD-10-CM | POA: Diagnosis not present

## 2019-05-19 DIAGNOSIS — J45909 Unspecified asthma, uncomplicated: Secondary | ICD-10-CM | POA: Diagnosis not present

## 2019-05-19 DIAGNOSIS — L97212 Non-pressure chronic ulcer of right calf with fat layer exposed: Secondary | ICD-10-CM | POA: Diagnosis not present

## 2019-05-19 DIAGNOSIS — Z880 Allergy status to penicillin: Secondary | ICD-10-CM | POA: Diagnosis not present

## 2019-05-19 DIAGNOSIS — I110 Hypertensive heart disease with heart failure: Secondary | ICD-10-CM | POA: Diagnosis not present

## 2019-05-19 DIAGNOSIS — I2510 Atherosclerotic heart disease of native coronary artery without angina pectoris: Secondary | ICD-10-CM | POA: Diagnosis not present

## 2019-05-19 DIAGNOSIS — E11622 Type 2 diabetes mellitus with other skin ulcer: Secondary | ICD-10-CM | POA: Diagnosis not present

## 2019-05-19 DIAGNOSIS — I890 Lymphedema, not elsewhere classified: Secondary | ICD-10-CM | POA: Diagnosis not present

## 2019-05-19 NOTE — Progress Notes (Signed)
[name], [name] ([id number]) Visit Report for 05/19/2019 Arrival Information Details Patient Name: [name], [name]. Date of Service: 05/19/2019 11:00 AM Medical Record Number: [id number] Patient Account Number: [id number] Date of Birth/Sex: [birth year] (74 y.o. F) Treating RN: Montey Hora Primary Care Amour Cutrone: Lelon Huh Other Clinician: Referring Zameer Borman: Lelon Huh Treating Nadir Vasques/Extender: Tito Dine in Treatment: 5 Visit Information History Since Last Visit Added or deleted any medications: No Patient Arrived: Wheel Chair Any new allergies or adverse reactions: No Arrival Time: 10:59 Had a fall or experienced change in No Accompanied By: husband activities of daily living that may affect Transfer Assistance: Manual risk of falls: Patient Identification Verified: Yes Signs or symptoms of abuse/neglect since last visito No Secondary Verification Process Completed: Yes Hospitalized since last visit: No Implantable device outside of the clinic excluding No cellular tissue based products placed in the center since last visit: Has Dressing in Place as Prescribed: Yes Has Compression in Place as Prescribed: Yes Pain Present Now: No Notes 98.2 Electronic Signature(s) Signed: 05/19/2019 4:30:43 PM By: Montey Hora Entered By: Montey Hora on 05/19/2019 11:05:52 [name] ([id number]) -------------------------------------------------------------------------------- Compression Therapy Details Patient Name: Jillian Albe B. Date of Service: 05/19/2019 11:00 AM Medical Record Number: [id number] Patient Account Number: [id number] Date of Birth/Sex: 1945/07/21 (74 y.o. F) Treating RN: Montey Hora Primary Care Addelyn Alleman: Lelon Huh Other Clinician: Referring Celenia Hruska: Lelon Huh Treating Alysabeth Scalia/Extender: Tito Dine in Treatment: 5 Compression Therapy Performed for Wound Assessment: Wound #14 Right,Anterior Lower Leg Performed By:  Clinician Montey Hora, RN Compression Type: Three Layer Electronic Signature(s) Signed: 05/19/2019 11:44:17 AM By: Montey Hora Entered By: Montey Hora on 05/19/2019 11:44:17 [name] ([id number]) -------------------------------------------------------------------------------- Compression Therapy Details Patient Name: [name], Jillian B. Date of Service: 05/19/2019 11:00 AM Medical Record Number: [id number] Patient Account Number: [id number] Date of Birth/Sex: [birth year] (74 y.o. F) Treating RN: Montey Hora Primary Care Antara Brecheisen: Lelon Huh Other Clinician: Referring Azucena Dart: Lelon Huh Treating Vasilia Dise/Extender: Tito Dine in Treatment: 5 Compression Therapy Performed for Wound Assessment: Wound #17 Left,Anterior Lower Leg Performed By: Clinician Montey Hora, RN Compression Type: Three Layer Electronic Signature(s) Signed: 05/19/2019 11:44:18 AM By: Montey Hora Entered By: Montey Hora on 05/19/2019 11:44:17 [name] ([id number]) -------------------------------------------------------------------------------- Encounter Discharge Information Details Patient Name: [name], Jillian B. Date of Service: 05/19/2019 11:00 AM Medical Record Number: [id number] Patient Account Number: [id number] Date of Birth/Sex: 16-Sep-1945 (74 y.o. F) Treating RN: Montey Hora Primary Care Jermell Holeman: Lelon Huh Other Clinician: Referring Blaize Epple: Lelon Huh Treating Lyzette Reinhardt/Extender: Tito Dine in Treatment: 5 Encounter Discharge Information Items Discharge Condition: Stable Ambulatory Status: Wheelchair Discharge Destination: Home Transportation: Private Auto Accompanied By: spouse Schedule Follow-up Appointment: Yes Clinical Summary of Care: Electronic Signature(s) Signed: 05/19/2019 11:46:55 AM By: Montey Hora Entered By: Montey Hora on 05/19/2019 11:46:55 [name]  ([id number]) -------------------------------------------------------------------------------- Wound Assessment Details Patient Name: [name], Jillian B. Date of Service: 05/19/2019 11:00 AM Medical Record Number: [id number] Patient Account Number: [id number] Date of Birth/Sex: 10-14-1945 (74 y.o. F) Treating RN: Montey Hora Primary Care Trixy Loyola: Lelon Huh Other Clinician: Referring Diago Haik: Lelon Huh Treating Timica Marcom/Extender: Ricard Dillon Weeks in Treatment: 5 Wound Status Wound Number: 14 Primary Venous Leg Ulcer Etiology: Wound Location: Right, Anterior Lower Leg Secondary Diabetic Wound/Ulcer of the Lower Extremity Wounding Event: Blister Etiology: Date Acquired: 03/31/2019 Wound Open Weeks Of Treatment: 5 Status: Clustered Wound: Yes Comorbid Lymphedema, Asthma, Congestive Heart Failure, History: Coronary Artery Disease, Hypertension, Type II Diabetes, Osteoarthritis Photos Wound Measurements Length: (  cm) 2.2 Width: (cm) 4 Depth: (cm) 0.1 Clustered Quantity: 2 Area: (cm) 6.912 Volume: (cm) 0.691 % Reduction in Area: 62.1% % Reduction in Volume: 62.2% Epithelialization: Large (67-100%) Tunneling: No Undermining: No Wound Description Classification: Full Thickness Without Exposed Support Structu Wound Margin: Flat and Intact Exudate Amount: Large Exudate Type: Serous Exudate Color: amber res Foul Odor After Cleansing: No Slough/Fibrino Yes Wound Bed Granulation Amount: Large (67-100%) Exposed Structure Granulation Quality: Pink Fascia Exposed: No Necrotic Amount: None Present (0%) Fat Layer (Subcutaneous Tissue) Exposed: Yes Tendon Exposed: No Muscle Exposed: No Joint Exposed: No Bone Exposed: No Treatment Notes Wound #14 (Right, Anterior Lower Leg) Notes [name], Jillian B. ([id number]) scell, xsorb, 3 layer bilateral Electronic Signature(s) Signed: 05/19/2019 4:30:43 PM By: Montey Hora Entered By: Montey Hora on 05/19/2019  11:07:25 [name] ([id number]) -------------------------------------------------------------------------------- Wound Assessment Details Patient Name: Jillian Albe B. Date of Service: 05/19/2019 11:00 AM Medical Record Number: [id number] Patient Account Number: [id number] Date of Birth/Sex: [birth year] (74 y.o. F) Treating RN: Montey Hora Primary Care Bryton Romagnoli: Lelon Huh Other Clinician: Referring Costa Jha: Lelon Huh Treating Ronne Savoia/Extender: Tito Dine in Treatment: 5 Wound Status Wound Number: 17 Primary Venous Leg Ulcer Etiology: Wound Location: Left, Anterior Lower Leg Secondary Diabetic Wound/Ulcer of the Lower Extremity Wounding Event: Blister Etiology: Date Acquired: 03/31/2019 Wound Open Weeks Of Treatment: 5 Status: Clustered Wound: Yes Comorbid Lymphedema, Asthma, Congestive Heart Failure, History: Coronary Artery Disease, Hypertension, Type II Diabetes, Osteoarthritis Photos Wound Measurements Length: (cm) 12 Width: (cm) 22 Depth: (cm) 0.1 Clustered Quantity: 3 Area: (cm) 207.345 Volume: (cm) 20.735 % Reduction in Area: -407.7% % Reduction in Volume: -407.7% Epithelialization: Medium (34-66%) Wound Description Classification: Full Thickness Without Exposed Support Structu Wound Margin: Flat and Intact Exudate Amount: Large Exudate Type: Serous Exudate Color: amber res Foul Odor After Cleansing: No Slough/Fibrino Yes Wound Bed Granulation Amount: Large (67-100%) Exposed Structure Granulation Quality: Pink Fascia Exposed: No Necrotic Amount: Small (1-33%) Fat Layer (Subcutaneous Tissue) Exposed: Yes Necrotic Quality: Adherent Slough Tendon Exposed: No Muscle Exposed: No Joint Exposed: No Bone Exposed: No Treatment Notes Wound #17 (Left, Anterior Lower Leg) Notes [name], Jillian B. ([id number]) scell, xsorb, 3 layer bilateral Electronic Signature(s) Signed: 05/19/2019 4:30:43 PM By: Montey Hora Entered By: Montey Hora on 05/19/2019 11:07:47

## 2019-05-21 ENCOUNTER — Telehealth: Payer: Self-pay | Admitting: Family Medicine

## 2019-05-21 NOTE — Telephone Encounter (Signed)
I called and advised patient as below. She requested that I call her husband and advise him of this message because he takes care of all her medical  Appointments, and she may not remember to tell him about what I advised her of. I called her husbands phone and left a message to call back. OK for [facility] to advise patient's husband and schedule appointment around the last week in June.

## 2019-05-21 NOTE — Telephone Encounter (Signed)
Patient's husband called and given information below from Dr. Caryn Section. Appointment scheduled for Monday, 06/30/19 at [facility].

## 2019-05-21 NOTE — Telephone Encounter (Signed)
Please check with patient regarding diabetes medications. The metformin was supposed to be discontinued due to worsening kidney functions in March.  We need to have her come back around the last week of June to check up on her kidney functions and A1c.

## 2019-05-23 ENCOUNTER — Other Ambulatory Visit: Payer: Self-pay

## 2019-05-23 ENCOUNTER — Ambulatory Visit (INDEPENDENT_AMBULATORY_CARE_PROVIDER_SITE_OTHER): Payer: Medicare Other | Admitting: Internal Medicine

## 2019-05-23 ENCOUNTER — Encounter: Payer: Self-pay | Admitting: Internal Medicine

## 2019-05-23 VITALS — BP 132/68 | HR 62 | Ht 64.0 in | Wt 206.0 lb

## 2019-05-23 DIAGNOSIS — I350 Nonrheumatic aortic (valve) stenosis: Secondary | ICD-10-CM

## 2019-05-23 DIAGNOSIS — I5022 Chronic systolic (congestive) heart failure: Secondary | ICD-10-CM

## 2019-05-23 DIAGNOSIS — E1122 Type 2 diabetes mellitus with diabetic chronic kidney disease: Secondary | ICD-10-CM

## 2019-05-23 DIAGNOSIS — N189 Chronic kidney disease, unspecified: Secondary | ICD-10-CM

## 2019-05-23 DIAGNOSIS — N179 Acute kidney failure, unspecified: Secondary | ICD-10-CM | POA: Diagnosis not present

## 2019-05-23 DIAGNOSIS — I10 Essential (primary) hypertension: Secondary | ICD-10-CM | POA: Diagnosis not present

## 2019-05-23 DIAGNOSIS — N183 Chronic kidney disease, stage 3 unspecified: Secondary | ICD-10-CM

## 2019-05-23 MED ORDER — FUROSEMIDE 40 MG PO TABS: 40.0000 mg | ORAL_TABLET | Freq: Every day | ORAL | 1 refills | Status: DC

## 2019-05-23 NOTE — Progress Notes (Signed)
Follow-up Outpatient Visit Date: 05/23/2019  Primary Care Provider: Birdie Sons, MD [address] [place] [address]  Chief Complaint: Leg swelling  HPI:  [name] is a 74 y.o. female with history of non-obstructive CAD, NICM with chronic systolic and diastolic heart failure, HTN, DM, and multiple myeloma, who presents for follow-up of HFpEF, who presents for follow-up of leg swelling.  I last saw [name] a month ago, at which time she noted some improvement in her leg swelling.  She denies shortness of breath, chest pain, palpitations, and lightheadedness.  Due to concerns about urinary frequency, we agreed to de-escalate furosemide to 40 mg every other day.  I encouraged her to continue working with the wound care clinic.  Since going to every other day furosemide doses, leg swelling has worsened to the point where weaping is again present.  She continues to follow in the wound care center and receives "triple wraps" on both legs.  Despite this, [name] biggest complaint is urinary frequency, especially when she takes furosemide.  She denies chest pain, shortness of breath, palpitations, and lightheadedness.  She notes intermittent cramping in her legs, which has worsened with increased swelling since our last visit.  She remains very sedentary, unable to due most ADL's without assistance.  [name] has not been advised to restart any medications for her DM by Dr. Caryn Section; we previously held metformin in the setting of AKI superimposed on CKD.  --------------------------------------------------------------------------------------------------  Cardiovascular History & Procedures: Cardiovascular Problems:  Chronic systolic and diastolic heart failure secondary to non-ischemic cardiomyopathy  Nonobstructive coronary artery disease  Aortic stenosis  Risk Factors:  Known coronary artery disease, hypertension, hyperlipidemia, diabetes mellitus, sedentary  lifestyle, obesity, and age > 34  Cath/PCI:  LHC/RHC (03/07/16): LMCA normal. LAD with 30% ostial stenosi, 30%midvessel narrowing, and 50% distal disease. LCx with 40% midvessel lesion. RCA with 30% proximal narrowing. LVEDP 26 mmHg. RA 18 (prominent "M" appearance), RV 82/21, PA mean 55, PCWP 25. Ao sat 96%, PA sat 54%, Fick Co/Ci 3.2/1.7. PVR 9.4 Wood units. No significant AoV gradient.  CV Surgery:  None  EP Procedures and Devices:  None  Non-Invasive Evaluation(s):  TTE (04/18/2019): Normal LV size with mild LVH.  LVEF 60-65% with grade 1 diastolic dysfunction.  Normal RV size and function.  Mild pulmonary hypertension.  Very mild aortic stenosis.  ABI's (07/26/16):Right:ABI notobtained, TBI 1.8. Left:ABI 1.9, TBI 1.0.Triphasic waveforms noted in both lower extremities.  TTE (07/26/16): Normal LV size with moderate LVH. LVEF 55-60% with normal wall motion. Grade 1 diastolic dysfunction. Mild aortic stenosis (mean gradient 11 mmHg). Mitral annular calcification with mild to moderate MR. Mild left atrial enlargement. Normal RV size and function. Normal PA pressure.  TTE (02/25/16): Normal LV size with moderate LVH. LVEF 25-30% with grade 2 diastolic dysfunction. Thickened aortic valve with likely moderate stenosis. MAC with mild MR. Mild LA enlargement. Mildly to moderately reduced RV contraction. Mild-moderate TR. Moderately PH (RVSP 55 mmHg).  Recent CV Pertinent Labs: Lab Results  Component Value Date   CHOL 151 09/27/2018   HDL 48 09/27/2018   LDLCALC 84 09/27/2018   TRIG 105 09/27/2018   CHOLHDL 3.1 09/27/2018   INR 1.0 03/02/2016   BNP 1,592.0 (H) 02/09/2016   K 4.3 05/23/2019   BUN 23 05/23/2019   CREATININE 1.64 (H) 05/23/2019   CREATININE 1.31 (H) 12/21/2016    Past medical and surgical history were reviewed and updated in EPIC.  Current Meds  Medication  Sig  . aspirin 325 MG tablet Take 325 mg by mouth every 6 (six) hours as needed (pain).  Marland Kitchen aspirin EC 81  MG tablet Take 1 tablet (81 mg total) by mouth daily.  . Cholecalciferol (VITAMIN D) 50 MCG (2000 UT) tablet Take 2,000 Units by mouth daily.  . cyanocobalamin 500 MCG tablet Take 250 mcg by mouth daily.   . furosemide (LASIX) 40 MG tablet Take 1 tablet (40 mg total) by mouth daily.  Marland Kitchen HYDROcodone-acetaminophen (NORCO) 10-325 MG tablet Take 1 tablet by mouth every 8 (eight) hours as needed.  Marland Kitchen lisinopril (ZESTRIL) 20 MG tablet Take 20 mg by mouth daily.  . metoprolol tartrate (LOPRESSOR) 50 MG tablet Take 1 tablet (50 mg total) by mouth 2 (two) times daily.  Marland Kitchen oxybutynin (DITROPAN) 5 MG tablet Take 1 tablet (5 mg total) by mouth 2 (two) times daily.  . [DISCONTINUED] furosemide (LASIX) 40 MG tablet Take 1 tablet (40 mg total) by mouth every other day.  . [DISCONTINUED] lovastatin (MEVACOR) 20 MG tablet TAKE 1 TABLET BY MOUTH EVERYDAY AT BEDTIME    Allergies: Hydrochlorothiazide and Penicillins  Social History   Tobacco Use  . Smoking status: Never Smoker  . Smokeless tobacco: Never Used  Substance Use Topics  . Alcohol use: No  . Drug use: No    Family History  Problem Relation Age of Onset  . Cancer Mother        Uterine cancer  . Diabetes Mother   . Heart disease Mother   . Heart failure Mother   . Parkinson's disease Father   . Cancer Sister        breast cancer  . Diabetes Brother        Non-insulin Dependent Diabetes Mellitus  . Mesothelioma Brother   . Hypertension Sister   . Diabetes Sister        Non-insulin dependent Diabetes Mellitus    Review of Systems: A 12-system review of systems was performed and was negative except as noted in the HPI.  --------------------------------------------------------------------------------------------------  Physical Exam: BP 132/68 (BP Location: Right Arm, Patient Position: Sitting, Cuff Size: Normal)   Pulse 62   Ht _0  (1.626 m)   Wt 206 lb (93.4 kg) Comment: Patient estimate-unable to stand on scale  SpO2 98%   BMI  35.36 kg/m   General:  NAD.  Accompanied by her husband. Neck: JVP ~6-8 cm with positive HJR. Lungs: Mildly diminished breath sounds throughout without wheezes or crackles. Heart: RRR with 1/6 systolic murmur.  No rubs or gallops. Abdomen: Obese but soft.  NT/ND. Ext: Both calves wrapped with 1-2+ edema still present.  EKG:  NSR with poor R wave progression, likely due to lead placement.  No significant change since 04/23/19.  Lab Results  Component Value Date   WBC 6.9 03/28/2019   HGB 9.0 (L) 03/28/2019   HCT 28.4 (L) 03/28/2019   MCV 91 03/28/2019   PLT 301 03/28/2019    Lab Results  Component Value Date   NA 147 (H) 05/23/2019   K 4.3 05/23/2019   CL 110 (H) 05/23/2019   CO2 25 05/23/2019   BUN 23 05/23/2019   CREATININE 1.64 (H) 05/23/2019   GLUCOSE 201 (H) 05/23/2019   ALT 6 03/28/2019    Lab Results  Component Value Date   CHOL 151 09/27/2018   HDL 48 09/27/2018   LDLCALC 84 09/27/2018   TRIG 105 09/27/2018   CHOLHDL 3.1 09/27/2018    --------------------------------------------------------------------------------------------------  ASSESSMENT AND  PLAN: Chronic heart failure with recovered ejection fraction: Volume exam challenging due to body habitus and leg wraps.  Pt was unable to stand today to be weighed.  Overall, I am concerned that her volume status has worsened after furosemide was changed to every other day dosing at the patient's request.  I have advised [name] that she will likely require daily dosing of furosemide 40 mg (further escalation may be needed in the future).  We will check a BMP to ensure stable renal function and electrolytes.  Aortic stenosis: Very mild on recent echo.  Continue clinical follow-up.  Acute on chronic renal failure: Recheck BMP today, with plans to increase furosemide to 40 mg daily.  I advised [name] to continue avoiding nephrotoxic agents, particularly NSAIDS.  Leg pain: I suspect this is primarily due to  worsening edema.  However, we have agreed to hold lovastatin until of f/u visit in case there is an element of statin myopathy.  Type 2 diabetes mellitus: Medications yet to be restarted following discontinuation of metformin in the setting of worsening renal function.  I asked [name] to reach out to Dr. Caryn Section for further guidance.  Follow-up: Return to clinic in 1 month.  Nelva Bush, MD 05/24/2019 2:13 PM

## 2019-05-23 NOTE — Patient Instructions (Signed)
Medication Instructions:  Your physician has recommended you make the following change in your medication:  1- STOP Lovastatin. 2- CHANGE Furosemide to 40 mg by mouth once a day.  *If you need a refill on your cardiac medications before your next appointment, please call your pharmacy*   Lab Work: Your physician recommends that you return for lab work in: TODAY - BMP, Total CK.  If you have labs (blood work) drawn today and your tests are completely normal, you will receive your results only by: Marland Kitchen MyChart Message (if you have MyChart) OR . A paper copy in the mail If you have any lab test that is abnormal or we need to change your treatment, we will call you to review the results.   Testing/Procedures: none   Follow-Up: At [facility], you and your health needs are our priority.  As part of our continuing mission to provide you with exceptional heart care, we have created designated Provider Care Teams.  These Care Teams include your primary Cardiologist (physician) and Advanced Practice Providers (APPs -  Physician Assistants and Nurse Practitioners) who all work together to provide you with the care you need, when you need it.  We recommend signing up for the patient portal called "MyChart".  Sign up information is provided on this After Visit Summary.  MyChart is used to connect with patients for Virtual Visits (Telemedicine).  Patients are able to view lab/test results, encounter notes, upcoming appointments, etc.  Non-urgent messages can be sent to your provider as well.   To learn more about what you can do with MyChart, go to [email or web address].    Your next appointment:   1 month(s)  The format for your next appointment:   In Person  Provider:    You may see Nelva Bush, MD or one of the following Advanced Practice Providers on your designated Care Team:    Murray Hodgkins, NP  Christell Faith, PA-C  Marrianne Mood, PA-C

## 2019-05-24 ENCOUNTER — Encounter: Payer: Self-pay | Admitting: Internal Medicine

## 2019-05-24 LAB — BASIC METABOLIC PANEL
BUN/Creatinine Ratio: 14 (ref 12–28)
BUN: 23 mg/dL (ref 8–27)
CO2: 25 mmol/L (ref 20–29)
Calcium: 9.1 mg/dL (ref 8.7–10.3)
Chloride: 110 mmol/L — ABNORMAL HIGH (ref 96–106)
Creatinine, Ser: 1.64 mg/dL — ABNORMAL HIGH (ref 0.57–1.00)
GFR calc Af Amer: 35 mL/min/{1.73_m2} — ABNORMAL LOW (ref >59)
GFR calc non Af Amer: 30 mL/min/{1.73_m2} — ABNORMAL LOW (ref >59)
Glucose: 201 mg/dL — ABNORMAL HIGH (ref 65–99)
Potassium: 4.3 mmol/L (ref 3.5–5.2)
Sodium: 147 mmol/L — ABNORMAL HIGH (ref 134–144)

## 2019-05-24 LAB — CK TOTAL AND CKMB (NOT AT ARMC)
CK-MB Index: 1.4 ng/mL (ref 0.0–5.3)
Total CK: 37 U/L (ref 32–182)

## 2019-05-26 ENCOUNTER — Other Ambulatory Visit
Admission: RE | Admit: 2019-05-26 | Discharge: 2019-05-26 | Disposition: A | Discharge status: Home / Self Care | Payer: Medicare Other | Source: Ambulatory Visit | Attending: Physician Assistant | Admitting: Physician Assistant

## 2019-05-26 ENCOUNTER — Other Ambulatory Visit: Payer: Self-pay

## 2019-05-26 ENCOUNTER — Encounter: Payer: Medicare Other | Admitting: Physician Assistant

## 2019-05-26 DIAGNOSIS — Z881 Allergy status to other antibiotic agents status: Secondary | ICD-10-CM | POA: Diagnosis not present

## 2019-05-26 DIAGNOSIS — E11622 Type 2 diabetes mellitus with other skin ulcer: Secondary | ICD-10-CM | POA: Diagnosis not present

## 2019-05-26 DIAGNOSIS — E785 Hyperlipidemia, unspecified: Secondary | ICD-10-CM | POA: Diagnosis not present

## 2019-05-26 DIAGNOSIS — I2510 Atherosclerotic heart disease of native coronary artery without angina pectoris: Secondary | ICD-10-CM | POA: Diagnosis not present

## 2019-05-26 DIAGNOSIS — M1990 Unspecified osteoarthritis, unspecified site: Secondary | ICD-10-CM | POA: Diagnosis not present

## 2019-05-26 DIAGNOSIS — L97212 Non-pressure chronic ulcer of right calf with fat layer exposed: Secondary | ICD-10-CM | POA: Diagnosis not present

## 2019-05-26 DIAGNOSIS — I272 Pulmonary hypertension, unspecified: Secondary | ICD-10-CM | POA: Diagnosis not present

## 2019-05-26 DIAGNOSIS — I890 Lymphedema, not elsewhere classified: Secondary | ICD-10-CM | POA: Diagnosis not present

## 2019-05-26 DIAGNOSIS — J45909 Unspecified asthma, uncomplicated: Secondary | ICD-10-CM | POA: Diagnosis not present

## 2019-05-26 DIAGNOSIS — L97222 Non-pressure chronic ulcer of left calf with fat layer exposed: Secondary | ICD-10-CM | POA: Diagnosis not present

## 2019-05-26 DIAGNOSIS — I509 Heart failure, unspecified: Secondary | ICD-10-CM | POA: Diagnosis not present

## 2019-05-26 DIAGNOSIS — Z880 Allergy status to penicillin: Secondary | ICD-10-CM | POA: Diagnosis not present

## 2019-05-26 DIAGNOSIS — I110 Hypertensive heart disease with heart failure: Secondary | ICD-10-CM | POA: Diagnosis not present

## 2019-05-26 DIAGNOSIS — L03115 Cellulitis of right lower limb: Secondary | ICD-10-CM | POA: Insufficient documentation

## 2019-05-26 DIAGNOSIS — I428 Other cardiomyopathies: Secondary | ICD-10-CM | POA: Diagnosis not present

## 2019-05-26 NOTE — Progress Notes (Addendum)
ORIE, CUTTINO ([id number]) Visit Report for 05/26/2019 Chief Complaint Document Details Patient Name: [name], [name]. Date of Service: 05/26/2019 10:45 AM Medical Record Number: [id number] Patient Account Number: [id number] Date of Birth/Sex: 02/28/1945 (74 y.o. F) Treating RN: Army Melia Primary Care Provider: Lelon Huh Other Clinician: Referring Provider: Lelon Huh Treating Provider/Extender: Melburn Hake, Krysia Zahradnik Weeks in Treatment: 6 Information Obtained from: Patient Chief Complaint Patient seen for complaints of Non-Healing Wounds to both lower extremities Electronic Signature(s) Signed: 05/26/2019 11:02:06 AM By: Worthy Keeler PA-C Entered By: Worthy Keeler on 05/26/2019 11:02:05 [name] ([id number]) -------------------------------------------------------------------------------- HPI Details Patient Name: [name]. Date of Service: 05/26/2019 10:45 AM Medical Record Number: [id number] Patient Account Number: [id number] Date of Birth/Sex: 01-21-1945 (74 y.o. F) Treating RN: Army Melia Primary Care Provider: Lelon Huh Other Clinician: Referring Provider: Lelon Huh Treating Provider/Extender: Melburn Hake, Brailynn Breth Weeks in Treatment: 6 History of Present Illness Location: both lower extremity swelling with ulceration HPI Description: 74 year old patient who sees her PCP Dr. Lelon Huh was recently evaluated 10 days ago for diabetes mellitus, hypertension, CHF and hyperlipidemia. she also was noted to have ulcerations develop in her legs and she has been applying Silvadene dressings locally. In the past she has refused wound care referrals.her cardiologist Dr. Saunders Revel saw her and put her on 40 mg of furosemide daily. last hemoglobin A1c was 7.7%. she was also placed on ciprofloxacin twice daily for 7 days and a urine culture was recommended. past medical history significant for coronary artery disease, diabetes mellitus, nonischemic cardiomyopathy and  pulmonary hypertension. She is also status post heart catheterization and coronary angiography, tubal ligation and breast cyst removal in the past. She has never been a smoker. Patient had arterial studies done which showed bilateral ABIs are artificially elevated due to noncompressible and calcified vessels. Triphasic waveform throughout. Right great toe TBI is elevated while the left is normal. 10/23/2016 -- the patient is rather moribund from several issues including chronic back pain and knee pain and swelling of her legs. The large necrotic area on her left lateral anterior calf was bleeding on touch after washing her leg. There was a spot which needed silver nitrate cauterization and this was done appropriately. 11/06/2016 -- she is again noted to have friable bleeding from the left lower extremity wounds and this again had to be cauterized with silver nitrate to control the bleeding as pressure itself would not do it. 11/20/2016 -- the right lower extremity is completely healed and we have ordered 30-40 mm compression stocking's in both the dural layer and also a pair of juxta lites. 12/18/2016 -- she has not been wearing her compression stockings on her right leg and these have opened out into ulcerations again. Her left lower extremity has circumferential ulcerations now. I believe at this stage I would like to get her venous reflux studies done to make certain that there is no fixable superficial venous reflux. 12/29/2016 -- she has made an excellent recovery having continued with her appropriate doses of diuretics and elevation and exercise. She has not yet received her right lower extremity juxta lites. Her venous duplex study is at the end of January 01/13/16 on evaluation today patient's wounds appeared to be overall doing much better much less hyper granular than previous week's evaluation. The Hydrofera Blue Dressing's to be doing very well. She does tell me that she is having a  little bit more discomfort in the posterior aspect of her leg where she has a wound  at this point. Fortunately there appears to be no infection. 01/19/17 on evaluation today patient appears to be doing very well in regard to her bilateral lower extremity swelling and at this point in time her left lower extremity ulcers. Her wounds appear to be doing much better. She has been tolerating the dressing's at this point fortunately she did get the Juxta-Lite compression as of today as well. Nonetheless I am pleased that she has been tolerating everything so well and that her wounds looks so good. In fact she is an excellent granular surface no evidence of slough covering and I do not see any reason for likely debridement today. Especially in regard to the posterior leg. 01/26/17 on evaluation today patient appears to be doing decently well in regard to her wounds. The surface of the majority of her wounds is greatly improved. She has been using the Juxta-Lite compression which is excellent. Nonetheless the wound of the left posterior calf did require some debridement today otherwise the majority of the wounds did not require any debridement. The this was due to slough buildup on the surface. She also has a small skin flap where two of the ulcers actually connected and this has loosened up I'm concerned that things may not heal well with that flap but for the time being I'm not gonna do anything different in that regard. 02/23/17 on evaluation today patient's wound actually appears to be doing fairly well in regard to the remaining left lateral ulcer which we have been treated. With that being said it does appear that her wrap actually slid down the wood bit over the past few days at least that there's actually some friction injury and blistering noted on the medial aspect of her malleolus of the left lower extremity. She continues to use the Juxta- Lite for the right lower extremity. With that being said no  debridement is necessary today and these are very superficial injuries as far as the new injuries are concerned. 03/02/17 on evaluation today patient appears to be doing excellent in regard to her left lateral lower from the ulcers. In fact it appears that she is almost completely healed in regard to the ulcer we have been following and the new injuries which were caused by the wraps slipping down last week have been completely resolved which is excellent news. Overall I'm definitely pleased with the progress that she has made. Fortunately the rat did not cause any irritation as it did previously with the Unna wrap. 03/09/17 on evaluation today patient's wound appears to be completely healed. Obviously this is great news. She and her husband both are extremely pleased and excited to finally have this gone. Obviously she has been dealing with this wound for quite some time. Readmission: 04/14/2019 upon evaluation today patient appears for reevaluation here in our clinic although it has been since March 2019 since I last saw her. She unfortunately is having issues similar to what she had previous which are blisters and ulcerations of the bilateral lower extremities. Fortunately there is no evidence of active infection at this time based on what I am seeing. Unfortunately she is still having a lot of drainage and weeping. I do believe she needs to be compression wrap in order to get this under control and she is previously tolerated a 3 layer compression wrap without any complications whatsoever. No fevers, chills, nausea, vomiting, or diarrhea. [name], [name] ([id number]) 04/28/2019 upon evaluation today patient appears to be doing well with regard to the  bilateral lower extremities at this time. She has been tolerating the dressing changes without complication. Fortunately there is no signs of active infection at this time. No fevers, chills, nausea, vomiting, or diarrhea. 05/05/2019 upon evaluation today  patient appears to be doing excellent in regard to her legs. She has a lot of new skin growth and overall seems to be progressing quite nicely. Fortunately there is no signs of active infection at this time. No fevers, chills, nausea, vomiting, or diarrhea. 05/12/2019 upon evaluation today patient appears to be doing very well with regard to her lower extremities. She is not completely healed at this point but nonetheless he is doing much better even compared to last week. I am very pleased with where things stand. No fevers, chills, nausea, vomiting, or diarrhea. 05/26/2019 upon evaluation today patient actually appears to be doing worse with regard to her lower extremities. She has green drainage unfortunately is having more pain on the right lower extremity as well. Fortunately there is no signs of active infection systemically which is good news. Electronic Signature(s) Signed: 05/26/2019 12:03:29 PM By: Worthy Keeler PA-C Entered By: Worthy Keeler on 05/26/2019 12:03:28 [name] ([id number]) -------------------------------------------------------------------------------- Physical Exam Details Patient Name: TAKOYA, JONAS B. Date of Service: 05/26/2019 10:45 AM Medical Record Number: [id number] Patient Account Number: [id number] Date of Birth/Sex: 1945-12-27 (74 y.o. F) Treating RN: Army Melia Primary Care Provider: Lelon Huh Other Clinician: Referring Provider: Lelon Huh Treating Provider/Extender: Melburn Hake, Xylia Scherger Weeks in Treatment: 6 Constitutional Well-nourished and well-hydrated in no acute distress. Respiratory normal breathing without difficulty. Cardiovascular 1+ pitting edema of the bilateral lower extremities. Psychiatric this patient is able to make decisions and demonstrates good insight into disease process. Alert and Oriented x 3. pleasant and cooperative. Notes Upon inspection patient did have increased overall sizes of her wounds. She also did  unfortunately have issues at this point with pain on the right lower extremity which unfortunately I think is secondary to the fact that the patient is infected. Electronic Signature(s) Signed: 05/26/2019 12:06:03 PM By: Worthy Keeler PA-C Entered By: Worthy Keeler on 05/26/2019 12:06:03 [name] ([id number]) -------------------------------------------------------------------------------- Physician Orders Details Patient Name: [name], Jillian B. Date of Service: 05/26/2019 10:45 AM Medical Record Number: [id number] Patient Account Number: [id number] Date of Birth/Sex: 07-20-1945 (74 y.o. F) Treating RN: Army Melia Primary Care Provider: Lelon Huh Other Clinician: Referring Provider: Lelon Huh Treating Provider/Extender: Melburn Hake, Damonta Cossey Weeks in Treatment: 6 Verbal / Phone Orders: No Diagnosis Coding ICD-10 Coding Code Description E11.622 Type 2 diabetes mellitus with other skin ulcer I89.0 Lymphedema, not elsewhere classified L97.212 Non-pressure chronic ulcer of right calf with fat layer exposed L97.222 Non-pressure chronic ulcer of left calf with fat layer exposed Wound Cleansing Wound #14 Right,Anterior Lower Leg o Dial antibacterial soap, wash wounds, rinse and pat dry prior to dressing wounds - HH nurse and in office o May shower with protection. - Do not get wraps wet Wound #17 Left,Circumferential Lower Leg o Dial antibacterial soap, wash wounds, rinse and pat dry prior to dressing wounds - HH nurse and in office o May shower with protection. - Do not get wraps wet Primary Wound Dressing Wound #14 Right,Anterior Lower Leg o Silver Alginate Wound #17 Left,Circumferential Lower Leg o Silver Alginate Secondary Dressing Wound #14 Right,Anterior Lower Leg o XtraSorb Wound #17 Left,Circumferential Lower Leg o XtraSorb Dressing Change Frequency Wound #14 Right,Anterior Lower Leg o Change dressing every week Wound #17 Left,Circumferential  Lower Leg o Change  dressing every week Follow-up Appointments Wound #14 Right,Anterior Lower Leg o Return Appointment in 1 week. o Nurse Visit as needed - thursday 5/27 Wound #17 Left,Circumferential Lower Leg o Return Appointment in 1 week. o Nurse Visit as needed - thursday 5/27 Edema Control Wound #14 Right,Anterior Lower Leg o 3 Layer Compression System - Bilateral o Elevate legs to the level of the heart and pump ankles as often as possible Wound #17 Left,Circumferential Lower Leg o 3 Layer Compression System - Bilateral [name], [name]. ([id number]) o Elevate legs to the level of the heart and pump ankles as often as possible Laboratory o Bacteria identified in Wound by Culture (MICRO) - right leg oooo LOINC Code: 7408-1 oooo Convenience Name: Wound culture routine Patient Medications Allergies: penicillin, hydrochlorothiazide Notifications Medication Indication Start End Levaquin 05/26/2019 DOSE 1 - oral 500 mg tablet - 1 tablet oral taken 1 time per day for 14 days. Do not take Trazadone with this medication Electronic Signature(s) Signed: 05/26/2019 12:09:37 PM By: Worthy Keeler PA-C Entered By: Worthy Keeler on 05/26/2019 12:09:36 [name] ([id number]) -------------------------------------------------------------------------------- Problem List Details Patient Name: [name], [name]. Date of Service: 05/26/2019 10:45 AM Medical Record Number: [id number] Patient Account Number: [id number] Date of Birth/Sex: 10-15-1945 (74 y.o. F) Treating RN: Army Melia Primary Care Provider: Lelon Huh Other Clinician: Referring Provider: Lelon Huh Treating Provider/Extender: Melburn Hake, Cloys Vera Weeks in Treatment: 6 Active Problems ICD-10 Encounter Code Description Active Date MDM Diagnosis E11.622 Type 2 diabetes mellitus with other skin ulcer 04/14/2019 No Yes I89.0 Lymphedema, not elsewhere classified 04/14/2019 No Yes L97.212 Non-pressure  chronic ulcer of right calf with fat layer exposed 04/14/2019 No Yes L97.222 Non-pressure chronic ulcer of left calf with fat layer exposed 04/14/2019 No Yes Inactive Problems Resolved Problems Electronic Signature(s) Signed: 05/26/2019 11:01:59 AM By: Worthy Keeler PA-C Entered By: Worthy Keeler on 05/26/2019 11:01:58 [name] ([id number]) -------------------------------------------------------------------------------- Progress Note Details Patient Name: Jillian Albe B. Date of Service: 05/26/2019 10:45 AM Medical Record Number: [id number] Patient Account Number: [id number] Date of Birth/Sex: Aug 06, 1945 (74 y.o. F) Treating RN: Army Melia Primary Care Provider: Lelon Huh Other Clinician: Referring Provider: Lelon Huh Treating Provider/Extender: Melburn Hake, Joas Motton Weeks in Treatment: 6 Subjective Chief Complaint Information obtained from Patient Patient seen for complaints of Non-Healing Wounds to both lower extremities History of Present Illness (HPI) The following HPI elements were documented for the patient's wound: Location: both lower extremity swelling with ulceration 74 year old patient who sees her PCP Dr. Lelon Huh was recently evaluated 10 days ago for diabetes mellitus, hypertension, CHF and hyperlipidemia. she also was noted to have ulcerations develop in her legs and she has been applying Silvadene dressings locally. In the past she has refused wound care referrals.her cardiologist Dr. Saunders Revel saw her and put her on 40 mg of furosemide daily. last hemoglobin A1c was 7.7%. she was also placed on ciprofloxacin twice daily for 7 days and a urine culture was recommended. past medical history significant for coronary artery disease, diabetes mellitus, nonischemic cardiomyopathy and pulmonary hypertension. She is also status post heart catheterization and coronary angiography, tubal ligation and breast cyst removal in the past. She has never been a smoker. Patient  had arterial studies done which showed bilateral ABIs are artificially elevated due to noncompressible and calcified vessels. Triphasic waveform throughout. Right great toe TBI is elevated while the left is normal. 10/23/2016 -- the patient is rather moribund from several issues including chronic back pain and knee pain and swelling  of her legs. The large necrotic area on her left lateral anterior calf was bleeding on touch after washing her leg. There was a spot which needed silver nitrate cauterization and this was done appropriately. 11/06/2016 -- she is again noted to have friable bleeding from the left lower extremity wounds and this again had to be cauterized with silver nitrate to control the bleeding as pressure itself would not do it. 11/20/2016 -- the right lower extremity is completely healed and we have ordered 30-40 mm compression stocking's in both the dural layer and also a pair of juxta lites. 12/18/2016 -- she has not been wearing her compression stockings on her right leg and these have opened out into ulcerations again. Her left lower extremity has circumferential ulcerations now. I believe at this stage I would like to get her venous reflux studies done to make certain that there is no fixable superficial venous reflux. 12/29/2016 -- she has made an excellent recovery having continued with her appropriate doses of diuretics and elevation and exercise. She has not yet received her right lower extremity juxta lites. Her venous duplex study is at the end of January 01/13/16 on evaluation today patient's wounds appeared to be overall doing much better much less hyper granular than previous week's evaluation. The Hydrofera Blue Dressing's to be doing very well. She does tell me that she is having a little bit more discomfort in the posterior aspect of her leg where she has a wound at this point. Fortunately there appears to be no infection. 01/19/17 on evaluation today patient appears  to be doing very well in regard to her bilateral lower extremity swelling and at this point in time her left lower extremity ulcers. Her wounds appear to be doing much better. She has been tolerating the dressing's at this point fortunately she did get the Juxta-Lite compression as of today as well. Nonetheless I am pleased that she has been tolerating everything so well and that her wounds looks so good. In fact she is an excellent granular surface no evidence of slough covering and I do not see any reason for likely debridement today. Especially in regard to the posterior leg. 01/26/17 on evaluation today patient appears to be doing decently well in regard to her wounds. The surface of the majority of her wounds is greatly improved. She has been using the Juxta-Lite compression which is excellent. Nonetheless the wound of the left posterior calf did require some debridement today otherwise the majority of the wounds did not require any debridement. The this was due to slough buildup on the surface. She also has a small skin flap where two of the ulcers actually connected and this has loosened up I'm concerned that things may not heal well with that flap but for the time being I'm not gonna do anything different in that regard. 02/23/17 on evaluation today patient's wound actually appears to be doing fairly well in regard to the remaining left lateral ulcer which we have been treated. With that being said it does appear that her wrap actually slid down the wood bit over the past few days at least that there's actually some friction injury and blistering noted on the medial aspect of her malleolus of the left lower extremity. She continues to use the Juxta- Lite for the right lower extremity. With that being said no debridement is necessary today and these are very superficial injuries as far as the new injuries are concerned. 03/02/17 on evaluation today patient appears to  be doing excellent in regard to  her left lateral lower from the ulcers. In fact it appears that she is almost completely healed in regard to the ulcer we have been following and the new injuries which were caused by the wraps slipping down last week have been completely resolved which is excellent news. Overall I'm definitely pleased with the progress that she has made. Fortunately the rat did not cause any irritation as it did previously with the Unna wrap. 03/09/17 on evaluation today patient's wound appears to be completely healed. Obviously this is great news. She and her husband both are extremely pleased and excited to finally have this gone. Obviously she has been dealing with this wound for quite some time. Readmission: [name], [name] ([id number]) 04/14/2019 upon evaluation today patient appears for reevaluation here in our clinic although it has been since March 2019 since I last saw her. She unfortunately is having issues similar to what she had previous which are blisters and ulcerations of the bilateral lower extremities. Fortunately there is no evidence of active infection at this time based on what I am seeing. Unfortunately she is still having a lot of drainage and weeping. I do believe she needs to be compression wrap in order to get this under control and she is previously tolerated a 3 layer compression wrap without any complications whatsoever. No fevers, chills, nausea, vomiting, or diarrhea. 04/28/2019 upon evaluation today patient appears to be doing well with regard to the bilateral lower extremities at this time. She has been tolerating the dressing changes without complication. Fortunately there is no signs of active infection at this time. No fevers, chills, nausea, vomiting, or diarrhea. 05/05/2019 upon evaluation today patient appears to be doing excellent in regard to her legs. She has a lot of new skin growth and overall seems to be progressing quite nicely. Fortunately there is no signs of active infection  at this time. No fevers, chills, nausea, vomiting, or diarrhea. 05/12/2019 upon evaluation today patient appears to be doing very well with regard to her lower extremities. She is not completely healed at this point but nonetheless he is doing much better even compared to last week. I am very pleased with where things stand. No fevers, chills, nausea, vomiting, or diarrhea. 05/26/2019 upon evaluation today patient actually appears to be doing worse with regard to her lower extremities. She has green drainage unfortunately is having more pain on the right lower extremity as well. Fortunately there is no signs of active infection systemically which is good news. Objective Constitutional Well-nourished and well-hydrated in no acute distress. Vitals Time Taken: 11:22 AM, Height: 64 in, Weight: 206 lbs, BMI: 35.4, Temperature: 97.9 F, Pulse: 73 bpm, Respiratory Rate: 16 breaths/min, Blood Pressure: 127/99 mmHg. Respiratory normal breathing without difficulty. Cardiovascular 1+ pitting edema of the bilateral lower extremities. Psychiatric this patient is able to make decisions and demonstrates good insight into disease process. Alert and Oriented x 3. pleasant and cooperative. General Notes: Upon inspection patient did have increased overall sizes of her wounds. She also did unfortunately have issues at this point with pain on the right lower extremity which unfortunately I think is secondary to the fact that the patient is infected. Integumentary (Hair, Skin) Wound #14 status is Open. Original cause of wound was Blister. The wound is located on the Right,Anterior Lower Leg. The wound measures 11cm length x 14cm width x 0.1cm depth; 120.951cm^2 area and 12.095cm^3 volume. There is Fat Layer (Subcutaneous Tissue) Exposed exposed. There is  a large amount of serous drainage noted. The wound margin is flat and intact. There is large (67-100%) pink granulation within the wound bed. There is no necrotic  tissue within the wound bed. Wound #17 status is Open. Original cause of wound was Blister. The wound is located on the Left,Circumferential Lower Leg. The wound measures 12cm length x 32cm width x 0.1cm depth; 301.593cm^2 area and 30.159cm^3 volume. There is Fat Layer (Subcutaneous Tissue) Exposed exposed. There is a large amount of serous drainage noted. The wound margin is flat and intact. There is large (67-100%) pink granulation within the wound bed. There is a small (1-33%) amount of necrotic tissue within the wound bed including Adherent Slough. Assessment Active Problems ICD-10 Type 2 diabetes mellitus with other skin ulcer Lymphedema, not elsewhere classified Non-pressure chronic ulcer of right calf with fat layer exposed Non-pressure chronic ulcer of left calf with fat layer exposed [name], Jillian B. ([id number]) Procedures Wound #14 Pre-procedure diagnosis of Wound #14 is a Venous Leg Ulcer located on the Right,Anterior Lower Leg . There was a Three Layer Compression Therapy Procedure by Army Melia, RN. Post procedure Diagnosis Wound #14: Same as Pre-Procedure Wound #17 Pre-procedure diagnosis of Wound #17 is a Venous Leg Ulcer located on the Left,Circumferential Lower Leg . There was a Three Layer Compression Therapy Procedure by Army Melia, RN. Post procedure Diagnosis Wound #17: Same as Pre-Procedure Plan Wound Cleansing: Wound #14 Right,Anterior Lower Leg: Dial antibacterial soap, wash wounds, rinse and pat dry prior to dressing wounds - HH nurse and in office May shower with protection. - Do not get wraps wet Wound #17 Left,Circumferential Lower Leg: Dial antibacterial soap, wash wounds, rinse and pat dry prior to dressing wounds - HH nurse and in office May shower with protection. - Do not get wraps wet Primary Wound Dressing: Wound #14 Right,Anterior Lower Leg: Silver Alginate Wound #17 Left,Circumferential Lower Leg: Silver Alginate Secondary Dressing: Wound  #14 Right,Anterior Lower Leg: XtraSorb Wound #17 Left,Circumferential Lower Leg: XtraSorb Dressing Change Frequency: Wound #14 Right,Anterior Lower Leg: Change dressing every week Wound #17 Left,Circumferential Lower Leg: Change dressing every week Follow-up Appointments: Wound #14 Right,Anterior Lower Leg: Return Appointment in 1 week. Nurse Visit as needed - thursday 5/27 Wound #17 Left,Circumferential Lower Leg: Return Appointment in 1 week. Nurse Visit as needed - thursday 5/27 Edema Control: Wound #14 Right,Anterior Lower Leg: 3 Layer Compression System - Bilateral Elevate legs to the level of the heart and pump ankles as often as possible Wound #17 Left,Circumferential Lower Leg: 3 Layer Compression System - Bilateral Elevate legs to the level of the heart and pump ankles as often as possible Laboratory ordered were: Wound culture routine - right leg The following medication(s) was prescribed: Levaquin oral 500 mg tablet 1 1 tablet oral taken 1 time per day for 14 days. Do not take Trazadone with this medication starting 05/26/2019 1. My suggestion currently is good to be that we go ahead and initiate treatment with Levaquin. The only interaction I found with this medication was with trazodone but the patient does not take trazodone except for on an as-needed basis and right now is not taking it at all she has not for some time according to the patient and her husband. This will most likely cover for likely organisms including Pseudomonas which is one concern of mine. 2. I am also can recommend at this time that the patient go ahead and continue with the compression wraps I do think this is still beneficial for  her we have been doing a 3 layer compression wrap but it does not need to stay on all week. For that reason we will have her come back for nurse visit on 05/29/2019. 3. I am also can recommend currently that we go ahead and elevate her legs as much as possible to try to  keep edema under good control. [name], [name] ([id number]) We will see patient back for reevaluation in 1 week here in the clinic. If anything worsens or changes patient will contact our office for additional recommendations. Electronic Signature(s) Signed: 05/26/2019 12:10:52 PM By: Worthy Keeler PA-C Entered By: Worthy Keeler on 05/26/2019 12:10:51 [name] ([id number]) -------------------------------------------------------------------------------- SuperBill Details Patient Name: [name] Date of Service: 05/26/2019 Medical Record Number: [id number] Patient Account Number: [id number] Date of Birth/Sex: 03/20/1945 (74 y.o. 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 Physician Procedures CPT4 Code: 6384665 Description: 99357 - WC PHYS LEVEL 4 - EST PT Modifier: Quantity: 1 CPT4 Code: Description: ICD-10 Diagnosis Description E11.622 Type 2 diabetes mellitus with other skin ulcer I89.0 Lymphedema, not elsewhere classified L97.212 Non-pressure chronic ulcer of right calf with fat layer exposed L97.222 Non-pressure chronic ulcer of left  calf with fat layer exposed Modifier: Quantity: Electronic Signature(s) Signed: 05/26/2019 12:11:06 PM By: Worthy Keeler PA-C Entered By: Worthy Keeler on 05/26/2019 12:11:05

## 2019-05-29 DIAGNOSIS — I110 Hypertensive heart disease with heart failure: Secondary | ICD-10-CM | POA: Diagnosis not present

## 2019-05-29 DIAGNOSIS — I509 Heart failure, unspecified: Secondary | ICD-10-CM | POA: Diagnosis not present

## 2019-05-29 DIAGNOSIS — I890 Lymphedema, not elsewhere classified: Secondary | ICD-10-CM | POA: Diagnosis not present

## 2019-05-29 DIAGNOSIS — L97222 Non-pressure chronic ulcer of left calf with fat layer exposed: Secondary | ICD-10-CM | POA: Diagnosis not present

## 2019-05-29 DIAGNOSIS — I428 Other cardiomyopathies: Secondary | ICD-10-CM | POA: Diagnosis not present

## 2019-05-29 DIAGNOSIS — E785 Hyperlipidemia, unspecified: Secondary | ICD-10-CM | POA: Diagnosis not present

## 2019-05-29 DIAGNOSIS — J45909 Unspecified asthma, uncomplicated: Secondary | ICD-10-CM | POA: Diagnosis not present

## 2019-05-29 DIAGNOSIS — E11622 Type 2 diabetes mellitus with other skin ulcer: Secondary | ICD-10-CM | POA: Diagnosis not present

## 2019-05-29 DIAGNOSIS — Z880 Allergy status to penicillin: Secondary | ICD-10-CM | POA: Diagnosis not present

## 2019-05-29 DIAGNOSIS — I272 Pulmonary hypertension, unspecified: Secondary | ICD-10-CM | POA: Diagnosis not present

## 2019-05-29 DIAGNOSIS — Z881 Allergy status to other antibiotic agents status: Secondary | ICD-10-CM | POA: Diagnosis not present

## 2019-05-29 DIAGNOSIS — I2510 Atherosclerotic heart disease of native coronary artery without angina pectoris: Secondary | ICD-10-CM | POA: Diagnosis not present

## 2019-05-29 DIAGNOSIS — M1990 Unspecified osteoarthritis, unspecified site: Secondary | ICD-10-CM | POA: Diagnosis not present

## 2019-05-29 DIAGNOSIS — L97212 Non-pressure chronic ulcer of right calf with fat layer exposed: Secondary | ICD-10-CM | POA: Diagnosis not present

## 2019-05-30 ENCOUNTER — Other Ambulatory Visit: Payer: Self-pay

## 2019-05-30 LAB — AEROBIC CULTURE W GRAM STAIN (SUPERFICIAL SPECIMEN)

## 2019-05-30 NOTE — Progress Notes (Signed)
BRENLEY, PRIORE ([id number]) Visit Report for 05/29/2019 Arrival Information Details Patient Name: [name], [name]. Date of Service: 05/29/2019 12:30 PM Medical Record Number: [id number] Patient Account Number: [id number] Date of Birth/Sex: Aug 20, 1945 (74 y.o. F) Treating RN: Cornell Barman Primary Care Daivon Rayos: Lelon Huh Other Clinician: Referring Rodgers Likes: Lelon Huh Treating Kahron Kauth/Extender: Melburn Hake, HOYT Weeks in Treatment: 6 Visit Information History Since Last Visit Added or deleted any medications: No Patient Arrived: Wheel Chair Any new allergies or adverse reactions: No Arrival Time: 12:33 Had a fall or experienced change in No Accompanied By: husband activities of daily living that may affect Transfer Assistance: None risk of falls: Patient Identification Verified: Yes Signs or symptoms of abuse/neglect since last visito No Hospitalized since last visit: No Has Dressing in Place as Prescribed: Yes Pain Present Now: No Electronic Signature(s) Signed: 05/30/2019 2:01:12 PM By: Gretta Cool, BSN, RN, CWS, Kim RN, BSN Entered By: Gretta Cool, BSN, RN, CWS, Kim on 05/29/2019 12:33:42 [name] ([id number]) -------------------------------------------------------------------------------- Compression Therapy Details Patient Name: [name], Jillian B. Date of Service: 05/29/2019 12:30 PM Medical Record Number: [id number] Patient Account Number: [id number] Date of Birth/Sex: 1945/10/22 (74 y.o. F) Treating RN: Cornell Barman Primary Care Lelan Cush: Lelon Huh Other Clinician: Referring Janey Petron: Lelon Huh Treating Delrick Dehart/Extender: Melburn Hake, HOYT Weeks in Treatment: 6 Compression Therapy Performed for Wound Assessment: Wound #14 Right,Anterior Lower Leg Performed By: Clinician Cornell Barman, RN Compression Type: Three Hydrologist) Signed: 05/30/2019 2:01:12 PM By: Gretta Cool, BSN, RN, CWS, Kim RN, BSN Entered By: Gretta Cool, BSN, RN, CWS, Kim on 05/29/2019 12:36:25 [name] ([id number]) -------------------------------------------------------------------------------- Compression Therapy Details Patient Name: [name], MCENANEY. Date of Service: 05/29/2019 12:30 PM Medical Record Number: [id number] Patient Account Number: [id number] Date of Birth/Sex: [birth year] (74 y.o. F) Treating RN: Cornell Barman Primary Care Londynn Sonoda: Lelon Huh Other Clinician: Referring Mekel Haverstock: Lelon Huh Treating Naethan Bracewell/Extender: Melburn Hake, HOYT Weeks in Treatment: 6 Compression Therapy Performed for Wound Assessment: Wound #17 Left,Circumferential Lower Leg Performed By: Clinician Cornell Barman, RN Compression Type: Three Hydrologist) Signed: 05/30/2019 2:01:12 PM By: Gretta Cool, BSN, RN, CWS, Kim RN, BSN Entered By: Gretta Cool, BSN, RN, CWS, Kim on 05/29/2019 12:36:25 [name] ([id number]) -------------------------------------------------------------------------------- Encounter Discharge Information Details Patient Name: [name], REICHOW. Date of Service: 05/29/2019 12:30 PM Medical Record Number: [id number] Patient Account Number: [id number] Date of Birth/Sex: [birth year] (74 y.o. F) Treating RN: Cornell Barman Primary Care Ailany Koren: Lelon Huh Other Clinician: Referring Malyia Moro: Lelon Huh Treating Harshitha Fretz/Extender: Melburn Hake, HOYT Weeks in Treatment: 6 Encounter Discharge Information Items Discharge Condition: Stable Ambulatory Status: Wheelchair Discharge Destination: Home Transportation: Private Auto Accompanied By: husband Schedule Follow-up Appointment: Yes Clinical Summary of Care: Electronic Signature(s) Signed: 05/30/2019 2:01:12 PM By: Gretta Cool, BSN, RN, CWS, Kim RN, BSN Entered By: Gretta Cool, BSN, RN, CWS, Kim on 05/29/2019 12:37:00 [name] ([id number]) -------------------------------------------------------------------------------- Wound Assessment Details Patient Name: [name], Jillian B. Date of Service: 05/29/2019 12:30 PM Medical  Record Number: [id number] Patient Account Number: [id number] Date of Birth/Sex: 08/18/1945 (74 y.o. F) Treating RN: Cornell Barman Primary Care Envy Meno: Lelon Huh Other Clinician: Referring Airam Heidecker: Lelon Huh Treating Khallid Pasillas/Extender: Melburn Hake, HOYT Weeks in Treatment: 6 Wound Status Wound Number: 14 Primary Venous Leg Ulcer Etiology: Wound Location: Right, Anterior Lower Leg Secondary Diabetic Wound/Ulcer of the Lower Extremity Wounding Event: Blister Etiology: Date Acquired: 03/31/2019 Wound Open Weeks Of Treatment: 6 Status: Clustered Wound: Yes Comorbid Lymphedema, Asthma, Congestive Heart Failure, History: Coronary Artery Disease, Hypertension, Type II Diabetes, Osteoarthritis Photos Wound Measurements Length: (cm)  3.5 Width: (cm) 2.5 Depth: (cm) 0.1 Clustered Quantity: 2 Area: (cm) 6.872 Volume: (cm) 0.687 % Reduction in Area: 62.4% % Reduction in Volume: 62.4% Epithelialization: Large (67-100%) Wound Description Classification: Full Thickness Without Exposed Support Structu Wound Margin: Flat and Intact Exudate Amount: Large Exudate Type: Serous Exudate Color: amber res Foul Odor After Cleansing: No Slough/Fibrino Yes Wound Bed Granulation Amount: Large (67-100%) Exposed Structure Granulation Quality: Pink Fascia Exposed: No Necrotic Amount: None Present (0%) Fat Layer (Subcutaneous Tissue) Exposed: Yes Tendon Exposed: No Muscle Exposed: No Joint Exposed: No Bone Exposed: No Treatment Notes Wound #14 (Right, Anterior Lower Leg) Notes [name], [name] ([id number]) scell, ABD, 3 layer bilateral Electronic Signature(s) Signed: 05/30/2019 2:01:12 PM By: Gretta Cool, BSN, RN, CWS, Kim RN, BSN Entered By: Gretta Cool, BSN, RN, CWS, Kim on 05/29/2019 12:35:05 [name] ([id number]) -------------------------------------------------------------------------------- Wound Assessment Details Patient Name: [name], Jillian B. Date of Service: 05/29/2019 12:30  PM Medical Record Number: [id number] Patient Account Number: [id number] Date of Birth/Sex: Dec 06, 1945 (74 y.o. F) Treating RN: Cornell Barman Primary Care Lupie Sawa: Lelon Huh Other Clinician: Referring Edrees Valent: Lelon Huh Treating Clark Cuff/Extender: Melburn Hake, HOYT Weeks in Treatment: 6 Wound Status Wound Number: 17 Primary Venous Leg Ulcer Etiology: Wound Location: Left, Circumferential Lower Leg Secondary Diabetic Wound/Ulcer of the Lower Extremity Wounding Event: Blister Etiology: Date Acquired: 03/31/2019 Wound Open Weeks Of Treatment: 6 Status: Clustered Wound: Yes Comorbid Lymphedema, Asthma, Congestive Heart Failure, History: Coronary Artery Disease, Hypertension, Type II Diabetes, Osteoarthritis Photos Wound Measurements Length: (cm) 8 Width: (cm) 6 Depth: (cm) 0.1 Clustered Quantity: 3 Area: (cm) 37.699 Volume: (cm) 3.77 % Reduction in Area: 7.7% % Reduction in Volume: 7.7% Epithelialization: Medium (34-66%) Wound Description Classification: Full Thickness Without Exposed Support Structu Wound Margin: Flat and Intact Exudate Amount: Large Exudate Type: Serous Exudate Color: amber res Foul Odor After Cleansing: No Slough/Fibrino Yes Wound Bed Granulation Amount: Large (67-100%) Exposed Structure Granulation Quality: Pink Fascia Exposed: No Necrotic Amount: Small (1-33%) Fat Layer (Subcutaneous Tissue) Exposed: Yes Necrotic Quality: Adherent Slough Tendon Exposed: No Muscle Exposed: No Joint Exposed: No Bone Exposed: No Treatment Notes Wound #17 (Left, Circumferential Lower Leg) Notes [name], [name] ([id number]) scell, ABD, 3 layer bilateral Electronic Signature(s) Signed: 05/30/2019 2:01:12 PM By: Gretta Cool, BSN, RN, CWS, Kim RN, BSN Entered By: Gretta Cool, BSN, RN, CWS, Kim on 05/29/2019 12:36:04

## 2019-06-03 ENCOUNTER — Encounter: Payer: Medicare Other | Attending: Physician Assistant | Admitting: Physician Assistant

## 2019-06-03 ENCOUNTER — Other Ambulatory Visit: Payer: Self-pay

## 2019-06-03 DIAGNOSIS — I272 Pulmonary hypertension, unspecified: Secondary | ICD-10-CM | POA: Insufficient documentation

## 2019-06-03 DIAGNOSIS — I428 Other cardiomyopathies: Secondary | ICD-10-CM | POA: Diagnosis not present

## 2019-06-03 DIAGNOSIS — E1151 Type 2 diabetes mellitus with diabetic peripheral angiopathy without gangrene: Secondary | ICD-10-CM | POA: Diagnosis not present

## 2019-06-03 DIAGNOSIS — E11622 Type 2 diabetes mellitus with other skin ulcer: Secondary | ICD-10-CM | POA: Diagnosis not present

## 2019-06-03 DIAGNOSIS — I890 Lymphedema, not elsewhere classified: Secondary | ICD-10-CM | POA: Insufficient documentation

## 2019-06-03 DIAGNOSIS — E669 Obesity, unspecified: Secondary | ICD-10-CM | POA: Diagnosis not present

## 2019-06-03 DIAGNOSIS — I2510 Atherosclerotic heart disease of native coronary artery without angina pectoris: Secondary | ICD-10-CM | POA: Diagnosis not present

## 2019-06-03 DIAGNOSIS — L97212 Non-pressure chronic ulcer of right calf with fat layer exposed: Secondary | ICD-10-CM | POA: Insufficient documentation

## 2019-06-03 DIAGNOSIS — E785 Hyperlipidemia, unspecified: Secondary | ICD-10-CM | POA: Diagnosis not present

## 2019-06-03 DIAGNOSIS — Z6835 Body mass index (BMI) 35.0-35.9, adult: Secondary | ICD-10-CM | POA: Insufficient documentation

## 2019-06-03 DIAGNOSIS — L97222 Non-pressure chronic ulcer of left calf with fat layer exposed: Secondary | ICD-10-CM | POA: Insufficient documentation

## 2019-06-03 DIAGNOSIS — I110 Hypertensive heart disease with heart failure: Secondary | ICD-10-CM | POA: Insufficient documentation

## 2019-06-03 DIAGNOSIS — I509 Heart failure, unspecified: Secondary | ICD-10-CM | POA: Diagnosis not present

## 2019-06-03 DIAGNOSIS — J45909 Unspecified asthma, uncomplicated: Secondary | ICD-10-CM | POA: Diagnosis not present

## 2019-06-03 DIAGNOSIS — M1990 Unspecified osteoarthritis, unspecified site: Secondary | ICD-10-CM | POA: Insufficient documentation

## 2019-06-03 NOTE — Progress Notes (Addendum)
RAIMI, GUILLERMO ([id number]) Visit Report for 06/03/2019 Arrival Information Details Patient Name: [name], [name]. Date of Service: 06/03/2019 11:00 AM Medical Record Number: [id number] Patient Account Number: [id number] Date of Birth/Sex: 1945/02/07 (73 y.o. F) Treating RN: Army Melia Primary Care Vita Currin: Lelon Huh Other Clinician: Referring Veneta Sliter: Lelon Huh Treating Sloane Palmer/Extender: Melburn Hake, HOYT Weeks in Treatment: 7 Visit Information History Since Last Visit Added or deleted any medications: No Patient Arrived: Wheel Chair Any new allergies or adverse reactions: No Arrival Time: 10:51 Had a fall or experienced change in No Accompanied By: self activities of daily living that may affect Transfer Assistance: None risk of falls: Patient Identification Verified: Yes Signs or symptoms of abuse/neglect since last visito No Hospitalized since last visit: No Has Dressing in Place as Prescribed: Yes Pain Present Now: No Electronic Signature(s) Signed: 06/05/2019 11:31:22 AM By: Army Melia Previous Signature: 06/03/2019 10:51:41 AM Version By: Army Melia Entered By: Army Melia on 06/03/2019 10:56:49 [name] ([id number]) -------------------------------------------------------------------------------- Compression Therapy Details Patient Name: [name]. Date of Service: 06/03/2019 11:00 AM Medical Record Number: [id number] Patient Account Number: [id number] Date of Birth/Sex: 1945/01/18 (74 y.o. F) Treating RN: Montey Hora Primary Care Lisbet Busker: Lelon Huh Other Clinician: Referring Effie Janoski: Lelon Huh Treating Naveyah Iacovelli/Extender: Melburn Hake, HOYT Weeks in Treatment: 7 Compression Therapy Performed for Wound Assessment: Wound #14 Right,Anterior Lower Leg Performed By: Clinician Montey Hora, RN Compression Type: Three Layer Post Procedure Diagnosis Same as Pre-procedure Electronic Signature(s) Signed: 06/03/2019 4:28:28 PM By: Montey Hora Entered By: Montey Hora on 06/03/2019 11:16:21 [name] ([id number]) -------------------------------------------------------------------------------- Compression Therapy Details Patient Name: [name], [name]. Date of Service: 06/03/2019 11:00 AM Medical Record Number: [id number] Patient Account Number: [id number] Date of Birth/Sex: [birth year] (74 y.o. F) Treating RN: Montey Hora Primary Care Alena Blankenbeckler: Lelon Huh Other Clinician: Referring Renelle Stegenga: Lelon Huh Treating Thane Age/Extender: Melburn Hake, HOYT Weeks in Treatment: 7 Compression Therapy Performed for Wound Assessment: Wound #17 Left,Circumferential Lower Leg Performed By: Clinician Montey Hora, RN Compression Type: Three Layer Post Procedure Diagnosis Same as Pre-procedure Electronic Signature(s) Signed: 06/03/2019 4:28:28 PM By: Montey Hora Entered By: Montey Hora on 06/03/2019 11:16:58 [name] ([id number]) -------------------------------------------------------------------------------- Encounter Discharge Information Details Patient Name: [name], [name]. Date of Service: 06/03/2019 11:00 AM Medical Record Number: [id number] Patient Account Number: [id number] Date of Birth/Sex: [birth year] (74 y.o. F) Treating RN: Montey Hora Primary Care Lisl Slingerland: Lelon Huh Other Clinician: Referring Brianna Bennett: Lelon Huh Treating Vyctoria Dickman/Extender: Melburn Hake, HOYT Weeks in Treatment: 7 Encounter Discharge Information Items Discharge Condition: Stable Ambulatory Status: Wheelchair Discharge Destination: Home Transportation: Private Auto Accompanied By: spouse Schedule Follow-up Appointment: Yes Clinical Summary of Care: Electronic Signature(s) Signed: 06/03/2019 11:37:31 AM By: Montey Hora Entered By: Montey Hora on 06/03/2019 11:37:31 [name] ([id number]) -------------------------------------------------------------------------------- Lower Extremity Assessment  Details Patient Name: [name], [name]. Date of Service: 06/03/2019 11:00 AM Medical Record Number: [id number] Patient Account Number: [id number] Date of Birth/Sex: 04-11-1945 (74 y.o. F) Treating RN: Army Melia Primary Care Kymberlie Brazeau: Lelon Huh Other Clinician: Referring Leiby Pigeon: Lelon Huh Treating Anastasia Tompson/Extender: Melburn Hake, HOYT Weeks in Treatment: 7 Edema Assessment Assessed: [Left: No] [Right: No] Edema: [Left: No] [Right: No] Calf Left: Right: Point of Measurement: 30 cm From Medial Instep 47 cm 44 cm Ankle Left: Right: Point of Measurement: 10 cm From Medial Instep 24 cm 23 cm Vascular Assessment Pulses: Dorsalis Pedis Palpable: [Left:Yes] [Right:Yes] Electronic Signature(s) Signed: 06/05/2019 11:31:22 AM By: Army Melia Entered By: Army Melia on 06/03/2019 11:07:20 [name] ([id number]) --------------------------------------------------------------------------------  Multi Wound Chart Details Patient Name: [name], [name]. Date of Service: 06/03/2019 11:00 AM Medical Record Number: [id number] Patient Account Number: [id number] Date of Birth/Sex: 30-Jan-1945 (74 y.o. F) Treating RN: Montey Hora Primary Care Cordon Gassett: Lelon Huh Other Clinician: Referring Miyuki Rzasa: Lelon Huh Treating Vaidehi Braddy/Extender: Melburn Hake, HOYT Weeks in Treatment: 7 Vital Signs Height(in): 64 Pulse(bpm): 65 Weight(lbs): 206 Blood Pressure(mmHg): 176/65 Body Mass Index(BMI): 35 Temperature(F): 97.7 Respiratory Rate(breaths/min): 16 Photos: [N/A:N/A] Wound Location: Right, Anterior Lower Leg Left, Circumferential Lower Leg N/A Wounding Event: Blister Blister N/A Primary Etiology: Venous Leg Ulcer Venous Leg Ulcer N/A Secondary Etiology: Diabetic Wound/Ulcer of the Lower Diabetic Wound/Ulcer of the Lower N/A Extremity Extremity Comorbid History: Lymphedema, Asthma, Congestive Lymphedema, Asthma, Congestive N/A Heart Failure, Coronary Artery Heart Failure, Coronary  Artery Disease, Hypertension, Type II Disease, Hypertension, Type II Diabetes, Osteoarthritis Diabetes, Osteoarthritis Date Acquired: 03/31/2019 03/31/2019 N/A Weeks of Treatment: 7 7 N/A Wound Status: Open Open N/A Clustered Wound: Yes Yes N/A Clustered Quantity: 2 3 N/A Measurements L x W x D (cm) 0.5x0.5x0.1 6x2x0.1 N/A Area (cm) : 0.196 9.425 N/A Volume (cm) : 0.02 0.942 N/A % Reduction in Area: 98.90% 76.90% N/A % Reduction in Volume: 98.90% 76.90% N/A Classification: Full Thickness Without Exposed Full Thickness Without Exposed N/A Support Structures Support Structures Exudate Amount: Large Large N/A Exudate Type: Serous Serous N/A Exudate Color: amber amber N/A Wound Margin: Flat and Intact Flat and Intact N/A Granulation Amount: Large (67-100%) Large (67-100%) N/A Granulation Quality: Pink Pink N/A Necrotic Amount: None Present (0%) Small (1-33%) N/A Exposed Structures: Fat Layer (Subcutaneous Tissue) Fat Layer (Subcutaneous Tissue) N/A Exposed: Yes Exposed: Yes Fascia: No Fascia: No Tendon: No Tendon: No [name], [name]. ([id number]) Muscle: No Muscle: No Joint: No Joint: No Bone: No Bone: No Epithelialization: Large (67-100%) Medium (34-66%) N/A Treatment Notes Electronic Signature(s) Signed: 06/03/2019 4:28:28 PM By: Montey Hora Entered By: Montey Hora on 06/03/2019 11:12:39 [name] ([id number]) -------------------------------------------------------------------------------- [place] Details Patient Name: [name], [name]. Date of Service: 06/03/2019 11:00 AM Medical Record Number: [id number] Patient Account Number: [id number] Date of Birth/Sex: Sep 06, 1945 (74 y.o. F) Treating RN: Montey Hora Primary Care Annaleia Pence: Lelon Huh Other Clinician: Referring Zenaida Tesar: Lelon Huh Treating Eula Mazzola/Extender: Melburn Hake, HOYT Weeks in Treatment: 7 Active Inactive Orientation to the Wound Care Program Nursing  Diagnoses: Knowledge deficit related to the wound healing center program Goals: Patient/caregiver will verbalize understanding of the [facility] Program Date Initiated: 04/14/2019 Target Resolution Date: 05/09/2019 Goal Status: Active Interventions: Provide education on orientation to the wound center Notes: Venous Leg Ulcer Nursing Diagnoses: Potential for venous Insuffiency (use before diagnosis confirmed) Goals: Patient/caregiver will verbalize understanding of disease process and disease management Date Initiated: 04/14/2019 Target Resolution Date: 05/09/2019 Goal Status: Active Interventions: Assess peripheral edema status every visit. Notes: Wound/Skin Impairment Nursing Diagnoses: Impaired tissue integrity Goals: Ulcer/skin breakdown will have a volume reduction of 30% by week 4 Date Initiated: 04/14/2019 Target Resolution Date: 05/09/2019 Goal Status: Active Interventions: Assess ulceration(s) every visit Notes: Electronic Signature(s) Signed: 06/03/2019 4:28:28 PM By: Montey Hora Entered By: Montey Hora on 06/03/2019 11:12:30 [name] ([id number]) -------------------------------------------------------------------------------- Pain Assessment Details Patient Name: [name]. Date of Service: 06/03/2019 11:00 AM Medical Record Number: [id number] Patient Account Number: [id number] Date of Birth/Sex: 14-Apr-1945 (74 y.o. F) Treating RN: Army Melia Primary Care Carl Bleecker: Lelon Huh Other Clinician: Referring Thressa Shiffer: Lelon Huh Treating Surya Folden/Extender: Melburn Hake, HOYT Weeks in Treatment: 7 Active Problems Location of Pain Severity and Description of  Pain Patient Has Paino No Site Locations Pain Management and Medication Current Pain Management: Electronic Signature(s) Signed: 06/05/2019 11:31:22 AM By: Army Melia Entered By: Army Melia on 06/03/2019 10:58:18 [name]  ([id number]) -------------------------------------------------------------------------------- Patient/Caregiver Education Details Patient Name: [name] Date of Service: 06/03/2019 11:00 AM Medical Record Number: [id number] Patient Account Number: [id number] Date of Birth/Gender: June 28, 1945 (74 y.o. F) Treating RN: Montey Hora Primary Care Physician: Lelon Huh Other Clinician: Referring Physician: Lelon Huh Treating Physician/Extender: Sharalyn Ink in Treatment: 7 Education Assessment Education Provided To: Patient and Caregiver Education Topics Provided Venous: Handouts: Other: ongoing compr4ession Methods: Explain/Verbal Responses: Reinforcements needed Electronic Signature(s) Signed: 06/03/2019 4:28:28 PM By: Montey Hora Entered By: Montey Hora on 06/03/2019 11:36:43 [name] ([id number]) -------------------------------------------------------------------------------- Wound Assessment Details Patient Name: [name], [name]. Date of Service: 06/03/2019 11:00 AM Medical Record Number: [id number] Patient Account Number: [id number] Date of Birth/Sex: [birth year] (74 y.o. F) Treating RN: Army Melia Primary Care Copeland Neisen: Lelon Huh Other Clinician: Referring Maysie Parkhill: Lelon Huh Treating Aimar Borghi/Extender: Melburn Hake, HOYT Weeks in Treatment: 7 Wound Status Wound Number: 14 Primary Venous Leg Ulcer Etiology: Wound Location: Right, Anterior Lower Leg Secondary Diabetic Wound/Ulcer of the Lower Extremity Wounding Event: Blister Etiology: Date Acquired: 03/31/2019 Wound Open Weeks Of Treatment: 7 Status: Clustered Wound: Yes Comorbid Lymphedema, Asthma, Congestive Heart Failure, History: Coronary Artery Disease, Hypertension, Type II Diabetes, Osteoarthritis Photos Wound Measurements Length: (cm) 0.5 Width: (cm) 0.5 Depth: (cm) 0.1 Clustered Quantity: 2 Area: (cm) 0.196 Volume: (cm) 0.02 % Reduction in Area: 98.9% % Reduction  in Volume: 98.9% Epithelialization: Large (67-100%) Wound Description Classification: Full Thickness Without Exposed Support Structu Wound Margin: Flat and Intact Exudate Amount: Large Exudate Type: Serous Exudate Color: amber res Foul Odor After Cleansing: No Slough/Fibrino Yes Wound Bed Granulation Amount: Large (67-100%) Exposed Structure Granulation Quality: Pink Fascia Exposed: No Necrotic Amount: None Present (0%) Fat Layer (Subcutaneous Tissue) Exposed: Yes Tendon Exposed: No Muscle Exposed: No Joint Exposed: No Bone Exposed: No Electronic Signature(s) Signed: 06/05/2019 11:31:22 AM By: Army Melia Entered By: Army Melia on 06/03/2019 11:05:37 [name] ([id number]) -------------------------------------------------------------------------------- Wound Assessment Details Patient Name: [name]. Date of Service: 06/03/2019 11:00 AM Medical Record Number: [id number] Patient Account Number: [id number] Date of Birth/Sex: [birth year] (74 y.o. F) Treating RN: Army Melia Primary Care Mckinzi Eriksen: Lelon Huh Other Clinician: Referring Yarima Penman: Lelon Huh Treating Mahlon Gabrielle/Extender: Melburn Hake, HOYT Weeks in Treatment: 7 Wound Status Wound Number: 17 Primary Venous Leg Ulcer Etiology: Wound Location: Left, Circumferential Lower Leg Secondary Diabetic Wound/Ulcer of the Lower Extremity Wounding Event: Blister Etiology: Date Acquired: 03/31/2019 Wound Open Weeks Of Treatment: 7 Status: Clustered Wound: Yes Comorbid Lymphedema, Asthma, Congestive Heart Failure, History: Coronary Artery Disease, Hypertension, Type II Diabetes, Osteoarthritis Photos Wound Measurements Length: (cm) 6 Width: (cm) 2 Depth: (cm) 0.1 Clustered Quantity: 3 Area: (cm) 9.425 Volume: (cm) 0.942 % Reduction in Area: 76.9% % Reduction in Volume: 76.9% Epithelialization: Medium (34-66%) Wound Description Classification: Full Thickness Without Exposed Support Structu Wound  Margin: Flat and Intact Exudate Amount: Large Exudate Type: Serous Exudate Color: amber res Foul Odor After Cleansing: No Slough/Fibrino Yes Wound Bed Granulation Amount: Large (67-100%) Exposed Structure Granulation Quality: Pink Fascia Exposed: No Necrotic Amount: Small (1-33%) Fat Layer (Subcutaneous Tissue) Exposed: Yes Necrotic Quality: Adherent Slough Tendon Exposed: No Muscle Exposed: No Joint Exposed: No Bone Exposed: No Electronic Signature(s) Signed: 06/05/2019 11:31:22 AM By: Army Melia Entered By: Army Melia on 06/03/2019 [place], Rowena ([id number]) -------------------------------------------------------------------------------- Vitals Details  Patient Name: [name], [name]. Date of Service: 06/03/2019 11:00 AM Medical Record Number: [id number] Patient Account Number: [id number] Date of Birth/Sex: 01-12-1945 (74 y.o. F) Treating RN: Army Melia Primary Care Sameul Tagle: Lelon Huh Other Clinician: Referring Lakela Kuba: Lelon Huh Treating Aerika Groll/Extender: Melburn Hake, HOYT Weeks in Treatment: 7 Vital Signs Time Taken: 10:56 Temperature (F): 97.7 Height (in): 64 Pulse (bpm): 65 Weight (lbs): 206 Respiratory Rate (breaths/min): 16 Body Mass Index (BMI): 35.4 Blood Pressure (mmHg): 176/65 Reference Range: 80 - 120 mg / dl Electronic Signature(s) Signed: 06/05/2019 11:31:22 AM By: Army Melia Entered By: Army Melia on 06/03/2019 10:58:13

## 2019-06-03 NOTE — Progress Notes (Addendum)
LUJEAN, EBRIGHT ([id number]) Visit Report for 06/03/2019 Chief Complaint Document Details Patient Name: [name], [name]. Date of Service: 06/03/2019 11:00 AM Medical Record Number: [id number] Patient Account Number: [id number] Date of Birth/Sex: [birth year] (74 y.o. F) Treating RN: Montey Hora Primary Care Provider: Lelon Huh Other Clinician: Referring Provider: Lelon Huh Treating Provider/Extender: Melburn Hake, Cabrini Ruggieri Weeks in Treatment: 7 Information Obtained from: Patient Chief Complaint Patient seen for complaints of Non-Healing Wounds to both lower extremities Electronic Signature(s) Signed: 06/03/2019 11:08:48 AM By: Worthy Keeler PA-C Entered By: Worthy Keeler on 06/03/2019 11:08:47 [name] ([id number]) -------------------------------------------------------------------------------- HPI Details Patient Name: [name], Jillian B. Date of Service: 06/03/2019 11:00 AM Medical Record Number: [id number] Patient Account Number: [id number] Date of Birth/Sex: [birth year] (74 y.o. F) Treating RN: Montey Hora Primary Care Provider: Lelon Huh Other Clinician: Referring Provider: Lelon Huh Treating Provider/Extender: Melburn Hake, Corby Villasenor Weeks in Treatment: 7 History of Present Illness Location: both lower extremity swelling with ulceration HPI Description: 74 year old patient who sees her PCP Dr. Lelon Huh was recently evaluated 10 days ago for diabetes mellitus, hypertension, CHF and hyperlipidemia. she also was noted to have ulcerations develop in her legs and she has been applying Silvadene dressings locally. In the past she has refused wound care referrals.her cardiologist Dr. Saunders Revel saw her and put her on 40 mg of furosemide daily. last hemoglobin A1c was 7.7%. she was also placed on ciprofloxacin twice daily for 7 days and a urine culture was recommended. past medical history significant for coronary artery disease, diabetes mellitus, nonischemic cardiomyopathy and  pulmonary hypertension. She is also status post heart catheterization and coronary angiography, tubal ligation and breast cyst removal in the past. She has never been a smoker. Patient had arterial studies done which showed bilateral ABIs are artificially elevated due to noncompressible and calcified vessels. Triphasic waveform throughout. Right great toe TBI is elevated while the left is normal. 10/23/2016 -- the patient is rather moribund from several issues including chronic back pain and knee pain and swelling of her legs. The large necrotic area on her left lateral anterior calf was bleeding on touch after washing her leg. There was a spot which needed silver nitrate cauterization and this was done appropriately. 11/06/2016 -- she is again noted to have friable bleeding from the left lower extremity wounds and this again had to be cauterized with silver nitrate to control the bleeding as pressure itself would not do it. 11/20/2016 -- the right lower extremity is completely healed and we have ordered 30-40 mm compression stocking's in both the dural layer and also a pair of juxta lites. 12/18/2016 -- she has not been wearing her compression stockings on her right leg and these have opened out into ulcerations again. Her left lower extremity has circumferential ulcerations now. I believe at this stage I would like to get her venous reflux studies done to make certain that there is no fixable superficial venous reflux. 12/29/2016 -- she has made an excellent recovery having continued with her appropriate doses of diuretics and elevation and exercise. She has not yet received her right lower extremity juxta lites. Her venous duplex study is at the end of January 01/13/16 on evaluation today patient's wounds appeared to be overall doing much better much less hyper granular than previous week's evaluation. The Hydrofera Blue Dressing's to be doing very well. She does tell me that she is having a  little bit more discomfort in the posterior aspect of her leg where she has a wound  at this point. Fortunately there appears to be no infection. 01/19/17 on evaluation today patient appears to be doing very well in regard to her bilateral lower extremity swelling and at this point in time her left lower extremity ulcers. Her wounds appear to be doing much better. She has been tolerating the dressing's at this point fortunately she did get the Juxta-Lite compression as of today as well. Nonetheless I am pleased that she has been tolerating everything so well and that her wounds looks so good. In fact she is an excellent granular surface no evidence of slough covering and I do not see any reason for likely debridement today. Especially in regard to the posterior leg. 01/26/17 on evaluation today patient appears to be doing decently well in regard to her wounds. The surface of the majority of her wounds is greatly improved. She has been using the Juxta-Lite compression which is excellent. Nonetheless the wound of the left posterior calf did require some debridement today otherwise the majority of the wounds did not require any debridement. The this was due to slough buildup on the surface. She also has a small skin flap where two of the ulcers actually connected and this has loosened up I'm concerned that things may not heal well with that flap but for the time being I'm not gonna do anything different in that regard. 02/23/17 on evaluation today patient's wound actually appears to be doing fairly well in regard to the remaining left lateral ulcer which we have been treated. With that being said it does appear that her wrap actually slid down the wood bit over the past few days at least that there's actually some friction injury and blistering noted on the medial aspect of her malleolus of the left lower extremity. She continues to use the Juxta- Lite for the right lower extremity. With that being said no  debridement is necessary today and these are very superficial injuries as far as the new injuries are concerned. 03/02/17 on evaluation today patient appears to be doing excellent in regard to her left lateral lower from the ulcers. In fact it appears that she is almost completely healed in regard to the ulcer we have been following and the new injuries which were caused by the wraps slipping down last week have been completely resolved which is excellent news. Overall I'm definitely pleased with the progress that she has made. Fortunately the rat did not cause any irritation as it did previously with the Unna wrap. 03/09/17 on evaluation today patient's wound appears to be completely healed. Obviously this is great news. She and her husband both are extremely pleased and excited to finally have this gone. Obviously she has been dealing with this wound for quite some time. Readmission: 04/14/2019 upon evaluation today patient appears for reevaluation here in our clinic although it has been since March 2019 since I last saw her. She unfortunately is having issues similar to what she had previous which are blisters and ulcerations of the bilateral lower extremities. Fortunately there is no evidence of active infection at this time based on what I am seeing. Unfortunately she is still having a lot of drainage and weeping. I do believe she needs to be compression wrap in order to get this under control and she is previously tolerated a 3 layer compression wrap without any complications whatsoever. No fevers, chills, nausea, vomiting, or diarrhea. CNIYAH, SPROULL ([id number]) 04/28/2019 upon evaluation today patient appears to be doing well with regard to the  bilateral lower extremities at this time. She has been tolerating the dressing changes without complication. Fortunately there is no signs of active infection at this time. No fevers, chills, nausea, vomiting, or diarrhea. 05/05/2019 upon evaluation today  patient appears to be doing excellent in regard to her legs. She has a lot of new skin growth and overall seems to be progressing quite nicely. Fortunately there is no signs of active infection at this time. No fevers, chills, nausea, vomiting, or diarrhea. 05/12/2019 upon evaluation today patient appears to be doing very well with regard to her lower extremities. She is not completely healed at this point but nonetheless he is doing much better even compared to last week. I am very pleased with where things stand. No fevers, chills, nausea, vomiting, or diarrhea. 05/26/2019 upon evaluation today patient actually appears to be doing worse with regard to her lower extremities. She has green drainage unfortunately is having more pain on the right lower extremity as well. Fortunately there is no signs of active infection systemically which is good news. 06/03/2019 upon evaluation today patient appears to be doing about the same in regard to the fact that she still has wounds on both lower extremities although all wounds are measuring better and appearing much better. She finally did start taking the medication which has made a big difference for her that is the Levaquin. Unfortunately this appears to be making her urinate much more frequently. She has been taking this in the evening therefore it is keeping her up at night. They are wondering if they can switch to taking this during the day. I would actually never instructed that they had to take this at night and I think it is definitely fine for her to take it in the morning. She will start that tomorrow. Electronic Signature(s) Signed: 06/03/2019 1:01:18 PM By: Worthy Keeler PA-C Entered By: Worthy Keeler on 06/03/2019 13:01:18 [name] ([id number]) -------------------------------------------------------------------------------- Physical Exam Details Patient Name: [name], Jillian B. Date of Service: 06/03/2019 11:00 AM Medical Record Number:  [id number] Patient Account Number: [id number] Date of Birth/Sex: 1945-06-17 (74 y.o. F) Treating RN: Montey Hora Primary Care Provider: Lelon Huh Other Clinician: Referring Provider: Lelon Huh Treating Provider/Extender: Melburn Hake, Tomi Grandpre Weeks in Treatment: 7 Constitutional Well-nourished and well-hydrated in no acute distress. Respiratory normal breathing without difficulty. Psychiatric this patient is able to make decisions and demonstrates good insight into disease process. Alert and Oriented x 3. pleasant and cooperative. Notes Upon inspection patient's wounds actually showed signs of significant improvement. Overall I am extremely pleased with the fact that the patient seems to be doing quite well she is tolerating the dressing changes without complication and in general I feel like she is making good progress. Electronic Signature(s) Signed: 06/03/2019 1:01:43 PM By: Worthy Keeler PA-C Entered By: Worthy Keeler on 06/03/2019 13:01:43 [name] ([id number]) -------------------------------------------------------------------------------- Physician Orders Details Patient Name: [name], Jillian B. Date of Service: 06/03/2019 11:00 AM Medical Record Number: [id number] Patient Account Number: [id number] Date of Birth/Sex: 1945/10/05 (74 y.o. F) Treating RN: Montey Hora Primary Care Provider: Lelon Huh Other Clinician: Referring Provider: Lelon Huh Treating Provider/Extender: Melburn Hake, Taiana Temkin Weeks in Treatment: 7 Verbal / Phone Orders: No Diagnosis Coding ICD-10 Coding Code Description E11.622 Type 2 diabetes mellitus with other skin ulcer I89.0 Lymphedema, not elsewhere classified L97.212 Non-pressure chronic ulcer of right calf with fat layer exposed L97.222 Non-pressure chronic ulcer of left calf with fat layer exposed Wound Cleansing Wound #14 Right,Anterior Lower  Leg o Dial antibacterial soap, wash wounds, rinse and pat dry prior to dressing wounds  - HH nurse and in office o May shower with protection. - Do not get wraps wet Wound #17 Left,Circumferential Lower Leg o Dial antibacterial soap, wash wounds, rinse and pat dry prior to dressing wounds - HH nurse and in office o May shower with protection. - Do not get wraps wet Primary Wound Dressing Wound #14 Right,Anterior Lower Leg o Silver Alginate Wound #17 Left,Circumferential Lower Leg o Silver Alginate Secondary Dressing Wound #14 Right,Anterior Lower Leg o ABD pad Wound #17 Left,Circumferential Lower Leg o ABD pad Dressing Change Frequency Wound #14 Right,Anterior Lower Leg o Change dressing every week Wound #17 Left,Circumferential Lower Leg o Change dressing every week Follow-up Appointments Wound #14 Right,Anterior Lower Leg o Return Appointment in 1 week. o Nurse Visit as needed Wound #17 Left,Circumferential Lower Leg o Return Appointment in 1 week. o Nurse Visit as needed Edema Control Wound #14 Right,Anterior Lower Leg o 3 Layer Compression System - Bilateral o Elevate legs to the level of the heart and pump ankles as often as possible Wound #17 Left,Circumferential Lower Leg o 3 Layer Compression System - Bilateral [name], Jillian B. ([id number]) o Elevate legs to the level of the heart and pump ankles as often as possible Electronic Signature(s) Signed: 06/03/2019 3:51:15 PM By: Worthy Keeler PA-C Signed: 06/03/2019 4:28:28 PM By: Montey Hora Entered By: Montey Hora on 06/03/2019 11:17:43 [name] ([id number]) -------------------------------------------------------------------------------- Problem List Details Patient Name: BEDIE, DOMINEY B. Date of Service: 06/03/2019 11:00 AM Medical Record Number: [id number] Patient Account Number: [id number] Date of Birth/Sex: 29-Sep-1945 (74 y.o. F) Treating RN: Montey Hora Primary Care Provider: Lelon Huh Other Clinician: Referring Provider: Lelon Huh Treating  Provider/Extender: Melburn Hake, Bardia Wangerin Weeks in Treatment: 7 Active Problems ICD-10 Encounter Code Description Active Date MDM Diagnosis E11.622 Type 2 diabetes mellitus with other skin ulcer 04/14/2019 No Yes I89.0 Lymphedema, not elsewhere classified 04/14/2019 No Yes L97.212 Non-pressure chronic ulcer of right calf with fat layer exposed 04/14/2019 No Yes L97.222 Non-pressure chronic ulcer of left calf with fat layer exposed 04/14/2019 No Yes Inactive Problems Resolved Problems Electronic Signature(s) Signed: 06/03/2019 11:08:04 AM By: Worthy Keeler PA-C Entered By: Worthy Keeler on 06/03/2019 11:08:04 [name] ([id number]) -------------------------------------------------------------------------------- Progress Note Details Patient Name: Jillian Albe B. Date of Service: 06/03/2019 11:00 AM Medical Record Number: [id number] Patient Account Number: [id number] Date of Birth/Sex: 08-27-1945 (74 y.o. F) Treating RN: Montey Hora Primary Care Provider: Lelon Huh Other Clinician: Referring Provider: Lelon Huh Treating Provider/Extender: Melburn Hake, Judianne Seiple Weeks in Treatment: 7 Subjective Chief Complaint Information obtained from Patient Patient seen for complaints of Non-Healing Wounds to both lower extremities History of Present Illness (HPI) The following HPI elements were documented for the patient's wound: Location: both lower extremity swelling with ulceration 74 year old patient who sees her PCP Dr. Lelon Huh was recently evaluated 10 days ago for diabetes mellitus, hypertension, CHF and hyperlipidemia. she also was noted to have ulcerations develop in her legs and she has been applying Silvadene dressings locally. In the past she has refused wound care referrals.her cardiologist Dr. Saunders Revel saw her and put her on 40 mg of furosemide daily. last hemoglobin A1c was 7.7%. she was also placed on ciprofloxacin twice daily for 7 days and a urine culture was recommended. past  medical history significant for coronary artery disease, diabetes mellitus, nonischemic cardiomyopathy and pulmonary hypertension. She is also status post heart catheterization and coronary angiography,  tubal ligation and breast cyst removal in the past. She has never been a smoker. Patient had arterial studies done which showed bilateral ABIs are artificially elevated due to noncompressible and calcified vessels. Triphasic waveform throughout. Right great toe TBI is elevated while the left is normal. 10/23/2016 -- the patient is rather moribund from several issues including chronic back pain and knee pain and swelling of her legs. The large necrotic area on her left lateral anterior calf was bleeding on touch after washing her leg. There was a spot which needed silver nitrate cauterization and this was done appropriately. 11/06/2016 -- she is again noted to have friable bleeding from the left lower extremity wounds and this again had to be cauterized with silver nitrate to control the bleeding as pressure itself would not do it. 11/20/2016 -- the right lower extremity is completely healed and we have ordered 30-40 mm compression stocking's in both the dural layer and also a pair of juxta lites. 12/18/2016 -- she has not been wearing her compression stockings on her right leg and these have opened out into ulcerations again. Her left lower extremity has circumferential ulcerations now. I believe at this stage I would like to get her venous reflux studies done to make certain that there is no fixable superficial venous reflux. 12/29/2016 -- she has made an excellent recovery having continued with her appropriate doses of diuretics and elevation and exercise. She has not yet received her right lower extremity juxta lites. Her venous duplex study is at the end of January 01/13/16 on evaluation today patient's wounds appeared to be overall doing much better much less hyper granular than previous  week's evaluation. The Hydrofera Blue Dressing's to be doing very well. She does tell me that she is having a little bit more discomfort in the posterior aspect of her leg where she has a wound at this point. Fortunately there appears to be no infection. 01/19/17 on evaluation today patient appears to be doing very well in regard to her bilateral lower extremity swelling and at this point in time her left lower extremity ulcers. Her wounds appear to be doing much better. She has been tolerating the dressing's at this point fortunately she did get the Juxta-Lite compression as of today as well. Nonetheless I am pleased that she has been tolerating everything so well and that her wounds looks so good. In fact she is an excellent granular surface no evidence of slough covering and I do not see any reason for likely debridement today. Especially in regard to the posterior leg. 01/26/17 on evaluation today patient appears to be doing decently well in regard to her wounds. The surface of the majority of her wounds is greatly improved. She has been using the Juxta-Lite compression which is excellent. Nonetheless the wound of the left posterior calf did require some debridement today otherwise the majority of the wounds did not require any debridement. The this was due to slough buildup on the surface. She also has a small skin flap where two of the ulcers actually connected and this has loosened up I'm concerned that things may not heal well with that flap but for the time being I'm not gonna do anything different in that regard. 02/23/17 on evaluation today patient's wound actually appears to be doing fairly well in regard to the remaining left lateral ulcer which we have been treated. With that being said it does appear that her wrap actually slid down the wood bit over the past few  days at least that there's actually some friction injury and blistering noted on the medial aspect of her malleolus of the left  lower extremity. She continues to use the Juxta- Lite for the right lower extremity. With that being said no debridement is necessary today and these are very superficial injuries as far as the new injuries are concerned. 03/02/17 on evaluation today patient appears to be doing excellent in regard to her left lateral lower from the ulcers. In fact it appears that she is almost completely healed in regard to the ulcer we have been following and the new injuries which were caused by the wraps slipping down last week have been completely resolved which is excellent news. Overall I'm definitely pleased with the progress that she has made. Fortunately the rat did not cause any irritation as it did previously with the Unna wrap. 03/09/17 on evaluation today patient's wound appears to be completely healed. Obviously this is great news. She and her husband both are extremely pleased and excited to finally have this gone. Obviously she has been dealing with this wound for quite some time. Readmission: [name], [name] ([id number]) 04/14/2019 upon evaluation today patient appears for reevaluation here in our clinic although it has been since March 2019 since I last saw her. She unfortunately is having issues similar to what she had previous which are blisters and ulcerations of the bilateral lower extremities. Fortunately there is no evidence of active infection at this time based on what I am seeing. Unfortunately she is still having a lot of drainage and weeping. I do believe she needs to be compression wrap in order to get this under control and she is previously tolerated a 3 layer compression wrap without any complications whatsoever. No fevers, chills, nausea, vomiting, or diarrhea. 04/28/2019 upon evaluation today patient appears to be doing well with regard to the bilateral lower extremities at this time. She has been tolerating the dressing changes without complication. Fortunately there is no signs of  active infection at this time. No fevers, chills, nausea, vomiting, or diarrhea. 05/05/2019 upon evaluation today patient appears to be doing excellent in regard to her legs. She has a lot of new skin growth and overall seems to be progressing quite nicely. Fortunately there is no signs of active infection at this time. No fevers, chills, nausea, vomiting, or diarrhea. 05/12/2019 upon evaluation today patient appears to be doing very well with regard to her lower extremities. She is not completely healed at this point but nonetheless he is doing much better even compared to last week. I am very pleased with where things stand. No fevers, chills, nausea, vomiting, or diarrhea. 05/26/2019 upon evaluation today patient actually appears to be doing worse with regard to her lower extremities. She has green drainage unfortunately is having more pain on the right lower extremity as well. Fortunately there is no signs of active infection systemically which is good news. 06/03/2019 upon evaluation today patient appears to be doing about the same in regard to the fact that she still has wounds on both lower extremities although all wounds are measuring better and appearing much better. She finally did start taking the medication which has made a big difference for her that is the Levaquin. Unfortunately this appears to be making her urinate much more frequently. She has been taking this in the evening therefore it is keeping her up at night. They are wondering if they can switch to taking this during the day. I would actually never  instructed that they had to take this at night and I think it is definitely fine for her to take it in the morning. She will start that tomorrow. Objective Constitutional Well-nourished and well-hydrated in no acute distress. Vitals Time Taken: 10:56 AM, Height: 64 in, Weight: 206 lbs, BMI: 35.4, Temperature: 97.7 F, Pulse: 65 bpm, Respiratory Rate: 16 breaths/min, Blood Pressure:  176/65 mmHg. Respiratory normal breathing without difficulty. Psychiatric this patient is able to make decisions and demonstrates good insight into disease process. Alert and Oriented x 3. pleasant and cooperative. General Notes: Upon inspection patient's wounds actually showed signs of significant improvement. Overall I am extremely pleased with the fact that the patient seems to be doing quite well she is tolerating the dressing changes without complication and in general I feel like she is making good progress. Integumentary (Hair, Skin) Wound #14 status is Open. Original cause of wound was Blister. The wound is located on the Right,Anterior Lower Leg. The wound measures 0.5cm length x 0.5cm width x 0.1cm depth; 0.196cm^2 area and 0.02cm^3 volume. There is Fat Layer (Subcutaneous Tissue) Exposed exposed. There is a large amount of serous drainage noted. The wound margin is flat and intact. There is large (67-100%) pink granulation within the wound bed. There is no necrotic tissue within the wound bed. Wound #17 status is Open. Original cause of wound was Blister. The wound is located on the Left,Circumferential Lower Leg. The wound measures 6cm length x 2cm width x 0.1cm depth; 9.425cm^2 area and 0.942cm^3 volume. There is Fat Layer (Subcutaneous Tissue) Exposed exposed. There is a large amount of serous drainage noted. The wound margin is flat and intact. There is large (67-100%) pink granulation within the wound bed. There is a small (1-33%) amount of necrotic tissue within the wound bed including Adherent Slough. Assessment Active Problems ICD-10 Type 2 diabetes mellitus with other skin ulcer Lymphedema, not elsewhere classified Non-pressure chronic ulcer of right calf with fat layer exposed [name], Jillian B. ([id number]) Non-pressure chronic ulcer of left calf with fat layer exposed Procedures Wound #14 Pre-procedure diagnosis of Wound #14 is a Venous Leg Ulcer located on the  Right,Anterior Lower Leg . There was a Three Layer Compression Therapy Procedure by Montey Hora, RN. Post procedure Diagnosis Wound #14: Same as Pre-Procedure Wound #17 Pre-procedure diagnosis of Wound #17 is a Venous Leg Ulcer located on the Left,Circumferential Lower Leg . There was a Three Layer Compression Therapy Procedure by Montey Hora, RN. Post procedure Diagnosis Wound #17: Same as Pre-Procedure Plan Wound Cleansing: Wound #14 Right,Anterior Lower Leg: Dial antibacterial soap, wash wounds, rinse and pat dry prior to dressing wounds - HH nurse and in office May shower with protection. - Do not get wraps wet Wound #17 Left,Circumferential Lower Leg: Dial antibacterial soap, wash wounds, rinse and pat dry prior to dressing wounds - HH nurse and in office May shower with protection. - Do not get wraps wet Primary Wound Dressing: Wound #14 Right,Anterior Lower Leg: Silver Alginate Wound #17 Left,Circumferential Lower Leg: Silver Alginate Secondary Dressing: Wound #14 Right,Anterior Lower Leg: ABD pad Wound #17 Left,Circumferential Lower Leg: ABD pad Dressing Change Frequency: Wound #14 Right,Anterior Lower Leg: Change dressing every week Wound #17 Left,Circumferential Lower Leg: Change dressing every week Follow-up Appointments: Wound #14 Right,Anterior Lower Leg: Return Appointment in 1 week. Nurse Visit as needed Wound #17 Left,Circumferential Lower Leg: Return Appointment in 1 week. Nurse Visit as needed Edema Control: Wound #14 Right,Anterior Lower Leg: 3 Layer Compression System - Bilateral Elevate legs  to the level of the heart and pump ankles as often as possible Wound #17 Left,Circumferential Lower Leg: 3 Layer Compression System - Bilateral Elevate legs to the level of the heart and pump ankles as often as possible 1. I would recommend currently that we go ahead and continue with the compression wraps bilaterally. The patient is in agreement the plan  and seems to be doing well as far as that is concerned. 2. I am also can recommend at this time that the patient continue to elevate her legs I think that is important as far as try to keep the edema under good control. That was discussed with the patient today as well. 3. As far as infection is concerned she needs to continue to take the Levaquin though I do believe she can take this in the morning there is no issue with that. We will see patient back for reevaluation in 1 week here in the clinic. If anything worsens or changes patient will contact our office for additional recommendations. [name], [name] ([id number]) Electronic Signature(s) Signed: 06/03/2019 1:02:27 PM By: Worthy Keeler PA-C Entered By: Worthy Keeler on 06/03/2019 13:02:27 [name] ([id number]) -------------------------------------------------------------------------------- SuperBill Details Patient Name: [name] Date of Service: 06/03/2019 Medical Record Number: [id number] Patient Account Number: [id number] Date of Birth/Sex: 06-03-1945 (74 y.o. F) Treating RN: Montey Hora Primary Care Provider: Lelon Huh Other Clinician: Referring Provider: Lelon Huh Treating Provider/Extender: Melburn Hake, Tobie Perdue Weeks in Treatment: 7 Diagnosis Coding ICD-10 Codes Code Description E11.622 Type 2 diabetes mellitus with other skin ulcer I89.0 Lymphedema, not elsewhere classified L97.212 Non-pressure chronic ulcer of right calf with fat layer exposed L97.222 Non-pressure chronic ulcer of left calf with fat layer exposed Facility Procedures CPT4: Description Modifier Quantity Code [id number] BILATERAL: Application of multi-layer venous compression system; leg (below knee), including 1 ankle and foot. Physician Procedures CPT4 Code: 1287867 Description: 67209 - WC PHYS LEVEL 3 - EST PT Modifier: Quantity: 1 CPT4 Code: Description: ICD-10 Diagnosis Description E11.622 Type 2 diabetes mellitus with  other skin ulcer I89.0 Lymphedema, not elsewhere classified L97.212 Non-pressure chronic ulcer of right calf with fat layer exposed L97.222 Non-pressure chronic ulcer of left  calf with fat layer exposed Modifier: Quantity: Electronic Signature(s) Signed: 06/03/2019 1:02:41 PM By: Worthy Keeler PA-C Previous Signature: 06/03/2019 11:36:10 AM Version By: Montey Hora Entered By: Worthy Keeler on 06/03/2019 13:02:41

## 2019-06-05 ENCOUNTER — Other Ambulatory Visit: Payer: Self-pay

## 2019-06-05 DIAGNOSIS — E11622 Type 2 diabetes mellitus with other skin ulcer: Secondary | ICD-10-CM | POA: Diagnosis not present

## 2019-06-05 DIAGNOSIS — J45909 Unspecified asthma, uncomplicated: Secondary | ICD-10-CM | POA: Diagnosis not present

## 2019-06-05 DIAGNOSIS — I509 Heart failure, unspecified: Secondary | ICD-10-CM | POA: Diagnosis not present

## 2019-06-05 DIAGNOSIS — E785 Hyperlipidemia, unspecified: Secondary | ICD-10-CM | POA: Diagnosis not present

## 2019-06-05 DIAGNOSIS — L97222 Non-pressure chronic ulcer of left calf with fat layer exposed: Secondary | ICD-10-CM | POA: Diagnosis not present

## 2019-06-05 DIAGNOSIS — L97212 Non-pressure chronic ulcer of right calf with fat layer exposed: Secondary | ICD-10-CM | POA: Diagnosis not present

## 2019-06-05 DIAGNOSIS — E1151 Type 2 diabetes mellitus with diabetic peripheral angiopathy without gangrene: Secondary | ICD-10-CM | POA: Diagnosis not present

## 2019-06-05 DIAGNOSIS — I2510 Atherosclerotic heart disease of native coronary artery without angina pectoris: Secondary | ICD-10-CM | POA: Diagnosis not present

## 2019-06-05 DIAGNOSIS — I272 Pulmonary hypertension, unspecified: Secondary | ICD-10-CM | POA: Diagnosis not present

## 2019-06-05 DIAGNOSIS — I110 Hypertensive heart disease with heart failure: Secondary | ICD-10-CM | POA: Diagnosis not present

## 2019-06-05 DIAGNOSIS — M1990 Unspecified osteoarthritis, unspecified site: Secondary | ICD-10-CM | POA: Diagnosis not present

## 2019-06-05 DIAGNOSIS — I428 Other cardiomyopathies: Secondary | ICD-10-CM | POA: Diagnosis not present

## 2019-06-05 DIAGNOSIS — I890 Lymphedema, not elsewhere classified: Secondary | ICD-10-CM | POA: Diagnosis not present

## 2019-06-05 NOTE — Progress Notes (Signed)
[name] ([id number]) Visit Report for 06/05/2019 Arrival Information Details Patient Name: [name], [name]. Date of Service: 06/05/2019 10:45 AM Medical Record Number: [id number] Patient Account Number: [id number] Date of Birth/Sex: 1945-04-14 (74 y.o. F) Treating RN: Montey Hora Primary Care Romon Devereux: Lelon Huh Other Clinician: Referring Pearlean Sabina: Lelon Huh Treating Kaylla Cobos/Extender: Melburn Hake, HOYT Weeks in Treatment: 7 Visit Information History Since Last Visit Added or deleted any medications: No Patient Arrived: Wheel Chair Any new allergies or adverse reactions: No Arrival Time: 11:01 Had a fall or experienced change in No Accompanied By: spouse activities of daily living that may affect Transfer Assistance: Manual risk of falls: Patient Identification Verified: Yes Signs or symptoms of abuse/neglect since last visito No Secondary Verification Process Completed: Yes Hospitalized since last visit: No Implantable device outside of the clinic excluding No cellular tissue based products placed in the center since last visit: Has Dressing in Place as Prescribed: Yes Has Compression in Place as Prescribed: Yes Pain Present Now: No Electronic Signature(s) Signed: 06/05/2019 4:34:20 PM By: Montey Hora Entered By: Montey Hora on 06/05/2019 11:02:17 [name] ([id number]) -------------------------------------------------------------------------------- Compression Therapy Details Patient Name: [name]. Date of Service: 06/05/2019 10:45 AM Medical Record Number: [id number] Patient Account Number: [id number] Date of Birth/Sex: 17-Nov-1945 (74 y.o. F) Treating RN: Montey Hora Primary Care Yuval Nolet: Lelon Huh Other Clinician: Referring Shoua Ressler: Lelon Huh Treating Wyndell Cardiff/Extender: Melburn Hake, HOYT Weeks in Treatment: 7 Compression Therapy Performed for Wound Assessment: Wound #14 Right,Anterior Lower Leg Performed By: Clinician Montey Hora, RN Compression Type: Three Layer Electronic Signature(s) Signed: 06/05/2019 11:55:18 AM By: Montey Hora Entered By: Montey Hora on 06/05/2019 11:55:17 [name] ([id number]) -------------------------------------------------------------------------------- Compression Therapy Details Patient Name: [name], [name]. Date of Service: 06/05/2019 10:45 AM Medical Record Number: [id number] Patient Account Number: [id number] Date of Birth/Sex: February 24, 1945 (74 y.o. F) Treating RN: Montey Hora Primary Care Jeferson Boozer: Lelon Huh Other Clinician: Referring Gareld Obrecht: Lelon Huh Treating Raisa Ditto/Extender: Melburn Hake, HOYT Weeks in Treatment: 7 Compression Therapy Performed for Wound Assessment: Wound #17 Left,Circumferential Lower Leg Performed By: Clinician Montey Hora, RN Compression Type: Three Layer Electronic Signature(s) Signed: 06/05/2019 11:55:18 AM By: Montey Hora Entered By: Montey Hora on 06/05/2019 11:55:18 [name] ([id number]) -------------------------------------------------------------------------------- Encounter Discharge Information Details Patient Name: [name], [name]. Date of Service: 06/05/2019 10:45 AM Medical Record Number: [id number] Patient Account Number: [id number] Date of Birth/Sex: [birth year] (74 y.o. F) Treating RN: Montey Hora Primary Care Rashiya Lofland: Lelon Huh Other Clinician: Referring Renda Pohlman: Lelon Huh Treating Danny Yackley/Extender: Melburn Hake, HOYT Weeks in Treatment: 7 Encounter Discharge Information Items Discharge Condition: Stable Ambulatory Status: Wheelchair Discharge Destination: Home Transportation: Private Auto Accompanied By: spouse Schedule Follow-up Appointment: No Clinical Summary of Care: Electronic Signature(s) Signed: 06/05/2019 11:56:00 AM By: Montey Hora Entered By: Montey Hora on 06/05/2019 11:56:00 [name]  ([id number]) -------------------------------------------------------------------------------- Wound Assessment Details Patient Name: [name], [name]. Date of Service: 06/05/2019 10:45 AM Medical Record Number: [id number] Patient Account Number: [id number] Date of Birth/Sex: 20-Jul-1945 (74 y.o. F) Treating RN: Montey Hora Primary Care Trenese Haft: Lelon Huh Other Clinician: Referring Cassy Sprowl: Lelon Huh Treating Izick Gasbarro/Extender: Melburn Hake, HOYT Weeks in Treatment: 7 Wound Status Wound Number: 14 Primary Venous Leg Ulcer Etiology: Wound Location: Right, Anterior Lower Leg Secondary Diabetic Wound/Ulcer of the Lower Extremity Wounding Event: Blister Etiology: Date Acquired: 03/31/2019 Wound Open Weeks Of Treatment: 7 Status: Clustered Wound: Yes Comorbid Lymphedema, Asthma, Congestive Heart Failure, History: Coronary Artery Disease, Hypertension, Type II Diabetes, Osteoarthritis Photos Wound Measurements Length: (cm) 0.7  Width: (cm) 0.7 Depth: (cm) 0.1 Clustered Quantity: 1 Area: (cm) 0.385 Volume: (cm) 0.038 % Reduction in Area: 97.9% % Reduction in Volume: 97.9% Epithelialization: Large (67-100%) Tunneling: No Undermining: No Wound Description Classification: Full Thickness Without Exposed Support Structu Wound Margin: Flat and Intact Exudate Amount: Small Exudate Type: Serous Exudate Color: amber res Foul Odor After Cleansing: No Slough/Fibrino No Wound Bed Granulation Amount: Large (67-100%) Exposed Structure Granulation Quality: Pink Fascia Exposed: No Necrotic Amount: None Present (0%) Fat Layer (Subcutaneous Tissue) Exposed: Yes Tendon Exposed: No Muscle Exposed: No Joint Exposed: No Bone Exposed: No Treatment Notes Wound #14 (Right, Anterior Lower Leg) Notes [name], [name]. ([id number]) scell, ABD, 3 layer bilateral Electronic Signature(s) Signed: 06/05/2019 11:52:42 AM By: Montey Hora Entered By: Montey Hora on 06/05/2019  11:52:41 [name] ([id number]) -------------------------------------------------------------------------------- Wound Assessment Details Patient Name: [name]. Date of Service: 06/05/2019 10:45 AM Medical Record Number: [id number] Patient Account Number: [id number] Date of Birth/Sex: February 13, 1945 (74 y.o. F) Treating RN: Montey Hora Primary Care Inell Mimbs: Lelon Huh Other Clinician: Referring Koen Antilla: Lelon Huh Treating Kendrick Remigio/Extender: Melburn Hake, HOYT Weeks in Treatment: 7 Wound Status Wound Number: 17 Primary Venous Leg Ulcer Etiology: Wound Location: Left, Circumferential Lower Leg Secondary Diabetic Wound/Ulcer of the Lower Extremity Wounding Event: Blister Etiology: Date Acquired: 03/31/2019 Wound Open Weeks Of Treatment: 7 Status: Clustered Wound: Yes Comorbid Lymphedema, Asthma, Congestive Heart Failure, History: Coronary Artery Disease, Hypertension, Type II Diabetes, Osteoarthritis Photos Wound Measurements Length: (cm) 0.1 Width: (cm) 0.1 Depth: (cm) 0.1 Clustered Quantity: 1 Area: (cm) 0.008 Volume: (cm) 0.001 % Reduction in Area: 100% % Reduction in Volume: 100% Epithelialization: Large (67-100%) Tunneling: No Undermining: No Wound Description Classification: Full Thickness Without Exposed Support Structures Wound Margin: Flat and Intact Exudate Amount: None Present Foul Odor After Cleansing: No Slough/Fibrino No Wound Bed Granulation Amount: None Present (0%) Exposed Structure Necrotic Amount: None Present (0%) Fascia Exposed: No Fat Layer (Subcutaneous Tissue) Exposed: No Tendon Exposed: No Muscle Exposed: No Joint Exposed: No Bone Exposed: No Limited to Skin Breakdown Treatment Notes Wound #17 (Left, Circumferential Lower Leg) Notes scell, ABD, 3 layer bilateral [name], [name] ([id number]) Electronic Signature(s) Signed: 06/05/2019 11:53:32 AM By: Montey Hora Entered By: Montey Hora on 06/05/2019 11:53:31

## 2019-06-10 ENCOUNTER — Other Ambulatory Visit: Payer: Self-pay

## 2019-06-10 ENCOUNTER — Encounter: Payer: Medicare Other | Admitting: Physician Assistant

## 2019-06-10 DIAGNOSIS — I2510 Atherosclerotic heart disease of native coronary artery without angina pectoris: Secondary | ICD-10-CM | POA: Diagnosis not present

## 2019-06-10 DIAGNOSIS — I272 Pulmonary hypertension, unspecified: Secondary | ICD-10-CM | POA: Diagnosis not present

## 2019-06-10 DIAGNOSIS — J45909 Unspecified asthma, uncomplicated: Secondary | ICD-10-CM | POA: Diagnosis not present

## 2019-06-10 DIAGNOSIS — L97212 Non-pressure chronic ulcer of right calf with fat layer exposed: Secondary | ICD-10-CM | POA: Diagnosis not present

## 2019-06-10 DIAGNOSIS — I428 Other cardiomyopathies: Secondary | ICD-10-CM | POA: Diagnosis not present

## 2019-06-10 DIAGNOSIS — E11622 Type 2 diabetes mellitus with other skin ulcer: Secondary | ICD-10-CM | POA: Diagnosis not present

## 2019-06-10 DIAGNOSIS — I110 Hypertensive heart disease with heart failure: Secondary | ICD-10-CM | POA: Diagnosis not present

## 2019-06-10 DIAGNOSIS — I509 Heart failure, unspecified: Secondary | ICD-10-CM | POA: Diagnosis not present

## 2019-06-10 DIAGNOSIS — M1990 Unspecified osteoarthritis, unspecified site: Secondary | ICD-10-CM | POA: Diagnosis not present

## 2019-06-10 DIAGNOSIS — L97222 Non-pressure chronic ulcer of left calf with fat layer exposed: Secondary | ICD-10-CM | POA: Diagnosis not present

## 2019-06-10 DIAGNOSIS — E1151 Type 2 diabetes mellitus with diabetic peripheral angiopathy without gangrene: Secondary | ICD-10-CM | POA: Diagnosis not present

## 2019-06-10 DIAGNOSIS — E785 Hyperlipidemia, unspecified: Secondary | ICD-10-CM | POA: Diagnosis not present

## 2019-06-10 DIAGNOSIS — I890 Lymphedema, not elsewhere classified: Secondary | ICD-10-CM | POA: Diagnosis not present

## 2019-06-10 NOTE — Progress Notes (Addendum)
TYREA, FROBERG ([id number]) Visit Report for 06/10/2019 Chief Complaint Document Details Patient Name: [name], [name]. Date of Service: 06/10/2019 11:00 AM Medical Record Number: [id number] Patient Account Number: [id number] Date of Birth/Sex: [birth year] (74 y.o. F) Treating RN: Montey Hora Primary Care Provider: Lelon Huh Other Clinician: Referring Provider: Lelon Huh Treating Provider/Extender: Melburn Hake, Beatric Fulop Weeks in Treatment: 8 Information Obtained from: Patient Chief Complaint Patient seen for complaints of Non-Healing Wounds to both lower extremities Electronic Signature(s) Signed: 06/10/2019 11:01:36 AM By: Worthy Keeler PA-C Entered By: Worthy Keeler on 06/10/2019 11:01:36 [name] ([id number]) -------------------------------------------------------------------------------- HPI Details Patient Name: [name]. Date of Service: 06/10/2019 11:00 AM Medical Record Number: [id number] Patient Account Number: [id number] Date of Birth/Sex: [birth year] (74 y.o. F) Treating RN: Montey Hora Primary Care Provider: Lelon Huh Other Clinician: Referring Provider: Lelon Huh Treating Provider/Extender: Melburn Hake, Rachit Grim Weeks in Treatment: 8 History of Present Illness Location: both lower extremity swelling with ulceration HPI Description: 74 year old patient who sees her PCP Dr. Lelon Huh was recently evaluated 10 days ago for diabetes mellitus, hypertension, CHF and hyperlipidemia. she also was noted to have ulcerations develop in her legs and she has been applying Silvadene dressings locally. In the past she has refused wound care referrals.her cardiologist Dr. Saunders Revel saw her and put her on 40 mg of furosemide daily. last hemoglobin A1c was 7.7%. she was also placed on ciprofloxacin twice daily for 7 days and a urine culture was recommended. past medical history significant for coronary artery disease, diabetes mellitus, nonischemic cardiomyopathy and  pulmonary hypertension. She is also status post heart catheterization and coronary angiography, tubal ligation and breast cyst removal in the past. She has never been a smoker. Patient had arterial studies done which showed bilateral ABIs are artificially elevated due to noncompressible and calcified vessels. Triphasic waveform throughout. Right great toe TBI is elevated while the left is normal. 10/23/2016 -- the patient is rather moribund from several issues including chronic back pain and knee pain and swelling of her legs. The large necrotic area on her left lateral anterior calf was bleeding on touch after washing her leg. There was a spot which needed silver nitrate cauterization and this was done appropriately. 11/06/2016 -- she is again noted to have friable bleeding from the left lower extremity wounds and this again had to be cauterized with silver nitrate to control the bleeding as pressure itself would not do it. 11/20/2016 -- the right lower extremity is completely healed and we have ordered 30-40 mm compression stocking's in both the dural layer and also a pair of juxta lites. 12/18/2016 -- she has not been wearing her compression stockings on her right leg and these have opened out into ulcerations again. Her left lower extremity has circumferential ulcerations now. I believe at this stage I would like to get her venous reflux studies done to make certain that there is no fixable superficial venous reflux. 12/29/2016 -- she has made an excellent recovery having continued with her appropriate doses of diuretics and elevation and exercise. She has not yet received her right lower extremity juxta lites. Her venous duplex study is at the end of January 01/13/16 on evaluation today patient's wounds appeared to be overall doing much better much less hyper granular than previous week's evaluation. The Hydrofera Blue Dressing's to be doing very well. She does tell me that she is having a  little bit more discomfort in the posterior aspect of her leg where she has a wound  at this point. Fortunately there appears to be no infection. 01/19/17 on evaluation today patient appears to be doing very well in regard to her bilateral lower extremity swelling and at this point in time her left lower extremity ulcers. Her wounds appear to be doing much better. She has been tolerating the dressing's at this point fortunately she did get the Juxta-Lite compression as of today as well. Nonetheless I am pleased that she has been tolerating everything so well and that her wounds looks so good. In fact she is an excellent granular surface no evidence of slough covering and I do not see any reason for likely debridement today. Especially in regard to the posterior leg. 01/26/17 on evaluation today patient appears to be doing decently well in regard to her wounds. The surface of the majority of her wounds is greatly improved. She has been using the Juxta-Lite compression which is excellent. Nonetheless the wound of the left posterior calf did require some debridement today otherwise the majority of the wounds did not require any debridement. The this was due to slough buildup on the surface. She also has a small skin flap where two of the ulcers actually connected and this has loosened up I'm concerned that things may not heal well with that flap but for the time being I'm not gonna do anything different in that regard. 02/23/17 on evaluation today patient's wound actually appears to be doing fairly well in regard to the remaining left lateral ulcer which we have been treated. With that being said it does appear that her wrap actually slid down the wood bit over the past few days at least that there's actually some friction injury and blistering noted on the medial aspect of her malleolus of the left lower extremity. She continues to use the Juxta- Lite for the right lower extremity. With that being said no  debridement is necessary today and these are very superficial injuries as far as the new injuries are concerned. 03/02/17 on evaluation today patient appears to be doing excellent in regard to her left lateral lower from the ulcers. In fact it appears that she is almost completely healed in regard to the ulcer we have been following and the new injuries which were caused by the wraps slipping down last week have been completely resolved which is excellent news. Overall I'm definitely pleased with the progress that she has made. Fortunately the rat did not cause any irritation as it did previously with the Unna wrap. 03/09/17 on evaluation today patient's wound appears to be completely healed. Obviously this is great news. She and her husband both are extremely pleased and excited to finally have this gone. Obviously she has been dealing with this wound for quite some time. Readmission: 04/14/2019 upon evaluation today patient appears for reevaluation here in our clinic although it has been since March 2019 since I last saw her. She unfortunately is having issues similar to what she had previous which are blisters and ulcerations of the bilateral lower extremities. Fortunately there is no evidence of active infection at this time based on what I am seeing. Unfortunately she is still having a lot of drainage and weeping. I do believe she needs to be compression wrap in order to get this under control and she is previously tolerated a 3 layer compression wrap without any complications whatsoever. No fevers, chills, nausea, vomiting, or diarrhea. EESHA, SCHMALTZ ([id number]) 04/28/2019 upon evaluation today patient appears to be doing well with regard to the  bilateral lower extremities at this time. She has been tolerating the dressing changes without complication. Fortunately there is no signs of active infection at this time. No fevers, chills, nausea, vomiting, or diarrhea. 05/05/2019 upon evaluation today  patient appears to be doing excellent in regard to her legs. She has a lot of new skin growth and overall seems to be progressing quite nicely. Fortunately there is no signs of active infection at this time. No fevers, chills, nausea, vomiting, or diarrhea. 05/12/2019 upon evaluation today patient appears to be doing very well with regard to her lower extremities. She is not completely healed at this point but nonetheless he is doing much better even compared to last week. I am very pleased with where things stand. No fevers, chills, nausea, vomiting, or diarrhea. 05/26/2019 upon evaluation today patient actually appears to be doing worse with regard to her lower extremities. She has green drainage unfortunately is having more pain on the right lower extremity as well. Fortunately there is no signs of active infection systemically which is good news. 06/03/2019 upon evaluation today patient appears to be doing about the same in regard to the fact that she still has wounds on both lower extremities although all wounds are measuring better and appearing much better. She finally did start taking the medication which has made a big difference for her that is the Levaquin. Unfortunately this appears to be making her urinate much more frequently. She has been taking this in the evening therefore it is keeping her up at night. They are wondering if they can switch to taking this during the day. I would actually never instructed that they had to take this at night and I think it is definitely fine for her to take it in the morning. She will start that tomorrow. 06/10/2019 upon evaluation today patient appears to be doing excellent in regard to her lower extremities currently. The right lower extremity actually appears to be pretty much healed based on what I am seeing I do not see anything open or draining. The left lower extremity though not completely healed does appear to be doing much better which is great  news. Overall there is no signs of active infection at this time. Electronic Signature(s) Signed: 06/10/2019 1:10:12 PM By: Worthy Keeler PA-C Entered By: Worthy Keeler on 06/10/2019 13:10:11 [name] ([id number]) -------------------------------------------------------------------------------- Physical Exam Details Patient Name: [name], Jillian B. Date of Service: 06/10/2019 11:00 AM Medical Record Number: [id number] Patient Account Number: [id number] Date of Birth/Sex: 05/15/1945 (74 y.o. F) Treating RN: Montey Hora Primary Care Provider: Lelon Huh Other Clinician: Referring Provider: Lelon Huh Treating Provider/Extender: Melburn Hake, Bridey Brookover Weeks in Treatment: 8 Constitutional Well-nourished and well-hydrated in no acute distress. Respiratory normal breathing without difficulty. Psychiatric this patient is able to make decisions and demonstrates good insight into disease process. Alert and Oriented x 3. pleasant and cooperative. Notes Upon inspection again patient had 1 area on the left lower extremity which is still open and draining the right lower extremity is completely closed though there is a lot of dry skin overall I feel like she is doing excellent and has made great progress. Electronic Signature(s) Signed: 06/10/2019 1:10:37 PM By: Worthy Keeler PA-C Entered By: Worthy Keeler on 06/10/2019 13:10:36 [name] ([id number]) -------------------------------------------------------------------------------- Physician Orders Details Patient Name: [name] Date of Service: 06/10/2019 11:00 AM Medical Record Number: [id number] Patient Account Number: [id number] Date of Birth/Sex: 1945-09-09 (74 y.o. F) Treating RN: Montey Hora Primary  Care Provider: Lelon Huh Other Clinician: Referring Provider: Lelon Huh Treating Provider/Extender: Melburn Hake, Safari Cinque Weeks in Treatment: 8 Verbal / Phone Orders: No Diagnosis Coding ICD-10 Coding Code  Description E11.622 Type 2 diabetes mellitus with other skin ulcer I89.0 Lymphedema, not elsewhere classified L97.212 Non-pressure chronic ulcer of right calf with fat layer exposed L97.222 Non-pressure chronic ulcer of left calf with fat layer exposed Wound Cleansing Wound #17 Left,Circumferential Lower Leg o Dial antibacterial soap, wash wounds, rinse and pat dry prior to dressing wounds - HH nurse and in office o May shower with protection. - Do not get wraps wet Primary Wound Dressing Wound #17 Left,Circumferential Lower Leg o Silver Alginate Secondary Dressing Wound #17 Left,Circumferential Lower Leg o ABD pad Dressing Change Frequency Wound #17 Left,Circumferential Lower Leg o Change dressing every week Follow-up Appointments Wound #17 Left,Circumferential Lower Leg o Return Appointment in 1 week. o Nurse Visit as needed - patient will call Edema Control Wound #17 Left,Circumferential Lower Leg o 3 Layer Compression System - Bilateral o Elevate legs to the level of the heart and pump ankles as often as possible Electronic Signature(s) Signed: 06/10/2019 2:05:26 PM By: Montey Hora Signed: 06/10/2019 3:18:05 PM By: Worthy Keeler PA-C Entered By: Montey Hora on 06/10/2019 11:24:30 [name] ([id number]) -------------------------------------------------------------------------------- Problem List Details Patient Name: RANEEN, JAFFER B. Date of Service: 06/10/2019 11:00 AM Medical Record Number: [id number] Patient Account Number: [id number] Date of Birth/Sex: [birth year] (74 y.o. F) Treating RN: Montey Hora Primary Care Provider: Lelon Huh Other Clinician: Referring Provider: Lelon Huh Treating Provider/Extender: Melburn Hake, Alijah Akram Weeks in Treatment: 8 Active Problems ICD-10 Encounter Code Description Active Date MDM Diagnosis E11.622 Type 2 diabetes mellitus with other skin ulcer 04/14/2019 No Yes I89.0 Lymphedema, not elsewhere  classified 04/14/2019 No Yes L97.212 Non-pressure chronic ulcer of right calf with fat layer exposed 04/14/2019 No Yes L97.222 Non-pressure chronic ulcer of left calf with fat layer exposed 04/14/2019 No Yes Inactive Problems Resolved Problems Electronic Signature(s) Signed: 06/10/2019 11:01:28 AM By: Worthy Keeler PA-C Entered By: Worthy Keeler on 06/10/2019 11:01:27 [name] ([id number]) -------------------------------------------------------------------------------- Progress Note Details Patient Name: Jillian Albe B. Date of Service: 06/10/2019 11:00 AM Medical Record Number: [id number] Patient Account Number: [id number] Date of Birth/Sex: 1945/06/12 (74 y.o. F) Treating RN: Montey Hora Primary Care Provider: Lelon Huh Other Clinician: Referring Provider: Lelon Huh Treating Provider/Extender: Melburn Hake, Johanan Skorupski Weeks in Treatment: 8 Subjective Chief Complaint Information obtained from Patient Patient seen for complaints of Non-Healing Wounds to both lower extremities History of Present Illness (HPI) The following HPI elements were documented for the patient's wound: Location: both lower extremity swelling with ulceration 74 year old patient who sees her PCP Dr. Lelon Huh was recently evaluated 10 days ago for diabetes mellitus, hypertension, CHF and hyperlipidemia. she also was noted to have ulcerations develop in her legs and she has been applying Silvadene dressings locally. In the past she has refused wound care referrals.her cardiologist Dr. Saunders Revel saw her and put her on 40 mg of furosemide daily. last hemoglobin A1c was 7.7%. she was also placed on ciprofloxacin twice daily for 7 days and a urine culture was recommended. past medical history significant for coronary artery disease, diabetes mellitus, nonischemic cardiomyopathy and pulmonary hypertension. She is also status post heart catheterization and coronary angiography, tubal ligation and breast cyst removal  in the past. She has never been a smoker. Patient had arterial studies done which showed bilateral ABIs are artificially elevated due to noncompressible and calcified vessels. Triphasic  waveform throughout. Right great toe TBI is elevated while the left is normal. 10/23/2016 -- the patient is rather moribund from several issues including chronic back pain and knee pain and swelling of her legs. The large necrotic area on her left lateral anterior calf was bleeding on touch after washing her leg. There was a spot which needed silver nitrate cauterization and this was done appropriately. 11/06/2016 -- she is again noted to have friable bleeding from the left lower extremity wounds and this again had to be cauterized with silver nitrate to control the bleeding as pressure itself would not do it. 11/20/2016 -- the right lower extremity is completely healed and we have ordered 30-40 mm compression stocking's in both the dural layer and also a pair of juxta lites. 12/18/2016 -- she has not been wearing her compression stockings on her right leg and these have opened out into ulcerations again. Her left lower extremity has circumferential ulcerations now. I believe at this stage I would like to get her venous reflux studies done to make certain that there is no fixable superficial venous reflux. 12/29/2016 -- she has made an excellent recovery having continued with her appropriate doses of diuretics and elevation and exercise. She has not yet received her right lower extremity juxta lites. Her venous duplex study is at the end of January 01/13/16 on evaluation today patient's wounds appeared to be overall doing much better much less hyper granular than previous week's evaluation. The Hydrofera Blue Dressing's to be doing very well. She does tell me that she is having a little bit more discomfort in the posterior aspect of her leg where she has a wound at this point. Fortunately there appears to be no  infection. 01/19/17 on evaluation today patient appears to be doing very well in regard to her bilateral lower extremity swelling and at this point in time her left lower extremity ulcers. Her wounds appear to be doing much better. She has been tolerating the dressing's at this point fortunately she did get the Juxta-Lite compression as of today as well. Nonetheless I am pleased that she has been tolerating everything so well and that her wounds looks so good. In fact she is an excellent granular surface no evidence of slough covering and I do not see any reason for likely debridement today. Especially in regard to the posterior leg. 01/26/17 on evaluation today patient appears to be doing decently well in regard to her wounds. The surface of the majority of her wounds is greatly improved. She has been using the Juxta-Lite compression which is excellent. Nonetheless the wound of the left posterior calf did require some debridement today otherwise the majority of the wounds did not require any debridement. The this was due to slough buildup on the surface. She also has a small skin flap where two of the ulcers actually connected and this has loosened up I'm concerned that things may not heal well with that flap but for the time being I'm not gonna do anything different in that regard. 02/23/17 on evaluation today patient's wound actually appears to be doing fairly well in regard to the remaining left lateral ulcer which we have been treated. With that being said it does appear that her wrap actually slid down the wood bit over the past few days at least that there's actually some friction injury and blistering noted on the medial aspect of her malleolus of the left lower extremity. She continues to use the Juxta- Lite for the right  lower extremity. With that being said no debridement is necessary today and these are very superficial injuries as far as the new injuries are concerned. 03/02/17 on evaluation  today patient appears to be doing excellent in regard to her left lateral lower from the ulcers. In fact it appears that she is almost completely healed in regard to the ulcer we have been following and the new injuries which were caused by the wraps slipping down last week have been completely resolved which is excellent news. Overall I'm definitely pleased with the progress that she has made. Fortunately the rat did not cause any irritation as it did previously with the Unna wrap. 03/09/17 on evaluation today patient's wound appears to be completely healed. Obviously this is great news. She and her husband both are extremely pleased and excited to finally have this gone. Obviously she has been dealing with this wound for quite some time. Readmission: [name], [name] ([id number]) 04/14/2019 upon evaluation today patient appears for reevaluation here in our clinic although it has been since March 2019 since I last saw her. She unfortunately is having issues similar to what she had previous which are blisters and ulcerations of the bilateral lower extremities. Fortunately there is no evidence of active infection at this time based on what I am seeing. Unfortunately she is still having a lot of drainage and weeping. I do believe she needs to be compression wrap in order to get this under control and she is previously tolerated a 3 layer compression wrap without any complications whatsoever. No fevers, chills, nausea, vomiting, or diarrhea. 04/28/2019 upon evaluation today patient appears to be doing well with regard to the bilateral lower extremities at this time. She has been tolerating the dressing changes without complication. Fortunately there is no signs of active infection at this time. No fevers, chills, nausea, vomiting, or diarrhea. 05/05/2019 upon evaluation today patient appears to be doing excellent in regard to her legs. She has a lot of new skin growth and overall seems to be progressing quite  nicely. Fortunately there is no signs of active infection at this time. No fevers, chills, nausea, vomiting, or diarrhea. 05/12/2019 upon evaluation today patient appears to be doing very well with regard to her lower extremities. She is not completely healed at this point but nonetheless he is doing much better even compared to last week. I am very pleased with where things stand. No fevers, chills, nausea, vomiting, or diarrhea. 05/26/2019 upon evaluation today patient actually appears to be doing worse with regard to her lower extremities. She has green drainage unfortunately is having more pain on the right lower extremity as well. Fortunately there is no signs of active infection systemically which is good news. 06/03/2019 upon evaluation today patient appears to be doing about the same in regard to the fact that she still has wounds on both lower extremities although all wounds are measuring better and appearing much better. She finally did start taking the medication which has made a big difference for her that is the Levaquin. Unfortunately this appears to be making her urinate much more frequently. She has been taking this in the evening therefore it is keeping her up at night. They are wondering if they can switch to taking this during the day. I would actually never instructed that they had to take this at night and I think it is definitely fine for her to take it in the morning. She will start that tomorrow. 06/10/2019 upon evaluation today patient  appears to be doing excellent in regard to her lower extremities currently. The right lower extremity actually appears to be pretty much healed based on what I am seeing I do not see anything open or draining. The left lower extremity though not completely healed does appear to be doing much better which is great news. Overall there is no signs of active infection at this time. Objective Constitutional Well-nourished and well-hydrated in no acute  distress. Vitals Time Taken: 11:03 AM, Height: 64 in, Weight: 206 lbs, BMI: 35.4, Temperature: 97.8 F, Pulse: 66 bpm, Respiratory Rate: 18 breaths/min, Blood Pressure: 157/70 mmHg. Respiratory normal breathing without difficulty. Psychiatric this patient is able to make decisions and demonstrates good insight into disease process. Alert and Oriented x 3. pleasant and cooperative. General Notes: Upon inspection again patient had 1 area on the left lower extremity which is still open and draining the right lower extremity is completely closed though there is a lot of dry skin overall I feel like she is doing excellent and has made great progress. Integumentary (Hair, Skin) Wound #14 status is Healed - Epithelialized. Original cause of wound was Blister. The wound is located on the Right,Anterior Lower Leg. The wound measures 0cm length x 0cm width x 0cm depth; 0cm^2 area and 0cm^3 volume. The wound is limited to skin breakdown. There is a none present amount of drainage noted. The wound margin is flat and intact. There is no granulation within the wound bed. There is no necrotic tissue within the wound bed. Wound #17 status is Open. Original cause of wound was Blister. The wound is located on the Left,Circumferential Lower Leg. The wound measures 1cm length x 1.5cm width x 0.1cm depth; 1.178cm^2 area and 0.118cm^3 volume. The wound is limited to skin breakdown. There is no tunneling or undermining noted. There is a none present amount of drainage noted. The wound margin is flat and intact. There is no granulation within the wound bed. There is no necrotic tissue within the wound bed. 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 There was a Three Layer Compression Therapy Procedure by Montey Hora, RN. Post procedure Diagnosis Wound #17: Same as Pre-Procedure Plan Wound Cleansing: Wound #17 Left,Circumferential Lower Leg: Dial antibacterial soap, wash wounds, rinse and pat dry prior to dressing wounds - HH nurse and in office May shower with protection. - Do not get wraps wet Primary Wound Dressing: Wound #17 Left,Circumferential Lower Leg: Silver Alginate Secondary Dressing: Wound #17 Left,Circumferential Lower Leg: ABD pad Dressing Change Frequency: Wound #17 Left,Circumferential Lower Leg: Change dressing every week Follow-up Appointments: Wound #17 Left,Circumferential Lower Leg: Return Appointment in 1 week. Nurse Visit as needed - patient will call Edema Control: Wound #17 Left,Circumferential Lower Leg: 3 Layer Compression System - Bilateral Elevate legs to the level of the heart and pump ankles as often as possible 1. I would recommend currently that we go ahead and continue with the wound care measures as before and the patient is in agreement with the plan this is going to be including the compression wraps and right now we are using a 3 layer compression wrap bilaterally and on the left lower extremity where she still has some  open wound locations we will use the silver alginate dressing. 2. I do recommend she continue to elevate her legs as much as possible. 3. I would also recommend the patient continue to monitor for any signs of worsening infection she is almost done with her antibiotic I do not think she needs any additional unless anything changes or worsens. We will see patient back for reevaluation in 1 week here in the clinic. If anything worsens or changes patient will contact our office for additional recommendations. Electronic Signature(s) Signed: 06/10/2019 1:11:16 PM By: Worthy Keeler  PA-C Entered By: Worthy Keeler on 06/10/2019 13:11:15 [name] ([id number]) -------------------------------------------------------------------------------- [facility] Details Patient Name: [name] Date of Service: 06/10/2019 Medical Record Number: [id number] Patient Account Number: [id number] Date of Birth/Sex: [birth year] (74 y.o. F) Treating RN: Montey Hora Primary Care Provider: Lelon Huh Other Clinician: Referring Provider: Lelon Huh Treating Provider/Extender: Melburn Hake, Marykathleen Russi Weeks in Treatment: 8 Diagnosis Coding ICD-10 Codes Code Description E11.622 Type 2 diabetes mellitus with other skin ulcer I89.0 Lymphedema, not elsewhere classified L97.212 Non-pressure chronic ulcer of right calf with fat layer exposed L97.222 Non-pressure chronic ulcer of left calf with fat layer exposed Facility Procedures CPT4: Description Modifier Quantity Code [id number] 24401 BILATERAL: Application of multi-layer venous compression system; leg (below knee), including 1 ankle and foot. Physician Procedures CPT4 Code: 0272536 Description: 64403 - WC PHYS LEVEL 3 - EST PT Modifier: Quantity: 1 CPT4 Code: Description: ICD-10 Diagnosis Description E11.622 Type 2 diabetes mellitus with other skin ulcer I89.0 Lymphedema, not elsewhere classified L97.212 Non-pressure chronic ulcer of right calf with fat layer exposed L97.222 Non-pressure chronic ulcer of left  calf with fat layer exposed Modifier: Quantity: Electronic Signature(s) Signed: 06/10/2019 1:11:28 PM By: Worthy Keeler PA-C Entered By: Worthy Keeler on 06/10/2019 13:11:28

## 2019-06-10 NOTE — Progress Notes (Signed)
YARENI, CREPS ([id number]) Visit Report for 06/10/2019 Arrival Information Details Patient Name: [name], [name]. Date of Service: 06/10/2019 11:00 AM Medical Record Number: [id number] Patient Account Number: [id number] Date of Birth/Sex: 06/09/1945 (74 y.o. F) Treating RN: Montey Hora Primary Care Sallie Maker: Lelon Huh Other Clinician: Referring Kerina Simoneau: Lelon Huh Treating Meela Wareing/Extender: Melburn Hake, HOYT Weeks in Treatment: 8 Visit Information History Since Last Visit Added or deleted any medications: No Patient Arrived: Wheel Chair Any new allergies or adverse reactions: No Arrival Time: 10:54 Had a fall or experienced change in No Accompanied By: husband activities of daily living that may affect Transfer Assistance: Manual risk of falls: Patient Identification Verified: Yes Signs or symptoms of abuse/neglect since last visito No Secondary Verification Process Completed: Yes Hospitalized since last visit: No Implantable device outside of the clinic excluding No cellular tissue based products placed in the center since last visit: Has Dressing in Place as Prescribed: Yes Has Compression in Place as Prescribed: Yes Pain Present Now: No Electronic Signature(s) Signed: 06/10/2019 2:05:26 PM By: Montey Hora Entered By: Montey Hora on 06/10/2019 10:57:21 [name] ([id number]) -------------------------------------------------------------------------------- Compression Therapy Details Patient Name: Jillian Albe B. Date of Service: 06/10/2019 11:00 AM Medical Record Number: [id number] Patient Account Number: [id number] Date of Birth/Sex: Nov 14, 1945 (74 y.o. F) Treating RN: Montey Hora Primary Care Aften Lipsey: Lelon Huh Other Clinician: Referring Charliee Krenz: Lelon Huh Treating Ziya Coonrod/Extender: Melburn Hake, HOYT Weeks in Treatment: 8 Compression Therapy Performed for Wound Assessment: Wound #17 Left,Circumferential Lower Leg Performed By: Clinician  Montey Hora, RN Compression Type: Three Layer Post Procedure Diagnosis Same as Pre-procedure Electronic Signature(s) Signed: 06/10/2019 2:05:26 PM By: Montey Hora Entered By: Montey Hora on 06/10/2019 11:23:57 [name] ([id number]) -------------------------------------------------------------------------------- Encounter Discharge Information Details Patient Name: [name], [name] Date of Service: 06/10/2019 11:00 AM Medical Record Number: [id number] Patient Account Number: [id number] Date of Birth/Sex: 1945/01/11 (74 y.o. F) Treating RN: Montey Hora Primary Care Gee Habig: Lelon Huh Other Clinician: Referring Burlene Montecalvo: Lelon Huh Treating Lenetta Piche/Extender: Melburn Hake, HOYT Weeks in Treatment: 8 Encounter Discharge Information Items Discharge Condition: Stable Ambulatory Status: Wheelchair Discharge Destination: Home Transportation: Private Auto Accompanied By: husband Schedule Follow-up Appointment: Yes Clinical Summary of Care: Electronic Signature(s) Signed: 06/10/2019 2:05:26 PM By: Montey Hora Entered By: Montey Hora on 06/10/2019 11:25:45 [name] ([id number]) -------------------------------------------------------------------------------- Lower Extremity Assessment Details Patient Name: Watson, Jillian B. Date of Service: 06/10/2019 11:00 AM Medical Record Number: [id number] Patient Account Number: [id number] Date of Birth/Sex: 04/06/1945 (74 y.o. F) Treating RN: Montey Hora Primary Care Makaylie Dedeaux: Lelon Huh Other Clinician: Referring Randy Whitener: Lelon Huh Treating Asuna Peth/Extender: Melburn Hake, HOYT Weeks in Treatment: 8 Edema Assessment Assessed: [Left: No] [Right: No] Edema: [Left: Yes] [Right: Yes] Calf Left: Right: Point of Measurement: 30 cm From Medial Instep 42 cm 40 cm Ankle Left: Right: Point of Measurement: 10 cm From Medial Instep 24 cm 22 cm Vascular Assessment Pulses: Dorsalis Pedis Palpable: [Left:Yes]  [Right:Yes] Electronic Signature(s) Signed: 06/10/2019 2:05:26 PM By: Montey Hora Entered By: Montey Hora on 06/10/2019 11:05:42 [name] ([id number]) -------------------------------------------------------------------------------- Multi Wound Chart Details Patient Name: Jillian Albe B. Date of Service: 06/10/2019 11:00 AM Medical Record Number: [id number] Patient Account Number: [id number] Date of Birth/Sex: 1945-11-01 (74 y.o. F) Treating RN: Montey Hora Primary Care Alexandar Weisenberger: Lelon Huh Other Clinician: Referring Vernisha Bacote: Lelon Huh Treating Duilio Heritage/Extender: Melburn Hake, HOYT Weeks in Treatment: 8 Vital Signs Height(in): 64 Pulse(bpm): 66 Weight(lbs): 206 Blood Pressure(mmHg): 157/70 Body Mass Index(BMI): 35 Temperature(F): 97.8 Respiratory Rate(breaths/min): 18 Photos: [N/A:N/A] Wound Location:  Right, Anterior Lower Leg Left, Circumferential Lower Leg N/A Wounding Event: Blister Blister N/A Primary Etiology: Venous Leg Ulcer Venous Leg Ulcer N/A Secondary Etiology: Diabetic Wound/Ulcer of the Lower Diabetic Wound/Ulcer of the Lower N/A Extremity Extremity Comorbid History: Lymphedema, Asthma, Congestive Lymphedema, Asthma, Congestive N/A Heart Failure, Coronary Artery Heart Failure, Coronary Artery Disease, Hypertension, Type II Disease, Hypertension, Type II Diabetes, Osteoarthritis Diabetes, Osteoarthritis Date Acquired: 03/31/2019 03/31/2019 N/A Weeks of Treatment: 8 8 N/A Wound Status: Healed - Epithelialized Open N/A Clustered Wound: Yes Yes N/A Clustered Quantity: 1 1 N/A Measurements L x W x D (cm) 0x0x0 1x1.5x0.1 N/A Area (cm) : 0 1.178 N/A Volume (cm) : 0 0.118 N/A % Reduction in Area: 100.00% 97.10% N/A % Reduction in Volume: 100.00% 97.10% N/A Classification: Full Thickness Without Exposed Full Thickness Without Exposed N/A Support Structures Support Structures Exudate Amount: None Present None Present N/A Wound Margin: Flat and Intact  Flat and Intact N/A Granulation Amount: None Present (0%) None Present (0%) N/A Necrotic Amount: None Present (0%) None Present (0%) N/A Exposed Structures: Fascia: No Fascia: No N/A Fat Layer (Subcutaneous Tissue) Fat Layer (Subcutaneous Tissue) Exposed: No Exposed: No Tendon: No Tendon: No Muscle: No Muscle: No Joint: No Joint: No Bone: No Bone: No Limited to Skin Breakdown Limited to Skin Breakdown Epithelialization: Large (67-100%) Large (67-100%) N/A Treatment Notes Electronic Signature(s) Signed: 06/10/2019 2:05:26 PM By: Montey Hora Entered By: Montey Hora on 06/10/2019 11:23:33 [name] ([id number]) [name] ([id number]) -------------------------------------------------------------------------------- [place] Details Patient Name: CATIE, CHIAO B. Date of Service: 06/10/2019 11:00 AM Medical Record Number: [id number] Patient Account Number: [id number] Date of Birth/Sex: 02-26-1945 (74 y.o. F) Treating RN: Montey Hora Primary Care Amahri Dengel: Lelon Huh Other Clinician: Referring Aemon Koeller: Lelon Huh Treating Trafton Roker/Extender: Melburn Hake, HOYT Weeks in Treatment: 8 Active Inactive Orientation to the Wound Care Program Nursing Diagnoses: Knowledge deficit related to the wound healing center program Goals: Patient/caregiver will verbalize understanding of the [place] Program Date Initiated: 04/14/2019 Target Resolution Date: 05/09/2019 Goal Status: Active Interventions: Provide education on orientation to the wound center Notes: Venous Leg Ulcer Nursing Diagnoses: Potential for venous Insuffiency (use before diagnosis confirmed) Goals: Patient/caregiver will verbalize understanding of disease process and disease management Date Initiated: 04/14/2019 Target Resolution Date: 05/09/2019 Goal Status: Active Interventions: Assess peripheral edema status every visit. Notes: Wound/Skin Impairment Nursing  Diagnoses: Impaired tissue integrity Goals: Ulcer/skin breakdown will have a volume reduction of 30% by week 4 Date Initiated: 04/14/2019 Target Resolution Date: 05/09/2019 Goal Status: Active Interventions: Assess ulceration(s) every visit Notes: Electronic Signature(s) Signed: 06/10/2019 2:05:26 PM By: Montey Hora Entered By: Montey Hora on 06/10/2019 11:20:48 [name] ([id number]) -------------------------------------------------------------------------------- Pain Assessment Details Patient Name: Jillian Albe B. Date of Service: 06/10/2019 11:00 AM Medical Record Number: [id number] Patient Account Number: [id number] Date of Birth/Sex: 10-29-1945 (74 y.o. F) Treating RN: Montey Hora Primary Care Aydn Ferrara: Lelon Huh Other Clinician: Referring Mardell Suttles: Lelon Huh Treating Crew Goren/Extender: Melburn Hake, HOYT Weeks in Treatment: 8 Active Problems Location of Pain Severity and Description of Pain Patient Has Paino No Site Locations Pain Management and Medication Current Pain Management: Electronic Signature(s) Signed: 06/10/2019 2:05:26 PM By: Montey Hora Entered By: Montey Hora on 06/10/2019 10:57:32 [name] ([id number]) -------------------------------------------------------------------------------- Patient/Caregiver Education Details Patient Name: [name]. Date of Service: 06/10/2019 11:00 AM Medical Record Number: [id number] Patient Account Number: [id number] Date of Birth/Gender: Jun 07, 1945 (74 y.o. F) Treating RN: Montey Hora Primary Care Physician: Lelon Huh Other Clinician: Referring Physician: Caryn Section  DONALD Treating Physician/Extender: Worthy Keeler Weeks in Treatment: 8 Education Assessment Education Provided To: Patient and Caregiver Education Topics Provided Venous: Handouts: Other: edema management Methods: Explain/Verbal Responses: State content correctly Electronic Signature(s) Signed: 06/10/2019 2:05:26 PM By:  Montey Hora Entered By: Montey Hora on 06/10/2019 11:25:10 [name] ([id number]) -------------------------------------------------------------------------------- Wound Assessment Details Patient Name: Jillian Albe B. Date of Service: 06/10/2019 11:00 AM Medical Record Number: [id number] Patient Account Number: [id number] Date of Birth/Sex: [birth year] (74 y.o. F) Treating RN: Montey Hora Primary Care Beatrice Sehgal: Lelon Huh Other Clinician: Referring Masami Plata: Lelon Huh Treating Lukas Pelcher/Extender: Melburn Hake, HOYT Weeks in Treatment: 8 Wound Status Wound Number: 14 Primary Venous Leg Ulcer Etiology: Wound Location: Right, Anterior Lower Leg Secondary Diabetic Wound/Ulcer of the Lower Extremity Wounding Event: Blister Etiology: Date Acquired: 03/31/2019 Wound Healed - Epithelialized Weeks Of Treatment: 8 Status: Clustered Wound: Yes Comorbid Lymphedema, Asthma, Congestive Heart Failure, History: Coronary Artery Disease, Hypertension, Type II Diabetes, Osteoarthritis Photos Wound Measurements Length: (cm) 0 Width: (cm) 0 Depth: (cm) 0 Clustered Quantity: 1 Area: (cm) Volume: (cm) % Reduction in Area: 100% % Reduction in Volume: 100% Epithelialization: Large (67-100%) 0 0 Wound Description Classification: Full Thickness Without Exposed Support Structures Wound Margin: Flat and Intact Exudate Amount: None Present Foul Odor After Cleansing: No Slough/Fibrino No Wound Bed Granulation Amount: None Present (0%) Exposed Structure Necrotic Amount: None Present (0%) Fascia Exposed: No Fat Layer (Subcutaneous Tissue) Exposed: No Tendon Exposed: No Muscle Exposed: No Joint Exposed: No Bone Exposed: No Limited to Skin Breakdown Electronic Signature(s) Signed: 06/10/2019 2:05:26 PM By: Montey Hora Entered By: Montey Hora on 06/10/2019 11:23:20 [name]  ([id number]) -------------------------------------------------------------------------------- Wound Assessment Details Patient Name: Jillian Albe B. Date of Service: 06/10/2019 11:00 AM Medical Record Number: [id number] Patient Account Number: [id number] Date of Birth/Sex: 1945-07-09 (74 y.o. F) Treating RN: Montey Hora Primary Care Thamas Appleyard: Lelon Huh Other Clinician: Referring Anetha Slagel: Lelon Huh Treating Jahred Tatar/Extender: Melburn Hake, HOYT Weeks in Treatment: 8 Wound Status Wound Number: 17 Primary Venous Leg Ulcer Etiology: Wound Location: Left, Circumferential Lower Leg Secondary Diabetic Wound/Ulcer of the Lower Extremity Wounding Event: Blister Etiology: Date Acquired: 03/31/2019 Wound Open Weeks Of Treatment: 8 Status: Clustered Wound: Yes Comorbid Lymphedema, Asthma, Congestive Heart Failure, History: Coronary Artery Disease, Hypertension, Type II Diabetes, Osteoarthritis Photos Wound Measurements Length: (cm) 1 Width: (cm) 1.5 Depth: (cm) 0.1 Clustered Quantity: 1 Area: (cm) 1.178 Volume: (cm) 0.118 % Reduction in Area: 97.1% % Reduction in Volume: 97.1% Epithelialization: Large (67-100%) Tunneling: No Undermining: No Wound Description Classification: Full Thickness Without Exposed Support Structures Wound Margin: Flat and Intact Exudate Amount: None Present Foul Odor After Cleansing: No Slough/Fibrino No Wound Bed Granulation Amount: None Present (0%) Exposed Structure Necrotic Amount: None Present (0%) Fascia Exposed: No Fat Layer (Subcutaneous Tissue) Exposed: No Tendon Exposed: No Muscle Exposed: No Joint Exposed: No Bone Exposed: No Limited to Skin Breakdown Treatment Notes Wound #17 (Left, Circumferential Lower Leg) Notes scell, ABD, 3 layer bilateral JAMACIA, JESTER ([id number]) Electronic Signature(s) Signed: 06/10/2019 2:05:26 PM By: Montey Hora Entered By: Montey Hora on 06/10/2019 11:12:00 [name]  ([id number]) -------------------------------------------------------------------------------- [place] Details Patient Name: Jillian Albe B. Date of Service: 06/10/2019 11:00 AM Medical Record Number: [id number] Patient Account Number: [id number] Date of Birth/Sex: [birth year] (74 y.o. F) Treating RN: Montey Hora Primary Care Aeron Lheureux: Lelon Huh Other Clinician: Referring Keijuan Schellhase: Lelon Huh Treating Shaquilla Kehres/Extender: Melburn Hake, HOYT Weeks in Treatment: 8 Vital Signs Time Taken: 11:03 Temperature (F): 97.8 Height (in): 64 Pulse (bpm): 66  Weight (lbs): 206 Respiratory Rate (breaths/min): 18 Body Mass Index (BMI): 35.4 Blood Pressure (mmHg): 157/70 Reference Range: 80 - 120 mg / dl Electronic Signature(s) Signed: 06/10/2019 2:05:26 PM By: Montey Hora Entered By: Montey Hora on 06/10/2019 11:03:48

## 2019-06-13 ENCOUNTER — Ambulatory Visit: Payer: Medicare Other | Admitting: Urology

## 2019-06-17 ENCOUNTER — Encounter: Payer: Medicare Other | Admitting: Physician Assistant

## 2019-06-17 ENCOUNTER — Other Ambulatory Visit: Payer: Self-pay

## 2019-06-17 DIAGNOSIS — J45909 Unspecified asthma, uncomplicated: Secondary | ICD-10-CM | POA: Diagnosis not present

## 2019-06-17 DIAGNOSIS — I890 Lymphedema, not elsewhere classified: Secondary | ICD-10-CM | POA: Diagnosis not present

## 2019-06-17 DIAGNOSIS — E785 Hyperlipidemia, unspecified: Secondary | ICD-10-CM | POA: Diagnosis not present

## 2019-06-17 DIAGNOSIS — L97212 Non-pressure chronic ulcer of right calf with fat layer exposed: Secondary | ICD-10-CM | POA: Diagnosis not present

## 2019-06-17 DIAGNOSIS — E1151 Type 2 diabetes mellitus with diabetic peripheral angiopathy without gangrene: Secondary | ICD-10-CM | POA: Diagnosis not present

## 2019-06-17 DIAGNOSIS — M1990 Unspecified osteoarthritis, unspecified site: Secondary | ICD-10-CM | POA: Diagnosis not present

## 2019-06-17 DIAGNOSIS — L97222 Non-pressure chronic ulcer of left calf with fat layer exposed: Secondary | ICD-10-CM | POA: Diagnosis not present

## 2019-06-17 DIAGNOSIS — I872 Venous insufficiency (chronic) (peripheral): Secondary | ICD-10-CM | POA: Diagnosis not present

## 2019-06-17 DIAGNOSIS — I110 Hypertensive heart disease with heart failure: Secondary | ICD-10-CM | POA: Diagnosis not present

## 2019-06-17 DIAGNOSIS — I428 Other cardiomyopathies: Secondary | ICD-10-CM | POA: Diagnosis not present

## 2019-06-17 DIAGNOSIS — E11622 Type 2 diabetes mellitus with other skin ulcer: Secondary | ICD-10-CM | POA: Diagnosis not present

## 2019-06-17 DIAGNOSIS — I509 Heart failure, unspecified: Secondary | ICD-10-CM | POA: Diagnosis not present

## 2019-06-17 DIAGNOSIS — I2510 Atherosclerotic heart disease of native coronary artery without angina pectoris: Secondary | ICD-10-CM | POA: Diagnosis not present

## 2019-06-17 DIAGNOSIS — I272 Pulmonary hypertension, unspecified: Secondary | ICD-10-CM | POA: Diagnosis not present

## 2019-06-18 NOTE — Progress Notes (Signed)
DONYELL, CARRELL ([id number]) Visit Report for 06/17/2019 Arrival Information Details Patient Name: [name], [name]. Date of Service: 06/17/2019 11:15 AM Medical Record Number: [id number] Patient Account Number: [id number] Date of Birth/Sex: 1945/02/26 (74 y.o. F) Treating RN: Cornell Barman Primary Care Damaria Stofko: Lelon Huh Other Clinician: Referring Jamesetta Greenhalgh: Lelon Huh Treating Tresean Mattix/Extender: Melburn Hake, HOYT Weeks in Treatment: 9 Visit Information History Since Last Visit Added or deleted any medications: No Patient Arrived: Wheel Chair Has Dressing in Place as Prescribed: Yes Arrival Time: 10:36 Has Compression in Place as Prescribed: Yes Accompanied By: husband Pain Present Now: No Transfer Assistance: EasyPivot Patient Lift Patient Identification Verified: Yes Secondary Verification Process Completed: Yes Electronic Signature(s) Signed: 06/18/2019 5:04:05 PM By: Gretta Cool, BSN, RN, CWS, Kim RN, BSN Entered By: Gretta Cool, BSN, RN, CWS, Kim on 06/17/2019 10:40:03 [name] ([id number]) -------------------------------------------------------------------------------- Compression Therapy Details Patient Name: [name], [name]. Date of Service: 06/17/2019 11:15 AM Medical Record Number: [id number] Patient Account Number: [id number] Date of Birth/Sex: 02-Feb-1945 (74 y.o. F) Treating RN: Montey Hora Primary Care Fradel Baldonado: Lelon Huh Other Clinician: Referring Ellenie Salome: Lelon Huh Treating Tayt Moyers/Extender: Melburn Hake, HOYT Weeks in Treatment: 9 Compression Therapy Performed for Wound Assessment: Wound #17 Left,Circumferential Lower Leg Performed By: Clinician Montey Hora, RN Compression Type: Three Layer Post Procedure Diagnosis Same as Pre-procedure Electronic Signature(s) Signed: 06/17/2019 4:29:48 PM By: Montey Hora Entered By: Montey Hora on 06/17/2019 11:05:20 [name]  ([id number]) -------------------------------------------------------------------------------- Encounter Discharge Information Details Patient Name: [name], [name]. Date of Service: 06/17/2019 11:15 AM Medical Record Number: [id number] Patient Account Number: [id number] Date of Birth/Sex: 12-14-1945 (74 y.o. F) Treating RN: Montey Hora Primary Care Vinod Mikesell: Lelon Huh Other Clinician: Referring Parish Augustine: Lelon Huh Treating Leovanni Bjorkman/Extender: Melburn Hake, HOYT Weeks in Treatment: 9 Encounter Discharge Information Items Discharge Condition: Stable Ambulatory Status: Wheelchair Discharge Destination: Home Transportation: Private Auto Accompanied By: spouse Schedule Follow-up Appointment: Yes Clinical Summary of Care: Electronic Signature(s) Signed: 06/17/2019 4:29:48 PM By: Montey Hora Entered By: Montey Hora on 06/17/2019 11:07:30 [name] ([id number]) -------------------------------------------------------------------------------- Lower Extremity Assessment Details Patient Name: [name], [name]. Date of Service: 06/17/2019 11:15 AM Medical Record Number: [id number] Patient Account Number: [id number] Date of Birth/Sex: 03-30-1945 (74 y.o. F) Treating RN: Cornell Barman Primary Care Issaac Shipper: Lelon Huh Other Clinician: Referring Zayvian Mcmurtry: Lelon Huh Treating Tru Rana/Extender: Melburn Hake, HOYT Weeks in Treatment: 9 Edema Assessment Assessed: [Left: No] [Right: No] Edema: [Left: Yes] [Right: Yes] Calf Left: Right: Point of Measurement: 30 cm From Medial Instep 40.2 cm 38 cm Ankle Left: Right: Point of Measurement: 10 cm From Medial Instep 23.5 cm 22 cm Vascular Assessment Pulses: Dorsalis Pedis Palpable: [Left:Yes] [Right:Yes] Electronic Signature(s) Signed: 06/18/2019 5:04:05 PM By: Gretta Cool, BSN, RN, CWS, Kim RN, BSN Entered By: Gretta Cool, BSN, RN, CWS, Kim on 06/17/2019 10:45:35 [name]  ([id number]) -------------------------------------------------------------------------------- Multi Wound Chart Details Patient Name: [name]. Date of Service: 06/17/2019 11:15 AM Medical Record Number: [id number] Patient Account Number: [id number] Date of Birth/Sex: 18-Sep-1945 (74 y.o. 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] [N/A:N/A] Comorbid History: [17:Lymphedema, Asthma, Congestive Heart Failure, Coronary Artery Disease, Hypertension, Type II Diabetes,  Osteoarthritis] [N/A:N/A] Date Acquired: [17:03/31/2019] [N/A:N/A] Weeks of Treatment: [17:9] [N/A:N/A] Wound Status: [17:Open] [N/A:N/A] Clustered Wound: [17:Yes] [N/A:N/A] Clustered Quantity: [17:1] [N/A:N/A] Measurements L x W x D (cm) [17:0.1x0.1x0.1] [N/A:N/A] Area (cm) : [89:2.119] [N/A:N/A] Volume (cm) : [17:0.001] [N/A:N/A] % Reduction in Area: [17:100.00%] [N/A:N/A] % Reduction in Volume: [17:100.00%] [N/A:N/A] Classification: [17:Full Thickness Without Exposed Support Structures] [N/A:N/A] Exudate Amount: [17:None Present] [N/A:N/A] Wound Margin: [17:Flat and Intact] [N/A:N/A] Granulation Amount: [17:None Present (0%)] [N/A:N/A] Necrotic Amount: [17:None Present (0%)] [N/A:N/A] Exposed Structures: [17:Fascia: No Fat Layer (Subcutaneous Tissue) Exposed: No Tendon: No Muscle: No Joint: No Bone: No Limited to Skin Breakdown Large (67-100%)] [N/A:N/A  N/A] Treatment Notes Electronic Signature(s) Signed: 06/17/2019 4:29:48 PM By: Montey Hora Entered By: Montey Hora on 06/17/2019 11:04:52 [name] ([id number]) -------------------------------------------------------------------------------- [place] Details Patient Name: [name], [name]. Date of Service: 06/17/2019 11:15 AM Medical Record Number: [id number] Patient Account Number: [id number] Date of Birth/Sex: Nov 05, 1945 (74 y.o. F) Treating RN: Montey Hora Primary Care Estefanie Cornforth: Lelon Huh Other Clinician: Referring Issak Goley: Lelon Huh Treating Kathlee Barnhardt/Extender: Melburn Hake, HOYT Weeks in Treatment: 9 Active Inactive Orientation to the Wound Care Program Nursing Diagnoses: Knowledge deficit related to the wound healing center program Goals: Patient/caregiver will verbalize understanding of the [facility] Program Date Initiated: 04/14/2019 Target Resolution Date: 05/09/2019 Goal Status: Active Interventions: Provide education on orientation to the wound center Notes: Venous Leg Ulcer Nursing Diagnoses: Potential for venous Insuffiency (use before diagnosis confirmed) Goals: Patient/caregiver will verbalize understanding of disease process and disease management Date Initiated: 04/14/2019 Target Resolution Date: 05/09/2019 Goal Status: Active Interventions: Assess peripheral edema status every visit. Notes: Wound/Skin Impairment Nursing Diagnoses: Impaired tissue integrity Goals: Ulcer/skin breakdown will have a volume reduction of 30% by week 4 Date Initiated: 04/14/2019 Target Resolution Date: 05/09/2019 Goal Status: Active Interventions: Assess ulceration(s) every visit Notes: Electronic Signature(s) Signed: 06/17/2019 4:29:48 PM By: Montey Hora Entered By: Montey Hora on 06/17/2019 11:04:41 [name] ([id number]) -------------------------------------------------------------------------------- Pain Assessment  Details Patient Name: [name]. Date of Service: 06/17/2019 11:15 AM Medical Record Number: [id number] Patient Account Number: [id number] Date of Birth/Sex: 1945/01/22 (74 y.o. F) Treating RN: Cornell Barman Primary Care Margo Lama: Lelon Huh Other Clinician: Referring Jarvis Sawa: Lelon Huh Treating Deneice Wack/Extender: Melburn Hake, HOYT Weeks in Treatment: 9 Active Problems Location of Pain Severity and Description of Pain Patient Has Paino No Site Locations Pain Management and Medication Current Pain Management: Electronic Signature(s) Signed: 06/18/2019 5:04:05 PM By: Gretta Cool, BSN, RN, CWS, Kim RN, BSN Entered By: Gretta Cool, BSN, RN, CWS, Kim on 06/17/2019 10:41:23 [name] ([id number]) -------------------------------------------------------------------------------- Patient/Caregiver Education Details Patient Name: [name] Date of Service: 06/17/2019 11:15 AM Medical Record Number: [id number] Patient Account Number: [id number] Date of Birth/Gender: 15-Feb-1945 (74 y.o. F) Treating RN: Montey Hora Primary Care Physician: Lelon Huh Other Clinician: Referring Physician: Lelon Huh Treating Physician/Extender: Sharalyn Ink in Treatment: 9 Education Assessment Education Provided To: Patient and Caregiver Education Topics Provided Venous: Handouts: Other: need for ongoing compression Methods: Explain/Verbal Responses: State content correctly Electronic Signature(s) Signed: 06/17/2019 4:29:48 PM By: Montey Hora Entered By: Montey Hora on 06/17/2019 11:06:54 [name] ([id number]) -------------------------------------------------------------------------------- Wound Assessment Details Patient Name: [name], [name]. Date of Service: 06/17/2019 11:15 AM Medical Record Number: [id number] Patient Account Number: [id number] Date of Birth/Sex: Nov 08, 1945 (74 y.o. F) Treating RN: Cornell Barman Primary Care Hashir Deleeuw: Lelon Huh Other  Clinician: Referring Teng Decou: Lelon Huh Treating Dearius Hoffmann/Extender: Melburn Hake, HOYT Weeks in Treatment: 9 Wound Status Wound Number:  17 Primary Venous Leg Ulcer Etiology: Wound Location: Left, Circumferential Lower Leg Secondary Diabetic Wound/Ulcer of the Lower Extremity Wounding Event: Blister Etiology: Date Acquired: 03/31/2019 Wound Open Weeks Of Treatment: 9 Status: Clustered Wound: Yes Comorbid Lymphedema, Asthma, Congestive Heart Failure, History: Coronary Artery Disease, Hypertension, Type II Diabetes, Osteoarthritis Wound Measurements Length: (cm) 0.1 Width: (cm) 0.1 Depth: (cm) 0.1 Clustered Quantity: 1 Area: (cm) 0.008 Volume: (cm) 0.001 % Reduction in Area: 100% % Reduction in Volume: 100% Epithelialization: Large (67-100%) Tunneling: No Undermining: No Wound Description Classification: Full Thickness Without Exposed Support Structures Wound Margin: Flat and Intact Exudate Amount: None Present Foul Odor After Cleansing: No Slough/Fibrino No Wound Bed Granulation Amount: None Present (0%) Exposed Structure Necrotic Amount: None Present (0%) Fascia Exposed: No Fat Layer (Subcutaneous Tissue) Exposed: No Tendon Exposed: No Muscle Exposed: No Joint Exposed: No Bone Exposed: No Limited to Skin Breakdown Treatment Notes Wound #17 (Left, Circumferential Lower Leg) Notes ABD, 3 layer bilateral Electronic Signature(s) Signed: 06/18/2019 5:04:05 PM By: Gretta Cool, BSN, RN, CWS, Kim RN, BSN Entered By: Gretta Cool, BSN, RN, CWS, Kim on 06/17/2019 10:49:05 [name] ([id number]) -------------------------------------------------------------------------------- [place] Details Patient Name: [name]. Date of Service: 06/17/2019 11:15 AM Medical Record Number: [id number] Patient Account Number: [id number] Date of Birth/Sex: 07-Dec-1945 (74 y.o. F) Treating RN: Cornell Barman Primary Care Alexxis Mackert: Lelon Huh Other Clinician: Referring Dwan Fennel: Lelon Huh Treating Merrel Crabbe/Extender: Melburn Hake, HOYT Weeks in Treatment: 9 Vital Signs Time Taken: 10:40 Temperature (F): 98.0 Height (in): 64 Pulse (bpm): 64 Weight (lbs): 206 Respiratory Rate (breaths/min): 18 Body Mass Index (BMI): 35.4 Blood Pressure (mmHg): 166/84 Reference Range: 80 - 120 mg / dl Electronic Signature(s) Signed: 06/18/2019 5:04:05 PM By: Gretta Cool, BSN, RN, CWS, Kim RN, BSN Entered By: Gretta Cool, BSN, RN, CWS, Kim on 06/17/2019 10:41:16

## 2019-06-18 NOTE — Progress Notes (Signed)
[name] ([id number]) Visit Report for 06/17/2019 Chief Complaint Document Details Patient Name: [name], [name]. Date of Service: 06/17/2019 11:15 AM Medical Record Number: [id number] Patient Account Number: [id number] Date of Birth/Sex: October 09, 1945 (74 y.o. F) Treating RN: Montey Hora Primary Care Provider: Lelon Huh Other Clinician: Referring Provider: Lelon Huh Treating Provider/Extender: Melburn Hake, Sevanna Ballengee Weeks in Treatment: 9 Information Obtained from: Patient Chief Complaint Patient seen for complaints of Non-Healing Wounds to both lower extremities Electronic Signature(s) Signed: 06/17/2019 6:10:04 PM By: Worthy Keeler PA-C Entered By: Worthy Keeler on 06/17/2019 10:49:56 Chaudoin, Jeanice Lim ([id number]) -------------------------------------------------------------------------------- HPI Details Patient Name: [name]. Date of Service: 06/17/2019 11:15 AM Medical Record Number: [id number] Patient Account Number: [id number] Date of Birth/Sex: 17-Jun-1945 (74 y.o. F) Treating RN: Montey Hora Primary Care Provider: Lelon Huh Other Clinician: Referring Provider: Lelon Huh Treating Provider/Extender: Melburn Hake, Kawan [place] Weeks in Treatment: 9 History of Present Illness Location: both lower extremity swelling with ulceration HPI Description: 74 year old patient who sees her PCP Dr. Lelon Huh was recently evaluated 10 days ago for diabetes mellitus, hypertension, CHF and hyperlipidemia. she also was noted to have ulcerations develop in her legs and she has been applying Silvadene dressings locally. In the past she has refused wound care referrals.her cardiologist Dr. Saunders Revel saw her and put her on 40 mg of furosemide daily. last hemoglobin A1c was 7.7%. she was also placed on ciprofloxacin twice daily for 7 days and a urine culture was recommended. past medical history significant for coronary artery disease, diabetes mellitus, nonischemic cardiomyopathy and  pulmonary hypertension. She is also status post heart catheterization and coronary angiography, tubal ligation and breast cyst removal in the past. She has never been a smoker. Patient had arterial studies done which showed bilateral ABIs are artificially elevated due to noncompressible and calcified vessels. Triphasic waveform throughout. Right great toe TBI is elevated while the left is normal. 10/23/2016 -- the patient is rather moribund from several issues including chronic back pain and knee pain and swelling of her legs. The large necrotic area on her left lateral anterior calf was bleeding on touch after washing her leg. There was a spot which needed silver nitrate cauterization and this was done appropriately. 11/06/2016 -- she is again noted to have friable bleeding from the left lower extremity wounds and this again had to be cauterized with silver nitrate to control the bleeding as pressure itself would not do it. 11/20/2016 -- the right lower extremity is completely healed and we have ordered 30-40 mm compression stocking's in both the dural layer and also a pair of juxta lites. 12/18/2016 -- she has not been wearing her compression stockings on her right leg and these have opened out into ulcerations again. Her left lower extremity has circumferential ulcerations now. I believe at this stage I would like to get her venous reflux studies done to make certain that there is no fixable superficial venous reflux. 12/29/2016 -- she has made an excellent recovery having continued with her appropriate doses of diuretics and elevation and exercise. She has not yet received her right lower extremity juxta lites. Her venous duplex study is at the end of January 01/13/16 on evaluation today patient's wounds appeared to be overall doing much better much less hyper granular than previous week's evaluation. The Hydrofera Blue Dressing's to be doing very well. She does tell me that she is having a  little bit more discomfort in the posterior aspect of her leg where she has a wound  at this point. Fortunately there appears to be no infection. 01/19/17 on evaluation today patient appears to be doing very well in regard to her bilateral lower extremity swelling and at this point in time her left lower extremity ulcers. Her wounds appear to be doing much better. She has been tolerating the dressing's at this point fortunately she did get the Juxta-Lite compression as of today as well. Nonetheless I am pleased that she has been tolerating everything so well and that her wounds looks so good. In fact she is an excellent granular surface no evidence of slough covering and I do not see any reason for likely debridement today. Especially in regard to the posterior leg. 01/26/17 on evaluation today patient appears to be doing decently well in regard to her wounds. The surface of the majority of her wounds is greatly improved. She has been using the Juxta-Lite compression which is excellent. Nonetheless the wound of the left posterior calf did require some debridement today otherwise the majority of the wounds did not require any debridement. The this was due to slough buildup on the surface. She also has a small skin flap where two of the ulcers actually connected and this has loosened up I'm concerned that things may not heal well with that flap but for the time being I'm not gonna do anything different in that regard. 02/23/17 on evaluation today patient's wound actually appears to be doing fairly well in regard to the remaining left lateral ulcer which we have been treated. With that being said it does appear that her wrap actually slid down the wood bit over the past few days at least that there's actually some friction injury and blistering noted on the medial aspect of her malleolus of the left lower extremity. She continues to use the Juxta- Lite for the right lower extremity. With that being said no  debridement is necessary today and these are very superficial injuries as far as the new injuries are concerned. 03/02/17 on evaluation today patient appears to be doing excellent in regard to her left lateral lower from the ulcers. In fact it appears that she is almost completely healed in regard to the ulcer we have been following and the new injuries which were caused by the wraps slipping down last week have been completely resolved which is excellent news. Overall I'm definitely pleased with the progress that she has made. Fortunately the rat did not cause any irritation as it did previously with the Unna wrap. 03/09/17 on evaluation today patient's wound appears to be completely healed. Obviously this is great news. She and her husband both are extremely pleased and excited to finally have this gone. Obviously she has been dealing with this wound for quite some time. Readmission: 04/14/2019 upon evaluation today patient appears for reevaluation here in our clinic although it has been since March 2019 since I last saw her. She unfortunately is having issues similar to what she had previous which are blisters and ulcerations of the bilateral lower extremities. Fortunately there is no evidence of active infection at this time based on what I am seeing. Unfortunately she is still having a lot of drainage and weeping. I do believe she needs to be compression wrap in order to get this under control and she is previously tolerated a 3 layer compression wrap without any complications whatsoever. No fevers, chills, nausea, vomiting, or diarrhea. TYNEISHA, HEGEMAN ([id number]) 04/28/2019 upon evaluation today patient appears to be doing well with regard to the  bilateral lower extremities at this time. She has been tolerating the dressing changes without complication. Fortunately there is no signs of active infection at this time. No fevers, chills, nausea, vomiting, or diarrhea. 05/05/2019 upon evaluation today  patient appears to be doing excellent in regard to her legs. She has a lot of new skin growth and overall seems to be progressing quite nicely. Fortunately there is no signs of active infection at this time. No fevers, chills, nausea, vomiting, or diarrhea. 05/12/2019 upon evaluation today patient appears to be doing very well with regard to her lower extremities. She is not completely healed at this point but nonetheless he is doing much better even compared to last week. I am very pleased with where things stand. No fevers, chills, nausea, vomiting, or diarrhea. 05/26/2019 upon evaluation today patient actually appears to be doing worse with regard to her lower extremities. She has green drainage unfortunately is having more pain on the right lower extremity as well. Fortunately there is no signs of active infection systemically which is good news. 06/03/2019 upon evaluation today patient appears to be doing about the same in regard to the fact that she still has wounds on both lower extremities although all wounds are measuring better and appearing much better. She finally did start taking the medication which has made a big difference for her that is the Levaquin. Unfortunately this appears to be making her urinate much more frequently. She has been taking this in the evening therefore it is keeping her up at night. They are wondering if they can switch to taking this during the day. I would actually never instructed that they had to take this at night and I think it is definitely fine for her to take it in the morning. She will start that tomorrow. 06/10/2019 upon evaluation today patient appears to be doing excellent in regard to her lower extremities currently. The right lower extremity actually appears to be pretty much healed based on what I am seeing I do not see anything open or draining. The left lower extremity though not completely healed does appear to be doing much better which is great  news. Overall there is no signs of active infection at this time. 06/17/2019 upon evaluation today patient appears to be doing excellent in regard to her ulcers. She has been tolerating the dressing changes without complication in fact everything appears to be almost completely healed today. There is one area on the left lower extremity which appears to potentially still be weeping slightly and I think that we do need to see about wrapping her 1 more week just to make sure everything tightens up before discharge. Electronic Signature(s) Signed: 06/17/2019 5:52:16 PM By: Worthy Keeler PA-C Entered By: Worthy Keeler on 06/17/2019 17:52:16 [name] ([id number]) -------------------------------------------------------------------------------- Physical Exam Details Patient Name: MAKYIA, ERXLEBEN B. Date of Service: 06/17/2019 11:15 AM Medical Record Number: [id number] Patient Account Number: [id number] Date of Birth/Sex: 1945/03/10 (74 y.o. F) Treating RN: Montey Hora Primary Care Provider: Lelon Huh Other Clinician: Referring Provider: Lelon Huh Treating Provider/Extender: Melburn Hake, Ivis Henn[place] Weeks in Treatment: 9 Constitutional Obese and well-hydrated in no acute distress. Respiratory normal breathing without difficulty. Psychiatric this patient is able to make decisions and demonstrates good insight into disease process. Alert and Oriented x 3. pleasant and cooperative. Notes Upon inspection patient's wounds actually showed signs of good epithelization at this time there does not appear to be evidence of active infection and overall very pleased with where  things stand. Overall I think she is very close to being ready for discharge likely this will be the case next week based on what I am seeing today. I did tell her to bring her compression socks with her next week. Electronic Signature(s) Signed: 06/17/2019 5:52:48 PM By: Worthy Keeler PA-C Entered By: Worthy Keeler on  06/17/2019 17:52:48 [name] ([id number]) -------------------------------------------------------------------------------- Physician Orders Details Patient Name: [name] Date of Service: 06/17/2019 11:15 AM Medical Record Number: [id number] Patient Account Number: [id number] Date of Birth/Sex: 12-15-1945 (74 y.o. F) Treating RN: Montey Hora Primary Care Provider: Lelon Huh Other Clinician: Referring Provider: Lelon Huh Treating Provider/Extender: Melburn Hake, [place] Weeks in Treatment: 9 Verbal / Phone Orders: No Diagnosis Coding ICD-10 Coding Code Description E11.622 Type 2 diabetes mellitus with other skin ulcer I89.0 Lymphedema, not elsewhere classified L97.212 Non-pressure chronic ulcer of right calf with fat layer exposed L97.222 Non-pressure chronic ulcer of left calf with fat layer exposed Wound Cleansing Wound #17 Left,Circumferential Lower Leg o Dial antibacterial soap, wash wounds, rinse and pat dry prior to dressing wounds o May shower with protection. - Do not get wraps wet Secondary Dressing Wound #17 Left,Circumferential Lower Leg o ABD pad Dressing Change Frequency Wound #17 Left,Circumferential Lower Leg o Change dressing every week Follow-up Appointments Wound #17 Left,Circumferential Lower Leg o Return Appointment in 1 week. o Nurse Visit as needed - patient will call Edema Control Wound #17 Left,Circumferential Lower Leg o 3 Layer Compression System - Bilateral o Elevate legs to the level of the heart and pump ankles as often as possible Electronic Signature(s) Signed: 06/17/2019 4:29:48 PM By: Montey Hora Signed: 06/17/2019 6:10:04 PM By: Worthy Keeler PA-C Entered By: Montey Hora on 06/17/2019 [place], Vera. ([id number]) -------------------------------------------------------------------------------- Problem List Details Patient Name: KARNA, ABED B. Date of Service: 06/17/2019 11:15 AM Medical  Record Number: [id number] Patient Account Number: [id number] Date of Birth/Sex: [birth year] (74 y.o. F) Treating RN: Montey Hora Primary Care Provider: Lelon Huh Other Clinician: Referring Provider: Lelon Huh Treating Provider/Extender: Melburn Hake, Melis [place] Weeks in Treatment: 9 Active Problems ICD-10 Encounter Code Description Active Date MDM Diagnosis E11.622 Type 2 diabetes mellitus with other skin ulcer 04/14/2019 No Yes I89.0 Lymphedema, not elsewhere classified 04/14/2019 No Yes L97.212 Non-pressure chronic ulcer of right calf with fat layer exposed 04/14/2019 No Yes L97.222 Non-pressure chronic ulcer of left calf with fat layer exposed 04/14/2019 No Yes Inactive Problems Resolved Problems Electronic Signature(s) Signed: 06/17/2019 6:10:04 PM By: Worthy Keeler PA-C Entered By: Worthy Keeler on 06/17/2019 10:49:42 [name] ([id number]) -------------------------------------------------------------------------------- Progress Note Details Patient Name: [name] Date of Service: 06/17/2019 11:15 AM Medical Record Number: [id number] Patient Account Number: [id number] Date of Birth/Sex: June 29, 1945 (74 y.o. F) Treating RN: Montey Hora Primary Care Provider: Lelon Huh Other Clinician: Referring Provider: Lelon Huh Treating Provider/Extender: Melburn Hake, Garrell Fl[place] Weeks in Treatment: 9 Subjective Chief Complaint Information obtained from Patient Patient seen for complaints of Non-Healing Wounds to both lower extremities History of Present Illness (HPI) The following HPI elements were documented for the patient's wound: Location: both lower extremity swelling with ulceration 74 year old patient who sees her PCP Dr. Lelon Huh was recently evaluated 10 days ago for diabetes mellitus, hypertension, CHF and hyperlipidemia. she also was noted to have ulcerations develop in her legs and she has been applying Silvadene dressings locally. In the past she has  refused wound care referrals.her cardiologist Dr. Saunders Revel saw her and put her on 40 mg of  furosemide daily. last hemoglobin A1c was 7.7%. she was also placed on ciprofloxacin twice daily for 7 days and a urine culture was recommended. past medical history significant for coronary artery disease, diabetes mellitus, nonischemic cardiomyopathy and pulmonary hypertension. She is also status post heart catheterization and coronary angiography, tubal ligation and breast cyst removal in the past. She has never been a smoker. Patient had arterial studies done which showed bilateral ABIs are artificially elevated due to noncompressible and calcified vessels. Triphasic waveform throughout. Right great toe TBI is elevated while the left is normal. 10/23/2016 -- the patient is rather moribund from several issues including chronic back pain and knee pain and swelling of her legs. The large necrotic area on her left lateral anterior calf was bleeding on touch after washing her leg. There was a spot which needed silver nitrate cauterization and this was done appropriately. 11/06/2016 -- she is again noted to have friable bleeding from the left lower extremity wounds and this again had to be cauterized with silver nitrate to control the bleeding as pressure itself would not do it. 11/20/2016 -- the right lower extremity is completely healed and we have ordered 30-40 mm compression stocking's in both the dural layer and also a pair of juxta lites. 12/18/2016 -- she has not been wearing her compression stockings on her right leg and these have opened out into ulcerations again. Her left lower extremity has circumferential ulcerations now. I believe at this stage I would like to get her venous reflux studies done to make certain that there is no fixable superficial venous reflux. 12/29/2016 -- she has made an excellent recovery having continued with her appropriate doses of diuretics and elevation and exercise. She  has not yet received her right lower extremity juxta lites. Her venous duplex study is at the end of January 01/13/16 on evaluation today patient's wounds appeared to be overall doing much better much less hyper granular than previous week's evaluation. The Hydrofera Blue Dressing's to be doing very well. She does tell me that she is having a little bit more discomfort in the posterior aspect of her leg where she has a wound at this point. Fortunately there appears to be no infection. 01/19/17 on evaluation today patient appears to be doing very well in regard to her bilateral lower extremity swelling and at this point in time her left lower extremity ulcers. Her wounds appear to be doing much better. She has been tolerating the dressing's at this point fortunately she did get the Juxta-Lite compression as of today as well. Nonetheless I am pleased that she has been tolerating everything so well and that her wounds looks so good. In fact she is an excellent granular surface no evidence of slough covering and I do not see any reason for likely debridement today. Especially in regard to the posterior leg. 01/26/17 on evaluation today patient appears to be doing decently well in regard to her wounds. The surface of the majority of her wounds is greatly improved. She has been using the Juxta-Lite compression which is excellent. Nonetheless the wound of the left posterior calf did require some debridement today otherwise the majority of the wounds did not require any debridement. The this was due to slough buildup on the surface. She also has a small skin flap where two of the ulcers actually connected and this has loosened up I'm concerned that things may not heal well with that flap but for the time being I'm not gonna do anything different  in that regard. 02/23/17 on evaluation today patient's wound actually appears to be doing fairly well in regard to the remaining left lateral ulcer which we have been  treated. With that being said it does appear that her wrap actually slid down the wood bit over the past few days at least that there's actually some friction injury and blistering noted on the medial aspect of her malleolus of the left lower extremity. She continues to use the Juxta- Lite for the right lower extremity. With that being said no debridement is necessary today and these are very superficial injuries as far as the new injuries are concerned. 03/02/17 on evaluation today patient appears to be doing excellent in regard to her left lateral lower from the ulcers. In fact it appears that she is almost completely healed in regard to the ulcer we have been following and the new injuries which were caused by the wraps slipping down last week have been completely resolved which is excellent news. Overall I'm definitely pleased with the progress that she has made. Fortunately the rat did not cause any irritation as it did previously with the Unna wrap. 03/09/17 on evaluation today patient's wound appears to be completely healed. Obviously this is great news. She and her husband both are extremely pleased and excited to finally have this gone. Obviously she has been dealing with this wound for quite some time. Readmission: WINONA, SISON ([id number]) 04/14/2019 upon evaluation today patient appears for reevaluation here in our clinic although it has been since March 2019 since I last saw her. She unfortunately is having issues similar to what she had previous which are blisters and ulcerations of the bilateral lower extremities. Fortunately there is no evidence of active infection at this time based on what I am seeing. Unfortunately she is still having a lot of drainage and weeping. I do believe she needs to be compression wrap in order to get this under control and she is previously tolerated a 3 layer compression wrap without any complications whatsoever. No fevers, chills, nausea, vomiting, or  diarrhea. 04/28/2019 upon evaluation today patient appears to be doing well with regard to the bilateral lower extremities at this time. She has been tolerating the dressing changes without complication. Fortunately there is no signs of active infection at this time. No fevers, chills, nausea, vomiting, or diarrhea. 05/05/2019 upon evaluation today patient appears to be doing excellent in regard to her legs. She has a lot of new skin growth and overall seems to be progressing quite nicely. Fortunately there is no signs of active infection at this time. No fevers, chills, nausea, vomiting, or diarrhea. 05/12/2019 upon evaluation today patient appears to be doing very well with regard to her lower extremities. She is not completely healed at this point but nonetheless he is doing much better even compared to last week. I am very pleased with where things stand. No fevers, chills, nausea, vomiting, or diarrhea. 05/26/2019 upon evaluation today patient actually appears to be doing worse with regard to her lower extremities. She has green drainage unfortunately is having more pain on the right lower extremity as well. Fortunately there is no signs of active infection systemically which is good news. 06/03/2019 upon evaluation today patient appears to be doing about the same in regard to the fact that she still has wounds on both lower extremities although all wounds are measuring better and appearing much better. She finally did start taking the medication which has made a big difference for  her that is the Levaquin. Unfortunately this appears to be making her urinate much more frequently. She has been taking this in the evening therefore it is keeping her up at night. They are wondering if they can switch to taking this during the day. I would actually never instructed that they had to take this at night and I think it is definitely fine for her to take it in the morning. She will start that tomorrow. 06/10/2019  upon evaluation today patient appears to be doing excellent in regard to her lower extremities currently. The right lower extremity actually appears to be pretty much healed based on what I am seeing I do not see anything open or draining. The left lower extremity though not completely healed does appear to be doing much better which is great news. Overall there is no signs of active infection at this time. 06/17/2019 upon evaluation today patient appears to be doing excellent in regard to her ulcers. She has been tolerating the dressing changes without complication in fact everything appears to be almost completely healed today. There is one area on the left lower extremity which appears to potentially still be weeping slightly and I think that we do need to see about wrapping her 1 more week just to make sure everything tightens up before discharge. Objective Constitutional Obese and well-hydrated in no acute distress. Vitals Time Taken: 10:40 AM, Height: 64 in, Weight: 206 lbs, BMI: 35.4, Temperature: 98.0 F, Pulse: 64 bpm, Respiratory Rate: 18 breaths/min, Blood Pressure: 166/84 mmHg. Respiratory normal breathing without difficulty. Psychiatric this patient is able to make decisions and demonstrates good insight into disease process. Alert and Oriented x 3. pleasant and cooperative. General Notes: Upon inspection patient's wounds actually showed signs of good epithelization at this time there does not appear to be evidence of active infection and overall very pleased with where things stand. Overall I think she is very close to being ready for discharge likely this will be the case next week based on what I am seeing today. I did tell her to bring her compression socks with her next week. Integumentary (Hair, Skin) Wound #17 status is Open. Original cause of wound was Blister. The wound is located on the Left,Circumferential Lower Leg. The wound measures 0.1cm length x 0.1cm width x 0.1cm  depth; 0.008cm^2 area and 0.001cm^3 volume. The wound is limited to skin breakdown. There is no tunneling or undermining noted. There is a none present amount of drainage noted. The wound margin is flat and intact. There is no granulation within the wound bed. There is no necrotic tissue within the wound bed. 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 There was a Three Layer Compression Therapy Procedure by Montey Hora, RN. Post procedure Diagnosis Wound #17: Same as Pre-Procedure Plan Wound Cleansing: Wound #17 Left,Circumferential Lower Leg: Dial antibacterial soap, wash wounds, rinse and pat dry prior to dressing wounds May shower with protection. - Do not get wraps wet Secondary Dressing: Wound #17 Left,Circumferential Lower Leg: ABD pad Dressing Change Frequency: Wound #17 Left,Circumferential Lower Leg: Change dressing every week Follow-up Appointments: Wound #17 Left,Circumferential Lower Leg: Return Appointment in 1 week. Nurse Visit as needed - patient  will call Edema Control: Wound #17 Left,Circumferential Lower Leg: 3 Layer Compression System - Bilateral Elevate legs to the level of the heart and pump ankles as often as possible 1. I would recommend currently that we go ahead and continue with the wound care measures as before with regard to the compression wraps. I do believe this is the best option and again right now utilizing a 3 layer compression wrap bilaterally. 2. I am also recommend at this time that we continue with the ABD pads just to absorb anything that may drain but again I do not believe we need  any specific dressings being that there is not really any specific wound openings. We will see patient back for reevaluation in 1 week here in the clinic. If anything worsens or changes patient will contact our office for additional recommendations. Electronic Signature(s) Signed: 06/17/2019 5:53:30 PM By: Worthy Keeler PA-C Entered By: Worthy Keeler on 06/17/2019 17:53:29 Sebo, Jeanice Lim ([id number]) -------------------------------------------------------------------------------- SuperBill Details Patient Name: [name] Date of Service: 06/17/2019 Medical Record Number: [id number] Patient Account Number: [id number] Date of Birth/Sex: 01-May-1945 (74 y.o. F) Treating RN: Montey Hora Primary Care Provider: Lelon Huh Other Clinician: Referring Provider: Lelon Huh Treating Provider/Extender: Melburn Hake, Rayvn Rickerson Weeks in Treatment: 9 Diagnosis Coding ICD-10 Codes Code Description E11.622 Type 2 diabetes mellitus with other skin ulcer I89.0 Lymphedema, not elsewhere classified L97.212 Non-pressure chronic ulcer of right calf with fat layer exposed L97.222 Non-pressure chronic ulcer of left calf with fat layer exposed Facility Procedures CPT4: Description Modifier Quantity Code [id number] BILATERAL: Application of multi-layer venous compression system; leg (below knee), including 1 ankle and foot. Physician Procedures CPT4 Code: 3734287 Description: 68115 - WC PHYS LEVEL 3 - EST PT Modifier: Quantity: 1 CPT4 Code: Description: ICD-10 Diagnosis Description E11.622 Type 2 diabetes mellitus with other skin ulcer I89.0 Lymphedema, not elsewhere classified L97.212 Non-pressure chronic ulcer of right calf with fat layer exposed L97.222 Non-pressure chronic ulcer of left  calf with fat layer exposed Modifier: Quantity: Electronic Signature(s) Signed: 06/17/2019 5:53:51 PM By: Worthy Keeler PA-C Previous Signature: 06/17/2019 4:29:48 PM Version By: Montey Hora Entered  By: Worthy Keeler on 06/17/2019 17:53:51

## 2019-06-23 ENCOUNTER — Other Ambulatory Visit: Payer: Self-pay

## 2019-06-23 ENCOUNTER — Encounter: Payer: Self-pay | Admitting: Physician Assistant

## 2019-06-23 ENCOUNTER — Ambulatory Visit: Payer: Medicare Other | Admitting: Physician Assistant

## 2019-06-23 VITALS — BP 168/100 | HR 76 | Ht 64.0 in | Wt 206.0 lb

## 2019-06-23 DIAGNOSIS — E1122 Type 2 diabetes mellitus with diabetic chronic kidney disease: Secondary | ICD-10-CM

## 2019-06-23 DIAGNOSIS — N183 Chronic kidney disease, stage 3 unspecified: Secondary | ICD-10-CM

## 2019-06-23 DIAGNOSIS — I10 Essential (primary) hypertension: Secondary | ICD-10-CM

## 2019-06-23 DIAGNOSIS — I428 Other cardiomyopathies: Secondary | ICD-10-CM

## 2019-06-23 DIAGNOSIS — Z79899 Other long term (current) drug therapy: Secondary | ICD-10-CM

## 2019-06-23 DIAGNOSIS — R600 Localized edema: Secondary | ICD-10-CM

## 2019-06-23 DIAGNOSIS — I5022 Chronic systolic (congestive) heart failure: Secondary | ICD-10-CM

## 2019-06-23 DIAGNOSIS — R350 Frequency of micturition: Secondary | ICD-10-CM

## 2019-06-23 DIAGNOSIS — E785 Hyperlipidemia, unspecified: Secondary | ICD-10-CM | POA: Diagnosis not present

## 2019-06-23 DIAGNOSIS — I350 Nonrheumatic aortic (valve) stenosis: Secondary | ICD-10-CM

## 2019-06-23 DIAGNOSIS — I872 Venous insufficiency (chronic) (peripheral): Secondary | ICD-10-CM

## 2019-06-23 MED ORDER — METOPROLOL TARTRATE 50 MG PO TABS: 75.0000 mg | ORAL_TABLET | Freq: Two times a day (BID) | ORAL | 3 refills | Status: DC

## 2019-06-23 NOTE — Progress Notes (Signed)
Office Visit    Patient Name: [name] Date of Encounter: 06/23/2019  Primary Care Provider:  Birdie Sons, MD Primary Cardiologist:  Nelva Bush, MD  Chief Complaint    Chief Complaint  Patient presents with  . OTHER    1 month f/u echo c/o weakness/fatigue/elevated BP. Meds reviewed verbally with pt.    74 year old female with history of nonobstructive CAD s/p 03/07/2016 L/RHC, NICM, chronic systolic and diastolic heart failure, hypertension, hyperlipidemia, aortic stenosis, DM (Metformin currently held), multiple myeloma, and who presents for 1 month follow-up of HFpEF.  Past Medical History    Past Medical History:  Diagnosis Date  . Aortic stenosis   . Asthma   . Coronary artery disease   . Diabetes mellitus without complication ([place])   . History of measles   . Hypertension   . Nonischemic cardiomyopathy ([place])   . Pulmonary hypertension ([place])    Past Surgical History:  Procedure Laterality Date  . CARDIAC CATHETERIZATION    . Cyst(solitary) of breast:removed    . RIGHT/LEFT HEART CATH AND CORONARY ANGIOGRAPHY N/A 03/07/2016   Procedure: Right/Left Heart Cath and Coronary Angiography;  Surgeon: Nelva Bush, MD;  Location: [facility];  Service: Cardiovascular;  Laterality: N/A;  . TUBAL LIGATION      Allergies  Allergies  Allergen Reactions  . Hydrochlorothiazide     Leg Cramps  . Penicillins Rash    History of Present Illness    [name] is a 74 y.o. female with PMH as above.    She has a history of nonobstructive CAD and chronic systolic and diastolic heart failure secondary to nonischemic cardiomyopathy.  She underwent LHC/RHC 03/07/2016 as below that showed nonobstructive disease.  TTE 04/18/2019 showed EF 60 to 65% with G1DD, very mild AS, and mild pulmonary hypertension.  She was seen by her primary cardiologist in April 2021 with noted improvement in her leg swelling.  Due to concerns about urinary frequency, however,  furosemide was de-escalated to 40 mg every other day.  She was encouraged to continue working with the wound care clinic given her ongoing LEE.   She was seen again 05/23/2019 by her primary cardiologist and reported worsening lower extremity edema since changing her furosemide to 40 mg every other day.  She noted weeping present.  She was continuing to follow at the wound care center and receiving triple wraps on both legs.  She continued to be concerned regarding urinary frequency, especially when taking the furosemide.  She noted intermittent cramping in her legs, worsening with increased swelling since her last visit.  Lower extremity cramping was thought 2/2 worsening edema; however, lovastatin was held in the event of some element of statin myopathy.  Concern was noted regarding her increased volume status after every other day dosing. Following recheck of a BMET, her diuretic was increased back to daily furosemide 40 mg with discussion of possible future escalation if needed at RTC.  She was noted to remain very sedentary, unable to do most ADLs without assistance.  It was noted that she had not been advised to restart any medications for her DM by Dr. Caryn Section, given her Metformin had previously been held in the setting of AKI superimposed on CKD.  She was advised to reach out to Dr. Caryn Section regarding this Metformin.  She was also advised to continue avoiding nephrotoxic agents, especially NSAIDs.  Today, she returns to clinic and is joined by her husband.  She notes significant fatigue,  having not slept well the previous night.  She notes today however that she has been fatigued since her last visit. Per her husband, she was in the recliner chair all night and adjusting it for comfort, which interfered with her sleep.  On exam today, she frequently nods her head forward and falls asleep even during the exam.  Since increasing her Lasix back to 40 mg daily and holding her lovastatin, she has noticed an  improvement in her lower extremity myalgias.  She still feels that she is holding onto some volume, however. She continues to note concern for urinary frequency. She continues to remain sedentary and unable to perform most ADLs.  Her husband notes today that she often will become exerted just when moving from the wheelchair to the toilet and back.  She continues to follow with the wound care clinic with lower extremity edema still noted on today's exam. She denies chest pain, increased orthopnea, palpitations, n, v, dizziness, syncope, edema, or early satiety.  She denies s/sx of bleeding. Medication compliance noted. Concern for renal function noted by husband, as well as elevated BP.  Home Medications    Prior to Admission medications   Medication Sig Start Date End Date Taking? Authorizing Provider  aspirin 325 MG tablet Take 325 mg by mouth every 6 (six) hours as needed (pain).   Yes [provider]  aspirin EC 81 MG tablet Take 1 tablet (81 mg total) by mouth daily. 03/02/16  Yes Wende Bushy, MD  Cholecalciferol (VITAMIN D) 50 MCG (2000 UT) tablet Take 2,000 Units by mouth daily.   Yes [provider]  cyanocobalamin 500 MCG tablet Take 250 mcg by mouth daily.    Yes [provider]  furosemide (LASIX) 40 MG tablet Take 1 tablet (40 mg total) by mouth daily. 05/23/19  Yes End, Harrell Gave, MD  HYDROcodone-acetaminophen (NORCO) 10-325 MG tablet Take 1 tablet by mouth every 8 (eight) hours as needed. 05/31/18  Yes Birdie Sons, MD  lisinopril (ZESTRIL) 20 MG tablet Take 20 mg by mouth daily.   Yes [provider]  metoprolol tartrate (LOPRESSOR) 50 MG tablet Take 1.5 tablets (75 mg total) by mouth 2 (two) times daily. 06/23/19  Yes Marrianne Mood D, PA-C  oxybutynin (DITROPAN) 5 MG tablet Take 1 tablet (5 mg total) by mouth 2 (two) times daily. 12/18/18  Yes Festus Aloe, MD    Review of Systems    She denies chest pain, palpitations, pnd, orthopnea,  n, v, dizziness, syncope, or early satiety.  She reports SOP/DOE with minimal exertion, fatigue with poor sleep as of late, and ongoing lower extremity edema followed by wound care clinic.  She reports improving myalgias in her lower extremities s/p discontinuation of lovastatin and resumption of furosemide 40 mg daily.   All other systems reviewed and are otherwise negative except as noted above.  Physical Exam    VS:  BP (!) 168/100 (BP Location: Left Arm, Patient Position: Sitting, Cuff Size: Large)   Pulse 76   Ht _0  (1.626 m)   Wt 206 lb (93.4 kg)   SpO2 98%   BMI 35.36 kg/m  , BMI Body mass index is 35.36 kg/m. GEN: Well nourished, well developed, in no acute distress.  Seated in wheelchair.  Joined by her husband.  He is acutely fatigued. HEENT: normal.  Neck: Supple, JVP approximately 8 to 10 cm.  No,carotid bruits, or masses. Cardiac: RRR, 1/6 systolic murmur.  No rubs or gallops. No clubbing, cyanosis.  Bilateral lower extremities wrapped with wound care Ace bandage and lining.  New bandages, 1-2+ pitting edema noted of to the level of the knee.  Radials/DP/PT 2+ and equal bilaterally.  Respiratory: Poor inspiratory effort, mildly diminished breath sounds throughout.Marland Kitchen GI: Soft, nontender, nondistended, BS + x 4. MS: no deformity or atrophy. Skin: warm and dry, no rash. Neuro:  Strength and sensation are intact. Psych: Normal affect.  Accessory Clinical Findings    ECG personally reviewed by me today - NSR, 76bpm, previously noted poor R wave progression - no acute changes from 05/23/19.  VITALS Reviewed today   Temp Readings from Last 3 Encounters:  03/28/19 (!) 96.2 F (35.7 C) (Temporal)  09/27/18 (!) 96.6 F (35.9 C) (Temporal)  05/31/18 (!) 97.5 F (36.4 C) (Oral)   BP Readings from Last 3 Encounters:  06/23/19 (!) 168/100  05/23/19 132/68  04/23/19 110/60   Pulse Readings from Last 3 Encounters:  06/23/19 76  05/23/19 62  04/23/19 60    Wt Readings from  Last 3 Encounters:  06/23/19 206 lb (93.4 kg)  05/23/19 206 lb (93.4 kg)  04/23/19 206 lb (93.4 kg)     LABS  reviewed today   Lab Results  Component Value Date   WBC 6.9 03/28/2019   HGB 9.0 (L) 03/28/2019   HCT 28.4 (L) 03/28/2019   MCV 91 03/28/2019   PLT 301 03/28/2019   Lab Results  Component Value Date   CREATININE 1.64 (H) 05/23/2019   BUN 23 05/23/2019   NA 147 (H) 05/23/2019   K 4.3 05/23/2019   CL 110 (H) 05/23/2019   CO2 25 05/23/2019   Lab Results  Component Value Date   ALT 6 03/28/2019   AST 9 03/28/2019   ALKPHOS 69 03/28/2019   BILITOT 0.2 03/28/2019   Lab Results  Component Value Date   CHOL 151 09/27/2018   HDL 48 09/27/2018   LDLCALC 84 09/27/2018   TRIG 105 09/27/2018   CHOLHDL 3.1 09/27/2018    Lab Results  Component Value Date   HGBA1C 6.6 (A) 03/28/2019   Lab Results  Component Value Date   TSH 0.482 05/29/2017     STUDIES/PROCEDURES reviewed today    Cardiovascular History & Procedures: Cardiovascular Problems:  Chronic systolic and diastolic heart failure secondary to non-ischemic cardiomyopathy  Nonobstructive coronary artery disease  Aortic stenosis  Risk Factors:  Known coronary artery disease, hypertension, hyperlipidemia, diabetes mellitus, sedentary lifestyle, obesity, and age > 3  Cath/PCI:  LHC/RHC (03/07/16): LMCA normal. LAD with 30% ostial stenosi, 30%midvessel narrowing, and 50% distal disease. LCx with 40% midvessel lesion. RCA with 30% proximal narrowing. LVEDP 26 mmHg. RA 18 (prominent "M" appearance), RV 82/21, PA mean 55, PCWP 25. Ao sat 96%, PA sat 54%, Fick Co/Ci 3.2/1.7. PVR 9.4 Wood units. No significant AoV gradient.  CV Surgery:  None  EP Procedures and Devices:  None  Non-Invasive Evaluation(s):  TTE (04/18/2019): Normal LV size with mild LVH.  LVEF 60-65% with grade 1 diastolic dysfunction.  Normal RV size and function.  Mild pulmonary hypertension.  Very mild aortic stenosis.  ABI's  (07/26/16):Right:ABI notobtained, TBI 1.8. Left:ABI 1.9, TBI 1.0.Triphasic waveforms noted in both lower extremities.  TTE (07/26/16): Normal LV size with moderate LVH. LVEF 55-60% with normal wall motion. Grade 1 diastolic dysfunction. Mild aortic stenosis (mean gradient 11 mmHg). Mitral annular calcification with mild to moderate MR. Mild left atrial enlargement. Normal RV size and function. Normal PA pressure.  TTE (02/25/16): Normal LV size  with moderate LVH. LVEF 25-30% with grade 2 diastolic dysfunction. Thickened aortic valve with likely moderate stenosis. MAC with mild MR. Mild LA enlargement. Mildly to moderately reduced RV contraction. Mild-moderate TR. Moderately PH (RVSP 55 mmHg).  Assessment & Plan    Chronic HFrEF --Reports SOB/DOE with consideration of deconditioning. Most recent echo as above with EF 60-65% and G1DD.Volume exam challenging due to body habitus and wrapped LE, though bilateral pitting edema noted even through her ACE bandages.. Notes improvement in LEE with furosemide 15m daily but still reports ongoing issues with volume status and sx of LEE, DOE/SOB, and fatigue. She also notes ongoing urinary frequency as reported in the past. Given recent AKI, we will recheck a BMET with further recommendations regarding lasix at that time. For now, continue current medications, including lasix 464mdaily, increased BB to metoprolol tartrate 7587mID, and lisinopril 64m21mily.  HTN, poorly controlled --BP elevated at 168/100 with consideration of recent lack of sleep, as well as current volume status. Given room in HR today, we will increase her BB to metoprolol tartrate 75mg52m and reassess at RTC. Continue lisinopril 64mg 77my. If BMET shows improvement in renal function, could consider increasing lisinopril as well for BP control. Will defer any changes to ACE for now. Reassess at RTC. Low salt diet recommended.   AS --Very mild on most recent echo. Continue to monitor  sx.  CKD --BMET rechecked with improved renal function at last visit. She is still holding her metformin with restart per PCP. She is avoiding nephrotoxins, including NSAIDs.  Leg pain --Improved myalgias / leg pain with daily furosemide 40mg a31molding lovastatin. It is unclear if leg pain improved with holding statin or increased diuresis; therefore, will continue to hold statin for now.    DM2 --Restart of metformin per PCP.  Medication changes: Pending BMET, may increase lasix Labs ordered: BMET Studies / Imaging ordered: None Future considerations: Further titration of diuresis and antihypertensives Disposition: RTC 1 month    JacquelArvil Chaco6/21/2021

## 2019-06-23 NOTE — Patient Instructions (Signed)
Medication Instructions:  1- INCREASE Metoprolol Take 1.5 tablets (75 mg total) by mouth 2 (two) times daily *If you need a refill on your cardiac medications before your next appointment, please call your pharmacy*   Lab Work: Your physician recommends that you have lab work today(BMET)   If you have labs (blood work) drawn today and your tests are completely normal, you will receive your results only by: Marland Kitchen MyChart Message (if you have MyChart) OR . A paper copy in the mail If you have any lab test that is abnormal or we need to change your treatment, we will call you to review the results.   Testing/Procedures: None ordered    Follow-Up: At [facility] you and your health needs are our priority.  As part of our continuing mission to provide you with exceptional heart care, we have created designated Provider Care Teams.  These Care Teams include your primary Cardiologist (physician) and Advanced Practice Providers (APPs -  Physician Assistants and Nurse Practitioners) who all work together to provide you with the care you need, when you need it.  We recommend signing up for the patient portal called "MyChart".  Sign up information is provided on this After Visit Summary.  MyChart is used to connect with patients for Virtual Visits (Telemedicine).  Patients are able to view lab/test results, encounter notes, upcoming appointments, etc.  Non-urgent messages can be sent to your provider as well.   To learn more about what you can do with MyChart, go to [email or web address].    Your next appointment:   1 month(s)  The format for your next appointment:   In Person  Provider:    You may see Nelva Bush, MD or Marrianne Mood, PA-C

## 2019-06-24 ENCOUNTER — Encounter: Payer: Medicare Other | Admitting: Physician Assistant

## 2019-06-24 DIAGNOSIS — J45909 Unspecified asthma, uncomplicated: Secondary | ICD-10-CM | POA: Diagnosis not present

## 2019-06-24 DIAGNOSIS — M1990 Unspecified osteoarthritis, unspecified site: Secondary | ICD-10-CM | POA: Diagnosis not present

## 2019-06-24 DIAGNOSIS — I890 Lymphedema, not elsewhere classified: Secondary | ICD-10-CM | POA: Diagnosis not present

## 2019-06-24 DIAGNOSIS — E785 Hyperlipidemia, unspecified: Secondary | ICD-10-CM | POA: Diagnosis not present

## 2019-06-24 DIAGNOSIS — I509 Heart failure, unspecified: Secondary | ICD-10-CM | POA: Diagnosis not present

## 2019-06-24 DIAGNOSIS — L97212 Non-pressure chronic ulcer of right calf with fat layer exposed: Secondary | ICD-10-CM | POA: Diagnosis not present

## 2019-06-24 DIAGNOSIS — I2510 Atherosclerotic heart disease of native coronary artery without angina pectoris: Secondary | ICD-10-CM | POA: Diagnosis not present

## 2019-06-24 DIAGNOSIS — I110 Hypertensive heart disease with heart failure: Secondary | ICD-10-CM | POA: Diagnosis not present

## 2019-06-24 DIAGNOSIS — I428 Other cardiomyopathies: Secondary | ICD-10-CM | POA: Diagnosis not present

## 2019-06-24 DIAGNOSIS — E1151 Type 2 diabetes mellitus with diabetic peripheral angiopathy without gangrene: Secondary | ICD-10-CM | POA: Diagnosis not present

## 2019-06-24 DIAGNOSIS — E11622 Type 2 diabetes mellitus with other skin ulcer: Secondary | ICD-10-CM | POA: Diagnosis not present

## 2019-06-24 DIAGNOSIS — I272 Pulmonary hypertension, unspecified: Secondary | ICD-10-CM | POA: Diagnosis not present

## 2019-06-24 DIAGNOSIS — L97222 Non-pressure chronic ulcer of left calf with fat layer exposed: Secondary | ICD-10-CM | POA: Diagnosis not present

## 2019-06-24 LAB — BASIC METABOLIC PANEL
BUN/Creatinine Ratio: 14 (ref 12–28)
BUN: 19 mg/dL (ref 8–27)
CO2: 24 mmol/L (ref 20–29)
Calcium: 9.8 mg/dL (ref 8.7–10.3)
Chloride: 100 mmol/L (ref 96–106)
Creatinine, Ser: 1.38 mg/dL — ABNORMAL HIGH (ref 0.57–1.00)
GFR calc Af Amer: 43 mL/min/{1.73_m2} — ABNORMAL LOW (ref >59)
GFR calc non Af Amer: 37 mL/min/{1.73_m2} — ABNORMAL LOW (ref >59)
Glucose: 142 mg/dL — ABNORMAL HIGH (ref 65–99)
Potassium: 4.1 mmol/L (ref 3.5–5.2)
Sodium: 139 mmol/L (ref 134–144)

## 2019-06-24 NOTE — Progress Notes (Addendum)
Jillian, Watson ([id number]) Visit Report for 06/24/2019 Arrival Information Details Patient Name: [name], [name]. Date of Service: 06/24/2019 11:00 AM Medical Record Number: [id number] Patient Account Number: [id number] Date of Birth/Sex: 12/24/1945 (74 y.o. F) Treating RN: Montey Hora Primary Care Sharlette Jansma: Lelon Huh Other Clinician: Referring Shammara Jarrett: Lelon Huh Treating Oval Cavazos/Extender: Melburn Hake, HOYT Weeks in Treatment: 10 Visit Information History Since Last Visit Added or deleted any medications: No Patient Arrived: Wheel Chair Any new allergies or adverse reactions: No Arrival Time: 10:40 Had a fall or experienced change in No Accompanied By: husband activities of daily living that may affect Transfer Assistance: None risk of falls: Patient Identification Verified: Yes Signs or symptoms of abuse/neglect since last visito No Secondary Verification Process Completed: Yes Hospitalized since last visit: No Implantable device outside of the clinic excluding No cellular tissue based products placed in the center since last visit: Has Dressing in Place as Prescribed: Yes Pain Present Now: Yes Electronic Signature(s) Signed: 06/24/2019 11:36:00 AM By: Sandre Kitty Entered By: Sandre Kitty on 06/24/2019 10:42:01 [name] ([id number]) -------------------------------------------------------------------------------- Clinic Level of Care Assessment Details Patient Name: [name]. Date of Service: 06/24/2019 11:00 AM Medical Record Number: [id number] Patient Account Number: [id number] Date of Birth/Sex: 04-17-1945 (74 y.o. F) Treating RN: Montey Hora Primary Care Kolson Chovanec: Lelon Huh Other Clinician: Referring Payden Bonus: Lelon Huh Treating Georgi Navarrete/Extender: Melburn Hake, HOYT Weeks in Treatment: 10 Clinic Level of Care Assessment Items TOOL 4 Quantity Score []  - Use when only an EandM is performed on FOLLOW-UP visit 0 ASSESSMENTS -  Nursing Assessment / Reassessment X - Reassessment of Co-morbidities (includes updates in patient status) 1 10 X- 1 5 Reassessment of Adherence to Treatment Plan ASSESSMENTS - Wound and Skin Assessment / Reassessment X - Simple Wound Assessment / Reassessment - one wound 1 5 []  - 0 Complex Wound Assessment / Reassessment - multiple wounds []  - 0 Dermatologic / Skin Assessment (not related to wound area) ASSESSMENTS - Focused Assessment X - Circumferential Edema Measurements - multi extremities 2 5 []  - 0 Nutritional Assessment / Counseling / Intervention X- 1 5 Lower Extremity Assessment (monofilament, tuning fork, pulses) []  - 0 Peripheral Arterial Disease Assessment (using hand held doppler) ASSESSMENTS - Ostomy and/or Continence Assessment and Care []  - Incontinence Assessment and Management 0 []  - 0 Ostomy Care Assessment and Management (repouching, etc.) PROCESS - Coordination of Care X - Simple Patient / Family Education for ongoing care 1 15 []  - 0 Complex (extensive) Patient / Family Education for ongoing care X- 1 10 Staff obtains Programmer, systems, Records, Test Results / Process Orders []  - 0 Staff telephones HHA, Nursing Homes / Clarify orders / etc []  - 0 Routine Transfer to another Facility (non-emergent condition) []  - 0 Routine Hospital Admission (non-emergent condition) []  - 0 New Admissions / Biomedical engineer / Ordering NPWT, Apligraf, etc. []  - 0 Emergency Hospital Admission (emergent condition) X- 1 10 Simple Discharge Coordination []  - 0 Complex (extensive) Discharge Coordination PROCESS - Special Needs []  - Pediatric / Minor Patient Management 0 []  - 0 Isolation Patient Management []  - 0 Hearing / Language / Visual special needs []  - 0 Assessment of Community assistance (transportation, D/C planning, etc.) []  - 0 Additional assistance / Altered mentation []  - 0 Support Surface(s) Assessment (bed, cushion, seat, etc.) INTERVENTIONS - Wound  Cleansing / Measurement Lucken, Lakeyta B. ([id number]) X- 1 5 Simple Wound Cleansing - one wound []  - 0 Complex Wound Cleansing - multiple wounds X- 1 5 Wound  Imaging (photographs - any number of wounds) []  - 0 Wound Tracing (instead of photographs) X- 1 5 Simple Wound Measurement - one wound []  - 0 Complex Wound Measurement - multiple wounds INTERVENTIONS - Wound Dressings []  - Small Wound Dressing one or multiple wounds 0 []  - 0 Medium Wound Dressing one or multiple wounds []  - 0 Large Wound Dressing one or multiple wounds []  - 0 Application of Medications - topical []  - 0 Application of Medications - injection INTERVENTIONS - Miscellaneous []  - External ear exam 0 []  - 0 Specimen Collection (cultures, biopsies, blood, body fluids, etc.) []  - 0 Specimen(s) / Culture(s) sent or taken to Lab for analysis []  - 0 Patient Transfer (multiple staff / Civil Service fast streamer / Similar devices) []  - 0 Simple Staple / Suture removal (25 or less) []  - 0 Complex Staple / Suture removal (26 or more) []  - 0 Hypo / Hyperglycemic Management (close monitor of Blood Glucose) []  - 0 Ankle / Brachial Index (ABI) - do not check if billed separately X- 1 5 Vital Signs Has the patient been seen at the hospital within the last three years: Yes Total Score: 90 Level Of Care: New/Established - Level 3 Electronic Signature(s) Signed: 06/24/2019 4:36:59 PM By: Montey Hora Entered By: Montey Hora on 06/24/2019 11:08:55 [name] ([id number]) -------------------------------------------------------------------------------- Encounter Discharge Information Details Patient Name: Jillian Albe B. Date of Service: 06/24/2019 11:00 AM Medical Record Number: [id number] Patient Account Number: [id number] Date of Birth/Sex: [birth year] (74 y.o. F) Treating RN: Montey Hora Primary Care Juanisha Bautch: Lelon Huh Other Clinician: Referring Oluwatobi Visser: Lelon Huh Treating Matalynn Graff/Extender: Melburn Hake,  HOYT Weeks in Treatment: 10 Encounter Discharge Information Items Discharge Condition: Stable Ambulatory Status: Wheelchair Discharge Destination: Home Transportation: Private Auto Accompanied By: husband Schedule Follow-up Appointment: No Clinical Summary of Care: Electronic Signature(s) Signed: 06/24/2019 4:36:59 PM By: Montey Hora Entered By: Montey Hora on 06/24/2019 11:10:02 [name] ([id number]) -------------------------------------------------------------------------------- Lower Extremity Assessment Details Patient Name: RIDDHI, GRETHER B. Date of Service: 06/24/2019 11:00 AM Medical Record Number: [id number] Patient Account Number: [id number] Date of Birth/Sex: 12-Oct-1945 (74 y.o. F) Treating RN: Montey Hora Primary Care Nayelie Gionfriddo: Lelon Huh Other Clinician: Referring Sanyla Summey: Lelon Huh Treating Khamauri Bauernfeind/Extender: Melburn Hake, HOYT Weeks in Treatment: 10 Edema Assessment Assessed: [Left: No] [Right: No] Edema: [Left: Yes] [Right: Yes] Calf Left: Right: Point of Measurement: 30 cm From Medial Instep 40 cm 38.5 cm Ankle Left: Right: Point of Measurement: 10 cm From Medial Instep 23 cm 22 cm Vascular Assessment Pulses: Dorsalis Pedis Palpable: [Left:Yes] [Right:Yes] Electronic Signature(s) Signed: 06/24/2019 4:36:59 PM By: Montey Hora Entered By: Montey Hora on 06/24/2019 11:03:26 [name] ([id number]) -------------------------------------------------------------------------------- Multi Wound Chart Details Patient Name: Jillian Albe B. Date of Service: 06/24/2019 11:00 AM Medical Record Number: [id number] Patient Account Number: [id number] Date of Birth/Sex: 19-Oct-1945 (74 y.o. F) Treating RN: Montey Hora Primary Care Darrion Wyszynski: Lelon Huh Other Clinician: Referring Mykelle Cockerell: Lelon Huh Treating Kenechukwu Eckstein/Extender: Melburn Hake, HOYT Weeks in Treatment: 10 Vital Signs Height(in): 64 Pulse(bpm): 41 Weight(lbs): 206 Blood  Pressure(mmHg): 181/67 Body Mass Index(BMI): 35 Temperature(F): 97.9 Respiratory Rate(breaths/min): 18 Photos: [N/A:N/A] Wound Location: Left, Circumferential Lower Leg N/A N/A Wounding Event: Blister N/A N/A Primary Etiology: Venous Leg Ulcer N/A N/A Secondary Etiology: Diabetic Wound/Ulcer of the Lower N/A N/A Extremity Comorbid History: Lymphedema, Asthma, Congestive N/A N/A Heart Failure, Coronary Artery Disease, Hypertension, Type II Diabetes, Osteoarthritis Date Acquired: 03/31/2019 N/A N/A Weeks of Treatment: 10 N/A N/A Wound Status: Healed - Epithelialized N/A N/A Clustered Wound:  Yes N/A N/A Clustered Quantity: 1 N/A N/A Measurements L x W x D (cm) 0x0x0 N/A N/A Area (cm) : 0 N/A N/A Volume (cm) : 0 N/A N/A % Reduction in Area: 100.00% N/A N/A % Reduction in Volume: 100.00% N/A N/A Classification: Full Thickness Without Exposed N/A N/A Support Structures Exudate Amount: None Present N/A N/A Wound Margin: Flat and Intact N/A N/A Granulation Amount: None Present (0%) N/A N/A Necrotic Amount: None Present (0%) N/A N/A Exposed Structures: Fascia: No N/A N/A Fat Layer (Subcutaneous Tissue) Exposed: No Tendon: No Muscle: No Joint: No Bone: No Limited to Skin Breakdown Epithelialization: Large (67-100%) N/A N/A Treatment Notes Electronic Signature(s) Signed: 06/24/2019 4:36:59 PM By: Montey Hora Entered By: Montey Hora on 06/24/2019 11:03:51 [name] ([id number]) [name] ([id number]) -------------------------------------------------------------------------------- [place] Details Patient Name: TZIPORAH, KNOKE B. Date of Service: 06/24/2019 11:00 AM Medical Record Number: [id number] Patient Account Number: [id number] Date of Birth/Sex: 1945/03/22 (74 y.o. F) Treating RN: Montey Hora Primary Care Romney Compean: Lelon Huh Other Clinician: Referring Fabiana Dromgoole: Lelon Huh Treating Irwin Toran/Extender: Melburn Hake, HOYT Weeks in  Treatment: 10 Active Inactive Electronic Signature(s) Signed: 06/24/2019 4:36:59 PM By: Montey Hora Entered By: Montey Hora on 06/24/2019 11:03:43 [name] ([id number]) -------------------------------------------------------------------------------- Pain Assessment Details Patient Name: HENDRIX, CONSOLE B. Date of Service: 06/24/2019 11:00 AM Medical Record Number: [id number] Patient Account Number: [id number] Date of Birth/Sex: 1945-11-21 (74 y.o. F) Treating RN: Montey Hora Primary Care Tahtiana Rozier: Lelon Huh Other Clinician: Referring Shiron Whetsel: Lelon Huh Treating Braelin Brosch/Extender: Melburn Hake, HOYT Weeks in Treatment: 10 Active Problems Location of Pain Severity and Description of Pain Patient Has Paino Yes Site Locations Rate the pain. Current Pain Level: 7 Pain Management and Medication Current Pain Management: Electronic Signature(s) Signed: 06/24/2019 11:36:00 AM By: Sandre Kitty Signed: 06/24/2019 4:36:59 PM By: Montey Hora Entered By: Sandre Kitty on 06/24/2019 10:42:30 [name] ([id number]) -------------------------------------------------------------------------------- Patient/Caregiver Education Details Patient Name: [name] Date of Service: 06/24/2019 11:00 AM Medical Record Number: [id number] Patient Account Number: [id number] Date of Birth/Gender: 31-Dec-1945 (74 y.o. F) Treating RN: Montey Hora Primary Care Physician: Lelon Huh Other Clinician: Referring Physician: Lelon Huh Treating Physician/Extender: Sharalyn Ink in Treatment: 10 Education Assessment Education Provided To: Patient and Caregiver Education Topics Provided Venous: Handouts: Other: edema management Methods: Explain/Verbal Responses: State content correctly Electronic Signature(s) Signed: 06/24/2019 4:36:59 PM By: Montey Hora Entered By: Montey Hora on 06/24/2019 11:09:25 [name]  ([id number]) -------------------------------------------------------------------------------- Wound Assessment Details Patient Name: ADRIELLA, ESSEX B. Date of Service: 06/24/2019 11:00 AM Medical Record Number: [id number] Patient Account Number: [id number] Date of Birth/Sex: 1945/07/30 (74 y.o. F) Treating RN: Montey Hora Primary Care Lynnix Schoneman: Lelon Huh Other Clinician: Referring Kagan Hietpas: Lelon Huh Treating Aswad Wandrey/Extender: Melburn Hake, HOYT Weeks in Treatment: 10 Wound Status Wound Number: [id number] Primary Venous Leg Ulcer Etiology: Wound Location: Left, Circumferential Lower Leg Secondary Diabetic Wound/Ulcer of the Lower Extremity Wounding Event: Blister Etiology: Date Acquired: 03/31/2019 Wound Healed - Epithelialized Weeks Of Treatment: 10 Status: Clustered Wound: Yes Comorbid Lymphedema, Asthma, Congestive Heart Failure, History: Coronary Artery Disease, Hypertension, Type II Diabetes, Osteoarthritis Photos Wound Measurements Length: (cm) 0 Width: (cm) 0 Depth: (cm) 0 Clustered Quantity: 1 Area: (cm) Volume: (cm) % Reduction in Area: 100% % Reduction in Volume: 100% Epithelialization: Large (67-100%) 0 0 Wound Description Classification: Full Thickness Without Exposed Support Structures Wound Margin: Flat and Intact Exudate Amount: None Present Foul Odor After Cleansing: No Slough/Fibrino No Wound Bed Granulation Amount: None Present (0%) Exposed Structure  Necrotic Amount: None Present (0%) Fascia Exposed: No Fat Layer (Subcutaneous Tissue) Exposed: No Tendon Exposed: No Muscle Exposed: No Joint Exposed: No Bone Exposed: No Limited to Skin Breakdown Electronic Signature(s) Signed: 06/24/2019 4:36:59 PM By: Montey Hora Entered By: Montey Hora on 06/24/2019 11:02:53 [name] ([id number]) -------------------------------------------------------------------------------- [place] Details Patient Name: Jillian Albe B. Date of Service:  06/24/2019 11:00 AM Medical Record Number: [id number] Patient Account Number: [id number] Date of Birth/Sex: 1945/04/09 (74 y.o. F) Treating RN: Montey Hora Primary Care Karthikeya Funke: Lelon Huh Other Clinician: Referring Azlan Hanway: Lelon Huh Treating Markeita Alicia/Extender: Melburn Hake, HOYT Weeks in Treatment: 10 Vital Signs Time Taken: 10:42 Temperature (F): 97.9 Height (in): 64 Pulse (bpm): 67 Weight (lbs): 206 Respiratory Rate (breaths/min): 18 Body Mass Index (BMI): 35.4 Blood Pressure (mmHg): 181/67 Reference Range: 80 - 120 mg / dl Electronic Signature(s) Signed: 06/24/2019 11:36:00 AM By: Sandre Kitty Entered By: Sandre Kitty on 06/24/2019 10:42:18

## 2019-06-24 NOTE — Progress Notes (Addendum)
Jillian, Watson ([id number]) Visit Report for 06/24/2019 Chief Complaint Document Details Patient Name: [name], [name]. Date of Service: 06/24/2019 11:00 AM Medical Record Number: [id number] Patient Account Number: [id number] Date of Birth/Sex: [birth year] (74 y.o. F) Treating RN: Montey Hora Primary Care Provider: Lelon Huh Other Clinician: Referring Provider: Lelon Huh Treating Provider/Extender: Melburn Hake, Rielle Schlauch Weeks in Treatment: 10 Information Obtained from: Patient Chief Complaint Patient seen for complaints of Non-Healing Wounds to both lower extremities Electronic Signature(s) Signed: 06/24/2019 10:32:58 AM By: Worthy Keeler PA-C Entered By: Worthy Keeler on 06/24/2019 10:32:56 [name] ([id number]) -------------------------------------------------------------------------------- HPI Details Patient Name: [name]. Date of Service: 06/24/2019 11:00 AM Medical Record Number: [id number] Patient Account Number: [id number] Date of Birth/Sex: 03/06/1945 (74 y.o. F) Treating RN: Montey Hora Primary Care Provider: Lelon Huh Other Clinician: Referring Provider: Lelon Huh Treating Provider/Extender: Melburn Hake, Taneshia Lorence Weeks in Treatment: 10 History of Present Illness Location: both lower extremity swelling with ulceration HPI Description: 74 year old patient who sees her PCP Dr. Lelon Huh was recently evaluated 10 days ago for diabetes mellitus, hypertension, CHF and hyperlipidemia. she also was noted to have ulcerations develop in her legs and she has been applying Silvadene dressings locally. In the past she has refused wound care referrals.her cardiologist Dr. Saunders Revel saw her and put her on 40 mg of furosemide daily. last hemoglobin A1c was 7.7%. she was also placed on ciprofloxacin twice daily for 7 days and a urine culture was recommended. past medical history significant for coronary artery disease, diabetes mellitus, nonischemic cardiomyopathy  and pulmonary hypertension. She is also status post heart catheterization and coronary angiography, tubal ligation and breast cyst removal in the past. She has never been a smoker. Patient had arterial studies done which showed bilateral ABIs are artificially elevated due to noncompressible and calcified vessels. Triphasic waveform throughout. Right great toe TBI is elevated while the left is normal. 10/23/2016 -- the patient is rather moribund from several issues including chronic back pain and knee pain and swelling of her legs. The large necrotic area on her left lateral anterior calf was bleeding on touch after washing her leg. There was a spot which needed silver nitrate cauterization and this was done appropriately. 11/06/2016 -- she is again noted to have friable bleeding from the left lower extremity wounds and this again had to be cauterized with silver nitrate to control the bleeding as pressure itself would not do it. 11/20/2016 -- the right lower extremity is completely healed and we have ordered 30-40 mm compression stocking's in both the dural layer and also a pair of juxta lites. 12/18/2016 -- she has not been wearing her compression stockings on her right leg and these have opened out into ulcerations again. Her left lower extremity has circumferential ulcerations now. I believe at this stage I would like to get her venous reflux studies done to make certain that there is no fixable superficial venous reflux. 12/29/2016 -- she has made an excellent recovery having continued with her appropriate doses of diuretics and elevation and exercise. She has not yet received her right lower extremity juxta lites. Her venous duplex study is at the end of January 01/13/16 on evaluation today patient's wounds appeared to be overall doing much better much less hyper granular than previous week's evaluation. The Hydrofera Blue Dressing's to be doing very well. She does tell me that she is having a  little bit more discomfort in the posterior aspect of her leg where she has a wound  at this point. Fortunately there appears to be no infection. 01/19/17 on evaluation today patient appears to be doing very well in regard to her bilateral lower extremity swelling and at this point in time her left lower extremity ulcers. Her wounds appear to be doing much better. She has been tolerating the dressing's at this point fortunately she did get the Juxta-Lite compression as of today as well. Nonetheless I am pleased that she has been tolerating everything so well and that her wounds looks so good. In fact she is an excellent granular surface no evidence of slough covering and I do not see any reason for likely debridement today. Especially in regard to the posterior leg. 01/26/17 on evaluation today patient appears to be doing decently well in regard to her wounds. The surface of the majority of her wounds is greatly improved. She has been using the Juxta-Lite compression which is excellent. Nonetheless the wound of the left posterior calf did require some debridement today otherwise the majority of the wounds did not require any debridement. The this was due to slough buildup on the surface. She also has a small skin flap where two of the ulcers actually connected and this has loosened up I'm concerned that things may not heal well with that flap but for the time being I'm not gonna do anything different in that regard. 02/23/17 on evaluation today patient's wound actually appears to be doing fairly well in regard to the remaining left lateral ulcer which we have been treated. With that being said it does appear that her wrap actually slid down the wood bit over the past few days at least that there's actually some friction injury and blistering noted on the medial aspect of her malleolus of the left lower extremity. She continues to use the Juxta- Lite for the right lower extremity. With that being said no  debridement is necessary today and these are very superficial injuries as far as the new injuries are concerned. 03/02/17 on evaluation today patient appears to be doing excellent in regard to her left lateral lower from the ulcers. In fact it appears that she is almost completely healed in regard to the ulcer we have been following and the new injuries which were caused by the wraps slipping down last week have been completely resolved which is excellent news. Overall I'm definitely pleased with the progress that she has made. Fortunately the rat did not cause any irritation as it did previously with the Unna wrap. 03/09/17 on evaluation today patient's wound appears to be completely healed. Obviously this is great news. She and her husband both are extremely pleased and excited to finally have this gone. Obviously she has been dealing with this wound for quite some time. Readmission: 04/14/2019 upon evaluation today patient appears for reevaluation here in our clinic although it has been since March 2019 since I last saw her. She unfortunately is having issues similar to what she had previous which are blisters and ulcerations of the bilateral lower extremities. Fortunately there is no evidence of active infection at this time based on what I am seeing. Unfortunately she is still having a lot of drainage and weeping. I do believe she needs to be compression wrap in order to get this under control and she is previously tolerated a 3 layer compression wrap without any complications whatsoever. No fevers, chills, nausea, vomiting, or diarrhea. [name], [name] ([id number]) 04/28/2019 upon evaluation today patient appears to be doing well with regard to the  bilateral lower extremities at this time. She has been tolerating the dressing changes without complication. Fortunately there is no signs of active infection at this time. No fevers, chills, nausea, vomiting, or diarrhea. 05/05/2019 upon evaluation today  patient appears to be doing excellent in regard to her legs. She has a lot of new skin growth and overall seems to be progressing quite nicely. Fortunately there is no signs of active infection at this time. No fevers, chills, nausea, vomiting, or diarrhea. 05/12/2019 upon evaluation today patient appears to be doing very well with regard to her lower extremities. She is not completely healed at this point but nonetheless he is doing much better even compared to last week. I am very pleased with where things stand. No fevers, chills, nausea, vomiting, or diarrhea. 05/26/2019 upon evaluation today patient actually appears to be doing worse with regard to her lower extremities. She has green drainage unfortunately is having more pain on the right lower extremity as well. Fortunately there is no signs of active infection systemically which is good news. 06/03/2019 upon evaluation today patient appears to be doing about the same in regard to the fact that she still has wounds on both lower extremities although all wounds are measuring better and appearing much better. She finally did start taking the medication which has made a big difference for her that is the Levaquin. Unfortunately this appears to be making her urinate much more frequently. She has been taking this in the evening therefore it is keeping her up at night. They are wondering if they can switch to taking this during the day. I would actually never instructed that they had to take this at night and I think it is definitely fine for her to take it in the morning. She will start that tomorrow. 06/10/2019 upon evaluation today patient appears to be doing excellent in regard to her lower extremities currently. The right lower extremity actually appears to be pretty much healed based on what I am seeing I do not see anything open or draining. The left lower extremity though not completely healed does appear to be doing much better which is great  news. Overall there is no signs of active infection at this time. 06/17/2019 upon evaluation today patient appears to be doing excellent in regard to her ulcers. She has been tolerating the dressing changes without complication in fact everything appears to be almost completely healed today. There is one area on the left lower extremity which appears to potentially still be weeping slightly and I think that we do need to see about wrapping her 1 more week just to make sure everything tightens up before discharge. 06/24/2019 upon evaluation today patient appears to be doing excellent in regard to her bilateral lower extremities. There does not appear to be any evidence of active infection which is great news and overall I am extremely pleased with the fact that again she appears to be ready for discharge. Electronic Signature(s) Signed: 06/24/2019 2:56:20 PM By: Worthy Keeler PA-C Entered By: Worthy Keeler on 06/24/2019 14:56:19 [name] ([id number]) -------------------------------------------------------------------------------- Physical Exam Details Patient Name: [name], Jillian B. Date of Service: 06/24/2019 11:00 AM Medical Record Number: [id number] Patient Account Number: [id number] Date of Birth/Sex: 1945/02/23 (74 y.o. F) Treating RN: Montey Hora Primary Care Provider: Lelon Huh Other Clinician: Referring Provider: Lelon Huh Treating Provider/Extender: Melburn Hake, Gurtaj Ruz Weeks in Treatment: 10 Constitutional Obese and well-hydrated in no acute distress. Respiratory normal breathing without difficulty. Psychiatric this patient  is able to make decisions and demonstrates good insight into disease process. Alert and Oriented x 3. pleasant and cooperative. Notes Patient's wounds have all completely resolved as far as her lower extremities are concerned and I am very happy in that regard. She does have compression socks with her today that she can transition into and I think  that will be definitely helpful as well. This should keep her from having increases in blistering and ulcerations which again is going to be a recurrent issue unless she has proper compression. Electronic Signature(s) Signed: 06/24/2019 2:56:51 PM By: Worthy Keeler PA-C Entered By: Worthy Keeler on 06/24/2019 14:56:50 [name] ([id number]) -------------------------------------------------------------------------------- Physician Orders Details Patient Name: [name] Date of Service: 06/24/2019 11:00 AM Medical Record Number: [id number] Patient Account Number: [id number] Date of Birth/Sex: [birth year] (74 y.o. F) Treating RN: Montey Hora Primary Care Provider: Lelon Huh Other Clinician: Referring Provider: Lelon Huh Treating Provider/Extender: Melburn Hake, Dawid Dupriest Weeks in Treatment: 10 Verbal / Phone Orders: No Diagnosis Coding ICD-10 Coding Code Description E11.622 Type 2 diabetes mellitus with other skin ulcer I89.0 Lymphedema, not elsewhere classified L97.212 Non-pressure chronic ulcer of right calf with fat layer exposed L97.222 Non-pressure chronic ulcer of left calf with fat layer exposed Discharge From [facility] Discharge from [facility] Signature(s) Signed: 06/24/2019 4:36:59 PM By: Montey Hora Signed: 06/24/2019 4:52:12 PM By: Worthy Keeler PA-C Entered By: Montey Hora on 06/24/2019 11:04:11 [name] ([id number]) -------------------------------------------------------------------------------- Problem List Details Patient Name: [name], Jillian B. Date of Service: 06/24/2019 11:00 AM Medical Record Number: [id number] Patient Account Number: [id number] Date of Birth/Sex: 1945/10/10 (74 y.o. F) Treating RN: Montey Hora Primary Care Provider: Lelon Huh Other Clinician: Referring Provider: Lelon Huh Treating Provider/Extender: Melburn Hake, Zylee Marchiano Weeks in Treatment: 10 Active Problems ICD-10 Encounter Code  Description Active Date MDM Diagnosis E11.622 Type 2 diabetes mellitus with other skin ulcer 04/14/2019 No Yes I89.0 Lymphedema, not elsewhere classified 04/14/2019 No Yes L97.212 Non-pressure chronic ulcer of right calf with fat layer exposed 04/14/2019 No Yes L97.222 Non-pressure chronic ulcer of left calf with fat layer exposed 04/14/2019 No Yes Inactive Problems Resolved Problems Electronic Signature(s) Signed: 06/24/2019 10:31:53 AM By: Worthy Keeler PA-C Entered By: Worthy Keeler on 06/24/2019 10:31:53 [name] ([id number]) -------------------------------------------------------------------------------- Progress Note Details Patient Name: Jillian Albe B. Date of Service: 06/24/2019 11:00 AM Medical Record Number: [id number] Patient Account Number: [id number] Date of Birth/Sex: 1945/10/15 (74 y.o. F) Treating RN: Montey Hora Primary Care Provider: Lelon Huh Other Clinician: Referring Provider: Lelon Huh Treating Provider/Extender: Melburn Hake, Cyrstal Leitz Weeks in Treatment: 10 Subjective Chief Complaint Information obtained from Patient Patient seen for complaints of Non-Healing Wounds to both lower extremities History of Present Illness (HPI) The following HPI elements were documented for the patient's wound: Location: both lower extremity swelling with ulceration 74 year old patient who sees her PCP Dr. Lelon Huh was recently evaluated 10 days ago for diabetes mellitus, hypertension, CHF and hyperlipidemia. she also was noted to have ulcerations develop in her legs and she has been applying Silvadene dressings locally. In the past she has refused wound care referrals.her cardiologist Dr. Saunders Revel saw her and put her on 40 mg of furosemide daily. last hemoglobin A1c was 7.7%. she was also placed on ciprofloxacin twice daily for 7 days and a urine culture was recommended. past medical history significant for coronary artery disease, diabetes mellitus, nonischemic  cardiomyopathy and pulmonary hypertension. She is also status post heart catheterization and coronary  angiography, tubal ligation and breast cyst removal in the past. She has never been a smoker. Patient had arterial studies done which showed bilateral ABIs are artificially elevated due to noncompressible and calcified vessels. Triphasic waveform throughout. Right great toe TBI is elevated while the left is normal. 10/23/2016 -- the patient is rather moribund from several issues including chronic back pain and knee pain and swelling of her legs. The large necrotic area on her left lateral anterior calf was bleeding on touch after washing her leg. There was a spot which needed silver nitrate cauterization and this was done appropriately. 11/06/2016 -- she is again noted to have friable bleeding from the left lower extremity wounds and this again had to be cauterized with silver nitrate to control the bleeding as pressure itself would not do it. 11/20/2016 -- the right lower extremity is completely healed and we have ordered 30-40 mm compression stocking's in both the dural layer and also a pair of juxta lites. 12/18/2016 -- she has not been wearing her compression stockings on her right leg and these have opened out into ulcerations again. Her left lower extremity has circumferential ulcerations now. I believe at this stage I would like to get her venous reflux studies done to make certain that there is no fixable superficial venous reflux. 12/29/2016 -- she has made an excellent recovery having continued with her appropriate doses of diuretics and elevation and exercise. She has not yet received her right lower extremity juxta lites. Her venous duplex study is at the end of January 01/13/16 on evaluation today patient's wounds appeared to be overall doing much better much less hyper granular than previous week's evaluation. The Hydrofera Blue Dressing's to be doing very well. She does tell me that  she is having a little bit more discomfort in the posterior aspect of her leg where she has a wound at this point. Fortunately there appears to be no infection. 01/19/17 on evaluation today patient appears to be doing very well in regard to her bilateral lower extremity swelling and at this point in time her left lower extremity ulcers. Her wounds appear to be doing much better. She has been tolerating the dressing's at this point fortunately she did get the Juxta-Lite compression as of today as well. Nonetheless I am pleased that she has been tolerating everything so well and that her wounds looks so good. In fact she is an excellent granular surface no evidence of slough covering and I do not see any reason for likely debridement today. Especially in regard to the posterior leg. 01/26/17 on evaluation today patient appears to be doing decently well in regard to her wounds. The surface of the majority of her wounds is greatly improved. She has been using the Juxta-Lite compression which is excellent. Nonetheless the wound of the left posterior calf did require some debridement today otherwise the majority of the wounds did not require any debridement. The this was due to slough buildup on the surface. She also has a small skin flap where two of the ulcers actually connected and this has loosened up I'm concerned that things may not heal well with that flap but for the time being I'm not gonna do anything different in that regard. 02/23/17 on evaluation today patient's wound actually appears to be doing fairly well in regard to the remaining left lateral ulcer which we have been treated. With that being said it does appear that her wrap actually slid down the wood bit over the past  few days at least that there's actually some friction injury and blistering noted on the medial aspect of her malleolus of the left lower extremity. She continues to use the Juxta- Lite for the right lower extremity. With that  being said no debridement is necessary today and these are very superficial injuries as far as the new injuries are concerned. 03/02/17 on evaluation today patient appears to be doing excellent in regard to her left lateral lower from the ulcers. In fact it appears that she is almost completely healed in regard to the ulcer we have been following and the new injuries which were caused by the wraps slipping down last week have been completely resolved which is excellent news. Overall I'm definitely pleased with the progress that she has made. Fortunately the rat did not cause any irritation as it did previously with the Unna wrap. 03/09/17 on evaluation today patient's wound appears to be completely healed. Obviously this is great news. She and her husband both are extremely pleased and excited to finally have this gone. Obviously she has been dealing with this wound for quite some time. Readmission: [name], [name] ([id number]) 04/14/2019 upon evaluation today patient appears for reevaluation here in our clinic although it has been since March 2019 since I last saw her. She unfortunately is having issues similar to what she had previous which are blisters and ulcerations of the bilateral lower extremities. Fortunately there is no evidence of active infection at this time based on what I am seeing. Unfortunately she is still having a lot of drainage and weeping. I do believe she needs to be compression wrap in order to get this under control and she is previously tolerated a 3 layer compression wrap without any complications whatsoever. No fevers, chills, nausea, vomiting, or diarrhea. 04/28/2019 upon evaluation today patient appears to be doing well with regard to the bilateral lower extremities at this time. She has been tolerating the dressing changes without complication. Fortunately there is no signs of active infection at this time. No fevers, chills, nausea, vomiting, or diarrhea. 05/05/2019 upon  evaluation today patient appears to be doing excellent in regard to her legs. She has a lot of new skin growth and overall seems to be progressing quite nicely. Fortunately there is no signs of active infection at this time. No fevers, chills, nausea, vomiting, or diarrhea. 05/12/2019 upon evaluation today patient appears to be doing very well with regard to her lower extremities. She is not completely healed at this point but nonetheless he is doing much better even compared to last week. I am very pleased with where things stand. No fevers, chills, nausea, vomiting, or diarrhea. 05/26/2019 upon evaluation today patient actually appears to be doing worse with regard to her lower extremities. She has green drainage unfortunately is having more pain on the right lower extremity as well. Fortunately there is no signs of active infection systemically which is good news. 06/03/2019 upon evaluation today patient appears to be doing about the same in regard to the fact that she still has wounds on both lower extremities although all wounds are measuring better and appearing much better. She finally did start taking the medication which has made a big difference for her that is the Levaquin. Unfortunately this appears to be making her urinate much more frequently. She has been taking this in the evening therefore it is keeping her up at night. They are wondering if they can switch to taking this during the day. I would actually  never instructed that they had to take this at night and I think it is definitely fine for her to take it in the morning. She will start that tomorrow. 06/10/2019 upon evaluation today patient appears to be doing excellent in regard to her lower extremities currently. The right lower extremity actually appears to be pretty much healed based on what I am seeing I do not see anything open or draining. The left lower extremity though not completely healed does appear to be doing much better  which is great news. Overall there is no signs of active infection at this time. 06/17/2019 upon evaluation today patient appears to be doing excellent in regard to her ulcers. She has been tolerating the dressing changes without complication in fact everything appears to be almost completely healed today. There is one area on the left lower extremity which appears to potentially still be weeping slightly and I think that we do need to see about wrapping her 1 more week just to make sure everything tightens up before discharge. 06/24/2019 upon evaluation today patient appears to be doing excellent in regard to her bilateral lower extremities. There does not appear to be any evidence of active infection which is great news and overall I am extremely pleased with the fact that again she appears to be ready for discharge. Objective Constitutional Obese and well-hydrated in no acute distress. Vitals Time Taken: 10:42 AM, Height: 64 in, Weight: 206 lbs, BMI: 35.4, Temperature: 97.9 F, Pulse: 67 bpm, Respiratory Rate: 18 breaths/min, Blood Pressure: 181/67 mmHg. Respiratory normal breathing without difficulty. Psychiatric this patient is able to make decisions and demonstrates good insight into disease process. Alert and Oriented x 3. pleasant and cooperative. General Notes: Patient's wounds have all completely resolved as far as her lower extremities are concerned and I am very happy in that regard. She does have compression socks with her today that she can transition into and I think that will be definitely helpful as well. This should keep her from having increases in blistering and ulcerations which again is going to be a recurrent issue unless she has proper compression. Integumentary (Hair, Skin) Wound #17 status is Healed - Epithelialized. Original cause of wound was Blister. The wound is located on the Left,Circumferential Lower Leg. The wound measures 0cm length x 0cm width x 0cm depth;  0cm^2 area and 0cm^3 volume. The wound is limited to skin breakdown. There is a none present amount of drainage noted. The wound margin is flat and intact. There is no granulation within the wound bed. There is no necrotic tissue within the wound bed. 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 I would recommend currently that we go ahead and continue with the at home compression stockings I think that still the best option for her. 2. I am also can recommend that she continue with monitoring for any signs of infection or worsening and if so her husband will let me know as soon as possible. We will see the patient back for follow-up visit as needed. Electronic Signature(s) Signed: 06/24/2019 2:57:13 PM By: Worthy Keeler PA-C Entered By: Worthy Keeler on 06/24/2019 14:57:12 [name], [name] ([id number]) -------------------------------------------------------------------------------- SuperBill Details Patient Name: [name] Date of Service: 06/24/2019 Medical  Record Number: [id number] Patient Account Number: [id number] Date of Birth/Sex: 03/22/1945 (74 y.o. F) Treating RN: Montey Hora Primary Care Provider: Lelon Huh Other Clinician: Referring Provider: Lelon Huh Treating Provider/Extender: Melburn Hake, Leverett Camplin Weeks in Treatment: 10 Diagnosis Coding ICD-10 Codes Code Description E11.622 Type 2 diabetes mellitus with other skin ulcer I89.0 Lymphedema, not elsewhere classified L97.212 Non-pressure chronic ulcer of right calf with fat layer exposed L97.222 Non-pressure chronic ulcer of left calf with fat layer exposed Facility Procedures CPT4 Code: 56943700 Description:  99213 - WOUND CARE VISIT-LEV 3 EST PT Modifier: Quantity: 1 Physician Procedures CPT4 Code: 5259102 Description: 89022 - WC PHYS LEVEL 3 - EST PT Modifier: Quantity: 1 CPT4 Code: Description: ICD-10 Diagnosis Description E11.622 Type 2 diabetes mellitus with other skin ulcer I89.0 Lymphedema, not elsewhere classified L97.212 Non-pressure chronic ulcer of right calf with fat layer exposed L97.222 Non-pressure chronic ulcer of left  calf with fat layer exposed Modifier: Quantity: Electronic Signature(s) Signed: 06/24/2019 2:57:27 PM By: Worthy Keeler PA-C Entered By: Worthy Keeler on 06/24/2019 14:57:27

## 2019-06-26 ENCOUNTER — Telehealth: Payer: Self-pay | Admitting: *Deleted

## 2019-06-26 MED ORDER — FUROSEMIDE 40 MG PO TABS: 60.0000 mg | ORAL_TABLET | Freq: Every day | ORAL | 1 refills | Status: DC

## 2019-06-26 NOTE — Telephone Encounter (Signed)
-----   Message from Arvil Chaco, PA-C sent at 06/26/2019  1:40 AM EDT ----- Please let Ms. Fray know that her kidney function continues to improve and is back to her baseline.   Given she was still volume overloaded at our visit this past week, let's increase her from lasix 40mg  daily to lasix 60mg  daily. She should monitor her symptoms and BP / urine output with this increase. We will recheck her kidney function again at or just before our next visit.   Her sugars are improved from previous labs though still on the higher side - she should be sure to talk with her PCP about when to restart her metformin (were holding this medication due to her kidney function).   Please do not hesitate to call if any concerns.  (1) Increase to lasix 60mg  daily.  (2) We will recheck BMET at or directly before follow-up, whichever works best with her schedule. (3) Call if BP remains over 130/80 on a consistent basis, even after increase of metoprolol to 75mg  BID & increase of lasix to 60mg  daily. (4) Talk with PCP about restart of metformin

## 2019-06-26 NOTE — Telephone Encounter (Signed)
Spoke with patient's husband, ok per DPR. He is aware of the plan of care. Med list updated. He and patient prefers to have patient get the lab work at the office visit because going to the [facility] is difficult for them at times.

## 2019-06-27 ENCOUNTER — Other Ambulatory Visit: Payer: Self-pay

## 2019-06-27 ENCOUNTER — Ambulatory Visit: Payer: Medicare Other | Admitting: Urology

## 2019-06-27 ENCOUNTER — Encounter: Payer: Self-pay | Admitting: Urology

## 2019-06-27 VITALS — BP 172/83 | HR 61 | Ht 65.0 in | Wt 204.0 lb

## 2019-06-27 DIAGNOSIS — R3915 Urgency of urination: Secondary | ICD-10-CM | POA: Diagnosis not present

## 2019-06-27 DIAGNOSIS — N3941 Urge incontinence: Secondary | ICD-10-CM | POA: Diagnosis not present

## 2019-06-27 MED ORDER — OXYBUTYNIN CHLORIDE 5 MG PO TABS: 5.0000 mg | ORAL_TABLET | Freq: Two times a day (BID) | ORAL | 3 refills | Status: DC

## 2019-06-27 NOTE — Patient Instructions (Signed)

## 2019-06-27 NOTE — Progress Notes (Signed)
06/27/2019 10:39 AM   [name] 1945-03-01 [id number]  Referring provider: Birdie Sons, [place] [address] [address],  [place] [address]  No chief complaint on file.   HPI:  F/u -   1) frequency, urgency, UUI  - Feb 2020 - 10 ppd use. Ng risk includes DM and immobility (wheelchair). PVR 86 ml. She tried solifenacin and timed voiding. She started tolterodine twice daily and was doing well but expensive. Switched to oxybutynin 5 mg po BID.   2) MH, bladder erythema - A Mar 2018 CT / PET showed a normal GU tract done for MM.She underwent renal US 03/06/2018 which was benign. She underwent cystoscopy for urgency and microscopic hematuria June 07, 2018. She had an ulcerative erythematous area with cloudy urine on the right bladder wall. She took nightly doxycycline and repeat cystoscopy was normal Jul 2020.  [name] returns and is doing well. She is able to control her urination. Urge incontinence episodes now rare and pads dry most days. No gross hematuria.    PMH: Past Medical History:  Diagnosis Date  . Aortic stenosis   . Asthma   . Coronary artery disease   . Diabetes mellitus without complication ([name])   . History of measles   . Hypertension   . Nonischemic cardiomyopathy ([place])   . Pulmonary hypertension ([place])     Surgical History: Past Surgical History:  Procedure Laterality Date  . CARDIAC CATHETERIZATION    . Cyst(solitary) of breast:removed    . RIGHT/LEFT HEART CATH AND CORONARY ANGIOGRAPHY N/A 03/07/2016   Procedure: Right/Left Heart Cath and Coronary Angiography;  Surgeon: Nelva Bush, MD;  Location: [facility];  Service: Cardiovascular;  Laterality: N/A;  . TUBAL LIGATION      Home Medications:  Allergies as of 06/27/2019      Reactions   Hydrochlorothiazide    Leg Cramps   Penicillins Rash      Medication List       Accurate as of June 27, 2019 10:39 AM. If you have any questions, ask your nurse or doctor.          aspirin 325 MG tablet Take 325 mg by mouth every 6 (six) hours as needed (pain).   aspirin EC 81 MG tablet Take 1 tablet (81 mg total) by mouth daily.   furosemide 40 MG tablet Commonly known as: LASIX Take 1.5 tablets (60 mg total) by mouth daily.   HYDROcodone-acetaminophen 10-325 MG tablet Commonly known as: NORCO Take 1 tablet by mouth every 8 (eight) hours as needed.   lisinopril 20 MG tablet Commonly known as: ZESTRIL Take 20 mg by mouth daily.   metoprolol tartrate 50 MG tablet Commonly known as: LOPRESSOR Take 1.5 tablets (75 mg total) by mouth 2 (two) times daily.   oxybutynin 5 MG tablet Commonly known as: DITROPAN Take 1 tablet (5 mg total) by mouth 2 (two) times daily.   vitamin B-12 500 MCG tablet Commonly known as: CYANOCOBALAMIN Take 250 mcg by mouth daily.   Vitamin D 50 MCG (2000 UT) tablet Take 2,000 Units by mouth daily.       Allergies:  Allergies  Allergen Reactions  . Hydrochlorothiazide     Leg Cramps  . Penicillins Rash    Family History: Family History  Problem Relation Age of Onset  . Cancer Mother        Uterine cancer  . Diabetes Mother   . Heart disease Mother   . Heart failure Mother   .  Parkinson's disease Father   . Cancer Sister        breast cancer  . Diabetes Brother        Non-insulin Dependent Diabetes Mellitus  . Mesothelioma Brother   . Hypertension Sister   . Diabetes Sister        Non-insulin dependent Diabetes Mellitus    Social History:  reports that she has never smoked. She has never used smokeless tobacco. She reports that she does not drink alcohol and does not use drugs.   Physical Exam: There were no vitals taken for this visit.  Constitutional:  Alert and oriented, No acute distress. HEENT: [place] AT, moist mucus membranes.  Trachea midline, no masses. Cardiovascular: No clubbing, cyanosis, or edema. Respiratory: Normal respiratory effort, no increased work of breathing. GI: Abdomen is soft,  nontender, nondistended, no abdominal masses GU: No CVA tenderness Skin: No rashes, bruises or suspicious lesions. Neurologic: Grossly intact, no focal deficits, moving all 4 extremities. Psychiatric: Normal mood and affect.  Laboratory Data: Lab Results  Component Value Date   WBC 6.9 03/28/2019   HGB 9.0 (L) 03/28/2019   HCT 28.4 (L) 03/28/2019   MCV 91 03/28/2019   PLT 301 03/28/2019    Lab Results  Component Value Date   CREATININE 1.38 (H) 06/23/2019    No results found for: PSA  No results found for: TESTOSTERONE  Lab Results  Component Value Date   HGBA1C 6.6 (A) 03/28/2019    Urinalysis    Component Value Date/Time   APPEARANCEUR Hazy (A) 07/31/2018 0841   GLUCOSEU Negative 07/31/2018 0841   BILIRUBINUR Negative 07/31/2018 0841   KETONESUR negative 09/21/2016 1249   PROTEINUR Negative 07/31/2018 0841   UROBILINOGEN 0.2 01/25/2018 1143   NITRITE Negative 07/31/2018 0841   LEUKOCYTESUR 2+ (A) 07/31/2018 0841    Lab Results  Component Value Date   LABMICR See below: 07/31/2018   WBCUA 11-30 (A) 07/31/2018   RBCUA 3-10 (A) 02/27/2018   LABEPIT 0-10 07/31/2018   BACTERIA Few 07/31/2018    Pertinent Imaging: n/a No results found for this or any previous visit.  No results found for this or any previous visit.  No results found for this or any previous visit.  No results found for this or any previous visit.  Results for orders placed during the hospital encounter of 03/06/18  US RENAL  Narrative CLINICAL DATA:  Microhematuria  EXAM: RENAL / URINARY TRACT ULTRASOUND COMPLETE  COMPARISON:  03/31/2016  FINDINGS: Right Kidney:  Renal measurements: 10.3 x 5.6 x 4.1 cm. = volume: 122 mL. Mild cortical thinning is noted. A 9 mm hypoechoic structure is noted within the midportion of the right kidney consistent with small cyst. No obstructive changes are seen. No renal calculi are noted.  Left Kidney:  Renal measurements: 10.1 x 5.4 x 4.6  cm = volume: 132 mL. Mild cortical thinning is noted. No mass lesion or hydronephrosis is noted.  Bladder:  Appears normal for degree of bladder distention.  IMPRESSION: Mild cortical thinning bilaterally.  Tiny right renal cyst.   Electronically Signed By: Inez Catalina M.D. On: 03/07/2018 08:31  No results found for this or any previous visit.  No results found for this or any previous visit.  No results found for this or any previous visit.   Assessment & Plan:    Urge incontinence - improved   Urgency - continue oxybutynin BID. Refilled.   No follow-ups on file.  Festus Aloe, MD  [facility]  [address], [place], [place] [address] [phone]

## 2019-06-27 NOTE — Progress Notes (Signed)
Established patient visit   Patient: [name]   DOB: 12/30/1945   74 y.o. Female  MRN: [id number] Visit Date: 06/30/2019  Today's healthcare provider: Lelon Huh, MD   Chief Complaint  Patient presents with  . Diabetes  . Hypertension   [employer] as a scribe for Lelon Huh, MD.,have documented all relevant documentation on the behalf of Lelon Huh, MD,as directed by  Lelon Huh, MD while in the presence of Lelon Huh, MD.  Subjective    HPI Diabetes Mellitus Type II, Follow-up  Lab Results  Component Value Date   HGBA1C 6.6 (A) 03/28/2019   HGBA1C 7.0 (H) 09/27/2018   HGBA1C 7.1 (A) 05/31/2018   Wt Readings from Last 3 Encounters:  06/27/19 204 lb (92.5 kg)  06/23/19 206 lb (93.4 kg)  05/23/19 206 lb (93.4 kg)   Last seen for diabetes 3 months ago.  Management since then includes discontinued Metformin due to worsening kidney functions.  She reports good compliance with treatment. She is not having side effects.  Symptoms: No fatigue No foot ulcerations  No appetite changes No nausea  No paresthesia of the feet  No polydipsia  No polyuria No visual disturbances   No vomiting     Home blood sugar records: fasting range: pt is not checking at home   Episodes of hypoglycemia? No    Current insulin regiment: none Most Recent Eye Exam: 1 year ago Current exercise: none Current diet habits: in general, a "healthy" diet    Pertinent Labs: Lab Results  Component Value Date   CHOL 151 09/27/2018   HDL 48 09/27/2018   LDLCALC 84 09/27/2018   TRIG 105 09/27/2018   CHOLHDL 3.1 09/27/2018   Lab Results  Component Value Date   NA 139 06/23/2019   K 4.1 06/23/2019   CREATININE 1.38 (H) 06/23/2019   GFRNONAA 37 (L) 06/23/2019   GFRAA 43 (L) 06/23/2019   GLUCOSE 142 (H) 06/23/2019     ---------------------------------------------------------------------------------------------------  Hypertension, follow-up  BP  Readings from Last 3 Encounters:  06/30/19 (!) 154/84  06/27/19 (!) 172/83  06/23/19 (!) 168/100   Wt Readings from Last 3 Encounters:  06/27/19 204 lb (92.5 kg)  06/23/19 206 lb (93.4 kg)  05/23/19 206 lb (93.4 kg)     She was last seen for hypertension 3 months ago.  BP at that visit was 95/60. Management since that visit includes reducing Amlodipine from 29m to 2.579mdaily due to low blood pressure.  She reports good compliance with treatment. She is not having side effects.  She is following a Regular diet. She is not exercising. She does not smoke.  Use of agents associated with hypertension: none.   Outside blood pressures are not being checked at home. Symptoms: No chest pain No chest pressure  No palpitations No syncope  No dyspnea No orthopnea  No paroxysmal nocturnal dyspnea No lower extremity edema   Pertinent labs: Lab Results  Component Value Date   CHOL 151 09/27/2018   HDL 48 09/27/2018   LDLCALC 84 09/27/2018   TRIG 105 09/27/2018   CHOLHDL 3.1 09/27/2018   Lab Results  Component Value Date   NA 139 06/23/2019   K 4.1 06/23/2019   CREATININE 1.38 (H) 06/23/2019   GFRNONAA 37 (L) 06/23/2019   GFRAA 43 (L) 06/23/2019   GLUCOSE 142 (H) 06/23/2019     The 10-year ASCVD risk score (GMikey BussingC Jr., et al., 2013) is: 47.6%   --------------------------------------------------------------------------------------------------- Her  husband reports that her cardiologist discontinued her metformin, spironolactone, amlodipine and lovastatin. She reports the swelling in her legs have greatly improved and wounds have mostly resolved.   Her husband reports that the patient does get short of breath with minimal exertion. She is also very weak in her legs and pretty stays in lift chair most of the time. He has to help her get in and our of the chair and getting to restroom. She can only take a few steps on her own before her legs give out.        Medications: Outpatient Medications Prior to Visit  Medication Sig  . amLODipine (NORVASC) 5 MG tablet Take 5 mg by mouth daily.  Marland Kitchen aspirin 325 MG tablet Take 325 mg by mouth every 6 (six) hours as needed (pain).  Marland Kitchen aspirin EC 81 MG tablet Take 1 tablet (81 mg total) by mouth daily.  . Cholecalciferol (VITAMIN D) 50 MCG (2000 UT) tablet Take 2,000 Units by mouth daily.  . cyanocobalamin 500 MCG tablet Take 250 mcg by mouth daily.   . furosemide (LASIX) 40 MG tablet Take 1.5 tablets (60 mg total) by mouth daily.  Marland Kitchen HYDROcodone-acetaminophen (NORCO) 10-325 MG tablet Take 1 tablet by mouth every 8 (eight) hours as needed.  Marland Kitchen lisinopril (ZESTRIL) 20 MG tablet Take 20 mg by mouth daily.  Marland Kitchen lovastatin (MEVACOR) 20 MG tablet Take 20 mg by mouth daily.  . metFORMIN (GLUCOPHAGE) 500 MG tablet Take 500 mg by mouth 2 (two) times daily.  . metoprolol tartrate (LOPRESSOR) 50 MG tablet Take 1.5 tablets (75 mg total) by mouth 2 (two) times daily.  Marland Kitchen oxybutynin (DITROPAN) 5 MG tablet Take 1 tablet (5 mg total) by mouth 2 (two) times daily.   No facility-administered medications prior to visit.    Review of Systems  Constitutional: Negative for appetite change, chills, fatigue and fever.  Respiratory: Negative for chest tightness and shortness of breath.   Cardiovascular: Negative for chest pain and palpitations.  Gastrointestinal: Negative for abdominal pain, nausea and vomiting.  Neurological: Negative for dizziness and weakness.      Objective    BP (!) 154/84 (BP Location: Right Arm, Patient Position: Sitting, Cuff Size: Large)   Pulse (!) 57   Temp (!) 97.1 F (36.2 C) (Temporal)    Physical Exam   General: Appearance:    Obese female in no acute distress  Eyes:    PERRL, conjunctiva/corneas clear, EOM's intact       Lungs:     Clear to auscultation bilaterally, respirations unlabored  Heart:    Bradycardic. Normal rhythm.  3/6 harsh, crescendo-decrescendo, systolic murmur at right  upper sternal border  MS:   All extremities are intact. 3+ bilateral LE edea.   Neurologic:   Awake, alert, oriented x 3. No apparent focal neurological           defect.       Results for orders placed or performed in visit on 06/30/19  POCT HgB A1C  Result Value Ref Range   Hemoglobin A1C 6.9 (A) 4.0 - 5.6 %   Est. average glucose Bld gHb Est-mCnc 151     Assessment & Plan     1. Diabetes mellitus with nephropathy ([place]) Still fairly well controlled off metformin. Recent eGFR had improved. Will stay off metformin for now.   2. Essential hypertension SBP uncontrolled since discontinuation of amlodipine and spironolactone. She does have upcoming appt with cardiology to follow up .   3.  Chronic HFrEF (heart failure with reduced ejection fraction) ([place])  - Ambulatory referral to Home Health  4. Weakness of both lower extremities  - Ambulatory referral to Home Health  5. Asthma - albuterol (VENTOLIN HFA) 108 (90 Base) MCG/ACT inhaler; Inhale 1-2 puffs into the lungs every 6 (six) hours as needed for shortness of breath.  Dispense: 18 g; Refill: 3   No follow-ups on file.      The entirety of the information documented in the History of Present Illness, Review of Systems and Physical Exam were personally obtained by me. Portions of this information were initially documented by the CMA and reviewed by me for thoroughness and accuracy.      Lelon Huh, MD  [facility] [phone] (phone) [phone] (fax)  [place]

## 2019-06-30 ENCOUNTER — Encounter: Payer: Self-pay | Admitting: Family Medicine

## 2019-06-30 ENCOUNTER — Other Ambulatory Visit: Payer: Self-pay

## 2019-06-30 ENCOUNTER — Ambulatory Visit (INDEPENDENT_AMBULATORY_CARE_PROVIDER_SITE_OTHER): Payer: Medicare Other | Admitting: Family Medicine

## 2019-06-30 VITALS — BP 154/84 | HR 57 | Temp 97.1°F

## 2019-06-30 DIAGNOSIS — I10 Essential (primary) hypertension: Secondary | ICD-10-CM | POA: Diagnosis not present

## 2019-06-30 DIAGNOSIS — I5022 Chronic systolic (congestive) heart failure: Secondary | ICD-10-CM

## 2019-06-30 DIAGNOSIS — E1121 Type 2 diabetes mellitus with diabetic nephropathy: Secondary | ICD-10-CM | POA: Diagnosis not present

## 2019-06-30 DIAGNOSIS — R29898 Other symptoms and signs involving the musculoskeletal system: Secondary | ICD-10-CM | POA: Diagnosis not present

## 2019-06-30 LAB — POCT GLYCOSYLATED HEMOGLOBIN (HGB A1C)
Est. average glucose Bld gHb Est-mCnc: 151
Hemoglobin A1C: 6.9 % — AB (ref 4.0–5.6)

## 2019-06-30 MED ORDER — ALBUTEROL SULFATE HFA 108 (90 BASE) MCG/ACT IN AERS: 1.0000 | INHALATION_SPRAY | Freq: Four times a day (QID) | RESPIRATORY_TRACT | 3 refills | Status: DC | PRN

## 2019-07-02 ENCOUNTER — Telehealth: Payer: Self-pay | Admitting: Family Medicine

## 2019-07-02 DIAGNOSIS — I2510 Atherosclerotic heart disease of native coronary artery without angina pectoris: Secondary | ICD-10-CM | POA: Diagnosis not present

## 2019-07-02 DIAGNOSIS — I428 Other cardiomyopathies: Secondary | ICD-10-CM | POA: Diagnosis not present

## 2019-07-02 DIAGNOSIS — I5042 Chronic combined systolic (congestive) and diastolic (congestive) heart failure: Secondary | ICD-10-CM | POA: Diagnosis not present

## 2019-07-02 DIAGNOSIS — I350 Nonrheumatic aortic (valve) stenosis: Secondary | ICD-10-CM | POA: Diagnosis not present

## 2019-07-02 DIAGNOSIS — C9000 Multiple myeloma not having achieved remission: Secondary | ICD-10-CM | POA: Diagnosis not present

## 2019-07-02 DIAGNOSIS — E1151 Type 2 diabetes mellitus with diabetic peripheral angiopathy without gangrene: Secondary | ICD-10-CM | POA: Diagnosis not present

## 2019-07-02 DIAGNOSIS — I272 Pulmonary hypertension, unspecified: Secondary | ICD-10-CM | POA: Diagnosis not present

## 2019-07-02 DIAGNOSIS — E1122 Type 2 diabetes mellitus with diabetic chronic kidney disease: Secondary | ICD-10-CM | POA: Diagnosis not present

## 2019-07-02 DIAGNOSIS — I872 Venous insufficiency (chronic) (peripheral): Secondary | ICD-10-CM | POA: Diagnosis not present

## 2019-07-02 DIAGNOSIS — N183 Chronic kidney disease, stage 3 unspecified: Secondary | ICD-10-CM | POA: Diagnosis not present

## 2019-07-02 DIAGNOSIS — I130 Hypertensive heart and chronic kidney disease with heart failure and stage 1 through stage 4 chronic kidney disease, or unspecified chronic kidney disease: Secondary | ICD-10-CM | POA: Diagnosis not present

## 2019-07-02 DIAGNOSIS — G8222 Paraplegia, incomplete: Secondary | ICD-10-CM | POA: Diagnosis not present

## 2019-07-02 DIAGNOSIS — E785 Hyperlipidemia, unspecified: Secondary | ICD-10-CM | POA: Diagnosis not present

## 2019-07-02 NOTE — Telephone Encounter (Signed)
Home Health Verbal Orders - Caller/Agency: [facility]  Callback Number: [phone] secure line  Requesting OT/PT/Skilled Nursing/Social Work/Speech Therapy: PT Frequency: 1x for 1wk, 2x for 3wks

## 2019-07-02 NOTE — Telephone Encounter (Signed)
That's fine

## 2019-07-02 NOTE — Telephone Encounter (Signed)
Attempted to contact [name], no answer left a  voicemail. Okay for [facility] triage to give [place] verbal orders.

## 2019-07-04 NOTE — Telephone Encounter (Signed)
[name] with [facility] called and aware Dr Caryn Section said OK for PT. [name] had told her he had not gotten the message.

## 2019-07-09 DIAGNOSIS — I350 Nonrheumatic aortic (valve) stenosis: Secondary | ICD-10-CM | POA: Diagnosis not present

## 2019-07-09 DIAGNOSIS — E785 Hyperlipidemia, unspecified: Secondary | ICD-10-CM | POA: Diagnosis not present

## 2019-07-09 DIAGNOSIS — G8222 Paraplegia, incomplete: Secondary | ICD-10-CM | POA: Diagnosis not present

## 2019-07-09 DIAGNOSIS — I872 Venous insufficiency (chronic) (peripheral): Secondary | ICD-10-CM | POA: Diagnosis not present

## 2019-07-09 DIAGNOSIS — N183 Chronic kidney disease, stage 3 unspecified: Secondary | ICD-10-CM | POA: Diagnosis not present

## 2019-07-09 DIAGNOSIS — E1122 Type 2 diabetes mellitus with diabetic chronic kidney disease: Secondary | ICD-10-CM | POA: Diagnosis not present

## 2019-07-09 DIAGNOSIS — I428 Other cardiomyopathies: Secondary | ICD-10-CM | POA: Diagnosis not present

## 2019-07-09 DIAGNOSIS — C9000 Multiple myeloma not having achieved remission: Secondary | ICD-10-CM | POA: Diagnosis not present

## 2019-07-09 DIAGNOSIS — I130 Hypertensive heart and chronic kidney disease with heart failure and stage 1 through stage 4 chronic kidney disease, or unspecified chronic kidney disease: Secondary | ICD-10-CM | POA: Diagnosis not present

## 2019-07-09 DIAGNOSIS — E1151 Type 2 diabetes mellitus with diabetic peripheral angiopathy without gangrene: Secondary | ICD-10-CM | POA: Diagnosis not present

## 2019-07-09 DIAGNOSIS — I5042 Chronic combined systolic (congestive) and diastolic (congestive) heart failure: Secondary | ICD-10-CM | POA: Diagnosis not present

## 2019-07-09 DIAGNOSIS — I272 Pulmonary hypertension, unspecified: Secondary | ICD-10-CM | POA: Diagnosis not present

## 2019-07-09 DIAGNOSIS — I2510 Atherosclerotic heart disease of native coronary artery without angina pectoris: Secondary | ICD-10-CM | POA: Diagnosis not present

## 2019-07-10 DIAGNOSIS — I130 Hypertensive heart and chronic kidney disease with heart failure and stage 1 through stage 4 chronic kidney disease, or unspecified chronic kidney disease: Secondary | ICD-10-CM | POA: Diagnosis not present

## 2019-07-10 DIAGNOSIS — I350 Nonrheumatic aortic (valve) stenosis: Secondary | ICD-10-CM | POA: Diagnosis not present

## 2019-07-10 DIAGNOSIS — E1151 Type 2 diabetes mellitus with diabetic peripheral angiopathy without gangrene: Secondary | ICD-10-CM | POA: Diagnosis not present

## 2019-07-10 DIAGNOSIS — E1122 Type 2 diabetes mellitus with diabetic chronic kidney disease: Secondary | ICD-10-CM | POA: Diagnosis not present

## 2019-07-10 DIAGNOSIS — I428 Other cardiomyopathies: Secondary | ICD-10-CM | POA: Diagnosis not present

## 2019-07-10 DIAGNOSIS — I5042 Chronic combined systolic (congestive) and diastolic (congestive) heart failure: Secondary | ICD-10-CM | POA: Diagnosis not present

## 2019-07-10 DIAGNOSIS — I872 Venous insufficiency (chronic) (peripheral): Secondary | ICD-10-CM | POA: Diagnosis not present

## 2019-07-10 DIAGNOSIS — N183 Chronic kidney disease, stage 3 unspecified: Secondary | ICD-10-CM | POA: Diagnosis not present

## 2019-07-10 DIAGNOSIS — E785 Hyperlipidemia, unspecified: Secondary | ICD-10-CM | POA: Diagnosis not present

## 2019-07-10 DIAGNOSIS — I272 Pulmonary hypertension, unspecified: Secondary | ICD-10-CM | POA: Diagnosis not present

## 2019-07-10 DIAGNOSIS — G8222 Paraplegia, incomplete: Secondary | ICD-10-CM | POA: Diagnosis not present

## 2019-07-10 DIAGNOSIS — C9000 Multiple myeloma not having achieved remission: Secondary | ICD-10-CM | POA: Diagnosis not present

## 2019-07-10 DIAGNOSIS — I2510 Atherosclerotic heart disease of native coronary artery without angina pectoris: Secondary | ICD-10-CM | POA: Diagnosis not present

## 2019-07-15 DIAGNOSIS — I272 Pulmonary hypertension, unspecified: Secondary | ICD-10-CM | POA: Diagnosis not present

## 2019-07-15 DIAGNOSIS — C9000 Multiple myeloma not having achieved remission: Secondary | ICD-10-CM | POA: Diagnosis not present

## 2019-07-15 DIAGNOSIS — E1122 Type 2 diabetes mellitus with diabetic chronic kidney disease: Secondary | ICD-10-CM | POA: Diagnosis not present

## 2019-07-15 DIAGNOSIS — I350 Nonrheumatic aortic (valve) stenosis: Secondary | ICD-10-CM | POA: Diagnosis not present

## 2019-07-15 DIAGNOSIS — E1151 Type 2 diabetes mellitus with diabetic peripheral angiopathy without gangrene: Secondary | ICD-10-CM | POA: Diagnosis not present

## 2019-07-15 DIAGNOSIS — I428 Other cardiomyopathies: Secondary | ICD-10-CM | POA: Diagnosis not present

## 2019-07-15 DIAGNOSIS — I2510 Atherosclerotic heart disease of native coronary artery without angina pectoris: Secondary | ICD-10-CM | POA: Diagnosis not present

## 2019-07-15 DIAGNOSIS — G8222 Paraplegia, incomplete: Secondary | ICD-10-CM | POA: Diagnosis not present

## 2019-07-15 DIAGNOSIS — E785 Hyperlipidemia, unspecified: Secondary | ICD-10-CM | POA: Diagnosis not present

## 2019-07-15 DIAGNOSIS — I5042 Chronic combined systolic (congestive) and diastolic (congestive) heart failure: Secondary | ICD-10-CM | POA: Diagnosis not present

## 2019-07-15 DIAGNOSIS — N183 Chronic kidney disease, stage 3 unspecified: Secondary | ICD-10-CM | POA: Diagnosis not present

## 2019-07-15 DIAGNOSIS — I872 Venous insufficiency (chronic) (peripheral): Secondary | ICD-10-CM | POA: Diagnosis not present

## 2019-07-15 DIAGNOSIS — I130 Hypertensive heart and chronic kidney disease with heart failure and stage 1 through stage 4 chronic kidney disease, or unspecified chronic kidney disease: Secondary | ICD-10-CM | POA: Diagnosis not present

## 2019-07-17 ENCOUNTER — Telehealth: Payer: Self-pay | Admitting: Family Medicine

## 2019-07-17 DIAGNOSIS — G8222 Paraplegia, incomplete: Secondary | ICD-10-CM | POA: Diagnosis not present

## 2019-07-17 DIAGNOSIS — E1151 Type 2 diabetes mellitus with diabetic peripheral angiopathy without gangrene: Secondary | ICD-10-CM | POA: Diagnosis not present

## 2019-07-17 DIAGNOSIS — I872 Venous insufficiency (chronic) (peripheral): Secondary | ICD-10-CM | POA: Diagnosis not present

## 2019-07-17 DIAGNOSIS — E1122 Type 2 diabetes mellitus with diabetic chronic kidney disease: Secondary | ICD-10-CM | POA: Diagnosis not present

## 2019-07-17 DIAGNOSIS — I130 Hypertensive heart and chronic kidney disease with heart failure and stage 1 through stage 4 chronic kidney disease, or unspecified chronic kidney disease: Secondary | ICD-10-CM | POA: Diagnosis not present

## 2019-07-17 DIAGNOSIS — I2510 Atherosclerotic heart disease of native coronary artery without angina pectoris: Secondary | ICD-10-CM | POA: Diagnosis not present

## 2019-07-17 DIAGNOSIS — I350 Nonrheumatic aortic (valve) stenosis: Secondary | ICD-10-CM | POA: Diagnosis not present

## 2019-07-17 DIAGNOSIS — I428 Other cardiomyopathies: Secondary | ICD-10-CM | POA: Diagnosis not present

## 2019-07-17 DIAGNOSIS — C9000 Multiple myeloma not having achieved remission: Secondary | ICD-10-CM | POA: Diagnosis not present

## 2019-07-17 DIAGNOSIS — I5042 Chronic combined systolic (congestive) and diastolic (congestive) heart failure: Secondary | ICD-10-CM | POA: Diagnosis not present

## 2019-07-17 DIAGNOSIS — N183 Chronic kidney disease, stage 3 unspecified: Secondary | ICD-10-CM | POA: Diagnosis not present

## 2019-07-17 DIAGNOSIS — E785 Hyperlipidemia, unspecified: Secondary | ICD-10-CM | POA: Diagnosis not present

## 2019-07-17 DIAGNOSIS — I272 Pulmonary hypertension, unspecified: Secondary | ICD-10-CM | POA: Diagnosis not present

## 2019-07-17 NOTE — Chronic Care Management (AMB) (Signed)
  Chronic Care Management   Note  07/17/2019 Name: [name] MRN: [id number] DOB: [birth year]  [name] is a 74 y.o. year old female who is a primary care patient of Caryn Section, Kirstie Peri, MD. I reached out to [name] by phone today in response to a referral sent by Ms. Jeanice Lim Bellomo's health plan.     Ms. Debes spouse [name]  was given information about Chronic Care Management services today including:  1. CCM service includes personalized support from designated clinical staff supervised by her physician, including individualized plan of care and coordination with other care providers 2. 24/7 contact phone numbers for assistance for urgent and routine care needs. 3. Service will only be billed when office clinical staff spend 20 minutes or more in a month to coordinate care. 4. Only one practitioner may furnish and bill the service in a calendar month. 5. The patient may stop CCM services at any time (effective at the end of the month) by phone call to the office staff. 6. The patient will be responsible for cost sharing (co-pay) of up to 20% of the service fee (after annual deductible is met).  Patient's spouse did not agree to enrollment in care management services and does not wish to consider at this time.  Follow up plan: The patient's spouse has been provided with contact information for the care management team and has been advised to call with any health related questions or concerns.   Noreene Larsson, [place], [place], [place] [address] Direct Dial: [phone] [email or web address] Website: [email or web address]

## 2019-07-22 DIAGNOSIS — I5042 Chronic combined systolic (congestive) and diastolic (congestive) heart failure: Secondary | ICD-10-CM | POA: Diagnosis not present

## 2019-07-22 DIAGNOSIS — I130 Hypertensive heart and chronic kidney disease with heart failure and stage 1 through stage 4 chronic kidney disease, or unspecified chronic kidney disease: Secondary | ICD-10-CM | POA: Diagnosis not present

## 2019-07-22 DIAGNOSIS — E1122 Type 2 diabetes mellitus with diabetic chronic kidney disease: Secondary | ICD-10-CM | POA: Diagnosis not present

## 2019-07-22 DIAGNOSIS — I272 Pulmonary hypertension, unspecified: Secondary | ICD-10-CM | POA: Diagnosis not present

## 2019-07-22 DIAGNOSIS — I872 Venous insufficiency (chronic) (peripheral): Secondary | ICD-10-CM | POA: Diagnosis not present

## 2019-07-22 DIAGNOSIS — C9000 Multiple myeloma not having achieved remission: Secondary | ICD-10-CM | POA: Diagnosis not present

## 2019-07-22 DIAGNOSIS — E785 Hyperlipidemia, unspecified: Secondary | ICD-10-CM | POA: Diagnosis not present

## 2019-07-22 DIAGNOSIS — N183 Chronic kidney disease, stage 3 unspecified: Secondary | ICD-10-CM | POA: Diagnosis not present

## 2019-07-22 DIAGNOSIS — I428 Other cardiomyopathies: Secondary | ICD-10-CM | POA: Diagnosis not present

## 2019-07-22 DIAGNOSIS — G8222 Paraplegia, incomplete: Secondary | ICD-10-CM | POA: Diagnosis not present

## 2019-07-22 DIAGNOSIS — E1151 Type 2 diabetes mellitus with diabetic peripheral angiopathy without gangrene: Secondary | ICD-10-CM | POA: Diagnosis not present

## 2019-07-22 DIAGNOSIS — I350 Nonrheumatic aortic (valve) stenosis: Secondary | ICD-10-CM | POA: Diagnosis not present

## 2019-07-22 DIAGNOSIS — I2510 Atherosclerotic heart disease of native coronary artery without angina pectoris: Secondary | ICD-10-CM | POA: Diagnosis not present

## 2019-07-24 DIAGNOSIS — I130 Hypertensive heart and chronic kidney disease with heart failure and stage 1 through stage 4 chronic kidney disease, or unspecified chronic kidney disease: Secondary | ICD-10-CM | POA: Diagnosis not present

## 2019-07-24 DIAGNOSIS — E1122 Type 2 diabetes mellitus with diabetic chronic kidney disease: Secondary | ICD-10-CM | POA: Diagnosis not present

## 2019-07-24 DIAGNOSIS — E785 Hyperlipidemia, unspecified: Secondary | ICD-10-CM | POA: Diagnosis not present

## 2019-07-24 DIAGNOSIS — G8222 Paraplegia, incomplete: Secondary | ICD-10-CM | POA: Diagnosis not present

## 2019-07-24 DIAGNOSIS — I428 Other cardiomyopathies: Secondary | ICD-10-CM | POA: Diagnosis not present

## 2019-07-24 DIAGNOSIS — I5042 Chronic combined systolic (congestive) and diastolic (congestive) heart failure: Secondary | ICD-10-CM | POA: Diagnosis not present

## 2019-07-24 DIAGNOSIS — C9000 Multiple myeloma not having achieved remission: Secondary | ICD-10-CM | POA: Diagnosis not present

## 2019-07-24 DIAGNOSIS — E1151 Type 2 diabetes mellitus with diabetic peripheral angiopathy without gangrene: Secondary | ICD-10-CM | POA: Diagnosis not present

## 2019-07-24 DIAGNOSIS — N183 Chronic kidney disease, stage 3 unspecified: Secondary | ICD-10-CM | POA: Diagnosis not present

## 2019-07-24 DIAGNOSIS — I872 Venous insufficiency (chronic) (peripheral): Secondary | ICD-10-CM | POA: Diagnosis not present

## 2019-07-24 DIAGNOSIS — I272 Pulmonary hypertension, unspecified: Secondary | ICD-10-CM | POA: Diagnosis not present

## 2019-07-24 DIAGNOSIS — I350 Nonrheumatic aortic (valve) stenosis: Secondary | ICD-10-CM | POA: Diagnosis not present

## 2019-07-24 DIAGNOSIS — I2510 Atherosclerotic heart disease of native coronary artery without angina pectoris: Secondary | ICD-10-CM | POA: Diagnosis not present

## 2019-07-28 NOTE — Progress Notes (Signed)
Office Visit    Patient Name: [name] Date of Encounter: 07/29/2019  Primary Care Provider:  Birdie Sons, MD Primary Cardiologist:  Jillian Bush, MD  Chief Complaint    Chief Complaint  Patient presents with  . Follow-up    1 Month. Medications verbally reviewed with patient    74 year old female with history of nonobstructive CAD s/p 03/07/2016 L/RHC, NICM, chronic systolic and diastolic heart failure, hypertension, hyperlipidemia, aortic stenosis, DM (Metformin currently held), multiple myeloma, and who presents for 1 month follow-up of HFpEF.  Past Medical History    Past Medical History:  Diagnosis Date  . Aortic stenosis   . Asthma   . Coronary artery disease   . Diabetes mellitus without complication ([place])   . History of measles   . Hypertension   . Nonischemic cardiomyopathy ([place])   . Pulmonary hypertension ([place])    Past Surgical History:  Procedure Laterality Date  . CARDIAC CATHETERIZATION    . Cyst(solitary) of breast:removed    . RIGHT/LEFT HEART CATH AND CORONARY ANGIOGRAPHY N/A 03/07/2016   Procedure: Right/Left Heart Cath and Coronary Angiography;  Surgeon: Jillian Bush, MD;  Location: [facility];  Service: Cardiovascular;  Laterality: N/A;  . TUBAL LIGATION      Allergies  Allergies  Allergen Reactions  . Hydrochlorothiazide     Leg Cramps  . Penicillins Rash    History of Present Illness    [name] is a 74 y.o. female with PMH as above.    She has a history of nonobstructive CAD and chronic systolic and diastolic heart failure secondary to nonischemic cardiomyopathy.  She underwent LHC/RHC 03/07/2016 as below that showed nonobstructive disease.  TTE 04/18/2019 showed EF 60 to 65% with G1DD, very mild AS, and mild pulmonary hypertension.  She was seen by her primary cardiologist in April 2021 with noted improvement in her leg swelling.  Due to concerns about urinary frequency, however, furosemide was de-escalated to  40 mg every other day.  She was encouraged to continue working with the wound care clinic given her ongoing LEE.   She was seen again 05/23/2019 by her primary cardiologist and reported worsening lower extremity edema since changing her furosemide to 40 mg every other day.  She noted weeping present.  She was continuing to follow at the wound care center and receiving triple wraps on both legs.  She continued to be concerned regarding urinary frequency, especially when taking the furosemide.  She noted intermittent cramping in her legs, worsening with increased swelling since her last visit.  Lower extremity cramping was thought 2/2 worsening edema; however, lovastatin was held in the event of some element of statin myopathy.  Concern was noted regarding her increased volume status after every other day dosing. Following recheck of a BMET, her diuretic was increased back to daily furosemide 40 mg with discussion of possible future escalation if needed at RTC.  She was noted to remain very sedentary, unable to do most ADLs without assistance.  It was noted that she had not been advised to restart any medications for her DM by [name], given her Metformin had previously been held in the setting of AKI superimposed on CKD.  She was advised to reach out to [name] regarding this Metformin.  She was also advised to continue avoiding nephrotoxic agents, especially NSAIDs.  When last seen in clinic, she was tired and had spent all night in the recliner. Since increasing Lasix back to  40 mg daily and holding her lovastatin, she had noticed an improvement in her lower extremity myalgias.   he felt she was holding onto some volume, though also noted concern for urinary frequency. She was sedentary and unable to perform most ADLs.  Per husband, she became SOB with moving from her wheelchair to the toilet and back.  She continued to follow with the wound care clinic for LEE. Following labs, her lasix was increased to  23m daily.   Today, she presents to clinic and is observed to be more awake. She has not been taking furosemide 665mdaily due to losing sleep with need to wake during night for urination and husband's meed to transfer her to the toilet given overnight urinary frequency and thus his sleep loss as well. She has been taking lasix 4054maily. She denies chest pain, palpitations, increased dyspnea from baseline, pnd, increased baseline orthopnea, n, v, dizziness, syncope, further weight gain, or early satiety. She reports improving LEE and has "been released" from the wound clinic. She is not able to tolerate compression stockings given difficulty with getting them on per husband and with recommendation to investigate zip up compression stockings. She denies significant weeping with left tibial bandage in place. She has finished her last course of abx with today's bright erythema and weeping noted to be "chronic." She denies excessive intake of fluid or salt, though on further discussion, it is noted that she does eat chips with recommendation to cut these from her diet. They recently started meals on wheels "tv dinners" with husband unsure on the sodium content of these. She has recently been seen by home PT and increasing strength with PT/walking. Given ASA 325m34m medication list, clarified that this is given to pt for HA with recommendation to instead use Tylenol to avoid high dose ASA. Clarified that Tylenol is processed by the liver though preference indicated to continue ASA. Discussed asx bradycardia today given recently noted s/p increased BB with pt reporting she is asx and husband preference to remain at current dose BB even with tentative plan to restart amlodipine as below. No s/sx of bleeding. BP remains elevated today at 160/90.He is not monitoring pressures at home.  Home Medications    Prior to Admission medications   Medication Sig Start Date End Date Taking? Authorizing Provider  aspirin 325  MG tablet Take 325 mg by mouth every 6 (six) hours as needed (pain).   Yes [provider]  aspirin EC 81 MG tablet Take 1 tablet (81 mg total) by mouth daily. 03/02/16  Yes IngaWende Bushy  Cholecalciferol (VITAMIN D) 50 MCG (2000 UT) tablet Take 2,000 Units by mouth daily.   Yes [provider]  cyanocobalamin 500 MCG tablet Take 250 mcg by mouth daily.    Yes [provider]  furosemide (LASIX) 40 MG tablet Take 1 tablet (40 mg total) by mouth daily. 05/23/19  Yes End, ChriHarrell Gave  HYDROcodone-acetaminophen (NORCO) 10-325 MG tablet Take 1 tablet by mouth every 8 (eight) hours as needed. 05/31/18  Yes FishBirdie Watson  lisinopril (ZESTRIL) 20 MG tablet Take 20 mg by mouth daily.   Yes [provider]  metoprolol tartrate (LOPRESSOR) 50 MG tablet Take 1.5 tablets (75 mg total) by mouth 2 (two) times daily. 06/23/19  Yes VissMarrianne MoodPA-C  oxybutynin (DITROPAN) 5 MG tablet Take 1 tablet (5 mg total) by mouth 2 (two) times daily. 12/18/18  Yes EskrFestus Aloe    Review  of Systems    She denies chest pain, palpitations, pnd, increased orthopnea, n, v, dizziness, syncope, early satiety.  She reports unchanged SOB/DOE with minimal exertion and ongoing lower extremity edema though recently released by the  wound care clinic.  All other systems reviewed and are otherwise negative except as noted above.  Physical Exam    VS:  BP (!) 160/90 (BP Location: Right Arm, Patient Position: Sitting, Cuff Size: Normal)   Pulse 55   Ht _0  (1.651 m)   SpO2 95%   BMI 33.95 kg/m  , BMI Body mass index is 33.95 kg/m. GEN: Well nourished, well developed, in no acute distress.  Seated in wheelchair.  Joined by her husband.  Energy level improved from previous visit.  HEENT: normal.  Neck: Supple, JVP approximately 8 -10 cm.  No,carotid bruits, or masses. Cardiac: RRR, 1/6 systolic murmur.  No rubs or gallops. No clubbing, cyanosis.  Bilateral lower  extremities with bright erythema and L tibial bandage in place. No longer wrapped with ACE bandages. Ongoing 1-2+ pitting edema noted.  Radials/DP/PT 2+ and equal bilaterally.  Respiratory: Poor inspiratory effort, mildly diminished breath sounds throughout. GI: Soft, nontender, nondistended, BS + x 4. MS: no deformity or atrophy. Skin: warm and dry, bilateral LE erythema as above with some weeping on exam. Neuro:  Strength and sensation are intact. Psych: Normal affect. Improved energy.  Accessory Clinical Findings    ECG personally reviewed by me today - SB, 55bpm, LVH with repolarization abnormalities, QTc 410m, previously noted poor R wave progression - no acute changes from 05/23/19.  VITALS Reviewed today   Temp Readings from Last 3 Encounters:  06/30/19 (!) 97.1 F (36.2 C) (Temporal)  03/28/19 (!) 96.2 F (35.7 C) (Temporal)  09/27/18 (!) 96.6 F (35.9 C) (Temporal)   BP Readings from Last 3 Encounters:  07/29/19 (!) 160/90  06/30/19 (!) 154/84  06/27/19 (!) 172/83   Pulse Readings from Last 3 Encounters:  07/29/19 55  06/30/19 (!) 57  06/27/19 61    Wt Readings from Last 3 Encounters:  06/27/19 204 lb (92.5 kg)  06/23/19 206 lb (93.4 kg)  05/23/19 206 lb (93.4 kg)     LABS  reviewed today   Lab Results  Component Value Date   WBC 6.9 03/28/2019   HGB 9.0 (L) 03/28/2019   HCT 28.4 (L) 03/28/2019   MCV 91 03/28/2019   PLT 301 03/28/2019   Lab Results  Component Value Date   CREATININE 1.38 (H) 06/23/2019   BUN 19 06/23/2019   NA 139 06/23/2019   K 4.1 06/23/2019   CL 100 06/23/2019   CO2 24 06/23/2019   Lab Results  Component Value Date   ALT 6 03/28/2019   AST 9 03/28/2019   ALKPHOS 69 03/28/2019   BILITOT 0.2 03/28/2019   Lab Results  Component Value Date   CHOL 151 09/27/2018   HDL 48 09/27/2018   LDLCALC 84 09/27/2018   TRIG 105 09/27/2018   CHOLHDL 3.1 09/27/2018    Lab Results  Component Value Date   HGBA1C 6.9 (A) 06/30/2019    Lab Results  Component Value Date   TSH 0.482 05/29/2017     STUDIES/PROCEDURES reviewed today    Cardiovascular History & Procedures: Cardiovascular Problems:  Chronic systolic and diastolic heart failure secondary to non-ischemic cardiomyopathy  Nonobstructive coronary artery disease  Aortic stenosis  Risk Factors:  Known coronary artery disease, hypertension, hyperlipidemia, diabetes mellitus, sedentary lifestyle, obesity, and age > 670  Cath/PCI:  LHC/RHC (03/07/16): LMCA normal. LAD with 30% ostial stenosi, 30%midvessel narrowing, and 50% distal disease. LCx with 40% midvessel lesion. RCA with 30% proximal narrowing. LVEDP 26 mmHg. RA 18 (prominent "M" appearance), RV 82/21, PA mean 55, PCWP 25. Ao sat 96%, PA sat 54%, Fick Co/Ci 3.2/1.7. PVR 9.4 Wood units. No significant AoV gradient.  CV Surgery:  None  EP Procedures and Devices:  None  Non-Invasive Evaluation(s):  TTE (04/18/2019): Normal LV size with mild LVH.  LVEF 60-65% with grade 1 diastolic dysfunction.  Normal RV size and function.  Mild pulmonary hypertension.  Very mild aortic stenosis.  ABI's (07/26/16):Right:ABI notobtained, TBI 1.8. Left:ABI 1.9, TBI 1.0.Triphasic waveforms noted in both lower extremities.  TTE (07/26/16): Normal LV size with moderate LVH. LVEF 55-60% with normal wall motion. Grade 1 diastolic dysfunction. Mild aortic stenosis (mean gradient 11 mmHg). Mitral annular calcification with mild to moderate MR. Mild left atrial enlargement. Normal RV size and function. Normal PA pressure.  TTE (02/25/16): Normal LV size with moderate LVH. LVEF 25-30% with grade 2 diastolic dysfunction. Thickened aortic valve with likely moderate stenosis. MAC with mild MR. Mild LA enlargement. Mildly to moderately reduced RV contraction. Mild-moderate TR. Moderately PH (RVSP 55 mmHg).  Assessment & Plan    Chronic HFrEF --Reports ongoing / unchanged SOB/DOE with consideration of deconditioning.  Echo above with EF 60-65% and G1DD. Volume exam challenging due to body habitus. Escalation of diuresis precluded by desire to avoid urinary frequency. Recheck BMET per pt request and as pt reports that twin sister with recent kidney issues, and that worries her. Continue pt reduced lasix 73m daily. Asymptomatic bradycardia today with pt preference to continue current dose BB despite bradycardia given asx. If worsening bradycardia +/- sx of presyncope or fatigue, she will call the office. Will restart low dose amlodipine 2.578mtoday for additional BP control (low dose given h/o LEE with this rx). If tolerated, increase as tolerated for further BP support. Agreeable to monitor BP for at least a few days following start of amlodipine to assist with titration. Continue lisinopril 2080maily as pt does not wish to increase dose given h/o AKI. Pt preference is to hold off on restart of spironolactone given it was looked up and discovered to have a diuretic effect. Reassess BP at RTC as discussed that uncontrolled BP will influence kidneys over time as well. Also discussed restart of metformin as uncontrolled DM2 can influence health of kidneys as well. Lifestyle changes with diet / exercise  discussed.   HTN, poorly controlled --BP elevated at 160/90 and worse than previous clinic visit and despite increase to metoprolol tartrate 62m20mD. Given bradycardia with increased BB, discussed decreasing back to previous BB dose with pt preference to defer this change. Start amlodipine 2.5 mg daily at low dose given report of lower extremity edema in past and escalate as tolerated for BP support and goal BP 130/80 or below.  Recommend monitor blood pressure at home with agreement to do so at least for a few days after start of CCB.  Continue lisinopril 20mg39mly with pt preference to defer increasing this medication / start of spironolactone.  Will recheck BMET per patient request. Low salt diet and fluid restriction under  2 L. Discussed suspicion that ongoing BP issues likely related to volume status and salt intake given report of frequent urination with large amounts of urine with each trip. Encouraged to monitor for hidden sources of fluid and salt intake.   AS --Very  mild on most recent echo. Control BP/HR. Continue to monitor sx.  CKD --Still holding metformin with restart per PCP.  Avoid nephrotoxins, including NSAIDs.  Clarified Tylenol is not renally processed.   High dose ASA --Recommended that she stop high dose ASA, given risk of bleeding, including GIB. Reportedly taken for HA with recommendation to instead use Tylenol as this is processed by the liver.   Leg pain --Improved myalgias / leg pain. Unclear if leg pain improved with holding statin or increased diuresis; therefore, per pt preference today, continue to hold statin for now.    DM2 --Restart of metformin per PCP.  Medication changes: Restart low dose amlodipine 2.4m qd, escalate as tolerated (h/o LEE). If BP / HR /LEE allow, increase to 529mbefore next visit. Discontinue high dose ASA. Continue ASA 8160md. Restart Metformin per PCP.  Labs ordered: BMET per pt request Studies / Imaging ordered: None  Future considerations: Reassess pt decision regarding further up-titration of diuresis / antihypertensives, restart of statin / Metformin. Decrease BB if needed for ongoing or symptomatic bradycardia. Disposition: RTC 2 months     JacArvil ChacoA-C 07/29/2019

## 2019-07-29 ENCOUNTER — Other Ambulatory Visit: Payer: Self-pay

## 2019-07-29 ENCOUNTER — Encounter: Payer: Self-pay | Admitting: Physician Assistant

## 2019-07-29 ENCOUNTER — Ambulatory Visit: Payer: Medicare Other | Admitting: Physician Assistant

## 2019-07-29 VITALS — BP 160/90 | HR 55 | Ht 65.0 in

## 2019-07-29 DIAGNOSIS — I5022 Chronic systolic (congestive) heart failure: Secondary | ICD-10-CM | POA: Diagnosis not present

## 2019-07-29 DIAGNOSIS — I10 Essential (primary) hypertension: Secondary | ICD-10-CM | POA: Diagnosis not present

## 2019-07-29 DIAGNOSIS — N183 Chronic kidney disease, stage 3 unspecified: Secondary | ICD-10-CM

## 2019-07-29 DIAGNOSIS — I872 Venous insufficiency (chronic) (peripheral): Secondary | ICD-10-CM

## 2019-07-29 DIAGNOSIS — E1122 Type 2 diabetes mellitus with diabetic chronic kidney disease: Secondary | ICD-10-CM

## 2019-07-29 DIAGNOSIS — Z79899 Other long term (current) drug therapy: Secondary | ICD-10-CM

## 2019-07-29 DIAGNOSIS — I428 Other cardiomyopathies: Secondary | ICD-10-CM

## 2019-07-29 DIAGNOSIS — E785 Hyperlipidemia, unspecified: Secondary | ICD-10-CM

## 2019-07-29 MED ORDER — AMLODIPINE BESYLATE 2.5 MG PO TABS
2.5000 mg | ORAL_TABLET | Freq: Every day | ORAL | 1 refills | Status: DC
Start: 2019-07-29 — End: 2019-10-06

## 2019-07-29 MED ORDER — AMLODIPINE BESYLATE 5 MG PO TABS
5.0000 mg | ORAL_TABLET | Freq: Every day | ORAL | 1 refills | Status: DC
Start: 2019-07-29 — End: 2019-07-29

## 2019-07-29 MED ORDER — FUROSEMIDE 40 MG PO TABS: 40.0000 mg | ORAL_TABLET | Freq: Every day | ORAL | 1 refills | Status: DC

## 2019-07-29 NOTE — Patient Instructions (Addendum)
Medication Instructions:  Your physician has recommended you make the following change in your medication:  1- START Amlodipine 2.5 mg (1 tablet) by mouth once a day. If you tolerate it well, then you may increase to 5 mg daily.  2- OK to decrease Furosemide to 40 mg by mouth once a day .  *If you need a refill on your cardiac medications before your next appointment, please call your pharmacy*  Lab: Your physician recommends that you return for lab work in: [place] - BMET.   Follow-Up: At [facility], you and your health needs are our priority.  As part of our continuing mission to provide you with exceptional heart care, we have created designated Provider Care Teams.  These Care Teams include your primary Cardiologist (physician) and Advanced Practice Providers (APPs -  Physician Assistants and Nurse Practitioners) who all work together to provide you with the care you need, when you need it.  We recommend signing up for the patient portal called "MyChart".  Sign up information is provided on this After Visit Summary.  MyChart is used to connect with patients for Virtual Visits (Telemedicine).  Patients are able to view lab/test results, encounter notes, upcoming appointments, etc.  Non-urgent messages can be sent to your provider as well.   To learn more about what you can do with MyChart, go to [email or web address].    Your next appointment:   2 month(s)  The format for your next appointment:   In Person  Provider:    You may see Nelva Bush, MD or one of the following Advanced Practice Providers on your designated Care Team:    Murray Hodgkins, NP  Christell Faith, PA-C  Marrianne Mood, PA-C    Other Instructions Please monitor your blood pressure and heart rate at home. It is recommended you record your readings about 2 hours after your morning dose of medications. Please make sure you have been sitting for 5-10 minutes, legs not crossed, and you are relaxed.

## 2019-07-30 LAB — BASIC METABOLIC PANEL
BUN/Creatinine Ratio: 22 (ref 12–28)
BUN: 32 mg/dL — ABNORMAL HIGH (ref 8–27)
CO2: 24 mmol/L (ref 20–29)
Calcium: 9.8 mg/dL (ref 8.7–10.3)
Chloride: 101 mmol/L (ref 96–106)
Creatinine, Ser: 1.44 mg/dL — ABNORMAL HIGH (ref 0.57–1.00)
GFR calc Af Amer: 41 mL/min/{1.73_m2} — ABNORMAL LOW (ref >59)
GFR calc non Af Amer: 36 mL/min/{1.73_m2} — ABNORMAL LOW (ref >59)
Glucose: 135 mg/dL — ABNORMAL HIGH (ref 65–99)
Potassium: 4.3 mmol/L (ref 3.5–5.2)
Sodium: 140 mmol/L (ref 134–144)

## 2019-07-31 ENCOUNTER — Telehealth: Payer: Self-pay

## 2019-07-31 NOTE — Telephone Encounter (Signed)
-----   Message from Arvil Chaco, PA-C sent at 07/30/2019 10:42 PM EDT ----- Renal function with very slight increase from previous labs taken 1 month ago but still stable and well within normal baseline range. Potassium stable. Reassuring labs. No changes to medications or visit plan as outlined in clinic note.

## 2019-07-31 NOTE — Telephone Encounter (Signed)
Call attempted. vm not available.

## 2019-08-01 NOTE — Telephone Encounter (Signed)
Call to patient to review labs.    Pt spouse verbalized understanding and has no further questions at this time.    Advised pt to call for any further questions or concerns.  No further orders.

## 2019-08-15 ENCOUNTER — Other Ambulatory Visit: Payer: Self-pay | Admitting: Internal Medicine

## 2019-09-12 DIAGNOSIS — E785 Hyperlipidemia, unspecified: Secondary | ICD-10-CM

## 2019-09-12 DIAGNOSIS — I872 Venous insufficiency (chronic) (peripheral): Secondary | ICD-10-CM

## 2019-09-12 DIAGNOSIS — I130 Hypertensive heart and chronic kidney disease with heart failure and stage 1 through stage 4 chronic kidney disease, or unspecified chronic kidney disease: Secondary | ICD-10-CM | POA: Diagnosis not present

## 2019-09-12 DIAGNOSIS — E1151 Type 2 diabetes mellitus with diabetic peripheral angiopathy without gangrene: Secondary | ICD-10-CM

## 2019-09-12 DIAGNOSIS — E1122 Type 2 diabetes mellitus with diabetic chronic kidney disease: Secondary | ICD-10-CM | POA: Diagnosis not present

## 2019-09-12 DIAGNOSIS — I5042 Chronic combined systolic (congestive) and diastolic (congestive) heart failure: Secondary | ICD-10-CM | POA: Diagnosis not present

## 2019-09-12 DIAGNOSIS — G8222 Paraplegia, incomplete: Secondary | ICD-10-CM

## 2019-09-12 DIAGNOSIS — N183 Chronic kidney disease, stage 3 unspecified: Secondary | ICD-10-CM | POA: Diagnosis not present

## 2019-09-12 DIAGNOSIS — C9000 Multiple myeloma not having achieved remission: Secondary | ICD-10-CM | POA: Diagnosis not present

## 2019-09-12 DIAGNOSIS — I350 Nonrheumatic aortic (valve) stenosis: Secondary | ICD-10-CM

## 2019-09-29 ENCOUNTER — Telehealth: Payer: Self-pay | Admitting: Family Medicine

## 2019-09-29 NOTE — Telephone Encounter (Signed)
Copied from [place] [phone]. Topic: Medicare AWV >> Sep 29, 2019  2:24 PM Cher Nakai R wrote: Reason for CRM:  Left message for patient to call back and schedule Medicare Annual Wellness Visit (AWV) either virtually or in office.  Last AWV 09/26/2018  Please schedule at anytime with [facility] Health Advisor.  If any questions, please contact me at [phone]

## 2019-09-29 NOTE — Telephone Encounter (Signed)
Mr ade called and stated that they will schedule AWV tomorrow while in office

## 2019-09-29 NOTE — Progress Notes (Signed)
Established patient visit   Patient: [name]   DOB: Jan 06, 1945   74 y.o. Female  MRN: [id number] Visit Date: 09/30/2019  Today's healthcare provider: Lelon Huh, MD   Chief Complaint  Patient presents with  . Diabetes   Subjective    HPI  Diabetes Mellitus Type II, Follow-up  Lab Results  Component Value Date   HGBA1C 7.4 (A) 09/30/2019   HGBA1C 6.9 (A) 06/30/2019   HGBA1C 6.6 (A) 03/28/2019   Wt Readings from Last 3 Encounters:  06/27/19 204 lb (92.5 kg)  06/23/19 206 lb (93.4 kg)  05/23/19 206 lb (93.4 kg)   Last seen for diabetes 6 months ago.  Management since then includes no changes. She reports good compliance with treatment. She is not having side effects.   Home blood sugar records: trend: stable  Episodes of hypoglycemia? No    Current insulin regiment: none Most Recent Eye Exam: due  Current exercise: none Current diet habits: well balanced  Pertinent Labs: Lab Results  Component Value Date   CHOL 151 09/27/2018   HDL 48 09/27/2018   LDLCALC 84 09/27/2018   TRIG 105 09/27/2018   CHOLHDL 3.1 09/27/2018   Lab Results  Component Value Date   NA 140 07/29/2019   K 4.3 07/29/2019   CREATININE 1.44 (H) 07/29/2019   GFRNONAA 36 (L) 07/29/2019   GFRAA 41 (L) 07/29/2019   GLUCOSE 135 (H) 07/29/2019      Chronic edema She and her husband report that bilateral leg swelling and seeping has gotten progressively worse for the last few months. She does not elevate her legs and does not wear compression stockings that she was prescribed. He also stopped giving her furosemide because it keeps her up all not urinating when she takes it. She has history HFpEF and has follow up with cardiology next week.   Her husband reports that her asthma is back and she has been requiring albuterol several times a day for the last couple of months. She is wheezing frequently and activity is limited by shortness of breath. She was previously on Breo which he  reports worked well for her.        Medications: Outpatient Medications Prior to Visit  Medication Sig  . albuterol (VENTOLIN HFA) 108 (90 Base) MCG/ACT inhaler Inhale 1-2 puffs into the lungs every 6 (six) hours as needed for shortness of breath.  Marland Kitchen amLODipine (NORVASC) 2.5 MG tablet Take 1 tablet (2.5 mg total) by mouth daily.  Marland Kitchen aspirin EC 81 MG tablet Take 1 tablet (81 mg total) by mouth daily.  . Cholecalciferol (VITAMIN D) 50 MCG (2000 UT) tablet Take 2,000 Units by mouth daily.  . cyanocobalamin 500 MCG tablet Take 250 mcg by mouth daily.   . furosemide (LASIX) 40 MG tablet Take 1 tablet (40 mg total) by mouth daily.  Marland Kitchen lisinopril (ZESTRIL) 20 MG tablet TAKE 1 TABLET BY MOUTH EVERY DAY  . metoprolol tartrate (LOPRESSOR) 50 MG tablet Take 1.5 tablets (75 mg total) by mouth 2 (two) times daily.  Marland Kitchen oxybutynin (DITROPAN) 5 MG tablet Take 1 tablet (5 mg total) by mouth 2 (two) times daily.  Marland Kitchen aspirin 325 MG tablet Take 325 mg by mouth every 6 (six) hours as needed (pain). (Patient not taking: Reported on 09/30/2019)  . HYDROcodone-acetaminophen (NORCO) 10-325 MG tablet Take 1 tablet by mouth every 8 (eight) hours as needed. (Patient not taking: Reported on 09/30/2019)   No facility-administered medications prior to visit.  Review of Systems  Constitutional: Negative.   Respiratory: Negative.   Cardiovascular: Negative.   Endocrine: Negative.   Neurological: Negative.       Objective    BP (!) 148/77   Pulse (!) 120   Temp 98 F (36.7 C)    Physical Exam    General: Appearance:    Severely obese female in no acute distress  Eyes:    PERRL, conjunctiva/corneas clear, EOM's intact       Lungs:     Clear to auscultation bilaterally, respirations unlabored  Heart:    Tachycardic. Normal rhythm.  3/6 harsh, crescendo-decrescendo, systolic murmur at right upper sternal border  MS:   All extremities are intact.  4+ bilateral lower extremity edema with moderate clear  seeping.   Neurologic:   Awake, alert, oriented x 3. No apparent focal neurological           defect.        Results for orders placed or performed in visit on 09/30/19  POCT glycosylated hemoglobin (Hb A1C)  Result Value Ref Range   Hemoglobin A1C 7.4 (A) 4.0 - 5.6 %   HbA1c POC (<> result, manual entry)     HbA1c, POC (prediabetic range)     HbA1c, POC (controlled diabetic range)     Est. average glucose Bld gHb Est-mCnc 166     Assessment & Plan     1. Diabetes mellitus with nephropathy (HCC) a1c is up a bit today. She is to work on increasing activity and reducing calories, Continue current medications for now. Recheck a1c in 4 months.   2. Bilateral lower extremity edema Much worse the last several weeks and has not been taking furosemide consistently due to nocturia. She was strongly counseled on importance of mechanical treatments including elevation of LEs and compression stockings, but she really needs to take furosemide consistently due to CHF history.   3. Dyspnea, unspecified type She has started back on albuterol several times a day. They would like to restart Breo which she took in the past for asthma. Prescription was sent to pharmacy, but I'm concerned symptoms may have more to due with HF since she has not been taking furosemide.  - CBC - Comprehensive metabolic panel - B Nat Peptide  - fluticasone furoate-vilanterol (BREO ELLIPTA) 200-25 MCG/INH AEPB; Inhale 1 puff into the lungs daily.  Dispense: 28 each; Refill: 3   4. Coronary artery disease involving native coronary artery of native heart without angina pectoris No chest pains, continue statin.  - Lipid panel  Follow up cardiology next week as scheduled.   5. Vitamin D deficiency  6. Osteopenia, unspecified location - VITAMIN D 25 Hydroxy (Vit-D Deficiency, Fractures)  7. Need for influenza vaccination  - Flu Vaccine QUAD High Dose IM (Fluad)      The entirety of the information documented in the  History of Present Illness, Review of Systems and Physical Exam were personally obtained by me. Portions of this information were initially documented by the CMA and reviewed by me for thoroughness and accuracy.      Lelon Huh, MD  [facility] [phone] (phone) [phone] (fax)  [place]

## 2019-09-30 ENCOUNTER — Ambulatory Visit (INDEPENDENT_AMBULATORY_CARE_PROVIDER_SITE_OTHER): Payer: Medicare Other | Admitting: Family Medicine

## 2019-09-30 ENCOUNTER — Encounter: Payer: Self-pay | Admitting: Family Medicine

## 2019-09-30 ENCOUNTER — Other Ambulatory Visit: Payer: Self-pay

## 2019-09-30 VITALS — BP 148/77 | HR 120 | Temp 98.0°F

## 2019-09-30 DIAGNOSIS — Z23 Encounter for immunization: Secondary | ICD-10-CM

## 2019-09-30 DIAGNOSIS — I2510 Atherosclerotic heart disease of native coronary artery without angina pectoris: Secondary | ICD-10-CM

## 2019-09-30 DIAGNOSIS — E1121 Type 2 diabetes mellitus with diabetic nephropathy: Secondary | ICD-10-CM | POA: Diagnosis not present

## 2019-09-30 DIAGNOSIS — M8580 Other specified disorders of bone density and structure, unspecified site: Secondary | ICD-10-CM

## 2019-09-30 DIAGNOSIS — E559 Vitamin D deficiency, unspecified: Secondary | ICD-10-CM | POA: Diagnosis not present

## 2019-09-30 DIAGNOSIS — R0600 Dyspnea, unspecified: Secondary | ICD-10-CM | POA: Diagnosis not present

## 2019-09-30 DIAGNOSIS — D649 Anemia, unspecified: Secondary | ICD-10-CM | POA: Diagnosis not present

## 2019-09-30 DIAGNOSIS — R609 Edema, unspecified: Secondary | ICD-10-CM | POA: Diagnosis not present

## 2019-09-30 DIAGNOSIS — R600 Localized edema: Secondary | ICD-10-CM

## 2019-09-30 LAB — POCT GLYCOSYLATED HEMOGLOBIN (HGB A1C)
Est. average glucose Bld gHb Est-mCnc: 166
Hemoglobin A1C: 7.4 % — AB (ref 4.0–5.6)

## 2019-09-30 MED ORDER — FLUTICASONE FUROATE-VILANTEROL 200-25 MCG/INH IN AEPB: 1.0000 | INHALATION_SPRAY | Freq: Every day | RESPIRATORY_TRACT | 3 refills | Status: DC

## 2019-10-01 LAB — LIPID PANEL
Chol/HDL Ratio: 4 ratio (ref 0.0–4.4)
Cholesterol, Total: 164 mg/dL (ref 100–199)
HDL: 41 mg/dL (ref >39)
LDL Chol Calc (NIH): 98 mg/dL (ref 0–99)
Triglycerides: 140 mg/dL (ref 0–149)
VLDL Cholesterol Cal: 25 mg/dL (ref 5–40)

## 2019-10-01 LAB — COMPREHENSIVE METABOLIC PANEL
ALT: 4 IU/L (ref 0–32)
AST: 13 IU/L (ref 0–40)
Albumin/Globulin Ratio: 0.8 — ABNORMAL LOW (ref 1.2–2.2)
Albumin: 3.4 g/dL — ABNORMAL LOW (ref 3.7–4.7)
Alkaline Phosphatase: 89 IU/L (ref 44–121)
BUN/Creatinine Ratio: 16 (ref 12–28)
BUN: 22 mg/dL (ref 8–27)
Bilirubin Total: <0.2 mg/dL (ref 0.0–1.2)
CO2: 23 mmol/L (ref 20–29)
Calcium: 9.5 mg/dL (ref 8.7–10.3)
Chloride: 105 mmol/L (ref 96–106)
Creatinine, Ser: 1.34 mg/dL — ABNORMAL HIGH (ref 0.57–1.00)
GFR calc Af Amer: 45 mL/min/{1.73_m2} — ABNORMAL LOW (ref >59)
GFR calc non Af Amer: 39 mL/min/{1.73_m2} — ABNORMAL LOW (ref >59)
Globulin, Total: 4.4 g/dL (ref 1.5–4.5)
Glucose: 118 mg/dL — ABNORMAL HIGH (ref 65–99)
Potassium: 4.7 mmol/L (ref 3.5–5.2)
Sodium: 140 mmol/L (ref 134–144)
Total Protein: 7.8 g/dL (ref 6.0–8.5)

## 2019-10-01 LAB — CBC
Hematocrit: 26.1 % — ABNORMAL LOW (ref 34.0–46.6)
Hemoglobin: 8.3 g/dL — ABNORMAL LOW (ref 11.1–15.9)
MCH: 27.5 pg (ref 26.6–33.0)
MCHC: 31.8 g/dL (ref 31.5–35.7)
MCV: 86 fL (ref 79–97)
Platelets: 333 10*3/uL (ref 150–450)
RBC: 3.02 x10E6/uL — ABNORMAL LOW (ref 3.77–5.28)
RDW: 14.6 % (ref 11.7–15.4)
WBC: 6.8 10*3/uL (ref 3.4–10.8)

## 2019-10-01 LAB — VITAMIN D 25 HYDROXY (VIT D DEFICIENCY, FRACTURES): Vit D, 25-Hydroxy: 39.3 ng/mL (ref 30.0–100.0)

## 2019-10-01 LAB — BRAIN NATRIURETIC PEPTIDE: BNP: 138.6 pg/mL — ABNORMAL HIGH (ref 0.0–100.0)

## 2019-10-02 ENCOUNTER — Telehealth: Payer: Self-pay

## 2019-10-02 NOTE — Telephone Encounter (Signed)
Iron panel added to patient's labs.

## 2019-10-05 LAB — IRON,TIBC AND FERRITIN PANEL
Ferritin: 105 ng/mL (ref 15–150)
Iron Saturation: 16 % (ref 15–55)
Iron: 34 ug/dL (ref 27–139)
Total Iron Binding Capacity: 209 ug/dL — ABNORMAL LOW (ref 250–450)
UIBC: 175 ug/dL (ref 118–369)

## 2019-10-05 LAB — SPECIMEN STATUS REPORT

## 2019-10-06 ENCOUNTER — Encounter: Payer: Self-pay | Admitting: Internal Medicine

## 2019-10-06 ENCOUNTER — Ambulatory Visit (INDEPENDENT_AMBULATORY_CARE_PROVIDER_SITE_OTHER): Payer: Medicare Other | Admitting: Internal Medicine

## 2019-10-06 ENCOUNTER — Other Ambulatory Visit: Payer: Self-pay

## 2019-10-06 VITALS — BP 110/62 | HR 62 | Ht 64.0 in | Wt 200.0 lb

## 2019-10-06 DIAGNOSIS — I350 Nonrheumatic aortic (valve) stenosis: Secondary | ICD-10-CM

## 2019-10-06 DIAGNOSIS — I5022 Chronic systolic (congestive) heart failure: Secondary | ICD-10-CM

## 2019-10-06 DIAGNOSIS — D649 Anemia, unspecified: Secondary | ICD-10-CM

## 2019-10-06 DIAGNOSIS — Z8579 Personal history of other malignant neoplasms of lymphoid, hematopoietic and related tissues: Secondary | ICD-10-CM | POA: Diagnosis not present

## 2019-10-06 DIAGNOSIS — I10 Essential (primary) hypertension: Secondary | ICD-10-CM | POA: Diagnosis not present

## 2019-10-06 DIAGNOSIS — I2510 Atherosclerotic heart disease of native coronary artery without angina pectoris: Secondary | ICD-10-CM | POA: Diagnosis not present

## 2019-10-06 MED ORDER — AMLODIPINE BESYLATE 2.5 MG PO TABS: 2.5000 mg | ORAL_TABLET | Freq: Every day | ORAL | Status: DC

## 2019-10-06 NOTE — Patient Instructions (Signed)
Medication Instructions:  Your physician has recommended you make the following change in your medication:  1- STOP Aspirin. 2- DECREASE Amlodipine to 2.5 mg by mouth once a day.  *If you need a refill on your cardiac medications before your next appointment, please call your pharmacy*  Follow-Up: At [facility], you and your health needs are our priority.  As part of our continuing mission to provide you with exceptional heart care, we have created designated Provider Care Teams.  These Care Teams include your primary Cardiologist (physician) and Advanced Practice Providers (APPs -  Physician Assistants and Nurse Practitioners) who all work together to provide you with the care you need, when you need it.  We recommend signing up for the patient portal called "MyChart".  Sign up information is provided on this After Visit Summary.  MyChart is used to connect with patients for Virtual Visits (Telemedicine).  Patients are able to view lab/test results, encounter notes, upcoming appointments, etc.  Non-urgent messages can be sent to your provider as well.   To learn more about what you can do with MyChart, go to [email or web address].    Your next appointment:   3 month(s)  The format for your next appointment:   In Person  Provider:   You may see Nelva Bush, MD or one of the following Advanced Practice Providers on your designated Care Team:    Murray Hodgkins, NP  Christell Faith, PA-C  Marrianne Mood, PA-C  [facility] [place], [place]

## 2019-10-06 NOTE — Progress Notes (Signed)
Follow-up Outpatient Visit Date: 10/06/2019  Primary Care Provider: Birdie Sons, MD [address] [place] [address]  Chief Complaint: Follow-up CAD, heart failure, and aortic stenosis  HPI:  [name] is a 74 y.o. female with history of non-obstructive CAD, NICM with chronic systolic and diastolic heart failure, HTN, DM, and multiple myeloma, who presents for follow-up of heart failure.  She was last seen in our office in late July by Marrianne Mood, PA.  She was previously advised to take furosemide 60 mg daily but was only taking 40 mg due to inconvenience related to her urinary frequency.  [name] noted that her leg swelling had improved.  She was started on amlodipine 2.5 mg daily due to suboptimally controlled blood pressure.  Recent labs showed stable renal function.  Significant anemia with hemoglobin of 8.3 was noted.  Today, [name] has a few concerns.  Her leg edema and wounds has gotten a little bit worse since her last visit.  She remains compliant with furosemide 40 mg daily but does not elevate her legs regularly or apply compression stockings.  After her last visit, [name] began taking amlodipine 5 mg daily again, rather than the 2.5 mg daily recommended by Ms. Visser.  [name] has also experienced intermittent shortness of breath and wheezing, which she attributes to her asthma.  Symptoms resolved promptly with use of her albuterol MDI.  She has been unable to procure Breo due to cost.  She denies chest pain and palpitations.  She reports stable weight at home.  Higher doses of furosemide remain problematic due to difficulty getting to the bathroom.  [name] requires significant assistance from her husband to reach the bathroom, including at night.  --------------------------------------------------------------------------------------------------  Cardiovascular History & Procedures: Cardiovascular Problems:  Chronic systolic and diastolic  heart failure secondary to non-ischemic cardiomyopathy  Nonobstructive coronary artery disease  Aortic stenosis  Risk Factors:  Known coronary artery disease, hypertension, hyperlipidemia, diabetes mellitus, sedentary lifestyle, obesity, and age > 42  Cath/PCI:  LHC/RHC (03/07/16): LMCA normal. LAD with 30% ostial stenosi, 30%midvessel narrowing, and 50% distal disease. LCx with 40% midvessel lesion. RCA with 30% proximal narrowing. LVEDP 26 mmHg. RA 18 (prominent "M" appearance), RV 82/21, PA mean 55, PCWP 25. Ao sat 96%, PA sat 54%, Fick Co/Ci 3.2/1.7. PVR 9.4 Wood units. No significant AoV gradient.  CV Surgery:  None  EP Procedures and Devices:  None  Non-Invasive Evaluation(s):  TTE (04/18/2019): Normal LV size with mild LVH.  LVEF 60-65% with grade 1 diastolic dysfunction.  Normal RV size and function.  Mild pulmonary hypertension.  Very mild aortic stenosis.  ABI's (07/26/16):Right:ABI notobtained, TBI 1.8. Left:ABI 1.9, TBI 1.0.Triphasic waveforms noted in both lower extremities.  TTE (07/26/16): Normal LV size with moderate LVH. LVEF 55-60% with normal wall motion. Grade 1 diastolic dysfunction. Mild aortic stenosis (mean gradient 11 mmHg). Mitral annular calcification with mild to moderate MR. Mild left atrial enlargement. Normal RV size and function. Normal PA pressure.  TTE (02/25/16): Normal LV size with moderate LVH. LVEF 25-30% with grade 2 diastolic dysfunction. Thickened aortic valve with likely moderate stenosis. MAC with mild MR. Mild LA enlargement. Mildly to moderately reduced RV contraction. Mild-moderate TR. Moderately PH (RVSP 55 mmHg).  Recent CV Pertinent Labs: Lab Results  Component Value Date   CHOL 164 09/30/2019   HDL 41 09/30/2019   LDLCALC 98 09/30/2019   TRIG 140 09/30/2019   CHOLHDL 4.0 09/30/2019   INR 1.0 03/02/2016  BNP 138.6 (H) 09/30/2019   BNP 1,592.0 (H) 02/09/2016   K 4.7 09/30/2019   BUN 22 09/30/2019   CREATININE 1.34 (H)  09/30/2019   CREATININE 1.31 (H) 12/21/2016    Past medical and surgical history were reviewed and updated in EPIC.  Current Meds  Medication Sig  . albuterol (VENTOLIN HFA) 108 (90 Base) MCG/ACT inhaler Inhale 1-2 puffs into the lungs every 6 (six) hours as needed for shortness of breath.  Marland Kitchen amLODipine (NORVASC) 2.5 MG tablet Take 1 tablet (2.5 mg total) by mouth daily.  . Cholecalciferol (VITAMIN D) 50 MCG (2000 UT) tablet Take 2,000 Units by mouth daily.  . cyanocobalamin 500 MCG tablet Take 250 mcg by mouth daily.   . fluticasone furoate-vilanterol (BREO ELLIPTA) 200-25 MCG/INH AEPB Inhale 1 puff into the lungs daily.  . furosemide (LASIX) 40 MG tablet Take 1 tablet (40 mg total) by mouth daily.  Marland Kitchen lisinopril (ZESTRIL) 20 MG tablet TAKE 1 TABLET BY MOUTH EVERY DAY  . metoprolol tartrate (LOPRESSOR) 50 MG tablet Take 1.5 tablets (75 mg total) by mouth 2 (two) times daily.  Marland Kitchen oxybutynin (DITROPAN) 5 MG tablet Take 1 tablet (5 mg total) by mouth 2 (two) times daily.  . [DISCONTINUED] amLODipine (NORVASC) 5 MG tablet Take 5 mg by mouth daily.  . [DISCONTINUED] aspirin EC 81 MG tablet Take 1 tablet (81 mg total) by mouth daily.    Allergies: Hydrochlorothiazide and Penicillins  Social History   Tobacco Use  . Smoking status: Never Smoker  . Smokeless tobacco: Never Used  Vaping Use  . Vaping Use: Never used  Substance Use Topics  . Alcohol use: No  . Drug use: No    Family History  Problem Relation Age of Onset  . Cancer Mother        Uterine cancer  . Diabetes Mother   . Heart disease Mother   . Heart failure Mother   . Parkinson's disease Father   . Cancer Sister        breast cancer  . Diabetes Brother        Non-insulin Dependent Diabetes Mellitus  . Mesothelioma Brother   . Hypertension Sister   . Diabetes Sister        Non-insulin dependent Diabetes Mellitus    Review of Systems: A 12-system review of systems was performed and was negative except as noted in  the HPI.  --------------------------------------------------------------------------------------------------  Physical Exam: BP 110/62 (BP Location: Left Arm, Patient Position: Sitting, Cuff Size: Normal)   Pulse 62   Ht _0  (1.626 m)   Wt 200 lb (90.7 kg)   SpO2 98%   BMI 34.33 kg/m   General: NAD.  Seated in a wheelchair and neck: No gross JVD or HJR, though body habitus limits evaluation. Lungs: Mildly diminished throughout without wheezes or crackles. Heart: Regular rate and rhythm with 3/6 systolic murmur.  No rubs or gallops. Abdomen: Soft, nontender, nondistended. Extremities: 2+ pitting edema to the proximal calves bilaterally.  Ankles wrapped.  EKG: Normal sinus rhythm without abnormality.  Lab Results  Component Value Date   WBC 6.8 09/30/2019   HGB 8.3 (L) 09/30/2019   HCT 26.1 (L) 09/30/2019   MCV 86 09/30/2019   PLT 333 09/30/2019    Lab Results  Component Value Date   NA 140 09/30/2019   K 4.7 09/30/2019   CL 105 09/30/2019   CO2 23 09/30/2019   BUN 22 09/30/2019   CREATININE 1.34 (H) 09/30/2019   GLUCOSE 118 (  H) 09/30/2019   ALT 4 09/30/2019    Lab Results  Component Value Date   CHOL 164 09/30/2019   HDL 41 09/30/2019   LDLCALC 98 09/30/2019   TRIG 140 09/30/2019   CHOLHDL 4.0 09/30/2019    --------------------------------------------------------------------------------------------------  ASSESSMENT AND PLAN: Chronic HFrEF with normalization of LVEF: Ms. Yeaman continues to appear volume overloaded on examination today.  Some of that may be due to recent addition of amlodipine.  I have suggested that Ms. File decrease amlodipine to 2.5 mg daily.  She should continue with furosemide 40 mg daily and elevate her legs when possible.  She can continue to use her albuterol MDI as needed for shortness of breath likely due to underlying obstructive lung disease.  I encouraged Ms. Chasen to also reach out to Dr. Caryn Section to discuss other long-acting  inhalers that may be less cost prohibitive than Breo.  Coronary artery disease: Mild on catheterization in 2018.  Given worsening anemia, I have recommended discontinuation of aspirin pending further work-up.  Given LDL less than 100 off statin therapy, we will defer adding a statin at this time.  Aortic stenosis: Very mild on most recent echo.  Continue clinical follow-up.  Chronic anemia and history of multiple myeloma: Hemoglobin continues to trend down.  No active bleeding reported by Ms. Wilner or her husband.  However, it is possible that some of her worsening dyspnea could be due to anemia.  Third spacing from low hemoglobin may also be contributing to edema.  I have encouraged Ms. Glaspy to speak with her PCP.  Referral back to hematology may be worthwhile given history of multiple myeloma to ensure that this has not progressed.  Hypertension: Blood pressure well controlled today.  As above, will decrease amlodipine to 2.5 mg daily to see if this helps with some of her swelling.  We will continue current doses of lisinopril and metoprolol.  Follow-up: Return to clinic in 3 months.  Nelva Bush, MD 10/06/2019 10:32 AM

## 2019-10-08 ENCOUNTER — Encounter: Payer: Self-pay | Admitting: Family Medicine

## 2019-10-08 DIAGNOSIS — E113393 Type 2 diabetes mellitus with moderate nonproliferative diabetic retinopathy without macular edema, bilateral: Secondary | ICD-10-CM | POA: Diagnosis not present

## 2019-10-08 LAB — HM DIABETES EYE EXAM

## 2019-10-13 ENCOUNTER — Telehealth: Payer: Self-pay

## 2019-10-13 NOTE — Telephone Encounter (Signed)
-----  Message from Birdie Sons, MD sent at 10/06/2019  7:47 AM EDT ----- She has gotten much more anemic. Need referral back to hematology for worsening anemia and multiple myeloma. Was previously seen Dr. Mike Gip.  Rest of labs are normal. Continue current medications.  Need to schedule follow up for diabetes in 4-5 months.

## 2019-10-13 NOTE — Telephone Encounter (Signed)
Patient's husband advised. Follow up appointment scheduled. Patient's husband states he will discuss referral with patient and call back. He states he does not believe patient is interested in referral at this time.

## 2019-10-17 ENCOUNTER — Other Ambulatory Visit: Payer: Self-pay | Admitting: Family Medicine

## 2019-10-20 ENCOUNTER — Telehealth: Payer: Self-pay | Admitting: Family Medicine

## 2019-10-20 NOTE — Telephone Encounter (Signed)
Pt requesting order for nebulizer- routing to office

## 2019-10-20 NOTE — Telephone Encounter (Signed)
Medication Refill - Medication:  Nebulizer (Patient stated that the current prescription has expired and is requesting a callback once new prescription has been sent to pharmacy. )  Has the patient contacted their pharmacy?Yes (Agent: If no, request that the patient contact the pharmacy for the refill.) (Agent: If yes, when and what did the pharmacy advise?)Contact PCP  Preferred Pharmacy (with phone number or street name):  [id number] - [place], [place] - [address] Phone:  [phone]  Fax:  [phone]       Agent: Please be advised that RX refills may take up to 3 business days. We ask that you follow-up with your pharmacy.

## 2019-10-22 NOTE — Telephone Encounter (Signed)
What nebulizer is she talking about, we only have inhalers on her medication list.

## 2019-10-23 MED ORDER — LEVALBUTEROL HCL 1.25 MG/3ML IN NEBU: 1.2500 mg | INHALATION_SOLUTION | Freq: Three times a day (TID) | RESPIRATORY_TRACT | 3 refills | Status: DC | PRN

## 2019-10-23 NOTE — Telephone Encounter (Signed)
I called and spoke with patient's husband. He says she needs a refill on the levalbuterol (XOPENEX) 1.25 MG/3ML nebulizer solution Sig: Take 1.25 mg by nebulization every 8 (eight) hours as needed for wheezing.   This was prescribed several years ago (2017) per patients husband.I found this in the discontinued medications of patients chart.

## 2019-10-23 NOTE — Addendum Note (Signed)
Addended by: Lelon Huh E on: 10/23/2019 01:02 PM   Modules accepted: Orders

## 2019-11-14 ENCOUNTER — Other Ambulatory Visit: Payer: Self-pay | Admitting: Internal Medicine

## 2019-12-11 ENCOUNTER — Encounter: Payer: Medicare Other | Attending: Physician Assistant | Admitting: Physician Assistant

## 2019-12-11 ENCOUNTER — Encounter: Payer: Self-pay | Admitting: Emergency Medicine

## 2019-12-11 ENCOUNTER — Other Ambulatory Visit: Payer: Self-pay

## 2019-12-11 DIAGNOSIS — J45909 Unspecified asthma, uncomplicated: Secondary | ICD-10-CM | POA: Diagnosis present

## 2019-12-11 DIAGNOSIS — N179 Acute kidney failure, unspecified: Secondary | ICD-10-CM | POA: Diagnosis present

## 2019-12-11 DIAGNOSIS — F0390 Unspecified dementia without behavioral disturbance: Secondary | ICD-10-CM | POA: Insufficient documentation

## 2019-12-11 DIAGNOSIS — I872 Venous insufficiency (chronic) (peripheral): Secondary | ICD-10-CM | POA: Diagnosis present

## 2019-12-11 DIAGNOSIS — L03115 Cellulitis of right lower limb: Secondary | ICD-10-CM | POA: Insufficient documentation

## 2019-12-11 DIAGNOSIS — N184 Chronic kidney disease, stage 4 (severe): Secondary | ICD-10-CM | POA: Diagnosis not present

## 2019-12-11 DIAGNOSIS — I350 Nonrheumatic aortic (valve) stenosis: Secondary | ICD-10-CM | POA: Diagnosis not present

## 2019-12-11 DIAGNOSIS — D631 Anemia in chronic kidney disease: Secondary | ICD-10-CM | POA: Diagnosis not present

## 2019-12-11 DIAGNOSIS — L97218 Non-pressure chronic ulcer of right calf with other specified severity: Secondary | ICD-10-CM | POA: Diagnosis not present

## 2019-12-11 DIAGNOSIS — I2510 Atherosclerotic heart disease of native coronary artery without angina pectoris: Secondary | ICD-10-CM | POA: Diagnosis not present

## 2019-12-11 DIAGNOSIS — I428 Other cardiomyopathies: Secondary | ICD-10-CM | POA: Insufficient documentation

## 2019-12-11 DIAGNOSIS — I890 Lymphedema, not elsewhere classified: Secondary | ICD-10-CM | POA: Diagnosis not present

## 2019-12-11 DIAGNOSIS — I130 Hypertensive heart and chronic kidney disease with heart failure and stage 1 through stage 4 chronic kidney disease, or unspecified chronic kidney disease: Secondary | ICD-10-CM | POA: Diagnosis present

## 2019-12-11 DIAGNOSIS — Z6834 Body mass index (BMI) 34.0-34.9, adult: Secondary | ICD-10-CM

## 2019-12-11 DIAGNOSIS — E119 Type 2 diabetes mellitus without complications: Secondary | ICD-10-CM | POA: Diagnosis not present

## 2019-12-11 DIAGNOSIS — L97228 Non-pressure chronic ulcer of left calf with other specified severity: Secondary | ICD-10-CM | POA: Diagnosis not present

## 2019-12-11 DIAGNOSIS — E11621 Type 2 diabetes mellitus with foot ulcer: Secondary | ICD-10-CM | POA: Diagnosis not present

## 2019-12-11 DIAGNOSIS — E1122 Type 2 diabetes mellitus with diabetic chronic kidney disease: Secondary | ICD-10-CM | POA: Diagnosis not present

## 2019-12-11 DIAGNOSIS — L03116 Cellulitis of left lower limb: Secondary | ICD-10-CM | POA: Insufficient documentation

## 2019-12-11 DIAGNOSIS — I272 Pulmonary hypertension, unspecified: Secondary | ICD-10-CM | POA: Insufficient documentation

## 2019-12-11 DIAGNOSIS — I110 Hypertensive heart disease with heart failure: Secondary | ICD-10-CM | POA: Diagnosis not present

## 2019-12-11 DIAGNOSIS — E669 Obesity, unspecified: Secondary | ICD-10-CM | POA: Diagnosis present

## 2019-12-11 DIAGNOSIS — I509 Heart failure, unspecified: Secondary | ICD-10-CM | POA: Insufficient documentation

## 2019-12-11 DIAGNOSIS — I5042 Chronic combined systolic (congestive) and diastolic (congestive) heart failure: Secondary | ICD-10-CM | POA: Diagnosis present

## 2019-12-11 DIAGNOSIS — L97921 Non-pressure chronic ulcer of unspecified part of left lower leg limited to breakdown of skin: Secondary | ICD-10-CM | POA: Diagnosis not present

## 2019-12-11 DIAGNOSIS — Z20822 Contact with and (suspected) exposure to covid-19: Secondary | ICD-10-CM | POA: Diagnosis present

## 2019-12-11 LAB — CBC WITH DIFFERENTIAL/PLATELET
Abs Immature Granulocytes: 0.03 10*3/uL (ref 0.00–0.07)
Basophils Absolute: 0.1 10*3/uL (ref 0.0–0.1)
Basophils Relative: 1 %
Eosinophils Absolute: 0.2 10*3/uL (ref 0.0–0.5)
Eosinophils Relative: 3 %
HCT: 28.4 % — ABNORMAL LOW (ref 36.0–46.0)
Hemoglobin: 8.9 g/dL — ABNORMAL LOW (ref 12.0–15.0)
Immature Granulocytes: 0 %
Lymphocytes Relative: 24 %
Lymphs Abs: 1.7 10*3/uL (ref 0.7–4.0)
MCH: 28.3 pg (ref 26.0–34.0)
MCHC: 31.3 g/dL (ref 30.0–36.0)
MCV: 90.4 fL (ref 80.0–100.0)
Monocytes Absolute: 0.6 10*3/uL (ref 0.1–1.0)
Monocytes Relative: 9 %
Neutro Abs: 4.5 10*3/uL (ref 1.7–7.7)
Neutrophils Relative %: 63 %
Platelets: 310 10*3/uL (ref 150–400)
RBC: 3.14 MIL/uL — ABNORMAL LOW (ref 3.87–5.11)
RDW: 15.4 % (ref 11.5–15.5)
WBC: 7.1 10*3/uL (ref 4.0–10.5)
nRBC: 0 % (ref 0.0–0.2)

## 2019-12-11 LAB — COMPREHENSIVE METABOLIC PANEL
ALT: 7 U/L (ref 0–44)
AST: 11 U/L — ABNORMAL LOW (ref 15–41)
Albumin: 2.8 g/dL — ABNORMAL LOW (ref 3.5–5.0)
Alkaline Phosphatase: 79 U/L (ref 38–126)
Anion gap: 6 (ref 5–15)
BUN: 70 mg/dL — ABNORMAL HIGH (ref 8–23)
CO2: 23 mmol/L (ref 22–32)
Calcium: 9.6 mg/dL (ref 8.9–10.3)
Chloride: 104 mmol/L (ref 98–111)
Creatinine, Ser: 2.25 mg/dL — ABNORMAL HIGH (ref 0.44–1.00)
GFR, Estimated: 22 mL/min — ABNORMAL LOW (ref >60)
Glucose, Bld: 167 mg/dL — ABNORMAL HIGH (ref 70–99)
Potassium: 5.4 mmol/L — ABNORMAL HIGH (ref 3.5–5.1)
Sodium: 133 mmol/L — ABNORMAL LOW (ref 135–145)
Total Bilirubin: 0.4 mg/dL (ref 0.3–1.2)
Total Protein: 9.5 g/dL — ABNORMAL HIGH (ref 6.5–8.1)

## 2019-12-11 NOTE — Progress Notes (Signed)
HOPE, BRANDENBURGER ([id number]) Visit Report for 12/11/2019 Abuse/Suicide Risk Screen Details Patient Name: [name], [name]. Date of Service: 12/11/2019 12:45 PM Medical Record Number: [id number] Patient Account Number: [id number] Date of Birth/Sex: May 10, 1945 (74 y.o. F) Treating RN: Dolan Amen Primary Care Marietta Sikkema: Lelon Huh Other Clinician: Referring Jaymi Tinner: Lelon Huh Treating Alyse Kathan/Extender: Skipper Cliche in Treatment: 0 Abuse/Suicide Risk Screen Items Answer ABUSE RISK SCREEN: Has anyone close to you tried to hurt or harm you recentlyo No Do you feel uncomfortable with anyone in your familyo No Has anyone forced you do things that you didnot want to doo No Electronic Signature(s) Signed: 12/11/2019 4:55:46 PM By: Georges Mouse, Minus Breeding Entered By: Georges Mouse, Minus Breeding on 12/11/2019 12:57:08 [name] ([id number]) -------------------------------------------------------------------------------- Activities of Daily Living Details Patient Name: [name], Jillian B. Date of Service: 12/11/2019 12:45 PM Medical Record Number: [id number] Patient Account Number: [id number] Date of Birth/Sex: 1945/02/18 (74 y.o. F) Treating RN: Dolan Amen Primary Care Eliyanah Elgersma: Lelon Huh Other Clinician: Referring Pauletta Pickney: Lelon Huh Treating Keevin Panebianco/Extender: Skipper Cliche in Treatment: 0 Activities of Daily Living Items Answer Activities of Daily Living (Please select one for each item) Drive Automobile Not Able Take Medications Need Assistance Use Telephone Need Assistance Care for Appearance Need Assistance Use Toilet Need Assistance Bath / Shower Need Assistance Dress Self Need Assistance Feed Self Need Assistance Walk Need Assistance Get In / Out Bed Need Assistance Housework Need Assistance Prepare Meals Need Assistance Handle Money Need Assistance Shop for Self Need Assistance Electronic Signature(s) Signed: 12/11/2019 4:55:46 PM By: Charlett Nose Entered By: Georges Mouse, Minus Breeding on 12/11/2019 12:57:45 [name] ([id number]) -------------------------------------------------------------------------------- Education Screening Details Patient Name: Jillian Albe B. Date of Service: 12/11/2019 12:45 PM Medical Record Number: [id number] Patient Account Number: [id number] Date of Birth/Sex: 05/14/1945 (74 y.o. F) Treating RN: Dolan Amen Primary Care Avon Mergenthaler: Lelon Huh Other Clinician: Referring Mete Purdum: Lelon Huh Treating Dirck Butch/Extender: Skipper Cliche in Treatment: 0 Primary Learner Assessed: Patient Learning Preferences/Education Level/Primary Language Learning Preference: Explanation, Demonstration Highest Education Level: High School Preferred Language: English Cognitive Barrier Language Barrier: No Translator Needed: No Memory Deficit: No Emotional Barrier: No Cultural/Religious Beliefs Affecting Medical Care: No Physical Barrier Impaired Vision: No Impaired Hearing: No Decreased Hand dexterity: No Knowledge/Comprehension Knowledge Level: Medium Comprehension Level: Medium Ability to understand written instructions: Medium Ability to understand verbal instructions: Medium Motivation Anxiety Level: Calm Cooperation: Cooperative Education Importance: Acknowledges Need Interest in Health Problems: Asks Questions Perception: Coherent Willingness to Engage in Self-Management Medium Activities: Readiness to Engage in Self-Management Medium Activities: Electronic Signature(s) Signed: 12/11/2019 4:55:46 PM By: Georges Mouse, Minus Breeding Entered By: Georges Mouse, Minus Breeding on 12/11/2019 12:59:39 [name] ([id number]) -------------------------------------------------------------------------------- Fall Risk Assessment Details Patient Name: [name] Date of Service: 12/11/2019 12:45 PM Medical Record Number: [id number] Patient Account Number: [id number] Date of Birth/Sex:  10-10-1945 (74 y.o. F) Treating RN: Dolan Amen Primary Care Monque Haggar: Lelon Huh Other Clinician: Referring Ladarious Kresse: Lelon Huh Treating Fran Mcree/Extender: Skipper Cliche in Treatment: 0 Fall Risk Assessment Items Have you had 2 or more falls in the last 12 monthso 0 Yes Have you had any fall that resulted in injury in the last 12 monthso 0 No FALLS RISK SCREEN History of falling - immediate or within 3 months 25 Yes Secondary diagnosis (Do you have 2 or more medical diagnoseso) 15 Yes Ambulatory aid None/bed rest/wheelchair/nurse 0 Yes Crutches/cane/walker 0 No Furniture 0 No Intravenous therapy Access/Saline/Heparin Lock 0 No Gait/Transferring Normal/ bed rest/ wheelchair 0 Yes Weak (  short steps with or without shuffle, stooped but able to lift head while walking, may 10 Yes seek support from furniture) Impaired (short steps with shuffle, may have difficulty arising from chair, head down, impaired 0 No balance) Mental Status Oriented to own ability 0 No Electronic Signature(s) Signed: 12/11/2019 4:55:46 PM By: Georges Mouse, Minus Breeding Entered By: Georges Mouse, Minus Breeding on 12/11/2019 13:00:59 [name] ([id number]) -------------------------------------------------------------------------------- Foot Assessment Details Patient Name: Jillian Albe B. Date of Service: 12/11/2019 12:45 PM Medical Record Number: [id number] Patient Account Number: [id number] Date of Birth/Sex: [birth year] (74 y.o. F) Treating RN: Dolan Amen Primary Care Tahj Njoku: Lelon Huh Other Clinician: Referring Shalea Tomczak: Lelon Huh Treating Lenni Reckner/Extender: Skipper Cliche in Treatment: 0 Foot Assessment Items [x]  Unable to perform due to altered mental status Site Locations + = Sensation present, - = Sensation absent, C = Callus, U = Ulcer R = Redness, W = Warmth, M = Maceration, PU = Pre-ulcerative lesion F = Fissure, S = Swelling, D = Dryness Assessment Right:  Left: Other Deformity: No No Prior Foot Ulcer: No No Prior Amputation: No No Charcot Joint: No No Ambulatory Status: Ambulatory With Help Assistance Device: Wheelchair Gait: Electronic Signature(s) Signed: 12/11/2019 4:55:46 PM By: Georges Mouse, Minus Breeding Entered By: Georges Mouse, Minus Breeding on 12/11/2019 13:11:48 [name] ([id number]) -------------------------------------------------------------------------------- Nutrition Risk Screening Details Patient Name: Jillian Albe B. Date of Service: 12/11/2019 12:45 PM Medical Record Number: [id number] Patient Account Number: [id number] Date of Birth/Sex: [birth year] (74 y.o. F) Treating RN: Dolan Amen Primary Care Shironda Kain: Lelon Huh Other Clinician: Referring Waverley Krempasky: Lelon Huh Treating Raahil Ong/Extender: Skipper Cliche in Treatment: 0 Height (in): 64 Weight (lbs): 206 Body Mass Index (BMI): 35.4 Nutrition Risk Screening Items Score Screening NUTRITION RISK SCREEN: I have an illness or condition that made me change the kind and/or amount of food I eat 0 No I eat fewer than two meals per day 0 No I eat few fruits and vegetables, or milk products 0 No I have three or more drinks of beer, liquor or wine almost every day 0 No I have tooth or mouth problems that make it hard for me to eat 0 No I don't always have enough money to buy the food I need 0 No I eat alone most of the time 0 No I take three or more different prescribed or over-the-counter drugs a day 0 No Without wanting to, I have lost or gained 10 pounds in the last six months 0 No I am not always physically able to shop, cook and/or feed myself 0 No Nutrition Protocols Good Risk Protocol 0 No interventions needed Moderate Risk Protocol High Risk Proctocol Risk Level: Good Risk Score: 0 Electronic Signature(s) Signed: 12/11/2019 4:55:46 PM By: Georges Mouse, Minus Breeding Entered By: Georges Mouse, Minus Breeding on 12/11/2019 13:01:10

## 2019-12-11 NOTE — ED Triage Notes (Signed)
Pt in via POV; [facility] due to cellulitis to BLE.  States, "They said we need IV antibiotics."  New dressing applied per clinic.  Vitals WDL.

## 2019-12-11 NOTE — Progress Notes (Signed)
ASIANA, BENNINGER ([id number]) Visit Report for 12/11/2019 Allergy List Details Patient Name: [name], [name]. Date of Service: 12/11/2019 12:45 PM Medical Record Number: [id number] Patient Account Number: [id number] Date of Birth/Sex: 10-09-1945 (74 y.o. F) Treating RN: Dolan Amen Primary Care Malanie Koloski: Lelon Huh Other Clinician: Referring Georgie Eduardo: Lelon Huh Treating Kimiyo Carmicheal/Extender: Jeri Cos Weeks in Treatment: 0 Allergies Active Allergies penicillin hydrochlorothiazide Allergy Notes Electronic Signature(s) Signed: 12/11/2019 4:55:46 PM By: Georges Mouse, Minus Breeding Entered By: Georges Mouse, Minus Breeding on 12/11/2019 12:56:33 [name] ([id number]) -------------------------------------------------------------------------------- [facility] Details Patient Name: [name] Date of Service: 12/11/2019 12:45 PM Medical Record Number: [id number] Patient Account Number: [id number] Date of Birth/Sex: 1945/10/07 (74 y.o. F) Treating RN: Dolan Amen Primary Care Tahj Njoku: Lelon Huh Other Clinician: Referring Marleta Lapierre: Lelon Huh Treating Keeton Kassebaum/Extender: Skipper Cliche in Treatment: 0 Visit Information Patient Arrived: Wheel Chair Arrival Time: 12:53 Accompanied By: husband Transfer Assistance: EasyPivot Patient Lift Patient Identification Verified: Yes Secondary Verification Process Completed: Yes History Since Last Visit Electronic Signature(s) Signed: 12/11/2019 4:55:46 PM By: Georges Mouse, Minus Breeding Entered By: Georges Mouse, Minus Breeding on 12/11/2019 13:15:17 [name] ([id number]) -------------------------------------------------------------------------------- Clinic Level of Care Assessment Details Patient Name: [name]. Date of Service: 12/11/2019 12:45 PM Medical Record Number: [id number] Patient Account Number: [id number] Date of Birth/Sex: October 11, 1945 (74 y.o. F) Treating RN: Cornell Barman Primary Care Angi Goodell: Lelon Huh Other Clinician: Referring Jaquae Rieves: Lelon Huh Treating Daielle Melcher/Extender: Skipper Cliche in Treatment: 0 Clinic Level of Care Assessment Items TOOL 2 Quantity Score []  - Use when only an EandM is performed on the INITIAL visit 0 ASSESSMENTS - Nursing Assessment / Reassessment X - General Physical Exam (combine w/ comprehensive assessment (listed just below) when performed on new 1 20 pt. evals) X- 1 25 Comprehensive Assessment (HX, ROS, Risk Assessments, Wounds Hx, etc.) ASSESSMENTS - Wound and Skin Assessment / Reassessment []  - Simple Wound Assessment / Reassessment - one wound 0 []  - 0 Complex Wound Assessment / Reassessment - multiple wounds []  - 0 Dermatologic / Skin Assessment (not related to wound area) ASSESSMENTS - Ostomy and/or Continence Assessment and Care []  - Incontinence Assessment and Management 0 []  - 0 Ostomy Care Assessment and Management (repouching, etc.) PROCESS - Coordination of Care X - Simple Patient / Family Education for ongoing care 1 15 []  - 0 Complex (extensive) Patient / Family Education for ongoing care X- 1 10 Staff obtains Programmer, systems, Records, Test Results / Process Orders []  - 0 Staff telephones HHA, Nursing Homes / Clarify orders / etc []  - 0 Routine Transfer to another Facility (non-emergent condition) X- 1 10 Routine Hospital Admission (non-emergent condition) X- 1 15 New Admissions / Biomedical engineer / Ordering NPWT, Apligraf, etc. []  - 0 Emergency Hospital Admission (emergent condition) X- 1 10 Simple Discharge Coordination []  - 0 Complex (extensive) Discharge Coordination PROCESS - Special Needs []  - Pediatric / Minor Patient Management 0 []  - 0 Isolation Patient Management []  - 0 Hearing / Language / Visual special needs []  - 0 Assessment of Community assistance (transportation, D/C planning, etc.) []  - 0 Additional assistance / Altered mentation []  - 0 Support Surface(s) Assessment (bed, cushion,  seat, etc.) INTERVENTIONS - Wound Cleansing / Measurement X - Wound Imaging (photographs - any number of wounds) 1 5 []  - 0 Wound Tracing (instead of photographs) []  - 0 Simple Wound Measurement - one wound []  - 0 Complex Wound Measurement - multiple wounds [name], Jillian B. ([id number]) []  - 0 Simple Wound Cleansing - one  wound []  - 0 Complex Wound Cleansing - multiple wounds INTERVENTIONS - Wound Dressings []  - Small Wound Dressing one or multiple wounds 0 []  - 0 Medium Wound Dressing one or multiple wounds X- 1 20 Large Wound Dressing one or multiple wounds []  - 0 Application of Medications - injection INTERVENTIONS - Miscellaneous []  - External ear exam 0 []  - 0 Specimen Collection (cultures, biopsies, blood, body fluids, etc.) []  - 0 Specimen(s) / Culture(s) sent or taken to Lab for analysis []  - 0 Patient Transfer (multiple staff / Civil Service fast streamer / Similar devices) []  - 0 Simple Staple / Suture removal (25 or less) []  - 0 Complex Staple / Suture removal (26 or more) []  - 0 Hypo / Hyperglycemic Management (close monitor of Blood Glucose) []  - 0 Ankle / Brachial Index (ABI) - do not check if billed separately Has the patient been seen at the hospital within the last three years: Yes Total Score: 130 Level Of Care: New/Established - Level 4 Notes Patient sent to ED for admission and treatment of cellulitis. Electronic Signature(s) Unsigned Entered By: Gretta Cool, BSN, RN, CWS, Kim on 12/11/2019 17:15:55 Signature(s): Date(s): [name] ([id number]) -------------------------------------------------------------------------------- General Visit Notes Details Patient Name: [name], Jillian B. Date of Service: 12/11/2019 12:45 PM Medical Record Number: [id number] Patient Account Number: [id number] Date of Birth/Sex: 1945-08-07 (74 y.o. F) Treating RN: Cornell Barman Primary Care Hevin Jeffcoat: Lelon Huh Other Clinician: Referring Jeryn Bertoni: Lelon Huh Treating  Abigaelle Verley/Extender: Skipper Cliche in Treatment: 0 Notes Patient came in today with wet weepy wounds, reddened and hot legs, complaining of pain when gently touched. PA advised patient to to to the ER from evaluation and treatment of cellulitis. Patient's caregiver understands and agrees with plan. Electronic Signature(s) Signed: 12/11/2019 5:17:41 PM By: Gretta Cool, BSN, RN, CWS, Kim RN, BSN Entered By: Gretta Cool, BSN, RN, CWS, Kim on 12/11/2019 17:17:41 [name] ([id number]) -------------------------------------------------------------------------------- Multi Wound Chart Details Patient Name: [name], BIGOS. Date of Service: 12/11/2019 12:45 PM Medical Record Number: [id number] Patient Account Number: [id number] Date of Birth/Sex: 05/01/1945 (74 y.o. F) Treating RN: Dolan Amen Primary Care Tziporah Knoke: Lelon Huh Other Clinician: Referring Liseth Wann: Lelon Huh Treating Makensie Mulhall/Extender: Skipper Cliche in Treatment: 0 Vital Signs Height(in): Pulse(bpm): 70 Weight(lbs): Blood Pressure(mmHg): 167/86 Body Mass Index(BMI): Temperature(F): 97.7 Respiratory Rate(breaths/min): 20 Wound Assessments Treatment Notes Electronic Signature(s) Signed: 12/11/2019 4:55:46 PM By: Georges Mouse, Minus Breeding Entered By: Georges Mouse, Minus Breeding on 12/11/2019 13:33:52 [name] ([id number]) -------------------------------------------------------------------------------- [place] Details Patient Name: [name], Jillian B. Date of Service: 12/11/2019 12:45 PM Medical Record Number: [id number] Patient Account Number: [id number] Date of Birth/Sex: 04-Aug-1945 (74 y.o. F) Treating RN: Dolan Amen Primary Care Jess Toney: Lelon Huh Other Clinician: Referring Analys Ryden: Lelon Huh Treating Jourden Delmont/Extender: Skipper Cliche in Treatment: 0 Active Inactive Pain, Acute or Chronic Nursing Diagnoses: Pain, acute or chronic: actual or potential Goals: Patient will  verbalize adequate pain control and receive pain control interventions during procedures as needed Date Initiated: 12/11/2019 Target Resolution Date: 01/11/2020 Goal Status: Active Patient/caregiver will verbalize comfort level met Date Initiated: 12/11/2019 Target Resolution Date: 01/11/2020 Goal Status: Active Interventions: Assess comfort goal upon admission Complete pain assessment as per visit requirements Notes: Wound/Skin Impairment Nursing Diagnoses: Impaired tissue integrity Goals: Patient/caregiver will verbalize understanding of skin care regimen Date Initiated: 12/11/2019 Target Resolution Date: 01/11/2020 Goal Status: Active Ulcer/skin breakdown will have a volume reduction of 30% by week 4 Date Initiated: 12/11/2019 Target Resolution Date: 01/11/2020 Goal Status: Active Interventions: Assess patient/caregiver ability to  obtain necessary supplies Assess patient/caregiver ability to perform ulcer/skin care regimen upon admission and as needed Provide education on ulcer and skin care Treatment Activities: Skin care regimen initiated : 12/11/2019 Notes: Electronic Signature(s) Signed: 12/11/2019 4:55:46 PM By: Georges Mouse, Minus Breeding Entered By: Georges Mouse, Minus Breeding on 12/11/2019 13:33:32 [name] ([id number]) -------------------------------------------------------------------------------- Non-Wound Condition Assessment Details Patient Name: Jillian Albe B. Date of Service: 12/11/2019 12:45 PM Medical Record Number: [id number] Patient Account Number: [id number] Date of Birth/Sex: [birth year] (74 y.o. F) Treating RN: Dolan Amen Primary Care Kaeya Schiffer: Lelon Huh Other Clinician: Referring Amiliana Foutz: Lelon Huh Treating Dwayna Kentner/Extender: Skipper Cliche in Treatment: 0 Non-Wound Condition: Condition: Cellulitis Location: Leg Side: Bilateral Photos Notes Patient sent to ED for further evaluation and treatment Electronic Signature(s) Signed: 12/11/2019  4:55:46 PM By: Georges Mouse, Minus Breeding Entered By: Georges Mouse, Minus Breeding on 12/11/2019 13:25:51 [name] ([id number]) -------------------------------------------------------------------------------- Pain Assessment Details Patient Name: [name], Jillian B. Date of Service: 12/11/2019 12:45 PM Medical Record Number: [id number] Patient Account Number: [id number] Date of Birth/Sex: 1945-05-27 (74 y.o. F) Treating RN: Dolan Amen Primary Care Leshia Kope: Lelon Huh Other Clinician: Referring Kayelynn Abdou: Lelon Huh Treating Rainier Feuerborn/Extender: Skipper Cliche in Treatment: 0 Active Problems Location of Pain Severity and Description of Pain Patient Has Paino Yes Site Locations Pain Location: Pain in Ulcers Rate the pain. Current Pain Level: 8 Pain Management and Medication Current Pain Management: Electronic Signature(s) Signed: 12/11/2019 4:55:46 PM By: Georges Mouse, Minus Breeding Entered By: Georges Mouse, Minus Breeding on 12/11/2019 13:15:08 [name] ([id number]) -------------------------------------------------------------------------------- Vitals Details Patient Name: Jillian Albe B. Date of Service: 12/11/2019 12:45 PM Medical Record Number: [id number] Patient Account Number: [id number] Date of Birth/Sex: August 07, 1945 (74 y.o. F) Treating RN: Dolan Amen Primary Care Meghan Tiemann: Lelon Huh Other Clinician: Referring Boomer Winders: Lelon Huh Treating Kyri Dai/Extender: Skipper Cliche in Treatment: 0 Vital Signs Time Taken: 13:15 Temperature (F): 97.7 Pulse (bpm): 70 Respiratory Rate (breaths/min): 20 Blood Pressure (mmHg): 167/86 Reference Range: 80 - 120 mg / dl Electronic Signature(s) Signed: 12/11/2019 4:55:46 PM By: Georges Mouse, Minus Breeding Entered By: Georges Mouse, Minus Breeding on 12/11/2019 13:15:52

## 2019-12-11 NOTE — Progress Notes (Addendum)
[name], [name] ([id number]) Visit Report for 12/11/2019 Chief Complaint Document Details Patient Name: [name], [name]. Date of Service: 12/11/2019 12:45 PM Medical Record Number: [id number] Patient Account Number: [id number] Date of Birth/Sex: 1945-09-30 (74 y.o. F) Treating RN: Cornell Barman Primary Care Provider: Lelon Huh Other Clinician: Referring Provider: Lelon Huh Treating Provider/Extender: Skipper Cliche in Treatment: 0 Information Obtained from: Patient Chief Complaint Patient seen for complaints of Non-Healing Wounds to both lower extremities Electronic Signature(s) Signed: 12/11/2019 2:10:32 PM By: Worthy Keeler PA-C Entered By: Worthy Keeler on 12/11/2019 14:10:31 [name] ([id number]) -------------------------------------------------------------------------------- HPI Details Patient Name: [name] Date of Service: 12/11/2019 12:45 PM Medical Record Number: [id number] Patient Account Number: [id number] Date of Birth/Sex: 1945-04-26 (74 y.o. F) Treating RN: Cornell Barman Primary Care Provider: Lelon Huh Other Clinician: Referring Provider: Lelon Huh Treating Provider/Extender: Skipper Cliche in Treatment: 0 History of Present Illness Location: both lower extremity swelling with ulceration HPI Description: 74 year old patient who sees her PCP Dr. Lelon Huh was recently evaluated 10 days ago for diabetes mellitus, hypertension, CHF and hyperlipidemia. she also was noted to have ulcerations develop in her legs and she has been applying Silvadene dressings locally. In the past she has refused wound care referrals.her cardiologist Dr. Saunders Revel saw her and put her on 40 mg of furosemide daily. last hemoglobin A1c was 7.7%. she was also placed on ciprofloxacin twice daily for 7 days and a urine culture was recommended. past medical history significant for coronary artery disease, diabetes mellitus, nonischemic cardiomyopathy and pulmonary  hypertension. She is also status post heart catheterization and coronary angiography, tubal ligation and breast cyst removal in the past. She has never been a smoker. Patient had arterial studies done which showed bilateral ABIs are artificially elevated due to noncompressible and calcified vessels. Triphasic waveform throughout. Right great toe TBI is elevated while the left is normal. 10/23/2016 -- the patient is rather moribund from several issues including chronic back pain and knee pain and swelling of her legs. The large necrotic area on her left lateral anterior calf was bleeding on touch after washing her leg. There was a spot which needed silver nitrate cauterization and this was done appropriately. 11/06/2016 -- she is again noted to have friable bleeding from the left lower extremity wounds and this again had to be cauterized with silver nitrate to control the bleeding as pressure itself would not do it. 11/20/2016 -- the right lower extremity is completely healed and we have ordered 30-40 mm compression stocking's in both the dural layer and also a pair of juxta lites. 12/18/2016 -- she has not been wearing her compression stockings on her right leg and these have opened out into ulcerations again. Her left lower extremity has circumferential ulcerations now. I believe at this stage I would like to get her venous reflux studies done to make certain that there is no fixable superficial venous reflux. 12/29/2016 -- she has made an excellent recovery having continued with her appropriate doses of diuretics and elevation and exercise. She has not yet received her right lower extremity juxta lites. Her venous duplex study is at the end of January 01/13/16 on evaluation today patient's wounds appeared to be overall doing much better much less hyper granular than previous week's evaluation. The Hydrofera Blue Dressing's to be doing very well. She does tell me that she is having a little bit more  discomfort in the posterior aspect of her leg where she has a wound at this  point. Fortunately there appears to be no infection. 01/19/17 on evaluation today patient appears to be doing very well in regard to her bilateral lower extremity swelling and at this point in time her left lower extremity ulcers. Her wounds appear to be doing much better. She has been tolerating the dressing's at this point fortunately she did get the Juxta-Lite compression as of today as well. Nonetheless I am pleased that she has been tolerating everything so well and that her wounds looks so good. In fact she is an excellent granular surface no evidence of slough covering and I do not see any reason for likely debridement today. Especially in regard to the posterior leg. 01/26/17 on evaluation today patient appears to be doing decently well in regard to her wounds. The surface of the majority of her wounds is greatly improved. She has been using the Juxta-Lite compression which is excellent. Nonetheless the wound of the left posterior calf did require some debridement today otherwise the majority of the wounds did not require any debridement. The this was due to slough buildup on the surface. She also has a small skin flap where two of the ulcers actually connected and this has loosened up I'm concerned that things may not heal well with that flap but for the time being I'm not gonna do anything different in that regard. 02/23/17 on evaluation today patient's wound actually appears to be doing fairly well in regard to the remaining left lateral ulcer which we have been treated. With that being said it does appear that her wrap actually slid down the wood bit over the past few days at least that there's actually some friction injury and blistering noted on the medial aspect of her malleolus of the left lower extremity. She continues to use the Juxta- Lite for the right lower extremity. With that being said no debridement is  necessary today and these are very superficial injuries as far as the new injuries are concerned. 03/02/17 on evaluation today patient appears to be doing excellent in regard to her left lateral lower from the ulcers. In fact it appears that she is almost completely healed in regard to the ulcer we have been following and the new injuries which were caused by the wraps slipping down last week have been completely resolved which is excellent news. Overall I'm definitely pleased with the progress that she has made. Fortunately the rat did not cause any irritation as it did previously with the Unna wrap. 03/09/17 on evaluation today patient's wound appears to be completely healed. Obviously this is great news. She and her husband both are extremely pleased and excited to finally have this gone. Obviously she has been dealing with this wound for quite some time. Readmission: 04/14/2019 upon evaluation today patient appears for reevaluation here in our clinic although it has been since March 2019 since I last saw her. She unfortunately is having issues similar to what she had previous which are blisters and ulcerations of the bilateral lower extremities. Fortunately there is no evidence of active infection at this time based on what I am seeing. Unfortunately she is still having a lot of drainage and weeping. I do believe she needs to be compression wrap in order to get this under control and she is previously tolerated a 3 layer compression wrap without any complications whatsoever. No fevers, chills, nausea, vomiting, or diarrhea. DAY, GREB ([id number]) 04/28/2019 upon evaluation today patient appears to be doing well with regard to the bilateral lower  extremities at this time. She has been tolerating the dressing changes without complication. Fortunately there is no signs of active infection at this time. No fevers, chills, nausea, vomiting, or diarrhea. 05/05/2019 upon evaluation today patient appears  to be doing excellent in regard to her legs. She has a lot of new skin growth and overall seems to be progressing quite nicely. Fortunately there is no signs of active infection at this time. No fevers, chills, nausea, vomiting, or diarrhea. 05/12/2019 upon evaluation today patient appears to be doing very well with regard to her lower extremities. She is not completely healed at this point but nonetheless he is doing much better even compared to last week. I am very pleased with where things stand. No fevers, chills, nausea, vomiting, or diarrhea. 05/26/2019 upon evaluation today patient actually appears to be doing worse with regard to her lower extremities. She has green drainage unfortunately is having more pain on the right lower extremity as well. Fortunately there is no signs of active infection systemically which is good news. 06/03/2019 upon evaluation today patient appears to be doing about the same in regard to the fact that she still has wounds on both lower extremities although all wounds are measuring better and appearing much better. She finally did start taking the medication which has made a big difference for her that is the Levaquin. Unfortunately this appears to be making her urinate much more frequently. She has been taking this in the evening therefore it is keeping her up at night. They are wondering if they can switch to taking this during the day. I would actually never instructed that they had to take this at night and I think it is definitely fine for her to take it in the morning. She will start that tomorrow. 06/10/2019 upon evaluation today patient appears to be doing excellent in regard to her lower extremities currently. The right lower extremity actually appears to be pretty much healed based on what I am seeing I do not see anything open or draining. The left lower extremity though not completely healed does appear to be doing much better which is great news. Overall there  is no signs of active infection at this time. 06/17/2019 upon evaluation today patient appears to be doing excellent in regard to her ulcers. She has been tolerating the dressing changes without complication in fact everything appears to be almost completely healed today. There is one area on the left lower extremity which appears to potentially still be weeping slightly and I think that we do need to see about wrapping her 1 more week just to make sure everything tightens up before discharge. 06/24/2019 upon evaluation today patient appears to be doing excellent in regard to her bilateral lower extremities. There does not appear to be any evidence of active infection which is great news and overall I am extremely pleased with the fact that again she appears to be ready for discharge. Readmission: 12/11/2019 patient is here for readmission today concerning her bilateral lower extremities. We have not seen her since June 2021. With that being said for the past 2 months apparently she has been having issues with her legs. It was noted by her husband that unfortunately he attempted to call to get an appointment with [place] but was not able to get anything in a sufficient amount of time to get her in sooner due to the fact that again we were having issues with staffing and scheduling. Therefore it was delayed getting him  into the wound care center for his wife. Nonetheless she appears to have evidence of cellulitis today of the bilateral lower extremities. Unfortunately she does not seem to be doing too well she is having a tremendous amount of pain and is asking for pain medication. Obviously it does look like her legs hurt although her wounds appear to be very superficial I am more concerned about the cellulitis aspect of this as opposed to the actual wound openings. Electronic Signature(s) Signed: 12/11/2019 3:19:55 PM By: Worthy Keeler PA-C Entered By: Worthy Keeler on 12/11/2019 15:19:55 [name] ([id number]) -------------------------------------------------------------------------------- Physical Exam Details Patient Name: [name], Jillian B. Date of Service: 12/11/2019 12:45 PM Medical Record Number: [id number] Patient Account Number: [id number] Date of Birth/Sex: 1945/06/24 (74 y.o. F) Treating RN: Cornell Barman Primary Care Provider: Lelon Huh Other Clinician: Referring Provider: Lelon Huh Treating Provider/Extender: Skipper Cliche in Treatment: 0 Constitutional patient is hypertensive.. pulse regular and within target range for patient.Marland Kitchen respirations regular, non-labored and within target range for patient.Marland Kitchen temperature within target range for patient.. Well-nourished and well-hydrated in no acute distress. Eyes conjunctiva clear no eyelid edema noted. pupils equal round and reactive to light and accommodation. Ears, Nose, Mouth, and Throat no gross abnormality of ear auricles or external auditory canals. normal hearing noted during conversation. mucus membranes moist. Respiratory normal breathing without difficulty. Cardiovascular Absent posterior tibial and dorsalis pedis pulses bilateral lower extremities. 2+ pitting edema of the bilateral lower extremities. Musculoskeletal normal gait and posture. no significant deformity or arthritic changes, no loss or range of motion, no clubbing. Psychiatric this patient is able to make decisions and demonstrates good insight into disease process. Alert and Oriented x 3. pleasant and cooperative. Notes Upon inspection patient's legs appear to show signs of cellulitis bilaterally from the feet up to the knees and the left is slightly worse than the right but both appear to be significant in nature. Fortunately there is no signs of systemic infection at this point which is good news. No fevers, chills, nausea, vomiting, or diarrhea. With that being said she does have dementia she is also having quite a bit of significant  discomfort at this point unfortunately. Electronic Signature(s) Signed: 12/11/2019 3:23:15 PM By: Worthy Keeler PA-C Entered By: Worthy Keeler on 12/11/2019 15:23:14 [name] ([id number]) -------------------------------------------------------------------------------- Physician Orders Details Patient Name: EFFIE, WAHLERT B. Date of Service: 12/11/2019 12:45 PM Medical Record Number: [id number] Patient Account Number: [id number] Date of Birth/Sex: May 13, 1945 (74 y.o. F) Treating RN: Cornell Barman Primary Care Provider: Lelon Huh Other Clinician: Referring Provider: Lelon Huh Treating Provider/Extender: Skipper Cliche in Treatment: 0 Verbal / Phone Orders: No Diagnosis Coding ICD-10 Coding Code Description I89.0 Lymphedema, not elsewhere classified I87.2 Venous insufficiency (chronic) (peripheral) L03.115 Cellulitis of right lower limb L03.116 Cellulitis of left lower limb Secondary Dressing o ABD and Kerlix/Conform Additional Orders / Instructions o Other: - Go to ED for evaluation and treatment of cellulitis. Electronic Signature(s) Signed: 12/11/2019 5:14:46 PM By: Gretta Cool, BSN, RN, CWS, Kim RN, BSN Signed: 12/12/2019 4:50:08 PM By: Worthy Keeler PA-C Entered By: Gretta Cool BSN, RN, CWS, Kim on 12/11/2019 17:14:46 [name] ([id number]) -------------------------------------------------------------------------------- Problem List Details Patient Name: NORLENE, LANES. Date of Service: 12/11/2019 12:45 PM Medical Record Number: [id number] Patient Account Number: [id number] Date of Birth/Sex: [birth year] (74 y.o. F) Treating RN: Cornell Barman Primary Care Provider: Lelon Huh Other Clinician: Referring Provider: Lelon Huh Treating Provider/Extender: Skipper Cliche in Treatment: 0 Active Problems ICD-10  Encounter Code Description Active Date MDM Diagnosis I89.0 Lymphedema, not elsewhere classified 12/11/2019 No Yes I87.2 Venous insufficiency  (chronic) (peripheral) 12/11/2019 No Yes L03.115 Cellulitis of right lower limb 12/11/2019 No Yes L03.116 Cellulitis of left lower limb 12/11/2019 No Yes Inactive Problems Resolved Problems Electronic Signature(s) Signed: 12/11/2019 1:58:09 PM By: Worthy Keeler PA-C Entered By: Worthy Keeler on 12/11/2019 13:58:09 [name], [name] ([id number]) -------------------------------------------------------------------------------- Progress Note Details Patient Name: Jillian Albe B. Date of Service: 12/11/2019 12:45 PM Medical Record Number: [id number] Patient Account Number: [id number] Date of Birth/Sex: [birth year] (73 y.o. F) Treating RN: Cornell Barman Primary Care Provider: Lelon Huh Other Clinician: Referring Provider: Lelon Huh Treating Provider/Extender: Skipper Cliche in Treatment: 0 Subjective Chief Complaint Information obtained from Patient Patient seen for complaints of Non-Healing Wounds to both lower extremities History of Present Illness (HPI) The following HPI elements were documented for the patient's wound: Location: both lower extremity swelling with ulceration 74 year old patient who sees her PCP Dr. Lelon Huh was recently evaluated 10 days ago for diabetes mellitus, hypertension, CHF and hyperlipidemia. she also was noted to have ulcerations develop in her legs and she has been applying Silvadene dressings locally. In the past she has refused wound care referrals.her cardiologist Dr. Saunders Revel saw her and put her on 40 mg of furosemide daily. last hemoglobin A1c was 7.7%. she was also placed on ciprofloxacin twice daily for 7 days and a urine culture was recommended. past medical history significant for coronary artery disease, diabetes mellitus, nonischemic cardiomyopathy and pulmonary hypertension. She is also status post heart catheterization and coronary angiography, tubal ligation and breast cyst removal in the past. She has never been a smoker. Patient had  arterial studies done which showed bilateral ABIs are artificially elevated due to noncompressible and calcified vessels. Triphasic waveform throughout. Right great toe TBI is elevated while the left is normal. 10/23/2016 -- the patient is rather moribund from several issues including chronic back pain and knee pain and swelling of her legs. The large necrotic area on her left lateral anterior calf was bleeding on touch after washing her leg. There was a spot which needed silver nitrate cauterization and this was done appropriately. 11/06/2016 -- she is again noted to have friable bleeding from the left lower extremity wounds and this again had to be cauterized with silver nitrate to control the bleeding as pressure itself would not do it. 11/20/2016 -- the right lower extremity is completely healed and we have ordered 30-40 mm compression stocking's in both the dural layer and also a pair of juxta lites. 12/18/2016 -- she has not been wearing her compression stockings on her right leg and these have opened out into ulcerations again. Her left lower extremity has circumferential ulcerations now. I believe at this stage I would like to get her venous reflux studies done to make certain that there is no fixable superficial venous reflux. 12/29/2016 -- she has made an excellent recovery having continued with her appropriate doses of diuretics and elevation and exercise. She has not yet received her right lower extremity juxta lites. Her venous duplex study is at the end of January 01/13/16 on evaluation today patient's wounds appeared to be overall doing much better much less hyper granular than previous week's evaluation. The Hydrofera Blue Dressing's to be doing very well. She does tell me that she is having a little bit more discomfort in the posterior aspect of her leg where she has a wound at this point. Fortunately there appears to  be no infection. 01/19/17 on evaluation today patient appears to be  doing very well in regard to her bilateral lower extremity swelling and at this point in time her left lower extremity ulcers. Her wounds appear to be doing much better. She has been tolerating the dressing's at this point fortunately she did get the Juxta-Lite compression as of today as well. Nonetheless I am pleased that she has been tolerating everything so well and that her wounds looks so good. In fact she is an excellent granular surface no evidence of slough covering and I do not see any reason for likely debridement today. Especially in regard to the posterior leg. 01/26/17 on evaluation today patient appears to be doing decently well in regard to her wounds. The surface of the majority of her wounds is greatly improved. She has been using the Juxta-Lite compression which is excellent. Nonetheless the wound of the left posterior calf did require some debridement today otherwise the majority of the wounds did not require any debridement. The this was due to slough buildup on the surface. She also has a small skin flap where two of the ulcers actually connected and this has loosened up I'm concerned that things may not heal well with that flap but for the time being I'm not gonna do anything different in that regard. 02/23/17 on evaluation today patient's wound actually appears to be doing fairly well in regard to the remaining left lateral ulcer which we have been treated. With that being said it does appear that her wrap actually slid down the wood bit over the past few days at least that there's actually some friction injury and blistering noted on the medial aspect of her malleolus of the left lower extremity. She continues to use the Juxta- Lite for the right lower extremity. With that being said no debridement is necessary today and these are very superficial injuries as far as the new injuries are concerned. 03/02/17 on evaluation today patient appears to be doing excellent in regard to her  left lateral lower from the ulcers. In fact it appears that she is almost completely healed in regard to the ulcer we have been following and the new injuries which were caused by the wraps slipping down last week have been completely resolved which is excellent news. Overall I'm definitely pleased with the progress that she has made. Fortunately the rat did not cause any irritation as it did previously with the Unna wrap. 03/09/17 on evaluation today patient's wound appears to be completely healed. Obviously this is great news. She and her husband both are extremely pleased and excited to finally have this gone. Obviously she has been dealing with this wound for quite some time. Readmission: CRISTEN, BREDESON ([id number]) 04/14/2019 upon evaluation today patient appears for reevaluation here in our clinic although it has been since March 2019 since I last saw her. She unfortunately is having issues similar to what she had previous which are blisters and ulcerations of the bilateral lower extremities. Fortunately there is no evidence of active infection at this time based on what I am seeing. Unfortunately she is still having a lot of drainage and weeping. I do believe she needs to be compression wrap in order to get this under control and she is previously tolerated a 3 layer compression wrap without any complications whatsoever. No fevers, chills, nausea, vomiting, or diarrhea. 04/28/2019 upon evaluation today patient appears to be doing well with regard to the bilateral lower extremities at this time.  She has been tolerating the dressing changes without complication. Fortunately there is no signs of active infection at this time. No fevers, chills, nausea, vomiting, or diarrhea. 05/05/2019 upon evaluation today patient appears to be doing excellent in regard to her legs. She has a lot of new skin growth and overall seems to be progressing quite nicely. Fortunately there is no signs of active infection at  this time. No fevers, chills, nausea, vomiting, or diarrhea. 05/12/2019 upon evaluation today patient appears to be doing very well with regard to her lower extremities. She is not completely healed at this point but nonetheless he is doing much better even compared to last week. I am very pleased with where things stand. No fevers, chills, nausea, vomiting, or diarrhea. 05/26/2019 upon evaluation today patient actually appears to be doing worse with regard to her lower extremities. She has green drainage unfortunately is having more pain on the right lower extremity as well. Fortunately there is no signs of active infection systemically which is good news. 06/03/2019 upon evaluation today patient appears to be doing about the same in regard to the fact that she still has wounds on both lower extremities although all wounds are measuring better and appearing much better. She finally did start taking the medication which has made a big difference for her that is the Levaquin. Unfortunately this appears to be making her urinate much more frequently. She has been taking this in the evening therefore it is keeping her up at night. They are wondering if they can switch to taking this during the day. I would actually never instructed that they had to take this at night and I think it is definitely fine for her to take it in the morning. She will start that tomorrow. 06/10/2019 upon evaluation today patient appears to be doing excellent in regard to her lower extremities currently. The right lower extremity actually appears to be pretty much healed based on what I am seeing I do not see anything open or draining. The left lower extremity though not completely healed does appear to be doing much better which is great news. Overall there is no signs of active infection at this time. 06/17/2019 upon evaluation today patient appears to be doing excellent in regard to her ulcers. She has been tolerating the dressing  changes without complication in fact everything appears to be almost completely healed today. There is one area on the left lower extremity which appears to potentially still be weeping slightly and I think that we do need to see about wrapping her 1 more week just to make sure everything tightens up before discharge. 06/24/2019 upon evaluation today patient appears to be doing excellent in regard to her bilateral lower extremities. There does not appear to be any evidence of active infection which is great news and overall I am extremely pleased with the fact that again she appears to be ready for discharge. Readmission: 12/11/2019 patient is here for readmission today concerning her bilateral lower extremities. We have not seen her since June 2021. With that being said for the past 2 months apparently she has been having issues with her legs. It was noted by her husband that unfortunately he attempted to call to get an appointment with [place] but was not able to get anything in a sufficient amount of time to get her in sooner due to the fact that again we were having issues with staffing and scheduling. Therefore it was delayed getting him into the wound care  center for his wife. Nonetheless she appears to have evidence of cellulitis today of the bilateral lower extremities. Unfortunately she does not seem to be doing too well she is having a tremendous amount of pain and is asking for pain medication. Obviously it does look like her legs hurt although her wounds appear to be very superficial I am more concerned about the cellulitis aspect of this as opposed to the actual wound openings. Patient History Information obtained from Patient. Allergies penicillin, hydrochlorothiazide Family History Cancer - Mother,Siblings, Diabetes - Mother,Siblings, Heart Disease - Mother, Hypertension - Siblings, No family history of Hereditary Spherocytosis, Kidney Disease, Lung Disease, Seizures, Stroke, Thyroid  Problems, Tuberculosis. Social History Never smoker, Marital Status - Married, Alcohol Use - Never, Drug Use - No History, Caffeine Use - Daily. Medical History Hematologic/Lymphatic Patient has history of Lymphedema Denies history of Anemia, Hemophilia, Human Immunodeficiency Virus, Sickle Cell Disease Respiratory Patient has history of Asthma Cardiovascular Patient has history of Congestive Heart Failure, Coronary Artery Disease, Hypertension Denies history of Angina, Arrhythmia, Deep Vein Thrombosis, Hypotension, Myocardial Infarction, Peripheral Arterial Disease, Peripheral Venous Disease, Phlebitis, Vasculitis Endocrine Patient has history of Type II Diabetes Denies history of Type I Diabetes Integumentary (Skin) Denies history of History of Burn, History of pressure wounds [name], Jillian B. ([id number]) Musculoskeletal Patient has history of Osteoarthritis Denies history of Gout, Rheumatoid Arthritis, Osteomyelitis Objective Constitutional patient is hypertensive.. pulse regular and within target range for patient.Marland Kitchen respirations regular, non-labored and within target range for patient.Marland Kitchen temperature within target range for patient.. Well-nourished and well-hydrated in no acute distress. Vitals Time Taken: 1:15 PM, Temperature: 97.7 F, Pulse: 70 bpm, Respiratory Rate: 20 breaths/min, Blood Pressure: 167/86 mmHg. Eyes conjunctiva clear no eyelid edema noted. pupils equal round and reactive to light and accommodation. Ears, Nose, Mouth, and Throat no gross abnormality of ear auricles or external auditory canals. normal hearing noted during conversation. mucus membranes moist. Respiratory normal breathing without difficulty. Cardiovascular Absent posterior tibial and dorsalis pedis pulses bilateral lower extremities. 2+ pitting edema of the bilateral lower extremities. Musculoskeletal normal gait and posture. no significant deformity or arthritic changes, no loss or range of  motion, no clubbing. Psychiatric this patient is able to make decisions and demonstrates good insight into disease process. Alert and Oriented x 3. pleasant and cooperative. General Notes: Upon inspection patient's legs appear to show signs of cellulitis bilaterally from the feet up to the knees and the left is slightly worse than the right but both appear to be significant in nature. Fortunately there is no signs of systemic infection at this point which is good news. No fevers, chills, nausea, vomiting, or diarrhea. With that being said she does have dementia she is also having quite a bit of significant discomfort at this point unfortunately. Other Condition(s) Patient presents with Cellulitis located on the Bilateral Leg. General Notes: Patient sent to ED for further evaluation and treatment Assessment Active Problems ICD-10 Lymphedema, not elsewhere classified Venous insufficiency (chronic) (peripheral) Cellulitis of right lower limb Cellulitis of left lower limb Plan 1. Would recommend currently that we go ahead and continue with the recommendation to have the patient go to the ER for further evaluation and treatment. I feel like that she may need IV antibiotic therapy based on what I am seeing. The cellulitis is extensive. I do not think she will need this for too long but I believe we need to at least get something started here. Following that IV course I think oral medication would likely  do [name], [name] ([id number]) well for her to be honest. But nonetheless I think the initial course of IV antibiotics is probably necessary to get things moving in the right direction. 2. We did put a dressing on her today just to get her over to the ER for evaluation this does not have any medicated dressings in place just simply something to catch any drainage until they take things off of the ER and have her further evaluated. We will see her back for reevaluation here in the clinic after  discharge from the emergency department whenever that may be. Electronic Signature(s) Signed: 12/11/2019 3:23:55 PM By: Worthy Keeler PA-C Entered By: Worthy Keeler on 12/11/2019 15:23:55 [name] ([id number]) -------------------------------------------------------------------------------- ROS/PFSH Details Patient Name: TIFANNY, DOLLENS B. Date of Service: 12/11/2019 12:45 PM Medical Record Number: [id number] Patient Account Number: [id number] Date of Birth/Sex: 1945-04-04 (74 y.o. F) Treating RN: Dolan Amen Primary Care Provider: Lelon Huh Other Clinician: Referring Provider: Lelon Huh Treating Provider/Extender: Skipper Cliche in Treatment: 0 Information Obtained From Patient Hematologic/Lymphatic Medical History: Positive for: Lymphedema Negative for: Anemia; Hemophilia; Human Immunodeficiency Virus; Sickle Cell Disease Respiratory Medical History: Positive for: Asthma Cardiovascular Medical History: Positive for: Congestive Heart Failure; Coronary Artery Disease; Hypertension Negative for: Angina; Arrhythmia; Deep Vein Thrombosis; Hypotension; Myocardial Infarction; Peripheral Arterial Disease; Peripheral Venous Disease; Phlebitis; Vasculitis Endocrine Medical History: Positive for: Type II Diabetes Negative for: Type I Diabetes Time with diabetes: a long time Treated with: Oral agents Blood sugar tested every day: No Integumentary (Skin) Medical History: Negative for: History of Burn; History of pressure wounds Musculoskeletal Medical History: Positive for: Osteoarthritis Negative for: Gout; Rheumatoid Arthritis; Osteomyelitis Immunizations Pneumococcal Vaccine: Received Pneumococcal Vaccination: Yes Implantable Devices None Family and Social History Cancer: Yes - Mother,Siblings; Diabetes: Yes - Mother,Siblings; Heart Disease: Yes - Mother; Hereditary Spherocytosis: No; Hypertension: Yes - Siblings; Kidney Disease: No; Lung Disease: No;  Seizures: No; Stroke: No; Thyroid Problems: No; Tuberculosis: No; Never smoker; Marital Status - Married; Alcohol Use: Never; Drug Use: No History; Caffeine Use: Daily; Financial Concerns: No; Food, Clothing or Shelter Needs: No; Support System Lacking: No; Transportation Concerns: No Electronic Signature(s) Signed: 12/11/2019 4:15:39 PM By: Curtis Sites, JERYL WILBOURN ([id number]) Signed: 12/11/2019 4:55:46 PM By: Georges Mouse, Minus Breeding Entered By: Georges Mouse, Minus Breeding on 12/11/2019 12:56:57 [name] ([id number]) -------------------------------------------------------------------------------- [place] Details Patient Name: Jillian Albe B. Date of Service: 12/11/2019 Medical Record Number: [id number] Patient Account Number: [id number] Date of Birth/Sex: Sep 03, 1945 (74 y.o. F) Treating RN: Cornell Barman Primary Care Provider: Lelon Huh Other Clinician: Referring Provider: Lelon Huh Treating Provider/Extender: Skipper Cliche in Treatment: 0 Diagnosis Coding ICD-10 Codes Code Description I89.0 Lymphedema, not elsewhere classified I87.2 Venous insufficiency (chronic) (peripheral) L03.115 Cellulitis of right lower limb L03.116 Cellulitis of left lower limb Facility Procedures CPT4 Code: [id number] Description: 70962 - WOUND CARE VISIT-LEV 4 EST PT Modifier: Quantity: 1 Physician Procedures CPT4 Code: 8366294 Description: 76546 - WC PHYS LEVEL 4 - EST PT Modifier: Quantity: 1 CPT4 Code: Description: ICD-10 Diagnosis Description I89.0 Lymphedema, not elsewhere classified I87.2 Venous insufficiency (chronic) (peripheral) L03.115 Cellulitis of right lower limb L03.116 Cellulitis of left lower limb Modifier: Quantity: Electronic Signature(s) Signed: 12/11/2019 5:16:07 PM By: Gretta Cool, BSN, RN, CWS, Kim RN, BSN Signed: 12/12/2019 4:50:08 PM By: Worthy Keeler PA-C Previous Signature: 12/11/2019 3:24:17 PM Version By: Worthy Keeler PA-C Entered By: Gretta Cool, BSN, RN,  CWS, Kim on 12/11/2019 17:16:06

## 2019-12-12 ENCOUNTER — Ambulatory Visit: Payer: Medicare Other | Admitting: Urology

## 2019-12-12 ENCOUNTER — Encounter: Payer: Self-pay | Admitting: Internal Medicine

## 2019-12-12 ENCOUNTER — Inpatient Hospital Stay
Admission: EM | Admit: 2019-12-12 | Discharge: 2019-12-15 | DRG: 603 | Disposition: A | Discharge status: Home Health | Payer: Medicare Other | Attending: Internal Medicine | Admitting: Internal Medicine

## 2019-12-12 DIAGNOSIS — I130 Hypertensive heart and chronic kidney disease with heart failure and stage 1 through stage 4 chronic kidney disease, or unspecified chronic kidney disease: Secondary | ICD-10-CM | POA: Diagnosis present

## 2019-12-12 DIAGNOSIS — I428 Other cardiomyopathies: Secondary | ICD-10-CM | POA: Diagnosis present

## 2019-12-12 DIAGNOSIS — I872 Venous insufficiency (chronic) (peripheral): Secondary | ICD-10-CM | POA: Diagnosis present

## 2019-12-12 DIAGNOSIS — E1122 Type 2 diabetes mellitus with diabetic chronic kidney disease: Secondary | ICD-10-CM | POA: Diagnosis present

## 2019-12-12 DIAGNOSIS — I350 Nonrheumatic aortic (valve) stenosis: Secondary | ICD-10-CM

## 2019-12-12 DIAGNOSIS — N184 Chronic kidney disease, stage 4 (severe): Secondary | ICD-10-CM | POA: Insufficient documentation

## 2019-12-12 DIAGNOSIS — I2510 Atherosclerotic heart disease of native coronary artery without angina pectoris: Secondary | ICD-10-CM | POA: Diagnosis present

## 2019-12-12 DIAGNOSIS — Z20822 Contact with and (suspected) exposure to covid-19: Secondary | ICD-10-CM | POA: Diagnosis present

## 2019-12-12 DIAGNOSIS — L03115 Cellulitis of right lower limb: Principal | ICD-10-CM

## 2019-12-12 DIAGNOSIS — D631 Anemia in chronic kidney disease: Secondary | ICD-10-CM | POA: Diagnosis present

## 2019-12-12 DIAGNOSIS — D649 Anemia, unspecified: Secondary | ICD-10-CM | POA: Diagnosis present

## 2019-12-12 DIAGNOSIS — N183 Chronic kidney disease, stage 3 unspecified: Secondary | ICD-10-CM | POA: Diagnosis present

## 2019-12-12 DIAGNOSIS — E669 Obesity, unspecified: Secondary | ICD-10-CM | POA: Diagnosis present

## 2019-12-12 DIAGNOSIS — F0390 Unspecified dementia without behavioral disturbance: Secondary | ICD-10-CM | POA: Diagnosis present

## 2019-12-12 DIAGNOSIS — I10 Essential (primary) hypertension: Secondary | ICD-10-CM | POA: Diagnosis present

## 2019-12-12 DIAGNOSIS — L03119 Cellulitis of unspecified part of limb: Secondary | ICD-10-CM

## 2019-12-12 DIAGNOSIS — N179 Acute kidney failure, unspecified: Secondary | ICD-10-CM

## 2019-12-12 DIAGNOSIS — L03116 Cellulitis of left lower limb: Secondary | ICD-10-CM | POA: Diagnosis not present

## 2019-12-12 DIAGNOSIS — E11622 Type 2 diabetes mellitus with other skin ulcer: Secondary | ICD-10-CM

## 2019-12-12 DIAGNOSIS — N1832 Chronic kidney disease, stage 3b: Secondary | ICD-10-CM | POA: Diagnosis present

## 2019-12-12 DIAGNOSIS — I5022 Chronic systolic (congestive) heart failure: Secondary | ICD-10-CM | POA: Diagnosis not present

## 2019-12-12 DIAGNOSIS — I272 Pulmonary hypertension, unspecified: Secondary | ICD-10-CM | POA: Diagnosis present

## 2019-12-12 DIAGNOSIS — J45909 Unspecified asthma, uncomplicated: Secondary | ICD-10-CM | POA: Diagnosis present

## 2019-12-12 DIAGNOSIS — I5042 Chronic combined systolic (congestive) and diastolic (congestive) heart failure: Secondary | ICD-10-CM | POA: Diagnosis present

## 2019-12-12 DIAGNOSIS — R5381 Other malaise: Secondary | ICD-10-CM

## 2019-12-12 DIAGNOSIS — Z6834 Body mass index (BMI) 34.0-34.9, adult: Secondary | ICD-10-CM | POA: Diagnosis not present

## 2019-12-12 LAB — CBG MONITORING, ED: Glucose-Capillary: 132 mg/dL — ABNORMAL HIGH (ref 70–99)

## 2019-12-12 LAB — GLUCOSE, CAPILLARY
Glucose-Capillary: 150 mg/dL — ABNORMAL HIGH (ref 70–99)
Glucose-Capillary: 159 mg/dL — ABNORMAL HIGH (ref 70–99)
Glucose-Capillary: 95 mg/dL (ref 70–99)

## 2019-12-12 LAB — RESP PANEL BY RT-PCR (FLU A&B, COVID) ARPGX2
Influenza A by PCR: NEGATIVE
Influenza B by PCR: NEGATIVE
SARS Coronavirus 2 by RT PCR: NEGATIVE

## 2019-12-12 LAB — LACTIC ACID, PLASMA: Lactic Acid, Venous: 1.5 mmol/L (ref 0.5–1.9)

## 2019-12-12 LAB — HEMOGLOBIN A1C
Hgb A1c MFr Bld: 7.4 % — ABNORMAL HIGH (ref 4.8–5.6)
Mean Plasma Glucose: 165.68 mg/dL

## 2019-12-12 MED ORDER — ENOXAPARIN SODIUM 30 MG/0.3ML ~~LOC~~ SOLN
30.0000 mg | SUBCUTANEOUS | Status: DC
  Administered 2019-12-12: 30 mg via SUBCUTANEOUS
  Filled 2019-12-12 (×2): qty 0.3

## 2019-12-12 MED ORDER — AMLODIPINE BESYLATE 5 MG PO TABS
2.5000 mg | ORAL_TABLET | Freq: Every day | ORAL | Status: DC
  Administered 2019-12-12 – 2019-12-15 (×4): 2.5 mg via ORAL
  Filled 2019-12-12 (×4): qty 1

## 2019-12-12 MED ORDER — ONDANSETRON HCL 4 MG/2ML IJ SOLN
4.0000 mg | Freq: Four times a day (QID) | INTRAMUSCULAR | Status: DC | PRN
  Administered 2019-12-12: 4 mg via INTRAVENOUS
  Filled 2019-12-12: qty 2

## 2019-12-12 MED ORDER — METOPROLOL TARTRATE 50 MG PO TABS
75.0000 mg | ORAL_TABLET | Freq: Two times a day (BID) | ORAL | Status: DC
  Administered 2019-12-12 – 2019-12-15 (×8): 75 mg via ORAL
  Filled 2019-12-12 (×8): qty 1

## 2019-12-12 MED ORDER — ENOXAPARIN SODIUM 40 MG/0.4ML ~~LOC~~ SOLN
40.0000 mg | SUBCUTANEOUS | Status: DC
  Administered 2019-12-13 – 2019-12-15 (×3): 40 mg via SUBCUTANEOUS
  Filled 2019-12-12 (×3): qty 0.4

## 2019-12-12 MED ORDER — CEFAZOLIN SODIUM-DEXTROSE 1-4 GM/50ML-% IV SOLN
1.0000 g | Freq: Once | INTRAVENOUS | Status: AC
  Administered 2019-12-12: 1 g via INTRAVENOUS
  Filled 2019-12-12: qty 50

## 2019-12-12 MED ORDER — ACETAMINOPHEN 650 MG RE SUPP: 650.0000 mg | Freq: Four times a day (QID) | RECTAL | Status: DC | PRN

## 2019-12-12 MED ORDER — MORPHINE SULFATE (PF) 2 MG/ML IV SOLN
2.0000 mg | Freq: Once | INTRAVENOUS | Status: AC
  Administered 2019-12-12: 2 mg via INTRAVENOUS
  Filled 2019-12-12: qty 1

## 2019-12-12 MED ORDER — SODIUM CHLORIDE 0.9 % IV BOLUS
1000.0000 mL | Freq: Once | INTRAVENOUS | Status: AC
  Administered 2019-12-12: 1000 mL via INTRAVENOUS

## 2019-12-12 MED ORDER — INSULIN ASPART 100 UNIT/ML ~~LOC~~ SOLN: 0.0000 [IU] | Freq: Every day | SUBCUTANEOUS | Status: DC

## 2019-12-12 MED ORDER — HYDROCODONE-ACETAMINOPHEN 5-325 MG PO TABS: 1.0000 | ORAL_TABLET | ORAL | Status: DC | PRN

## 2019-12-12 MED ORDER — OXYBUTYNIN CHLORIDE 5 MG PO TABS
5.0000 mg | ORAL_TABLET | Freq: Two times a day (BID) | ORAL | Status: DC
  Administered 2019-12-12 – 2019-12-14 (×6): 5 mg via ORAL
  Filled 2019-12-12 (×9): qty 1

## 2019-12-12 MED ORDER — ONDANSETRON HCL 4 MG PO TABS: 4.0000 mg | ORAL_TABLET | Freq: Four times a day (QID) | ORAL | Status: DC | PRN

## 2019-12-12 MED ORDER — ONDANSETRON HCL 4 MG/2ML IJ SOLN
4.0000 mg | Freq: Once | INTRAMUSCULAR | Status: AC
  Administered 2019-12-12: 4 mg via INTRAVENOUS
  Filled 2019-12-12: qty 2

## 2019-12-12 MED ORDER — CEFAZOLIN SODIUM-DEXTROSE 1-4 GM/50ML-% IV SOLN
1.0000 g | Freq: Three times a day (TID) | INTRAVENOUS | Status: DC
  Administered 2019-12-12: 1 g via INTRAVENOUS
  Filled 2019-12-12 (×5): qty 50

## 2019-12-12 MED ORDER — CEFAZOLIN SODIUM-DEXTROSE 1-4 GM/50ML-% IV SOLN
1.0000 g | Freq: Two times a day (BID) | INTRAVENOUS | Status: DC
  Administered 2019-12-13 – 2019-12-14 (×4): 1 g via INTRAVENOUS
  Filled 2019-12-12 (×6): qty 50

## 2019-12-12 MED ORDER — ACETAMINOPHEN 325 MG PO TABS: 650.0000 mg | ORAL_TABLET | Freq: Four times a day (QID) | ORAL | Status: DC | PRN

## 2019-12-12 MED ORDER — COLLAGENASE 250 UNIT/GM EX OINT
TOPICAL_OINTMENT | Freq: Every day | CUTANEOUS | Status: DC
  Filled 2019-12-12: qty 30

## 2019-12-12 MED ORDER — MORPHINE SULFATE (PF) 2 MG/ML IV SOLN
2.0000 mg | INTRAVENOUS | Status: DC | PRN
Start: 2019-12-12 — End: 2019-12-15
  Administered 2019-12-12: 2 mg via INTRAVENOUS
  Filled 2019-12-12: qty 1

## 2019-12-12 MED ORDER — LISINOPRIL 10 MG PO TABS: 20.0000 mg | ORAL_TABLET | Freq: Every day | ORAL | Status: DC

## 2019-12-12 MED ORDER — INSULIN ASPART 100 UNIT/ML ~~LOC~~ SOLN
0.0000 [IU] | Freq: Three times a day (TID) | SUBCUTANEOUS | Status: DC
  Administered 2019-12-12: 3 [IU] via SUBCUTANEOUS
  Administered 2019-12-12 – 2019-12-13 (×3): 2 [IU] via SUBCUTANEOUS
  Administered 2019-12-13 – 2019-12-15 (×4): 3 [IU] via SUBCUTANEOUS
  Filled 2019-12-12 (×8): qty 1

## 2019-12-12 NOTE — ED Provider Notes (Signed)
[facility] Emergency Department Provider Note   ____________________________________________   Event Date/Time   First MD Initiated Contact with Patient 12/12/19 0157     (approximate)  I have reviewed the triage vital signs and the nursing notes.   HISTORY  Chief Complaint Cellulitis    [name] is a 74 y.o. female sent to the ED from the wound clinic for IV antibiotics and further evaluation of BLE cellulitis.  Patient has a history of CAD, hypertension, diabetes, lymphedema, chronic venous insufficiency who last saw wound clinic in June 2021.  Husband states they waited 2 months to have an appointment to be seen today.  At that visit both legs were noted to be acutely infected.  Patient endorses chills, pain and generalized malaise.  Denies fever, cough, chest pain, shortness of breath, abdominal pain, nausea, vomiting or diarrhea.  [name] is fully vaccinated against COVID-19.     Past Medical History:  Diagnosis Date  . Aortic stenosis   . Asthma   . Coronary artery disease   . Diabetes mellitus without complication ([place])   . History of measles   . Hypertension   . Nonischemic cardiomyopathy ([place])   . Pulmonary hypertension [facility])     Patient Active Problem List   Diagnosis Date Noted  . History of multiple myeloma 10/06/2019  . Chronic HFrEF (heart failure with reduced ejection fraction) ([place]) 09/27/2017  . Urinary frequency 09/27/2017  . Chronic kidney disease, stage 3 ([place]) 03/15/2017  . B12 deficiency 03/01/2017  . Lymphedema 02/14/2017  . Chronic venous insufficiency 02/14/2017  . Nonischemic cardiomyopathy ([place]) 06/14/2016  . CAD in native artery 06/14/2016  . Bilateral lower extremity edema 06/14/2016  . Multiple myeloma not having achieved remission ([place]) 03/26/2016  . Back pain 03/26/2016  . Diverticulosis 03/20/2016  . Atherosclerosis of aorta ([place]) 03/20/2016  . Congestive heart failure ([place]) 03/07/2016  . Monoclonal  gammopathy 01/25/2016  . Chronic anemia 01/25/2016  . LVH (left ventricular hypertrophy) 08/24/2015  . Nonrheumatic aortic valve stenosis 07/14/2015  . Urinary incontinence 03/02/2015  . Diabetic retinopathy with macular edema ([place]) 02/19/2015  . Constipation 12/02/2014  . Osteoarthritis 07/31/2014  . Insomnia 06/30/2014  . Bleeding internal hemorrhoids 06/30/2014  . Osteopenia 06/30/2014  . Fecal occult blood test positive 06/30/2014  . Vitamin D deficiency 04/29/2009  . Type 2 diabetes mellitus with stage 3 chronic kidney disease, without long-term current use of insulin ([place]) 01/28/2009  . Eczema intertrigo 01/03/2008  . Hyperlipidemia 04/02/2006  . Edema 08/11/2003  . BMI 40.0-44.9, adult ([place]) 09/22/2002  . Intermittent asthma, uncontrolled 08/31/2000  . Essential hypertension 08/31/2000  . Leg varices 04/18/1999    Past Surgical History:  Procedure Laterality Date  . CARDIAC CATHETERIZATION    . Cyst(solitary) of breast:removed    . RIGHT/LEFT HEART CATH AND CORONARY ANGIOGRAPHY N/A 03/07/2016   Procedure: Right/Left Heart Cath and Coronary Angiography;  Surgeon: Nelva Bush, MD;  Location: [facility];  Service: Cardiovascular;  Laterality: N/A;  . TUBAL LIGATION      Prior to Admission medications   Medication Sig Start Date End Date Taking? Authorizing Provider  albuterol (VENTOLIN HFA) 108 (90 Base) MCG/ACT inhaler INHALE 1-2 PUFFS INTO THE LUNGS EVERY 6 (SIX) HOURS AS NEEDED FOR SHORTNESS OF BREATH. 10/17/19   Birdie Sons, MD  amLODipine (NORVASC) 2.5 MG tablet Take 1 tablet (2.5 mg total) by mouth daily. 10/06/19   End, Harrell Gave, MD  Cholecalciferol (VITAMIN D) 50 MCG (2000 UT)  tablet Take 2,000 Units by mouth daily.    [provider]  cyanocobalamin 500 MCG tablet Take 250 mcg by mouth daily.     [provider]  fluticasone furoate-vilanterol (BREO ELLIPTA) 200-25 MCG/INH AEPB Inhale 1 puff into the lungs daily. 09/30/19    Birdie Sons, MD  furosemide (LASIX) 40 MG tablet Take 1 tablet (40 mg total) by mouth daily. 07/29/19   Marrianne Mood D, PA-C  levalbuterol (XOPENEX) 1.25 MG/3ML nebulizer solution Take 1.25 mg by nebulization every 8 (eight) hours as needed for wheezing. 10/23/19   Birdie Sons, MD  lisinopril (ZESTRIL) 20 MG tablet TAKE 1 TABLET BY MOUTH EVERY DAY 08/15/19   Marrianne Mood D, PA-C  metoprolol tartrate (LOPRESSOR) 50 MG tablet Take 1.5 tablets (75 mg total) by mouth 2 (two) times daily. 06/23/19   Marrianne Mood D, PA-C  oxybutynin (DITROPAN) 5 MG tablet Take 1 tablet (5 mg total) by mouth 2 (two) times daily. 06/27/19   Festus Aloe, MD    Allergies Hydrochlorothiazide and Penicillins  Family History  Problem Relation Age of Onset  . Cancer Mother        Uterine cancer  . Diabetes Mother   . Heart disease Mother   . Heart failure Mother   . Parkinson's disease Father   . Cancer Sister        breast cancer  . Diabetes Brother        Non-insulin Dependent Diabetes Mellitus  . Mesothelioma Brother   . Hypertension Sister   . Diabetes Sister        Non-insulin dependent Diabetes Mellitus    Social History Social History   Tobacco Use  . Smoking status: Never Smoker  . Smokeless tobacco: Never Used  Vaping Use  . Vaping Use: Never used  Substance Use Topics  . Alcohol use: No  . Drug use: No    Review of Systems  Constitutional: No fever/chills Eyes: No visual changes. ENT: No sore throat. Cardiovascular: Denies chest pain. Respiratory: Denies shortness of breath. Gastrointestinal: No abdominal pain.  No nausea, no vomiting.  No diarrhea.  No constipation. Genitourinary: Negative for dysuria. Musculoskeletal: Negative for back pain. Skin: Positive for cellulitis.  Negative for rash. Neurological: Negative for headaches, focal weakness or numbness.   ____________________________________________   PHYSICAL EXAM:  VITAL SIGNS: ED Triage  Vitals  Enc Vitals Group     BP 12/11/19 1509 (!) 147/52     Pulse Rate 12/11/19 1509 68     Resp 12/11/19 1509 15     Temp 12/11/19 1509 97.6 F (36.4 C)     Temp Source 12/11/19 1509 Oral     SpO2 12/11/19 1509 100 %     Weight 12/11/19 1511 200 lb (90.7 kg)     Height 12/11/19 1511 5' 4"  (1.626 m)     Head Circumference --      Peak Flow --      Pain Score 12/11/19 1510 0     Pain Loc --      Pain Edu? --      Excl. in [place]? --     Constitutional: Alert and oriented. Chronically ill appearing and in mild acute distress. Eyes: Conjunctivae are normal. PERRL. EOMI. Head: Atraumatic. Nose: No congestion/rhinnorhea. Mouth/Throat: Mucous membranes are mildly dry.   Neck: No stridor.   Cardiovascular: Normal rate, regular rhythm. Grossly normal heart sounds.  Good peripheral circulation. Respiratory: Normal respiratory effort.  No retractions. Lungs CTAB. Gastrointestinal: Soft and  nontender to light or deep palpation. No distention. No abdominal bruits. No CVA tenderness. Musculoskeletal: BLE warmth and erythema.  Shallow open ulcers mainly to RLE.  Chronic venous stasis ulcers.  Difficult to palpate distal pulses but BLE symmetrically warm without evidence for ischemia.  No joint effusions. Neurologic:  Normal speech and language. No gross focal neurologic deficits are appreciated.  Skin:  Skin is warm, dry and intact. No rash noted.  BLE skin changes as noted below:     Psychiatric: Mood and affect are normal. Speech and behavior are normal.  ____________________________________________   LABS (all labs ordered are listed, but only abnormal results are displayed)  Labs Reviewed  COMPREHENSIVE METABOLIC PANEL - Abnormal; Notable for the following components:      Result Value   Sodium 133 (*)    Potassium 5.4 (*)    Glucose, Bld 167 (*)    BUN 70 (*)    Creatinine, Ser 2.25 (*)    Total Protein 9.5 (*)    Albumin 2.8 (*)    AST 11 (*)    GFR, Estimated 22 (*)    All  other components within normal limits  CBC WITH DIFFERENTIAL/PLATELET - Abnormal; Notable for the following components:   RBC 3.14 (*)    Hemoglobin 8.9 (*)    HCT 28.4 (*)    All other components within normal limits  CULTURE, BLOOD (ROUTINE X 2)  CULTURE, BLOOD (ROUTINE X 2)  RESP PANEL BY RT-PCR (FLU A&B, COVID) ARPGX2  AEROBIC/ANAEROBIC CULTURE (SURGICAL/DEEP WOUND)  LACTIC ACID, PLASMA  LACTIC ACID, PLASMA   ____________________________________________  EKG  None ____________________________________________  RADIOLOGY I, Lige Lakeman J, personally viewed and evaluated these images (plain radiographs) as part of my medical decision making, as well as reviewing the written report by the radiologist.  ED MD interpretation: None  Official radiology report(s): No results found.  ____________________________________________   PROCEDURES  Procedure(s) performed (including Critical Care):  Procedures   ____________________________________________   INITIAL IMPRESSION / ASSESSMENT AND PLAN / ED COURSE  As part of my medical decision making, I reviewed the following data within the [place] History obtained from family, Nursing notes reviewed and incorporated, Labs reviewed, Old chart reviewed (12/11/2019 wound care visit), Discussed with admitting physician and Notes from prior ED visits     74 year old female sent from wound clinic for BLE wounds.  Differential diagnosis includes but is not limited to cellulitis, diabetic ulcers, necrotizing fasciitis, etc.  Laboratory results demonstrate AKI.  Will obtain blood and wound cultures, lactic acid.  Initiate IV antibiotic.  IV morphine administered for pain.  Will discuss with hospitalist services for admission.      ____________________________________________   FINAL CLINICAL IMPRESSION(S) / ED DIAGNOSES  Final diagnoses:  Cellulitis of lower extremity, unspecified laterality  AKI (acute kidney  injury) ([place])  Diabetic ulcer of right lower leg, limited to breakdown of skin [facility])     ED Discharge Orders    None      *Please note:  HARTLYN REIGEL was evaluated in Emergency Department on 12/12/2019 for the symptoms described in the history of present illness. [name] was evaluated in the context of the global COVID-19 pandemic, which necessitated consideration that the patient might be at risk for infection with the SARS-CoV-2 virus that causes COVID-19. Institutional protocols and algorithms that pertain to the evaluation of patients at risk for COVID-19 are in a state of rapid change based on information released by regulatory bodies including the CDC  and federal and state organizations. These policies and algorithms were followed during the patient's care in the ED.  Some ED evaluations and interventions may be delayed as a result of limited staffing during and the pandemic.*   Note:  This document was prepared using Dragon voice recognition software and may include unintentional dictation errors.   Paulette Blanch, MD 12/12/19 [phone]

## 2019-12-12 NOTE — Progress Notes (Signed)
PHARMACIST - PHYSICIAN COMMUNICATION  CONCERNING:  Enoxaparin (Lovenox) for DVT Prophylaxis    RECOMMENDATION: Patient was prescribed enoxaprin 30mg  q24 hours for VTE prophylaxis.   Filed Weights   12/11/19 1511  Weight: 90.7 kg (200 lb)    Body mass index is 34.33 kg/m.  Estimated Creatinine Clearance: 23.6 mL/min (A) (by C-G formula based on SCr of 2.25 mg/dL (H)).   Based on [place] patient is candidate for 40mg  every 24 hours based on CrCl <24ml/min with BMI >30.  DESCRIPTION: Pharmacy has adjusted enoxaparin dose per [facility] policy.  Patient is now receiving enoxaparin 40 mg every 24 hours    Lorna Dibble, PharmD Clinical Pharmacist  12/12/2019 4:16 PM

## 2019-12-12 NOTE — ED Notes (Addendum)
Assisted pt to use bed pan. PT required a lot of direction to do so. Sacral pad placed on small bleeding wound to inner buttock. Pt readjusted and scooted up in bed. Attempted to turn lights off for pt to get rest but pt yelled to turn lights back on. PT on fall alarm and door remains open.  PT noted to have redness and yeast-like rash noted beneath pannus and in groin.  Also called lab again about A1C. Again state they will run it.

## 2019-12-12 NOTE — H&P (Signed)
History and Physical    [name] URK:[id number] DOB: [birth year] DOA: 12/12/2019  PCP: Birdie Sons, MD   Patient coming from: Home  I have personally briefly reviewed patient's old medical records in [place]  Chief Complaint: Lower leg pain swelling redness  HPI: [name] is a 74 y.o. female with medical history significant for CAD, DM, aortic stenosis, nonischemic cardiomyopathy, HTN, CKD 3a chronic venous insufficiency who was sent from the wound care clinic for evaluation in the emergency room for IV antibiotic therapy for bilateral lower extremity cellulitis.  Patient was last seen at the wound care center in June and had an appointment today after complaining of pain redness and swelling to the lower extremity.  She denies fever or chills, chest pain or shortness of breath ED Course: Vitals in the emergency room were within normal limits.  WBC normal at 7100.  Lactic acid normal.  Hemoglobin 8.9 which is her baseline.  Creatinine 2.25, up from baseline of 1.3 and potassium 5.4 Patient started on Ancef.  Hospitalist consulted for admission.  Review of Systems: As per HPI otherwise all other systems on review of systems negative.    Past Medical History:  Diagnosis Date  . Aortic stenosis   . Asthma   . Coronary artery disease   . Diabetes mellitus without complication ([place])   . History of measles   . Hypertension   . Nonischemic cardiomyopathy ([place])   . Pulmonary hypertension ([place])     Past Surgical History:  Procedure Laterality Date  . CARDIAC CATHETERIZATION    . Cyst(solitary) of breast:removed    . RIGHT/LEFT HEART CATH AND CORONARY ANGIOGRAPHY N/A 03/07/2016   Procedure: Right/Left Heart Cath and Coronary Angiography;  Surgeon: Nelva Bush, MD;  Location: [facility];  Service: Cardiovascular;  Laterality: N/A;  . TUBAL LIGATION       reports that she has never smoked. She has never used smokeless tobacco. She reports that she does  not drink alcohol and does not use drugs.  Allergies  Allergen Reactions  . Hydrochlorothiazide Other (See Comments)    Leg Cramps  . Penicillins Rash    Family History  Problem Relation Age of Onset  . Cancer Mother        Uterine cancer  . Diabetes Mother   . Heart disease Mother   . Heart failure Mother   . Parkinson's disease Father   . Cancer Sister        breast cancer  . Diabetes Brother        Non-insulin Dependent Diabetes Mellitus  . Mesothelioma Brother   . Hypertension Sister   . Diabetes Sister        Non-insulin dependent Diabetes Mellitus      Prior to Admission medications   Medication Sig Start Date End Date Taking? Authorizing Provider  albuterol (VENTOLIN HFA) 108 (90 Base) MCG/ACT inhaler INHALE 1-2 PUFFS INTO THE LUNGS EVERY 6 (SIX) HOURS AS NEEDED FOR SHORTNESS OF BREATH. 10/17/19  Yes Birdie Sons, MD  amLODipine (NORVASC) 2.5 MG tablet Take 1 tablet (2.5 mg total) by mouth daily. 10/06/19  Yes End, Harrell Gave, MD  Cholecalciferol (VITAMIN D) 50 MCG (2000 UT) tablet Take 2,000 Units by mouth daily.   Yes [provider]  cyanocobalamin 500 MCG tablet Take 250 mcg by mouth daily.    Yes [provider]  levalbuterol (XOPENEX) 1.25 MG/3ML nebulizer solution Take 1.25 mg by nebulization every 8 (eight) hours as needed  for wheezing. 10/23/19  Yes Birdie Sons, MD  lisinopril (ZESTRIL) 20 MG tablet TAKE 1 TABLET BY MOUTH EVERY DAY Patient taking differently: Take 20 mg by mouth daily. 08/15/19  Yes Visser, Jacquelyn D, PA-C  metoprolol tartrate (LOPRESSOR) 50 MG tablet Take 1.5 tablets (75 mg total) by mouth 2 (two) times daily. 06/23/19  Yes Marrianne Mood D, PA-C  oxybutynin (DITROPAN) 5 MG tablet Take 1 tablet (5 mg total) by mouth 2 (two) times daily. 06/27/19  Yes Festus Aloe, MD    Physical Exam: Vitals:   12/11/19 1511 12/11/19 1741 12/11/19 2127 12/12/19 0231  BP:  (!) 110/39 137/69 (!) 143/66  Pulse:  77 91 94   Resp:  18 18 18   Temp:      TempSrc:      SpO2:  97% 100% 100%  Weight: 90.7 kg     Height: 5\' 4"  (1.626 m)        Vitals:   12/11/19 1511 12/11/19 1741 12/11/19 2127 12/12/19 0231  BP:  (!) 110/39 137/69 (!) 143/66  Pulse:  77 91 94  Resp:  18 18 18   Temp:      TempSrc:      SpO2:  97% 100% 100%  Weight: 90.7 kg     Height: 5\' 4"  (1.626 m)         Constitutional: Alert and oriented x 3 . Not in any apparent distress HEENT:      Head: Normocephalic and atraumatic.         Eyes: PERLA, EOMI, Conjunctivae are normal. Sclera is non-icteric.       Mouth/Throat: Mucous membranes are moist.       Neck: Supple with no signs of meningismus. Cardiovascular: Regular rate and rhythm. No murmurs, gallops, or rubs. 2+ symmetrical distal pulses are present . No JVD. No 2+LE edema Respiratory: Respiratory effort normal .Lungs sounds clear bilaterally. No wheezes, crackles, or rhonchi.  Gastrointestinal: Soft, non tender, and non distended with positive bowel sounds. No rebound or guarding. Genitourinary: No CVA tenderness. Musculoskeletal: Pain redness swelling bilateral lower extremities up to mid lower leg. Neurologic:  Face is symmetric. Moving all extremities. No gross focal neurologic deficits . Skin: Skin is warm, dry.  No rash or ulcers Psychiatric: Mood and affect are normal    Labs on Admission: I have personally reviewed following labs and imaging studies  CBC: Recent Labs  Lab 12/11/19 1511  WBC 7.1  NEUTROABS 4.5  HGB 8.9*  HCT 28.4*  MCV 90.4  PLT 470   Basic Metabolic Panel: Recent Labs  Lab 12/11/19 1511  NA 133*  K 5.4*  CL 104  CO2 23  GLUCOSE 167*  BUN 70*  CREATININE 2.25*  CALCIUM 9.6   GFR: Estimated Creatinine Clearance: 23.6 mL/min (A) (by C-G formula based on SCr of 2.25 mg/dL (H)). Liver Function Tests: Recent Labs  Lab 12/11/19 1511  AST 11*  ALT 7  ALKPHOS 79  BILITOT 0.4  PROT 9.5*  ALBUMIN 2.8*   No results for input(s):  LIPASE, AMYLASE in the last 168 hours. No results for input(s): AMMONIA in the last 168 hours. Coagulation Profile: No results for input(s): INR, PROTIME in the last 168 hours. Cardiac Enzymes: No results for input(s): CKTOTAL, CKMB, CKMBINDEX, TROPONINI in the last 168 hours. BNP (last 3 results) No results for input(s): PROBNP in the last 8760 hours. HbA1C: No results for input(s): HGBA1C in the last 72 hours. CBG: No results for input(s): GLUCAP in the  last 168 hours. Lipid Profile: No results for input(s): CHOL, HDL, LDLCALC, TRIG, CHOLHDL, LDLDIRECT in the last 72 hours. Thyroid Function Tests: No results for input(s): TSH, T4TOTAL, FREET4, T3FREE, THYROIDAB in the last 72 hours. Anemia Panel: No results for input(s): VITAMINB12, FOLATE, FERRITIN, TIBC, IRON, RETICCTPCT in the last 72 hours. Urine analysis:    Component Value Date/Time   APPEARANCEUR Hazy (A) 07/31/2018 0841   GLUCOSEU Negative 07/31/2018 0841   BILIRUBINUR Negative 07/31/2018 0841   KETONESUR negative 09/21/2016 1249   PROTEINUR Negative 07/31/2018 0841   UROBILINOGEN 0.2 01/25/2018 1143   NITRITE Negative 07/31/2018 0841   LEUKOCYTESUR 2+ (A) 07/31/2018 0841    Radiological Exams on Admission: No results found.   Assessment/Plan 74 year old female with history of CAD, DM, aortic stenosis, nonischemic cardiomyopathy, HTN, CKD 3a chronic venous insufficiency who was sent from the wound care clinic for evaluation in the emergency room for IV antibiotic therapy for bilateral lower extremity cellulitis.     Bilateral lower leg cellulitis   Chronic venous insufficiency -Ancef -Keep legs elevated -Wound care consult    Acute kidney injury superimposed on CKD 3b (HCC) -Creatinine 2.25 up from baseline of 1.3 -Gentle IV hydration    Type 2 diabetes mellitus with stage 3 chronic kidney disease, without long-term current use of insulin (HCC) -Sliding scale insulin coverage    Essential  hypertension -Continue home lisinopril, amlodipine and metoprolol    Nonrheumatic aortic valve stenosis -No acute disease    Chronic anemia of CKD -Hemoglobin 8.9 at baseline  Chronic HFrEF (heart failure with reduced ejection fraction) (HCC)   Nonischemic cardiomyopathy (HCC)   CAD in native artery -Chronic and stable.  No complaints of chest pain -Continue home metoprolol and lisinopril      DVT prophylaxis: Lovenox  Code Status: full code  Family Communication:  none  Disposition Plan: Back to previous home environment Consults called: none  Status:At the time of admission, it appears that the appropriate admission status for this patient is INPATIENT. This is judged to be reasonable and necessary in order to provide the required intensity of service to ensure the patient's safety given the presenting symptoms, physical exam findings, and initial radiographic and laboratory data in the context of their  Comorbid conditions.   Patient requires inpatient status due to high intensity of service, high risk for further deterioration and high frequency of surveillance required.   I certify that at the point of admission it is my clinical judgment that the patient will require inpatient hospital care spanning beyond [place] MD [facility]     12/12/2019, 2:48 AM

## 2019-12-12 NOTE — Progress Notes (Signed)
Patient seen and examined at the bedside. Her husband is present at the bedside as well. Consult wound care nurse for bilateral leg wounds. Continue current treatment.

## 2019-12-12 NOTE — Consult Note (Signed)
WOC Nurse Consult Note: Reason for Consult:Chronic nonhealing wounds to bilateral anterior and lateral lower legs.  Seen by wound care center yesterday after long delay getting in to be seen.  Active cellulitis present.  On IV antibiotics now.  Will implement topical care.  Refuses compression.  Wound type:chronic venous insufficiency, infectious Pressure Injury POA: NA Measurement: scattered scabbed abrasions from knee to dorsal foot.  Chronic nonhealing.  Wound WTG:RMBOBOF Drainage (amount, consistency, odor) moderate serosanguinous  No odor.  Periwound:edema and erythema Dressing procedure/placement/frequency: Cleanse legs with soap and water and pat dry.  Apply mupirocin ointment to scabbed lesions.  Cover with xeroform gauze and wrap with kerlix/tape. Change daily.  Will not follow at this time.  Please re-consult if needed.  Domenic Moras MSN, RN, FNP-BC CWON Wound, Ostomy, Continence Nurse Pager [phone]

## 2019-12-12 NOTE — Progress Notes (Signed)
PHARMACIST - PHYSICIAN COMMUNICATION  CONCERNING:  Enoxaparin (Lovenox) for DVT Prophylaxis    RECOMMENDATION: Patient was prescribed enoxaprin 40mg  q24 hours for VTE prophylaxis.   Filed Weights   12/11/19 1511  Weight: 90.7 kg (200 lb)    Body mass index is 34.33 kg/m.  Estimated Creatinine Clearance: 23.6 mL/min (A) (by C-G formula based on SCr of 2.25 mg/dL (H)).   Patient is candidate for enoxaparin 30mg  every 24 hours based on CrCl <73ml/min or Weight <45kg  DESCRIPTION: Pharmacy has adjusted enoxaparin dose per [facility] policy.  Patient is now receiving enoxaparin 30 mg every 24 hours    Ena Dawley, PharmD Clinical Pharmacist  12/12/2019 6:24 AM

## 2019-12-12 NOTE — Progress Notes (Signed)
Dressings applied to both legs with ointment, xeroform, and kerlix.  Pt tolerated well.

## 2019-12-12 NOTE — Progress Notes (Signed)
PHARMACY NOTE:  ANTIMICROBIAL RENAL DOSAGE ADJUSTMENT  Current antimicrobial regimen includes a mismatch between antimicrobial dosage and estimated renal function.  As per policy approved by the Pharmacy & Therapeutics and Medical Executive Committees, the antimicrobial dosage will be adjusted accordingly.  Current antimicrobial dosage:  Cefazolin 1g q8H  Indication: Cellulitis  Renal Function:  Estimated Creatinine Clearance: 23.6 mL/min (A) (by C-G formula based on SCr of 2.25 mg/dL (H)).    Antimicrobial dosage has been changed to:  Cefazolin 1g q12H  Additional comments:   Thank you for allowing pharmacy to be a part of this patient's care.  Lorna Dibble, [facility] 12/12/2019 4:12 PM

## 2019-12-12 NOTE — ED Notes (Signed)
Called lab to add on HGB A1C

## 2019-12-13 DIAGNOSIS — I5022 Chronic systolic (congestive) heart failure: Secondary | ICD-10-CM

## 2019-12-13 LAB — BASIC METABOLIC PANEL
Anion gap: 7 (ref 5–15)
BUN: 51 mg/dL — ABNORMAL HIGH (ref 8–23)
CO2: 22 mmol/L (ref 22–32)
Calcium: 9.3 mg/dL (ref 8.9–10.3)
Chloride: 109 mmol/L (ref 98–111)
Creatinine, Ser: 1.86 mg/dL — ABNORMAL HIGH (ref 0.44–1.00)
GFR, Estimated: 28 mL/min — ABNORMAL LOW (ref >60)
Glucose, Bld: 111 mg/dL — ABNORMAL HIGH (ref 70–99)
Potassium: 4.6 mmol/L (ref 3.5–5.1)
Sodium: 138 mmol/L (ref 135–145)

## 2019-12-13 LAB — CBC WITH DIFFERENTIAL/PLATELET
Abs Immature Granulocytes: 0.02 10*3/uL (ref 0.00–0.07)
Basophils Absolute: 0 10*3/uL (ref 0.0–0.1)
Basophils Relative: 0 %
Eosinophils Absolute: 0.2 10*3/uL (ref 0.0–0.5)
Eosinophils Relative: 3 %
HCT: 26.1 % — ABNORMAL LOW (ref 36.0–46.0)
Hemoglobin: 8 g/dL — ABNORMAL LOW (ref 12.0–15.0)
Immature Granulocytes: 0 %
Lymphocytes Relative: 22 %
Lymphs Abs: 1.3 10*3/uL (ref 0.7–4.0)
MCH: 27.9 pg (ref 26.0–34.0)
MCHC: 30.7 g/dL (ref 30.0–36.0)
MCV: 90.9 fL (ref 80.0–100.0)
Monocytes Absolute: 0.6 10*3/uL (ref 0.1–1.0)
Monocytes Relative: 11 %
Neutro Abs: 3.7 10*3/uL (ref 1.7–7.7)
Neutrophils Relative %: 64 %
Platelets: 249 10*3/uL (ref 150–400)
RBC: 2.87 MIL/uL — ABNORMAL LOW (ref 3.87–5.11)
RDW: 15.5 % (ref 11.5–15.5)
WBC: 5.8 10*3/uL (ref 4.0–10.5)
nRBC: 0 % (ref 0.0–0.2)

## 2019-12-13 LAB — GLUCOSE, CAPILLARY
Glucose-Capillary: 96 mg/dL (ref 70–99)
Glucose-Capillary: 148 mg/dL — ABNORMAL HIGH (ref 70–99)
Glucose-Capillary: 162 mg/dL — ABNORMAL HIGH (ref 70–99)
Glucose-Capillary: 115 mg/dL — ABNORMAL HIGH (ref 70–99)

## 2019-12-13 NOTE — Progress Notes (Signed)
Patient was agitated and combative throughout the shift. She attempted to pull out her IV and get out of the bed. Md covering provider O[place] NP aware. Will continue to monitor for patient safety

## 2019-12-13 NOTE — Plan of Care (Signed)
?  Problem: Clinical Measurements: ?Goal: Respiratory complications will improve ?Outcome: Progressing ?Goal: Cardiovascular complication will be avoided ?Outcome: Progressing ?  ?Problem: Safety: ?Goal: Ability to remain free from injury will improve ?Outcome: Progressing ?  ?

## 2019-12-13 NOTE — Progress Notes (Addendum)
Progress Note    WYNNIE Watson  NTI:[id number] DOB: 10-May-1945  DOA: 12/12/2019 PCP: Birdie Sons, MD      Brief Narrative:    Medical records reviewed and are as summarized below:  [name] is a 74 y.o. female with medical history significant for CAD, type II DM, aortic stenosis, chronic diastolic CHF, hypertension, CKD stage IV, cognitive impairment with suspected underlying dementia, chronic venous insufficiency with chronic wounds on bilateral legs, who was sent from the wound care clinic to the emergency room for evaluation of bilateral lower extremity cellulitis.  She was admitted to [facility] and treated with IV Ancef and IV fluids.  She was evaluated by the wound care nurse.    Assessment/Plan:   Principal Problem:   Bilateral lower leg cellulitis Active Problems:   Type 2 diabetes mellitus with stage 4 chronic kidney disease (HCC)   Essential hypertension   Nonrheumatic aortic valve stenosis   Chronic anemia   Nonischemic cardiomyopathy (HCC)   CAD in native artery   Chronic venous insufficiency   Chronic kidney disease, stage 3 (HCC)   Chronic HFrEF (heart failure with reduced ejection fraction) (HCC)   CKD (chronic kidney disease), stage IV (HCC)   Body mass index is 34.33 kg/m.  (Obesity)   Lower leg cellulitis with underlying chronic venous insufficiency and chronic wounds on bilateral legs: Continue IV Ancef.  Follow-up blood cultures.  Continue local wound care.  Appreciate help from wound care nurse.  Type II DM: Hemoglobin A1c 7.4 NovoLog as needed for hyperglycemia.  Hypertension: Continue amlodipine and metoprolol.  Lisinopril has been held.  Chronic diastolic CHF, mild aortic valve stenosis: Compensated.  2D echo April 2021 showed EF estimated at 60 - 65% and grade 1 diastolic dysfunction  CAD: Continue metoprolol  CKD stage IV: Creatinine is stable.  Cognitive impairment with suspected underlying dementia: Supportive  care  Generalized weakness: PT and OT evaluation   Diet Order            Diet heart healthy/carb modified Room service appropriate? Yes; Fluid consistency: Thin  Diet effective now                    Consultants:  None  Procedures:  None    Medications:   . amLODipine  2.5 mg Oral Daily  . collagenase   Topical Daily  . enoxaparin (LOVENOX) injection  40 mg Subcutaneous Q24H  . insulin aspart  0-15 Units Subcutaneous TID WC  . insulin aspart  0-5 Units Subcutaneous QHS  . metoprolol tartrate  75 mg Oral BID  . oxybutynin  5 mg Oral BID   Continuous Infusions: .  ceFAZolin (ANCEF) IV 1 g (12/13/19 0906)     Anti-infectives (From admission, onward)   Start     Dose/Rate Route Frequency Ordered Stop   12/12/19 2200  ceFAZolin (ANCEF) IVPB 1 g/50 mL premix        1 g 100 mL/hr over 30 Minutes Intravenous Every 12 hours 12/12/19 1611     12/12/19 1100  ceFAZolin (ANCEF) IVPB 1 g/50 mL premix  Status:  Discontinued        1 g 100 mL/hr over 30 Minutes Intravenous Every 8 hours 12/12/19 0246 12/12/19 1611   12/12/19 0230  ceFAZolin (ANCEF) IVPB 1 g/50 mL premix        1 g 100 mL/hr over 30 Minutes Intravenous  Once 12/12/19 0215 12/12/19 0323  Family Communication/Anticipated D/C date and plan/Code Status   DVT prophylaxis: enoxaparin (LOVENOX) injection 40 mg Start: 12/13/19 1000     Code Status: Full Code  Family Communication: None Disposition Plan:    Status is: Inpatient  Remains inpatient appropriate because:IV treatments appropriate due to intensity of illness or inability to take PO   Dispo: The patient is from: Home              Anticipated d/c is to: Home              Anticipated d/c date is: 1 day              Patient currently is not medically stable to d/c.           Subjective:   Interval events noted.  Patient is confused unable to provide any history.  Objective:    Vitals:   12/12/19 1526  12/12/19 1925 12/13/19 0008 12/13/19 0800  BP: 125/70 (!) 155/78 (!) 148/92 121/61  Pulse: 80 90 95 89  Resp: 15 18 17 16   Temp: 98 F (36.7 C) 98.2 F (36.8 C) 98 F (36.7 C) 97.7 F (36.5 C)  TempSrc: Oral Oral Oral   SpO2: 93% 100% 99% 100%  Weight:      Height:       No data found.   Intake/Output Summary (Last 24 hours) at 12/13/2019 1111 Last data filed at 12/13/2019 0700 Gross per 24 hour  Intake 287.61 ml  Output 800 ml  Net -512.39 ml   Filed Weights   12/11/19 1511  Weight: 90.7 kg    Exam:  GEN: NAD SKIN: Multiple chronic wounds on bilateral legs EYES: No pallor or icterus ENT: MMM CV: RRR PULM: CTA B ABD: soft, obese, NT, +BS CNS: Alert but confused, non focal EXT: Bilateral leg swelling with erythema and tenderness though erythema and tenderness have improved.   Data Reviewed:   I have personally reviewed following labs and imaging studies:  Labs: Labs show the following:   Basic Metabolic Panel: Recent Labs  Lab 12/11/19 1511 12/13/19 0756  NA 133* 138  K 5.4* 4.6  CL 104 109  CO2 23 22  GLUCOSE 167* 111*  BUN 70* 51*  CREATININE 2.25* 1.86*  CALCIUM 9.6 9.3   GFR Estimated Creatinine Clearance: 28.5 mL/min (A) (by C-G formula based on SCr of 1.86 mg/dL (H)). Liver Function Tests: Recent Labs  Lab 12/11/19 1511  AST 11*  ALT 7  ALKPHOS 79  BILITOT 0.4  PROT 9.5*  ALBUMIN 2.8*   No results for input(s): LIPASE, AMYLASE in the last 168 hours. No results for input(s): AMMONIA in the last 168 hours. Coagulation profile No results for input(s): INR, PROTIME in the last 168 hours.  CBC: Recent Labs  Lab 12/11/19 1511 12/13/19 0756  WBC 7.1 5.8  NEUTROABS 4.5 3.7  HGB 8.9* 8.0*  HCT 28.4* 26.1*  MCV 90.4 90.9  PLT 310 249   Cardiac Enzymes: No results for input(s): CKTOTAL, CKMB, CKMBINDEX, TROPONINI in the last 168 hours. BNP (last 3 results) No results for input(s): PROBNP in the last 8760 hours. CBG: Recent  Labs  Lab 12/12/19 0852 12/12/19 1133 12/12/19 1639 12/12/19 2116 12/13/19 0800  GLUCAP 132* 150* 159* 95 96   D-Dimer: No results for input(s): DDIMER in the last 72 hours. Hgb A1c: Recent Labs    12/11/19 1511  HGBA1C 7.4*   Lipid Profile: No results for input(s): CHOL, HDL, LDLCALC, TRIG, CHOLHDL,  LDLDIRECT in the last 72 hours. Thyroid function studies: No results for input(s): TSH, T4TOTAL, T3FREE, THYROIDAB in the last 72 hours.  Invalid input(s): FREET3 Anemia work up: No results for input(s): VITAMINB12, FOLATE, FERRITIN, TIBC, IRON, RETICCTPCT in the last 72 hours. Sepsis Labs: Recent Labs  Lab 12/11/19 1511 12/12/19 0222 12/13/19 0756  WBC 7.1  --  5.8  LATICACIDVEN  --  1.5  --     Microbiology Recent Results (from the past 240 hour(s))  Culture, blood (routine x 2)     Status: None (Preliminary result)   Collection Time: 12/12/19  2:22 AM   Specimen: BLOOD  Result Value Ref Range Status   Specimen Description BLOOD RIGHT HAND  Final   Special Requests   Final    BOTTLES DRAWN AEROBIC AND ANAEROBIC Blood Culture results may not be optimal due to an inadequate volume of blood received in culture bottles   Culture   Final    NO GROWTH 1 DAY Performed at [facility], [address] [place], [place] [address]    Report Status PENDING  Incomplete  Resp Panel by RT-PCR (Flu A&B, Covid) Nasopharyngeal Swab     Status: None   Collection Time: 12/12/19  2:22 AM   Specimen: Nasopharyngeal Swab; Nasopharyngeal(NP) swabs in vial transport medium  Result Value Ref Range Status   SARS Coronavirus 2 by RT PCR NEGATIVE NEGATIVE Final    Comment: (NOTE) SARS-CoV-2 target nucleic acids are NOT DETECTED.  The SARS-CoV-2 RNA is generally detectable in upper respiratory specimens during the acute phase of infection. The lowest concentration of SARS-CoV-2 viral copies this assay can detect is 138 copies/mL. A negative result does not preclude  SARS-Cov-2 infection and should not be used as the sole basis for treatment or other patient management decisions. A negative result may occur with  improper specimen collection/handling, submission of specimen other than nasopharyngeal swab, presence of viral mutation(s) within the areas targeted by this assay, and inadequate number of viral copies(<138 copies/mL). A negative result must be combined with clinical observations, patient history, and epidemiological information. The expected result is Negative.  Fact Sheet for Patients:  EntrepreneurPulse.com.au  Fact Sheet for Healthcare Providers:  IncredibleEmployment.be  This test is no t yet approved or cleared by the [place] FDA and  has been authorized for detection and/or diagnosis of SARS-CoV-2 by FDA under an Emergency Use Authorization (EUA). This EUA will remain  in effect (meaning this test can be used) for the duration of the COVID-19 declaration under Section 564(b)(1) of the Act, 21 U.S.C.section 360bbb-3(b)(1), unless the authorization is terminated  or revoked sooner.       Influenza A by PCR NEGATIVE NEGATIVE Final   Influenza B by PCR NEGATIVE NEGATIVE Final    Comment: (NOTE) The Xpert Xpress SARS-CoV-2/FLU/RSV plus assay is intended as an aid in the diagnosis of influenza from Nasopharyngeal swab specimens and should not be used as a sole basis for treatment. Nasal washings and aspirates are unacceptable for Xpert Xpress SARS-CoV-2/FLU/RSV testing.  Fact Sheet for Patients: EntrepreneurPulse.com.au  Fact Sheet for Healthcare Providers: IncredibleEmployment.be  This test is not yet approved or cleared by the [place] FDA and has been authorized for detection and/or diagnosis of SARS-CoV-2 by FDA under an Emergency Use Authorization (EUA). This EUA will remain in effect (meaning this test can be used) for the duration of  the COVID-19 declaration under Section 564(b)(1) of the Act, 21 U.S.C. section 360bbb-3(b)(1), unless the authorization is terminated  or revoked.  Performed at [facility], [address] [place], [place] [address]     Procedures and diagnostic studies:  No results found.             LOS: 1 day   Malacki Mcphearson  [facility]   Pager on [email or web address]. If 7PM-7AM, please contact night-coverage at [email or web address]     12/13/2019, 11:11 AM

## 2019-12-13 NOTE — Progress Notes (Signed)
OT Cancellation Note  Patient Details Name: [name] MRN: [id number] DOB: [birth year]   Cancelled Treatment:    Reason Eval/Treat Not Completed: Patient at procedure or test/ unavailable (OT initial eval attempted. Pt. with nursing/wound care. Will reattempt the initial OT visit at a later time, or date.)  Harrel Carina, MS, OTR/L 12/13/2019, 2:53 PM

## 2019-12-14 LAB — CBC WITH DIFFERENTIAL/PLATELET
Abs Immature Granulocytes: 0.01 10*3/uL (ref 0.00–0.07)
Basophils Absolute: 0 10*3/uL (ref 0.0–0.1)
Basophils Relative: 0 %
Eosinophils Absolute: 0.3 10*3/uL (ref 0.0–0.5)
Eosinophils Relative: 4 %
HCT: 26.5 % — ABNORMAL LOW (ref 36.0–46.0)
Hemoglobin: 8.1 g/dL — ABNORMAL LOW (ref 12.0–15.0)
Immature Granulocytes: 0 %
Lymphocytes Relative: 29 %
Lymphs Abs: 1.7 10*3/uL (ref 0.7–4.0)
MCH: 27.9 pg (ref 26.0–34.0)
MCHC: 30.6 g/dL (ref 30.0–36.0)
MCV: 91.4 fL (ref 80.0–100.0)
Monocytes Absolute: 0.7 10*3/uL (ref 0.1–1.0)
Monocytes Relative: 11 %
Neutro Abs: 3.1 10*3/uL (ref 1.7–7.7)
Neutrophils Relative %: 56 %
Platelets: 253 10*3/uL (ref 150–400)
RBC: 2.9 MIL/uL — ABNORMAL LOW (ref 3.87–5.11)
RDW: 15.3 % (ref 11.5–15.5)
WBC: 5.8 10*3/uL (ref 4.0–10.5)
nRBC: 0 % (ref 0.0–0.2)

## 2019-12-14 LAB — GLUCOSE, CAPILLARY
Glucose-Capillary: 108 mg/dL — ABNORMAL HIGH (ref 70–99)
Glucose-Capillary: 165 mg/dL — ABNORMAL HIGH (ref 70–99)
Glucose-Capillary: 186 mg/dL — ABNORMAL HIGH (ref 70–99)
Glucose-Capillary: 122 mg/dL — ABNORMAL HIGH (ref 70–99)

## 2019-12-14 LAB — BASIC METABOLIC PANEL
Anion gap: 6 (ref 5–15)
BUN: 43 mg/dL — ABNORMAL HIGH (ref 8–23)
CO2: 23 mmol/L (ref 22–32)
Calcium: 9.2 mg/dL (ref 8.9–10.3)
Chloride: 109 mmol/L (ref 98–111)
Creatinine, Ser: 1.77 mg/dL — ABNORMAL HIGH (ref 0.44–1.00)
GFR, Estimated: 30 mL/min — ABNORMAL LOW (ref >60)
Glucose, Bld: 118 mg/dL — ABNORMAL HIGH (ref 70–99)
Potassium: 4.6 mmol/L (ref 3.5–5.1)
Sodium: 138 mmol/L (ref 135–145)

## 2019-12-14 MED ORDER — CEFAZOLIN SODIUM-DEXTROSE 1-4 GM/50ML-% IV SOLN
1.0000 g | Freq: Three times a day (TID) | INTRAVENOUS | Status: DC
  Administered 2019-12-14 – 2019-12-15 (×2): 1 g via INTRAVENOUS
  Filled 2019-12-14 (×3): qty 50

## 2019-12-14 NOTE — Evaluation (Signed)
Occupational Therapy Evaluation Patient Details Name: [name] MRN: [id number] DOB: 1945/12/09 Today's Date: 12/14/2019    History of Present Illness [name] is a 74 y.o. female with medical history significant for CAD, type II DM, aortic stenosis, chronic diastolic CHF, hypertension, CKD stage IV, cognitive impairment with suspected underlying dementia, chronic venous insufficiency with chronic wounds on bilateral legs, who was sent from the wound care clinic to the emergency room for evaluation of bilateral lower extremity cellulitis.   Clinical Impression   Ms Steedley was seen for OT evaluation this date. Prior to hospital admission, pt required assistance from husband for dressing, SPT recliner<>w/c, and pt was receiving [facility] therapy services. Pt lives c husband in 1 level home c ramped entrance. Pt presents to acute OT demonstrating impaired ADL performance and functional mobility 2/2 decreased activity tolerance, poor insight into deficits, and functional strength/balance deficits.   Upon arrival husband at bedside stating pt is ready to return home. Husband assisted c t/fs this session and reports pt is near baseline mobility. MIN A for bed mobility (pt normally sleeps in recliner) and MIN A x2 for SPT bed>BSC (OT provided +2 for safety and balance assist) and MOD A for SPT BSC>bed c OT assist only. MOD A don underwear - assist to thread over feet, able to pull over rear c MIN standing balance assistance. Pt would benefit from skilled OT to address noted impairments and functional limitations (see below for any additional details) in order to maximize safety and independence while minimizing falls risk and caregiver burden. Upon hospital discharge, recommend HHOT to maximize pt safety and return to functional independence during meaningful occupations of daily life.     Follow Up Recommendations  Home health OT;Supervision/Assistance - 24 hour    Equipment Recommendations   Wheelchair (measurements OT)    Recommendations for Other Services       Precautions / Restrictions Precautions Precautions: Fall Restrictions Weight Bearing Restrictions: No      Mobility Bed Mobility Overal bed mobility: Needs Assistance Bed Mobility: Supine to Sit     Supine to sit: Min assist;HOB elevated       Transfers Overall transfer level: Needs assistance Equipment used: 1 person hand held assist Transfers: Sit to/from [facility] Sit to Stand: Min assist;+2 physical assistance Stand pivot transfers: Min assist;+2 physical assistance       Balance Overall balance assessment: Needs assistance Sitting-balance support: Bilateral upper extremity supported;Feet supported Sitting balance-Leahy Scale: Fair     Standing balance support: Single extremity supported;During functional activity Standing balance-Leahy Scale: Poor                             ADL either performed or assessed with clinical judgement   ADL Overall ADL's : Needs assistance/impaired                                       General ADL Comments: MOD A don underwear - assist to thread over feet - able to pull over rear c MIN standing balance assistance                  Pertinent Vitals/Pain Pain Assessment: No/denies pain     Hand Dominance     Extremity/Trunk Assessment Upper Extremity Assessment Upper Extremity Assessment: Generalized weakness   Lower Extremity Assessment Lower Extremity Assessment: Generalized  weakness       Communication Communication Communication: HOH   Cognition Arousal/Alertness: Awake/alert Behavior During Therapy: Flat affect Overall Cognitive Status: History of cognitive impairments - at baseline                                 General Comments: Pt requires cues t/o   General Comments       Exercises Exercises: Other exercises Other Exercises Other Exercises: Pt and family  educated re: OT role, DME recs, d/c recs, falls prevention, importance of mobility for functional strengthening Other Exercises: LBD, toileting, sup>sit, sit<>stand x3, SPT bed<>BSC>chair   Shoulder Instructions      Home Living Family/patient expects to be discharged to:: Private residence Living Arrangements: Spouse/significant other Available Help at Discharge: Family;Available 24 hours/day Type of Home: House Home Access: Ramped entrance     Home Layout: One level     Bathroom Shower/Tub: Tub/shower unit         Home Equipment: Other (comment) (lift chair and old w/c bought at [employer])          Prior Functioning/Environment Level of Independence: Needs assistance  Gait / Transfers Assistance Needed: husband assists c SPT recliner<>w/c ADL's / Homemaking Assistance Needed: husband assists c LBD and reports pt does not bathe often (sponge bathes herself in sitting)            OT Problem List: Decreased strength;Decreased activity tolerance;Impaired balance (sitting and/or standing);Decreased safety awareness      OT Treatment/Interventions: Self-care/ADL training;Therapeutic exercise;Energy conservation;DME and/or AE instruction;Therapeutic activities;Patient/family education;Balance training    OT Goals(Current goals can be found in the care plan section) Acute Rehab OT Goals Patient Stated Goal: To return home OT Goal Formulation: With patient/family Time For Goal Achievement: 12/28/19 Potential to Achieve Goals: Fair ADL Goals Pt Will Perform Grooming: with modified independence;sitting Pt Will Perform Lower Body Dressing: with min assist;with caregiver independent in assisting;sit to/from stand Pt Will Transfer to Toilet: with min assist;stand pivot transfer;bedside commode  OT Frequency: Min 1X/week    AM-PAC OT "6 Clicks" Daily Activity     Outcome Measure Help from another person eating meals?: None Help from another person taking care of personal  grooming?: None Help from another person toileting, which includes using toliet, bedpan, or urinal?: A Lot Help from another person bathing (including washing, rinsing, drying)?: A Lot Help from another person to put on and taking off regular upper body clothing?: A Little Help from another person to put on and taking off regular lower body clothing?: A Lot 6 Click Score: 17   End of Session Nurse Communication: Mobility status  Activity Tolerance: Patient tolerated treatment well Patient left: in chair;with call bell/phone within reach;with chair alarm set;with family/visitor present  OT Visit Diagnosis: Other abnormalities of gait and mobility (R26.89);Muscle weakness (generalized) (M62.81)                Time: 3254-9826 OT Time Calculation (min): 32 min Charges:  OT General Charges $OT Visit: 1 Visit OT Evaluation $OT Eval Moderate Complexity: 1 Mod OT Treatments $Self Care/Home Management : 23-37 mins  Dessie Coma, M.S. OTR/L  12/14/19, 1:32 PM  ascom [phone]

## 2019-12-14 NOTE — Progress Notes (Signed)
PHARMACY NOTE:  ANTIMICROBIAL RENAL DOSAGE ADJUSTMENT  Current antimicrobial regimen includes a mismatch between antimicrobial dosage and estimated renal function.  As per policy approved by the Pharmacy & Therapeutics and Medical Executive Committees, the antimicrobial dosage will be adjusted accordingly.  Current antimicrobial dosage:  Cefazolin 1g IV q12H  Indication: Cellulitis  Renal Function:  Estimated Creatinine Clearance: 30 mL/min (A) (by C-G formula based on SCr of 1.77 mg/dL (H)).    Antimicrobial dosage has been changed to:  Cefazolin 1g IV q8H  Additional comments: CrCl borderline but SCr trending down   Thank you for allowing pharmacy to be a part of this patient's care.  Rocky Morel, [facility] 12/14/2019 3:50 PM

## 2019-12-14 NOTE — Progress Notes (Signed)
Progress Note    [name]  ZOX:[id number] DOB: 11/24/1945  DOA: 12/12/2019 PCP: Birdie Sons, MD      Brief Narrative:    Medical records reviewed and are as summarized below:  [name] is a 74 y.o. female with medical history significant for CAD, type II DM, aortic stenosis, chronic diastolic CHF, hypertension, CKD stage IV, cognitive impairment with suspected underlying dementia, chronic venous insufficiency with chronic wounds on bilateral legs, who was sent from the wound care clinic to the emergency room for evaluation of bilateral lower extremity cellulitis.  She was admitted to [facility] and treated with IV Ancef and IV fluids.  She was evaluated by the wound care nurse.    Assessment/Plan:   Principal Problem:   Bilateral lower leg cellulitis Active Problems:   Type 2 diabetes mellitus with stage 4 chronic kidney disease (HCC)   Essential hypertension   Nonrheumatic aortic valve stenosis   Chronic anemia   Nonischemic cardiomyopathy (HCC)   CAD in native artery   Chronic venous insufficiency   Chronic kidney disease, stage 3 (HCC)   Chronic HFrEF (heart failure with reduced ejection fraction) (HCC)   CKD (chronic kidney disease), stage IV (HCC)   Body mass index is 34.33 kg/m.  (Obesity)   Lower leg cellulitis with underlying chronic venous insufficiency and chronic wounds on bilateral legs: She has been refusing to take oral medications at times so we will continue with IV Ancef for now.  No growth on blood cultures thus far.  Continue local wound care.  Appreciate help from wound care nurse.  Type II DM: Hemoglobin A1c 7.4 NovoLog as needed for hyperglycemia.  Hypertension: Continue amlodipine and metoprolol.  Lisinopril has been held.  Chronic diastolic CHF, mild aortic valve stenosis: Compensated.  2D echo April 2021 showed EF estimated at 60 - 65% and grade 1 diastolic dysfunction  CAD: Continue metoprolol  CKD stage IV: Creatinine is  stable.  Cognitive impairment with suspected underlying dementia: Supportive care  Generalized weakness: PT and OT evaluation   Diet Order            Diet heart healthy/carb modified Room service appropriate? Yes; Fluid consistency: Thin  Diet effective now                    Consultants:  None  Procedures:  None    Medications:    amLODipine  2.5 mg Oral Daily   collagenase   Topical Daily   enoxaparin (LOVENOX) injection  40 mg Subcutaneous Q24H   insulin aspart  0-15 Units Subcutaneous TID WC   insulin aspart  0-5 Units Subcutaneous QHS   metoprolol tartrate  75 mg Oral BID   oxybutynin  5 mg Oral BID   Continuous Infusions:   ceFAZolin (ANCEF) IV 1 g (12/14/19 0927)     Anti-infectives (From admission, onward)   Start     Dose/Rate Route Frequency Ordered Stop   12/12/19 2200  ceFAZolin (ANCEF) IVPB 1 g/50 mL premix        1 g 100 mL/hr over 30 Minutes Intravenous Every 12 hours 12/12/19 1611     12/12/19 1100  ceFAZolin (ANCEF) IVPB 1 g/50 mL premix  Status:  Discontinued        1 g 100 mL/hr over 30 Minutes Intravenous Every 8 hours 12/12/19 0246 12/12/19 1611   12/12/19 0230  ceFAZolin (ANCEF) IVPB 1 g/50 mL premix  1 g 100 mL/hr over 30 Minutes Intravenous  Once 12/12/19 0215 12/12/19 0323             Family Communication/Anticipated D/C date and plan/Code Status   DVT prophylaxis: enoxaparin (LOVENOX) injection 40 mg Start: 12/13/19 1000     Code Status: Full Code  Family Communication: None Disposition Plan:    Status is: Inpatient  Remains inpatient appropriate because:IV treatments appropriate due to intensity of illness or inability to take PO   Dispo: The patient is from: Home              Anticipated d/c is to: Home              Anticipated d/c date is: 1 day              Patient currently is not medically stable to d/c.           Subjective:   Interval events noted.  Patient is unable to  provide any history because of confusion.  Objective:    Vitals:   12/13/19 2315 12/14/19 0339 12/14/19 0758 12/14/19 1158  BP: 130/63 (!) 128/59 (!) 111/98 98/68  Pulse: 80 74 77 68  Resp: 16 16 16 15   Temp: 99 F (37.2 C) 98.4 F (36.9 C) 98.5 F (36.9 C) 98.2 F (36.8 C)  TempSrc: Oral Oral    SpO2: 100% 99% 100% 99%  Weight:      Height:       No data found.   Intake/Output Summary (Last 24 hours) at 12/14/2019 1230 Last data filed at 12/14/2019 1021 Gross per 24 hour  Intake 120 ml  Output 300 ml  Net -180 ml   Filed Weights   12/11/19 1511  Weight: 90.7 kg    Exam:  GEN: NAD SKIN: Multiple chronic wounds on b/l legs EYES: No pallor or icterus ENT: MMM CV: RRR PULM: CTA B ABD: soft, obese, NT, +BS CNS: Alert, non focal EXT: B/l leg swelling with erythema and tenderness is improving     Data Reviewed:   I have personally reviewed following labs and imaging studies:  Labs: Labs show the following:   Basic Metabolic Panel: Recent Labs  Lab 12/11/19 1511 12/13/19 0756 12/14/19 0450  NA 133* 138 138  K 5.4* 4.6 4.6  CL 104 109 109  CO2 23 22 23   GLUCOSE 167* 111* 118*  BUN 70* 51* 43*  CREATININE 2.25* 1.86* 1.77*  CALCIUM 9.6 9.3 9.2   GFR Estimated Creatinine Clearance: 30 mL/min (A) (by C-G formula based on SCr of 1.77 mg/dL (H)). Liver Function Tests: Recent Labs  Lab 12/11/19 1511  AST 11*  ALT 7  ALKPHOS 79  BILITOT 0.4  PROT 9.5*  ALBUMIN 2.8*   No results for input(s): LIPASE, AMYLASE in the last 168 hours. No results for input(s): AMMONIA in the last 168 hours. Coagulation profile No results for input(s): INR, PROTIME in the last 168 hours.  CBC: Recent Labs  Lab 12/11/19 1511 12/13/19 0756 12/14/19 0450  WBC 7.1 5.8 5.8  NEUTROABS 4.5 3.7 3.1  HGB 8.9* 8.0* 8.1*  HCT 28.4* 26.1* 26.5*  MCV 90.4 90.9 91.4  PLT 310 249 253   Cardiac Enzymes: No results for input(s): CKTOTAL, CKMB, CKMBINDEX, TROPONINI in  the last 168 hours. BNP (last 3 results) No results for input(s): PROBNP in the last 8760 hours. CBG: Recent Labs  Lab 12/13/19 1213 12/13/19 1641 12/13/19 2135 12/14/19 0800 12/14/19 1158  GLUCAP 148*  162* 115* 108* 165*   D-Dimer: No results for input(s): DDIMER in the last 72 hours. Hgb A1c: Recent Labs    12/11/19 1511  HGBA1C 7.4*   Lipid Profile: No results for input(s): CHOL, HDL, LDLCALC, TRIG, CHOLHDL, LDLDIRECT in the last 72 hours. Thyroid function studies: No results for input(s): TSH, T4TOTAL, T3FREE, THYROIDAB in the last 72 hours.  Invalid input(s): FREET3 Anemia work up: No results for input(s): VITAMINB12, FOLATE, FERRITIN, TIBC, IRON, RETICCTPCT in the last 72 hours. Sepsis Labs: Recent Labs  Lab 12/11/19 1511 12/12/19 0222 12/13/19 0756 12/14/19 0450  WBC 7.1  --  5.8 5.8  LATICACIDVEN  --  1.5  --   --     Microbiology Recent Results (from the past 240 hour(s))  Culture, blood (routine x 2)     Status: None (Preliminary result)   Collection Time: 12/12/19  2:22 AM   Specimen: BLOOD  Result Value Ref Range Status   Specimen Description BLOOD RIGHT HAND  Final   Special Requests   Final    BOTTLES DRAWN AEROBIC AND ANAEROBIC Blood Culture results may not be optimal due to an inadequate volume of blood received in culture bottles   Culture   Final    NO GROWTH 2 DAYS Performed at [facility], [address], [place], [place] [address]    Report Status PENDING  Incomplete  Resp Panel by RT-PCR (Flu A&B, Covid) Nasopharyngeal Swab     Status: None   Collection Time: 12/12/19  2:22 AM   Specimen: Nasopharyngeal Swab; Nasopharyngeal(NP) swabs in vial transport medium  Result Value Ref Range Status   SARS Coronavirus 2 by RT PCR NEGATIVE NEGATIVE Final    Comment: (NOTE) SARS-CoV-2 target nucleic acids are NOT DETECTED.  The SARS-CoV-2 RNA is generally detectable in upper respiratory specimens during the acute phase of infection.  The lowest concentration of SARS-CoV-2 viral copies this assay can detect is 138 copies/mL. A negative result does not preclude SARS-Cov-2 infection and should not be used as the sole basis for treatment or other patient management decisions. A negative result may occur with  improper specimen collection/handling, submission of specimen other than nasopharyngeal swab, presence of viral mutation(s) within the areas targeted by this assay, and inadequate number of viral copies(<138 copies/mL). A negative result must be combined with clinical observations, patient history, and epidemiological information. The expected result is Negative.  Fact Sheet for Patients:  EntrepreneurPulse.com.au  Fact Sheet for Healthcare Providers:  IncredibleEmployment.be  This test is no t yet approved or cleared by the [place] FDA and  has been authorized for detection and/or diagnosis of SARS-CoV-2 by FDA under an Emergency Use Authorization (EUA). This EUA will remain  in effect (meaning this test can be used) for the duration of the COVID-19 declaration under Section 564(b)(1) of the Act, 21 U.S.C.section 360bbb-3(b)(1), unless the authorization is terminated  or revoked sooner.       Influenza A by PCR NEGATIVE NEGATIVE Final   Influenza B by PCR NEGATIVE NEGATIVE Final    Comment: (NOTE) The Xpert Xpress SARS-CoV-2/FLU/RSV plus assay is intended as an aid in the diagnosis of influenza from Nasopharyngeal swab specimens and should not be used as a sole basis for treatment. Nasal washings and aspirates are unacceptable for Xpert Xpress SARS-CoV-2/FLU/RSV testing.  Fact Sheet for Patients: EntrepreneurPulse.com.au  Fact Sheet for Healthcare Providers: IncredibleEmployment.be  This test is not yet approved or cleared by the [place] and has been authorized for  detection and/or diagnosis of SARS-CoV-2 by FDA  under an Emergency Use Authorization (EUA). This EUA will remain in effect (meaning this test can be used) for the duration of the COVID-19 declaration under Section 564(b)(1) of the Act, 21 U.S.C. section 360bbb-3(b)(1), unless the authorization is terminated or revoked.  Performed at [facility], [address] [place], [place] [address]     Procedures and diagnostic studies:  No results found.             LOS: 2 days   Charline Hoskinson  [facility]   Pager on [email or web address]. If 7PM-7AM, please contact night-coverage at [email or web address]     12/14/2019, 12:30 PM

## 2019-12-15 ENCOUNTER — Ambulatory Visit: Payer: Medicare Other | Admitting: Urology

## 2019-12-15 LAB — GLUCOSE, CAPILLARY
Glucose-Capillary: 107 mg/dL — ABNORMAL HIGH (ref 70–99)
Glucose-Capillary: 151 mg/dL — ABNORMAL HIGH (ref 70–99)
Glucose-Capillary: 118 mg/dL — ABNORMAL HIGH (ref 70–99)

## 2019-12-15 MED ORDER — COLLAGENASE 250 UNIT/GM EX OINT: TOPICAL_OINTMENT | Freq: Every day | CUTANEOUS | 0 refills | Status: DC

## 2019-12-15 MED ORDER — CEPHALEXIN 500 MG PO CAPS: 500.0000 mg | ORAL_CAPSULE | Freq: Two times a day (BID) | ORAL | Status: DC

## 2019-12-15 MED ORDER — CEPHALEXIN 500 MG PO CAPS
500.0000 mg | ORAL_CAPSULE | Freq: Two times a day (BID) | ORAL | Status: DC
  Administered 2019-12-15: 10:00:00 500 mg via ORAL
  Filled 2019-12-15: qty 1

## 2019-12-15 MED ORDER — CEPHALEXIN 500 MG PO CAPS: 500.0000 mg | ORAL_CAPSULE | Freq: Two times a day (BID) | ORAL | 0 refills | Status: AC

## 2019-12-15 NOTE — Plan of Care (Signed)
°  Problem: Education: Goal: Knowledge of General Education information will improve Description: Including pain rating scale, medication(s)/side effects and non-pharmacologic comfort measures Outcome: Progressing   Problem: Health Behavior/Discharge Planning: Goal: Ability to manage health-related needs will improve Outcome: Progressing   Problem: Clinical Measurements: Goal: Ability to maintain clinical measurements within normal limits will improve Outcome: Progressing Goal: Will remain free from infection Outcome: Progressing Goal: Diagnostic test results will improve Outcome: Progressing Goal: Respiratory complications will improve Outcome: Progressing Goal: Cardiovascular complication will be avoided Outcome: Progressing   Problem: Activity: Goal: Risk for activity intolerance will decrease Outcome: Progressing   Problem: Nutrition: Goal: Adequate nutrition will be maintained Outcome: Progressing   Problem: Coping: Goal: Level of anxiety will decrease Outcome: Progressing   Problem: Elimination: Goal: Will not experience complications related to bowel motility Outcome: Progressing Goal: Will not experience complications related to urinary retention Outcome: Progressing   Problem: Pain Managment: Goal: General experience of comfort will improve Outcome: Progressing   Problem: Safety: Goal: Ability to remain free from injury will improve Outcome: Progressing   Problem: Skin Integrity: Goal: Risk for impaired skin integrity will decrease Outcome: Progressing   Problem: Education: Goal: Ability to describe self-care measures that may prevent or decrease complications (Diabetes Survival Skills Education) will improve Outcome: Progressing Goal: Individualized Educational Video(s) Outcome: Progressing   Problem: Coping: Goal: Ability to adjust to condition or change in health will improve Outcome: Progressing   Problem: Fluid Volume: Goal: Ability to  maintain a balanced intake and output will improve Outcome: Progressing   Problem: Health Behavior/Discharge Planning: Goal: Ability to identify and utilize available resources and services will improve Outcome: Progressing Goal: Ability to manage health-related needs will improve Outcome: Progressing   Problem: Metabolic: Goal: Ability to maintain appropriate glucose levels will improve Outcome: Progressing   Problem: Nutritional: Goal: Maintenance of adequate nutrition will improve Outcome: Progressing Goal: Progress toward achieving an optimal weight will improve Outcome: Progressing   Problem: Skin Integrity: Goal: Risk for impaired skin integrity will decrease Outcome: Progressing   Problem: Tissue Perfusion: Goal: Adequacy of tissue perfusion will improve Outcome: Progressing   Problem: Clinical Measurements: Goal: Ability to avoid or minimize complications of infection will improve Outcome: Progressing   Problem: Skin Integrity: Goal: Skin integrity will improve Outcome: Progressing

## 2019-12-15 NOTE — Evaluation (Signed)
Physical Therapy Evaluation Patient Details Name: [name] MRN: [id number] DOB: May 29, 1945 Today's Date: 12/15/2019   History of Present Illness  [name] is a 74 y.o. female with medical history significant for CAD, type II DM, aortic stenosis, chronic diastolic CHF, hypertension, CKD stage IV, cognitive impairment with suspected underlying dementia, chronic venous insufficiency with chronic wounds on bilateral legs, who was sent from the wound care clinic to the emergency room for evaluation of bilateral lower extremity cellulitis.  Clinical Impression  Patient received in bed, wants to get up to use bathroom. Patient's husband present to assist. He uses B hand held assist to get her standing, +2 min assist to pivot to bsc then to recliner. Patient appears to be at baseline level of functioning, husband agrees. She will continue to benefit from skilled PT while here to maintain current functional status for safe return home.     Follow Up Recommendations Home health PT    Equipment Recommendations  Wheelchair (measurements PT);Wheelchair cushion (measurements PT)    Recommendations for Other Services       Precautions / Restrictions Precautions Precautions: Fall Restrictions Weight Bearing Restrictions: No      Mobility  Bed Mobility Overal bed mobility: Needs Assistance Bed Mobility: Supine to Sit     Supine to sit: Min assist;HOB elevated          Transfers Overall transfer level: Needs assistance Equipment used: 2 person hand held assist Transfers: Sit to/from [facility] Sit to Stand: Min assist;+2 physical assistance Stand pivot transfers: Min assist;+2 physical assistance          Ambulation/Gait             General Gait Details: non ambulatory at baseline  Stairs            Wheelchair Mobility    Modified Rankin (Stroke Patients Only)       Balance Overall balance assessment: Needs assistance Sitting-balance  support: Feet supported Sitting balance-Leahy Scale: Good     Standing balance support: Bilateral upper extremity supported;During functional activity Standing balance-Leahy Scale: Poor Standing balance comment: heavy assist needed with B UE support and cues for moving feet                             Pertinent Vitals/Pain Pain Assessment: No/denies pain    Home Living Family/patient expects to be discharged to:: Private residence Living Arrangements: Spouse/significant other Available Help at Discharge: Family;Available 24 hours/day Type of Home: House Home Access: Ramped entrance     Home Layout: One level Home Equipment: Wheelchair - manual Additional Comments: patient's husband bought wheelchair at [employer]. Says it is in bad shape.    Prior Function Level of Independence: Needs assistance   Gait / Transfers Assistance Needed: husband assists c SPT recliner<>w/c  ADL's / Homemaking Assistance Needed: husband assists c LBD and reports pt does not bathe often (sponge bathes herself in sitting)        Hand Dominance        Extremity/Trunk Assessment   Upper Extremity Assessment Upper Extremity Assessment: Generalized weakness    Lower Extremity Assessment Lower Extremity Assessment: Generalized weakness    Cervical / Trunk Assessment Cervical / Trunk Assessment: Normal  Communication   Communication: HOH  Cognition Arousal/Alertness: Awake/alert Behavior During Therapy: Flat affect Overall Cognitive Status: History of cognitive impairments - at baseline  General Comments: Pt requires cues t/o      General Comments      Exercises     Assessment/Plan    PT Assessment Patient needs continued PT services  PT Problem List Decreased strength;Decreased mobility;Decreased activity tolerance;Decreased balance;Decreased cognition;Decreased safety awareness       PT Treatment Interventions  Therapeutic activities;Therapeutic exercise;Functional mobility training;Patient/family education    PT Goals (Current goals can be found in the Care Plan section)  Acute Rehab PT Goals Patient Stated Goal: To return home PT Goal Formulation: With patient/family Time For Goal Achievement: 12/22/19 Potential to Achieve Goals: Good    Frequency Min 2X/week   Barriers to discharge        Co-evaluation               AM-PAC PT "6 Clicks" Mobility  Outcome Measure Help needed turning from your back to your side while in a flat bed without using bedrails?: A Lot Help needed moving from lying on your back to sitting on the side of a flat bed without using bedrails?: A Lot Help needed moving to and from a bed to a chair (including a wheelchair)?: A Lot Help needed standing up from a chair using your arms (e.g., wheelchair or bedside chair)?: A Lot Help needed to walk in hospital room?: Total Help needed climbing 3-5 steps with a railing? : Total 6 Click Score: 10    End of Session   Activity Tolerance: Patient tolerated treatment well Patient left: in chair;with call bell/phone within reach;with chair alarm set;with family/visitor present Nurse Communication: Mobility status PT Visit Diagnosis: Muscle weakness (generalized) (M62.81);Other abnormalities of gait and mobility (R26.89)    Time: 8828-0034 PT Time Calculation (min) (ACUTE ONLY): 30 min   Charges:   PT Evaluation $PT Eval Moderate Complexity: 1 Mod PT Treatments $Therapeutic Activity: 8-22 mins        Terissa Haffey, PT, GCS 12/15/19,12:35 PM

## 2019-12-15 NOTE — Care Management Important Message (Signed)
Important Message  Patient Details  Name: MCKINNLEY COTTIER MRN: [id number] Date of Birth: 1945/01/22   Medicare Important Message Given:  Yes     [name] A Kimon Loewen 12/15/2019, 10:47 AM

## 2019-12-15 NOTE — Discharge Summary (Signed)
Physician Discharge Summary  [name]:[id number] DOB: [birth year] DOA: 12/12/2019  PCP: Birdie Sons, MD  Admit date: 12/12/2019 Discharge date: 12/15/2019  Discharge disposition: Home with home health therapy   Recommendations for Outpatient Follow-Up:   Follow-up with PCP in 1 week Follow-up with the wound care clinic in 1 to 2 weeks   Discharge Diagnosis:   Principal Problem:   Bilateral lower leg cellulitis Active Problems:   Type 2 diabetes mellitus with stage 4 chronic kidney disease (HCC)   Essential hypertension   Nonrheumatic aortic valve stenosis   Chronic anemia   Nonischemic cardiomyopathy (HCC)   CAD in native artery   Chronic venous insufficiency   Chronic kidney disease, stage 3 (HCC)   Chronic HFrEF (heart failure with reduced ejection fraction) (HCC)   CKD (chronic kidney disease), stage IV ([place])    Discharge Condition: Stable.  Diet recommendation:  Diet Order            Diet - low sodium heart healthy           Diet heart healthy/carb modified Room service appropriate? Yes; Fluid consistency: Thin  Diet effective now                   Code Status: Full Code     Hospital Course:    [name] is a 74 y.o. female with medical history significant for CAD, type II DM, aortic stenosis, chronic diastolic CHF, hypertension, CKD stage IV, cognitive impairment with suspected underlying dementia, chronic venous insufficiency with chronic wounds on bilateral legs, who was sent from the wound care clinic to the emergency room for evaluation of bilateral lower extremity cellulitis.  She was admitted to [facility] and treated with IV Ancef and IV fluids.  She was evaluated by the wound care nurse and she received local wound care.  She was evaluated by PT and OT who recommended home health therapy.  Her condition is improved and she is deemed stable for discharge to home today.  She will be discharged on Keflex to complete treatment  for cellulitis of the lower extremities.       Discharge Exam:    Vitals:   12/14/19 2339 12/15/19 0521 12/15/19 0757 12/15/19 1128  BP: (!) 138/58 125/67 (!) 118/56 (!) 103/50  Pulse: 86 80 77 66  Resp: 16 16 18 18   Temp: 99 F (37.2 C) 98.4 F (36.9 C) 98.9 F (37.2 C) 97.8 F (36.6 C)  TempSrc: Oral     SpO2: 99% 99% 96% 97%  Weight:      Height:         GEN: NAD SKIN: Multiple chronic wounds on bilateral legs EYES: No pallor or icterus. ENT: MMM CV: RRR PULM: CTA B ABD: soft, obese, NT, +BS CNS: AAO x 3, non focal EXT: Bilateral leg swelling with erythema and tenderness has improved   The results of significant diagnostics from this hospitalization (including imaging, microbiology, ancillary and laboratory) are listed below for reference.     Procedures and Diagnostic Studies:   No results found.   Labs:   Basic Metabolic Panel: Recent Labs  Lab 12/11/19 1511 12/13/19 0756 12/14/19 0450  NA 133* 138 138  K 5.4* 4.6 4.6  CL 104 109 109  CO2 23 22 23   GLUCOSE 167* 111* 118*  BUN 70* 51* 43*  CREATININE 2.25* 1.86* 1.77*  CALCIUM 9.6 9.3 9.2   GFR Estimated Creatinine Clearance: 30 mL/min (A) (by C-G  formula based on SCr of 1.77 mg/dL (H)). Liver Function Tests: Recent Labs  Lab 12/11/19 1511  AST 11*  ALT 7  ALKPHOS 79  BILITOT 0.4  PROT 9.5*  ALBUMIN 2.8*   No results for input(s): LIPASE, AMYLASE in the last 168 hours. No results for input(s): AMMONIA in the last 168 hours. Coagulation profile No results for input(s): INR, PROTIME in the last 168 hours.  CBC: Recent Labs  Lab 12/11/19 1511 12/13/19 0756 12/14/19 0450  WBC 7.1 5.8 5.8  NEUTROABS 4.5 3.7 3.1  HGB 8.9* 8.0* 8.1*  HCT 28.4* 26.1* 26.5*  MCV 90.4 90.9 91.4  PLT 310 249 253   Cardiac Enzymes: No results for input(s): CKTOTAL, CKMB, CKMBINDEX, TROPONINI in the last 168 hours. BNP: Invalid input(s): POCBNP CBG: Recent Labs  Lab 12/14/19 1734  12/14/19 2026 12/15/19 0759 12/15/19 1131 12/15/19 1631  GLUCAP 186* 122* 107* 151* 118*   D-Dimer No results for input(s): DDIMER in the last 72 hours. Hgb A1c No results for input(s): HGBA1C in the last 72 hours. Lipid Profile No results for input(s): CHOL, HDL, LDLCALC, TRIG, CHOLHDL, LDLDIRECT in the last 72 hours. Thyroid function studies No results for input(s): TSH, T4TOTAL, T3FREE, THYROIDAB in the last 72 hours.  Invalid input(s): FREET3 Anemia work up No results for input(s): VITAMINB12, FOLATE, FERRITIN, TIBC, IRON, RETICCTPCT in the last 72 hours. Microbiology Recent Results (from the past 240 hour(s))  Culture, blood (routine x 2)     Status: None (Preliminary result)   Collection Time: 12/12/19  2:22 AM   Specimen: BLOOD  Result Value Ref Range Status   Specimen Description BLOOD RIGHT HAND  Final   Special Requests   Final    BOTTLES DRAWN AEROBIC AND ANAEROBIC Blood Culture results may not be optimal due to an inadequate volume of blood received in culture bottles   Culture   Final    NO GROWTH 3 DAYS Performed at [facility], [address] [place], [place] [address]    Report Status PENDING  Incomplete  Resp Panel by RT-PCR (Flu A&B, Covid) Nasopharyngeal Swab     Status: None   Collection Time: 12/12/19  2:22 AM   Specimen: Nasopharyngeal Swab; Nasopharyngeal(NP) swabs in vial transport medium  Result Value Ref Range Status   SARS Coronavirus 2 by RT PCR NEGATIVE NEGATIVE Final    Comment: (NOTE) SARS-CoV-2 target nucleic acids are NOT DETECTED.  The SARS-CoV-2 RNA is generally detectable in upper respiratory specimens during the acute phase of infection. The lowest concentration of SARS-CoV-2 viral copies this assay can detect is 138 copies/mL. A negative result does not preclude SARS-Cov-2 infection and should not be used as the sole basis for treatment or other patient management decisions. A negative result may occur with  improper  specimen collection/handling, submission of specimen other than nasopharyngeal swab, presence of viral mutation(s) within the areas targeted by this assay, and inadequate number of viral copies(<138 copies/mL). A negative result must be combined with clinical observations, patient history, and epidemiological information. The expected result is Negative.  Fact Sheet for Patients:  EntrepreneurPulse.com.au  Fact Sheet for Healthcare Providers:  IncredibleEmployment.be  This test is no t yet approved or cleared by the [place] FDA and  has been authorized for detection and/or diagnosis of SARS-CoV-2 by FDA under an Emergency Use Authorization (EUA). This EUA will remain  in effect (meaning this test can be used) for the duration of the COVID-19 declaration under Section 564(b)(1) of the  Act, 21 U.S.C.section 360bbb-3(b)(1), unless the authorization is terminated  or revoked sooner.       Influenza A by PCR NEGATIVE NEGATIVE Final   Influenza B by PCR NEGATIVE NEGATIVE Final    Comment: (NOTE) The Xpert Xpress SARS-CoV-2/FLU/RSV plus assay is intended as an aid in the diagnosis of influenza from Nasopharyngeal swab specimens and should not be used as a sole basis for treatment. Nasal washings and aspirates are unacceptable for Xpert Xpress SARS-CoV-2/FLU/RSV testing.  Fact Sheet for Patients: EntrepreneurPulse.com.au  Fact Sheet for Healthcare Providers: IncredibleEmployment.be  This test is not yet approved or cleared by the [place] FDA and has been authorized for detection and/or diagnosis of SARS-CoV-2 by FDA under an Emergency Use Authorization (EUA). This EUA will remain in effect (meaning this test can be used) for the duration of the COVID-19 declaration under Section 564(b)(1) of the Act, 21 U.S.C. section 360bbb-3(b)(1), unless the authorization is terminated or revoked.  Performed at  [facility], [address] [place], [place] [address]      Discharge Instructions:   Discharge Instructions    Diet - low sodium heart healthy   Complete by: As directed    Discharge instructions   Complete by: As directed    Lisinopril was discontinued at discharge because blood pressure has been on the low side.  Okay to continue amlodipine and metoprolol for now.  Make appointment with PCP within 1 week of discharge for further instructions regarding blood pressure medicines   Discharge wound care:   Complete by: As directed    Cleanse legs with soap and water and pat dry.  Apply mupirocin ointment to scabbed lesions.  Cover with xeroform gauze and wrap with kerlix/tape. Change daily.   Face-to-face encounter (required for Medicare/Medicaid patients)   Complete by: As directed    I Dellia Donnelly certify that this patient is under my care and that I, or a nurse practitioner or physician's assistant working with me, had a face-to-face encounter that meets the physician face-to-face encounter requirements with this patient on 12/15/2019. The encounter with the patient was in whole, or in part for the following medical condition(s) which is the primary reason for home health care (List medical condition): Debility, leg wounds   The encounter with the patient was in whole, or in part, for the following medical condition, which is the primary reason for home health care: leg wounds, debility   I certify that, based on my findings, the following services are medically necessary home health services:  Nursing Physical therapy     Reason for Medically Necessary Home Health Services:  Skilled Nursing- Complex Wound Care Therapy- Gait Training, Transfer Training and Stair Training     My clinical findings support the need for the above services: Unable to leave home safely without assistance and/or assistive device   Further, I certify that my clinical findings support that this  patient is homebound due to: Unsafe ambulation due to balance issues   Home Health   Complete by: As directed    Wound care: Cleanse legs with soap and water and pat dry.  Apply santyl ointment to scabbed lesions.  Cover with xeroform gauze and wrap with kerlix/tape. Change daily.   To provide the following care/treatments:  PT OT RN     Increase activity slowly   Complete by: As directed      Allergies as of 12/15/2019      Reactions   Hydrochlorothiazide Other (See Comments)  Leg Cramps   Penicillins Rash      Medication List    STOP taking these medications   lisinopril 20 MG tablet Commonly known as: ZESTRIL     TAKE these medications   albuterol 108 (90 Base) MCG/ACT inhaler Commonly known as: VENTOLIN HFA INHALE 1-2 PUFFS INTO THE LUNGS EVERY 6 (SIX) HOURS AS NEEDED FOR SHORTNESS OF BREATH.   amLODipine 2.5 MG tablet Commonly known as: NORVASC Take 1 tablet (2.5 mg total) by mouth daily.   cephALEXin 500 MG capsule Commonly known as: KEFLEX Take 1 capsule (500 mg total) by mouth 2 (two) times daily for 3 days.   collagenase ointment Commonly known as: SANTYL Apply topically daily. Cleanse legs with soap and water and pat dry.  Apply Santyl ointment to scabbed lesions.  Cover with xeroform gauze and wrap with kerlix/tape. Change daily. Start taking on: December 16, 2019   levalbuterol 1.25 MG/3ML nebulizer solution Commonly known as: XOPENEX Take 1.25 mg by nebulization every 8 (eight) hours as needed for wheezing.   metoprolol tartrate 50 MG tablet Commonly known as: LOPRESSOR Take 1.5 tablets (75 mg total) by mouth 2 (two) times daily.   oxybutynin 5 MG tablet Commonly known as: DITROPAN Take 1 tablet (5 mg total) by mouth 2 (two) times daily.   vitamin B-12 500 MCG tablet Commonly known as: CYANOCOBALAMIN Take 250 mcg by mouth daily.   Vitamin D 50 MCG (2000 UT) tablet Take 2,000 Units by mouth daily.            Discharge Care Instructions   (From admission, onward)         Start     Ordered   12/15/19 0000  Discharge wound care:       Comments: Cleanse legs with soap and water and pat dry.  Apply mupirocin ointment to scabbed lesions.  Cover with xeroform gauze and wrap with kerlix/tape. Change daily.   12/15/19 1638          Follow-up Information    [facility]. Schedule an appointment as soon as possible for a visit in 1 week(s).   Specialty: Wound Care Contact information: [address] [phone] ar [phone]               Time coordinating discharge: 32 minutes  Signed:  Jennye Boroughs  [facility] 12/15/2019, 5:01 PM   Pager on [email or web address]. If 7PM-7AM, please contact night-coverage at [email or web address]

## 2019-12-16 ENCOUNTER — Telehealth: Payer: Self-pay

## 2019-12-16 ENCOUNTER — Telehealth: Payer: Self-pay | Admitting: *Deleted

## 2019-12-16 DIAGNOSIS — L03116 Cellulitis of left lower limb: Secondary | ICD-10-CM

## 2019-12-16 DIAGNOSIS — Z6841 Body Mass Index (BMI) 40.0 and over, adult: Secondary | ICD-10-CM

## 2019-12-16 DIAGNOSIS — L03115 Cellulitis of right lower limb: Secondary | ICD-10-CM

## 2019-12-16 DIAGNOSIS — R609 Edema, unspecified: Secondary | ICD-10-CM

## 2019-12-16 NOTE — Telephone Encounter (Signed)
Transition Care Management Follow-up Telephone Call  Date of discharge and from where: [facility] on 12/15/19  How have you been since you were released from the hospital? Per husband pt is doing fine. She has been disoriented some but her legs look much better. Per husband legs are not red, swollen, or oozing anymore. Currently both legs are wrapped and husband has not changed the wraps yet. He plans to do this today after he picks up the Santly to apply to legs. Denies fever, chills, pain or n/v/d.  Any questions or concerns? Pt is needing a new wheelchair. Referral placed today.  Items Reviewed:  Did the pt receive and understand the discharge instructions provided? Yes   Medications obtained and verified? Yes   Any new allergies since your discharge? No   Dietary orders reviewed? Yes  Do you have support at home? Yes   Other (ie: DME, Home Health, etc): HH was ordered for PT, OT and an Therapist, sports.  Functional Questionnaire: (I = Independent and D = Dependent)  Bathing/Dressing- Needs assistance from husband.   Meal Prep- D, husband manages.  Eating- I  Maintaining continence- I, wears pads at all times.  Transferring/Ambulation- Uses a walker at all times.  Managing Meds- D, husband manages.   Follow up appointments reviewed:    PCP Hospital f/u appt confirmed? No  , husband would like to wait to schedule a HFU at this time.  [facility] f/u appt confirmed? No, husband to call and schedule apt with wound clinic.   Are transportation arrangements needed? No   If their condition worsens, is the pt aware to call  their PCP or go to the ED? Yes  Was the patient provided with contact information for the PCP's office or ED? Yes  Was the pt encouraged to call back with questions or concerns? Yes

## 2019-12-16 NOTE — Telephone Encounter (Signed)
Referral needed for a wheel chair.

## 2019-12-16 NOTE — Telephone Encounter (Signed)
No HFU scheduled at this time. 

## 2019-12-16 NOTE — Chronic Care Management (AMB) (Signed)
  Chronic Care Management   Note  12/16/2019 Name: [name] MRN: [id number] DOB: 1945-10-11  [name] is a 74 y.o. year old female who is a primary care patient of Caryn Section, Kirstie Peri, MD. I reached out to [name] by phone today in response to a referral sent by [name] patient's AWV (annual wellness visit) nurse, Hassan Buckler, [place], LPN. [name] was given information about Chronic Care Management services today including:  1. CCM service includes personalized support from designated clinical staff supervised by her physician, including individualized plan of care and coordination with other care providers 2. 24/7 contact phone numbers for assistance for urgent and routine care needs. 3. Service will only be billed when office clinical staff spend 20 minutes or more in a month to coordinate care. 4. Only one practitioner may furnish and bill the service in a calendar month. 5. The patient may stop CCM services at any time (effective at the end of the month) by phone call to the office staff. 6. The patient will be responsible for cost sharing (co-pay) of up to 20% of the service fee (after annual deductible is met).  Patient agreed to services and verbal consent obtained.   Follow up plan: Telephone appointment with care management team member scheduled for: 01/01/2020  [place] Management  Direct Dial: [phone]

## 2019-12-17 LAB — CULTURE, BLOOD (ROUTINE X 2): Culture: NO GROWTH

## 2019-12-19 ENCOUNTER — Telehealth: Payer: Self-pay

## 2019-12-19 NOTE — Telephone Encounter (Signed)
    MA12/17/2021 1st Attempt  Name: [name]   MRN: [id number]   DOB: [birth year]   AGE: 74 y.o.   GENDER: female   PCP Birdie Sons, MD.   Referral Reason: DME Request Wheelchair  Interventions: Successful outbound call placed to the patient Sent medical supply information to [facility].  Let patient's spouse know information had been given to the nurse.   Follow up plan: Care guide will follow up with patient by phone over the next 3days and will let patient's spouse know when an order is sent to the medical supply company for the wheelchair.    Cross Jorge, AAS Paralegal, [place] . Embedded Care Coordination [facility] Health  Care Management  [address],  [address] [email or web address]  [phone]   [email or web address]

## 2019-12-22 NOTE — Telephone Encounter (Signed)
Referral placed for a wheelchair.   Per Careguide:  [employer] [phone], fax [phone], [address] [place] [address], [place] does accept Medicare and Medicaid pay assignments   [place] Medicare Supplier/Brent Loyce Dys sales person direct number [phone], fax number [phone] , [place], [place] [address], He needs a physician or nurse practitioner to send him a prescription, demographic info and copy of the patient's insurance card. Medicare enrolled and may accept medicare assignment.

## 2019-12-22 NOTE — Telephone Encounter (Signed)
Dr. Caryn Section is out of the office until 12/29/2019. Is patient ok with waiting until he returns for their request to be reviewed?

## 2019-12-23 ENCOUNTER — Telehealth: Payer: Self-pay

## 2019-12-23 NOTE — Telephone Encounter (Signed)
    MA12/21/2021  Name: NEYDA DURANGO   MRN: [id number]   DOB: 04/01/1945   AGE: 74 y.o.   GENDER: female   PCP Birdie Sons, MD.   Referral Reason: DME request for wheelchair  Interventions: Successful outbound call placed to the patient Follow up call placed to the patient to discuss status of referral  Follow up plan: No further follow up planned at this time. The patient has been provided with needed resources. and updated patient's husband on status of wheelchair referral. Let him know that Dr. Caryn Section will be out of the office until 12/27.  He will send RX once he returns.       Omer Monter, AAS Paralegal, [place] . Embedded Care Coordination [facility] Health  Care Management  [address], [place] [address] [email or web address]  [phone]   [email or web address]

## 2019-12-23 NOTE — Telephone Encounter (Signed)
Yes, that is fine. Pt has another AD to use until then. Thanks!

## 2019-12-23 NOTE — Telephone Encounter (Signed)
Dr Caryn Section is out of the office until 12/29/19. Will send prescription once he return and prepares Rx. FYI!

## 2020-01-01 ENCOUNTER — Ambulatory Visit: Payer: Medicare Other

## 2020-01-01 NOTE — Chronic Care Management (AMB) (Signed)
  Chronic Care Management   Note  01/01/2020 Name: [name] MRN: [id number] DOB: [birth year]   Mrs. [name] was referred to the Chronic Care Management team for assistance with case management and care coordination. Successful outreach with her caregiver/spouse [name] today. He reports that [name] will not require ongoing care management services. Indicated the primary concern was obtaining a wheelchair. A referral for assistance with medical equipment was previously submitted.  Mr. [name] declined need for additional referrals. Declined need for assistance with meals or transportation. Declined current need for additional assistance in the home. He agreed to call if his wife's care needs change and care management outreach is required.   PLAN [name] will call if [name]'s health status changes and additional outreach is needed. The care management team will gladly assist.    [facility] [phone]

## 2020-01-06 ENCOUNTER — Other Ambulatory Visit: Payer: Self-pay

## 2020-01-06 ENCOUNTER — Encounter: Payer: Medicare Other | Attending: Physician Assistant | Admitting: Physician Assistant

## 2020-01-06 DIAGNOSIS — I428 Other cardiomyopathies: Secondary | ICD-10-CM | POA: Diagnosis not present

## 2020-01-06 DIAGNOSIS — I272 Pulmonary hypertension, unspecified: Secondary | ICD-10-CM | POA: Insufficient documentation

## 2020-01-06 DIAGNOSIS — I2510 Atherosclerotic heart disease of native coronary artery without angina pectoris: Secondary | ICD-10-CM | POA: Diagnosis not present

## 2020-01-06 DIAGNOSIS — I872 Venous insufficiency (chronic) (peripheral): Secondary | ICD-10-CM | POA: Diagnosis not present

## 2020-01-06 DIAGNOSIS — L03116 Cellulitis of left lower limb: Secondary | ICD-10-CM | POA: Diagnosis not present

## 2020-01-06 DIAGNOSIS — E119 Type 2 diabetes mellitus without complications: Secondary | ICD-10-CM | POA: Insufficient documentation

## 2020-01-06 DIAGNOSIS — L03115 Cellulitis of right lower limb: Secondary | ICD-10-CM | POA: Diagnosis not present

## 2020-01-06 DIAGNOSIS — I110 Hypertensive heart disease with heart failure: Secondary | ICD-10-CM | POA: Insufficient documentation

## 2020-01-06 DIAGNOSIS — I509 Heart failure, unspecified: Secondary | ICD-10-CM | POA: Diagnosis not present

## 2020-01-06 DIAGNOSIS — I890 Lymphedema, not elsewhere classified: Secondary | ICD-10-CM | POA: Diagnosis not present

## 2020-01-06 DIAGNOSIS — L97822 Non-pressure chronic ulcer of other part of left lower leg with fat layer exposed: Secondary | ICD-10-CM | POA: Diagnosis not present

## 2020-01-06 DIAGNOSIS — L97812 Non-pressure chronic ulcer of other part of right lower leg with fat layer exposed: Secondary | ICD-10-CM | POA: Diagnosis not present

## 2020-01-06 NOTE — Progress Notes (Addendum)
BRIYAH, WHEELWRIGHT ([id number]) Visit Report for 01/06/2020 Chief Complaint Document Details Patient Name: [name], [name]. Date of Service: 01/06/2020 11:00 AM Medical Record Number: [id number] Patient Account Number: [id number] Date of Birth/Sex: August 25, 1945 (75 y.o. F) Treating RN: Cornell Barman Primary Care Provider: Lelon Huh Other Clinician: Referring Provider: Lelon Huh Treating Provider/Extender: Skipper Cliche in Treatment: 3 Information Obtained from: Patient Chief Complaint Patient seen for complaints of Non-Healing Wounds to both lower extremities Electronic Signature(s) Signed: 01/06/2020 10:58:22 AM By: Worthy Keeler PA-C Entered By: Worthy Keeler on 01/06/2020 10:58:22 [name] ([id number]) -------------------------------------------------------------------------------- HPI Details Patient Name: [name] Date of Service: 01/06/2020 11:00 AM Medical Record Number: [id number] Patient Account Number: [id number] Date of Birth/Sex: 1945/11/04 (74 y.o. F) Treating RN: Cornell Barman Primary Care Provider: Lelon Huh Other Clinician: Referring Provider: Lelon Huh Treating Provider/Extender: Skipper Cliche in Treatment: 3 History of Present Illness Location: both lower extremity swelling with ulceration HPI Description: 75 year old patient who sees her PCP Dr. Lelon Huh was recently evaluated 10 days ago for diabetes mellitus, hypertension, CHF and hyperlipidemia. she also was noted to have ulcerations develop in her legs and she has been applying Silvadene dressings locally. In the past she has refused wound care referrals.her cardiologist Dr. Saunders Revel saw her and put her on 40 mg of furosemide daily. last hemoglobin A1c was 7.7%. she was also placed on ciprofloxacin twice daily for 7 days and a urine culture was recommended. past medical history significant for coronary artery disease, diabetes mellitus, nonischemic cardiomyopathy and pulmonary  hypertension. She is also status post heart catheterization and coronary angiography, tubal ligation and breast cyst removal in the past. She has never been a smoker. Patient had arterial studies done which showed bilateral ABIs are artificially elevated due to noncompressible and calcified vessels. Triphasic waveform throughout. Right great toe TBI is elevated while the left is normal. 10/23/2016 -- the patient is rather moribund from several issues including chronic back pain and knee pain and swelling of her legs. The large necrotic area on her left lateral anterior calf was bleeding on touch after washing her leg. There was a spot which needed silver nitrate cauterization and this was done appropriately. 11/06/2016 -- she is again noted to have friable bleeding from the left lower extremity wounds and this again had to be cauterized with silver nitrate to control the bleeding as pressure itself would not do it. 11/20/2016 -- the right lower extremity is completely healed and we have ordered 30-40 mm compression stocking's in both the dural layer and also a pair of juxta lites. 12/18/2016 -- she has not been wearing her compression stockings on her right leg and these have opened out into ulcerations again. Her left lower extremity has circumferential ulcerations now. I believe at this stage I would like to get her venous reflux studies done to make certain that there is no fixable superficial venous reflux. 12/29/2016 -- she has made an excellent recovery having continued with her appropriate doses of diuretics and elevation and exercise. She has not yet received her right lower extremity juxta lites. Her venous duplex study is at the end of January 01/13/16 on evaluation today patient's wounds appeared to be overall doing much better much less hyper granular than previous week's evaluation. The Hydrofera Blue Dressing's to be doing very well. She does tell me that she is having a little bit more  discomfort in the posterior aspect of her leg where she has a wound at this  point. Fortunately there appears to be no infection. 01/19/17 on evaluation today patient appears to be doing very well in regard to her bilateral lower extremity swelling and at this point in time her left lower extremity ulcers. Her wounds appear to be doing much better. She has been tolerating the dressing's at this point fortunately she did get the Juxta-Lite compression as of today as well. Nonetheless I am pleased that she has been tolerating everything so well and that her wounds looks so good. In fact she is an excellent granular surface no evidence of slough covering and I do not see any reason for likely debridement today. Especially in regard to the posterior leg. 01/26/17 on evaluation today patient appears to be doing decently well in regard to her wounds. The surface of the majority of her wounds is greatly improved. She has been using the Juxta-Lite compression which is excellent. Nonetheless the wound of the left posterior calf did require some debridement today otherwise the majority of the wounds did not require any debridement. The this was due to slough buildup on the surface. She also has a small skin flap where two of the ulcers actually connected and this has loosened up I'm concerned that things may not heal well with that flap but for the time being I'm not gonna do anything different in that regard. 02/23/17 on evaluation today patient's wound actually appears to be doing fairly well in regard to the remaining left lateral ulcer which we have been treated. With that being said it does appear that her wrap actually slid down the wood bit over the past few days at least that there's actually some friction injury and blistering noted on the medial aspect of her malleolus of the left lower extremity. She continues to use the Juxta- Lite for the right lower extremity. With that being said no debridement is  necessary today and these are very superficial injuries as far as the new injuries are concerned. 03/02/17 on evaluation today patient appears to be doing excellent in regard to her left lateral lower from the ulcers. In fact it appears that she is almost completely healed in regard to the ulcer we have been following and the new injuries which were caused by the wraps slipping down last week have been completely resolved which is excellent news. Overall I'm definitely pleased with the progress that she has made. Fortunately the rat did not cause any irritation as it did previously with the Unna wrap. 03/09/17 on evaluation today patient's wound appears to be completely healed. Obviously this is great news. She and her husband both are extremely pleased and excited to finally have this gone. Obviously she has been dealing with this wound for quite some time. Readmission: 04/14/2019 upon evaluation today patient appears for reevaluation here in our clinic although it has been since March 2019 since I last saw her. She unfortunately is having issues similar to what she had previous which are blisters and ulcerations of the bilateral lower extremities. Fortunately there is no evidence of active infection at this time based on what I am seeing. Unfortunately she is still having a lot of drainage and weeping. I do believe she needs to be compression wrap in order to get this under control and she is previously tolerated a 3 layer compression wrap without any complications whatsoever. No fevers, chills, nausea, vomiting, or diarrhea. DAY, GREB ([id number]) 04/28/2019 upon evaluation today patient appears to be doing well with regard to the bilateral lower  extremities at this time. She has been tolerating the dressing changes without complication. Fortunately there is no signs of active infection at this time. No fevers, chills, nausea, vomiting, or diarrhea. 05/05/2019 upon evaluation today patient appears  to be doing excellent in regard to her legs. She has a lot of new skin growth and overall seems to be progressing quite nicely. Fortunately there is no signs of active infection at this time. No fevers, chills, nausea, vomiting, or diarrhea. 05/12/2019 upon evaluation today patient appears to be doing very well with regard to her lower extremities. She is not completely healed at this point but nonetheless he is doing much better even compared to last week. I am very pleased with where things stand. No fevers, chills, nausea, vomiting, or diarrhea. 05/26/2019 upon evaluation today patient actually appears to be doing worse with regard to her lower extremities. She has green drainage unfortunately is having more pain on the right lower extremity as well. Fortunately there is no signs of active infection systemically which is good news. 06/03/2019 upon evaluation today patient appears to be doing about the same in regard to the fact that she still has wounds on both lower extremities although all wounds are measuring better and appearing much better. She finally did start taking the medication which has made a big difference for her that is the Levaquin. Unfortunately this appears to be making her urinate much more frequently. She has been taking this in the evening therefore it is keeping her up at night. They are wondering if they can switch to taking this during the day. I would actually never instructed that they had to take this at night and I think it is definitely fine for her to take it in the morning. She will start that tomorrow. 06/10/2019 upon evaluation today patient appears to be doing excellent in regard to her lower extremities currently. The right lower extremity actually appears to be pretty much healed based on what I am seeing I do not see anything open or draining. The left lower extremity though not completely healed does appear to be doing much better which is great news. Overall there  is no signs of active infection at this time. 06/17/2019 upon evaluation today patient appears to be doing excellent in regard to her ulcers. She has been tolerating the dressing changes without complication in fact everything appears to be almost completely healed today. There is one area on the left lower extremity which appears to potentially still be weeping slightly and I think that we do need to see about wrapping her 1 more week just to make sure everything tightens up before discharge. 06/24/2019 upon evaluation today patient appears to be doing excellent in regard to her bilateral lower extremities. There does not appear to be any evidence of active infection which is great news and overall I am extremely pleased with the fact that again she appears to be ready for discharge. Readmission: 12/11/2019 patient is here for readmission today concerning her bilateral lower extremities. We have not seen her since June 2021. With that being said for the past 2 months apparently she has been having issues with her legs. It was noted by her husband that unfortunately he attempted to call to get an appointment with [place] but was not able to get anything in a sufficient amount of time to get her in sooner due to the fact that again we were having issues with staffing and scheduling. Therefore it was delayed getting him  into the wound care center for his wife. Nonetheless she appears to have evidence of cellulitis today of the bilateral lower extremities. Unfortunately she does not seem to be doing too well she is having a tremendous amount of pain and is asking for pain medication. Obviously it does look like her legs hurt although her wounds appear to be very superficial I am more concerned about the cellulitis aspect of this as opposed to the actual wound openings. 01/06/2020 upon evaluation today patient's legs actually appear to be doing significantly better as compared to last time I saw her when we  were seeing her back for the first evaluation and actually had to send her to the ER for further evaluation. She was admitted to the hospital and was actually there from 12/12/2019 through 12/15/2019. With that being said the patient was placed on IV antibiotics initially, Ancef and IV fluids. Subsequently upon discharge she was given Keflex. With that being said she is getting need an extension of the Keflex as she was only given 3 days in 1 to make sure this completely resolves and does not cause her any additional issues or concerns here. I will send that in for them today. Electronic Signature(s) Signed: 01/06/2020 3:52:29 PM By: Worthy Keeler PA-C Entered By: Worthy Keeler on 01/06/2020 15:52:28 [name] ([id number]) -------------------------------------------------------------------------------- Physical Exam Details Patient Name: ASHONTE, ANGELUCCI B. Date of Service: 01/06/2020 11:00 AM Medical Record Number: [id number] Patient Account Number: [id number] Date of Birth/Sex: 09/04/1945 (75 y.o. F) Treating RN: Cornell Barman Primary Care Provider: Lelon Huh Other Clinician: Referring Provider: Lelon Huh Treating Provider/Extender: Skipper Cliche in Treatment: 3 Constitutional Obese and well-hydrated in no acute distress. Respiratory normal breathing without difficulty. Psychiatric this patient is able to make decisions and demonstrates good insight into disease process. Alert and Oriented x 3. pleasant and cooperative. Notes On inspection patient's wounds currently are minimal and actually doing much better as compared to when I saw her previously on 12/11/2019. She does have significant lower extremity edema she is going to require some compression therapy. But I think we can safely do that at this point without concerns of infection. Her husband is happy to hear this as in the past she has done very well with compression therapy. Electronic Signature(s) Signed: 01/06/2020  3:52:46 PM By: Worthy Keeler PA-C Entered By: Worthy Keeler on 01/06/2020 15:52:46 [name] ([id number]) -------------------------------------------------------------------------------- Physician Orders Details Patient Name: [name] Date of Service: 01/06/2020 11:00 AM Medical Record Number: [id number] Patient Account Number: [id number] Date of Birth/Sex: 07-Oct-1945 (75 y.o. F) Treating RN: Dolan Amen Primary Care Provider: Lelon Huh Other Clinician: Referring Provider: Lelon Huh Treating Provider/Extender: Skipper Cliche in Treatment: 3 Verbal / Phone Orders: No Diagnosis Coding ICD-10 Coding Code Description I89.0 Lymphedema, not elsewhere classified I87.2 Venous insufficiency (chronic) (peripheral) L03.115 Cellulitis of right lower limb L03.116 Cellulitis of left lower limb Bathing/ Shower/ Hygiene o No shower or tub bath. Edema Control - Lymphedema / Segmental Compressive Device / Other Wound #19 Left,Anterior Lower Leg - Bilateral Lower Extremities o Elevate legs to the level of the heart and pump ankles as often as possible o Avoid standing for long periods of time. Wound #20 Right,Lateral Lower Leg - Bilateral Lower Extremities o Elevate legs to the level of the heart and pump ankles as often as possible o Avoid standing for long periods of time. Medications-Please add to medication list. o P.O. Antibiotics - keflex 500mg  TID x 10  days Wound Treatment Wound #19 - Lower Leg Wound Laterality: Left, Anterior Primary Dressing: Silvercel Small 2x2 (in/in) 1 x Per Day/30 Days Discharge Instructions: Apply Silvercel Small 2x2 (in/in) as instructed Secondary Dressing: Xtrasorb Medium 4x5 (in/in) 1 x Per Day/30 Days Discharge Instructions: Apply to wound as directed. Do not cut. Compression Wrap: Profore Lite LF 3 Multilayer Compression Bandaging System 1 x Per Day/30 Days Discharge Instructions: Apply 3 multi-layer wrap as  prescribed. Wound #20 - Lower Leg Wound Laterality: Right, Lateral Primary Dressing: Silvercel Small 2x2 (in/in) 1 x Per Day/30 Days Discharge Instructions: Apply Silvercel Small 2x2 (in/in) as instructed Secondary Dressing: Xtrasorb Medium 4x5 (in/in) 1 x Per Day/30 Days Discharge Instructions: Apply to wound as directed. Do not cut. Compression Wrap: Profore Lite LF 3 Multilayer Compression Bandaging System 1 x Per Day/30 Days Discharge Instructions: Apply 3 multi-layer wrap as prescribed. Patient Medications Allergies: penicillin, hydrochlorothiazide Notifications Medication Indication Start End Keflex 01/06/2020 DOSE 1 - oral 500 mg capsule - 1 capsule oral taken 3 times per day for 10 days RANYIA, WITTING ([id number]) Electronic Signature(s) Signed: 01/06/2020 3:54:35 PM By: Worthy Keeler PA-C Previous Signature: 01/06/2020 11:59:09 AM Version By: Georges Mouse, Minus Breeding RN Entered By: Worthy Keeler on 01/06/2020 15:54:34 [name] ([id number]) -------------------------------------------------------------------------------- Problem List Details Patient Name: CHRISELDA, LEPPERT B. Date of Service: 01/06/2020 11:00 AM Medical Record Number: [id number] Patient Account Number: [id number] Date of Birth/Sex: Jan 07, 1945 (75 y.o. F) Treating RN: Cornell Barman Primary Care Provider: Lelon Huh Other Clinician: Referring Provider: Lelon Huh Treating Provider/Extender: Skipper Cliche in Treatment: 3 Active Problems ICD-10 Encounter Code Description Active Date MDM Diagnosis I89.0 Lymphedema, not elsewhere classified 12/11/2019 No Yes I87.2 Venous insufficiency (chronic) (peripheral) 12/11/2019 No Yes L03.115 Cellulitis of right lower limb 12/11/2019 No Yes L03.116 Cellulitis of left lower limb 12/11/2019 No Yes Inactive Problems Resolved Problems Electronic Signature(s) Signed: 01/06/2020 10:58:15 AM By: Worthy Keeler PA-C Entered By: Worthy Keeler on 01/06/2020 10:58:14 [name] ([id number]) -------------------------------------------------------------------------------- Progress Note Details Patient Name: [name] Date of Service: 01/06/2020 11:00 AM Medical Record Number: [id number] Patient Account Number: [id number] Date of Birth/Sex: 09-20-1945 (75 y.o. F) Treating RN: Cornell Barman Primary Care Provider: Lelon Huh Other Clinician: Referring Provider: Lelon Huh Treating Provider/Extender: Skipper Cliche in Treatment: 3 Subjective Chief Complaint Information obtained from Patient Patient seen for complaints of Non-Healing Wounds to both lower extremities History of Present Illness (HPI) The following HPI elements were documented for the patient's wound: Location: both lower extremity swelling with ulceration 75 year old patient who sees her PCP Dr. Lelon Huh was recently evaluated 10 days ago for diabetes mellitus, hypertension, CHF and hyperlipidemia. she also was noted to have ulcerations develop in her legs and she has been applying Silvadene dressings locally. In the past she has refused wound care referrals.her cardiologist Dr. Saunders Revel saw her and put her on 40 mg of furosemide daily. last hemoglobin A1c was 7.7%. she was also placed on ciprofloxacin twice daily for 7 days and a urine culture was recommended. past medical history significant for coronary artery disease, diabetes mellitus, nonischemic cardiomyopathy and pulmonary hypertension. She is also status post heart catheterization and coronary angiography, tubal ligation and breast cyst removal in the past. She has never been a smoker. Patient had arterial studies done which showed bilateral ABIs are artificially elevated due to noncompressible and calcified vessels. Triphasic waveform throughout. Right great toe TBI is elevated while the left is normal. 10/23/2016 -- the patient is  rather moribund from several issues including chronic back pain and knee pain and swelling of her  legs. The large necrotic area on her left lateral anterior calf was bleeding on touch after washing her leg. There was a spot which needed silver nitrate cauterization and this was done appropriately. 11/06/2016 -- she is again noted to have friable bleeding from the left lower extremity wounds and this again had to be cauterized with silver nitrate to control the bleeding as pressure itself would not do it. 11/20/2016 -- the right lower extremity is completely healed and we have ordered 30-40 mm compression stocking's in both the dural layer and also a pair of juxta lites. 12/18/2016 -- she has not been wearing her compression stockings on her right leg and these have opened out into ulcerations again. Her left lower extremity has circumferential ulcerations now. I believe at this stage I would like to get her venous reflux studies done to make certain that there is no fixable superficial venous reflux. 12/29/2016 -- she has made an excellent recovery having continued with her appropriate doses of diuretics and elevation and exercise. She has not yet received her right lower extremity juxta lites. Her venous duplex study is at the end of January 01/13/16 on evaluation today patient's wounds appeared to be overall doing much better much less hyper granular than previous week's evaluation. The Hydrofera Blue Dressing's to be doing very well. She does tell me that she is having a little bit more discomfort in the posterior aspect of her leg where she has a wound at this point. Fortunately there appears to be no infection. 01/19/17 on evaluation today patient appears to be doing very well in regard to her bilateral lower extremity swelling and at this point in time her left lower extremity ulcers. Her wounds appear to be doing much better. She has been tolerating the dressing's at this point fortunately she did get the Juxta-Lite compression as of today as well. Nonetheless I am pleased that she has been  tolerating everything so well and that her wounds looks so good. In fact she is an excellent granular surface no evidence of slough covering and I do not see any reason for likely debridement today. Especially in regard to the posterior leg. 01/26/17 on evaluation today patient appears to be doing decently well in regard to her wounds. The surface of the majority of her wounds is greatly improved. She has been using the Juxta-Lite compression which is excellent. Nonetheless the wound of the left posterior calf did require some debridement today otherwise the majority of the wounds did not require any debridement. The this was due to slough buildup on the surface. She also has a small skin flap where two of the ulcers actually connected and this has loosened up I'm concerned that things may not heal well with that flap but for the time being I'm not gonna do anything different in that regard. 02/23/17 on evaluation today patient's wound actually appears to be doing fairly well in regard to the remaining left lateral ulcer which we have been treated. With that being said it does appear that her wrap actually slid down the wood bit over the past few days at least that there's actually some friction injury and blistering noted on the medial aspect of her malleolus of the left lower extremity. She continues to use the Juxta- Lite for the right lower extremity. With that being said no debridement is necessary today and these are very superficial injuries  as far as the new injuries are concerned. 03/02/17 on evaluation today patient appears to be doing excellent in regard to her left lateral lower from the ulcers. In fact it appears that she is almost completely healed in regard to the ulcer we have been following and the new injuries which were caused by the wraps slipping down last week have been completely resolved which is excellent news. Overall I'm definitely pleased with the progress that she has made.  Fortunately the rat did not cause any irritation as it did previously with the Unna wrap. 03/09/17 on evaluation today patient's wound appears to be completely healed. Obviously this is great news. She and her husband both are extremely pleased and excited to finally have this gone. Obviously she has been dealing with this wound for quite some time. Readmission: TAMY, ACCARDO ([id number]) 04/14/2019 upon evaluation today patient appears for reevaluation here in our clinic although it has been since March 2019 since I last saw her. She unfortunately is having issues similar to what she had previous which are blisters and ulcerations of the bilateral lower extremities. Fortunately there is no evidence of active infection at this time based on what I am seeing. Unfortunately she is still having a lot of drainage and weeping. I do believe she needs to be compression wrap in order to get this under control and she is previously tolerated a 3 layer compression wrap without any complications whatsoever. No fevers, chills, nausea, vomiting, or diarrhea. 04/28/2019 upon evaluation today patient appears to be doing well with regard to the bilateral lower extremities at this time. She has been tolerating the dressing changes without complication. Fortunately there is no signs of active infection at this time. No fevers, chills, nausea, vomiting, or diarrhea. 05/05/2019 upon evaluation today patient appears to be doing excellent in regard to her legs. She has a lot of new skin growth and overall seems to be progressing quite nicely. Fortunately there is no signs of active infection at this time. No fevers, chills, nausea, vomiting, or diarrhea. 05/12/2019 upon evaluation today patient appears to be doing very well with regard to her lower extremities. She is not completely healed at this point but nonetheless he is doing much better even compared to last week. I am very pleased with where things stand. No fevers,  chills, nausea, vomiting, or diarrhea. 05/26/2019 upon evaluation today patient actually appears to be doing worse with regard to her lower extremities. She has green drainage unfortunately is having more pain on the right lower extremity as well. Fortunately there is no signs of active infection systemically which is good news. 06/03/2019 upon evaluation today patient appears to be doing about the same in regard to the fact that she still has wounds on both lower extremities although all wounds are measuring better and appearing much better. She finally did start taking the medication which has made a big difference for her that is the Levaquin. Unfortunately this appears to be making her urinate much more frequently. She has been taking this in the evening therefore it is keeping her up at night. They are wondering if they can switch to taking this during the day. I would actually never instructed that they had to take this at night and I think it is definitely fine for her to take it in the morning. She will start that tomorrow. 06/10/2019 upon evaluation today patient appears to be doing excellent in regard to her lower extremities currently. The right lower extremity actually  appears to be pretty much healed based on what I am seeing I do not see anything open or draining. The left lower extremity though not completely healed does appear to be doing much better which is great news. Overall there is no signs of active infection at this time. 06/17/2019 upon evaluation today patient appears to be doing excellent in regard to her ulcers. She has been tolerating the dressing changes without complication in fact everything appears to be almost completely healed today. There is one area on the left lower extremity which appears to potentially still be weeping slightly and I think that we do need to see about wrapping her 1 more week just to make sure everything tightens up before discharge. 06/24/2019 upon  evaluation today patient appears to be doing excellent in regard to her bilateral lower extremities. There does not appear to be any evidence of active infection which is great news and overall I am extremely pleased with the fact that again she appears to be ready for discharge. Readmission: 12/11/2019 patient is here for readmission today concerning her bilateral lower extremities. We have not seen her since June 2021. With that being said for the past 2 months apparently she has been having issues with her legs. It was noted by her husband that unfortunately he attempted to call to get an appointment with [place] but was not able to get anything in a sufficient amount of time to get her in sooner due to the fact that again we were having issues with staffing and scheduling. Therefore it was delayed getting him into the wound care center for his wife. Nonetheless she appears to have evidence of cellulitis today of the bilateral lower extremities. Unfortunately she does not seem to be doing too well she is having a tremendous amount of pain and is asking for pain medication. Obviously it does look like her legs hurt although her wounds appear to be very superficial I am more concerned about the cellulitis aspect of this as opposed to the actual wound openings. 01/06/2020 upon evaluation today patient's legs actually appear to be doing significantly better as compared to last time I saw her when we were seeing her back for the first evaluation and actually had to send her to the ER for further evaluation. She was admitted to the hospital and was actually there from 12/12/2019 through 12/15/2019. With that being said the patient was placed on IV antibiotics initially, Ancef and IV fluids. Subsequently upon discharge she was given Keflex. With that being said she is getting need an extension of the Keflex as she was only given 3 days in 1 to make sure this completely resolves and does not cause her any  additional issues or concerns here. I will send that in for them today. Objective Constitutional Obese and well-hydrated in no acute distress. Vitals Time Taken: 10:49 AM, Temperature: 97.6 F, Pulse: 64 bpm, Respiratory Rate: 22 breaths/min, Blood Pressure: 160/80 mmHg. Respiratory normal breathing without difficulty. Psychiatric this patient is able to make decisions and demonstrates good insight into disease process. Alert and Oriented x 3. pleasant and cooperative. ANNARAE, MACNAIR ([id number]) General Notes: On inspection patient's wounds currently are minimal and actually doing much better as compared to when I saw her previously on 12/11/2019. She does have significant lower extremity edema she is going to require some compression therapy. But I think we can safely do that at this point without concerns of infection. Her husband is happy to hear this as  in the past she has done very well with compression therapy. Integumentary (Hair, Skin) Wound #19 status is Open. Original cause of wound was Gradually Appeared. The wound is located on the Left,Anterior Lower Leg. The wound measures 0.5cm length x 0.6cm width x 0.1cm depth; 0.236cm^2 area and 0.024cm^3 volume. There is Fat Layer (Subcutaneous Tissue) exposed. There is no tunneling or undermining noted. There is a medium amount of serosanguineous drainage noted. There is medium (34-66%) granulation within the wound bed. There is a medium (34-66%) amount of necrotic tissue within the wound bed including Adherent Slough. Wound #20 status is Open. Original cause of wound was Gradually Appeared. The wound is located on the Right,Lateral Lower Leg. The wound measures 0.2cm length x 0.2cm width x 0.1cm depth; 0.031cm^2 area and 0.003cm^3 volume. There is Fat Layer (Subcutaneous Tissue) exposed. There is no tunneling or undermining noted. There is a medium amount of serosanguineous drainage noted. There is large (67-100%) red granulation within the  wound bed. There is no necrotic tissue within the wound bed. Assessment Active Problems ICD-10 Lymphedema, not elsewhere classified Venous insufficiency (chronic) (peripheral) Cellulitis of right lower limb Cellulitis of left lower limb Procedures Wound #19 Pre-procedure diagnosis of Wound #19 is a Lymphedema located on the Left,Anterior Lower Leg . There was a Three Layer Compression Therapy Procedure by Dolan Amen, RN. Post procedure Diagnosis Wound #19: Same as Pre-Procedure Wound #20 Pre-procedure diagnosis of Wound #20 is a Lymphedema located on the Right,Lateral Lower Leg . There was a Three Layer Compression Therapy Procedure by Dolan Amen, RN. Post procedure Diagnosis Wound #20: Same as Pre-Procedure Plan Bathing/ Shower/ Hygiene: No shower or tub bath. Edema Control - Lymphedema / Segmental Compressive Device / Other: Wound #19 Left,Anterior Lower Leg: Elevate legs to the level of the heart and pump ankles as often as possible Avoid standing for long periods of time. Wound #20 Right,Lateral Lower Leg: Elevate legs to the level of the heart and pump ankles as often as possible Avoid standing for long periods of time. Medications-Please add to medication list.: P.O. Antibiotics - keflex 500mg  TID x 10 days The following medication(s) was prescribed: Keflex oral 500 mg capsule 1 1 capsule oral taken 3 times per day for 10 days starting 01/06/2020 WOUND #19: - Lower Leg Wound Laterality: Left, Anterior Primary Dressing: Silvercel Small 2x2 (in/in) 1 x Per Day/30 Days Discharge Instructions: Apply Silvercel Small 2x2 (in/in) as instructed Secondary Dressing: Xtrasorb Medium 4x5 (in/in) 1 x Per Day/30 Days Discharge Instructions: Apply to wound as directed. Do not cut. Compression Wrap: Profore Lite LF 3 Multilayer Compression Bandaging System 1 x Per Day/30 Days Discharge Instructions: Apply 3 multi-layer wrap as prescribed. WOUND #20: - Lower Leg Wound Laterality:  Right, Lateral OLIVIYA, GILKISON B. ([id number]) Primary Dressing: Silvercel Small 2x2 (in/in) 1 x Per Day/30 Days Discharge Instructions: Apply Silvercel Small 2x2 (in/in) as instructed Secondary Dressing: Xtrasorb Medium 4x5 (in/in) 1 x Per Day/30 Days Discharge Instructions: Apply to wound as directed. Do not cut. Compression Wrap: Profore Lite LF 3 Multilayer Compression Bandaging System 1 x Per Day/30 Days Discharge Instructions: Apply 3 multi-layer wrap as prescribed. 1. I would recommend currently that we go ahead and continue with the elevation of the patient's legs try to keep edema under control. 2. I am also can recommend continuing the Keflex for her and that is can be 3 times a day. 3. I am also can recommend at this time that we have the patient continue to use the  silver alginate dressing that she has done extremely well with in the past. I think that still a very good option for her currently. 4. I am also can recommend a 3 layer compression wrap that she is tolerated in an excellent fashion in the past as well. We will see patient back for reevaluation in 1 week here in the clinic. If anything worsens or changes patient will contact our office for additional recommendations. Electronic Signature(s) Signed: 01/06/2020 3:54:52 PM By: Worthy Keeler PA-C Entered By: Worthy Keeler on 01/06/2020 15:54:52 Graciano, Jeanice Lim ([id number]) -------------------------------------------------------------------------------- [place] Details Patient Name: [name] Date of Service: 01/06/2020 Medical Record Number: [id number] Patient Account Number: [id number] Date of Birth/Sex: 06-22-1945 (75 y.o. F) Treating RN: Dolan Amen Primary Care Provider: Lelon Huh Other Clinician: Referring Provider: Lelon Huh Treating Provider/Extender: Skipper Cliche in Treatment: 3 Diagnosis Coding ICD-10 Codes Code Description I89.0 Lymphedema, not elsewhere classified I87.2 Venous  insufficiency (chronic) (peripheral) L03.115 Cellulitis of right lower limb L03.116 Cellulitis of left lower limb Facility Procedures CPT4: Description Modifier Quantity Code [id number] BILATERAL: Application of multi-layer venous compression system; leg (below knee), including 1 ankle and foot. Physician Procedures CPT4 Code: 6195093 Description: 26712 - WC PHYS LEVEL 4 - EST PT Modifier: Quantity: 1 CPT4 Code: Description: ICD-10 Diagnosis Description I89.0 Lymphedema, not elsewhere classified I87.2 Venous insufficiency (chronic) (peripheral) L03.115 Cellulitis of right lower limb L03.116 Cellulitis of left lower limb Modifier: Quantity: Electronic Signature(s) Signed: 01/06/2020 3:55:09 PM By: Worthy Keeler PA-C Previous Signature: 01/06/2020 11:59:09 AM Version By: Georges Mouse, Minus Breeding RN Entered By: Worthy Keeler on 01/06/2020 15:55:09

## 2020-01-09 ENCOUNTER — Ambulatory Visit: Payer: Medicare Other | Admitting: Urology

## 2020-01-09 NOTE — Progress Notes (Signed)
CANDYCE, GAMBINO ([id number]) Visit Report for 01/06/2020 Arrival Information Details Patient Name: [name], [name]. Date of Service: 01/06/2020 11:00 AM Medical Record Number: [id number] Patient Account Number: [id number] Date of Birth/Sex: 1945/12/07 (75 y.o. F) Treating RN: Carlene Coria Primary Care Wanona Stare: Lelon Huh Other Clinician: Referring Aamira Bischoff: Lelon Huh Treating Miyoshi Ligas/Extender: Skipper Cliche in Treatment: 3 Visit Information History Since Last Visit All ordered tests and consults were completed: No Patient Arrived: Wheel Chair Added or deleted any medications: No Arrival Time: 10:49 Any new allergies or adverse reactions: No Accompanied By: husband Had a fall or experienced change in No Transfer Assistance: None activities of daily living that may affect Patient Identification Verified: Yes risk of falls: Secondary Verification Process Completed: Yes Signs or symptoms of abuse/neglect since last visito No Patient Requires Transmission-Based Precautions: No Hospitalized since last visit: No Patient Has Alerts: No Implantable device outside of the clinic excluding No cellular tissue based products placed in the center since last visit: Has Dressing in Place as Prescribed: Yes Has Compression in Place as Prescribed: No Pain Present Now: No Electronic Signature(s) Signed: 01/09/2020 4:22:48 PM By: Carlene Coria RN Entered By: Carlene Coria on 01/06/2020 10:49:56 [name] ([id number]) -------------------------------------------------------------------------------- Compression Therapy Details Patient Name: Jillian Albe B. Date of Service: 01/06/2020 11:00 AM Medical Record Number: [id number] Patient Account Number: [id number] Date of Birth/Sex: Nov 23, 1945 (75 y.o. F) Treating RN: Dolan Amen Primary Care Jolynne Spurgin: Lelon Huh Other Clinician: Referring Garnet Chatmon: Lelon Huh Treating Tobi Groesbeck/Extender: Skipper Cliche in Treatment:  3 Compression Therapy Performed for Wound Assessment: Wound #19 Left,Anterior Lower Leg Performed By: Clinician Dolan Amen, RN Compression Type: Three Layer Post Procedure Diagnosis Same as Pre-procedure Electronic Signature(s) Signed: 01/06/2020 11:59:09 AM By: Georges Mouse, Minus Breeding RN Entered By: Georges Mouse, Minus Breeding on 01/06/2020 11:19:08 [name] ([id number]) -------------------------------------------------------------------------------- Compression Therapy Details Patient Name: [name], Jillian B. Date of Service: 01/06/2020 11:00 AM Medical Record Number: [id number] Patient Account Number: [id number] Date of Birth/Sex: 05-14-1945 (75 y.o. F) Treating RN: Dolan Amen Primary Care Manuel Lawhead: Lelon Huh Other Clinician: Referring Izel Eisenhardt: Lelon Huh Treating Maranda Marte/Extender: Skipper Cliche in Treatment: 3 Compression Therapy Performed for Wound Assessment: Wound #20 Right,Lateral Lower Leg Performed By: Clinician Dolan Amen, RN Compression Type: Three Layer Post Procedure Diagnosis Same as Pre-procedure Electronic Signature(s) Signed: 01/06/2020 11:59:09 AM By: Georges Mouse, Minus Breeding RN Entered By: Georges Mouse, Minus Breeding on 01/06/2020 11:19:08 [name] ([id number]) -------------------------------------------------------------------------------- Encounter Discharge Information Details Patient Name: [name], Jillian B. Date of Service: 01/06/2020 11:00 AM Medical Record Number: [id number] Patient Account Number: [id number] Date of Birth/Sex: 08-May-1945 (75 y.o. F) Treating RN: Dolan Amen Primary Care Abie Cheek: Lelon Huh Other Clinician: Referring Kwaku Mostafa: Lelon Huh Treating Mihran Lebarron/Extender: Skipper Cliche in Treatment: 3 Encounter Discharge Information Items Discharge Condition: Stable Ambulatory Status: Wheelchair Discharge Destination: Home Transportation: Private Auto Accompanied By: husband Schedule Follow-up Appointment:  Yes Clinical Summary of Care: Electronic Signature(s) Signed: 01/06/2020 11:59:09 AM By: Georges Mouse, Minus Breeding RN Entered By: Georges Mouse, Minus Breeding on 01/06/2020 11:41:36 [name] ([id number]) -------------------------------------------------------------------------------- Lower Extremity Assessment Details Patient Name: [name], Jillian B. Date of Service: 01/06/2020 11:00 AM Medical Record Number: [id number] Patient Account Number: [id number] Date of Birth/Sex: 1945/09/04 (75 y.o. F) Treating RN: Carlene Coria Primary Care Tamberly Pomplun: Lelon Huh Other Clinician: Referring Rohail Klees: Lelon Huh Treating Nychelle Cassata/Extender: Skipper Cliche in Treatment: 3 Edema Assessment Assessed: [Left: No] [Right: No] [Left: Edema] [Right: :] Calf Left: Right: Point of Measurement: 33 cm From Medial Instep 53 cm 48 cm  Ankle Left: Right: Point of Measurement: 8 cm From Medial Instep 28 cm 25 cm Electronic Signature(s) Signed: 01/09/2020 4:22:48 PM By: Carlene Coria RN Entered By: Carlene Coria on 01/06/2020 11:02:22 [name] ([id number]) -------------------------------------------------------------------------------- Multi Wound Chart Details Patient Name: Jillian Albe B. Date of Service: 01/06/2020 11:00 AM Medical Record Number: [id number] Patient Account Number: [id number] Date of Birth/Sex: Feb 16, 1945 (75 y.o. F) Treating RN: Dolan Amen Primary Care Teralyn Mullins: Lelon Huh Other Clinician: Referring Chassity Ludke: Lelon Huh Treating Stephenson Cichy/Extender: Skipper Cliche in Treatment: 3 Vital Signs Height(in): Pulse(bpm): 57 Weight(lbs): Blood Pressure(mmHg): 160/80 Body Mass Index(BMI): Temperature(F): 97.6 Respiratory Rate(breaths/min): 22 Photos: [N/A:N/A] Wound Location: Left, Anterior Lower Leg Right, Lateral Lower Leg N/A Wounding Event: Gradually Appeared Gradually Appeared N/A Primary Etiology: Lymphedema Lymphedema N/A Comorbid History: Lymphedema, Asthma,  Congestive Lymphedema, Asthma, Congestive N/A Heart Failure, Coronary Artery Heart Failure, Coronary Artery Disease, Hypertension, Type II Disease, Hypertension, Type II Diabetes, Osteoarthritis Diabetes, Osteoarthritis Date Acquired: 12/17/2019 12/17/2019 N/A Weeks of Treatment: 0 0 N/A Wound Status: Open Open N/A Measurements L x W x D (cm) 0.5x0.6x0.1 0.2x0.2x0.1 N/A Area (cm) : 0.236 0.031 N/A Volume (cm) : 0.024 0.003 N/A % Reduction in Area: 0.00% 0.00% N/A % Reduction in Volume: 0.00% 0.00% N/A Classification: Full Thickness Without Exposed Full Thickness Without Exposed N/A Support Structures Support Structures Exudate Amount: Medium Medium N/A Exudate Type: Serosanguineous Serosanguineous N/A Exudate Color: red, brown red, brown N/A Granulation Amount: Medium (34-66%) Large (67-100%) N/A Granulation Quality: N/A Red N/A Necrotic Amount: Medium (34-66%) None Present (0%) N/A Exposed Structures: Fat Layer (Subcutaneous Tissue): Fat Layer (Subcutaneous Tissue): N/A Yes Yes Fascia: No Fascia: No Tendon: No Tendon: No Muscle: No Muscle: No Joint: No Joint: No Bone: No Bone: No Epithelialization: None None N/A Procedures Performed: Compression Therapy Compression Therapy N/A Treatment Notes Electronic Signature(s) Signed: 01/06/2020 11:59:09 AM By: Georges Mouse, Minus Breeding RN Entered By: Georges Mouse, Minus Breeding on 01/06/2020 11:19:15 [name] ([id number]) -------------------------------------------------------------------------------- [place] Details Patient Name: JUDIANN, CELIA B. Date of Service: 01/06/2020 11:00 AM Medical Record Number: [id number] Patient Account Number: [id number] Date of Birth/Sex: Mar 26, 1945 (75 y.o. F) Treating RN: Dolan Amen Primary Care Junia Nygren: Lelon Huh Other Clinician: Referring Kimblery Diop: Lelon Huh Treating Saleen Peden/Extender: Skipper Cliche in Treatment: 3 Active Inactive Pain, Acute or  Chronic Nursing Diagnoses: Pain, acute or chronic: actual or potential Goals: Patient will verbalize adequate pain control and receive pain control interventions during procedures as needed Date Initiated: 12/11/2019 Target Resolution Date: 01/11/2020 Goal Status: Active Patient/caregiver will verbalize comfort level met Date Initiated: 12/11/2019 Target Resolution Date: 01/11/2020 Goal Status: Active Interventions: Assess comfort goal upon admission Complete pain assessment as per visit requirements Notes: Wound/Skin Impairment Nursing Diagnoses: Impaired tissue integrity Goals: Patient/caregiver will verbalize understanding of skin care regimen Date Initiated: 12/11/2019 Target Resolution Date: 01/11/2020 Goal Status: Active Ulcer/skin breakdown will have a volume reduction of 30% by week 4 Date Initiated: 12/11/2019 Target Resolution Date: 01/11/2020 Goal Status: Active Interventions: Assess patient/caregiver ability to obtain necessary supplies Assess patient/caregiver ability to perform ulcer/skin care regimen upon admission and as needed Provide education on ulcer and skin care Treatment Activities: Skin care regimen initiated : 12/11/2019 Notes: Electronic Signature(s) Signed: 01/06/2020 11:59:09 AM By: Georges Mouse, Minus Breeding RN Entered By: Georges Mouse, Minus Breeding on 01/06/2020 11:10:21 [name] ([id number]) -------------------------------------------------------------------------------- Pain Assessment Details Patient Name: Jillian Albe B. Date of Service: 01/06/2020 11:00 AM Medical Record Number: [id number] Patient Account Number: [id number] Date of Birth/Sex: [birth year] (74 y.o. F) Treating RN:  Carlene Coria Primary Care Maxie Debose: Lelon Huh Other Clinician: Referring Kaveh Kissinger: Lelon Huh Treating Chancie Lampert/Extender: Skipper Cliche in Treatment: 3 Active Problems Location of Pain Severity and Description of Pain Patient Has Paino No Site Locations Pain  Management and Medication Current Pain Management: Electronic Signature(s) Signed: 01/09/2020 4:22:48 PM By: Carlene Coria RN Entered By: Carlene Coria on 01/06/2020 10:50:35 [name] ([id number]) -------------------------------------------------------------------------------- Wound Assessment Details Patient Name: [name], Jillian B. Date of Service: 01/06/2020 11:00 AM Medical Record Number: [id number] Patient Account Number: [id number] Date of Birth/Sex: Dec 10, 1945 (75 y.o. F) Treating RN: Carlene Coria Primary Care Violett Hobbs: Lelon Huh Other Clinician: Referring Caydance Kuehnle: Lelon Huh Treating Rylen Hou/Extender: Skipper Cliche in Treatment: 3 Wound Status Wound Number: 19 Primary Lymphedema Etiology: Wound Location: Left, Anterior Lower Leg Wound Open Wounding Event: Gradually Appeared Status: Date Acquired: 12/17/2019 Comorbid Lymphedema, Asthma, Congestive Heart Failure, Weeks Of Treatment: 0 History: Coronary Artery Disease, Hypertension, Type II Diabetes, Clustered Wound: No Osteoarthritis Photos Wound Measurements Length: (cm) 0.5 Width: (cm) 0.6 Depth: (cm) 0.1 Area: (cm) 0.236 Volume: (cm) 0.024 % Reduction in Area: 0% % Reduction in Volume: 0% Epithelialization: None Tunneling: No Undermining: No Wound Description Classification: Full Thickness Without Exposed Support Structu Exudate Amount: Medium Exudate Type: Serosanguineous Exudate Color: red, brown res Foul Odor After Cleansing: No Slough/Fibrino Yes Wound Bed Granulation Amount: Medium (34-66%) Exposed Structure Necrotic Amount: Medium (34-66%) Fascia Exposed: No Necrotic Quality: Adherent Slough Fat Layer (Subcutaneous Tissue) Exposed: Yes Tendon Exposed: No Muscle Exposed: No Joint Exposed: No Bone Exposed: No Treatment Notes Wound #19 (Lower Leg) Wound Laterality: Left, Anterior Cleanser Peri-Wound Care Topical Primary Dressing JOLEAN, MADARIAGA ([id number]) Silvercel Small 2x2  (in/in) Discharge Instruction: Apply Silvercel Small 2x2 (in/in) as instructed Secondary Dressing Xtrasorb Medium 4x5 (in/in) Discharge Instruction: Apply to wound as directed. Do not cut. Secured With Compression Wrap Profore Lite LF 3 Multilayer Compression Bandaging System Discharge Instruction: Apply 3 multi-layer wrap as prescribed. Compression Stockings Add-Ons Electronic Signature(s) Signed: 01/09/2020 4:22:48 PM By: Carlene Coria RN Entered By: Carlene Coria on 01/06/2020 11:03:57 [name] ([id number]) -------------------------------------------------------------------------------- Wound Assessment Details Patient Name: [name], Jillian B. Date of Service: 01/06/2020 11:00 AM Medical Record Number: [id number] Patient Account Number: [id number] Date of Birth/Sex: Feb 17, 1945 (75 y.o. F) Treating RN: Carlene Coria Primary Care Dana Dorner: Lelon Huh Other Clinician: Referring Tanazia Achee: Lelon Huh Treating Areona Homer/Extender: Skipper Cliche in Treatment: 3 Wound Status Wound Number: 20 Primary Lymphedema Etiology: Wound Location: Right, Lateral Lower Leg Wound Open Wounding Event: Gradually Appeared Status: Date Acquired: 12/17/2019 Comorbid Lymphedema, Asthma, Congestive Heart Failure, Weeks Of Treatment: 0 History: Coronary Artery Disease, Hypertension, Type II Diabetes, Clustered Wound: No Osteoarthritis Photos Wound Measurements Length: (cm) 0.2 Width: (cm) 0.2 Depth: (cm) 0.1 Area: (cm) 0.031 Volume: (cm) 0.003 % Reduction in Area: 0% % Reduction in Volume: 0% Epithelialization: None Tunneling: No Undermining: No Wound Description Classification: Full Thickness Without Exposed Support Structu Exudate Amount: Medium Exudate Type: Serosanguineous Exudate Color: red, brown res Foul Odor After Cleansing: No Slough/Fibrino No Wound Bed Granulation Amount: Large (67-100%) Exposed Structure Granulation Quality: Red Fascia Exposed: No Necrotic Amount:  None Present (0%) Fat Layer (Subcutaneous Tissue) Exposed: Yes Tendon Exposed: No Muscle Exposed: No Joint Exposed: No Bone Exposed: No Treatment Notes Wound #20 (Lower Leg) Wound Laterality: Right, Lateral Cleanser Peri-Wound Care Topical Primary Dressing KAYLINA, CAHUE ([id number]) Silvercel Small 2x2 (in/in) Discharge Instruction: Apply Silvercel Small 2x2 (in/in) as instructed Secondary Dressing Xtrasorb Medium 4x5 (in/in) Discharge Instruction: Apply to wound as  directed. Do not cut. Secured With Compression Wrap Profore Lite LF 3 Multilayer Compression Bandaging System Discharge Instruction: Apply 3 multi-layer wrap as prescribed. Compression Stockings Add-Ons Electronic Signature(s) Signed: 01/09/2020 4:22:48 PM By: Carlene Coria RN Entered By: Carlene Coria on 01/06/2020 11:04:29 [name] ([id number]) -------------------------------------------------------------------------------- Vitals Details Patient Name: Jillian Albe B. Date of Service: 01/06/2020 11:00 AM Medical Record Number: [id number] Patient Account Number: [id number] Date of Birth/Sex: 10/27/1945 (75 y.o. F) Treating RN: Carlene Coria Primary Care Rebekka Lobello: Lelon Huh Other Clinician: Referring Jahaan Vanwagner: Lelon Huh Treating Frederica Chrestman/Extender: Skipper Cliche in Treatment: 3 Vital Signs Time Taken: 10:49 Temperature (F): 97.6 Pulse (bpm): 64 Respiratory Rate (breaths/min): 22 Blood Pressure (mmHg): 160/80 Reference Range: 80 - 120 mg / dl Electronic Signature(s) Signed: 01/09/2020 4:22:48 PM By: Carlene Coria RN Entered By: Carlene Coria on 01/06/2020 10:50:29

## 2020-01-13 ENCOUNTER — Encounter: Payer: Medicare Other | Admitting: Physician Assistant

## 2020-01-13 ENCOUNTER — Other Ambulatory Visit: Payer: Self-pay

## 2020-01-13 DIAGNOSIS — I110 Hypertensive heart disease with heart failure: Secondary | ICD-10-CM | POA: Diagnosis not present

## 2020-01-13 DIAGNOSIS — I890 Lymphedema, not elsewhere classified: Secondary | ICD-10-CM | POA: Diagnosis not present

## 2020-01-13 DIAGNOSIS — E119 Type 2 diabetes mellitus without complications: Secondary | ICD-10-CM | POA: Diagnosis not present

## 2020-01-13 DIAGNOSIS — I872 Venous insufficiency (chronic) (peripheral): Secondary | ICD-10-CM | POA: Diagnosis not present

## 2020-01-13 DIAGNOSIS — L03116 Cellulitis of left lower limb: Secondary | ICD-10-CM | POA: Diagnosis not present

## 2020-01-13 DIAGNOSIS — L97822 Non-pressure chronic ulcer of other part of left lower leg with fat layer exposed: Secondary | ICD-10-CM | POA: Diagnosis not present

## 2020-01-13 DIAGNOSIS — I509 Heart failure, unspecified: Secondary | ICD-10-CM | POA: Diagnosis not present

## 2020-01-13 DIAGNOSIS — I272 Pulmonary hypertension, unspecified: Secondary | ICD-10-CM | POA: Diagnosis not present

## 2020-01-13 DIAGNOSIS — L03115 Cellulitis of right lower limb: Secondary | ICD-10-CM | POA: Diagnosis not present

## 2020-01-13 DIAGNOSIS — L97312 Non-pressure chronic ulcer of right ankle with fat layer exposed: Secondary | ICD-10-CM | POA: Diagnosis not present

## 2020-01-13 DIAGNOSIS — I2510 Atherosclerotic heart disease of native coronary artery without angina pectoris: Secondary | ICD-10-CM | POA: Diagnosis not present

## 2020-01-13 DIAGNOSIS — I428 Other cardiomyopathies: Secondary | ICD-10-CM | POA: Diagnosis not present

## 2020-01-13 DIAGNOSIS — L97819 Non-pressure chronic ulcer of other part of right lower leg with unspecified severity: Secondary | ICD-10-CM | POA: Diagnosis not present

## 2020-01-13 NOTE — Progress Notes (Addendum)
SHEKELIA, BOUTIN ([id number]) Visit Report for 01/13/2020 Chief Complaint Document Details Patient Name: [name], [name]. Date of Service: 01/13/2020 1:30 PM Medical Record Number: [id number] Patient Account Number: [id number] Date of Birth/Sex: 08-20-1945 (75 y.o. F) Treating RN: Cornell Barman Primary Care Provider: Lelon Huh Other Clinician: Referring Provider: Lelon Huh Treating Provider/Extender: Skipper Cliche in Treatment: 4 Information Obtained from: Patient Chief Complaint Patient seen for complaints of Non-Healing Wounds to both lower extremities Electronic Signature(s) Signed: 01/13/2020 1:31:26 PM By: Worthy Keeler PA-C Entered By: Worthy Keeler on 01/13/2020 13:31:24 [name] ([id number]) -------------------------------------------------------------------------------- HPI Details Patient Name: [name] Date of Service: 01/13/2020 1:30 PM Medical Record Number: [id number] Patient Account Number: [id number] Date of Birth/Sex: Dec 19, 1945 (75 y.o. F) Treating RN: Cornell Barman Primary Care Provider: Lelon Huh Other Clinician: Referring Provider: Lelon Huh Treating Provider/Extender: Skipper Cliche in Treatment: 4 History of Present Illness Location: both lower extremity swelling with ulceration HPI Description: 75 year old patient who sees her PCP Dr. Lelon Huh was recently evaluated 10 days ago for diabetes mellitus, hypertension, CHF and hyperlipidemia. she also was noted to have ulcerations develop in her legs and she has been applying Silvadene dressings locally. In the past she has refused wound care referrals.her cardiologist Dr. Saunders Revel saw her and put her on 40 mg of furosemide daily. last hemoglobin A1c was 7.7%. she was also placed on ciprofloxacin twice daily for 7 days and a urine culture was recommended. past medical history significant for coronary artery disease, diabetes mellitus, nonischemic cardiomyopathy and pulmonary  hypertension. She is also status post heart catheterization and coronary angiography, tubal ligation and breast cyst removal in the past. She has never been a smoker. Patient had arterial studies done which showed bilateral ABIs are artificially elevated due to noncompressible and calcified vessels. Triphasic waveform throughout. Right great toe TBI is elevated while the left is normal. 10/23/2016 -- the patient is rather moribund from several issues including chronic back pain and knee pain and swelling of her legs. The large necrotic area on her left lateral anterior calf was bleeding on touch after washing her leg. There was a spot which needed silver nitrate cauterization and this was done appropriately. 11/06/2016 -- she is again noted to have friable bleeding from the left lower extremity wounds and this again had to be cauterized with silver nitrate to control the bleeding as pressure itself would not do it. 11/20/2016 -- the right lower extremity is completely healed and we have ordered 30-40 mm compression stocking's in both the dural layer and also a pair of juxta lites. 12/18/2016 -- she has not been wearing her compression stockings on her right leg and these have opened out into ulcerations again. Her left lower extremity has circumferential ulcerations now. I believe at this stage I would like to get her venous reflux studies done to make certain that there is no fixable superficial venous reflux. 12/29/2016 -- she has made an excellent recovery having continued with her appropriate doses of diuretics and elevation and exercise. She has not yet received her right lower extremity juxta lites. Her venous duplex study is at the end of January 01/13/16 on evaluation today patient's wounds appeared to be overall doing much better much less hyper granular than previous week's evaluation. The Hydrofera Blue Dressing's to be doing very well. She does tell me that she is having a little bit more  discomfort in the posterior aspect of her leg where she has a wound at this  point. Fortunately there appears to be no infection. 01/19/17 on evaluation today patient appears to be doing very well in regard to her bilateral lower extremity swelling and at this point in time her left lower extremity ulcers. Her wounds appear to be doing much better. She has been tolerating the dressing's at this point fortunately she did get the Juxta-Lite compression as of today as well. Nonetheless I am pleased that she has been tolerating everything so well and that her wounds looks so good. In fact she is an excellent granular surface no evidence of slough covering and I do not see any reason for likely debridement today. Especially in regard to the posterior leg. 01/26/17 on evaluation today patient appears to be doing decently well in regard to her wounds. The surface of the majority of her wounds is greatly improved. She has been using the Juxta-Lite compression which is excellent. Nonetheless the wound of the left posterior calf did require some debridement today otherwise the majority of the wounds did not require any debridement. The this was due to slough buildup on the surface. She also has a small skin flap where two of the ulcers actually connected and this has loosened up I'm concerned that things may not heal well with that flap but for the time being I'm not gonna do anything different in that regard. 02/23/17 on evaluation today patient's wound actually appears to be doing fairly well in regard to the remaining left lateral ulcer which we have been treated. With that being said it does appear that her wrap actually slid down the wood bit over the past few days at least that there's actually some friction injury and blistering noted on the medial aspect of her malleolus of the left lower extremity. She continues to use the Juxta- Lite for the right lower extremity. With that being said no debridement is  necessary today and these are very superficial injuries as far as the new injuries are concerned. 03/02/17 on evaluation today patient appears to be doing excellent in regard to her left lateral lower from the ulcers. In fact it appears that she is almost completely healed in regard to the ulcer we have been following and the new injuries which were caused by the wraps slipping down last week have been completely resolved which is excellent news. Overall I'm definitely pleased with the progress that she has made. Fortunately the rat did not cause any irritation as it did previously with the Unna wrap. 03/09/17 on evaluation today patient's wound appears to be completely healed. Obviously this is great news. She and her husband both are extremely pleased and excited to finally have this gone. Obviously she has been dealing with this wound for quite some time. Readmission: 04/14/2019 upon evaluation today patient appears for reevaluation here in our clinic although it has been since March 2019 since I last saw her. She unfortunately is having issues similar to what she had previous which are blisters and ulcerations of the bilateral lower extremities. Fortunately there is no evidence of active infection at this time based on what I am seeing. Unfortunately she is still having a lot of drainage and weeping. I do believe she needs to be compression wrap in order to get this under control and she is previously tolerated a 3 layer compression wrap without any complications whatsoever. No fevers, chills, nausea, vomiting, or diarrhea. [name], [name] ([id number]) 04/28/2019 upon evaluation today patient appears to be doing well with regard to the bilateral lower  extremities at this time. She has been tolerating the dressing changes without complication. Fortunately there is no signs of active infection at this time. No fevers, chills, nausea, vomiting, or diarrhea. 05/05/2019 upon evaluation today patient appears  to be doing excellent in regard to her legs. She has a lot of new skin growth and overall seems to be progressing quite nicely. Fortunately there is no signs of active infection at this time. No fevers, chills, nausea, vomiting, or diarrhea. 05/12/2019 upon evaluation today patient appears to be doing very well with regard to her lower extremities. She is not completely healed at this point but nonetheless he is doing much better even compared to last week. I am very pleased with where things stand. No fevers, chills, nausea, vomiting, or diarrhea. 05/26/2019 upon evaluation today patient actually appears to be doing worse with regard to her lower extremities. She has green drainage unfortunately is having more pain on the right lower extremity as well. Fortunately there is no signs of active infection systemically which is good news. 06/03/2019 upon evaluation today patient appears to be doing about the same in regard to the fact that she still has wounds on both lower extremities although all wounds are measuring better and appearing much better. She finally did start taking the medication which has made a big difference for her that is the Levaquin. Unfortunately this appears to be making her urinate much more frequently. She has been taking this in the evening therefore it is keeping her up at night. They are wondering if they can switch to taking this during the [name]. I would actually never instructed that they had to take this at night and I think it is definitely fine for her to take it in the morning. She will start that tomorrow. 06/10/2019 upon evaluation today patient appears to be doing excellent in regard to her lower extremities currently. The right lower extremity actually appears to be pretty much healed based on what I am seeing I do not see anything open or draining. The left lower extremity though not completely healed does appear to be doing much better which is great news. Overall there  is no signs of active infection at this time. 06/17/2019 upon evaluation today patient appears to be doing excellent in regard to her ulcers. She has been tolerating the dressing changes without complication in fact everything appears to be almost completely healed today. There is one area on the left lower extremity which appears to potentially still be weeping slightly and I think that we do need to see about wrapping her 1 more week just to make sure everything tightens up before discharge. 06/24/2019 upon evaluation today patient appears to be doing excellent in regard to her bilateral lower extremities. There does not appear to be any evidence of active infection which is great news and overall I am extremely pleased with the fact that again she appears to be ready for discharge. Readmission: 12/11/2019 patient is here for readmission today concerning her bilateral lower extremities. We have not seen her since June 2021. With that being said for the past 2 months apparently she has been having issues with her legs. It was noted by her husband that unfortunately he attempted to call to get an appointment with [place] but was not able to get anything in a sufficient amount of time to get her in sooner due to the fact that again we were having issues with staffing and scheduling. Therefore it was delayed getting him  into the wound care center for his wife. Nonetheless she appears to have evidence of cellulitis today of the bilateral lower extremities. Unfortunately she does not seem to be doing too well she is having a tremendous amount of pain and is asking for pain medication. Obviously it does look like her legs hurt although her wounds appear to be very superficial I am more concerned about the cellulitis aspect of this as opposed to the actual wound openings. 01/06/2020 upon evaluation today patient's legs actually appear to be doing significantly better as compared to last time I saw her when we  were seeing her back for the first evaluation and actually had to send her to the ER for further evaluation. She was admitted to the hospital and was actually there from 12/12/2019 through 12/15/2019. With that being said the patient was placed on IV antibiotics initially, Ancef and IV fluids. Subsequently upon discharge she was given Keflex. With that being said she is getting need an extension of the Keflex as she was only given 3 days in 1 to make sure this completely resolves and does not cause her any additional issues or concerns here. I will send that in for them today. 01/13/2020 upon evaluation today patient appears to be doing decently well in regard to her legs. The areas where she had wounds previously have actually improved which is great news. There does not appear to be any signs of active infection at this time which is great news. With IV NSAIDs she did have some itching on the top of her foot that prompted them to remove the dressing/compression wrap last week because she was just absolutely miserable over the weekend. Nonetheless I do think that we can do some things to try to help out here there may be a little bit of a fungal and for. We are going to treat that accordingly. Electronic Signature(s) Signed: 01/13/2020 2:04:17 PM By: Worthy Keeler PA-C Entered By: Worthy Keeler on 01/13/2020 14:04:16 [name] ([id number]) -------------------------------------------------------------------------------- Physical Exam Details Patient Name: [name], Jillian B. Date of Service: 01/13/2020 1:30 PM Medical Record Number: [id number] Patient Account Number: [id number] Date of Birth/Sex: 25-Jan-1945 (75 y.o. F) Treating RN: Cornell Barman Primary Care Provider: Lelon Huh Other Clinician: Referring Provider: Lelon Huh Treating Provider/Extender: Skipper Cliche in Treatment: 4 Constitutional Well-nourished and well-hydrated in no acute distress. Respiratory normal breathing  without difficulty. Psychiatric this patient is able to make decisions and demonstrates good insight into disease process. Alert and Oriented x 3. pleasant and cooperative. Notes Upon inspection patient's wound bed actually showed signs of good granulation at this time. There does not appear to be any evidence of active infection which is great news and overall very pleased with where things stand. She does have blisters on both legs we are going to need to wrap both legs today as well as the results of this. She is in agreement with that plan. Electronic Signature(s) Signed: 01/13/2020 2:04:41 PM By: Worthy Keeler PA-C Entered By: Worthy Keeler on 01/13/2020 14:04:40 [name] ([id number]) -------------------------------------------------------------------------------- Physician Orders Details Patient Name: [name] Date of Service: 01/13/2020 1:30 PM Medical Record Number: [id number] Patient Account Number: [id number] Date of Birth/Sex: 11/16/1945 (75 y.o. F) Treating RN: Dolan Amen Primary Care Provider: Lelon Huh Other Clinician: Referring Provider: Lelon Huh Treating Provider/Extender: Skipper Cliche in Treatment: 4 Verbal / Phone Orders: No Diagnosis Coding ICD-10 Coding Code Description I89.0 Lymphedema, not elsewhere classified I87.2 Venous insufficiency (chronic) (  peripheral) L03.115 Cellulitis of right lower limb L03.116 Cellulitis of left lower limb Follow-up Appointments o Return Appointment in 1 week. o Nurse Visit as needed Edema Control - Lymphedema / Segmental Compressive Device / Other Wound #21 Left,Lateral Lower Leg o Optional: One layer of unna paste to top of compression wrap (to act as an anchor). o 3 Layer Compression System (Left, Right, Bilateral) for Lymphedema. o DO YOUR BEST to sleep in the bed at night. DO NOT sleep in your recliner. Long hours of sitting in a recliner leads to swelling of the legs and/or  potential wounds on your backside. Wound #22 Right Ankle o Optional: One layer of unna paste to top of compression wrap (to act as an anchor). o 3 Layer Compression System (Left, Right, Bilateral) for Lymphedema. o DO YOUR BEST to sleep in the bed at night. DO NOT sleep in your recliner. Long hours of sitting in a recliner leads to swelling of the legs and/or potential wounds on your backside. Wound Treatment Wound #21 - Lower Leg Wound Laterality: Left, Lateral Cleanser: Byram Ancillary Kit - 15 [name] Supply (Generic) 1 x Per Week/30 Days Discharge Instructions: Use supplies as instructed; Kit contains: (15) Saline Bullets; (15) 3x3 Gauze; 15 pr Gloves Cleanser: Normal Saline (Generic) 1 x Per Week/30 Days Discharge Instructions: Wash your hands with soap and water. Remove old dressing, discard into plastic bag and place into trash. Cleanse the wound with Normal Saline prior to applying a clean dressing using gauze sponges, not tissues or cotton balls. Do not scrub or use excessive force. Pat dry using gauze sponges, not tissue or cotton balls. Peri-Wound Care: Nystatin Cream USP 30 (g) (Generic) 1 x Per Week/30 Days Discharge Instructions: Use Nystatin Cream as directed. Peri-Wound Care: Triamcinolone Acetonide Cream, 0.1%, 15 (g) tube (Generic) 1 x Per Week/30 Days Primary Dressing: Silvercel 4 1/4x 4 1/4 (in/in) (Generic) 1 x Per Week/30 Days Discharge Instructions: Apply Silvercel 4 1/4x 4 1/4 (in/in) as instructed Secondary Dressing: Xtrasorb Medium 4x5 (in/in) (Generic) 1 x Per Week/30 Days Discharge Instructions: Apply to wound as directed. Do not cut. Compression Wrap: Profore Lite LF 3 Multilayer Compression Bandaging System (Generic) 1 x Per Week/30 Days Discharge Instructions: Apply 3 multi-layer wrap as prescribed. Wound #22 - Ankle Wound Laterality: Right Cleanser: Byram Ancillary Kit - 15 [name] Supply (Generic) 1 x Per Week/30 Days Discharge Instructions: Use supplies as  instructed; Kit contains: (15) Saline Bullets; (15) 3x3 Gauze; 15 pr Gloves Cleanser: Normal Saline (Generic) 1 x Per Week/30 Days [name], [name] ([id number]) Discharge Instructions: Wash your hands with soap and water. Remove old dressing, discard into plastic bag and place into trash. Cleanse the wound with Normal Saline prior to applying a clean dressing using gauze sponges, not tissues or cotton balls. Do not scrub or use excessive force. Pat dry using gauze sponges, not tissue or cotton balls. Peri-Wound Care: Nystatin Cream USP 30 (g) (Generic) 1 x Per Week/30 Days Discharge Instructions: Use Nystatin Cream as directed. Peri-Wound Care: Triamcinolone Acetonide Cream, 0.1%, 15 (g) tube (Generic) 1 x Per Week/30 Days Primary Dressing: Silvercel 4 1/4x 4 1/4 (in/in) (Generic) 1 x Per Week/30 Days Discharge Instructions: Apply Silvercel 4 1/4x 4 1/4 (in/in) as instructed Secondary Dressing: Xtrasorb Medium 4x5 (in/in) (Generic) 1 x Per Week/30 Days Discharge Instructions: Apply to wound as directed. Do not cut. Compression Wrap: Profore Lite LF 3 Multilayer Compression Bandaging System (Generic) 1 x Per Week/30 Days Discharge Instructions: Apply 3 multi-layer wrap as  prescribed. Electronic Signature(s) Signed: 01/13/2020 4:32:22 PM By: Worthy Keeler PA-C Signed: 01/13/2020 4:58:00 PM By: Georges Mouse, Minus Breeding RN Entered By: Georges Mouse, Minus Breeding on 01/13/2020 14:02:25 [name] ([id number]) -------------------------------------------------------------------------------- Problem List Details Patient Name: [name], Jillian B. Date of Service: 01/13/2020 1:30 PM Medical Record Number: [id number] Patient Account Number: [id number] Date of Birth/Sex: Jun 05, 1945 (75 y.o. F) Treating RN: Cornell Barman Primary Care Provider: Lelon Huh Other Clinician: Referring Provider: Lelon Huh Treating Provider/Extender: Skipper Cliche in Treatment: 4 Active Problems ICD-10 Encounter Code  Description Active Date MDM Diagnosis I89.0 Lymphedema, not elsewhere classified 12/11/2019 No Yes I87.2 Venous insufficiency (chronic) (peripheral) 12/11/2019 No Yes L03.115 Cellulitis of right lower limb 12/11/2019 No Yes L03.116 Cellulitis of left lower limb 12/11/2019 No Yes Inactive Problems Resolved Problems Electronic Signature(s) Signed: 01/13/2020 1:30:25 PM By: Worthy Keeler PA-C Entered By: Worthy Keeler on 01/13/2020 13:30:23 [name] ([id number]) -------------------------------------------------------------------------------- Progress Note Details Patient Name: Jillian Albe B. Date of Service: 01/13/2020 1:30 PM Medical Record Number: [id number] Patient Account Number: [id number] Date of Birth/Sex: November 03, 1945 (75 y.o. F) Treating RN: Cornell Barman Primary Care Provider: Lelon Huh Other Clinician: Referring Provider: Lelon Huh Treating Provider/Extender: Skipper Cliche in Treatment: 4 Subjective Chief Complaint Information obtained from Patient Patient seen for complaints of Non-Healing Wounds to both lower extremities History of Present Illness (HPI) The following HPI elements were documented for the patient's wound: Location: both lower extremity swelling with ulceration 75 year old patient who sees her PCP Dr. Lelon Huh was recently evaluated 10 days ago for diabetes mellitus, hypertension, CHF and hyperlipidemia. she also was noted to have ulcerations develop in her legs and she has been applying Silvadene dressings locally. In the past she has refused wound care referrals.her cardiologist Dr. Saunders Revel saw her and put her on 40 mg of furosemide daily. last hemoglobin A1c was 7.7%. she was also placed on ciprofloxacin twice daily for 7 days and a urine culture was recommended. past medical history significant for coronary artery disease, diabetes mellitus, nonischemic cardiomyopathy and pulmonary hypertension. She is also status post heart catheterization  and coronary angiography, tubal ligation and breast cyst removal in the past. She has never been a smoker. Patient had arterial studies done which showed bilateral ABIs are artificially elevated due to noncompressible and calcified vessels. Triphasic waveform throughout. Right great toe TBI is elevated while the left is normal. 10/23/2016 -- the patient is rather moribund from several issues including chronic back pain and knee pain and swelling of her legs. The large necrotic area on her left lateral anterior calf was bleeding on touch after washing her leg. There was a spot which needed silver nitrate cauterization and this was done appropriately. 11/06/2016 -- she is again noted to have friable bleeding from the left lower extremity wounds and this again had to be cauterized with silver nitrate to control the bleeding as pressure itself would not do it. 11/20/2016 -- the right lower extremity is completely healed and we have ordered 30-40 mm compression stocking's in both the dural layer and also a pair of juxta lites. 12/18/2016 -- she has not been wearing her compression stockings on her right leg and these have opened out into ulcerations again. Her left lower extremity has circumferential ulcerations now. I believe at this stage I would like to get her venous reflux studies done to make certain that there is no fixable superficial venous reflux. 12/29/2016 -- she has made an excellent recovery having continued with her appropriate doses  of diuretics and elevation and exercise. She has not yet received her right lower extremity juxta lites. Her venous duplex study is at the end of January 01/13/16 on evaluation today patient's wounds appeared to be overall doing much better much less hyper granular than previous week's evaluation. The Hydrofera Blue Dressing's to be doing very well. She does tell me that she is having a little bit more discomfort in the posterior aspect of her leg where she has  a wound at this point. Fortunately there appears to be no infection. 01/19/17 on evaluation today patient appears to be doing very well in regard to her bilateral lower extremity swelling and at this point in time her left lower extremity ulcers. Her wounds appear to be doing much better. She has been tolerating the dressing's at this point fortunately she did get the Juxta-Lite compression as of today as well. Nonetheless I am pleased that she has been tolerating everything so well and that her wounds looks so good. In fact she is an excellent granular surface no evidence of slough covering and I do not see any reason for likely debridement today. Especially in regard to the posterior leg. 01/26/17 on evaluation today patient appears to be doing decently well in regard to her wounds. The surface of the majority of her wounds is greatly improved. She has been using the Juxta-Lite compression which is excellent. Nonetheless the wound of the left posterior calf did require some debridement today otherwise the majority of the wounds did not require any debridement. The this was due to slough buildup on the surface. She also has a small skin flap where two of the ulcers actually connected and this has loosened up I'm concerned that things may not heal well with that flap but for the time being I'm not gonna do anything different in that regard. 02/23/17 on evaluation today patient's wound actually appears to be doing fairly well in regard to the remaining left lateral ulcer which we have been treated. With that being said it does appear that her wrap actually slid down the wood bit over the past few days at least that there's actually some friction injury and blistering noted on the medial aspect of her malleolus of the left lower extremity. She continues to use the Juxta- Lite for the right lower extremity. With that being said no debridement is necessary today and these are very superficial injuries as far  as the new injuries are concerned. 03/02/17 on evaluation today patient appears to be doing excellent in regard to her left lateral lower from the ulcers. In fact it appears that she is almost completely healed in regard to the ulcer we have been following and the new injuries which were caused by the wraps slipping down last week have been completely resolved which is excellent news. Overall I'm definitely pleased with the progress that she has made. Fortunately the rat did not cause any irritation as it did previously with the Unna wrap. 03/09/17 on evaluation today patient's wound appears to be completely healed. Obviously this is great news. She and her husband both are extremely pleased and excited to finally have this gone. Obviously she has been dealing with this wound for quite some time. Readmission: [name], [name] ([id number]) 04/14/2019 upon evaluation today patient appears for reevaluation here in our clinic although it has been since March 2019 since I last saw her. She unfortunately is having issues similar to what she had previous which are blisters and ulcerations of  the bilateral lower extremities. Fortunately there is no evidence of active infection at this time based on what I am seeing. Unfortunately she is still having a lot of drainage and weeping. I do believe she needs to be compression wrap in order to get this under control and she is previously tolerated a 3 layer compression wrap without any complications whatsoever. No fevers, chills, nausea, vomiting, or diarrhea. 04/28/2019 upon evaluation today patient appears to be doing well with regard to the bilateral lower extremities at this time. She has been tolerating the dressing changes without complication. Fortunately there is no signs of active infection at this time. No fevers, chills, nausea, vomiting, or diarrhea. 05/05/2019 upon evaluation today patient appears to be doing excellent in regard to her legs. She has a lot of  new skin growth and overall seems to be progressing quite nicely. Fortunately there is no signs of active infection at this time. No fevers, chills, nausea, vomiting, or diarrhea. 05/12/2019 upon evaluation today patient appears to be doing very well with regard to her lower extremities. She is not completely healed at this point but nonetheless he is doing much better even compared to last week. I am very pleased with where things stand. No fevers, chills, nausea, vomiting, or diarrhea. 05/26/2019 upon evaluation today patient actually appears to be doing worse with regard to her lower extremities. She has green drainage unfortunately is having more pain on the right lower extremity as well. Fortunately there is no signs of active infection systemically which is good news. 06/03/2019 upon evaluation today patient appears to be doing about the same in regard to the fact that she still has wounds on both lower extremities although all wounds are measuring better and appearing much better. She finally did start taking the medication which has made a big difference for her that is the Levaquin. Unfortunately this appears to be making her urinate much more frequently. She has been taking this in the evening therefore it is keeping her up at night. They are wondering if they can switch to taking this during the [name]. I would actually never instructed that they had to take this at night and I think it is definitely fine for her to take it in the morning. She will start that tomorrow. 06/10/2019 upon evaluation today patient appears to be doing excellent in regard to her lower extremities currently. The right lower extremity actually appears to be pretty much healed based on what I am seeing I do not see anything open or draining. The left lower extremity though not completely healed does appear to be doing much better which is great news. Overall there is no signs of active infection at this time. 06/17/2019 upon  evaluation today patient appears to be doing excellent in regard to her ulcers. She has been tolerating the dressing changes without complication in fact everything appears to be almost completely healed today. There is one area on the left lower extremity which appears to potentially still be weeping slightly and I think that we do need to see about wrapping her 1 more week just to make sure everything tightens up before discharge. 06/24/2019 upon evaluation today patient appears to be doing excellent in regard to her bilateral lower extremities. There does not appear to be any evidence of active infection which is great news and overall I am extremely pleased with the fact that again she appears to be ready for discharge. Readmission: 12/11/2019 patient is here for readmission today concerning her  bilateral lower extremities. We have not seen her since June 2021. With that being said for the past 2 months apparently she has been having issues with her legs. It was noted by her husband that unfortunately he attempted to call to get an appointment with [place] but was not able to get anything in a sufficient amount of time to get her in sooner due to the fact that again we were having issues with staffing and scheduling. Therefore it was delayed getting him into the wound care center for his wife. Nonetheless she appears to have evidence of cellulitis today of the bilateral lower extremities. Unfortunately she does not seem to be doing too well she is having a tremendous amount of pain and is asking for pain medication. Obviously it does look like her legs hurt although her wounds appear to be very superficial I am more concerned about the cellulitis aspect of this as opposed to the actual wound openings. 01/06/2020 upon evaluation today patient's legs actually appear to be doing significantly better as compared to last time I saw her when we were seeing her back for the first evaluation and actually had to  send her to the ER for further evaluation. She was admitted to the hospital and was actually there from 12/12/2019 through 12/15/2019. With that being said the patient was placed on IV antibiotics initially, Ancef and IV fluids. Subsequently upon discharge she was given Keflex. With that being said she is getting need an extension of the Keflex as she was only given 3 days in 1 to make sure this completely resolves and does not cause her any additional issues or concerns here. I will send that in for them today. 01/13/2020 upon evaluation today patient appears to be doing decently well in regard to her legs. The areas where she had wounds previously have actually improved which is great news. There does not appear to be any signs of active infection at this time which is great news. With IV NSAIDs she did have some itching on the top of her foot that prompted them to remove the dressing/compression wrap last week because she was just absolutely miserable over the weekend. Nonetheless I do think that we can do some things to try to help out here there may be a little bit of a fungal and for. We are going to treat that accordingly. Objective Constitutional Well-nourished and well-hydrated in no acute distress. Vitals Time Taken: 1:26 PM, Temperature: 97.5 F, Pulse: 63 bpm, Respiratory Rate: 20 breaths/min, Blood Pressure: 179/92 mmHg. [name], [name] ([id number]) Respiratory normal breathing without difficulty. Psychiatric this patient is able to make decisions and demonstrates good insight into disease process. Alert and Oriented x 3. pleasant and cooperative. General Notes: Upon inspection patient's wound bed actually showed signs of good granulation at this time. There does not appear to be any evidence of active infection which is great news and overall very pleased with where things stand. She does have blisters on both legs we are going to need to wrap both legs today as well as the results  of this. She is in agreement with that plan. Integumentary (Hair, Skin) Wound #19 status is Healed - Epithelialized. Original cause of wound was Gradually Appeared. The wound is located on the Left,Anterior Lower Leg. The wound measures 0cm length x 0cm width x 0cm depth; 0cm^2 area and 0cm^3 volume. Wound #20 status is Healed - Epithelialized. Original cause of wound was Gradually Appeared. The wound is located on the  Right,Lateral Lower Leg. The wound measures 0cm length x 0cm width x 0cm depth; 0cm^2 area and 0cm^3 volume. Wound #21 status is Open. Original cause of wound was Gradually Appeared. The wound is located on the Left,Lateral Lower Leg. The wound measures 3cm length x 8cm width x 0.1cm depth; 18.85cm^2 area and 1.885cm^3 volume. There is Fat Layer (Subcutaneous Tissue) exposed. There is no tunneling or undermining noted. There is a medium amount of serosanguineous drainage noted. There is large (67-100%) red, pink granulation within the wound bed. There is a small (1-33%) amount of necrotic tissue within the wound bed including Adherent Slough. Wound #22 status is Open. Original cause of wound was Gradually Appeared. The wound is located on the Right Ankle. The wound measures 4.5cm length x 5cm width x 0.1cm depth; 17.671cm^2 area and 1.767cm^3 volume. There is Fat Layer (Subcutaneous Tissue) exposed. There is no tunneling or undermining noted. There is a medium amount of serosanguineous drainage noted. There is large (67-100%) red, pink granulation within the wound bed. There is a small (1-33%) amount of necrotic tissue within the wound bed including Adherent Slough. Assessment Active Problems ICD-10 Lymphedema, not elsewhere classified Venous insufficiency (chronic) (peripheral) Cellulitis of right lower limb Cellulitis of left lower limb Plan Follow-up Appointments: Return Appointment in 1 week. Nurse Visit as needed Edema Control - Lymphedema / Segmental Compressive Device /  Other: Wound #21 Left,Lateral Lower Leg: Optional: One layer of unna paste to top of compression wrap (to act as an anchor). 3 Layer Compression System (Left, Right, Bilateral) for Lymphedema. DO YOUR BEST to sleep in the bed at night. DO NOT sleep in your recliner. Long hours of sitting in a recliner leads to swelling of the legs and/or potential wounds on your backside. Wound #22 Right Ankle: Optional: One layer of unna paste to top of compression wrap (to act as an anchor). 3 Layer Compression System (Left, Right, Bilateral) for Lymphedema. DO YOUR BEST to sleep in the bed at night. DO NOT sleep in your recliner. Long hours of sitting in a recliner leads to swelling of the legs and/or potential wounds on your backside. WOUND #21: - Lower Leg Wound Laterality: Left, Lateral Cleanser: Byram Ancillary Kit - 15 [name] Supply (Generic) 1 x Per Week/30 Days Discharge Instructions: Use supplies as instructed; Kit contains: (15) Saline Bullets; (15) 3x3 Gauze; 15 pr Gloves Cleanser: Normal Saline (Generic) 1 x Per Week/30 Days Discharge Instructions: Wash your hands with soap and water. Remove old dressing, discard into plastic bag and place into trash. Cleanse the wound with Normal Saline prior to applying a clean dressing using gauze sponges, not tissues or cotton balls. Do not scrub or use excessive force. Pat dry using gauze sponges, not tissue or cotton balls. Peri-Wound Care: Nystatin Cream USP 30 (g) (Generic) 1 x Per Week/30 Days Discharge Instructions: Use Nystatin Cream as directed. Peri-Wound Care: Triamcinolone Acetonide Cream, 0.1%, 15 (g) tube (Generic) 1 x Per Week/30 Days Primary Dressing: Silvercel 4 1/4x 4 1/4 (in/in) (Generic) 1 x Per Week/30 Days Discharge Instructions: Apply Silvercel 4 1/4x 4 1/4 (in/in) as instructed Secondary Dressing: Xtrasorb Medium 4x5 (in/in) (Generic) 1 x Per Week/30 Days Discharge Instructions: Apply to wound as directed. Do not cut. Compression Wrap:  Profore Lite LF 3 Multilayer Compression Bandaging System (Generic) 1 x Per Week/30 Days [name], APONTE ([id number]) Discharge Instructions: Apply 3 multi-layer wrap as prescribed. WOUND #22: - Ankle Wound Laterality: Right Cleanser: Byram Ancillary Kit - 15 [name] Supply (Generic) 1  x Per Week/30 Days Discharge Instructions: Use supplies as instructed; Kit contains: (15) Saline Bullets; (15) 3x3 Gauze; 15 pr Gloves Cleanser: Normal Saline (Generic) 1 x Per Week/30 Days Discharge Instructions: Wash your hands with soap and water. Remove old dressing, discard into plastic bag and place into trash. Cleanse the wound with Normal Saline prior to applying a clean dressing using gauze sponges, not tissues or cotton balls. Do not scrub or use excessive force. Pat dry using gauze sponges, not tissue or cotton balls. Peri-Wound Care: Nystatin Cream USP 30 (g) (Generic) 1 x Per Week/30 Days Discharge Instructions: Use Nystatin Cream as directed. Peri-Wound Care: Triamcinolone Acetonide Cream, 0.1%, 15 (g) tube (Generic) 1 x Per Week/30 Days Primary Dressing: Silvercel 4 1/4x 4 1/4 (in/in) (Generic) 1 x Per Week/30 Days Discharge Instructions: Apply Silvercel 4 1/4x 4 1/4 (in/in) as instructed Secondary Dressing: Xtrasorb Medium 4x5 (in/in) (Generic) 1 x Per Week/30 Days Discharge Instructions: Apply to wound as directed. Do not cut. Compression Wrap: Profore Lite LF 3 Multilayer Compression Bandaging System (Generic) 1 x Per Week/30 Days Discharge Instructions: Apply 3 multi-layer wrap as prescribed. [place] recommend currently that we go out and continue with the wound care measures as before and the patient is in agreement with the plan. This includes the use of the 3 layer compression wrap to the bilateral lower extremities to help with edema control. 2. Also can recommend at this time that we continue to use silver alginate dressing to the open areas/blistered areas. 3. Would also recommend she  continue to elevate her legs as much as possible. 4. I would also recommend a mixture of triamcinolone and nystatin cream to the top of her foot where she has the intense itching I think there may be a little bit of a fungal infection here. We will see patient back for reevaluation in 1 week here in the clinic. If anything worsens or changes patient will contact our office for additional recommendations. Electronic Signature(s) Signed: 01/13/2020 2:05:20 PM By: Worthy Keeler PA-C Entered By: Worthy Keeler on 01/13/2020 14:05:19 [name] ([id number]) -------------------------------------------------------------------------------- SuperBill Details Patient Name: [name] Date of Service: 01/13/2020 Medical Record Number: [id number] Patient Account Number: [id number] Date of Birth/Sex: [birth year] (75 y.o. F) Treating RN: Cornell Barman Primary Care Provider: Lelon Huh Other Clinician: Referring Provider: Lelon Huh Treating Provider/Extender: Skipper Cliche in Treatment: 4 Diagnosis Coding ICD-10 Codes Code Description I89.0 Lymphedema, not elsewhere classified I87.2 Venous insufficiency (chronic) (peripheral) L03.115 Cellulitis of right lower limb L03.116 Cellulitis of left lower limb Facility Procedures CPT4: Description Modifier Quantity Code [id number] BILATERAL: Application of multi-layer venous compression system; leg (below knee), including 1 ankle and foot. Physician Procedures CPT4 Code: 8338250 Description: 53976 - WC PHYS LEVEL 3 - EST PT Modifier: Quantity: 1 CPT4 Code: Description: ICD-10 Diagnosis Description I89.0 Lymphedema, not elsewhere classified I87.2 Venous insufficiency (chronic) (peripheral) L03.115 Cellulitis of right lower limb L03.116 Cellulitis of left lower limb Modifier: Quantity: Electronic Signature(s) Signed: 01/13/2020 4:32:22 PM By: Worthy Keeler PA-C Signed: 01/13/2020 4:58:00 PM By: Charlett Nose RN Previous  Signature: 01/13/2020 2:05:39 PM Version By: Worthy Keeler PA-C Entered By: Georges Mouse, Minus Breeding on 01/13/2020 14:26:11

## 2020-01-13 NOTE — Progress Notes (Signed)
01/14/2020 7:07 PM   [name] [birth year] [id number]  Referring provider: Birdie Sons, MD [address] [place],  [place] [address]  Chief Complaint  Patient presents with  . urge incontinence   Urological history 1. Urge Incontinence - Vesicare effective, but cost prohibitive - Detrol effective, but costs prohibitive - oxybutynin 5 mg BID  2. High risk hematuria - non-smoker - Mar 2018 CT / PET showed a normal GU tract done for MM.She underwent renal US 03/06/2018 which was benign.She underwent cystoscopy for urgency and microscopic hematuria June 07, 2018. She had an ulcerative erythematous area with cloudy urine on the right bladder wall.She took nightly doxycycline and repeat cystoscopy was normal Jul 2020 - UA today is negative for micro heme   HPI: [name] is a 75 year old female who presents today for 6 month follow up with her husband, [name].   Her husband states his wife is at goal with the oxybutynin 5 mg twice daily.  I obtained history from the husband per wife's request.  He states that he has been well pleased with the medication and would like his wife to continue on it.  I asked [name] that she was experiencing any dry eye, constipation, dry mouth or cognitive defects and she stated no.  Her UA is negative for micro heme.  Her PVR is 84 mL.    Patient denies any modifying or aggravating factors.  Patient denies any gross hematuria, dysuria or suprapubic/flank pain.  Patient denies any fevers, chills, nausea or vomiting.   PMH: Past Medical History:  Diagnosis Date  . Aortic stenosis   . Asthma   . Coronary artery disease   . Diabetes mellitus without complication ([place])   . History of measles   . Hypertension   . Nonischemic cardiomyopathy ([place])   . Pulmonary hypertension ([place])     Surgical History: Past Surgical History:  Procedure Laterality Date  . CARDIAC CATHETERIZATION    . Cyst(solitary) of breast:removed    .  RIGHT/LEFT HEART CATH AND CORONARY ANGIOGRAPHY N/A 03/07/2016   Procedure: Right/Left Heart Cath and Coronary Angiography;  Surgeon: Nelva Bush, MD;  Location: [facility];  Service: Cardiovascular;  Laterality: N/A;  . TUBAL LIGATION      Home Medications:  Allergies as of 01/14/2020      Reactions   Hydrochlorothiazide Other (See Comments)   Leg Cramps   Penicillins Rash      Medication List       Accurate as of January 14, 2020 11:59 PM. If you have any questions, ask your nurse or doctor.        albuterol 108 (90 Base) MCG/ACT inhaler Commonly known as: VENTOLIN HFA INHALE 1-2 PUFFS INTO THE LUNGS EVERY 6 (SIX) HOURS AS NEEDED FOR SHORTNESS OF BREATH.   amLODipine 2.5 MG tablet Commonly known as: NORVASC Take 1 tablet (2.5 mg total) by mouth daily.   cephALEXin 500 MG capsule Commonly known as: KEFLEX Take 500 mg by mouth 3 (three) times daily.   collagenase ointment Commonly known as: SANTYL Apply topically daily. Cleanse legs with soap and water and pat dry.  Apply Santyl ointment to scabbed lesions.  Cover with xeroform gauze and wrap with kerlix/tape. Change daily.   HYDROcodone-acetaminophen 10-325 MG tablet Commonly known as: NORCO Take 1 tablet by mouth every 6 (six) hours as needed.   levalbuterol 1.25 MG/3ML nebulizer solution Commonly known as: XOPENEX Take 1.25 mg by nebulization every  8 (eight) hours as needed for wheezing.   metoprolol tartrate 50 MG tablet Commonly known as: LOPRESSOR Take 1.5 tablets (75 mg total) by mouth 2 (two) times daily.   oxybutynin 5 MG tablet Commonly known as: DITROPAN Take 1 tablet (5 mg total) by mouth 2 (two) times daily.   vitamin B-12 500 MCG tablet Commonly known as: CYANOCOBALAMIN Take 250 mcg by mouth daily.   Vitamin D 50 MCG (2000 UT) tablet Take 2,000 Units by mouth daily.       Allergies:  Allergies  Allergen Reactions  . Hydrochlorothiazide Other (See Comments)    Leg Cramps  .  Penicillins Rash    Family History: Family History  Problem Relation Age of Onset  . Cancer Mother        Uterine cancer  . Diabetes Mother   . Heart disease Mother   . Heart failure Mother   . Parkinson's disease Father   . Cancer Sister        breast cancer  . Diabetes Brother        Non-insulin Dependent Diabetes Mellitus  . Mesothelioma Brother   . Hypertension Sister   . Diabetes Sister        Non-insulin dependent Diabetes Mellitus    Social History:  reports that she has never smoked. She has never used smokeless tobacco. She reports that she does not drink alcohol and does not use drugs.  ROS: Pertinent ROS in HPI  Physical Exam: BP (!) 146/95   Pulse 64   Ht 5\' 4"  (1.626 m)   BMI 34.33 kg/m   Constitutional:  Well nourished. Alert and oriented, No acute distress. HEENT: [place] AT, mask in place.  Trachea midline Cardiovascular: No clubbing, cyanosis, or edema. Respiratory: Normal respiratory effort, no increased work of breathing. Neurologic: Grossly intact, no focal deficits, moving all 4 extremities.  In wheel chair.  Psychiatric: Normal mood and affect.   Laboratory Data: Lab Results  Component Value Date   WBC 5.8 12/14/2019   HGB 8.1 (L) 12/14/2019   HCT 26.5 (L) 12/14/2019   MCV 91.4 12/14/2019   PLT 253 12/14/2019    Lab Results  Component Value Date   CREATININE 1.88 (H) 01/28/2020    Lab Results  Component Value Date   HGBA1C 7.4 (H) 12/11/2019       Component Value Date/Time   CHOL 164 09/30/2019 1124   HDL 41 09/30/2019 1124   CHOLHDL 4.0 09/30/2019 1124   LDLCALC 98 09/30/2019 1124    Lab Results  Component Value Date   AST 11 (L) 12/11/2019   Lab Results  Component Value Date   ALT 7 12/11/2019    Urinalysis Component     Latest Ref Rng & Units 01/14/2020  Specific Gravity, UA     1.005 - 1.030 1.025  pH, UA     5.0 - 7.5 5.5  Color, UA     Yellow Yellow  Appearance Ur     Clear Hazy (A)  Leukocytes,UA      Negative Negative  Protein,UA     Negative/Trace 3+ (A)  Glucose, UA     Negative Negative  Ketones, UA     Negative Negative  RBC, UA     Negative 1+ (A)  Bilirubin, UA     Negative Negative  Urobilinogen, Ur     0.2 - 1.0 mg/dL 0.2  Nitrite, UA     Negative Negative  Microscopic Examination      See below:  Component     Latest Ref Rng & Units 01/14/2020  WBC, UA     0 - 5 /hpf 0-5  RBC     0 - 2 /hpf 0-2  Epithelial Cells (non renal)     0 - 10 /hpf 0-10  Bacteria, UA     None seen/Few None seen  Yeast, UA     None seen Present (A)  I have reviewed the labs.   Pertinent Imaging: Results for SHEKINA, CORDELL (MRN [id number]) as of 01/14/2020 14:26  Ref. Range 01/14/2020 13:29  Scan Result Unknown 84     Assessment & Plan:    1. Urge incontinence - at goal with oxybutynin 5 mg BID - continue oxybutynin 5 mg BID - RTC in 6 months for symptom recheck and PVR  2. High risk hematuria - work up completed in 07/2018 - no reports of gross hematuria - UA today is negative  - RTC in 6 months for repeat UA, patient to report any gross heme in the mean time  Return in about 6 months (around 07/13/2020) for PVR and UA .  These notes generated with voice recognition software. I apologize for typographical errors.  Zara Council, PA-C  [facility] [address]  [place], [place] [address] [phone]

## 2020-01-13 NOTE — Progress Notes (Signed)
[name], [name] ([id number]) Visit Report for 01/13/2020 Arrival Information Details Patient Name: [name], [name]. Date of Service: 01/13/2020 1:30 PM Medical Record Number: [id number] Patient Account Number: [id number] Date of Birth/Sex: 1945/05/05 (75 y.o. F) Treating RN: Dolan Amen Primary Care Idriss Quackenbush: Lelon Huh Other Clinician: Referring Jubilee Vivero: Lelon Huh Treating Glade Strausser/Extender: Skipper Cliche in Treatment: 4 Visit Information History Since Last Visit Pain Present Now: No Patient Arrived: Wheel Chair Arrival Time: 13:25 Accompanied By: husband Transfer Assistance: None Patient Identification Verified: Yes Secondary Verification Process Completed: Yes Patient Requires Transmission-Based Precautions: No Patient Has Alerts: No Electronic Signature(s) Signed: 01/13/2020 4:58:00 PM By: Georges Mouse, Minus Breeding RN Entered By: Georges Mouse, Minus Breeding on 01/13/2020 13:26:12 [name] ([id number]) -------------------------------------------------------------------------------- Compression Therapy Details Patient Name: Jillian Albe B. Date of Service: 01/13/2020 1:30 PM Medical Record Number: [id number] Patient Account Number: [id number] Date of Birth/Sex: 02/14/1945 (74 y.o. F) Treating RN: Dolan Amen Primary Care Zyquan Crotty: Lelon Huh Other Clinician: Referring Suda Forbess: Lelon Huh Treating Safi Culotta/Extender: Skipper Cliche in Treatment: 4 Compression Therapy Performed for Wound Assessment: Wound #21 Left,Lateral Lower Leg Performed By: Clinician Dolan Amen, RN Compression Type: Three Layer Post Procedure Diagnosis Same as Pre-procedure Notes pt tolerating wraps well Electronic Signature(s) Signed: 01/13/2020 4:58:00 PM By: Georges Mouse, Minus Breeding RN Entered By: Georges Mouse, Minus Breeding on 01/13/2020 14:25:52 [name] ([id number]) -------------------------------------------------------------------------------- Compression Therapy  Details Patient Name: [name], Jillian B. Date of Service: 01/13/2020 1:30 PM Medical Record Number: [id number] Patient Account Number: [id number] Date of Birth/Sex: 02-Dec-1945 (75 y.o. F) Treating RN: Dolan Amen Primary Care Parminder Cupples: Lelon Huh Other Clinician: Referring Kristyne Woodring: Lelon Huh Treating Kinsleigh Ludolph/Extender: Skipper Cliche in Treatment: 4 Compression Therapy Performed for Wound Assessment: Wound #22 Right Ankle Performed By: Clinician Dolan Amen, RN Compression Type: Three Layer Post Procedure Diagnosis Same as Pre-procedure Notes pt tolerating wraps well Electronic Signature(s) Signed: 01/13/2020 4:58:00 PM By: Georges Mouse, Minus Breeding RN Entered By: Georges Mouse, Minus Breeding on 01/13/2020 14:25:52 [name] ([id number]) -------------------------------------------------------------------------------- Encounter Discharge Information Details Patient Name: [name], Jillian B. Date of Service: 01/13/2020 1:30 PM Medical Record Number: [id number] Patient Account Number: [id number] Date of Birth/Sex: 1945/07/03 (75 y.o. F) Treating RN: Dolan Amen Primary Care Karon Heckendorn: Lelon Huh Other Clinician: Referring Amy Gothard: Lelon Huh Treating Dorleen Kissel/Extender: Skipper Cliche in Treatment: 4 Encounter Discharge Information Items Discharge Condition: Stable Ambulatory Status: Wheelchair Discharge Destination: Home Transportation: Private Auto Accompanied By: husband Schedule Follow-up Appointment: Yes Clinical Summary of Care: Electronic Signature(s) Signed: 01/13/2020 4:58:00 PM By: Georges Mouse, Minus Breeding RN Entered By: Georges Mouse, Minus Breeding on 01/13/2020 14:27:33 [name] ([id number]) -------------------------------------------------------------------------------- Lower Extremity Assessment Details Patient Name: [name], Jillian B. Date of Service: 01/13/2020 1:30 PM Medical Record Number: [id number] Patient Account Number: [id number] Date of  Birth/Sex: 29-Jan-1945 (75 y.o. F) Treating RN: Dolan Amen Primary Care Mirranda Monrroy: Lelon Huh Other Clinician: Referring Ralphael Southgate: Lelon Huh Treating Taseen Marasigan/Extender: Jeri Cos Weeks in Treatment: 4 Edema Assessment Assessed: Shirlyn Goltz: Yes] Patrice Paradise: Yes] Edema: [Left: Yes] [Right: Yes] Calf Left: Right: Point of Measurement: 33 cm From Medial Instep 46 cm 43 cm Ankle Left: Right: Point of Measurement: 8 cm From Medial Instep 25 cm 22 cm Vascular Assessment Pulses: Dorsalis Pedis Palpable: [Left:Yes] [Right:Yes] Electronic Signature(s) Signed: 01/13/2020 4:58:00 PM By: Georges Mouse, Minus Breeding RN Entered By: Georges Mouse, Minus Breeding on 01/13/2020 13:49:52 Bartko, Jeanice Lim ([id number]) -------------------------------------------------------------------------------- Multi Wound Chart Details Patient Name: Jillian Albe B. Date of Service: 01/13/2020 1:30 PM Medical Record Number: [id number] Patient Account Number: [id number] Date of Birth/Sex: [birth year] (  75 y.o. F) Treating RN: Dolan Amen Primary Care Deliana Avalos: Lelon Huh Other Clinician: Referring Vane Yapp: Lelon Huh Treating Yaser Harvill/Extender: Skipper Cliche in Treatment: 4 Vital Signs Height(in): Pulse(bpm): 63 Weight(lbs): Blood Pressure(mmHg): 179/92 Body Mass Index(BMI): Temperature(F): 97.5 Respiratory Rate(breaths/min): 20 Photos: [19:No Photos] [20:No Photos] Wound Location: Left, Anterior Lower Leg Right, Lateral Lower Leg Left, Lateral Lower Leg Wounding Event: Gradually Appeared Gradually Appeared Gradually Appeared Primary Etiology: Lymphedema Lymphedema Venous Leg Ulcer Comorbid History: N/A N/A Lymphedema, Asthma, Congestive Heart Failure, Coronary Artery Disease, Hypertension, Type II Diabetes, Osteoarthritis Date Acquired: 12/17/2019 12/17/2019 01/13/2020 Weeks of Treatment: 1 1 0 Wound Status: Healed - Epithelialized Healed - Epithelialized Open Clustered Wound: No No Yes Clustered  Quantity: N/A N/A 4 Measurements L x W x D (cm) 0x0x0 0x0x0 3x8x0.1 Area (cm) : 0 0 18.85 Volume (cm) : 0 0 1.885 % Reduction in Area: 100.00% 100.00% 0.00% % Reduction in Volume: 100.00% 100.00% 0.00% Classification: Full Thickness Without Exposed Full Thickness Without Exposed Full Thickness Without Exposed Support Structures Support Structures Support Structures Exudate Amount: N/A N/A Medium Exudate Type: N/A N/A Serosanguineous Exudate Color: N/A N/A red, brown Granulation Amount: N/A N/A Large (67-100%) Granulation Quality: N/A N/A Red, Pink Necrotic Amount: N/A N/A Small (1-33%) Epithelialization: N/A N/A None Wound Number: 22 N/A N/A Photos: N/A N/A Wound Location: Right Ankle N/A N/A Wounding Event: Gradually Appeared N/A N/A Primary Etiology: Venous Leg Ulcer N/A N/A [name], [name] ([id number]) Comorbid History: Lymphedema, Asthma, Congestive N/A N/A Heart Failure, Coronary Artery Disease, Hypertension, Type II Diabetes, Osteoarthritis Date Acquired: 01/13/2020 N/A N/A Weeks of Treatment: 0 N/A N/A Wound Status: Open N/A N/A Clustered Wound: Yes N/A N/A Clustered Quantity: 2 N/A N/A Measurements L x W x D (cm) 4.5x5x0.1 N/A N/A Area (cm) : 17.671 N/A N/A Volume (cm) : 1.767 N/A N/A % Reduction in Area: 0.00% N/A N/A % Reduction in Volume: 0.00% N/A N/A Classification: Full Thickness Without Exposed N/A N/A Support Structures Exudate Amount: Medium N/A N/A Exudate Type: Serosanguineous N/A N/A Exudate Color: red, brown N/A N/A Granulation Amount: Large (67-100%) N/A N/A Granulation Quality: Red, Pink N/A N/A Necrotic Amount: Small (1-33%) N/A N/A Exposed Structures: Fat Layer (Subcutaneous Tissue): N/A N/A Yes Fascia: No Tendon: No Muscle: No Joint: No Bone: No Epithelialization: None N/A N/A Treatment Notes Electronic Signature(s) Signed: 01/13/2020 4:58:00 PM By: Georges Mouse, Minus Breeding RN Entered By: Georges Mouse, Minus Breeding on 01/13/2020  13:55:44 [name] ([id number]) -------------------------------------------------------------------------------- [place] Plan Details Patient Name: [name], Jillian B. Date of Service: 01/13/2020 1:30 PM Medical Record Number: [id number] Patient Account Number: [id number] Date of Birth/Sex: Apr 25, 1945 (75 y.o. F) Treating RN: Dolan Amen Primary Care Foy Mungia: Lelon Huh Other Clinician: Referring Naba Sneed: Lelon Huh Treating Daren Doswell/Extender: Skipper Cliche in Treatment: 4 Active Inactive Pain, Acute or Chronic Nursing Diagnoses: Pain, acute or chronic: actual or potential Goals: Patient will verbalize adequate pain control and receive pain control interventions during procedures as needed Date Initiated: 12/11/2019 Target Resolution Date: 01/11/2020 Goal Status: Active Patient/caregiver will verbalize comfort level met Date Initiated: 12/11/2019 Target Resolution Date: 01/11/2020 Goal Status: Active Interventions: Assess comfort goal upon admission Complete pain assessment as per visit requirements Notes: Wound/Skin Impairment Nursing Diagnoses: Impaired tissue integrity Goals: Patient/caregiver will verbalize understanding of skin care regimen Date Initiated: 12/11/2019 Target Resolution Date: 01/11/2020 Goal Status: Active Ulcer/skin breakdown will have a volume reduction of 30% by week 4 Date Initiated: 12/11/2019 Target Resolution Date: 01/11/2020 Goal Status: Active Interventions: Assess patient/caregiver ability to obtain necessary  supplies Assess patient/caregiver ability to perform ulcer/skin care regimen upon admission and as needed Provide education on ulcer and skin care Treatment Activities: Skin care regimen initiated : 12/11/2019 Notes: Electronic Signature(s) Signed: 01/13/2020 4:58:00 PM By: Georges Mouse, Minus Breeding RN Entered By: Georges Mouse, Minus Breeding on 01/13/2020 13:54:55 [name]  ([id number]) -------------------------------------------------------------------------------- Pain Assessment Details Patient Name: [name], Jillian B. Date of Service: 01/13/2020 1:30 PM Medical Record Number: [id number] Patient Account Number: [id number] Date of Birth/Sex: 12-May-1945 (75 y.o. F) Treating RN: Dolan Amen Primary Care Kamica Florance: Lelon Huh Other Clinician: Referring Khloee Garza: Lelon Huh Treating Lilly Gasser/Extender: Skipper Cliche in Treatment: 4 Active Problems Location of Pain Severity and Description of Pain Patient Has Paino No Site Locations Rate the pain. Current Pain Level: 0 Pain Management and Medication Current Pain Management: Electronic Signature(s) Signed: 01/13/2020 4:58:00 PM By: Georges Mouse, Minus Breeding RN Entered By: Georges Mouse, Minus Breeding on 01/13/2020 13:33:30 [name] ([id number]) -------------------------------------------------------------------------------- Patient/Caregiver Education Details Patient Name: [name] Date of Service: 01/13/2020 1:30 PM Medical Record Number: [id number] Patient Account Number: [id number] Date of Birth/Gender: [birth year] (75 y.o. F) Treating RN: Dolan Amen Primary Care Physician: Lelon Huh Other Clinician: Referring Physician: Lelon Huh Treating Physician/Extender: Skipper Cliche in Treatment: 4 Education Assessment Education Provided To: Patient and Caregiver Education Topics Provided Notes reinforced importance/purpose of compression wraps Electronic Signature(s) Signed: 01/13/2020 4:58:00 PM By: Georges Mouse, Minus Breeding RN Entered By: Georges Mouse, Minus Breeding on 01/13/2020 14:27:00 [name] ([id number]) -------------------------------------------------------------------------------- Wound Assessment Details Patient Name: [name], Jillian B. Date of Service: 01/13/2020 1:30 PM Medical Record Number: [id number] Patient Account Number: [id number] Date of Birth/Sex: [birth year]  (75 y.o. F) Treating RN: Dolan Amen Primary Care Narya Beavin: Lelon Huh Other Clinician: Referring Shakeita Vandevander: Lelon Huh Treating Perina Salvaggio/Extender: Skipper Cliche in Treatment: 4 Wound Status Wound Number: 19 Primary Etiology: Lymphedema Wound Location: Left, Anterior Lower Leg Wound Status: Healed - Epithelialized Wounding Event: Gradually Appeared Date Acquired: 12/17/2019 Weeks Of Treatment: 1 Clustered Wound: No Photos Photo Uploaded By: Georges Mouse, Minus Breeding on 01/13/2020 17:06:30 Wound Measurements Length: (cm) Width: (cm) Depth: (cm) Area: (cm) Volume: (cm) 0 % Reduction in Area: 100% 0 % Reduction in Volume: 100% 0 0 0 Wound Description Classification: Full Thickness Without Exposed Support Structu res Treatment Notes Wound #19 (Lower Leg) Wound Laterality: Left, Anterior Cleanser Peri-Wound Care Topical Primary Dressing Secondary Dressing Secured With Compression Wrap Compression Stockings Add-Ons Electronic Signature(s) Signed: 01/13/2020 4:58:00 PM By: Georges Mouse, Minus Breeding RN Camargito, Jeanice Lim ([id number]) Entered By: Georges Mouse, Minus Breeding on 01/13/2020 13:44:16 [name] ([id number]) -------------------------------------------------------------------------------- Wound Assessment Details Patient Name: [name], TELLEFSEN B. Date of Service: 01/13/2020 1:30 PM Medical Record Number: [id number] Patient Account Number: [id number] Date of Birth/Sex: 1945-09-22 (75 y.o. F) Treating RN: Dolan Amen Primary Care Bernd Crom: Lelon Huh Other Clinician: Referring Davieon Stockham: Lelon Huh Treating Marvella Jenning/Extender: Skipper Cliche in Treatment: 4 Wound Status Wound Number: 20 Primary Etiology: Lymphedema Wound Location: Right, Lateral Lower Leg Wound Status: Healed - Epithelialized Wounding Event: Gradually Appeared Date Acquired: 12/17/2019 Weeks Of Treatment: 1 Clustered Wound: No Wound Measurements Length: (cm) Width:  (cm) Depth: (cm) Area: (cm) Volume: (cm) 0 % Reduction in Area: 100% 0 % Reduction in Volume: 100% 0 0 0 Wound Description Classification: Full Thickness Without Exposed Support Structu res Treatment Notes Wound #20 (Lower Leg) Wound Laterality: Right, Lateral Cleanser Peri-Wound Care Topical Primary Dressing Secondary Dressing Secured With Compression Wrap Compression Stockings Add-Ons Electronic Signature(s) Signed: 01/13/2020 4:58:00 PM By: Charlett Nose RN Entered  By: Georges Mouse, Minus Breeding on 01/13/2020 13:44:16 [name] ([id number]) -------------------------------------------------------------------------------- Wound Assessment Details Patient Name: RAVEENA, HEBDON B. Date of Service: 01/13/2020 1:30 PM Medical Record Number: [id number] Patient Account Number: [id number] Date of Birth/Sex: Sep 06, 1945 (75 y.o. F) Treating RN: Dolan Amen Primary Care Dulcinea Kinser: Lelon Huh Other Clinician: Referring Christelle Igoe: Lelon Huh Treating Normalee Sistare/Extender: Skipper Cliche in Treatment: 4 Wound Status Wound Number: 21 Primary Venous Leg Ulcer Etiology: Wound Location: Left, Lateral Lower Leg Wound Open Wounding Event: Gradually Appeared Status: Date Acquired: 01/13/2020 Comorbid Lymphedema, Asthma, Congestive Heart Failure, Weeks Of Treatment: 0 History: Coronary Artery Disease, Hypertension, Type II Diabetes, Clustered Wound: Yes Osteoarthritis Photos Wound Measurements Length: (cm) 3 Width: (cm) 8 Depth: (cm) 0.1 Clustered Quantity: 4 Area: (cm) 18.85 Volume: (cm) 1.885 % Reduction in Area: 0% % Reduction in Volume: 0% Epithelialization: None Tunneling: No Undermining: No Wound Description Classification: Full Thickness Without Exposed Support Structures Exudate Amount: Medium Exudate Type: Serosanguineous Exudate Color: red, brown Foul Odor After Cleansing: No Slough/Fibrino Yes Wound Bed Granulation Amount: Large (67-100%)  Exposed Structure Granulation Quality: Red, Pink Fascia Exposed: No Necrotic Amount: Small (1-33%) Fat Layer (Subcutaneous Tissue) Exposed: Yes Necrotic Quality: Adherent Slough Tendon Exposed: No Muscle Exposed: No Joint Exposed: No Bone Exposed: No Treatment Notes Wound #21 (Lower Leg) Wound Laterality: Left, Lateral Cleanser Byram Ancillary Kit - 15 Day Supply Discharge Instruction: Use supplies as instructed; Kit contains: (15) Saline Bullets; (15) 3x3 Gauze; 15 pr Gloves Normal Saline MASHONDA, BROSKI ([id number]) Discharge Instruction: Wash your hands with soap and water. Remove old dressing, discard into plastic bag and place into trash. Cleanse the wound with Normal Saline prior to applying a clean dressing using gauze sponges, not tissues or cotton balls. Do not scrub or use excessive force. Pat dry using gauze sponges, not tissue or cotton balls. Peri-Wound Care Nystatin Cream USP 30 (g) Discharge Instruction: Use Nystatin Cream as directed. Triamcinolone Acetonide Cream, 0.1%, 15 (g) tube Topical Primary Dressing Silvercel 4 1/4x 4 1/4 (in/in) Discharge Instruction: Apply Silvercel 4 1/4x 4 1/4 (in/in) as instructed Secondary Dressing Xtrasorb Medium 4x5 (in/in) Discharge Instruction: Apply to wound as directed. Do not cut. Secured With Compression Wrap Profore Lite LF 3 Multilayer Compression Bandaging System Discharge Instruction: Apply 3 multi-layer wrap as prescribed. Compression Stockings Add-Ons Electronic Signature(s) Signed: 01/13/2020 4:58:00 PM By: Georges Mouse, Minus Breeding RN Entered By: Georges Mouse, Minus Breeding on 01/13/2020 13:46:56 Clayton, Jeanice Lim ([id number]) -------------------------------------------------------------------------------- Wound Assessment Details Patient Name: ADAISHA, CAMPISE B. Date of Service: 01/13/2020 1:30 PM Medical Record Number: [id number] Patient Account Number: [id number] Date of Birth/Sex: 1945-05-10 (75 y.o. F) Treating RN:  Dolan Amen Primary Care Kalum Minner: Lelon Huh Other Clinician: Referring Tanvir Hipple: Lelon Huh Treating Wreatha Sturgeon/Extender: Skipper Cliche in Treatment: 4 Wound Status Wound Number: 22 Primary Venous Leg Ulcer Etiology: Wound Location: Right Ankle Wound Open Wounding Event: Gradually Appeared Status: Date Acquired: 01/13/2020 Comorbid Lymphedema, Asthma, Congestive Heart Failure, Weeks Of Treatment: 0 History: Coronary Artery Disease, Hypertension, Type II Diabetes, Clustered Wound: Yes Osteoarthritis Photos Wound Measurements Length: (cm) 4.5 Width: (cm) 5 Depth: (cm) 0.1 Clustered Quantity: 2 Area: (cm) 17.671 Volume: (cm) 1.767 % Reduction in Area: 0% % Reduction in Volume: 0% Epithelialization: None Tunneling: No Undermining: No Wound Description Classification: Full Thickness Without Exposed Support Structures Exudate Amount: Medium Exudate Type: Serosanguineous Exudate Color: red, brown Foul Odor After Cleansing: No Slough/Fibrino Yes Wound Bed Granulation Amount: Large (67-100%) Exposed Structure Granulation Quality: Red, Pink Fascia Exposed: No Necrotic Amount: Small (1-33%)  Fat Layer (Subcutaneous Tissue) Exposed: Yes Necrotic Quality: Adherent Slough Tendon Exposed: No Muscle Exposed: No Joint Exposed: No Bone Exposed: No Treatment Notes Wound #22 ([place]) Wound Laterality: Right Cleanser Byram Ancillary Kit - 15 Day Supply Discharge Instruction: Use supplies as instructed; Kit contains: (15) Saline Bullets; (15) 3x3 Gauze; 15 pr Gloves Normal Saline NAMIYAH, GRANTHAM ([id number]) Discharge Instruction: Wash your hands with soap and water. Remove old dressing, discard into plastic bag and place into trash. Cleanse the wound with Normal Saline prior to applying a clean dressing using gauze sponges, not tissues or cotton balls. Do not scrub or use excessive force. Pat dry using gauze sponges, not tissue or cotton balls. Peri-Wound  Care Nystatin Cream USP 30 (g) Discharge Instruction: Use Nystatin Cream as directed. Triamcinolone Acetonide Cream, 0.1%, 15 (g) tube Topical Primary Dressing Silvercel 4 1/4x 4 1/4 (in/in) Discharge Instruction: Apply Silvercel 4 1/4x 4 1/4 (in/in) as instructed Secondary Dressing Xtrasorb Medium 4x5 (in/in) Discharge Instruction: Apply to wound as directed. Do not cut. Secured With Compression Wrap Profore Lite LF 3 Multilayer Compression Bandaging System Discharge Instruction: Apply 3 multi-layer wrap as prescribed. Compression Stockings Add-Ons Electronic Signature(s) Signed: 01/13/2020 4:58:00 PM By: Georges Mouse, Minus Breeding RN Entered By: Georges Mouse, Minus Breeding on 01/13/2020 13:47:41 [name] ([id number]) -------------------------------------------------------------------------------- [place] Details Patient Name: [name] Date of Service: 01/13/2020 1:30 PM Medical Record Number: [id number] Patient Account Number: [id number] Date of Birth/Sex: [birth year] (75 y.o. F) Treating RN: Dolan Amen Primary Care Celie Desrochers: Lelon Huh Other Clinician: Referring Clary Boulais: Lelon Huh Treating Lanyiah Brix/Extender: Skipper Cliche in Treatment: 4 Vital Signs Time Taken: 13:26 Temperature (F): 97.5 Pulse (bpm): 63 Respiratory Rate (breaths/min): 20 Blood Pressure (mmHg): 179/92 Reference Range: 80 - 120 mg / dl Electronic Signature(s) Signed: 01/13/2020 4:58:00 PM By: Georges Mouse, Minus Breeding RN Entered By: Georges Mouse, Minus Breeding on 01/13/2020 13:29:23

## 2020-01-14 ENCOUNTER — Ambulatory Visit (INDEPENDENT_AMBULATORY_CARE_PROVIDER_SITE_OTHER): Payer: Medicare Other | Admitting: Urology

## 2020-01-14 ENCOUNTER — Encounter: Payer: Self-pay | Admitting: Urology

## 2020-01-14 VITALS — BP 146/95 | HR 64 | Ht 64.0 in

## 2020-01-14 DIAGNOSIS — N3941 Urge incontinence: Secondary | ICD-10-CM

## 2020-01-14 DIAGNOSIS — R319 Hematuria, unspecified: Secondary | ICD-10-CM | POA: Diagnosis not present

## 2020-01-14 LAB — BLADDER SCAN AMB NON-IMAGING: Scan Result: 84

## 2020-01-15 LAB — MICROSCOPIC EXAMINATION: Bacteria, UA: NONE SEEN

## 2020-01-15 LAB — URINALYSIS, COMPLETE
Bilirubin, UA: NEGATIVE
Glucose, UA: NEGATIVE
Ketones, UA: NEGATIVE
Leukocytes,UA: NEGATIVE
Nitrite, UA: NEGATIVE
Specific Gravity, UA: 1.025 (ref 1.005–1.030)
Urobilinogen, Ur: 0.2 mg/dL (ref 0.2–1.0)
pH, UA: 5.5 (ref 5.0–7.5)

## 2020-01-20 ENCOUNTER — Encounter: Payer: Medicare Other | Admitting: Physician Assistant

## 2020-01-20 ENCOUNTER — Other Ambulatory Visit: Payer: Self-pay

## 2020-01-20 DIAGNOSIS — I2510 Atherosclerotic heart disease of native coronary artery without angina pectoris: Secondary | ICD-10-CM | POA: Diagnosis not present

## 2020-01-20 DIAGNOSIS — I272 Pulmonary hypertension, unspecified: Secondary | ICD-10-CM | POA: Diagnosis not present

## 2020-01-20 DIAGNOSIS — I890 Lymphedema, not elsewhere classified: Secondary | ICD-10-CM | POA: Diagnosis not present

## 2020-01-20 DIAGNOSIS — L03116 Cellulitis of left lower limb: Secondary | ICD-10-CM | POA: Diagnosis not present

## 2020-01-20 DIAGNOSIS — I872 Venous insufficiency (chronic) (peripheral): Secondary | ICD-10-CM | POA: Diagnosis not present

## 2020-01-20 DIAGNOSIS — I428 Other cardiomyopathies: Secondary | ICD-10-CM | POA: Diagnosis not present

## 2020-01-20 DIAGNOSIS — I110 Hypertensive heart disease with heart failure: Secondary | ICD-10-CM | POA: Diagnosis not present

## 2020-01-20 DIAGNOSIS — L97322 Non-pressure chronic ulcer of left ankle with fat layer exposed: Secondary | ICD-10-CM | POA: Diagnosis not present

## 2020-01-20 DIAGNOSIS — E119 Type 2 diabetes mellitus without complications: Secondary | ICD-10-CM | POA: Diagnosis not present

## 2020-01-20 DIAGNOSIS — I509 Heart failure, unspecified: Secondary | ICD-10-CM | POA: Diagnosis not present

## 2020-01-20 DIAGNOSIS — L03115 Cellulitis of right lower limb: Secondary | ICD-10-CM | POA: Diagnosis not present

## 2020-01-20 DIAGNOSIS — L97822 Non-pressure chronic ulcer of other part of left lower leg with fat layer exposed: Secondary | ICD-10-CM | POA: Diagnosis not present

## 2020-01-20 NOTE — Progress Notes (Addendum)
[name], [name] ([id number]) Visit Report for 01/20/2020 Chief Complaint Document Details Patient Name: [name], [name]. Date of Service: 01/20/2020 3:45 PM Medical Record Number: [id number] Patient Account Number: [id number] Date of Birth/Sex: 1945-05-24 (75 y.o. F) Treating RN: Cornell Barman Primary Care Provider: Lelon Huh Other Clinician: Referring Provider: Lelon Huh Treating Provider/Extender: Skipper Cliche in Treatment: 5 Information Obtained from: Patient Chief Complaint Patient seen for complaints of Non-Healing Wounds to both lower extremities Electronic Signature(s) Signed: 01/20/2020 4:53:54 PM By: Worthy Keeler PA-C Entered By: Worthy Keeler on 01/20/2020 16:53:54 Brucks, [name] ([id number]) -------------------------------------------------------------------------------- HPI Details Patient Name: [name] Date of Service: 01/20/2020 3:45 PM Medical Record Number: [id number] Patient Account Number: [id number] Date of Birth/Sex: 10/31/1945 (75 y.o. F) Treating RN: Cornell Barman Primary Care Provider: Lelon Huh Other Clinician: Referring Provider: Lelon Huh Treating Provider/Extender: Skipper Cliche in Treatment: 5 History of Present Illness Location: both lower extremity swelling with ulceration HPI Description: 75 year old patient who sees her PCP Dr. Lelon Huh was recently evaluated 10 days ago for diabetes mellitus, hypertension, CHF and hyperlipidemia. she also was noted to have ulcerations develop in her legs and she has been applying Silvadene dressings locally. In the past she has refused wound care referrals.her cardiologist Dr. Saunders Revel saw her and put her on 40 mg of furosemide daily. last hemoglobin A1c was 7.7%. she was also placed on ciprofloxacin twice daily for 7 days and a urine culture was recommended. past medical history significant for coronary artery disease, diabetes mellitus, nonischemic cardiomyopathy and pulmonary  hypertension. She is also status post heart catheterization and coronary angiography, tubal ligation and breast cyst removal in the past. She has never been a smoker. Patient had arterial studies done which showed bilateral ABIs are artificially elevated due to noncompressible and calcified vessels. Triphasic waveform throughout. Right great toe TBI is elevated while the left is normal. 10/23/2016 -- the patient is rather moribund from several issues including chronic back pain and knee pain and swelling of her legs. The large necrotic area on her left lateral anterior calf was bleeding on touch after washing her leg. There was a spot which needed silver nitrate cauterization and this was done appropriately. 11/06/2016 -- she is again noted to have friable bleeding from the left lower extremity wounds and this again had to be cauterized with silver nitrate to control the bleeding as pressure itself would not do it. 11/20/2016 -- the right lower extremity is completely healed and we have ordered 30-40 mm compression stocking's in both the dural layer and also a pair of juxta lites. 12/18/2016 -- she has not been wearing her compression stockings on her right leg and these have opened out into ulcerations again. Her left lower extremity has circumferential ulcerations now. I believe at this stage I would like to get her venous reflux studies done to make certain that there is no fixable superficial venous reflux. 12/29/2016 -- she has made an excellent recovery having continued with her appropriate doses of diuretics and elevation and exercise. She has not yet received her right lower extremity juxta lites. Her venous duplex study is at the end of January 01/13/16 on evaluation today patient's wounds appeared to be overall doing much better much less hyper granular than previous week's evaluation. The Hydrofera Blue Dressing's to be doing very well. She does tell me that she is having a little bit more  discomfort in the posterior aspect of her leg where she has a wound at this  point. Fortunately there appears to be no infection. 01/19/17 on evaluation today patient appears to be doing very well in regard to her bilateral lower extremity swelling and at this point in time her left lower extremity ulcers. Her wounds appear to be doing much better. She has been tolerating the dressing's at this point fortunately she did get the Juxta-Lite compression as of today as well. Nonetheless I am pleased that she has been tolerating everything so well and that her wounds looks so good. In fact she is an excellent granular surface no evidence of slough covering and I do not see any reason for likely debridement today. Especially in regard to the posterior leg. 01/26/17 on evaluation today patient appears to be doing decently well in regard to her wounds. The surface of the majority of her wounds is greatly improved. She has been using the Juxta-Lite compression which is excellent. Nonetheless the wound of the left posterior calf did require some debridement today otherwise the majority of the wounds did not require any debridement. The this was due to slough buildup on the surface. She also has a small skin flap where two of the ulcers actually connected and this has loosened up I'm concerned that things may not heal well with that flap but for the time being I'm not gonna do anything different in that regard. 02/23/17 on evaluation today patient's wound actually appears to be doing fairly well in regard to the remaining left lateral ulcer which we have been treated. With that being said it does appear that her wrap actually slid down the wood bit over the past few days at least that there's actually some friction injury and blistering noted on the medial aspect of her malleolus of the left lower extremity. She continues to use the Juxta- Lite for the right lower extremity. With that being said no debridement is  necessary today and these are very superficial injuries as far as the new injuries are concerned. 03/02/17 on evaluation today patient appears to be doing excellent in regard to her left lateral lower from the ulcers. In fact it appears that she is almost completely healed in regard to the ulcer we have been following and the new injuries which were caused by the wraps slipping down last week have been completely resolved which is excellent news. Overall I'm definitely pleased with the progress that she has made. Fortunately the rat did not cause any irritation as it did previously with the Unna wrap. 03/09/17 on evaluation today patient's wound appears to be completely healed. Obviously this is great news. She and her husband both are extremely pleased and excited to finally have this gone. Obviously she has been dealing with this wound for quite some time. Readmission: 04/14/2019 upon evaluation today patient appears for reevaluation here in our clinic although it has been since March 2019 since I last saw her. She unfortunately is having issues similar to what she had previous which are blisters and ulcerations of the bilateral lower extremities. Fortunately there is no evidence of active infection at this time based on what I am seeing. Unfortunately she is still having a lot of drainage and weeping. I do believe she needs to be compression wrap in order to get this under control and she is previously tolerated a 3 layer compression wrap without any complications whatsoever. No fevers, chills, nausea, vomiting, or diarrhea. [name], [name] ([id number]) 04/28/2019 upon evaluation today patient appears to be doing well with regard to the bilateral lower  extremities at this time. She has been tolerating the dressing changes without complication. Fortunately there is no signs of active infection at this time. No fevers, chills, nausea, vomiting, or diarrhea. 05/05/2019 upon evaluation today patient appears  to be doing excellent in regard to her legs. She has a lot of new skin growth and overall seems to be progressing quite nicely. Fortunately there is no signs of active infection at this time. No fevers, chills, nausea, vomiting, or diarrhea. 05/12/2019 upon evaluation today patient appears to be doing very well with regard to her lower extremities. She is not completely healed at this point but nonetheless he is doing much better even compared to last week. I am very pleased with where things stand. No fevers, chills, nausea, vomiting, or diarrhea. 05/26/2019 upon evaluation today patient actually appears to be doing worse with regard to her lower extremities. She has green drainage unfortunately is having more pain on the right lower extremity as well. Fortunately there is no signs of active infection systemically which is good news. 06/03/2019 upon evaluation today patient appears to be doing about the same in regard to the fact that she still has wounds on both lower extremities although all wounds are measuring better and appearing much better. She finally did start taking the medication which has made a big difference for her that is the Levaquin. Unfortunately this appears to be making her urinate much more frequently. She has been taking this in the evening therefore it is keeping her up at night. They are wondering if they can switch to taking this during the day. I would actually never instructed that they had to take this at night and I think it is definitely fine for her to take it in the morning. She will start that tomorrow. 06/10/2019 upon evaluation today patient appears to be doing excellent in regard to her lower extremities currently. The right lower extremity actually appears to be pretty much healed based on what I am seeing I do not see anything open or draining. The left lower extremity though not completely healed does appear to be doing much better which is great news. Overall there  is no signs of active infection at this time. 06/17/2019 upon evaluation today patient appears to be doing excellent in regard to her ulcers. She has been tolerating the dressing changes without complication in fact everything appears to be almost completely healed today. There is one area on the left lower extremity which appears to potentially still be weeping slightly and I think that we do need to see about wrapping her 1 more week just to make sure everything tightens up before discharge. 06/24/2019 upon evaluation today patient appears to be doing excellent in regard to her bilateral lower extremities. There does not appear to be any evidence of active infection which is great news and overall I am extremely pleased with the fact that again she appears to be ready for discharge. Readmission: 12/11/2019 patient is here for readmission today concerning her bilateral lower extremities. We have not seen her since June 2021. With that being said for the past 2 months apparently she has been having issues with her legs. It was noted by her husband that unfortunately he attempted to call to get an appointment with [place] but was not able to get anything in a sufficient amount of time to get her in sooner due to the fact that again we were having issues with staffing and scheduling. Therefore it was delayed getting him  into the wound care center for his wife. Nonetheless she appears to have evidence of cellulitis today of the bilateral lower extremities. Unfortunately she does not seem to be doing too well she is having a tremendous amount of pain and is asking for pain medication. Obviously it does look like her legs hurt although her wounds appear to be very superficial I am more concerned about the cellulitis aspect of this as opposed to the actual wound openings. 01/06/2020 upon evaluation today patient's legs actually appear to be doing significantly better as compared to last time I saw her when we  were seeing her back for the first evaluation and actually had to send her to the ER for further evaluation. She was admitted to the hospital and was actually there from 12/12/2019 through 12/15/2019. With that being said the patient was placed on IV antibiotics initially, Ancef and IV fluids. Subsequently upon discharge she was given Keflex. With that being said she is getting need an extension of the Keflex as she was only given 3 days in 1 to make sure this completely resolves and does not cause her any additional issues or concerns here. I will send that in for them today. 01/13/2020 upon evaluation today patient appears to be doing decently well in regard to her legs. The areas where she had wounds previously have actually improved which is great news. There does not appear to be any signs of active infection at this time which is great news. With IV NSAIDs she did have some itching on the top of her foot that prompted them to remove the dressing/compression wrap last week because she was just absolutely miserable over the weekend. Nonetheless I do think that we can do some things to try to help out here there may be a little bit of a fungal and for. We are going to treat that accordingly. 01/20/2020 upon evaluation today patient actually appears to be doing well with regard to her legs. In general I am very pleased with how things seem to be progressing. I do not see any signs of active infection at this time which is great news. Overall I think the patient is pleased as well she is not really having any pain and she tells me that itching is greatly improved Electronic Signature(s) Signed: 01/20/2020 5:04:32 PM By: Worthy Keeler PA-C Entered By: Worthy Keeler on 01/20/2020 17:04:32 [name] ([id number]) -------------------------------------------------------------------------------- Physical Exam Details Patient Name: LASHEKA, KEMPNER B. Date of Service: 01/20/2020 3:45 PM Medical Record  Number: [id number] Patient Account Number: [id number] Date of Birth/Sex: 06-04-1945 (75 y.o. F) Treating RN: Cornell Barman Primary Care Provider: Lelon Huh Other Clinician: Referring Provider: Lelon Huh Treating Provider/Extender: Skipper Cliche in Treatment: 5 Constitutional Obese and well-hydrated in no acute distress. Respiratory normal breathing without difficulty. Psychiatric this patient is able to make decisions and demonstrates good insight into disease process. Alert and Oriented x 3. pleasant and cooperative. Notes Upon inspection patient's wound bed actually showed signs of excellent granulation and epithelization at all wound locations. I really feel like she is making excellent progress since even last week and I am very pleased in that regard. There is no evidence of infection right now. Electronic Signature(s) Signed: 01/20/2020 5:04:49 PM By: Worthy Keeler PA-C Entered By: Worthy Keeler on 01/20/2020 17:04:49 [name] ([id number]) -------------------------------------------------------------------------------- Physician Orders Details Patient Name: [name], Jillian B. Date of Service: 01/20/2020 3:45 PM Medical Record Number: [id number] Patient Account Number: [id number] Date of  Birth/Sex: 1945-05-19 (74 y.o. F) Treating RN: Cornell Barman Primary Care Provider: Lelon Huh Other Clinician: Referring Provider: Lelon Huh Treating Provider/Extender: Skipper Cliche in Treatment: 5 Verbal / Phone Orders: No Diagnosis Coding ICD-10 Coding Code Description I89.0 Lymphedema, not elsewhere classified I87.2 Venous insufficiency (chronic) (peripheral) L03.115 Cellulitis of right lower limb L03.116 Cellulitis of left lower limb Follow-up Appointments o Return Appointment in 1 week. o Nurse Visit as needed Bathing/ Shower/ Hygiene o May shower with wound dressing protected with water repellent cover or cast protector. Edema Control - Lymphedema /  Segmental Compressive Device / Other Wound #21 Left,Lateral Lower Leg o Optional: One layer of unna paste to top of compression wrap (to act as an anchor). o 3 Layer Compression System (Left, Right, Bilateral) for Lymphedema. o DO YOUR BEST to sleep in the bed at night. DO NOT sleep in your recliner. Long hours of sitting in a recliner leads to swelling of the legs and/or potential wounds on your backside. Wound #22 Right Ankle o Optional: One layer of unna paste to top of compression wrap (to act as an anchor). o 3 Layer Compression System (Left, Right, Bilateral) for Lymphedema. o DO YOUR BEST to sleep in the bed at night. DO NOT sleep in your recliner. Long hours of sitting in a recliner leads to swelling of the legs and/or potential wounds on your backside. Wound Treatment Wound #21 - Lower Leg Wound Laterality: Left, Lateral Cleanser: Byram Ancillary Kit - 15 Day Supply Discharge Instructions: Use supplies as instructed; Kit contains: (15) Saline Bullets; (15) 3x3 Gauze; 15 pr Gloves Cleanser: Normal Saline Discharge Instructions: Wash your hands with soap and water. Remove old dressing, discard into plastic bag and place into trash. Cleanse the wound with Normal Saline prior to applying a clean dressing using gauze sponges, not tissues or cotton balls. Do not scrub or use excessive force. Pat dry using gauze sponges, not tissue or cotton balls. Peri-Wound Care: Triamcinolone Acetonide Cream, 0.1%, 15 (g) tube Primary Dressing: Gauze Discharge Instructions: As directed: dry, moistened with saline or moistened with Dakins Solution Compression Wrap: Profore Lite LF 3 Multilayer Compression Bandaging System Discharge Instructions: Apply 3 multi-layer wrap as prescribed. Wound #22 - Ankle Wound Laterality: Right Cleanser: Normal Saline (Generic) 1 x Per Week/30 Days Discharge Instructions: Wash your hands with soap and water. Remove old dressing, discard into plastic bag and  place into trash. Cleanse the wound with Normal Saline prior to applying a clean dressing using gauze sponges, not tissues or cotton balls. Do not scrub or use excessive force. Pat dry using gauze sponges, not tissue or cotton balls. Peri-Wound Care: Triamcinolone Acetonide Cream, 0.1%, 15 (g) tube (Generic) 1 x Per Week/30 Days Primary Dressing: Gauze (Generic) 1 x Per Week/30 Days [name], [name] ([id number]) Discharge Instructions: As directed: dry, moistened with saline or moistened with Dakins Solution Compression Wrap: Profore Lite LF 3 Multilayer Compression Bandaging System (Generic) 1 x Per Week/30 Days Discharge Instructions: Apply 3 multi-layer wrap as prescribed. Wound #23 - Ankle Wound Laterality: Left, Medial Cleanser: Byram Ancillary Kit - 15 Day Supply (Generic) 1 x Per Week/30 Days Discharge Instructions: Use supplies as instructed; Kit contains: (15) Saline Bullets; (15) 3x3 Gauze; 15 pr Gloves Cleanser: Normal Saline (Generic) 1 x Per Week/30 Days Discharge Instructions: Wash your hands with soap and water. Remove old dressing, discard into plastic bag and place into trash. Cleanse the wound with Normal Saline prior to applying a clean dressing using gauze sponges, not tissues  or cotton balls. Do not scrub or use excessive force. Pat dry using gauze sponges, not tissue or cotton balls. Peri-Wound Care: Triamcinolone Acetonide Cream, 0.1%, 15 (g) tube (Generic) 1 x Per Week/30 Days Primary Dressing: Gauze (Generic) 1 x Per Week/30 Days Discharge Instructions: As directed: dry, moistened with saline or moistened with Dakins Solution Compression Wrap: Profore Lite LF 3 Multilayer Compression Bandaging System (Generic) 1 x Per Week/30 Days Discharge Instructions: Apply 3 multi-layer wrap as prescribed. Electronic Signature(s) Signed: 01/22/2020 8:41:15 AM By: Gretta Cool, BSN, RN, CWS, Kim RN, BSN Signed: 01/22/2020 6:06:24 PM By: Worthy Keeler PA-C Entered By: Gretta Cool BSN, RN, CWS,  Kim on 01/21/2020 12:37:42 [name] ([id number]) -------------------------------------------------------------------------------- Problem List Details Patient Name: KLAIRA, PESCI. Date of Service: 01/20/2020 3:45 PM Medical Record Number: [id number] Patient Account Number: [id number] Date of Birth/Sex: [birth year] (75 y.o. F) Treating RN: Cornell Barman Primary Care Provider: Lelon Huh Other Clinician: Referring Provider: Lelon Huh Treating Provider/Extender: Skipper Cliche in Treatment: 5 Active Problems ICD-10 Encounter Code Description Active Date MDM Diagnosis I89.0 Lymphedema, not elsewhere classified 12/11/2019 No Yes I87.2 Venous insufficiency (chronic) (peripheral) 12/11/2019 No Yes L03.115 Cellulitis of right lower limb 12/11/2019 No Yes L03.116 Cellulitis of left lower limb 12/11/2019 No Yes Inactive Problems Resolved Problems Electronic Signature(s) Signed: 01/20/2020 4:50:09 PM By: Worthy Keeler PA-C Entered By: Worthy Keeler on 01/20/2020 16:50:08 [name] ([id number]) -------------------------------------------------------------------------------- Progress Note Details Patient Name: Jillian Albe B. Date of Service: 01/20/2020 3:45 PM Medical Record Number: [id number] Patient Account Number: [id number] Date of Birth/Sex: Aug 26, 1945 (75 y.o. F) Treating RN: Cornell Barman Primary Care Provider: Lelon Huh Other Clinician: Referring Provider: Lelon Huh Treating Provider/Extender: Skipper Cliche in Treatment: 5 Subjective Chief Complaint Information obtained from Patient Patient seen for complaints of Non-Healing Wounds to both lower extremities History of Present Illness (HPI) The following HPI elements were documented for the patient's wound: Location: both lower extremity swelling with ulceration 75 year old patient who sees her PCP Dr. Lelon Huh was recently evaluated 10 days ago for diabetes mellitus, hypertension, CHF  and hyperlipidemia. she also was noted to have ulcerations develop in her legs and she has been applying Silvadene dressings locally. In the past she has refused wound care referrals.her cardiologist Dr. Saunders Revel saw her and put her on 40 mg of furosemide daily. last hemoglobin A1c was 7.7%. she was also placed on ciprofloxacin twice daily for 7 days and a urine culture was recommended. past medical history significant for coronary artery disease, diabetes mellitus, nonischemic cardiomyopathy and pulmonary hypertension. She is also status post heart catheterization and coronary angiography, tubal ligation and breast cyst removal in the past. She has never been a smoker. Patient had arterial studies done which showed bilateral ABIs are artificially elevated due to noncompressible and calcified vessels. Triphasic waveform throughout. Right great toe TBI is elevated while the left is normal. 10/23/2016 -- the patient is rather moribund from several issues including chronic back pain and knee pain and swelling of her legs. The large necrotic area on her left lateral anterior calf was bleeding on touch after washing her leg. There was a spot which needed silver nitrate cauterization and this was done appropriately. 11/06/2016 -- she is again noted to have friable bleeding from the left lower extremity wounds and this again had to be cauterized with silver nitrate to control the bleeding as pressure itself would not do it. 11/20/2016 -- the right lower extremity is completely healed and we have ordered 30-40  mm compression stocking's in both the dural layer and also a pair of juxta lites. 12/18/2016 -- she has not been wearing her compression stockings on her right leg and these have opened out into ulcerations again. Her left lower extremity has circumferential ulcerations now. I believe at this stage I would like to get her venous reflux studies done to make certain that there is no fixable superficial  venous reflux. 12/29/2016 -- she has made an excellent recovery having continued with her appropriate doses of diuretics and elevation and exercise. She has not yet received her right lower extremity juxta lites. Her venous duplex study is at the end of January 01/13/16 on evaluation today patient's wounds appeared to be overall doing much better much less hyper granular than previous week's evaluation. The Hydrofera Blue Dressing's to be doing very well. She does tell me that she is having a little bit more discomfort in the posterior aspect of her leg where she has a wound at this point. Fortunately there appears to be no infection. 01/19/17 on evaluation today patient appears to be doing very well in regard to her bilateral lower extremity swelling and at this point in time her left lower extremity ulcers. Her wounds appear to be doing much better. She has been tolerating the dressing's at this point fortunately she did get the Juxta-Lite compression as of today as well. Nonetheless I am pleased that she has been tolerating everything so well and that her wounds looks so good. In fact she is an excellent granular surface no evidence of slough covering and I do not see any reason for likely debridement today. Especially in regard to the posterior leg. 01/26/17 on evaluation today patient appears to be doing decently well in regard to her wounds. The surface of the majority of her wounds is greatly improved. She has been using the Juxta-Lite compression which is excellent. Nonetheless the wound of the left posterior calf did require some debridement today otherwise the majority of the wounds did not require any debridement. The this was due to slough buildup on the surface. She also has a small skin flap where two of the ulcers actually connected and this has loosened up I'm concerned that things may not heal well with that flap but for the time being I'm not gonna do anything different in that  regard. 02/23/17 on evaluation today patient's wound actually appears to be doing fairly well in regard to the remaining left lateral ulcer which we have been treated. With that being said it does appear that her wrap actually slid down the wood bit over the past few days at least that there's actually some friction injury and blistering noted on the medial aspect of her malleolus of the left lower extremity. She continues to use the Juxta- Lite for the right lower extremity. With that being said no debridement is necessary today and these are very superficial injuries as far as the new injuries are concerned. 03/02/17 on evaluation today patient appears to be doing excellent in regard to her left lateral lower from the ulcers. In fact it appears that she is almost completely healed in regard to the ulcer we have been following and the new injuries which were caused by the wraps slipping down last week have been completely resolved which is excellent news. Overall I'm definitely pleased with the progress that she has made. Fortunately the rat did not cause any irritation as it did previously with the Unna wrap. 03/09/17 on evaluation today  patient's wound appears to be completely healed. Obviously this is great news. She and her husband both are extremely pleased and excited to finally have this gone. Obviously she has been dealing with this wound for quite some time. Readmission: [name], [name] ([id number]) 04/14/2019 upon evaluation today patient appears for reevaluation here in our clinic although it has been since March 2019 since I last saw her. She unfortunately is having issues similar to what she had previous which are blisters and ulcerations of the bilateral lower extremities. Fortunately there is no evidence of active infection at this time based on what I am seeing. Unfortunately she is still having a lot of drainage and weeping. I do believe she needs to be compression wrap in order to get  this under control and she is previously tolerated a 3 layer compression wrap without any complications whatsoever. No fevers, chills, nausea, vomiting, or diarrhea. 04/28/2019 upon evaluation today patient appears to be doing well with regard to the bilateral lower extremities at this time. She has been tolerating the dressing changes without complication. Fortunately there is no signs of active infection at this time. No fevers, chills, nausea, vomiting, or diarrhea. 05/05/2019 upon evaluation today patient appears to be doing excellent in regard to her legs. She has a lot of new skin growth and overall seems to be progressing quite nicely. Fortunately there is no signs of active infection at this time. No fevers, chills, nausea, vomiting, or diarrhea. 05/12/2019 upon evaluation today patient appears to be doing very well with regard to her lower extremities. She is not completely healed at this point but nonetheless he is doing much better even compared to last week. I am very pleased with where things stand. No fevers, chills, nausea, vomiting, or diarrhea. 05/26/2019 upon evaluation today patient actually appears to be doing worse with regard to her lower extremities. She has green drainage unfortunately is having more pain on the right lower extremity as well. Fortunately there is no signs of active infection systemically which is good news. 06/03/2019 upon evaluation today patient appears to be doing about the same in regard to the fact that she still has wounds on both lower extremities although all wounds are measuring better and appearing much better. She finally did start taking the medication which has made a big difference for her that is the Levaquin. Unfortunately this appears to be making her urinate much more frequently. She has been taking this in the evening therefore it is keeping her up at night. They are wondering if they can switch to taking this during the day. I would actually  never instructed that they had to take this at night and I think it is definitely fine for her to take it in the morning. She will start that tomorrow. 06/10/2019 upon evaluation today patient appears to be doing excellent in regard to her lower extremities currently. The right lower extremity actually appears to be pretty much healed based on what I am seeing I do not see anything open or draining. The left lower extremity though not completely healed does appear to be doing much better which is great news. Overall there is no signs of active infection at this time. 06/17/2019 upon evaluation today patient appears to be doing excellent in regard to her ulcers. She has been tolerating the dressing changes without complication in fact everything appears to be almost completely healed today. There is one area on the left lower extremity which appears to potentially still be weeping  slightly and I think that we do need to see about wrapping her 1 more week just to make sure everything tightens up before discharge. 06/24/2019 upon evaluation today patient appears to be doing excellent in regard to her bilateral lower extremities. There does not appear to be any evidence of active infection which is great news and overall I am extremely pleased with the fact that again she appears to be ready for discharge. Readmission: 12/11/2019 patient is here for readmission today concerning her bilateral lower extremities. We have not seen her since June 2021. With that being said for the past 2 months apparently she has been having issues with her legs. It was noted by her husband that unfortunately he attempted to call to get an appointment with [place] but was not able to get anything in a sufficient amount of time to get her in sooner due to the fact that again we were having issues with staffing and scheduling. Therefore it was delayed getting him into the wound care center for his wife. Nonetheless she appears to have  evidence of cellulitis today of the bilateral lower extremities. Unfortunately she does not seem to be doing too well she is having a tremendous amount of pain and is asking for pain medication. Obviously it does look like her legs hurt although her wounds appear to be very superficial I am more concerned about the cellulitis aspect of this as opposed to the actual wound openings. 01/06/2020 upon evaluation today patient's legs actually appear to be doing significantly better as compared to last time I saw her when we were seeing her back for the first evaluation and actually had to send her to the ER for further evaluation. She was admitted to the hospital and was actually there from 12/12/2019 through 12/15/2019. With that being said the patient was placed on IV antibiotics initially, Ancef and IV fluids. Subsequently upon discharge she was given Keflex. With that being said she is getting need an extension of the Keflex as she was only given 3 days in 1 to make sure this completely resolves and does not cause her any additional issues or concerns here. I will send that in for them today. 01/13/2020 upon evaluation today patient appears to be doing decently well in regard to her legs. The areas where she had wounds previously have actually improved which is great news. There does not appear to be any signs of active infection at this time which is great news. With IV NSAIDs she did have some itching on the top of her foot that prompted them to remove the dressing/compression wrap last week because she was just absolutely miserable over the weekend. Nonetheless I do think that we can do some things to try to help out here there may be a little bit of a fungal and for. We are going to treat that accordingly. 01/20/2020 upon evaluation today patient actually appears to be doing well with regard to her legs. In general I am very pleased with how things seem to be progressing. I do not see any signs of active  infection at this time which is great news. Overall I think the patient is pleased as well she is not really having any pain and she tells me that itching is greatly improved Objective Constitutional Obese and well-hydrated in no acute distress. [name], [name] ([id number]) Vitals Time Taken: 4:30 PM, Height: 64 in, Weight: 205 lbs, BMI: 35.2, Temperature: 98.2 F, Pulse: 63 bpm, Respiratory Rate: 18 breaths/min, Blood  Pressure: 171/91 mmHg. Respiratory normal breathing without difficulty. Psychiatric this patient is able to make decisions and demonstrates good insight into disease process. Alert and Oriented x 3. pleasant and cooperative. General Notes: Upon inspection patient's wound bed actually showed signs of excellent granulation and epithelization at all wound locations. I really feel like she is making excellent progress since even last week and I am very pleased in that regard. There is no evidence of infection right now. Integumentary (Hair, Skin) Wound #21 status is Open. Original cause of wound was Gradually Appeared. The wound is located on the Left,Lateral Lower Leg. The wound measures 1cm length x 4cm width x 0.1cm depth; 3.142cm^2 area and 0.314cm^3 volume. There is Fat Layer (Subcutaneous Tissue) exposed. There is no tunneling or undermining noted. There is a medium amount of serosanguineous drainage noted. The wound margin is flat and intact. There is large (67-100%) granulation within the wound bed. There is no necrotic tissue within the wound bed. Wound #22 status is Open. Original cause of wound was Gradually Appeared. The wound is located on the Right Ankle. The wound measures 0cm length x 0cm width x 0cm depth; 0cm^2 area and 0cm^3 volume. There is no tunneling or undermining noted. There is a medium amount of serosanguineous drainage noted. There is no granulation within the wound bed. There is no necrotic tissue within the wound bed. Wound #23 status is Open. Original  cause of wound was Gradually Appeared. The wound is located on the Left,Medial Ankle. The wound measures 1cm length x 1.2cm width x 0.1cm depth; 0.942cm^2 area and 0.094cm^3 volume. There is Fat Layer (Subcutaneous Tissue) exposed. There is no tunneling or undermining noted. There is a large amount of serous drainage noted. The wound margin is flat and intact. There is large (67- 100%) pink granulation within the wound bed. There is no necrotic tissue within the wound bed. Assessment Active Problems ICD-10 Lymphedema, not elsewhere classified Venous insufficiency (chronic) (peripheral) Cellulitis of right lower limb Cellulitis of left lower limb Plan 1. Would recommend currently the regarding continue with the wound care measures as before we use the triamcinolone to both legs and then subsequently we will be utilizing just an ABD pad and/or foam to cover over areas since her not draining too much the silver cell which is getting really stopped. I do not think that is necessary to continue. 2. I would recommend as well that we continue with the compression wrap. We have been utilizing a three layer compression wrap bilaterally and I think that is doing a great job for her. 3. We will also continue to have the patient elevate her legs much as possible try to keep edema under control. She does have compression stockings for when she is done/healed. We will see patient back for reevaluation in 1 week here in the clinic. If anything worsens or changes patient will contact our office for additional recommendations. Electronic Signature(s) Signed: 01/20/2020 5:05:43 PM By: Worthy Keeler PA-C Entered By: Worthy Keeler on 01/20/2020 17:05:43 [name] ([id number]) -------------------------------------------------------------------------------- SuperBill Details Patient Name: [name] Date of Service: 01/20/2020 Medical Record Number: [id number] Patient Account Number:  [id number] Date of Birth/Sex: 08/25/1945 (75 y.o. F) Treating RN: Cornell Barman Primary Care Provider: Lelon Huh Other Clinician: Referring Provider: Lelon Huh Treating Provider/Extender: Skipper Cliche in Treatment: 5 Diagnosis Coding ICD-10 Codes Code Description I89.0 Lymphedema, not elsewhere classified I87.2 Venous insufficiency (chronic) (peripheral) L03.115 Cellulitis of right lower limb L03.116 Cellulitis of left lower  limb Facility Procedures CPT4 Code: 21224825 Description: (Facility Use Only) [phone] - [facility] LWR RT LEG Modifier: Quantity: 1 Physician Procedures CPT4 Code: 8891694 Description: 50388 - WC PHYS LEVEL 3 - EST PT Modifier: Quantity: 1 CPT4 Code: Description: ICD-10 Diagnosis Description I89.0 Lymphedema, not elsewhere classified I87.2 Venous insufficiency (chronic) (peripheral) L03.115 Cellulitis of right lower limb L03.116 Cellulitis of left lower limb Modifier: Quantity: Electronic Signature(s) Signed: 01/22/2020 8:41:15 AM By: Gretta Cool, BSN, RN, CWS, Kim RN, BSN Signed: 01/22/2020 6:06:24 PM By: Worthy Keeler PA-C Previous Signature: 01/20/2020 5:06:02 PM Version By: Worthy Keeler PA-C Entered By: Gretta Cool, BSN, RN, CWS, Kim on 01/20/2020 17:26:44

## 2020-01-22 NOTE — Progress Notes (Signed)
[name], [name] ([id number]) Visit Report for 01/20/2020 Arrival Information Details Patient Name: [name], [name]. Date of Service: 01/20/2020 3:45 PM Medical Record Number: [id number] Patient Account Number: [id number] Date of Birth/Sex: [birth year] (75 y.o. F) Treating Watson: [name] Primary Care [name]: [name] Other Clinician: Referring [name]: [name] Treating [name]/Extender: [name] in Treatment: 5 Visit Information History Since Last Visit Has Dressing in Place as Prescribed: Yes Patient Arrived: Wheel Chair Has Compression in Place as Prescribed: Yes Arrival Time: 16:46 Pain Present Now: Yes Accompanied By: spouse Transfer Assistance: Manual Patient Identification Verified: Yes Secondary Verification Process Completed: Yes Patient Requires Transmission-Based Precautions: No Patient Has Alerts: No Electronic Signature(s) Signed: 01/22/2020 8:41:15 AM By: [name] Entered By: [name], BSN, Watson, CWS, Jillian on 01/20/2020 16:46:35 [name] ([id number]) -------------------------------------------------------------------------------- Compression Therapy Details Patient Name: [name], Jillian B. Date of Service: 01/20/2020 3:45 PM Medical Record Number: [id number] Patient Account Number: [id number] Date of Birth/Sex: [birth year] (76 y.o. F) Treating Watson: [name] Primary Care Taiki Buckwalter: [name] Other Clinician: Referring [name]: [name] Treating [name]/Extender: [name] in Treatment: 5 Compression Therapy Performed for Wound Assessment: Wound #21 Left,Lateral Lower Leg Performed By: Clinician Jillian Coventry, Watson Compression Type: Three Layer Post Procedure Diagnosis Same as Pre-procedure Notes PAtient tolerating wraps well. Electronic Signature(s) Signed: 01/22/2020 8:41:15 AM By: [name] Entered By: [name], BSN, Watson, CWS, Jillian on 01/20/2020 17:05:15 [name]  ([id number]) -------------------------------------------------------------------------------- Compression Therapy Details Patient Name: [name], [name] Date of Service: 01/20/2020 3:45 PM Medical Record Number: [id number] Patient Account Number: [id number] Date of Birth/Sex: [birth year] (75 y.o. F) Treating Watson: [name] Primary Care Phuong Moffatt: [name] Other Clinician: Referring [name]: [name] Treating [name]/Extender: [name] in Treatment: 5 Compression Therapy Performed for Wound Assessment: Wound #22 Right Ankle Performed By: Clinician Jillian Coventry, Watson Compression Type: Three Layer Post Procedure Diagnosis Same as Pre-procedure Notes PAtient tolerating wraps well. Electronic Signature(s) Signed: 01/22/2020 8:41:15 AM By: [name] Entered By: [name], BSN, Watson, CWS, Jillian on 01/20/2020 17:05:16 [name] ([id number]) -------------------------------------------------------------------------------- Compression Therapy Details Patient Name: [name], [name] Date of Service: 01/20/2020 3:45 PM Medical Record Number: [id number] Patient Account Number: [id number] Date of Birth/Sex: [birth year] (75 y.o. F) Treating Watson: [name] Primary Care [name]: [name] Other Clinician: Referring [name]: [name] Treating [name]/Extender: [name] in Treatment: 5 Compression Therapy Performed for Wound Assessment: Wound #23 Left,Medial Ankle Performed By: Clinician Jillian Coventry, Watson Compression Type: Three Layer Post Procedure Diagnosis Same as Pre-procedure Notes PAtient tolerating wraps well. Electronic Signature(s) Signed: 01/22/2020 8:41:15 AM By: [name] Entered By: [name], BSN, Watson, CWS, Jillian on 01/20/2020 17:05:16 [name] ([id number]) -------------------------------------------------------------------------------- Encounter Discharge Information Details Patient Name: [name], [name]. Date  of Service: 01/20/2020 3:45 PM Medical Record Number: [id number] Patient Account Number: [id number] Date of Birth/Sex: Oct 18, 1945 (75 y.o. F) Treating Watson: [name] Primary Care [name]: [name] Other Clinician: Referring [name]: [name] Treating [name]/Extender: [name] in Treatment: 5 Encounter Discharge Information Items Discharge Condition: Stable Ambulatory Status: Ambulatory Discharge Destination: Home Transportation: Private Auto Accompanied By: self Schedule Follow-up Appointment: Yes Clinical Summary of Care: Electronic Signature(s) Signed: 01/22/2020 8:41:15 AM By: [name] Entered By: [name], BSN, Watson, CWS, Jillian on 01/20/2020 17:28:35 [name] ([id number]) -------------------------------------------------------------------------------- Lower Extremity Assessment Details Patient Name: [name], Jillian  B. Date of Service: 01/20/2020 3:45 PM Medical Record Number: [id number] Patient Account Number: [id number] Date of Birth/Sex: 1945/10/18 (75 y.o. F) Treating Watson: [name] Primary Care [name]: [name] Other Clinician: Referring [name]: [name] Treating [name]/Extender: [name] in Treatment: 5 Edema Assessment Assessed: Shirlyn Goltz: No] [name]: No] [Left: Edema] [Right: :] Calf Left: Right: Point of Measurement: 33 cm From Medial Instep 44 cm 44.5 cm Ankle Left: Right: Point of Measurement: 8 cm From Medial Instep 24.5 cm 23 cm Vascular Assessment Pulses: Dorsalis Pedis Palpable: [Left:Yes] [Right:Yes] Electronic Signature(s) Signed: 01/22/2020 8:41:15 AM By: [name], BSN, Watson, CWS, [name], Watson Entered By: [name], BSN, Watson, CWS, Jillian on 01/20/2020 16:52:19 [name] ([id number]) -------------------------------------------------------------------------------- Multi Wound Chart Details Patient Name: Jillian Albe B. Date of Service: 01/20/2020 3:45 PM Medical Record Number: [id number] Patient  Account Number: [id number] Date of Birth/Sex: [name], 1947 (75 y.o. F) Treating Watson: [name] Primary Care Paitynn Mikus: [name] Other Clinician: Referring Leshaun Biebel: [name] Treating Baylea Milburn/Extender: [name] in Treatment: 5 Vital Signs Height(in): 64 Pulse(bpm): 37 Weight(lbs): 205 Blood Pressure(mmHg): 171/91 Body Mass Index(BMI): 35 Temperature(F): 98.2 Respiratory Rate(breaths/min): 18 Photos: Wound Location: Left, Lateral Lower Leg Right Ankle Left, Medial Ankle Wounding Event: Gradually Appeared Gradually Appeared Gradually Appeared Primary Etiology: Venous Leg Ulcer Venous Leg Ulcer Diabetic Wound/Ulcer of the Lower Extremity Secondary Etiology: N/A N/A Lymphedema Comorbid History: Lymphedema, Asthma, Congestive Lymphedema, Asthma, Congestive Lymphedema, Asthma, Congestive Heart Failure, Coronary Artery Heart Failure, Coronary Artery Heart Failure, Coronary Artery Disease, Hypertension, Type II Disease, Hypertension, Type II Disease, Hypertension, Type II Diabetes, Osteoarthritis Diabetes, Osteoarthritis Diabetes, Osteoarthritis Date Acquired: 01/13/2020 01/13/2020 01/18/2020 Weeks of Treatment: 1 1 0 Wound Status: Open Open Open Clustered Wound: Yes Yes No Clustered Quantity: 2 2 N/A Measurements L x W x D (cm) 1x4x0.1 0x0x0 1x1.2x0.1 Area (cm) : 3.142 0 0.942 Volume (cm) : 0.314 0 0.094 % Reduction in Area: 83.Watson% 100.00% N/A % Reduction in Volume: 83.Watson% 100.00% N/A Classification: Full Thickness Without Exposed Full Thickness Without Exposed Grade 1 Support Structures Support Structures Exudate Amount: Medium Medium Large Exudate Type: Serosanguineous Serosanguineous Serous Exudate Color: red, brown red, brown amber Wound Margin: Flat and Intact N/A Flat and Intact Granulation Amount: Large (67-100%) None Present (0%) Large (67-100%) Granulation Quality: N/A N/A Pink Necrotic Amount: None Present (0%) None Present (0%) None Present  (0%) Exposed Structures: Fat Layer (Subcutaneous Tissue): Fascia: No Fat Layer (Subcutaneous Tissue): Yes Fat Layer (Subcutaneous Tissue): Yes Fascia: No No Fascia: No Tendon: No Tendon: No Tendon: No Muscle: No Muscle: No Muscle: No Joint: No Joint: No Joint: No Bone: No Bone: No Bone: No Epithelialization: None Large (67-100%) None Treatment Notes Electronic Signature(s) RANDE, DARIO ([id number]) Signed: 01/22/2020 8:41:15 AM By: [name], BSN, Watson, CWS, [name], Watson Entered By: [name], BSN, Watson, CWS, Jillian on 01/20/2020 17:04:36 [name] ([id number]) -------------------------------------------------------------------------------- Multi-Disciplinary Care Plan Details Patient Name: [name], LOWN. Date of Service: 01/20/2020 3:45 PM Medical Record Number: [id number] Patient Account Number: [id number] Date of Birth/Sex: [birth year] (75 y.o. F) Treating Watson: [name] Primary Care Makalia Bare: [name] Other Clinician: Referring Shahid Flori: [name] Treating Symphony Demuro/Extender: [name] in Treatment: 5 Active Inactive Pain, Acute or Chronic Nursing Diagnoses: Pain, acute or chronic: actual or potential Goals: Patient will verbalize adequate pain control and receive pain control interventions during procedures as needed Date Initiated: 12/11/2019 Target Resolution Date: 01/11/2020 Goal Status: Active Patient/caregiver will verbalize comfort level met Date Initiated: 12/11/2019 Target Resolution Date: 01/11/2020  Goal Status: Active Interventions: Assess comfort goal upon admission Complete pain assessment as per visit requirements Notes: Wound/Skin Impairment Nursing Diagnoses: Impaired tissue integrity Goals: Patient/caregiver will verbalize understanding of skin care regimen Date Initiated: 12/11/2019 Target Resolution Date: 01/11/2020 Goal Status: Active Ulcer/skin breakdown will have a volume reduction of Watson% by week 4 Date Initiated: 12/11/2019 Target  Resolution Date: 01/11/2020 Goal Status: Active Interventions: Assess patient/caregiver ability to obtain necessary supplies Assess patient/caregiver ability to perform ulcer/skin care regimen upon admission and as needed Provide education on ulcer and skin care Treatment Activities: Skin care regimen initiated : 12/11/2019 Notes: Electronic Signature(s) Signed: 01/22/2020 8:41:15 AM By: [name] Entered By: [name], BSN, Watson, CWS, Jillian on 01/20/2020 17:04:27 [name] ([id number]) -------------------------------------------------------------------------------- Pain Assessment Details Patient Name: [name], Jillian B. Date of Service: 01/20/2020 3:45 PM Medical Record Number: [id number] Patient Account Number: [id number] Date of Birth/Sex: 1945/08/01 (75 y.o. F) Treating Watson: [name] Primary Care Kj Imbert: [name] Other Clinician: Referring Iszabella Hebenstreit: [name] Treating Shawntina Diffee/Extender: [name] in Treatment: 5 Active Problems Location of Pain Severity and Description of Pain Patient Has Paino No Site Locations Pain Management and Medication Current Pain Management: Electronic Signature(s) Signed: 01/22/2020 8:41:15 AM By: [name] Entered By: [name], BSN, Watson, CWS, Jillian on 01/20/2020 16:47:23 [name] ([id number]) -------------------------------------------------------------------------------- Patient/Caregiver Education Details Patient Name: [name] Date of Service: 01/20/2020 3:45 PM Medical Record Number: [id number] Patient Account Number: [id number] Date of Birth/Gender: 04-08-1945 (75 y.o. F) Treating Watson: [name] Primary Care Physician: [name] Other Clinician: Referring Physician: Mila Watson Treating Physician/Extender: [name] in Treatment: 5 Education Assessment Education Provided To: Patient Education Topics Provided Wound/Skin Impairment: Handouts: Caring for Your  Ulcer Methods: Demonstration, Explain/Verbal Responses: State content correctly Electronic Signature(s) Signed: 01/22/2020 8:41:15 AM By: [name] Entered By: [name], BSN, Watson, CWS, Jillian on 01/20/2020 17:27:31 [name] ([id number]) -------------------------------------------------------------------------------- Wound Assessment Details Patient Name: [name], Jillian B. Date of Service: 01/20/2020 3:45 PM Medical Record Number: [id number] Patient Account Number: [id number] Date of Birth/Sex: [birth year] (75 y.o. F) Treating Watson: [name] Primary Care  Wenzlick: [name] Other Clinician: Referring Dyllon Henken: [name] Treating Daphine Loch/Extender: [name] in Treatment: 5 Wound Status Wound Number: 21 Primary Venous Leg Ulcer Etiology: Wound Location: Left, Lateral Lower Leg Wound Open Wounding Event: Gradually Appeared Status: Date Acquired: 01/13/2020 Comorbid Lymphedema, Asthma, Congestive Heart Failure, Weeks Of Treatment: 1 History: Coronary Artery Disease, Hypertension, Type II Diabetes, Clustered Wound: Yes Osteoarthritis Photos Wound Measurements Length: (cm) 1 Width: (cm) 4 Depth: (cm) 0.1 Clustered Quantity: 2 Area: (cm) 3.142 Volume: (cm) 0.314 % Reduction in Area: 83.3% % Reduction in Volume: 83.3% Epithelialization: None Tunneling: No Undermining: No Wound Description Classification: Full Thickness Without Exposed Support Structures Wound Margin: Flat and Intact Exudate Amount: Medium Exudate Type: Serosanguineous Exudate Color: red, brown Foul Odor After Cleansing: No Slough/Fibrino No Wound Bed Granulation Amount: Large (67-100%) Exposed Structure Necrotic Amount: None Present (0%) Fascia Exposed: No Fat Layer (Subcutaneous Tissue) Exposed: Yes Tendon Exposed: No Muscle Exposed: No Joint Exposed: No Bone Exposed: No Treatment Notes Wound #21 (Lower Leg) Wound Laterality: Left, Lateral Cleanser [place]  Ancillary Kit - 15 Day Supply Discharge Instruction: Use supplies as instructed; Kit contains: (15) Saline Bullets; (15) 3x3 Gauze; 15 pr Gloves [name], [name] B. ([id number]) Normal Saline Discharge Instruction: Wash your hands with soap and water. Remove old dressing, discard into plastic  bag and place into trash. Cleanse the wound with Normal Saline prior to applying a clean dressing using gauze sponges, not tissues or cotton balls. Do not scrub or use excessive force. Pat dry using gauze sponges, not tissue or cotton balls. Peri-Wound Care Triamcinolone Acetonide Cream, 0.1%, 15 (g) tube Topical Primary Dressing Gauze Discharge Instruction: As directed: dry, moistened with saline or moistened with Dakins Solution Secondary Dressing Secured With Compression Wrap Profore Lite LF 3 Multilayer Compression [place] Discharge Instruction: Apply 3 multi-layer wrap as prescribed. Compression Stockings Environmental education officer) Signed: 01/22/2020 8:41:15 AM By: [name], BSN, Watson, CWS, [name], Watson Entered By: [name], BSN, Watson, CWS, Jillian on 01/20/2020 16:48:31 [name] ([id number]) -------------------------------------------------------------------------------- Wound Assessment Details Patient Name: TRULY, STANKIEWICZ. Date of Service: 01/20/2020 3:45 PM Medical Record Number: [id number] Patient Account Number: [id number] Date of Birth/Sex: 11/26/1945 (75 y.o. F) Treating Watson: [name] Primary Care Colene Mines: [name] Other Clinician: Referring Maya Arcand: [name] Treating Amjad Fikes/Extender: [name] in Treatment: 5 Wound Status Wound Number: 22 Primary Venous Leg Ulcer Etiology: Wound Location: Right Ankle Wound Open Wounding Event: Gradually Appeared Status: Date Acquired: 01/13/2020 Comorbid Lymphedema, Asthma, Congestive Heart Failure, Weeks Of Treatment: 1 History: Coronary Artery Disease, Hypertension, Type II Diabetes, Clustered Wound:  Yes Osteoarthritis Photos Wound Measurements Length: (cm) Width: (cm) Depth: (cm) Clustered Quantity: Area: (cm) Volume: (cm) 0 % Reduction in Area: 100% 0 % Reduction in Volume: 100% 0 Epithelialization: Large (67-100%) 2 Tunneling: No 0 Undermining: No 0 Wound Description Classification: Full Thickness Without Exposed Support Structures Exudate Amount: Medium Exudate Type: Serosanguineous Exudate Color: red, brown Foul Odor After Cleansing: No Slough/Fibrino Yes Wound Bed Granulation Amount: None Present (0%) Exposed Structure Necrotic Amount: None Present (0%) Fascia Exposed: No Fat Layer (Subcutaneous Tissue) Exposed: No Tendon Exposed: No Muscle Exposed: No Joint Exposed: No Bone Exposed: No Treatment Notes Wound #22 (Ankle) Wound Laterality: Right Cleanser [place] Ancillary Kit - 15 Day Supply Discharge Instruction: Use supplies as instructed; Kit contains: (15) Saline Bullets; (15) 3x3 Gauze; 15 pr Gloves Normal Saline ABBYGAEL, CURTISS ([id number]) Discharge Instruction: Wash your hands with soap and water. Remove old dressing, discard into plastic bag and place into trash. Cleanse the wound with Normal Saline prior to applying a clean dressing using gauze sponges, not tissues or cotton balls. Do not scrub or use excessive force. Pat dry using gauze sponges, not tissue or cotton balls. Peri-Wound Care Triamcinolone Acetonide Cream, 0.1%, 15 (g) tube Topical Primary Dressing Gauze Discharge Instruction: As directed: dry, moistened with saline or moistened with Dakins Solution Secondary Dressing Secured With Compression Wrap Profore Lite LF 3 Multilayer Compression [place] Discharge Instruction: Apply 3 multi-layer wrap as prescribed. Compression Stockings Environmental education officer) Signed: 01/22/2020 8:41:15 AM By: [name], BSN, Watson, CWS, [name], Watson Entered By: [name], BSN, Watson, CWS, Jillian on 01/20/2020 16:49:Watson [name]  ([id number]) -------------------------------------------------------------------------------- Wound Assessment Details Patient Name: AURA, BIBBY. Date of Service: 01/20/2020 3:45 PM Medical Record Number: [id number] Patient Account Number: [id number] Date of Birth/Sex: 1945/02/17 (75 y.o. F) Treating Watson: [name] Primary Care Aadon Gorelik: [name] Other Clinician: Referring Aryiana Klinkner: [name] Treating Lori Popowski/Extender: [name] in Treatment: 5 Wound Status Wound Number: 23 Primary Diabetic Wound/Ulcer of the Lower Extremity Etiology: Wound Location: Left, Medial Ankle Secondary Lymphedema Wounding Event: Gradually Appeared Etiology: Date Acquired: 01/18/2020 Wound Open Weeks Of Treatment: 0 Status: Clustered Wound: No Comorbid Lymphedema, Asthma, Congestive Heart Failure, History: Coronary Artery Disease, Hypertension, Type II  Diabetes, Osteoarthritis Photos Wound Measurements Length: (cm) 1 % Red Width: (cm) 1.2 % Red Depth: (cm) 0.1 Epith Area: (cm) 0.942 Tunn Volume: (cm) 0.094 Unde uction in Area: uction in Volume: elialization: None eling: No rmining: No Wound Description Classification: Grade 1 Foul Wound Margin: Flat and Intact Sloug Exudate Amount: Large Exudate Type: Serous Exudate Color: amber Odor After Cleansing: No h/Fibrino Yes Wound Bed Granulation Amount: Large (67-100%) Exposed Structure Granulation Quality: Pink Fascia Exposed: No Necrotic Amount: None Present (0%) Fat Layer (Subcutaneous Tissue) Exposed: Yes Tendon Exposed: No Muscle Exposed: No Joint Exposed: No Bone Exposed: No Treatment Notes Wound #23 (Ankle) Wound Laterality: Left, Medial Cleanser [place] Ancillary Kit - [address] EATHEL, PAJAK ([id number]) Discharge Instruction: Use supplies as instructed; Kit contains: (15) Saline Bullets; (15) 3x3 Gauze; 15 pr Gloves Normal Saline Discharge Instruction: Wash your hands with soap and water. Remove old  dressing, discard into plastic bag and place into trash. Cleanse the wound with Normal Saline prior to applying a clean dressing using gauze sponges, not tissues or cotton balls. Do not scrub or use excessive force. Pat dry using gauze sponges, not tissue or cotton balls. Peri-Wound Care Triamcinolone Acetonide Cream, 0.1%, 15 (g) tube Topical Primary Dressing Gauze Discharge Instruction: As directed: dry, moistened with saline or moistened with Dakins Solution Secondary Dressing Secured With Compression Wrap Profore Lite LF 3 Multilayer Compression [place] Discharge Instruction: Apply 3 multi-layer wrap as prescribed. Compression Stockings Environmental education officer) Signed: 01/22/2020 8:41:15 AM By: [name], BSN, Watson, CWS, [name], Watson Entered By: [name], BSN, Watson, CWS, Jillian on 01/20/2020 16:55:34 [name] ([id number]) -------------------------------------------------------------------------------- [place] Details Patient Name: [name] Date of Service: 01/20/2020 3:45 PM Medical Record Number: [id number] Patient Account Number: [id number] Date of Birth/Sex: 02-19-1945 (Watson y.o. F) Treating Watson: [name] Primary Care [place] Sloop: [name] Other Clinician: Referring Camdan Burdi: [name] Treating Kennede Lusk/Extender: [name] in Treatment: 5 Vital Signs Time Taken: 16:Watson Temperature (F): 98.2 Height (in): 64 Pulse (bpm): 63 Weight (lbs): 205 Respiratory Rate (breaths/min): 18 Body Mass Index (BMI): 35.2 Blood Pressure (mmHg): 171/91 Reference Range: 80 - 120 mg / dl Electronic Signature(s) Signed: 01/22/2020 8:41:15 AM By: [name], BSN, Watson, CWS, [name], Watson Entered By: [name], BSN, Watson, CWS, Jillian on 01/20/2020 16:47:18

## 2020-01-26 ENCOUNTER — Telehealth: Payer: Self-pay

## 2020-01-26 NOTE — Telephone Encounter (Signed)
Follow up from telephone encounter from 12/16/19. Pt is still needing an Rx for a wheel chair. Some reason it is no allowing me to order the wheelchair.   Information needs to be sent to:  [employer] [phone], fax [phone], [address], [place] [address], [place] does accept Medicare and Medicaid pay assignments   [employer]/Brent Loyce Dys sales person direct number [phone], fax number [phone] , [place], [place] [address], He needs a physician or nurse practitioner to send him a prescription, demographic info and copy of the patient's insurance card. Medicare enrolled and may accept medicare assignment.   Please advise, thank you!

## 2020-01-27 ENCOUNTER — Encounter: Payer: Medicare Other | Admitting: Physician Assistant

## 2020-01-27 ENCOUNTER — Other Ambulatory Visit: Payer: Self-pay

## 2020-01-27 DIAGNOSIS — L97322 Non-pressure chronic ulcer of left ankle with fat layer exposed: Secondary | ICD-10-CM | POA: Diagnosis not present

## 2020-01-27 DIAGNOSIS — E119 Type 2 diabetes mellitus without complications: Secondary | ICD-10-CM | POA: Diagnosis not present

## 2020-01-27 DIAGNOSIS — I890 Lymphedema, not elsewhere classified: Secondary | ICD-10-CM | POA: Diagnosis not present

## 2020-01-27 DIAGNOSIS — L03116 Cellulitis of left lower limb: Secondary | ICD-10-CM | POA: Diagnosis not present

## 2020-01-27 DIAGNOSIS — L03115 Cellulitis of right lower limb: Secondary | ICD-10-CM | POA: Diagnosis not present

## 2020-01-27 DIAGNOSIS — L97822 Non-pressure chronic ulcer of other part of left lower leg with fat layer exposed: Secondary | ICD-10-CM | POA: Diagnosis not present

## 2020-01-27 DIAGNOSIS — I2510 Atherosclerotic heart disease of native coronary artery without angina pectoris: Secondary | ICD-10-CM | POA: Diagnosis not present

## 2020-01-27 DIAGNOSIS — I428 Other cardiomyopathies: Secondary | ICD-10-CM | POA: Diagnosis not present

## 2020-01-27 DIAGNOSIS — I509 Heart failure, unspecified: Secondary | ICD-10-CM | POA: Diagnosis not present

## 2020-01-27 DIAGNOSIS — I110 Hypertensive heart disease with heart failure: Secondary | ICD-10-CM | POA: Diagnosis not present

## 2020-01-27 DIAGNOSIS — I872 Venous insufficiency (chronic) (peripheral): Secondary | ICD-10-CM | POA: Diagnosis not present

## 2020-01-27 DIAGNOSIS — I272 Pulmonary hypertension, unspecified: Secondary | ICD-10-CM | POA: Diagnosis not present

## 2020-01-27 NOTE — Progress Notes (Addendum)
[name], [name] ([id number]) Visit Report for 01/27/2020 Arrival Information Details Patient Name: [name], TURVEY. Date of Service: 01/27/2020 1:30 PM Medical Record Number: [id number] Patient Account Number: [id number] Date of Birth/Sex: Feb 14, 1945 (75 y.o. F) Treating RN: Dolan Amen Primary Care Olsen Mccutchan: Lelon Huh Other Clinician: Referring Michaella Imai: Lelon Huh Treating Deasha Clendenin/Extender: Skipper Cliche in Treatment: 6 Visit Information History Since Last Visit Has Dressing in Place as Prescribed: Yes Patient Arrived: Wheel Chair Has Compression in Place as Prescribed: Yes Arrival Time: 13:29 Pain Present Now: No Accompanied By: spouse Transfer Assistance: None Patient Identification Verified: Yes Secondary Verification Process Completed: Yes Patient Requires Transmission-Based Precautions: No Patient Has Alerts: No Electronic Signature(s) Signed: 01/27/2020 2:50:05 PM By: Georges Mouse, Minus Breeding RN Entered By: Georges Mouse, Minus Breeding on 01/27/2020 13:31:00 [name] ([id number]) -------------------------------------------------------------------------------- Compression Therapy Details Patient Name: Jillian Albe B. Date of Service: 01/27/2020 1:30 PM Medical Record Number: [id number] Patient Account Number: [id number] Date of Birth/Sex: 14-Jun-1945 (75 y.o. F) Treating RN: Dolan Amen Primary Care Ilija Maxim: Lelon Huh Other Clinician: Referring Ibrahem Volkman: Lelon Huh Treating Reyce Lubeck/Extender: Skipper Cliche in Treatment: 6 Compression Therapy Performed for Wound Assessment: Wound #24 Left,Proximal,Lateral Lower Leg Performed By: Clinician Dolan Amen, RN Compression Type: Three Layer Post Procedure Diagnosis Same as Pre-procedure Notes pt tolerating wrap well Electronic Signature(s) Signed: 01/27/2020 2:50:05 PM By: Georges Mouse, Minus Breeding RN Entered By: Georges Mouse, Minus Breeding on 01/27/2020 14:08:37 [name]  ([id number]) -------------------------------------------------------------------------------- Compression Therapy Details Patient Name: [name], Jillian B. Date of Service: 01/27/2020 1:30 PM Medical Record Number: [id number] Patient Account Number: [id number] Date of Birth/Sex: 08/26/1945 (75 y.o. F) Treating RN: Dolan Amen Primary Care Khylen Riolo: Lelon Huh Other Clinician: Referring Ronrico Dupin: Lelon Huh Treating Kenston Longton/Extender: Skipper Cliche in Treatment: 6 Compression Therapy Performed for Wound Assessment: Wound #25 Left,Lateral Ankle Performed By: Clinician Dolan Amen, RN Compression Type: Three Layer Post Procedure Diagnosis Same as Pre-procedure Notes pt tolerating wrap well Electronic Signature(s) Signed: 01/27/2020 2:50:05 PM By: Georges Mouse, Minus Breeding RN Entered By: Georges Mouse, Minus Breeding on 01/27/2020 14:08:37 [name] ([id number]) -------------------------------------------------------------------------------- Compression Therapy Details Patient Name: [name], Jillian B. Date of Service: 01/27/2020 1:30 PM Medical Record Number: [id number] Patient Account Number: [id number] Date of Birth/Sex: [birth year] (75 y.o. F) Treating RN: Dolan Amen Primary Care Cayleigh Paull: Lelon Huh Other Clinician: Referring Azriel Dancy: Lelon Huh Treating Dinorah Masullo/Extender: Skipper Cliche in Treatment: 6 Compression Therapy Performed for Wound Assessment: NonWound Condition Lymphedema - Right Leg Performed By: Clinician Dolan Amen, RN Compression Type: Three Layer Post Procedure Diagnosis Same as Pre-procedure Notes pt tolerating wrap well Electronic Signature(s) Signed: 01/27/2020 2:50:05 PM By: Georges Mouse, Minus Breeding RN Entered By: Georges Mouse, Minus Breeding on 01/27/2020 14:08:59 [name] ([id number]) -------------------------------------------------------------------------------- Encounter Discharge Information Details Patient Name: [name], [name]  B. Date of Service: 01/27/2020 1:30 PM Medical Record Number: [id number] Patient Account Number: [id number] Date of Birth/Sex: 1945-08-26 (75 y.o. F) Treating RN: Dolan Amen Primary Care Rashell Shambaugh: Lelon Huh Other Clinician: Referring Evola Hollis: Lelon Huh Treating Nikole Swartzentruber/Extender: Skipper Cliche in Treatment: 6 Encounter Discharge Information Items Discharge Condition: Stable Ambulatory Status: Wheelchair Discharge Destination: Home Transportation: Private Auto Accompanied By: self Schedule Follow-up Appointment: Yes Clinical Summary of Care: Electronic Signature(s) Signed: 01/27/2020 2:50:05 PM By: Georges Mouse, Minus Breeding RN Entered By: Georges Mouse, Minus Breeding on 01/27/2020 14:38:39 [name] ([id number]) -------------------------------------------------------------------------------- Lower Extremity Assessment Details Patient Name: [name], Jillian B. Date of Service: 01/27/2020 1:30 PM Medical Record Number: [id number] Patient Account Number: [id number] Date of Birth/Sex: Feb 10, 1945 (75 y.o. F) Treating  RN: Dolan Amen Primary Care Oluwadamilola Deliz: Lelon Huh Other Clinician: Referring Nasiah Lehenbauer: Lelon Huh Treating Carrell Palmatier/Extender: Skipper Cliche in Treatment: 6 Edema Assessment Assessed: Shirlyn Goltz: Yes] Patrice Paradise: Yes] Edema: [Left: Yes] [Right: Yes] Calf Left: Right: Point of Measurement: 33 cm From Medial Instep 46 cm 44 cm Ankle Left: Right: Point of Measurement: 8 cm From Medial Instep 24.5 cm 23.5 cm Vascular Assessment Pulses: Dorsalis Pedis Palpable: [Left:Yes] [Right:Yes] Electronic Signature(s) Signed: 01/27/2020 2:50:05 PM By: Georges Mouse, Minus Breeding RN Entered By: Georges Mouse, Minus Breeding on 01/27/2020 14:00:18 [name] ([id number]) -------------------------------------------------------------------------------- Multi Wound Chart Details Patient Name: Jillian Albe B. Date of Service: 01/27/2020 1:30 PM Medical Record Number:  [id number] Patient Account Number: [id number] Date of Birth/Sex: 1945/07/01 (75 y.o. F) Treating RN: Dolan Amen Primary Care Leita Lindbloom: Lelon Huh Other Clinician: Referring Danniel Grenz: Lelon Huh Treating Parthena Fergeson/Extender: Skipper Cliche in Treatment: 6 Vital Signs Height(in): 64 Pulse(bpm): 7 Weight(lbs): 205 Blood Pressure(mmHg): 176/97 Body Mass Index(BMI): 35 Temperature(F): 98.2 Respiratory Rate(breaths/min): 18 Photos: [21:No Photos] [22:No Photos] [23:No Photos] Wound Location: [21:Left, Lateral Lower Leg] [22:Right Ankle] [23:Left, Medial Ankle] Wounding Event: [21:Gradually Appeared] [22:Gradually Appeared] [23:Gradually Appeared] Primary Etiology: [21:Venous Leg Ulcer] [22:Venous Leg Ulcer] [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] Secondary Etiology: [21:N/A] [22:N/A] [23:Lymphedema] Comorbid History: [21:N/A] [22:N/A] [23:N/A] Date Acquired: [21:01/13/2020] [22:01/13/2020] [23:01/18/2020] Weeks of Treatment: [21:2] [22:2] [23:1] Wound Status: [21:Healed - Epithelialized] [22:Healed - Epithelialized] [23:Healed - Epithelialized] Clustered Wound: [21:Yes] [22:Yes] [23:No] Measurements L x W x D (cm) [21:0x0x0] [22:0x0x0] [23:0x0x0] Area (cm) : [21:0] [22:0] [23:0] Volume (cm) : [21:0] [22:0] [23:0] % Reduction in Area: [21:100.00%] [22:100.00%] [23:100.00%] % Reduction in Volume: [21:100.00%] [22:100.00%] [23:100.00%] Classification: [21:Full Thickness Without Exposed Support Structures] [22:Full Thickness Without Exposed Support Structures] [23:Grade 1] Exudate Amount: [21:N/A] [22:N/A] [23:N/A] Exudate Type: [21:N/A] [22:N/A] [23:N/A] Exudate Color: [21:N/A] [22:N/A] [23:N/A] Wound Margin: [21:N/A] [22:N/A] [23:N/A] Granulation Amount: [21:N/A] [22:N/A] [23:N/A] Granulation Quality: [21:N/A] [22:N/A] [23:N/A] Necrotic Amount: [21:N/A N/A] [22:N/A N/A] [23:N/A N/A] Wound Number: 24 25 N/A Photos: N/A Wound Location: Left, Proximal, Lateral  Lower Leg Left, Lateral Ankle N/A Wounding Event: Gradually Appeared Gradually Appeared N/A Primary Etiology: Lymphedema Lymphedema N/A Secondary Etiology: N/A N/A N/A Comorbid History: Lymphedema, Asthma, Congestive Lymphedema, Asthma, Congestive N/A Heart Failure, Coronary Artery Heart Failure, Coronary Artery Disease, Hypertension, Type II Disease, Hypertension, Type II Diabetes, Osteoarthritis Diabetes, Osteoarthritis Date Acquired: 01/27/2020 01/27/2020 N/A Weeks of Treatment: 0 0 N/A Wound Status: Open Open N/A Clustered Wound: No No N/A Measurements L x W x D (cm) 2.2x0.4x0.1 1.5x1.5x0.1 N/A Area (cm) : 0.691 1.767 N/A [name], Jillian B. ([id number]) Volume (cm) : 0.069 0.177 N/A % Reduction in Area: 0.00% 0.00% N/A % Reduction in Volume: 0.00% 0.00% N/A Classification: Full Thickness Without Exposed Full Thickness Without Exposed N/A Support Structures Support Structures Exudate Amount: Medium Medium N/A Exudate Type: Sanguinous Serous N/A Exudate Color: red amber N/A Wound Margin: N/A Flat and Intact N/A Granulation Amount: Large (67-100%) Large (67-100%) N/A Granulation Quality: Red Pink N/A Necrotic Amount: None Present (0%) None Present (0%) N/A Exposed Structures: Fat Layer (Subcutaneous Tissue): Fat Layer (Subcutaneous Tissue): N/A Yes Yes Fascia: No Fascia: No Tendon: No Tendon: No Muscle: No Muscle: No Joint: No Joint: No Bone: No Bone: No Epithelialization: None None N/A Treatment Notes Electronic Signature(s) Signed: 01/27/2020 2:50:05 PM By: Georges Mouse, Minus Breeding RN Entered By: Georges Mouse, Minus Breeding on 01/27/2020 14:05:25 [name] ([id number]) -------------------------------------------------------------------------------- [place] Details Patient Name: [name], Jillian B. Date of Service: 01/27/2020 1:30 PM Medical Record Number:  [id number] Patient Account Number: [id number] Date of Birth/Sex: 1945-12-19 (75 y.o. F) Treating RN:  Dolan Amen Primary Care Redonna Wilbert: Lelon Huh Other Clinician: Referring Jaena Brocato: Lelon Huh Treating Kutler Vanvranken/Extender: Skipper Cliche in Treatment: 6 Active Inactive Pain, Acute or Chronic Nursing Diagnoses: Pain, acute or chronic: actual or potential Goals: Patient will verbalize adequate pain control and receive pain control interventions during procedures as needed Date Initiated: 12/11/2019 Target Resolution Date: 01/11/2020 Goal Status: Active Patient/caregiver will verbalize comfort level met Date Initiated: 12/11/2019 Target Resolution Date: 01/11/2020 Goal Status: Active Interventions: Assess comfort goal upon admission Complete pain assessment as per visit requirements Notes: Wound/Skin Impairment Nursing Diagnoses: Impaired tissue integrity Goals: Patient/caregiver will verbalize understanding of skin care regimen Date Initiated: 12/11/2019 Target Resolution Date: 01/11/2020 Goal Status: Active Ulcer/skin breakdown will have a volume reduction of 30% by week 4 Date Initiated: 12/11/2019 Target Resolution Date: 01/11/2020 Goal Status: Active Interventions: Assess patient/caregiver ability to obtain necessary supplies Assess patient/caregiver ability to perform ulcer/skin care regimen upon admission and as needed Provide education on ulcer and skin care Treatment Activities: Skin care regimen initiated : 12/11/2019 Notes: Electronic Signature(s) Signed: 01/27/2020 2:50:05 PM By: Georges Mouse, Minus Breeding RN Entered By: Georges Mouse, Minus Breeding on 01/27/2020 14:05:15 [name] ([id number]) -------------------------------------------------------------------------------- Pain Assessment Details Patient Name: Jillian Albe B. Date of Service: 01/27/2020 1:30 PM Medical Record Number: [id number] Patient Account Number: [id number] Date of Birth/Sex: Sep 20, 1945 (75 y.o. F) Treating RN: Dolan Amen Primary Care Jorryn Hershberger: Lelon Huh Other Clinician: Referring  Melquan Ernsberger: Lelon Huh Treating Korena Nass/Extender: Skipper Cliche in Treatment: 6 Active Problems Location of Pain Severity and Description of Pain Patient Has Paino No Site Locations Rate the pain. Current Pain Level: 0 Pain Management and Medication Current Pain Management: Electronic Signature(s) Signed: 01/27/2020 2:50:05 PM By: Georges Mouse, Minus Breeding RN Entered By: Georges Mouse, Minus Breeding on 01/27/2020 13:35:11 [name] ([id number]) -------------------------------------------------------------------------------- Patient/Caregiver Education Details Patient Name: [name] Date of Service: 01/27/2020 1:30 PM Medical Record Number: [id number] Patient Account Number: [id number] Date of Birth/Gender: [birth year] (75 y.o. F) Treating RN: Dolan Amen Primary Care Physician: Lelon Huh Other Clinician: Referring Physician: Lelon Huh Treating Physician/Extender: Skipper Cliche in Treatment: 6 Education Assessment Education Provided To: Patient Education Topics Provided Wound/Skin Impairment: Methods: Explain/Verbal Responses: State content correctly Electronic Signature(s) Signed: 01/27/2020 2:50:05 PM By: Georges Mouse, Minus Breeding RN Entered By: Georges Mouse, Minus Breeding on 01/27/2020 14:37:59 [name] ([id number]) -------------------------------------------------------------------------------- Wound Assessment Details Patient Name: [name], Jillian B. Date of Service: 01/27/2020 1:30 PM Medical Record Number: [id number] Patient Account Number: [id number] Date of Birth/Sex: 01-31-1945 (75 y.o. F) Treating RN: Dolan Amen Primary Care Cong Hightower: Lelon Huh Other Clinician: Referring Johna Kearl: Lelon Huh Treating Jamus Loving/Extender: Skipper Cliche in Treatment: 6 Wound Status Wound Number: 21 Primary Etiology: Venous Leg Ulcer Wound Location: Left, Lateral Lower Leg Wound Status: Healed - Epithelialized Wounding Event: Gradually  Appeared Date Acquired: 01/13/2020 Weeks Of Treatment: 2 Clustered Wound: Yes Wound Measurements Length: (cm) Width: (cm) Depth: (cm) Area: (cm) Volume: (cm) 0 % Reduction in Area: 100% 0 % Reduction in Volume: 100% 0 0 0 Wound Description Classification: Full Thickness Without Exposed Support Structu res Treatment Notes Wound #21 (Lower Leg) Wound Laterality: Left, Lateral Cleanser Peri-Wound Care Topical Primary Dressing Secondary Dressing Secured With Compression Wrap Compression Stockings Add-Ons Electronic Signature(s) Signed: 01/27/2020 2:50:05 PM By: Georges Mouse, Minus Breeding RN Entered By: Georges Mouse, Minus Breeding on 01/27/2020 13:51:27 [name] ([id number]) -------------------------------------------------------------------------------- Wound Assessment Details Patient Name: Jillian Albe B. Date of  Service: 01/27/2020 1:30 PM Medical Record Number: [id number] Patient Account Number: [id number] Date of Birth/Sex: Jun 08, 1945 (75 y.o. F) Treating RN: Dolan Amen Primary Care Brentton Wardlow: Lelon Huh Other Clinician: Referring Quynh Basso: Lelon Huh Treating Kaleeya Hancock/Extender: Skipper Cliche in Treatment: 6 Wound Status Wound Number: 22 Primary Etiology: Venous Leg Ulcer Wound Location: Right Ankle Wound Status: Healed - Epithelialized Wounding Event: Gradually Appeared Date Acquired: 01/13/2020 Weeks Of Treatment: 2 Clustered Wound: Yes Wound Measurements Length: (cm) Width: (cm) Depth: (cm) Area: (cm) Volume: (cm) 0 % Reduction in Area: 100% 0 % Reduction in Volume: 100% 0 0 0 Wound Description Classification: Full Thickness Without Exposed Support Structu res Treatment Notes Wound #22 (Ankle) Wound Laterality: Right Cleanser Peri-Wound Care Topical Primary Dressing Secondary Dressing Secured With Compression Wrap Compression Stockings Add-Ons Electronic Signature(s) Signed: 01/27/2020 2:50:05 PM By: Georges Mouse, Minus Breeding  RN Entered By: Georges Mouse, Minus Breeding on 01/27/2020 13:47:04 [name] ([id number]) -------------------------------------------------------------------------------- Wound Assessment Details Patient Name: Jillian Albe B. Date of Service: 01/27/2020 1:30 PM Medical Record Number: [id number] Patient Account Number: [id number] Date of Birth/Sex: [birth year] (75 y.o. F) Treating RN: Dolan Amen Primary Care Tiney Zipper: Lelon Huh Other Clinician: Referring Freedom Lopezperez: Lelon Huh Treating Danny Zimny/Extender: Skipper Cliche in Treatment: 6 Wound Status Wound Number: 23 Primary Etiology: Diabetic Wound/Ulcer of the Lower Extremity Wound Location: Left, Medial Ankle Secondary Etiology: Lymphedema Wounding Event: Gradually Appeared Wound Status: Healed - Epithelialized Date Acquired: 01/18/2020 Weeks Of Treatment: 1 Clustered Wound: No Wound Measurements Length: (cm) 0 Width: (cm) 0 Depth: (cm) 0 Area: (cm) 0 Volume: (cm) 0 % Reduction in Area: 100% % Reduction in Volume: 100% Wound Description Classification: Grade 1 Treatment Notes Wound #23 (Ankle) Wound Laterality: Left, Medial Cleanser Peri-Wound Care Topical Primary Dressing Secondary Dressing Secured With Compression Wrap Compression Stockings Add-Ons Electronic Signature(s) Signed: 01/27/2020 2:50:05 PM By: Georges Mouse, Minus Breeding RN Entered By: Georges Mouse, Minus Breeding on 01/27/2020 13:47:04 [name] ([id number]) -------------------------------------------------------------------------------- Wound Assessment Details Patient Name: Jillian Albe B. Date of Service: 01/27/2020 1:30 PM Medical Record Number: [id number] Patient Account Number: [id number] Date of Birth/Sex: 1945-12-05 (75 y.o. F) Treating RN: Dolan Amen Primary Care Hadiyah Maricle: Lelon Huh Other Clinician: Referring Eliu Batch: Lelon Huh Treating Netta Fodge/Extender: Skipper Cliche in Treatment: 6 Wound Status Wound Number: 24  Primary Lymphedema Etiology: Wound Location: Left, Proximal, Lateral Lower Leg Wound Open Wounding Event: Gradually Appeared Status: Date Acquired: 01/27/2020 Comorbid Lymphedema, Asthma, Congestive Heart Failure, Weeks Of Treatment: 0 History: Coronary Artery Disease, Hypertension, Type II Diabetes, Clustered Wound: No Osteoarthritis Photos Wound Measurements Length: (cm) 2.2 Width: (cm) 0.4 Depth: (cm) 0.1 Area: (cm) 0.691 Volume: (cm) 0.069 % Reduction in Area: 0% % Reduction in Volume: 0% Epithelialization: None Tunneling: No Undermining: No Wound Description Classification: Full Thickness Without Exposed Support Structures Exudate Amount: Medium Exudate Type: Sanguinous Exudate Color: red Foul Odor After Cleansing: No Slough/Fibrino No Wound Bed Granulation Amount: Large (67-100%) Exposed Structure Granulation Quality: Red Fascia Exposed: No Necrotic Amount: None Present (0%) Fat Layer (Subcutaneous Tissue) Exposed: Yes Tendon Exposed: No Muscle Exposed: No Joint Exposed: No Bone Exposed: No Treatment Notes Wound #24 (Lower Leg) Wound Laterality: Left, Lateral, Proximal Cleanser Soap and Water Discharge Instruction: Gently cleanse wound with antibacterial soap, rinse and pat dry prior to dressing wounds Peri-Wound Care BRANDAN, GLAUBER ([id number]) Triamcinolone Acetonide Cream, 0.1%, 15 (g) tube Topical Primary Dressing Secondary Dressing ABD Pad 5x9 (in/in) Discharge Instruction: Cover with ABD pad Secured With Compression Wrap Profore Lite LF 3 Multilayer Compression Bandaging System Discharge  Instruction: Apply 3 multi-layer wrap as prescribed. Unna w/Calamine, 4x10 (in/yd) Discharge Instruction: Apply unna paste -3 finger-widths below knee to anchor compression wrap in place. Compression Stockings Add-Ons Electronic Signature(s) Signed: 01/27/2020 2:50:05 PM By: Georges Mouse, Minus Breeding RN Entered By: Georges Mouse, Minus Breeding on 01/27/2020  13:57:26 [name], Jeanice Lim ([id number]) -------------------------------------------------------------------------------- Wound Assessment Details Patient Name: [name], Jillian B. Date of Service: 01/27/2020 1:30 PM Medical Record Number: [id number] Patient Account Number: [id number] Date of Birth/Sex: 1945-03-29 (75 y.o. F) Treating RN: Dolan Amen Primary Care Saliyah Gillin: Lelon Huh Other Clinician: Referring Kory Rains: Lelon Huh Treating Benay Pomeroy/Extender: Skipper Cliche in Treatment: 6 Wound Status Wound Number: 25 Primary Lymphedema Etiology: Wound Location: Left, Lateral Ankle Wound Open Wounding Event: Gradually Appeared Status: Date Acquired: 01/27/2020 Comorbid Lymphedema, Asthma, Congestive Heart Failure, Weeks Of Treatment: 0 History: Coronary Artery Disease, Hypertension, Type II Diabetes, Clustered Wound: No Osteoarthritis Photos Wound Measurements Length: (cm) 1.5 Width: (cm) 1.5 Depth: (cm) 0.1 Area: (cm) 1.767 Volume: (cm) 0.177 % Reduction in Area: 0% % Reduction in Volume: 0% Epithelialization: None Tunneling: No Undermining: No Wound Description Classification: Full Thickness Without Exposed Support Structures Wound Margin: Flat and Intact Exudate Amount: Medium Exudate Type: Serous Exudate Color: amber Foul Odor After Cleansing: No Slough/Fibrino No Wound Bed Granulation Amount: Large (67-100%) Exposed Structure Granulation Quality: Pink Fascia Exposed: No Necrotic Amount: None Present (0%) Fat Layer (Subcutaneous Tissue) Exposed: Yes Tendon Exposed: No Muscle Exposed: No Joint Exposed: No Bone Exposed: No Treatment Notes Wound #25 (Ankle) Wound Laterality: Left, Lateral Cleanser Soap and Water Discharge Instruction: Gently cleanse wound with antibacterial soap, rinse and pat dry prior to dressing wounds KIYOKO, MCGUIRT. ([id number]) Peri-Wound Care Triamcinolone Acetonide Cream, 0.1%, 15 (g) tube Topical Primary  Dressing Secondary Dressing ABD Pad 5x9 (in/in) Discharge Instruction: Cover with ABD pad Secured With Compression Wrap Profore Lite LF 3 Multilayer Compression Bandaging System Discharge Instruction: Apply 3 multi-layer wrap as prescribed. Unna w/Calamine, 4x10 (in/yd) Discharge Instruction: Apply unna paste -3 finger-widths below knee to anchor compression wrap in place. Compression Stockings Add-Ons Electronic Signature(s) Signed: 01/27/2020 2:50:05 PM By: Georges Mouse, Minus Breeding RN Entered By: Georges Mouse, Minus Breeding on 01/27/2020 13:58:16 [name] ([id number]) -------------------------------------------------------------------------------- [place] Details Patient Name: [name] Date of Service: 01/27/2020 1:30 PM Medical Record Number: [id number] Patient Account Number: [id number] Date of Birth/Sex: 06-01-1945 (75 y.o. F) Treating RN: Dolan Amen Primary Care Terren Jandreau: Lelon Huh Other Clinician: Referring Cosby Proby: Lelon Huh Treating Adeyemi Hamad/Extender: Skipper Cliche in Treatment: 6 Vital Signs Time Taken: 13:31 Temperature (F): 98.2 Height (in): 64 Pulse (bpm): 65 Weight (lbs): 205 Respiratory Rate (breaths/min): 18 Body Mass Index (BMI): 35.2 Blood Pressure (mmHg): 176/97 Reference Range: 80 - 120 mg / dl Electronic Signature(s) Signed: 01/27/2020 2:50:05 PM By: Georges Mouse, Minus Breeding RN Entered By: Georges Mouse, Minus Breeding on 01/27/2020 13:34:52

## 2020-01-27 NOTE — Progress Notes (Addendum)
[name], [name] ([id number]) Visit Report for 01/27/2020 Chief Complaint Document Details Patient Name: [name], [name]. Date of Service: 01/27/2020 1:30 PM Medical Record Number: [id number] Patient Account Number: [id number] Date of Birth/Sex: 1945-10-17 (75 y.o. F) Treating RN: Cornell Barman Primary Care Provider: Lelon Huh Other Clinician: Referring Provider: Lelon Huh Treating Provider/Extender: Skipper Cliche in Treatment: 6 Information Obtained from: Patient Chief Complaint Patient seen for complaints of Non-Healing Wounds to both lower extremities Electronic Signature(s) Signed: 01/27/2020 1:11:28 PM By: Worthy Keeler PA-C Entered By: Worthy Keeler on 01/27/2020 13:11:28 [name] ([id number]) -------------------------------------------------------------------------------- HPI Details Patient Name: [name] Date of Service: 01/27/2020 1:30 PM Medical Record Number: [id number] Patient Account Number: [id number] Date of Birth/Sex: [birth year] (75 y.o. F) Treating RN: Cornell Barman Primary Care Provider: Lelon Huh Other Clinician: Referring Provider: Lelon Huh Treating Provider/Extender: Skipper Cliche in Treatment: 6 History of Present Illness Location: both lower extremity swelling with ulceration HPI Description: 75 year old patient who sees her PCP Dr. Lelon Huh was recently evaluated 10 days ago for diabetes mellitus, hypertension, CHF and hyperlipidemia. she also was noted to have ulcerations develop in her legs and she has been applying Silvadene dressings locally. In the past she has refused wound care referrals.her cardiologist Dr. Saunders Revel saw her and put her on 40 mg of furosemide daily. last hemoglobin A1c was 7.7%. she was also placed on ciprofloxacin twice daily for 7 days and a urine culture was recommended. past medical history significant for coronary artery disease, diabetes mellitus, nonischemic cardiomyopathy and pulmonary  hypertension. She is also status post heart catheterization and coronary angiography, tubal ligation and breast cyst removal in the past. She has never been a smoker. Patient had arterial studies done which showed bilateral ABIs are artificially elevated due to noncompressible and calcified vessels. Triphasic waveform throughout. Right great toe TBI is elevated while the left is normal. 10/23/2016 -- the patient is rather moribund from several issues including chronic back pain and knee pain and swelling of her legs. The large necrotic area on her left lateral anterior calf was bleeding on touch after washing her leg. There was a spot which needed silver nitrate cauterization and this was done appropriately. 11/06/2016 -- she is again noted to have friable bleeding from the left lower extremity wounds and this again had to be cauterized with silver nitrate to control the bleeding as pressure itself would not do it. 11/20/2016 -- the right lower extremity is completely healed and we have ordered 30-40 mm compression stocking's in both the dural layer and also a pair of juxta lites. 12/18/2016 -- she has not been wearing her compression stockings on her right leg and these have opened out into ulcerations again. Her left lower extremity has circumferential ulcerations now. I believe at this stage I would like to get her venous reflux studies done to make certain that there is no fixable superficial venous reflux. 12/29/2016 -- she has made an excellent recovery having continued with her appropriate doses of diuretics and elevation and exercise. She has not yet received her right lower extremity juxta lites. Her venous duplex study is at the end of January 01/13/16 on evaluation today patient's wounds appeared to be overall doing much better much less hyper granular than previous week's evaluation. The Hydrofera Blue Dressing's to be doing very well. She does tell me that she is having a little bit more  discomfort in the posterior aspect of her leg where she has a wound at this  point. Fortunately there appears to be no infection. 01/19/17 on evaluation today patient appears to be doing very well in regard to her bilateral lower extremity swelling and at this point in time her left lower extremity ulcers. Her wounds appear to be doing much better. She has been tolerating the dressing's at this point fortunately she did get the Juxta-Lite compression as of today as well. Nonetheless I am pleased that she has been tolerating everything so well and that her wounds looks so good. In fact she is an excellent granular surface no evidence of slough covering and I do not see any reason for likely debridement today. Especially in regard to the posterior leg. 01/26/17 on evaluation today patient appears to be doing decently well in regard to her wounds. The surface of the majority of her wounds is greatly improved. She has been using the Juxta-Lite compression which is excellent. Nonetheless the wound of the left posterior calf did require some debridement today otherwise the majority of the wounds did not require any debridement. The this was due to slough buildup on the surface. She also has a small skin flap where two of the ulcers actually connected and this has loosened up I'm concerned that things may not heal well with that flap but for the time being I'm not gonna do anything different in that regard. 02/23/17 on evaluation today patient's wound actually appears to be doing fairly well in regard to the remaining left lateral ulcer which we have been treated. With that being said it does appear that her wrap actually slid down the wood bit over the past few days at least that there's actually some friction injury and blistering noted on the medial aspect of her malleolus of the left lower extremity. She continues to use the Juxta- Lite for the right lower extremity. With that being said no debridement is  necessary today and these are very superficial injuries as far as the new injuries are concerned. 03/02/17 on evaluation today patient appears to be doing excellent in regard to her left lateral lower from the ulcers. In fact it appears that she is almost completely healed in regard to the ulcer we have been following and the new injuries which were caused by the wraps slipping down last week have been completely resolved which is excellent news. Overall I'm definitely pleased with the progress that she has made. Fortunately the rat did not cause any irritation as it did previously with the Unna wrap. 03/09/17 on evaluation today patient's wound appears to be completely healed. Obviously this is great news. She and her husband both are extremely pleased and excited to finally have this gone. Obviously she has been dealing with this wound for quite some time. Readmission: 04/14/2019 upon evaluation today patient appears for reevaluation here in our clinic although it has been since March 2019 since I last saw her. She unfortunately is having issues similar to what she had previous which are blisters and ulcerations of the bilateral lower extremities. Fortunately there is no evidence of active infection at this time based on what I am seeing. Unfortunately she is still having a lot of drainage and weeping. I do believe she needs to be compression wrap in order to get this under control and she is previously tolerated a 3 layer compression wrap without any complications whatsoever. No fevers, chills, nausea, vomiting, or diarrhea. [name], [name] ([id number]) 04/28/2019 upon evaluation today patient appears to be doing well with regard to the bilateral lower  extremities at this time. She has been tolerating the dressing changes without complication. Fortunately there is no signs of active infection at this time. No fevers, chills, nausea, vomiting, or diarrhea. 05/05/2019 upon evaluation today patient appears  to be doing excellent in regard to her legs. She has a lot of new skin growth and overall seems to be progressing quite nicely. Fortunately there is no signs of active infection at this time. No fevers, chills, nausea, vomiting, or diarrhea. 05/12/2019 upon evaluation today patient appears to be doing very well with regard to her lower extremities. She is not completely healed at this point but nonetheless he is doing much better even compared to last week. I am very pleased with where things stand. No fevers, chills, nausea, vomiting, or diarrhea. 05/26/2019 upon evaluation today patient actually appears to be doing worse with regard to her lower extremities. She has green drainage unfortunately is having more pain on the right lower extremity as well. Fortunately there is no signs of active infection systemically which is good news. 06/03/2019 upon evaluation today patient appears to be doing about the same in regard to the fact that she still has wounds on both lower extremities although all wounds are measuring better and appearing much better. She finally did start taking the medication which has made a big difference for her that is the Levaquin. Unfortunately this appears to be making her urinate much more frequently. She has been taking this in the evening therefore it is keeping her up at night. They are wondering if they can switch to taking this during the [name]. I would actually never instructed that they had to take this at night and I think it is definitely fine for her to take it in the morning. She will start that tomorrow. 06/10/2019 upon evaluation today patient appears to be doing excellent in regard to her lower extremities currently. The right lower extremity actually appears to be pretty much healed based on what I am seeing I do not see anything open or draining. The left lower extremity though not completely healed does appear to be doing much better which is great news. Overall there  is no signs of active infection at this time. 06/17/2019 upon evaluation today patient appears to be doing excellent in regard to her ulcers. She has been tolerating the dressing changes without complication in fact everything appears to be almost completely healed today. There is one area on the left lower extremity which appears to potentially still be weeping slightly and I think that we do need to see about wrapping her 1 more week just to make sure everything tightens up before discharge. 06/24/2019 upon evaluation today patient appears to be doing excellent in regard to her bilateral lower extremities. There does not appear to be any evidence of active infection which is great news and overall I am extremely pleased with the fact that again she appears to be ready for discharge. Readmission: 12/11/2019 patient is here for readmission today concerning her bilateral lower extremities. We have not seen her since June 2021. With that being said for the past 2 months apparently she has been having issues with her legs. It was noted by her husband that unfortunately he attempted to call to get an appointment with [place] but was not able to get anything in a sufficient amount of time to get her in sooner due to the fact that again we were having issues with staffing and scheduling. Therefore it was delayed getting him  into the wound care center for his wife. Nonetheless she appears to have evidence of cellulitis today of the bilateral lower extremities. Unfortunately she does not seem to be doing too well she is having a tremendous amount of pain and is asking for pain medication. Obviously it does look like her legs hurt although her wounds appear to be very superficial I am more concerned about the cellulitis aspect of this as opposed to the actual wound openings. 01/06/2020 upon evaluation today patient's legs actually appear to be doing significantly better as compared to last time I saw her when we  were seeing her back for the first evaluation and actually had to send her to the ER for further evaluation. She was admitted to the hospital and was actually there from 12/12/2019 through 12/15/2019. With that being said the patient was placed on IV antibiotics initially, Ancef and IV fluids. Subsequently upon discharge she was given Keflex. With that being said she is getting need an extension of the Keflex as she was only given 3 days in 1 to make sure this completely resolves and does not cause her any additional issues or concerns here. I will send that in for them today. 01/13/2020 upon evaluation today patient appears to be doing decently well in regard to her legs. The areas where she had wounds previously have actually improved which is great news. There does not appear to be any signs of active infection at this time which is great news. With IV NSAIDs she did have some itching on the top of her foot that prompted them to remove the dressing/compression wrap last week because she was just absolutely miserable over the weekend. Nonetheless I do think that we can do some things to try to help out here there may be a little bit of a fungal and for. We are going to treat that accordingly. 01/20/2020 upon evaluation today patient actually appears to be doing well with regard to her legs. In general I am very pleased with how things seem to be progressing. I do not see any signs of active infection at this time which is great news. Overall I think the patient is pleased as well she is not really having any pain and she tells me that itching is greatly improved 01/27/2020 upon evaluation today patient appears to be doing excellent in regard to her leg ulcers that were previously present. They have healed and seem to be doing great I see no signs of infection at this time. No fevers, chills, nausea, vomiting, or diarrhea. With that being said I do believe that the patient is making good progress but  still seems to be very fluid overloaded. We discussed the possibility that she may need to be back on the Lasix she was taken off this at the hospital but nonetheless I think she needs to have something on board to help with her edema. Otherwise I think she is going to continue to have significant issues with new blisters forming as we see this week Electronic Signature(s) Signed: 01/27/2020 2:47:29 PM By: Worthy Keeler PA-C Entered By: Worthy Keeler on 01/27/2020 14:47:29 [name], [name] ([id number]) -------------------------------------------------------------------------------- Physical Exam Details Patient Name: EMREE, LOCICERO B. Date of Service: 01/27/2020 1:30 PM Medical Record Number: [id number] Patient Account Number: [id number] Date of Birth/Sex: Sep 24, 1945 (75 y.o. F) Treating RN: Cornell Barman Primary Care Provider: Lelon Huh Other Clinician: Referring Provider: Lelon Huh Treating Provider/Extender: Skipper Cliche in Treatment: 6 Constitutional Obese and well-hydrated in  no acute distress. Respiratory normal breathing without difficulty. Psychiatric this patient is able to make decisions and demonstrates good insight into disease process. Alert and Oriented x 3. pleasant and cooperative. Notes His wound bed actually showed signs of good granulation at this time. There does not appear to be any signs of active infection which is good news overall I do think the compression wraps are helping though they are not taking care of all of the edema unfortunately. Electronic Signature(s) Signed: 01/27/2020 2:47:43 PM By: Worthy Keeler PA-C Entered By: Worthy Keeler on 01/27/2020 14:47:42 [name] ([id number]) -------------------------------------------------------------------------------- Physician Orders Details Patient Name: [name] Date of Service: 01/27/2020 1:30 PM Medical Record Number: [id number] Patient Account Number: [id number] Date of Birth/Sex:  August 10, 1945 (75 y.o. F) Treating RN: Dolan Amen Primary Care Provider: Lelon Huh Other Clinician: Referring Provider: Lelon Huh Treating Provider/Extender: Skipper Cliche in Treatment: 6 Verbal / Phone Orders: No Diagnosis Coding ICD-10 Coding Code Description I89.0 Lymphedema, not elsewhere classified I87.2 Venous insufficiency (chronic) (peripheral) L03.115 Cellulitis of right lower limb L03.116 Cellulitis of left lower limb Follow-up Appointments o Return Appointment in 1 week. o Nurse Visit as needed Bathing/ Shower/ Hygiene o May shower with wound dressing protected with water repellent cover or cast protector. Edema Control - Lymphedema / Segmental Compressive Device / Other Bilateral Lower Extremities o Optional: One layer of unna paste to top of compression wrap (to act as an anchor). o 3 Layer Compression System for Lymphedema. o Elevate legs to the level of the heart and pump ankles as often as possible o Elevate leg(s) parallel to the floor when sitting. Wound Treatment Wound #24 - Lower Leg Wound Laterality: Left, Lateral, Proximal Cleanser: Soap and Water 1 x Per Week/30 Days Discharge Instructions: Gently cleanse wound with antibacterial soap, rinse and pat dry prior to dressing wounds Peri-Wound Care: Triamcinolone Acetonide Cream, 0.1%, 15 (g) tube (Generic) 1 x Per Week/30 Days Secondary Dressing: ABD Pad 5x9 (in/in) 1 x Per Week/30 Days Discharge Instructions: Cover with ABD pad Compression Wrap: Profore Lite LF 3 Multilayer Compression Bandaging System (Generic) 1 x Per Week/30 Days Discharge Instructions: Apply 3 multi-layer wrap as prescribed. Compression Wrap: Unna w/Calamine, 4x10 (in/yd) (Generic) 1 x Per Week/30 Days Discharge Instructions: Apply unna paste -3 finger-widths below knee to anchor compression wrap in place. Wound #25 - Ankle Wound Laterality: Left, Lateral Cleanser: Soap and Water 1 x Per Week/30 Days Discharge  Instructions: Gently cleanse wound with antibacterial soap, rinse and pat dry prior to dressing wounds Peri-Wound Care: Triamcinolone Acetonide Cream, 0.1%, 15 (g) tube (Generic) 1 x Per Week/30 Days Secondary Dressing: ABD Pad 5x9 (in/in) 1 x Per Week/30 Days Discharge Instructions: Cover with ABD pad Compression Wrap: Profore Lite LF 3 Multilayer Compression Bandaging System (Generic) 1 x Per Week/30 Days Discharge Instructions: Apply 3 multi-layer wrap as prescribed. Compression Wrap: Unna w/Calamine, 4x10 (in/yd) (Generic) 1 x Per Week/30 Days Discharge Instructions: Apply unna paste -3 finger-widths below knee to anchor compression wrap in place. [name], [name] ([id number]) Electronic Signature(s) Signed: 01/27/2020 2:50:05 PM By: Georges Mouse, Minus Breeding RN Signed: 01/27/2020 3:31:15 PM By: Worthy Keeler PA-C Entered By: Georges Mouse, Minus Breeding on 01/27/2020 14:11:10 [name] ([id number]) -------------------------------------------------------------------------------- Problem List Details Patient Name: SHAWNDRA, CLUTE. Date of Service: 01/27/2020 1:30 PM Medical Record Number: [id number] Patient Account Number: [id number] Date of Birth/Sex: 12/29/1945 (75 y.o. F) Treating RN: Cornell Barman Primary Care Provider: Lelon Huh Other Clinician: Referring Provider: Lelon Huh  Treating Provider/Extender: Jeri Cos Weeks in Treatment: 6 Active Problems ICD-10 Encounter Code Description Active Date MDM Diagnosis I89.0 Lymphedema, not elsewhere classified 12/11/2019 No Yes I87.2 Venous insufficiency (chronic) (peripheral) 12/11/2019 No Yes L03.115 Cellulitis of right lower limb 12/11/2019 No Yes L03.116 Cellulitis of left lower limb 12/11/2019 No Yes Inactive Problems Resolved Problems Electronic Signature(s) Signed: 01/27/2020 1:11:20 PM By: Worthy Keeler PA-C Entered By: Worthy Keeler on 01/27/2020 13:11:19 [name]  ([id number]) -------------------------------------------------------------------------------- Progress Note Details Patient Name: [name] Date of Service: 01/27/2020 1:30 PM Medical Record Number: [id number] Patient Account Number: [id number] Date of Birth/Sex: [birth year] (75 y.o. F) Treating RN: Cornell Barman Primary Care Provider: Lelon Huh Other Clinician: Referring Provider: Lelon Huh Treating Provider/Extender: Skipper Cliche in Treatment: 6 Subjective Chief Complaint Information obtained from Patient Patient seen for complaints of Non-Healing Wounds to both lower extremities History of Present Illness (HPI) The following HPI elements were documented for the patient's wound: Location: both lower extremity swelling with ulceration 75 year old patient who sees her PCP Dr. Lelon Huh was recently evaluated 10 days ago for diabetes mellitus, hypertension, CHF and hyperlipidemia. she also was noted to have ulcerations develop in her legs and she has been applying Silvadene dressings locally. In the past she has refused wound care referrals.her cardiologist Dr. Saunders Revel saw her and put her on 40 mg of furosemide daily. last hemoglobin A1c was 7.7%. she was also placed on ciprofloxacin twice daily for 7 days and a urine culture was recommended. past medical history significant for coronary artery disease, diabetes mellitus, nonischemic cardiomyopathy and pulmonary hypertension. She is also status post heart catheterization and coronary angiography, tubal ligation and breast cyst removal in the past. She has never been a smoker. Patient had arterial studies done which showed bilateral ABIs are artificially elevated due to noncompressible and calcified vessels. Triphasic waveform throughout. Right great toe TBI is elevated while the left is normal. 10/23/2016 -- the patient is rather moribund from several issues including chronic back pain and knee pain and swelling of her legs.  The large necrotic area on her left lateral anterior calf was bleeding on touch after washing her leg. There was a spot which needed silver nitrate cauterization and this was done appropriately. 11/06/2016 -- she is again noted to have friable bleeding from the left lower extremity wounds and this again had to be cauterized with silver nitrate to control the bleeding as pressure itself would not do it. 11/20/2016 -- the right lower extremity is completely healed and we have ordered 30-40 mm compression stocking's in both the dural layer and also a pair of juxta lites. 12/18/2016 -- she has not been wearing her compression stockings on her right leg and these have opened out into ulcerations again. Her left lower extremity has circumferential ulcerations now. I believe at this stage I would like to get her venous reflux studies done to make certain that there is no fixable superficial venous reflux. 12/29/2016 -- she has made an excellent recovery having continued with her appropriate doses of diuretics and elevation and exercise. She has not yet received her right lower extremity juxta lites. Her venous duplex study is at the end of January 01/13/16 on evaluation today patient's wounds appeared to be overall doing much better much less hyper granular than previous week's evaluation. The Hydrofera Blue Dressing's to be doing very well. She does tell me that she is having a little bit more discomfort in the posterior aspect of her leg where  she has a wound at this point. Fortunately there appears to be no infection. 01/19/17 on evaluation today patient appears to be doing very well in regard to her bilateral lower extremity swelling and at this point in time her left lower extremity ulcers. Her wounds appear to be doing much better. She has been tolerating the dressing's at this point fortunately she did get the Juxta-Lite compression as of today as well. Nonetheless I am pleased that she has been  tolerating everything so well and that her wounds looks so good. In fact she is an excellent granular surface no evidence of slough covering and I do not see any reason for likely debridement today. Especially in regard to the posterior leg. 01/26/17 on evaluation today patient appears to be doing decently well in regard to her wounds. The surface of the majority of her wounds is greatly improved. She has been using the Juxta-Lite compression which is excellent. Nonetheless the wound of the left posterior calf did require some debridement today otherwise the majority of the wounds did not require any debridement. The this was due to slough buildup on the surface. She also has a small skin flap where two of the ulcers actually connected and this has loosened up I'm concerned that things may not heal well with that flap but for the time being I'm not gonna do anything different in that regard. 02/23/17 on evaluation today patient's wound actually appears to be doing fairly well in regard to the remaining left lateral ulcer which we have been treated. With that being said it does appear that her wrap actually slid down the wood bit over the past few days at least that there's actually some friction injury and blistering noted on the medial aspect of her malleolus of the left lower extremity. She continues to use the Juxta- Lite for the right lower extremity. With that being said no debridement is necessary today and these are very superficial injuries as far as the new injuries are concerned. 03/02/17 on evaluation today patient appears to be doing excellent in regard to her left lateral lower from the ulcers. In fact it appears that she is almost completely healed in regard to the ulcer we have been following and the new injuries which were caused by the wraps slipping down last week have been completely resolved which is excellent news. Overall I'm definitely pleased with the progress that she has made.  Fortunately the rat did not cause any irritation as it did previously with the Unna wrap. 03/09/17 on evaluation today patient's wound appears to be completely healed. Obviously this is great news. She and her husband both are extremely pleased and excited to finally have this gone. Obviously she has been dealing with this wound for quite some time. Readmission: [name], [name] ([id number]) 04/14/2019 upon evaluation today patient appears for reevaluation here in our clinic although it has been since March 2019 since I last saw her. She unfortunately is having issues similar to what she had previous which are blisters and ulcerations of the bilateral lower extremities. Fortunately there is no evidence of active infection at this time based on what I am seeing. Unfortunately she is still having a lot of drainage and weeping. I do believe she needs to be compression wrap in order to get this under control and she is previously tolerated a 3 layer compression wrap without any complications whatsoever. No fevers, chills, nausea, vomiting, or diarrhea. 04/28/2019 upon evaluation today patient appears to be doing  well with regard to the bilateral lower extremities at this time. She has been tolerating the dressing changes without complication. Fortunately there is no signs of active infection at this time. No fevers, chills, nausea, vomiting, or diarrhea. 05/05/2019 upon evaluation today patient appears to be doing excellent in regard to her legs. She has a lot of new skin growth and overall seems to be progressing quite nicely. Fortunately there is no signs of active infection at this time. No fevers, chills, nausea, vomiting, or diarrhea. 05/12/2019 upon evaluation today patient appears to be doing very well with regard to her lower extremities. She is not completely healed at this point but nonetheless he is doing much better even compared to last week. I am very pleased with where things stand. No fevers,  chills, nausea, vomiting, or diarrhea. 05/26/2019 upon evaluation today patient actually appears to be doing worse with regard to her lower extremities. She has green drainage unfortunately is having more pain on the right lower extremity as well. Fortunately there is no signs of active infection systemically which is good news. 06/03/2019 upon evaluation today patient appears to be doing about the same in regard to the fact that she still has wounds on both lower extremities although all wounds are measuring better and appearing much better. She finally did start taking the medication which has made a big difference for her that is the Levaquin. Unfortunately this appears to be making her urinate much more frequently. She has been taking this in the evening therefore it is keeping her up at night. They are wondering if they can switch to taking this during the [name]. I would actually never instructed that they had to take this at night and I think it is definitely fine for her to take it in the morning. She will start that tomorrow. 06/10/2019 upon evaluation today patient appears to be doing excellent in regard to her lower extremities currently. The right lower extremity actually appears to be pretty much healed based on what I am seeing I do not see anything open or draining. The left lower extremity though not completely healed does appear to be doing much better which is great news. Overall there is no signs of active infection at this time. 06/17/2019 upon evaluation today patient appears to be doing excellent in regard to her ulcers. She has been tolerating the dressing changes without complication in fact everything appears to be almost completely healed today. There is one area on the left lower extremity which appears to potentially still be weeping slightly and I think that we do need to see about wrapping her 1 more week just to make sure everything tightens up before discharge. 06/24/2019 upon  evaluation today patient appears to be doing excellent in regard to her bilateral lower extremities. There does not appear to be any evidence of active infection which is great news and overall I am extremely pleased with the fact that again she appears to be ready for discharge. Readmission: 12/11/2019 patient is here for readmission today concerning her bilateral lower extremities. We have not seen her since June 2021. With that being said for the past 2 months apparently she has been having issues with her legs. It was noted by her husband that unfortunately he attempted to call to get an appointment with [place] but was not able to get anything in a sufficient amount of time to get her in sooner due to the fact that again we were having issues with staffing and  scheduling. Therefore it was delayed getting him into the wound care center for his wife. Nonetheless she appears to have evidence of cellulitis today of the bilateral lower extremities. Unfortunately she does not seem to be doing too well she is having a tremendous amount of pain and is asking for pain medication. Obviously it does look like her legs hurt although her wounds appear to be very superficial I am more concerned about the cellulitis aspect of this as opposed to the actual wound openings. 01/06/2020 upon evaluation today patient's legs actually appear to be doing significantly better as compared to last time I saw her when we were seeing her back for the first evaluation and actually had to send her to the ER for further evaluation. She was admitted to the hospital and was actually there from 12/12/2019 through 12/15/2019. With that being said the patient was placed on IV antibiotics initially, Ancef and IV fluids. Subsequently upon discharge she was given Keflex. With that being said she is getting need an extension of the Keflex as she was only given 3 days in 1 to make sure this completely resolves and does not cause her any  additional issues or concerns here. I will send that in for them today. 01/13/2020 upon evaluation today patient appears to be doing decently well in regard to her legs. The areas where she had wounds previously have actually improved which is great news. There does not appear to be any signs of active infection at this time which is great news. With IV NSAIDs she did have some itching on the top of her foot that prompted them to remove the dressing/compression wrap last week because she was just absolutely miserable over the weekend. Nonetheless I do think that we can do some things to try to help out here there may be a little bit of a fungal and for. We are going to treat that accordingly. 01/20/2020 upon evaluation today patient actually appears to be doing well with regard to her legs. In general I am very pleased with how things seem to be progressing. I do not see any signs of active infection at this time which is great news. Overall I think the patient is pleased as well she is not really having any pain and she tells me that itching is greatly improved 01/27/2020 upon evaluation today patient appears to be doing excellent in regard to her leg ulcers that were previously present. They have healed and seem to be doing great I see no signs of infection at this time. No fevers, chills, nausea, vomiting, or diarrhea. With that being said I do believe that the patient is making good progress but still seems to be very fluid overloaded. We discussed the possibility that she may need to be back on the Lasix she was taken off this at the hospital but nonetheless I think she needs to have something on board to help with her edema. Otherwise I think she is going to continue to have significant issues with new blisters forming as we see this week [name], Jillian B. ([id number]) Objective Constitutional Obese and well-hydrated in no acute distress. Vitals Time Taken: 1:31 PM, Height: 64 in, Weight: 205 lbs,  BMI: 35.2, Temperature: 98.2 F, Pulse: 65 bpm, Respiratory Rate: 18 breaths/min, Blood Pressure: 176/97 mmHg. Respiratory normal breathing without difficulty. Psychiatric this patient is able to make decisions and demonstrates good insight into disease process. Alert and Oriented x 3. pleasant and cooperative. General Notes: His wound bed actually  showed signs of good granulation at this time. There does not appear to be any signs of active infection which is good news overall I do think the compression wraps are helping though they are not taking care of all of the edema unfortunately. Integumentary (Hair, Skin) Wound #21 status is Healed - Epithelialized. Original cause of wound was Gradually Appeared. The wound is located on the Left,Lateral Lower Leg. The wound measures 0cm length x 0cm width x 0cm depth; 0cm^2 area and 0cm^3 volume. Wound #22 status is Healed - Epithelialized. Original cause of wound was Gradually Appeared. The wound is located on the Right Ankle. The wound measures 0cm length x 0cm width x 0cm depth; 0cm^2 area and 0cm^3 volume. Wound #23 status is Healed - Epithelialized. Original cause of wound was Gradually Appeared. The wound is located on the Left,Medial Ankle. The wound measures 0cm length x 0cm width x 0cm depth; 0cm^2 area and 0cm^3 volume. Wound #24 status is Open. Original cause of wound was Gradually Appeared. The wound is located on the Left,Proximal,Lateral Lower Leg. The wound measures 2.2cm length x 0.4cm width x 0.1cm depth; 0.691cm^2 area and 0.069cm^3 volume. There is Fat Layer (Subcutaneous Tissue) exposed. There is no tunneling or undermining noted. There is a medium amount of sanguinous drainage noted. There is large (67-100%) red granulation within the wound bed. There is no necrotic tissue within the wound bed. Wound #25 status is Open. Original cause of wound was Gradually Appeared. The wound is located on the Left,Lateral Ankle. The wound measures  1.5cm length x 1.5cm width x 0.1cm depth; 1.767cm^2 area and 0.177cm^3 volume. There is Fat Layer (Subcutaneous Tissue) exposed. There is no tunneling or undermining noted. There is a medium amount of serous drainage noted. The wound margin is flat and intact. There is large (67-100%) pink granulation within the wound bed. There is no necrotic tissue within the wound bed. Assessment Active Problems ICD-10 Lymphedema, not elsewhere classified Venous insufficiency (chronic) (peripheral) Cellulitis of right lower limb Cellulitis of left lower limb Procedures Wound #24 Pre-procedure diagnosis of Wound #24 is a Lymphedema located on the Left,Proximal,Lateral Lower Leg . There was a Three Layer Compression Therapy Procedure by Dolan Amen, RN. Post procedure Diagnosis Wound #24: Same as Pre-Procedure Notes: pt tolerating wrap well. Wound #25 Pre-procedure diagnosis of Wound #25 is a Lymphedema located on the Left,Lateral Ankle . There was a Three Layer Compression Therapy Procedure by Dolan Amen, RN. Post procedure Diagnosis Wound #25: Same as Pre-Procedure Notes: pt tolerating wrap well. There was a Three Layer Compression Therapy Procedure by Dolan Amen, RN. Post procedure Diagnosis Wound #: Same as Pre-Procedure Notes: pt tolerating wrap well. [name], [name] ([id number]) Plan Follow-up Appointments: Return Appointment in 1 week. Nurse Visit as needed Bathing/ Shower/ Hygiene: May shower with wound dressing protected with water repellent cover or cast protector. Edema Control - Lymphedema / Segmental Compressive Device / Other: Optional: One layer of unna paste to top of compression wrap (to act as an anchor). 3 Layer Compression System for Lymphedema. Elevate legs to the level of the heart and pump ankles as often as possible Elevate leg(s) parallel to the floor when sitting. WOUND #24: - Lower Leg Wound Laterality: Left, Lateral, Proximal Cleanser: Soap and Water 1 x  Per Week/30 Days Discharge Instructions: Gently cleanse wound with antibacterial soap, rinse and pat dry prior to dressing wounds Peri-Wound Care: Triamcinolone Acetonide Cream, 0.1%, 15 (g) tube (Generic) 1 x Per Week/30 Days Secondary Dressing: ABD Pad  5x9 (in/in) 1 x Per Week/30 Days Discharge Instructions: Cover with ABD pad Compression Wrap: Profore Lite LF 3 Multilayer Compression Bandaging System (Generic) 1 x Per Week/30 Days Discharge Instructions: Apply 3 multi-layer wrap as prescribed. Compression Wrap: Unna w/Calamine, 4x10 (in/yd) (Generic) 1 x Per Week/30 Days Discharge Instructions: Apply unna paste -3 finger-widths below knee to anchor compression wrap in place. WOUND #25: - Ankle Wound Laterality: Left, Lateral Cleanser: Soap and Water 1 x Per Week/30 Days Discharge Instructions: Gently cleanse wound with antibacterial soap, rinse and pat dry prior to dressing wounds Peri-Wound Care: Triamcinolone Acetonide Cream, 0.1%, 15 (g) tube (Generic) 1 x Per Week/30 Days Secondary Dressing: ABD Pad 5x9 (in/in) 1 x Per Week/30 Days Discharge Instructions: Cover with ABD pad Compression Wrap: Profore Lite LF 3 Multilayer Compression Bandaging System (Generic) 1 x Per Week/30 Days Discharge Instructions: Apply 3 multi-layer wrap as prescribed. Compression Wrap: Unna w/Calamine, 4x10 (in/yd) (Generic) 1 x Per Week/30 Days Discharge Instructions: Apply unna paste -3 finger-widths below knee to anchor compression wrap in place. 1. Would recommend currently that we going to continue with the wound care measures as before and the patient is in agreement with the plan this includes the use of a 3 layer compression wrap with ABD pads. 2. I am also can recommend the patient should contact her primary care provider she will be seeing her cardiologist tomorrow I really think she needs to be on some kind of fluid pill though to help get some of the fluid in general off to help with edema  control. We will see patient back for reevaluation in 1 week here in the clinic. If anything worsens or changes patient will contact our office for additional recommendations. Electronic Signature(s) Signed: 01/27/2020 2:48:14 PM By: Worthy Keeler PA-C Entered By: Worthy Keeler on 01/27/2020 14:48:14 [name] ([id number]) -------------------------------------------------------------------------------- SuperBill Details Patient Name: [name] Date of Service: 01/27/2020 Medical Record Number: [id number] Patient Account Number: [id number] Date of Birth/Sex: 1945-04-30 (75 y.o. F) Treating RN: Dolan Amen Primary Care Provider: Lelon Huh Other Clinician: Referring Provider: Lelon Huh Treating Provider/Extender: Skipper Cliche in Treatment: 6 Diagnosis Coding ICD-10 Codes Code Description I89.0 Lymphedema, not elsewhere classified I87.2 Venous insufficiency (chronic) (peripheral) L03.115 Cellulitis of right lower limb L03.116 Cellulitis of left lower limb Facility Procedures CPT4: Description Modifier Quantity Code [id number] BILATERAL: Application of multi-layer venous compression system; leg (below knee), including 1 ankle and foot. Physician Procedures CPT4 Code: [id number] Description: 34193 - WC PHYS LEVEL 3 - EST PT Modifier: Quantity: 1 CPT4 Code: Description: ICD-10 Diagnosis Description I89.0 Lymphedema, not elsewhere classified I87.2 Venous insufficiency (chronic) (peripheral) L03.115 Cellulitis of right lower limb L03.116 Cellulitis of left lower limb Modifier: Quantity: Electronic Signature(s) Signed: 01/27/2020 2:48:31 PM By: Worthy Keeler PA-C Entered By: Worthy Keeler on 01/27/2020 14:48:31

## 2020-01-28 ENCOUNTER — Ambulatory Visit: Payer: Medicare Other | Admitting: Internal Medicine

## 2020-01-28 ENCOUNTER — Encounter: Payer: Self-pay | Admitting: Internal Medicine

## 2020-01-28 ENCOUNTER — Other Ambulatory Visit: Payer: Self-pay | Admitting: Family Medicine

## 2020-01-28 VITALS — BP 160/84 | HR 66 | Ht 64.0 in | Wt 206.0 lb

## 2020-01-28 DIAGNOSIS — I509 Heart failure, unspecified: Secondary | ICD-10-CM

## 2020-01-28 DIAGNOSIS — R609 Edema, unspecified: Secondary | ICD-10-CM

## 2020-01-28 DIAGNOSIS — I350 Nonrheumatic aortic (valve) stenosis: Secondary | ICD-10-CM

## 2020-01-28 DIAGNOSIS — I5022 Chronic systolic (congestive) heart failure: Secondary | ICD-10-CM

## 2020-01-28 DIAGNOSIS — I2510 Atherosclerotic heart disease of native coronary artery without angina pectoris: Secondary | ICD-10-CM

## 2020-01-28 DIAGNOSIS — I10 Essential (primary) hypertension: Secondary | ICD-10-CM | POA: Diagnosis not present

## 2020-01-28 MED ORDER — FUROSEMIDE 40 MG PO TABS: 40.0000 mg | ORAL_TABLET | ORAL | 1 refills | Status: DC

## 2020-01-28 NOTE — Progress Notes (Signed)
Subjective:   [name] is a 75 y.o. female who presents for Medicare Annual (Subsequent) preventive examination.  I connected with Devoria Albe today by telephone and verified that I am speaking with the correct person using two identifiers. Location patient: home Location provider: work Persons participating in the virtual visit: patient, provider.   I discussed the limitations, risks, security and privacy concerns of performing an evaluation and management service by telephone and the availability of in person appointments. I also discussed with the patient that there may be a patient responsible charge related to this service. The patient expressed understanding and verbally consented to this telephonic visit.    Interactive audio and video telecommunications were attempted between this provider and patient, however failed, due to patient having technical difficulties OR patient did not have access to video capability.  We continued and completed visit with audio only.   Review of Systems    N/A  Cardiac Risk Factors include: advanced age (>31men, >35 women);diabetes mellitus;hypertension;dyslipidemia     Objective:    There were no vitals filed for this visit. There is no height or weight on file to calculate BMI.  Advanced Directives 01/29/2020 12/12/2019 12/11/2019 09/26/2018 09/24/2017 08/28/2017 05/29/2017  Does Patient Have a Medical Advance Directive? No No No Yes Yes No No  Type of Advance Directive - - Public librarian;Living will [place];Living will - -  Copy of [place] in Chart? - - - No - copy requested No - copy requested - -  Would patient like information on creating a medical advance directive? No - Patient declined No - Patient declined No - Patient declined - - No - Patient declined -    Current Medications (verified) Outpatient Encounter Medications as of 01/29/2020  Medication Sig  . albuterol (VENTOLIN  HFA) 108 (90 Base) MCG/ACT inhaler INHALE 1-2 PUFFS INTO THE LUNGS EVERY 6 (SIX) HOURS AS NEEDED FOR SHORTNESS OF BREATH.  Marland Kitchen amLODipine (NORVASC) 5 MG tablet Take 1 tablet (5 mg total) by mouth daily.  . Cholecalciferol (VITAMIN D) 50 MCG (2000 UT) tablet Take 2,000 Units by mouth daily.  . cyanocobalamin 500 MCG tablet Take 250 mcg by mouth daily.   . furosemide (LASIX) 40 MG tablet Take 1 tablet (40 mg total) by mouth every Monday, Wednesday, and Friday.  Marland Kitchen HYDROcodone-acetaminophen (NORCO) 10-325 MG tablet Take 1 tablet by mouth every 6 (six) hours as needed.  . levalbuterol (XOPENEX) 1.25 MG/3ML nebulizer solution Take 1.25 mg by nebulization every 8 (eight) hours as needed for wheezing.  . metoprolol tartrate (LOPRESSOR) 50 MG tablet Take 1.5 tablets (75 mg total) by mouth 2 (two) times daily.  Marland Kitchen oxybutynin (DITROPAN) 5 MG tablet Take 1 tablet (5 mg total) by mouth 2 (two) times daily.  . [DISCONTINUED] amLODipine (NORVASC) 2.5 MG tablet Take 1 tablet (2.5 mg total) by mouth daily.  . [DISCONTINUED] cephALEXin (KEFLEX) 500 MG capsule Take 500 mg by mouth 3 (three) times daily. (Patient not taking: Reported on 01/28/2020)  . [DISCONTINUED] collagenase (SANTYL) ointment Apply topically daily. Cleanse legs with soap and water and pat dry.  Apply Santyl ointment to scabbed lesions.  Cover with xeroform gauze and wrap with kerlix/tape. Change daily. (Patient not taking: Reported on 01/28/2020)   No facility-administered encounter medications on file as of 01/29/2020.    Allergies (verified) Hydrochlorothiazide and Penicillins   History: Past Medical History:  Diagnosis Date  . Aortic stenosis   . Asthma   .  Coronary artery disease   . Diabetes mellitus without complication ([place])   . History of measles   . Hypertension   . Nonischemic cardiomyopathy ([place])   . Pulmonary hypertension ([place])    Past Surgical History:  Procedure Laterality Date  . CARDIAC CATHETERIZATION    . Cyst(solitary)  of breast:removed    . RIGHT/LEFT HEART CATH AND CORONARY ANGIOGRAPHY N/A 03/07/2016   Procedure: Right/Left Heart Cath and Coronary Angiography;  Surgeon: Nelva Bush, MD;  Location: [facility];  Service: Cardiovascular;  Laterality: N/A;  . TUBAL LIGATION     Family History  Problem Relation Age of Onset  . Cancer Mother        Uterine cancer  . Diabetes Mother   . Heart disease Mother   . Heart failure Mother   . Parkinson's disease Father   . Cancer Sister        breast cancer  . Diabetes Brother        Non-insulin Dependent Diabetes Mellitus  . Mesothelioma Brother   . Hypertension Sister   . Diabetes Sister        Non-insulin dependent Diabetes Mellitus   Social History   Socioeconomic History  . Marital status: Married    Spouse name: Not on file  . Number of children: 2  . Years of education: Not on file  . Highest education level: High school graduate  Occupational History  . Occupation: retired  Tobacco Use  . Smoking status: Never Smoker  . Smokeless tobacco: Never Used  Vaping Use  . Vaping Use: Never used  Substance and Sexual Activity  . Alcohol use: No  . Drug use: No  . Sexual activity: Yes    Birth control/protection: None  Other Topics Concern  . Not on file  Social History Narrative  . Not on file   Social Determinants of Health   Financial Resource Strain: Low Risk   . Difficulty of Paying Living Expenses: Not hard at all  Food Insecurity: No Food Insecurity  . Worried About Charity fundraiser in the Last Year: Never true  . Ran Out of Food in the Last Year: Never true  Transportation Needs: No Transportation Needs  . Lack of Transportation (Medical): No  . Lack of Transportation (Non-Medical): No  Physical Activity: Inactive  . Days of Exercise per Week: 0 days  . Minutes of Exercise per Session: 0 min  Stress: No Stress Concern Present  . Feeling of Stress : Only a little  Social Connections: Moderately Isolated  .  Frequency of Communication with Friends and Family: More than three times a week  . Frequency of Social Gatherings with Friends and Family: Never  . Attends Religious Services: Never  . Active Member of Clubs or Organizations: No  . Attends Archivist Meetings: Never  . Marital Status: Married    Tobacco Counseling Counseling given: Not Answered   Clinical Intake:  Pre-visit preparation completed: Yes  Pain : No/denies pain     Nutritional Risks: None Diabetes: No  How often do you need to have someone help you when you read instructions, pamphlets, or other written materials from your doctor or pharmacy?: 1 - Never  Diabetic? Yes  Nutrition Risk Assessment:  Has the patient had any N/V/D within the last 2 months?  No  Does the patient have any non-healing wounds?  No  Has the patient had any unintentional weight loss or weight gain?  No   Diabetes:  Is  the patient diabetic?  Yes  If diabetic, was a CBG obtained today?  No  Did the patient bring in their glucometer from home?  No  How often do you monitor your CBG's? Does not check BS.   Financial Strains and Diabetes Management:  Are you having any financial strains with the device, your supplies or your medication? No .  Does the patient want to be seen by Chronic Care Management for management of their diabetes?  No  Would the patient like to be referred to a Nutritionist or for Diabetic Management?  No   Diabetic Exams:  Diabetic Eye Exam: Completed 10/08/19 Diabetic Foot Exam: Overdue, Pt has been advised about the importance in completing this exam.    Interpreter Needed?: No  Information entered by :: [facility], LPN   Activities of Daily Living In your present state of health, do you have any difficulty performing the following activities: 01/29/2020 12/12/2019  Hearing? N N  Vision? N N  Difficulty concentrating or making decisions? N Y  Walking or climbing stairs? Tempie Donning  Comment Avoids  steps due to being wheelchair bound. -  Dressing or bathing? Y Y  Comment Husband assists with bathing. -  Doing errands, shopping? Y Y  Comment Does not drive. -  Preparing Food and eating ? Y -  Comment Does not cook. -  Using the Toilet? Y -  Comment Husband assists to bathroom. -  In the past six months, have you accidently leaked urine? Y -  Comment On Oxybutynin -  Do you have problems with loss of bowel control? N -  Managing your Medications? Y -  Comment Husband manages all medications. -  Managing your Finances? Y -  Comment Husband manages finances. -  Housekeeping or managing your Housekeeping? Y -  Comment Does not do housework, husband does. -  Some recent data might be hidden    Patient Care Team: Birdie Sons, MD as PCP - General (Family Medicine) End, Harrell Gave, MD as PCP - Cardiology (Cardiology) Eulogio Bear, MD as Consulting Physician (Ophthalmology) [place] Morelle, PA-C as Physician Assistant (Physician Assistant) Laneta Simmers as Physician Assistant (Urology)  Indicate any recent Medical Services you may have received from other than [facility] providers in the past year (date may be approximate).     Assessment:   This is a routine wellness examination for [name].  Hearing/Vision screen No exam data present  Dietary issues and exercise activities discussed: Current Exercise Habits: The patient does not participate in regular exercise at present, Exercise limited by: orthopedic condition(s)  Goals    . DIET - REDUCE SUGAR INTAKE     Recommend to cut out junk foods and sweets in daily diet to help aid in weight loss.     . Fall Prevention     Recommend removing all area rugs from the home to prevent falls.       Depression Screen PHQ 2/9 Scores 01/29/2020 09/30/2019 09/26/2018 09/24/2017 09/21/2016 09/08/2015 07/01/2014  PHQ - 2 Score 1 0 0 0 2 0 2  PHQ- 9 Score - - - - 7 - 3    Fall Risk Fall Risk  01/29/2020 09/30/2019  03/28/2019 09/26/2018 09/24/2017  Falls in the past year? 1 1 1 1  No  Number falls in past yr: 1 1 1 1  -  Injury with Fall? 0 0 0 0 -  Risk for fall due to : Impaired balance/gait;Impaired mobility Impaired balance/gait - - -  Follow up Falls prevention discussed Falls prevention discussed - Falls prevention discussed -    FALL RISK PREVENTION PERTAINING TO THE HOME:  Any stairs in or around the home? Yes  If so, are there any without handrails? No  Home free of loose throw rugs in walkways, pet beds, electrical cords, etc? Yes  Adequate lighting in your home to reduce risk of falls? Yes   ASSISTIVE DEVICES UTILIZED TO PREVENT FALLS:  Life alert? No  Use of a cane, walker or w/c? Yes  Grab bars in the bathroom? Yes  Shower chair or bench in shower? Yes  Elevated toilet seat or a handicapped toilet? Yes    Cognitive Function: Unable to complete due to speaking with husband for visit.        Immunizations Immunization History  Administered Date(s) Administered  . Fluad Quad(high Dose 65+) 09/27/2018, 09/30/2019  . Influenza, High Dose Seasonal PF 12/02/2014, 09/08/2015, 09/21/2016, 09/24/2017  . PFIZER(Purple Top)SARS-COV-2 Vaccination 03/22/2019, 04/19/2019  . Pneumococcal Conjugate-13 05/09/2013  . Pneumococcal Polysaccharide-23 01/19/2011    TDAP status: Due, Education has been provided regarding the importance of this vaccine. Advised may receive this vaccine at local pharmacy or Health Dept. Aware to provide a copy of the vaccination record if obtained from local pharmacy or Health Dept. Verbalized acceptance and understanding.  Flu Vaccine status: Up to date  Pneumococcal vaccine status: Up to date  Covid-19 vaccine status: Completed vaccines  Qualifies for Shingles Vaccine? Yes   Zostavax completed No   Shingrix Completed?: No.    Education has been provided regarding the importance of this vaccine. Patient has been advised to call insurance company to determine out  of pocket expense if they have not yet received this vaccine. Advised may also receive vaccine at local pharmacy or Health Dept. Verbalized acceptance and understanding.  Screening Tests Health Maintenance  Topic Date Due  . FOOT EXAM  Never done  . URINE MICROALBUMIN  09/25/2018  . COVID-19 Vaccine (3 - Pfizer risk 4-dose series) 05/17/2019  . DEXA SCAN  07/10/2019  . TETANUS/TDAP  09/03/2026 (Originally 05/12/1964)  . HEMOGLOBIN A1C  06/10/2020  . OPHTHALMOLOGY EXAM  10/07/2020  . COLONOSCOPY (Pts 45-37yrs Insurance coverage will need to be confirmed)  07/10/2022  . INFLUENZA VACCINE  Completed  . Hepatitis C Screening  Completed  . PNA vac Low Risk Adult  Completed    Health Maintenance  Health Maintenance Due  Topic Date Due  . FOOT EXAM  Never done  . URINE MICROALBUMIN  09/25/2018  . COVID-19 Vaccine (3 - Pfizer risk 4-dose series) 05/17/2019  . DEXA SCAN  07/10/2019    Colorectal cancer screening: Type of screening: Colonoscopy. Completed 07/09/12. Repeat every 10 years  Mammogram status: No longer required due to age.  Bone Density status: Currently due. Declined order at this time.  Lung Cancer Screening: (Low Dose CT Chest recommended if Age 50-80 years, 30 pack-year currently smoking OR have quit w/in 15years.) does not qualify.    Additional Screening:  Hepatitis C Screening: Up to date  Vision Screening: Recommended annual ophthalmology exams for early detection of glaucoma and other disorders of the eye. Is the patient up to date with their annual eye exam?  Yes  Who is the provider or what is the name of the office in which the patient attends annual eye exams? Dr Edison Pace @ [facility] If pt is not established with a provider, would they like to be referred to a provider to establish care? No .  Dental Screening: Recommended annual dental exams for proper oral hygiene  Community Resource Referral / Chronic Care Management: CRR required this visit?  No   CCM  required this visit?  No      Plan:     I have personally reviewed and noted the following in the patient's chart:   . Medical and social history . Use of alcohol, tobacco or illicit drugs  . Current medications and supplements . Functional ability and status . Nutritional status . Physical activity . Advanced directives . List of other physicians . Hospitalizations, surgeries, and ER visits in previous 12 months . Vitals . Screenings to include cognitive, depression, and falls . Referrals and appointments  In addition, I have reviewed and discussed with patient certain preventive protocols, quality metrics, and best practice recommendations. A written personalized care plan for preventive services as well as general preventive health recommendations were provided to patient.     Ermin Parisien Samson, [place]   [id number]   Nurse Notes: Pt needs a diabetic foot exam and urine check at next in office apt. Husband declines a DEXA order or a future Covid booster at this time .

## 2020-01-28 NOTE — Progress Notes (Signed)
Follow-up Outpatient Visit Date: 01/28/2020  Primary Care Provider: Birdie Sons, MD [address] [place] [address]  Chief Complaint: Follow-up edema and hypertension  HPI:  [name] is a 75 y.o. female with history of nonobstructive coronary artery disease, chronic HFrEF with recovered ejection fraction due to nonischemic cardiomyopathy, aortic stenosis, hypertension, diabetes mellitus, and multiple myeloma, who presents for follow-up of hypertension, edema, heart failure, and coronary/valvular heart disease. I last saw her in October at which time Ms. Fletchall noticed worsening leg edema and wounds. She was taking furosemide 40 mg daily but was not elevating her legs or using compression wraps on a regular basis. I encouraged her to elevate and wrap her legs when possible. We also decreased amlodipine from 5 mg daily to 2.5 mg daily to see if that would help her swelling. She was hospitalized in mid December with worsening leg swelling and wounds complicated by cellulitis and acute kidney injury. Her furosemide and lisinopril were both held and not restarted on discharge. She was treated with IV antibiotics and referred to the wound care center.  Today, Ms. Civil reports that she is doing fairly well. Through the wound care center, her leg swelling and wounds have improved quite a bit. They are being wrapped regularly, though she still does not elevate them on a regular basis. She has occasional shortness of breath for which she uses her inhaler and nebulizer. This is at her baseline. She does not weigh herself regularly due to mobility issues. She has not had any chest pain, palpitations, or lightheadedness.  --------------------------------------------------------------------------------------------------  Cardiovascular History & Procedures: Cardiovascular Problems:  Chronic systolic and diastolic heart failure secondary to non-ischemic cardiomyopathy  Nonobstructive  coronary artery disease  Aortic stenosis  Risk Factors:  Known coronary artery disease, hypertension, hyperlipidemia, diabetes mellitus, sedentary lifestyle, obesity, and age > 34  Cath/PCI:  LHC/RHC (03/07/16): LMCA normal. LAD with 30% ostial stenosi, 30%midvessel narrowing, and 50% distal disease. LCx with 40% midvessel lesion. RCA with 30% proximal narrowing. LVEDP 26 mmHg. RA 18 (prominent "M" appearance), RV 82/21, PA mean 55, PCWP 25. Ao sat 96%, PA sat 54%, Fick Co/Ci 3.2/1.7. PVR 9.4 Wood units. No significant AoV gradient.  CV Surgery:  None  EP Procedures and Devices:  None  Non-Invasive Evaluation(s):  TTE (04/18/2019): Normal LV size with mild LVH. LVEF 60-65% with grade 1 diastolic dysfunction. Normal RV size and function. Mild pulmonary hypertension. Very mild aortic stenosis.  ABI's (07/26/16):Right:ABI notobtained, TBI 1.8. Left:ABI 1.9, TBI 1.0.Triphasic waveforms noted in both lower extremities.  TTE (07/26/16): Normal LV size with moderate LVH. LVEF 55-60% with normal wall motion. Grade 1 diastolic dysfunction. Mild aortic stenosis (mean gradient 11 mmHg). Mitral annular calcification with mild to moderate MR. Mild left atrial enlargement. Normal RV size and function. Normal PA pressure.  TTE (02/25/16): Normal LV size with moderate LVH. LVEF 25-30% with grade 2 diastolic dysfunction. Thickened aortic valve with likely moderate stenosis. MAC with mild MR. Mild LA enlargement. Mildly to moderately reduced RV contraction. Mild-moderate TR. Moderately PH (RVSP 55 mmHg).   Recent CV Pertinent Labs: Lab Results  Component Value Date   CHOL 164 09/30/2019   HDL 41 09/30/2019   LDLCALC 98 09/30/2019   TRIG 140 09/30/2019   CHOLHDL 4.0 09/30/2019   INR 1.0 03/02/2016   BNP 138.6 (H) 09/30/2019   BNP 1,592.0 (H) 02/09/2016   K 4.4 01/28/2020   BUN 31 (H) 01/28/2020   CREATININE 1.88 (H) 01/28/2020  CREATININE 1.31 (H) 12/21/2016    Past medical and  surgical history were reviewed and updated in EPIC.  Current Meds  Medication Sig  . albuterol (VENTOLIN HFA) 108 (90 Base) MCG/ACT inhaler INHALE 1-2 PUFFS INTO THE LUNGS EVERY 6 (SIX) HOURS AS NEEDED FOR SHORTNESS OF BREATH.  Marland Kitchen Cholecalciferol (VITAMIN D) 50 MCG (2000 UT) tablet Take 2,000 Units by mouth daily.  . cyanocobalamin 500 MCG tablet Take 250 mcg by mouth daily.   . furosemide (LASIX) 40 MG tablet Take 1 tablet (40 mg total) by mouth every Monday, Wednesday, and Friday.  Marland Kitchen HYDROcodone-acetaminophen (NORCO) 10-325 MG tablet Take 1 tablet by mouth every 6 (six) hours as needed.  . levalbuterol (XOPENEX) 1.25 MG/3ML nebulizer solution Take 1.25 mg by nebulization every 8 (eight) hours as needed for wheezing.  . metoprolol tartrate (LOPRESSOR) 50 MG tablet Take 1.5 tablets (75 mg total) by mouth 2 (two) times daily.  Marland Kitchen oxybutynin (DITROPAN) 5 MG tablet Take 1 tablet (5 mg total) by mouth 2 (two) times daily.  . [DISCONTINUED] amLODipine (NORVASC) 2.5 MG tablet Take 1 tablet (2.5 mg total) by mouth daily.    Allergies: Hydrochlorothiazide and Penicillins  Social History   Tobacco Use  . Smoking status: Never Smoker  . Smokeless tobacco: Never Used  Vaping Use  . Vaping Use: Never used  Substance Use Topics  . Alcohol use: No  . Drug use: No    Family History  Problem Relation Age of Onset  . Cancer Mother        Uterine cancer  . Diabetes Mother   . Heart disease Mother   . Heart failure Mother   . Parkinson's disease Father   . Cancer Sister        breast cancer  . Diabetes Brother        Non-insulin Dependent Diabetes Mellitus  . Mesothelioma Brother   . Hypertension Sister   . Diabetes Sister        Non-insulin dependent Diabetes Mellitus    Review of Systems: A 12-system review of systems was performed and was negative except as noted in the HPI.  --------------------------------------------------------------------------------------------------  Physical  Exam: BP (!) 160/84 (BP Location: Left Arm, Patient Position: Sitting, Cuff Size: Large)   Pulse 66   Ht _0  (1.626 m)   Wt 206 lb (93.4 kg)   SpO2 98%   BMI 35.36 kg/m   General: Obese woman, seated in a wheelchair. She is accompanied by her husband.. Neck: Difficult to assess JVP due to body habitus. Lungs: Clear to auscultation bilaterally without wheezes or crackles. Heart: Regular rate and rhythm with 3/6 systolic murmur. No rubs or gallops. Abdomen: Soft, nontender, nondistended. Extremities: Both calves wrapped with trace thigh edema.   Lab Results  Component Value Date   WBC 5.8 12/14/2019   HGB 8.1 (L) 12/14/2019   HCT 26.5 (L) 12/14/2019   MCV 91.4 12/14/2019   PLT 253 12/14/2019    Lab Results  Component Value Date   NA 140 01/28/2020   K 4.4 01/28/2020   CL 102 01/28/2020   CO2 28 01/28/2020   BUN 31 (H) 01/28/2020   CREATININE 1.88 (H) 01/28/2020   GLUCOSE 133 (H) 01/28/2020   ALT 7 12/11/2019    Lab Results  Component Value Date   CHOL 164 09/30/2019   HDL 41 09/30/2019   LDLCALC 98 09/30/2019   TRIG 140 09/30/2019   CHOLHDL 4.0 09/30/2019    --------------------------------------------------------------------------------------------------  ASSESSMENT AND PLAN: Chronic  HFrEF with recovered ejection fraction and chronic edema: Edema seems to be better following hospitalization with regular wrapping of both legs. I suspect the Ms. Heenan is retaining some fluid. She has had issues in the past with taking furosemide regularly due to urinary frequency. She also had recent AKI. We have agreed to begin furosemide 40 mg on Mondays, Wednesdays, and Fridays. I will check a basic metabolic panel today. We will hold off on restarting lisinopril pending results of the BMP. Continue close follow-up with the wound care clinic.  Hypertension: Blood pressure poorly controlled today. As above, we will restart furosemide 3 days a week. We will also check a basic  metabolic panel. If her renal function has returned back to baseline, I think it would be reasonable to restart lisinopril given her history of HFrEF. If creatinine is still elevated, we instead may need to escalate amlodipine again with careful monitoring of her leg swelling. Referral to nephrology would also need to be considered.  Coronary artery disease: No symptoms suggest worsening coronary insufficiency, with catheterization 2018 showing mild CAD.  Aortic stenosis: Noted to be mild on most recent echo in 04/2019. Continue close clinical follow-up with consideration of repeat echo in 1 to 2 years, sooner if symptoms worsen.  Follow-up: Return to clinic in 3 months.  Nelva Bush, MD 01/29/2020 1:43 PM

## 2020-01-28 NOTE — Patient Instructions (Signed)
Medication Instructions:  Your physician has recommended you make the following change in your medication:   RESTART Furosemide (Lasix) 40mg  daily only Monday, Wednesday, Friday - A new Rx has been sent to your pharmacy  *If you need a refill on your cardiac medications before your next appointment, please call your pharmacy*   Lab Work: Your physician recommends that you have labs TODAY: Bmet  If you have labs (blood work) drawn today and your tests are completely normal, you will receive your results only by: Marland Kitchen MyChart Message (if you have MyChart) OR . A paper copy in the mail If you have any lab test that is abnormal or we need to change your treatment, we will call you to review the results.   Testing/Procedures: None ordered   Follow-Up: At [facility], you and your health needs are our priority.  As part of our continuing mission to provide you with exceptional heart care, we have created designated Provider Care Teams.  These Care Teams include your primary Cardiologist (physician) and Advanced Practice Providers (APPs -  Physician Assistants and Nurse Practitioners) who all work together to provide you with the care you need, when you need it.  We recommend signing up for the patient portal called "MyChart".  Sign up information is provided on this After Visit Summary.  MyChart is used to connect with patients for Virtual Visits (Telemedicine).  Patients are able to view lab/test results, encounter notes, upcoming appointments, etc.  Non-urgent messages can be sent to your provider as well.   To learn more about what you can do with MyChart, go to [email or web address].    Your next appointment:   3 month(s)  The format for your next appointment:   In Person  Provider:   You may see Nelva Bush, MD or one of the following Advanced Practice Providers on your designated Care Team:    Murray Hodgkins, NP  Christell Faith, PA-C  Marrianne Mood, PA-C  Cadence  Saucier, [place]  Laurann Montana, NP

## 2020-01-29 ENCOUNTER — Encounter: Payer: Self-pay | Admitting: Internal Medicine

## 2020-01-29 ENCOUNTER — Telehealth: Payer: Self-pay | Admitting: *Deleted

## 2020-01-29 ENCOUNTER — Ambulatory Visit (INDEPENDENT_AMBULATORY_CARE_PROVIDER_SITE_OTHER): Payer: Medicare Other

## 2020-01-29 ENCOUNTER — Other Ambulatory Visit: Payer: Self-pay

## 2020-01-29 DIAGNOSIS — Z0000 Encounter for general adult medical examination without abnormal findings: Secondary | ICD-10-CM | POA: Diagnosis not present

## 2020-01-29 LAB — BASIC METABOLIC PANEL
BUN/Creatinine Ratio: 16 (ref 12–28)
BUN: 31 mg/dL — ABNORMAL HIGH (ref 8–27)
CO2: 28 mmol/L (ref 20–29)
Calcium: 9.2 mg/dL (ref 8.7–10.3)
Chloride: 102 mmol/L (ref 96–106)
Creatinine, Ser: 1.88 mg/dL — ABNORMAL HIGH (ref 0.57–1.00)
GFR calc Af Amer: 30 mL/min/{1.73_m2} — ABNORMAL LOW (ref >59)
GFR calc non Af Amer: 26 mL/min/{1.73_m2} — ABNORMAL LOW (ref >59)
Glucose: 133 mg/dL — ABNORMAL HIGH (ref 65–99)
Potassium: 4.4 mmol/L (ref 3.5–5.2)
Sodium: 140 mmol/L (ref 134–144)

## 2020-01-29 MED ORDER — AMLODIPINE BESYLATE 5 MG PO TABS: 5.0000 mg | ORAL_TABLET | Freq: Every day | ORAL | 1 refills | Status: DC

## 2020-01-29 NOTE — Patient Instructions (Signed)
[name] , Thank you for taking time to come for your Medicare Wellness Visit. I appreciate your ongoing commitment to your health goals. Please review the following plan we discussed and let me know if I can assist you in the future.   Screening recommendations/referrals: Colonoscopy: Up to date, due 07/2022 Mammogram: No longer required.  Bone Density: Currently due, declined order today.  Recommended yearly ophthalmology/optometry visit for glaucoma screening and checkup Recommended yearly dental visit for hygiene and checkup  Vaccinations: Influenza vaccine: Done 09/30/19 Pneumococcal vaccine: Completed series Tdap vaccine: Currently due, declined receiving. Shingles vaccine: Shingrix discussed. Please contact your pharmacy for coverage information.     Advanced directives: Advance directive discussed with you today. Even though you declined this today please call our office should you change your mind and we can give you the proper paperwork for you to fill out.  Conditions/risks identified: Fall risk preventatives discussed today.  Next appointment: 02/16/20 @ 11:00 AM with Dr Caryn Section    Preventive Care 14 Years and Older, Female Preventive care refers to lifestyle choices and visits with your health care provider that can promote health and wellness. What does preventive care include?  A yearly physical exam. This is also called an annual well check.  Dental exams once or twice a year.  Routine eye exams. Ask your health care provider how often you should have your eyes checked.  Personal lifestyle choices, including:  Daily care of your teeth and gums.  Regular physical activity.  Eating a healthy diet.  Avoiding tobacco and drug use.  Limiting alcohol use.  Practicing safe sex.  Taking low-dose aspirin every day.  Taking vitamin and mineral supplements as recommended by your health care provider. What happens during an annual well check? The services and  screenings done by your health care provider during your annual well check will depend on your age, overall health, lifestyle risk factors, and family history of disease. Counseling  Your health care provider may ask you questions about your:  Alcohol use.  Tobacco use.  Drug use.  Emotional well-being.  Home and relationship well-being.  Sexual activity.  Eating habits.  History of falls.  Memory and ability to understand (cognition).  Work and work Statistician.  Reproductive health. Screening  You may have the following tests or measurements:  Height, weight, and BMI.  Blood pressure.  Lipid and cholesterol levels. These may be checked every 5 years, or more frequently if you are over 55 years old.  Skin check.  Lung cancer screening. You may have this screening every year starting at age 35 if you have a 30-pack-year history of smoking and currently smoke or have quit within the past 15 years.  Fecal occult blood test (FOBT) of the stool. You may have this test every year starting at age 50.  Flexible sigmoidoscopy or colonoscopy. You may have a sigmoidoscopy every 5 years or a colonoscopy every 10 years starting at age 80.  Hepatitis C blood test.  Hepatitis B blood test.  Sexually transmitted disease (STD) testing.  Diabetes screening. This is done by checking your blood sugar (glucose) after you have not eaten for a while (fasting). You may have this done every 1-3 years.  Bone density scan. This is done to screen for osteoporosis. You may have this done starting at age 67.  Mammogram. This may be done every 1-2 years. Talk to your health care provider about how often you should have regular mammograms. Talk with your health care  provider about your test results, treatment options, and if necessary, the need for more tests. Vaccines  Your health care provider may recommend certain vaccines, such as:  Influenza vaccine. This is recommended every  year.  Tetanus, diphtheria, and acellular pertussis (Tdap, Td) vaccine. You may need a Td booster every 10 years.  Zoster vaccine. You may need this after age 77.  Pneumococcal 13-valent conjugate (PCV13) vaccine. One dose is recommended after age 71.  Pneumococcal polysaccharide (PPSV23) vaccine. One dose is recommended after age 68. Talk to your health care provider about which screenings and vaccines you need and how often you need them. This information is not intended to replace advice given to you by your health care provider. Make sure you discuss any questions you have with your health care provider. Document Released: 01/15/2015 Document Revised: 09/08/2015 Document Reviewed: 10/20/2014 Elsevier Interactive Patient Education  2017 [place] Prevention in the Home Falls can cause injuries. They can happen to people of all ages. There are many things you can do to make your home safe and to help prevent falls. What can I do on the outside of my home?  Regularly fix the edges of walkways and driveways and fix any cracks.  Remove anything that might make you trip as you walk through a door, such as a raised step or threshold.  Trim any bushes or trees on the path to your home.  Use bright outdoor lighting.  Clear any walking paths of anything that might make someone trip, such as rocks or tools.  Regularly check to see if handrails are loose or broken. Make sure that both sides of any steps have handrails.  Any raised decks and porches should have guardrails on the edges.  Have any leaves, snow, or ice cleared regularly.  Use sand or salt on walking paths during winter.  Clean up any spills in your garage right away. This includes oil or grease spills. What can I do in the bathroom?  Use night lights.  Install grab bars by the toilet and in the tub and shower. Do not use towel bars as grab bars.  Use non-skid mats or decals in the tub or shower.  If you  need to sit down in the shower, use a plastic, non-slip stool.  Keep the floor dry. Clean up any water that spills on the floor as soon as it happens.  Remove soap buildup in the tub or shower regularly.  Attach bath mats securely with double-sided non-slip rug tape.  Do not have throw rugs and other things on the floor that can make you trip. What can I do in the bedroom?  Use night lights.  Make sure that you have a light by your bed that is easy to reach.  Do not use any sheets or blankets that are too big for your bed. They should not hang down onto the floor.  Have a firm chair that has side arms. You can use this for support while you get dressed.  Do not have throw rugs and other things on the floor that can make you trip. What can I do in the kitchen?  Clean up any spills right away.  Avoid walking on wet floors.  Keep items that you use a lot in easy-to-reach places.  If you need to reach something above you, use a strong step stool that has a grab bar.  Keep electrical cords out of the way.  Do not use floor polish  or wax that makes floors slippery. If you must use wax, use non-skid floor wax.  Do not have throw rugs and other things on the floor that can make you trip. What can I do with my stairs?  Do not leave any items on the stairs.  Make sure that there are handrails on both sides of the stairs and use them. Fix handrails that are broken or loose. Make sure that handrails are as long as the stairways.  Check any carpeting to make sure that it is firmly attached to the stairs. Fix any carpet that is loose or worn.  Avoid having throw rugs at the top or bottom of the stairs. If you do have throw rugs, attach them to the floor with carpet tape.  Make sure that you have a light switch at the top of the stairs and the bottom of the stairs. If you do not have them, ask someone to add them for you. What else can I do to help prevent falls?  Wear shoes  that:  Do not have high heels.  Have rubber bottoms.  Are comfortable and fit you well.  Are closed at the toe. Do not wear sandals.  If you use a stepladder:  Make sure that it is fully opened. Do not climb a closed stepladder.  Make sure that both sides of the stepladder are locked into place.  Ask someone to hold it for you, if possible.  Clearly mark and make sure that you can see:  Any grab bars or handrails.  First and last steps.  Where the edge of each step is.  Use tools that help you move around (mobility aids) if they are needed. These include:  Canes.  Walkers.  Scooters.  Crutches.  Turn on the lights when you go into a dark area. Replace any light bulbs as soon as they burn out.  Set up your furniture so you have a clear path. Avoid moving your furniture around.  If any of your floors are uneven, fix them.  If there are any pets around you, be aware of where they are.  Review your medicines with your doctor. Some medicines can make you feel dizzy. This can increase your chance of falling. Ask your doctor what other things that you can do to help prevent falls. This information is not intended to replace advice given to you by your health care provider. Make sure you discuss any questions you have with your health care provider. Document Released: 10/15/2008 Document Revised: 05/27/2015 Document Reviewed: 01/23/2014 Elsevier Interactive Patient Education  2017 [employer].

## 2020-01-29 NOTE — Telephone Encounter (Signed)
Results called to patient's husband, ok per DPR. He verbalized understanding of the results and plan of care as stated in the result note. Med list updated.

## 2020-01-29 NOTE — Telephone Encounter (Signed)
-----   Message from Jillian Bush, MD sent at 01/29/2020  9:33 AM EST ----- Please let [name] know that her creatinine is relatively stable compared with the time of hospital discharge a ~6 weeks ago.  It has not returned to her baseline.  I think we should defer restarting lisinopril at this time and instead increase amlodipine to 5 mg daily.  [name] should take furosemide 40 mg on M/W/F, as discussed yesterday.  She should follow-up closely with Dr. Caryn Section and discuss the need for nephrology consultation with him.

## 2020-01-30 NOTE — Telephone Encounter (Signed)
Is this just for a standard manual wheelchair?

## 2020-02-02 NOTE — Telephone Encounter (Signed)
Yes

## 2020-02-03 ENCOUNTER — Other Ambulatory Visit: Payer: Self-pay

## 2020-02-03 ENCOUNTER — Encounter: Payer: Medicare Other | Attending: Physician Assistant | Admitting: Physician Assistant

## 2020-02-03 DIAGNOSIS — I428 Other cardiomyopathies: Secondary | ICD-10-CM | POA: Diagnosis not present

## 2020-02-03 DIAGNOSIS — L03116 Cellulitis of left lower limb: Secondary | ICD-10-CM | POA: Insufficient documentation

## 2020-02-03 DIAGNOSIS — I872 Venous insufficiency (chronic) (peripheral): Secondary | ICD-10-CM | POA: Insufficient documentation

## 2020-02-03 DIAGNOSIS — L03115 Cellulitis of right lower limb: Secondary | ICD-10-CM | POA: Insufficient documentation

## 2020-02-03 DIAGNOSIS — L97829 Non-pressure chronic ulcer of other part of left lower leg with unspecified severity: Secondary | ICD-10-CM | POA: Diagnosis not present

## 2020-02-03 DIAGNOSIS — I272 Pulmonary hypertension, unspecified: Secondary | ICD-10-CM | POA: Insufficient documentation

## 2020-02-03 DIAGNOSIS — I2510 Atherosclerotic heart disease of native coronary artery without angina pectoris: Secondary | ICD-10-CM | POA: Insufficient documentation

## 2020-02-03 DIAGNOSIS — E119 Type 2 diabetes mellitus without complications: Secondary | ICD-10-CM | POA: Diagnosis not present

## 2020-02-03 DIAGNOSIS — I10 Essential (primary) hypertension: Secondary | ICD-10-CM | POA: Diagnosis not present

## 2020-02-03 DIAGNOSIS — I890 Lymphedema, not elsewhere classified: Secondary | ICD-10-CM | POA: Insufficient documentation

## 2020-02-03 NOTE — Progress Notes (Addendum)
LEOMIA, BLAKE ([id number]) Visit Report for 02/03/2020 Chief Complaint Document Details Patient Name: [name], [name]. Date of Service: 02/03/2020 12:45 PM Medical Record Number: [id number] Patient Account Number: [id number] Date of Birth/Sex: 04-14-1945 (75 y.o. F) Treating RN: Dolan Amen Primary Care Provider: Lelon Huh Other Clinician: Referring Provider: Lelon Huh Treating Provider/Extender: Skipper Cliche in Treatment: 7 Information Obtained from: Patient Chief Complaint Patient seen for complaints of Non-Healing Wounds to both lower extremities Electronic Signature(s) Signed: 02/03/2020 1:04:35 PM By: Worthy Keeler PA-C Entered By: Worthy Keeler on 02/03/2020 13:04:35 [name] ([id number]) -------------------------------------------------------------------------------- HPI Details Patient Name: [name] Date of Service: 02/03/2020 12:45 PM Medical Record Number: [id number] Patient Account Number: [id number] Date of Birth/Sex: [birth year] (75 y.o. F) Treating RN: Dolan Amen Primary Care Provider: Lelon Huh Other Clinician: Referring Provider: Lelon Huh Treating Provider/Extender: Skipper Cliche in Treatment: 7 History of Present Illness Location: both lower extremity swelling with ulceration HPI Description: 75 year old patient who sees her PCP Dr. Lelon Huh was recently evaluated 10 days ago for diabetes mellitus, hypertension, CHF and hyperlipidemia. she also was noted to have ulcerations develop in her legs and she has been applying Silvadene dressings locally. In the past she has refused wound care referrals.her cardiologist Dr. Saunders Revel saw her and put her on 40 mg of furosemide daily. last hemoglobin A1c was 7.7%. she was also placed on ciprofloxacin twice daily for 7 days and a urine culture was recommended. past medical history significant for coronary artery disease, diabetes mellitus, nonischemic cardiomyopathy and pulmonary  hypertension. She is also status post heart catheterization and coronary angiography, tubal ligation and breast cyst removal in the past. She has never been a smoker. Patient had arterial studies done which showed bilateral ABIs are artificially elevated due to noncompressible and calcified vessels. Triphasic waveform throughout. Right great toe TBI is elevated while the left is normal. 10/23/2016 -- the patient is rather moribund from several issues including chronic back pain and knee pain and swelling of her legs. The large necrotic area on her left lateral anterior calf was bleeding on touch after washing her leg. There was a spot which needed silver nitrate cauterization and this was done appropriately. 11/06/2016 -- she is again noted to have friable bleeding from the left lower extremity wounds and this again had to be cauterized with silver nitrate to control the bleeding as pressure itself would not do it. 11/20/2016 -- the right lower extremity is completely healed and we have ordered 30-40 mm compression stocking's in both the dural layer and also a pair of juxta lites. 12/18/2016 -- she has not been wearing her compression stockings on her right leg and these have opened out into ulcerations again. Her left lower extremity has circumferential ulcerations now. I believe at this stage I would like to get her venous reflux studies done to make certain that there is no fixable superficial venous reflux. 12/29/2016 -- she has made an excellent recovery having continued with her appropriate doses of diuretics and elevation and exercise. She has not yet received her right lower extremity juxta lites. Her venous duplex study is at the end of January 01/13/16 on evaluation today patient's wounds appeared to be overall doing much better much less hyper granular than previous week's evaluation. The Hydrofera Blue Dressing's to be doing very well. She does tell me that she is having a little bit more  discomfort in the posterior aspect of her leg where she has a wound at this  point. Fortunately there appears to be no infection. 01/19/17 on evaluation today patient appears to be doing very well in regard to her bilateral lower extremity swelling and at this point in time her left lower extremity ulcers. Her wounds appear to be doing much better. She has been tolerating the dressing's at this point fortunately she did get the Juxta-Lite compression as of today as well. Nonetheless I am pleased that she has been tolerating everything so well and that her wounds looks so good. In fact she is an excellent granular surface no evidence of slough covering and I do not see any reason for likely debridement today. Especially in regard to the posterior leg. 01/26/17 on evaluation today patient appears to be doing decently well in regard to her wounds. The surface of the majority of her wounds is greatly improved. She has been using the Juxta-Lite compression which is excellent. Nonetheless the wound of the left posterior calf did require some debridement today otherwise the majority of the wounds did not require any debridement. The this was due to slough buildup on the surface. She also has a small skin flap where two of the ulcers actually connected and this has loosened up I'm concerned that things may not heal well with that flap but for the time being I'm not gonna do anything different in that regard. 02/23/17 on evaluation today patient's wound actually appears to be doing fairly well in regard to the remaining left lateral ulcer which we have been treated. With that being said it does appear that her wrap actually slid down the wood bit over the past few days at least that there's actually some friction injury and blistering noted on the medial aspect of her malleolus of the left lower extremity. She continues to use the Juxta- Lite for the right lower extremity. With that being said no debridement is  necessary today and these are very superficial injuries as far as the new injuries are concerned. 03/02/17 on evaluation today patient appears to be doing excellent in regard to her left lateral lower from the ulcers. In fact it appears that she is almost completely healed in regard to the ulcer we have been following and the new injuries which were caused by the wraps slipping down last week have been completely resolved which is excellent news. Overall I'm definitely pleased with the progress that she has made. Fortunately the rat did not cause any irritation as it did previously with the Unna wrap. 03/09/17 on evaluation today patient's wound appears to be completely healed. Obviously this is great news. She and her husband both are extremely pleased and excited to finally have this gone. Obviously she has been dealing with this wound for quite some time. Readmission: 04/14/2019 upon evaluation today patient appears for reevaluation here in our clinic although it has been since March 2019 since I last saw her. She unfortunately is having issues similar to what she had previous which are blisters and ulcerations of the bilateral lower extremities. Fortunately there is no evidence of active infection at this time based on what I am seeing. Unfortunately she is still having a lot of drainage and weeping. I do believe she needs to be compression wrap in order to get this under control and she is previously tolerated a 3 layer compression wrap without any complications whatsoever. No fevers, chills, nausea, vomiting, or diarrhea. DAY, GREB ([id number]) 04/28/2019 upon evaluation today patient appears to be doing well with regard to the bilateral lower  extremities at this time. She has been tolerating the dressing changes without complication. Fortunately there is no signs of active infection at this time. No fevers, chills, nausea, vomiting, or diarrhea. 05/05/2019 upon evaluation today patient appears  to be doing excellent in regard to her legs. She has a lot of new skin growth and overall seems to be progressing quite nicely. Fortunately there is no signs of active infection at this time. No fevers, chills, nausea, vomiting, or diarrhea. 05/12/2019 upon evaluation today patient appears to be doing very well with regard to her lower extremities. She is not completely healed at this point but nonetheless he is doing much better even compared to last week. I am very pleased with where things stand. No fevers, chills, nausea, vomiting, or diarrhea. 05/26/2019 upon evaluation today patient actually appears to be doing worse with regard to her lower extremities. She has green drainage unfortunately is having more pain on the right lower extremity as well. Fortunately there is no signs of active infection systemically which is good news. 06/03/2019 upon evaluation today patient appears to be doing about the same in regard to the fact that she still has wounds on both lower extremities although all wounds are measuring better and appearing much better. She finally did start taking the medication which has made a big difference for her that is the Levaquin. Unfortunately this appears to be making her urinate much more frequently. She has been taking this in the evening therefore it is keeping her up at night. They are wondering if they can switch to taking this during the day. I would actually never instructed that they had to take this at night and I think it is definitely fine for her to take it in the morning. She will start that tomorrow. 06/10/2019 upon evaluation today patient appears to be doing excellent in regard to her lower extremities currently. The right lower extremity actually appears to be pretty much healed based on what I am seeing I do not see anything open or draining. The left lower extremity though not completely healed does appear to be doing much better which is great news. Overall there  is no signs of active infection at this time. 06/17/2019 upon evaluation today patient appears to be doing excellent in regard to her ulcers. She has been tolerating the dressing changes without complication in fact everything appears to be almost completely healed today. There is one area on the left lower extremity which appears to potentially still be weeping slightly and I think that we do need to see about wrapping her 1 more week just to make sure everything tightens up before discharge. 06/24/2019 upon evaluation today patient appears to be doing excellent in regard to her bilateral lower extremities. There does not appear to be any evidence of active infection which is great news and overall I am extremely pleased with the fact that again she appears to be ready for discharge. Readmission: 12/11/2019 patient is here for readmission today concerning her bilateral lower extremities. We have not seen her since June 2021. With that being said for the past 2 months apparently she has been having issues with her legs. It was noted by her husband that unfortunately he attempted to call to get an appointment with [place] but was not able to get anything in a sufficient amount of time to get her in sooner due to the fact that again we were having issues with staffing and scheduling. Therefore it was delayed getting him  into the wound care center for his wife. Nonetheless she appears to have evidence of cellulitis today of the bilateral lower extremities. Unfortunately she does not seem to be doing too well she is having a tremendous amount of pain and is asking for pain medication. Obviously it does look like her legs hurt although her wounds appear to be very superficial I am more concerned about the cellulitis aspect of this as opposed to the actual wound openings. 01/06/2020 upon evaluation today patient's legs actually appear to be doing significantly better as compared to last time I saw her when we  were seeing her back for the first evaluation and actually had to send her to the ER for further evaluation. She was admitted to the hospital and was actually there from 12/12/2019 through 12/15/2019. With that being said the patient was placed on IV antibiotics initially, Ancef and IV fluids. Subsequently upon discharge she was given Keflex. With that being said she is getting need an extension of the Keflex as she was only given 3 days in 1 to make sure this completely resolves and does not cause her any additional issues or concerns here. I will send that in for them today. 01/13/2020 upon evaluation today patient appears to be doing decently well in regard to her legs. The areas where she had wounds previously have actually improved which is great news. There does not appear to be any signs of active infection at this time which is great news. With IV NSAIDs she did have some itching on the top of her foot that prompted them to remove the dressing/compression wrap last week because she was just absolutely miserable over the weekend. Nonetheless I do think that we can do some things to try to help out here there may be a little bit of a fungal and for. We are going to treat that accordingly. 01/20/2020 upon evaluation today patient actually appears to be doing well with regard to her legs. In general I am very pleased with how things seem to be progressing. I do not see any signs of active infection at this time which is great news. Overall I think the patient is pleased as well she is not really having any pain and she tells me that itching is greatly improved 01/27/2020 upon evaluation today patient appears to be doing excellent in regard to her leg ulcers that were previously present. They have healed and seem to be doing great I see no signs of infection at this time. No fevers, chills, nausea, vomiting, or diarrhea. With that being said I do believe that the patient is making good progress but  still seems to be very fluid overloaded. We discussed the possibility that she may need to be back on the Lasix she was taken off this at the hospital but nonetheless I think she needs to have something on board to help with her edema. Otherwise I think she is going to continue to have significant issues with new blisters forming as we see this week 02/03/2020 upon evaluation today patient appears to be doing decently well in regard to her legs. Fortunately there is no signs of active infection at this time which is great news. She does have a very slight issue going on with a couple blisters that actually have not ruptured and seem to be drying up that is good news overall I do not see any signs of active infection at this time also good news. Electronic Signature(s) Signed: 02/03/2020 1:38:24 PM  By: Worthy Keeler PA-C Entered By: Worthy Keeler on 02/03/2020 13:38:24 [name] ([id number]) -------------------------------------------------------------------------------- Physical Exam Details Patient Name: [name], Jillian B. Date of Service: 02/03/2020 12:45 PM Medical Record Number: [id number] Patient Account Number: [id number] Date of Birth/Sex: 02-Sep-1945 (75 y.o. F) Treating RN: Dolan Amen Primary Care Provider: Lelon Huh Other Clinician: Referring Provider: Lelon Huh Treating Provider/Extender: Skipper Cliche in Treatment: 7 Constitutional Well-nourished and well-hydrated in no acute distress. Respiratory normal breathing without difficulty. Psychiatric this patient is able to make decisions and demonstrates good insight into disease process. Alert and Oriented x 3. pleasant and cooperative. Notes Upon inspection patient's wound bed actually showed signs of good granulation at this time. There does not appear to be any evidence of active infection which is great news and overall very pleased with where things stand today. No fevers, chills, nausea, vomiting, or  diarrhea. Electronic Signature(s) Signed: 02/03/2020 1:38:38 PM By: Worthy Keeler PA-C Entered By: Worthy Keeler on 02/03/2020 13:38:38 [name] ([id number]) -------------------------------------------------------------------------------- Physician Orders Details Patient Name: [name], Jillian B. Date of Service: 02/03/2020 12:45 PM Medical Record Number: [id number] Patient Account Number: [id number] Date of Birth/Sex: [birth year] (75 y.o. F) Treating RN: Dolan Amen Primary Care Provider: Lelon Huh Other Clinician: Referring Provider: Lelon Huh Treating Provider/Extender: Skipper Cliche in Treatment: 7 Verbal / Phone Orders: No Diagnosis Coding ICD-10 Coding Code Description I89.0 Lymphedema, not elsewhere classified I87.2 Venous insufficiency (chronic) (peripheral) L03.115 Cellulitis of right lower limb L03.116 Cellulitis of left lower limb Follow-up Appointments o Return Appointment in 1 week. o Nurse Visit as needed Bathing/ Shower/ Hygiene o May shower with wound dressing protected with water repellent cover or cast protector. Edema Control - Lymphedema / Segmental Compressive Device / Other Bilateral Lower Extremities o Optional: One layer of unna paste to top of compression wrap (to act as an anchor). o 3 Layer Compression System for Lymphedema. o Elevate legs to the level of the heart and pump ankles as often as possible o Elevate leg(s) parallel to the floor when sitting. Wound Treatment Wound #24 - Lower Leg Wound Laterality: Left, Lateral, Proximal Cleanser: Soap and Water 1 x Per Week/30 Days Discharge Instructions: Gently cleanse wound with antibacterial soap, rinse and pat dry prior to dressing wounds Peri-Wound Care: Triamcinolone Acetonide Cream, 0.1%, 15 (g) tube (Generic) 1 x Per Week/30 Days Secondary Dressing: ABD Pad 5x9 (in/in) 1 x Per Week/30 Days Discharge Instructions: Cover with ABD pad Compression Wrap: Profore Lite LF  3 Multilayer Compression Bandaging System (Generic) 1 x Per Week/30 Days Discharge Instructions: Apply 3 multi-layer wrap as prescribed. Compression Wrap: Unna w/Calamine, 4x10 (in/yd) (Generic) 1 x Per Week/30 Days Discharge Instructions: Apply unna paste -3 finger-widths below knee to anchor compression wrap in place. Electronic Signature(s) Signed: 02/03/2020 9:50:44 PM By: Worthy Keeler PA-C Signed: 02/04/2020 9:59:20 AM By: Carlene Coria RN Entered By: Carlene Coria on 02/03/2020 14:08:39 [name] ([id number]) -------------------------------------------------------------------------------- Problem List Details Patient Name: [name], Jillian B. Date of Service: 02/03/2020 12:45 PM Medical Record Number: [id number] Patient Account Number: [id number] Date of Birth/Sex: Apr 28, 1945 (75 y.o. F) Treating RN: Dolan Amen Primary Care Provider: Lelon Huh Other Clinician: Referring Provider: Lelon Huh Treating Provider/Extender: Skipper Cliche in Treatment: 7 Active Problems ICD-10 Encounter Code Description Active Date MDM Diagnosis I89.0 Lymphedema, not elsewhere classified 12/11/2019 No Yes I87.2 Venous insufficiency (chronic) (peripheral) 12/11/2019 No Yes L03.115 Cellulitis of right lower limb 12/11/2019 No Yes L03.116 Cellulitis of left lower limb 12/11/2019  No Yes Inactive Problems Resolved Problems Electronic Signature(s) Signed: 02/03/2020 1:04:28 PM By: Worthy Keeler PA-C Entered By: Worthy Keeler on 02/03/2020 13:04:28 [name] ([id number]) -------------------------------------------------------------------------------- Progress Note Details Patient Name: [name], Jillian B. Date of Service: 02/03/2020 12:45 PM Medical Record Number: [id number] Patient Account Number: [id number] Date of Birth/Sex: [birth year] (75 y.o. F) Treating RN: Dolan Amen Primary Care Provider: Lelon Huh Other Clinician: Referring Provider: Lelon Huh Treating  Provider/Extender: Skipper Cliche in Treatment: 7 Subjective Chief Complaint Information obtained from Patient Patient seen for complaints of Non-Healing Wounds to both lower extremities History of Present Illness (HPI) The following HPI elements were documented for the patient's wound: Location: both lower extremity swelling with ulceration 75 year old patient who sees her PCP Dr. Lelon Huh was recently evaluated 10 days ago for diabetes mellitus, hypertension, CHF and hyperlipidemia. she also was noted to have ulcerations develop in her legs and she has been applying Silvadene dressings locally. In the past she has refused wound care referrals.her cardiologist Dr. Saunders Revel saw her and put her on 40 mg of furosemide daily. last hemoglobin A1c was 7.7%. she was also placed on ciprofloxacin twice daily for 7 days and a urine culture was recommended. past medical history significant for coronary artery disease, diabetes mellitus, nonischemic cardiomyopathy and pulmonary hypertension. She is also status post heart catheterization and coronary angiography, tubal ligation and breast cyst removal in the past. She has never been a smoker. Patient had arterial studies done which showed bilateral ABIs are artificially elevated due to noncompressible and calcified vessels. Triphasic waveform throughout. Right great toe TBI is elevated while the left is normal. 10/23/2016 -- the patient is rather moribund from several issues including chronic back pain and knee pain and swelling of her legs. The large necrotic area on her left lateral anterior calf was bleeding on touch after washing her leg. There was a spot which needed silver nitrate cauterization and this was done appropriately. 11/06/2016 -- she is again noted to have friable bleeding from the left lower extremity wounds and this again had to be cauterized with silver nitrate to control the bleeding as pressure itself would not do it. 11/20/2016 --  the right lower extremity is completely healed and we have ordered 30-40 mm compression stocking's in both the dural layer and also a pair of juxta lites. 12/18/2016 -- she has not been wearing her compression stockings on her right leg and these have opened out into ulcerations again. Her left lower extremity has circumferential ulcerations now. I believe at this stage I would like to get her venous reflux studies done to make certain that there is no fixable superficial venous reflux. 12/29/2016 -- she has made an excellent recovery having continued with her appropriate doses of diuretics and elevation and exercise. She has not yet received her right lower extremity juxta lites. Her venous duplex study is at the end of January 01/13/16 on evaluation today patient's wounds appeared to be overall doing much better much less hyper granular than previous week's evaluation. The Hydrofera Blue Dressing's to be doing very well. She does tell me that she is having a little bit more discomfort in the posterior aspect of her leg where she has a wound at this point. Fortunately there appears to be no infection. 01/19/17 on evaluation today patient appears to be doing very well in regard to her bilateral lower extremity swelling and at this point in time her left lower extremity ulcers. Her wounds appear to be doing  much better. She has been tolerating the dressing's at this point fortunately she did get the Juxta-Lite compression as of today as well. Nonetheless I am pleased that she has been tolerating everything so well and that her wounds looks so good. In fact she is an excellent granular surface no evidence of slough covering and I do not see any reason for likely debridement today. Especially in regard to the posterior leg. 01/26/17 on evaluation today patient appears to be doing decently well in regard to her wounds. The surface of the majority of her wounds is greatly improved. She has been using the  Juxta-Lite compression which is excellent. Nonetheless the wound of the left posterior calf did require some debridement today otherwise the majority of the wounds did not require any debridement. The this was due to slough buildup on the surface. She also has a small skin flap where two of the ulcers actually connected and this has loosened up I'm concerned that things may not heal well with that flap but for the time being I'm not gonna do anything different in that regard. 02/23/17 on evaluation today patient's wound actually appears to be doing fairly well in regard to the remaining left lateral ulcer which we have been treated. With that being said it does appear that her wrap actually slid down the wood bit over the past few days at least that there's actually some friction injury and blistering noted on the medial aspect of her malleolus of the left lower extremity. She continues to use the Juxta- Lite for the right lower extremity. With that being said no debridement is necessary today and these are very superficial injuries as far as the new injuries are concerned. 03/02/17 on evaluation today patient appears to be doing excellent in regard to her left lateral lower from the ulcers. In fact it appears that she is almost completely healed in regard to the ulcer we have been following and the new injuries which were caused by the wraps slipping down last week have been completely resolved which is excellent news. Overall I'm definitely pleased with the progress that she has made. Fortunately the rat did not cause any irritation as it did previously with the Unna wrap. 03/09/17 on evaluation today patient's wound appears to be completely healed. Obviously this is great news. She and her husband both are extremely pleased and excited to finally have this gone. Obviously she has been dealing with this wound for quite some time. Readmission: [name], [name] ([id number]) 04/14/2019 upon evaluation today  patient appears for reevaluation here in our clinic although it has been since March 2019 since I last saw her. She unfortunately is having issues similar to what she had previous which are blisters and ulcerations of the bilateral lower extremities. Fortunately there is no evidence of active infection at this time based on what I am seeing. Unfortunately she is still having a lot of drainage and weeping. I do believe she needs to be compression wrap in order to get this under control and she is previously tolerated a 3 layer compression wrap without any complications whatsoever. No fevers, chills, nausea, vomiting, or diarrhea. 04/28/2019 upon evaluation today patient appears to be doing well with regard to the bilateral lower extremities at this time. She has been tolerating the dressing changes without complication. Fortunately there is no signs of active infection at this time. No fevers, chills, nausea, vomiting, or diarrhea. 05/05/2019 upon evaluation today patient appears to be doing excellent in regard  to her legs. She has a lot of new skin growth and overall seems to be progressing quite nicely. Fortunately there is no signs of active infection at this time. No fevers, chills, nausea, vomiting, or diarrhea. 05/12/2019 upon evaluation today patient appears to be doing very well with regard to her lower extremities. She is not completely healed at this point but nonetheless he is doing much better even compared to last week. I am very pleased with where things stand. No fevers, chills, nausea, vomiting, or diarrhea. 05/26/2019 upon evaluation today patient actually appears to be doing worse with regard to her lower extremities. She has green drainage unfortunately is having more pain on the right lower extremity as well. Fortunately there is no signs of active infection systemically which is good news. 06/03/2019 upon evaluation today patient appears to be doing about the same in regard to the fact  that she still has wounds on both lower extremities although all wounds are measuring better and appearing much better. She finally did start taking the medication which has made a big difference for her that is the Levaquin. Unfortunately this appears to be making her urinate much more frequently. She has been taking this in the evening therefore it is keeping her up at night. They are wondering if they can switch to taking this during the day. I would actually never instructed that they had to take this at night and I think it is definitely fine for her to take it in the morning. She will start that tomorrow. 06/10/2019 upon evaluation today patient appears to be doing excellent in regard to her lower extremities currently. The right lower extremity actually appears to be pretty much healed based on what I am seeing I do not see anything open or draining. The left lower extremity though not completely healed does appear to be doing much better which is great news. Overall there is no signs of active infection at this time. 06/17/2019 upon evaluation today patient appears to be doing excellent in regard to her ulcers. She has been tolerating the dressing changes without complication in fact everything appears to be almost completely healed today. There is one area on the left lower extremity which appears to potentially still be weeping slightly and I think that we do need to see about wrapping her 1 more week just to make sure everything tightens up before discharge. 06/24/2019 upon evaluation today patient appears to be doing excellent in regard to her bilateral lower extremities. There does not appear to be any evidence of active infection which is great news and overall I am extremely pleased with the fact that again she appears to be ready for discharge. Readmission: 12/11/2019 patient is here for readmission today concerning her bilateral lower extremities. We have not seen her since June 2021.  With that being said for the past 2 months apparently she has been having issues with her legs. It was noted by her husband that unfortunately he attempted to call to get an appointment with [place] but was not able to get anything in a sufficient amount of time to get her in sooner due to the fact that again we were having issues with staffing and scheduling. Therefore it was delayed getting him into the wound care center for his wife. Nonetheless she appears to have evidence of cellulitis today of the bilateral lower extremities. Unfortunately she does not seem to be doing too well she is having a tremendous amount of pain and is asking  for pain medication. Obviously it does look like her legs hurt although her wounds appear to be very superficial I am more concerned about the cellulitis aspect of this as opposed to the actual wound openings. 01/06/2020 upon evaluation today patient's legs actually appear to be doing significantly better as compared to last time I saw her when we were seeing her back for the first evaluation and actually had to send her to the ER for further evaluation. She was admitted to the hospital and was actually there from 12/12/2019 through 12/15/2019. With that being said the patient was placed on IV antibiotics initially, Ancef and IV fluids. Subsequently upon discharge she was given Keflex. With that being said she is getting need an extension of the Keflex as she was only given 3 days in 1 to make sure this completely resolves and does not cause her any additional issues or concerns here. I will send that in for them today. 01/13/2020 upon evaluation today patient appears to be doing decently well in regard to her legs. The areas where she had wounds previously have actually improved which is great news. There does not appear to be any signs of active infection at this time which is great news. With IV NSAIDs she did have some itching on the top of her foot that prompted them to  remove the dressing/compression wrap last week because she was just absolutely miserable over the weekend. Nonetheless I do think that we can do some things to try to help out here there may be a little bit of a fungal and for. We are going to treat that accordingly. 01/20/2020 upon evaluation today patient actually appears to be doing well with regard to her legs. In general I am very pleased with how things seem to be progressing. I do not see any signs of active infection at this time which is great news. Overall I think the patient is pleased as well she is not really having any pain and she tells me that itching is greatly improved 01/27/2020 upon evaluation today patient appears to be doing excellent in regard to her leg ulcers that were previously present. They have healed and seem to be doing great I see no signs of infection at this time. No fevers, chills, nausea, vomiting, or diarrhea. With that being said I do believe that the patient is making good progress but still seems to be very fluid overloaded. We discussed the possibility that she may need to be back on the Lasix she was taken off this at the hospital but nonetheless I think she needs to have something on board to help with her edema. Otherwise I think she is going to continue to have significant issues with new blisters forming as we see this week 02/03/2020 upon evaluation today patient appears to be doing decently well in regard to her legs. Fortunately there is no signs of active infection at this time which is great news. She does have a very slight issue going on with a couple blisters that actually have not ruptured and seem to be drying up that is good news overall I do not see any signs of active infection at this time also good news. [name], [name] ([id number]) Objective Constitutional Well-nourished and well-hydrated in no acute distress. Vitals Time Taken: 12:45 PM, Height: 64 in, Weight: 205 lbs, BMI: 35.2,  Temperature: 98.2 F, Pulse: 69 bpm, Respiratory Rate: 18 breaths/min, Blood Pressure: 186/93 mmHg. Respiratory normal breathing without difficulty. Psychiatric this patient is  able to make decisions and demonstrates good insight into disease process. Alert and Oriented x 3. pleasant and cooperative. General Notes: Upon inspection patient's wound bed actually showed signs of good granulation at this time. There does not appear to be any evidence of active infection which is great news and overall very pleased with where things stand today. No fevers, chills, nausea, vomiting, or diarrhea. Integumentary (Hair, Skin) Wound #24 status is Open. Original cause of wound was Gradually Appeared. The wound is located on the Left,Proximal,Lateral Lower Leg. The wound measures 1.4cm length x 0.4cm width x 0.1cm depth; 0.44cm^2 area and 0.044cm^3 volume. Wound #25 status is Healed - Epithelialized. Original cause of wound was Gradually Appeared. The wound is located on the Left,Lateral Ankle. The wound measures 0cm length x 0cm width x 0cm depth; 0cm^2 area and 0cm^3 volume. Assessment Active Problems ICD-10 Lymphedema, not elsewhere classified Venous insufficiency (chronic) (peripheral) Cellulitis of right lower limb Cellulitis of left lower limb Procedures Wound #24 Pre-procedure diagnosis of Wound #24 is a Lymphedema located on the Left,Proximal,Lateral Lower Leg . There was a Three Layer Compression Therapy Procedure by Dolan Amen, RN. Post procedure Diagnosis Wound #24: Same as Pre-Procedure Notes: pt tolerating wrap well. There was a Three Layer Compression Therapy Procedure by Dolan Amen, RN. Post procedure Diagnosis Wound #: Same as Pre-Procedure Notes: pt tolerating wrap well. Plan Follow-up Appointments: Return Appointment in 1 week. Nurse Visit as needed Bathing/ Shower/ Hygiene: May shower with wound dressing protected with water repellent cover or cast protector. Edema  Control - Lymphedema / Segmental Compressive Device / Other: Optional: One layer of unna paste to top of compression wrap (to act as an anchor). 3 Layer Compression System for Lymphedema. [name], [name] ([id number]) Elevate legs to the level of the heart and pump ankles as often as possible Elevate leg(s) parallel to the floor when sitting. WOUND #24: - Lower Leg Wound Laterality: Left, Lateral, Proximal Cleanser: Soap and Water 1 x Per Week/30 Days Discharge Instructions: Gently cleanse wound with antibacterial soap, rinse and pat dry prior to dressing wounds Peri-Wound Care: Triamcinolone Acetonide Cream, 0.1%, 15 (g) tube (Generic) 1 x Per Week/30 Days Secondary Dressing: ABD Pad 5x9 (in/in) 1 x Per Week/30 Days Discharge Instructions: Cover with ABD pad Compression Wrap: Profore Lite LF 3 Multilayer Compression Bandaging System (Generic) 1 x Per Week/30 Days Discharge Instructions: Apply 3 multi-layer wrap as prescribed. Compression Wrap: Unna w/Calamine, 4x10 (in/yd) (Generic) 1 x Per Week/30 Days Discharge Instructions: Apply unna paste -3 finger-widths below knee to anchor compression wrap in place. 1. Would recommend currently that we actually continue with the ABD pads just to cover any areas of other blisters in case this does leak. 2. We will continue with 3 layer compression wrap for the bilateral lower extremities this week and hopefully she will begin to her own compression next week. We will see patient back for reevaluation in 1 week here in the clinic. If anything worsens or changes patient will contact our office for additional recommendations. Electronic Signature(s) Signed: 02/03/2020 1:39:01 PM By: Worthy Keeler PA-C Entered By: Worthy Keeler on 02/03/2020 13:39:01 [name] ([id number]) -------------------------------------------------------------------------------- SuperBill Details Patient Name: [name] Date of Service: 02/03/2020 Medical Record  Number: [id number] Patient Account Number: [id number] Date of Birth/Sex: 05/04/1945 (75 y.o. F) Treating RN: Dolan Amen Primary Care Provider: Lelon Huh Other Clinician: Referring Provider: Lelon Huh Treating Provider/Extender: Skipper Cliche in Treatment: 7 Diagnosis Coding ICD-10 Codes Code Description I89.0  Lymphedema, not elsewhere classified I87.2 Venous insufficiency (chronic) (peripheral) L03.115 Cellulitis of right lower limb L03.116 Cellulitis of left lower limb Facility Procedures CPT4: Description Modifier Quantity Code [id number] BILATERAL: Application of multi-layer venous compression system; leg (below knee), including 1 ankle and foot. Physician Procedures CPT4 Code: [id number] Description: 12244 - WC PHYS LEVEL 3 - EST PT Modifier: Quantity: 1 CPT4 Code: Description: ICD-10 Diagnosis Description I89.0 Lymphedema, not elsewhere classified I87.2 Venous insufficiency (chronic) (peripheral) L03.115 Cellulitis of right lower limb L03.116 Cellulitis of left lower limb Modifier: Quantity: Electronic Signature(s) Signed: 02/03/2020 1:39:13 PM By: Worthy Keeler PA-C Entered By: Worthy Keeler on 02/03/2020 13:39:12

## 2020-02-04 NOTE — Progress Notes (Signed)
AS[place], [place] ([id number]) Visit Report for 02/03/2020 Arrival Information Details Patient Name: [name], [name]. Date of Service: 02/03/2020 12:45 PM Medical Record Number: [id number] Patient Account Number: [id number] Date of Birth/Sex: 12/13/1945 (75 y.o. F) Treating RN: Dolan Amen Primary Care Kenyata Guess: Lelon Huh Other Clinician: Referring [place] Sorn: Lelon Huh Treating Marice Guidone/Extender: Skipper Cliche in Treatment: 7 Visit Information History Since Last Visit Added or deleted any medications: No Patient Arrived: Wheel Chair Any new allergies or adverse reactions: No Arrival Time: 12:46 Had a fall or experienced change in No Accompanied By: husband activities of daily living that may affect Transfer Assistance: Manual risk of falls: Patient Identification Verified: Yes Signs or symptoms of abuse/neglect since last visito No Secondary Verification Process Completed: Yes Hospitalized since last visit: No Patient Requires Transmission-Based Precautions: No Implantable device outside of the clinic excluding No Patient Has Alerts: No cellular tissue based products placed in the center since last visit: Has Dressing in Place as Prescribed: Yes Has Compression in Place as Prescribed: Yes Pain Present Now: No Electronic Signature(s) Signed: 02/03/2020 4:20:22 PM By: Lorine Bears RCP, RRT, CHT Entered By: Lorine Bears on 02/03/2020 12:47:31 [name] ([id number]) -------------------------------------------------------------------------------- Compression Therapy Details Patient Name: Jillian Albe B. Date of Service: 02/03/2020 12:45 PM Medical Record Number: [id number] Patient Account Number: [id number] Date of Birth/Sex: March 26, 1945 (75 y.o. F) Treating RN: Dolan Amen Primary Care Ewan Grau: Lelon Huh Other Clinician: Referring Leyli Kevorkian: Lelon Huh Treating Brogan England/Extender: Skipper Cliche in Treatment: 7 Compression  Therapy Performed for Wound Assessment: Wound #24 Left,Proximal,Lateral Lower Leg Performed By: Clinician Dolan Amen, RN Compression Type: Three Layer Post Procedure Diagnosis Same as Pre-procedure Notes pt tolerating wrap well Electronic Signature(s) Signed: 02/03/2020 4:36:53 PM By: Georges Mouse, Minus Breeding RN Entered By: Georges Mouse, Minus Breeding on 02/03/2020 13:08:54 [name] ([id number]) -------------------------------------------------------------------------------- Compression Therapy Details Patient Name: [name], Jillian B. Date of Service: 02/03/2020 12:45 PM Medical Record Number: [id number] Patient Account Number: [id number] Date of Birth/Sex: 1945-07-21 (75 y.o. F) Treating RN: Dolan Amen Primary Care Francella Barnett: Lelon Huh Other Clinician: Referring Nolton Denis: Lelon Huh Treating Arsalan Brisbin/Extender: Skipper Cliche in Treatment: 7 Compression Therapy Performed for Wound Assessment: Non-Wound Location Performed By: Clinician Dolan Amen, RN Compression Type: Three Layer Location: Lower Extremity, Right Post Procedure Diagnosis Same as Pre-procedure Notes pt tolerating wrap well Electronic Signature(s) Signed: 02/03/2020 4:36:53 PM By: Georges Mouse, Minus Breeding RN Entered By: Georges Mouse, Minus Breeding on 02/03/2020 13:09:41 [name] ([id number]) -------------------------------------------------------------------------------- Encounter Discharge Information Details Patient Name: [name], Jillian B. Date of Service: 02/03/2020 12:45 PM Medical Record Number: [id number] Patient Account Number: [id number] Date of Birth/Sex: [birth year] (75 y.o. F) Treating RN: Carlene Coria Primary Care Rylie Knierim: Lelon Huh Other Clinician: Referring Raegyn Renda: Lelon Huh Treating Marlon Suleiman/Extender: Skipper Cliche in Treatment: 7 Encounter Discharge Information Items Discharge Condition: Stable Ambulatory Status: Wheelchair Discharge Destination: Home Transportation:  Private Auto Accompanied By: self Schedule Follow-up Appointment: Yes Clinical Summary of Care: Patient Declined Electronic Signature(s) Signed: 02/04/2020 9:59:20 AM By: Carlene Coria RN Entered By: Carlene Coria on 02/03/2020 13:43:11 [name] ([id number]) -------------------------------------------------------------------------------- Lower Extremity Assessment Details Patient Name: [name], Jillian B. Date of Service: 02/03/2020 12:45 PM Medical Record Number: [id number] Patient Account Number: [id number] Date of Birth/Sex: Aug 27, 1945 (75 y.o. F) Treating RN: Cornell Barman Primary Care Suraiya Dickerson: Lelon Huh Other Clinician: Referring Reeanna Acri: Lelon Huh Treating Kalisi Bevill/Extender: Skipper Cliche in Treatment: 7 Edema Assessment Assessed: Shirlyn Goltz: No] Patrice Paradise: No] [Left: Edema] [Right: :] Calf Left: Right: Point of Measurement: 33 cm  From Medial Instep 42.5 cm 41 cm Ankle Left: Right: Point of Measurement: 8 cm From Medial Instep 24.5 cm 22.5 cm Vascular Assessment Pulses: Dorsalis Pedis Palpable: [Left:Yes] [Right:Yes] Electronic Signature(s) Signed: 02/03/2020 6:03:28 PM By: Gretta Cool, BSN, RN, CWS, Kim RN, BSN Entered By: Gretta Cool, BSN, RN, CWS, Kim on 02/03/2020 13:01:41 [name] ([id number]) -------------------------------------------------------------------------------- Multi Wound Chart Details Patient Name: [name], Jillian B. Date of Service: 02/03/2020 12:45 PM Medical Record Number: [id number] Patient Account Number: [id number] Date of Birth/Sex: 1945-10-21 (75 y.o. F) Treating RN: Dolan Amen Primary Care Guerin Lashomb: Lelon Huh Other Clinician: Referring Siena Poehler: Lelon Huh Treating Shasha Buchbinder/Extender: Skipper Cliche in Treatment: 7 Vital Signs Height(in): 64 Pulse(bpm): 69 Weight(lbs): 205 Blood Pressure(mmHg): 186/93 Body Mass Index(BMI): 35 Temperature(F): 98.2 Respiratory Rate(breaths/min): 18 Photos: [N/A:N/A] Wound Location: Left,  Proximal, Lateral Lower Leg Left, Lateral Ankle N/A Wounding Event: Gradually Appeared Gradually Appeared N/A Primary Etiology: Lymphedema Lymphedema N/A Date Acquired: 01/27/2020 01/27/2020 N/A Weeks of Treatment: 1 1 N/A Wound Status: Open Healed - Epithelialized N/A Measurements L x W x D (cm) 1.4x0.4x0.1 0x0x0 N/A Area (cm) : 0.44 0 N/A Volume (cm) : 0.044 0 N/A % Reduction in Area: 36.30% 100.00% N/A % Reduction in Volume: 36.20% 100.00% N/A Classification: Full Thickness Without Exposed Full Thickness Without Exposed N/A Support Structures Support Structures Treatment Notes Electronic Signature(s) Signed: 02/03/2020 4:36:53 PM By: Georges Mouse, Minus Breeding RN Entered By: Georges Mouse, Minus Breeding on 02/03/2020 13:08:20 [name] ([id number]) -------------------------------------------------------------------------------- [place] Details Patient Name: [name], Jillian B. Date of Service: 02/03/2020 12:45 PM Medical Record Number: [id number] Patient Account Number: [id number] Date of Birth/Sex: 11-29-1945 (75 y.o. F) Treating RN: Dolan Amen Primary Care Emberli Ballester: Lelon Huh Other Clinician: Referring Oddie Bottger: Lelon Huh Treating Quinlyn Tep/Extender: Skipper Cliche in Treatment: 7 Active Inactive Pain, Acute or Chronic Nursing Diagnoses: Pain, acute or chronic: actual or potential Goals: Patient will verbalize adequate pain control and receive pain control interventions during procedures as needed Date Initiated: 12/11/2019 Target Resolution Date: 01/11/2020 Goal Status: Active Patient/caregiver will verbalize comfort level met Date Initiated: 12/11/2019 Target Resolution Date: 01/11/2020 Goal Status: Active Interventions: Assess comfort goal upon admission Complete pain assessment as per visit requirements Notes: Wound/Skin Impairment Nursing Diagnoses: Impaired tissue integrity Goals: Patient/caregiver will verbalize understanding of skin care  regimen Date Initiated: 12/11/2019 Target Resolution Date: 01/11/2020 Goal Status: Active Ulcer/skin breakdown will have a volume reduction of 30% by week 4 Date Initiated: 12/11/2019 Target Resolution Date: 01/11/2020 Goal Status: Active Interventions: Assess patient/caregiver ability to obtain necessary supplies Assess patient/caregiver ability to perform ulcer/skin care regimen upon admission and as needed Provide education on ulcer and skin care Treatment Activities: Skin care regimen initiated : 12/11/2019 Notes: Electronic Signature(s) Signed: 02/03/2020 4:36:53 PM By: Georges Mouse, Minus Breeding RN Entered By: Georges Mouse, Minus Breeding on 02/03/2020 13:07:44 [name] ([id number]) -------------------------------------------------------------------------------- Non-Wound Condition Assessment Details Patient Name: Jillian Albe B. Date of Service: 02/03/2020 12:45 PM Medical Record Number: [id number] Patient Account Number: [id number] Date of Birth/Sex: 02-20-1945 (75 y.o. F) Treating RN: Carlene Coria Primary Care Norvell Ureste: Lelon Huh Other Clinician: Referring Eliodoro Gullett: Lelon Huh Treating Basilio Meadow/Extender: Skipper Cliche in Treatment: 7 Non-Wound Condition: Condition: Lymphedema Location: Leg Side: Right Electronic Signature(s) Signed: 02/04/2020 9:59:20 AM By: Carlene Coria RN Entered By: Carlene Coria on 02/03/2020 13:40:05 [name] ([id number]) -------------------------------------------------------------------------------- Pain Assessment Details Patient Name: [name], Jillian B. Date of Service: 02/03/2020 12:45 PM Medical Record Number: [id number] Patient Account Number: [id number] Date of Birth/Sex: May 03, 1945 (75 y.o. F) Treating RN:  Cornell Barman Primary Care Bernette Seeman: Lelon Huh Other Clinician: Referring Adonica Fukushima: Lelon Huh Treating Markeis Allman/Extender: Skipper Cliche in Treatment: 7 Active Problems Location of Pain Severity and Description of  Pain Patient Has Paino No Site Locations Pain Management and Medication Current Pain Management: Notes Patient denies pain at this time. Electronic Signature(s) Signed: 02/03/2020 6:03:28 PM By: Gretta Cool, BSN, RN, CWS, Kim RN, BSN Entered By: Gretta Cool, BSN, RN, CWS, Kim on 02/03/2020 12:57:26 [name] ([id number]) -------------------------------------------------------------------------------- Patient/Caregiver Education Details Patient Name: [name], LAWYER. Date of Service: 02/03/2020 12:45 PM Medical Record Number: [id number] Patient Account Number: [id number] Date of Birth/Gender: 03-24-1945 (75 y.o. F) Treating RN: Dolan Amen Primary Care Physician: Lelon Huh Other Clinician: Referring Physician: Lelon Huh Treating Physician/Extender: Skipper Cliche in Treatment: 7 Education Assessment Education Provided To: Patient and Caregiver Education Topics Provided Wound/Skin Impairment: Methods: Explain/Verbal Responses: State content correctly Electronic Signature(s) Signed: 02/03/2020 4:36:53 PM By: Georges Mouse, Minus Breeding RN Entered By: Georges Mouse, Minus Breeding on 02/03/2020 13:10:59 [name] ([id number]) -------------------------------------------------------------------------------- Wound Assessment Details Patient Name: [name], Jillian B. Date of Service: 02/03/2020 12:45 PM Medical Record Number: [id number] Patient Account Number: [id number] Date of Birth/Sex: 1945-06-23 (75 y.o. F) Treating RN: Cornell Barman Primary Care Sujay Grundman: Lelon Huh Other Clinician: Referring Hudson Majkowski: Lelon Huh Treating Dayan Desa/Extender: Skipper Cliche in Treatment: 7 Wound Status Wound Number: 24 Primary Etiology: Lymphedema Wound Location: Left, Proximal, Lateral Lower Leg Wound Status: Open Wounding Event: Gradually Appeared Date Acquired: 01/27/2020 Weeks Of Treatment: 1 Clustered Wound: No Photos Photo Uploaded By: Gretta Cool, BSN, RN, CWS, Kim on 02/03/2020  13:02:29 Wound Measurements Length: (cm) 1.4 Width: (cm) 0.4 Depth: (cm) 0.1 Area: (cm) 0.44 Volume: (cm) 0.044 % Reduction in Area: 36.3% % Reduction in Volume: 36.2% Wound Description Classification: Full Thickness Without Exposed Support Structu res Treatment Notes Wound #24 (Lower Leg) Wound Laterality: Left, Lateral, Proximal Cleanser Soap and Water Discharge Instruction: Gently cleanse wound with antibacterial soap, rinse and pat dry prior to dressing wounds Peri-Wound Care Triamcinolone Acetonide Cream, 0.1%, 15 (g) tube Topical Primary Dressing Secondary Dressing ABD Pad 5x9 (in/in) Discharge Instruction: Cover with ABD pad Secured With Compression Wrap Profore Lite LF Wal[place], Ladelle B. ([id number]) Discharge Instruction: Apply 3 multi-layer wrap as prescribed. Unna w/Calamine, 4x10 (in/yd) Discharge Instruction: Apply unna paste -3 finger-widths below knee to anchor compression wrap in place. Compression Stockings Environmental education officer) Signed: 02/03/2020 6:03:28 PM By: Gretta Cool, BSN, RN, CWS, Kim RN, BSN Entered By: Gretta Cool, BSN, RN, CWS, Kim on 02/03/2020 12:58:34 [name] ([id number]) -------------------------------------------------------------------------------- Wound Assessment Details Patient Name: [name], Jillian B. Date of Service: 02/03/2020 12:45 PM Medical Record Number: [id number] Patient Account Number: [id number] Date of Birth/Sex: 19-Jan-1945 (75 y.o. F) Treating RN: Dolan Amen Primary Care Nasiah Lehenbauer: Lelon Huh Other Clinician: Referring Rihana Kiddy: Lelon Huh Treating Sumiye Hirth/Extender: Skipper Cliche in Treatment: 7 Wound Status Wound Number: 25 Primary Etiology: Lymphedema Wound Location: Left, Lateral Ankle Wound Status: Healed - Epithelialized Wounding Event: Gradually Appeared Date Acquired: 01/27/2020 Weeks Of Treatment: 1 Clustered Wound: No Photos Photo Uploaded By: Gretta Cool,  BSN, RN, CWS, Kim on 02/03/2020 13:02:29 Wound Measurements Length: (cm) Width: (cm) Depth: (cm) Area: (cm) Volume: (cm) 0 % Reduction in Area: 100% 0 % Reduction in Volume: 100% 0 0 0 Wound Description Classification: Full Thickness Without Exposed Support Structu res Treatment Notes Wound #25 (Ankle) Wound Laterality: Left, Lateral Cleanser Peri-Wound Care Topical Primary Dressing Secondary Dressing Secured With Compression Wrap Compression Stockings Add-Ons Electronic Signature(s)  Signed: 02/03/2020 4:36:53 PM By: Georges Mouse, Tabor, Jeanice Lim ([id number]) Entered By: Georges Mouse, Minus Breeding on 02/03/2020 13:05:57 [name] ([id number]) -------------------------------------------------------------------------------- [place] Details Patient Name: [name], [name]. Date of Service: 02/03/2020 12:45 PM Medical Record Number: [id number] Patient Account Number: [id number] Date of Birth/Sex: 1945-06-02 (75 y.o. F) Treating RN: Dolan Amen Primary Care Lajune Perine: Lelon Huh Other Clinician: Referring Arvell Pulsifer: Lelon Huh Treating Dorene Bruni/Extender: Skipper Cliche in Treatment: 7 Vital Signs Time Taken: 12:45 Temperature (F): 98.2 Height (in): 64 Pulse (bpm): 69 Weight (lbs): 205 Respiratory Rate (breaths/min): 18 Body Mass Index (BMI): 35.2 Blood Pressure (mmHg): 186/93 Reference Range: 80 - 120 mg / dl Electronic Signature(s) Signed: 02/03/2020 4:20:22 PM By: Lorine Bears RCP, RRT, CHT Entered By: Lorine Bears on 02/03/2020 12:48:30

## 2020-02-10 ENCOUNTER — Other Ambulatory Visit: Payer: Self-pay

## 2020-02-10 ENCOUNTER — Encounter: Payer: Medicare Other | Admitting: Physician Assistant

## 2020-02-10 DIAGNOSIS — L03116 Cellulitis of left lower limb: Secondary | ICD-10-CM | POA: Diagnosis not present

## 2020-02-10 DIAGNOSIS — I10 Essential (primary) hypertension: Secondary | ICD-10-CM | POA: Diagnosis not present

## 2020-02-10 DIAGNOSIS — I428 Other cardiomyopathies: Secondary | ICD-10-CM | POA: Diagnosis not present

## 2020-02-10 DIAGNOSIS — I272 Pulmonary hypertension, unspecified: Secondary | ICD-10-CM | POA: Diagnosis not present

## 2020-02-10 DIAGNOSIS — L97212 Non-pressure chronic ulcer of right calf with fat layer exposed: Secondary | ICD-10-CM | POA: Diagnosis not present

## 2020-02-10 DIAGNOSIS — L03115 Cellulitis of right lower limb: Secondary | ICD-10-CM | POA: Diagnosis not present

## 2020-02-10 DIAGNOSIS — I2510 Atherosclerotic heart disease of native coronary artery without angina pectoris: Secondary | ICD-10-CM | POA: Diagnosis not present

## 2020-02-10 DIAGNOSIS — E119 Type 2 diabetes mellitus without complications: Secondary | ICD-10-CM | POA: Diagnosis not present

## 2020-02-10 DIAGNOSIS — I872 Venous insufficiency (chronic) (peripheral): Secondary | ICD-10-CM | POA: Diagnosis not present

## 2020-02-10 DIAGNOSIS — I890 Lymphedema, not elsewhere classified: Secondary | ICD-10-CM | POA: Diagnosis not present

## 2020-02-10 NOTE — Progress Notes (Signed)
[name], [name] ([id number]) Visit Report for 02/10/2020 Arrival Information Details Patient Name: [name], [name]. Date of Service: 02/10/2020 12:45 PM Medical Record Number: [id number] Patient Account Number: [id number] Date of Birth/Sex: 08/13/1945 (75 y.o. Female) Treating RN: Dolan Amen Primary Care Provider: Lelon Huh Other Clinician: Referring Provider: Lelon Huh Treating Provider/Extender: Skipper Cliche in Treatment: 8 Visit Information History Since Last Visit Has Compression in Place as Prescribed: Yes Patient Arrived: Wheel Chair Pain Present Now: No Arrival Time: 12:37 Accompanied By: husband Transfer Assistance: None Patient Identification Verified: Yes Secondary Verification Process Completed: Yes Patient Requires Transmission-Based Precautions: No Patient Has Alerts: No Electronic Signature(s) Signed: 02/10/2020 4:26:47 PM By: Georges Mouse, Minus Breeding RN Entered By: Georges Mouse, Minus Breeding on 02/10/2020 12:37:48 [name] ([id number]) -------------------------------------------------------------------------------- Clinic Level of Care Assessment Details Patient Name: [name] Date of Service: 02/10/2020 12:45 PM Medical Record Number: [id number] Patient Account Number: [id number] Date of Birth/Sex: 1945/05/15 (75 y.o. Female) Treating RN: Dolan Amen Primary Care Provider: Lelon Huh Other Clinician: Referring Provider: Lelon Huh Treating Provider/Extender: Skipper Cliche in Treatment: 8 Clinic Level of Care Assessment Items TOOL 1 Quantity Score _0  - Use when EandM and Procedure is performed on INITIAL visit 0 ASSESSMENTS - Nursing Assessment / Reassessment _1  - General Physical Exam (combine w/ comprehensive assessment (listed just below) when performed on new 0 pt. evals) _2  - 0 Comprehensive Assessment (HX, ROS, Risk Assessments, Wounds Hx, etc.) ASSESSMENTS - Wound and Skin Assessment / Reassessment _3  - Dermatologic /  Skin Assessment (not related to wound area) 0 ASSESSMENTS - Ostomy and/or Continence Assessment and Care _4  - Incontinence Assessment and Management 0 _5  - 0 Ostomy Care Assessment and Management (repouching, etc.) PROCESS - Coordination of Care _6  - Simple Patient / Family Education for ongoing care 0 _7  - 0 Complex (extensive) Patient / Family Education for ongoing care _8  - 0 Staff obtains Programmer, systems, Records, Test Results / Process Orders _9  - 0 Staff telephones HHA, Nursing Homes / Clarify orders / etc _10  - 0 Routine Transfer to another Facility (non-emergent condition) _11  - 0 Routine Hospital Admission (non-emergent condition) _12  - 0 New Admissions / Biomedical engineer / Ordering NPWT, Apligraf, etc. _13  - 0 Emergency Hospital Admission (emergent condition) PROCESS - Special Needs _14  - Pediatric / Minor Patient Management 0 _15  - 0 Isolation Patient Management _16  - 0 Hearing / Language / Visual special needs _17  - 0 Assessment of Community assistance (transportation, D/C planning, etc.) _18  - 0 Additional assistance / Altered mentation _19  - 0 Support Surface(s) Assessment (bed, cushion, seat, etc.) INTERVENTIONS - Miscellaneous _20  - External ear exam 0 _21  - 0 Patient Transfer (multiple staff / Civil Service fast streamer / Similar devices) _22  - 0 Simple Staple / Suture removal (25 or less) _23  - 0 Complex Staple / Suture removal (26 or more) _24  - 0 Hypo/Hyperglycemic Management (do not check if billed separately) _25  - 0 Ankle / Brachial Index (ABI) - do not check if billed separately Has the patient been seen at the hospital within the last three years: Yes Total Score: 0 Level Of Care: ____ [name] ([id number]) Electronic Signature(s) Signed: 02/10/2020 4:26:47 PM By: Georges Mouse, Minus Breeding RN Entered By: Georges Mouse, Minus Breeding on 02/10/2020 13:28:45 [name] ([id number]) -------------------------------------------------------------------------------- Compression  Therapy Details Patient Name: Jillian Albe B. Date of Service: 02/10/2020 12:45 PM Medical Record Number: [id number] Patient Account Number: [id number] Date of Birth/Sex: 1945/09/05 (75 y.o. Female) Treating RN: Dolan Amen Primary Care Provider: Lelon Huh  Other Clinician: Referring Provider: Lelon Huh Treating Provider/Extender: Skipper Cliche in Treatment: 8 Compression Therapy Performed for Wound Assessment: Wound #26 Right,Posterior Lower Leg Performed By: Cora Daniels, RN Compression Type: Three Layer Post Procedure Diagnosis Same as Pre-procedure Notes pt tolerating wrap well Electronic Signature(s) Signed: 02/10/2020 4:26:47 PM By: Georges Mouse, Minus Breeding RN Entered By: Georges Mouse, Minus Breeding on 02/10/2020 13:04:39 [name] ([id number]) -------------------------------------------------------------------------------- Compression Therapy Details Patient Name: [name], Jillian B. Date of Service: 02/10/2020 12:45 PM Medical Record Number: [id number] Patient Account Number: [id number] Date of Birth/Sex: [birth year] (75 y.o. Female) Treating RN: Dolan Amen Primary Care Provider: Lelon Huh Other Clinician: Referring Provider: Lelon Huh Treating Provider/Extender: Skipper Cliche in Treatment: 8 Compression Therapy Performed for Wound Assessment: NonWound Condition Lymphedema - Left Leg Performed By: Clinician Dolan Amen, RN Compression Type: Three Layer Post Procedure Diagnosis Same as Pre-procedure Notes pt tolerating wrap well Electronic Signature(s) Signed: 02/10/2020 4:26:47 PM By: Georges Mouse, Minus Breeding RN Entered By: Georges Mouse, Minus Breeding on 02/10/2020 13:05:08 [name] ([id number]) -------------------------------------------------------------------------------- Encounter Discharge Information Details Patient Name: [name], Jillian B. Date of Service: 02/10/2020 12:45 PM Medical Record Number: [id number] Patient Account  Number: [id number] Date of Birth/Sex: Mar 12, 1945 (75 y.o. Female) Treating RN: Dolan Amen Primary Care Provider: Lelon Huh Other Clinician: Referring Provider: Lelon Huh Treating Provider/Extender: Skipper Cliche in Treatment: 8 Encounter Discharge Information Items Discharge Condition: Stable Ambulatory Status: Wheelchair Discharge Destination: Home Transportation: Private Auto Accompanied By: spouse Schedule Follow-up Appointment: Yes Clinical Summary of Care: Electronic Signature(s) Signed: 02/10/2020 4:26:47 PM By: Georges Mouse, Minus Breeding RN Entered By: Georges Mouse, Minus Breeding on 02/10/2020 13:29:49 [name] ([id number]) -------------------------------------------------------------------------------- Lower Extremity Assessment Details Patient Name: [name], Jillian B. Date of Service: 02/10/2020 12:45 PM Medical Record Number: [id number] Patient Account Number: [id number] Date of Birth/Sex: 1945/06/16 (75 y.o. Female) Treating RN: Dolan Amen Primary Care Provider: Lelon Huh Other Clinician: Referring Provider: Lelon Huh Treating Provider/Extender: Skipper Cliche in Treatment: 8 Edema Assessment Assessed: Shirlyn Goltz: Yes] Patrice Paradise: Yes] Edema: [Left: Yes] [Right: Yes] Calf Left: Right: Point of Measurement: 33 cm From Medial Instep 39.5 cm 38 cm Ankle Left: Right: Point of Measurement: 8 cm From Medial Instep 23.5 cm 21.5 cm Vascular Assessment Pulses: Dorsalis Pedis Palpable: [Left:Yes] [Right:Yes] Electronic Signature(s) Signed: 02/10/2020 4:26:47 PM By: Georges Mouse, Minus Breeding RN Entered By: Georges Mouse, Minus Breeding on 02/10/2020 12:55:24 [name] ([id number]) -------------------------------------------------------------------------------- Multi Wound Chart Details Patient Name: Jillian Albe B. Date of Service: 02/10/2020 12:45 PM Medical Record Number: [id number] Patient Account Number: [id number] Date of Birth/Sex: [birth year] (75 y.o.  Female) Treating RN: Dolan Amen Primary Care Provider: Lelon Huh Other Clinician: Referring Provider: Lelon Huh Treating Provider/Extender: Skipper Cliche in Treatment: 8 Vital Signs Height(in): 64 Pulse(bpm): 29 Weight(lbs): 205 Blood Pressure(mmHg): 167/83 Body Mass Index(BMI): 35 Temperature(F): 97.5 Respiratory Rate(breaths/min): 20 Photos: [N/A:N/A] Wound Location: Left, Proximal, Lateral Lower Leg Right, Posterior Lower Leg N/A Wounding Event: Gradually Appeared Gradually Appeared N/A Primary Etiology: Lymphedema Lymphedema N/A Comorbid History: Lymphedema, Asthma, Congestive Lymphedema, Asthma, Congestive N/A Heart Failure, Coronary Artery Heart Failure, Coronary Artery Disease, Hypertension, Type II Disease, Hypertension, Type II Diabetes, Osteoarthritis Diabetes, Osteoarthritis Date Acquired: 01/27/2020 02/10/2020 N/A Weeks of Treatment: 2 0 N/A Wound Status: Open Open N/A Measurements L x W x D (cm) 0.1x0.1x0.1 1x1x0.1 N/A Area (cm) : 0.008 0.785 N/A Volume (cm) : 0.001 0.079 N/A % Reduction in Area: 98.80% 0.00% N/A % Reduction in Volume: 98.60% 0.00% N/A Classification: Partial Thickness Full Thickness Without Exposed N/A  Support Structures Exudate Amount: None Present Medium N/A Exudate Type: N/A Serous N/A Exudate Color: N/A amber N/A Granulation Amount: None Present (0%) Large (67-100%) N/A Granulation Quality: N/A Red N/A Necrotic Amount: None Present (0%) Small (1-33%) N/A Exposed Structures: Fascia: No Fat Layer (Subcutaneous Tissue): N/A Fat Layer (Subcutaneous Tissue): Yes No Fascia: No Tendon: No Tendon: No Muscle: No Muscle: No Joint: No Joint: No Bone: No Bone: No Epithelialization: Large (67-100%) None N/A Treatment Notes Electronic Signature(s) Signed: 02/10/2020 4:26:47 PM By: Georges Mouse, Minus Breeding RN Entered By: Georges Mouse, Minus Breeding on 02/10/2020 13:01:44 [name]  ([id number]) -------------------------------------------------------------------------------- Multi-Disciplinary Care Plan Details Patient Name: [name], Jillian B. Date of Service: 02/10/2020 12:45 PM Medical Record Number: [id number] Patient Account Number: [id number] Date of Birth/Sex: 07-10-1945 (75 y.o. Female) Treating RN: Dolan Amen Primary Care Provider: Lelon Huh Other Clinician: Referring Provider: Lelon Huh Treating Provider/Extender: Skipper Cliche in Treatment: 8 Active Inactive Pain, Acute or Chronic Nursing Diagnoses: Pain, acute or chronic: actual or potential Goals: Patient will verbalize adequate pain control and receive pain control interventions during procedures as needed Date Initiated: 12/11/2019 Target Resolution Date: 01/11/2020 Goal Status: Active Patient/caregiver will verbalize comfort level met Date Initiated: 12/11/2019 Target Resolution Date: 01/11/2020 Goal Status: Active Interventions: Assess comfort goal upon admission Complete pain assessment as per visit requirements Notes: Wound/Skin Impairment Nursing Diagnoses: Impaired tissue integrity Goals: Patient/caregiver will verbalize understanding of skin care regimen Date Initiated: 12/11/2019 Target Resolution Date: 01/11/2020 Goal Status: Active Ulcer/skin breakdown will have a volume reduction of 30% by week 4 Date Initiated: 12/11/2019 Target Resolution Date: 01/11/2020 Goal Status: Active Interventions: Assess patient/caregiver ability to obtain necessary supplies Assess patient/caregiver ability to perform ulcer/skin care regimen upon admission and as needed Provide education on ulcer and skin care Treatment Activities: Skin care regimen initiated : 12/11/2019 Notes: Electronic Signature(s) Signed: 02/10/2020 4:26:47 PM By: Georges Mouse, Minus Breeding RN Entered By: Georges Mouse, Minus Breeding on 02/10/2020 13:03:43 [name]  ([id number]) -------------------------------------------------------------------------------- Pain Assessment Details Patient Name: Jillian Albe B. Date of Service: 02/10/2020 12:45 PM Medical Record Number: [id number] Patient Account Number: [id number] Date of Birth/Sex: October 02, 1945 (75 y.o. Female) Treating RN: Dolan Amen Primary Care Provider: Lelon Huh Other Clinician: Referring Provider: Lelon Huh Treating Provider/Extender: Skipper Cliche in Treatment: 8 Active Problems Location of Pain Severity and Description of Pain Patient Has Paino No Site Locations Rate the pain. Current Pain Level: 0 Pain Management and Medication Current Pain Management: Electronic Signature(s) Signed: 02/10/2020 4:26:47 PM By: Georges Mouse, Minus Breeding RN Entered By: Georges Mouse, Minus Breeding on 02/10/2020 12:40:29 [name] ([id number]) -------------------------------------------------------------------------------- Patient/Caregiver Education Details Patient Name: [name] Date of Service: 02/10/2020 12:45 PM Medical Record Number: [id number] Patient Account Number: [id number] Date of Birth/Gender: 20-Sep-1945 (75 y.o. Female) Treating RN: Dolan Amen Primary Care Physician: Lelon Huh Other Clinician: Referring Physician: Lelon Huh Treating Physician/Extender: Skipper Cliche in Treatment: 8 Education Assessment Education Provided To: Patient and Caregiver spouse Education Topics Provided Wound/Skin Impairment: Methods: Explain/Verbal Responses: State content correctly Electronic Signature(s) Signed: 02/10/2020 4:26:47 PM By: Georges Mouse, Minus Breeding RN Entered By: Georges Mouse, Minus Breeding on 02/10/2020 13:29:10 [name] ([id number]) -------------------------------------------------------------------------------- Wound Assessment Details Patient Name: [name], Jillian B. Date of Service: 02/10/2020 12:45 PM Medical Record Number: [id number] Patient Account Number:  [id number] Date of Birth/Sex: May 22, 1945 (75 y.o. Female) Treating RN: Dolan Amen Primary Care Provider: Lelon Huh Other Clinician: Referring Provider: Lelon Huh Treating Provider/Extender: Skipper Cliche in Treatment: 8 Wound Status Wound Number: 24 Primary Lymphedema  Etiology: Wound Location: Left, Proximal, Lateral Lower Leg Wound Healed - Epithelialized Wounding Event: Gradually Appeared Status: Date Acquired: 01/27/2020 Comorbid Lymphedema, Asthma, Congestive Heart Failure, Weeks Of Treatment: 2 History: Coronary Artery Disease, Hypertension, Type II Diabetes, Clustered Wound: No Osteoarthritis Photos Wound Measurements Length: (cm) 0 % Width: (cm) 0 % Depth: (cm) 0 Ep Area: (cm) 0 T Volume: (cm) 0 U Reduction in Area: 100% Reduction in Volume: 100% ithelialization: Large (67-100%) unneling: No ndermining: No Wound Description Classification: Partial Thickness Fo Exudate Amount: None Present Sl ul Odor After Cleansing: No ough/Fibrino No Wound Bed Granulation Amount: None Present (0%) Exposed Structure Necrotic Amount: None Present (0%) Fascia Exposed: No Fat Layer (Subcutaneous Tissue) Exposed: No Tendon Exposed: No Muscle Exposed: No Joint Exposed: No Bone Exposed: No Treatment Notes Wound #24 (Lower Leg) Wound Laterality: Left, Lateral, Proximal Cleanser Peri-Wound Care Topical Primary Dressing EMMAMARIE, KLUENDER ([id number]) Secondary Dressing Secured With Compression Wrap Compression Stockings Add-Ons Electronic Signature(s) Signed: 02/10/2020 4:26:47 PM By: Georges Mouse, Minus Breeding RN Entered By: Georges Mouse, Minus Breeding on 02/10/2020 13:05:55 [name] ([id number]) -------------------------------------------------------------------------------- Wound Assessment Details Patient Name: Jillian Albe B. Date of Service: 02/10/2020 12:45 PM Medical Record Number: [id number] Patient Account Number: [id number] Date of Birth/Sex: Jul 16, 1945  (75 y.o. Female) Treating RN: Dolan Amen Primary Care Provider: Lelon Huh Other Clinician: Referring Provider: Lelon Huh Treating Provider/Extender: Skipper Cliche in Treatment: 8 Wound Status Wound Number: 26 Primary Lymphedema Etiology: Wound Location: Right, Posterior Lower Leg Wound Open Wounding Event: Gradually Appeared Status: Date Acquired: 02/10/2020 Comorbid Lymphedema, Asthma, Congestive Heart Failure, Weeks Of Treatment: 0 History: Coronary Artery Disease, Hypertension, Type II Diabetes, Clustered Wound: No Osteoarthritis Photos Wound Measurements Length: (cm) 1 Width: (cm) 1 Depth: (cm) 0.1 Area: (cm) 0.785 Volume: (cm) 0.079 % Reduction in Area: 0% % Reduction in Volume: 0% Epithelialization: None Tunneling: No Undermining: No Wound Description Classification: Full Thickness Without Exposed Support Structures Exudate Amount: Medium Exudate Type: Serous Exudate Color: amber Foul Odor After Cleansing: No Slough/Fibrino Yes Wound Bed Granulation Amount: Large (67-100%) Exposed Structure Granulation Quality: Red Fascia Exposed: No Necrotic Amount: Small (1-33%) Fat Layer (Subcutaneous Tissue) Exposed: Yes Necrotic Quality: Adherent Slough Tendon Exposed: No Muscle Exposed: No Joint Exposed: No Bone Exposed: No Treatment Notes Wound #26 (Lower Leg) Wound Laterality: Right, Posterior Cleanser Normal Saline Discharge Instruction: Wash your hands with soap and water. Remove old dressing, discard into plastic bag and place into trash. Cleanse the wound with Normal Saline prior to applying a clean dressing using gauze sponges, not tissues or cotton balls. Do not scrub or use excessive force. Pat dry using gauze sponges, not tissue or cotton balls. NAW, LASALA ([id number]) Soap and Water Discharge Instruction: Gently cleanse wound with antibacterial soap, rinse and pat dry prior to dressing wounds [place] Discharge Instruction: Suggestions: Theraderm, Eucerin, Cetaphil, or patient preference. Triamcinolone Acetonide Cream, 0.1%, 15 (g) tube Topical Primary Dressing Silvercel Small 2x2 (in/in) Discharge Instruction: Apply Silvercel Small 2x2 (in/in) as instructed Secondary Dressing ABD Pad 5x9 (in/in) Discharge Instruction: Cover with ABD pad Secured With Compression Wrap Profore Lite LF 3 Multilayer Compression Bandaging System Discharge Instruction: Apply 3 multi-layer wrap as prescribed. Unna w/Calamine, 4x10 (in/yd) Discharge Instruction: Apply unna paste -3 finger-widths below knee to anchor compression wrap in place. Compression Stockings Add-Ons Electronic Signature(s) Signed: 02/10/2020 4:26:47 PM By: Georges Mouse, Minus Breeding RN Entered By: Georges Mouse, Minus Breeding on 02/10/2020 12:53:29 [name] ([id number]) -------------------------------------------------------------------------------- Vitals Details Patient Name: [name]  Date of Service: 02/10/2020 12:45 PM Medical Record Number: [id number] Patient Account Number: [id number] Date of Birth/Sex: 17-Apr-1945 (75 y.o. Female) Treating RN: Dolan Amen Primary Care Provider: Lelon Huh Other Clinician: Referring Provider: Lelon Huh Treating Provider/Extender: Skipper Cliche in Treatment: 8 Vital Signs Time Taken: 12:40 Temperature (F): 97.5 Height (in): 64 Pulse (bpm): 58 Weight (lbs): 205 Respiratory Rate (breaths/min): 20 Body Mass Index (BMI): 35.2 Blood Pressure (mmHg): 167/83 Reference Range: 80 - 120 mg / dl Electronic Signature(s) Signed: 02/10/2020 4:26:47 PM By: Georges Mouse, Minus Breeding RN Entered By: Georges Mouse, Minus Breeding on 02/10/2020 12:40:14

## 2020-02-10 NOTE — Progress Notes (Addendum)
CHANITA, BODEN ([id number]) Visit Report for 02/10/2020 Chief Complaint Document Details Patient Name: [name], [name]. Date of Service: 02/10/2020 12:45 PM Medical Record Number: [id number] Patient Account Number: [id number] Date of Birth/Sex: 1945/02/26 (75 y.o. Female) Treating RN: Dolan Amen Primary Care Provider: Lelon Huh Other Clinician: Referring Provider: Lelon Huh Treating Provider/Extender: Skipper Cliche in Treatment: 8 Information Obtained from: Patient Chief Complaint Patient seen for complaints of Non-Healing Wounds to both lower extremities Electronic Signature(s) Signed: 02/10/2020 1:05:06 PM By: Worthy Keeler PA-C Entered By: Worthy Keeler on 02/10/2020 13:05:06 [name] ([id number]) -------------------------------------------------------------------------------- HPI Details Patient Name: [name] Date of Service: 02/10/2020 12:45 PM Medical Record Number: [id number] Patient Account Number: [id number] Date of Birth/Sex: Nov 18, 1945 (75 y.o. Female) Treating RN: Dolan Amen Primary Care Provider: Lelon Huh Other Clinician: Referring Provider: Lelon Huh Treating Provider/Extender: Skipper Cliche in Treatment: 8 History of Present Illness Location: both lower extremity swelling with ulceration HPI Description: 75 year old patient who sees her PCP Dr. Lelon Huh was recently evaluated 10 days ago for diabetes mellitus, hypertension, CHF and hyperlipidemia. she also was noted to have ulcerations develop in her legs and she has been applying Silvadene dressings locally. In the past she has refused wound care referrals.her cardiologist Dr. Saunders Revel saw her and put her on 40 mg of furosemide daily. last hemoglobin A1c was 7.7%. she was also placed on ciprofloxacin twice daily for 7 days and a urine culture was recommended. past medical history significant for coronary artery disease, diabetes mellitus, nonischemic cardiomyopathy and  pulmonary hypertension. She is also status post heart catheterization and coronary angiography, tubal ligation and breast cyst removal in the past. She has never been a smoker. Patient had arterial studies done which showed bilateral ABIs are artificially elevated due to noncompressible and calcified vessels. Triphasic waveform throughout. Right great toe TBI is elevated while the left is normal. 10/23/2016 -- the patient is rather moribund from several issues including chronic back pain and knee pain and swelling of her legs. The large necrotic area on her left lateral anterior calf was bleeding on touch after washing her leg. There was a spot which needed silver nitrate cauterization and this was done appropriately. 11/06/2016 -- she is again noted to have friable bleeding from the left lower extremity wounds and this again had to be cauterized with silver nitrate to control the bleeding as pressure itself would not do it. 11/20/2016 -- the right lower extremity is completely healed and we have ordered 30-40 mm compression stocking's in both the dural layer and also a pair of juxta lites. 12/18/2016 -- she has not been wearing her compression stockings on her right leg and these have opened out into ulcerations again. Her left lower extremity has circumferential ulcerations now. I believe at this stage I would like to get her venous reflux studies done to make certain that there is no fixable superficial venous reflux. 12/29/2016 -- she has made an excellent recovery having continued with her appropriate doses of diuretics and elevation and exercise. She has not yet received her right lower extremity juxta lites. Her venous duplex study is at the end of January 01/13/16 on evaluation today patient's wounds appeared to be overall doing much better much less hyper granular than previous week's evaluation. The Hydrofera Blue Dressing's to be doing very well. She does tell me that she is having a  little bit more discomfort in the posterior aspect of her leg where she has a wound at this  point. Fortunately there appears to be no infection. 01/19/17 on evaluation today patient appears to be doing very well in regard to her bilateral lower extremity swelling and at this point in time her left lower extremity ulcers. Her wounds appear to be doing much better. She has been tolerating the dressing's at this point fortunately she did get the Juxta-Lite compression as of today as well. Nonetheless I am pleased that she has been tolerating everything so well and that her wounds looks so good. In fact she is an excellent granular surface no evidence of slough covering and I do not see any reason for likely debridement today. Especially in regard to the posterior leg. 01/26/17 on evaluation today patient appears to be doing decently well in regard to her wounds. The surface of the majority of her wounds is greatly improved. She has been using the Juxta-Lite compression which is excellent. Nonetheless the wound of the left posterior calf did require some debridement today otherwise the majority of the wounds did not require any debridement. The this was due to slough buildup on the surface. She also has a small skin flap where two of the ulcers actually connected and this has loosened up I'm concerned that things may not heal well with that flap but for the time being I'm not gonna do anything different in that regard. 02/23/17 on evaluation today patient's wound actually appears to be doing fairly well in regard to the remaining left lateral ulcer which we have been treated. With that being said it does appear that her wrap actually slid down the wood bit over the past few days at least that there's actually some friction injury and blistering noted on the medial aspect of her malleolus of the left lower extremity. She continues to use the Juxta- Lite for the right lower extremity. With that being said no  debridement is necessary today and these are very superficial injuries as far as the new injuries are concerned. 03/02/17 on evaluation today patient appears to be doing excellent in regard to her left lateral lower from the ulcers. In fact it appears that she is almost completely healed in regard to the ulcer we have been following and the new injuries which were caused by the wraps slipping down last week have been completely resolved which is excellent news. Overall I'm definitely pleased with the progress that she has made. Fortunately the rat did not cause any irritation as it did previously with the Unna wrap. 03/09/17 on evaluation today patient's wound appears to be completely healed. Obviously this is great news. She and her husband both are extremely pleased and excited to finally have this gone. Obviously she has been dealing with this wound for quite some time. Readmission: 04/14/2019 upon evaluation today patient appears for reevaluation here in our clinic although it has been since March 2019 since I last saw her. She unfortunately is having issues similar to what she had previous which are blisters and ulcerations of the bilateral lower extremities. Fortunately there is no evidence of active infection at this time based on what I am seeing. Unfortunately she is still having a lot of drainage and weeping. I do believe she needs to be compression wrap in order to get this under control and she is previously tolerated a 3 layer compression wrap without any complications whatsoever. No fevers, chills, nausea, vomiting, or diarrhea. [facility] ([id number]) 04/28/2019 upon evaluation today patient appears to be doing well with regard to the bilateral lower  extremities at this time. She has been tolerating the dressing changes without complication. Fortunately there is no signs of active infection at this time. No fevers, chills, nausea, vomiting, or diarrhea. 05/05/2019 upon evaluation today  patient appears to be doing excellent in regard to her legs. She has a lot of new skin growth and overall seems to be progressing quite nicely. Fortunately there is no signs of active infection at this time. No fevers, chills, nausea, vomiting, or diarrhea. 05/12/2019 upon evaluation today patient appears to be doing very well with regard to her lower extremities. She is not completely healed at this point but nonetheless he is doing much better even compared to last week. I am very pleased with where things stand. No fevers, chills, nausea, vomiting, or diarrhea. 05/26/2019 upon evaluation today patient actually appears to be doing worse with regard to her lower extremities. She has green drainage unfortunately is having more pain on the right lower extremity as well. Fortunately there is no signs of active infection systemically which is good news. 06/03/2019 upon evaluation today patient appears to be doing about the same in regard to the fact that she still has wounds on both lower extremities although all wounds are measuring better and appearing much better. She finally did start taking the medication which has made a big difference for her that is the Levaquin. Unfortunately this appears to be making her urinate much more frequently. She has been taking this in the evening therefore it is keeping her up at night. They are wondering if they can switch to taking this during the day. I would actually never instructed that they had to take this at night and I think it is definitely fine for her to take it in the morning. She will start that tomorrow. 06/10/2019 upon evaluation today patient appears to be doing excellent in regard to her lower extremities currently. The right lower extremity actually appears to be pretty much healed based on what I am seeing I do not see anything open or draining. The left lower extremity though not completely healed does appear to be doing much better which is great  news. Overall there is no signs of active infection at this time. 06/17/2019 upon evaluation today patient appears to be doing excellent in regard to her ulcers. She has been tolerating the dressing changes without complication in fact everything appears to be almost completely healed today. There is one area on the left lower extremity which appears to potentially still be weeping slightly and I think that we do need to see about wrapping her 1 more week just to make sure everything tightens up before discharge. 06/24/2019 upon evaluation today patient appears to be doing excellent in regard to her bilateral lower extremities. There does not appear to be any evidence of active infection which is great news and overall I am extremely pleased with the fact that again she appears to be ready for discharge. Readmission: 12/11/2019 patient is here for readmission today concerning her bilateral lower extremities. We have not seen her since June 2021. With that being said for the past 2 months apparently she has been having issues with her legs. It was noted by her husband that unfortunately he attempted to call to get an appointment with [place] but was not able to get anything in a sufficient amount of time to get her in sooner due to the fact that again we were having issues with staffing and scheduling. Therefore it was delayed getting him  into the wound care center for his wife. Nonetheless she appears to have evidence of cellulitis today of the bilateral lower extremities. Unfortunately she does not seem to be doing too well she is having a tremendous amount of pain and is asking for pain medication. Obviously it does look like her legs hurt although her wounds appear to be very superficial I am more concerned about the cellulitis aspect of this as opposed to the actual wound openings. 01/06/2020 upon evaluation today patient's legs actually appear to be doing significantly better as compared to last time I  saw her when we were seeing her back for the first evaluation and actually had to send her to the ER for further evaluation. She was admitted to the hospital and was actually there from 12/12/2019 through 12/15/2019. With that being said the patient was placed on IV antibiotics initially, Ancef and IV fluids. Subsequently upon discharge she was given Keflex. With that being said she is getting need an extension of the Keflex as she was only given 3 days in 1 to make sure this completely resolves and does not cause her any additional issues or concerns here. I will send that in for them today. 01/13/2020 upon evaluation today patient appears to be doing decently well in regard to her legs. The areas where she had wounds previously have actually improved which is great news. There does not appear to be any signs of active infection at this time which is great news. With IV NSAIDs she did have some itching on the top of her foot that prompted them to remove the dressing/compression wrap last week because she was just absolutely miserable over the weekend. Nonetheless I do think that we can do some things to try to help out here there may be a little bit of a fungal and for. We are going to treat that accordingly. 01/20/2020 upon evaluation today patient actually appears to be doing well with regard to her legs. In general I am very pleased with how things seem to be progressing. I do not see any signs of active infection at this time which is great news. Overall I think the patient is pleased as well she is not really having any pain and she tells me that itching is greatly improved 01/27/2020 upon evaluation today patient appears to be doing excellent in regard to her leg ulcers that were previously present. They have healed and seem to be doing great I see no signs of infection at this time. No fevers, chills, nausea, vomiting, or diarrhea. With that being said I do believe that the patient is making good  progress but still seems to be very fluid overloaded. We discussed the possibility that she may need to be back on the Lasix she was taken off this at the hospital but nonetheless I think she needs to have something on board to help with her edema. Otherwise I think she is going to continue to have significant issues with new blisters forming as we see this week 02/03/2020 upon evaluation today patient appears to be doing decently well in regard to her legs. Fortunately there is no signs of active infection at this time which is great news. She does have a very slight issue going on with a couple blisters that actually have not ruptured and seem to be drying up that is good news overall I do not see any signs of active infection at this time also good news. 02/10/2020 upon evaluation today patient appears  to be doing well with regard to her leg ulcers. In fact she is healing quite nicely and only has a small blister on the right leg with a small opening still remaining on the posterior aspect of her right leg. Other than these 2 areas the left leg appears to be completely healed as is everything else on the right. In general I feel like that the compression has done well and she is making good progress we just have not quite gotten to the point of everything being resolved as of yet. Fortunately patient shows no signs of active infection at this time. I am very pleased Electronic Signature(s) Signed: 02/10/2020 1:29:05 PM By: Curtis Sites, TRACINA BEAUMONT ([id number]) Entered By: Worthy Keeler on 02/10/2020 13:29:05 [name] ([id number]) -------------------------------------------------------------------------------- Physical Exam Details Patient Name: KENNI, NEWTON. Date of Service: 02/10/2020 12:45 PM Medical Record Number: [id number] Patient Account Number: [id number] Date of Birth/Sex: Jul 14, 1945 (75 y.o. Female) Treating RN: Dolan Amen Primary Care Provider: Lelon Huh Other  Clinician: Referring Provider: Lelon Huh Treating Provider/Extender: Skipper Cliche in Treatment: 8 Constitutional Well-nourished and well-hydrated in no acute distress. Respiratory normal breathing without difficulty. Psychiatric this patient is able to make decisions and demonstrates good insight into disease process. Alert and Oriented x 3. pleasant and cooperative. Notes Upon inspection patient's wound bed actually showed signs of good granulation at this time. There does not appear to be any evidence of active infection which is great news and overall I am extremely pleased with where things stand. I do not believe there is any evidence of anything worsening and definitely I think the infection is under good control we will continue to keep an eye on things closely. Electronic Signature(s) Signed: 02/10/2020 1:29:31 PM By: Worthy Keeler PA-C Entered By: Worthy Keeler on 02/10/2020 13:29:31 [name] ([id number]) -------------------------------------------------------------------------------- Physician Orders Details Patient Name: ELONNA, MCFARLANE. Date of Service: 02/10/2020 12:45 PM Medical Record Number: [id number] Patient Account Number: [id number] Date of Birth/Sex: [birth year] (75 y.o. Female) Treating RN: Dolan Amen Primary Care Provider: Lelon Huh Other Clinician: Referring Provider: Lelon Huh Treating Provider/Extender: Skipper Cliche in Treatment: 8 Verbal / Phone Orders: No Diagnosis Coding ICD-10 Coding Code Description I89.0 Lymphedema, not elsewhere classified I87.2 Venous insufficiency (chronic) (peripheral) L03.115 Cellulitis of right lower limb L03.116 Cellulitis of left lower limb Follow-up Appointments o Return Appointment in 1 week. o Nurse Visit as needed Bathing/ Shower/ Hygiene o May shower with wound dressing protected with water repellent cover or cast protector. Edema Control - Lymphedema / Segmental Compressive  Device / Other Bilateral Lower Extremities o Optional: One layer of unna paste to top of compression wrap (to act as an anchor). o 3 Layer Compression System for Lymphedema. o Elevate legs to the level of the heart and pump ankles as often as possible o Elevate leg(s) parallel to the floor when sitting. Wound Treatment Wound #26 - Lower Leg Wound Laterality: Right, Posterior Cleanser: Normal Saline (Generic) 1 x Per Week/30 Days Discharge Instructions: Wash your hands with soap and water. Remove old dressing, discard into plastic bag and place into trash. Cleanse the wound with Normal Saline prior to applying a clean dressing using gauze sponges, not tissues or cotton balls. Do not scrub or use excessive force. Pat dry using gauze sponges, not tissue or cotton balls. Cleanser: Soap and Water 1 x Per Week/30 Days Discharge Instructions: Gently cleanse wound with antibacterial soap, rinse and pat dry prior to dressing  wounds Peri-Wound Care: Moisturizing Lotion (Generic) 1 x Per Week/30 Days Discharge Instructions: Suggestions: Theraderm, Eucerin, Cetaphil, or patient preference. Peri-Wound Care: Triamcinolone Acetonide Cream, 0.1%, 15 (g) tube (Generic) 1 x Per Week/30 Days Primary Dressing: Silvercel Small 2x2 (in/in) (Generic) 1 x Per Week/30 Days Discharge Instructions: Apply Silvercel Small 2x2 (in/in) as instructed Secondary Dressing: ABD Pad 5x9 (in/in) (Generic) 1 x Per Week/30 Days Discharge Instructions: Cover with ABD pad Compression Wrap: Profore Lite LF 3 Multilayer Compression Bandaging System (Generic) 1 x Per Week/30 Days Discharge Instructions: Apply 3 multi-layer wrap as prescribed. Compression Wrap: Unna w/Calamine, 4x10 (in/yd) (Generic) 1 x Per Week/30 Days Discharge Instructions: Apply unna paste -3 finger-widths below knee to anchor compression wrap in place. Electronic Signature(s) Signed: 02/10/2020 4:26:47 PM By: Georges Mouse, Minus Breeding RN Signed: 02/10/2020  6:05:44 PM By: Curtis Sites, [place] BMarland Kitchen ([id number]) Entered By: Georges Mouse, Minus Breeding on 02/10/2020 13:28:20 [name] ([id number]) -------------------------------------------------------------------------------- Problem List Details Patient Name: ZYON, GROUT. Date of Service: 02/10/2020 12:45 PM Medical Record Number: [id number] Patient Account Number: [id number] Date of Birth/Sex: [birth year] (75 y.o. Female) Treating RN: Dolan Amen Primary Care Provider: Lelon Huh Other Clinician: Referring Provider: Lelon Huh Treating Provider/Extender: Skipper Cliche in Treatment: 8 Active Problems ICD-10 Encounter Code Description Active Date MDM Diagnosis I89.0 Lymphedema, not elsewhere classified 12/11/2019 No Yes I87.2 Venous insufficiency (chronic) (peripheral) 12/11/2019 No Yes L03.115 Cellulitis of right lower limb 12/11/2019 No Yes L03.116 Cellulitis of left lower limb 12/11/2019 No Yes Inactive Problems Resolved Problems Electronic Signature(s) Signed: 02/10/2020 12:59:56 PM By: Worthy Keeler PA-C Entered By: Worthy Keeler on 02/10/2020 12:59:56 [name] ([id number]) -------------------------------------------------------------------------------- Progress Note Details Patient Name: [name] Date of Service: 02/10/2020 12:45 PM Medical Record Number: [id number] Patient Account Number: [id number] Date of Birth/Sex: 1945/01/10 (75 y.o. Female) Treating RN: Dolan Amen Primary Care Provider: Lelon Huh Other Clinician: Referring Provider: Lelon Huh Treating Provider/Extender: Skipper Cliche in Treatment: 8 Subjective Chief Complaint Information obtained from Patient Patient seen for complaints of Non-Healing Wounds to both lower extremities History of Present Illness (HPI) The following HPI elements were documented for the patient's wound: Location: both lower extremity swelling with ulceration 75 year old patient who  sees her PCP Dr. Lelon Huh was recently evaluated 10 days ago for diabetes mellitus, hypertension, CHF and hyperlipidemia. she also was noted to have ulcerations develop in her legs and she has been applying Silvadene dressings locally. In the past she has refused wound care referrals.her cardiologist Dr. Saunders Revel saw her and put her on 40 mg of furosemide daily. last hemoglobin A1c was 7.7%. she was also placed on ciprofloxacin twice daily for 7 days and a urine culture was recommended. past medical history significant for coronary artery disease, diabetes mellitus, nonischemic cardiomyopathy and pulmonary hypertension. She is also status post heart catheterization and coronary angiography, tubal ligation and breast cyst removal in the past. She has never been a smoker. Patient had arterial studies done which showed bilateral ABIs are artificially elevated due to noncompressible and calcified vessels. Triphasic waveform throughout. Right great toe TBI is elevated while the left is normal. 10/23/2016 -- the patient is rather moribund from several issues including chronic back pain and knee pain and swelling of her legs. The large necrotic area on her left lateral anterior calf was bleeding on touch after washing her leg. There was a spot which needed silver nitrate cauterization and this was done appropriately. 11/06/2016 -- she is again noted to have  friable bleeding from the left lower extremity wounds and this again had to be cauterized with silver nitrate to control the bleeding as pressure itself would not do it. 11/20/2016 -- the right lower extremity is completely healed and we have ordered 30-40 mm compression stocking's in both the dural layer and also a pair of juxta lites. 12/18/2016 -- she has not been wearing her compression stockings on her right leg and these have opened out into ulcerations again. Her left lower extremity has circumferential ulcerations now. I believe at this stage I  would like to get her venous reflux studies done to make certain that there is no fixable superficial venous reflux. 12/29/2016 -- she has made an excellent recovery having continued with her appropriate doses of diuretics and elevation and exercise. She has not yet received her right lower extremity juxta lites. Her venous duplex study is at the end of January 01/13/16 on evaluation today patient's wounds appeared to be overall doing much better much less hyper granular than previous week's evaluation. The Hydrofera Blue Dressing's to be doing very well. She does tell me that she is having a little bit more discomfort in the posterior aspect of her leg where she has a wound at this point. Fortunately there appears to be no infection. 01/19/17 on evaluation today patient appears to be doing very well in regard to her bilateral lower extremity swelling and at this point in time her left lower extremity ulcers. Her wounds appear to be doing much better. She has been tolerating the dressing's at this point fortunately she did get the Juxta-Lite compression as of today as well. Nonetheless I am pleased that she has been tolerating everything so well and that her wounds looks so good. In fact she is an excellent granular surface no evidence of slough covering and I do not see any reason for likely debridement today. Especially in regard to the posterior leg. 01/26/17 on evaluation today patient appears to be doing decently well in regard to her wounds. The surface of the majority of her wounds is greatly improved. She has been using the Juxta-Lite compression which is excellent. Nonetheless the wound of the left posterior calf did require some debridement today otherwise the majority of the wounds did not require any debridement. The this was due to slough buildup on the surface. She also has a small skin flap where two of the ulcers actually connected and this has loosened up I'm concerned that things may  not heal well with that flap but for the time being I'm not gonna do anything different in that regard. 02/23/17 on evaluation today patient's wound actually appears to be doing fairly well in regard to the remaining left lateral ulcer which we have been treated. With that being said it does appear that her wrap actually slid down the wood bit over the past few days at least that there's actually some friction injury and blistering noted on the medial aspect of her malleolus of the left lower extremity. She continues to use the Juxta- Lite for the right lower extremity. With that being said no debridement is necessary today and these are very superficial injuries as far as the new injuries are concerned. 03/02/17 on evaluation today patient appears to be doing excellent in regard to her left lateral lower from the ulcers. In fact it appears that she is almost completely healed in regard to the ulcer we have been following and the new injuries which were caused by the wraps  slipping down last week have been completely resolved which is excellent news. Overall I'm definitely pleased with the progress that she has made. Fortunately the rat did not cause any irritation as it did previously with the Unna wrap. 03/09/17 on evaluation today patient's wound appears to be completely healed. Obviously this is great news. She and her husband both are extremely pleased and excited to finally have this gone. Obviously she has been dealing with this wound for quite some time. Readmission: ALEXINA, NICCOLI ([id number]) 04/14/2019 upon evaluation today patient appears for reevaluation here in our clinic although it has been since March 2019 since I last saw her. She unfortunately is having issues similar to what she had previous which are blisters and ulcerations of the bilateral lower extremities. Fortunately there is no evidence of active infection at this time based on what I am seeing. Unfortunately she is still having  a lot of drainage and weeping. I do believe she needs to be compression wrap in order to get this under control and she is previously tolerated a 3 layer compression wrap without any complications whatsoever. No fevers, chills, nausea, vomiting, or diarrhea. 04/28/2019 upon evaluation today patient appears to be doing well with regard to the bilateral lower extremities at this time. She has been tolerating the dressing changes without complication. Fortunately there is no signs of active infection at this time. No fevers, chills, nausea, vomiting, or diarrhea. 05/05/2019 upon evaluation today patient appears to be doing excellent in regard to her legs. She has a lot of new skin growth and overall seems to be progressing quite nicely. Fortunately there is no signs of active infection at this time. No fevers, chills, nausea, vomiting, or diarrhea. 05/12/2019 upon evaluation today patient appears to be doing very well with regard to her lower extremities. She is not completely healed at this point but nonetheless he is doing much better even compared to last week. I am very pleased with where things stand. No fevers, chills, nausea, vomiting, or diarrhea. 05/26/2019 upon evaluation today patient actually appears to be doing worse with regard to her lower extremities. She has green drainage unfortunately is having more pain on the right lower extremity as well. Fortunately there is no signs of active infection systemically which is good news. 06/03/2019 upon evaluation today patient appears to be doing about the same in regard to the fact that she still has wounds on both lower extremities although all wounds are measuring better and appearing much better. She finally did start taking the medication which has made a big difference for her that is the Levaquin. Unfortunately this appears to be making her urinate much more frequently. She has been taking this in the evening therefore it is keeping her up at  night. They are wondering if they can switch to taking this during the day. I would actually never instructed that they had to take this at night and I think it is definitely fine for her to take it in the morning. She will start that tomorrow. 06/10/2019 upon evaluation today patient appears to be doing excellent in regard to her lower extremities currently. The right lower extremity actually appears to be pretty much healed based on what I am seeing I do not see anything open or draining. The left lower extremity though not completely healed does appear to be doing much better which is great news. Overall there is no signs of active infection at this time. 06/17/2019 upon evaluation today patient appears to  be doing excellent in regard to her ulcers. She has been tolerating the dressing changes without complication in fact everything appears to be almost completely healed today. There is one area on the left lower extremity which appears to potentially still be weeping slightly and I think that we do need to see about wrapping her 1 more week just to make sure everything tightens up before discharge. 06/24/2019 upon evaluation today patient appears to be doing excellent in regard to her bilateral lower extremities. There does not appear to be any evidence of active infection which is great news and overall I am extremely pleased with the fact that again she appears to be ready for discharge. Readmission: 12/11/2019 patient is here for readmission today concerning her bilateral lower extremities. We have not seen her since June 2021. With that being said for the past 2 months apparently she has been having issues with her legs. It was noted by her husband that unfortunately he attempted to call to get an appointment with [place] but was not able to get anything in a sufficient amount of time to get her in sooner due to the fact that again we were having issues with staffing and scheduling. Therefore it was  delayed getting him into the wound care center for his wife. Nonetheless she appears to have evidence of cellulitis today of the bilateral lower extremities. Unfortunately she does not seem to be doing too well she is having a tremendous amount of pain and is asking for pain medication. Obviously it does look like her legs hurt although her wounds appear to be very superficial I am more concerned about the cellulitis aspect of this as opposed to the actual wound openings. 01/06/2020 upon evaluation today patient's legs actually appear to be doing significantly better as compared to last time I saw her when we were seeing her back for the first evaluation and actually had to send her to the ER for further evaluation. She was admitted to the hospital and was actually there from 12/12/2019 through 12/15/2019. With that being said the patient was placed on IV antibiotics initially, Ancef and IV fluids. Subsequently upon discharge she was given Keflex. With that being said she is getting need an extension of the Keflex as she was only given 3 days in 1 to make sure this completely resolves and does not cause her any additional issues or concerns here. I will send that in for them today. 01/13/2020 upon evaluation today patient appears to be doing decently well in regard to her legs. The areas where she had wounds previously have actually improved which is great news. There does not appear to be any signs of active infection at this time which is great news. With IV NSAIDs she did have some itching on the top of her foot that prompted them to remove the dressing/compression wrap last week because she was just absolutely miserable over the weekend. Nonetheless I do think that we can do some things to try to help out here there may be a little bit of a fungal and for. We are going to treat that accordingly. 01/20/2020 upon evaluation today patient actually appears to be doing well with regard to her legs. In  general I am very pleased with how things seem to be progressing. I do not see any signs of active infection at this time which is great news. Overall I think the patient is pleased as well she is not really having any pain and she tells  me that itching is greatly improved 01/27/2020 upon evaluation today patient appears to be doing excellent in regard to her leg ulcers that were previously present. They have healed and seem to be doing great I see no signs of infection at this time. No fevers, chills, nausea, vomiting, or diarrhea. With that being said I do believe that the patient is making good progress but still seems to be very fluid overloaded. We discussed the possibility that she may need to be back on the Lasix she was taken off this at the hospital but nonetheless I think she needs to have something on board to help with her edema. Otherwise I think she is going to continue to have significant issues with new blisters forming as we see this week 02/03/2020 upon evaluation today patient appears to be doing decently well in regard to her legs. Fortunately there is no signs of active infection at this time which is great news. She does have a very slight issue going on with a couple blisters that actually have not ruptured and seem to be drying up that is good news overall I do not see any signs of active infection at this time also good news. 02/10/2020 upon evaluation today patient appears to be doing well with regard to her leg ulcers. In fact she is healing quite nicely and only has a small blister on the right leg with a small opening still remaining on the posterior aspect of her right leg. Other than these 2 areas the left leg appears to be completely healed as is everything else on the right. In general I feel like that the compression has done well and she is making good progress we just have not quite gotten to the point of everything being resolved as of yet. Fortunately patient shows no  signs of active [place], Sumayah B. ([id number]) infection at this time. I am very pleased Objective Constitutional Well-nourished and well-hydrated in no acute distress. Vitals Time Taken: 12:40 PM, Height: 64 in, Weight: 205 lbs, BMI: 35.2, Temperature: 97.5 F, Pulse: 58 bpm, Respiratory Rate: 20 breaths/min, Blood Pressure: 167/83 mmHg. Respiratory normal breathing without difficulty. Psychiatric this patient is able to make decisions and demonstrates good insight into disease process. Alert and Oriented x 3. pleasant and cooperative. General Notes: Upon inspection patient's wound bed actually showed signs of good granulation at this time. There does not appear to be any evidence of active infection which is great news and overall I am extremely pleased with where things stand. I do not believe there is any evidence of anything worsening and definitely I think the infection is under good control we will continue to keep an eye on things closely. Integumentary (Hair, Skin) Wound #24 status is Healed - Epithelialized. Original cause of wound was Gradually Appeared. The wound is located on the Left,Proximal,Lateral Lower Leg. The wound measures 0cm length x 0cm width x 0cm depth; 0cm^2 area and 0cm^3 volume. There is no tunneling or undermining noted. There is a none present amount of drainage noted. There is no granulation within the wound bed. There is no necrotic tissue within the wound bed. Wound #26 status is Open. Original cause of wound was Gradually Appeared. The wound is located on the Right,Posterior Lower Leg. The wound measures 1cm length x 1cm width x 0.1cm depth; 0.785cm^2 area and 0.079cm^3 volume. There is Fat Layer (Subcutaneous Tissue) exposed. There is no tunneling or undermining noted. There is a medium amount of serous drainage noted. There is  large (67-100%) red granulation within the wound bed. There is a small (1-33%) amount of necrotic tissue within the wound bed  including Adherent Slough. Assessment Active Problems ICD-10 Lymphedema, not elsewhere classified Venous insufficiency (chronic) (peripheral) Cellulitis of right lower limb Cellulitis of left lower limb Procedures Wound #26 Pre-procedure diagnosis of Wound #26 is a Lymphedema located on the Right,Posterior Lower Leg . There was a Three Layer Compression Therapy Procedure by Dolan Amen, RN. Post procedure Diagnosis Wound #26: Same as Pre-Procedure Notes: pt tolerating wrap well. There was a Three Layer Compression Therapy Procedure by Dolan Amen, RN. Post procedure Diagnosis Wound #: Same as Pre-Procedure Notes: pt tolerating wrap well. ABENA, ERDMAN ([id number]) Plan Follow-up Appointments: Return Appointment in 1 week. Nurse Visit as needed Bathing/ Shower/ Hygiene: May shower with wound dressing protected with water repellent cover or cast protector. Edema Control - Lymphedema / Segmental Compressive Device / Other: Optional: One layer of unna paste to top of compression wrap (to act as an anchor). 3 Layer Compression System for Lymphedema. Elevate legs to the level of the heart and pump ankles as often as possible Elevate leg(s) parallel to the floor when sitting. WOUND #26: - Lower Leg Wound Laterality: Right, Posterior Cleanser: Normal Saline (Generic) 1 x Per Week/30 Days Discharge Instructions: Wash your hands with soap and water. Remove old dressing, discard into plastic bag and place into trash. Cleanse the wound with Normal Saline prior to applying a clean dressing using gauze sponges, not tissues or cotton balls. Do not scrub or use excessive force. Pat dry using gauze sponges, not tissue or cotton balls. Cleanser: Soap and Water 1 x Per Week/30 Days Discharge Instructions: Gently cleanse wound with antibacterial soap, rinse and pat dry prior to dressing wounds Peri-Wound Care: Moisturizing Lotion (Generic) 1 x Per Week/30 Days Discharge Instructions:  Suggestions: Theraderm, Eucerin, Cetaphil, or patient preference. Peri-Wound Care: Triamcinolone Acetonide Cream, 0.1%, 15 (g) tube (Generic) 1 x Per Week/30 Days Primary Dressing: Silvercel Small 2x2 (in/in) (Generic) 1 x Per Week/30 Days Discharge Instructions: Apply Silvercel Small 2x2 (in/in) as instructed Secondary Dressing: ABD Pad 5x9 (in/in) (Generic) 1 x Per Week/30 Days Discharge Instructions: Cover with ABD pad Compression Wrap: Profore Lite LF 3 Multilayer Compression Bandaging System (Generic) 1 x Per Week/30 Days Discharge Instructions: Apply 3 multi-layer wrap as prescribed. Compression Wrap: Unna w/Calamine, 4x10 (in/yd) (Generic) 1 x Per Week/30 Days Discharge Instructions: Apply unna paste -3 finger-widths below knee to anchor compression wrap in place. 1. Would recommend currently that we going to continue with the wound care measures as before and the patient is in agreement the plan. Includes the use of the silver alginate dressing to the open wound on the posterior aspect of her right leg and then subsequently were also get a continue with a 3 layer compression wrap. We will use an ABD pad over the blister which is not ruptured as of yet. 2. I am also can recommend she continue to elevate her legs much as possible try to keep edema under good control. We will see patient back for reevaluation in 1 week here in the clinic. If anything worsens or changes patient will contact our office for additional recommendations. Electronic Signature(s) Signed: 02/10/2020 1:30:01 PM By: Worthy Keeler PA-C Entered By: Worthy Keeler on 02/10/2020 13:30:01 [name] ([id number]) -------------------------------------------------------------------------------- SuperBill Details Patient Name: [name] Date of Service: 02/10/2020 Medical Record Number: [id number] Patient Account Number: [id number] Date of Birth/Sex: 28-Nov-1945 (75 y.o. Female)  Treating RN: Dolan Amen Primary  Care Provider: Lelon Huh Other Clinician: Referring Provider: Lelon Huh Treating Provider/Extender: Skipper Cliche in Treatment: 8 Diagnosis Coding ICD-10 Codes Code Description I89.0 Lymphedema, not elsewhere classified I87.2 Venous insufficiency (chronic) (peripheral) L03.115 Cellulitis of right lower limb L03.116 Cellulitis of left lower limb Facility Procedures CPT4: Description Modifier Quantity Code [id number] BILATERAL: Application of multi-layer venous compression system; leg (below knee), including 1 ankle and foot. Physician Procedures CPT4 Code: 4462863 Description: 81771 - WC PHYS LEVEL 3 - EST PT Modifier: Quantity: 1 CPT4 Code: Description: ICD-10 Diagnosis Description I89.0 Lymphedema, not elsewhere classified I87.2 Venous insufficiency (chronic) (peripheral) L03.115 Cellulitis of right lower limb L03.116 Cellulitis of left lower limb Modifier: Quantity: Electronic Signature(s) Signed: 02/10/2020 1:30:21 PM By: Worthy Keeler PA-C Entered By: Worthy Keeler on 02/10/2020 13:30:21

## 2020-02-16 ENCOUNTER — Other Ambulatory Visit: Payer: Self-pay

## 2020-02-16 ENCOUNTER — Ambulatory Visit (INDEPENDENT_AMBULATORY_CARE_PROVIDER_SITE_OTHER): Payer: Medicare Other | Admitting: Family Medicine

## 2020-02-16 VITALS — BP 152/74 | HR 58 | Temp 97.0°F

## 2020-02-16 DIAGNOSIS — N1832 Chronic kidney disease, stage 3b: Secondary | ICD-10-CM

## 2020-02-16 DIAGNOSIS — E2839 Other primary ovarian failure: Secondary | ICD-10-CM

## 2020-02-16 DIAGNOSIS — E1121 Type 2 diabetes mellitus with diabetic nephropathy: Secondary | ICD-10-CM

## 2020-02-16 DIAGNOSIS — I10 Essential (primary) hypertension: Secondary | ICD-10-CM | POA: Diagnosis not present

## 2020-02-16 DIAGNOSIS — D649 Anemia, unspecified: Secondary | ICD-10-CM

## 2020-02-16 LAB — POCT GLYCOSYLATED HEMOGLOBIN (HGB A1C)
Est. average glucose Bld gHb Est-mCnc: 157
Hemoglobin A1C: 7.1 % — AB (ref 4.0–5.6)

## 2020-02-16 NOTE — Progress Notes (Addendum)
Established patient visit   Patient: [name]   DOB: [birth year]   75 y.o. Female  MRN: [id number] Visit Date: 02/16/2020  Today's healthcare provider: Lelon Huh, MD   No chief complaint on file.  Subjective    HPI  Diabetes Mellitus Type II, Follow-up  Lab Results  Component Value Date   HGBA1C 7.4 (H) 12/11/2019   HGBA1C 7.4 (A) 09/30/2019   HGBA1C 6.9 (A) 06/30/2019   Wt Readings from Last 3 Encounters:  01/28/20 206 lb (93.4 kg)  12/11/19 200 lb (90.7 kg)  10/06/19 200 lb (90.7 kg)   Last seen for diabetes 4 months ago.  Management since then includes advising patient to work on increasing activity and reducing calories, Continue current medications for now. Recheck a1c in 4 months. She reports excellent compliance with treatment. She is not having side effects.  Symptoms: No fatigue No foot ulcerations  No appetite changes No nausea  No paresthesia of the feet  No polydipsia  No polyuria No visual disturbances   No vomiting     Home blood sugar records: N/A  Episodes of hypoglycemia? No    Current insulin regiment: none Most Recent Eye Exam: 10/08/2019 Current exercise: none Current diet habits: in general, a "healthy" diet    Pertinent Labs: Lab Results  Component Value Date   CHOL 164 09/30/2019   HDL 41 09/30/2019   LDLCALC 98 09/30/2019   TRIG 140 09/30/2019   CHOLHDL 4.0 09/30/2019   Lab Results  Component Value Date   NA 140 01/28/2020   K 4.4 01/28/2020   CREATININE 1.88 (H) 01/28/2020   GFRNONAA 26 (L) 01/28/2020   GFRAA 30 (L) 01/28/2020   GLUCOSE 133 (H) 01/28/2020     ---------------------------------------------------------------------------------------------------  Follow up for edema:  The patient was last seen for this 4 months ago. Changes made at last visit include none.  Strongly counseled on importance of mechanical treatments including elevation of LEs and compression stockings, and taking furosemide  consistently due to CHF history.  She reports fair compliance with treatment. She feels that condition is Improved. She is having side effects. Pt was having excessive urination. She had tried changing furosemide to 32m three days a week, but had severe urinary incontinence with excessive urinary output and frequency when she last took it 3 days ago and does not want to take it anymore. She also stopped taking the amlodipine and metoprolol the next couple of days.  She has been off lisinopril 227mthe several months since, by her husbands report,  her BP was low and kidney functions had declined,with creatinine up to 2.25 in December and potassium was 5.4  -----------------------------------------------------------------------------------------      Medications: Outpatient Medications Prior to Visit  Medication Sig  . albuterol (VENTOLIN HFA) 108 (90 Base) MCG/ACT inhaler INHALE 1-2 PUFFS INTO THE LUNGS EVERY 6 (SIX) HOURS AS NEEDED FOR SHORTNESS OF BREATH.  . Marland KitchenmLODipine (NORVASC) 5 MG tablet Take 1 tablet (5 mg total) by mouth daily.  . Cholecalciferol (VITAMIN D) 50 MCG (2000 UT) tablet Take 2,000 Units by mouth daily.  . cyanocobalamin 500 MCG tablet Take 250 mcg by mouth daily.   . furosemide (LASIX) 40 MG tablet Take 1 tablet (40 mg total) by mouth every Monday, Wednesday, and Friday.  . Marland KitchenYDROcodone-acetaminophen (NORCO) 10-325 MG tablet Take 1 tablet by mouth every 6 (six) hours as needed.  . levalbuterol (XOPENEX) 1.25 MG/3ML nebulizer solution Take 1.25 mg by nebulization every 8 (eight)  hours as needed for wheezing.  . metoprolol tartrate (LOPRESSOR) 50 MG tablet Take 1.5 tablets (75 mg total) by mouth 2 (two) times daily.  Marland Kitchen oxybutynin (DITROPAN) 5 MG tablet Take 1 tablet (5 mg total) by mouth 2 (two) times daily.   No facility-administered medications prior to visit.    Review of Systems     Objective    BP (!) 152/74 (BP Location: Right Arm, Patient Position: Sitting,  Cuff Size: Large)   Pulse (!) 58   Temp (!) 97 F (36.1 C) (Temporal)   SpO2 100%     Physical Exam    General: Appearance:    Obese female in no acute distress  Eyes:    PERRL, conjunctiva/corneas clear, EOM's intact       Lungs:     Clear to auscultation bilaterally, respirations unlabored  Heart:    Bradycardic. Normal rhythm.  2/6 harsh, crescendo-decrescendo, systolic murmur at right upper sternal border  MS:   All extremities are intact. 3+ bipedal edema.   Neurologic:   Awake, alert, oriented x 3. No apparent focal neurological           defect.        Results for orders placed or performed in visit on 02/16/20  POCT HgB A1C  Result Value Ref Range   Hemoglobin A1C 7.1 (A) 4.0 - 5.6 %   Est. average glucose Bld gHb Est-mCnc 157     Assessment & Plan     1. Diabetes mellitus with nephropathy (HCC) a1c is stable. Will monitor a1c q4-6 months.   2. Stage 3b chronic kidney disease ([place]) Now off of spironolactone and lisinopril. ACEI would likely be beneficial for kidney protection, but will need to see how potassium looks first. She was previously taking 46m of lisinopril. Will consider restarting low dose of 522mif potassium is reasonable. She is very reluctant to see more doctors so will defer nephrology referral if her eGFR remains stable.   3. Essential hypertension Consider starting back on low dose ACE.  - Basic metabolic panel - CBC  4. Anemia, unspecified type   5. Estrogen deficiency  - DG Bone density [place]; Future    She had excessive urination incontinence, leakage with 4014murosemide which she is only taking 3 times a week. Advised she can reduce to 85m31m/2 tablet) at a time but will likely need to take more frequently depending on leg swelling.  She also reports she did have asthma attack yesterday which cleared up with albuterl nebulizer. She has been off of maintenance inhalers for a long period of time, but will need to restart if she has to  use her nebulizer more than once week.     The entirety of the information documented in the History of Present Illness, Review of Systems and Physical Exam were personally obtained by me. Portions of this information were initially documented by the CMA and reviewed by me for thoroughness and accuracy.      DonaLelon Huh  [facility]-[phone]) [phone])  [facility]

## 2020-02-17 ENCOUNTER — Other Ambulatory Visit: Payer: Self-pay

## 2020-02-17 ENCOUNTER — Telehealth: Payer: Self-pay

## 2020-02-17 ENCOUNTER — Encounter: Payer: Medicare Other | Admitting: Physician Assistant

## 2020-02-17 DIAGNOSIS — I10 Essential (primary) hypertension: Secondary | ICD-10-CM | POA: Diagnosis not present

## 2020-02-17 DIAGNOSIS — L97218 Non-pressure chronic ulcer of right calf with other specified severity: Secondary | ICD-10-CM | POA: Diagnosis not present

## 2020-02-17 DIAGNOSIS — L03115 Cellulitis of right lower limb: Secondary | ICD-10-CM | POA: Diagnosis not present

## 2020-02-17 DIAGNOSIS — I890 Lymphedema, not elsewhere classified: Secondary | ICD-10-CM | POA: Diagnosis not present

## 2020-02-17 DIAGNOSIS — I2510 Atherosclerotic heart disease of native coronary artery without angina pectoris: Secondary | ICD-10-CM | POA: Diagnosis not present

## 2020-02-17 DIAGNOSIS — L03116 Cellulitis of left lower limb: Secondary | ICD-10-CM | POA: Diagnosis not present

## 2020-02-17 DIAGNOSIS — E119 Type 2 diabetes mellitus without complications: Secondary | ICD-10-CM | POA: Diagnosis not present

## 2020-02-17 DIAGNOSIS — I428 Other cardiomyopathies: Secondary | ICD-10-CM | POA: Diagnosis not present

## 2020-02-17 DIAGNOSIS — I872 Venous insufficiency (chronic) (peripheral): Secondary | ICD-10-CM | POA: Diagnosis not present

## 2020-02-17 DIAGNOSIS — I272 Pulmonary hypertension, unspecified: Secondary | ICD-10-CM | POA: Diagnosis not present

## 2020-02-17 LAB — BASIC METABOLIC PANEL
BUN/Creatinine Ratio: 19 (ref 12–28)
BUN: 31 mg/dL — ABNORMAL HIGH (ref 8–27)
CO2: 26 mmol/L (ref 20–29)
Calcium: 9.5 mg/dL (ref 8.7–10.3)
Chloride: 101 mmol/L (ref 96–106)
Creatinine, Ser: 1.63 mg/dL — ABNORMAL HIGH (ref 0.57–1.00)
GFR calc Af Amer: 35 mL/min/{1.73_m2} — ABNORMAL LOW (ref >59)
GFR calc non Af Amer: 31 mL/min/{1.73_m2} — ABNORMAL LOW (ref >59)
Glucose: 143 mg/dL — ABNORMAL HIGH (ref 65–99)
Potassium: 4.2 mmol/L (ref 3.5–5.2)
Sodium: 140 mmol/L (ref 134–144)

## 2020-02-17 LAB — CBC
Hematocrit: 32 % — ABNORMAL LOW (ref 34.0–46.6)
Hemoglobin: 9.7 g/dL — ABNORMAL LOW (ref 11.1–15.9)
MCH: 27.2 pg (ref 26.6–33.0)
MCHC: 30.3 g/dL — ABNORMAL LOW (ref 31.5–35.7)
MCV: 90 fL (ref 79–97)
Platelets: 265 10*3/uL (ref 150–450)
RBC: 3.57 x10E6/uL — ABNORMAL LOW (ref 3.77–5.28)
RDW: 14.4 % (ref 11.7–15.4)
WBC: 7.1 10*3/uL (ref 3.4–10.8)

## 2020-02-17 MED ORDER — LISINOPRIL 5 MG PO TABS: 5.0000 mg | ORAL_TABLET | Freq: Every day | ORAL | 3 refills | Status: DC

## 2020-02-17 NOTE — Telephone Encounter (Signed)
Pt called and husband answered. Pt's husband verbalized understanding of information below. 3 mo HTN appt has been scheduled on 05/24/20 and pt has agreed to start lisinopril. Order has been sent.

## 2020-02-17 NOTE — Telephone Encounter (Signed)
-----   Message from Birdie Sons, MD sent at 02/17/2020  7:57 AM EST ----- Kidney functions are a little better, potassium levels are good. Recommend she start lisinopril 5mg  one tablet daily, #30,rf x 2 and follow up in 3 months. (she will need labs at follow up appt, so needs to be before 11am or 4pm)

## 2020-02-17 NOTE — Addendum Note (Signed)
Addended by: Sylvester Harder on: 02/17/2020 09:44 AM   Modules accepted: Orders

## 2020-02-17 NOTE — Progress Notes (Addendum)
CHLOE, FLIS ([id number]) Visit Report for 02/17/2020 Chief Complaint Document Details Patient Name: [name], [name]. Date of Service: 02/17/2020 12:45 PM Medical Record Number: [id number] Patient Account Number: [id number] Date of Birth/Sex: 04-Apr-1945 (75 y.o. F) Treating RN: Dolan Amen Primary Care Provider: Lelon Huh Other Clinician: Referring Provider: Lelon Huh Treating Provider/Extender: Skipper Cliche in Treatment: 9 Information Obtained from: Patient Chief Complaint Patient seen for complaints of Non-Healing Wounds to both lower extremities Electronic Signature(s) Signed: 02/17/2020 1:11:41 PM By: Worthy Keeler PA-C Entered By: Worthy Keeler on 02/17/2020 13:11:41 [name] ([id number]) -------------------------------------------------------------------------------- HPI Details Patient Name: [name] Date of Service: 02/17/2020 12:45 PM Medical Record Number: [id number] Patient Account Number: [id number] Date of Birth/Sex: 1945-02-18 (75 y.o. F) Treating RN: Dolan Amen Primary Care Provider: Lelon Huh Other Clinician: Referring Provider: Lelon Huh Treating Provider/Extender: Skipper Cliche in Treatment: 9 History of Present Illness Location: both lower extremity swelling with ulceration HPI Description: 75 year old patient who sees her PCP Dr. Lelon Huh was recently evaluated 10 days ago for diabetes mellitus, hypertension, CHF and hyperlipidemia. she also was noted to have ulcerations develop in her legs and she has been applying Silvadene dressings locally. In the past she has refused wound care referrals.her cardiologist Dr. Saunders Revel saw her and put her on 40 mg of furosemide daily. last hemoglobin A1c was 7.7%. she was also placed on ciprofloxacin twice daily for 7 days and a urine culture was recommended. past medical history significant for coronary artery disease, diabetes mellitus, nonischemic cardiomyopathy and pulmonary  hypertension. She is also status post heart catheterization and coronary angiography, tubal ligation and breast cyst removal in the past. She has never been a smoker. Patient had arterial studies done which showed bilateral ABIs are artificially elevated due to noncompressible and calcified vessels. Triphasic waveform throughout. Right great toe TBI is elevated while the left is normal. 10/23/2016 -- the patient is rather moribund from several issues including chronic back pain and knee pain and swelling of her legs. The large necrotic area on her left lateral anterior calf was bleeding on touch after washing her leg. There was a spot which needed silver nitrate cauterization and this was done appropriately. 11/06/2016 -- she is again noted to have friable bleeding from the left lower extremity wounds and this again had to be cauterized with silver nitrate to control the bleeding as pressure itself would not do it. 11/20/2016 -- the right lower extremity is completely healed and we have ordered 30-40 mm compression stocking's in both the dural layer and also a pair of juxta lites. 12/18/2016 -- she has not been wearing her compression stockings on her right leg and these have opened out into ulcerations again. Her left lower extremity has circumferential ulcerations now. I believe at this stage I would like to get her venous reflux studies done to make certain that there is no fixable superficial venous reflux. 12/29/2016 -- she has made an excellent recovery having continued with her appropriate doses of diuretics and elevation and exercise. She has not yet received her right lower extremity juxta lites. Her venous duplex study is at the end of January 01/13/16 on evaluation today patient's wounds appeared to be overall doing much better much less hyper granular than previous week's evaluation. The Hydrofera Blue Dressing's to be doing very well. She does tell me that she is having a little bit more  discomfort in the posterior aspect of her leg where she has a wound at this  point. Fortunately there appears to be no infection. 01/19/17 on evaluation today patient appears to be doing very well in regard to her bilateral lower extremity swelling and at this point in time her left lower extremity ulcers. Her wounds appear to be doing much better. She has been tolerating the dressing's at this point fortunately she did get the Juxta-Lite compression as of today as well. Nonetheless I am pleased that she has been tolerating everything so well and that her wounds looks so good. In fact she is an excellent granular surface no evidence of slough covering and I do not see any reason for likely debridement today. Especially in regard to the posterior leg. 01/26/17 on evaluation today patient appears to be doing decently well in regard to her wounds. The surface of the majority of her wounds is greatly improved. She has been using the Juxta-Lite compression which is excellent. Nonetheless the wound of the left posterior calf did require some debridement today otherwise the majority of the wounds did not require any debridement. The this was due to slough buildup on the surface. She also has a small skin flap where two of the ulcers actually connected and this has loosened up I'm concerned that things may not heal well with that flap but for the time being I'm not gonna do anything different in that regard. 02/23/17 on evaluation today patient's wound actually appears to be doing fairly well in regard to the remaining left lateral ulcer which we have been treated. With that being said it does appear that her wrap actually slid down the wood bit over the past few days at least that there's actually some friction injury and blistering noted on the medial aspect of her malleolus of the left lower extremity. She continues to use the Juxta- Lite for the right lower extremity. With that being said no debridement is  necessary today and these are very superficial injuries as far as the new injuries are concerned. 03/02/17 on evaluation today patient appears to be doing excellent in regard to her left lateral lower from the ulcers. In fact it appears that she is almost completely healed in regard to the ulcer we have been following and the new injuries which were caused by the wraps slipping down last week have been completely resolved which is excellent news. Overall I'm definitely pleased with the progress that she has made. Fortunately the rat did not cause any irritation as it did previously with the Unna wrap. 03/09/17 on evaluation today patient's wound appears to be completely healed. Obviously this is great news. She and her husband both are extremely pleased and excited to finally have this gone. Obviously she has been dealing with this wound for quite some time. Readmission: 04/14/2019 upon evaluation today patient appears for reevaluation here in our clinic although it has been since March 2019 since I last saw her. She unfortunately is having issues similar to what she had previous which are blisters and ulcerations of the bilateral lower extremities. Fortunately there is no evidence of active infection at this time based on what I am seeing. Unfortunately she is still having a lot of drainage and weeping. I do believe she needs to be compression wrap in order to get this under control and she is previously tolerated a 3 layer compression wrap without any complications whatsoever. No fevers, chills, nausea, vomiting, or diarrhea. DAY, GREB ([id number]) 04/28/2019 upon evaluation today patient appears to be doing well with regard to the bilateral lower  extremities at this time. She has been tolerating the dressing changes without complication. Fortunately there is no signs of active infection at this time. No fevers, chills, nausea, vomiting, or diarrhea. 05/05/2019 upon evaluation today patient appears  to be doing excellent in regard to her legs. She has a lot of new skin growth and overall seems to be progressing quite nicely. Fortunately there is no signs of active infection at this time. No fevers, chills, nausea, vomiting, or diarrhea. 05/12/2019 upon evaluation today patient appears to be doing very well with regard to her lower extremities. She is not completely healed at this point but nonetheless he is doing much better even compared to last week. I am very pleased with where things stand. No fevers, chills, nausea, vomiting, or diarrhea. 05/26/2019 upon evaluation today patient actually appears to be doing worse with regard to her lower extremities. She has green drainage unfortunately is having more pain on the right lower extremity as well. Fortunately there is no signs of active infection systemically which is good news. 06/03/2019 upon evaluation today patient appears to be doing about the same in regard to the fact that she still has wounds on both lower extremities although all wounds are measuring better and appearing much better. She finally did start taking the medication which has made a big difference for her that is the Levaquin. Unfortunately this appears to be making her urinate much more frequently. She has been taking this in the evening therefore it is keeping her up at night. They are wondering if they can switch to taking this during the day. I would actually never instructed that they had to take this at night and I think it is definitely fine for her to take it in the morning. She will start that tomorrow. 06/10/2019 upon evaluation today patient appears to be doing excellent in regard to her lower extremities currently. The right lower extremity actually appears to be pretty much healed based on what I am seeing I do not see anything open or draining. The left lower extremity though not completely healed does appear to be doing much better which is great news. Overall there  is no signs of active infection at this time. 06/17/2019 upon evaluation today patient appears to be doing excellent in regard to her ulcers. She has been tolerating the dressing changes without complication in fact everything appears to be almost completely healed today. There is one area on the left lower extremity which appears to potentially still be weeping slightly and I think that we do need to see about wrapping her 1 more week just to make sure everything tightens up before discharge. 06/24/2019 upon evaluation today patient appears to be doing excellent in regard to her bilateral lower extremities. There does not appear to be any evidence of active infection which is great news and overall I am extremely pleased with the fact that again she appears to be ready for discharge. Readmission: 12/11/2019 patient is here for readmission today concerning her bilateral lower extremities. We have not seen her since June 2021. With that being said for the past 2 months apparently she has been having issues with her legs. It was noted by her husband that unfortunately he attempted to call to get an appointment with [place] but was not able to get anything in a sufficient amount of time to get her in sooner due to the fact that again we were having issues with staffing and scheduling. Therefore it was delayed getting him  into the wound care center for his wife. Nonetheless she appears to have evidence of cellulitis today of the bilateral lower extremities. Unfortunately she does not seem to be doing too well she is having a tremendous amount of pain and is asking for pain medication. Obviously it does look like her legs hurt although her wounds appear to be very superficial I am more concerned about the cellulitis aspect of this as opposed to the actual wound openings. 01/06/2020 upon evaluation today patient's legs actually appear to be doing significantly better as compared to last time I saw her when we  were seeing her back for the first evaluation and actually had to send her to the ER for further evaluation. She was admitted to the hospital and was actually there from 12/12/2019 through 12/15/2019. With that being said the patient was placed on IV antibiotics initially, Ancef and IV fluids. Subsequently upon discharge she was given Keflex. With that being said she is getting need an extension of the Keflex as she was only given 3 days in 1 to make sure this completely resolves and does not cause her any additional issues or concerns here. I will send that in for them today. 01/13/2020 upon evaluation today patient appears to be doing decently well in regard to her legs. The areas where she had wounds previously have actually improved which is great news. There does not appear to be any signs of active infection at this time which is great news. With IV NSAIDs she did have some itching on the top of her foot that prompted them to remove the dressing/compression wrap last week because she was just absolutely miserable over the weekend. Nonetheless I do think that we can do some things to try to help out here there may be a little bit of a fungal and for. We are going to treat that accordingly. 01/20/2020 upon evaluation today patient actually appears to be doing well with regard to her legs. In general I am very pleased with how things seem to be progressing. I do not see any signs of active infection at this time which is great news. Overall I think the patient is pleased as well she is not really having any pain and she tells me that itching is greatly improved 01/27/2020 upon evaluation today patient appears to be doing excellent in regard to her leg ulcers that were previously present. They have healed and seem to be doing great I see no signs of infection at this time. No fevers, chills, nausea, vomiting, or diarrhea. With that being said I do believe that the patient is making good progress but  still seems to be very fluid overloaded. We discussed the possibility that she may need to be back on the Lasix she was taken off this at the hospital but nonetheless I think she needs to have something on board to help with her edema. Otherwise I think she is going to continue to have significant issues with new blisters forming as we see this week 02/03/2020 upon evaluation today patient appears to be doing decently well in regard to her legs. Fortunately there is no signs of active infection at this time which is great news. She does have a very slight issue going on with a couple blisters that actually have not ruptured and seem to be drying up that is good news overall I do not see any signs of active infection at this time also good news. 02/10/2020 upon evaluation today patient appears  to be doing well with regard to her leg ulcers. In fact she is healing quite nicely and only has a small blister on the right leg with a small opening still remaining on the posterior aspect of her right leg. Other than these 2 areas the left leg appears to be completely healed as is everything else on the right. In general I feel like that the compression has done well and she is making good progress we just have not quite gotten to the point of everything being resolved as of yet. Fortunately patient shows no signs of active infection at this time. I am very pleased 02/17/2020 upon evaluation today patient appears to be doing well with regard to her legs. There is no sign of actually open wounds at this point which is great news. No fevers, chills, nausea, vomiting, or diarrhea. I am very pleased with where things stand at this point. There does not appear to be any evidence of active infection at this time. [name], [name] ([id number]) Electronic Signature(s) Signed: 02/17/2020 1:49:08 PM By: Worthy Keeler PA-C Entered By: Worthy Keeler on 02/17/2020 13:49:07 [name]  ([id number]) -------------------------------------------------------------------------------- Physical Exam Details Patient Name: [name], [name]. Date of Service: 02/17/2020 12:45 PM Medical Record Number: [id number] Patient Account Number: [id number] Date of Birth/Sex: 30-Jun-1945 (75 y.o. F) Treating RN: Dolan Amen Primary Care Provider: Lelon Huh Other Clinician: Referring Provider: Lelon Huh Treating Provider/Extender: Skipper Cliche in Treatment: 9 Constitutional Obese and well-hydrated in no acute distress. Respiratory normal breathing without difficulty. Psychiatric this patient is able to make decisions and demonstrates good insight into disease process. Alert and Oriented x 3. pleasant and cooperative. Notes Patient's wound bed showed signs of good granulation epithelization. There does not appear to be any evidence of overall worsening and in fact I think that she is completely healed based on what I see currently. I am very pleased with the fact that she has done so well and again I think we are ready for discharge she does have compression socks as well. Electronic Signature(s) Signed: 02/17/2020 1:49:30 PM By: Worthy Keeler PA-C Entered By: Worthy Keeler on 02/17/2020 13:49:30 [name] ([id number]) -------------------------------------------------------------------------------- Physician Orders Details Patient Name: [name] Date of Service: 02/17/2020 12:45 PM Medical Record Number: [id number] Patient Account Number: [id number] Date of Birth/Sex: August 10, 1945 (75 y.o. F) Treating RN: Dolan Amen Primary Care Provider: Lelon Huh Other Clinician: Referring Provider: Lelon Huh Treating Provider/Extender: Skipper Cliche in Treatment: 9 Verbal / Phone Orders: No Diagnosis Coding ICD-10 Coding Code Description I89.0 Lymphedema, not elsewhere classified I87.2 Venous insufficiency (chronic) (peripheral) L03.115 Cellulitis of  right lower limb L03.116 Cellulitis of left lower limb Discharge From [facility] o Discharge from [facility] o Wear compression garments daily. Put garments on first thing when you wake up and remove them before bed. o Moisturize legs daily after removing compression garments. Electronic Signature(s) Signed: 02/17/2020 5:14:05 PM By: Georges Mouse, Minus Breeding RN Signed: 02/19/2020 4:58:35 PM By: Worthy Keeler PA-C Entered By: Georges Mouse, Minus Breeding on 02/17/2020 13:13:53 [name] ([id number]) -------------------------------------------------------------------------------- Problem List Details Patient Name: SABRIEL, BORROMEO. Date of Service: 02/17/2020 12:45 PM Medical Record Number: [id number] Patient Account Number: [id number] Date of Birth/Sex: 09/29/1945 (75 y.o. F) Treating RN: Dolan Amen Primary Care Provider: Lelon Huh Other Clinician: Referring Provider: Lelon Huh Treating Provider/Extender: Skipper Cliche in Treatment: 9 Active Problems ICD-10 Encounter Code Description Active Date MDM Diagnosis I89.0 Lymphedema,  not elsewhere classified 12/11/2019 No Yes I87.2 Venous insufficiency (chronic) (peripheral) 12/11/2019 No Yes L03.115 Cellulitis of right lower limb 12/11/2019 No Yes L03.116 Cellulitis of left lower limb 12/11/2019 No Yes Inactive Problems Resolved Problems Electronic Signature(s) Signed: 02/17/2020 1:11:15 PM By: Worthy Keeler PA-C Entered By: Worthy Keeler on 02/17/2020 13:11:15 [name] ([id number]) -------------------------------------------------------------------------------- Progress Note Details Patient Name: Jillian Albe B. Date of Service: 02/17/2020 12:45 PM Medical Record Number: [id number] Patient Account Number: [id number] Date of Birth/Sex: 01/24/1945 (75 y.o. F) Treating RN: Dolan Amen Primary Care Provider: Lelon Huh Other Clinician: Referring Provider: Lelon Huh Treating  Provider/Extender: Skipper Cliche in Treatment: 9 Subjective Chief Complaint Information obtained from Patient Patient seen for complaints of Non-Healing Wounds to both lower extremities History of Present Illness (HPI) The following HPI elements were documented for the patient's wound: Location: both lower extremity swelling with ulceration 75 year old patient who sees her PCP Dr. Lelon Huh was recently evaluated 10 days ago for diabetes mellitus, hypertension, CHF and hyperlipidemia. she also was noted to have ulcerations develop in her legs and she has been applying Silvadene dressings locally. In the past she has refused wound care referrals.her cardiologist Dr. Saunders Revel saw her and put her on 40 mg of furosemide daily. last hemoglobin A1c was 7.7%. she was also placed on ciprofloxacin twice daily for 7 days and a urine culture was recommended. past medical history significant for coronary artery disease, diabetes mellitus, nonischemic cardiomyopathy and pulmonary hypertension. She is also status post heart catheterization and coronary angiography, tubal ligation and breast cyst removal in the past. She has never been a smoker. Patient had arterial studies done which showed bilateral ABIs are artificially elevated due to noncompressible and calcified vessels. Triphasic waveform throughout. Right great toe TBI is elevated while the left is normal. 10/23/2016 -- the patient is rather moribund from several issues including chronic back pain and knee pain and swelling of her legs. The large necrotic area on her left lateral anterior calf was bleeding on touch after washing her leg. There was a spot which needed silver nitrate cauterization and this was done appropriately. 11/06/2016 -- she is again noted to have friable bleeding from the left lower extremity wounds and this again had to be cauterized with silver nitrate to control the bleeding as pressure itself would not do it. 11/20/2016 --  the right lower extremity is completely healed and we have ordered 30-40 mm compression stocking's in both the dural layer and also a pair of juxta lites. 12/18/2016 -- she has not been wearing her compression stockings on her right leg and these have opened out into ulcerations again. Her left lower extremity has circumferential ulcerations now. I believe at this stage I would like to get her venous reflux studies done to make certain that there is no fixable superficial venous reflux. 12/29/2016 -- she has made an excellent recovery having continued with her appropriate doses of diuretics and elevation and exercise. She has not yet received her right lower extremity juxta lites. Her venous duplex study is at the end of January 01/13/16 on evaluation today patient's wounds appeared to be overall doing much better much less hyper granular than previous week's evaluation. The Hydrofera Blue Dressing's to be doing very well. She does tell me that she is having a little bit more discomfort in the posterior aspect of her leg where she has a wound at this point. Fortunately there appears to be no infection. 01/19/17 on evaluation today patient appears  to be doing very well in regard to her bilateral lower extremity swelling and at this point in time her left lower extremity ulcers. Her wounds appear to be doing much better. She has been tolerating the dressing's at this point fortunately she did get the Juxta-Lite compression as of today as well. Nonetheless I am pleased that she has been tolerating everything so well and that her wounds looks so good. In fact she is an excellent granular surface no evidence of slough covering and I do not see any reason for likely debridement today. Especially in regard to the posterior leg. 01/26/17 on evaluation today patient appears to be doing decently well in regard to her wounds. The surface of the majority of her wounds is greatly improved. She has been using the  Juxta-Lite compression which is excellent. Nonetheless the wound of the left posterior calf did require some debridement today otherwise the majority of the wounds did not require any debridement. The this was due to slough buildup on the surface. She also has a small skin flap where two of the ulcers actually connected and this has loosened up I'm concerned that things may not heal well with that flap but for the time being I'm not gonna do anything different in that regard. 02/23/17 on evaluation today patient's wound actually appears to be doing fairly well in regard to the remaining left lateral ulcer which we have been treated. With that being said it does appear that her wrap actually slid down the wood bit over the past few days at least that there's actually some friction injury and blistering noted on the medial aspect of her malleolus of the left lower extremity. She continues to use the Juxta- Lite for the right lower extremity. With that being said no debridement is necessary today and these are very superficial injuries as far as the new injuries are concerned. 03/02/17 on evaluation today patient appears to be doing excellent in regard to her left lateral lower from the ulcers. In fact it appears that she is almost completely healed in regard to the ulcer we have been following and the new injuries which were caused by the wraps slipping down last week have been completely resolved which is excellent news. Overall I'm definitely pleased with the progress that she has made. Fortunately the rat did not cause any irritation as it did previously with the Unna wrap. 03/09/17 on evaluation today patient's wound appears to be completely healed. Obviously this is great news. She and her husband both are extremely pleased and excited to finally have this gone. Obviously she has been dealing with this wound for quite some time. Readmission: [name], [name] ([id number]) 04/14/2019 upon evaluation today  patient appears for reevaluation here in our clinic although it has been since March 2019 since I last saw her. She unfortunately is having issues similar to what she had previous which are blisters and ulcerations of the bilateral lower extremities. Fortunately there is no evidence of active infection at this time based on what I am seeing. Unfortunately she is still having a lot of drainage and weeping. I do believe she needs to be compression wrap in order to get this under control and she is previously tolerated a 3 layer compression wrap without any complications whatsoever. No fevers, chills, nausea, vomiting, or diarrhea. 04/28/2019 upon evaluation today patient appears to be doing well with regard to the bilateral lower extremities at this time. She has been tolerating the dressing changes without complication.  Fortunately there is no signs of active infection at this time. No fevers, chills, nausea, vomiting, or diarrhea. 05/05/2019 upon evaluation today patient appears to be doing excellent in regard to her legs. She has a lot of new skin growth and overall seems to be progressing quite nicely. Fortunately there is no signs of active infection at this time. No fevers, chills, nausea, vomiting, or diarrhea. 05/12/2019 upon evaluation today patient appears to be doing very well with regard to her lower extremities. She is not completely healed at this point but nonetheless he is doing much better even compared to last week. I am very pleased with where things stand. No fevers, chills, nausea, vomiting, or diarrhea. 05/26/2019 upon evaluation today patient actually appears to be doing worse with regard to her lower extremities. She has green drainage unfortunately is having more pain on the right lower extremity as well. Fortunately there is no signs of active infection systemically which is good news. 06/03/2019 upon evaluation today patient appears to be doing about the same in regard to the fact  that she still has wounds on both lower extremities although all wounds are measuring better and appearing much better. She finally did start taking the medication which has made a big difference for her that is the Levaquin. Unfortunately this appears to be making her urinate much more frequently. She has been taking this in the evening therefore it is keeping her up at night. They are wondering if they can switch to taking this during the day. I would actually never instructed that they had to take this at night and I think it is definitely fine for her to take it in the morning. She will start that tomorrow. 06/10/2019 upon evaluation today patient appears to be doing excellent in regard to her lower extremities currently. The right lower extremity actually appears to be pretty much healed based on what I am seeing I do not see anything open or draining. The left lower extremity though not completely healed does appear to be doing much better which is great news. Overall there is no signs of active infection at this time. 06/17/2019 upon evaluation today patient appears to be doing excellent in regard to her ulcers. She has been tolerating the dressing changes without complication in fact everything appears to be almost completely healed today. There is one area on the left lower extremity which appears to potentially still be weeping slightly and I think that we do need to see about wrapping her 1 more week just to make sure everything tightens up before discharge. 06/24/2019 upon evaluation today patient appears to be doing excellent in regard to her bilateral lower extremities. There does not appear to be any evidence of active infection which is great news and overall I am extremely pleased with the fact that again she appears to be ready for discharge. Readmission: 12/11/2019 patient is here for readmission today concerning her bilateral lower extremities. We have not seen her since June 2021.  With that being said for the past 2 months apparently she has been having issues with her legs. It was noted by her husband that unfortunately he attempted to call to get an appointment with [place] but was not able to get anything in a sufficient amount of time to get her in sooner due to the fact that again we were having issues with staffing and scheduling. Therefore it was delayed getting him into the wound care center for his wife. Nonetheless she appears to have  evidence of cellulitis today of the bilateral lower extremities. Unfortunately she does not seem to be doing too well she is having a tremendous amount of pain and is asking for pain medication. Obviously it does look like her legs hurt although her wounds appear to be very superficial I am more concerned about the cellulitis aspect of this as opposed to the actual wound openings. 01/06/2020 upon evaluation today patient's legs actually appear to be doing significantly better as compared to last time I saw her when we were seeing her back for the first evaluation and actually had to send her to the ER for further evaluation. She was admitted to the hospital and was actually there from 12/12/2019 through 12/15/2019. With that being said the patient was placed on IV antibiotics initially, Ancef and IV fluids. Subsequently upon discharge she was given Keflex. With that being said she is getting need an extension of the Keflex as she was only given 3 days in 1 to make sure this completely resolves and does not cause her any additional issues or concerns here. I will send that in for them today. 01/13/2020 upon evaluation today patient appears to be doing decently well in regard to her legs. The areas where she had wounds previously have actually improved which is great news. There does not appear to be any signs of active infection at this time which is great news. With IV NSAIDs she did have some itching on the top of her foot that prompted them to  remove the dressing/compression wrap last week because she was just absolutely miserable over the weekend. Nonetheless I do think that we can do some things to try to help out here there may be a little bit of a fungal and for. We are going to treat that accordingly. 01/20/2020 upon evaluation today patient actually appears to be doing well with regard to her legs. In general I am very pleased with how things seem to be progressing. I do not see any signs of active infection at this time which is great news. Overall I think the patient is pleased as well she is not really having any pain and she tells me that itching is greatly improved 01/27/2020 upon evaluation today patient appears to be doing excellent in regard to her leg ulcers that were previously present. They have healed and seem to be doing great I see no signs of infection at this time. No fevers, chills, nausea, vomiting, or diarrhea. With that being said I do believe that the patient is making good progress but still seems to be very fluid overloaded. We discussed the possibility that she may need to be back on the Lasix she was taken off this at the hospital but nonetheless I think she needs to have something on board to help with her edema. Otherwise I think she is going to continue to have significant issues with new blisters forming as we see this week 02/03/2020 upon evaluation today patient appears to be doing decently well in regard to her legs. Fortunately there is no signs of active infection at this time which is great news. She does have a very slight issue going on with a couple blisters that actually have not ruptured and seem to be drying up that is good news overall I do not see any signs of active infection at this time also good news. 02/10/2020 upon evaluation today patient appears to be doing well with regard to her leg ulcers. In fact she is  healing quite nicely and only has a small blister on the right leg with a small  opening still remaining on the posterior aspect of her right leg. Other than these 2 areas the left leg appears to be completely healed as is everything else on the right. In general I feel like that the compression has done well and she is making good progress we just have not quite gotten to the point of everything being resolved as of yet. Fortunately patient shows no signs of active [place], Jillian B. ([id number]) infection at this time. I am very pleased 02/17/2020 upon evaluation today patient appears to be doing well with regard to her legs. There is no sign of actually open wounds at this point which is great news. No fevers, chills, nausea, vomiting, or diarrhea. I am very pleased with where things stand at this point. There does not appear to be any evidence of active infection at this time. Objective Constitutional Obese and well-hydrated in no acute distress. Vitals Time Taken: 12:56 PM, Height: 64 in, Weight: 205 lbs, BMI: 35.2, Temperature: 97.9 F, Pulse: 72 bpm, Respiratory Rate: 18 breaths/min, Blood Pressure: 174/75 mmHg. Respiratory normal breathing without difficulty. Psychiatric this patient is able to make decisions and demonstrates good insight into disease process. Alert and Oriented x 3. pleasant and cooperative. General Notes: Patient's wound bed showed signs of good granulation epithelization. There does not appear to be any evidence of overall worsening and in fact I think that she is completely healed based on what I see currently. I am very pleased with the fact that she has done so well and again I think we are ready for discharge she does have compression socks as well. Integumentary (Hair, Skin) Wound #26 status is Open. Original cause of wound was Gradually Appeared. The wound is located on the Right,Posterior Lower Leg. The wound measures 0cm length x 0cm width x 0cm depth; 0cm^2 area and 0cm^3 volume. There is no tunneling or undermining noted. There is a  medium amount of serous drainage noted. There is no granulation within the wound bed. There is no necrotic tissue within the wound bed. Assessment Active Problems ICD-10 Lymphedema, not elsewhere classified Venous insufficiency (chronic) (peripheral) Cellulitis of right lower limb Cellulitis of left lower limb Plan Discharge From [facility]: Discharge from [facility] Complete Wear compression garments daily. Put garments on first thing when you wake up and remove them before bed. Moisturize legs daily after removing compression garments. 1. Would recommend currently that we have the patient go ahead and continue with the wound care measures as before. Everything is healed but I do believe that compression is still of utmost importance. She does have compression stockings will initiate the use of those currently. 2. I am also can recommend that we have the patient go ahead and continue to monitor for any signs of wound openings. If anything occurs her husband will let us know. We will see her back for follow-up visit as needed. [name], [name] ([id number]) Electronic Signature(s) Signed: 02/17/2020 1:50:21 PM By: Worthy Keeler PA-C Entered By: Worthy Keeler on 02/17/2020 13:50:21 [name] ([id number]) -------------------------------------------------------------------------------- SuperBill Details Patient Name: [name] Date of Service: 02/17/2020 Medical Record Number: [id number] Patient Account Number: [id number] Date of Birth/Sex: 12/08/1945 (75 y.o. F) Treating RN: Dolan Amen Primary Care Provider: Lelon Huh Other Clinician: Referring Provider: Lelon Huh Treating Provider/Extender: Skipper Cliche in Treatment: 9 Diagnosis Coding ICD-10 Codes Code Description I89.0 Lymphedema, not  elsewhere classified I87.2 Venous insufficiency (chronic) (peripheral) L03.115 Cellulitis of right lower limb L03.116 Cellulitis of left lower  limb Facility Procedures CPT4 Code: [id number] Description: [phone] - WOUND CARE VISIT-LEV 2 EST PT Modifier: Quantity: 1 Physician Procedures CPT4 Code: [id number] Description: 72072 - WC PHYS LEVEL 3 - EST PT Modifier: Quantity: 1 CPT4 Code: Description: ICD-10 Diagnosis Description I89.0 Lymphedema, not elsewhere classified I87.2 Venous insufficiency (chronic) (peripheral) L03.115 Cellulitis of right lower limb L03.116 Cellulitis of left lower limb Modifier: Quantity: Electronic Signature(s) Signed: 02/17/2020 1:50:35 PM By: Worthy Keeler PA-C Entered By: Worthy Keeler on 02/17/2020 13:50:35

## 2020-02-19 NOTE — Progress Notes (Signed)
[name], [name] ([id number]) Visit Report for 02/17/2020 Arrival Information Details Patient Name: [name], [name]. Date of Service: 02/17/2020 12:45 PM Medical Record Number: [id number] Patient Account Number: [id number] Date of Birth/Sex: 1945/12/24 (75 y.o. F) Treating RN: Dolan Amen Primary Care Gervis Gaba: Lelon Huh Other Clinician: Referring Camar Guyton: Lelon Huh Treating Brelee Renk/Extender: Skipper Cliche in Treatment: 9 Visit Information History Since Last Visit All ordered tests and consults were completed: No Patient Arrived: Wheel Chair Added or deleted any medications: No Arrival Time: 12:51 Any new allergies or adverse reactions: No Accompanied By: husband Had a fall or experienced change in No Transfer Assistance: Manual activities of daily living that may affect Patient Identification Verified: Yes risk of falls: Secondary Verification Process Completed: Yes Signs or symptoms of abuse/neglect since last visito No Patient Requires Transmission-Based Precautions: No Hospitalized since last visit: No Patient Has Alerts: No Implantable device outside of the clinic excluding No cellular tissue based products placed in the center since last visit: Has Dressing in Place as Prescribed: Yes Has Compression in Place as Prescribed: Yes Pain Present Now: No Electronic Signature(s) Signed: 02/19/2020 9:40:15 AM By: Carlene Coria RN Entered By: Carlene Coria on 02/17/2020 76:16:07 [name] ([id number]) -------------------------------------------------------------------------------- Clinic Level of Care Assessment Details Patient Name: [name] Date of Service: 02/17/2020 12:45 PM Medical Record Number: [id number] Patient Account Number: [id number] Date of Birth/Sex: 1945/06/30 (75 y.o. F) Treating RN: Dolan Amen Primary Care Olusegun Gerstenberger: Lelon Huh Other Clinician: Referring Royden Bulman: Lelon Huh Treating Elier Zellars/Extender: Skipper Cliche in  Treatment: 9 Clinic Level of Care Assessment Items TOOL 4 Quantity Score X - Use when only an EandM is performed on FOLLOW-UP visit 1 0 ASSESSMENTS - Nursing Assessment / Reassessment X - Reassessment of Co-morbidities (includes updates in patient status) 1 10 X- 1 5 Reassessment of Adherence to Treatment Plan ASSESSMENTS - Wound and Skin Assessment / Reassessment []  - Simple Wound Assessment / Reassessment - one wound 0 []  - 0 Complex Wound Assessment / Reassessment - multiple wounds []  - 0 Dermatologic / Skin Assessment (not related to wound area) ASSESSMENTS - Focused Assessment X - Circumferential Edema Measurements - multi extremities 1 5 []  - 0 Nutritional Assessment / Counseling / Intervention []  - 0 Lower Extremity Assessment (monofilament, tuning fork, pulses) []  - 0 Peripheral Arterial Disease Assessment (using hand held doppler) ASSESSMENTS - Ostomy and/or Continence Assessment and Care []  - Incontinence Assessment and Management 0 []  - 0 Ostomy Care Assessment and Management (repouching, etc.) PROCESS - Coordination of Care X - Simple Patient / Family Education for ongoing care 1 15 []  - 0 Complex (extensive) Patient / Family Education for ongoing care []  - 0 Staff obtains Programmer, systems, Records, Test Results / Process Orders []  - 0 Staff telephones HHA, Nursing Homes / Clarify orders / etc []  - 0 Routine Transfer to another Facility (non-emergent condition) []  - 0 Routine Hospital Admission (non-emergent condition) []  - 0 New Admissions / Biomedical engineer / Ordering NPWT, Apligraf, etc. []  - 0 Emergency Hospital Admission (emergent condition) X- 1 10 Simple Discharge Coordination []  - 0 Complex (extensive) Discharge Coordination PROCESS - Special Needs []  - Pediatric / Minor Patient Management 0 []  - 0 Isolation Patient Management []  - 0 Hearing / Language / Visual special needs []  - 0 Assessment of Community assistance (transportation, D/C  planning, etc.) []  - 0 Additional assistance / Altered mentation []  - 0 Support Surface(s) Assessment (bed, cushion, seat, etc.) INTERVENTIONS - Wound Cleansing / Measurement [name], Jillian B. (  [id number]) []  - 0 Simple Wound Cleansing - one wound []  - 0 Complex Wound Cleansing - multiple wounds []  - 0 Wound Imaging (photographs - any number of wounds) []  - 0 Wound Tracing (instead of photographs) []  - 0 Simple Wound Measurement - one wound []  - 0 Complex Wound Measurement - multiple wounds INTERVENTIONS - Wound Dressings []  - Small Wound Dressing one or multiple wounds 0 []  - 0 Medium Wound Dressing one or multiple wounds []  - 0 Large Wound Dressing one or multiple wounds []  - 0 Application of Medications - topical []  - 0 Application of Medications - injection INTERVENTIONS - Miscellaneous []  - External ear exam 0 []  - 0 Specimen Collection (cultures, biopsies, blood, body fluids, etc.) []  - 0 Specimen(s) / Culture(s) sent or taken to Lab for analysis []  - 0 Patient Transfer (multiple staff / Civil Service fast streamer / Similar devices) []  - 0 Simple Staple / Suture removal (25 or less) []  - 0 Complex Staple / Suture removal (26 or more) []  - 0 Hypo / Hyperglycemic Management (close monitor of Blood Glucose) []  - 0 Ankle / Brachial Index (ABI) - do not check if billed separately X- 1 5 Vital Signs Has the patient been seen at the hospital within the last three years: Yes Total Score: 50 Level Of Care: New/Established - Level 2 Electronic Signature(s) Signed: 02/17/2020 5:14:05 PM By: Georges Mouse, Minus Breeding RN Entered By: Georges Mouse, Minus Breeding on 02/17/2020 13:14:17 [name] ([id number]) -------------------------------------------------------------------------------- Encounter Discharge Information Details Patient Name: Jillian Albe B. Date of Service: 02/17/2020 12:45 PM Medical Record Number: [id number] Patient Account Number: [id number] Date of Birth/Sex: 1945/05/06  (75 y.o. F) Treating RN: Dolan Amen Primary Care Grayce Budden: Lelon Huh Other Clinician: Referring Dre Gamino: Lelon Huh Treating Krissi Willaims/Extender: Skipper Cliche in Treatment: 9 Encounter Discharge Information Items Discharge Condition: Stable Ambulatory Status: Wheelchair Discharge Destination: Home Transportation: Private Auto Accompanied By: husband Schedule Follow-up Appointment: Yes Clinical Summary of Care: Electronic Signature(s) Signed: 02/17/2020 5:14:05 PM By: Georges Mouse, Minus Breeding RN Entered By: Georges Mouse, Minus Breeding on 02/17/2020 13:15:33 [name] ([id number]) -------------------------------------------------------------------------------- Lower Extremity Assessment Details Patient Name: [name], Jillian B. Date of Service: 02/17/2020 12:45 PM Medical Record Number: [id number] Patient Account Number: [id number] Date of Birth/Sex: [birth year] (75 y.o. F) Treating RN: Carlene Coria Primary Care Fernand Sorbello: Lelon Huh Other Clinician: Referring Tipton Ballow: Lelon Huh Treating Aleeza Bellville/Extender: Skipper Cliche in Treatment: 9 Edema Assessment Assessed: [Left: No] Patrice Paradise: No] [Left: Edema] [Right: :] Calf Left: Right: Point of Measurement: 33 cm From Medial Instep 38 cm Ankle Left: Right: Point of Measurement: 8 cm From Medial Instep 21.5 cm Vascular Assessment Pulses: Dorsalis Pedis Palpable: [Right:Yes] Electronic Signature(s) Signed: 02/19/2020 9:40:15 AM By: Carlene Coria RN Entered By: Carlene Coria on 02/17/2020 13:05:50 [name] ([id number]) -------------------------------------------------------------------------------- Multi Wound Chart Details Patient Name: Jillian Albe B. Date of Service: 02/17/2020 12:45 PM Medical Record Number: [id number] Patient Account Number: [id number] Date of Birth/Sex: [birth year] (75 y.o. F) Treating RN: Dolan Amen Primary Care Rashema Seawright: Lelon Huh Other Clinician: Referring Khaniyah Bezek: Lelon Huh Treating Elani Delph/Extender: Skipper Cliche in Treatment: 9 Vital Signs Height(in): 64 Pulse(bpm): 28 Weight(lbs): 205 Blood Pressure(mmHg): 174/75 Body Mass Index(BMI): 35 Temperature(F): 97.9 Respiratory Rate(breaths/min): 18 Photos: [N/A:N/A] Wound Location: Right, Posterior Lower Leg N/A N/A Wounding Event: Gradually Appeared N/A N/A Primary Etiology: Lymphedema N/A N/A Comorbid History: Lymphedema, Asthma, Congestive N/A N/A Heart Failure, Coronary Artery Disease, Hypertension, Type II Diabetes, Osteoarthritis Date Acquired: 02/10/2020 N/A N/A Weeks of Treatment: 1 N/A  N/A Wound Status: Open N/A N/A Measurements L x W x D (cm) 0x0x0 N/A N/A Area (cm) : 0 N/A N/A Volume (cm) : 0 N/A N/A % Reduction in Area: 100.00% N/A N/A % Reduction in Volume: 100.00% N/A N/A Classification: Full Thickness Without Exposed N/A N/A Support Structures Exudate Amount: Medium N/A N/A Exudate Type: Serous N/A N/A Exudate Color: amber N/A N/A Granulation Amount: None Present (0%) N/A N/A Necrotic Amount: None Present (0%) N/A N/A Exposed Structures: Fascia: No N/A N/A Fat Layer (Subcutaneous Tissue): No Tendon: No Muscle: No Joint: No Bone: No Epithelialization: Large (67-100%) N/A N/A Treatment Notes Electronic Signature(s) Signed: 02/17/2020 5:14:05 PM By: Georges Mouse, Minus Breeding RN Entered By: Georges Mouse, Minus Breeding on 02/17/2020 13:13:24 [name] ([id number]) -------------------------------------------------------------------------------- [place] Details Patient Name: [name], Jillian B. Date of Service: 02/17/2020 12:45 PM Medical Record Number: [id number] Patient Account Number: [id number] Date of Birth/Sex: 04/07/1945 (75 y.o. F) Treating RN: Dolan Amen Primary Care Marites Nath: Lelon Huh Other Clinician: Referring Tondalaya Perren: Lelon Huh Treating Mikah Poss/Extender: Skipper Cliche in Treatment: 9 Active Inactive Electronic  Signature(s) Signed: 02/17/2020 5:14:05 PM By: Georges Mouse, Minus Breeding RN Entered By: Georges Mouse, Minus Breeding on 02/17/2020 13:13:16 [name] ([id number]) -------------------------------------------------------------------------------- Pain Assessment Details Patient Name: [name], HUNSBERGER. Date of Service: 02/17/2020 12:45 PM Medical Record Number: [id number] Patient Account Number: [id number] Date of Birth/Sex: February 10, 1945 (75 y.o. F) Treating RN: Carlene Coria Primary Care Jontrell Bushong: Lelon Huh Other Clinician: Referring Vibhav Waddill: Lelon Huh Treating Shona Pardo/Extender: Skipper Cliche in Treatment: 9 Active Problems Location of Pain Severity and Description of Pain Patient Has Paino No Site Locations Pain Management and Medication Current Pain Management: Electronic Signature(s) Signed: 02/19/2020 9:40:15 AM By: Carlene Coria RN Entered By: Carlene Coria on 02/17/2020 12:57:41 [name] ([id number]) -------------------------------------------------------------------------------- Patient/Caregiver Education Details Patient Name: [name] Date of Service: 02/17/2020 12:45 PM Medical Record Number: [id number] Patient Account Number: [id number] Date of Birth/Gender: [birth year] (75 y.o. F) Treating RN: Dolan Amen Primary Care Physician: Lelon Huh Other Clinician: Referring Physician: Lelon Huh Treating Physician/Extender: Skipper Cliche in Treatment: 9 Education Assessment Education Provided To: Patient Education Topics Provided Notes Wear compression stockings daily, continue elevating legs. Moisturize at night Electronic Signature(s) Signed: 02/17/2020 5:14:05 PM By: Georges Mouse, Minus Breeding RN Entered By: Georges Mouse, Minus Breeding on 02/17/2020 13:15:06 [name] ([id number]) -------------------------------------------------------------------------------- Wound Assessment Details Patient Name: [name], Jillian B. Date of Service: 02/17/2020  12:45 PM Medical Record Number: [id number] Patient Account Number: [id number] Date of Birth/Sex: 10-24-1945 (75 y.o. F) Treating RN: Carlene Coria Primary Care Bentley Haralson: Lelon Huh Other Clinician: Referring Heela Heishman: Lelon Huh Treating Myleen Brailsford/Extender: Skipper Cliche in Treatment: 9 Wound Status Wound Number: [id number] Primary Lymphedema Etiology: Wound Location: Right, Posterior Lower Leg Wound Open Wounding Event: Gradually Appeared Status: Date Acquired: 02/10/2020 Comorbid Lymphedema, Asthma, Congestive Heart Failure, Weeks Of Treatment: 1 History: Coronary Artery Disease, Hypertension, Type II Diabetes, Clustered Wound: No Osteoarthritis Photos Wound Measurements Length: (cm) 0 Width: (cm) 0 Depth: (cm) 0 Area: (cm) Volume: (cm) % Reduction in Area: 100% % Reduction in Volume: 100% Epithelialization: Large (67-100%) 0 Tunneling: No 0 Undermining: No Wound Description Classification: Full Thickness Without Exposed Support Structu Exudate Amount: Medium Exudate Type: Serous Exudate Color: amber res Foul Odor After Cleansing: No Slough/Fibrino No Wound Bed Granulation Amount: None Present (0%) Exposed Structure Necrotic Amount: None Present (0%) Fascia Exposed: No Fat Layer (Subcutaneous Tissue) Exposed: No Tendon Exposed: No Muscle Exposed: No Joint Exposed: No Bone Exposed: No Electronic Signature(s)  Signed: 02/19/2020 9:40:15 AM By: Carlene Coria RN Entered By: Carlene Coria on 02/17/2020 13:05:10 [name] ([id number]) -------------------------------------------------------------------------------- Vitals Details Patient Name: Jillian Albe B. Date of Service: 02/17/2020 12:45 PM Medical Record Number: [id number] Patient Account Number: [id number] Date of Birth/Sex: [birth year] (75 y.o. F) Treating RN: Carlene Coria Primary Care Alyza Artiaga: Lelon Huh Other Clinician: Referring Uri Turnbough: Lelon Huh Treating Remy Dia/Extender: Skipper Cliche in Treatment: 9 Vital Signs Time Taken: 12:56 Temperature (F): 97.9 Height (in): 64 Pulse (bpm): 72 Weight (lbs): 205 Respiratory Rate (breaths/min): 18 Body Mass Index (BMI): 35.2 Blood Pressure (mmHg): 174/75 Reference Range: 80 - 120 mg / dl Electronic Signature(s) Signed: 02/19/2020 9:40:15 AM By: Carlene Coria RN Entered By: Carlene Coria on 02/17/2020 12:57:11

## 2020-02-24 ENCOUNTER — Other Ambulatory Visit: Payer: Self-pay | Admitting: *Deleted

## 2020-02-24 MED ORDER — METOPROLOL TARTRATE 50 MG PO TABS: 75.0000 mg | ORAL_TABLET | Freq: Two times a day (BID) | ORAL | 0 refills | Status: DC

## 2020-03-11 ENCOUNTER — Other Ambulatory Visit: Payer: Self-pay | Admitting: Family Medicine

## 2020-03-11 NOTE — Telephone Encounter (Signed)
Requested Prescriptions  Pending Prescriptions Disp Refills  . albuterol (VENTOLIN HFA) 108 (90 Base) MCG/ACT inhaler [Pharmacy Med Name: ALBUTEROL HFA (PROAIR) INHALER] 8.5 each 1    Sig: INHALE 1-2 PUFFS INTO THE LUNGS EVERY 6 (SIX) HOURS AS NEEDED FOR SHORTNESS OF BREATH.     Pulmonology:  Beta Agonists Failed - 03/11/2020  1:22 AM      Failed - One inhaler should last at least one month. If the patient is requesting refills earlier, contact the patient to check for uncontrolled symptoms.      Passed - Valid encounter within last 12 months    Recent Outpatient Visits          3 weeks ago Diabetes mellitus with nephropathy [facility])   [facility] Birdie Sons, MD   5 months ago Diabetes mellitus with nephropathy [facility])   [facility] Birdie Sons, MD   8 months ago Diabetes mellitus with nephropathy [facility])   [facility] Birdie Sons, MD   11 months ago Diabetes mellitus with nephropathy [facility])   [facility] Birdie Sons, MD   1 year ago Annual physical exam   [facility] Birdie Sons, MD      Future Appointments            In 1 month End, Harrell Gave, MD [facility], [facility]   In 2 months Fisher, Kirstie Peri, MD [facility], [place]   In 4 months McGowan, Shannon A, [place]

## 2020-04-14 DIAGNOSIS — E113393 Type 2 diabetes mellitus with moderate nonproliferative diabetic retinopathy without macular edema, bilateral: Secondary | ICD-10-CM | POA: Diagnosis not present

## 2020-04-23 ENCOUNTER — Other Ambulatory Visit: Payer: Self-pay | Admitting: Family Medicine

## 2020-04-23 ENCOUNTER — Other Ambulatory Visit: Payer: Self-pay

## 2020-04-23 MED ORDER — AMLODIPINE BESYLATE 5 MG PO TABS: 5.0000 mg | ORAL_TABLET | Freq: Every day | ORAL | 1 refills | Status: DC

## 2020-04-23 MED ORDER — METOPROLOL TARTRATE 50 MG PO TABS: 75.0000 mg | ORAL_TABLET | Freq: Two times a day (BID) | ORAL | 0 refills | Status: DC

## 2020-04-23 NOTE — Telephone Encounter (Signed)
Requested Prescriptions  Pending Prescriptions Disp Refills  . albuterol (VENTOLIN HFA) 108 (90 Base) MCG/ACT inhaler [Pharmacy Med Name: ALBUTEROL HFA (PROAIR) INHALER] 8.5 each 1    Sig: INHALE 1-2 PUFFS INTO THE LUNGS EVERY 6 (SIX) HOURS AS NEEDED FOR SHORTNESS OF BREATH.     Pulmonology:  Beta Agonists Failed - 04/23/2020 11:42 AM      Failed - One inhaler should last at least one month. If the patient is requesting refills earlier, contact the patient to check for uncontrolled symptoms.      Passed - Valid encounter within last 12 months    Recent Outpatient Visits          2 months ago Diabetes mellitus with nephropathy [facility])   [facility] Birdie Sons, MD   6 months ago Diabetes mellitus with nephropathy [facility])   [facility] Birdie Sons, MD   9 months ago Diabetes mellitus with nephropathy [facility])   [facility] Birdie Sons, MD   1 year ago Diabetes mellitus with nephropathy [facility])   [facility] Birdie Sons, MD   1 year ago Annual physical exam   [facility] Birdie Sons, MD      Future Appointments            In 5 days End, Harrell Gave, MD [facility], [place]   In 1 month Fisher, Kirstie Peri, MD [facility], [place]   In 2 months McGowan, Shannon A, [place]

## 2020-04-28 ENCOUNTER — Ambulatory Visit: Payer: Medicare Other | Admitting: Internal Medicine

## 2020-04-28 ENCOUNTER — Encounter: Payer: Self-pay | Admitting: Internal Medicine

## 2020-04-28 ENCOUNTER — Other Ambulatory Visit: Payer: Self-pay

## 2020-04-28 VITALS — BP 150/80 | HR 63 | Ht 64.0 in | Wt 200.0 lb

## 2020-04-28 DIAGNOSIS — I350 Nonrheumatic aortic (valve) stenosis: Secondary | ICD-10-CM

## 2020-04-28 DIAGNOSIS — I5022 Chronic systolic (congestive) heart failure: Secondary | ICD-10-CM | POA: Diagnosis not present

## 2020-04-28 DIAGNOSIS — N1832 Chronic kidney disease, stage 3b: Secondary | ICD-10-CM | POA: Diagnosis not present

## 2020-04-28 DIAGNOSIS — I2510 Atherosclerotic heart disease of native coronary artery without angina pectoris: Secondary | ICD-10-CM | POA: Diagnosis not present

## 2020-04-28 DIAGNOSIS — I10 Essential (primary) hypertension: Secondary | ICD-10-CM | POA: Diagnosis not present

## 2020-04-28 MED ORDER — LISINOPRIL 10 MG PO TABS: 10.0000 mg | ORAL_TABLET | Freq: Every day | ORAL | 1 refills | Status: DC

## 2020-04-28 NOTE — Patient Instructions (Signed)
Medication Instructions:  - Your physician has recommended you make the following change in your medication:   1) INCREASE lisinopril to 10 mg- take 1 tablet by mouth once daily   *If you need a refill on your cardiac medications before your next appointment, please call your pharmacy*   Lab Work: - Your physician recommends that you have lab work today: BMP  If you have labs (blood work) drawn today and your tests are completely normal, you will receive your results only by: Marland Kitchen MyChart Message (if you have MyChart) OR . A paper copy in the mail If you have any lab test that is abnormal or we need to change your treatment, we will call you to review the results.   Testing/Procedures: - none ordered   Follow-Up: At [facility], you and your health needs are our priority.  As part of our continuing mission to provide you with exceptional heart care, we have created designated Provider Care Teams.  These Care Teams include your primary Cardiologist (physician) and Advanced Practice Providers (APPs -  Physician Assistants and Nurse Practitioners) who all work together to provide you with the care you need, when you need it.  We recommend signing up for the patient portal called "MyChart".  Sign up information is provided on this After Visit Summary.  MyChart is used to connect with patients for Virtual Visits (Telemedicine).  Patients are able to view lab/test results, encounter notes, upcoming appointments, etc.  Non-urgent messages can be sent to your provider as well.   To learn more about what you can do with MyChart, go to [email or web address].    Your next appointment:   3 month(s)  The format for your next appointment:   In Person  Provider:   You may see Nelva Bush, MD or one of the following Advanced Practice Providers on your designated Care Team:    Murray Hodgkins, NP  Christell Faith, PA-C  Marrianne Mood, PA-C  C[place]ence Agency, [place]  Laurann Montana,  NP    Other Instructions n/a

## 2020-04-28 NOTE — Progress Notes (Signed)
Follow-up Outpatient Visit Date: 04/28/2020  Primary Care Provider: Birdie Sons, MD [address] [place] [address]  Chief Complaint: Follow-up heart failure  HPI:  [name] is a 75 y.o. female with history of nonobstructive coronary artery disease, chronic HFrEF with recovered ejection fraction due to nonischemic cardiomyopathy, aortic stenosis, hypertension, diabetes mellitus, and multiple myeloma, who presents for follow-up of heart failure with chronic leg edema.  I last saw her in January, at which time she felt relatively well.  Leg swelling had improved with ongoing follow-up in the wound care clinic after preceding hospitalization for worsening leg edema and bilateral lower extremity cellulitis complicated by AKI.  At our last visit, I recommended restarting furosemide 40 mg on Mondays, Wednesdays, and Fridays.  Today, Ms. Cherney reports that she has been doing relatively well.  She continues to have some leg edema though it seems to be better than at prior visits with resolution of leg wounds.  There is no weeping at this time.  Ms. Krull has not been taking furosemide most days due to urinary frequency.  For a brief time, she had stopped all of her medications but restarted them at the encouragement of her husband and Dr. Caryn Section.  She denies chest pain, shortness of breath, palpitations, and lightheadedness.  She remains sedentary and spends most of her days seated with her legs in a dependent position.  --------------------------------------------------------------------------------------------------  Cardiovascular History & Procedures: Cardiovascular Problems:  Chronic systolic and diastolic heart failure secondary to non-ischemic cardiomyopathy  Nonobstructive coronary artery disease  Aortic stenosis  Risk Factors:  Known coronary artery disease, hypertension, hyperlipidemia, diabetes mellitus, sedentary lifestyle, obesity, and age >  29  Cath/PCI:  LHC/RHC (03/07/16): LMCA normal. LAD with 30% ostial stenosi, 30%midvessel narrowing, and 50% distal disease. LCx with 40% midvessel lesion. RCA with 30% proximal narrowing. LVEDP 26 mmHg. RA 18 (prominent "M" appearance), RV 82/21, PA mean 55, PCWP 25. Ao sat 96%, PA sat 54%, Fick Co/Ci 3.2/1.7. PVR 9.4 Wood units. No significant AoV gradient.  CV Surgery:  None  EP Procedures and Devices:  None  Non-Invasive Evaluation(s):  TTE (04/18/2019): Normal LV size with mild LVH. LVEF 60-65% with grade 1 diastolic dysfunction. Normal RV size and function. Mild pulmonary hypertension. Very mild aortic stenosis.  ABI's (07/26/16):Right:ABI notobtained, TBI 1.8. Left:ABI 1.9, TBI 1.0.Triphasic waveforms noted in both lower extremities.  TTE (07/26/16): Normal LV size with moderate LVH. LVEF 55-60% with normal wall motion. Grade 1 diastolic dysfunction. Mild aortic stenosis (mean gradient 11 mmHg). Mitral annular calcification with mild to moderate MR. Mild left atrial enlargement. Normal RV size and function. Normal PA pressure.  TTE (02/25/16): Normal LV size with moderate LVH. LVEF 25-30% with grade 2 diastolic dysfunction. Thickened aortic valve with likely moderate stenosis. MAC with mild MR. Mild LA enlargement. Mildly to moderately reduced RV contraction. Mild-moderate TR. Moderately PH (RVSP 55 mmHg).   Recent CV Pertinent Labs: Lab Results  Component Value Date   CHOL 164 09/30/2019   HDL 41 09/30/2019   LDLCALC 98 09/30/2019   TRIG 140 09/30/2019   CHOLHDL 4.0 09/30/2019   INR 1.0 03/02/2016   BNP 138.6 (H) 09/30/2019   BNP 1,592.0 (H) 02/09/2016   K 4.2 02/16/2020   BUN 31 (H) 02/16/2020   CREATININE 1.63 (H) 02/16/2020   CREATININE 1.31 (H) 12/21/2016    Past medical and surgical history were reviewed and updated in EPIC.  Current Meds  Medication Sig  . albuterol (VENTOLIN HFA)  108 (90 Base) MCG/ACT inhaler INHALE 1-2 PUFFS INTO THE LUNGS EVERY 6  (SIX) HOURS AS NEEDED FOR SHORTNESS OF BREATH.  Marland Kitchen amLODipine (NORVASC) 5 MG tablet Take 1 tablet (5 mg total) by mouth daily.  . Cholecalciferol (VITAMIN D) 50 MCG (2000 UT) tablet Take 2,000 Units by mouth daily.  . cyanocobalamin 500 MCG tablet Take 250 mcg by mouth daily.   Marland Kitchen HYDROcodone-acetaminophen (NORCO) 10-325 MG tablet Take 1 tablet by mouth every 6 (six) hours as needed.  . levalbuterol (XOPENEX) 1.25 MG/3ML nebulizer solution Take 1.25 mg by nebulization every 8 (eight) hours as needed for wheezing.  Marland Kitchen lisinopril (ZESTRIL) 10 MG tablet Take 1 tablet (10 mg total) by mouth daily.  . metoprolol tartrate (LOPRESSOR) 50 MG tablet Take 1.5 tablets (75 mg total) by mouth 2 (two) times daily.  Marland Kitchen oxybutynin (DITROPAN) 5 MG tablet Take 1 tablet (5 mg total) by mouth 2 (two) times daily.  . [DISCONTINUED] lisinopril (ZESTRIL) 5 MG tablet Take 5 mg by mouth daily.    Allergies: Hydrochlorothiazide and Penicillins  Social History   Tobacco Use  . Smoking status: Never Smoker  . Smokeless tobacco: Never Used  Vaping Use  . Vaping Use: Never used  Substance Use Topics  . Alcohol use: No  . Drug use: No    Family History  Problem Relation Age of Onset  . Cancer Mother        Uterine cancer  . Diabetes Mother   . Heart disease Mother   . Heart failure Mother   . Parkinson's disease Father   . Cancer Sister        breast cancer  . Diabetes Brother        Non-insulin Dependent Diabetes Mellitus  . Mesothelioma Brother   . Hypertension Sister   . Diabetes Sister        Non-insulin dependent Diabetes Mellitus    Review of Systems: A 12-system review of systems was performed and was negative except as noted in the HPI.  --------------------------------------------------------------------------------------------------  Physical Exam: BP (!) 150/80 (BP Location: Right Arm, Patient Position: Sitting, Cuff Size: Large)   Pulse 63   Ht _0  (1.626 m)   Wt 200 lb (90.7 kg)  Comment: Estimated by patient's husband  SpO2 94%   BMI 34.33 kg/m   General:  NAD.  Seated in a wheelchair and accompanied by her husband. Neck: No JVD or HJR, though evaluation is limited by body habitus. Lungs: Clear to auscultation bilaterally without wheezes or crackles. Heart: Regular rate and rhythm with 2/6 systolic murmur. Abdomen: Soft, nontender, nondistended. Extremities: 1+ chronic appearing leg edema.  EKG: Normal sinus rhythm with borderline LVH.  Lab Results  Component Value Date   WBC 7.1 02/16/2020   HGB 9.7 (L) 02/16/2020   HCT 32.0 (L) 02/16/2020   MCV 90 02/16/2020   PLT 265 02/16/2020    Lab Results  Component Value Date   NA 140 02/16/2020   K 4.2 02/16/2020   CL 101 02/16/2020   CO2 26 02/16/2020   BUN 31 (H) 02/16/2020   CREATININE 1.63 (H) 02/16/2020   GLUCOSE 143 (H) 02/16/2020   ALT 7 12/11/2019    Lab Results  Component Value Date   CHOL 164 09/30/2019   HDL 41 09/30/2019   LDLCALC 98 09/30/2019   TRIG 140 09/30/2019   CHOLHDL 4.0 09/30/2019    --------------------------------------------------------------------------------------------------  ASSESSMENT AND PLAN: Chronic HFrEF with recovered LVEF due to nonischemic cardiomyopathy: Ms. Whidden still has  some leg edema but appears relatively stable in regard to her volume status.  Despite being noncompliant with diuretic therapy her weight is down 6 pounds since February.  We discussed the importance of elevating her legs to help with dependent edema.  She has significant worsening of her edema, she will need to restart furosemide, at least temporarily.  Due to suboptimally controlled blood pressure, I have suggested increasing lisinopril to 10 mg daily.  We will check a be met today to ensure stable renal function in the setting of her CKD.  BMP will also need to be repeated in about 1 to 2 weeks.  We will continue metoprolol tartrate 75 mg daily.  Aortic stenosis: Mild on most recent echo.   Continue clinical follow-up with repeat echo in 1 to 2 years.  Nonobstructive coronary artery disease: No symptoms suggest worsening coronary insufficiency.  No further work-up at this time.  Hypertension: Blood pressure suboptimally controlled today.  We will increase lisinopril to 10 mg daily if renal function and potassium allow.  Chronic kidney disease stage IIIb: Renal function improved from hospitalization earlier this year but still above baseline.  We will attempt to increase lisinopril, as outlined above.  Further work-up per Dr. Caryn Section.  Nephrology consultation may be useful if renal function continues to decline.  Follow-up: Return to clinic in 3 months.  Nelva Bush, MD 04/28/2020 7:53 AM

## 2020-04-29 ENCOUNTER — Telehealth: Payer: Self-pay

## 2020-04-29 ENCOUNTER — Encounter: Payer: Self-pay | Admitting: Internal Medicine

## 2020-04-29 DIAGNOSIS — R799 Abnormal finding of blood chemistry, unspecified: Secondary | ICD-10-CM

## 2020-04-29 DIAGNOSIS — R7989 Other specified abnormal findings of blood chemistry: Secondary | ICD-10-CM

## 2020-04-29 LAB — BASIC METABOLIC PANEL
BUN/Creatinine Ratio: 13 (ref 12–28)
BUN: 27 mg/dL (ref 8–27)
CO2: 24 mmol/L (ref 20–29)
Calcium: 9.6 mg/dL (ref 8.7–10.3)
Chloride: 101 mmol/L (ref 96–106)
Creatinine, Ser: 2.03 mg/dL — ABNORMAL HIGH (ref 0.57–1.00)
Glucose: 153 mg/dL — ABNORMAL HIGH (ref 65–99)
Potassium: 4.1 mmol/L (ref 3.5–5.2)
Sodium: 139 mmol/L (ref 134–144)
eGFR: 25 mL/min/{1.73_m2} — ABNORMAL LOW (ref >59)

## 2020-04-29 NOTE — Telephone Encounter (Signed)
Attempted to reach out to [name], was bale to get her husband (DPR approved), recent over recent lab results from yesterday visit  "Kidney function slightly worse than baseline. I encouraged [name] to drink a little more water but to minimize her sodium intake. I think it is okay to increase lisinopril as discussed yesterday, but we should repeat a BMP in about a week to ensure stable kidney function and electrolytes. "- [name]  [name] verbalized understanding, reiterated the 10 mg Lisinopril as discussed yesterday in clinic, he will have Mrs. Houchen monitor her salt/sodium intake and increase her fluids to stay hydrated. Will return to [name] in one week for repeat BMP, order has been placed. Otherwise all questions or concerns were address and no additional concerns at this time. Agreeable to plan, will call back for anything further.

## 2020-05-07 ENCOUNTER — Other Ambulatory Visit
Admission: RE | Admit: 2020-05-07 | Discharge: 2020-05-07 | Disposition: A | Discharge status: Home / Self Care | Payer: Medicare Other | Attending: Internal Medicine | Admitting: Internal Medicine

## 2020-05-07 ENCOUNTER — Other Ambulatory Visit: Payer: Self-pay

## 2020-05-07 DIAGNOSIS — R7989 Other specified abnormal findings of blood chemistry: Secondary | ICD-10-CM | POA: Diagnosis not present

## 2020-05-07 LAB — BASIC METABOLIC PANEL
Anion gap: 6 (ref 5–15)
BUN: 30 mg/dL — ABNORMAL HIGH (ref 8–23)
CO2: 27 mmol/L (ref 22–32)
Calcium: 9.2 mg/dL (ref 8.9–10.3)
Chloride: 104 mmol/L (ref 98–111)
Creatinine, Ser: 1.85 mg/dL — ABNORMAL HIGH (ref 0.44–1.00)
GFR, Estimated: 28 mL/min — ABNORMAL LOW (ref >60)
Glucose, Bld: 170 mg/dL — ABNORMAL HIGH (ref 70–99)
Potassium: 3.9 mmol/L (ref 3.5–5.1)
Sodium: 137 mmol/L (ref 135–145)

## 2020-05-14 ENCOUNTER — Other Ambulatory Visit: Payer: Self-pay | Admitting: Family Medicine

## 2020-05-14 NOTE — Telephone Encounter (Signed)
Requested Prescriptions  Pending Prescriptions Disp Refills  . levalbuterol (XOPENEX) 1.25 MG/3ML nebulizer solution [Pharmacy Med Name: LEVALBUTEROL 1.25 MG/3 ML SOL] 90 mL 3    Sig: USE 1 VIAL IN NEBULIZER EVERY 8 HOURS AS NEEDED FOR WHEEZING     Pulmonology:  Beta Agonists Failed - 05/14/2020  8:05 AM      Failed - One inhaler should last at least one month. If the patient is requesting refills earlier, contact the patient to check for uncontrolled symptoms.      Passed - Valid encounter within last 12 months    Recent Outpatient Visits          2 months ago Diabetes mellitus with nephropathy [facility])   [facility] Birdie Sons, MD   7 months ago Diabetes mellitus with nephropathy [facility])   [facility] Birdie Sons, MD   10 months ago Diabetes mellitus with nephropathy [facility])   [facility] Birdie Sons, MD   1 year ago Diabetes mellitus with nephropathy [facility])   [facility] Birdie Sons, MD   1 year ago Annual physical exam   [facility] Birdie Sons, MD      Future Appointments            In 1 week Fisher, Kirstie Peri, MD [facility], [place]   In 2 months McGowan, Gordan Payment [place]   In 2 months Mickle Plumb, Jacquelyn D, [place], [place]

## 2020-05-24 ENCOUNTER — Encounter: Payer: Self-pay | Admitting: Family Medicine

## 2020-05-24 ENCOUNTER — Ambulatory Visit (INDEPENDENT_AMBULATORY_CARE_PROVIDER_SITE_OTHER): Payer: Medicare Other | Admitting: Family Medicine

## 2020-05-24 ENCOUNTER — Other Ambulatory Visit: Payer: Self-pay

## 2020-05-24 VITALS — BP 150/81 | HR 52 | Temp 97.0°F | Resp 18

## 2020-05-24 DIAGNOSIS — E1122 Type 2 diabetes mellitus with diabetic chronic kidney disease: Secondary | ICD-10-CM | POA: Diagnosis not present

## 2020-05-24 DIAGNOSIS — I5022 Chronic systolic (congestive) heart failure: Secondary | ICD-10-CM | POA: Diagnosis not present

## 2020-05-24 DIAGNOSIS — I890 Lymphedema, not elsewhere classified: Secondary | ICD-10-CM

## 2020-05-24 DIAGNOSIS — N184 Chronic kidney disease, stage 4 (severe): Secondary | ICD-10-CM | POA: Diagnosis not present

## 2020-05-24 DIAGNOSIS — I872 Venous insufficiency (chronic) (peripheral): Secondary | ICD-10-CM

## 2020-05-24 LAB — POCT GLYCOSYLATED HEMOGLOBIN (HGB A1C)
Est. average glucose Bld gHb Est-mCnc: 157
Hemoglobin A1C: 7.1 % — AB (ref 4.0–5.6)

## 2020-05-24 NOTE — Progress Notes (Signed)
Established patient visit   Patient: [name]   DOB: Aug 05, 1945   75 y.o. Female  MRN: [id number] Visit Date: 05/24/2020  Today's healthcare provider: Lelon Huh, MD   Chief Complaint  Patient presents with  . Diabetes  . Hypertension   Subjective    HPI  Diabetes Mellitus Type II, Follow-up  Lab Results  Component Value Date   HGBA1C 7.1 (A) 05/24/2020   HGBA1C 7.1 (A) 02/16/2020   HGBA1C 7.4 (H) 12/11/2019   Wt Readings from Last 3 Encounters:  04/28/20 200 lb (90.7 kg)  01/28/20 206 lb (93.4 kg)  12/11/19 200 lb (90.7 kg)   Last seen for diabetes 3 months ago.  Management since then includes continuing same medication. She reports good compliance with treatment. She is not having side effects.  Symptoms: No fatigue No foot ulcerations  No appetite changes No nausea  No paresthesia of the feet  No polydipsia  No polyuria No visual disturbances   No vomiting     Home blood sugar records: blood sugars are not checked  Episodes of hypoglycemia? No    Current insulin regiment: none Most Recent Eye Exam: 10/08/2019 Current exercise: none Current diet habits: in general, an "unhealthy" diet  Pertinent Labs: Lab Results  Component Value Date   CHOL 164 09/30/2019   HDL 41 09/30/2019   LDLCALC 98 09/30/2019   TRIG 140 09/30/2019   CHOLHDL 4.0 09/30/2019   Lab Results  Component Value Date   NA 137 05/07/2020   K 3.9 05/07/2020   CREATININE 1.85 (H) 05/07/2020   GFRNONAA 28 (L) 05/07/2020   GFRAA 35 (L) 02/16/2020   GLUCOSE 170 (H) 05/07/2020     ---------------------------------------------------------------------------------------------------  Hypertension, follow-up  BP Readings from Last 3 Encounters:  05/24/20 (!) 150/81  04/28/20 (!) 150/80  02/16/20 (!) 152/74   Wt Readings from Last 3 Encounters:  04/28/20 200 lb (90.7 kg)  01/28/20 206 lb (93.4 kg)  12/11/19 200 lb (90.7 kg)     She was last seen for hypertension 3  months ago.  BP at that visit was 152/74. Management since that visit includes started Lisinopril 64m daily. Since last visit, patient was seen by Cardiology who increased Lisinopril to 145mdaily. Labs down after dose increase found Creatine had returned to baseline.   She reports good compliance with treatment. She is not having side effects.  She is following a Regular diet. She is not exercising. She does not smoke.  Use of agents associated with hypertension: none.   Outside blood pressures are not checked. Symptoms: No chest pain No chest pressure  No palpitations No syncope  No dyspnea No orthopnea  No paroxysmal nocturnal dyspnea No lower extremity edema   Pertinent labs: Lab Results  Component Value Date   CHOL 164 09/30/2019   HDL 41 09/30/2019   LDLCALC 98 09/30/2019   TRIG 140 09/30/2019   CHOLHDL 4.0 09/30/2019   Lab Results  Component Value Date   NA 137 05/07/2020   K 3.9 05/07/2020   CREATININE 1.85 (H) 05/07/2020   GFRNONAA 28 (L) 05/07/2020   GFRAA 35 (L) 02/16/2020   GLUCOSE 170 (H) 05/07/2020     The 10-year ASCVD risk score (GMikey BussingC Jr., et al., 2013) is: 49.7%   ---------------------------------------------------------------------------------------------------  She again admits today that she she does not keep her legs elevated most of the time, and has stopped taking furosemide due to extreme diuresis and incontinence. However she does states  that she feels really good lately.      Medications: Outpatient Medications Prior to Visit  Medication Sig  . albuterol (VENTOLIN HFA) 108 (90 Base) MCG/ACT inhaler INHALE 1-2 PUFFS INTO THE LUNGS EVERY 6 (SIX) HOURS AS NEEDED FOR SHORTNESS OF BREATH.  Marland Kitchen amLODipine (NORVASC) 5 MG tablet Take 1 tablet (5 mg total) by mouth daily.  . Cholecalciferol (VITAMIN D) 50 MCG (2000 UT) tablet Take 2,000 Units by mouth daily.  . cyanocobalamin 500 MCG tablet Take 250 mcg by mouth daily.   Marland Kitchen HYDROcodone-acetaminophen  (NORCO) 10-325 MG tablet Take 1 tablet by mouth every 6 (six) hours as needed.  . levalbuterol (XOPENEX) 1.25 MG/3ML nebulizer solution USE 1 VIAL IN NEBULIZER EVERY 8 HOURS AS NEEDED FOR WHEEZING  . lisinopril (ZESTRIL) 10 MG tablet Take 1 tablet (10 mg total) by mouth daily.  . metoprolol tartrate (LOPRESSOR) 50 MG tablet Take 1.5 tablets (75 mg total) by mouth 2 (two) times daily.  Marland Kitchen oxybutynin (DITROPAN) 5 MG tablet Take 1 tablet (5 mg total) by mouth 2 (two) times daily.   No facility-administered medications prior to visit.    Review of Systems  Constitutional: Negative for appetite change, chills, fatigue and fever.  Respiratory: Negative for chest tightness and shortness of breath.   Cardiovascular: Positive for leg swelling. Negative for chest pain and palpitations.  Gastrointestinal: Negative for abdominal pain, nausea and vomiting.  Neurological: Negative for dizziness and weakness.       Objective    BP (!) 150/81 (BP Location: Left Arm, Patient Position: Sitting, Cuff Size: Normal)   Pulse (!) 52   Temp (!) 97 F (36.1 C) (Temporal)   Resp 18     Physical Exam    General: Appearance:    Obese female sitting comfortably in wheelchair in no acute distress  Eyes:    PERRL, conjunctiva/corneas clear, EOM's intact       Lungs:     Clear to auscultation bilaterally, respirations unlabored  Heart:    Bradycardic. Normal rhythm.  2/6 harsh, crescendo-decrescendo, systolic murmur at right upper sternal border. 3-4+ bilateral edema of feet and lower legs. No open sores.slightly erythematous anteriorly. .   MS:   All extremities are intact.   Neurologic:   Awake, alert, oriented x 2. No apparent focal neurological           defect.        Results for orders placed or performed in visit on 05/24/20  POCT HgB A1C  Result Value Ref Range   Hemoglobin A1C 7.1 (A) 4.0 - 5.6 %   Est. average glucose Bld gHb Est-mCnc 157     Assessment & Plan     1. Type 2 diabetes mellitus  with stage 4 chronic kidney disease, without long-term current use of insulin (HCC) Stable A1c.   2. Chronic HFrEF (heart failure with reduced ejection fraction) (HCC) Stable. Still with moderate edema and non-compliant with furosemide as well as leg elevation. Reinforced importance of leg elevation at all possible times. She continues to refuse furosemide due to urinary incontinence. She is back on 60m lisinopril and eGFR seems to be improving. Would likely benefit from SGLT-2 inhibitor but she and her husband are insistent that they don't want to add any additional medications at this time. eGFR is slightly low to initiate anyway. Will discuss further at follow up.   3. Lymphedema   4. Chronic venous insufficiency   5. CKD (chronic kidney disease), stage IV [place] Seems to  be stabilizing. Continue ACEI.    Future Appointments  Date Time Provider [place]  07/14/2020 10:30 AM McGowan, Larene Beach A, PA-C [facility] None  07/30/2020  1:30 PM Marrianne Mood D, PA-C [id number]  10/04/2020 11:00 AM Birdie Sons, MD [facility]  02/03/2021 10:40 AM BFP-NURSE HEALTH ADVISOR [facility]        The entirety of the information documented in the History of Present Illness, Review of Systems and Physical Exam were personally obtained by me. Portions of this information were initially documented by the CMA and reviewed by me for thoroughness and accuracy.      Lelon Huh, MD  [facility] [phone] (phone) [phone] (fax)  [place]

## 2020-05-24 NOTE — Patient Instructions (Signed)
Keep your legs elevated above he level of your waist at all times.

## 2020-06-17 ENCOUNTER — Encounter: Payer: Self-pay | Admitting: Hematology and Oncology

## 2020-07-13 NOTE — Progress Notes (Signed)
07/14/2020 1:33 PM   [name] Nov 20, 1945 [id number]  Referring provider: Birdie Sons, MD [address] [place],  [place] [address]  Urological history: 1. Urge Incontinence - Vesicare effective, but cost prohibitive - Detrol effective, but costs prohibitive - oxybutynin 5 mg BID  2. High risk hematuria - non-smoker - Mar 2018 CT / PET showed a normal GU tract done for MM. She underwent renal US 03/06/2018 which was benign. She underwent cystoscopy for urgency and microscopic hematuria June 07, 2018.  She had an ulcerative erythematous area with cloudy urine on the right bladder wall.  She took nightly doxycycline and repeat cystoscopy was normal Jul 2020 - No reports of gross hematuria - UA 3-10 RBC's  Chief Complaint  Patient presents with   Urinary Incontinence     HPI: [name] is a 75 y.o. female who presents today for 6 month follow up with her husband, [name].     She is experiencing 8 or more daytime voids, 3 or more nighttime voids she has a mild urge to urinate.  She does have urinary leakage having 1-2 incontinent episodes daily.  She is always wearing absorbent pads for urinary leakage.  She is unsure how many pads she goes through the day.  She is not limiting fluid intake so she does not have to urinate as often.  And she does engage in some toilet mapping.  Her husband is her sole caretaker and as she is mostly nonambulatory, he has to transfer her to the wheelchair and transfer her to the toilet which is becoming quite difficult for him as he is 75 years of age.  Patient denies any modifying or aggravating factors.  Patient denies any gross hematuria, dysuria or suprapubic/flank pain.  Patient denies any fevers, chills, nausea or vomiting.     PMH: Past Medical History:  Diagnosis Date   Aortic stenosis    Asthma    Coronary artery disease    Diabetes mellitus without complication ([place])    History of measles    Hypertension     Nonischemic cardiomyopathy ([place])    Pulmonary hypertension ([place])     Surgical History: Past Surgical History:  Procedure Laterality Date   CARDIAC CATHETERIZATION     Cyst(solitary) of breast:removed     RIGHT/LEFT HEART CATH AND CORONARY ANGIOGRAPHY N/A 03/07/2016   Procedure: Right/Left Heart Cath and Coronary Angiography;  Surgeon: Nelva Bush, MD;  Location: [facility];  Service: Cardiovascular;  Laterality: N/A;   TUBAL LIGATION      Home Medications:  Allergies as of 07/14/2020       Reactions   Hydrochlorothiazide Other (See Comments)   Leg Cramps   Penicillins Rash        Medication List        Accurate as of July 14, 2020  1:33 PM. If you have any questions, ask your nurse or doctor.          albuterol 108 (90 Base) MCG/ACT inhaler Commonly known as: VENTOLIN HFA INHALE 1-2 PUFFS INTO THE LUNGS EVERY 6 (SIX) HOURS AS NEEDED FOR SHORTNESS OF BREATH.   amLODipine 5 MG tablet Commonly known as: NORVASC Take 1 tablet (5 mg total) by mouth daily.   cephALEXin 500 MG capsule Commonly known as: KEFLEX Take 1 capsule (500 mg total) by mouth 2 (two) times daily. Started by: Zara Council, PA-C   Gemtesa 75 MG Tabs Generic drug: Vibegron Take 75 mg by mouth  daily. Started by: Zara Council, PA-C   HYDROcodone-acetaminophen 10-325 MG tablet Commonly known as: NORCO Take 1 tablet by mouth every 6 (six) hours as needed.   levalbuterol 1.25 MG/3ML nebulizer solution Commonly known as: XOPENEX USE 1 VIAL IN NEBULIZER EVERY 8 HOURS AS NEEDED FOR WHEEZING   lisinopril 10 MG tablet Commonly known as: ZESTRIL Take 1 tablet (10 mg total) by mouth daily.   metoprolol tartrate 50 MG tablet Commonly known as: LOPRESSOR Take 1.5 tablets (75 mg total) by mouth 2 (two) times daily.   oxybutynin 5 MG tablet Commonly known as: DITROPAN Take 1 tablet (5 mg total) by mouth 2 (two) times daily.   vitamin B-12 500 MCG tablet Commonly known as:  CYANOCOBALAMIN Take 250 mcg by mouth daily.   Vitamin D 50 MCG (2000 UT) tablet Take 2,000 Units by mouth daily.        Allergies:  Allergies  Allergen Reactions   Hydrochlorothiazide Other (See Comments)    Leg Cramps   Penicillins Rash    Family History: Family History  Problem Relation Age of Onset   Cancer Mother        Uterine cancer   Diabetes Mother    Heart disease Mother    Heart failure Mother    Parkinson's disease Father    Cancer Sister        breast cancer   Diabetes Brother        Non-insulin Dependent Diabetes Mellitus   Mesothelioma Brother    Hypertension Sister    Diabetes Sister        Non-insulin dependent Diabetes Mellitus    Social History:  reports that she has never smoked. She has never used smokeless tobacco. She reports that she does not drink alcohol and does not use drugs.  ROS: Pertinent ROS in HPI  Physical Exam: BP 132/74   Pulse 67   Ht 5\' 4"  (1.626 m)   Wt 200 lb (90.7 kg)   BMI 34.33 kg/m   Constitutional:  Well nourished. Alert and oriented, No acute distress. HEENT: [place] AT, mask in place.  Trachea midline Cardiovascular: No clubbing, cyanosis, or edema. Respiratory: Normal respiratory effort, no increased work of breathing. GU: No CVA tenderness.  No bladder fullness or masses.  Atrophic external genitalia, sparse pubic hair distribution, no lesions.  Normal urethral meatus, no lesions, no prolapse, no discharge.   No urethral masses, tenderness and/or tenderness. No bladder fullness, tenderness or masses. Pale vagina mucosa, fair estrogen effect.    Neurologic: Grossly intact, no focal deficits, moving all 4 extremities.  Diabetic ulcers on both lower legs.  Psychiatric: Normal mood and affect.    Laboratory Data: Lab Results  Component Value Date   CREATININE 1.85 (H) 05/07/2020    Lab Results  Component Value Date   HGBA1C 7.1 (A) 05/24/2020    Urinalysis Component     Latest Ref Rng & Units 07/14/2020   Specific Gravity, UA     1.005 - 1.030 1.025  pH, UA     5.0 - 7.5 7.5  Color, UA     Yellow Yellow  Appearance Ur     Clear Hazy (A)  Leukocytes,UA     Negative 1+ (A)  Protein,UA     Negative/Trace 3+ (A)  Glucose, UA     Negative Negative  Ketones, UA     Negative Negative  RBC, UA     Negative Trace (A)  Bilirubin, UA     Negative Negative  Urobilinogen, Ur     0.2 - 1.0 mg/dL 0.2  Nitrite, UA     Negative Negative  Microscopic Examination      See below:   Component     Latest Ref Rng & Units 07/14/2020  WBC, UA     0 - 5 /hpf 11-30 (A)  RBC     0 - 2 /hpf 3-10 (A)  Epithelial Cells (non renal)     0 - 10 /hpf 0-10  Bacteria, UA     None seen/Few Many (A)   I have reviewed the labs.   Pertinent Imaging: N/A  Procedure In and Out Catheterization Patient is present today for a I & O catheterization due to body habitus. Patient was cleaned and prepped in a sterile fashion with betadine . A 14 FR cath was inserted no complications were noted , 50 ml of urine return was noted, urine was dark, yellow in color and foul smelling.  A clean urine sample was collected for urinalysis and urine culture. Bladder was drained  And catheter was removed with out difficulty.    Performed by: Zara Council, PA-C and Crysta S. Riki Sheer, CMA   Assessment & Plan:    1. Urge incontinence -not at goal with oxybutynin 5 mg BID -add Gemtesa 75 mg daily  -return in one month for OAB questionnaire and PVR -CATH UA grossly infected -urine culture to rule out indolent infection that may be contributing to urgency  -Started on Keflex 500 mg twice daily x10-day, dose is determined based on patient's CKD -will adjust if necessary when urine culture results are available   2. High risk hematuria - work up completed in 07/2018 - no reports of gross hematuria - UA 3-10 RBC's - urine culture pending  Return in about 1 month (around 08/14/2020) for PVR and OAB  questionnaire.  These notes generated with voice recognition software. I apologize for typographical errors.  Zara Council, PA-C  [facility] [address]  [place], [place] [address] [phone]

## 2020-07-14 ENCOUNTER — Encounter: Payer: Self-pay | Admitting: Urology

## 2020-07-14 ENCOUNTER — Ambulatory Visit: Payer: Medicare Other | Admitting: Urology

## 2020-07-14 ENCOUNTER — Other Ambulatory Visit: Payer: Self-pay

## 2020-07-14 VITALS — BP 132/74 | HR 67 | Ht 64.0 in | Wt 200.0 lb

## 2020-07-14 DIAGNOSIS — N3941 Urge incontinence: Secondary | ICD-10-CM | POA: Diagnosis not present

## 2020-07-14 DIAGNOSIS — R319 Hematuria, unspecified: Secondary | ICD-10-CM | POA: Diagnosis not present

## 2020-07-14 LAB — URINALYSIS, COMPLETE
Bilirubin, UA: NEGATIVE
Glucose, UA: NEGATIVE
Ketones, UA: NEGATIVE
Nitrite, UA: NEGATIVE
Specific Gravity, UA: 1.025 (ref 1.005–1.030)
Urobilinogen, Ur: 0.2 mg/dL (ref 0.2–1.0)
pH, UA: 7.5 (ref 5.0–7.5)

## 2020-07-14 LAB — MICROSCOPIC EXAMINATION

## 2020-07-14 MED ORDER — GEMTESA 75 MG PO TABS: 75.0000 mg | ORAL_TABLET | Freq: Every day | ORAL | 0 refills | Status: DC

## 2020-07-14 MED ORDER — OXYBUTYNIN CHLORIDE 5 MG PO TABS: 5.0000 mg | ORAL_TABLET | Freq: Two times a day (BID) | ORAL | 3 refills | Status: DC

## 2020-07-14 MED ORDER — CEPHALEXIN 500 MG PO CAPS: 500.0000 mg | ORAL_CAPSULE | Freq: Two times a day (BID) | ORAL | 0 refills | Status: DC

## 2020-07-16 LAB — CULTURE, URINE COMPREHENSIVE

## 2020-07-26 ENCOUNTER — Ambulatory Visit: Payer: Medicare Other | Admitting: Physician Assistant

## 2020-07-26 ENCOUNTER — Encounter: Payer: Self-pay | Admitting: Physician Assistant

## 2020-07-26 ENCOUNTER — Other Ambulatory Visit: Payer: Self-pay

## 2020-07-26 VITALS — BP 120/78 | HR 64 | Ht 64.0 in | Wt 200.0 lb

## 2020-07-26 DIAGNOSIS — N183 Chronic kidney disease, stage 3 unspecified: Secondary | ICD-10-CM | POA: Diagnosis not present

## 2020-07-26 DIAGNOSIS — I5032 Chronic diastolic (congestive) heart failure: Secondary | ICD-10-CM

## 2020-07-26 DIAGNOSIS — I428 Other cardiomyopathies: Secondary | ICD-10-CM

## 2020-07-26 DIAGNOSIS — I872 Venous insufficiency (chronic) (peripheral): Secondary | ICD-10-CM | POA: Diagnosis not present

## 2020-07-26 DIAGNOSIS — I2510 Atherosclerotic heart disease of native coronary artery without angina pectoris: Secondary | ICD-10-CM

## 2020-07-26 DIAGNOSIS — S81809D Unspecified open wound, unspecified lower leg, subsequent encounter: Secondary | ICD-10-CM

## 2020-07-26 DIAGNOSIS — E1122 Type 2 diabetes mellitus with diabetic chronic kidney disease: Secondary | ICD-10-CM | POA: Diagnosis not present

## 2020-07-26 DIAGNOSIS — N1832 Chronic kidney disease, stage 3b: Secondary | ICD-10-CM

## 2020-07-26 DIAGNOSIS — S81809A Unspecified open wound, unspecified lower leg, initial encounter: Secondary | ICD-10-CM

## 2020-07-26 DIAGNOSIS — I10 Essential (primary) hypertension: Secondary | ICD-10-CM | POA: Diagnosis not present

## 2020-07-26 DIAGNOSIS — I350 Nonrheumatic aortic (valve) stenosis: Secondary | ICD-10-CM

## 2020-07-26 MED ORDER — AMLODIPINE BESYLATE 5 MG PO TABS: 5.0000 mg | ORAL_TABLET | Freq: Every day | ORAL | 0 refills | Status: DC

## 2020-07-26 NOTE — Progress Notes (Signed)
Office Visit    Patient Name: [name] Date of Encounter: 07/26/2020  Primary Care Provider:  Birdie Sons, MD Primary Cardiologist:  Nelva Bush, MD  Chief Complaint    Chief Complaint  Patient presents with   Follow-up    No new cardiac concerns     75 year old female with history of nonobstructive CAD s/p 03/07/2016 L/RHC, NICM, chronic systolic and diastolic heart failure, hypertension, hyperlipidemia, aortic stenosis, DM2, multiple myeloma, and who presents for 3 month follow-up.  Past Medical History    Past Medical History:  Diagnosis Date   Aortic stenosis    Asthma    Coronary artery disease    Diabetes mellitus without complication ([place])    History of measles    Hypertension    Nonischemic cardiomyopathy ([place])    Pulmonary hypertension ([place])    Past Surgical History:  Procedure Laterality Date   CARDIAC CATHETERIZATION     Cyst(solitary) of breast:removed     RIGHT/LEFT HEART CATH AND CORONARY ANGIOGRAPHY N/A 03/07/2016   Procedure: Right/Left Heart Cath and Coronary Angiography;  Surgeon: Nelva Bush, MD;  Location: [facility];  Service: Cardiovascular;  Laterality: N/A;   TUBAL LIGATION      Allergies  Allergies  Allergen Reactions   Hydrochlorothiazide Other (See Comments)    Leg Cramps   Penicillins Rash    History of Present Illness    [name] is a 75 y.o. female with PMH as above.    She has a history of nonobstructive CAD and chronic systolic and diastolic heart failure secondary to nonischemic cardiomyopathy.  She underwent LHC/RHC 03/07/2016 as below that showed nonobstructive disease.  TTE 04/18/2019 showed EF 60 to 65% with G1DD, very mild AS, and mild pulmonary hypertension.  At previous visits, it has been noted she has history of LEE and LE wounds/cellulitis, often seen by the wound care center and sometimes started on abx.  It has been noted she will sometimes discontinue her lasix due to urinary frequency.  At one visit, it was noted she had discontinued all of her medications with restart encouraged by PCP and husband. Lovastatin held in the past for leg cramping without relief.  It was thought the cramping was thus due to LEE. She is very sedentary, unable to do most ADLs without assistance. Some of her edema is attributed to having her legs in the dependent position for much of the day. Due to Cr,  she has been advised to continue avoiding nephrotoxic agents in the past, especially NSAIDs.  She was last seen in clinic 04/28/20 by Dr. Saunders Revel after recommendation to restart lasix 86m on M, W, F. She continued to have LEE though improving with resolution of leg wounds. No weeping reported. She had not been taking her lasix due to urinary frequency.  She remained sedentary with legs in a dependent position for most of the day. Leg elevation discussed. Lasix restarted. Lisinopril increased to 131mdaily. It was noted nephrology consultation could be helpful.  Today 07/26/20, she returns to clinic and reports ongoing LEE worse from previous visits. She is unable to stand on the scale to obtain a wt. She reports medication compliance. Bilateral legs are covered with ulcers at various stages, some with scabbing and weeping noted. Erythema also noted with clear demarcation of erythema, suspicious for cellulitis. She reports she jut finished another round of Keflex.  She denies any CP and reports breathing is stable from previous visits. No s/sx of  bleeding.   Home Medications    Prior to Admission medications   Medication Sig Start Date End Date Taking? Authorizing Provider  aspirin 325 MG tablet Take 325 mg by mouth every 6 (six) hours as needed (pain).   Yes [provider]  aspirin EC 81 MG tablet Take 1 tablet (81 mg total) by mouth daily. 03/02/16  Yes Wende Bushy, MD  Cholecalciferol (VITAMIN D) 50 MCG (2000 UT) tablet Take 2,000 Units by mouth daily.   Yes [provider]  cyanocobalamin 500  MCG tablet Take 250 mcg by mouth daily.    Yes [provider]  furosemide (LASIX) 40 MG tablet Take 1 tablet (40 mg total) by mouth daily. 05/23/19  Yes End, Harrell Gave, MD  HYDROcodone-acetaminophen (NORCO) 10-325 MG tablet Take 1 tablet by mouth every 8 (eight) hours as needed. 05/31/18  Yes Birdie Sons, MD  lisinopril (ZESTRIL) 20 MG tablet Take 20 mg by mouth daily.   Yes [provider]  metoprolol tartrate (LOPRESSOR) 50 MG tablet Take 1.5 tablets (75 mg total) by mouth 2 (two) times daily. 06/23/19  Yes Marrianne Mood D, PA-C  oxybutynin (DITROPAN) 5 MG tablet Take 1 tablet (5 mg total) by mouth 2 (two) times daily. 12/18/18  Yes Festus Aloe, MD    Review of Systems    She denies chest pain, palpitations, pnd, increased orthopnea, n, v, dizziness, syncope, early satiety.  She reports unchanged SOB/DOE with minimal exertion and ongoing lower extremity edema though recently released by the  wound care clinic. She reports bilateral LE weeping and scabbing.  All other systems reviewed and are otherwise negative except as noted above.  Physical Exam    VS:  BP 120/78 (BP Location: Right Arm, Patient Position: Sitting, Cuff Size: Large)   Pulse 64   Ht _0  (1.626 m)   Wt 200 lb (90.7 kg) Comment: patient reported-unable to stand on scale  SpO2 97%   BMI 34.33 kg/m  , BMI Body mass index is 34.33 kg/m. GEN: Well nourished, well developed, in no acute distress.  Seated in wheelchair.  Joined by her husband.   HEENT: normal.  Neck: JVD difficult to assess due to position of pt.  No,carotid bruits, or masses. Cardiac: RRR, 1/6 systolic murmur.  No rubs or gallops. No clubbing, cyanosis.  Bilateral lower extremities with bright erythema and ongoing 1-2+ pitting edema noted.  Scabbing and weeping noted. Radials/DP/PT 2+ and equal bilaterally.  Respiratory: Poor inspiratory effort but otherwise clear. GI: Soft, nontender, nondistended, BS + x 4. MS: no deformity  or atrophy. Skin: warm and dry, bilateral LE erythema as above with some weeping on exam and scabbing. Neuro:  Strength and sensation are intact. Psych: Normal affect. Improved energy.  Accessory Clinical Findings    ECG personally reviewed by me today - No EKG - no acute changes from 05/23/19.  VITALS Reviewed today   Temp Readings from Last 3 Encounters:  05/24/20 (!) 97 F (36.1 C) (Temporal)  02/16/20 (!) 97 F (36.1 C) (Temporal)  12/15/19 97.8 F (36.6 C)   BP Readings from Last 3 Encounters:  07/26/20 120/78  07/14/20 132/74  05/24/20 (!) 150/81   Pulse Readings from Last 3 Encounters:  07/26/20 64  07/14/20 67  05/24/20 (!) 52    Wt Readings from Last 3 Encounters:  07/26/20 200 lb (90.7 kg)  07/14/20 200 lb (90.7 kg)  04/28/20 200 lb (90.7 kg)     LABS  reviewed today  Lab Results  Component Value Date   WBC 7.1 02/16/2020   HGB 9.7 (L) 02/16/2020   HCT 32.0 (L) 02/16/2020   MCV 90 02/16/2020   PLT 265 02/16/2020   Lab Results  Component Value Date   CREATININE 1.85 (H) 05/07/2020   BUN 30 (H) 05/07/2020   NA 137 05/07/2020   K 3.9 05/07/2020   CL 104 05/07/2020   CO2 27 05/07/2020   Lab Results  Component Value Date   ALT 7 12/11/2019   AST 11 (L) 12/11/2019   ALKPHOS 79 12/11/2019   BILITOT 0.4 12/11/2019   Lab Results  Component Value Date   CHOL 164 09/30/2019   HDL 41 09/30/2019   LDLCALC 98 09/30/2019   TRIG 140 09/30/2019   CHOLHDL 4.0 09/30/2019    Lab Results  Component Value Date   HGBA1C 7.1 (A) 05/24/2020   Lab Results  Component Value Date   TSH 0.482 05/29/2017     STUDIES/PROCEDURES reviewed today   Echo 04/2019  1. Left ventricular ejection fraction, by estimation, is 60 to 65%. The  left ventricle has normal function. The left ventricle has no regional  wall motion abnormalities. There is mild left ventricular hypertrophy.  Left ventricular diastolic parameters  are consistent with Grade I diastolic  dysfunction (impaired relaxation).   2. Right ventricular systolic function is normal. The right ventricular  size is normal. There is mildly elevated pulmonary artery systolic  pressure.   3. The aortic valve was not well visualized, though grossly there is  significant calcification. Mild aortic valve stenosis.   Cardiovascular History & Procedures: Cardiovascular Problems: Chronic systolic and diastolic heart failure secondary to non-ischemic cardiomyopathy Nonobstructive coronary artery disease Aortic stenosis   Risk Factors: Known coronary artery disease, hypertension, hyperlipidemia, diabetes mellitus, sedentary lifestyle, obesity, and age > 78   Cath/PCI: LHC/RHC (03/07/16): LMCA normal. LAD with 30% ostial stenosi, 30% midvessel narrowing, and 50% distal disease. LCx with 40% midvessel lesion. RCA with 30% proximal narrowing. LVEDP 26 mmHg. RA 18 (prominent "M" appearance), RV 82/21, PA mean 55, PCWP 25. Ao sat 96%, PA sat 54%, Fick Co/Ci 3.2/1.7. PVR 9.4 Wood units. No significant AoV gradient.   CV Surgery: None   EP Procedures and Devices: None   Non-Invasive Evaluation(s): TTE (04/18/2019): Normal LV size with mild LVH.  LVEF 60-65% with grade 1 diastolic dysfunction.  Normal RV size and function.  Mild pulmonary hypertension.  Very mild aortic stenosis. ABI's (07/26/16): Right: ABI not obtained, TBI 1.8. Left: ABI 1.9, TBI 1.0. Triphasic waveforms noted in both lower extremities. TTE (07/26/16): Normal LV size with moderate LVH. LVEF 55-60% with normal wall motion. Grade 1 diastolic dysfunction. Mild aortic stenosis (mean gradient 11 mmHg). Mitral annular calcification with mild to moderate MR. Mild left atrial enlargement. Normal RV size and function. Normal PA pressure. TTE (02/25/16): Normal LV size with moderate LVH. LVEF 25-30% with grade 2 diastolic dysfunction. Thickened aortic valve with likely moderate stenosis. MAC with mild MR. Mild LA enlargement. Mildly to moderately  reduced RV contraction. Mild-moderate TR. Moderately PH (RVSP 55 mmHg).  Assessment & Plan    Chronic HFrEF with recovered LVEF due to NICM --Reports ongoing / unchanged SOB/DOE with consideration of deconditioning as she is very sedentary. EF nl on 04/2019 echo. Ongoing edema reported and at least in part due to dependent edema. History of noncompliance with diuretic and compliance encouraged today. Continue lopressor and lisinopril 103m daily. Continue current lasix. Salt and fluid restrictions reviewed and  strongly encouraged. Leg elevation recommended. Recommend reach out to PCP or wound clinic regarding recurrent cellulitis / bilateral LE infection, especially as she just completed a round of Keflex.    Lymphedema, recurrent cellulitis --As above, will defer recommendations re abx per PCP / wound care clinic. Just finished a round of Keflex. Suspect this was not adequate to control the infection. Further recommendations per PCP/wound care clinic. She may benefit from referral to the lymphedema clinic in the future.  HTN, goal BP 130/80 or lower --BP significantly improved. Continue lisinopril, lasix, amlodipine, BB. Low salt diet and fluid restriction under 2 L.    Nonobstructive CAD --No sx concerning for worsening cor insufficiency. No indication for further workup at this time.  AS --Very mild on most recent echo 04/2019. Control BP/HR. Continue to monitor sx with repeat echo in 1-2 years.   CKD --Continue to closely monitor and avoid nephrotoxins. Glycemic control recommended.   DM2 --Per PCP.   Medication changes: None. Abx per PCP or wound care. Labs ordered: None Studies / Imaging ordered: None  Future considerations: ?Nephrology, reach out to PCP re abx. Disposition: RTC 2 months     Arvil Chaco, PA-C 07/26/2020

## 2020-07-26 NOTE — Patient Instructions (Signed)
Medication Instructions:  Your physician recommends that you continue on your current medications as directed. Please refer to the Current Medication list given to you today.  *If you need a refill on your cardiac medications before your next appointment, please call your pharmacy*   Lab Work: None ordered If you have labs (blood work) drawn today and your tests are completely normal, you will receive your results only by: [place] (if you have MyChart) OR A paper copy in the mail If you have any lab test that is abnormal or we need to change your treatment, we will call you to review the results.   Testing/Procedures: None ordered   Follow-Up: At [facility], you and your health needs are our priority.  As part of our continuing mission to provide you with exceptional heart care, we have created designated Provider Care Teams.  These Care Teams include your primary Cardiologist (physician) and Advanced Practice Providers (APPs -  Physician Assistants and Nurse Practitioners) who all work together to provide you with the care you need, when you need it.  We recommend signing up for the patient portal called "MyChart".  Sign up information is provided on this After Visit Summary.  MyChart is used to connect with patients for Virtual Visits (Telemedicine).  Patients are able to view lab/test results, encounter notes, upcoming appointments, etc.  Non-urgent messages can be sent to your provider as well.   To learn more about what you can do with MyChart, go to [email or web address].    Your next appointment:   In November   The format for your next appointment:   In Person  Provider:   You may see Nelva Bush, MD or one of the following Advanced Practice Providers on your designated Care Team:   Murray Hodgkins, NP Christell Faith, PA-C Marrianne Mood, PA-C Cadence Kathlen Mody, [place]   Other Instructions

## 2020-07-30 ENCOUNTER — Ambulatory Visit: Payer: Medicare Other | Admitting: Physician Assistant

## 2020-08-24 NOTE — Progress Notes (Signed)
08/25/2020 2:58 PM   [name] 1945-01-07 [id number]  Referring provider: Birdie Sons, MD [address] [place],  [place] [address]  Urological history: 1. Urge Incontinence - Vesicare effective, but cost prohibitive - Detrol effective, but costs prohibitive - oxybutynin 5 mg BID  2. High risk hematuria - non-smoker - Mar 2018 CT / PET showed a normal GU tract done for MM. She underwent renal US 03/06/2018 which was benign. She underwent cystoscopy for urgency and microscopic hematuria June 07, 2018.  She had an ulcerative erythematous area with cloudy urine on the right bladder wall.  She took nightly doxycycline and repeat cystoscopy was normal Jul 2020 - No reports of gross hematuria - UA neg for micro heme  Chief Complaint  Patient presents with   Urinary Incontinence    HPI: [name] is a 75 y.o. female who presents today for 6 month follow up with her husband, [name].     The states the samples of [name] worked wonders, but when they went to [employer] stated it would not cover the medication.  Patient denies any modifying or aggravating factors.  Patient denies any gross hematuria, dysuria or suprapubic/flank pain.  Patient denies any fevers, chills, nausea or vomiting.    UA >30 WBC's and many bacteria.    PMH: Past Medical History:  Diagnosis Date   Aortic stenosis    Asthma    Coronary artery disease    Diabetes mellitus without complication ([place])    History of measles    Hypertension    Nonischemic cardiomyopathy ([place])    Pulmonary hypertension ([place])     Surgical History: Past Surgical History:  Procedure Laterality Date   CARDIAC CATHETERIZATION     Cyst(solitary) of breast:removed     RIGHT/LEFT HEART CATH AND CORONARY ANGIOGRAPHY N/A 03/07/2016   Procedure: Right/Left Heart Cath and Coronary Angiography;  Surgeon: Jillian Bush, MD;  Location: [facility];  Service: Cardiovascular;  Laterality: N/A;    TUBAL LIGATION      Home Medications:  Allergies as of 08/25/2020       Reactions   Hydrochlorothiazide Other (See Comments)   Leg Cramps   Penicillins Rash        Medication List        Accurate as of August 25, 2020  2:58 PM. If you have any questions, ask your nurse or doctor.          albuterol 108 (90 Base) MCG/ACT inhaler Commonly known as: VENTOLIN HFA INHALE 1-2 PUFFS INTO THE LUNGS EVERY 6 (SIX) HOURS AS NEEDED FOR SHORTNESS OF BREATH.   amLODipine 5 MG tablet Commonly known as: NORVASC Take 1 tablet (5 mg total) by mouth daily.   Gemtesa 75 MG Tabs Generic drug: Vibegron Take 75 mg by mouth daily. What changed: Another medication with the same name was added. Make sure you understand how and when to take each. Changed by: Jillian Council, PA-C   Gemtesa 75 MG Tabs Generic drug: Vibegron Take 75 mg by mouth daily. What changed: You were already taking a medication with the same name, and this prescription was added. Make sure you understand how and when to take each. Changed by: Jillian Council, PA-C   HYDROcodone-acetaminophen 10-325 MG tablet Commonly known as: NORCO Take 1 tablet by mouth every 6 (six) hours as needed.   levalbuterol 1.25 MG/3ML nebulizer solution Commonly known as: XOPENEX USE 1 VIAL IN NEBULIZER EVERY 8 HOURS AS  NEEDED FOR WHEEZING   lisinopril 10 MG tablet Commonly known as: ZESTRIL Take 1 tablet (10 mg total) by mouth daily.   metoprolol tartrate 50 MG tablet Commonly known as: LOPRESSOR Take 1.5 tablets (75 mg total) by mouth 2 (two) times daily.   oxybutynin 5 MG tablet Commonly known as: DITROPAN Take 1 tablet (5 mg total) by mouth 2 (two) times daily.   vitamin B-12 500 MCG tablet Commonly known as: CYANOCOBALAMIN Take 250 mcg by mouth daily.   Vitamin D 50 MCG (2000 UT) tablet Take 2,000 Units by mouth daily.        Allergies:  Allergies  Allergen Reactions   Hydrochlorothiazide Other (See Comments)     Leg Cramps   Penicillins Rash    Family History: Family History  Problem Relation Age of Onset   Cancer Mother        Uterine cancer   Diabetes Mother    Heart disease Mother    Heart failure Mother    Parkinson's disease Father    Cancer Sister        breast cancer   Hypertension Sister    Diabetes Sister        Non-insulin dependent Diabetes Mellitus   Diabetes Brother        Non-insulin Dependent Diabetes Mellitus   Cancer Brother    Mesothelioma Brother     Social History:  reports that she has never smoked. She has never used smokeless tobacco. She reports that she does not drink alcohol and does not use drugs.  ROS: Pertinent ROS in HPI  Physical Exam: BP (!) 154/79   Pulse 76   Ht 5\' 4"  (1.626 m)   Wt 200 lb (90.7 kg)   BMI 34.33 kg/m   Constitutional:  Well nourished. Alert and oriented, No acute distress. HEENT: [place] AT, moist mucus membranes.  Trachea midline Cardiovascular: No clubbing, cyanosis, or edema. Respiratory: Normal respiratory effort, no increased work of breathing. Neurologic: Grossly intact, no focal deficits, moving all 4 extremities.  In wheelchair Psychiatric: Normal mood and affect.    Laboratory Data: Lab Results  Component Value Date   CREATININE 1.85 (H) 05/07/2020    Lab Results  Component Value Date   HGBA1C 7.1 (A) 05/24/2020    Urinalysis Component     Latest Ref Rng & Units 08/25/2020  Specific Gravity, UA     1.005 - 1.030 1.020  pH, UA     5.0 - 7.5 7.0  Color, UA     Yellow Yellow  Appearance Ur     Clear Cloudy (A)  Leukocytes,UA     Negative 1+ (A)  Protein,UA     Negative/Trace 3+ (A)  Glucose, UA     Negative Negative  Ketones, UA     Negative Negative  RBC, UA     Negative 1+ (A)  Bilirubin, UA     Negative Negative  Urobilinogen, Ur     0.2 - 1.0 mg/dL 0.2  Nitrite, UA     Negative Negative  Microscopic Examination      See below:   Component     Latest Ref Rng & Units 08/25/2020  WBC,  UA     0 - 5 /hpf >30 (H)  RBC     0 - 2 /hpf None seen  Epithelial Cells (non renal)     0 - 10 /hpf 0-10  Bacteria, UA     None seen/Few Many (A)  I have reviewed the labs.  Pertinent Imaging: Results for Jillian, Watson (MRN [id number]) as of 08/25/2020 13:04  Ref. Range 08/25/2020 11:47  Scan Result Unknown [id number]    Assessment & Plan:    1. Urge incontinence -[name] is very effective, but according to their website insurance would not cover the medication -I have sent a prescription in for the [place] to the pharmacy to generate a rejection letter so that I may get more information on how to appeal the insurance company so that they can take this medication and get it covered  2. High risk hematuria - work up completed in 07/2018 - no reports of gross hematuria - UA neg for micro heme  Return if symptoms worsen or fail to improve.  These notes generated with voice recognition software. I apologize for typographical errors.  Jillian Council, PA-C  [facility] [address]  [place], [place] [address] [phone]

## 2020-08-25 ENCOUNTER — Other Ambulatory Visit: Payer: Self-pay

## 2020-08-25 ENCOUNTER — Other Ambulatory Visit: Payer: Self-pay | Admitting: Urology

## 2020-08-25 ENCOUNTER — Ambulatory Visit: Payer: Medicare Other | Admitting: Urology

## 2020-08-25 ENCOUNTER — Encounter: Payer: Self-pay | Admitting: Urology

## 2020-08-25 VITALS — BP 154/79 | HR 76 | Ht 64.0 in | Wt 200.0 lb

## 2020-08-25 DIAGNOSIS — N3941 Urge incontinence: Secondary | ICD-10-CM

## 2020-08-25 DIAGNOSIS — R319 Hematuria, unspecified: Secondary | ICD-10-CM | POA: Diagnosis not present

## 2020-08-25 LAB — URINALYSIS, COMPLETE
Bilirubin, UA: NEGATIVE
Glucose, UA: NEGATIVE
Ketones, UA: NEGATIVE
Nitrite, UA: NEGATIVE
Specific Gravity, UA: 1.02 (ref 1.005–1.030)
Urobilinogen, Ur: 0.2 mg/dL (ref 0.2–1.0)
pH, UA: 7 (ref 5.0–7.5)

## 2020-08-25 LAB — MICROSCOPIC EXAMINATION
RBC, Urine: NONE SEEN /hpf (ref 0–2)
WBC, UA: >30 /hpf — ABNORMAL HIGH (ref 0–5)

## 2020-08-25 LAB — BLADDER SCAN AMB NON-IMAGING: Scan Result: 184

## 2020-08-25 MED ORDER — GEMTESA 75 MG PO TABS: 75.0000 mg | ORAL_TABLET | Freq: Every day | ORAL | 0 refills | Status: DC

## 2020-08-25 NOTE — Telephone Encounter (Signed)
I would like the pharmacy to send [place] the letter stating that it is not covered.  It is the only medicine that works for her and I need to see if I can write a letter of medical necessity, a formulary exception or tier reduction letter to get it covered for her.

## 2020-08-31 ENCOUNTER — Other Ambulatory Visit: Payer: Self-pay | Admitting: Internal Medicine

## 2020-08-31 ENCOUNTER — Other Ambulatory Visit: Payer: Self-pay | Admitting: *Deleted

## 2020-08-31 ENCOUNTER — Telehealth: Payer: Self-pay | Admitting: Urology

## 2020-08-31 MED ORDER — METOPROLOL TARTRATE 50 MG PO TABS: 75.0000 mg | ORAL_TABLET | Freq: Two times a day (BID) | ORAL | 0 refills | Status: DC

## 2020-08-31 NOTE — Telephone Encounter (Signed)
Performed a prior authorization request over the phone with her insurance company.  They will fax [place] their decision.  This is regarding the Gemtesa.

## 2020-09-23 ENCOUNTER — Other Ambulatory Visit: Payer: Self-pay | Admitting: Internal Medicine

## 2020-10-04 ENCOUNTER — Ambulatory Visit: Payer: Medicare Other | Admitting: Family Medicine

## 2020-10-05 ENCOUNTER — Ambulatory Visit (INDEPENDENT_AMBULATORY_CARE_PROVIDER_SITE_OTHER): Payer: Medicare Other | Admitting: Family Medicine

## 2020-10-05 ENCOUNTER — Encounter: Payer: Self-pay | Admitting: Family Medicine

## 2020-10-05 ENCOUNTER — Other Ambulatory Visit: Payer: Self-pay

## 2020-10-05 VITALS — BP 158/78 | HR 63 | Temp 98.2°F | Resp 20

## 2020-10-05 DIAGNOSIS — I10 Essential (primary) hypertension: Secondary | ICD-10-CM | POA: Diagnosis not present

## 2020-10-05 DIAGNOSIS — I890 Lymphedema, not elsewhere classified: Secondary | ICD-10-CM

## 2020-10-05 DIAGNOSIS — R0609 Other forms of dyspnea: Secondary | ICD-10-CM

## 2020-10-05 DIAGNOSIS — E538 Deficiency of other specified B group vitamins: Secondary | ICD-10-CM | POA: Diagnosis not present

## 2020-10-05 DIAGNOSIS — N184 Chronic kidney disease, stage 4 (severe): Secondary | ICD-10-CM | POA: Diagnosis not present

## 2020-10-05 DIAGNOSIS — I5042 Chronic combined systolic (congestive) and diastolic (congestive) heart failure: Secondary | ICD-10-CM

## 2020-10-05 DIAGNOSIS — J4520 Mild intermittent asthma, uncomplicated: Secondary | ICD-10-CM | POA: Diagnosis not present

## 2020-10-05 DIAGNOSIS — E559 Vitamin D deficiency, unspecified: Secondary | ICD-10-CM | POA: Diagnosis not present

## 2020-10-05 DIAGNOSIS — I700 Atherosclerosis of aorta: Secondary | ICD-10-CM | POA: Diagnosis not present

## 2020-10-05 DIAGNOSIS — E1122 Type 2 diabetes mellitus with diabetic chronic kidney disease: Secondary | ICD-10-CM | POA: Diagnosis not present

## 2020-10-05 LAB — POCT GLYCOSYLATED HEMOGLOBIN (HGB A1C)
Est. average glucose Bld gHb Est-mCnc: 137
Hemoglobin A1C: 6.4 % — AB (ref 4.0–5.6)

## 2020-10-05 MED ORDER — TRELEGY ELLIPTA 200-62.5-25 MCG/INH IN AEPB: 1.0000 | INHALATION_SPRAY | Freq: Every day | RESPIRATORY_TRACT | 0 refills | Status: AC

## 2020-10-05 NOTE — Progress Notes (Signed)
Established patient visit   Patient: [name]   DOB: 03-Apr-1945   75 y.o. Female  MRN: [id number] Visit Date: 10/05/2020  Today's healthcare provider: Lelon Huh, MD   Chief Complaint  Patient presents with   Diabetes   Congestive Heart Failure   Chronic Kidney Disease   Subjective    HPI  Diabetes Mellitus Type II, Follow-up  Lab Results  Component Value Date   HGBA1C 6.4 (A) 10/05/2020   HGBA1C 7.1 (A) 05/24/2020   HGBA1C 7.1 (A) 02/16/2020   Wt Readings from Last 3 Encounters:  08/25/20 200 lb (90.7 kg)  07/26/20 200 lb (90.7 kg)  07/14/20 200 lb (90.7 kg)   Last seen for diabetes 3 months ago.  Management since then includes continue same medication. She reports good compliance with treatment. She is not having side effects.  Symptoms: No fatigue No foot ulcerations  No appetite changes No nausea  No paresthesia of the feet  No polydipsia  No polyuria No visual disturbances   No vomiting     Home blood sugar records:  blood sugars are not checked  Episodes of hypoglycemia? No    Current insulin regiment: none Most Recent Eye Exam: 10/08/2019 Current exercise: none Current diet habits: in general, an "unhealthy" diet  Pertinent Labs: Lab Results  Component Value Date   CHOL 164 09/30/2019   HDL 41 09/30/2019   LDLCALC 98 09/30/2019   TRIG 140 09/30/2019   CHOLHDL 4.0 09/30/2019   Lab Results  Component Value Date   NA 137 05/07/2020   K 3.9 05/07/2020   CREATININE 1.85 (H) 05/07/2020   EGFR 25 (L) 04/28/2020   GFRNONAA 28 (L) 05/07/2020   GLUCOSE 170 (H) 05/07/2020     ---------------------------------------------------------------------------------------------------   Follow up for chronic heart failure:  The patient was last seen for this 3 months ago. Changes made at last visit include none.  She reports good compliance with treatment. She feels that condition is Unchanged. She is not having side effects.    -----------------------------------------------------------------------------------------   Follow up for CKD:  The patient was last seen for this 3 months ago. Changes made at last visit include none; continue ACE I.  She reports good compliance with treatment. She feels that condition is Unchanged. She is not having side effects.   -----------------------------------------------------------------------------------------  She also reports she has had more wheezing and shortness of breath lately. She has had intermittent asthma for many years previously on maintenance inhalers, although her asthma sx previously improved when put in diuretics for CHF.     Medications: Outpatient Medications Prior to Visit  Medication Sig   amLODipine (NORVASC) 5 MG tablet Take 1 tablet (5 mg total) by mouth daily.   cyanocobalamin 500 MCG tablet Take 250 mcg by mouth daily.    GEMTESA 75 MG TABS TAKE 1 TABLET BY MOUTH DAILY   lisinopril (ZESTRIL) 10 MG tablet TAKE 1 TABLET BY MOUTH EVERY DAY--DOSE INCREASE   metoprolol tartrate (LOPRESSOR) 50 MG tablet TAKE 1 AND 1/2 TABLETS BY MOUTH 2 TIMES DAILY.   oxybutynin (DITROPAN) 5 MG tablet Take 1 tablet (5 mg total) by mouth 2 (two) times daily.   albuterol (VENTOLIN HFA) 108 (90 Base) MCG/ACT inhaler INHALE 1-2 PUFFS INTO THE LUNGS EVERY 6 (SIX) HOURS AS NEEDED FOR SHORTNESS OF BREATH. (Patient not taking: Reported on 10/05/2020)   Cholecalciferol (VITAMIN D) 50 MCG (2000 UT) tablet Take 2,000 Units by mouth daily. (Patient not taking: Reported on 10/05/2020)  HYDROcodone-acetaminophen (NORCO) 10-325 MG tablet Take 1 tablet by mouth every 6 (six) hours as needed. (Patient not taking: Reported on 10/05/2020)   levalbuterol (XOPENEX) 1.25 MG/3ML nebulizer solution USE 1 VIAL IN NEBULIZER EVERY 8 HOURS AS NEEDED FOR WHEEZING (Patient not taking: Reported on 10/05/2020)   Vibegron (GEMTESA) 75 MG TABS Take 75 mg by mouth daily. (Patient not taking: Reported on  10/05/2020)   No facility-administered medications prior to visit.    Review of Systems  Constitutional:  Positive for fatigue. Negative for appetite change, chills and fever.  Respiratory:  Positive for shortness of breath and wheezing. Negative for chest tightness.   Cardiovascular:  Positive for leg swelling. Negative for chest pain and palpitations.  Gastrointestinal:  Negative for abdominal pain, nausea and vomiting.  Musculoskeletal:  Positive for arthralgias (knee pain).  Skin:  Positive for color change (redness in legs) and wound.       Dry flaky skin on legs  Neurological:  Negative for dizziness and weakness.  Hematological:  Bruises/bleeds easily (right shoulder).      Objective    BP (!) 158/78 (BP Location: Right Arm, Patient Position: Sitting, Cuff Size: Large)   Pulse 63   Temp 98.2 F (36.8 C) (Oral)   Resp 20   SpO2 99% Comment: room air {Show previous vital signs (optional):23777}  Physical Exam    General: Appearance:    Mildly obese female in no acute distress  Eyes:    PERRL, conjunctiva/corneas clear, EOM's intact       Lungs:     Clear to auscultation bilaterally, respirations unlabored  Heart:    Normal heart rate. Normal rhythm.  2/6 harsh, crescendo-decrescendo, systolic murmur at right upper sternal border   MS:   All extremities are intact.  3+ bilateral lower leg and pedal edema. No open wounds.   Neurologic:   Awake, alert, oriented x 3. No apparent focal neurological defect.         Results for orders placed or performed in visit on 10/05/20  POCT HgB A1C  Result Value Ref Range   Hemoglobin A1C 6.4 (A) 4.0 - 5.6 %   Est. average glucose Bld gHb Est-mCnc 137     Assessment & Plan     1. Type 2 diabetes mellitus with stage 4 chronic kidney disease, without long-term current use of insulin (HCC) Well controlled.  Continue current medications.    2. CKD (chronic kidney disease), stage IV (HCC)   - CBC - Comprehensive metabolic  panel  3. Essential hypertension Stable, Continue current medications.    4. B12 deficiency  - Vitamin B12  5. Atherosclerosis of aorta (HCC) Asymptomatic. Compliant with medication.  Continue aggressive risk factor modification.   - Lipid panel  6. Vitamin D deficiency  - VITAMIN D 25 Hydroxy (Vit-D Deficiency, Fractures)  7. Dyspnea on exertion Multifactorial, does have some underlying asthma as below. Has also been related to CHF exacerbations in the past.  - B Nat Peptide  8. Intermittent asthma, uncontrolled  - Fluticasone-Umeclidin-Vilant (TRELEGY ELLIPTA) 200-62.5-25 MCG/INH AEPB; Inhale 1 puff into the lungs daily for 28 days.  Dispense: 2 each; Refill: 0  9. Chronic combined systolic and diastolic congestive heart failure ([place]) Has follow up scheduled with Dr. Saunders Revel in November.   10. Lymphedema Strongly encouraged to keep feet elevated when not ambulating   Future Appointments  Date Time Provider [place]  11/17/2020 10:00 AM End, Harrell Gave, MD [facility]  01/10/2021 11:00 AM Birdie Sons,  MD [facility]  02/03/2021 10:40 AM [facility]-NURSE HEALTH ADVISOR [facility]     She declined flu vaccine today.      The entirety of the information documented in the History of Present Illness, Review of Systems and Physical Exam were personally obtained by me. Portions of this information were initially documented by the CMA and reviewed by me for thoroughness and accuracy.     Lelon Huh, MD  [facility] [phone] (phone) [phone] (fax)  [place]

## 2020-10-07 ENCOUNTER — Telehealth: Payer: Self-pay | Admitting: Family Medicine

## 2020-10-07 NOTE — Telephone Encounter (Signed)
[name], pts daughter, calling on behalf of pt. She states that she is currently having a hard time sleeping and settling due to her husband not being with her and her dementia. She is requesting to speak with PCP or nurse regarding a possible medication to help with this. Please advise.

## 2020-10-19 ENCOUNTER — Telehealth: Payer: Self-pay

## 2020-10-19 DIAGNOSIS — R410 Disorientation, unspecified: Secondary | ICD-10-CM

## 2020-10-19 NOTE — Telephone Encounter (Signed)
No, she has not been diagnosed with dementia or alzheimer's. We can refer her to a neurologist if she has been getting forgetful or more confused.

## 2020-10-19 NOTE — Telephone Encounter (Signed)
Copied from [place] [phone]. Topic: General - Other >> Oct 19, 2020  2:40 PM Leward Quan A wrote: Reason for CRM: Patient daughter Blitzer called in to inquire of Dr Caryn Section needing clarification on if her mom has Dementia or Alzheimer's Disease Please call [place] / Olivia Mackie to discuss  at  Ph# [phone]

## 2020-10-20 ENCOUNTER — Telehealth: Payer: Self-pay

## 2020-10-20 NOTE — Addendum Note (Signed)
Addended by: Randal Buba on: 10/20/2020 09:31 AM   Modules accepted: Orders

## 2020-10-20 NOTE — Telephone Encounter (Signed)
Copied from [place] [id number]. Topic: General - Other >> Oct 19, 2020  2:40 PM Leward Quan A wrote: Reason for CRM: Patient daughter Laboy called in to inquire of Dr Caryn Section needing clarification on if her mom has Dementia or Alzheimer's Disease Please call [place] / Olivia Mackie to discuss  at  Ph# [phone] >> Oct 20, 2020 10:37 AM Erick Blinks wrote: Pt's daughter called requesting to speak to the same nurse she spoke to earlier Best contact: [phone]

## 2020-10-20 NOTE — Telephone Encounter (Addendum)
I called and spoke with patient's daughter [name]. She states that patient's husband passed away recently, and patient thinks that they are hiding him to prevent her from visiting.  She states that patient has been confused for several months, but patient's husband tried to down play it as if she was fine, because he didn't want anyone to take her away and place her in a home. Patient has had a flat affect and has not showed any emotion or cried after her husband passed away. Last night patient thought  she was moving out of her apartment in [place]. Patient was actually packing up thinks and slinging things around the room. Patient's confusion seems to worsen in the evening time, and improves in the morning. Patient does not remember what happens the day before, so every day is like she is starting new. [name] would like a referral to Neurology. She says this is a lot to handle and she needs resources on how to take care of her mom.

## 2020-10-21 ENCOUNTER — Ambulatory Visit: Payer: Self-pay

## 2020-10-21 NOTE — Telephone Encounter (Signed)
Pt.'s daughter pt. Has a blister on her left shin, quarter-sized, fluid filled. Has some "redness around it and her leg is swollen." No pain. States pt.'s husband has recently died. Remer Macho is today. States pt. Has dementia. "She has been confused for a very long time. The other day she thought it was 1957." Virtual visit made for tomorrow per Elmyra Ricks in the practice.    Reason for Disposition  [1] Looks infected (spreading redness, red streak, pus) AND [2] no fever  Answer Assessment - Initial Assessment Questions 1. APPEARANCE of BLISTER: "What does it look like?"     Quarter sized 2. SIZE: "How large is the blister?" (inches, cm or compare to coins)     Quarter sized 3. LOCATION: "Where are the blisters located?"      Left leg - 4 inches above ankle on shin 4. WHEN: "When did the blister happen?"     Today 5. CAUSE: "What do you think caused the blister?"     Unsure 6. PAIN: "Does it hurt?" If Yes, ask: "How bad is the pain?"  (Scale 1-10; or mild, moderate, severe)     No 7. OTHER SYMPTOMS: "Do you have any other symptoms?" (e.g., fever)     Redness around blister and swelling to leg  Protocols used: Blister - Foot and Hand-A-AH

## 2020-10-21 NOTE — Telephone Encounter (Signed)
See previous telephone encounter.   Thanks,   -Mickel Baas

## 2020-10-22 ENCOUNTER — Telehealth (INDEPENDENT_AMBULATORY_CARE_PROVIDER_SITE_OTHER): Payer: Medicare Other | Admitting: Family Medicine

## 2020-10-22 ENCOUNTER — Telehealth: Payer: Self-pay

## 2020-10-22 ENCOUNTER — Encounter: Payer: Self-pay | Admitting: Family Medicine

## 2020-10-22 DIAGNOSIS — L03116 Cellulitis of left lower limb: Secondary | ICD-10-CM

## 2020-10-22 DIAGNOSIS — R600 Localized edema: Secondary | ICD-10-CM | POA: Diagnosis not present

## 2020-10-22 DIAGNOSIS — R410 Disorientation, unspecified: Secondary | ICD-10-CM | POA: Diagnosis not present

## 2020-10-22 DIAGNOSIS — L603 Nail dystrophy: Secondary | ICD-10-CM

## 2020-10-22 DIAGNOSIS — I10 Essential (primary) hypertension: Secondary | ICD-10-CM

## 2020-10-22 DIAGNOSIS — R300 Dysuria: Secondary | ICD-10-CM | POA: Diagnosis not present

## 2020-10-22 MED ORDER — AMLODIPINE BESYLATE 2.5 MG PO TABS: 2.5000 mg | ORAL_TABLET | Freq: Every day | ORAL | 1 refills | Status: DC

## 2020-10-22 MED ORDER — FUROSEMIDE 20 MG PO TABS: 20.0000 mg | ORAL_TABLET | Freq: Every day | ORAL | 1 refills | Status: DC | PRN

## 2020-10-22 MED ORDER — CEPHALEXIN 500 MG PO CAPS: 500.0000 mg | ORAL_CAPSULE | Freq: Three times a day (TID) | ORAL | 0 refills | Status: AC

## 2020-10-22 NOTE — Telephone Encounter (Signed)
FMLA papers coming back for patient's daughter to take a leave of absence as assistant principal,  due to her fathers death last week.  She said her mother cannot stay alone and is having memory issues and has many appts coming up that she has to take her to.   A list of all the issues she is having is attached to the [facility] papers.     Olivia Mackie would like to get the [facility] papers ASAP. Please call when ready to [phone]

## 2020-10-22 NOTE — Progress Notes (Signed)
MyChart Video Visit    Virtual Visit via Video Note   This visit type was conducted due to national recommendations for restrictions regarding the COVID-19 Pandemic (e.g. social distancing) in an effort to limit this patient's exposure and mitigate transmission in our community. This patient is at least at moderate risk for complications without adequate follow up. This format is felt to be most appropriate for this patient at this time. Physical exam was limited by quality of the video and audio technology used for the visit.   Patient location: home Provider location: [facility]  I discussed the limitations of evaluation and management by telemedicine and the availability of in person appointments. The patient expressed understanding and agreed to proceed.  Patient: [name]   DOB: 03/23/1945   75 y.o. Female  MRN: [id number] Visit Date: 10/22/2020  Today's healthcare provider: Lelon Huh, MD   Chief Complaint  Patient presents with   Leg Pain   Urinary Tract Infection   Memory Loss   Subjective    Wound Check There has been clear and colored discharge from the wound.  Urinary Tract Infection  This is a new problem. The problem has been gradually worsening. There has been no fever. Associated symptoms include frequency. Pertinent negatives include no flank pain, hematuria, nausea, urgency or vomiting.      Medications: Outpatient Medications Prior to Visit  Medication Sig   albuterol (VENTOLIN HFA) 108 (90 Base) MCG/ACT inhaler INHALE 1-2 PUFFS INTO THE LUNGS EVERY 6 (SIX) HOURS AS NEEDED FOR SHORTNESS OF BREATH. (Patient not taking: Reported on 10/05/2020)   amLODipine (NORVASC) 5 MG tablet Take 1 tablet (5 mg total) by mouth daily.   Cholecalciferol (VITAMIN D) 50 MCG (2000 UT) tablet Take 2,000 Units by mouth daily. (Patient not taking: Reported on 10/05/2020)   cyanocobalamin 500 MCG tablet Take 250 mcg by mouth daily.    Fluticasone-Umeclidin-Vilant (TRELEGY ELLIPTA)  200-62.5-25 MCG/INH AEPB Inhale 1 puff into the lungs daily for 28 days.   GEMTESA 75 MG TABS TAKE 1 TABLET BY MOUTH DAILY   HYDROcodone-acetaminophen (NORCO) 10-325 MG tablet Take 1 tablet by mouth every 6 (six) hours as needed. (Patient not taking: Reported on 10/05/2020)   levalbuterol (XOPENEX) 1.25 MG/3ML nebulizer solution USE 1 VIAL IN NEBULIZER EVERY 8 HOURS AS NEEDED FOR WHEEZING (Patient not taking: Reported on 10/05/2020)   lisinopril (ZESTRIL) 10 MG tablet TAKE 1 TABLET BY MOUTH EVERY DAY--DOSE INCREASE   metoprolol tartrate (LOPRESSOR) 50 MG tablet TAKE 1 AND 1/2 TABLETS BY MOUTH 2 TIMES DAILY.   oxybutynin (DITROPAN) 5 MG tablet Take 1 tablet (5 mg total) by mouth 2 (two) times daily.   Vibegron (GEMTESA) 75 MG TABS Take 75 mg by mouth daily. (Patient not taking: Reported on 10/05/2020)   No facility-administered medications prior to visit.    Review of Systems  Constitutional: Negative.   Respiratory: Negative.    Cardiovascular:  Positive for leg swelling. Negative for chest pain and palpitations.  Gastrointestinal:  Positive for diarrhea. Negative for abdominal distention, abdominal pain, anal bleeding, blood in stool, constipation, nausea, rectal pain and vomiting.  Genitourinary:  Positive for enuresis and frequency. Negative for dysuria, flank pain, hematuria, urgency, vaginal bleeding, vaginal discharge and vaginal pain.  Musculoskeletal:  Positive for myalgias. Negative for arthralgias, back pain, gait problem, joint swelling, neck pain and neck stiffness.  Skin:  Positive for color change and wound (Size of a quater above right ankle.). Negative for pallor and rash.  Neurological:  Negative for dizziness, light-headedness and headaches.  Psychiatric/Behavioral:  Positive for sleep disturbance.      Objective    There were no vitals taken for this visit.   Physical Exam   Awake, alert, oriented x 3. In no apparent distress By video several small areas of clear  seepage from both lower legs with faint erythema. Thickened dysmorphic nails noted.   Assessment & Plan     1. Localized edema She has chronic HFpEF and has not taken furosemide for a few months.  - furosemide (LASIX) 20 MG tablet; Take 1 tablet (20 mg total) by mouth daily as needed for fluid.  Dispense: 30 tablet; Refill: 1 Reinforced importance of keeping feet elevated when she is not   2. Cellulitis of left lower extremity  - cephALEXin (KEFLEX) 500 MG capsule; Take 1 capsule (500 mg total) by mouth 3 (three) times daily for 10 days.  Dispense: 30 capsule; Refill: 0  3. Dysuria Has history of recurrent UTIs - cephALEXin (KEFLEX) 500 MG capsule; Take 1 capsule (500 mg total) by mouth 3 (three) times daily for 10 days.  Dispense: 30 capsule; Refill: 0  4. Essential hypertension Reduce to  amLODipine (NORVASC) 2.5 MG tablet; Take 1 tablet (2.5 mg total) by mouth daily.  Dispense: 90 tablet; Refill: 1 due to edema. Consider adding another BP agent  5. Nail dystrophy  - Ambulatory referral to Podiatry  6. Confusion Patient's husband recently passed away unexpectedly and daughter is now caring for patient. She has noticed she has been having episodes of confusion and forgetfulness.    Neurology referral has been placed within previous telephone encounter.     I discussed the assessment and treatment plan with the patient. The patient was provided an opportunity to ask questions and all were answered. The patient agreed with the plan and demonstrated an understanding of the instructions.   The patient was advised to call back or seek an in-person evaluation if the symptoms worsen or if the condition fails to improve as anticipated.  I provided 12 minutes of non-face-to-face time during this encounter.  The entirety of the information documented in the History of Present Illness, Review of Systems and Physical Exam were personally obtained by me. Portions of this information were  initially documented by the CMA and reviewed by me for thoroughness and accuracy.    Lelon Huh, MD [facility] [phone] (phone) [phone] (fax)  [place]

## 2020-10-22 NOTE — Addendum Note (Signed)
Addended by: Lelon Huh E on: 10/22/2020 10:20 AM   Modules accepted: Orders

## 2020-10-26 NOTE — Telephone Encounter (Signed)
Needs to know exactly what dates she is starting and ending her leave of absence.

## 2020-10-26 NOTE — Telephone Encounter (Signed)
[name] states she has been out of work since 10/19/2020 and will not be going back for 12 weeks.  She states that [name] is not able to care for herself, transfer from bed to wheelchair, or use the bathroom on her own.  She is wanting to know if there is some way she can get help with caring for her mother.  [name] states she is not ready to put her in skilled nursing and would like to keep her home as long as possible.    Thanks,   -Mickel Baas

## 2020-10-27 NOTE — Telephone Encounter (Signed)
FMLA forms are complete and ready to pick up.  Is home health visiting patient anymore. If not then recommend home health Nursing and social worker referral for congestive heart failure.

## 2020-10-28 ENCOUNTER — Other Ambulatory Visit: Payer: Self-pay

## 2020-10-28 ENCOUNTER — Ambulatory Visit: Payer: Medicare Other | Admitting: Podiatry

## 2020-10-28 DIAGNOSIS — M79675 Pain in left toe(s): Secondary | ICD-10-CM | POA: Diagnosis not present

## 2020-10-28 DIAGNOSIS — N184 Chronic kidney disease, stage 4 (severe): Secondary | ICD-10-CM | POA: Diagnosis not present

## 2020-10-28 DIAGNOSIS — M79674 Pain in right toe(s): Secondary | ICD-10-CM | POA: Diagnosis not present

## 2020-10-28 DIAGNOSIS — B351 Tinea unguium: Secondary | ICD-10-CM

## 2020-10-28 DIAGNOSIS — E1122 Type 2 diabetes mellitus with diabetic chronic kidney disease: Secondary | ICD-10-CM

## 2020-11-02 ENCOUNTER — Encounter: Payer: Self-pay | Admitting: Podiatry

## 2020-11-02 NOTE — Progress Notes (Signed)
  Subjective:  Patient ID: [name], female    DOB: 1945/01/27,  MRN: [id number]  Chief Complaint  Patient presents with   Nail Problem    Nail trim    76 y.o. female returns for the above complaint.  Patient presents with thickened elongated dystrophic toenails x10.  Mild pain on palpation.  Patient is a diabetic with last A1c of 6.4.  She would like me debridement down she is not able to do her self.  She denies any other acute complaints.  Objective:  There were no vitals filed for this visit. Podiatric Exam: Vascular: dorsalis pedis and posterior tibial pulses are palpable bilateral. Capillary return is immediate. Temperature gradient is WNL. Skin turgor WNL  Sensorium: Normal Semmes Weinstein monofilament test. Normal tactile sensation bilaterally. Nail Exam: Pt has thick disfigured discolored nails with subungual debris noted bilateral entire nail hallux through fifth toenails.  Pain on palpation to the nails. Ulcer Exam: There is no evidence of ulcer or pre-ulcerative changes or infection. Orthopedic Exam: Muscle tone and strength are WNL. No limitations in general ROM. No crepitus or effusions noted.  Skin: No Porokeratosis. No infection or ulcers    Assessment & Plan:   1. Pain due to onychomycosis of toenails of both feet   2. Type 2 diabetes mellitus with stage 4 chronic kidney disease, without long-term current use of insulin ([place])     Patient was evaluated and treated and all questions answered.  Onychomycosis with pain  -Nails palliatively debrided as below. -Educated on self-care  Procedure: Nail Debridement Rationale: pain  Type of Debridement: manual, sharp debridement. Instrumentation: Nail nipper, rotary burr. Number of Nails: 10  Procedures and Treatment: Consent by patient was obtained for treatment procedures. The patient understood the discussion of treatment and procedures well. All questions were answered thoroughly reviewed. Debridement of mycotic  and hypertrophic toenails, 1 through 5 bilateral and clearing of subungual debris. No ulceration, no infection noted.  Return Visit-Office Procedure: Patient instructed to return to the office for a follow up visit 3 months for continued evaluation and treatment.  Boneta Lucks, DPM    No follow-ups on file.

## 2020-11-03 ENCOUNTER — Other Ambulatory Visit: Payer: Self-pay | Admitting: *Deleted

## 2020-11-03 DIAGNOSIS — I10 Essential (primary) hypertension: Secondary | ICD-10-CM

## 2020-11-04 DIAGNOSIS — R262 Difficulty in walking, not elsewhere classified: Secondary | ICD-10-CM | POA: Diagnosis not present

## 2020-11-04 DIAGNOSIS — R413 Other amnesia: Secondary | ICD-10-CM | POA: Diagnosis not present

## 2020-11-04 DIAGNOSIS — G479 Sleep disorder, unspecified: Secondary | ICD-10-CM | POA: Diagnosis not present

## 2020-11-09 ENCOUNTER — Ambulatory Visit: Payer: Self-pay

## 2020-11-09 NOTE — Telephone Encounter (Signed)
  Spoke with daughter Balbuena who was in the presence of the pt. Pt having urinary frequency and foul smelling urine that started 2 days ago. Daughter denies fever. Pt is c/o low back pain and pelvic area pressure. Daughter stated pt is acting aggravated and difficult. Pt has dementia.  Daughter stated that her mother is drinking fluids. Daughter wanting Dr. Caryn Section to call abx to pharmacy.  Discussed triage and daughter's request and Mickel Baas [facility]) agreed that pt need to go to [facility] ASAP. Advised daughter that pt needs evaluation as soon as possible at Urgent Care. Daughter stated that her mother will not go. Advised daughter that if pt develops a fever or is acting lethargic or worse to call 911. Daughter agreeable.        Reason for Disposition  Bad or foul-smelling urine  Urinating more frequently than usual (i.e., frequency)  Answer Assessment - Initial Assessment Questions 1. SYMPTOM: "What's the main symptom you're concerned about?" (e.g., frequency, incontinence)     frequency 2. ONSET: "When did the  frequency  start?"     2 days ago 3. PAIN: "Is there any pain?" If Yes, ask: "How bad is it?" (Scale: 1-10; mild, moderate, severe)     Yes-wincing 4. CAUSE: "What do you think is causing the symptoms?"     yes 5. OTHER SYMPTOMS: "Do you have any other symptoms?" (e.g., fever, flank pain, blood in urine, pain with urination)     Foul smelling urine, pain with urination, low back is achy and pelvic pain 6. PREGNANCY: "Is there any chance you are pregnant?" "When was your last menstrual period?"     N/a  Protocols used: Urinary Symptoms-A-AH

## 2020-11-10 ENCOUNTER — Other Ambulatory Visit: Payer: Self-pay

## 2020-11-10 ENCOUNTER — Encounter: Payer: Self-pay | Admitting: Emergency Medicine

## 2020-11-10 ENCOUNTER — Emergency Department
Admission: EM | Admit: 2020-11-10 | Discharge: 2020-11-10 | Disposition: A | Discharge status: Home / Self Care | Payer: Medicare Other | Attending: Emergency Medicine | Admitting: Emergency Medicine

## 2020-11-10 DIAGNOSIS — N390 Urinary tract infection, site not specified: Secondary | ICD-10-CM | POA: Diagnosis not present

## 2020-11-10 DIAGNOSIS — Z79899 Other long term (current) drug therapy: Secondary | ICD-10-CM | POA: Diagnosis not present

## 2020-11-10 DIAGNOSIS — R4182 Altered mental status, unspecified: Secondary | ICD-10-CM | POA: Insufficient documentation

## 2020-11-10 DIAGNOSIS — R609 Edema, unspecified: Secondary | ICD-10-CM | POA: Insufficient documentation

## 2020-11-10 DIAGNOSIS — J45909 Unspecified asthma, uncomplicated: Secondary | ICD-10-CM | POA: Insufficient documentation

## 2020-11-10 DIAGNOSIS — I509 Heart failure, unspecified: Secondary | ICD-10-CM | POA: Diagnosis not present

## 2020-11-10 DIAGNOSIS — I130 Hypertensive heart and chronic kidney disease with heart failure and stage 1 through stage 4 chronic kidney disease, or unspecified chronic kidney disease: Secondary | ICD-10-CM | POA: Diagnosis not present

## 2020-11-10 DIAGNOSIS — Z20822 Contact with and (suspected) exposure to covid-19: Secondary | ICD-10-CM | POA: Diagnosis not present

## 2020-11-10 DIAGNOSIS — I2510 Atherosclerotic heart disease of native coronary artery without angina pectoris: Secondary | ICD-10-CM | POA: Insufficient documentation

## 2020-11-10 DIAGNOSIS — F0390 Unspecified dementia without behavioral disturbance: Secondary | ICD-10-CM | POA: Diagnosis not present

## 2020-11-10 DIAGNOSIS — E1122 Type 2 diabetes mellitus with diabetic chronic kidney disease: Secondary | ICD-10-CM | POA: Diagnosis not present

## 2020-11-10 DIAGNOSIS — N184 Chronic kidney disease, stage 4 (severe): Secondary | ICD-10-CM | POA: Insufficient documentation

## 2020-11-10 LAB — COMPREHENSIVE METABOLIC PANEL
ALT: 8 U/L (ref 0–44)
AST: 17 U/L (ref 15–41)
Albumin: 3.1 g/dL — ABNORMAL LOW (ref 3.5–5.0)
Alkaline Phosphatase: 63 U/L (ref 38–126)
Anion gap: 6 (ref 5–15)
BUN: 21 mg/dL (ref 8–23)
CO2: 30 mmol/L (ref 22–32)
Calcium: 10.8 mg/dL — ABNORMAL HIGH (ref 8.9–10.3)
Chloride: 101 mmol/L (ref 98–111)
Creatinine, Ser: 1.61 mg/dL — ABNORMAL HIGH (ref 0.44–1.00)
GFR, Estimated: 33 mL/min — ABNORMAL LOW (ref >60)
Glucose, Bld: 119 mg/dL — ABNORMAL HIGH (ref 70–99)
Potassium: 3.7 mmol/L (ref 3.5–5.1)
Sodium: 137 mmol/L (ref 135–145)
Total Bilirubin: 0.6 mg/dL (ref 0.3–1.2)
Total Protein: 8.9 g/dL — ABNORMAL HIGH (ref 6.5–8.1)

## 2020-11-10 LAB — RESP PANEL BY RT-PCR (FLU A&B, COVID) ARPGX2
Influenza A by PCR: NEGATIVE
Influenza B by PCR: NEGATIVE
SARS Coronavirus 2 by RT PCR: NEGATIVE

## 2020-11-10 LAB — URINALYSIS, COMPLETE (UACMP) WITH MICROSCOPIC
Bilirubin Urine: NEGATIVE
Glucose, UA: NEGATIVE mg/dL
Hgb urine dipstick: NEGATIVE
Ketones, ur: NEGATIVE mg/dL
Nitrite: NEGATIVE
Protein, ur: >=300 mg/dL — AB
Specific Gravity, Urine: 1.013 (ref 1.005–1.030)
WBC, UA: >50 WBC/hpf — ABNORMAL HIGH (ref 0–5)
pH: 5 (ref 5.0–8.0)

## 2020-11-10 LAB — CBC
HCT: 29.7 % — ABNORMAL LOW (ref 36.0–46.0)
Hemoglobin: 9.5 g/dL — ABNORMAL LOW (ref 12.0–15.0)
MCH: 29.2 pg (ref 26.0–34.0)
MCHC: 32 g/dL (ref 30.0–36.0)
MCV: 91.4 fL (ref 80.0–100.0)
Platelets: 262 10*3/uL (ref 150–400)
RBC: 3.25 MIL/uL — ABNORMAL LOW (ref 3.87–5.11)
RDW: 14.6 % (ref 11.5–15.5)
WBC: 6.8 10*3/uL (ref 4.0–10.5)
nRBC: 0 % (ref 0.0–0.2)

## 2020-11-10 LAB — CBG MONITORING, ED: Glucose-Capillary: 116 mg/dL — ABNORMAL HIGH (ref 70–99)

## 2020-11-10 MED ORDER — LEVOFLOXACIN 250 MG PO TABS: 250.0000 mg | ORAL_TABLET | Freq: Every day | ORAL | 0 refills | Status: AC

## 2020-11-10 MED ORDER — CEPHALEXIN 500 MG PO CAPS: 500.0000 mg | ORAL_CAPSULE | Freq: Four times a day (QID) | ORAL | 0 refills | Status: AC

## 2020-11-10 MED ORDER — LEVOFLOXACIN IN D5W 500 MG/100ML IV SOLN
500.0000 mg | Freq: Once | INTRAVENOUS | Status: AC
  Administered 2020-11-10: 500 mg via INTRAVENOUS
  Filled 2020-11-10: qty 100

## 2020-11-10 NOTE — Addendum Note (Signed)
Addended by: Birdie Sons on: 11/10/2020 09:43 AM   Modules accepted: Orders

## 2020-11-10 NOTE — Telephone Encounter (Signed)
Lum Keas advised.  (Pt's daughter)  She states they are at the ER now.   Thanks,   -Mickel Baas

## 2020-11-10 NOTE — Discharge Instructions (Signed)
Please begin taking your antibiotic 1 tablet each morning starting tomorrow 11/11/2020.  Also as we discussed for the next 5 days please take your diuretic/furosemide tablets each morning as prescribed by your doctor to help with your lower extremity edema.  Return to the emergency department for any increased confusion, fever or any other symptom personally concerning to yourself.  Otherwise please follow-up with your doctor in the next 2 days for recheck/reevaluation.

## 2020-11-10 NOTE — ED Notes (Signed)
This RN attempted to collect urine for U/A.  Pt cannister empty and brief dry upon assessment, repositioned purewick.

## 2020-11-10 NOTE — Telephone Encounter (Signed)
Prescription cephalexin sent to [facility]

## 2020-11-10 NOTE — ED Notes (Addendum)
Pt states that there is a "midget" that she sees outside "every once and a while." States that he does not speak to her but she watches him outside. Daughter states that there is no midget.

## 2020-11-10 NOTE — ED Provider Notes (Signed)
[facility] Emergency Department Provider Note  Time seen: 10:16 AM  I have reviewed the triage vital signs and the nursing notes.   HISTORY  Chief Complaint Altered Mental Status and Leg Swelling   HPI [name] is a 75 y.o. female with a past medical history of asthma, CAD, diabetes, hypertension, dementia, CHF, peripheral edema, presents to the emergency department for possible urinary tract infection, confusion and peripheral edema.  According to the daughter patient was being treated for urinary tract infection and appeared to be improving however the antibiotic ran out on Saturday and beginning Sunday or Monday the daughter noted a strong smell to the patient's urine and felt like her confusion was getting worse.  Patient has baseline dementia and has confusion at baseline but the daughter states that appeared to be somewhat worse than normal.  Daughter also noticed that her lower extremity edema appeared to be somewhat worse than normal but states the patient always has lower extremity edema.  No known fever, no reported cough or congestion.  Patient is in bed calm cooperative pleasant, no distress denies any complaints or pain at this time.   Past Medical History:  Diagnosis Date   Aortic stenosis    Asthma    Coronary artery disease    Diabetes mellitus without complication ([place])    History of measles    Hypertension    Nonischemic cardiomyopathy ([place])    Pulmonary hypertension ([place])     Patient Active Problem List   Diagnosis Date Noted   History of multiple myeloma 10/06/2019   CKD (chronic kidney disease), stage IV ([place]) 04/10/2019   Chronic HFrEF (heart failure with reduced ejection fraction) ([place]) 09/27/2017   Urinary frequency 09/27/2017   B12 deficiency 03/01/2017   Lymphedema 02/14/2017   Chronic venous insufficiency 02/14/2017   Nonischemic cardiomyopathy ([place]) 06/14/2016   CAD in native artery 06/14/2016   Bilateral lower extremity  edema 06/14/2016   Multiple myeloma not having achieved remission ([place]) 03/26/2016   Back pain 03/26/2016   Diverticulosis 03/20/2016   Atherosclerosis of aorta ([place]) 03/20/2016   Congestive heart failure ([place]) 03/07/2016   Monoclonal gammopathy 01/25/2016   Chronic anemia 01/25/2016   LVH (left ventricular hypertrophy) 08/24/2015   Mild aortic stenosis 07/14/2015   Urinary incontinence 03/02/2015   Diabetic retinopathy with macular edema ([place]) 02/19/2015   Constipation 12/02/2014   Osteoarthritis 07/31/2014   Insomnia 06/30/2014   Bleeding internal hemorrhoids 06/30/2014   Osteopenia 06/30/2014   Fecal occult blood test positive 06/30/2014   Vitamin D deficiency 04/29/2009   Type 2 diabetes mellitus with stage 4 chronic kidney disease ([place]) 01/28/2009   Eczema intertrigo 01/03/2008   Hyperlipidemia 04/02/2006   Chronic edema 08/11/2003   BMI 40.0-44.9, adult ([place]) 09/22/2002   Intermittent asthma, uncontrolled 08/31/2000   Essential hypertension 08/31/2000   Leg varices 04/18/1999    Past Surgical History:  Procedure Laterality Date   CARDIAC CATHETERIZATION     Cyst(solitary) of breast:removed     RIGHT/LEFT HEART CATH AND CORONARY ANGIOGRAPHY N/A 03/07/2016   Procedure: Right/Left Heart Cath and Coronary Angiography;  Surgeon: Nelva Bush, MD;  Location: [facility];  Service: Cardiovascular;  Laterality: N/A;   TUBAL LIGATION      Prior to Admission medications   Medication Sig Start Date End Date Taking? Authorizing Provider  albuterol (VENTOLIN HFA) 108 (90 Base) MCG/ACT inhaler INHALE 1-2 PUFFS INTO THE LUNGS EVERY 6 (SIX) HOURS AS NEEDED FOR SHORTNESS OF BREATH. Patient not  taking: Reported on 10/05/2020 04/23/20   Birdie Sons, MD  amLODipine (NORVASC) 2.5 MG tablet Take 1 tablet (2.5 mg total) by mouth daily. 10/22/20   Birdie Sons, MD  cephALEXin (KEFLEX) 500 MG capsule Take 1 capsule (500 mg total) by mouth 4 (four) times daily for 10 days.  11/10/20 11/20/20  Birdie Sons, MD  Cholecalciferol (VITAMIN D) 50 MCG (2000 UT) tablet Take 2,000 Units by mouth daily.    [provider]  cyanocobalamin 500 MCG tablet Take 250 mcg by mouth daily.     [provider]  furosemide (LASIX) 20 MG tablet Take 1 tablet (20 mg total) by mouth daily as needed for fluid. 10/22/20   Birdie Sons, MD  GEMTESA 75 MG TABS TAKE 1 TABLET BY MOUTH DAILY Patient not taking: Reported on 10/22/2020 08/25/20   Nori Riis, PA-C  HYDROcodone-acetaminophen (NORCO) 10-325 MG tablet Take 1 tablet by mouth every 6 (six) hours as needed. Patient not taking: Reported on 10/05/2020    [provider]  levalbuterol (XOPENEX) 1.25 MG/3ML nebulizer solution USE 1 VIAL IN NEBULIZER EVERY 8 HOURS AS NEEDED FOR WHEEZING Patient not taking: Reported on 10/05/2020 05/14/20   Birdie Sons, MD  lisinopril (ZESTRIL) 10 MG tablet TAKE 1 TABLET BY MOUTH EVERY DAY--DOSE INCREASE 08/31/20   End, Harrell Gave, MD  metoprolol tartrate (LOPRESSOR) 50 MG tablet TAKE 1 AND 1/2 TABLETS BY MOUTH 2 TIMES DAILY. 09/23/20   Marrianne Mood D, PA-C  oxybutynin (DITROPAN) 5 MG tablet Take 1 tablet (5 mg total) by mouth 2 (two) times daily. 07/14/20   McGowan, Larene Beach A, PA-C  Vibegron (GEMTESA) 75 MG TABS Take 75 mg by mouth daily. Patient not taking: No sig reported 07/14/20   Zara Council A, PA-C    Allergies  Allergen Reactions   Hydrochlorothiazide Other (See Comments)    Leg Cramps   Penicillins Rash    Family History  Problem Relation Age of Onset   Cancer Mother        Uterine cancer   Diabetes Mother    Heart disease Mother    Heart failure Mother    Parkinson's disease Father    Cancer Sister        breast cancer   Hypertension Sister    Diabetes Sister        Non-insulin dependent Diabetes Mellitus   Diabetes Brother        Non-insulin Dependent Diabetes Mellitus   Cancer Brother    Mesothelioma Brother     Social  History Social History   Tobacco Use   Smoking status: Never   Smokeless tobacco: Never  Vaping Use   Vaping Use: Never used  Substance Use Topics   Alcohol use: No   Drug use: No    Review of Systems Per patient and daughter Constitutional: Negative for fever. Cardiovascular: Negative for chest pain. Respiratory: Negative for shortness of breath. Gastrointestinal: Negative for abdominal pain, vomiting  Genitourinary: Increased urinary frequency with strong odor Musculoskeletal: Lower extreme edema. Neurological: Negative for headache All other ROS negative  ____________________________________________   PHYSICAL EXAM:  VITAL SIGNS: ED Triage Vitals  Enc Vitals Group     BP 11/10/20 1014 (!) 165/91     Pulse Rate 11/10/20 1014 65     Resp 11/10/20 1014 16     Temp --      Temp src --      SpO2 11/10/20 1014 100 %     Weight  11/10/20 0952 199 lb 15.3 oz (90.7 kg)     Height 11/10/20 0952 5' 1"  (1.549 m)     Head Circumference --      Peak Flow --      Pain Score 11/10/20 0952 0     Pain Loc --      Pain Edu? --      Excl. in [place]? --    Constitutional: Alert, no acute distress.  Well appearing. Eyes: Normal exam ENT      Head: Normocephalic and atraumatic.      Mouth/Throat: Mucous membranes are moist. Cardiovascular: Normal rate, regular rhythm.  Respiratory: Normal respiratory effort without tachypnea nor retractions. Breath sounds are clear  Gastrointestinal: Soft and nontender. No distention.  Musculoskeletal: 1-2+ lower extremity edema.  Nontender. Neurologic:  Normal speech and language. No gross focal neurologic deficits  Skin:  Skin is warm, dry and intact.  Psychiatric: Mood and affect are normal.   ____________________________________________    INITIAL IMPRESSION / ASSESSMENT AND PLAN / ED COURSE  Pertinent labs & imaging results that were available during my care of the patient were reviewed by me and considered in my medical decision making  (see chart for details).   Patient presents emergency department for evaluation for possible increased confusion lower extremity edema as well as urinary frequency with strong odor.  Daughter was concerned as the patient recently finished a course of antibiotics but appeared to have symptoms of urinary tract infection once again.  She called her doctor who recommended they take her to the emergency department for a work-up.  Here the patient appears well she is calm cooperative pleasant, no distress.  Patient has no complaints.  We will check labs, urinalysis and a COVID swab as a precaution.  We will continue to closely monitor while awaiting results.  Daughter's description of symptoms are suspicious for possible urinary tract infection/cystitis.  Urinalysis is consistent with urinary tract infection.  Patient is completing her course of cephalexin.  Given possible resistance we will switch to a flora quinolone such as Levaquin.  We will dose IV dose as well send urine culture.  Daughter agreeable to plan of care.  Once antibiotics have finished infusing and believe patient would be safe for discharge home.  [name] was evaluated in Emergency Department on 11/10/2020 for the symptoms described in the history of present illness. She was evaluated in the context of the global COVID-19 pandemic, which necessitated consideration that the patient might be at risk for infection with the SARS-CoV-2 virus that causes COVID-19. Institutional protocols and algorithms that pertain to the evaluation of patients at risk for COVID-19 are in a state of rapid change based on information released by regulatory bodies including the CDC and federal and state organizations. These policies and algorithms were followed during the patient's care in the ED.  ____________________________________________   FINAL CLINICAL IMPRESSION(S) / ED DIAGNOSES  Urinary tract infection   Harvest Dark, MD 11/10/20 1330

## 2020-11-10 NOTE — ED Triage Notes (Signed)
Pt comes into the ED via POV c/o AMS and bilateral leg swelling.  Per the family the AMS started 2-3 days ago.  Leg swelling has been an ongoing problem but it seems to be getting worse.  Pt is a CHF patient.  Pt currently has even and unlabored respirations and in NAD.

## 2020-11-11 LAB — URINE CULTURE: Culture: <10000 — AB

## 2020-11-13 ENCOUNTER — Other Ambulatory Visit: Payer: Self-pay | Admitting: Family Medicine

## 2020-11-13 DIAGNOSIS — R600 Localized edema: Secondary | ICD-10-CM

## 2020-11-14 NOTE — Telephone Encounter (Signed)
Requested Prescriptions  Pending Prescriptions Disp Refills  . furosemide (LASIX) 20 MG tablet [Pharmacy Med Name: FUROSEMIDE 20 MG TABLET] 30 tablet 1    Sig: TAKE 1 TABLET (20 MG TOTAL) BY MOUTH DAILY AS NEEDED FOR FLUID.     Cardiovascular:  Diuretics - Loop Failed - 11/13/2020  2:32 PM      Failed - Ca in normal range and within 360 days    Calcium  Date Value Ref Range Status  11/10/2020 10.8 (H) 8.9 - 10.3 mg/dL Final         Failed - Cr in normal range and within 360 days    Creat  Date Value Ref Range Status  12/21/2016 1.31 (H) 0.60 - 0.93 mg/dL Final    Comment:    For patients >27 years of age, the reference limit for Creatinine is approximately 13% higher for people identified as African-American. .    Creatinine, Ser  Date Value Ref Range Status  11/10/2020 1.61 (H) 0.44 - 1.00 mg/dL Final   Creatinine, POC  Date Value Ref Range Status  06/22/2016 n/a mg/dL Final         Failed - Last BP in normal range    BP Readings from Last 1 Encounters:  11/10/20 (!) 156/92         Passed - K in normal range and within 360 days    Potassium  Date Value Ref Range Status  11/10/2020 3.7 3.5 - 5.1 mmol/L Final         Passed - Na in normal range and within 360 days    Sodium  Date Value Ref Range Status  11/10/2020 137 135 - 145 mmol/L Final  04/28/2020 139 134 - 144 mmol/L Final         Passed - Valid encounter within last 6 months    Recent Outpatient Visits          3 weeks ago Localized edema   [facility] Birdie Sons, MD   1 month ago Type 2 diabetes mellitus with stage 4 chronic kidney disease, without long-term current use of insulin ([place])   [facility] Birdie Sons, MD   5 months ago Type 2 diabetes mellitus with stage 4 chronic kidney disease, without long-term current use of insulin ([place])   [facility] Birdie Sons, MD   9 months ago Diabetes mellitus with nephropathy [facility])   [facility] Birdie Sons, MD   1 year ago Diabetes mellitus with nephropathy [facility])   [facility] Birdie Sons, MD      Future Appointments            In 3 days End, Harrell Gave, MD [facility] [place], [facility]   In 1 month Fisher, Kirstie Peri, MD [facility], [place]

## 2020-11-17 ENCOUNTER — Encounter: Payer: Self-pay | Admitting: Internal Medicine

## 2020-11-17 ENCOUNTER — Other Ambulatory Visit: Payer: Self-pay

## 2020-11-17 ENCOUNTER — Ambulatory Visit: Payer: Medicare Other | Admitting: Internal Medicine

## 2020-11-17 VITALS — BP 140/82 | HR 75 | Ht 60.0 in | Wt 227.0 lb

## 2020-11-17 DIAGNOSIS — I10 Essential (primary) hypertension: Secondary | ICD-10-CM

## 2020-11-17 DIAGNOSIS — R600 Localized edema: Secondary | ICD-10-CM | POA: Diagnosis not present

## 2020-11-17 DIAGNOSIS — I350 Nonrheumatic aortic (valve) stenosis: Secondary | ICD-10-CM | POA: Diagnosis not present

## 2020-11-17 DIAGNOSIS — N1832 Chronic kidney disease, stage 3b: Secondary | ICD-10-CM | POA: Diagnosis not present

## 2020-11-17 DIAGNOSIS — I5022 Chronic systolic (congestive) heart failure: Secondary | ICD-10-CM | POA: Diagnosis not present

## 2020-11-17 MED ORDER — METOPROLOL TARTRATE 50 MG PO TABS: 75.0000 mg | ORAL_TABLET | Freq: Two times a day (BID) | ORAL | 0 refills | Status: DC

## 2020-11-17 MED ORDER — FUROSEMIDE 20 MG PO TABS: 20.0000 mg | ORAL_TABLET | Freq: Every day | ORAL | 1 refills | Status: DC | PRN

## 2020-11-17 NOTE — Progress Notes (Signed)
Follow-up Outpatient Visit Date: 11/17/2020  Primary Care Provider: Birdie Sons, MD [address] [place] [address]  Chief Complaint: Follow-up heart failure  HPI:  [name] is a 75 y.o. female with history of nonobstructive coronary artery disease, chronic HFrEF with recovered ejection fraction due to nonischemic cardiomyopathy, aortic stenosis, hypertension, diabetes mellitus, and multiple myeloma, who presents for follow-up of heart failure and chronic leg swelling.  She was last seen in our office in July by Marrianne Mood, PA, at which time she complained of worsening leg swelling.  She also had ulcers on both legs with some weeping of fluid.  Chronic dyspnea on exertion was unchanged.  No medication changes were made.  Today, [name] is accompanied by her daughter.  Since our last visit, [name]'s husband, who is also her caregiver, died after a sudden diagnosis of metastatic colon cancer complicated by what sounds like bowel perforation and DVT.  [name] had been dealing with worsening leg swelling, though this has now resolved after a 1 week course of daily furosemide.  She is also being treated with levofloxacin for UTI at the moment.  She feels fairly well with less leg swelling than at prior visits.  She has been having some loose stools, attributed to antibiotics.  She denies chest pain.  Chronic exertional dyspnea is stable.  Her daughter notes that Ms. Chumley has been referred to physical therapy.  She had fallen a couple of times around the time of her husband's hospitalization and death, though her she has not had any further events in the last couple of weeks.  --------------------------------------------------------------------------------------------------  Cardiovascular History & Procedures: Cardiovascular Problems: Chronic systolic and diastolic heart failure secondary to non-ischemic cardiomyopathy Nonobstructive coronary artery  disease Aortic stenosis   Risk Factors: Known coronary artery disease, hypertension, hyperlipidemia, diabetes mellitus, sedentary lifestyle, obesity, and age > 43   Cath/PCI: LHC/RHC (03/07/16): LMCA normal. LAD with 30% ostial stenosi, 30% midvessel narrowing, and 50% distal disease. LCx with 40% midvessel lesion. RCA with 30% proximal narrowing. LVEDP 26 mmHg. RA 18 (prominent "M" appearance), RV 82/21, PA mean 55, PCWP 25. Ao sat 96%, PA sat 54%, Fick Co/Ci 3.2/1.7. PVR 9.4 Wood units. No significant AoV gradient.   CV Surgery: None   EP Procedures and Devices: None   Non-Invasive Evaluation(s): TTE (04/18/2019): Normal LV size with mild LVH.  LVEF 60-65% with grade 1 diastolic dysfunction.  Normal RV size and function.  Mild pulmonary hypertension.  Very mild aortic stenosis. ABI's (07/26/16): Right: ABI not obtained, TBI 1.8. Left: ABI 1.9, TBI 1.0. Triphasic waveforms noted in both lower extremities. TTE (07/26/16): Normal LV size with moderate LVH. LVEF 55-60% with normal wall motion. Grade 1 diastolic dysfunction. Mild aortic stenosis (mean gradient 11 mmHg). Mitral annular calcification with mild to moderate MR. Mild left atrial enlargement. Normal RV size and function. Normal PA pressure. TTE (02/25/16): Normal LV size with moderate LVH. LVEF 25-30% with grade 2 diastolic dysfunction. Thickened aortic valve with likely moderate stenosis. MAC with mild MR. Mild LA enlargement. Mildly to moderately reduced RV contraction. Mild-moderate TR. Moderately PH (RVSP 55 mmHg).  Recent CV Pertinent Labs: Lab Results  Component Value Date   CHOL 164 09/30/2019   HDL 41 09/30/2019   LDLCALC 98 09/30/2019   TRIG 140 09/30/2019   CHOLHDL 4.0 09/30/2019   INR 1.0 03/02/2016   BNP 138.6 (H) 09/30/2019   BNP 1,592.0 (H) 02/09/2016   K 3.7 11/10/2020   BUN  21 11/10/2020   BUN 27 04/28/2020   CREATININE 1.61 (H) 11/10/2020   CREATININE 1.31 (H) 12/21/2016    Past medical and surgical history  were reviewed and updated in EPIC.  Current Meds  Medication Sig   albuterol (VENTOLIN HFA) 108 (90 Base) MCG/ACT inhaler INHALE 1-2 PUFFS INTO THE LUNGS EVERY 6 (SIX) HOURS AS NEEDED FOR SHORTNESS OF BREATH.   amLODipine (NORVASC) 5 MG tablet Take 5 mg by mouth daily.   cephALEXin (KEFLEX) 500 MG capsule Take 1 capsule (500 mg total) by mouth 4 (four) times daily for 10 days.   Cholecalciferol (VITAMIN D) 50 MCG (2000 UT) tablet Take 2,000 Units by mouth daily.   cyanocobalamin 1000 MCG tablet Take 1,000 mcg by mouth daily.   donepezil (ARICEPT) 5 MG tablet Take 1 tablet by mouth at bedtime.   levalbuterol (XOPENEX) 1.25 MG/3ML nebulizer solution USE 1 VIAL IN NEBULIZER EVERY 8 HOURS AS NEEDED FOR WHEEZING   levofloxacin (LEVAQUIN) 250 MG tablet Take 1 tablet (250 mg total) by mouth daily for 6 days.   lisinopril (ZESTRIL) 10 MG tablet TAKE 1 TABLET BY MOUTH EVERY DAY--DOSE INCREASE   oxybutynin (DITROPAN) 5 MG tablet Take 1 tablet (5 mg total) by mouth 2 (two) times daily.    Allergies: Hydrochlorothiazide and Penicillins  Social History   Tobacco Use   Smoking status: Never   Smokeless tobacco: Never  Vaping Use   Vaping Use: Never used  Substance Use Topics   Alcohol use: No   Drug use: No    Family History  Problem Relation Age of Onset   Cancer Mother        Uterine cancer   Diabetes Mother    Heart disease Mother    Heart failure Mother    Parkinson's disease Father    Cancer Sister        breast cancer   Hypertension Sister    Diabetes Sister        Non-insulin dependent Diabetes Mellitus   Diabetes Brother        Non-insulin Dependent Diabetes Mellitus   Cancer Brother    Mesothelioma Brother     Review of Systems: A 12-system review of systems was performed and was negative except as noted in the HPI.  --------------------------------------------------------------------------------------------------  Physical Exam: BP 140/82 (BP Location: Right Arm,  Patient Position: Sitting, Cuff Size: Normal)   Pulse 75   Ht 5' (1.524 m)   Wt 227 lb (103 kg)   SpO2 96%   BMI 44.33 kg/m   General:  NAD.  Accompanied by her daughter. Neck: No JVD or HJR. Lungs: Clear to auscultation bilaterally without wheezes or crackles. Heart: Regular rate and rhythm with 2/6 systolic murmur.  No rubs or gallops. Abdomen: Soft, nontender, nondistended. Extremities: Trace pretibial edema bilaterally.  EKG: Normal sinus rhythm with PACs, LVH, and nonspecific T wave changes.  PACs and nonspecific T wave changes are new since 04/28/2020.  Otherwise, there has been no significant interval change.  Lab Results  Component Value Date   WBC 6.8 11/10/2020   HGB 9.5 (L) 11/10/2020   HCT 29.7 (L) 11/10/2020   MCV 91.4 11/10/2020   PLT 262 11/10/2020    Lab Results  Component Value Date   NA 137 11/10/2020   K 3.7 11/10/2020   CL 101 11/10/2020   CO2 30 11/10/2020   BUN 21 11/10/2020   CREATININE 1.61 (H) 11/10/2020   GLUCOSE 119 (H) 11/10/2020   ALT 8 11/10/2020  Lab Results  Component Value Date   CHOL 164 09/30/2019   HDL 41 09/30/2019   LDLCALC 98 09/30/2019   TRIG 140 09/30/2019   CHOLHDL 4.0 09/30/2019    --------------------------------------------------------------------------------------------------  ASSESSMENT AND PLAN: Chronic HFrEF with recovered ejection fraction and leg edema: Ms. Janowicz remains fairly sedentary but hopes to become more active.  Her volume status actually appears better today with less leg swelling following recent course of furosemide.  She remains concerned about urinary frequency with standing diuretics.  We have agreed to continue with furosemide 20 mg daily as needed for edema.  Continue current regimen of metoprolol and lisinopril (patient has been out of metoprolol for about a week but will restart).  Aortic stenosis: Very mild by echocardiogram last year.  2/6 systolic murmur again heard on exam today.  No new  symptoms to suggest worsening aortic stenosis.  Hypertension: Blood pressure mildly elevated today in the setting of having been off metoprolol for the last week.  We will restart metoprolol at prior dose and continue current doses of lisinopril and amlodipine.  Creatinine stable on last check last week.  Chronic kidney disease stage 3b: Stable creatinine.  Continue current medications.  Follow-up: Return to clinic in 3 months.  Nelva Bush, MD 11/17/2020 10:39 AM

## 2020-11-17 NOTE — Patient Instructions (Signed)
Medication Instructions:   Your physician has recommended you make the following change in your medication:   TAKE your Lasix (Furosemide) 20 mg daily AS NEEDED for swelling  *If you need a refill on your cardiac medications before your next appointment, please call your pharmacy*   Lab Work:  None ordered  Testing/Procedures:  None ordered   Follow-Up: At [facility], you and your health needs are our priority.  As part of our continuing mission to provide you with exceptional heart care, we have created designated Provider Care Teams.  These Care Teams include your primary Cardiologist (physician) and Advanced Practice Providers (APPs -  Physician Assistants and Nurse Practitioners) who all work together to provide you with the care you need, when you need it.  We recommend signing up for the patient portal called "MyChart".  Sign up information is provided on this After Visit Summary.  MyChart is used to connect with patients for Virtual Visits (Telemedicine).  Patients are able to view lab/test results, encounter notes, upcoming appointments, etc.  Non-urgent messages can be sent to your provider as well.   To learn more about what you can do with MyChart, go to [email or web address].    Your next appointment:   3 month(s)  The format for your next appointment:   In Person  Provider:   You may see Nelva Bush, MD or one of the following Advanced Practice Providers on your designated Care Team:   Murray Hodgkins, NP Christell Faith, PA-C Cadence Kathlen Mody, [place]

## 2020-11-23 ENCOUNTER — Other Ambulatory Visit: Payer: Self-pay

## 2020-11-23 ENCOUNTER — Encounter: Payer: Self-pay | Admitting: Family Medicine

## 2020-11-23 ENCOUNTER — Ambulatory Visit (INDEPENDENT_AMBULATORY_CARE_PROVIDER_SITE_OTHER): Payer: Medicare Other | Admitting: Family Medicine

## 2020-11-23 VITALS — BP 106/64 | HR 47 | Temp 97.7°F | Resp 16 | Wt 227.0 lb

## 2020-11-23 DIAGNOSIS — G4700 Insomnia, unspecified: Secondary | ICD-10-CM

## 2020-11-23 DIAGNOSIS — I509 Heart failure, unspecified: Secondary | ICD-10-CM | POA: Diagnosis not present

## 2020-11-23 DIAGNOSIS — R531 Weakness: Secondary | ICD-10-CM

## 2020-11-23 DIAGNOSIS — N390 Urinary tract infection, site not specified: Secondary | ICD-10-CM | POA: Diagnosis not present

## 2020-11-23 MED ORDER — QUETIAPINE FUMARATE 25 MG PO TABS: 25.0000 mg | ORAL_TABLET | Freq: Every day | ORAL | 1 refills | Status: DC

## 2020-11-23 MED ORDER — LEVOFLOXACIN 250 MG PO TABS: 250.0000 mg | ORAL_TABLET | Freq: Every day | ORAL | 0 refills | Status: DC

## 2020-11-23 NOTE — Progress Notes (Signed)
Established patient visit   Patient: [name]   DOB: [birth year]   75 y.o. Female  MRN: [id number] Visit Date: 11/23/2020  Today's healthcare provider: Lelon Huh, MD   Chief Complaint  Patient presents with   Follow-up   Subjective    HPI  Follow up ER visit  Patient was seen in ER for UTI on 11/10/20. She was treated for UTI. Her daughter states that symptom prompting ER visit was malodorous urine.  Treatment for this included IV antibiotic and prescription for levofloxacin 250mg  daily. Her daughter also reports that patient dramatically perked up while in the ER becoming much more oriented than usual.  She reports excellent compliance with treatment. She reports this condition is Improved. Her daughter does report that patient is not sleeping well at all at night and is very disruptive. Was advised to try melatonin by neurologist which has not been helpful. The patient also gets very irritable and confused in the last afternoons and evenings.  ----------------------------------------------------------------------------------------- She does reports that she continues to be very weak and is not safe to get out of wheelchair and attempt to walk, even with use of walker. Has had several near falls.   Medications: Outpatient Medications Prior to Visit  Medication Sig   albuterol (VENTOLIN HFA) 108 (90 Base) MCG/ACT inhaler INHALE 1-2 PUFFS INTO THE LUNGS EVERY 6 (SIX) HOURS AS NEEDED FOR SHORTNESS OF BREATH.   amLODipine (NORVASC) 5 MG tablet Take 5 mg by mouth daily.   Cholecalciferol (VITAMIN D) 50 MCG (2000 UT) tablet Take 2,000 Units by mouth daily.   cyanocobalamin 1000 MCG tablet Take 1,000 mcg by mouth daily.   donepezil (ARICEPT) 5 MG tablet Take 1 tablet by mouth at bedtime.   furosemide (LASIX) 20 MG tablet Take 1 tablet (20 mg total) by mouth daily as needed (swelling).   HYDROcodone-acetaminophen (NORCO) 10-325 MG tablet Take 1 tablet by mouth every 6 (six)  hours as needed.   levalbuterol (XOPENEX) 1.25 MG/3ML nebulizer solution USE 1 VIAL IN NEBULIZER EVERY 8 HOURS AS NEEDED FOR WHEEZING   lisinopril (ZESTRIL) 10 MG tablet TAKE 1 TABLET BY MOUTH EVERY DAY--DOSE INCREASE   metoprolol tartrate (LOPRESSOR) 50 MG tablet Take 1.5 tablets (75 mg total) by mouth 2 (two) times daily.   oxybutynin (DITROPAN) 5 MG tablet Take 1 tablet (5 mg total) by mouth 2 (two) times daily.   traZODone (DESYREL) 100 MG tablet Take 100 mg by mouth at bedtime as needed.   Vibegron (GEMTESA) 75 MG TABS Take 75 mg by mouth daily. (Patient not taking: Reported on 11/23/2020)   No facility-administered medications prior to visit.      Last CBC Lab Results  Component Value Date   WBC 6.8 11/10/2020   HGB 9.5 (L) 11/10/2020   HCT 29.7 (L) 11/10/2020   MCV 91.4 11/10/2020   MCH 29.2 11/10/2020   RDW 14.6 11/10/2020   PLT 262 72/62/0355   Last metabolic panel Lab Results  Component Value Date   GLUCOSE 119 (H) 11/10/2020   NA 137 11/10/2020   K 3.7 11/10/2020   CL 101 11/10/2020   CO2 30 11/10/2020   BUN 21 11/10/2020   CREATININE 1.61 (H) 11/10/2020   GFRNONAA 33 (L) 11/10/2020   CALCIUM 10.8 (H) 11/10/2020   PHOS 3.5 05/31/2018   PROT 8.9 (H) 11/10/2020   ALBUMIN 3.1 (L) 11/10/2020   LABGLOB 4.4 09/30/2019   AGRATIO 0.8 (L) 09/30/2019   BILITOT 0.6 11/10/2020  ALKPHOS 63 11/10/2020   AST 17 11/10/2020   ALT 8 11/10/2020   ANIONGAP 6 11/10/2020       Objective    BP 106/64 (BP Location: Left Arm, Patient Position: Sitting, Cuff Size: Large)   Pulse (!) 47   Temp 97.7 F (36.5 C) (Temporal)   Resp 16   Wt 227 lb (103 kg)   SpO2 96%   BMI 44.33 kg/m  BP Readings from Last 3 Encounters:  11/23/20 106/64  11/17/20 140/82  11/10/20 (!) 156/92   Wt Readings from Last 3 Encounters:  11/23/20 227 lb (103 kg)  11/17/20 227 lb (103 kg)  11/10/20 226 lb 3.1 oz (102.6 kg)      Physical Exam   General: Appearance:    Severely obese female  in no acute distress  Eyes:    PERRL, conjunctiva/corneas clear, EOM's intact       Lungs:     Clear to auscultation bilaterally, respirations unlabored  Heart:    Bradycardic. Normal rhythm.  3/6 harsh, crescendo-decrescendo, systolic murmur at right upper sternal border   MS:   All extremities are intact.    Neurologic:   Awake, alert, oriented x 3. No apparent focal neurological defect.         Assessment & Plan     1. Urinary tract infection without hematuria, site unspecified No resolved, but has had multiple infections often resulting in delirium. Given printed prescription for  levofloxacin (LEVAQUIN) 250 MG tablet; Take 1 tablet (250 mg total) by mouth daily for 7 days.  Dispense: 7 tablet; Refill: 0 Advised that if she develops malodorous urine she will need to really push oral fluids, but if not improving within 24 hours she can ill prescription.    2. Insomnia, unspecified type No improvement with melatonin.  - QUEtiapine (SEROQUEL) 25 MG tablet; Take 1 tablet (25 mg total) by mouth at bedtime.  Dispense: 30 tablet; Refill: 1  3. Congestive heart failure, unspecified HF chronicity, unspecified heart failure type ([place])  - Ambulatory referral to [place]  4. Weakness  - Ambulatory referral to [place]  Date Time Provider Garrett  01/10/2021 11:00 AM Birdie Sons, MD [facility]  02/03/2021  9:30 AM Gardiner Barefoot, DPM [facility]  02/03/2021 10:20 AM BFP-NURSE HEALTH ADVISOR [facility]  02/24/2021 10:00 AM End, Harrell Gave, MD [facility]         The entirety of the information documented in the History of Present Illness, Review of Systems and Physical Exam were personally obtained by me. Portions of this information were initially documented by the CMA and reviewed by me for thoroughness and accuracy.     Lelon Huh, MD  [facility] [phone] (phone) [phone] (fax)  [place]

## 2020-11-30 ENCOUNTER — Ambulatory Visit: Payer: Self-pay | Admitting: *Deleted

## 2020-11-30 DIAGNOSIS — N390 Urinary tract infection, site not specified: Secondary | ICD-10-CM

## 2020-11-30 NOTE — Telephone Encounter (Signed)
Patient request a call back from a nurse had question about an Rx levofloxacin (LEVAQUIN) 250 MG tablet that was written several days ago and she waited to fill but the pharmacy refused and say that it is expired. Can be reached at Ph#  [phone]   Called patient to review medication request. Patient reports she went to pharmacy today to pick up prescribed antibiotic Levaquin and pharmacy reports medication expired. Patient reports PCP told patient to take medication if needed for bladder infection and patient misunderstood and thought pharmacy would keep prescription until she wanted to start med. Original order to start on 11/23/20- 11/30/20. Patient requesting to reorder medication and she will pick it up and take due to bladder infection systems have returned. Denies blood in urine and no fever at this time. Fever reported last night. Please advise if PCP can not reorder Levaquin. Care advise given. Patient verbalized understanding of care advise and to call back .

## 2020-11-30 NOTE — Telephone Encounter (Signed)
Reason for Disposition  [1] Prescription refill request for NON-ESSENTIAL medicine (i.e., no harm to patient if med not taken) AND [2] triager unable to refill per department policy  Answer Assessment - Initial Assessment Questions 1. DRUG NAME: "What medicine do you need to have refilled?"     Levaquin 2. REFILLS REMAINING: "How many refills are remaining?" (Note: The label on the medicine or pill bottle will show how many refills are remaining. If there are no refills remaining, then a renewal may be needed.)     none 3. EXPIRATION DATE: "What is the expiration date?" (Note: The label states when the prescription will expire, and thus can no longer be refilled.)     11/29.22 4. PRESCRIBING HCP: "Who prescribed it?" Reason: If prescribed by specialist, call should be referred to that group.     PCP 5. SYMPTOMS: "Do you have any symptoms?"     Yes , "bladder infection" 6. PREGNANCY: "Is there any chance that you are pregnant?" "When was your last menstrual period?"     na  Protocols used: Medication Refill and Renewal Call-A-AH

## 2020-12-01 MED ORDER — LEVOFLOXACIN 250 MG PO TABS: 250.0000 mg | ORAL_TABLET | Freq: Every day | ORAL | 0 refills | Status: AC

## 2020-12-01 NOTE — Telephone Encounter (Signed)
Have sent prescription to [facility] in [place]

## 2020-12-01 NOTE — Telephone Encounter (Signed)
Olivia Mackie (Pt's daughter) advised.  She states her mom has blisters on her legs that have opened up. She is worried that they are getting infected.  Olivia Mackie could only bring pt in on Friday.  I scheduled with Ria Comment for 1pm.    Thanks,   -Mickel Baas

## 2020-12-03 ENCOUNTER — Ambulatory Visit: Payer: Medicare Other | Admitting: Physician Assistant

## 2020-12-03 ENCOUNTER — Telehealth: Payer: Self-pay | Admitting: Family Medicine
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
## 2020-12-06 ENCOUNTER — Telehealth: Payer: Self-pay

## 2020-12-06 DIAGNOSIS — N184 Chronic kidney disease, stage 4 (severe): Secondary | ICD-10-CM | POA: Diagnosis not present

## 2020-12-06 DIAGNOSIS — Z8579 Personal history of other malignant neoplasms of lymphoid, hematopoietic and related tissues: Secondary | ICD-10-CM | POA: Diagnosis not present

## 2020-12-06 DIAGNOSIS — D631 Anemia in chronic kidney disease: Secondary | ICD-10-CM | POA: Diagnosis not present

## 2020-12-06 DIAGNOSIS — E785 Hyperlipidemia, unspecified: Secondary | ICD-10-CM | POA: Diagnosis not present

## 2020-12-06 DIAGNOSIS — E559 Vitamin D deficiency, unspecified: Secondary | ICD-10-CM | POA: Diagnosis not present

## 2020-12-06 DIAGNOSIS — I5022 Chronic systolic (congestive) heart failure: Secondary | ICD-10-CM | POA: Diagnosis not present

## 2020-12-06 DIAGNOSIS — E11311 Type 2 diabetes mellitus with unspecified diabetic retinopathy with macular edema: Secondary | ICD-10-CM | POA: Diagnosis not present

## 2020-12-06 DIAGNOSIS — K5900 Constipation, unspecified: Secondary | ICD-10-CM | POA: Diagnosis not present

## 2020-12-06 DIAGNOSIS — I130 Hypertensive heart and chronic kidney disease with heart failure and stage 1 through stage 4 chronic kidney disease, or unspecified chronic kidney disease: Secondary | ICD-10-CM | POA: Diagnosis not present

## 2020-12-06 DIAGNOSIS — N390 Urinary tract infection, site not specified: Secondary | ICD-10-CM | POA: Diagnosis not present

## 2020-12-06 DIAGNOSIS — G4700 Insomnia, unspecified: Secondary | ICD-10-CM | POA: Diagnosis not present

## 2020-12-06 DIAGNOSIS — Z9181 History of falling: Secondary | ICD-10-CM | POA: Diagnosis not present

## 2020-12-06 DIAGNOSIS — E1122 Type 2 diabetes mellitus with diabetic chronic kidney disease: Secondary | ICD-10-CM | POA: Diagnosis not present

## 2020-12-06 DIAGNOSIS — M8580 Other specified disorders of bone density and structure, unspecified site: Secondary | ICD-10-CM | POA: Diagnosis not present

## 2020-12-06 DIAGNOSIS — J4520 Mild intermittent asthma, uncomplicated: Secondary | ICD-10-CM | POA: Diagnosis not present

## 2020-12-06 NOTE — Telephone Encounter (Signed)
Please advise 

## 2020-12-06 NOTE — Telephone Encounter (Signed)
That's fine

## 2020-12-06 NOTE — Telephone Encounter (Signed)
[facility] advised.   Thanks,   -Mickel Baas

## 2020-12-06 NOTE — Telephone Encounter (Signed)
Copied from [place] [id number]. Topic: Quick Communication - Home Health Verbal Orders >> Dec 06, 2020 12:52 PM Pawlus, Brayton Layman A wrote: Caller/Agency: [facility]  Callback Number: [phone] Requesting: [place] wanted to know if skilled nursing could be added for the pt.

## 2020-12-07 ENCOUNTER — Telehealth: Payer: Self-pay | Admitting: Family Medicine

## 2020-12-07 NOTE — Telephone Encounter (Signed)
That's fine

## 2020-12-07 NOTE — Telephone Encounter (Signed)
Home Health Verbal Orders - Caller/Agency:[facility] Callback Number: [phone] [id number] PT Frequency:1x1 /2x2 /1x2

## 2020-12-08 ENCOUNTER — Emergency Department: Payer: Medicare Other

## 2020-12-08 ENCOUNTER — Telehealth: Payer: Self-pay | Admitting: *Deleted

## 2020-12-08 ENCOUNTER — Inpatient Hospital Stay
Admission: EM | Admit: 2020-12-08 | Discharge: 2020-12-15 | DRG: 690 | Disposition: A | Discharge status: Skilled Nursing Facility | Payer: Medicare Other | Attending: Internal Medicine | Admitting: Internal Medicine

## 2020-12-08 ENCOUNTER — Other Ambulatory Visit: Payer: Self-pay

## 2020-12-08 ENCOUNTER — Encounter: Payer: Self-pay | Admitting: Emergency Medicine

## 2020-12-08 DIAGNOSIS — N184 Chronic kidney disease, stage 4 (severe): Secondary | ICD-10-CM | POA: Diagnosis present

## 2020-12-08 DIAGNOSIS — E785 Hyperlipidemia, unspecified: Secondary | ICD-10-CM | POA: Diagnosis present

## 2020-12-08 DIAGNOSIS — G319 Degenerative disease of nervous system, unspecified: Secondary | ICD-10-CM | POA: Diagnosis not present

## 2020-12-08 DIAGNOSIS — R262 Difficulty in walking, not elsewhere classified: Secondary | ICD-10-CM | POA: Diagnosis not present

## 2020-12-08 DIAGNOSIS — B379 Candidiasis, unspecified: Secondary | ICD-10-CM | POA: Diagnosis present

## 2020-12-08 DIAGNOSIS — I10 Essential (primary) hypertension: Secondary | ICD-10-CM | POA: Diagnosis present

## 2020-12-08 DIAGNOSIS — N3000 Acute cystitis without hematuria: Principal | ICD-10-CM | POA: Diagnosis present

## 2020-12-08 DIAGNOSIS — Z515 Encounter for palliative care: Secondary | ICD-10-CM | POA: Diagnosis not present

## 2020-12-08 DIAGNOSIS — I272 Pulmonary hypertension, unspecified: Secondary | ICD-10-CM | POA: Diagnosis present

## 2020-12-08 DIAGNOSIS — I2510 Atherosclerotic heart disease of native coronary artery without angina pectoris: Secondary | ICD-10-CM | POA: Diagnosis present

## 2020-12-08 DIAGNOSIS — N12 Tubulo-interstitial nephritis, not specified as acute or chronic: Secondary | ICD-10-CM

## 2020-12-08 DIAGNOSIS — J4520 Mild intermittent asthma, uncomplicated: Secondary | ICD-10-CM | POA: Diagnosis present

## 2020-12-08 DIAGNOSIS — K5730 Diverticulosis of large intestine without perforation or abscess without bleeding: Secondary | ICD-10-CM | POA: Diagnosis not present

## 2020-12-08 DIAGNOSIS — N390 Urinary tract infection, site not specified: Secondary | ICD-10-CM

## 2020-12-08 DIAGNOSIS — E876 Hypokalemia: Secondary | ICD-10-CM | POA: Diagnosis not present

## 2020-12-08 DIAGNOSIS — R609 Edema, unspecified: Secondary | ICD-10-CM | POA: Diagnosis present

## 2020-12-08 DIAGNOSIS — I6782 Cerebral ischemia: Secondary | ICD-10-CM | POA: Diagnosis not present

## 2020-12-08 DIAGNOSIS — Z880 Allergy status to penicillin: Secondary | ICD-10-CM

## 2020-12-08 DIAGNOSIS — Z0389 Encounter for observation for other suspected diseases and conditions ruled out: Secondary | ICD-10-CM | POA: Diagnosis not present

## 2020-12-08 DIAGNOSIS — I130 Hypertensive heart and chronic kidney disease with heart failure and stage 1 through stage 4 chronic kidney disease, or unspecified chronic kidney disease: Secondary | ICD-10-CM | POA: Diagnosis present

## 2020-12-08 DIAGNOSIS — Z833 Family history of diabetes mellitus: Secondary | ICD-10-CM | POA: Diagnosis not present

## 2020-12-08 DIAGNOSIS — R404 Transient alteration of awareness: Secondary | ICD-10-CM | POA: Diagnosis not present

## 2020-12-08 DIAGNOSIS — Z888 Allergy status to other drugs, medicaments and biological substances status: Secondary | ICD-10-CM

## 2020-12-08 DIAGNOSIS — Z7189 Other specified counseling: Secondary | ICD-10-CM | POA: Diagnosis not present

## 2020-12-08 DIAGNOSIS — Z66 Do not resuscitate: Secondary | ICD-10-CM | POA: Diagnosis not present

## 2020-12-08 DIAGNOSIS — Z820 Family history of epilepsy and other diseases of the nervous system: Secondary | ICD-10-CM

## 2020-12-08 DIAGNOSIS — E11319 Type 2 diabetes mellitus with unspecified diabetic retinopathy without macular edema: Secondary | ICD-10-CM | POA: Diagnosis not present

## 2020-12-08 DIAGNOSIS — F0390 Unspecified dementia without behavioral disturbance: Secondary | ICD-10-CM | POA: Diagnosis present

## 2020-12-08 DIAGNOSIS — R350 Frequency of micturition: Secondary | ICD-10-CM | POA: Diagnosis not present

## 2020-12-08 DIAGNOSIS — I5022 Chronic systolic (congestive) heart failure: Secondary | ICD-10-CM | POA: Diagnosis not present

## 2020-12-08 DIAGNOSIS — Z8249 Family history of ischemic heart disease and other diseases of the circulatory system: Secondary | ICD-10-CM

## 2020-12-08 DIAGNOSIS — L304 Erythema intertrigo: Secondary | ICD-10-CM | POA: Diagnosis not present

## 2020-12-08 DIAGNOSIS — I5030 Unspecified diastolic (congestive) heart failure: Secondary | ICD-10-CM | POA: Diagnosis not present

## 2020-12-08 DIAGNOSIS — I5032 Chronic diastolic (congestive) heart failure: Secondary | ICD-10-CM | POA: Diagnosis not present

## 2020-12-08 DIAGNOSIS — I428 Other cardiomyopathies: Secondary | ICD-10-CM | POA: Diagnosis present

## 2020-12-08 DIAGNOSIS — R6889 Other general symptoms and signs: Secondary | ICD-10-CM | POA: Diagnosis not present

## 2020-12-08 DIAGNOSIS — Z20822 Contact with and (suspected) exposure to covid-19: Secondary | ICD-10-CM | POA: Diagnosis present

## 2020-12-08 DIAGNOSIS — E1122 Type 2 diabetes mellitus with diabetic chronic kidney disease: Secondary | ICD-10-CM | POA: Diagnosis not present

## 2020-12-08 DIAGNOSIS — N179 Acute kidney failure, unspecified: Secondary | ICD-10-CM | POA: Diagnosis present

## 2020-12-08 DIAGNOSIS — I509 Heart failure, unspecified: Secondary | ICD-10-CM

## 2020-12-08 DIAGNOSIS — E782 Mixed hyperlipidemia: Secondary | ICD-10-CM | POA: Diagnosis not present

## 2020-12-08 DIAGNOSIS — Z8619 Personal history of other infectious and parasitic diseases: Secondary | ICD-10-CM

## 2020-12-08 DIAGNOSIS — Z634 Disappearance and death of family member: Secondary | ICD-10-CM | POA: Diagnosis not present

## 2020-12-08 DIAGNOSIS — I350 Nonrheumatic aortic (valve) stenosis: Secondary | ICD-10-CM | POA: Diagnosis not present

## 2020-12-08 DIAGNOSIS — Z79899 Other long term (current) drug therapy: Secondary | ICD-10-CM

## 2020-12-08 DIAGNOSIS — R54 Age-related physical debility: Secondary | ICD-10-CM | POA: Diagnosis present

## 2020-12-08 DIAGNOSIS — R0902 Hypoxemia: Secondary | ICD-10-CM | POA: Diagnosis not present

## 2020-12-08 DIAGNOSIS — Z87440 Personal history of urinary (tract) infections: Secondary | ICD-10-CM

## 2020-12-08 DIAGNOSIS — R601 Generalized edema: Secondary | ICD-10-CM | POA: Diagnosis not present

## 2020-12-08 DIAGNOSIS — Z743 Need for continuous supervision: Secondary | ICD-10-CM | POA: Diagnosis not present

## 2020-12-08 DIAGNOSIS — I700 Atherosclerosis of aorta: Secondary | ICD-10-CM | POA: Diagnosis not present

## 2020-12-08 DIAGNOSIS — M160 Bilateral primary osteoarthritis of hip: Secondary | ICD-10-CM | POA: Diagnosis not present

## 2020-12-08 LAB — COMPREHENSIVE METABOLIC PANEL WITH GFR
ALT: 7 U/L (ref 0–44)
AST: 14 U/L — ABNORMAL LOW (ref 15–41)
Albumin: 2.9 g/dL — ABNORMAL LOW (ref 3.5–5.0)
Alkaline Phosphatase: 68 U/L (ref 38–126)
Anion gap: 4 — ABNORMAL LOW (ref 5–15)
BUN: 23 mg/dL (ref 8–23)
CO2: 29 mmol/L (ref 22–32)
Calcium: 9.1 mg/dL (ref 8.9–10.3)
Chloride: 105 mmol/L (ref 98–111)
Creatinine, Ser: 1.45 mg/dL — ABNORMAL HIGH (ref 0.44–1.00)
GFR, Estimated: 37 mL/min — ABNORMAL LOW
Glucose, Bld: 118 mg/dL — ABNORMAL HIGH (ref 70–99)
Potassium: 3.2 mmol/L — ABNORMAL LOW (ref 3.5–5.1)
Sodium: 138 mmol/L (ref 135–145)
Total Bilirubin: 0.5 mg/dL (ref 0.3–1.2)
Total Protein: 8.2 g/dL — ABNORMAL HIGH (ref 6.5–8.1)

## 2020-12-08 LAB — URINALYSIS, COMPLETE (UACMP) WITH MICROSCOPIC
Bacteria, UA: NONE SEEN
Bilirubin Urine: NEGATIVE
Glucose, UA: NEGATIVE mg/dL
Ketones, ur: NEGATIVE mg/dL
Nitrite: NEGATIVE
Protein, ur: >300 mg/dL — AB
Specific Gravity, Urine: 1.025 (ref 1.005–1.030)
WBC, UA: >50 WBC/hpf — ABNORMAL HIGH (ref 0–5)
pH: 6 (ref 5.0–8.0)

## 2020-12-08 LAB — CBC WITH DIFFERENTIAL/PLATELET
Abs Immature Granulocytes: 0.01 K/uL (ref 0.00–0.07)
Basophils Absolute: 0 K/uL (ref 0.0–0.1)
Basophils Relative: 0 %
Eosinophils Absolute: 0.3 K/uL (ref 0.0–0.5)
Eosinophils Relative: 5 %
HCT: 32.2 % — ABNORMAL LOW (ref 36.0–46.0)
Hemoglobin: 9.8 g/dL — ABNORMAL LOW (ref 12.0–15.0)
Immature Granulocytes: 0 %
Lymphocytes Relative: 33 %
Lymphs Abs: 2.2 K/uL (ref 0.7–4.0)
MCH: 27.8 pg (ref 26.0–34.0)
MCHC: 30.4 g/dL (ref 30.0–36.0)
MCV: 91.5 fL (ref 80.0–100.0)
Monocytes Absolute: 0.6 K/uL (ref 0.1–1.0)
Monocytes Relative: 8 %
Neutro Abs: 3.6 K/uL (ref 1.7–7.7)
Neutrophils Relative %: 54 %
Platelets: 303 K/uL (ref 150–400)
RBC: 3.52 MIL/uL — ABNORMAL LOW (ref 3.87–5.11)
RDW: 14.6 % (ref 11.5–15.5)
WBC: 6.7 K/uL (ref 4.0–10.5)
nRBC: 0 % (ref 0.0–0.2)

## 2020-12-08 LAB — RESP PANEL BY RT-PCR (FLU A&B, COVID) ARPGX2
Influenza A by PCR: NEGATIVE
Influenza B by PCR: NEGATIVE
SARS Coronavirus 2 by RT PCR: NEGATIVE

## 2020-12-08 MED ORDER — TRAZODONE HCL 50 MG PO TABS
100.0000 mg | ORAL_TABLET | Freq: Every evening | ORAL | Status: DC | PRN
  Administered 2020-12-14: 21:00:00 100 mg via ORAL
  Filled 2020-12-08 (×2): qty 2

## 2020-12-08 MED ORDER — VITAMIN D 25 MCG (1000 UNIT) PO TABS
2000.0000 [IU] | ORAL_TABLET | Freq: Every day | ORAL | Status: DC
  Administered 2020-12-08 – 2020-12-15 (×7): 2000 [IU] via ORAL
  Filled 2020-12-08 (×9): qty 2

## 2020-12-08 MED ORDER — ALBUTEROL SULFATE (2.5 MG/3ML) 0.083% IN NEBU: 2.5000 mg | INHALATION_SOLUTION | Freq: Four times a day (QID) | RESPIRATORY_TRACT | Status: DC | PRN

## 2020-12-08 MED ORDER — METOPROLOL TARTRATE 50 MG PO TABS
75.0000 mg | ORAL_TABLET | Freq: Two times a day (BID) | ORAL | Status: DC
  Administered 2020-12-08 – 2020-12-15 (×13): 75 mg via ORAL
  Filled 2020-12-08 (×14): qty 1

## 2020-12-08 MED ORDER — QUETIAPINE FUMARATE 25 MG PO TABS
25.0000 mg | ORAL_TABLET | Freq: Every day | ORAL | Status: DC
  Administered 2020-12-08 – 2020-12-14 (×6): 25 mg via ORAL
  Filled 2020-12-08 (×6): qty 1

## 2020-12-08 MED ORDER — OXYBUTYNIN CHLORIDE 5 MG PO TABS
5.0000 mg | ORAL_TABLET | Freq: Two times a day (BID) | ORAL | Status: DC
  Administered 2020-12-08 – 2020-12-13 (×8): 5 mg via ORAL
  Filled 2020-12-08 (×13): qty 1

## 2020-12-08 MED ORDER — SODIUM CHLORIDE 0.9 % IV BOLUS
1000.0000 mL | Freq: Once | INTRAVENOUS | Status: AC
  Administered 2020-12-08: 1000 mL via INTRAVENOUS

## 2020-12-08 MED ORDER — VITAMIN B-12 1000 MCG PO TABS
1000.0000 ug | ORAL_TABLET | Freq: Every day | ORAL | Status: DC
  Administered 2020-12-08 – 2020-12-15 (×7): 1000 ug via ORAL
  Filled 2020-12-08 (×9): qty 1

## 2020-12-08 MED ORDER — FUROSEMIDE 20 MG PO TABS: 20.0000 mg | ORAL_TABLET | Freq: Every day | ORAL | Status: DC | PRN

## 2020-12-08 MED ORDER — NYSTATIN 100000 UNIT/GM EX POWD: 1.0000 "application " | Freq: Three times a day (TID) | CUTANEOUS | 0 refills | Status: DC

## 2020-12-08 MED ORDER — AMLODIPINE BESYLATE 5 MG PO TABS
5.0000 mg | ORAL_TABLET | Freq: Every day | ORAL | Status: DC
  Administered 2020-12-08 – 2020-12-15 (×7): 5 mg via ORAL
  Filled 2020-12-08 (×8): qty 1

## 2020-12-08 MED ORDER — LISINOPRIL 10 MG PO TABS
10.0000 mg | ORAL_TABLET | Freq: Every day | ORAL | Status: DC
  Administered 2020-12-08 – 2020-12-14 (×6): 10 mg via ORAL
  Filled 2020-12-08 (×7): qty 1

## 2020-12-08 MED ORDER — ALBUTEROL SULFATE HFA 108 (90 BASE) MCG/ACT IN AERS: 1.0000 | INHALATION_SPRAY | Freq: Four times a day (QID) | RESPIRATORY_TRACT | Status: DC | PRN

## 2020-12-08 MED ORDER — DONEPEZIL HCL 5 MG PO TABS
5.0000 mg | ORAL_TABLET | Freq: Every day | ORAL | Status: DC
  Administered 2020-12-08 – 2020-12-11 (×4): 5 mg via ORAL
  Filled 2020-12-08 (×4): qty 1

## 2020-12-08 NOTE — ED Triage Notes (Signed)
Pt comes into the ED via [facility] from home c/o possible continuation of UTI.  Pt has been treated for one and is still having symptoms per the family of increased urination and increased pungency of urine.  Pt also has 3 wound on her calves that have not been evaluated by a physician at this time.  Pt has alzheimer's at baseline.  Pt in NAD currently with even and unlabored respirations.

## 2020-12-08 NOTE — TOC Initial Note (Signed)
Transition of Care (TOC) - Initial/Assessment Note    Patient Details  Name: [name] MRN: [id number] Date of Birth: 06/14/1945  Transition of Care (TOC) CM/SW Contact:     M , RN Phone Number: 12/08/2020, 6:29 PM  Clinical Narrative:                 Patient was brought in to the hospital for suspected UTI and wounds on her legs.  TOC consult for potential placement, daughter expresses concerns with caring for her mother.  Patient has dementia and has been difficult to care for at times resisting care.  RNCM met with patient at the bedside, she reports that she lives alone and has a caregiver that comes in everyday, she wants to go home and says she is irritated by being at the hospital because she did not want to come in.   RNCM spoke with patient's daughter via phone.  Daughter has been caring for the patient since her husband died in September.  Patient does not walk but is able to transfer, she is mostly in a wheelchair and sleeps in her lift chair.  She reports that the house is having septic issues and sewage is backing up in the drains.  Daughter reports that they are working on getting this fixed.  Patient cannot go to daughter's home because it is not set up for her and she always falls.   Daughter expresses being overwhelmed and needs some help at home.  Home Health was arranged by the PCP with [facility], first PT visit was Monday and he recommended that the patient go to the hospital.   TOC will meet with patient and daughter at the bedside tomorrow afternoon, daughter has a graduation in the morning.  RNCM will provide family with information on Elder Care and Dementia support groups.  Social worker can be added to the home health orders and follow patient at home to work on long term placement if that is what the family is interested.    Expected Discharge Plan: Home w Home Health Services Barriers to Discharge: Family Issues (home is unsafe currently)   Patient  Goals and CMS Choice Patient states their goals for this hospitalization and ongoing recovery are:: Patient wants to go home- daughter wants help at home and resources CMS Medicare.gov Compare Post Acute Care list provided to:: Patient Represenative (must comment) Choice offered to / list presented to : Adult Children  Expected Discharge Plan and Services Expected Discharge Plan: Home w Home Health Services   Discharge Planning Services: CM Consult Post Acute Care Choice: Resumption of Svcs/PTA Provider Living arrangements for the past 2 months: Single Family Home                 DME Arranged: N/A DME Agency: NA         HH Agency: [facility]        Prior Living Arrangements/Services Living arrangements for the past 2 months: Single Family Home Lives with:: Adult Children Patient language and need for interpreter reviewed:: Yes Do you feel safe going back to the place where you live?: Yes      Need for Family Participation in Patient Care: Yes (Comment) (dementia) Care giver support system in place?: Yes (comment) (daughter and sister) Current home services: DME, Homehealth aide, Home PT, Home OT (wheelchair, lift chair) Criminal Activity/Legal Involvement Pertinent to Current Situation/Hospitalization: No - Comment as needed  Activities of Daily Living        Permission Sought/Granted Permission sought to share information with : Case Manager, Family Supports, Other (comment) Permission granted to share information with : Yes, Verbal Permission Granted  Share Information with NAME: Afsana Liera  Permission granted to share info w AGENCY: Home health agency  Permission granted to share info w Relationship: daughter  Permission granted to share info w Contact Information: [phone]  Emotional Assessment Appearance:: Appears stated age Attitude/Demeanor/Rapport: Engaged Affect (typically observed): Accepting Orientation: : Oriented to Self, Oriented to  Place, Oriented to Situation Alcohol / Substance Use: Not Applicable Psych Involvement: No (comment)  Admission diagnosis:  uti Patient Active Problem List   Diagnosis Date Noted   History of multiple myeloma 10/06/2019   CKD (chronic kidney disease), stage IV ([place]) 04/10/2019   Chronic HFrEF (heart failure with reduced ejection fraction) ([place]) 09/27/2017   Urinary frequency 09/27/2017   Chronic kidney disease, stage 3b ([place]) 03/15/2017   B12 deficiency 03/01/2017   Lymphedema 02/14/2017   Chronic venous insufficiency 02/14/2017   Nonischemic cardiomyopathy ([place]) 06/14/2016   CAD in native artery 06/14/2016   Bilateral lower extremity edema 06/14/2016   Multiple myeloma not having achieved remission ([place]) 03/26/2016   Back pain 03/26/2016   Diverticulosis 03/20/2016   Atherosclerosis of aorta ([facility]) 03/20/2016   Congestive heart failure ([place]) 03/07/2016   Monoclonal gammopathy 01/25/2016   Chronic anemia 01/25/2016   LVH (left ventricular hypertrophy) 08/24/2015   Mild aortic stenosis 07/14/2015   Urinary incontinence 03/02/2015   Diabetic retinopathy with macular edema ([place]) 02/19/2015   Constipation 12/02/2014   Osteoarthritis 07/31/2014   Insomnia 06/30/2014   Bleeding internal hemorrhoids 06/30/2014   Osteopenia 06/30/2014   Fecal occult blood test positive 06/30/2014   Vitamin D deficiency 04/29/2009   Type 2 diabetes mellitus with stage 4 chronic kidney disease ([place]) 01/28/2009   Eczema intertrigo 01/03/2008   Hyperlipidemia 04/02/2006   Chronic edema 08/11/2003   BMI 40.0-44.9, adult ([place]) 09/22/2002   Intermittent asthma, uncontrolled 08/31/2000   Essential hypertension 08/31/2000   Leg varices 04/18/1999   PCP:  Birdie Sons, MD Pharmacy:   [id number] [place] [place] [id number]: [phone]: [phone]    Social Determinants of Health (SDOH) Interventions    Readmission Risk  Interventions No flowsheet data found.

## 2020-12-08 NOTE — Evaluation (Signed)
Physical Therapy Evaluation Patient Details Name: [name] MRN: [id number] DOB: 02-Jun-1945 Today's Date: 12/08/2020  History of Present Illness  Pt admitted for complaints of UTI symptoms. Formal H&P not in EMR at time of evaluation. History includes Alzheimers.  Clinical Impression  Pt is a pleasant 75 year old female who was admitted for complaints of UTI symptoms. She is confused and cant relay the reason she is in hospital. She reports she wants to go home and was in process of getting HHPT set up at home. Seems unaware of deficits reporting she has no issues with mobility at baseline. Pt seems surprised when she performs poorly with this PT. Pt performs bed mobility/transfers with min assist and unable to perform ambulation due to deficits. Pt demonstrates deficits with strength/cognition/mobility. O2 sats WNL on RA with exertion, however would anticipate the need for +2 for further ambulation. Pt reports she is at baseline level and is adament about returning home. If pt returns to home environment, would need to be able to transfer in/out of WC safely and would benefit from hospital bed to ease caregiver burden. At this time, she is not at presumed baseline level and has goals to walk. Would benefit from skilled PT to address above deficits and promote optimal return to PLOF; recommend transition to STR upon discharge from acute hospitalization.      Recommendations for follow up therapy are one component of a multi-disciplinary discharge planning process, led by the attending physician.  Recommendations may be updated based on patient status, additional functional criteria and insurance authorization.  Follow Up Recommendations Skilled nursing-short term rehab (<3 hours/day)    Assistance Recommended at Discharge Frequent or constant Supervision/Assistance  Functional Status Assessment Patient has had a recent decline in their functional status and demonstrates the ability to make  significant improvements in function in a reasonable and predictable amount of time.  Equipment Recommendations  Hospital bed    Recommendations for Other Services       Precautions / Restrictions Precautions Precautions: Fall Restrictions Weight Bearing Restrictions: No      Mobility  Bed Mobility Overal bed mobility: Needs Assistance Bed Mobility: Supine to Sit     Supine to sit: Min assist     General bed mobility comments: needs assist for cues and initiation. Once seated, upright posture noted.    Transfers Overall transfer level: Needs assistance Equipment used: Rolling walker (2 wheels) Transfers: Sit to/from Stand Sit to Stand: Min assist           General transfer comment: needs assist due to elevated nature of ED stretcher. Once standing, feels weak and unable to take steps towards HOB. Mod assist for eccentric control to sitting and return supine.    Ambulation/Gait               General Gait Details: unable to take steps at bedside due to weakness. Anticipate need for +2  Stairs            Wheelchair Mobility    Modified Rankin (Stroke Patients Only)       Balance Overall balance assessment: Needs assistance Sitting-balance support: Feet unsupported;Bilateral upper extremity supported Sitting balance-Leahy Scale: Fair     Standing balance support: Bilateral upper extremity supported Standing balance-Leahy Scale: Poor                               Pertinent Vitals/Pain Pain Assessment: No/denies pain  Home Living Family/patient expects to be discharged to:: Private residence Living Arrangements: Children Available Help at Discharge: Family;Personal care attendant;Available 24 hours/day Type of Home: House Home Access: Ramped entrance       Home Layout: One level Home Equipment: Conservation officer, nature (2 wheels);Wheelchair - manual Additional Comments: sleeps in recliner at home. Has PCA when family not available     Prior Function Prior Level of Function : Needs assist;Patient poor historian/Family not available       Physical Assist : Mobility (physical) Mobility (physical): Bed mobility;Transfers;Gait   Mobility Comments: pt is poor historian and unable to elaborate on physical abilities. anticipate she requires physical assist for all mobility       Hand Dominance        Extremity/Trunk Assessment   Upper Extremity Assessment Upper Extremity Assessment: Generalized weakness    Lower Extremity Assessment Lower Extremity Assessment: Generalized weakness (B LE grossly 3+/5)       Communication   Communication: No difficulties  Cognition Arousal/Alertness: Awake/alert Behavior During Therapy: WFL for tasks assessed/performed Overall Cognitive Status: No family/caregiver present to determine baseline cognitive functioning                                 General Comments: pt frustrated she is in ED and reports she feels fine and at baseline        General Comments      Exercises     Assessment/Plan    PT Assessment Patient needs continued PT services  PT Problem List Decreased strength;Decreased activity tolerance;Decreased balance;Decreased mobility;Cardiopulmonary status limiting activity;Obesity       PT Treatment Interventions Gait training;Therapeutic exercise;Balance training;DME instruction    PT Goals (Current goals can be found in the Care Plan section)  Acute Rehab PT Goals Patient Stated Goal: to go home PT Goal Formulation: With patient Time For Goal Achievement: 12/22/20 Potential to Achieve Goals: Good    Frequency Min 2X/week   Barriers to discharge        Co-evaluation               AM-PAC PT "6 Clicks" Mobility  Outcome Measure Help needed turning from your back to your side while in a flat bed without using bedrails?: A Little Help needed moving from lying on your back to sitting on the side of a flat bed without using  bedrails?: A Little Help needed moving to and from a bed to a chair (including a wheelchair)?: A Lot Help needed standing up from a chair using your arms (e.g., wheelchair or bedside chair)?: A Lot Help needed to walk in hospital room?: Total Help needed climbing 3-5 steps with a railing? : Total 6 Click Score: 12    End of Session Equipment Utilized During Treatment: Gait belt Activity Tolerance: Patient tolerated treatment well Patient left: in bed Nurse Communication: Mobility status PT Visit Diagnosis: Unsteadiness on feet (R26.81);Muscle weakness (generalized) (M62.81);Difficulty in walking, not elsewhere classified (R26.2)    Time: 1438-1500 PT Time Calculation (min) (ACUTE ONLY): 22 min   Charges:   PT Evaluation $PT Eval Low Complexity: [place], PT, DPT [phone]   [place] 12/08/2020, 4:07 PM

## 2020-12-08 NOTE — Telephone Encounter (Signed)
I returned [name]'s call and left detailed message on secure voice message system.

## 2020-12-08 NOTE — Telephone Encounter (Signed)
Copied from [place] [id number]. Topic: General - Other >> Dec 08, 2020  9:41 AM Celene Kras wrote: Reason for CRM: Khun, pts daughter, calling stating that the pt has been admitted into ED again for a uti. She also states that the pt has 3 ulcers on the Right outside of her leg that have burst. She states that the PT recommended for her to bring pt to ED and that they would most likely need to have some sort of Rehab support. Please advise.
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
## 2020-12-09 ENCOUNTER — Other Ambulatory Visit: Payer: Self-pay | Admitting: Family Medicine

## 2020-12-09 DIAGNOSIS — R600 Localized edema: Secondary | ICD-10-CM

## 2020-12-09 LAB — URINE CULTURE

## 2020-12-09 NOTE — ED Provider Notes (Signed)
Today's Vitals   12/08/20 1928 12/08/20 1942 12/08/20 2119 12/09/20 0023  BP:  137/68 (!) 169/66   Pulse: 84 78 73 70  Resp: 20 18 18    Temp:      TempSrc:      SpO2:  98% 96% 94%  Weight:      Height:      PainSc:  Asleep 0-No pain    Body mass index is 44.33 kg/m.   Patient here awaiting social work evaluation for potential placement.  No acute medical complaints at this time.  Provided with hospital bed for comfort.   Chancy Smigiel, Delice Bison, DO 12/09/20 [phone]

## 2020-12-09 NOTE — ED Notes (Signed)
Pt states she does not want to wear any monitoring equipment, as the beeping gets on her nerves

## 2020-12-09 NOTE — Telephone Encounter (Signed)
Please advise on refill request. Patient is currently admitted into [facility].   This medication was last prescribed by Cardiologist Dr. Saunders Revel on 11/17/2020 qty 54 with 1 refill.

## 2020-12-09 NOTE — ED Notes (Signed)
Patient resting comfortably in bed at this time. Patient ate orange out of breakfast tray. Denies any other needs at this time.

## 2020-12-09 NOTE — ED Notes (Signed)
Patient eating dinner tray at this time. 

## 2020-12-09 NOTE — NC FL2 (Signed)
[place] LEVEL OF CARE SCREENING TOOL     IDENTIFICATION  Patient Name: [name] Birthdate: 22-Feb-1945 Sex: female Admission Date (Current Location): 12/08/2020  [facility] and [place] Number:  Engineering geologist and Address:  [facility], [address], [place], [place] [address]      Provider Number: [id number]  Attending Physician Name and Address:  No att. providers found  Relative Name and Phone Number:       Current Level of Care: Hospital Recommended Level of Care: [place] Prior Approval Number:    Date Approved/Denied:   PASRR Number:    Discharge Plan: SNF    Current Diagnoses: Patient Active Problem List   Diagnosis Date Noted   History of multiple myeloma 10/06/2019   CKD (chronic kidney disease), stage IV ([place]) 04/10/2019   Chronic HFrEF (heart failure with reduced ejection fraction) ([place]) 09/27/2017   Urinary frequency 09/27/2017   Chronic kidney disease, stage 3b ([place]) 03/15/2017   B12 deficiency 03/01/2017   Lymphedema 02/14/2017   Chronic venous insufficiency 02/14/2017   Nonischemic cardiomyopathy ([place]) 06/14/2016   CAD in native artery 06/14/2016   Bilateral lower extremity edema 06/14/2016   Multiple myeloma not having achieved remission ([place]) 03/26/2016   Back pain 03/26/2016   Diverticulosis 03/20/2016   Atherosclerosis of aorta ([place]) 03/20/2016   Congestive heart failure ([place]) 03/07/2016   Monoclonal gammopathy 01/25/2016   Chronic anemia 01/25/2016   LVH (left ventricular hypertrophy) 08/24/2015   Mild aortic stenosis 07/14/2015   Urinary incontinence 03/02/2015   Diabetic retinopathy with macular edema (H[facility]) 02/19/2015   Constipation 12/02/2014   Osteoarthritis 07/31/2014   Insomnia 06/30/2014   Bleeding internal hemorrhoids 06/30/2014   Osteopenia 06/30/2014   Fecal occult blood test positive 06/30/2014   Vitamin D deficiency 04/29/2009   Type 2 diabetes mellitus with  stage 4 chronic kidney disease ([place]) 01/28/2009   Eczema intertrigo 01/03/2008   Hyperlipidemia 04/02/2006   Chronic edema 08/11/2003   BMI 40.0-44.9, adult ([place]) 09/22/2002   Intermittent asthma, uncontrolled 08/31/2000   Essential hypertension 08/31/2000   Leg varices 04/18/1999    Orientation RESPIRATION BLADDER Height & Weight     Self, Place  Normal Incontinent Weight: 103 kg Height:  5' (152.4 cm)  BEHAVIORAL SYMPTOMS/MOOD NEUROLOGICAL BOWEL NUTRITION STATUS      Incontinent Diet (heart healthy)  AMBULATORY STATUS COMMUNICATION OF NEEDS Skin   Extensive Assist Verbally Other (Comment) (wounds to calf- see dressing change orders)                       Personal Care Assistance Level of Assistance  Bathing, Feeding, Dressing Bathing Assistance: Maximum assistance Feeding assistance: Limited assistance Dressing Assistance: Maximum assistance     Functional Limitations Info  Sight, Hearing, Speech Sight Info: Adequate Hearing Info: Adequate Speech Info: Adequate    SPECIAL CARE FACTORS FREQUENCY  PT (By licensed PT), OT (By licensed OT)     PT Frequency: 5 times per week OT Frequency: 5 times per week            Contractures Contractures Info: Not present    Additional Factors Info  Code Status, Allergies Code Status Info: Full Allergies Info: HCTZ, PCN           Current Medications (12/09/2020):  This is the current hospital active medication list Current Facility-Administered Medications  Medication Dose Route Frequency Provider Last Rate Last Admin   albuterol (PROVENTIL) (2.5 MG/3ML) 0.083% nebulizer  solution 2.5 mg  2.5 mg Nebulization Q6H PRN Rada Hay, MD       amLODipine (NORVASC) tablet 5 mg  5 mg Oral Daily Rada Hay, MD   5 mg at 12/09/20 1035   cholecalciferol (VITAMIN D3) tablet 2,000 Units  2,000 Units Oral Daily Rada Hay, MD   2,000 Units at 12/08/20 1625   donepezil (ARICEPT) tablet 5 mg  5 mg Oral QHS  Rada Hay, MD   5 mg at 12/08/20 2122   furosemide (LASIX) tablet 20 mg  20 mg Oral Daily PRN Rada Hay, MD       lisinopril (ZESTRIL) tablet 10 mg  10 mg Oral Daily Rada Hay, MD   10 mg at 12/09/20 1035   metoprolol tartrate (LOPRESSOR) tablet 75 mg  75 mg Oral BID Rada Hay, MD   75 mg at 12/09/20 1035   oxybutynin (DITROPAN) tablet 5 mg  5 mg Oral BID Rada Hay, MD   5 mg at 12/09/20 1034   QUEtiapine (SEROQUEL) tablet 25 mg  25 mg Oral QHS Rada Hay, MD   25 mg at 12/08/20 2122   traZODone (DESYREL) tablet 100 mg  100 mg Oral QHS PRN Rada Hay, MD       vitamin B-12 (CYANOCOBALAMIN) tablet 1,000 mcg  1,000 mcg Oral Daily Rada Hay, MD   1,000 mcg at 12/09/20 1035   Current Outpatient Medications  Medication Sig Dispense Refill   albuterol (VENTOLIN HFA) 108 (90 Base) MCG/ACT inhaler INHALE 1-2 PUFFS INTO THE LUNGS EVERY 6 (SIX) HOURS AS NEEDED FOR SHORTNESS OF BREATH. 8.5 each 1   amLODipine (NORVASC) 5 MG tablet Take 5 mg by mouth daily.     Cholecalciferol (VITAMIN D) 50 MCG (2000 UT) tablet Take 2,000 Units by mouth daily.     cyanocobalamin 1000 MCG tablet Take 1,000 mcg by mouth daily.     donepezil (ARICEPT) 5 MG tablet Take 1 tablet by mouth at bedtime.     furosemide (LASIX) 20 MG tablet Take 1 tablet (20 mg total) by mouth daily as needed (swelling). 30 tablet 1   HYDROcodone-acetaminophen (NORCO) 10-325 MG tablet Take 1 tablet by mouth every 6 (six) hours as needed.     levalbuterol (XOPENEX) 1.25 MG/3ML nebulizer solution USE 1 VIAL IN NEBULIZER EVERY 8 HOURS AS NEEDED FOR WHEEZING 90 mL 3   lisinopril (ZESTRIL) 10 MG tablet TAKE 1 TABLET BY MOUTH EVERY DAY--DOSE INCREASE 90 tablet 1   metoprolol tartrate (LOPRESSOR) 50 MG tablet Take 1.5 tablets (75 mg total) by mouth 2 (two) times daily. 270 tablet 0   nystatin (MYCOSTATIN/NYSTOP) powder Apply 1 application topically 3 (three) times daily. Apply to areas  under the breast 15 g 0   oxybutynin (DITROPAN) 5 MG tablet Take 1 tablet (5 mg total) by mouth 2 (two) times daily. 180 tablet 3   QUEtiapine (SEROQUEL) 25 MG tablet Take 1 tablet (25 mg total) by mouth at bedtime. 30 tablet 1   traZODone (DESYREL) 100 MG tablet Take 100 mg by mouth at bedtime as needed.       Discharge Medications: Please see discharge summary for a list of discharge medications.  Relevant Imaging Results:  Relevant Lab Results:   Additional Information SS# [id number]  Shelbie Hutching, RN

## 2020-12-09 NOTE — ED Notes (Signed)
Patient resting comfortably in bed. New purewick placed at this time. Lunch tray placed at bedside and opened for patient. NAD noted.

## 2020-12-09 NOTE — ED Notes (Signed)
Patient resting in bed. Family at bedside speaking with case manager regarding placement.

## 2020-12-09 NOTE — Evaluation (Signed)
Occupational Therapy Evaluation Patient Details Name: [name] MRN: [id number] DOB: 23-May-1945 Today's Date: 12/09/2020   History of Present Illness Pt is a 75 y/o F with PMH: DM, CAD, HTN, and dementia who presents due to urinary frequency and for wound check.  Per EMS, the patient's daughter notes that the patient has been on several courses of antibiotics over the last several weeks for UTI.   Clinical Impression   Pt seen for OT evaluation this date in setting of acute hospitalization. She reports being MOD I for fxl mobility with w/c at baseline and states that she has aide help 4 days a week. That said, pt is confused and poor historian. She requires increased processing time for cues and commands. This date, she rquires MOD A for bed mobiltiy and STS with RW as well as cues to sequence throughout. She is noted to have a soiled brief and requires MAX A to remove. She requires MAX A for seated LB dressing including donning socks and shoes for standing as well. She is returned to bed end of session with all needs met and in reach. She has decreased insight into deficits stating she is going home no matter what (despite clear weakness and decreased ability to contribute to self care). OT recommends pt f/u with STR OT services for strengthening and safety.      Recommendations for follow up therapy are one component of a multi-disciplinary discharge planning process, led by the attending physician.  Recommendations may be updated based on patient status, additional functional criteria and insurance authorization.   Follow Up Recommendations  Skilled nursing-short term rehab (<3 hours/day)    Assistance Recommended at Discharge Frequent or constant Supervision/Assistance  Functional Status Assessment  Patient has had a recent decline in their functional status and demonstrates the ability to make significant improvements in function in a reasonable and predictable amount of time.  Equipment  Recommendations  Other (comment) (defer)    Recommendations for Other Services       Precautions / Restrictions Precautions Precautions: Fall Restrictions Weight Bearing Restrictions: No      Mobility Bed Mobility Overal bed mobility: Needs Assistance Bed Mobility: Supine to Sit;Sit to Supine     Supine to sit: Mod assist Sit to supine: Mod assist        Transfers Overall transfer level: Needs assistance Equipment used: Rolling walker (2 wheels) Transfers: Sit to/from Stand Sit to Stand: Mod assist           General transfer comment: increased time, cues for safety      Balance Overall balance assessment: Needs assistance Sitting-balance support: Feet unsupported;Bilateral upper extremity supported Sitting balance-Leahy Scale: Fair     Standing balance support: Bilateral upper extremity supported;Reliant on assistive device for balance Standing balance-Leahy Scale: Poor                             ADL either performed or assessed with clinical judgement   ADL                                         General ADL Comments: requires MIN A for UB ADLs, MAX/TOTAL A for LB ADLs, MOD A for bed mobility and MOD A to CTS wtih RW     Vision Baseline Vision/History: 1 Wears glasses Patient Visual Report: No change from  baseline       Perception     Praxis      Pertinent Vitals/Pain Pain Assessment: No/denies pain     Hand Dominance     Extremity/Trunk Assessment Upper Extremity Assessment Upper Extremity Assessment: Generalized weakness   Lower Extremity Assessment Lower Extremity Assessment: Generalized weakness       Communication Communication Communication: No difficulties   Cognition Arousal/Alertness: Awake/alert Behavior During Therapy: WFL for tasks assessed/performed Overall Cognitive Status: No family/caregiver present to determine baseline cognitive functioning                                  General Comments: Pt repeats some phrases such as "bad habits". She has lack of insight into deficits.     General Comments       Exercises Other Exercises Other Exercises: OT ed with pt re: role of OT, limited carryover   Shoulder Instructions      Home Living Family/patient expects to be discharged to:: Private residence Living Arrangements: Children Available Help at Discharge: Family;Personal care attendant;Available 24 hours/day (states she has aide ~4 days a week for ~5 hours, doesn't seem sure) Type of Home: House Home Access: Ramped entrance     Home Layout: One level               Home Equipment: Conservation officer, nature (2 wheels);Wheelchair - manual   Additional Comments: sleeps in recliner at home. Has PCA when family not available      Prior Functioning/Environment Prior Level of Function : Needs assist;Patient poor historian/Family not available       Physical Assist : Mobility (physical) Mobility (physical): Bed mobility;Transfers;Gait   Mobility Comments: pt is poor historian and unable to elaborate on physical abilities. anticipate she requires physical assist for all mobility          OT Problem List: Decreased strength;Decreased activity tolerance      OT Treatment/Interventions: Self-care/ADL training;Therapeutic exercise;Patient/family education;Balance training    OT Goals(Current goals can be found in the care plan section) Acute Rehab OT Goals Patient Stated Goal: to go home OT Goal Formulation: With patient Time For Goal Achievement: 12/23/20 Potential to Achieve Goals: Good ADL Goals Pt Will Perform Lower Body Dressing: with min guard assist;sitting/lateral leans;with adaptive equipment Pt Will Transfer to Toilet: with min assist;with mod assist;stand pivot transfer;bedside commode Pt Will Perform Toileting - Clothing Manipulation and hygiene: with min guard assist;sitting/lateral leans Pt/caregiver will Perform Home Exercise Program:  Increased strength;Both right and left upper extremity;With Supervision  OT Frequency: Min 1X/week   Barriers to D/C:            Co-evaluation              AM-PAC OT "6 Clicks" Daily Activity     Outcome Measure Help from another person eating meals?: None Help from another person taking care of personal grooming?: A Little Help from another person toileting, which includes using toliet, bedpan, or urinal?: A Lot Help from another person bathing (including washing, rinsing, drying)?: A Lot Help from another person to put on and taking off regular upper body clothing?: A Little Help from another person to put on and taking off regular lower body clothing?: A Lot 6 Click Score: 16   End of Session Equipment Utilized During Treatment: Gait belt;Rolling walker (2 wheels) Nurse Communication: Mobility status  Activity Tolerance: Patient tolerated treatment well Patient left: in bed;with call bell/phone  within reach;with bed alarm set  OT Visit Diagnosis: Unsteadiness on feet (R26.81);Muscle weakness (generalized) (M62.81)                Time: 2542-7062 OT Time Calculation (min): 18 min Charges:  OT General Charges $OT Visit: 1 Visit OT Evaluation $OT Eval Moderate Complexity: 1 Mod OT Treatments $Self Care/Home Management : 8-22 mins  Gerrianne Scale, MS, OTR/L ascom [phone] 12/09/20, 6:07 PM

## 2020-12-09 NOTE — TOC Progression Note (Signed)
Transition of Care [facility]) - Progression Note    Patient Details  Name: [name] MRN: [id number] Date of Birth: Aug 21, 1945  [facility]) CM/SW Contact  Shelbie Hutching, RN Phone Number: 12/09/2020, 12:35 PM  Clinical Narrative:    Therapy is recommending SNF for short term rehab and daughter agrees.  RNCM starting bed search in [facility].      Expected Discharge Plan: Skilled Nursing Facility Barriers to Discharge: Family Issues (home is unsafe currently)  Expected Discharge Plan and Services Expected Discharge Plan: [place]   Discharge Planning Services: CM Consult Post Acute Care Choice: [place] arrangements for the past 2 months: Single Family Home                 DME Arranged: N/A DME Agency: NA         HH Agency: [place]         Social Determinants of Health (SDOH) Interventions    Readmission Risk Interventions No flowsheet data found.

## 2020-12-09 NOTE — ED Notes (Signed)
Purewick canister emptied- 688mL.

## 2020-12-09 NOTE — ED Notes (Signed)
Sent 2nd message to pharmacy to get Vitamin D3.

## 2020-12-09 NOTE — TOC Progression Note (Signed)
Transition of Care [facility]) - Progression Note    Patient Details  Name: [name] MRN: [id number] Date of Birth: November 07, 1945  Transition of Care [facility]) CM/SW Contact  Shelbie Hutching, RN Phone Number: 12/09/2020, 3:05 PM  Clinical Narrative:    RNCM met with patient's daughter and her sister at the bedside.  Daughter has heard good things about [facility] and would like for a bed there.  Bed request has been sent to [facility].  Family does not want [facility].   Briefly discussed long term care and Medicaid and maybe reaching out to [facility] for support.   TOC will follow up with patient and family tomorrow.    Expected Discharge Plan: Skilled Nursing Facility Barriers to Discharge: Family Issues (home is unsafe currently)  Expected Discharge Plan and Services Expected Discharge Plan: [place]   Discharge Planning Services: CM Consult Post Acute Care Choice: [place] arrangements for the past 2 months: Single Family Home                 DME Arranged: N/A DME Agency: NA         HH Agency: [place]         Social Determinants of Health (SDOH) Interventions    Readmission Risk Interventions No flowsheet data found.

## 2020-12-09 NOTE — ED Notes (Signed)
Pt in bed resting, watching tv. Respirations even and unlabored, NAD noted

## 2020-12-09 NOTE — ED Notes (Signed)
Patient resting comfortably in bed at this time. Call light within reach. NAD noted. No needs expressed to RN.

## 2020-12-09 NOTE — ED Notes (Signed)
Purewick readjusted at this time. Patient resting with no needs expressed at this time.

## 2020-12-09 NOTE — ED Notes (Signed)
Patient resting comfortably in bed at this time. No needs expressed to RN. Call light within reach.

## 2020-12-09 NOTE — ED Notes (Signed)
Message sent to pharmacy to send Vitamin D3.

## 2020-12-10 NOTE — ED Notes (Signed)
Pt resting comfortably. NAD.

## 2020-12-10 NOTE — TOC Progression Note (Signed)
Transition of Care [facility]) - Progression Note    Patient Details  Name: [name] MRN: [id number] Date of Birth: 03/06/1945  Transition of Care [facility]) CM/SW Contact  Shelbie Hutching, RN Phone Number: 12/10/2020, 1:22 PM  Clinical Narrative:    Presented bed offers to patient's daughter, she wants to talk with her husband before making a decision and asks if [facility] can offer.  RNCM has reached out to admissions to see if they can offer a bed.     Expected Discharge Plan: Skilled Nursing Facility Barriers to Discharge: Family Issues (home is unsafe currently)  Expected Discharge Plan and Services Expected Discharge Plan: [place]   Discharge Planning Services: CM Consult Post Acute Care Choice: [place] arrangements for the past 2 months: Single Family Home                 DME Arranged: N/A DME Agency: NA         HH Agency: [place]         Social Determinants of Health (SDOH) Interventions    Readmission Risk Interventions No flowsheet data found.

## 2020-12-10 NOTE — ED Notes (Signed)
Pt escorted to restroom with wheelchair. Pt is strong 2 assist. Pt had a BM. NAD noted. Pt given blue scrub pants and purewick placed for comfort. Bed chux changed. Breakfast tray given to pt.

## 2020-12-10 NOTE — TOC Progression Note (Signed)
Transition of Care [facility]) - Progression Note    Patient Details  Name: [name] MRN: [id number] Date of Birth: 1945-03-15  Transition of Care [facility]) CM/SW Contact  Shelbie Hutching, RN Phone Number: 12/10/2020, 9:03 AM  Clinical Narrative:    RNCM has reached out to [facility] and [facility] to have them review the referral for rehab.     Expected Discharge Plan: Skilled Nursing Facility Barriers to Discharge: Family Issues (home is unsafe currently)  Expected Discharge Plan and Services Expected Discharge Plan: [place]   Discharge Planning Services: CM Consult Post Acute Care Choice: [place] arrangements for the past 2 months: Single Family Home                 DME Arranged: N/A DME Agency: NA         HH Agency: [place]         Social Determinants of Health (SDOH) Interventions    Readmission Risk Interventions No flowsheet data found.

## 2020-12-10 NOTE — Progress Notes (Signed)
Physical Therapy Treatment Patient Details Name: [name] MRN: [id number] DOB: 09-02-1945 Today's Date: 12/10/2020   History of Present Illness Pt is a 75 y/o F with PMH: DM, CAD, HTN, and dementia who presents due to urinary frequency and for wound check.  Per EMS, the patient's daughter notes that the patient has been on several courses of antibiotics over the last several weeks for UTI.    PT Comments    Pt agrees to session.  To EOB with good effort but needs multiple tried and eventually min a x 1 to get fully upright.  Steady in sitting.  She attempts to stand x 3 but is unable and seems generally unorganized in attempts.  Tries to pull from walker despite cues and education and is not able to begin to clear hips off bed.  She does remain upright for an 15 minutes while doing seated HEP with mod verbal cues to continue with task.  Mod a to return to supine and reposition in bed.  Pt stated she sleeps in recliner at home and bed is causing general discomfort.  She stated she only has an aide for a few hours a day (which differs from previous documentation so unsure which is accurate).  She is unrealistic in her ability to return home and care for herself "I do what I need to do" stating she just wants to return home today.  SNF remains appropriate.   Recommendations for follow up therapy are one component of a multi-disciplinary discharge planning process, led by the attending physician.  Recommendations may be updated based on patient status, additional functional criteria and insurance authorization.  Follow Up Recommendations  Skilled nursing-short term rehab (<3 hours/day)     Assistance Recommended at Discharge    [facility] bed    Recommendations for Other Services       Precautions / Restrictions Precautions Precautions: Fall     Mobility  Bed Mobility Overal bed mobility: Needs Assistance Bed Mobility: Supine to Sit;Sit to Supine      Supine to sit: Min assist Sit to supine: Mod assist        Transfers Overall transfer level: Needs assistance Equipment used: Rolling walker (2 wheels) Transfers: Sit to/from Stand Sit to Stand: Total assist;From elevated surface           General transfer comment: unable to stand today    Ambulation/Gait               General Gait Details: unable to stand   Stairs             Wheelchair Mobility    Modified Rankin (Stroke Patients Only)       Balance Overall balance assessment: Needs assistance Sitting-balance support: Feet unsupported;Bilateral upper extremity supported Sitting balance-Leahy Scale: Fair                                      Cognition Arousal/Alertness: Awake/alert Behavior During Therapy: WFL for tasks assessed/performed Overall Cognitive Status: No family/caregiver present to determine baseline cognitive functioning                                 General Comments: stated she only has aide service for a brief time during the day which is different from previous notes.  unsure of accuracy of assist level  at home.        Exercises Other Exercises Other Exercises: seated AROM 2 x 10 with verbal cues to continue task.  will stop after a rep or two if not cued    General Comments        Pertinent Vitals/Pain Pain Assessment: Faces Faces Pain Scale: Hurts even more Pain Location: general body aches from immobility and sleeping in a bed she is not used to. Pain Descriptors / Indicators: Sore;Aching;Grimacing Pain Intervention(s): Limited activity within patient's tolerance;Monitored during session;Repositioned    Home Living                          Prior Function            PT Goals (current goals can now be found in the care plan section) Progress towards PT goals: Not progressing toward goals - comment    Frequency    Min 2X/week      PT Plan Current plan remains  appropriate    Co-evaluation              AM-PAC PT "6 Clicks" Mobility   Outcome Measure  Help needed turning from your back to your side while in a flat bed without using bedrails?: A Little Help needed moving from lying on your back to sitting on the side of a flat bed without using bedrails?: A Little Help needed moving to and from a bed to a chair (including a wheelchair)?: Total Help needed standing up from a chair using your arms (e.g., wheelchair or bedside chair)?: Total Help needed to walk in hospital room?: Total Help needed climbing 3-5 steps with a railing? : Total 6 Click Score: 10    End of Session Equipment Utilized During Treatment: Gait belt Activity Tolerance: Patient tolerated treatment well Patient left: in bed;with call bell/phone within reach;with bed alarm set Nurse Communication: Mobility status PT Visit Diagnosis: Unsteadiness on feet (R26.81);Muscle weakness (generalized) (M62.81);Difficulty in walking, not elsewhere classified (R26.2)     Time: 2122-4825 PT Time Calculation (min) (ACUTE ONLY): 16 min  Charges:  $Therapeutic Exercise: 8-22 mins                    Chesley Noon, PTA 12/10/20, 10:46 AM

## 2020-12-10 NOTE — ED Notes (Signed)
Pt resting in bed. NAD noted.

## 2020-12-10 NOTE — ED Notes (Signed)
Pt stated that she wanted to get up and get on the blue couch while staring at the blue linen bag. Pt not oriented x3

## 2020-12-10 NOTE — TOC Progression Note (Signed)
Transition of Care [facility]) - Progression Note    Patient Details  Name: [name] MRN: [id number] Date of Birth: 10/23/1945  Transition of Care [facility]) CM/SW Contact  Shelbie Hutching, RN Phone Number: 12/10/2020, 9:57 AM  Clinical Narrative:    [facility] has declined to offer a bed, no reason given.  Bed search extended into [place].     Expected Discharge Plan: Skilled Nursing Facility Barriers to Discharge: Family Issues (home is unsafe currently)  Expected Discharge Plan and Services Expected Discharge Plan: [place]   Discharge Planning Services: CM Consult Post Acute Care Choice: [place] arrangements for the past 2 months: Single Family Home                 DME Arranged: N/A DME Agency: NA         HH Agency: [place]         Social Determinants of Health (SDOH) Interventions    Readmission Risk Interventions No flowsheet data found.

## 2020-12-10 NOTE — ED Notes (Signed)
Pt resting in bed. Respirations even and unlabored, NAD noted

## 2020-12-10 NOTE — TOC Progression Note (Signed)
Transition of Care [facility]) - Progression Note    Patient Details  Name: [name] MRN: [id number] Date of Birth: 1945/10/11  Transition of Care [facility]) CM/SW Contact  Shelbie Hutching, RN Phone Number: 12/10/2020, 3:58 PM  Clinical Narrative:    Daughter driving out to [place] to visit the rehab.  If she doesn't like it she will revisit the idea of [facility].     Expected Discharge Plan: Skilled Nursing Facility Barriers to Discharge: Family Issues (home is unsafe currently)  Expected Discharge Plan and Services Expected Discharge Plan: [place]   Discharge Planning Services: CM Consult Post Acute Care Choice: [place] arrangements for the past 2 months: Single Family Home                 DME Arranged: N/A DME Agency: NA         HH Agency: [place]         Social Determinants of Health (SDOH) Interventions    Readmission Risk Interventions No flowsheet data found.

## 2020-12-11 ENCOUNTER — Emergency Department: Payer: Medicare Other

## 2020-12-11 LAB — COMPREHENSIVE METABOLIC PANEL
ALT: 6 U/L (ref 0–44)
AST: 12 U/L — ABNORMAL LOW (ref 15–41)
Albumin: 2.4 g/dL — ABNORMAL LOW (ref 3.5–5.0)
Alkaline Phosphatase: 64 U/L (ref 38–126)
Anion gap: 9 (ref 5–15)
BUN: 18 mg/dL (ref 8–23)
CO2: 26 mmol/L (ref 22–32)
Calcium: 9 mg/dL (ref 8.9–10.3)
Chloride: 100 mmol/L (ref 98–111)
Creatinine, Ser: 1.38 mg/dL — ABNORMAL HIGH (ref 0.44–1.00)
GFR, Estimated: 40 mL/min — ABNORMAL LOW (ref >60)
Glucose, Bld: 171 mg/dL — ABNORMAL HIGH (ref 70–99)
Potassium: 3.2 mmol/L — ABNORMAL LOW (ref 3.5–5.1)
Sodium: 135 mmol/L (ref 135–145)
Total Bilirubin: 0.9 mg/dL (ref 0.3–1.2)
Total Protein: 7.6 g/dL (ref 6.5–8.1)

## 2020-12-11 LAB — PROTIME-INR
INR: 1.1 (ref 0.8–1.2)
Prothrombin Time: 14.2 seconds (ref 11.4–15.2)

## 2020-12-11 LAB — LACTIC ACID, PLASMA: Lactic Acid, Venous: 1 mmol/L (ref 0.5–1.9)

## 2020-12-11 LAB — CBC WITH DIFFERENTIAL/PLATELET
Abs Immature Granulocytes: 0.06 10*3/uL (ref 0.00–0.07)
Basophils Absolute: 0 10*3/uL (ref 0.0–0.1)
Basophils Relative: 0 %
Eosinophils Absolute: 0 10*3/uL (ref 0.0–0.5)
Eosinophils Relative: 0 %
HCT: 30.3 % — ABNORMAL LOW (ref 36.0–46.0)
Hemoglobin: 9.6 g/dL — ABNORMAL LOW (ref 12.0–15.0)
Immature Granulocytes: 1 %
Lymphocytes Relative: 11 %
Lymphs Abs: 1 10*3/uL (ref 0.7–4.0)
MCH: 28 pg (ref 26.0–34.0)
MCHC: 31.7 g/dL (ref 30.0–36.0)
MCV: 88.3 fL (ref 80.0–100.0)
Monocytes Absolute: 0.7 10*3/uL (ref 0.1–1.0)
Monocytes Relative: 8 %
Neutro Abs: 7.7 10*3/uL (ref 1.7–7.7)
Neutrophils Relative %: 80 %
Platelets: 275 10*3/uL (ref 150–400)
RBC: 3.43 MIL/uL — ABNORMAL LOW (ref 3.87–5.11)
RDW: 14 % (ref 11.5–15.5)
WBC: 9.5 10*3/uL (ref 4.0–10.5)
nRBC: 0 % (ref 0.0–0.2)

## 2020-12-11 LAB — APTT: aPTT: 25 seconds (ref 24–36)

## 2020-12-11 MED ORDER — ACETAMINOPHEN 325 MG PO TABS
650.0000 mg | ORAL_TABLET | Freq: Once | ORAL | Status: AC
  Administered 2020-12-11: 650 mg via ORAL

## 2020-12-11 NOTE — ED Notes (Signed)
Pt  Repositioned for comfort

## 2020-12-11 NOTE — ED Provider Notes (Signed)
-----------------------------------------   5:32 AM on 12/11/2020 -----------------------------------------   Blood pressure 137/69, pulse 84, temperature 97.8 F (36.6 C), temperature source Oral, resp. rate 18, height 5' (1.524 m), weight 103 kg, SpO2 96 %.  The patient is calm and cooperative at this time.  There have been no acute events since the last update.  Awaiting disposition plan from social worker.   Paulette Blanch, MD 12/11/20 [phone]

## 2020-12-11 NOTE — TOC Progression Note (Signed)
Transition of Care [facility]) - Progression Note    Patient Details  Name: [name] MRN: [id number] Date of Birth: 03-15-1945  Transition of Care [facility]) CM/SW [place], [place] Phone Number: 12/11/2020, 10:09 AM  Clinical Narrative:     CSW followed up with patient's daughter Tennyson regarding SNF choice. She reports she visited [place] and would like to proceed with [facility].   CSW has started [facility] for patient to dc to [facility], pending insurance auth at this time.   Expected Discharge Plan: Skilled Nursing Facility Barriers to Discharge: Family Issues (home is unsafe currently)  Expected Discharge Plan and Services Expected Discharge Plan: [place]   Discharge Planning Services: CM Consult Post Acute Care Choice: [place] arrangements for the past 2 months: Single Family Home                 DME Arranged: N/A DME Agency: NA         HH Agency: [place]         Social Determinants of Health (SDOH) Interventions    Readmission Risk Interventions No flowsheet data found.

## 2020-12-11 NOTE — ED Notes (Signed)
Pt's daughter and pt. Updated on POC and discussed pt's condition. Pt. Repositioned for comfort.

## 2020-12-11 NOTE — ED Notes (Signed)
Pt stating "leave me alone." Pt refusing to take her medication, eat, or drink.

## 2020-12-12 DIAGNOSIS — I5022 Chronic systolic (congestive) heart failure: Secondary | ICD-10-CM | POA: Diagnosis not present

## 2020-12-12 DIAGNOSIS — N390 Urinary tract infection, site not specified: Secondary | ICD-10-CM | POA: Diagnosis not present

## 2020-12-12 DIAGNOSIS — R0902 Hypoxemia: Secondary | ICD-10-CM | POA: Diagnosis not present

## 2020-12-12 DIAGNOSIS — Z7189 Other specified counseling: Secondary | ICD-10-CM | POA: Diagnosis not present

## 2020-12-12 DIAGNOSIS — Z7401 Bed confinement status: Secondary | ICD-10-CM | POA: Diagnosis not present

## 2020-12-12 DIAGNOSIS — I10 Essential (primary) hypertension: Secondary | ICD-10-CM | POA: Diagnosis not present

## 2020-12-12 DIAGNOSIS — N10 Acute tubulo-interstitial nephritis: Secondary | ICD-10-CM | POA: Diagnosis not present

## 2020-12-12 DIAGNOSIS — N3000 Acute cystitis without hematuria: Secondary | ICD-10-CM | POA: Diagnosis not present

## 2020-12-12 DIAGNOSIS — J4520 Mild intermittent asthma, uncomplicated: Secondary | ICD-10-CM | POA: Diagnosis not present

## 2020-12-12 DIAGNOSIS — N12 Tubulo-interstitial nephritis, not specified as acute or chronic: Secondary | ICD-10-CM | POA: Diagnosis not present

## 2020-12-12 DIAGNOSIS — Z515 Encounter for palliative care: Secondary | ICD-10-CM | POA: Diagnosis not present

## 2020-12-12 DIAGNOSIS — E876 Hypokalemia: Secondary | ICD-10-CM | POA: Diagnosis present

## 2020-12-12 DIAGNOSIS — J45909 Unspecified asthma, uncomplicated: Secondary | ICD-10-CM | POA: Diagnosis not present

## 2020-12-12 DIAGNOSIS — F32A Depression, unspecified: Secondary | ICD-10-CM | POA: Diagnosis not present

## 2020-12-12 DIAGNOSIS — Z66 Do not resuscitate: Secondary | ICD-10-CM | POA: Diagnosis present

## 2020-12-12 DIAGNOSIS — I5032 Chronic diastolic (congestive) heart failure: Secondary | ICD-10-CM | POA: Diagnosis present

## 2020-12-12 DIAGNOSIS — I5030 Unspecified diastolic (congestive) heart failure: Secondary | ICD-10-CM | POA: Diagnosis not present

## 2020-12-12 DIAGNOSIS — L304 Erythema intertrigo: Secondary | ICD-10-CM | POA: Diagnosis not present

## 2020-12-12 DIAGNOSIS — E11319 Type 2 diabetes mellitus with unspecified diabetic retinopathy without macular edema: Secondary | ICD-10-CM | POA: Diagnosis present

## 2020-12-12 DIAGNOSIS — B379 Candidiasis, unspecified: Secondary | ICD-10-CM | POA: Diagnosis present

## 2020-12-12 DIAGNOSIS — I130 Hypertensive heart and chronic kidney disease with heart failure and stage 1 through stage 4 chronic kidney disease, or unspecified chronic kidney disease: Secondary | ICD-10-CM | POA: Diagnosis present

## 2020-12-12 DIAGNOSIS — F0390 Unspecified dementia without behavioral disturbance: Secondary | ICD-10-CM | POA: Diagnosis present

## 2020-12-12 DIAGNOSIS — N184 Chronic kidney disease, stage 4 (severe): Secondary | ICD-10-CM | POA: Diagnosis not present

## 2020-12-12 DIAGNOSIS — E119 Type 2 diabetes mellitus without complications: Secondary | ICD-10-CM | POA: Diagnosis not present

## 2020-12-12 DIAGNOSIS — I2510 Atherosclerotic heart disease of native coronary artery without angina pectoris: Secondary | ICD-10-CM

## 2020-12-12 DIAGNOSIS — I428 Other cardiomyopathies: Secondary | ICD-10-CM | POA: Diagnosis present

## 2020-12-12 DIAGNOSIS — I350 Nonrheumatic aortic (valve) stenosis: Secondary | ICD-10-CM | POA: Diagnosis present

## 2020-12-12 DIAGNOSIS — Z741 Need for assistance with personal care: Secondary | ICD-10-CM | POA: Diagnosis not present

## 2020-12-12 DIAGNOSIS — Z8249 Family history of ischemic heart disease and other diseases of the circulatory system: Secondary | ICD-10-CM | POA: Diagnosis not present

## 2020-12-12 DIAGNOSIS — Z736 Limitation of activities due to disability: Secondary | ICD-10-CM | POA: Diagnosis not present

## 2020-12-12 DIAGNOSIS — E569 Vitamin deficiency, unspecified: Secondary | ICD-10-CM | POA: Diagnosis not present

## 2020-12-12 DIAGNOSIS — N179 Acute kidney failure, unspecified: Secondary | ICD-10-CM | POA: Diagnosis not present

## 2020-12-12 DIAGNOSIS — Z820 Family history of epilepsy and other diseases of the nervous system: Secondary | ICD-10-CM | POA: Diagnosis not present

## 2020-12-12 DIAGNOSIS — Z634 Disappearance and death of family member: Secondary | ICD-10-CM | POA: Diagnosis not present

## 2020-12-12 DIAGNOSIS — Z833 Family history of diabetes mellitus: Secondary | ICD-10-CM | POA: Diagnosis not present

## 2020-12-12 DIAGNOSIS — E782 Mixed hyperlipidemia: Secondary | ICD-10-CM | POA: Diagnosis not present

## 2020-12-12 DIAGNOSIS — R609 Edema, unspecified: Secondary | ICD-10-CM | POA: Diagnosis not present

## 2020-12-12 DIAGNOSIS — N2889 Other specified disorders of kidney and ureter: Secondary | ICD-10-CM | POA: Diagnosis not present

## 2020-12-12 DIAGNOSIS — R451 Restlessness and agitation: Secondary | ICD-10-CM | POA: Diagnosis not present

## 2020-12-12 DIAGNOSIS — M6259 Muscle wasting and atrophy, not elsewhere classified, multiple sites: Secondary | ICD-10-CM | POA: Diagnosis not present

## 2020-12-12 DIAGNOSIS — R350 Frequency of micturition: Secondary | ICD-10-CM | POA: Diagnosis present

## 2020-12-12 DIAGNOSIS — R52 Pain, unspecified: Secondary | ICD-10-CM | POA: Diagnosis not present

## 2020-12-12 DIAGNOSIS — D519 Vitamin B12 deficiency anemia, unspecified: Secondary | ICD-10-CM | POA: Diagnosis not present

## 2020-12-12 DIAGNOSIS — R262 Difficulty in walking, not elsewhere classified: Secondary | ICD-10-CM | POA: Diagnosis not present

## 2020-12-12 DIAGNOSIS — I509 Heart failure, unspecified: Secondary | ICD-10-CM | POA: Diagnosis not present

## 2020-12-12 DIAGNOSIS — I272 Pulmonary hypertension, unspecified: Secondary | ICD-10-CM | POA: Diagnosis present

## 2020-12-12 DIAGNOSIS — E1122 Type 2 diabetes mellitus with diabetic chronic kidney disease: Secondary | ICD-10-CM | POA: Diagnosis not present

## 2020-12-12 DIAGNOSIS — Z20822 Contact with and (suspected) exposure to covid-19: Secondary | ICD-10-CM | POA: Diagnosis present

## 2020-12-12 DIAGNOSIS — E785 Hyperlipidemia, unspecified: Secondary | ICD-10-CM | POA: Diagnosis not present

## 2020-12-12 LAB — URINALYSIS, COMPLETE (UACMP) WITH MICROSCOPIC
Bilirubin Urine: NEGATIVE
Glucose, UA: 50 mg/dL — AB
Ketones, ur: 20 mg/dL — AB
Nitrite: NEGATIVE
Protein, ur: >=300 mg/dL — AB
RBC / HPF: >50 RBC/hpf — ABNORMAL HIGH (ref 0–5)
Specific Gravity, Urine: 1.018 (ref 1.005–1.030)
WBC, UA: >50 WBC/hpf — ABNORMAL HIGH (ref 0–5)
pH: 5 (ref 5.0–8.0)

## 2020-12-12 LAB — RESP PANEL BY RT-PCR (FLU A&B, COVID) ARPGX2
Influenza A by PCR: NEGATIVE
Influenza B by PCR: NEGATIVE
SARS Coronavirus 2 by RT PCR: NEGATIVE

## 2020-12-12 MED ORDER — SODIUM CHLORIDE 0.9 % IV BOLUS
500.0000 mL | Freq: Once | INTRAVENOUS | Status: AC
  Administered 2020-12-12: 500 mL via INTRAVENOUS

## 2020-12-12 MED ORDER — CEPHALEXIN 500 MG PO CAPS
500.0000 mg | ORAL_CAPSULE | Freq: Two times a day (BID) | ORAL | Status: DC
  Administered 2020-12-12 – 2020-12-15 (×6): 500 mg via ORAL
  Filled 2020-12-12 (×6): qty 1

## 2020-12-12 MED ORDER — SODIUM CHLORIDE 0.9 % IV SOLN
1.0000 g | Freq: Once | INTRAVENOUS | Status: AC
  Administered 2020-12-12: 1 g via INTRAVENOUS
  Filled 2020-12-12: qty 10

## 2020-12-12 MED ORDER — ACETAMINOPHEN 500 MG PO TABS
1000.0000 mg | ORAL_TABLET | Freq: Four times a day (QID) | ORAL | Status: DC | PRN
  Administered 2020-12-12 – 2020-12-13 (×3): 1000 mg via ORAL
  Filled 2020-12-12 (×3): qty 2

## 2020-12-12 NOTE — ED Notes (Signed)
Family at bedside pt eating muffin and having some tea.

## 2020-12-12 NOTE — ED Notes (Signed)
Spoke with pt's daughter Kaia and updated on pt's care and status.

## 2020-12-12 NOTE — ED Notes (Signed)
MD notified re: bladder scan and 267 ml urinary volume.

## 2020-12-12 NOTE — ED Notes (Signed)
Pt checked and still dry. Pt has not had any output since this am when pt was in and out cathed. Bladder scan performed and revealed >280. Will inform MD.

## 2020-12-12 NOTE — Progress Notes (Signed)
CSW contacted Tammy at [facility] to follow-up with insurance authorization being approved through [place] and when patient can discharge. CSW left an voicemail.

## 2020-12-12 NOTE — ED Provider Notes (Signed)
Patient does have developed a fever patient has developed a fever, with tachycardia, her urinalysis remains consistent with UTI, will start IV Rocephin, at this point the patient will require admission   Jillian Drafts, MD 12/12/20 [phone]

## 2020-12-12 NOTE — ED Provider Notes (Signed)
Today's Vitals   12/11/20 2300 12/11/20 2330 12/12/20 0000 12/12/20 0030  BP: (!) 106/58 129/62 135/65 (!) 128/54  Pulse: 76 78 82 (!) 54  Resp:      Temp:      TempSrc:      SpO2: 94% 96% 95% 100%  Weight:      Height:      PainSc:       Body mass index is 44.33 kg/m.   Patient resting comfortably.  No acute events overnight other than patient developing a fever of 103 but otherwise remained hemodynamically stable.  She has no acute complaints this morning other than feeling like her bed is uncomfortable.  She denies any pain.  She is alert and oriented to person only which seems likely to be her baseline.  She has history of dementia.  Urine 3 days ago appeared infected.  Culture grew multiple species.  Repeat lab work was done last night and was reassuring.  No leukocytosis or leukopenia.  Normal lactic.  Will recollect urine sample by In-N-Out catheter.  Will start Keflex.  Will reswab her for COVID and influenza although these were -3 days ago.  She is awaiting social work disposition for placement.   Sherrill Mck[place]ie, Delice Bison, DO 12/12/20 [phone]

## 2020-12-12 NOTE — ED Notes (Signed)
In and out cath performed. Pt had thick white substance come from cath. Different cath placed and able to drain urine in dark coloration. About 300 ML emptied. Pt cleaned up and had large BM. Rectal temp checked. Clean brief, chuxs and external cath placed. Pt pulled up in bed.  Deodorant placed on pt and barrier cream under breast.

## 2020-12-12 NOTE — H&P (Signed)
History and Physical  [name] XBW:[id number] DOB: September 03, 1945 DOA: 12/08/2020  Referring physician: Dr. Lavonia Drafts PCP: Birdie Sons, MD  Outpatient Specialists:  Patient coming from: Home & is  not able to ambulate   Chief Complaint: Fever  HPI: [name] is a 75 y.o. female with medical history significant for arctic stenosis, asthma, coronary disease status post cardiac catheterization June March 07, 2016 diabetes mellitus, hypertension, nonischemic cardiomyopathy, who originally presented to the emergency department for frequency of urination and wound check on sixth December 08, 2020 for strong urine order with history of recurrent UTI and status post triple antibiotic treatment over the past several weeks.  Noted over the last several days she has been eating less than yesterday and admitted to have increased frequency of urination and has been somewhat agitated with a baseline dementia .Marland Kitchen  Patient was also noted to be living by herself after her husband died and her daughter brought her in to live with her but has been having difficulty managing her because patient was no longer able to ambulate and was not eating and was difficult to manage overall.  Patient was therefore requesting for SNF transfer but while she was waiting for SNF she developed fever again of 103 UA was positive and is therefore being requested for admission due to the fact that she has had multiple outpatient antibiotic treatment with failures.   ED Course: Patient had been waiting in the emergency department for over 100 hours for transfer to SNF she was then noted to become tachycardic and with a fever temperature was as high as 103.  She had been on Cipro at home which was changed to Keflex while she was in the ED but she failed oral therapy She was started on ceftriaxone IV  Review of Systems: Patient could not really give me any history and review of systems she was saying no to everything     Past  Medical History:  Diagnosis Date   Aortic stenosis    Asthma    Coronary artery disease    Diabetes mellitus without complication ([place])    History of measles    Hypertension    Nonischemic cardiomyopathy ([place])    Pulmonary hypertension ([place])    Past Surgical History:  Procedure Laterality Date   CARDIAC CATHETERIZATION     Cyst(solitary) of breast:removed     RIGHT/LEFT HEART CATH AND CORONARY ANGIOGRAPHY N/A 03/07/2016   Procedure: Right/Left Heart Cath and Coronary Angiography;  Surgeon: Nelva Bush, MD;  Location: [facility];  Service: Cardiovascular;  Laterality: N/A;   TUBAL LIGATION      Social History:  reports that she has never smoked. She has never used smokeless tobacco. She reports that she does not drink alcohol and does not use drugs.   Allergies  Allergen Reactions   Hydrochlorothiazide Other (See Comments)    Leg Cramps   Penicillins Rash    Family History  Problem Relation Age of Onset   Cancer Mother        Uterine cancer   Diabetes Mother    Heart disease Mother    Heart failure Mother    Parkinson's disease Father    Cancer Sister        breast cancer   Hypertension Sister    Diabetes Sister        Non-insulin dependent Diabetes Mellitus   Diabetes Brother        Non-insulin Dependent Diabetes Mellitus   Cancer  Brother    Mesothelioma Brother       Prior to Admission medications   Medication Sig Start Date End Date Taking? Authorizing Provider  albuterol (VENTOLIN HFA) 108 (90 Base) MCG/ACT inhaler INHALE 1-2 PUFFS INTO THE LUNGS EVERY 6 (SIX) HOURS AS NEEDED FOR SHORTNESS OF BREATH. 04/23/20  Yes Birdie Sons, MD  amLODipine (NORVASC) 5 MG tablet Take 5 mg by mouth daily.   Yes [provider]  Cholecalciferol (VITAMIN D) 50 MCG (2000 UT) tablet Take 2,000 Units by mouth daily.   Yes [provider]  cyanocobalamin 1000 MCG tablet Take 1,000 mcg by mouth daily.   Yes [provider]  donepezil  (ARICEPT) 5 MG tablet Take 1 tablet by mouth at bedtime. 11/04/20 11/04/21 Yes [provider]  furosemide (LASIX) 20 MG tablet Take 1 tablet (20 mg total) by mouth daily as needed (swelling). 11/17/20  Yes End, Harrell Gave, MD  HYDROcodone-acetaminophen (NORCO) 10-325 MG tablet Take 1 tablet by mouth every 6 (six) hours as needed.   Yes [provider]  levalbuterol (XOPENEX) 1.25 MG/3ML nebulizer solution USE 1 VIAL IN NEBULIZER EVERY 8 HOURS AS NEEDED FOR WHEEZING 05/14/20  Yes Fisher, Kirstie Peri, MD  lisinopril (ZESTRIL) 10 MG tablet TAKE 1 TABLET BY MOUTH EVERY DAY--DOSE INCREASE 08/31/20  Yes End, Harrell Gave, MD  metoprolol tartrate (LOPRESSOR) 50 MG tablet Take 1.5 tablets (75 mg total) by mouth 2 (two) times daily. 11/17/20  Yes End, Harrell Gave, MD  nystatin (MYCOSTATIN/NYSTOP) powder Apply 1 application topically 3 (three) times daily. Apply to areas under the breast 12/08/20  Yes Rada Hay, MD  oxybutynin (DITROPAN) 5 MG tablet Take 1 tablet (5 mg total) by mouth 2 (two) times daily. 07/14/20  Yes McGowan, Larene Beach A, PA-C  QUEtiapine (SEROQUEL) 25 MG tablet Take 1 tablet (25 mg total) by mouth at bedtime. 11/23/20  Yes Birdie Sons, MD  traZODone (DESYREL) 100 MG tablet Take 100 mg by mouth at bedtime as needed.   Yes [provider]    Physical Exam: BP (!) 152/82   Pulse (!) 105   Temp (!) 100.7 F (38.2 C) (Oral)   Resp (!) 38   Ht 5' (1.524 m)   Wt 103 kg   SpO2 96%   BMI 44.33 kg/m   Exam:  General: 75 y.o. year-old female well developed well nourished in no acute distress.  Lying quietly in bed, patient was drowsy during no to most of the questions Cardiovascular: Regular rate and rhythm with no rubs or gallops.  No thyromegaly or JVD noted.   Respiratory: Clear to auscultation with no wheezes or rales. Good inspiratory effort. Abdomen: Soft nontender nondistended with normal bowel sounds x4 quadrants. Musculoskeletal: No lower extremity  edema. 2/4 pulses in all 4 extremities. Skin: No ulcerative lesions noted or rashes, Psychiatry: Mood is appropriate for condition and setting patient stated that she did not know when I asked her what her name was she was answering mostly no to all the questions           Labs on Admission:  Basic Metabolic Panel: Recent Labs  Lab 12/08/20 0904 12/11/20 2203  NA 138 135  K 3.2* 3.2*  CL 105 100  CO2 29 26  GLUCOSE 118* 171*  BUN 23 18  CREATININE 1.45* 1.38*  CALCIUM 9.1 9.0   Liver Function Tests: Recent Labs  Lab 12/08/20 0904 12/11/20 2203  AST 14* 12*  ALT 7 6  ALKPHOS 68 64  BILITOT 0.5 0.9  PROT 8.2* 7.6  ALBUMIN 2.9* 2.4*   No results for input(s): LIPASE, AMYLASE in the last 168 hours. No results for input(s): AMMONIA in the last 168 hours. CBC: Recent Labs  Lab 12/08/20 0904 12/11/20 2203  WBC 6.7 9.5  NEUTROABS 3.6 7.7  HGB 9.8* 9.6*  HCT 32.2* 30.3*  MCV 91.5 88.3  PLT 303 275   Cardiac Enzymes: No results for input(s): CKTOTAL, CKMB, CKMBINDEX, TROPONINI in the last 168 hours.  BNP (last 3 results) No results for input(s): BNP in the last 8760 hours.  ProBNP (last 3 results) No results for input(s): PROBNP in the last 8760 hours.  CBG: No results for input(s): GLUCAP in the last 168 hours.  Radiological Exams on Admission: DG Chest Port 1 View  Result Date: 12/11/2020 CLINICAL DATA:  Questionable sepsis EXAM: PORTABLE CHEST 1 VIEW COMPARISON:  07/14/2015 FINDINGS: Heart and mediastinal contours are within normal limits. No focal opacities or effusions. No acute bony abnormality. IMPRESSION: No active disease. Electronically Signed   By: Rolm Baptise M.D.   On: 12/11/2020 21:40    EKG: Independently reviewed.  Sinus tachycardia borderline prolonged QT  Assessment/Plan Present on Admission:  Intermittent asthma, uncontrolled  Type 2 diabetes mellitus with stage 4 chronic kidney disease (HCC)  Chronic edema  Essential hypertension   Hyperlipidemia  CAD in native artery  UTI (urinary tract infection)  Active Problems:   Intermittent asthma, uncontrolled   Type 2 diabetes mellitus with stage 4 chronic kidney disease (HCC)   Chronic edema   Essential hypertension   Hyperlipidemia   Congestive heart failure (HCC)   CAD in native artery   UTI (urinary tract infection)  UTI possible urosepsis patient is having fever it was as high as 103 and she is tachycardic. Patient failed outpatient oral antibiotics patient is being admitted for IV ceftriaxone Patient will be receiving IV antibiotics and will monitor  Candidiasis.  Her UA was also positive for yeast we will start her on Diflucan  Dementia.  Patient is waiting for SNF continue Aricept  Hypertension.  Patient is mildly elevated blood pressure we will continue home antihypertensives  Coronary artery disease: Patient has recent cardiac catheterization in 2018.   Patient does not have any symptoms, EKG showed only sinus tachycardia no STEMI  Hypokalemia of 3.2 we will replace We will recheck in the morning  Sinus tachycardia normal due to infection.  Patient is currently on metoprolol  Urinary incontinence. Patient is on oxybutynin I will hold that due to her tachycardia  Severity of Illness: The appropriate patient status for this patient is INPATIENT. Inpatient status is judged to be reasonable and necessary in order to provide the required intensity of service to ensure the patient's safety. The patient's presenting symptoms, physical exam findings, and initial radiographic and laboratory data in the context of their chronic comorbidities is felt to place them at high risk for further clinical deterioration. Furthermore, it is not anticipated that the patient will be medically stable for discharge from the hospital within 2 midnights of admission.   Fever tachycardia UTI possible urosepsis requiring IV antibiotics failed oral antibiotics * I certify that at  the point of admission it is my clinical judgment that the patient will require inpatient hospital care spanning beyond 2 midnights from the point of admission due to high intensity of service, high risk for further deterioration and high frequency of surveillance required.*   DVT prophylaxis: Lovenox  Code Status: Full  Family  Communication: None  Disposition Plan: Patient was waiting for SNF prior to becoming febrile with a UTI  Consults called: None  Admission status: Inpatient    Cristal Deer MD [facility] Pager [phone]  If 7PM-7AM, please contact night-coverage [email or web address] Password [email or web address]  12/12/2020, 8:50 PM

## 2020-12-12 NOTE — TOC Progression Note (Signed)
Transition of Care [facility]) - Progression Note    Patient Details  Name: CHOUA IKNER MRN: [id number] Date of Birth: 25-May-1945  Transition of Care [facility]) CM/SW [place], [place] Phone Number: 12/12/2020, 8:49 AM  Clinical Narrative:   Candace Cruise still pending at this time.     Expected Discharge Plan: Skilled Nursing Facility Barriers to Discharge: Family Issues (home is unsafe currently)  Expected Discharge Plan and Services Expected Discharge Plan: [place]   Discharge Planning Services: CM Consult Post Acute Care Choice: [place] arrangements for the past 2 months: Single Family Home                 DME Arranged: N/A DME Agency: NA         HH Agency: [facility]         Social Determinants of Health (SDOH) Interventions    Readmission Risk Interventions No flowsheet data found.

## 2020-12-13 ENCOUNTER — Encounter: Payer: Self-pay | Admitting: Family Medicine

## 2020-12-13 ENCOUNTER — Inpatient Hospital Stay: Payer: Medicare Other

## 2020-12-13 DIAGNOSIS — Z515 Encounter for palliative care: Secondary | ICD-10-CM

## 2020-12-13 DIAGNOSIS — I5030 Unspecified diastolic (congestive) heart failure: Secondary | ICD-10-CM

## 2020-12-13 DIAGNOSIS — N12 Tubulo-interstitial nephritis, not specified as acute or chronic: Secondary | ICD-10-CM

## 2020-12-13 DIAGNOSIS — R262 Difficulty in walking, not elsewhere classified: Secondary | ICD-10-CM

## 2020-12-13 DIAGNOSIS — E782 Mixed hyperlipidemia: Secondary | ICD-10-CM

## 2020-12-13 DIAGNOSIS — N184 Chronic kidney disease, stage 4 (severe): Secondary | ICD-10-CM

## 2020-12-13 DIAGNOSIS — J4520 Mild intermittent asthma, uncomplicated: Secondary | ICD-10-CM

## 2020-12-13 DIAGNOSIS — Z7189 Other specified counseling: Secondary | ICD-10-CM

## 2020-12-13 DIAGNOSIS — N390 Urinary tract infection, site not specified: Secondary | ICD-10-CM

## 2020-12-13 DIAGNOSIS — E1122 Type 2 diabetes mellitus with diabetic chronic kidney disease: Secondary | ICD-10-CM

## 2020-12-13 LAB — CBC
HCT: 29.7 % — ABNORMAL LOW (ref 36.0–46.0)
Hemoglobin: 9.6 g/dL — ABNORMAL LOW (ref 12.0–15.0)
MCH: 28.3 pg (ref 26.0–34.0)
MCHC: 32.3 g/dL (ref 30.0–36.0)
MCV: 87.6 fL (ref 80.0–100.0)
Platelets: 296 10*3/uL (ref 150–400)
RBC: 3.39 MIL/uL — ABNORMAL LOW (ref 3.87–5.11)
RDW: 14.4 % (ref 11.5–15.5)
WBC: 14.1 10*3/uL — ABNORMAL HIGH (ref 4.0–10.5)
nRBC: 0 % (ref 0.0–0.2)

## 2020-12-13 LAB — BASIC METABOLIC PANEL
Anion gap: 8 (ref 5–15)
BUN: 35 mg/dL — ABNORMAL HIGH (ref 8–23)
CO2: 25 mmol/L (ref 22–32)
Calcium: 9 mg/dL (ref 8.9–10.3)
Chloride: 103 mmol/L (ref 98–111)
Creatinine, Ser: 2.13 mg/dL — ABNORMAL HIGH (ref 0.44–1.00)
GFR, Estimated: 24 mL/min — ABNORMAL LOW (ref >60)
Glucose, Bld: 125 mg/dL — ABNORMAL HIGH (ref 70–99)
Potassium: 3.3 mmol/L — ABNORMAL LOW (ref 3.5–5.1)
Sodium: 136 mmol/L (ref 135–145)

## 2020-12-13 LAB — URINE CULTURE: Culture: >=100000 — AB

## 2020-12-13 MED ORDER — ENOXAPARIN SODIUM 40 MG/0.4ML IJ SOSY
40.0000 mg | PREFILLED_SYRINGE | INTRAMUSCULAR | Status: DC
  Filled 2020-12-13: qty 0.4

## 2020-12-13 NOTE — Consult Note (Signed)
Consultation Note Date: 12/13/2020   Patient Name: [name]  DOB: Dec 07, 1945  MRN: [id number]  Age / Sex: 75 y.o., female  PCP: Jillian Sons, MD Referring Physician: Richarda Osmond, MD  Reason for Consultation: Establishing goals of care and Psychosocial/spiritual support  HPI/Patient Profile: 75 y.o. female  with past medical history of dementia, aortic stenosis, asthma, CAD s/p cardiac cath 2018-nonischemic cardiomyopathy, DM with CKD4, HTN, urinary incontinence with recurrent UTI sp triple antibiotic treatment over past several weeks, hemorrhoids admitted on 12/08/2020 with UTI with possible urosepsis/candidiasis.   Clinical Assessment and Goals of Care: I have reviewed medical records including EPIC notes, labs and imaging, received report from RN, assessed the patient.  [name] is lying quietly in bed.  She appears acutely/chronically ill and quite frail.  She is oriented to person, place, but not really situation.  I am not sure that she can make her basic needs known.  There is no family at bedside at this time.  Call to daughter, [name], to discuss diagnosis prognosis, GOC, EOL wishes, disposition and options.  I introduced Palliative Medicine as specialized medical care for people living with serious illness. It focuses on providing relief from the symptoms and stress of a serious illness. The goal is to improve quality of life for both the patient and the family.  We discussed a brief life review of the patient.  [name] shares that her father.about 2 months ago.  She shares that she moved in with her mother to reduce her mother stress.  She shares that she has been having recurrent UTIs, usually every 3 weeks.    We then focused on their current illness.  We talked about recurrent UTIs and colonization.  We talked about the likelihood that [name] will continue to  experience UTIs.  [name] shares that her mother had been incontinent of urine with difficulty with personal care.  The natural disease trajectory and expectations at EOL were discussed.  [facility] with the ultimate goal of returning to [name]'s own home.  Advanced directives, concepts specific to code status, were considered and discussed.  Advanced directives in ACP tab of epic names [name] as healthcare surrogate, desire for natural death.  Orders adjusted.  Hospice and Palliative Care services outpatient were explained and offered.  [name] shares that she had hospice care for her mother-in-law with [facility].  She is agreeable to outpatient palliative care with [facility], provider choice offered.  Discussed the importance of continued conversation with family and the medical providers regarding overall plan of care and treatment options, ensuring decisions are within the context of the patient's values and GOCs.  Questions and concerns were addressed.  The family was encouraged to call with questions or concerns.  PMT will continue to support holistically.  Conference with attending, bedside nursing staff, transition of care team, and house hospice representative related to patient condition, needs, goals of care, disposition.   HCPOA   HCPOA -daughter, [name].  [facility] POA paperwork and ACP tab of epic.    SUMMARY OF RECOMMENDATIONS   At this point continue to treat the treatable but no CPR or intubation [facility] for [facility] Return home with outpatient palliative services Transition to hospice when appropriate   Code Status/Advance Care Planning: DNR  Symptom Management:  Per hospitalist, no additional needs at this time.  Palliative Prophylaxis:  Oral Care and Turn Reposition  Additional Recommendations (Limitations, Scope, Preferences): All treat the treatable but no CPR or intubation.  Psycho-social/Spiritual:   Desire for further Chaplaincy support:no Additional Recommendations: Caregiving  Support/Resources and Education on Hospice  Prognosis:  Unable to determine, based on outcomes.  Guarded at this point.  3 to 6 months or less would not be surprising based on decreasing functional status, chronic illness burden, recurrent UTIs.  Discharge Planning:  At this point has [facility] outpatient palliative services.       Primary Diagnoses: Present on Admission:  Intermittent asthma, uncontrolled  Type 2 diabetes mellitus with stage 4 chronic kidney disease (HCC)  Chronic edema  Essential hypertension  Hyperlipidemia  CAD in native artery   I have reviewed the medical record, interviewed the patient and family, and examined the patient. The following aspects are pertinent.  Past Medical History:  Diagnosis Date   Aortic stenosis    Asthma    Coronary artery disease    Diabetes mellitus without complication ([place])    History of measles    Hypertension    Nonischemic cardiomyopathy ([place])    Pulmonary hypertension ([place])    Social History   Socioeconomic History   Marital status: Widowed    Spouse name: Not on file   Number of children: 2   Years of education: Not on file   Highest education level: High school graduate  Occupational History   Occupation: retired  Tobacco Use   Smoking status: Never   Smokeless tobacco: Never  Vaping Use   Vaping Use: Never used  Substance and Sexual Activity   Alcohol use: No   Drug use: No   Sexual activity: Yes    Birth control/protection: None  Other Topics Concern   Not on file  Social History Narrative   Not on file   Social Determinants of Health   Financial Resource Strain: Low Risk    Difficulty of Paying Living Expenses: Not hard at all  Food Insecurity: No Food Insecurity   Worried About Charity fundraiser in the Last Year: Never true   [place] in the Last Year: Never true  Transportation  Needs: No Transportation Needs   Lack of Transportation (Medical): No   Lack of Transportation (Non-Medical): No  Physical Activity: Inactive   Days of Exercise per Week: 0 days   Minutes of Exercise per Session: 0 min  Stress: No Stress Concern Present   Feeling of Stress : Only a little  Social Connections: Moderately Isolated   Frequency of Communication with Friends and Family: More than three times a week   Frequency of Social Gatherings with Friends and Family: Never   Attends Religious Services: Never   Marine scientist or Organizations: No   Attends Music therapist: Never   Marital Status: Married   Family History  Problem Relation Age of Onset   Cancer Mother        Uterine cancer   Diabetes Mother    Heart disease Mother    Heart failure Mother  Parkinson's disease Father    Cancer Sister        breast cancer   Hypertension Sister    Diabetes Sister        Non-insulin dependent Diabetes Mellitus   Diabetes Brother        Non-insulin Dependent Diabetes Mellitus   Cancer Brother    Mesothelioma Brother    Scheduled Meds:  amLODipine  5 mg Oral Daily   cephALEXin  500 mg Oral Q12H   cholecalciferol  2,000 Units Oral Daily   enoxaparin (LOVENOX) injection  40 mg Subcutaneous Q24H   lisinopril  10 mg Oral Daily   metoprolol tartrate  75 mg Oral BID   QUEtiapine  25 mg Oral QHS   cyanocobalamin  1,000 mcg Oral Daily   Continuous Infusions: PRN Meds:.acetaminophen, albuterol, furosemide, traZODone Medications Prior to Admission:  Prior to Admission medications   Medication Sig Start Date End Date Taking? Authorizing Provider  albuterol (VENTOLIN HFA) 108 (90 Base) MCG/ACT inhaler INHALE 1-2 PUFFS INTO THE LUNGS EVERY 6 (SIX) HOURS AS NEEDED FOR SHORTNESS OF BREATH. 04/23/20  Yes Jillian Sons, MD  amLODipine (NORVASC) 5 MG tablet Take 5 mg by mouth daily.   Yes [provider]  Cholecalciferol (VITAMIN D) 50 MCG (2000 UT) tablet  Take 2,000 Units by mouth daily.   Yes [provider]  cyanocobalamin 1000 MCG tablet Take 1,000 mcg by mouth daily.   Yes [provider]  donepezil (ARICEPT) 5 MG tablet Take 1 tablet by mouth at bedtime. 11/04/20 11/04/21 Yes [provider]  furosemide (LASIX) 20 MG tablet Take 1 tablet (20 mg total) by mouth daily as needed (swelling). 11/17/20  Yes End, Harrell Gave, MD  HYDROcodone-acetaminophen (NORCO) 10-325 MG tablet Take 1 tablet by mouth every 6 (six) hours as needed.   Yes [provider]  levalbuterol (XOPENEX) 1.25 MG/3ML nebulizer solution USE 1 VIAL IN NEBULIZER EVERY 8 HOURS AS NEEDED FOR WHEEZING 05/14/20  Yes Fisher, Kirstie Peri, MD  lisinopril (ZESTRIL) 10 MG tablet TAKE 1 TABLET BY MOUTH EVERY DAY--DOSE INCREASE 08/31/20  Yes End, Harrell Gave, MD  metoprolol tartrate (LOPRESSOR) 50 MG tablet Take 1.5 tablets (75 mg total) by mouth 2 (two) times daily. 11/17/20  Yes End, Harrell Gave, MD  nystatin (MYCOSTATIN/NYSTOP) powder Apply 1 application topically 3 (three) times daily. Apply to areas under the breast 12/08/20  Yes Rada Hay, MD  oxybutynin (DITROPAN) 5 MG tablet Take 1 tablet (5 mg total) by mouth 2 (two) times daily. 07/14/20  Yes McGowan, Larene Beach A, PA-C  QUEtiapine (SEROQUEL) 25 MG tablet Take 1 tablet (25 mg total) by mouth at bedtime. 11/23/20  Yes Jillian Sons, MD  traZODone (DESYREL) 100 MG tablet Take 100 mg by mouth at bedtime as needed.   Yes [provider]   Allergies  Allergen Reactions   Hydrochlorothiazide Other (See Comments)    Leg Cramps   Penicillins Rash   Review of Systems  Unable to perform ROS: Dementia   Physical Exam Vitals and nursing note reviewed.  Constitutional:      Appearance: She is obese.  HENT:     Head: Atraumatic.     Mouth/Throat:     Mouth: Mucous membranes are moist.  Cardiovascular:     Rate and Rhythm: Normal rate.  Pulmonary:     Effort: Pulmonary effort is normal.  No respiratory distress.  Skin:    General: Skin is warm and dry.  Neurological:     Mental Status:  She is alert.     Comments: Known dementia   Psychiatric:     Comments: Calm, not fearful    Vital Signs: BP (!) 149/68 (BP Location: Right Arm)   Pulse 95   Temp 98 F (36.7 C)   Resp 18   Ht 5' (1.524 m)   Wt 103 kg   SpO2 97%   BMI 44.33 kg/m  Pain Scale: 0-10   Pain Score: 3    SpO2: SpO2: 97 % O2 Device:SpO2: 97 % O2 Flow Rate: .   IO: Intake/output summary:  Intake/Output Summary (Last 24 hours) at 12/13/2020 1340 Last data filed at 12/13/2020 1033 Gross per 24 hour  Intake 1160 ml  Output 1 ml  Net 1159 ml    LBM: Last BM Date: 12/13/20 Baseline Weight: Weight: 103 kg Most recent weight: Weight: 103 kg     Palliative Assessment/Data:   Flowsheet Rows    Flowsheet Row Most Recent Value  Intake Tab   Referral Department Hospitalist  Unit at Time of Referral Med/Surg Unit  Palliative Care Primary Diagnosis Other (Comment)  Date Notified 12/11/20  Palliative Care Type New Palliative care  Reason for referral Clarify Goals of Care  Date of Admission 12/08/20  Date first seen by Palliative Care 12/13/20  # of days Palliative referral response time 2 Day(s)  # of days IP prior to Palliative referral 3  Clinical Assessment   Palliative Performance Scale Score 30%  Pain Max last 24 hours Not able to report  Pain Min Last 24 hours Not able to report  Dyspnea Max Last 24 Hours Not able to report  Dyspnea Min Last 24 hours Not able to report  Psychosocial & Spiritual Assessment   Palliative Care Outcomes        Time In: 1240 Time Out: 1350 Time Total: 70 minutes  Greater than 50%  of this time was spent counseling and coordinating care related to the above assessment and plan.  Signed by: Drue Novel, NP   Please contact Palliative Medicine Team phone at [phone] for questions and concerns.  For individual provider: See Shea Evans

## 2020-12-13 NOTE — Progress Notes (Signed)
PROGRESS NOTE  [name]    DOB: [birth year], 75 y.o.  NWG:[id number]  PCP: Birdie Sons, MD   Code Status: Prior   DOA: 12/08/2020   LOS: 1  Brief Narrative of Current Hospitalization  [name] is a 75 y.o. female with a PMH significant for asthma, type II DM, CKD 4, HTN, HLD, HFpEF, CAD, internal hemorrhoids, diabetic retinopathy, urinary incontinence, OA, diverticulosis, MM. They presented from relatives home to the ED on 12/08/2020 with anorexia, urinary frequency, increased agitation x several days. In the ED, it was found that they had urinary tract infection with possible pyelonephritis.  She was febrile and tachycardic. They were treated with IV antibiotics and fluid hydration.  Patient was admitted to medicine service for further workup and management of UTI, possible pyelonephritis as outlined in detail below.  12/13/20 -stabile, afebrile  Assessment & Plan  Active Problems:   Intermittent asthma, uncontrolled   Type 2 diabetes mellitus with stage 4 chronic kidney disease (HCC)   Chronic edema   Essential hypertension   Hyperlipidemia   Congestive heart failure (HCC)   CAD in native artery   UTI (urinary tract infection)  Acute cystitis- likely pyelonephritis- patient was febrile overnight and complains of flank pain (which she attributes to the bed). CT was without contrast so not clear evaluation of kidneys for pyelo. S/p diflucan. WBC 9.5>14.1 - renal US - continue IV Abx - monitor fever curve - tylenol PRN for fever/discomfort - CBC/BMP am - air mattress - discontinue oxybutynin   HTN  CAD  HFpEF HLD- remains moderately hypertensive despite being on home medications. Will continue to monitor in acute illness. - PCP to titrate BP medications PRN  Mild-moderate dementia with behavioral disturbances- patient A&O x2 and cooperative, able to follow commands - delirium precautions   CKD4- Cr worsened since admission 1.45>2.13 - IV fluids today - BMP  am  Type II DM- in remission. recent hgb A1c 6.4 which is well below goal of 8 for age. Diet controlled - follow with regular labs  DVT prophylaxis: lovenox  Diet:  Diet Orders (From admission, onward)     Start     Ordered   12/12/20 1144  Diet regular Room service appropriate? Yes; Fluid consistency: Thin  Diet effective now       Question Answer Comment  Room service appropriate? Yes   Fluid consistency: Thin      12/12/20 1143            Subjective 12/13/20    Pt reports complaints of back/flank pain from bed. Denies dysuria.   Disposition Plan & Communication  Patient status: Inpatient  Admitted From: Home Disposition: Skilled nursing facility Anticipated discharge date: 12/14  Family Communication: none  Consults, Procedures, Significant Events  Consultants:  none  Procedures/significant events:  None  Antimicrobials:  Anti-infectives (From admission, onward)    Start     Dose/Rate Route Frequency Ordered Stop   12/12/20 1845  cefTRIAXone (ROCEPHIN) 1 g in sodium chloride 0.9 % 100 mL IVPB        1 g 200 mL/hr over 30 Minutes Intravenous  Once 12/12/20 1832 12/12/20 2000   12/12/20 1000  cephALEXin (KEFLEX) capsule 500 mg        500 mg Oral Every 12 hours 12/12/20 0656 12/19/20 0959       Objective   Vitals:   12/12/20 2321 12/12/20 2331 12/12/20 2352 12/13/20 0424  BP: 139/68 (!) 117/53 (!) 128/56 (!) 149/68  Pulse:  94 88 (!) 101 95  Resp: (!) 26 (!) 24 (!) 21 18  Temp: 99.2 F (37.3 C)  100.1 F (37.8 C) 98 F (36.7 C)  TempSrc: Oral  Oral   SpO2: 95% 94% 98% 97%  Weight:      Height:        Intake/Output Summary (Last 24 hours) at 12/13/2020 0651 Last data filed at 12/12/2020 2352 Gross per 24 hour  Intake 1100 ml  Output 1 ml  Net 1099 ml   Filed Weights   12/08/20 0903  Weight: 103 kg    Patient BMI: Body mass index is 44.33 kg/m.   Physical Exam:  General: awake, alert, NAD Respiratory: normal respiratory  effort. Cardiovascular: normal S1/S2, RRR, no JVD, murmurs, quick capillary refill  Gastrointestinal: soft, NT, ND Nervous: A&O x2. Agitated  Extremities: moves all equally, trace LE edema Skin: dry, intact, normal temperature, normal color. No rashes, lesions or ulcers on exposed skin Psychiatry: agitated   Labs   I have personally reviewed following labs and imaging studies Admission on 12/08/2020  Component Date Value Ref Range Status   WBC 12/08/2020 6.7  4.0 - 10.5 K/uL Final   RBC 12/08/2020 3.52 (L)  3.87 - 5.11 MIL/uL Final   Hemoglobin 12/08/2020 9.8 (L)  12.0 - 15.0 g/dL Final   HCT 12/08/2020 32.2 (L)  36.0 - 46.0 % Final   MCV 12/08/2020 91.5  80.0 - 100.0 fL Final   MCH 12/08/2020 27.8  26.0 - 34.0 pg Final   MCHC 12/08/2020 30.4  30.0 - 36.0 g/dL Final   RDW 12/08/2020 14.6  11.5 - 15.5 % Final   Platelets 12/08/2020 303  150 - 400 K/uL Final   nRBC 12/08/2020 0.0  0.0 - 0.2 % Final   Neutrophils Relative % 12/08/2020 54  % Final   Neutro Abs 12/08/2020 3.6  1.7 - 7.7 K/uL Final   Lymphocytes Relative 12/08/2020 33  % Final   Lymphs Abs 12/08/2020 2.2  0.7 - 4.0 K/uL Final   Monocytes Relative 12/08/2020 8  % Final   Monocytes Absolute 12/08/2020 0.6  0.1 - 1.0 K/uL Final   Eosinophils Relative 12/08/2020 5  % Final   Eosinophils Absolute 12/08/2020 0.3  0.0 - 0.5 K/uL Final   Basophils Relative 12/08/2020 0  % Final   Basophils Absolute 12/08/2020 0.0  0.0 - 0.1 K/uL Final   Immature Granulocytes 12/08/2020 0  % Final   Abs Immature Granulocytes 12/08/2020 0.01  0.00 - 0.07 K/uL Final   Sodium 12/08/2020 138  135 - 145 mmol/L Final   Potassium 12/08/2020 3.2 (L)  3.5 - 5.1 mmol/L Final   Chloride 12/08/2020 105  98 - 111 mmol/L Final   CO2 12/08/2020 29  22 - 32 mmol/L Final   Glucose, Bld 12/08/2020 118 (H)  70 - 99 mg/dL Final   BUN 12/08/2020 23  8 - 23 mg/dL Final   Creatinine, Ser 12/08/2020 1.45 (H)  0.44 - 1.00 mg/dL Final   Calcium 12/08/2020 9.1  8.9 -  10.3 mg/dL Final   Total Protein 12/08/2020 8.2 (H)  6.5 - 8.1 g/dL Final   Albumin 12/08/2020 2.9 (L)  3.5 - 5.0 g/dL Final   AST 12/08/2020 14 (L)  15 - 41 U/L Final   ALT 12/08/2020 7  0 - 44 U/L Final   Alkaline Phosphatase 12/08/2020 68  38 - 126 U/L Final   Total Bilirubin 12/08/2020 0.5  0.3 - 1.2 mg/dL Final  GFR, Estimated 12/08/2020 37 (L)  >60 mL/min Final   Anion gap 12/08/2020 4 (L)  5 - 15 Final   Color, Urine 12/08/2020 YELLOW  YELLOW Final   APPearance 12/08/2020 CLEAR (A)  CLEAR Final   Specific Gravity, Urine 12/08/2020 1.025  1.005 - 1.030 Final   pH 12/08/2020 6.0  5.0 - 8.0 Final   Glucose, UA 12/08/2020 NEGATIVE  NEGATIVE mg/dL Final   Hgb urine dipstick 12/08/2020 MODERATE (A)  NEGATIVE Final   Bilirubin Urine 12/08/2020 NEGATIVE  NEGATIVE Final   Ketones, ur 12/08/2020 NEGATIVE  NEGATIVE mg/dL Final   Protein, ur 12/08/2020 >300 (A)  NEGATIVE mg/dL Final   Nitrite 12/08/2020 NEGATIVE  NEGATIVE Final   Leukocytes,Ua 12/08/2020 SMALL (A)  NEGATIVE Final   RBC / HPF 12/08/2020 11-20  0 - 5 RBC/hpf Final   WBC, UA 12/08/2020 >50 (H)  0 - 5 WBC/hpf Final   Bacteria, UA 12/08/2020 NONE SEEN  NONE SEEN Final   Squamous Epithelial / LPF 12/08/2020 6-10  0 - 5 Final   Mucus 12/08/2020 PRESENT   Final   Hyaline Casts, UA 12/08/2020 PRESENT   Final   Specimen Description 12/08/2020    Final                   Value:URINE, CLEAN CATCH Performed at [facility], [address], [place], [place] [address]    Special Requests 12/08/2020    Final                   Value:NONE Performed at [facility], [address] [place], [place] [address]    Culture 12/08/2020 MULTIPLE SPECIES PRESENT, SUGGEST RECOLLECTION (A)   Final   Report Status 12/08/2020 12/09/2020 FINAL   Final   SARS Coronavirus 2 by RT PCR 12/08/2020 NEGATIVE  NEGATIVE Final   Influenza A by PCR 12/08/2020 NEGATIVE  NEGATIVE Final   Influenza B by PCR 12/08/2020 NEGATIVE  NEGATIVE Final    Lactic Acid, Venous 12/11/2020 1.0  0.5 - 1.9 mmol/L Final   Sodium 12/11/2020 135  135 - 145 mmol/L Final   Potassium 12/11/2020 3.2 (L)  3.5 - 5.1 mmol/L Final   Chloride 12/11/2020 100  98 - 111 mmol/L Final   CO2 12/11/2020 26  22 - 32 mmol/L Final   Glucose, Bld 12/11/2020 171 (H)  70 - 99 mg/dL Final   BUN 12/11/2020 18  8 - 23 mg/dL Final   Creatinine, Ser 12/11/2020 1.38 (H)  0.44 - 1.00 mg/dL Final   Calcium 12/11/2020 9.0  8.9 - 10.3 mg/dL Final   Total Protein 12/11/2020 7.6  6.5 - 8.1 g/dL Final   Albumin 12/11/2020 2.4 (L)  3.5 - 5.0 g/dL Final   AST 12/11/2020 12 (L)  15 - 41 U/L Final   ALT 12/11/2020 6  0 - 44 U/L Final   Alkaline Phosphatase 12/11/2020 64  38 - 126 U/L Final   Total Bilirubin 12/11/2020 0.9  0.3 - 1.2 mg/dL Final   GFR, Estimated 12/11/2020 40 (L)  >60 mL/min Final   Anion gap 12/11/2020 9  5 - 15 Final   WBC 12/11/2020 9.5  4.0 - 10.5 K/uL Final   RBC 12/11/2020 3.43 (L)  3.87 - 5.11 MIL/uL Final   Hemoglobin 12/11/2020 9.6 (L)  12.0 - 15.0 g/dL Final   HCT 12/11/2020 30.3 (L)  36.0 - 46.0 % Final   MCV 12/11/2020 88.3  80.0 - 100.0 fL Final   MCH  12/11/2020 28.0  26.0 - 34.0 pg Final   MCHC 12/11/2020 31.7  30.0 - 36.0 g/dL Final   RDW 12/11/2020 14.0  11.5 - 15.5 % Final   Platelets 12/11/2020 275  150 - 400 K/uL Final   nRBC 12/11/2020 0.0  0.0 - 0.2 % Final   Neutrophils Relative % 12/11/2020 80  % Final   Neutro Abs 12/11/2020 7.7  1.7 - 7.7 K/uL Final   Lymphocytes Relative 12/11/2020 11  % Final   Lymphs Abs 12/11/2020 1.0  0.7 - 4.0 K/uL Final   Monocytes Relative 12/11/2020 8  % Final   Monocytes Absolute 12/11/2020 0.7  0.1 - 1.0 K/uL Final   Eosinophils Relative 12/11/2020 0  % Final   Eosinophils Absolute 12/11/2020 0.0  0.0 - 0.5 K/uL Final   Basophils Relative 12/11/2020 0  % Final   Basophils Absolute 12/11/2020 0.0  0.0 - 0.1 K/uL Final   Immature Granulocytes 12/11/2020 1  % Final   Abs Immature Granulocytes 12/11/2020 0.06   0.00 - 0.07 K/uL Final   Prothrombin Time 12/11/2020 14.2  11.4 - 15.2 seconds Final   INR 12/11/2020 1.1  0.8 - 1.2 Final   aPTT 12/11/2020 25  24 - 36 seconds Final   Specimen Description 12/11/2020 BLOOD BLOOD LEFT FOREARM   Final   Special Requests 12/11/2020 BOTTLES DRAWN AEROBIC AND ANAEROBIC Blood Culture adequate volume   Final   Culture 12/11/2020    Final                   Value:NO GROWTH 2 DAYS Performed at [facility], [address] [place], [place] [address]    Report Status 12/11/2020 PENDING   Incomplete   Specimen Description 12/11/2020 BLOOD BLOOD RIGHT FOREARM   Final   Special Requests 12/11/2020 BOTTLES DRAWN AEROBIC AND ANAEROBIC Blood Culture adequate volume   Final   Culture 12/11/2020    Final                   Value:NO GROWTH 2 DAYS Performed at [facility], [address] [place], [place] [address]    Report Status 12/11/2020 PENDING   Incomplete   Color, Urine 12/12/2020 YELLOW (A)  YELLOW Final   APPearance 12/12/2020 CLOUDY (A)  CLEAR Final   Specific Gravity, Urine 12/12/2020 1.018  1.005 - 1.030 Final   pH 12/12/2020 5.0  5.0 - 8.0 Final   Glucose, UA 12/12/2020 50 (A)  NEGATIVE mg/dL Final   Hgb urine dipstick 12/12/2020 MODERATE (A)  NEGATIVE Final   Bilirubin Urine 12/12/2020 NEGATIVE  NEGATIVE Final   Ketones, ur 12/12/2020 20 (A)  NEGATIVE mg/dL Final   Protein, ur 12/12/2020 >=300 (A)  NEGATIVE mg/dL Final   Nitrite 12/12/2020 NEGATIVE  NEGATIVE Final   Leukocytes,Ua 12/12/2020 LARGE (A)  NEGATIVE Final   RBC / HPF 12/12/2020 >50 (H)  0 - 5 RBC/hpf Final   WBC, UA 12/12/2020 >50 (H)  0 - 5 WBC/hpf Final   Bacteria, UA 12/12/2020 RARE (A)  NONE SEEN Final   Squamous Epithelial / LPF 12/12/2020 11-20  0 - 5 Final   WBC Clumps 12/12/2020 PRESENT   Final   Mucus 12/12/2020 PRESENT   Final   Budding Yeast 12/12/2020 PRESENT   Final   Hyaline Casts, UA 12/12/2020 PRESENT   Final   SARS Coronavirus 2 by RT PCR 12/12/2020  NEGATIVE  NEGATIVE Final   Influenza A by PCR 12/12/2020 NEGATIVE  NEGATIVE Final  Influenza B by PCR 12/12/2020 NEGATIVE  NEGATIVE Final    Imaging Studies  DG Chest Port 1 View  Result Date: 12/11/2020 CLINICAL DATA:  Questionable sepsis EXAM: PORTABLE CHEST 1 VIEW COMPARISON:  07/14/2015 FINDINGS: Heart and mediastinal contours are within normal limits. No focal opacities or effusions. No acute bony abnormality. IMPRESSION: No active disease. Electronically Signed   By: Rolm Baptise M.D.   On: 12/11/2020 21:40    Medications   Scheduled Meds:  amLODipine  5 mg Oral Daily   cephALEXin  500 mg Oral Q12H   cholecalciferol  2,000 Units Oral Daily   donepezil  5 mg Oral QHS   lisinopril  10 mg Oral Daily   metoprolol tartrate  75 mg Oral BID   oxybutynin  5 mg Oral BID   QUEtiapine  25 mg Oral QHS   cyanocobalamin  1,000 mcg Oral Daily   No recently discontinued medications to reconcile  LOS: 1 day   Time spent: >1min  Lorelei Heikkila L Quamere Mussell, DO [facility] 12/13/2020, 6:51 AM   Available by Epic secure chat 7AM-7PM. If 7PM-7AM, please contact night-coverage Refer to [email or web address] to contact the [facility] Attending or Consulting provider for this pt

## 2020-12-13 NOTE — Progress Notes (Signed)
Physical Therapy Treatment Patient Details Name: [name] MRN: [id number] DOB: 01-Dec-1945 Today's Date: 12/13/2020   History of Present Illness Pt is a 75 y/o F with PMH: DM, CAD, HTN, and dementia who presents due to urinary frequency and for wound check.  Per EMS, the patient's daughter notes that the patient has been on several courses of antibiotics over the last several weeks for UTI.    PT Comments    Co-tx with OT - 1 unit billed each per protocol  OT in with pt upon arrival trying to get pt to commode and needing assist.  Pt assisted to fully upright with max a x 1.  Supervision in sitting with cues to sit upright and not lean left.  She attempts to stand x 2 with max a x 2 but is unable to fully clear hips to safely transfer to commode.  She is assisted back to supine with max a x 2 and care is provided as she is inc of BM.  Rolling left and right with cues and assist.  Pt yells out to light touch that is not reasonable for pressure applied.  Will yell out sharply then stop on varied body parts.  Pt does c/o general discomfort from immobility especially in her back.  Pt will need +2 assist for OOB attempts and Hoyer type lift is appropriate if needed by staff.   Recommendations for follow up therapy are one component of a multi-disciplinary discharge planning process, led by the attending physician.  Recommendations may be updated based on patient status, additional functional criteria and insurance authorization.  Follow Up Recommendations  Skilled nursing-short term rehab (<3 hours/day)     Assistance Recommended at Discharge Frequent or constant Supervision/Assistance  Equipment Recommendations       Recommendations for Other Services       Precautions / Restrictions Precautions Precautions: Fall Restrictions Weight Bearing Restrictions: No     Mobility  Bed Mobility Overal bed mobility: Needs Assistance Bed Mobility: Rolling;Supine to Sit;Sit to  Supine Rolling: Mod assist;Max assist   Supine to sit: Max assist Sit to supine: Max assist;+2 for physical assistance   General bed mobility comments: vcs for upright seated posture, P sitting balance at EOB    Transfers Overall transfer level: Needs assistance Equipment used: Rolling walker (2 wheels) Transfers: Sit to/from Stand Sit to Stand: Max assist;+2 physical assistance                Ambulation/Gait               General Gait Details: unsafe to attempt   Stairs             Wheelchair Mobility    Modified Rankin (Stroke Patients Only)       Balance Overall balance assessment: Needs assistance Sitting-balance support: Feet unsupported;Bilateral upper extremity supported Sitting balance-Leahy Scale: Poor   Postural control: Posterior lean;Left lateral lean Standing balance support: Bilateral upper extremity supported;Reliant on assistive device for balance Standing balance-Leahy Scale: Poor Standing balance comment: relies on +2 assist from staff                            Cognition Arousal/Alertness: Awake/alert Behavior During Therapy: Agitated Overall Cognitive Status: No family/caregiver present to determine baseline cognitive functioning  General Comments: pt oriented to self and place; poor insight        Exercises Other Exercises Other Exercises: edu: role of OT, role of rehab, importance of mobility; poor carry over    General Comments General comments (skin integrity, edema, etc.): Pt with poor tolerance for therapy, reported pain with all mobilty limiting participation on this date      Pertinent Vitals/Pain Pain Assessment: Faces Faces Pain Scale: Hurts even more Pain Location: yells out with any light touch.  yelling out is not reasonable for touch but pt generally agitated overall with tasks and stops yelling quickly Pain Descriptors / Indicators:  Grimacing;Other (Comment) (yells out) Pain Intervention(s): Limited activity within patient's tolerance;Monitored during session;Repositioned    Home Living                          Prior Function            PT Goals (current goals can now be found in the care plan section) Progress towards PT goals: Not progressing toward goals - comment    Frequency    Min 2X/week      PT Plan Current plan remains appropriate    Co-evaluation PT/OT/SLP Co-Evaluation/Treatment: Yes Reason for Co-Treatment: For patient/therapist safety PT goals addressed during session: Mobility/safety with mobility OT goals addressed during session: ADL's and self-care      AM-PAC PT "6 Clicks" Mobility   Outcome Measure  Help needed turning from your back to your side while in a flat bed without using bedrails?: A Lot Help needed moving from lying on your back to sitting on the side of a flat bed without using bedrails?: Total Help needed moving to and from a bed to a chair (including a wheelchair)?: Total Help needed standing up from a chair using your arms (e.g., wheelchair or bedside chair)?: Total Help needed to walk in hospital room?: Total Help needed climbing 3-5 steps with a railing? : Total 6 Click Score: 7    End of Session Equipment Utilized During Treatment: Gait belt Activity Tolerance: Treatment limited secondary to agitation;Patient limited by pain Patient left: in bed;with call bell/phone within reach;with bed alarm set Nurse Communication: Mobility status PT Visit Diagnosis: Unsteadiness on feet (R26.81);Muscle weakness (generalized) (M62.81);Difficulty in walking, not elsewhere classified (R26.2)     Time: 4401-0272 PT Time Calculation (min) (ACUTE ONLY): 15 min  Charges:  $Therapeutic Activity: 8-22 mins                    Chesley Noon, PTA 12/13/20, 12:57 PM

## 2020-12-13 NOTE — Progress Notes (Incomplete)
Patient presented via transport from ED. Patient initially

## 2020-12-13 NOTE — TOC Progression Note (Addendum)
Transition of Care [facility]) - Progression Note    Patient Details  Name: [name] MRN: [id number] Date of Birth: 1945/04/20  Transition of Care [facility]) CM/SW [place], RN Phone Number: 12/13/2020, 9:46 AM  Clinical Narrative:  [facility] offered bed room #709, text Attending who indicated that patient is NMS for discharge today due to fever, [facility] notified. Palliative spoke with patient daughter, Code Status changed patient to discharge to [facility] and then to home with [facility].    Expected Discharge Plan: Skilled Nursing Facility Barriers to Discharge: Family Issues (home is unsafe currently)  Expected Discharge Plan and Services Expected Discharge Plan: [place]   Discharge Planning Services: CM Consult Post Acute Care Choice: [place] arrangements for the past 2 months: Single Family Home                 DME Arranged: N/A DME Agency: NA         HH Agency: [place]         Social Determinants of Health (SDOH) Interventions    Readmission Risk Interventions No flowsheet data found.

## 2020-12-13 NOTE — Progress Notes (Signed)
Occupational Therapy Treatment Patient Details Name: [name] MRN: [id number] DOB: 05/12/1945 Today's Date: 12/13/2020   History of present illness Pt is a 75 y/o F with PMH: DM, CAD, HTN, and dementia who presents due to urinary frequency and for wound check.  Per EMS, the patient's daughter notes that the patient has been on several courses of antibiotics over the last several weeks for UTI.   OT comments  Chart reviewed, pt greeted in bed, agreeable to session however requires step by step vcs for task participation. Co-tx with PT on this date due to physical assistance required. MAX Ax2 required for bed mobility, STS MAX A x2 with RW, unable achieve full standing after 2 attempts. Pt with BM, requires TOTAL A for peri care, MOD-MAX A for rolling. Pt is left as received, NAD, all needs met. RN aware of pt status. OT will continue to follow acutely, updated recommended frequency to 2x per week as pt was moved to inpatient status. Recommend STR to address functional deficits.    Recommendations for follow up therapy are one component of a multi-disciplinary discharge planning process, led by the attending physician.  Recommendations may be updated based on patient status, additional functional criteria and insurance authorization.    Follow Up Recommendations  Skilled nursing-short term rehab (<3 hours/day)    Assistance Recommended at Discharge Frequent or constant Supervision/Assistance  Equipment Recommendations  Other (comment) (per next venue of care)    Recommendations for Other Services      Precautions / Restrictions Precautions Precautions: Fall       Mobility Bed Mobility Overal bed mobility: Needs Assistance Bed Mobility: Rolling;Supine to Sit;Sit to Supine Rolling: Mod assist;Max assist   Supine to sit: Max assist Sit to supine: Max assist   General bed mobility comments: vcs for upright seated posture, P sitting balance at EOB    Transfers Overall transfer  level: Needs assistance Equipment used: Rolling walker (2 wheels) Transfers: Sit to/from Stand Sit to Stand: Max assist;+2 physical assistance                 Balance Overall balance assessment: Needs assistance Sitting-balance support: Feet unsupported;Bilateral upper extremity supported Sitting balance-Leahy Scale: Poor   Postural control: Posterior lean;Left lateral lean                                 ADL either performed or assessed with clinical judgement   ADL                                         General ADL Comments: MOD A for UB ADLs, MAX/TOTAL A for LB ADLs, MAX Ax1 for bed mobiltiy, MAX A x2 for STS with RW, MOD-MAX A for rolling for peri care, TOTAL A for peri care    Extremity/Trunk Assessment Upper Extremity Assessment Upper Extremity Assessment: Generalized weakness   Lower Extremity Assessment Lower Extremity Assessment: Generalized weakness         Cognition Arousal/Alertness: Awake/alert Behavior During Therapy: Agitated Overall Cognitive Status: No family/caregiver present to determine baseline cognitive functioning                                 General Comments: pt oriented to self and place; poor insight            Exercises Other Exercises Other Exercises: edu: role of OT, role of rehab, importance of mobility; poor carry over      General Comments Pt with poor tolerance for therapy, reported pain with all mobilty limiting participation on this date    Pertinent Vitals/ Pain       Pain Assessment: Faces Faces Pain Scale: Hurts whole lot Pain Location: pt c/o pain from sleeping in hospital bed, generalized pain with mobility Pain Descriptors / Indicators: Sore;Aching;Grimacing Pain Intervention(s): Monitored during session;Limited activity within patient's tolerance;Repositioned   Frequency  Min 2X/week        Progress Toward Goals  OT Goals(current goals can now be found in the  care plan section)  Progress towards OT goals: Progressing toward goals     Plan Discharge plan remains appropriate    Co-evaluation    PT/OT/SLP Co-Evaluation/Treatment: Yes Reason for Co-Treatment: For patient/therapist safety;Necessary to address cognition/behavior during functional activity;To address functional/ADL transfers   OT goals addressed during session: ADL's and self-care      AM-PAC OT "6 Clicks" Daily Activity     Outcome Measure   Help from another person eating meals?: None Help from another person taking care of personal grooming?: A Lot Help from another person toileting, which includes using toliet, bedpan, or urinal?: A Lot Help from another person bathing (including washing, rinsing, drying)?: A Lot Help from another person to put on and taking off regular upper body clothing?: A Lot Help from another person to put on and taking off regular lower body clothing?: A Lot 6 Click Score: 14    End of Session Equipment Utilized During Treatment: Gait belt;Rolling walker (2 wheels)  OT Visit Diagnosis: Unsteadiness on feet (R26.81);Muscle weakness (generalized) (M62.81)   Activity Tolerance Patient limited by pain   Patient Left in bed;with call bell/phone within reach;with bed alarm set   Nurse Communication Mobility status        Time: 8563-1497 OT Time Calculation (min): 35 min  Charges: OT General Charges $OT Visit: 1 Visit OT Treatments $Self Care/Home Management : 8-22 mins Shanon Payor, OTD OTR/L  12/13/20, 12:27 PM

## 2020-12-14 DIAGNOSIS — R609 Edema, unspecified: Secondary | ICD-10-CM

## 2020-12-14 DIAGNOSIS — L304 Erythema intertrigo: Secondary | ICD-10-CM

## 2020-12-14 DIAGNOSIS — I5022 Chronic systolic (congestive) heart failure: Secondary | ICD-10-CM

## 2020-12-14 LAB — BASIC METABOLIC PANEL
Anion gap: 7 (ref 5–15)
BUN: 44 mg/dL — ABNORMAL HIGH (ref 8–23)
CO2: 27 mmol/L (ref 22–32)
Calcium: 8.7 mg/dL — ABNORMAL LOW (ref 8.9–10.3)
Chloride: 105 mmol/L (ref 98–111)
Creatinine, Ser: 2.51 mg/dL — ABNORMAL HIGH (ref 0.44–1.00)
GFR, Estimated: 19 mL/min — ABNORMAL LOW (ref >60)
Glucose, Bld: 107 mg/dL — ABNORMAL HIGH (ref 70–99)
Potassium: 2.9 mmol/L — ABNORMAL LOW (ref 3.5–5.1)
Sodium: 139 mmol/L (ref 135–145)

## 2020-12-14 LAB — CBC
HCT: 26.6 % — ABNORMAL LOW (ref 36.0–46.0)
Hemoglobin: 8.4 g/dL — ABNORMAL LOW (ref 12.0–15.0)
MCH: 28.1 pg (ref 26.0–34.0)
MCHC: 31.6 g/dL (ref 30.0–36.0)
MCV: 89 fL (ref 80.0–100.0)
Platelets: 302 10*3/uL (ref 150–400)
RBC: 2.99 MIL/uL — ABNORMAL LOW (ref 3.87–5.11)
RDW: 14.4 % (ref 11.5–15.5)
WBC: 10 10*3/uL (ref 4.0–10.5)
nRBC: 0 % (ref 0.0–0.2)

## 2020-12-14 MED ORDER — POTASSIUM CHLORIDE 10 MEQ/100ML IV SOLN: 10.0000 meq | INTRAVENOUS | Status: DC

## 2020-12-14 MED ORDER — POTASSIUM CHLORIDE IN NACL 40-0.9 MEQ/L-% IV SOLN
INTRAVENOUS | Status: DC
  Filled 2020-12-14 (×4): qty 1000

## 2020-12-14 MED ORDER — CEPHALEXIN 500 MG PO CAPS
500.0000 mg | ORAL_CAPSULE | Freq: Three times a day (TID) | ORAL | 0 refills | Status: AC
Start: 2020-12-14 — End: 2020-12-19

## 2020-12-14 MED ORDER — SODIUM CHLORIDE 0.9 % IV SOLN: INTRAVENOUS | Status: DC

## 2020-12-14 MED ORDER — POTASSIUM CHLORIDE CRYS ER 20 MEQ PO TBCR
40.0000 meq | EXTENDED_RELEASE_TABLET | Freq: Two times a day (BID) | ORAL | Status: AC
  Administered 2020-12-14 (×2): 40 meq via ORAL
  Filled 2020-12-14 (×2): qty 2

## 2020-12-14 NOTE — Progress Notes (Signed)
PROGRESS NOTE  [name]    DOB: 05-20-1945, 75 y.o.  MGQ:[id number]  PCP: Birdie Sons, MD   Code Status: DNR   DOA: 12/08/2020   LOS: 2  Brief Narrative of Current Hospitalization  [name] is a 75 y.o. female with a PMH significant for asthma, type II DM, CKD 4, HTN, HLD, HFpEF, CAD, internal hemorrhoids, diabetic retinopathy, urinary incontinence, OA, diverticulosis, MM. They presented from relatives home to the ED on 12/08/2020 with anorexia, urinary frequency, increased agitation x several days. In the ED, it was found that they had urinary tract infection with possible pyelonephritis.  She was febrile and tachycardic. They were treated with IV antibiotics and fluid hydration.  Patient was admitted to medicine service for further workup and management of UTI, possible pyelonephritis as outlined in detail below.  12/14/20 -stabile, afebrile  Assessment & Plan  Active Problems:   Intermittent asthma, uncontrolled   Type 2 diabetes mellitus with stage 4 chronic kidney disease (HCC)   Chronic edema   Essential hypertension   Hyperlipidemia   Intertrigo   Congestive heart failure (HCC)   CAD in native artery   Acute UTI   Ambulatory dysfunction   Pyelonephritis  Acute cystitis- likely pyelonephritis- urinalysis positive for yeast, >50 RBCs, leukocytes, etc- looks like was not a clean catch. afebrile >24 hours. Endorses continued back pain which has improved somewhat and is attributed to bad mattress, per patient.  CT was without contrast so not clear evaluation of kidneys for pyelo. S/p diflucan. WBC 9.5>14.1>10.1. renal US showed medical renal disease but not hydro - continue Abx - monitor fever curve - tylenol PRN for fever/discomfort - CBC/BMP am - air mattress - discontinued home oxybutynin  - strict I&O  HTN   CAD   HFpEF  HLD- normotensive while on home meds - holding home lasix/lisinopril given AKI  Mild-moderate dementia with behavioral disturbances-  patient A&O x2 and cooperative, able to follow commands - delirium precautions   AKI on CKD4   hypokalemia- Cr worsened since admission 1.45>2.13>2.51 - IV fluids today with potassium supplement - K+ replete PRN - BMP am  Type II DM- in remission. recent hgb A1c 6.4 which is well below goal of 8 for age. Diet controlled - follow with regular labs  DVT prophylaxis: enoxaparin (LOVENOX) injection 40 mg Start: 12/13/20 2200enoxaparin (LOVENOX) injection 40 mg Start: 12/13/20 2200lovenox  Diet:  Diet Orders (From admission, onward)     Start     Ordered   12/12/20 1144  Diet regular Room service appropriate? Yes; Fluid consistency: Thin  Diet effective now       Question Answer Comment  Room service appropriate? Yes   Fluid consistency: Thin      12/12/20 1143            Subjective 12/14/20    Pt reports improved pain. Denies dysuria or concerns today.  Disposition Plan & Communication  Patient status: Inpatient  Admitted From: Home Disposition: Skilled nursing facility Anticipated discharge date: 12/14  Family Communication: none  Consults, Procedures, Significant Events  Consultants:  none  Procedures/significant events:  None  Antimicrobials:  Anti-infectives (From admission, onward)    Start     Dose/Rate Route Frequency Ordered Stop   12/14/20 0000  cephALEXin (KEFLEX) 500 MG capsule        500 mg Oral 3 times daily 12/14/20 0944 12/19/20 2359   12/12/20 1845  cefTRIAXone (ROCEPHIN) 1 g in sodium chloride 0.9 % 100 mL  IVPB        1 g 200 mL/hr over 30 Minutes Intravenous  Once 12/12/20 1832 12/12/20 2000   12/12/20 1000  cephALEXin (KEFLEX) capsule 500 mg        500 mg Oral Every 12 hours 12/12/20 0656 12/19/20 0959       Objective   Vitals:   12/13/20 1956 12/14/20 0057 12/14/20 0319 12/14/20 0736  BP: (!) 123/58 (!) 112/49 103/65 (!) 130/50  Pulse: 90 73 80 87  Resp: 17 17 18 16   Temp: 98.9 F (37.2 C) 98.9 F (37.2 C) 98.4 F (36.9 C) 98.1 F  (36.7 C)  TempSrc:   Oral Oral  SpO2: 97% 97% 97% 95%  Weight:    102 kg  Height:    5' (1.524 m)    Intake/Output Summary (Last 24 hours) at 12/14/2020 1115 Last data filed at 12/14/2020 0314 Gross per 24 hour  Intake 0 ml  Output 1 ml  Net -1 ml    Filed Weights   12/08/20 0903 12/14/20 0736  Weight: 103 kg 102 kg    Patient BMI: Body mass index is 43.92 kg/m.   Physical Exam:  General: awake, alert, NAD Respiratory: normal respiratory effort. Cardiovascular: normal S1/S2, RRR, no JVD, murmurs, quick capillary refill  Gastrointestinal: soft, NT, ND Nervous: A&O x2. Normal speech  Extremities: moves all equally, trace LE edema Skin: dry, intact, normal temperature, normal color. No rashes, lesions or ulcers on exposed skin Psychiatry: normal mood, affect  Labs   I have personally reviewed following labs and imaging studies Admission on 12/08/2020  Component Date Value Ref Range Status   WBC 12/08/2020 6.7  4.0 - 10.5 K/uL Final   RBC 12/08/2020 3.52 (L)  3.87 - 5.11 MIL/uL Final   Hemoglobin 12/08/2020 9.8 (L)  12.0 - 15.0 g/dL Final   HCT 12/08/2020 32.2 (L)  36.0 - 46.0 % Final   MCV 12/08/2020 91.5  80.0 - 100.0 fL Final   MCH 12/08/2020 27.8  26.0 - 34.0 pg Final   MCHC 12/08/2020 30.4  30.0 - 36.0 g/dL Final   RDW 12/08/2020 14.6  11.5 - 15.5 % Final   Platelets 12/08/2020 303  150 - 400 K/uL Final   nRBC 12/08/2020 0.0  0.0 - 0.2 % Final   Neutrophils Relative % 12/08/2020 54  % Final   Neutro Abs 12/08/2020 3.6  1.7 - 7.7 K/uL Final   Lymphocytes Relative 12/08/2020 33  % Final   Lymphs Abs 12/08/2020 2.2  0.7 - 4.0 K/uL Final   Monocytes Relative 12/08/2020 8  % Final   Monocytes Absolute 12/08/2020 0.6  0.1 - 1.0 K/uL Final   Eosinophils Relative 12/08/2020 5  % Final   Eosinophils Absolute 12/08/2020 0.3  0.0 - 0.5 K/uL Final   Basophils Relative 12/08/2020 0  % Final   Basophils Absolute 12/08/2020 0.0  0.0 - 0.1 K/uL Final   Immature Granulocytes  12/08/2020 0  % Final   Abs Immature Granulocytes 12/08/2020 0.01  0.00 - 0.07 K/uL Final   Sodium 12/08/2020 138  135 - 145 mmol/L Final   Potassium 12/08/2020 3.2 (L)  3.5 - 5.1 mmol/L Final   Chloride 12/08/2020 105  98 - 111 mmol/L Final   CO2 12/08/2020 29  22 - 32 mmol/L Final   Glucose, Bld 12/08/2020 118 (H)  70 - 99 mg/dL Final   BUN 12/08/2020 23  8 - 23 mg/dL Final   Creatinine, Ser 12/08/2020 1.45 (H)  0.44 -  1.00 mg/dL Final   Calcium 12/08/2020 9.1  8.9 - 10.3 mg/dL Final   Total Protein 12/08/2020 8.2 (H)  6.5 - 8.1 g/dL Final   Albumin 12/08/2020 2.9 (L)  3.5 - 5.0 g/dL Final   AST 12/08/2020 14 (L)  15 - 41 U/L Final   ALT 12/08/2020 7  0 - 44 U/L Final   Alkaline Phosphatase 12/08/2020 68  38 - 126 U/L Final   Total Bilirubin 12/08/2020 0.5  0.3 - 1.2 mg/dL Final   GFR, Estimated 12/08/2020 37 (L)  >60 mL/min Final   Anion gap 12/08/2020 4 (L)  5 - 15 Final   Color, Urine 12/08/2020 YELLOW  YELLOW Final   APPearance 12/08/2020 CLEAR (A)  CLEAR Final   Specific Gravity, Urine 12/08/2020 1.025  1.005 - 1.030 Final   pH 12/08/2020 6.0  5.0 - 8.0 Final   Glucose, UA 12/08/2020 NEGATIVE  NEGATIVE mg/dL Final   Hgb urine dipstick 12/08/2020 MODERATE (A)  NEGATIVE Final   Bilirubin Urine 12/08/2020 NEGATIVE  NEGATIVE Final   Ketones, ur 12/08/2020 NEGATIVE  NEGATIVE mg/dL Final   Protein, ur 12/08/2020 >300 (A)  NEGATIVE mg/dL Final   Nitrite 12/08/2020 NEGATIVE  NEGATIVE Final   Leukocytes,Ua 12/08/2020 SMALL (A)  NEGATIVE Final   RBC / HPF 12/08/2020 11-20  0 - 5 RBC/hpf Final   WBC, UA 12/08/2020 >50 (H)  0 - 5 WBC/hpf Final   Bacteria, UA 12/08/2020 NONE SEEN  NONE SEEN Final   Squamous Epithelial / LPF 12/08/2020 6-10  0 - 5 Final   Mucus 12/08/2020 PRESENT   Final   Hyaline Casts, UA 12/08/2020 PRESENT   Final   Specimen Description 12/08/2020    Final                   Value:URINE, CLEAN CATCH Performed at [facility], [address]  [place], [place] [address]    Special Requests 12/08/2020    Final                   Value:NONE Performed at [facility], [address] [place], [place] [address]    Culture 12/08/2020 MULTIPLE SPECIES PRESENT, SUGGEST RECOLLECTION (A)   Final   Report Status 12/08/2020 12/09/2020 FINAL   Final   SARS Coronavirus 2 by RT PCR 12/08/2020 NEGATIVE  NEGATIVE Final   Influenza A by PCR 12/08/2020 NEGATIVE  NEGATIVE Final   Influenza B by PCR 12/08/2020 NEGATIVE  NEGATIVE Final   Lactic Acid, Venous 12/11/2020 1.0  0.5 - 1.9 mmol/L Final   Sodium 12/11/2020 135  135 - 145 mmol/L Final   Potassium 12/11/2020 3.2 (L)  3.5 - 5.1 mmol/L Final   Chloride 12/11/2020 100  98 - 111 mmol/L Final   CO2 12/11/2020 26  22 - 32 mmol/L Final   Glucose, Bld 12/11/2020 171 (H)  70 - 99 mg/dL Final   BUN 12/11/2020 18  8 - 23 mg/dL Final   Creatinine, Ser 12/11/2020 1.38 (H)  0.44 - 1.00 mg/dL Final   Calcium 12/11/2020 9.0  8.9 - 10.3 mg/dL Final   Total Protein 12/11/2020 7.6  6.5 - 8.1 g/dL Final   Albumin 12/11/2020 2.4 (L)  3.5 - 5.0 g/dL Final   AST 12/11/2020 12 (L)  15 - 41 U/L Final   ALT 12/11/2020 6  0 - 44 U/L Final   Alkaline Phosphatase 12/11/2020 64  38 - 126 U/L Final   Total Bilirubin 12/11/2020 0.9  0.3 - 1.2 mg/dL Final   GFR, Estimated 12/11/2020 40 (L)  >60 mL/min Final   Anion gap 12/11/2020 9  5 - 15 Final   WBC 12/11/2020 9.5  4.0 - 10.5 K/uL Final   RBC 12/11/2020 3.43 (L)  3.87 - 5.11 MIL/uL Final   Hemoglobin 12/11/2020 9.6 (L)  12.0 - 15.0 g/dL Final   HCT 12/11/2020 30.3 (L)  36.0 - 46.0 % Final   MCV 12/11/2020 88.3  80.0 - 100.0 fL Final   MCH 12/11/2020 28.0  26.0 - 34.0 pg Final   MCHC 12/11/2020 31.7  30.0 - 36.0 g/dL Final   RDW 12/11/2020 14.0  11.5 - 15.5 % Final   Platelets 12/11/2020 275  150 - 400 K/uL Final   nRBC 12/11/2020 0.0  0.0 - 0.2 % Final   Neutrophils Relative % 12/11/2020 80  % Final   Neutro Abs 12/11/2020 7.7  1.7 - 7.7 K/uL Final    Lymphocytes Relative 12/11/2020 11  % Final   Lymphs Abs 12/11/2020 1.0  0.7 - 4.0 K/uL Final   Monocytes Relative 12/11/2020 8  % Final   Monocytes Absolute 12/11/2020 0.7  0.1 - 1.0 K/uL Final   Eosinophils Relative 12/11/2020 0  % Final   Eosinophils Absolute 12/11/2020 0.0  0.0 - 0.5 K/uL Final   Basophils Relative 12/11/2020 0  % Final   Basophils Absolute 12/11/2020 0.0  0.0 - 0.1 K/uL Final   Immature Granulocytes 12/11/2020 1  % Final   Abs Immature Granulocytes 12/11/2020 0.06  0.00 - 0.07 K/uL Final   Prothrombin Time 12/11/2020 14.2  11.4 - 15.2 seconds Final   INR 12/11/2020 1.1  0.8 - 1.2 Final   aPTT 12/11/2020 25  24 - 36 seconds Final   Specimen Description 12/11/2020 BLOOD BLOOD LEFT FOREARM   Final   Special Requests 12/11/2020 BOTTLES DRAWN AEROBIC AND ANAEROBIC Blood Culture adequate volume   Final   Culture 12/11/2020    Final                   Value:NO GROWTH 3 DAYS Performed at [facility], [address] [place], [place] [address]    Report Status 12/11/2020 PENDING   Incomplete   Specimen Description 12/11/2020 BLOOD BLOOD RIGHT FOREARM   Final   Special Requests 12/11/2020 BOTTLES DRAWN AEROBIC AND ANAEROBIC Blood Culture adequate volume   Final   Culture 12/11/2020    Final                   Value:NO GROWTH 3 DAYS Performed at [facility], [address] [place], [place] [address]    Report Status 12/11/2020 PENDING   Incomplete   Color, Urine 12/12/2020 YELLOW (A)  YELLOW Final   APPearance 12/12/2020 CLOUDY (A)  CLEAR Final   Specific Gravity, Urine 12/12/2020 1.018  1.005 - 1.030 Final   pH 12/12/2020 5.0  5.0 - 8.0 Final   Glucose, UA 12/12/2020 50 (A)  NEGATIVE mg/dL Final   Hgb urine dipstick 12/12/2020 MODERATE (A)  NEGATIVE Final   Bilirubin Urine 12/12/2020 NEGATIVE  NEGATIVE Final   Ketones, ur 12/12/2020 20 (A)  NEGATIVE mg/dL Final   Protein, ur 12/12/2020 >=300 (A)  NEGATIVE mg/dL Final   Nitrite 12/12/2020 NEGATIVE   NEGATIVE Final   Leukocytes,Ua 12/12/2020 LARGE (A)  NEGATIVE Final   RBC / HPF 12/12/2020 >50 (H)  0 - 5 RBC/hpf Final   WBC, UA 12/12/2020 >50 (H)  0 -  5 WBC/hpf Final   Bacteria, UA 12/12/2020 RARE (A)  NONE SEEN Final   Squamous Epithelial / LPF 12/12/2020 11-20  0 - 5 Final   WBC Clumps 12/12/2020 PRESENT   Final   Mucus 12/12/2020 PRESENT   Final   Budding Yeast 12/12/2020 PRESENT   Final   Hyaline Casts, UA 12/12/2020 PRESENT   Final   Specimen Description 12/12/2020    Final                   Value:IN/OUT CATH URINE Performed at [facility], [address] [place], [place] [address]    Special Requests 12/12/2020    Final                   Value:NONE Performed at [facility], [address] [place], [place] [address]    Culture 12/12/2020 >=100,000 COLONIES/mL YEAST (A)   Final   Report Status 12/12/2020 12/13/2020 FINAL   Final   SARS Coronavirus 2 by RT PCR 12/12/2020 NEGATIVE  NEGATIVE Final   Influenza A by PCR 12/12/2020 NEGATIVE  NEGATIVE Final   Influenza B by PCR 12/12/2020 NEGATIVE  NEGATIVE Final   Sodium 12/13/2020 136  135 - 145 mmol/L Final   Potassium 12/13/2020 3.3 (L)  3.5 - 5.1 mmol/L Final   Chloride 12/13/2020 103  98 - 111 mmol/L Final   CO2 12/13/2020 25  22 - 32 mmol/L Final   Glucose, Bld 12/13/2020 125 (H)  70 - 99 mg/dL Final   BUN 12/13/2020 35 (H)  8 - 23 mg/dL Final   Creatinine, Ser 12/13/2020 2.13 (H)  0.44 - 1.00 mg/dL Final   Calcium 12/13/2020 9.0  8.9 - 10.3 mg/dL Final   GFR, Estimated 12/13/2020 24 (L)  >60 mL/min Final   Anion gap 12/13/2020 8  5 - 15 Final   WBC 12/13/2020 14.1 (H)  4.0 - 10.5 K/uL Final   RBC 12/13/2020 3.39 (L)  3.87 - 5.11 MIL/uL Final   Hemoglobin 12/13/2020 9.6 (L)  12.0 - 15.0 g/dL Final   HCT 12/13/2020 29.7 (L)  36.0 - 46.0 % Final   MCV 12/13/2020 87.6  80.0 - 100.0 fL Final   MCH 12/13/2020 28.3  26.0 - 34.0 pg Final   MCHC 12/13/2020 32.3  30.0 - 36.0 g/dL Final   RDW 12/13/2020  14.4  11.5 - 15.5 % Final   Platelets 12/13/2020 296  150 - 400 K/uL Final   nRBC 12/13/2020 0.0  0.0 - 0.2 % Final   Sodium 12/14/2020 139  135 - 145 mmol/L Final   Potassium 12/14/2020 2.9 (L)  3.5 - 5.1 mmol/L Final   Chloride 12/14/2020 105  98 - 111 mmol/L Final   CO2 12/14/2020 27  22 - 32 mmol/L Final   Glucose, Bld 12/14/2020 107 (H)  70 - 99 mg/dL Final   BUN 12/14/2020 44 (H)  8 - 23 mg/dL Final   Creatinine, Ser 12/14/2020 2.51 (H)  0.44 - 1.00 mg/dL Final   Calcium 12/14/2020 8.7 (L)  8.9 - 10.3 mg/dL Final   GFR, Estimated 12/14/2020 19 (L)  >60 mL/min Final   Anion gap 12/14/2020 7  5 - 15 Final   WBC 12/14/2020 10.0  4.0 - 10.5 K/uL Final   RBC 12/14/2020 2.99 (L)  3.87 - 5.11 MIL/uL Final   Hemoglobin 12/14/2020 8.4 (L)  12.0 - 15.0 g/dL Final   HCT 12/14/2020 26.6 (L)  36.0 - 46.0 % Final  MCV 12/14/2020 89.0  80.0 - 100.0 fL Final   MCH 12/14/2020 28.1  26.0 - 34.0 pg Final   MCHC 12/14/2020 31.6  30.0 - 36.0 g/dL Final   RDW 12/14/2020 14.4  11.5 - 15.5 % Final   Platelets 12/14/2020 302  150 - 400 K/uL Final   nRBC 12/14/2020 0.0  0.0 - 0.2 % Final    Imaging Studies  US RENAL  Result Date: 12/13/2020 CLINICAL DATA:  Pyelonephritis EXAM: RENAL / URINARY TRACT ULTRASOUND COMPLETE COMPARISON:  12/08/2020 FINDINGS: Right Kidney: Renal measurements: 11.0 x 5.9 x 6.0 cm = volume: 201 mL. Mild increased renal cortical echotexture. No hydronephrosis or solid renal mass. Simple appearing 1.1 cm cyst lower pole right kidney. Left Kidney: Renal measurements: 10.3 x 5.3 x 4.4 cm = volume: 124 mL. Mild increased renal cortical echotexture. No hydronephrosis or renal mass. Bladder: Appears normal for degree of bladder distention. Other: None. IMPRESSION: 1. Mild increased bilateral renal cortical echotexture consistent with medical renal disease. Electronically Signed   By: Randa Ngo M.D.   On: 12/13/2020 15:30    Medications   Scheduled Meds:  amLODipine  5 mg Oral Daily    cephALEXin  500 mg Oral Q12H   cholecalciferol  2,000 Units Oral Daily   enoxaparin (LOVENOX) injection  40 mg Subcutaneous Q24H   lisinopril  10 mg Oral Daily   metoprolol tartrate  75 mg Oral BID   potassium chloride  40 mEq Oral BID   QUEtiapine  25 mg Oral QHS   cyanocobalamin  1,000 mcg Oral Daily   No recently discontinued medications to reconcile  LOS: 2 days   Time spent: >67min  Iyari Hagner L Kaiesha Tonner, DO [facility] 12/14/2020, 11:15 AM   Available by Epic secure chat 7AM-7PM. If 7PM-7AM, please contact night-coverage Refer to [email or web address] to contact the [facility] Attending or Consulting provider for this pt

## 2020-12-14 NOTE — Progress Notes (Signed)
Palliative: [name] is lying quietly in bed.  She has known dementia, but will make an somewhat keep eye contact.  She appears acutely/chronically ill and obese.  I believe that she can make her basic needs known.  There is no family at bedside at this time.  Conference with bedside nursing staff related to patient condition and needs.  [employer] rep aware of consult.  Plan: At this point continue to treat the treatable but no CPR or intubation.  Short-term rehab with the goal of returning to her own home with 24/7 care by daughter Rallis.  Outpatient palliative with a [facility] care collective, transitioning to hospice when appropriate.  25 minutes Quinn Axe, NP Palliative medicine team Team phone [phone] Greater than 50% of this time was spent counseling and coordinating care related to the above assessment and plan.

## 2020-12-14 NOTE — Plan of Care (Signed)
No urine output has been charted for her since [place] on 12/12.  We performed a bladder scan which showed only 157 ml. Pt is supposed to d/c to [facility] today.

## 2020-12-14 NOTE — Progress Notes (Signed)
[facility] - Cross coverage  Received message from nursing staff: Pt has refused Lovenox.   I advised RN to document in the chart that patient has refused lovenox.   Dr. Tobie Poet.

## 2020-12-14 NOTE — Progress Notes (Signed)
Patient pulled out PIV that was wrapped in a gauze and stockingette.  Stated she does not want an IV or medication at this time.  MD notified.

## 2020-12-15 ENCOUNTER — Other Ambulatory Visit: Payer: Self-pay | Admitting: Family Medicine

## 2020-12-15 DIAGNOSIS — N184 Chronic kidney disease, stage 4 (severe): Secondary | ICD-10-CM | POA: Diagnosis not present

## 2020-12-15 DIAGNOSIS — R404 Transient alteration of awareness: Secondary | ICD-10-CM | POA: Diagnosis not present

## 2020-12-15 DIAGNOSIS — R778 Other specified abnormalities of plasma proteins: Secondary | ICD-10-CM | POA: Diagnosis not present

## 2020-12-15 DIAGNOSIS — I480 Paroxysmal atrial fibrillation: Secondary | ICD-10-CM | POA: Diagnosis not present

## 2020-12-15 DIAGNOSIS — E119 Type 2 diabetes mellitus without complications: Secondary | ICD-10-CM | POA: Diagnosis not present

## 2020-12-15 DIAGNOSIS — F0393 Unspecified dementia, unspecified severity, with mood disturbance: Secondary | ICD-10-CM | POA: Diagnosis not present

## 2020-12-15 DIAGNOSIS — I4891 Unspecified atrial fibrillation: Secondary | ICD-10-CM | POA: Diagnosis not present

## 2020-12-15 DIAGNOSIS — M25551 Pain in right hip: Secondary | ICD-10-CM | POA: Diagnosis not present

## 2020-12-15 DIAGNOSIS — I5042 Chronic combined systolic (congestive) and diastolic (congestive) heart failure: Secondary | ICD-10-CM | POA: Diagnosis not present

## 2020-12-15 DIAGNOSIS — Z803 Family history of malignant neoplasm of breast: Secondary | ICD-10-CM | POA: Diagnosis not present

## 2020-12-15 DIAGNOSIS — Z8049 Family history of malignant neoplasm of other genital organs: Secondary | ICD-10-CM | POA: Diagnosis not present

## 2020-12-15 DIAGNOSIS — N390 Urinary tract infection, site not specified: Secondary | ICD-10-CM | POA: Diagnosis not present

## 2020-12-15 DIAGNOSIS — N179 Acute kidney failure, unspecified: Secondary | ICD-10-CM

## 2020-12-15 DIAGNOSIS — R143 Flatulence: Secondary | ICD-10-CM | POA: Diagnosis not present

## 2020-12-15 DIAGNOSIS — Z66 Do not resuscitate: Secondary | ICD-10-CM | POA: Diagnosis not present

## 2020-12-15 DIAGNOSIS — Z736 Limitation of activities due to disability: Secondary | ICD-10-CM | POA: Diagnosis not present

## 2020-12-15 DIAGNOSIS — R0902 Hypoxemia: Secondary | ICD-10-CM | POA: Diagnosis not present

## 2020-12-15 DIAGNOSIS — I509 Heart failure, unspecified: Secondary | ICD-10-CM | POA: Diagnosis not present

## 2020-12-15 DIAGNOSIS — I350 Nonrheumatic aortic (valve) stenosis: Secondary | ICD-10-CM | POA: Diagnosis not present

## 2020-12-15 DIAGNOSIS — R634 Abnormal weight loss: Secondary | ICD-10-CM | POA: Diagnosis not present

## 2020-12-15 DIAGNOSIS — R64 Cachexia: Secondary | ICD-10-CM | POA: Diagnosis not present

## 2020-12-15 DIAGNOSIS — C9000 Multiple myeloma not having achieved remission: Secondary | ICD-10-CM | POA: Diagnosis not present

## 2020-12-15 DIAGNOSIS — K5900 Constipation, unspecified: Secondary | ICD-10-CM | POA: Diagnosis not present

## 2020-12-15 DIAGNOSIS — N3000 Acute cystitis without hematuria: Secondary | ICD-10-CM | POA: Diagnosis not present

## 2020-12-15 DIAGNOSIS — R400 Somnolence: Secondary | ICD-10-CM | POA: Diagnosis not present

## 2020-12-15 DIAGNOSIS — R0602 Shortness of breath: Secondary | ICD-10-CM | POA: Diagnosis not present

## 2020-12-15 DIAGNOSIS — J4520 Mild intermittent asthma, uncomplicated: Secondary | ICD-10-CM | POA: Diagnosis not present

## 2020-12-15 DIAGNOSIS — J45909 Unspecified asthma, uncomplicated: Secondary | ICD-10-CM | POA: Diagnosis not present

## 2020-12-15 DIAGNOSIS — Z79899 Other long term (current) drug therapy: Secondary | ICD-10-CM | POA: Diagnosis not present

## 2020-12-15 DIAGNOSIS — E569 Vitamin deficiency, unspecified: Secondary | ICD-10-CM | POA: Diagnosis not present

## 2020-12-15 DIAGNOSIS — R52 Pain, unspecified: Secondary | ICD-10-CM | POA: Diagnosis not present

## 2020-12-15 DIAGNOSIS — E559 Vitamin D deficiency, unspecified: Secondary | ICD-10-CM | POA: Diagnosis not present

## 2020-12-15 DIAGNOSIS — R451 Restlessness and agitation: Secondary | ICD-10-CM | POA: Diagnosis not present

## 2020-12-15 DIAGNOSIS — G4700 Insomnia, unspecified: Secondary | ICD-10-CM

## 2020-12-15 DIAGNOSIS — Z20822 Contact with and (suspected) exposure to covid-19: Secondary | ICD-10-CM | POA: Diagnosis not present

## 2020-12-15 DIAGNOSIS — I5022 Chronic systolic (congestive) heart failure: Secondary | ICD-10-CM | POA: Diagnosis not present

## 2020-12-15 DIAGNOSIS — F03911 Unspecified dementia, unspecified severity, with agitation: Secondary | ICD-10-CM | POA: Diagnosis not present

## 2020-12-15 DIAGNOSIS — R000 Tachycardia, unspecified: Secondary | ICD-10-CM | POA: Diagnosis not present

## 2020-12-15 DIAGNOSIS — I248 Other forms of acute ischemic heart disease: Secondary | ICD-10-CM | POA: Diagnosis not present

## 2020-12-15 DIAGNOSIS — D519 Vitamin B12 deficiency anemia, unspecified: Secondary | ICD-10-CM | POA: Diagnosis not present

## 2020-12-15 DIAGNOSIS — D631 Anemia in chronic kidney disease: Secondary | ICD-10-CM | POA: Diagnosis not present

## 2020-12-15 DIAGNOSIS — Z515 Encounter for palliative care: Secondary | ICD-10-CM | POA: Diagnosis not present

## 2020-12-15 DIAGNOSIS — Z741 Need for assistance with personal care: Secondary | ICD-10-CM | POA: Diagnosis not present

## 2020-12-15 DIAGNOSIS — Z7189 Other specified counseling: Secondary | ICD-10-CM | POA: Diagnosis not present

## 2020-12-15 DIAGNOSIS — F03C0 Unspecified dementia, severe, without behavioral disturbance, psychotic disturbance, mood disturbance, and anxiety: Secondary | ICD-10-CM | POA: Diagnosis not present

## 2020-12-15 DIAGNOSIS — E1122 Type 2 diabetes mellitus with diabetic chronic kidney disease: Secondary | ICD-10-CM | POA: Diagnosis not present

## 2020-12-15 DIAGNOSIS — I428 Other cardiomyopathies: Secondary | ICD-10-CM | POA: Diagnosis not present

## 2020-12-15 DIAGNOSIS — Z7401 Bed confinement status: Secondary | ICD-10-CM | POA: Diagnosis not present

## 2020-12-15 DIAGNOSIS — I272 Pulmonary hypertension, unspecified: Secondary | ICD-10-CM | POA: Diagnosis not present

## 2020-12-15 DIAGNOSIS — I517 Cardiomegaly: Secondary | ICD-10-CM | POA: Diagnosis not present

## 2020-12-15 DIAGNOSIS — I2510 Atherosclerotic heart disease of native coronary artery without angina pectoris: Secondary | ICD-10-CM | POA: Diagnosis not present

## 2020-12-15 DIAGNOSIS — R109 Unspecified abdominal pain: Secondary | ICD-10-CM | POA: Diagnosis not present

## 2020-12-15 DIAGNOSIS — I130 Hypertensive heart and chronic kidney disease with heart failure and stage 1 through stage 4 chronic kidney disease, or unspecified chronic kidney disease: Secondary | ICD-10-CM | POA: Diagnosis not present

## 2020-12-15 DIAGNOSIS — U071 COVID-19: Secondary | ICD-10-CM | POA: Diagnosis not present

## 2020-12-15 DIAGNOSIS — M6259 Muscle wasting and atrophy, not elsewhere classified, multiple sites: Secondary | ICD-10-CM | POA: Diagnosis not present

## 2020-12-15 DIAGNOSIS — J9600 Acute respiratory failure, unspecified whether with hypoxia or hypercapnia: Secondary | ICD-10-CM | POA: Diagnosis not present

## 2020-12-15 DIAGNOSIS — E118 Type 2 diabetes mellitus with unspecified complications: Secondary | ICD-10-CM | POA: Diagnosis not present

## 2020-12-15 DIAGNOSIS — R627 Adult failure to thrive: Secondary | ICD-10-CM | POA: Diagnosis not present

## 2020-12-15 DIAGNOSIS — R410 Disorientation, unspecified: Secondary | ICD-10-CM | POA: Diagnosis not present

## 2020-12-15 DIAGNOSIS — Z7982 Long term (current) use of aspirin: Secondary | ICD-10-CM | POA: Diagnosis not present

## 2020-12-15 DIAGNOSIS — E785 Hyperlipidemia, unspecified: Secondary | ICD-10-CM | POA: Diagnosis not present

## 2020-12-15 DIAGNOSIS — Z743 Need for continuous supervision: Secondary | ICD-10-CM | POA: Diagnosis not present

## 2020-12-15 DIAGNOSIS — R1084 Generalized abdominal pain: Secondary | ICD-10-CM | POA: Diagnosis not present

## 2020-12-15 DIAGNOSIS — F32A Depression, unspecified: Secondary | ICD-10-CM | POA: Diagnosis not present

## 2020-12-15 DIAGNOSIS — I10 Essential (primary) hypertension: Secondary | ICD-10-CM | POA: Diagnosis not present

## 2020-12-15 LAB — BASIC METABOLIC PANEL
Anion gap: 7 (ref 5–15)
BUN: 38 mg/dL — ABNORMAL HIGH (ref 8–23)
CO2: 27 mmol/L (ref 22–32)
Calcium: 9.2 mg/dL (ref 8.9–10.3)
Chloride: 109 mmol/L (ref 98–111)
Creatinine, Ser: 1.73 mg/dL — ABNORMAL HIGH (ref 0.44–1.00)
GFR, Estimated: 30 mL/min — ABNORMAL LOW (ref >60)
Glucose, Bld: 174 mg/dL — ABNORMAL HIGH (ref 70–99)
Potassium: 4.1 mmol/L (ref 3.5–5.1)
Sodium: 143 mmol/L (ref 135–145)

## 2020-12-15 LAB — CBC
HCT: 31.4 % — ABNORMAL LOW (ref 36.0–46.0)
Hemoglobin: 9.8 g/dL — ABNORMAL LOW (ref 12.0–15.0)
MCH: 28 pg (ref 26.0–34.0)
MCHC: 31.2 g/dL (ref 30.0–36.0)
MCV: 89.7 fL (ref 80.0–100.0)
Platelets: 329 10*3/uL (ref 150–400)
RBC: 3.5 MIL/uL — ABNORMAL LOW (ref 3.87–5.11)
RDW: 14.3 % (ref 11.5–15.5)
WBC: 7.5 10*3/uL (ref 4.0–10.5)
nRBC: 0 % (ref 0.0–0.2)

## 2020-12-15 LAB — MAGNESIUM: Magnesium: 2.2 mg/dL (ref 1.7–2.4)

## 2020-12-15 LAB — RESP PANEL BY RT-PCR (FLU A&B, COVID) ARPGX2
Influenza A by PCR: NEGATIVE
Influenza B by PCR: NEGATIVE
SARS Coronavirus 2 by RT PCR: NEGATIVE

## 2020-12-15 MED ORDER — POTASSIUM CHLORIDE 20 MEQ PO PACK
60.0000 meq | PACK | Freq: Two times a day (BID) | ORAL | Status: DC
  Filled 2020-12-15: qty 3

## 2020-12-15 NOTE — Plan of Care (Signed)
Per Report - Patient pulled out x3 IV 12/13.  IV team attempted last night but patient uncooperative so it blew.  Refusing to have another IV as of this morning. Dr. Informed.

## 2020-12-15 NOTE — TOC Transition Note (Signed)
Transition of Care [facility]) - CM/SW Discharge Note   Patient Details  Name: VICKII VOLLAND MRN: [id number] Date of Birth: Jan 16, 1945  Transition [facility] CM/SW Contact:  Kerin Salen, RN Phone Number: 12/15/2020, 12:14 PM   Clinical Narrative: To discharge to [facility] room 610B via [facility]. Nurse to call report.     Final next level of care: Skilled Nursing Facility Barriers to Discharge: Barriers Resolved   Patient Goals and CMS Choice Patient states their goals for this hospitalization and ongoing recovery are:: To SNF CMS Medicare.gov Compare Post Acute Care list provided to:: Patient Represenative (must comment) (Daughter) Choice offered to / list presented to : Adult Children  Discharge Placement              Patient chooses bed at: [facility] [place] (610B) Patient to be transferred to facility by: [place] Name of family member notified: Daughter [place] Patient and family notified of of transfer: 12/15/20  Discharge Plan and Services   Discharge Planning Services: CM Consult Post Acute Care Choice: [place]          DME Arranged: N/A DME Agency: NA         [place] Agency: [place]        Social Determinants of Health (SDOH) Interventions     Readmission Risk Interventions No flowsheet data found.

## 2020-12-15 NOTE — Telephone Encounter (Signed)
Requested medication (s) are due for refill today: no  Requested medication (s) are on the active medication list: yes  Last refill:  11/23/20 #30/1RF  Future visit scheduled: yes  Notes to clinic:  Unable to refill per protocol, cannot delegate.      Requested Prescriptions  Pending Prescriptions Disp Refills   QUEtiapine (SEROQUEL) 25 MG tablet [Pharmacy Med Name: QUETIAPINE FUMARATE 25 MG TAB] 90 tablet 1    Sig: TAKE 1 TABLET BY MOUTH EVERYDAY AT BEDTIME     Not Delegated - Psychiatry:  Antipsychotics - Second Generation (Atypical) - quetiapine Failed - 12/15/2020  1:19 PM      Failed - This refill cannot be delegated      Failed - AST in normal range and within 180 days    AST  Date Value Ref Range Status  12/11/2020 12 (L) 15 - 41 U/L Final          Passed - ALT in normal range and within 180 days    ALT  Date Value Ref Range Status  12/11/2020 6 0 - 44 U/L Final          Passed - Last BP in normal range    BP Readings from Last 1 Encounters:  12/15/20 (!) 154/73          Passed - Valid encounter within last 6 months    Recent Outpatient Visits           3 weeks ago Urinary tract infection without hematuria, site unspecified   [facility] Birdie Sons, MD   1 month ago Localized edema   [facility] Birdie Sons, MD   2 months ago Type 2 diabetes mellitus with stage 4 chronic kidney disease, without long-term current use of insulin ([place])   [facility] Birdie Sons, MD   6 months ago Type 2 diabetes mellitus with stage 4 chronic kidney disease, without long-term current use of insulin ([place])   [facility] Birdie Sons, MD   10 months ago Diabetes mellitus with nephropathy [facility])   [facility] Birdie Sons, MD       Future Appointments             In 3 weeks Fisher, Kirstie Peri, MD [facility], [place]   In 2 months End, Harrell Gave, MD [facility], [place]

## 2020-12-15 NOTE — Progress Notes (Signed)
Responded to consult for IV. Pt moved on first attempt and site blew. Upon further assessment, pt started speaking in a raised voice and pulled stating "No" and pulled arm away. Notified RN. RN stated whe will message MD. Consult cleared.

## 2020-12-15 NOTE — Care Management Important Message (Signed)
Important Message  Patient Details  Name: [name] MRN: [id number] Date of Birth: 1945-07-18   Medicare Important Message Given:  Yes     [name] 12/15/2020, 10:31 AM

## 2020-12-15 NOTE — Discharge Summary (Signed)
Physician Discharge Summary  [name] RFF:[id number] DOB: 03-16-1945 DOA: 12/08/2020  PCP: Birdie Sons, MD  Admit date: 12/08/2020 Discharge date: 12/15/2020  Admitted From: home  Disposition:  SNF   Recommendations for Outpatient Follow-up:  Follow up with PCP in 1-2 weeks   Home Health: no  Equipment/Devices:  Discharge Condition: stable CODE STATUS: DNR Diet recommendation: Heart Healthy / Carb Modified   Brief/Interim Summary: HPI was taken from Dr. Kyung Bacca: [name] is a 75 y.o. female with medical history significant for arctic stenosis, asthma, coronary disease status post cardiac catheterization June March 07, 2016 diabetes mellitus, hypertension, nonischemic cardiomyopathy, who originally presented to the emergency department for frequency of urination and wound check on sixth December 08, 2020 for strong urine order with history of recurrent UTI and status post triple antibiotic treatment over the past several weeks.  Noted over the last several days she has been eating less than yesterday and admitted to have increased frequency of urination and has been somewhat agitated with a baseline dementia .Marland Kitchen  Patient was also noted to be living by herself after her husband died and her daughter brought her in to live with her but has been having difficulty managing her because patient was no longer able to ambulate and was not eating and was difficult to manage overall.  Patient was therefore requesting for SNF transfer but while she was waiting for SNF she developed fever again of 103 UA was positive and is therefore being requested for admission due to the fact that she has had multiple outpatient antibiotic treatment with failures.     ED Course: Patient had been waiting in the emergency department for over 100 hours for transfer to SNF she was then noted to become tachycardic and with a fever temperature was as high as 103.  She had been on Cipro at home which was changed to  Keflex while she was in the ED but she failed oral therapy She was started on ceftriaxone IV   As per Dr. Ouida Sills: [name] is a 75 y.o. female with a PMH significant for asthma, type II DM, CKD 4, HTN, HLD, HFpEF, CAD, internal hemorrhoids, diabetic retinopathy, urinary incontinence, OA, diverticulosis, MM. They presented from relatives home to the ED on 12/08/2020 with anorexia, urinary frequency, increased agitation x several days. In the ED, it was found that they had urinary tract infection with possible pyelonephritis.  She was febrile and tachycardic. They were treated with IV antibiotics and fluid hydration.  Patient was admitted to medicine service for further workup and management of UTI, possible pyelonephritis as outlined in detail below.   12/14/20 -stabile, afebrile   As per Dr. Jimmye Norman 12/15/20: Pt is medically stable for d/c to SNF. For more information, please see previous progress/consult notes.  Discharge Diagnoses:  Active Problems:   Intermittent asthma, uncontrolled   Type 2 diabetes mellitus with stage 4 chronic kidney disease (HCC)   Chronic edema   Essential hypertension   Hyperlipidemia   Intertrigo   Congestive heart failure (HCC)   CAD in native artery   Acute UTI   Ambulatory dysfunction   Pyelonephritis  Acute cystitis: possibly pyelonephritis but not seen on CT. Continue on keflex. Urine cx grew yeast. S/p fluconazole  HTN: continue on metoprolol, amlodipine. Hx of CAD  AKI on CKDIV: Cr is labile.   Dementia: continue w/ supportive care   DM2: well controlled, HbA1c 6.4. Diet controlled   Discharge Instructions  Discharge Instructions  Diet - low sodium heart healthy   Complete by: As directed    Discharge instructions   Complete by: As directed    F/u w/ PCP in 1-2 weeks   Increase activity slowly   Complete by: As directed       Allergies as of 12/15/2020       Reactions   Hydrochlorothiazide Other (See Comments)   Leg  Cramps   Penicillins Rash        Medication List     STOP taking these medications    HYDROcodone-acetaminophen 10-325 MG tablet Commonly known as: NORCO   levofloxacin 250 MG tablet Commonly known as: Levaquin   oxybutynin 5 MG tablet Commonly known as: DITROPAN       TAKE these medications    albuterol 108 (90 Base) MCG/ACT inhaler Commonly known as: VENTOLIN HFA INHALE 1-2 PUFFS INTO THE LUNGS EVERY 6 (SIX) HOURS AS NEEDED FOR SHORTNESS OF BREATH.   amLODipine 5 MG tablet Commonly known as: NORVASC Take 5 mg by mouth daily.   cephALEXin 500 MG capsule Commonly known as: KEFLEX Take 1 capsule (500 mg total) by mouth 3 (three) times daily for 5 days.   cyanocobalamin 1000 MCG tablet Take 1,000 mcg by mouth daily.   donepezil 5 MG tablet Commonly known as: ARICEPT Take 1 tablet by mouth at bedtime.   furosemide 20 MG tablet Commonly known as: LASIX Take 1 tablet (20 mg total) by mouth daily as needed (swelling).   levalbuterol 1.25 MG/3ML nebulizer solution Commonly known as: XOPENEX USE 1 VIAL IN NEBULIZER EVERY 8 HOURS AS NEEDED FOR WHEEZING   lisinopril 10 MG tablet Commonly known as: ZESTRIL TAKE 1 TABLET BY MOUTH EVERY DAY--DOSE INCREASE   metoprolol tartrate 50 MG tablet Commonly known as: LOPRESSOR Take 1.5 tablets (75 mg total) by mouth 2 (two) times daily.   nystatin powder Commonly known as: MYCOSTATIN/NYSTOP Apply 1 application topically 3 (three) times daily. Apply to areas under the breast   QUEtiapine 25 MG tablet Commonly known as: SEROquel Take 1 tablet (25 mg total) by mouth at bedtime.   traZODone 100 MG tablet Commonly known as: DESYREL Take 100 mg by mouth at bedtime as needed.   Vitamin D 50 MCG (2000 UT) tablet Take 2,000 Units by mouth daily.        Contact information for after-discharge care     Destination     [facility] Preferred [facility]F .   Service: Skilled Nursing Contact  information: [place] [phone]                    Allergies  Allergen Reactions   Hydrochlorothiazide Other (See Comments)    Leg Cramps   Penicillins Rash    Consultations:    Procedures/Studies: CT ABDOMEN PELVIS WO CONTRAST  Result Date: 12/08/2020 CLINICAL DATA:  Abdominal pain, acute, nonlocalized; being treated for UTI EXAM: CT ABDOMEN AND PELVIS WITHOUT CONTRAST TECHNIQUE: Multidetector CT imaging of the abdomen and pelvis was performed following the standard protocol without IV contrast. COMPARISON:  2018 FINDINGS: Lower chest: No acute abnormality. Hepatobiliary: No focal liver abnormality is seen. No gallstones, gallbladder wall thickening, or biliary dilatation. Pancreas: Unremarkable. Spleen: Unremarkable. Adrenals/Urinary Tract: Adrenals are unremarkable. Mild nonspecific infiltration of the perinephric fat. No renal calculi. No hydronephrosis. Bladder is unremarkable. Stomach/Bowel: Stomach is within normal limits. Bowel is normal in caliber. Distal colonic diverticulosis. Normal appendix. Vascular/Lymphatic: Aortic atherosclerosis. No enlarged lymph nodes.  Reproductive: Uterus and bilateral adnexa are unremarkable. Other: No ascites.  No significant abdominal wall abnormality. Musculoskeletal: Advanced degenerative changes of the included spine. Degenerative changes at the hips. IMPRESSION: No urinary tract calculi or hydronephrosis. Nonspecific mild perinephric stranding. Limited evaluation for pyelonephritis on this noncontrast study. Colonic diverticulosis.  Atherosclerosis. Electronically Signed   By: Macy Mis M.D.   On: 12/08/2020 12:52   CT HEAD WO CONTRAST (5MM)  Result Date: 12/08/2020 CLINICAL DATA:  75 year old female with history of altered mental status. EXAM: CT HEAD WITHOUT CONTRAST TECHNIQUE: Contiguous axial images were obtained from the base of the skull through the vertex without intravenous contrast.  COMPARISON:  No priors. FINDINGS: Brain: Mild cerebral atrophy. Patchy and confluent areas of decreased attenuation are noted throughout the deep and periventricular white matter of the cerebral hemispheres bilaterally, compatible with chronic microvascular ischemic disease. No evidence of acute infarction, hemorrhage, hydrocephalus, extra-axial collection or mass lesion/mass effect. Vascular: No hyperdense vessel or unexpected calcification. Skull: Normal. Negative for fracture or focal lesion. Sinuses/Orbits: No acute finding. Other: None. IMPRESSION: 1. No acute intracranial abnormalities. 2. Mild cerebral atrophy with severe chronic microvascular ischemic changes in the cerebral white matter, as above. Electronically Signed   By: Vinnie Langton M.D.   On: 12/08/2020 12:44   US RENAL  Result Date: 12/13/2020 CLINICAL DATA:  Pyelonephritis EXAM: RENAL / URINARY TRACT ULTRASOUND COMPLETE COMPARISON:  12/08/2020 FINDINGS: Right Kidney: Renal measurements: 11.0 x 5.9 x 6.0 cm = volume: 201 mL. Mild increased renal cortical echotexture. No hydronephrosis or solid renal mass. Simple appearing 1.1 cm cyst lower pole right kidney. Left Kidney: Renal measurements: 10.3 x 5.3 x 4.4 cm = volume: 124 mL. Mild increased renal cortical echotexture. No hydronephrosis or renal mass. Bladder: Appears normal for degree of bladder distention. Other: None. IMPRESSION: 1. Mild increased bilateral renal cortical echotexture consistent with medical renal disease. Electronically Signed   By: Randa Ngo M.D.   On: 12/13/2020 15:30   DG Chest Port 1 View  Result Date: 12/11/2020 CLINICAL DATA:  Questionable sepsis EXAM: PORTABLE CHEST 1 VIEW COMPARISON:  07/14/2015 FINDINGS: Heart and mediastinal contours are within normal limits. No focal opacities or effusions. No acute bony abnormality. IMPRESSION: No active disease. Electronically Signed   By: Rolm Baptise M.D.   On: 12/11/2020 21:40   (Echo, Carotid, EGD,  Colonoscopy, ERCP)    Subjective: Pt denies any complaints    Discharge Exam: Vitals:   12/15/20 0753 12/15/20 1131  BP: (!) 159/81 130/76  Pulse: 90 91  Resp: 18 18  Temp: 98.3 F (36.8 C) 98.4 F (36.9 C)  SpO2: 99% 100%   Vitals:   12/14/20 1146 12/14/20 1640 12/15/20 0753 12/15/20 1131  BP: (!) 122/58 (!) 146/61 (!) 159/81 130/76  Pulse: 71 81 90 91  Resp: 16 16 18 18   Temp:   98.3 F (36.8 C) 98.4 F (36.9 C)  TempSrc:  Oral Oral Oral  SpO2: 97% 95% 99% 100%  Weight:      Height:        General: Pt is alert, awake, not in acute distress Cardiovascular: S1/S2 +, no rubs, no gallops Respiratory: CTA bilaterally, no wheezing, no rhonchi Abdominal: Soft, NT, ND, bowel sounds + Extremities: no edema, no cyanosis    The results of significant diagnostics from this hospitalization (including imaging, microbiology, ancillary and laboratory) are listed below for reference.     Microbiology: Recent Results (from the past 240 hour(s))  Resp Panel by RT-PCR (Flu  A&B, Covid) Nasopharyngeal Swab     Status: None   Collection Time: 12/08/20 11:19 AM   Specimen: Nasopharyngeal Swab; Nasopharyngeal(NP) swabs in vial transport medium  Result Value Ref Range Status   SARS Coronavirus 2 by RT PCR NEGATIVE NEGATIVE Final    Comment: (NOTE) SARS-CoV-2 target nucleic acids are NOT DETECTED.  The SARS-CoV-2 RNA is generally detectable in upper respiratory specimens during the acute phase of infection. The lowest concentration of SARS-CoV-2 viral copies this assay can detect is 138 copies/mL. A negative result does not preclude SARS-Cov-2 infection and should not be used as the sole basis for treatment or other patient management decisions. A negative result may occur with  improper specimen collection/handling, submission of specimen other than nasopharyngeal swab, presence of viral mutation(s) within the areas targeted by this assay, and inadequate number of  viral copies(<138 copies/mL). A negative result must be combined with clinical observations, patient history, and epidemiological information. The expected result is Negative.  Fact Sheet for Patients:  EntrepreneurPulse.com.au  Fact Sheet for Healthcare Providers:  IncredibleEmployment.be  This test is no t yet approved or cleared by the [place] FDA and  has been authorized for detection and/or diagnosis of SARS-CoV-2 by FDA under an Emergency Use Authorization (EUA). This EUA will remain  in effect (meaning this test can be used) for the duration of the COVID-19 declaration under Section 564(b)(1) of the Act, 21 U.S.C.section 360bbb-3(b)(1), unless the authorization is terminated  or revoked sooner.       Influenza A by PCR NEGATIVE NEGATIVE Final   Influenza B by PCR NEGATIVE NEGATIVE Final    Comment: (NOTE) The Xpert Xpress SARS-CoV-2/FLU/RSV plus assay is intended as an aid in the diagnosis of influenza from Nasopharyngeal swab specimens and should not be used as a sole basis for treatment. Nasal washings and aspirates are unacceptable for Xpert Xpress SARS-CoV-2/FLU/RSV testing.  Fact Sheet for Patients: EntrepreneurPulse.com.au  Fact Sheet for Healthcare Providers: IncredibleEmployment.be  This test is not yet approved or cleared by the [place] FDA and has been authorized for detection and/or diagnosis of SARS-CoV-2 by FDA under an Emergency Use Authorization (EUA). This EUA will remain in effect (meaning this test can be used) for the duration of the COVID-19 declaration under Section 564(b)(1) of the Act, 21 U.S.C. section 360bbb-3(b)(1), unless the authorization is terminated or revoked.  Performed at [facility], [address] [place], [place] [address]   Urine Culture     Status: Abnormal   Collection Time: 12/08/20  1:01 PM   Specimen: Urine, Clean Catch   Result Value Ref Range Status   Specimen Description   Final    URINE, CLEAN CATCH Performed at [facility], [address] [place], [place] [address]    Special Requests   Final    NONE Performed at [facility], [address] [place], [place] [address]    Culture MULTIPLE SPECIES PRESENT, SUGGEST RECOLLECTION (A)  Final   Report Status 12/09/2020 FINAL  Final  Blood Culture (routine x 2)     Status: None (Preliminary result)   Collection Time: 12/11/20 10:04 PM   Specimen: BLOOD  Result Value Ref Range Status   Specimen Description BLOOD BLOOD LEFT FOREARM  Final   Special Requests   Final    BOTTLES DRAWN AEROBIC AND ANAEROBIC Blood Culture adequate volume   Culture   Final    NO GROWTH 4 DAYS Performed at [facility], [place],  [place] [address]    Report Status PENDING  Incomplete  Blood Culture (routine x 2)     Status: None (Preliminary result)   Collection Time: 12/11/20 10:04 PM   Specimen: BLOOD  Result Value Ref Range Status   Specimen Description BLOOD BLOOD RIGHT FOREARM  Final   Special Requests   Final    BOTTLES DRAWN AEROBIC AND ANAEROBIC Blood Culture adequate volume   Culture   Final    NO GROWTH 4 DAYS Performed at [facility], [address], [place], [place] [address]    Report Status PENDING  Incomplete  Resp Panel by RT-PCR (Flu A&B, Covid) Nasopharyngeal Swab     Status: None   Collection Time: 12/12/20  7:04 AM   Specimen: Nasopharyngeal Swab; Nasopharyngeal(NP) swabs in vial transport medium  Result Value Ref Range Status   SARS Coronavirus 2 by RT PCR NEGATIVE NEGATIVE Final    Comment: (NOTE) SARS-CoV-2 target nucleic acids are NOT DETECTED.  The SARS-CoV-2 RNA is generally detectable in upper respiratory specimens during the acute phase of infection. The lowest concentration of SARS-CoV-2 viral copies this assay can detect is 138 copies/mL. A negative result does not preclude  SARS-Cov-2 infection and should not be used as the sole basis for treatment or other patient management decisions. A negative result may occur with  improper specimen collection/handling, submission of specimen other than nasopharyngeal swab, presence of viral mutation(s) within the areas targeted by this assay, and inadequate number of viral copies(<138 copies/mL). A negative result must be combined with clinical observations, patient history, and epidemiological information. The expected result is Negative.  Fact Sheet for Patients:  EntrepreneurPulse.com.au  Fact Sheet for Healthcare Providers:  IncredibleEmployment.be  This test is no t yet approved or cleared by the [place] FDA and  has been authorized for detection and/or diagnosis of SARS-CoV-2 by FDA under an Emergency Use Authorization (EUA). This EUA will remain  in effect (meaning this test can be used) for the duration of the COVID-19 declaration under Section 564(b)(1) of the Act, 21 U.S.C.section 360bbb-3(b)(1), unless the authorization is terminated  or revoked sooner.       Influenza A by PCR NEGATIVE NEGATIVE Final   Influenza B by PCR NEGATIVE NEGATIVE Final    Comment: (NOTE) The Xpert Xpress SARS-CoV-2/FLU/RSV plus assay is intended as an aid in the diagnosis of influenza from Nasopharyngeal swab specimens and should not be used as a sole basis for treatment. Nasal washings and aspirates are unacceptable for Xpert Xpress SARS-CoV-2/FLU/RSV testing.  Fact Sheet for Patients: EntrepreneurPulse.com.au  Fact Sheet for Healthcare Providers: IncredibleEmployment.be  This test is not yet approved or cleared by the [place] FDA and has been authorized for detection and/or diagnosis of SARS-CoV-2 by FDA under an Emergency Use Authorization (EUA). This EUA will remain in effect (meaning this test can be used) for the duration of  the COVID-19 declaration under Section 564(b)(1) of the Act, 21 U.S.C. section 360bbb-3(b)(1), unless the authorization is terminated or revoked.  Performed at [facility], [address] [place], [place] [address]   Urine Culture     Status: Abnormal   Collection Time: 12/12/20  7:30 AM   Specimen: In/Out Cath Urine  Result Value Ref Range Status   Specimen Description   Final    IN/OUT CATH URINE Performed at [facility], [address] [place], [place] [address]    Special Requests   Final    NONE Performed at [facility],  [place], [place] [address]    Culture >=100,000 COLONIES/mL YEAST (A)  Final   Report Status 12/13/2020 FINAL  Final     Labs: BNP (last 3 results) No results for input(s): BNP in the last 8760 hours. Basic Metabolic Panel: Recent Labs  Lab 12/11/20 2203 12/13/20 0739 12/14/20 0536 12/15/20 0548  NA 135 136 139  --   K 3.2* 3.3* 2.9*  --   CL 100 103 105  --   CO2 26 25 27   --   GLUCOSE 171* 125* 107*  --   BUN 18 35* 44*  --   CREATININE 1.38* 2.13* 2.51*  --   CALCIUM 9.0 9.0 8.7*  --   MG  --   --   --  2.2   Liver Function Tests: Recent Labs  Lab 12/11/20 2203  AST 12*  ALT 6  ALKPHOS 64  BILITOT 0.9  PROT 7.6  ALBUMIN 2.4*   No results for input(s): LIPASE, AMYLASE in the last 168 hours. No results for input(s): AMMONIA in the last 168 hours. CBC: Recent Labs  Lab 12/11/20 2203 12/13/20 0739 12/14/20 0536  WBC 9.5 14.1* 10.0  NEUTROABS 7.7  --   --   HGB 9.6* 9.6* 8.4*  HCT 30.3* 29.7* 26.6*  MCV 88.3 87.6 89.0  PLT 275 296 302   Cardiac Enzymes: No results for input(s): CKTOTAL, CKMB, CKMBINDEX, TROPONINI in the last 168 hours. BNP: Invalid input(s): POCBNP CBG: No results for input(s): GLUCAP in the last 168 hours. D-Dimer No results for input(s): DDIMER in the last 72 hours. Hgb A1c No results for input(s): HGBA1C in the last 72 hours. Lipid Profile No  results for input(s): CHOL, HDL, LDLCALC, TRIG, CHOLHDL, LDLDIRECT in the last 72 hours. Thyroid function studies No results for input(s): TSH, T4TOTAL, T3FREE, THYROIDAB in the last 72 hours.  Invalid input(s): FREET3 Anemia work up No results for input(s): VITAMINB12, FOLATE, FERRITIN, TIBC, IRON, RETICCTPCT in the last 72 hours. Urinalysis    Component Value Date/Time   COLORURINE YELLOW (A) 12/12/2020 0720   APPEARANCEUR CLOUDY (A) 12/12/2020 0720   APPEARANCEUR Cloudy (A) 08/25/2020 1142   LABSPEC 1.018 12/12/2020 0720   PHURINE 5.0 12/12/2020 0720   GLUCOSEU 50 (A) 12/12/2020 0720   HGBUR MODERATE (A) 12/12/2020 0720   BILIRUBINUR NEGATIVE 12/12/2020 0720   BILIRUBINUR Negative 08/25/2020 1142   KETONESUR 20 (A) 12/12/2020 0720   PROTEINUR >=300 (A) 12/12/2020 0720   UROBILINOGEN 0.2 01/25/2018 1143   NITRITE NEGATIVE 12/12/2020 0720   LEUKOCYTESUR LARGE (A) 12/12/2020 0720   Sepsis Labs Invalid input(s): PROCALCITONIN,  WBC,  LACTICIDVEN Microbiology Recent Results (from the past 240 hour(s))  Resp Panel by RT-PCR (Flu A&B, Covid) Nasopharyngeal Swab     Status: None   Collection Time: 12/08/20 11:19 AM   Specimen: Nasopharyngeal Swab; Nasopharyngeal(NP) swabs in vial transport medium  Result Value Ref Range Status   SARS Coronavirus 2 by RT PCR NEGATIVE NEGATIVE Final    Comment: (NOTE) SARS-CoV-2 target nucleic acids are NOT DETECTED.  The SARS-CoV-2 RNA is generally detectable in upper respiratory specimens during the acute phase of infection. The lowest concentration of SARS-CoV-2 viral copies this assay can detect is 138 copies/mL. A negative result does not preclude SARS-Cov-2 infection and should not be used as the sole basis for treatment or other patient management decisions. A negative result may occur with  improper specimen collection/handling, submission of specimen other than nasopharyngeal swab, presence of viral mutation(s)  within the areas targeted  by this assay, and inadequate number of viral copies(<138 copies/mL). A negative result must be combined with clinical observations, patient history, and epidemiological information. The expected result is Negative.  Fact Sheet for Patients:  EntrepreneurPulse.com.au  Fact Sheet for Healthcare Providers:  IncredibleEmployment.be  This test is no t yet approved or cleared by the [place] FDA and  has been authorized for detection and/or diagnosis of SARS-CoV-2 by FDA under an Emergency Use Authorization (EUA). This EUA will remain  in effect (meaning this test can be used) for the duration of the COVID-19 declaration under Section 564(b)(1) of the Act, 21 U.S.C.section 360bbb-3(b)(1), unless the authorization is terminated  or revoked sooner.       Influenza A by PCR NEGATIVE NEGATIVE Final   Influenza B by PCR NEGATIVE NEGATIVE Final    Comment: (NOTE) The Xpert Xpress SARS-CoV-2/FLU/RSV plus assay is intended as an aid in the diagnosis of influenza from Nasopharyngeal swab specimens and should not be used as a sole basis for treatment. Nasal washings and aspirates are unacceptable for Xpert Xpress SARS-CoV-2/FLU/RSV testing.  Fact Sheet for Patients: EntrepreneurPulse.com.au  Fact Sheet for Healthcare Providers: IncredibleEmployment.be  This test is not yet approved or cleared by the [place] FDA and has been authorized for detection and/or diagnosis of SARS-CoV-2 by FDA under an Emergency Use Authorization (EUA). This EUA will remain in effect (meaning this test can be used) for the duration of the COVID-19 declaration under Section 564(b)(1) of the Act, 21 U.S.C. section 360bbb-3(b)(1), unless the authorization is terminated or revoked.  Performed at [facility], [address], [place], [place] [address]   Urine Culture     Status: Abnormal   Collection Time: 12/08/20  1:01  PM   Specimen: Urine, Clean Catch  Result Value Ref Range Status   Specimen Description   Final    URINE, CLEAN CATCH Performed at [facility], [address] [place], [place] [address]    Special Requests   Final    NONE Performed at [facility], [address] [place], [place] [address]    Culture MULTIPLE SPECIES PRESENT, SUGGEST RECOLLECTION (A)  Final   Report Status 12/09/2020 FINAL  Final  Blood Culture (routine x 2)     Status: None (Preliminary result)   Collection Time: 12/11/20 10:04 PM   Specimen: BLOOD  Result Value Ref Range Status   Specimen Description BLOOD BLOOD LEFT FOREARM  Final   Special Requests   Final    BOTTLES DRAWN AEROBIC AND ANAEROBIC Blood Culture adequate volume   Culture   Final    NO GROWTH 4 DAYS Performed at [facility], [address], [place], [place] [address]    Report Status PENDING  Incomplete  Blood Culture (routine x 2)     Status: None (Preliminary result)   Collection Time: 12/11/20 10:04 PM   Specimen: BLOOD  Result Value Ref Range Status   Specimen Description BLOOD BLOOD RIGHT FOREARM  Final   Special Requests   Final    BOTTLES DRAWN AEROBIC AND ANAEROBIC Blood Culture adequate volume   Culture   Final    NO GROWTH 4 DAYS Performed at [facility], [address] [place], [place] [address]    Report Status PENDING  Incomplete  Resp Panel by RT-PCR (Flu A&B, Covid) Nasopharyngeal Swab     Status: None   Collection Time: 12/12/20  7:04 AM   Specimen: Nasopharyngeal Swab; Nasopharyngeal(NP)  swabs in vial transport medium  Result Value Ref Range Status   SARS Coronavirus 2 by RT PCR NEGATIVE NEGATIVE Final    Comment: (NOTE) SARS-CoV-2 target nucleic acids are NOT DETECTED.  The SARS-CoV-2 RNA is generally detectable in upper respiratory specimens during the acute phase of infection. The lowest concentration of SARS-CoV-2 viral copies this assay can detect is 138 copies/mL. A  negative result does not preclude SARS-Cov-2 infection and should not be used as the sole basis for treatment or other patient management decisions. A negative result may occur with  improper specimen collection/handling, submission of specimen other than nasopharyngeal swab, presence of viral mutation(s) within the areas targeted by this assay, and inadequate number of viral copies(<138 copies/mL). A negative result must be combined with clinical observations, patient history, and epidemiological information. The expected result is Negative.  Fact Sheet for Patients:  EntrepreneurPulse.com.au  Fact Sheet for Healthcare Providers:  IncredibleEmployment.be  This test is no t yet approved or cleared by the [place] FDA and  has been authorized for detection and/or diagnosis of SARS-CoV-2 by FDA under an Emergency Use Authorization (EUA). This EUA will remain  in effect (meaning this test can be used) for the duration of the COVID-19 declaration under Section 564(b)(1) of the Act, 21 U.S.C.section 360bbb-3(b)(1), unless the authorization is terminated  or revoked sooner.       Influenza A by PCR NEGATIVE NEGATIVE Final   Influenza B by PCR NEGATIVE NEGATIVE Final    Comment: (NOTE) The Xpert Xpress SARS-CoV-2/FLU/RSV plus assay is intended as an aid in the diagnosis of influenza from Nasopharyngeal swab specimens and should not be used as a sole basis for treatment. Nasal washings and aspirates are unacceptable for Xpert Xpress SARS-CoV-2/FLU/RSV testing.  Fact Sheet for Patients: EntrepreneurPulse.com.au  Fact Sheet for Healthcare Providers: IncredibleEmployment.be  This test is not yet approved or cleared by the [place] FDA and has been authorized for detection and/or diagnosis of SARS-CoV-2 by FDA under an Emergency Use Authorization (EUA). This EUA will remain in effect (meaning this test can  be used) for the duration of the COVID-19 declaration under Section 564(b)(1) of the Act, 21 U.S.C. section 360bbb-3(b)(1), unless the authorization is terminated or revoked.  Performed at [facility], [address] [place], [place] [address]   Urine Culture     Status: Abnormal   Collection Time: 12/12/20  7:30 AM   Specimen: In/Out Cath Urine  Result Value Ref Range Status   Specimen Description   Final    IN/OUT CATH URINE Performed at [facility], [address], [place], [place] [address]    Special Requests   Final    NONE Performed at [facility], [address] [place], [place] [address]    Culture >=100,000 COLONIES/mL YEAST (A)  Final   Report Status 12/13/2020 FINAL  Final     Time coordinating discharge: Over 30 minutes  SIGNED:   Wyvonnia Dusky, MD  [facility] 12/15/2020, 12:07 PM Pager   If 7PM-7AM, please contact night-coverage

## 2020-12-15 NOTE — Progress Notes (Signed)
Palliative: [name] is lying in bed pulling at her sheets.  She will briefly make but not keep eye contact.  She has known dementia and has been out of sorts over the last 24 hours.  She denies issues or questions.  I am pretty sure that she can make her basic needs known.  There is no family at bedside at this time.  Conference with bedside nursing staff related to patient condition, needs, goals of care.  Plan: Continue to treat the treatable but no CPR or intubation.  Short-term rehab at [facility] then returning to her own home.  Outpatient palliative with [facility].  Open to hospice care when appropriate.  No charge Quinn Axe, NP Palliative medicine team Team phone [phone] Greater than 50% of this time was spent counseling and coordinating care related to the above assessment and plan.

## 2020-12-16 DIAGNOSIS — I10 Essential (primary) hypertension: Secondary | ICD-10-CM | POA: Diagnosis not present

## 2020-12-16 DIAGNOSIS — N390 Urinary tract infection, site not specified: Secondary | ICD-10-CM | POA: Diagnosis not present

## 2020-12-16 DIAGNOSIS — I509 Heart failure, unspecified: Secondary | ICD-10-CM | POA: Diagnosis not present

## 2020-12-16 DIAGNOSIS — E118 Type 2 diabetes mellitus with unspecified complications: Secondary | ICD-10-CM | POA: Diagnosis not present

## 2020-12-16 DIAGNOSIS — J45909 Unspecified asthma, uncomplicated: Secondary | ICD-10-CM | POA: Diagnosis not present

## 2020-12-16 LAB — CULTURE, BLOOD (ROUTINE X 2)
Culture: NO GROWTH
Culture: NO GROWTH
Special Requests: ADEQUATE
Special Requests: ADEQUATE

## 2020-12-17 DIAGNOSIS — E1122 Type 2 diabetes mellitus with diabetic chronic kidney disease: Secondary | ICD-10-CM | POA: Diagnosis not present

## 2020-12-17 DIAGNOSIS — I5022 Chronic systolic (congestive) heart failure: Secondary | ICD-10-CM | POA: Diagnosis not present

## 2020-12-17 DIAGNOSIS — G4700 Insomnia, unspecified: Secondary | ICD-10-CM | POA: Diagnosis not present

## 2020-12-17 DIAGNOSIS — N184 Chronic kidney disease, stage 4 (severe): Secondary | ICD-10-CM | POA: Diagnosis not present

## 2020-12-17 DIAGNOSIS — E559 Vitamin D deficiency, unspecified: Secondary | ICD-10-CM | POA: Diagnosis not present

## 2020-12-17 DIAGNOSIS — D631 Anemia in chronic kidney disease: Secondary | ICD-10-CM | POA: Diagnosis not present

## 2020-12-17 DIAGNOSIS — J4520 Mild intermittent asthma, uncomplicated: Secondary | ICD-10-CM | POA: Diagnosis not present

## 2020-12-17 DIAGNOSIS — N390 Urinary tract infection, site not specified: Secondary | ICD-10-CM | POA: Diagnosis not present

## 2020-12-17 DIAGNOSIS — I130 Hypertensive heart and chronic kidney disease with heart failure and stage 1 through stage 4 chronic kidney disease, or unspecified chronic kidney disease: Secondary | ICD-10-CM | POA: Diagnosis not present

## 2020-12-17 DIAGNOSIS — E785 Hyperlipidemia, unspecified: Secondary | ICD-10-CM | POA: Diagnosis not present

## 2020-12-20 DIAGNOSIS — R1084 Generalized abdominal pain: Secondary | ICD-10-CM | POA: Diagnosis not present

## 2020-12-20 DIAGNOSIS — I10 Essential (primary) hypertension: Secondary | ICD-10-CM | POA: Diagnosis not present

## 2020-12-20 DIAGNOSIS — N390 Urinary tract infection, site not specified: Secondary | ICD-10-CM | POA: Diagnosis not present

## 2020-12-22 DIAGNOSIS — M25551 Pain in right hip: Secondary | ICD-10-CM | POA: Diagnosis not present

## 2020-12-22 DIAGNOSIS — R634 Abnormal weight loss: Secondary | ICD-10-CM | POA: Diagnosis not present

## 2020-12-22 DIAGNOSIS — F32A Depression, unspecified: Secondary | ICD-10-CM | POA: Diagnosis not present

## 2020-12-22 DIAGNOSIS — K5900 Constipation, unspecified: Secondary | ICD-10-CM | POA: Diagnosis not present

## 2020-12-27 ENCOUNTER — Emergency Department: Payer: Medicare Other

## 2020-12-27 ENCOUNTER — Other Ambulatory Visit: Payer: Self-pay

## 2020-12-27 ENCOUNTER — Inpatient Hospital Stay
Admission: EM | Admit: 2020-12-27 | Discharge: 2021-01-07 | DRG: 308 | Disposition: A | Discharge status: Hospice | Payer: Medicare Other | Source: Skilled Nursing Facility | Attending: Internal Medicine | Admitting: Internal Medicine

## 2020-12-27 DIAGNOSIS — I248 Other forms of acute ischemic heart disease: Secondary | ICD-10-CM | POA: Diagnosis not present

## 2020-12-27 DIAGNOSIS — R627 Adult failure to thrive: Secondary | ICD-10-CM | POA: Diagnosis present

## 2020-12-27 DIAGNOSIS — Z79899 Other long term (current) drug therapy: Secondary | ICD-10-CM

## 2020-12-27 DIAGNOSIS — U071 COVID-19: Secondary | ICD-10-CM | POA: Diagnosis present

## 2020-12-27 DIAGNOSIS — F0393 Unspecified dementia, unspecified severity, with mood disturbance: Secondary | ICD-10-CM | POA: Diagnosis present

## 2020-12-27 DIAGNOSIS — C9000 Multiple myeloma not having achieved remission: Secondary | ICD-10-CM | POA: Diagnosis present

## 2020-12-27 DIAGNOSIS — I130 Hypertensive heart and chronic kidney disease with heart failure and stage 1 through stage 4 chronic kidney disease, or unspecified chronic kidney disease: Secondary | ICD-10-CM | POA: Diagnosis present

## 2020-12-27 DIAGNOSIS — Z743 Need for continuous supervision: Secondary | ICD-10-CM | POA: Diagnosis not present

## 2020-12-27 DIAGNOSIS — I517 Cardiomegaly: Secondary | ICD-10-CM | POA: Diagnosis not present

## 2020-12-27 DIAGNOSIS — I4891 Unspecified atrial fibrillation: Secondary | ICD-10-CM | POA: Diagnosis present

## 2020-12-27 DIAGNOSIS — I10 Essential (primary) hypertension: Secondary | ICD-10-CM | POA: Diagnosis not present

## 2020-12-27 DIAGNOSIS — I428 Other cardiomyopathies: Secondary | ICD-10-CM | POA: Diagnosis not present

## 2020-12-27 DIAGNOSIS — N184 Chronic kidney disease, stage 4 (severe): Secondary | ICD-10-CM | POA: Diagnosis present

## 2020-12-27 DIAGNOSIS — I272 Pulmonary hypertension, unspecified: Secondary | ICD-10-CM | POA: Diagnosis not present

## 2020-12-27 DIAGNOSIS — F03C0 Unspecified dementia, severe, without behavioral disturbance, psychotic disturbance, mood disturbance, and anxiety: Secondary | ICD-10-CM | POA: Diagnosis not present

## 2020-12-27 DIAGNOSIS — I350 Nonrheumatic aortic (valve) stenosis: Secondary | ICD-10-CM | POA: Diagnosis not present

## 2020-12-27 DIAGNOSIS — Z9114 Patient's other noncompliance with medication regimen: Secondary | ICD-10-CM

## 2020-12-27 DIAGNOSIS — Z8249 Family history of ischemic heart disease and other diseases of the circulatory system: Secondary | ICD-10-CM

## 2020-12-27 DIAGNOSIS — E785 Hyperlipidemia, unspecified: Secondary | ICD-10-CM | POA: Diagnosis not present

## 2020-12-27 DIAGNOSIS — Z803 Family history of malignant neoplasm of breast: Secondary | ICD-10-CM

## 2020-12-27 DIAGNOSIS — F03911 Unspecified dementia, unspecified severity, with agitation: Secondary | ICD-10-CM | POA: Diagnosis not present

## 2020-12-27 DIAGNOSIS — J4520 Mild intermittent asthma, uncomplicated: Secondary | ICD-10-CM

## 2020-12-27 DIAGNOSIS — R778 Other specified abnormalities of plasma proteins: Secondary | ICD-10-CM

## 2020-12-27 DIAGNOSIS — I5042 Chronic combined systolic (congestive) and diastolic (congestive) heart failure: Secondary | ICD-10-CM | POA: Diagnosis not present

## 2020-12-27 DIAGNOSIS — Z7982 Long term (current) use of aspirin: Secondary | ICD-10-CM | POA: Diagnosis not present

## 2020-12-27 DIAGNOSIS — Z515 Encounter for palliative care: Secondary | ICD-10-CM | POA: Diagnosis not present

## 2020-12-27 DIAGNOSIS — R0602 Shortness of breath: Secondary | ICD-10-CM | POA: Diagnosis not present

## 2020-12-27 DIAGNOSIS — R0902 Hypoxemia: Secondary | ICD-10-CM | POA: Diagnosis not present

## 2020-12-27 DIAGNOSIS — E1122 Type 2 diabetes mellitus with diabetic chronic kidney disease: Secondary | ICD-10-CM | POA: Diagnosis not present

## 2020-12-27 DIAGNOSIS — J45909 Unspecified asthma, uncomplicated: Secondary | ICD-10-CM | POA: Diagnosis present

## 2020-12-27 DIAGNOSIS — Z66 Do not resuscitate: Secondary | ICD-10-CM | POA: Diagnosis not present

## 2020-12-27 DIAGNOSIS — F0390 Unspecified dementia without behavioral disturbance: Secondary | ICD-10-CM | POA: Diagnosis present

## 2020-12-27 DIAGNOSIS — Z20822 Contact with and (suspected) exposure to covid-19: Secondary | ICD-10-CM

## 2020-12-27 DIAGNOSIS — Z993 Dependence on wheelchair: Secondary | ICD-10-CM

## 2020-12-27 DIAGNOSIS — I5022 Chronic systolic (congestive) heart failure: Secondary | ICD-10-CM | POA: Diagnosis present

## 2020-12-27 DIAGNOSIS — I480 Paroxysmal atrial fibrillation: Principal | ICD-10-CM | POA: Diagnosis present

## 2020-12-27 DIAGNOSIS — F32A Depression, unspecified: Secondary | ICD-10-CM | POA: Diagnosis present

## 2020-12-27 DIAGNOSIS — R410 Disorientation, unspecified: Secondary | ICD-10-CM | POA: Diagnosis not present

## 2020-12-27 DIAGNOSIS — I2510 Atherosclerotic heart disease of native coronary artery without angina pectoris: Secondary | ICD-10-CM | POA: Diagnosis present

## 2020-12-27 DIAGNOSIS — J9600 Acute respiratory failure, unspecified whether with hypoxia or hypercapnia: Secondary | ICD-10-CM | POA: Diagnosis not present

## 2020-12-27 DIAGNOSIS — Z7189 Other specified counseling: Secondary | ICD-10-CM | POA: Diagnosis not present

## 2020-12-27 DIAGNOSIS — Z833 Family history of diabetes mellitus: Secondary | ICD-10-CM

## 2020-12-27 DIAGNOSIS — R400 Somnolence: Secondary | ICD-10-CM | POA: Diagnosis not present

## 2020-12-27 DIAGNOSIS — R64 Cachexia: Secondary | ICD-10-CM

## 2020-12-27 DIAGNOSIS — R404 Transient alteration of awareness: Secondary | ICD-10-CM | POA: Diagnosis not present

## 2020-12-27 DIAGNOSIS — Z8049 Family history of malignant neoplasm of other genital organs: Secondary | ICD-10-CM

## 2020-12-27 DIAGNOSIS — R000 Tachycardia, unspecified: Secondary | ICD-10-CM | POA: Diagnosis not present

## 2020-12-27 LAB — HEPATIC FUNCTION PANEL
ALT: 9 U/L (ref 0–44)
AST: 19 U/L (ref 15–41)
Albumin: 2.4 g/dL — ABNORMAL LOW (ref 3.5–5.0)
Alkaline Phosphatase: 64 U/L (ref 38–126)
Bilirubin, Direct: <0.1 mg/dL (ref 0.0–0.2)
Total Bilirubin: 0.3 mg/dL (ref 0.3–1.2)
Total Protein: 7.6 g/dL (ref 6.5–8.1)

## 2020-12-27 LAB — BASIC METABOLIC PANEL
Anion gap: 6 (ref 5–15)
BUN: 26 mg/dL — ABNORMAL HIGH (ref 8–23)
CO2: 26 mmol/L (ref 22–32)
Calcium: 9.1 mg/dL (ref 8.9–10.3)
Chloride: 106 mmol/L (ref 98–111)
Creatinine, Ser: 1.89 mg/dL — ABNORMAL HIGH (ref 0.44–1.00)
GFR, Estimated: 27 mL/min — ABNORMAL LOW (ref >60)
Glucose, Bld: 168 mg/dL — ABNORMAL HIGH (ref 70–99)
Potassium: 3.6 mmol/L (ref 3.5–5.1)
Sodium: 138 mmol/L (ref 135–145)

## 2020-12-27 LAB — CBC
HCT: 33.2 % — ABNORMAL LOW (ref 36.0–46.0)
Hemoglobin: 10.3 g/dL — ABNORMAL LOW (ref 12.0–15.0)
MCH: 28.4 pg (ref 26.0–34.0)
MCHC: 31 g/dL (ref 30.0–36.0)
MCV: 91.5 fL (ref 80.0–100.0)
Platelets: 331 10*3/uL (ref 150–400)
RBC: 3.63 MIL/uL — ABNORMAL LOW (ref 3.87–5.11)
RDW: 14.1 % (ref 11.5–15.5)
WBC: 8.9 10*3/uL (ref 4.0–10.5)
nRBC: 0 % (ref 0.0–0.2)

## 2020-12-27 LAB — BRAIN NATRIURETIC PEPTIDE: B Natriuretic Peptide: 128.9 pg/mL — ABNORMAL HIGH (ref 0.0–100.0)

## 2020-12-27 LAB — TROPONIN I (HIGH SENSITIVITY): Troponin I (High Sensitivity): 70 ng/L — ABNORMAL HIGH (ref <18)

## 2020-12-27 LAB — RESP PANEL BY RT-PCR (FLU A&B, COVID) ARPGX2
Influenza A by PCR: NEGATIVE
Influenza B by PCR: NEGATIVE
SARS Coronavirus 2 by RT PCR: POSITIVE — AB

## 2020-12-27 LAB — PROCALCITONIN: Procalcitonin: <0.1 ng/mL

## 2020-12-27 MED ORDER — ONDANSETRON HCL 4 MG/2ML IJ SOLN: 4.0000 mg | Freq: Three times a day (TID) | INTRAMUSCULAR | Status: DC | PRN

## 2020-12-27 MED ORDER — VITAMIN B-12 1000 MCG PO TABS
1000.0000 ug | ORAL_TABLET | Freq: Every day | ORAL | Status: DC
  Filled 2020-12-27 (×4): qty 1

## 2020-12-27 MED ORDER — HEPARIN SODIUM (PORCINE) 5000 UNIT/ML IJ SOLN: 5000.0000 [IU] | Freq: Three times a day (TID) | INTRAMUSCULAR | Status: DC

## 2020-12-27 MED ORDER — VITAMIN D 25 MCG (1000 UNIT) PO TABS
2000.0000 [IU] | ORAL_TABLET | Freq: Every day | ORAL | Status: DC
  Filled 2020-12-27: qty 2

## 2020-12-27 MED ORDER — ALBUTEROL SULFATE HFA 108 (90 BASE) MCG/ACT IN AERS
2.0000 | INHALATION_SPRAY | RESPIRATORY_TRACT | Status: DC | PRN
  Filled 2020-12-27: qty 6.7

## 2020-12-27 MED ORDER — HYDRALAZINE HCL 20 MG/ML IJ SOLN: 5.0000 mg | INTRAMUSCULAR | Status: DC | PRN

## 2020-12-27 MED ORDER — HEPARIN (PORCINE) 25000 UT/250ML-% IV SOLN
1000.0000 [IU]/h | INTRAVENOUS | Status: DC
  Administered 2020-12-28: 22:00:00 1000 [IU]/h via INTRAVENOUS
  Filled 2020-12-27: qty 250

## 2020-12-27 MED ORDER — SENNOSIDES-DOCUSATE SODIUM 8.6-50 MG PO TABS
1.0000 | ORAL_TABLET | Freq: Two times a day (BID) | ORAL | Status: DC
  Filled 2020-12-27: qty 1

## 2020-12-27 MED ORDER — ACETAMINOPHEN 325 MG PO TABS: 650.0000 mg | ORAL_TABLET | Freq: Four times a day (QID) | ORAL | Status: DC | PRN

## 2020-12-27 MED ORDER — TRAZODONE HCL 100 MG PO TABS: 100.0000 mg | ORAL_TABLET | Freq: Every evening | ORAL | Status: DC | PRN

## 2020-12-27 MED ORDER — DONEPEZIL HCL 5 MG PO TABS
5.0000 mg | ORAL_TABLET | Freq: Every day | ORAL | Status: DC
  Filled 2020-12-27 (×3): qty 1

## 2020-12-27 MED ORDER — HEPARIN BOLUS VIA INFUSION
3400.0000 [IU] | Freq: Once | INTRAVENOUS | Status: DC
  Filled 2020-12-27: qty 3400

## 2020-12-27 MED ORDER — DM-GUAIFENESIN ER 30-600 MG PO TB12: 1.0000 | ORAL_TABLET | Freq: Two times a day (BID) | ORAL | Status: DC | PRN

## 2020-12-27 MED ORDER — NYSTATIN 100000 UNIT/GM EX POWD
1.0000 "application " | Freq: Three times a day (TID) | CUTANEOUS | Status: DC
  Filled 2020-12-27: qty 15

## 2020-12-27 MED ORDER — METOPROLOL TARTRATE 50 MG PO TABS
75.0000 mg | ORAL_TABLET | Freq: Two times a day (BID) | ORAL | Status: DC
  Administered 2020-12-29: 11:00:00 75 mg via ORAL
  Filled 2020-12-27 (×3): qty 1

## 2020-12-27 MED ORDER — QUETIAPINE FUMARATE 25 MG PO TABS
25.0000 mg | ORAL_TABLET | Freq: Every day | ORAL | Status: DC
  Filled 2020-12-27: qty 1

## 2020-12-27 MED ORDER — ASPIRIN EC 81 MG PO TBEC
81.0000 mg | DELAYED_RELEASE_TABLET | Freq: Every day | ORAL | Status: DC
  Filled 2020-12-27: qty 1

## 2020-12-27 MED ORDER — MIRTAZAPINE 15 MG PO TABS
7.5000 mg | ORAL_TABLET | Freq: Every day | ORAL | Status: DC
  Filled 2020-12-27: qty 1

## 2020-12-27 NOTE — Progress Notes (Signed)
[place] for heparin infusion Indication: atrial fibrillation  Allergies  Allergen Reactions   Hydrochlorothiazide Other (See Comments)    Leg Cramps   Penicillins Rash    Patient Measurements: Height: 5' (152.4 cm) Weight: 90.7 kg (200 lb) IBW/kg (Calculated) : 45.5 Heparin Dosing Weight: 67 kg  Vital Signs: Temp: 99.1 F (37.3 C) (12/26 1307) Temp Source: Oral (12/26 1307) BP: 131/59 (12/26 1415) Pulse Rate: 123 (12/26 1406)  Labs: Recent Labs    12/27/20 1313  HGB 10.3*  HCT 33.2*  PLT 331  CREATININE 1.89*  TROPONINIHS 70*    Estimated Creatinine Clearance: 25.4 mL/min (A) (by C-G formula based on SCr of 1.89 mg/dL (H)).   Medical History: Past Medical History:  Diagnosis Date   Aortic stenosis    Asthma    Coronary artery disease    Diabetes mellitus without complication (HCC)    History of measles    Hypertension    Nonischemic cardiomyopathy ([place])    Pulmonary hypertension ([place])     Medications:  Per chart review, no anticoagulation prior to admission  Assessment: 75 yo female who presented to ED with shortness of breath. Pharmacy has been consulted for heparin dosing for afib.   Baseline labs: Hgb 10.3, HCT 33.2, Plt wnl; aPTT, PT-INR pending  Goal of Therapy:  Heparin level 0.3-0.7 units/ml Monitor platelets by anticoagulation protocol: Yes   Plan:  Give 3400 units bolus x 1 Start heparin infusion at 1000 units/hr Check anti-Xa level in 8 hours and daily while on heparin Continue to monitor H&H and platelets  Tamikka Pilger O Conal Shetley 12/27/2020,4:22 PM

## 2020-12-27 NOTE — ED Notes (Signed)
Pt refusing covid swab. MD aware of patients refusals.

## 2020-12-27 NOTE — ED Triage Notes (Signed)
Presents from [employer]   Per EMS she is COVID positive  had low grade temp and low o2 sats  pt has hx of being combative

## 2020-12-27 NOTE — ED Notes (Signed)
Pt again refusing lab draw or IV stick.

## 2020-12-27 NOTE — ED Notes (Addendum)
Patient alert, resp even, unlabored on RA. Denies pain. When asked questions regarding orientation patient states "Why?" Patient informed RN is performing assessment. Patient states name and that it is the year 2012. Patient refuses to allow RN to perform physical assessment. Patient refuses to wear heart monitor. Refuses IV for heparin gtt. Patient informed that she has scheduled oral medications tonight. Patient refuses to take medications. Declined need for additional blankets.

## 2020-12-27 NOTE — H&P (Signed)
History and Physical    [name] [id number] DOB: 18-Aug-1945 DOA: 12/27/2020  Referring MD/NP/PA:   PCP: Birdie Sons, MD   Patient coming from:  The patient is coming from SNF Chief Complaint: SOB, failure to thrive  HPI: [name] is a 75 y.o. female with medical history significant of CKD-IV, hypertension, hyperlipidemia, diet-controlled diabetes, asthma, depression, pulmonary hypertension, CAD, aortic stenosis, dCHF, multiple myeloma, dementia, depression, who presents with shortness of breath.  Per her daughter at the bedside, patient has been declining in the past 2 weeks.  Patient has generalized weakness, poor appetite, decreased oral intake.  Patient was tested positive for COVID this morning.  Patient has shortness breath, dry cough, denies chest pain.  Patient has some mild fever, no chills.  No nausea, vomiting, diarrhea or abdominal pain.  Patient recently finished course of Keflex for UTI.  Patient states that she has dysuria and burning on urination sometimes.  No hematuria.  Patient was found to have hypotension with blood pressure 80/38 initially, which improved to 131/59, without giving IV fluid in ED. She was found to have new onset atrial fibrillation with RVR, heart rate up to 128.  ED Course: pt was found to have pending COVID-19 PCR, troponin level 70, WBC 8.9, renal function close to baseline, temperature 99.1, RR 18, chest x-ray showed cardiomegaly and low lung volume without effusion.  Patient is placed on progressive bed for observation.  Message sent to Dr. Percival Spanish of cardiology for consult.  Review of Systems:   General: has fevers, no chills, no body weight gain, has poor appetite, has fatigue HEENT: no blurry vision, hearing changes or sore throat Respiratory: has dyspnea, coughing, no wheezing CV: no chest pain, no palpitations GI: no nausea, vomiting, abdominal pain, diarrhea, constipation GU: has dysuria, burning on urination, no  increased urinary frequency, hematuria  Ext: no leg edema Neuro: no unilateral weakness, numbness, or tingling, no vision change or hearing loss Skin: no rash, no skin tear. MSK: No muscle spasm, no deformity, no limitation of range of movement in spin Heme: No easy bruising.  Travel history: No recent long distant travel.  Allergy:  Allergies  Allergen Reactions   Hydrochlorothiazide Other (See Comments)    Leg Cramps   Penicillins Rash    Past Medical History:  Diagnosis Date   Aortic stenosis    Asthma    Coronary artery disease    Diabetes mellitus without complication ([place])    History of measles    Hypertension    Nonischemic cardiomyopathy ([place])    Pulmonary hypertension ([place])     Past Surgical History:  Procedure Laterality Date   CARDIAC CATHETERIZATION     Cyst(solitary) of breast:removed     RIGHT/LEFT HEART CATH AND CORONARY ANGIOGRAPHY N/A 03/07/2016   Procedure: Right/Left Heart Cath and Coronary Angiography;  Surgeon: Nelva Bush, MD;  Location: [facility];  Service: Cardiovascular;  Laterality: N/A;   TUBAL LIGATION      Social History:  reports that she has never smoked. She has never used smokeless tobacco. She reports that she does not drink alcohol and does not use drugs.  Family History:  Family History  Problem Relation Age of Onset   Cancer Mother        Uterine cancer   Diabetes Mother    Heart disease Mother    Heart failure Mother    Parkinson's disease Father    Cancer Sister  breast cancer   Hypertension Sister    Diabetes Sister        Non-insulin dependent Diabetes Mellitus   Diabetes Brother        Non-insulin Dependent Diabetes Mellitus   Cancer Brother    Mesothelioma Brother      Prior to Admission medications   Medication Sig Start Date End Date Taking? Authorizing Provider  albuterol (VENTOLIN HFA) 108 (90 Base) MCG/ACT inhaler INHALE 1-2 PUFFS INTO THE LUNGS EVERY 6 (SIX) HOURS AS NEEDED FOR SHORTNESS  OF BREATH. 04/23/20   Birdie Sons, MD  amLODipine (NORVASC) 5 MG tablet Take 5 mg by mouth daily.    [provider]  Cholecalciferol (VITAMIN D) 50 MCG (2000 UT) tablet Take 2,000 Units by mouth daily.    [provider]  cyanocobalamin 1000 MCG tablet Take 1,000 mcg by mouth daily.    [provider]  donepezil (ARICEPT) 5 MG tablet Take 1 tablet by mouth at bedtime. 11/04/20 11/04/21  [provider]  furosemide (LASIX) 20 MG tablet TAKE 1 TABLET (20 MG TOTAL) BY MOUTH DAILY AS NEEDED FOR FLUID. 12/17/20   Birdie Sons, MD  levalbuterol (XOPENEX) 1.25 MG/3ML nebulizer solution USE 1 VIAL IN NEBULIZER EVERY 8 HOURS AS NEEDED FOR WHEEZING 05/14/20   Birdie Sons, MD  lisinopril (ZESTRIL) 10 MG tablet TAKE 1 TABLET BY MOUTH EVERY DAY--DOSE INCREASE 08/31/20   End, Harrell Gave, MD  metoprolol tartrate (LOPRESSOR) 50 MG tablet Take 1.5 tablets (75 mg total) by mouth 2 (two) times daily. 11/17/20   End, Harrell Gave, MD  nystatin (MYCOSTATIN/NYSTOP) powder Apply 1 application topically 3 (three) times daily. Apply to areas under the breast 12/08/20   Rada Hay, MD  oxybutynin (DITROPAN) 5 MG tablet Take 5 mg by mouth 2 (two) times daily. 12/20/20   [provider]  QUEtiapine (SEROQUEL) 25 MG tablet TAKE 1 TABLET BY MOUTH EVERYDAY AT BEDTIME 12/17/20   Birdie Sons, MD  traZODone (DESYREL) 100 MG tablet Take 100 mg by mouth at bedtime as needed.    [provider]    Physical Exam: Vitals:   12/27/20 1406 12/27/20 1415 12/27/20 1645 12/27/20 1700  BP:  (!) 131/59 120/70 113/63  Pulse: (!) 123  (!) 107   Resp:   19   Temp:      TempSrc:      SpO2: 97%  95%   Weight:      Height:       General: Not in acute distress HEENT:       Eyes: PERRL, EOMI, no scleral icterus.       ENT: No discharge from the ears and nose, no pharynx injection, no tonsillar enlargement.        Neck: No JVD, no bruit, no mass felt. Heme: No  neck lymph node enlargement. Cardiac: S1/S2, RRR, has 2/6 systolic murmurs, No gallops or rubs. Respiratory: No rales, wheezing, rhonchi or rubs. GI: Soft, nondistended, nontender, no rebound pain, no organomegaly, BS present. GU: No hematuria Ext: No pitting leg edema bilaterally. 1+DP/PT pulse bilaterally. Musculoskeletal: No joint deformities, No joint redness or warmth, no limitation of ROM in spin. Skin: No rashes.  Neuro: Alert, oriented X3, cranial nerves II-XII grossly intact, moves all extremities normally. Psych: Patient is not psychotic, no suicidal or hemocidal ideation.  Labs on Admission: I have personally reviewed following labs and imaging studies  CBC: Recent Labs  Lab 12/27/20 1313  WBC 8.9  HGB 10.3*  HCT  33.2*  MCV 91.5  PLT 892   Basic Metabolic Panel: Recent Labs  Lab 12/27/20 1313  NA 138  K 3.6  CL 106  CO2 26  GLUCOSE 168*  BUN 26*  CREATININE 1.89*  CALCIUM 9.1   GFR: Estimated Creatinine Clearance: 25.4 mL/min (A) (by C-G formula based on SCr of 1.89 mg/dL (H)). Liver Function Tests: Recent Labs  Lab 12/27/20 1313  AST 19  ALT 9  ALKPHOS 64  BILITOT 0.3  PROT 7.6  ALBUMIN 2.4*   No results for input(s): LIPASE, AMYLASE in the last 168 hours. No results for input(s): AMMONIA in the last 168 hours. Coagulation Profile: No results for input(s): INR, PROTIME in the last 168 hours. Cardiac Enzymes: No results for input(s): CKTOTAL, CKMB, CKMBINDEX, TROPONINI in the last 168 hours. BNP (last 3 results) No results for input(s): PROBNP in the last 8760 hours. HbA1C: No results for input(s): HGBA1C in the last 72 hours. CBG: No results for input(s): GLUCAP in the last 168 hours. Lipid Profile: No results for input(s): CHOL, HDL, LDLCALC, TRIG, CHOLHDL, LDLDIRECT in the last 72 hours. Thyroid Function Tests: No results for input(s): TSH, T4TOTAL, FREET4, T3FREE, THYROIDAB in the last 72 hours. Anemia Panel: No results for input(s):  VITAMINB12, FOLATE, FERRITIN, TIBC, IRON, RETICCTPCT in the last 72 hours. Urine analysis:    Component Value Date/Time   COLORURINE YELLOW (A) 12/12/2020 0720   APPEARANCEUR CLOUDY (A) 12/12/2020 0720   APPEARANCEUR Cloudy (A) 08/25/2020 1142   LABSPEC 1.018 12/12/2020 0720   PHURINE 5.0 12/12/2020 0720   GLUCOSEU 50 (A) 12/12/2020 0720   HGBUR MODERATE (A) 12/12/2020 0720   BILIRUBINUR NEGATIVE 12/12/2020 0720   BILIRUBINUR Negative 08/25/2020 1142   KETONESUR 20 (A) 12/12/2020 0720   PROTEINUR >=300 (A) 12/12/2020 0720   UROBILINOGEN 0.2 01/25/2018 1143   NITRITE NEGATIVE 12/12/2020 0720   LEUKOCYTESUR LARGE (A) 12/12/2020 0720   Sepsis Labs: @LABRCNTIP (procalcitonin:4,lacticidven:4) )No results found for this or any previous visit (from the past 240 hour(s)).   Radiological Exams on Admission: DG Chest 2 View  Result Date: 12/27/2020 CLINICAL DATA:  COVID-19 positive with decreased oxygen saturation. EXAM: CHEST - 2 VIEW COMPARISON:  12/11/2020 FINDINGS: Lateral view degraded by patient arm position. Midline trachea. Mild cardiomegaly. Atherosclerosis in the transverse aorta. Right paratracheal soft tissue fullness has been grossly similar back to 2017 and is likely due to prominent great vessels. No pleural effusion or pneumothorax. No congestive failure. Low lung volumes with resultant pulmonary interstitial prominence. IMPRESSION: Cardiomegaly and low lung volumes, without acute disease. Electronically Signed   By: Abigail Miyamoto M.D.   On: 12/27/2020 13:58     EKG: I have personally reviewed. A fib, QTC 438, T wave inversions/ST depression in V3-V6   Assessment/Plan Principal Problem:   Atrial fibrillation with RVR (HCC) Active Problems:   Type 2 diabetes mellitus with stage 4 chronic kidney disease (HCC)   Essential hypertension   Multiple myeloma not having achieved remission (HCC)   Chronic HFrEF (heart failure with reduced ejection fraction) (HCC)   CKD (chronic  kidney disease), stage IV (HCC)   Suspected COVID-19 virus infection   Elevated troponin   Failure to thrive in adult   CAD (coronary artery disease)   Asthma   Dementia ([place])   Depression   New onset atrial fibrillation with RVR: HR is up to 128.  -place in progressive bed for observation -Start IV heparin which is also for elevated troponin -Continue home metoprolol 75 mg  twice daily -Check a TSH -Message sent to Dr. Percival Spanish of cardiology for consultation  Elevated troponin and history of CAD: Troponin 70.  No chest pain.  Possibly demand ischemia -Trend troponin -Aspirin 81 mg daily -Check A1c, FLP -On IV heparin  Diet controlled type 2 diabetes mellitus with stage 4 chronic kidney disease ([place]): Recent A1c 6.1.  Well-controlled.  Patient not taking medications at home.  Blood sugar 168. -Check CBG every morning  Essential hypertension: -IV hydralazine as needed -Hold lisinopril, amlodipine and Lasix due to hypotension  Multiple myeloma not having achieved remission (HCC) -Follow-up with oncology  Chronic HFrEF (heart failure with reduced ejection fraction) ([place]): Recent 2D echo on 04/18/2019 showed EF of 60 to 65% with grade 1 diastolic dysfunction.  Patient does not have leg edema.  Does not seem to have CHF exacerbation -Hold Lasix due to hypotension -Check BNP  CKD (chronic kidney disease), stage IV ([place]): Close to baseline.  Recent baseline creatinine 1.4-2.0.  Her creatinine is 1.89, BUN 26 -Follow-up with BMP  Failure to thrive in adult -PT/OT  Asthma: stable -Bronchodilators  Dementia (HCC) -Donepezil  Depression -Continue home medications: Mirtazapine, Seroquel  Suspected COVID-19 virus infection: Her daughter reported that patient had a positive COVID test this morning.  Patient reports shortness breath, oxygen saturation 94-97% on room air in the ED.  Chest x-ray is negative for infiltration. -Pending COVID-19 PCR. -If confirms COVID 19 infection,  will start Paxlovid  Patient does not want to take remdesivir. Bronchodilators     DVT ppx: on IV Heparin     Code Status: Full code Family Communication:   Yes, patient's  daughter  at bed side.   Disposition Plan:  Anticipate discharge back to previous environment, SNF Consult: message sent to Dr. Percival Spanish of card Admission status and Level of care: Progressive:   for obs a      Status is: Observation  The patient remains OBS appropriate and will d/c before 2 midnights.         Date of Service 12/27/2020    Ivor Costa [facility]   If 7PM-7AM, please contact night-coverage [email or web address] 12/27/2020, 5:45 PM

## 2020-12-27 NOTE — ED Provider Notes (Addendum)
Emergency Medicine Provider Triage Evaluation Note  [name] , a 75 y.o. female  was evaluated in triage. She was sent from [facility] due to oxygen saturation in 80's before she was placed on 2 liters [place]. She is COVID positive. Patient denies symptoms of concern. Daughter states that she had been declining for the past several days. She is refusing medications, foods, or fluids.   Review of Systems  Positive: N/a Negative: N/a  Physical Exam  There were no vitals taken for this visit. Gen:   Awake, no distress   Resp:  Normal effort  MSK:   Moves extremities without difficulty  Other:    Medical Decision Making  Medically screening exam initiated at 1:04 PM.  Appropriate orders placed.  JAYNI PRESCHER was informed that the remainder of the evaluation will be completed by another provider, this initial triage assessment does not replace that evaluation, and the importance of remaining in the ED until their evaluation is complete.    Victorino Dike, FNP 12/27/20 1307    Victorino Dike, FNP 12/27/20 1316    Arta Silence, MD 12/27/20 1901

## 2020-12-27 NOTE — ED Notes (Signed)
Pt refusing lab draw at this time. Family states pt just wants to be left alone. Dr. Cinda Quest states not to discontinue labs in case family or patient change their mind.

## 2020-12-27 NOTE — ED Triage Notes (Signed)
Pt comes into the ED via EMS from [facility] with c/o pt being Covid + and having low O2 sats there this morning 85-87%RA, pt arrives in NAD, pt denies any pain or SOB, reported from staff at [facility] that pt has dementia and can be physically combative. Sats 100% on RA at present

## 2020-12-27 NOTE — ED Notes (Signed)
Pt continues to refuse IV stick or lab draws as well as refusing cardiac monitoring and medications. Daughter remains at the bedside and wants comfort measures for patient.

## 2020-12-27 NOTE — ED Notes (Signed)
Patient's daughter now at bedside. Patient continues to refuse care and refuse medications. Attempted to reposition patient but patient yelling at staff and saying "Leave me alone! Do not touch my legs!" No UOP at this time. Patient refusing to drink any fluid.

## 2020-12-27 NOTE — ED Notes (Signed)
Pt finally agreeable to covid swab. Refusing all other testing at this time.

## 2020-12-27 NOTE — ED Notes (Signed)
Pt cleansed, new brief and external catheter placed by this writer, Threasa Beards, EDT and [employer], RN.

## 2020-12-27 NOTE — ED Provider Notes (Signed)
[facility] Emergency Department Provider Note   ____________________________________________   Event Date/Time   First MD Initiated Contact with Patient 12/27/20 1304     (approximate)  I have reviewed the triage vital signs and the nursing notes.   HISTORY  Chief Complaint Covid Positive and Shortness of Breath    HPI [name] is a 75 y.o. female who is COVID-positive short of breath and hypoxic.  She is also hypotensive.  She has low-grade fever.  She is not eating or drinking.  She does not want her medicines.  She will not wear her oxygen.  She is absolutely refusing all of this.  She had previously spoken with her daughter about palliative care and that is what the daughter would like to pursue now.  I tried to talk to the patient somewhat and try to get her to wear her oxygen but she again refused.  Patient does have dementia.  She is not violent or confrontative now.  She had been with her UTI in the past.        Past Medical History:  Diagnosis Date   Aortic stenosis    Asthma    Coronary artery disease    Diabetes mellitus without complication ([place])    History of measles    Hypertension    Nonischemic cardiomyopathy ([place])    Pulmonary hypertension ([place])     Patient Active Problem List   Diagnosis Date Noted   Atrial fibrillation with RVR ([place]) 12/27/2020   Suspected COVID-19 virus infection 12/27/2020   Elevated troponin 12/27/2020   Failure to thrive in adult 12/27/2020   CAD (coronary artery disease) 12/27/2020   Asthma 12/27/2020   Dementia ([place]) 12/27/2020   Depression 12/27/2020   Ambulatory dysfunction    Pyelonephritis    Acute UTI 12/12/2020   History of multiple myeloma 10/06/2019   CKD (chronic kidney disease), stage IV (HCC) 04/10/2019   Chronic HFrEF (heart failure with reduced ejection fraction) ([place]) 09/27/2017   Urinary frequency 09/27/2017   Chronic kidney disease, stage 3b ([place]) 03/15/2017   B12  deficiency 03/01/2017   Lymphedema 02/14/2017   Chronic venous insufficiency 02/14/2017   Nonischemic cardiomyopathy ([place]) 06/14/2016   CAD in native artery 06/14/2016   Bilateral lower extremity edema 06/14/2016   Multiple myeloma not having achieved remission ([place]) 03/26/2016   Back pain 03/26/2016   Diverticulosis 03/20/2016   Atherosclerosis of aorta (HCC) 03/20/2016   Congestive heart failure ([place]) 03/07/2016   Monoclonal gammopathy 01/25/2016   Chronic anemia 01/25/2016   LVH (left ventricular hypertrophy) 08/24/2015   Mild aortic stenosis 07/14/2015   Urinary incontinence 03/02/2015   Diabetic retinopathy with macular edema ([place]) 02/19/2015   Constipation 12/02/2014   Osteoarthritis 07/31/2014   Insomnia 06/30/2014   Bleeding internal hemorrhoids 06/30/2014   Osteopenia 06/30/2014   Fecal occult blood test positive 06/30/2014   Vitamin D deficiency 04/29/2009   Type 2 diabetes mellitus with stage 4 chronic kidney disease ([place]) 01/28/2009   Intertrigo 01/03/2008   Hyperlipidemia 04/02/2006   Chronic edema 08/11/2003   BMI 40.0-44.9, adult ([place]) 09/22/2002   Intermittent asthma, uncontrolled 08/31/2000   Essential hypertension 08/31/2000   Leg varices 04/18/1999    Past Surgical History:  Procedure Laterality Date   CARDIAC CATHETERIZATION     Cyst(solitary) of breast:removed     RIGHT/LEFT HEART CATH AND CORONARY ANGIOGRAPHY N/A 03/07/2016   Procedure: Right/Left Heart Cath and Coronary Angiography;  Surgeon: Nelva Bush, MD;  Location: [facility] INVASIVE CV  LAB;  Service: Cardiovascular;  Laterality: N/A;   TUBAL LIGATION      Prior to Admission medications   Medication Sig Start Date End Date Taking? Authorizing Provider  amLODipine (NORVASC) 5 MG tablet Take 5 mg by mouth daily.   Yes [provider]  Cholecalciferol (VITAMIN D) 50 MCG (2000 UT) tablet Take 2,000 Units by mouth daily.   Yes [provider]  cyanocobalamin 1000 MCG tablet Take  1,000 mcg by mouth daily.   Yes [provider]  lisinopril (ZESTRIL) 10 MG tablet TAKE 1 TABLET BY MOUTH EVERY DAY--DOSE INCREASE 08/31/20  Yes End, Harrell Gave, MD  metoprolol tartrate (LOPRESSOR) 50 MG tablet Take 1.5 tablets (75 mg total) by mouth 2 (two) times daily. 11/17/20  Yes End, Harrell Gave, MD  mirtazapine (REMERON) 7.5 MG tablet Take 7.5 mg by mouth at bedtime.   Yes [provider]  nystatin (MYCOSTATIN/NYSTOP) powder Apply 1 application topically 3 (three) times daily. Apply to areas under the breast 12/08/20  Yes Rada Hay, MD  polyethylene glycol (MIRALAX / GLYCOLAX) 17 g packet Take 17 g by mouth daily.   Yes [provider]  senna-docusate (SENOKOT-S) 8.6-50 MG tablet Take 1 tablet by mouth 2 (two) times daily.   Yes [provider]  albuterol (VENTOLIN HFA) 108 (90 Base) MCG/ACT inhaler INHALE 1-2 PUFFS INTO THE LUNGS EVERY 6 (SIX) HOURS AS NEEDED FOR SHORTNESS OF BREATH. 04/23/20   Birdie Sons, MD  donepezil (ARICEPT) 5 MG tablet Take 1 tablet by mouth at bedtime. 11/04/20 11/04/21  [provider]  furosemide (LASIX) 20 MG tablet TAKE 1 TABLET (20 MG TOTAL) BY MOUTH DAILY AS NEEDED FOR FLUID. 12/17/20   Birdie Sons, MD  levalbuterol (XOPENEX) 1.25 MG/3ML nebulizer solution USE 1 VIAL IN NEBULIZER EVERY 8 HOURS AS NEEDED FOR WHEEZING 05/14/20   Birdie Sons, MD  oxybutynin (DITROPAN) 5 MG tablet Take 5 mg by mouth 2 (two) times daily. Patient not taking: Reported on 12/27/2020 12/20/20   [provider]  QUEtiapine (SEROQUEL) 25 MG tablet TAKE 1 TABLET BY MOUTH EVERYDAY AT BEDTIME 12/17/20   Birdie Sons, MD  traZODone (DESYREL) 100 MG tablet Take 100 mg by mouth at bedtime as needed.    [provider]    Allergies Hydrochlorothiazide and Penicillins  Family History  Problem Relation Age of Onset   Cancer Mother        Uterine cancer   Diabetes Mother    Heart disease Mother    Heart  failure Mother    Parkinson's disease Father    Cancer Sister        breast cancer   Hypertension Sister    Diabetes Sister        Non-insulin dependent Diabetes Mellitus   Diabetes Brother        Non-insulin Dependent Diabetes Mellitus   Cancer Brother    Mesothelioma Brother     Social History Social History   Tobacco Use   Smoking status: Never   Smokeless tobacco: Never  Vaping Use   Vaping Use: Never used  Substance Use Topics   Alcohol use: No   Drug use: No    Review of Systems  Unable to obtain a good review of systems as patient does not want to talk about things.  ____________________________________________   PHYSICAL EXAM:  VITAL SIGNS: ED Triage Vitals  Enc Vitals Group     BP 12/27/20 1307 (!) 149/94     Pulse  Rate 12/27/20 1307 (!) 109     Resp 12/27/20 1307 18     Temp 12/27/20 1307 99.1 F (37.3 C)     Temp Source 12/27/20 1307 Oral     SpO2 12/27/20 1307 94 %     Weight 12/27/20 1309 200 lb (90.7 kg)     Height 12/27/20 1309 5' (1.524 m)     Head Circumference --      [facility] Flow --      Pain Score 12/27/20 1309 0     Pain Loc --      Pain Edu? --      Excl. in [place]? --     Constitutional: Alert and oriented to person and hospital at the very least. Eyes: Conjunctivae are normal. PER Head: Atraumatic. Nose: No congestion/rhinnorhea. Mouth/Throat: Mucous membranes are moist.  Oropharynx non-erythematous. Neck: No stridor.   Cardiovascular: Normal rate, regular rhythm. Grossly normal heart sounds.  Good peripheral circulation. Respiratory: Normal respiratory effort.  No retractions. Lungs CTAB. Gastrointestinal: Soft and nontender. No distention. No abdominal bruits.  Musculoskeletal: No lower extremity tenderness nor edema.  Skin is wrinkled as though she is lost fluid/edema.  Daughter confirms that this is the case. Neurologic:  Normal speech and language. No gross focal neurologic deficits are appreciated.  Skin:  Skin is warm, dry and  intact. No rash noted.   ____________________________________________   LABS (all labs ordered are listed, but only abnormal results are displayed)  Labs Reviewed  BASIC METABOLIC PANEL - Abnormal; Notable for the following components:      Result Value   Glucose, Bld 168 (*)    BUN 26 (*)    Creatinine, Ser 1.89 (*)    GFR, Estimated 27 (*)    All other components within normal limits  CBC - Abnormal; Notable for the following components:   RBC 3.63 (*)    Hemoglobin 10.3 (*)    HCT 33.2 (*)    All other components within normal limits  BRAIN NATRIURETIC PEPTIDE - Abnormal; Notable for the following components:   B Natriuretic Peptide 128.9 (*)    All other components within normal limits  HEPATIC FUNCTION PANEL - Abnormal; Notable for the following components:   Albumin 2.4 (*)    All other components within normal limits  TROPONIN I (HIGH SENSITIVITY) - Abnormal; Notable for the following components:   Troponin I (High Sensitivity) 70 (*)    All other components within normal limits  CULTURE, BLOOD (ROUTINE X 2)  CULTURE, BLOOD (ROUTINE X 2)  RESP PANEL BY RT-PCR (FLU A&B, COVID) ARPGX2  PROCALCITONIN  URINALYSIS, ROUTINE W REFLEX MICROSCOPIC  LACTIC ACID, PLASMA  LACTIC ACID, PLASMA  HEMOGLOBIN A1C  C-REACTIVE PROTEIN  CBC  CREATININE, SERUM  TROPONIN I (HIGH SENSITIVITY)   ____________________________________________  EKG  EKG read interpreted by me shows A. fib with RVR at a rate of 120 nonspecific ST-T wave changes. ____________________________________________  RADIOLOGY Gertha Calkin, personally viewed and evaluated these images (plain radiographs) as part of my medical decision making, as well as reviewing the written report by the radiologist.  ED MD interpretation: Radiology reads chest x-ray has cardiomegaly with low lung volumes no acute changes.  I reviewed the film and agree although there may be some fullness in the hilum on the  right.  Official radiology report(s): DG Chest 2 View  Result Date: 12/27/2020 CLINICAL DATA:  COVID-19 positive with decreased oxygen saturation. EXAM: CHEST - 2 VIEW COMPARISON:  12/11/2020  FINDINGS: Lateral view degraded by patient arm position. Midline trachea. Mild cardiomegaly. Atherosclerosis in the transverse aorta. Right paratracheal soft tissue fullness has been grossly similar back to 2017 and is likely due to prominent great vessels. No pleural effusion or pneumothorax. No congestive failure. Low lung volumes with resultant pulmonary interstitial prominence. IMPRESSION: Cardiomegaly and low lung volumes, without acute disease. Electronically Signed   By: Abigail Miyamoto M.D.   On: 12/27/2020 13:58    ____________________________________________   PROCEDURES  Procedure(s) performed (including Critical Care):  Procedures   ____________________________________________   INITIAL IMPRESSION / ASSESSMENT AND PLAN / ED COURSE  Patient with COVID low-grade fever and a hypoxia with hypotension.  She is refusing all treatment.  Daughter wishes palliative care.  She may have a pneumonia from the [place].  She is also in A. fib with RVR.  This may be due to her illness.  She is certainly also getting dehydrated as she has not been taking any fluids by mouth.  Patient does not want any further blood work drawn either.  I have put in a palliative care consult and consult with the hospitalist for admission for palliative care.  [facility] did not want her there.              ____________________________________________   FINAL CLINICAL IMPRESSION(S) / ED DIAGNOSES  Final diagnoses:  COVID  Hypoxia  [facility])     ED Discharge Orders     None        Note:  This document was prepared using Dragon voice recognition software and may include unintentional dictation errors.    Nena Polio, MD 12/27/20 [phone]

## 2020-12-27 NOTE — ED Notes (Signed)
Patient refuses scheduled medication. Refusing telemetry monitoring. Resp even, unlabored on RA. Additional blanket provided per request.

## 2020-12-28 DIAGNOSIS — Z8049 Family history of malignant neoplasm of other genital organs: Secondary | ICD-10-CM | POA: Diagnosis not present

## 2020-12-28 DIAGNOSIS — U071 COVID-19: Secondary | ICD-10-CM | POA: Diagnosis not present

## 2020-12-28 DIAGNOSIS — I5022 Chronic systolic (congestive) heart failure: Secondary | ICD-10-CM

## 2020-12-28 DIAGNOSIS — Z515 Encounter for palliative care: Secondary | ICD-10-CM | POA: Diagnosis not present

## 2020-12-28 DIAGNOSIS — R4589 Other symptoms and signs involving emotional state: Secondary | ICD-10-CM | POA: Diagnosis not present

## 2020-12-28 DIAGNOSIS — I272 Pulmonary hypertension, unspecified: Secondary | ICD-10-CM | POA: Diagnosis present

## 2020-12-28 DIAGNOSIS — Z803 Family history of malignant neoplasm of breast: Secondary | ICD-10-CM | POA: Diagnosis not present

## 2020-12-28 DIAGNOSIS — Z66 Do not resuscitate: Secondary | ICD-10-CM | POA: Diagnosis not present

## 2020-12-28 DIAGNOSIS — C9000 Multiple myeloma not having achieved remission: Secondary | ICD-10-CM | POA: Diagnosis not present

## 2020-12-28 DIAGNOSIS — J45909 Unspecified asthma, uncomplicated: Secondary | ICD-10-CM | POA: Diagnosis present

## 2020-12-28 DIAGNOSIS — I4891 Unspecified atrial fibrillation: Secondary | ICD-10-CM

## 2020-12-28 DIAGNOSIS — Z7982 Long term (current) use of aspirin: Secondary | ICD-10-CM | POA: Diagnosis not present

## 2020-12-28 DIAGNOSIS — I5042 Chronic combined systolic (congestive) and diastolic (congestive) heart failure: Secondary | ICD-10-CM | POA: Diagnosis present

## 2020-12-28 DIAGNOSIS — I350 Nonrheumatic aortic (valve) stenosis: Secondary | ICD-10-CM

## 2020-12-28 DIAGNOSIS — I480 Paroxysmal atrial fibrillation: Principal | ICD-10-CM

## 2020-12-28 DIAGNOSIS — E1122 Type 2 diabetes mellitus with diabetic chronic kidney disease: Secondary | ICD-10-CM | POA: Diagnosis not present

## 2020-12-28 DIAGNOSIS — E785 Hyperlipidemia, unspecified: Secondary | ICD-10-CM | POA: Diagnosis present

## 2020-12-28 DIAGNOSIS — F0393 Unspecified dementia, unspecified severity, with mood disturbance: Secondary | ICD-10-CM | POA: Diagnosis present

## 2020-12-28 DIAGNOSIS — J4520 Mild intermittent asthma, uncomplicated: Secondary | ICD-10-CM | POA: Diagnosis not present

## 2020-12-28 DIAGNOSIS — R531 Weakness: Secondary | ICD-10-CM | POA: Diagnosis not present

## 2020-12-28 DIAGNOSIS — I2510 Atherosclerotic heart disease of native coronary artery without angina pectoris: Secondary | ICD-10-CM | POA: Diagnosis not present

## 2020-12-28 DIAGNOSIS — R627 Adult failure to thrive: Secondary | ICD-10-CM | POA: Diagnosis not present

## 2020-12-28 DIAGNOSIS — F03C0 Unspecified dementia, severe, without behavioral disturbance, psychotic disturbance, mood disturbance, and anxiety: Secondary | ICD-10-CM | POA: Diagnosis not present

## 2020-12-28 DIAGNOSIS — Z7189 Other specified counseling: Secondary | ICD-10-CM | POA: Diagnosis not present

## 2020-12-28 DIAGNOSIS — I248 Other forms of acute ischemic heart disease: Secondary | ICD-10-CM | POA: Diagnosis present

## 2020-12-28 DIAGNOSIS — I428 Other cardiomyopathies: Secondary | ICD-10-CM | POA: Diagnosis present

## 2020-12-28 DIAGNOSIS — F03911 Unspecified dementia, unspecified severity, with agitation: Secondary | ICD-10-CM | POA: Diagnosis present

## 2020-12-28 DIAGNOSIS — N184 Chronic kidney disease, stage 4 (severe): Secondary | ICD-10-CM | POA: Diagnosis not present

## 2020-12-28 DIAGNOSIS — R404 Transient alteration of awareness: Secondary | ICD-10-CM | POA: Diagnosis not present

## 2020-12-28 DIAGNOSIS — I130 Hypertensive heart and chronic kidney disease with heart failure and stage 1 through stage 4 chronic kidney disease, or unspecified chronic kidney disease: Secondary | ICD-10-CM | POA: Diagnosis present

## 2020-12-28 DIAGNOSIS — Z7401 Bed confinement status: Secondary | ICD-10-CM | POA: Diagnosis not present

## 2020-12-28 DIAGNOSIS — Z79899 Other long term (current) drug therapy: Secondary | ICD-10-CM | POA: Diagnosis not present

## 2020-12-28 DIAGNOSIS — Z743 Need for continuous supervision: Secondary | ICD-10-CM | POA: Diagnosis not present

## 2020-12-28 DIAGNOSIS — R0902 Hypoxemia: Secondary | ICD-10-CM | POA: Diagnosis present

## 2020-12-28 LAB — URINALYSIS, ROUTINE W REFLEX MICROSCOPIC
Bilirubin Urine: NEGATIVE
Glucose, UA: NEGATIVE mg/dL
Ketones, ur: NEGATIVE mg/dL
Nitrite: NEGATIVE
Protein, ur: >300 mg/dL — AB
Specific Gravity, Urine: 1.025 (ref 1.005–1.030)
pH: 5.5 (ref 5.0–8.0)

## 2020-12-28 LAB — LIPID PANEL
Cholesterol: 161 mg/dL (ref 0–200)
HDL: 26 mg/dL — ABNORMAL LOW (ref >40)
LDL Cholesterol: 99 mg/dL (ref 0–99)
Total CHOL/HDL Ratio: 6.2 RATIO
Triglycerides: 179 mg/dL — ABNORMAL HIGH (ref <150)
VLDL: 36 mg/dL (ref 0–40)

## 2020-12-28 LAB — C-REACTIVE PROTEIN: CRP: 5.6 mg/dL — ABNORMAL HIGH (ref <1.0)

## 2020-12-28 LAB — BASIC METABOLIC PANEL
Anion gap: 7 (ref 5–15)
BUN: 35 mg/dL — ABNORMAL HIGH (ref 8–23)
CO2: 26 mmol/L (ref 22–32)
Calcium: 9.1 mg/dL (ref 8.9–10.3)
Chloride: 106 mmol/L (ref 98–111)
Creatinine, Ser: 2.05 mg/dL — ABNORMAL HIGH (ref 0.44–1.00)
GFR, Estimated: 25 mL/min — ABNORMAL LOW (ref >60)
Glucose, Bld: 140 mg/dL — ABNORMAL HIGH (ref 70–99)
Potassium: 3.7 mmol/L (ref 3.5–5.1)
Sodium: 139 mmol/L (ref 135–145)

## 2020-12-28 LAB — URINALYSIS, MICROSCOPIC (REFLEX)

## 2020-12-28 LAB — HEMOGLOBIN A1C
Hgb A1c MFr Bld: 6.4 % — ABNORMAL HIGH (ref 4.8–5.6)
Mean Plasma Glucose: 136.98 mg/dL

## 2020-12-28 LAB — TROPONIN I (HIGH SENSITIVITY): Troponin I (High Sensitivity): 53 ng/L — ABNORMAL HIGH (ref <18)

## 2020-12-28 LAB — CBC
HCT: 30.4 % — ABNORMAL LOW (ref 36.0–46.0)
Hemoglobin: 9.3 g/dL — ABNORMAL LOW (ref 12.0–15.0)
MCH: 27.8 pg (ref 26.0–34.0)
MCHC: 30.6 g/dL (ref 30.0–36.0)
MCV: 90.7 fL (ref 80.0–100.0)
Platelets: 290 10*3/uL (ref 150–400)
RBC: 3.35 MIL/uL — ABNORMAL LOW (ref 3.87–5.11)
RDW: 14.3 % (ref 11.5–15.5)
WBC: 9.1 10*3/uL (ref 4.0–10.5)
nRBC: 0 % (ref 0.0–0.2)

## 2020-12-28 LAB — TSH: TSH: 0.719 u[IU]/mL (ref 0.350–4.500)

## 2020-12-28 LAB — PROTIME-INR
INR: 1.1 (ref 0.8–1.2)
Prothrombin Time: 13.8 seconds (ref 11.4–15.2)

## 2020-12-28 LAB — APTT: aPTT: 28 seconds (ref 24–36)

## 2020-12-28 LAB — LACTIC ACID, PLASMA: Lactic Acid, Venous: 1.3 mmol/L (ref 0.5–1.9)

## 2020-12-28 MED ORDER — HALOPERIDOL LACTATE 5 MG/ML IJ SOLN
2.0000 mg | Freq: Four times a day (QID) | INTRAMUSCULAR | Status: DC | PRN
  Filled 2020-12-28: qty 1

## 2020-12-28 MED ORDER — MIRTAZAPINE 15 MG PO TABS: 7.5000 mg | ORAL_TABLET | Freq: Every day | ORAL | Status: DC

## 2020-12-28 MED ORDER — MIRTAZAPINE 15 MG PO TABS: 15.0000 mg | ORAL_TABLET | Freq: Every day | ORAL | Status: DC

## 2020-12-28 NOTE — Consult Note (Signed)
[facility] Face-to-Face Psychiatry Consult   Reason for Consult:  medical admit  Referring Physician:  EDP  Patient Identification: [name] MRN:  [id number] Principal Diagnosis: Atrial fibrillation with RVR ([place]) Diagnosis:  Principal Problem:   Atrial fibrillation with RVR ([place]) Active Problems:   Type 2 diabetes mellitus with stage 4 chronic kidney disease ([place])   Essential hypertension   Multiple myeloma not having achieved remission (HCC)   Chronic HFrEF (heart failure with reduced ejection fraction) (HCC)   CKD (chronic kidney disease), stage IV (HCC)   Suspected COVID-19 virus infection   Elevated troponin   Failure to thrive in adult   CAD (coronary artery disease)   Asthma   Dementia ([place])   Depression   Total Time spent with patient: 1 hour  Subjective:   [name] is a 75 y.o. female patient admitted with low O2 sats from [facility]. Not compliant with meds  HPI: Patient seen and chart reviewed. Patient responds that she is in the hospital. She responds "no" to thoughts of harming herself. When asked "Do you want to live?" She responds "yes." Discussed with patient importance of taking her medication so that she can do that and Dr. Caryn Section wants her to. Patient states that she will take medication that Dr. Caryn Section wants her to and that she will eat. Sister's sister walked in the room and patient brightened/smiled. Sister was able to get patient to eat and drink from her food tray.      Collateral from daughter, [name] [phone]): She states that patient's husband and patient were married more than 23 years and he "took total care of her." "She was pampered by him." He was diagnosed with cancer in October of 2022 and went to the hospital and passed away within 2 weeks.  Daughter states that the [facility]  [facility] was talking about giving her Remeron, but patient has been refusing meds. Daughter states she will be contacting Dr.  Caryn Section regarding patient. Patient likes Dr. Caryn Section and daughter states she will likely do what he says.   Past Psychiatric History: none, other than dementia  Risk to Self:   Risk to Others:   Prior Inpatient Therapy:   Prior Outpatient Therapy:    Past Medical History:  Past Medical History:  Diagnosis Date   Aortic stenosis    Asthma    Coronary artery disease    Diabetes mellitus without complication ([place])    History of measles    Hypertension    Nonischemic cardiomyopathy ([place])    Pulmonary hypertension ([place])     Past Surgical History:  Procedure Laterality Date   CARDIAC CATHETERIZATION     Cyst(solitary) of breast:removed     RIGHT/LEFT HEART CATH AND CORONARY ANGIOGRAPHY N/A 03/07/2016   Procedure: Right/Left Heart Cath and Coronary Angiography;  Surgeon: Nelva Bush, MD;  Location: [facility];  Service: Cardiovascular;  Laterality: N/A;   TUBAL LIGATION     Family History:  Family History  Problem Relation Age of Onset   Cancer Mother        Uterine cancer   Diabetes Mother    Heart disease Mother    Heart failure Mother    Parkinson's disease Father    Cancer Sister        breast cancer   Hypertension Sister    Diabetes Sister        Non-insulin dependent Diabetes Mellitus   Diabetes Brother  Non-insulin Dependent Diabetes Mellitus   Cancer Brother    Mesothelioma Brother    Family Psychiatric  History: none Social History:  Social History   Substance and Sexual Activity  Alcohol Use No     Social History   Substance and Sexual Activity  Drug Use No    Social History   Socioeconomic History   Marital status: Widowed    Spouse name: Not on file   Number of children: 2   Years of education: Not on file   Highest education level: High school graduate  Occupational History   Occupation: retired  Tobacco Use   Smoking status: Never   Smokeless tobacco: Never  Vaping Use   Vaping Use: Never used  Substance and Sexual  Activity   Alcohol use: No   Drug use: No   Sexual activity: Yes    Birth control/protection: None  Other Topics Concern   Not on file  Social History Narrative   Not on file   Social Determinants of Health   Financial Resource Strain: Low Risk    Difficulty of Paying Living Expenses: Not hard at all  Food Insecurity: No Food Insecurity   Worried About Charity fundraiser in the Last Year: Never true   [place] in the Last Year: Never true  Transportation Needs: No Transportation Needs   Lack of Transportation (Medical): No   Lack of Transportation (Non-Medical): No  Physical Activity: Inactive   Days of Exercise per Week: 0 days   Minutes of Exercise per Session: 0 min  Stress: No Stress Concern Present   Feeling of Stress : Only a little  Social Connections: Moderately Isolated   Frequency of Communication with Friends and Family: More than three times a week   Frequency of Social Gatherings with Friends and Family: Never   Attends Religious Services: Never   Marine scientist or Organizations: No   Attends Archivist Meetings: Never   Marital Status: Married   Additional Social History:    Allergies:   Allergies  Allergen Reactions   Hydrochlorothiazide Other (See Comments)    Leg Cramps   Penicillins Rash    Labs:  Results for orders placed or performed during the hospital encounter of 12/27/20 (from the past 48 hour(s))  Urinalysis, Routine w reflex microscopic     Status: Abnormal   Collection Time: 12/27/20  6:37 AM  Result Value Ref Range   Color, Urine YELLOW YELLOW    Comment: YELLOW   APPearance CLEAR CLEAR    Comment: CLEAR   Specific Gravity, Urine 1.025 1.005 - 1.030   pH 5.5 5.0 - 8.0   Glucose, UA NEGATIVE NEGATIVE mg/dL   Hgb urine dipstick MODERATE (A) NEGATIVE   Bilirubin Urine NEGATIVE NEGATIVE   Ketones, ur NEGATIVE NEGATIVE mg/dL   Protein, ur >300 (A) NEGATIVE mg/dL   Nitrite NEGATIVE NEGATIVE   Leukocytes,Ua  SMALL (A) NEGATIVE    Comment: Performed at [facility], [address] [place], [place] [address]  Urinalysis, Microscopic (reflex)     Status: Abnormal   Collection Time: 12/27/20  6:37 AM  Result Value Ref Range   RBC / HPF 0-5 0 - 5 RBC/hpf   WBC, UA 6-10 0 - 5 WBC/hpf   Bacteria, UA FEW (A) NONE SEEN   Squamous Epithelial / LPF 6-10 0 - 5   Hyaline Casts, UA PRESENT     Comment: Performed at [facility], [address] [place], [place] [address]  Basic metabolic panel     Status: Abnormal   Collection Time: 12/27/20  1:13 PM  Result Value Ref Range   Sodium 138 135 - 145 mmol/L   Potassium 3.6 3.5 - 5.1 mmol/L   Chloride 106 98 - 111 mmol/L   CO2 26 22 - 32 mmol/L   Glucose, Bld 168 (H) 70 - 99 mg/dL    Comment: Glucose reference range applies only to samples taken after fasting for at least 8 hours.   BUN 26 (H) 8 - 23 mg/dL   Creatinine, Ser 1.89 (H) 0.44 - 1.00 mg/dL   Calcium 9.1 8.9 - 10.3 mg/dL   GFR, Estimated 27 (L) >60 mL/min    Comment: (NOTE) Calculated using the CKD-EPI Creatinine Equation (2021)    Anion gap 6 5 - 15    Comment: Performed at [facility], [address] [place], [place] [address]  CBC     Status: Abnormal   Collection Time: 12/27/20  1:13 PM  Result Value Ref Range   WBC 8.9 4.0 - 10.5 K/uL   RBC 3.63 (L) 3.87 - 5.11 MIL/uL   Hemoglobin 10.3 (L) 12.0 - 15.0 g/dL   HCT 33.2 (L) 36.0 - 46.0 %   MCV 91.5 80.0 - 100.0 fL   MCH 28.4 26.0 - 34.0 pg   MCHC 31.0 30.0 - 36.0 g/dL   RDW 14.1 11.5 - 15.5 %   Platelets 331 150 - 400 K/uL   nRBC 0.0 0.0 - 0.2 %    Comment: Performed at [facility], [place], [place] [address]  Troponin I (High Sensitivity)     Status: Abnormal   Collection Time: 12/27/20  1:13 PM  Result Value Ref Range   Troponin I (High Sensitivity) 70 (H) <18 ng/L    Comment: (NOTE) Elevated high sensitivity troponin I (hsTnI) values and significant  changes across  serial measurements may suggest ACS but many other  chronic and acute conditions are known to elevate hsTnI results.  Refer to the "Links" section for chest pain algorithms and additional  guidance. Performed at [facility], [address] [place], [place] [address]   Procalcitonin - Baseline     Status: None   Collection Time: 12/27/20  1:13 PM  Result Value Ref Range   Procalcitonin <0.10 ng/mL    Comment:        Interpretation: PCT (Procalcitonin) <= 0.5 ng/mL: Systemic infection (sepsis) is not likely. Local bacterial infection is possible. (NOTE)       Sepsis PCT Algorithm           Lower Respiratory Tract                                      Infection PCT Algorithm    ----------------------------     ----------------------------         PCT < 0.25 ng/mL                PCT < 0.10 ng/mL          Strongly encourage             Strongly discourage   discontinuation of antibiotics    initiation of antibiotics    ----------------------------     -----------------------------       PCT 0.25 - 0.50 ng/mL  PCT 0.10 - 0.25 ng/mL               OR       >80% decrease in PCT            Discourage initiation of                                            antibiotics      Encourage discontinuation           of antibiotics    ----------------------------     -----------------------------         PCT >= 0.50 ng/mL              PCT 0.26 - 0.50 ng/mL               AND        <80% decrease in PCT             Encourage initiation of                                             antibiotics       Encourage continuation           of antibiotics    ----------------------------     -----------------------------        PCT >= 0.50 ng/mL                  PCT > 0.50 ng/mL               AND         increase in PCT                  Strongly encourage                                      initiation of antibiotics    Strongly encourage escalation           of antibiotics                                      -----------------------------                                           PCT <= 0.25 ng/mL                                                 OR                                        > 80% decrease in PCT  Discontinue / Do not initiate                                             antibiotics  Performed at [facility], [address] [place], [place] [address]   Brain natriuretic peptide     Status: Abnormal   Collection Time: 12/27/20  1:13 PM  Result Value Ref Range   B Natriuretic Peptide 128.9 (H) 0.0 - 100.0 pg/mL    Comment: Performed at [facility], [address] [place], [place] [address]  Hepatic function panel     Status: Abnormal   Collection Time: 12/27/20  1:13 PM  Result Value Ref Range   Total Protein 7.6 6.5 - 8.1 g/dL   Albumin 2.4 (L) 3.5 - 5.0 g/dL   AST 19 15 - 41 U/L   ALT 9 0 - 44 U/L   Alkaline Phosphatase 64 38 - 126 U/L   Total Bilirubin 0.3 0.3 - 1.2 mg/dL   Bilirubin, Direct <0.1 0.0 - 0.2 mg/dL   Indirect Bilirubin NOT CALCULATED 0.3 - 0.9 mg/dL    Comment: Performed at [facility], [address] [place], [place] [address]  Resp Panel by RT-PCR (Flu A&B, Covid) Nasopharyngeal Swab     Status: Abnormal   Collection Time: 12/27/20  5:29 PM   Specimen: Nasopharyngeal Swab; Nasopharyngeal(NP) swabs in vial transport medium  Result Value Ref Range   SARS Coronavirus 2 by RT PCR POSITIVE (A) NEGATIVE    Comment: (NOTE) SARS-CoV-2 target nucleic acids are DETECTED.  The SARS-CoV-2 RNA is generally detectable in upper respiratory specimens during the acute phase of infection. Positive results are indicative of the presence of the identified virus, but do not rule out bacterial infection or co-infection with other pathogens not detected by the test. Clinical correlation with patient history and other diagnostic information is necessary to determine  patient infection status. The expected result is Negative.  Fact Sheet for Patients: EntrepreneurPulse.com.au  Fact Sheet for Healthcare Providers: IncredibleEmployment.be  This test is not yet approved or cleared by the [place] FDA and  has been authorized for detection and/or diagnosis of SARS-CoV-2 by FDA under an Emergency Use Authorization (EUA).  This EUA will remain in effect (meaning this test can be used) for the duration of  the COVID-19 declaration under Section 564(b)(1) of the A ct, 21 U.S.C. section 360bbb-3(b)(1), unless the authorization is terminated or revoked sooner.     Influenza A by PCR NEGATIVE NEGATIVE   Influenza B by PCR NEGATIVE NEGATIVE    Comment: (NOTE) The Xpert Xpress SARS-CoV-2/FLU/RSV plus assay is intended as an aid in the diagnosis of influenza from Nasopharyngeal swab specimens and should not be used as a sole basis for treatment. Nasal washings and aspirates are unacceptable for Xpert Xpress SARS-CoV-2/FLU/RSV testing.  Fact Sheet for Patients: EntrepreneurPulse.com.au  Fact Sheet for Healthcare Providers: IncredibleEmployment.be  This test is not yet approved or cleared by the [place] FDA and has been authorized for detection and/or diagnosis of SARS-CoV-2 by FDA under an Emergency Use Authorization (EUA). This EUA will remain in effect (meaning this test can be used) for the duration of the COVID-19 declaration under Section 564(b)(1) of the Act, 21 U.S.C. section 360bbb-3(b)(1), unless the authorization is terminated or revoked.  Performed at [facility], [address], [place], [place] [address]  Lipid panel     Status: Abnormal   Collection Time: 12/28/20  5:35 PM  Result Value Ref Range   Cholesterol 161 0 - 200 mg/dL   Triglycerides 179 (H) <150 mg/dL   HDL 26 (L) >40 mg/dL   Total CHOL/HDL Ratio 6.2 RATIO   VLDL 36 0 - 40 mg/dL    LDL Cholesterol 99 0 - 99 mg/dL    Comment:        Total Cholesterol/HDL:CHD Risk Coronary Heart Disease Risk Table                     Men   Women  1/2 Average Risk   3.4   3.3  Average Risk       5.0   4.4  2 X Average Risk   9.6   7.1  3 X Average Risk  23.4   11.0        Use the calculated Patient Ratio above and the CHD Risk Table to determine the patient's CHD Risk.        ATP III CLASSIFICATION (LDL):  <100     mg/dL   Optimal  100-129  mg/dL   Near or Above                    Optimal  130-159  mg/dL   Borderline  160-189  mg/dL   High  >190     mg/dL   Very High Performed at [facility], [address] [place], [place] [address]   Basic metabolic panel     Status: Abnormal   Collection Time: 12/28/20  5:35 PM  Result Value Ref Range   Sodium 139 135 - 145 mmol/L   Potassium 3.7 3.5 - 5.1 mmol/L   Chloride 106 98 - 111 mmol/L   CO2 26 22 - 32 mmol/L   Glucose, Bld 140 (H) 70 - 99 mg/dL    Comment: Glucose reference range applies only to samples taken after fasting for at least 8 hours.   BUN 35 (H) 8 - 23 mg/dL   Creatinine, Ser 2.05 (H) 0.44 - 1.00 mg/dL   Calcium 9.1 8.9 - 10.3 mg/dL   GFR, Estimated 25 (L) >60 mL/min    Comment: (NOTE) Calculated using the CKD-EPI Creatinine Equation (2021)    Anion gap 7 5 - 15    Comment: Performed at [facility], [address] [place], [place] [address]  CBC     Status: Abnormal   Collection Time: 12/28/20  5:35 PM  Result Value Ref Range   WBC 9.1 4.0 - 10.5 K/uL   RBC 3.35 (L) 3.87 - 5.11 MIL/uL   Hemoglobin 9.3 (L) 12.0 - 15.0 g/dL   HCT 30.4 (L) 36.0 - 46.0 %   MCV 90.7 80.0 - 100.0 fL   MCH 27.8 26.0 - 34.0 pg   MCHC 30.6 30.0 - 36.0 g/dL   RDW 14.3 11.5 - 15.5 %   Platelets 290 150 - 400 K/uL   nRBC 0.0 0.0 - 0.2 %    Comment: Performed at [facility], [address] [place], [place] [address]  Lactic acid, plasma     Status: None   Collection Time: 12/28/20  5:36 PM   Result Value Ref Range   Lactic Acid, Venous 1.3 0.5 - 1.9 mmol/L    Comment: Performed at [facility], [address] [place], [place] [address]  APTT     Status:  None   Collection Time: 12/28/20  5:36 PM  Result Value Ref Range   aPTT 28 24 - 36 seconds    Comment: Performed at [facility], [address] [place], [place] [address]  Protime-INR     Status: None   Collection Time: 12/28/20  5:36 PM  Result Value Ref Range   Prothrombin Time 13.8 11.4 - 15.2 seconds   INR 1.1 0.8 - 1.2    Comment: (NOTE) INR goal varies based on device and disease states. Performed at [facility], [address], [place],  [address]     Current Facility-Administered Medications  Medication Dose Route Frequency Provider Last Rate Last Admin   acetaminophen (TYLENOL) tablet 650 mg  650 mg Oral Q6H PRN Ivor Costa, MD       albuterol (VENTOLIN HFA) 108 (90 Base) MCG/ACT inhaler 2 puff  2 puff Inhalation Q4H PRN Ivor Costa, MD       aspirin EC tablet 81 mg  81 mg Oral Daily Ivor Costa, MD       cholecalciferol (VITAMIN D3) tablet 2,000 Units  2,000 Units Oral Daily Ivor Costa, MD       dextromethorphan-guaiFENesin ([place] DM) 30-600 MG per 12 hr tablet 1 tablet  1 tablet Oral BID PRN Ivor Costa, MD       donepezil (ARICEPT) tablet 5 mg  5 mg Oral QHS Ivor Costa, MD       heparin ADULT infusion 100 units/mL (25000 units/235m)  1,000 Units/hr Intravenous Continuous Rauer, SForde Dandy RPH   Held at 12/27/20 1816   heparin bolus via infusion 3,400 Units  3,400 Units Intravenous Once Rauer, Samantha O, RPH       hydrALAZINE (APRESOLINE) injection 5 mg  5 mg Intravenous Q2H PRN NIvor Costa MD       metoprolol tartrate (LOPRESSOR) tablet 75 mg  75 mg Oral BID NIvor Costa MD       mirtazapine (REMERON) tablet 7.5 mg  7.5 mg Oral QHS BWaldon MerlF, NP       nystatin (MYCOSTATIN/NYSTOP) topical powder 1 application  1 application Topical TID NIvor Costa MD        ondansetron ([facility] injection 4 mg  4 mg Intravenous Q8H PRN NIvor Costa MD       QUEtiapine (SEROQUEL) tablet 25 mg  25 mg Oral QHS NIvor Costa MD       senna-docusate (Senokot-S) tablet 1 tablet  1 tablet Oral BID NIvor Costa MD       traZODone (DESYREL) tablet 100 mg  100 mg Oral QHS PRN NIvor Costa MD       vitamin B-12 (CYANOCOBALAMIN) tablet 1,000 mcg  1,000 mcg Oral Daily NIvor Costa MD       Current Outpatient Medications  Medication Sig Dispense Refill   amLODipine (NORVASC) 5 MG tablet Take 5 mg by mouth daily.     Cholecalciferol (VITAMIN D) 50 MCG (2000 UT) tablet Take 2,000 Units by mouth daily.     cyanocobalamin 1000 MCG tablet Take 1,000 mcg by mouth daily.     lisinopril (ZESTRIL) 10 MG tablet TAKE 1 TABLET BY MOUTH EVERY DAY--DOSE INCREASE 90 tablet 1   metoprolol tartrate (LOPRESSOR) 50 MG tablet Take 1.5 tablets (75 mg total) by mouth 2 (two) times daily. 270 tablet 0   mirtazapine (REMERON) 7.5 MG tablet Take 7.5 mg by mouth at bedtime.     nystatin (MYCOSTATIN/NYSTOP) powder Apply 1 application topically 3 (three) times daily. Apply to  areas under the breast 15 g 0   polyethylene glycol (MIRALAX / GLYCOLAX) 17 g packet Take 17 g by mouth daily.     senna-docusate (SENOKOT-S) 8.6-50 MG tablet Take 1 tablet by mouth 2 (two) times daily.     albuterol (VENTOLIN HFA) 108 (90 Base) MCG/ACT inhaler INHALE 1-2 PUFFS INTO THE LUNGS EVERY 6 (SIX) HOURS AS NEEDED FOR SHORTNESS OF BREATH. 8.5 each 1   donepezil (ARICEPT) 5 MG tablet Take 1 tablet by mouth at bedtime.     furosemide (LASIX) 20 MG tablet TAKE 1 TABLET (20 MG TOTAL) BY MOUTH DAILY AS NEEDED FOR FLUID. 30 tablet 1   levalbuterol (XOPENEX) 1.25 MG/3ML nebulizer solution USE 1 VIAL IN NEBULIZER EVERY 8 HOURS AS NEEDED FOR WHEEZING 90 mL 3   oxybutynin (DITROPAN) 5 MG tablet Take 5 mg by mouth 2 (two) times daily. (Patient not taking: Reported on 12/27/2020)     QUEtiapine (SEROQUEL) 25 MG tablet TAKE 1 TABLET BY MOUTH  EVERYDAY AT BEDTIME 90 tablet 4   traZODone (DESYREL) 100 MG tablet Take 100 mg by mouth at bedtime as needed.      Musculoskeletal: Strength & Muscle Tone: decreased Gait & Station:  did not observe. Pt in wheelchair Patient leans: N/A    Psychiatric Specialty Exam:  Presentation  General Appearance: Appropriate for Environment  Eye Contact:Fair  Speech:No data recorded Speech Volume:Normal  Handedness:No data recorded  Mood and Affect  Mood:Depressed  Affect:No data recorded  Thought Process  Thought Processes:Coherent  Descriptions of Associations:No data recorded Orientation:Partial  Thought Content:No data recorded History of Schizophrenia/Schizoaffective disorder:No data recorded Duration of Psychotic Symptoms:No data recorded Hallucinations:No data recorded Ideas of Reference:No data recorded Suicidal Thoughts:Suicidal Thoughts: No  Homicidal Thoughts:Homicidal Thoughts: No   Sensorium  Memory:Immediate Fair  Judgment:Poor  Insight:Poor   Executive Functions  Concentration:Fair  Attention Span:Fair  [place]   Psychomotor Activity  Psychomotor Activity:Psychomotor Activity: Normal   Assets  Assets:No data recorded  Sleep  Sleep:Sleep: Fair   Physical Exam: Physical Exam Vitals and nursing note reviewed.  HENT:     Head: Normocephalic.  Pulmonary:     Effort: Pulmonary effort is normal.  Musculoskeletal:        General: Normal range of motion.  Neurological:     Mental Status: She is alert.  Psychiatric:        Attention and Perception: Attention normal.        Mood and Affect: Mood is depressed.        Speech: Speech normal.        Behavior: Behavior is cooperative.        Judgment: Judgment is impulsive.     Comments: Dementia    Review of Systems  HENT: Negative.    Eyes: Negative.   Psychiatric/Behavioral:  Positive for depression.   Blood pressure 116/67, pulse (!)  109, temperature 99.1 F (37.3 C), temperature source Oral, resp. rate 18, height 5' (1.524 m), weight 90.7 kg, SpO2 94 %. Body mass index is 39.06 kg/m.  Treatment Plan Summary: Daily contact with patient to assess and evaluate symptoms and progress in treatment, Medication management, and Plan no medication changes at this time.Patient has Remeron, Seroquel, Trazodone already prescribed from her facility. Psychiatry can follow tomorrow to see if any adjustments need to be made. .   Disposition: No evidence of imminent risk to self or others at present.    Sherlon Handing, NP 12/28/2020 6:40 PM

## 2020-12-28 NOTE — ED Notes (Signed)
Pt refusing all PO meds stating "Im not taking any of that shit." The patient was educated on the importance of taking meds - she still refuses. Information provided to floor RN in report.

## 2020-12-28 NOTE — ED Notes (Signed)
Incontinent of stool. Peri care performed and brief changed. Patient yelling "Don't touch me! She's annoying! Leave me alone!" During care. Tele monitor placed on patient. Patient pulled pulse oximetry off. Daughter at bedside.

## 2020-12-28 NOTE — Progress Notes (Signed)
[place] for heparin infusion Indication: atrial fibrillation  Allergies  Allergen Reactions   Hydrochlorothiazide Other (See Comments)    Leg Cramps   Penicillins Rash    Patient Measurements: Height: 5' (152.4 cm) Weight: 90.7 kg (200 lb) IBW/kg (Calculated) : 45.5 Heparin Dosing Weight: 67 kg  Vital Signs: Temp: 98.8 F (37.1 C) (12/27 1926) Temp Source: Oral (12/27 1926) BP: 121/78 (12/27 2100) Pulse Rate: 108 (12/27 2100)  Labs: Recent Labs    12/27/20 1313 12/28/20 1735 12/28/20 1736  HGB 10.3* 9.3*  --   HCT 33.2* 30.4*  --   PLT 331 290  --   APTT  --   --  28  LABPROT  --   --  13.8  INR  --   --  1.1  CREATININE 1.89* 2.05*  --   TROPONINIHS 70*  --  53*     Estimated Creatinine Clearance: 23.4 mL/min (A) (by C-G formula based on SCr of 2.05 mg/dL (H)).   Medical History: Past Medical History:  Diagnosis Date   Aortic stenosis    Asthma    Coronary artery disease    Diabetes mellitus without complication (HCC)    History of measles    Hypertension    Nonischemic cardiomyopathy ([place])    Pulmonary hypertension (HCC)     Medications:  Per chart review, no anticoagulation prior to admission  Assessment: 75 yo female w/ h/o  nonobstructive CAD, HFimpEF 2/2 niCMP,AS, HTN, DM, and multiple myeloma who presented to ED with SOB. Pharmacy has been consulted for heparin dosing for Afib w RVR.   Date Time aPTT/HL Rate/Comment       Baseline labs: Hgb 10.3>9.3, HCT 33.2>30.4, Plt 331>290; aPTT 28s, PT-INR 1.1  Goal of Therapy:  Heparin level 0.3-0.7 units/ml Monitor platelets by anticoagulation protocol: Yes   Plan:  Pt was refusing meds/labs. Able to get IV access 2142 on 12/27 and heparin gtt started at that time. Give 3400 units bolus x 1; then start heparin infusion at 1000 units/hr Check anti-Xa level in 8 hours from infusion start and daily once consecutively therapeutic  Continue to monitor H&H and  platelets daily while on heparin gtt  Shanon Brow Stephanne Greeley 12/28/2020,9:48 PM

## 2020-12-28 NOTE — Evaluation (Signed)
Occupational Therapy Evaluation Patient Details Name: [name] MRN: [id number] DOB: 1945-08-22 Today's Date: 12/28/2020   History of Present Illness 75 y.o. female with medical history significant of CKD-IV, hypertension, hyperlipidemia, diet-controlled diabetes, asthma, depression, pulmonary hypertension, CAD, aortic stenosis, dCHF, multiple myeloma, dementia, depression, who presents with shortness of breath. On admission, pt was found to be COVID+ and hypotensive (blood pressure 80/38). Pt also was found to have new onset atrial fibrillation with RVR, heart rate up to 128.   Clinical Impression   Pt seen for OT evaluation this date. Mobility portion of session coordinated with PT to address pt's cognition/behavior during functional activity. At start of session, pt was alert and oriented to self and was aware she was in hospital. Daughter at bedside provided home set-up/PLOF in setting of pt with hx of cognitive impairments. At baseline, pt was independent with ADLs and required physical assist for stand pivot transfers to recliner/wheelchair/toilet. Pt has not walked in years. Since most recent hospital admission, pt was receiving OT/PT at SNF, and while she progressed to attempting sit<>stand transfers, pt required MAX-TOTAL A for ADLs. Pt currently requires MAX A+2 to roll to L side d/t decreased strength, impaired cognition, and R hip pain (of note, pt unable to achieve side-lying position d/t pt agitation). Pt refused to participate in bed-level ADLs this date, although this author anticipates pt requires MAX-TOTAL A for bed-level ADLs. Pt would benefit from additional skilled OT services to maximize return to PLOF and minimize risk of future falls, injury, caregiver burden, and readmission. Upon discharge, recommend SNF.      Recommendations for follow up therapy are one component of a multi-disciplinary discharge planning process, led by the attending physician.  Recommendations may be  updated based on patient status, additional functional criteria and insurance authorization.   Follow Up Recommendations  Skilled nursing-short term rehab (<3 hours/day)    Assistance Recommended at Discharge Frequent or constant Supervision/Assistance  Functional Status Assessment  Patient has had a recent decline in their functional status and/or demonstrates limited ability to make significant improvements in function in a reasonable and predictable amount of time  Equipment Recommendations  Other (comment) (defer to next venue of care)       Precautions / Restrictions Precautions Precautions: Fall Restrictions Weight Bearing Restrictions: No      Mobility Bed Mobility Overal bed mobility: Needs Assistance Bed Mobility: Rolling Rolling: Max assist;+2 for physical assistance         General bed mobility comments: Requires MAX A+2 to initiate rolling to L side. Unable to achieve side-lying position d/t pt agitation. Required +2 assist to scoot towards [facility]    Transfers                   General transfer comment: unsafe/unable to attend this date.          ADL either performed or assessed with clinical judgement   ADL                                         General ADL Comments: Refused to participate in bed-level ADLs this date, although anticipate pt requires MAX-TOTAL A for bed-level ADLs. Daughter at bedside reported that pt required physical assistance to eat this AM     Vision Baseline Vision/History: 1 Wears glasses Ability to See in Adequate Light: 0 Adequate Patient Visual Report: No change from baseline  Pertinent Vitals/Pain Pain Assessment: Faces Faces Pain Scale: Hurts even more Pain Location: R Hip Pain Pain Descriptors / Indicators: Grimacing;Aching Pain Intervention(s): Limited activity within patient's tolerance;Monitored during session;Repositioned        Extremity/Trunk Assessment Upper Extremity  Assessment Upper Extremity Assessment: Generalized weakness   Lower Extremity Assessment Lower Extremity Assessment: Generalized weakness       Communication Communication Communication: No difficulties   Cognition Arousal/Alertness: Awake/alert Behavior During Therapy: Agitated Overall Cognitive Status: History of cognitive impairments - at baseline                                 General Comments: Pt oriented to self and is aware in hospital (although thinks she is in [place] which is her baseline)        Exercises Other Exercises Other Exercises: Extensive discussion with family regarding POC and d/c recommendations, with daughter verbalizing understanding        Home Living Family/patient expects to be discharged to:: Private residence Living Arrangements: Children Available Help at Discharge: Family;Personal care attendant;Available 24 hours/day (states she has aide ~4 days a week for ~5 hours, doesn't seem sure) Type of Home: House Home Access: Ramped entrance     Home Layout: One level     Bathroom Shower/Tub: Sponge bathes at baseline         Home Equipment: Conservation officer, nature (2 wheels);Wheelchair - manual   Additional Comments: pt is poor historian however daughter is able to provide history at this time.  Pt sleeps in recliner at home. Has PCA when family not available.      Prior Functioning/Environment Prior Level of Function : Needs assist;Patient poor historian/Family not available       Physical Assist : Mobility (physical) Mobility (physical): Bed mobility;Transfers;Gait   Mobility Comments: Pt has not been ambulatory in years. At baseline, able to perform stand pivot transfers to/from wheelchair/recliner/toilet. While at SNF, pt was performing bed mobility and working on sit<>stand transfers ADLs Comments: At baseline, pt is independent with ADLs (with exception of requiring assist for toilet transfers). While at SNF, pt required MAX-TOTAL  assist for ADLs (including feeding)        OT Problem List: Decreased strength;Decreased activity tolerance;Pain;Impaired balance (sitting and/or standing);Decreased cognition      OT Treatment/Interventions: Self-care/ADL training;Therapeutic exercise;Patient/family education;Balance training    OT Goals(Current goals can be found in the care plan section) Acute Rehab OT Goals OT Goal Formulation: Patient unable to participate in goal setting Time For Goal Achievement: January 30, 2021 Potential to Achieve Goals: Fair  OT Frequency: Min 1X/week           Co-evaluation PT/OT/SLP Co-Evaluation/Treatment: Yes Reason for Co-Treatment: Necessary to address cognition/behavior during functional activity;For patient/therapist safety;To address functional/ADL transfers PT goals addressed during session: Mobility/safety with mobility OT goals addressed during session: ADL's and self-care      AM-PAC OT "6 Clicks" Daily Activity     Outcome Measure Help from another person eating meals?: A Lot Help from another person taking care of personal grooming?: A Lot Help from another person toileting, which includes using toliet, bedpan, or urinal?: Total Help from another person bathing (including washing, rinsing, drying)?: A Lot Help from another person to put on and taking off regular upper body clothing?: A Lot Help from another person to put on and taking off regular lower body clothing?: Total 6 Click Score: 10   End of Session Nurse Communication: Mobility  status  Activity Tolerance: Treatment limited secondary to agitation Patient left: in bed;with call bell/phone within reach;with family/visitor present  OT Visit Diagnosis: Unsteadiness on feet (R26.81);Muscle weakness (generalized) (M62.81);Pain Pain - Right/Left: Left Pain - part of body: Hip                Time: 1125-1206 OT Time Calculation (min): 41 min Charges:  OT General Charges $OT Visit: 1 Visit OT Evaluation $OT Eval  Moderate Complexity: 1 Mod OT Treatments $Therapeutic Activity: 8-22 mins  Fredirick Maudlin, OTR/L [place]

## 2020-12-28 NOTE — Progress Notes (Signed)
PROGRESS NOTE    [name]  VQQ:[id number] DOB: [birth year] DOA: 12/27/2020 PCP: Birdie Sons, MD   Brief Narrative: Taken from H&P. [name] is a 75 y.o. female with medical history significant of CKD-IV, hypertension, hyperlipidemia, diet-controlled diabetes, asthma, depression, pulmonary hypertension, CAD, aortic stenosis, dCHF, multiple myeloma, dementia, depression, who presents with shortness of breath. Patient with recent admission earlier this month (12/08/2020-12/15/2020) with recurrent UTIs and associated urinary frequency, anorexia, and agitation superimposed on baseline dementia.  She was discharged to SNF for rehab.  At baseline she is wheelchair dependent but able to transfer herself from bed to wheelchair, rehab purpuras was to go back to her baseline.  Daughter will not be able to help her if she cannot transfer herself.  Per daughter since discharge patient is experiencing worsening fatigue and refusing most of the care and p.o. intake.  For about 5 to 6 days she started refusing all of her medications.  She was tested positive for COVID-19 on 12/27/2020 first at her facility, later confirmed by PCR in ED.  She was also found to have new onset A. fib with RVR on arrival to ED. cardiology was consulted and she was converted to sinus rhythm this morning.  TSH levels pending as patient is refusing labs. CHA2DS2-VASc score of 6, she is currently on heparin infusion and will need NOAC before discharge but she should be able to take it regularly in order to get the benefit.  She was also found to have elevated troponin at 70.  Refusing further labs so no trend yet.  No chest pain.  Admitted for failure to thrive and new onset A. fib with RVR. Palliative care was also consulted.  Subjective: Patient was seen and examined today.  Daughter at bedside.  She was refusing all the medications and pulled her IVs.  She does not want any labs drawn.  Not answering any questions.   Seems annoyed when trying to do a physical exam. Per daughter she did ate a banana and some juice this morning.  Assessment & Plan:   Principal Problem:   Atrial fibrillation with RVR (HCC) Active Problems:   Type 2 diabetes mellitus with stage 4 chronic kidney disease (HCC)   Essential hypertension   Multiple myeloma not having achieved remission (HCC)   Chronic HFrEF (heart failure with reduced ejection fraction) (HCC)   CKD (chronic kidney disease), stage IV (HCC)   Suspected COVID-19 virus infection   Elevated troponin   Failure to thrive in adult   CAD (coronary artery disease)   Asthma   Dementia ([place])   Depression  New onset A. fib with RVR.  Heart rate still in low 100s but she converted to sinus rhythm on telemetry.  Appears to be in sinus tachycardia now.  Cardiology is on board. CHA2DS2-VASc score of 6. -Continue with heparin infusion for now-Will need NOAC before discharge if started taking p.o. and agrees to take medications. -Continue home metoprolol 75 mg twice daily. -TSH labs pending-patient is refusing lab draws.  COVID-19 infection.  Chest x-ray and procalcitonin negative for any acute findings.  Patient refusing remdesivir.  Paxlovid can be given if she agrees to take meds. -Continue to monitor  Elevated troponin/history of CAD.  Troponin at 70 and patient is refusing subsequent labs so there is no trend.  No chest pain, most likely demand ischemia with new onset A. fib and RVR. -Trend troponin if she allows. -Repeat echocardiogram -Continue home dose of aspirin  Essential  hypertension.  Blood pressure within goal now.  Initially softer blood pressure and her home meds were being held. -We can resume home lisinopril, amlodipine and Lasix as needed.  Chronic HFrEF with normalization of EF.  Recent echocardiogram done on 04/16/2019 with EF of 60 to 65% and grade 1 diastolic dysfunction.  History of nonischemic cardiomyopathy. Appears euvolemic and rather little  dry.  BNP at 128 -Holding home Lasix  CKD stage IV.  Creatinine around baseline.  Baseline between 1.4-2 -Monitor renal function -Avoid nephrotoxins  Multiple myeloma not having achieved remission ([place]) -Follow-up with oncology  Failure to thrive.  Started after prior discharge to rehab.  History of losing her 68 years of spouse 2 months ago.  Depression might be playing a role. -Continuing home Remeron and Seroquel -PT/OT recommending SNF -Palliative care consult -Dietitian consult  Asthma: stable -Bronchodilators   Dementia ([place]) -Donepezil   Depression -Continue home medications: Mirtazapine, Seroquel  Objective: Vitals:   12/28/20 0600 12/28/20 0700 12/28/20 1000 12/28/20 1100  BP: (!) 143/78 115/82 123/73 139/70  Pulse: (!) 104 (!) 116 (!) 108 (!) 109  Resp: 20 18 18 18   Temp:      TempSrc:      SpO2: 93% 93% 100% 96%  Weight:      Height:       No intake or output data in the 24 hours ending 12/28/20 1411 Filed Weights   12/27/20 1309  Weight: 90.7 kg    Examination:  General exam: Appears calm and comfortable  Respiratory system: Clear to auscultation. Respiratory effort normal. Cardiovascular system: S1 & S2 heard, RRR.  Gastrointestinal system: Soft, nontender, nondistended, bowel sounds positive. Central nervous system: Alert and oriented. No focal neurological deficits. Extremities: No edema, no cyanosis, pulses intact and symmetrical. Psychiatry: Judgement and insight appear impaired.  DVT prophylaxis: Heparin infusion Code Status: DNR Family Communication: Discussed with daughter at bedside. Disposition Plan:  Status is: Inpatient  Remains inpatient appropriate because: Severity of illness   Level of care: Telemetry Cardiac  All the records are reviewed and case discussed with Care Management/Social Worker. Management plans discussed with the patient, nursing and they are in agreement.  Consultants:  Palliative  care Cardiology  Procedures:  Antimicrobials:   Data Reviewed: I have personally reviewed following labs and imaging studies  CBC: Recent Labs  Lab 12/27/20 1313  WBC 8.9  HGB 10.3*  HCT 33.2*  MCV 91.5  PLT 774   Basic Metabolic Panel: Recent Labs  Lab 12/27/20 1313  NA 138  K 3.6  CL 106  CO2 26  GLUCOSE 168*  BUN 26*  CREATININE 1.89*  CALCIUM 9.1   GFR: Estimated Creatinine Clearance: 25.4 mL/min (A) (by C-G formula based on SCr of 1.89 mg/dL (H)). Liver Function Tests: Recent Labs  Lab 12/27/20 1313  AST 19  ALT 9  ALKPHOS 64  BILITOT 0.3  PROT 7.6  ALBUMIN 2.4*   No results for input(s): LIPASE, AMYLASE in the last 168 hours. No results for input(s): AMMONIA in the last 168 hours. Coagulation Profile: No results for input(s): INR, PROTIME in the last 168 hours. Cardiac Enzymes: No results for input(s): CKTOTAL, CKMB, CKMBINDEX, TROPONINI in the last 168 hours. BNP (last 3 results) No results for input(s): PROBNP in the last 8760 hours. HbA1C: No results for input(s): HGBA1C in the last 72 hours. CBG: No results for input(s): GLUCAP in the last 168 hours. Lipid Profile: No results for input(s): CHOL, HDL, LDLCALC, TRIG, CHOLHDL, LDLDIRECT in  the last 72 hours. Thyroid Function Tests: No results for input(s): TSH, T4TOTAL, FREET4, T3FREE, THYROIDAB in the last 72 hours. Anemia Panel: No results for input(s): VITAMINB12, FOLATE, FERRITIN, TIBC, IRON, RETICCTPCT in the last 72 hours. Sepsis Labs: Recent Labs  Lab 12/27/20 1313  PROCALCITON <0.10    Recent Results (from the past 240 hour(s))  Resp Panel by RT-PCR (Flu A&B, Covid) Nasopharyngeal Swab     Status: Abnormal   Collection Time: 12/27/20  5:29 PM   Specimen: Nasopharyngeal Swab; Nasopharyngeal(NP) swabs in vial transport medium  Result Value Ref Range Status   SARS Coronavirus 2 by RT PCR POSITIVE (A) NEGATIVE Final    Comment: (NOTE) SARS-CoV-2 target nucleic acids are  DETECTED.  The SARS-CoV-2 RNA is generally detectable in upper respiratory specimens during the acute phase of infection. Positive results are indicative of the presence of the identified virus, but do not rule out bacterial infection or co-infection with other pathogens not detected by the test. Clinical correlation with patient history and other diagnostic information is necessary to determine patient infection status. The expected result is Negative.  Fact Sheet for Patients: EntrepreneurPulse.com.au  Fact Sheet for Healthcare Providers: IncredibleEmployment.be  This test is not yet approved or cleared by the [place] FDA and  has been authorized for detection and/or diagnosis of SARS-CoV-2 by FDA under an Emergency Use Authorization (EUA).  This EUA will remain in effect (meaning this test can be used) for the duration of  the COVID-19 declaration under Section 564(b)(1) of the A ct, 21 U.S.C. section 360bbb-3(b)(1), unless the authorization is terminated or revoked sooner.     Influenza A by PCR NEGATIVE NEGATIVE Final   Influenza B by PCR NEGATIVE NEGATIVE Final    Comment: (NOTE) The Xpert Xpress SARS-CoV-2/FLU/RSV plus assay is intended as an aid in the diagnosis of influenza from Nasopharyngeal swab specimens and should not be used as a sole basis for treatment. Nasal washings and aspirates are unacceptable for Xpert Xpress SARS-CoV-2/FLU/RSV testing.  Fact Sheet for Patients: EntrepreneurPulse.com.au  Fact Sheet for Healthcare Providers: IncredibleEmployment.be  This test is not yet approved or cleared by the [place] FDA and has been authorized for detection and/or diagnosis of SARS-CoV-2 by FDA under an Emergency Use Authorization (EUA). This EUA will remain in effect (meaning this test can be used) for the duration of the COVID-19 declaration under Section 564(b)(1) of the Act, 21  U.S.C. section 360bbb-3(b)(1), unless the authorization is terminated or revoked.  Performed at [facility], [address] [place], [place] [address]      Radiology Studies: DG Chest 2 View  Result Date: 12/27/2020 CLINICAL DATA:  COVID-19 positive with decreased oxygen saturation. EXAM: CHEST - 2 VIEW COMPARISON:  12/11/2020 FINDINGS: Lateral view degraded by patient arm position. Midline trachea. Mild cardiomegaly. Atherosclerosis in the transverse aorta. Right paratracheal soft tissue fullness has been grossly similar back to 2017 and is likely due to prominent great vessels. No pleural effusion or pneumothorax. No congestive failure. Low lung volumes with resultant pulmonary interstitial prominence. IMPRESSION: Cardiomegaly and low lung volumes, without acute disease. Electronically Signed   By: Abigail Miyamoto M.D.   On: 12/27/2020 13:58    Scheduled Meds:  aspirin EC  81 mg Oral Daily   cholecalciferol  2,000 Units Oral Daily   donepezil  5 mg Oral QHS   heparin  3,400 Units Intravenous Once   metoprolol tartrate  75 mg Oral BID   mirtazapine  7.5 mg Oral QHS  nystatin  1 application Topical TID   QUEtiapine  25 mg Oral QHS   senna-docusate  1 tablet Oral BID   cyanocobalamin  1,000 mcg Oral Daily   Continuous Infusions:  heparin Stopped (12/27/20 1816)     LOS: 0 days   Time spent: 43 minutes. More than 50% of the time was spent in counseling/coordination of care  Lorella Nimrod, MD [facility]  If 7PM-7AM, please contact night-coverage [email or web address]  12/28/2020, 2:11 PM   This record has been created using Systems analyst. Errors have been sought and corrected,but may not always be located. Such creation errors do not reflect on the standard of care.

## 2020-12-28 NOTE — ED Notes (Signed)
RN at bedside to attempt to give meal tray again. Pt refusing meal at this time.

## 2020-12-28 NOTE — ED Notes (Signed)
Patient refused breakfast tray 

## 2020-12-28 NOTE — ED Notes (Signed)
Daughter at bedside. Pt agreeable to getting VS done. Per daughter pt was able to eat a few bites of banana

## 2020-12-28 NOTE — Consult Note (Signed)
Cardiology Consultation:   Patient ID: [name] MRN: [id number]; DOB: December 23, 1945  Admit date: 12/27/2020 Date of Consult: 12/28/2020  PCP:  Birdie Sons, MD   [facility] Providers Cardiologist:  Nelva Bush, MD      Patient Profile:   [name] is a 75 y.o. female with a hx of nonobstructive coronary artery disease, chronic HFrEF with recovered ejection fraction due to nonischemic cardiomyopathy, aortic stenosis, hypertension, diabetes mellitus, and multiple myeloma who is being seen 12/28/2020 for the evaluation of atrial fibrillation with rapid ventricular response at the request of Dr. Reesa Chew.  History of Present Illness:   [name] was hospitalized earlier this month (12/08/2020-12/15/2020) with recurrent UTIs and associated urinary frequency, anorexia, and agitation superimposed on baseline dementia.  She was transferred to SNF, where she was noted to be fatigued by her daughter who provides most of the history today.  [name] is unable to offer any specific complaints other than feeling tired.  She denies chest pain, shortness of breath, palpitations, and edema.  Her daughter notes that leg swelling has actually been well controlled with leg elevation.  About 5 or 6 days ago, [name] began refusing all of her medications.  She cannot offer a specific reason as to why she did not wish to take any medications.  She has continued to refuse medications in the emergency department.  [name] reportedly tested positive for COVID-19 at SNF yesterday.  This was confirmed by PCR in the emergency department.  Initial EKG yesterday showed atrial fibrillation with rapid ventricular response, which is a new diagnosis.  However, subsequent telemetry showed only sinus tachycardia with PACs.  [name] has now ripped off the telemetry leads.   Past Medical History:  Diagnosis Date   Aortic stenosis    Asthma    Coronary artery disease    Diabetes mellitus without  complication ([place])    History of measles    Hypertension    Nonischemic cardiomyopathy ([place])    Pulmonary hypertension ([place])     Past Surgical History:  Procedure Laterality Date   CARDIAC CATHETERIZATION     Cyst(solitary) of breast:removed     RIGHT/LEFT HEART CATH AND CORONARY ANGIOGRAPHY N/A 03/07/2016   Procedure: Right/Left Heart Cath and Coronary Angiography;  Surgeon: Nelva Bush, MD;  Location: [facility];  Service: Cardiovascular;  Laterality: N/A;   TUBAL LIGATION       Home Medications:  Prior to Admission medications   Medication Sig Start Date Jarelly Rinck Date Taking? Authorizing Provider  amLODipine (NORVASC) 5 MG tablet Take 5 mg by mouth daily.   Yes [provider]  Cholecalciferol (VITAMIN D) 50 MCG (2000 UT) tablet Take 2,000 Units by mouth daily.   Yes [provider]  cyanocobalamin 1000 MCG tablet Take 1,000 mcg by mouth daily.   Yes [provider]  lisinopril (ZESTRIL) 10 MG tablet TAKE 1 TABLET BY MOUTH EVERY DAY--DOSE INCREASE 08/31/20  Yes Mazen Marcin, Harrell Gave, MD  metoprolol tartrate (LOPRESSOR) 50 MG tablet Take 1.5 tablets (75 mg total) by mouth 2 (two) times daily. 11/17/20  Yes Sherran Margolis, Harrell Gave, MD  mirtazapine (REMERON) 7.5 MG tablet Take 7.5 mg by mouth at bedtime.   Yes [provider]  nystatin (MYCOSTATIN/NYSTOP) powder Apply 1 application topically 3 (three) times daily. Apply to areas under the breast 12/08/20  Yes Rada Hay, MD  polyethylene glycol (MIRALAX / GLYCOLAX) 17 g packet Take 17 g by mouth daily.   Yes [provider]  senna-docusate (SENOKOT-S) 8.6-50 MG tablet Take 1 tablet by mouth 2 (two) times daily.   Yes [provider]  albuterol (VENTOLIN HFA) 108 (90 Base) MCG/ACT inhaler INHALE 1-2 PUFFS INTO THE LUNGS EVERY 6 (SIX) HOURS AS NEEDED FOR SHORTNESS OF BREATH. 04/23/20   Birdie Sons, MD  donepezil (ARICEPT) 5 MG tablet Take 1 tablet by mouth at bedtime. 11/04/20  11/04/21  [provider]  furosemide (LASIX) 20 MG tablet TAKE 1 TABLET (20 MG TOTAL) BY MOUTH DAILY AS NEEDED FOR FLUID. 12/17/20   Birdie Sons, MD  levalbuterol (XOPENEX) 1.25 MG/3ML nebulizer solution USE 1 VIAL IN NEBULIZER EVERY 8 HOURS AS NEEDED FOR WHEEZING 05/14/20   Birdie Sons, MD  oxybutynin (DITROPAN) 5 MG tablet Take 5 mg by mouth 2 (two) times daily. Patient not taking: Reported on 12/27/2020 12/20/20   [provider]  QUEtiapine (SEROQUEL) 25 MG tablet TAKE 1 TABLET BY MOUTH EVERYDAY AT BEDTIME 12/17/20   Birdie Sons, MD  traZODone (DESYREL) 100 MG tablet Take 100 mg by mouth at bedtime as needed.    [provider]    Inpatient Medications: Scheduled Meds:  aspirin EC  81 mg Oral Daily   cholecalciferol  2,000 Units Oral Daily   donepezil  5 mg Oral QHS   heparin  3,400 Units Intravenous Once   metoprolol tartrate  75 mg Oral BID   mirtazapine  7.5 mg Oral QHS   nystatin  1 application Topical TID   QUEtiapine  25 mg Oral QHS   senna-docusate  1 tablet Oral BID   cyanocobalamin  1,000 mcg Oral Daily   Continuous Infusions:  heparin Stopped (12/27/20 1816)   PRN Meds: acetaminophen, albuterol, dextromethorphan-guaiFENesin, hydrALAZINE, ondansetron (ZOFRAN) IV, traZODone  Allergies:    Allergies  Allergen Reactions   Hydrochlorothiazide Other (See Comments)    Leg Cramps   Penicillins Rash    Social History:   Social History   Tobacco Use   Smoking status: Never   Smokeless tobacco: Never  Vaping Use   Vaping Use: Never used  Substance Use Topics   Alcohol use: No   Drug use: No     Family History:   Family History  Problem Relation Age of Onset   Cancer Mother        Uterine cancer   Diabetes Mother    Heart disease Mother    Heart failure Mother    Parkinson's disease Father    Cancer Sister        breast cancer   Hypertension Sister    Diabetes Sister        Non-insulin dependent Diabetes Mellitus    Diabetes Brother        Non-insulin Dependent Diabetes Mellitus   Cancer Brother    Mesothelioma Brother      ROS:  Please see the history of present illness.  All other ROS reviewed and negative.     Physical Exam/Data:   Vitals:   12/28/20 0600 12/28/20 0700 12/28/20 1000 12/28/20 1100  BP: (!) 143/78 115/82 123/73 139/70  Pulse: (!) 104 (!) 116 (!) 108 (!) 109  Resp: _0 Temp:      TempSrc:      SpO2: 93% 93% 100% 96%  Weight:      Height:       No intake or output data in the 24 hours ending 12/28/20 1242 Last 3 Weights 12/27/2020 12/14/2020 12/08/2020  Weight (  lbs) 200 lb 224 lb 13.9 oz 227 lb  Weight (kg) 90.719 kg 102 kg 102.967 kg     Body mass index is 39.06 kg/m.  General: Pale appearing elderly woman lying in bed.  Her daughter is at the bedside. HEENT: normal Neck: Unable to assess JVP due to body habitus and patient positioning. Vascular: No carotid bruits; 2+ radial pulses bilaterally. Cardiac: Tachycardic but regular with 2/6 systolic murmur. Lungs: Clear anteriorly.  Patient unable to sit up for posterior auscultation. Abd: soft, nontender, no hepatomegaly  Ext: Trace ankle edema. Musculoskeletal:  No deformities, BUE and BLE strength normal and equal Skin: warm and dry  Neuro: Grossly intact.  Patient moves upper and lower extremities. Psych: Flat affect, only intermittently responding to questions.  EKG:  The EKG was personally reviewed and demonstrates: Atrial fibrillation with rapid ventricular response (ventricular rate 120 bpm) and inferolateral ST/T changes Telemetry:  Telemetry was personally reviewed and demonstrates: Sinus tachycardia with PACs.  Relevant CV Studies: Transthoracic echocardiogram (04/18/2019):  1. Left ventricular ejection fraction, by estimation, is 60 to 65%. The  left ventricle has normal function. The left ventricle has no regional  wall motion abnormalities. There is mild left ventricular hypertrophy.  Left  ventricular diastolic parameters  are consistent with Grade I diastolic dysfunction (impaired relaxation).   2. Right ventricular systolic function is normal. The right ventricular  size is normal. There is mildly elevated pulmonary artery systolic  pressure.   3. The aortic valve was not well visualized, though grossly there is  significant calcification. Mild aortic valve stenosis.   Laboratory Data:  High Sensitivity Troponin:   Recent Labs  Lab 12/27/20 1313  TROPONINIHS 70*     Chemistry Recent Labs  Lab 12/27/20 1313  NA 138  K 3.6  CL 106  CO2 26  GLUCOSE 168*  BUN 26*  CREATININE 1.89*  CALCIUM 9.1  GFRNONAA 27*  ANIONGAP 6    Recent Labs  Lab 12/27/20 1313  PROT 7.6  ALBUMIN 2.4*  AST 19  ALT 9  ALKPHOS 64  BILITOT 0.3   Lipids No results for input(s): CHOL, TRIG, HDL, LABVLDL, LDLCALC, CHOLHDL in the last 168 hours.  Hematology Recent Labs  Lab 12/27/20 1313  WBC 8.9  RBC 3.63*  HGB 10.3*  HCT 33.2*  MCV 91.5  MCH 28.4  MCHC 31.0  RDW 14.1  PLT 331   Thyroid No results for input(s): TSH, FREET4 in the last 168 hours.  BNP Recent Labs  Lab 12/27/20 1313  BNP 128.9*    DDimer No results for input(s): DDIMER in the last 168 hours.   Radiology/Studies:  DG Chest 2 View  Result Date: 12/27/2020 CLINICAL DATA:  COVID-19 positive with decreased oxygen saturation. EXAM: CHEST - 2 VIEW COMPARISON:  12/11/2020 FINDINGS: Lateral view degraded by patient arm position. Midline trachea. Mild cardiomegaly. Atherosclerosis in the transverse aorta. Right paratracheal soft tissue fullness has been grossly similar back to 2017 and is likely due to prominent great vessels. No pleural effusion or pneumothorax. No congestive failure. Low lung volumes with resultant pulmonary interstitial prominence. IMPRESSION: Cardiomegaly and low lung volumes, without acute disease. Electronically Signed   By: Abigail Miyamoto M.D.   On: 12/27/2020 13:58     Assessment and  Plan:   Paroxysmal atrial fibrillation: New diagnosis for Ms. Grist, with presenting EKG today confirming atrial fibrillation with rapid ventricular response.  However, review of available telemetry in the emergency department shows only sinus tachycardia with PACs.  I suspect that stress from COVID-19 infection as well as recent refusal to take any medications may have contributed to atrial fibrillation. -Continue metoprolol tartrate 75 mg twice daily. -Okay to continue with IV heparin for now.  Ideally, would transition to NOAC prior to discharge in the setting of a CHA2DS2-VASc score of at least 6, though patient would need to be agreeable with taking her medications regularly. -Repeat echocardiogram. -Obtain TSH.  Chronic HFrEF with recovered ejection fraction: [name] appears euvolemic on examination today.  She does not endorse any specific heart failure symptoms.  Her fatigue could be due to a number of factors including deconditioning from recent hospitalization, COVID-19 infection, and underlying heart failure.  Unable to assess functional capacity due to limited mobility and AMS. -Maintain net even fluid balance. -Continue with metoprolol.  Aortic stenosis: Mild by most recent echo in 04/2019. -Repeat echocardiogram.  Nonobstructive coronary artery disease: No angina reported, though obtaining a complete history is challenging due to the patient's confusion superimposed on baseline dementia.  Cath in 2018 showed mild-moderate nonobstructive CAD with up to 40% stenoses involving the mid LAD and LCx.  HS-TnI 70 on single check yesterday. -Repeat HS-TnI.  If flat/downtrending, would not pursue further ischemia evaluation at this time unless new cardiomyopathy is evident. -Medical therapy to prevent progression of disease, if the patient is amenable in the coming days.    For questions or updates, please contact [place] Please consult [email or web address] for contact info under [facility]  Cardiology.  Signed, Nelva Bush, MD  12/28/2020 12:42 PM

## 2020-12-28 NOTE — ED Notes (Signed)
RN at bedside. Upon RN entering room pt stating to RN, "I do not need anything." RN stating to pt to use call bell if a need is to arise.

## 2020-12-28 NOTE — Evaluation (Signed)
Physical Therapy Evaluation Patient Details Name: [name] MRN: [id number] DOB: Nov 01, 1945 Today's Date: 12/28/2020  History of Present Illness  75 y.o. female with medical history significant of CKD-IV, hypertension, hyperlipidemia, diet-controlled diabetes, asthma, depression, pulmonary hypertension, CAD, aortic stenosis, dCHF, multiple myeloma, dementia, depression, who presents with shortness of breath. On admission, pt was found to be COVID+ and hypotensive (blood pressure 80/38). Pt also was found to have new onset atrial fibrillation with RVR, heart rate up to 128.    Clinical Impression  Pt received in supine position and somewhat agreeable for therapy.  Pt's daughter present in room throughout session.  Pt responded well to therapist at first, but following interjection from MD, and return of PT to room after meeting with OT, pt became agitated and not willing to work with therapy as expected.  Pt has not been able to walk in years, but has been able to perform Stand-Pivot transfers to the wheelchair and to the toilet.  Pt refused to participate in any bed-level activities/mobility this date, although it is expected she will require max-totalA for any mobility at this time due to weakness/pain.  Upon discussion with family members, current discharge recommendation to SNF remain appropriate at this time.  Pt will continue to benefit from skilled therapy in order to address deficits listed below.      Recommendations for follow up therapy are one component of a multi-disciplinary discharge planning process, led by the attending physician.  Recommendations may be updated based on patient status, additional functional criteria and insurance authorization.  Follow Up Recommendations Skilled nursing-short term rehab (<3 hours/day)    Assistance Recommended at Discharge Frequent or constant Supervision/Assistance  Functional Status Assessment Patient has had a recent decline in their  functional status and demonstrates the ability to make significant improvements in function in a reasonable and predictable amount of time.  Equipment Recommendations  Hospital bed    Recommendations for Other Services       Precautions / Restrictions Precautions Precautions: Fall Restrictions Weight Bearing Restrictions: No      Mobility  Bed Mobility Overal bed mobility: Needs Assistance Bed Mobility: Rolling Rolling: Max assist;+2 for physical assistance         General bed mobility comments: Requires MAX A+2 to initiate rolling to L side. Unable to achieve side-lying position d/t pt agitation. Required +2 assist to scoot towards [facility]    Transfers                   General transfer comment: unsafe/unable to attend this date.    Ambulation/Gait                  Stairs            Wheelchair Mobility    Modified Rankin (Stroke Patients Only)       Balance                                             Pertinent Vitals/Pain Pain Assessment: Faces Faces Pain Scale: Hurts even more Pain Location: R Hip Pain Pain Descriptors / Indicators: Grimacing;Aching Pain Intervention(s): Limited activity within patient's tolerance;Monitored during session;Repositioned    Home Living Family/patient expects to be discharged to:: Private residence Living Arrangements: Children Available Help at Discharge: Family;Personal care attendant;Available 24 hours/day (states she has aide ~4 days a week for ~5 hours, doesn't  seem sure) Type of Home: House Home Access: Ramped entrance       Home Layout: One level Home Equipment: Conservation officer, nature (2 wheels);Wheelchair - manual Additional Comments: pt is poor historian however daughter is able to provide history at this time.  Pt sleeps in recliner at home. Has PCA when family not available.    Prior Function Prior Level of Function : Needs assist;Patient poor historian/Family not available        Physical Assist : Mobility (physical) Mobility (physical): Bed mobility;Transfers;Gait   Mobility Comments: Pt has not been ambulatory in years. At baseline, able to perform stand pivot transfers to/from wheelchair/recliner/toilet. While at SNF, pt was performing bed mobility and working on sit<>stand transfers ADLs Comments: At baseline, pt is independent with ADLs (with exception of requiring assist for toilet transfers). While at SNF, pt required MAX-TOTAL assist for ADLs (including feeding)     Hand Dominance        Extremity/Trunk Assessment   Upper Extremity Assessment Upper Extremity Assessment: Generalized weakness    Lower Extremity Assessment Lower Extremity Assessment: Generalized weakness       Communication   Communication: No difficulties  Cognition Arousal/Alertness: Awake/alert Behavior During Therapy: Agitated Overall Cognitive Status: History of cognitive impairments - at baseline                                 General Comments: Pt oriented to self and is aware in hospital (although thinks she is in [place] which is her baseline)        General Comments      Exercises Other Exercises Other Exercises: Extensive discussion with family regarding POC and d/c recommendations, with daughter verbalizing understanding   Assessment/Plan    PT Assessment Patient needs continued PT services  PT Problem List Decreased strength;Decreased activity tolerance;Decreased balance;Decreased mobility;Cardiopulmonary status limiting activity;Obesity       PT Treatment Interventions Gait training;Therapeutic exercise;Balance training;DME instruction    PT Goals (Current goals can be found in the Care Plan section)  Acute Rehab PT Goals Patient Stated Goal: not able to form a goal. PT Goal Formulation: With patient Time For Goal Achievement: 2021/02/04    Frequency Min 2X/week   Barriers to discharge Inaccessible home environment      Co-evaluation  PT/OT/SLP Co-Evaluation/Treatment: Yes Reason for Co-Treatment: Necessary to address cognition/behavior during functional activity;For patient/therapist safety;To address functional/ADL transfers PT goals addressed during session: Mobility/safety with mobility OT goals addressed during session: ADL's and self-care       AM-PAC PT "6 Clicks" Mobility  Outcome Measure Help needed turning from your back to your side while in a flat bed without using bedrails?: A Lot Help needed moving from lying on your back to sitting on the side of a flat bed without using bedrails?: Total Help needed moving to and from a bed to a chair (including a wheelchair)?: Total Help needed standing up from a chair using your arms (e.g., wheelchair or bedside chair)?: Total Help needed to walk in hospital room?: Total Help needed climbing 3-5 steps with a railing? : Total 6 Click Score: 7    End of Session   Activity Tolerance: Treatment limited secondary to agitation;Patient limited by pain Patient left: in bed;with call bell/phone within reach;with bed alarm set Nurse Communication: Mobility status PT Visit Diagnosis: Unsteadiness on feet (R26.81);Muscle weakness (generalized) (M62.81);Difficulty in walking, not elsewhere classified (R26.2)    Time: 1950-9326 PT Time Calculation (  min) (ACUTE ONLY): 60 min   Charges:   PT Evaluation $PT Eval Moderate Complexity: 1 Mod PT Treatments $Therapeutic Activity: 8-22 mins        Gwenlyn Saran, PT, DPT 12/28/20, 1:44 PM   Christie Nottingham 12/28/2020, 1:32 PM

## 2020-12-28 NOTE — ED Notes (Signed)
Patient incontinent of stool. Attempted to refuse to be changed. Pericare performed, clean brief and external female foley applied. Patient yelling at staff, swatting at staff's hands during care. Patient continues to refuse to allow staff to place IV. Patient with very low UOP, approximately 100 ml.  Unable to send specimen as it was contaminated with stool. Patient refuses to eat or drink. Daughter at bedside.

## 2020-12-28 NOTE — ED Notes (Signed)
Dr. Mardee Postin messaged via secure chat to notify that patient has had 100 ml UOP and refuses IV, all treatment and refuses to eat or drink.

## 2020-12-28 NOTE — ED Notes (Signed)
RN attempting to start IV for blood work and medication administration. Pt refusing at this time. MD aware.

## 2020-12-28 NOTE — ED Notes (Signed)
Pt refused her dinner tray.

## 2020-12-28 NOTE — ED Notes (Signed)
Pt is verbally aggressive with the dayshift team as well as this RN. The patient has successfully removed 3 Ivs that were placed over the last day. The patient agreed to allow this RN to place and IV. However refused all other medications. [place] applied to the pt at [place].

## 2020-12-28 NOTE — ED Notes (Signed)
Patient resting quietly in bed with eyes closed. Resp even, unlabored on RA. No distress noted at this time. Daughter at bedside.

## 2020-12-28 NOTE — ED Notes (Signed)
Day shift RN contacted the attending physician to inquire about restarting the patients heparin - no response at this time. This RN will follow up.

## 2020-12-29 DIAGNOSIS — U071 COVID-19: Secondary | ICD-10-CM

## 2020-12-29 DIAGNOSIS — F03C0 Unspecified dementia, severe, without behavioral disturbance, psychotic disturbance, mood disturbance, and anxiety: Secondary | ICD-10-CM

## 2020-12-29 DIAGNOSIS — Z515 Encounter for palliative care: Secondary | ICD-10-CM

## 2020-12-29 DIAGNOSIS — I2510 Atherosclerotic heart disease of native coronary artery without angina pectoris: Secondary | ICD-10-CM | POA: Diagnosis not present

## 2020-12-29 DIAGNOSIS — Z66 Do not resuscitate: Secondary | ICD-10-CM

## 2020-12-29 DIAGNOSIS — R4589 Other symptoms and signs involving emotional state: Secondary | ICD-10-CM | POA: Diagnosis not present

## 2020-12-29 DIAGNOSIS — I4891 Unspecified atrial fibrillation: Secondary | ICD-10-CM | POA: Diagnosis not present

## 2020-12-29 LAB — GLUCOSE, CAPILLARY: Glucose-Capillary: 100 mg/dL — ABNORMAL HIGH (ref 70–99)

## 2020-12-29 LAB — BLOOD CULTURE ID PANEL (REFLEXED) - BCID2

## 2020-12-29 LAB — CBC
HCT: 28.3 % — ABNORMAL LOW (ref 36.0–46.0)
Hemoglobin: 8.8 g/dL — ABNORMAL LOW (ref 12.0–15.0)
MCH: 27.8 pg (ref 26.0–34.0)
MCHC: 31.1 g/dL (ref 30.0–36.0)
MCV: 89.3 fL (ref 80.0–100.0)
Platelets: 255 10*3/uL (ref 150–400)
RBC: 3.17 MIL/uL — ABNORMAL LOW (ref 3.87–5.11)
RDW: 14.3 % (ref 11.5–15.5)
WBC: 9.2 10*3/uL (ref 4.0–10.5)
nRBC: 0 % (ref 0.0–0.2)

## 2020-12-29 LAB — HEPARIN LEVEL (UNFRACTIONATED): Heparin Unfractionated: 0.44 IU/mL (ref 0.30–0.70)

## 2020-12-29 LAB — URINE CULTURE

## 2020-12-29 MED ORDER — MOLNUPIRAVIR EUA 200MG CAPSULE
4.0000 | ORAL_CAPSULE | Freq: Two times a day (BID) | ORAL | Status: DC
  Filled 2020-12-29: qty 4

## 2020-12-29 MED ORDER — RIVAROXABAN 15 MG PO TABS
15.0000 mg | ORAL_TABLET | Freq: Every day | ORAL | Status: DC
  Administered 2020-12-29: 13:00:00 15 mg via ORAL
  Filled 2020-12-29 (×2): qty 1

## 2020-12-29 MED ORDER — CHLORHEXIDINE GLUCONATE 0.12 % MT SOLN
15.0000 mL | Freq: Two times a day (BID) | OROMUCOSAL | Status: DC
  Filled 2020-12-29: qty 15

## 2020-12-29 MED ORDER — ORAL CARE MOUTH RINSE: 15.0000 mL | Freq: Two times a day (BID) | OROMUCOSAL | Status: DC

## 2020-12-29 MED ORDER — ACETAMINOPHEN 650 MG RE SUPP
650.0000 mg | Freq: Four times a day (QID) | RECTAL | Status: DC | PRN
  Administered 2020-12-29: 06:00:00 650 mg via RECTAL
  Filled 2020-12-29 (×2): qty 1

## 2020-12-29 MED ORDER — APIXABAN 5 MG PO TABS: 5.0000 mg | ORAL_TABLET | Freq: Two times a day (BID) | ORAL | Status: DC

## 2020-12-29 NOTE — TOC Initial Note (Signed)
Transition of Care [facility]) - Initial/Assessment Note    Patient Details  Name: [name] MRN: [id number] Date of Birth: 1945-04-24  Transition of Care [facility]) CM/SW Contact:    Alberteen Sam, LCSW Phone Number: 12/29/2020, 10:48 AM  Clinical Narrative:                  CSW spoke with patient's daughter Feeser regarding SNF rec. She is in agreement, reports patient was at [facility] for short term rehab however would like to explore other options for short term rehab.   CSW will send out referrals for bed offers.   Paone expressed concerns regarding [facility] and patient's medications, in which she reports they were giving her Trazodone 100 mg daily to sedate her however it should be PRN. Also expressed concerns with new medication on the list she reports is Remeron, states she will call patient's PCP Dr. Caryn Section to see if he agrees with this.   Hino reports when patient was at home she did well with Trazodone PRN and taking her Aricept 26m.   Expected Discharge Plan: Skilled Nursing Facility Barriers to Discharge: Continued Medical Work up   Patient Goals and CMS Choice   CMS Medicare.gov Compare Post Acute Care list provided to:: Patient Represenative (must comment) (daughter CShaw Choice offered to / list presented to : Adult Children  Expected Discharge Plan and Services Expected Discharge Plan: [place] for the past 2 [place]: [place])                                      Prior Living Arrangements/Services Living arrangements for the past 2 months: [place]([facility]) Lives with:: Facility Resident                   Activities of Daily Living      Permission Sought/Granted                  Emotional Assessment         Alcohol / Substance Use: Not Applicable Psych Involvement: No (comment)  Admission diagnosis:  Inanition ([place] [R64] Hypoxia [R09.02] Atrial  fibrillation with RVR ([place] [I48.91] COVID [U07.1] Patient Active Problem List   Diagnosis Date Noted   Atrial fibrillation with RVR (HMcGrath 12/27/2020   Suspected COVID-19 virus infection 12/27/2020   Elevated troponin 12/27/2020   Failure to thrive in adult 12/27/2020   CAD (coronary artery disease) 12/27/2020   Asthma 12/27/2020   Dementia ([place] 12/27/2020   Depression 12/27/2020   Ambulatory dysfunction    Pyelonephritis    Acute UTI 12/12/2020   History of multiple myeloma 10/06/2019   CKD (chronic kidney disease), stage IV (HEwing 04/10/2019   Chronic HFrEF (heart failure with reduced ejection fraction) (HE[place]mondson 09/27/2017   Urinary frequency 09/27/2017   Chronic kidney disease, stage 3b (HSullivan 03/15/2017   B12 deficiency 03/01/2017   Lymphedema 02/14/2017   Chronic venous insufficiency 02/14/2017   Nonischemic cardiomyopathy (HW[place] 06/14/2016   CAD in native artery 06/14/2016   Bilateral lower extremity edema 06/14/2016   Multiple myeloma not having achieved remission (HDeaf Schuyler 03/26/2016   Back pain 03/26/2016   Diverticulosis 03/20/2016   Atherosclerosis of aorta (HUniversity of California-Davis 03/20/2016   Congestive heart failure ([place] 03/07/2016   Monoclonal gammopathy 01/25/2016   Chronic anemia 01/25/2016   LVH (left ventricular hypertrophy) 08/24/2015  Mild aortic stenosis 07/14/2015   Urinary incontinence 03/02/2015   Diabetic retinopathy with macular edema ([place]) 02/19/2015   Constipation 12/02/2014   Osteoarthritis 07/31/2014   Insomnia 06/30/2014   Bleeding internal hemorrhoids 06/30/2014   Osteopenia 06/30/2014   Fecal occult blood test positive 06/30/2014   Vitamin D deficiency 04/29/2009   Type 2 diabetes mellitus with stage 4 chronic kidney disease ([place]) 01/28/2009   Intertrigo 01/03/2008   Hyperlipidemia 04/02/2006   Chronic edema 08/11/2003   BMI 40.0-44.9, adult ([place]) 09/22/2002   Intermittent asthma, uncontrolled 08/31/2000   Essential hypertension 08/31/2000   Leg varices  04/18/1999   PCP:  Birdie Sons, MD Pharmacy:   [id number] [place] [place] [id number]: [phone]: [phone]    Social Determinants of Health (SDOH) Interventions    Readmission Risk Interventions No flowsheet data found.

## 2020-12-29 NOTE — Progress Notes (Signed)
Patient refused evening medications, evening assessment, and 2000 vitals. MD notified.

## 2020-12-29 NOTE — Consult Note (Signed)
Consultation Note Date: 12/29/2020   Patient Name: [name]  DOB: 03/04/1945  MRN: [id number]  Age / Sex: 75 y.o., female  PCP: Birdie Sons, MD Referring Physician: Lorella Nimrod, MD  Reason for Consultation: Establishing goals of care  HPI/Patient Profile: 75 y.o. female  with past medical history of CKD, HLD, type 2 diabetes (diet-controlled), asthma, depression, pulmonary hypertension, CAD, aortic stenosis, diastolic CHF, multiple myeloma, and dementia admitted on 12/27/2020 with shortness of breath.  Patient arrived from [facility] and was COVID-positive.  First COVID-positive test is noted to be 12/26.  Patient also found to be in A. fib with RVR and overall failure to thrive.  Palliative medicine team was consulted to discuss goals of care.  Patient is familiar to PMT as patient was seen during her last hospitalization (12/7-12/14) by my colleague Quinn Axe.   Clinical Assessment and Goals of Care: I have reviewed medical records including EPIC notes, labs and imaging, assessed the patient and then met with patient at bedside  to discuss diagnosis prognosis, GOC, EOL wishes, disposition and options. Pt was not interactive and answers only two questions before closing her eyes, "snoring", and not longer participating in our discussion.  I spoke with patient's daughter/HCPOA Wirsing (goes by Olivia Mackie) over the phone.  I introduced Palliative Medicine as specialized medical care for people living with serious illness. It focuses on providing relief from the symptoms and stress of a serious illness. The goal is to improve quality of life for both the patient and the family.  We discussed a brief life review of the patient.  Patient was married for 87 years to her husband (whom she met when she was 56).  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  Daughter shares that patient does well when she is at home but after her last hospitalization that rehabilitation was needed for PT.  As far as functional and nutritional status daughter endorses that patient was not eating at the rehab facility unless a familiar family member was there.  Then, she would only take a few bites of her meals.  We discussed patient's current illness and what it means in the larger context of patient's on-going co-morbidities.  Natural disease trajectory and expectations at EOL were discussed.  I attempted to elicit values and goals of care important to the patient.  Daughter shares that she wants her mother to be kept comfortable and does not want to do anything to make her suffer. The difference between aggressive medical intervention and comfort care was considered in light of the patient's goals of care.   Education offered regarding concept specific to human mortality and the limitations of medical interventions to prolong life when the body begins to fail to thrive.  We discussed that dementia is a progressive and terminal disease that often is a "slow bleed" that then has a significant and rapid decline.    I  shared that I believe her mother is at the end stages of dementia.  I reviewed her functional, nutritional, and cognitive status has significantly declined since her previous admission.  Daughter agrees.  Daughter shared she does not want her mother to pursue placement at a rehab facility.  She would like to have her mother either return home with hospice or be considered for the hospice home.  Daughter asked for time to speak with her husband, patient's sister, and her family regarding what the next step would be best for the patient.  Hospice at home and hospice inpatient unit described in detail.  I made it clear that should the patient want to be admitted to the hospice home she would need to be evaluated as well  as clear 10 days of isolation for COVID.   Discussed with patient/family the importance of continued conversation with family and the medical providers regarding overall plan of care and treatment options, ensuring decisions are within the context of the patients values and GOCs.    Questions and concerns were addressed. The family was encouraged to call with questions or concerns.  I will continue to follow patient throughout her hospitalization and touch base with the daughter in the morning to see what decisions have been made.  Primary Decision Maker HCPOA  Code Status/Advance Care Planning: DNR  Prognosis:   < 2 weeks  Discharge Planning: To Be Determined  Primary Diagnoses: Present on Admission:  Atrial fibrillation with RVR ([place])  Suspected COVID-19 virus infection  Elevated troponin  Failure to thrive in adult  Chronic HFrEF (heart failure with reduced ejection fraction) (HCC)  CKD (chronic kidney disease), stage IV (HCC)  Essential hypertension  Type 2 diabetes mellitus with stage 4 chronic kidney disease (HCC)  CAD (coronary artery disease)  Asthma  Dementia ([place])  Depression  Multiple myeloma not having achieved remission [facility])   Physical Exam Vitals and nursing note reviewed.  Constitutional:      General: She is not in acute distress.    Appearance: She is not ill-appearing or toxic-appearing.  HENT:     Head: Normocephalic.  Cardiovascular:     Rate and Rhythm: Normal rate.  Pulmonary:     Effort: Pulmonary effort is normal.  Abdominal:     Palpations: Abdomen is soft.  Musculoskeletal:     Cervical back: Normal range of motion.     Comments: Generalized weakness Bilateral mitts in place  Skin:    General: Skin is warm and dry.  Neurological:     Mental Status: She is alert.     Comments: nonverbal  Psychiatric:        Mood and Affect: Mood is not anxious.        Behavior: Behavior is not agitated.    Vital Signs: BP 131/74 (BP Location:  Left Leg)    Pulse 88    Temp 97.9 F (36.6 C)    Resp 18    Ht 5' (1.524 m)    Wt 86 kg    SpO2 98%    BMI 37.03 kg/m  Pain Scale: 0-10   Pain Score: 0-No pain SpO2: SpO2: 98 % O2 Device:SpO2: 98 % O2 Flow Rate: .   Palliative Assessment/Data: 20%     I discussed this patient's plan of care with patient, patient's daughter/HCPOA, Dr. Reesa Chew, TOC SW [place].  Thank you for this consult. Palliative medicine will continue to follow and assist holistically.   Time Total: 70 minutes Greater than 50%  of this time was spent counseling and coordinating care related to the above assessment and plan.  Signed by: Jordan Hawks, DNP, FNP-BC Palliative Medicine    Please contact Palliative Medicine Team phone at [phone] for questions and concerns.  For individual provider: See Shea Evans

## 2020-12-29 NOTE — TOC Progression Note (Signed)
Transition of Care [facility]) - Progression Note    Patient Details  Name: [name] MRN: [id number] Date of Birth: 1945-05-12  Transition of [facility]) CM/SW [place], [place] Phone Number: 12/29/2020, 1:45 PM  Clinical Narrative:     CSW to hold on SNF placement process.   Per Palliative, after consultation daughter shared she does not want her mother to pursue placement at a rehab facility.  She would like to have her mother either return home with hospice or be considered for the hospice home.    Daughter to speak with her husband, patient's sister, and her family regarding what the next step would be best for the patient.  TOC will continue to follow for discharge planning  Expected Discharge Plan: [place] Barriers to Discharge: Continued Medical Work up  Expected Discharge Plan and Services Expected Discharge Plan: [place] arrangements for the past 2 months: [place] ([facility])                                       Social Determinants of Health (SDOH) Interventions    Readmission Risk Interventions No flowsheet data found.

## 2020-12-29 NOTE — Progress Notes (Signed)
.  bciPHARMACY - PHYSICIAN COMMUNICATION CRITICAL VALUE ALERT - BLOOD CULTURE IDENTIFICATION (BCID)  [name] is an 75 y.o. female w/ h/o CKD-IV, HTN, HLD, diet-controlled DM, asthma, depression, pulmonary hypertension, CAD, AS, dCHF, multiple myeloma, dementia, depression who presented to [facility] on 12/27/2020 with a chief complaint of SOB found to be Covid+.   Assessment:  Staphylococcus epidermidis in 1 of 4 vials (aero only / Mec-A/C resistance). This likely represents contaminant.   Per d/w MD: will repeat blood cultures to reassess.  Pt is Covid+ on molnupiravir; not currently on abx and PCT <0.1, WBC WNL. Watch w/o abx at this time, if concerned for true bacteremia we can consider vancomycin.   Name of physician (or Provider) Contacted: Dr. Reesa Chew  Current antibiotics: No current abx. On molnupiravir for Covid.  Changes to prescribed antibiotics recommended:  Will repeat cultures and watch w/o abx at this time.  Results for orders placed or performed during the hospital encounter of 12/27/20  Blood Culture ID Panel (Reflexed) (Collected: 12/28/2020  5:35 PM)  Result Value Ref Range   Enterococcus faecalis NOT DETECTED NOT DETECTED   Enterococcus Faecium NOT DETECTED NOT DETECTED   Listeria monocytogenes NOT DETECTED NOT DETECTED   Staphylococcus species DETECTED (A) NOT DETECTED   Staphylococcus aureus (BCID) NOT DETECTED NOT DETECTED   Staphylococcus epidermidis DETECTED (A) NOT DETECTED   Staphylococcus lugdunensis NOT DETECTED NOT DETECTED   Streptococcus species NOT DETECTED NOT DETECTED   Streptococcus agalactiae NOT DETECTED NOT DETECTED   Streptococcus pneumoniae NOT DETECTED NOT DETECTED   Streptococcus pyogenes NOT DETECTED NOT DETECTED   A.calcoaceticus-baumannii NOT DETECTED NOT DETECTED   Bacteroides fragilis NOT DETECTED NOT DETECTED   Enterobacterales NOT DETECTED NOT DETECTED   Enterobacter cloacae complex NOT DETECTED NOT DETECTED   Escherichia coli NOT  DETECTED NOT DETECTED   Klebsiella aerogenes NOT DETECTED NOT DETECTED   Klebsiella oxytoca NOT DETECTED NOT DETECTED   Klebsiella pneumoniae NOT DETECTED NOT DETECTED   Proteus species NOT DETECTED NOT DETECTED   Salmonella species NOT DETECTED NOT DETECTED   Serratia marcescens NOT DETECTED NOT DETECTED   Haemophilus influenzae NOT DETECTED NOT DETECTED   Neisseria meningitidis NOT DETECTED NOT DETECTED   Pseudomonas aeruginosa NOT DETECTED NOT DETECTED   Stenotrophomonas maltophilia NOT DETECTED NOT DETECTED   Candida albicans NOT DETECTED NOT DETECTED   Candida auris NOT DETECTED NOT DETECTED   Candida glabrata NOT DETECTED NOT DETECTED   Candida krusei NOT DETECTED NOT DETECTED   Candida parapsilosis NOT DETECTED NOT DETECTED   Candida tropicalis NOT DETECTED NOT DETECTED   Cryptococcus neoformans/gattii NOT DETECTED NOT DETECTED   Methicillin resistance mecA/C DETECTED (A) NOT DETECTED    [name] 12/29/2020  4:16 PM

## 2020-12-29 NOTE — Progress Notes (Signed)
ANTICOAGULATION CONSULT NOTE  Pharmacy Consult for heparin infusion Indication: atrial fibrillation  Allergies  Allergen Reactions   Hydrochlorothiazide Other (See Comments)    Leg Cramps   Penicillins Rash    Patient Measurements: Height: 5' (152.4 cm) Weight: 86 kg (189 lb 9.5 oz) IBW/kg (Calculated) : 45.5 Heparin Dosing Weight: 67 kg  Vital Signs: Temp: 101.1 F (38.4 C) (12/28 0610) Temp Source: Axillary (12/28 0610) BP: 129/76 (12/28 0610) Pulse Rate: 107 (12/28 0610)  Labs: Recent Labs    12/27/20 1313 12/28/20 1735 12/28/20 1736 12/29/20 0645  HGB 10.3* 9.3*  --  8.8*  HCT 33.2* 30.4*  --  28.3*  PLT 331 290  --  255  APTT  --   --  28  --   LABPROT  --   --  13.8  --   INR  --   --  1.1  --   HEPARINUNFRC  --   --   --  0.44  CREATININE 1.89* 2.05*  --   --   TROPONINIHS 70*  --  53*  --      Estimated Creatinine Clearance: 22.7 mL/min (A) (by C-G formula based on SCr of 2.05 mg/dL (H)).   Medical History: Past Medical History:  Diagnosis Date   Aortic stenosis    Asthma    Coronary artery disease    Diabetes mellitus without complication (HCC)    History of measles    Hypertension    Nonischemic cardiomyopathy ([place])    Pulmonary hypertension (HCC)     Medications:  Per chart review, no anticoagulation prior to admission  Assessment: 75 yo female w/ h/o  nonobstructive CAD, HFimpEF 2/2 niCMP,AS, HTN, DM, and multiple myeloma who presented to ED with SOB. Pharmacy has been consulted for heparin dosing for Afib w RVR. CHADSVASC 7.   Date Time HL Rate/Comment 12/28 0645 0.44 Therapeutic @ 1000 units/hr.     Goal of Therapy:  Heparin level 0.3-0.7 units/ml Monitor platelets by anticoagulation protocol: Yes   Plan:  Heparin level is therapeutic. Will continue heparin infusion at 1000 units/hr. Recheck heparin level in 8 hours. CBC daily while on heparin.   Oswald Hillock, PharmD, BCPS 12/29/2020,8:56 AM

## 2020-12-29 NOTE — Progress Notes (Signed)
PROGRESS NOTE    [name]  QIW:[id number] DOB: 1945/02/07 DOA: 12/27/2020 PCP: Birdie Sons, MD   Brief Narrative: Taken from H&P. [name] is a 75 y.o. female with medical history significant of CKD-IV, hypertension, hyperlipidemia, diet-controlled diabetes, asthma, depression, pulmonary hypertension, CAD, aortic stenosis, dCHF, multiple myeloma, dementia, depression, who presents with shortness of breath. Patient with recent admission earlier this month (12/08/2020-12/15/2020) with recurrent UTIs and associated urinary frequency, anorexia, and agitation superimposed on baseline dementia.  She was discharged to SNF for rehab.  At baseline she is wheelchair dependent but able to transfer herself from bed to wheelchair, rehab purpuras was to go back to her baseline.  Daughter will not be able to help her if she cannot transfer herself.  Per daughter since discharge patient is experiencing worsening fatigue and refusing most of the care and p.o. intake.  For about 5 to 6 days she started refusing all of her medications.  She was tested positive for COVID-19 on 12/27/2020 first at her facility, later confirmed by PCR in ED.  She was also found to have new onset A. fib with RVR on arrival to ED. cardiology was consulted and she was converted to sinus rhythm this morning.  TSH levels pending as patient is refusing labs. CHA2DS2-VASc score of 6, she is currently on heparin infusion and will need NOAC before discharge but she should be able to take it regularly in order to get the benefit.  She was also found to have elevated troponin at 70.  Refusing further labs so no trend yet.  No chest pain.  Admitted for failure to thrive and new onset A. fib with RVR. Palliative care was also consulted.  12/28: Patient continued to refuse food and medicine.  Palliative care talked with the family and they are thinking about considering home hospice versus in patient hospice.  No final decision made  yet. Remained in sinus rhythm-Heparin infusion to be replaced with NOAC if she takes any medicine. Psych also evaluated her and they are not recommending any new changes to her current medication, currently refusing all the meds.  Subjective: Patient was seen and examined today.  She was becoming agitated when I ask her anything.  Had a long discussion with sister at bedside and daughter on phone.  Patient continued to refuse all care. Patient might do better if return home with hospice or home health services.  Assessment & Plan:   Principal Problem:   Atrial fibrillation with RVR (HCC) Active Problems:   Type 2 diabetes mellitus with stage 4 chronic kidney disease (HCC)   Essential hypertension   Multiple myeloma not having achieved remission (HCC)   Chronic HFrEF (heart failure with reduced ejection fraction) (HCC)   CKD (chronic kidney disease), stage IV (HCC)   Suspected COVID-19 virus infection   Elevated troponin   Failure to thrive in adult   CAD (coronary artery disease)   Asthma   Dementia ([place])   Depression  New onset A. fib with RVR.  Heart rate still in low 100s but she converted to sinus rhythm on telemetry.  Appears to be in sinus tachycardia now.  Cardiology is on board. CHA2DS2-VASc score of 6. -Switch heparin with NOAC-currently refusing all the meds -Continue home metoprolol 75 mg twice daily. -TSH within normal limit  COVID-19 infection.  Chest x-ray and procalcitonin negative for any acute findings.  Patient refusing remdesivir.  Molnupiravir can be given if she agrees to take meds. -Continue to monitor  Elevated troponin/history of CAD.  Troponin at 70 and patient is refusing subsequent labs so there is no trend.  No chest pain, most likely demand ischemia with new onset A. fib and RVR. -Trend troponin if she allows. -Repeat echocardiogram -Continue home dose of aspirin  Essential hypertension.  Blood pressure within goal now.  Initially softer blood  pressure and her home meds were being held. -We can resume home lisinopril, amlodipine and Lasix as needed.  Chronic HFrEF with normalization of EF.  Recent echocardiogram done on 04/16/2019 with EF of 60 to 65% and grade 1 diastolic dysfunction.  History of nonischemic cardiomyopathy. Appears euvolemic and rather little dry.  BNP at 128 -Holding home Lasix  CKD stage IV.  Creatinine around baseline.  Baseline between 1.4-2 -Monitor renal function -Avoid nephrotoxins  Multiple myeloma not having achieved remission ([place]) -Follow-up with oncology  Failure to thrive.  Started after prior discharge to rehab.  History of losing her 37 years of spouse 2 months ago.  Depression might be playing a role. -Continuing home Remeron and Seroquel -PT/OT recommending SNF, her refusal to take any food and medication started after going to SNF 2 weeks ago, she might do better if go home with some help. -Palliative care consult -Dietitian consult  Asthma: stable -Bronchodilators   Dementia ([place]) -Donepezil   Depression -Continue home medications: Mirtazapine, Seroquel  Objective: Vitals:   12/29/20 0500 12/29/20 0610 12/29/20 0925 12/29/20 1216  BP:  129/76 (!) 147/66 131/74  Pulse:  (!) 107 (!) 55 88  Resp:  17 18 18   Temp:  (!) 101.1 F (38.4 C) 98 F (36.7 C) 97.9 F (36.6 C)  TempSrc:  Axillary    SpO2:  96% 97% 98%  Weight: 86 kg     Height:        Intake/Output Summary (Last 24 hours) at 12/29/2020 1547 Last data filed at 12/29/2020 0600 Gross per 24 hour  Intake 110.81 ml  Output --  Net 110.81 ml   Filed Weights   12/27/20 1309 12/29/20 0051 12/29/20 0500  Weight: 90.7 kg 86 kg 86 kg    Examination:  General.  Little agitated elderly lady, in no acute distress. Pulmonary.  Lungs clear bilaterally, normal respiratory effort. CV.  Regular rate and rhythm, no JVD, rub or murmur. Abdomen.  Soft, nontender, nondistended, BS positive. CNS.  Alert but not answering any  questions.  No apparent focal neurologic deficit. Extremities.  No edema, no cyanosis, pulses intact and symmetrical. Psychiatry.  Judgment and insight appears impaired  DVT prophylaxis: Xarelto Code Status: DNR Family Communication: Discussed with daughter at bedside. Disposition Plan:  Status is: Inpatient  Remains inpatient appropriate because: Severity of illness   Level of care: Progressive  All the records are reviewed and case discussed with Care Management/Social Worker. Management plans discussed with the patient, nursing and they are in agreement.  Consultants:  Palliative care Cardiology  Procedures:  Antimicrobials:   Data Reviewed: I have personally reviewed following labs and imaging studies  CBC: Recent Labs  Lab 12/27/20 1313 12/28/20 1735 12/29/20 0645  WBC 8.9 9.1 9.2  HGB 10.3* 9.3* 8.8*  HCT 33.2* 30.4* 28.3*  MCV 91.5 90.7 89.3  PLT 331 290 829    Basic Metabolic Panel: Recent Labs  Lab 12/27/20 1313 12/28/20 1735  NA 138 139  K 3.6 3.7  CL 106 106  CO2 26 26  GLUCOSE 168* 140*  BUN 26* 35*  CREATININE 1.89* 2.05*  CALCIUM 9.1 9.1  GFR: Estimated Creatinine Clearance: 22.7 mL/min (A) (by C-G formula based on SCr of 2.05 mg/dL (H)). Liver Function Tests: Recent Labs  Lab 12/27/20 1313  AST 19  ALT 9  ALKPHOS 64  BILITOT 0.3  PROT 7.6  ALBUMIN 2.4*    No results for input(s): LIPASE, AMYLASE in the last 168 hours. No results for input(s): AMMONIA in the last 168 hours. Coagulation Profile: Recent Labs  Lab 12/28/20 1736  INR 1.1   Cardiac Enzymes: No results for input(s): CKTOTAL, CKMB, CKMBINDEX, TROPONINI in the last 168 hours. BNP (last 3 results) No results for input(s): PROBNP in the last 8760 hours. HbA1C: Recent Labs    12/28/20 1736  HGBA1C 6.4*   CBG: Recent Labs  Lab 12/29/20 0922  GLUCAP 100*   Lipid Profile: Recent Labs    12/28/20 1735  CHOL 161  HDL 26*  LDLCALC 99  TRIG 179*   CHOLHDL 6.2   Thyroid Function Tests: Recent Labs    12/28/20 1736  TSH 0.719   Anemia Panel: No results for input(s): VITAMINB12, FOLATE, FERRITIN, TIBC, IRON, RETICCTPCT in the last 72 hours. Sepsis Labs: Recent Labs  Lab 12/27/20 1313 12/28/20 1736  PROCALCITON <0.10  --   LATICACIDVEN  --  1.3     Recent Results (from the past 240 hour(s))  Resp Panel by RT-PCR (Flu A&B, Covid) Nasopharyngeal Swab     Status: Abnormal   Collection Time: 12/27/20  5:29 PM   Specimen: Nasopharyngeal Swab; Nasopharyngeal(NP) swabs in vial transport medium  Result Value Ref Range Status   SARS Coronavirus 2 by RT PCR POSITIVE (A) NEGATIVE Final    Comment: (NOTE) SARS-CoV-2 target nucleic acids are DETECTED.  The SARS-CoV-2 RNA is generally detectable in upper respiratory specimens during the acute phase of infection. Positive results are indicative of the presence of the identified virus, but do not rule out bacterial infection or co-infection with other pathogens not detected by the test. Clinical correlation with patient history and other diagnostic information is necessary to determine patient infection status. The expected result is Negative.  Fact Sheet for Patients: EntrepreneurPulse.com.au  Fact Sheet for Healthcare Providers: IncredibleEmployment.be  This test is not yet approved or cleared by the [place] FDA and  has been authorized for detection and/or diagnosis of SARS-CoV-2 by FDA under an Emergency Use Authorization (EUA).  This EUA will remain in effect (meaning this test can be used) for the duration of  the COVID-19 declaration under Section 564(b)(1) of the A ct, 21 U.S.C. section 360bbb-3(b)(1), unless the authorization is terminated or revoked sooner.     Influenza A by PCR NEGATIVE NEGATIVE Final   Influenza B by PCR NEGATIVE NEGATIVE Final    Comment: (NOTE) The Xpert Xpress SARS-CoV-2/FLU/RSV plus assay is  intended as an aid in the diagnosis of influenza from Nasopharyngeal swab specimens and should not be used as a sole basis for treatment. Nasal washings and aspirates are unacceptable for Xpert Xpress SARS-CoV-2/FLU/RSV testing.  Fact Sheet for Patients: EntrepreneurPulse.com.au  Fact Sheet for Healthcare Providers: IncredibleEmployment.be  This test is not yet approved or cleared by the [place] FDA and has been authorized for detection and/or diagnosis of SARS-CoV-2 by FDA under an Emergency Use Authorization (EUA). This EUA will remain in effect (meaning this test can be used) for the duration of the COVID-19 declaration under Section 564(b)(1) of the Act, 21 U.S.C. section 360bbb-3(b)(1), unless the authorization is terminated or revoked.  Performed at [facility], [address] [place], [place] [address]   Culture, blood (routine x 2)     Status: None (Preliminary result)   Collection Time: 12/28/20  5:35 PM   Specimen: BLOOD  Result Value Ref Range Status   Specimen Description BLOOD RIGHT ANTECUBITAL  Final   Special Requests   Final    BOTTLES DRAWN AEROBIC AND ANAEROBIC Blood Culture adequate volume   Culture  Setup Time   Final    GRAM POSITIVE COCCI AEROBIC BOTTLE ONLY Organism ID to follow Performed at [facility], [address] [place], [place] [address]    Culture [facility] POSITIVE COCCI  Final   Report Status PENDING  Incomplete  Culture, blood (Routine X 2) w Reflex to ID Panel     Status: None (Preliminary result)   Collection Time: 12/29/20 12:42 AM   Specimen: BLOOD  Result Value Ref Range Status   Specimen Description BLOOD LEFT ASSIST CONTROL  Final   Special Requests   Final    IN PEDIATRIC BOTTLE Blood Culture results may not be optimal due to an excessive volume of blood received in culture bottles   Culture   Final    NO GROWTH < 12 HOURS Performed at [facility], [address] [name], [place] [address]    Report Status PENDING  Incomplete      Radiology Studies: No results found.  Scheduled Meds:  aspirin EC  81 mg Oral Daily   chlorhexidine  15 mL Mouth Rinse BID   cholecalciferol  2,000 Units Oral Daily   donepezil  5 mg Oral QHS   mouth rinse  15 mL Mouth Rinse q12n4p   metoprolol tartrate  75 mg Oral BID   mirtazapine  7.5 mg Oral QHS   molnupiravir EUA  4 capsule Oral BID   nystatin  1 application Topical TID   QUEtiapine  25 mg Oral QHS   Rivaroxaban  15 mg Oral Q supper   senna-docusate  1 tablet Oral BID   cyanocobalamin  1,000 mcg Oral Daily   Continuous Infusions:     LOS: 1 day   Time spent: 40 minutes. More than 50% of the time was spent in counseling/coordination of care  Lorella Nimrod, MD [facility]  If 7PM-7AM, please contact night-coverage [email or web address]  12/29/2020, 3:47 PM   This record has been created using Systems analyst. Errors have been sought and corrected,but may not always be located. Such creation errors do not reflect on the standard of care.

## 2020-12-29 NOTE — Progress Notes (Signed)
Patient took off her telemetry and refused to allow staff to put them back on. Patient refused her midnight vitals. MD notified.

## 2020-12-29 NOTE — Progress Notes (Signed)
PT Cancellation Note  Patient Details Name: [name] MRN: [id number] DOB: Apr 01, 1945   Cancelled Treatment:    Reason Eval/Treat Not Completed: Other (comment). Patient's daughter talking with Palliative regarding patient going to hospice care. Decision should be made tomorrow. Will hold for today and await plan.        Greig Altergott 12/29/2020, 1:46 PM

## 2020-12-29 NOTE — Progress Notes (Signed)
Initial Nutrition Assessment  DOCUMENTATION CODES:   Obesity unspecified  INTERVENTION:   -Magic cup TID with meals, each supplement provides 290 kcal and 9 grams of protein  -Liberalize diet to regular  NUTRITION DIAGNOSIS:   Inadequate oral intake related to poor appetite as evidenced by meal completion < 25%.  GOAL:   Patient will meet greater than or equal to 90% of their needs  MONITOR:   PO intake, Supplement acceptance, Labs, Weight trends, Skin, I & O's  REASON FOR ASSESSMENT:   Consult Assessment of nutrition requirement/status  ASSESSMENT:   [name] is a 75 y.o. female with medical history significant of CKD-IV, hypertension, hyperlipidemia, diet-controlled diabetes, asthma, depression, pulmonary hypertension, CAD, aortic stenosis, dCHF, multiple myeloma, dementia, depression, who presents with shortness of breath.  Pt admitted with new onset a-fib with RVR and COVID-19 infection.   Reviewed I/O's: +111 ml x 24 hours  Spoke with pt sister, who is visiting from [place]. She reports that pt consumed a few bites of applesauce,  banana, and muffin with encouragement. Per palliative care notes, pt has been very little and will only eat if family members assist. Sister shares that pt health has been declining for awhile, but this has been exacerbated from pt's husband's passing about 2 months ago. Sister shares that pt is more alert today.   Spoke with pt, who reports "don't touch my blanket because I'm cold". She refused to move her arm to show RD her ID bracelet. Pt refused nutrition-focused physical exam and told this RD "all I want is some peach and quiet". Case discussed with RN, who reports similar experience with pt. Noted she is refusing most care.   Reviewed wt hx; pt has experienced a 5.2% wt loss over the past 4 months, which is not significant for time frame.   Palliative care following for goals of care discussions; pt family considering home with  hospice vs residential hospice.   Medications reviewed and include vitamin D3, remeron, senokot, and vitamin B-12.   Labs reviewed: CBGS: 100 (inpatient orders for glycemic control are none).    Diet Order:   Diet Order             Diet heart healthy/carb modified Room service appropriate? Yes; Fluid consistency: Thin  Diet effective now                   EDUCATION NEEDS:   Not appropriate for education at this time  Skin:  Skin Assessment: Reviewed RN Assessment  Last BM:  12/29/20  Height:   Ht Readings from Last 1 Encounters:  12/29/20 5' (1.524 m)    Weight:   Wt Readings from Last 1 Encounters:  12/29/20 86 kg    Ideal Body Weight:  45.5 kg  BMI:  Body mass index is 37.03 kg/m.  Estimated Nutritional Needs:   Kcal:  1600-1800  Protein:  75-90 grams  Fluid:  > 1.6 L    Loistine Chance, RD, LDN, [place] Registered Dietitian II Certified Diabetes Care and Education Specialist Please refer to [facility] for RD and/or RD on-call/weekend/after hours pager

## 2020-12-29 NOTE — Progress Notes (Signed)
Progress Note  Patient Name: [name] Date of Encounter: 12/29/2020  [facility] Cardiologist: Nelva Bush, MD   Subjective   Somnolent, mittens noted in her hands.  No acute events overnight.  Telemetry showing sinus rhythm with PACs.  Inpatient Medications    Scheduled Meds:  aspirin EC  81 mg Oral Daily   chlorhexidine  15 mL Mouth Rinse BID   cholecalciferol  2,000 Units Oral Daily   donepezil  5 mg Oral QHS   mouth rinse  15 mL Mouth Rinse q12n4p   metoprolol tartrate  75 mg Oral BID   mirtazapine  7.5 mg Oral QHS   molnupiravir EUA  4 capsule Oral BID   nystatin  1 application Topical TID   QUEtiapine  25 mg Oral QHS   Rivaroxaban  15 mg Oral Q supper   senna-docusate  1 tablet Oral BID   cyanocobalamin  1,000 mcg Oral Daily   Continuous Infusions:  PRN Meds: acetaminophen, acetaminophen, albuterol, dextromethorphan-guaiFENesin, haloperidol lactate, hydrALAZINE, ondansetron (ZOFRAN) IV, traZODone   Vital Signs    Vitals:   12/29/20 0500 12/29/20 0610 12/29/20 0925 12/29/20 1216  BP:  129/76 (!) 147/66 131/74  Pulse:  (!) 107 (!) 55 88  Resp:  17 18 18   Temp:  (!) 101.1 F (38.4 C) 98 F (36.7 C) 97.9 F (36.6 C)  TempSrc:  Axillary    SpO2:  96% 97% 98%  Weight: 86 kg     Height:        Intake/Output Summary (Last 24 hours) at 12/29/2020 1222 Last data filed at 12/29/2020 0600 Gross per 24 hour  Intake 110.81 ml  Output --  Net 110.81 ml   Last 3 Weights 12/29/2020 12/29/2020 12/27/2020  Weight (lbs) 189 lb 9.5 oz 189 lb 9.5 oz 200 lb  Weight (kg) 86 kg 86 kg 90.719 kg      Telemetry    Sinus rhythm PACs- Personally Reviewed  ECG     - Personally Reviewed  Physical Exam   GEN: Somnolent, Neck: No JVD Cardiac: RRR, 2/6 systolic murmur Respiratory: Clear to auscultation bilaterally. GI: Soft, nontender, non-distended  MS: No edema; No deformity. Neuro: Unable to assess Psych: Unable to assess  Labs    High  Sensitivity Troponin:   Recent Labs  Lab 12/27/20 1313 12/28/20 1736  TROPONINIHS 70* 53*     Chemistry Recent Labs  Lab 12/27/20 1313 12/28/20 1735  NA 138 139  K 3.6 3.7  CL 106 106  CO2 26 26  GLUCOSE 168* 140*  BUN 26* 35*  CREATININE 1.89* 2.05*  CALCIUM 9.1 9.1  PROT 7.6  --   ALBUMIN 2.4*  --   AST 19  --   ALT 9  --   ALKPHOS 64  --   BILITOT 0.3  --   GFRNONAA 27* 25*  ANIONGAP 6 7    Lipids  Recent Labs  Lab 12/28/20 1735  CHOL 161  TRIG 179*  HDL 26*  LDLCALC 99  CHOLHDL 6.2    Hematology Recent Labs  Lab 12/27/20 1313 12/28/20 1735 12/29/20 0645  WBC 8.9 9.1 9.2  RBC 3.63* 3.35* 3.17*  HGB 10.3* 9.3* 8.8*  HCT 33.2* 30.4* 28.3*  MCV 91.5 90.7 89.3  MCH 28.4 27.8 27.8  MCHC 31.0 30.6 31.1  RDW 14.1 14.3 14.3  PLT 331 290 255   Thyroid  Recent Labs  Lab 12/28/20 1736  TSH 0.719    BNP Recent Labs  Lab  12/27/20 1313  BNP 128.9*    DDimer No results for input(s): DDIMER in the last 168 hours.   Radiology    DG Chest 2 View  Result Date: 12/27/2020 CLINICAL DATA:  COVID-19 positive with decreased oxygen saturation. EXAM: CHEST - 2 VIEW COMPARISON:  12/11/2020 FINDINGS: Lateral view degraded by patient arm position. Midline trachea. Mild cardiomegaly. Atherosclerosis in the transverse aorta. Right paratracheal soft tissue fullness has been grossly similar back to 2017 and is likely due to prominent great vessels. No pleural effusion or pneumothorax. No congestive failure. Low lung volumes with resultant pulmonary interstitial prominence. IMPRESSION: Cardiomegaly and low lung volumes, without acute disease. Electronically Signed   By: Abigail Miyamoto M.D.   On: 12/27/2020 13:58    Cardiac Studies   Echo 04/2019 EF 60 to 65%.  Patient Profile     75 y.o. female with history of hypertension, diabetes, mild aortic stenosis, nonobstructive CAD presenting with fatigue, diagnosed with COVID-19 and A. fib RVR.  Being seen due to A. fib  RVR.  Assessment & Plan    A. fib RVR -Currently in sinus rhythm, continue Lopressor 75 mg twice daily -Echocardiogram with preserved EF, TSH normal. -Currently on heparin, switch to NOAC upon discharge. -Dispo planning to SNF underway as per primary team  2.  Mild aortic stenosis -Continue serial monitoring with echo  3.  CAD, nonobstructive -Medical management continued.  Total encounter time 35 minutes  Greater than 50% was spent in counseling and coordination of care with the patient      Signed, Kate Sable, MD  12/29/2020, 12:22 PM

## 2020-12-29 NOTE — Consult Note (Addendum)
[facility] Face-to-Face Psychiatry Consult   Reason for Consult:  re-evaluation Referring Physician:  S. Amin,.M.D. Patient Identification: [name] MRN:  [id number] Principal Diagnosis: Atrial fibrillation with RVR ([place]) Diagnosis:  Principal Problem:   Atrial fibrillation with RVR ([place]) Active Problems:   Type 2 diabetes mellitus with stage 4 chronic kidney disease (HCC)   Essential hypertension   Multiple myeloma not having achieved remission (HCC)   Chronic HFrEF (heart failure with reduced ejection fraction) (HCC)   CKD (chronic kidney disease), stage IV (HCC)   Suspected COVID-19 virus infection   Elevated troponin   Failure to thrive in adult   CAD (coronary artery disease)   Asthma   Dementia ([place])   Depression   Total Time spent with patient: 20 minutes  Subjective:   [name] is a 75 y.o. female patient admitted with LOW O2 sats and not compliant with meds at rehab facility.  HPI:  Patient was seen by this provider in the ED yesterday. Today, patient was approached on the medical unit. She had her eyes closed, but became alert when her name was called out. Writer introduced herself; patient did not appear or say that she recognized me. When asked about why patient continued to decline taking medications, patient stated that "she didn't care." She stated "I don't take medicines. I am content with the way things are." Writer asked patient if she cared if the medications were going to help her stay alive. Patient would only look and say that she didn't care. She did not say that she wanted to live, as she told me yesterday.  Palliative Care has been consulted and patient's bedside RN told writer that family will make a decision as how to proceed tomorrow. At this point it does not appear that psychiatric medications will be a part of patient's care. In any case, Patient is not appropriate for inpatient admission to psychiatric unit.   Past Psychiatric History: see  previous  Risk to Self:   Risk to Others:   Prior Inpatient Therapy:   Prior Outpatient Therapy:    Past Medical History:  Past Medical History:  Diagnosis Date   Aortic stenosis    Asthma    Coronary artery disease    Diabetes mellitus without complication ([place])    History of measles    Hypertension    Nonischemic cardiomyopathy ([place])    Pulmonary hypertension ([place])     Past Surgical History:  Procedure Laterality Date   CARDIAC CATHETERIZATION     Cyst(solitary) of breast:removed     RIGHT/LEFT HEART CATH AND CORONARY ANGIOGRAPHY N/A 03/07/2016   Procedure: Right/Left Heart Cath and Coronary Angiography;  Surgeon: Nelva Bush, MD;  Location: [place] CV LAB;  Service: Cardiovascular;  Laterality: N/A;   TUBAL LIGATION     Family History:  Family History  Problem Relation Age of Onset   Cancer Mother        Uterine cancer   Diabetes Mother    Heart disease Mother    Heart failure Mother    Parkinson's disease Father    Cancer Sister        breast cancer   Hypertension Sister    Diabetes Sister        Non-insulin dependent Diabetes Mellitus   Diabetes Brother        Non-insulin Dependent Diabetes Mellitus   Cancer Brother    Mesothelioma Brother    Family Psychiatric  History: see previous Social History:  Social History  Substance and Sexual Activity  Alcohol Use No     Social History   Substance and Sexual Activity  Drug Use No    Social History   Socioeconomic History   Marital status: Widowed    Spouse name: Not on file   Number of children: 2   Years of education: Not on file   Highest education level: High school graduate  Occupational History   Occupation: retired  Tobacco Use   Smoking status: Never   Smokeless tobacco: Never  Vaping Use   Vaping Use: Never used  Substance and Sexual Activity   Alcohol use: No   Drug use: No   Sexual activity: Yes    Birth control/protection: None  Other Topics Concern   Not on file   Social History Narrative   Not on file   Social Determinants of Health   Financial Resource Strain: Low Risk    Difficulty of Paying Living Expenses: Not hard at all  Food Insecurity: No Food Insecurity   Worried About Charity fundraiser in the Last Year: Never true   [place] in the Last Year: Never true  Transportation Needs: No Transportation Needs   Lack of Transportation (Medical): No   Lack of Transportation (Non-Medical): No  Physical Activity: Inactive   Days of Exercise per Week: 0 days   Minutes of Exercise per Session: 0 min  Stress: No Stress Concern Present   Feeling of Stress : Only a little  Social Connections: Moderately Isolated   Frequency of Communication with Friends and Family: More than three times a week   Frequency of Social Gatherings with Friends and Family: Never   Attends Religious Services: Never   Marine scientist or Organizations: No   Attends Archivist Meetings: Never   Marital Status: Married   Additional Social History:    Allergies:   Allergies  Allergen Reactions   Hydrochlorothiazide Other (See Comments)    Leg Cramps   Penicillins Rash    Labs:  Results for orders placed or performed during the hospital encounter of 12/27/20 (from the past 48 hour(s))  Culture, blood (routine x 2)     Status: None (Preliminary result)   Collection Time: 12/28/20  5:35 PM   Specimen: BLOOD  Result Value Ref Range   Specimen Description BLOOD RIGHT ANTECUBITAL    Special Requests      BOTTLES DRAWN AEROBIC AND ANAEROBIC Blood Culture adequate volume   Culture  Setup Time      GRAM POSITIVE COCCI IN BOTH AEROBIC AND ANAEROBIC BOTTLES Organism ID to follow CRITICAL RESULT CALLED TO, READ BACK BY AND VERIFIED WITH: BRANDON BEERS 12/29/20 1600 MU Performed at [facility], [address] [place], [place] [address]    Culture GRAM POSITIVE COCCI    Report Status PENDING   Lipid panel     Status: Abnormal    Collection Time: 12/28/20  5:35 PM  Result Value Ref Range   Cholesterol 161 0 - 200 mg/dL   Triglycerides 179 (H) <150 mg/dL   HDL 26 (L) >40 mg/dL   Total CHOL/HDL Ratio 6.2 RATIO   VLDL 36 0 - 40 mg/dL   LDL Cholesterol 99 0 - 99 mg/dL    Comment:        Total Cholesterol/HDL:CHD Risk Coronary Heart Disease Risk Table                     Men  Women  1/2 Average Risk   3.4   3.3  Average Risk       5.0   4.4  2 X Average Risk   9.6   7.1  3 X Average Risk  23.4   11.0        Use the calculated Patient Ratio above and the CHD Risk Table to determine the patient's CHD Risk.        ATP III CLASSIFICATION (LDL):  <100     mg/dL   Optimal  100-129  mg/dL   Near or Above                    Optimal  130-159  mg/dL   Borderline  160-189  mg/dL   High  >190     mg/dL   Very High Performed at [facility], [address] [place], [place] [address]   Basic metabolic panel     Status: Abnormal   Collection Time: 12/28/20  5:35 PM  Result Value Ref Range   Sodium 139 135 - 145 mmol/L   Potassium 3.7 3.5 - 5.1 mmol/L   Chloride 106 98 - 111 mmol/L   CO2 26 22 - 32 mmol/L   Glucose, Bld 140 (H) 70 - 99 mg/dL    Comment: Glucose reference range applies only to samples taken after fasting for at least 8 hours.   BUN 35 (H) 8 - 23 mg/dL   Creatinine, Ser 2.05 (H) 0.44 - 1.00 mg/dL   Calcium 9.1 8.9 - 10.3 mg/dL   GFR, Estimated 25 (L) >60 mL/min    Comment: (NOTE) Calculated using the CKD-EPI Creatinine Equation (2021)    Anion gap 7 5 - 15    Comment: Performed at [facility], [address] [place], [place] [address]  CBC     Status: Abnormal   Collection Time: 12/28/20  5:35 PM  Result Value Ref Range   WBC 9.1 4.0 - 10.5 K/uL   RBC 3.35 (L) 3.87 - 5.11 MIL/uL   Hemoglobin 9.3 (L) 12.0 - 15.0 g/dL   HCT 30.4 (L) 36.0 - 46.0 %   MCV 90.7 80.0 - 100.0 fL   MCH 27.8 26.0 - 34.0 pg   MCHC 30.6 30.0 - 36.0 g/dL   RDW 14.3 11.5 - 15.5 %   Platelets 290  150 - 400 K/uL   nRBC 0.0 0.0 - 0.2 %    Comment: Performed at [facility], [address] [place], [place] [address]  Blood Culture ID Panel (Reflexed)     Status: Abnormal   Collection Time: 12/28/20  5:35 PM  Result Value Ref Range   Enterococcus faecalis NOT DETECTED NOT DETECTED   Enterococcus Faecium NOT DETECTED NOT DETECTED   Listeria monocytogenes NOT DETECTED NOT DETECTED   Staphylococcus species DETECTED (A) NOT DETECTED    Comment: CRITICAL RESULT CALLED TO, READ BACK BY AND VERIFIED WITH: BRANDON BEERS 12/29/20 1600 MU    Staphylococcus aureus (BCID) NOT DETECTED NOT DETECTED   Staphylococcus epidermidis DETECTED (A) NOT DETECTED    Comment: Methicillin (oxacillin) resistant coagulase negative staphylococcus. Possible blood culture contaminant (unless isolated from more than one blood culture draw or clinical case suggests pathogenicity). No antibiotic treatment is indicated for blood  culture contaminants. CRITICAL RESULT CALLED TO, READ BACK BY AND VERIFIED WITH: BRANDON BEERS 12/29/20 1600 MU    Staphylococcus lugdunensis NOT DETECTED NOT DETECTED   Streptococcus species NOT DETECTED NOT DETECTED  Streptococcus agalactiae NOT DETECTED NOT DETECTED   Streptococcus pneumoniae NOT DETECTED NOT DETECTED   Streptococcus pyogenes NOT DETECTED NOT DETECTED   A.calcoaceticus-baumannii NOT DETECTED NOT DETECTED   Bacteroides fragilis NOT DETECTED NOT DETECTED   Enterobacterales NOT DETECTED NOT DETECTED   Enterobacter cloacae complex NOT DETECTED NOT DETECTED   Escherichia coli NOT DETECTED NOT DETECTED   Klebsiella aerogenes NOT DETECTED NOT DETECTED   Klebsiella oxytoca NOT DETECTED NOT DETECTED   Klebsiella pneumoniae NOT DETECTED NOT DETECTED   Proteus species NOT DETECTED NOT DETECTED   Salmonella species NOT DETECTED NOT DETECTED   Serratia marcescens NOT DETECTED NOT DETECTED   Haemophilus influenzae NOT DETECTED NOT DETECTED   Neisseria meningitidis  NOT DETECTED NOT DETECTED   Pseudomonas aeruginosa NOT DETECTED NOT DETECTED   Stenotrophomonas maltophilia NOT DETECTED NOT DETECTED   Candida albicans NOT DETECTED NOT DETECTED   Candida auris NOT DETECTED NOT DETECTED   Candida glabrata NOT DETECTED NOT DETECTED   Candida krusei NOT DETECTED NOT DETECTED   Candida parapsilosis NOT DETECTED NOT DETECTED   Candida tropicalis NOT DETECTED NOT DETECTED   Cryptococcus neoformans/gattii NOT DETECTED NOT DETECTED   Methicillin resistance mecA/C DETECTED (A) NOT DETECTED    Comment: CRITICAL RESULT CALLED TO, READ BACK BY AND VERIFIED WITH: BRANDON BEERS 12/29/20 1600 MU Performed at [facility], [address] [place], [place] [address]   Lactic acid, plasma     Status: None   Collection Time: 12/28/20  5:36 PM  Result Value Ref Range   Lactic Acid, Venous 1.3 0.5 - 1.9 mmol/L    Comment: Performed at [facility], [address] [place], [place] [address]  Hemoglobin A1c     Status: Abnormal   Collection Time: 12/28/20  5:36 PM  Result Value Ref Range   Hgb A1c MFr Bld 6.4 (H) 4.8 - 5.6 %    Comment: (NOTE) Pre diabetes:          5.7%-6.4%  Diabetes:              >6.4%  Glycemic control for   <7.0% adults with diabetes    Mean Plasma Glucose 136.98 mg/dL    Comment: Performed at [facility] [address], [place], [place] [address]  APTT     Status: None   Collection Time: 12/28/20  5:36 PM  Result Value Ref Range   aPTT 28 24 - 36 seconds    Comment: Performed at [facility], [address] [place], [place] [address]  Protime-INR     Status: None   Collection Time: 12/28/20  5:36 PM  Result Value Ref Range   Prothrombin Time 13.8 11.4 - 15.2 seconds   INR 1.1 0.8 - 1.2    Comment: (NOTE) INR goal varies based on device and disease states. Performed at [facility], [address] [place], [place] [address]   C-reactive protein     Status: Abnormal   Collection  Time: 12/28/20  5:36 PM  Result Value Ref Range   CRP 5.6 (H) <1.0 mg/dL    Comment: Performed at [facility] [address] [place], [place] [address]  Troponin I (High Sensitivity)     Status: Abnormal   Collection Time: 12/28/20  5:36 PM  Result Value Ref Range   Troponin I (High Sensitivity) 53 (H) <18 ng/L    Comment: (NOTE) Elevated high sensitivity troponin I (hsTnI) values and significant  changes across serial measurements may  suggest ACS but many other  chronic and acute conditions are known to elevate hsTnI results.  Refer to the "Links" section for chest pain algorithms and additional  guidance. Performed at [facility], [address] [place], [place] [address]   TSH     Status: None   Collection Time: 12/28/20  5:36 PM  Result Value Ref Range   TSH 0.719 0.350 - 4.500 uIU/mL    Comment: Performed by a 3rd Generation assay with a functional sensitivity of <=0.01 uIU/mL. Performed at [facility], [place], [address] [place], [place] [address]   Culture, blood (Routine X 2) w Reflex to ID Panel     Status: None (Preliminary result)   Collection Time: 12/29/20 12:42 AM   Specimen: BLOOD  Result Value Ref Range   Specimen Description BLOOD LEFT ASSIST CONTROL    Special Requests      IN PEDIATRIC BOTTLE Blood Culture results may not be optimal due to an excessive volume of blood received in culture bottles   Culture      NO GROWTH < 12 HOURS Performed at [facility], [address] [place], [place] [address]    Report Status PENDING   Heparin level (unfractionated)     Status: None   Collection Time: 12/29/20  6:45 AM  Result Value Ref Range   Heparin Unfractionated 0.44 0.30 - 0.70 IU/mL    Comment: (NOTE) The clinical reportable range upper limit is being lowered to >1.10 to align with the FDA approved guidance for the current laboratory assay.  If heparin results are below expected values, and patient dosage has  been  confirmed, suggest follow up testing of antithrombin III levels. Performed at [facility], [address] [place], [place] [address]   CBC     Status: Abnormal   Collection Time: 12/29/20  6:45 AM  Result Value Ref Range   WBC 9.2 4.0 - 10.5 K/uL   RBC 3.17 (L) 3.87 - 5.11 MIL/uL   Hemoglobin 8.8 (L) 12.0 - 15.0 g/dL   HCT 28.3 (L) 36.0 - 46.0 %   MCV 89.3 80.0 - 100.0 fL   MCH 27.8 26.0 - 34.0 pg   MCHC 31.1 30.0 - 36.0 g/dL   RDW 14.3 11.5 - 15.5 %   Platelets 255 150 - 400 K/uL   nRBC 0.0 0.0 - 0.2 %    Comment: Performed at [facility], [address] [place], [place] [address]  Glucose, capillary     Status: Abnormal   Collection Time: 12/29/20  9:22 AM  Result Value Ref Range   Glucose-Capillary 100 (H) 70 - 99 mg/dL    Comment: Glucose reference range applies only to samples taken after fasting for at least 8 hours.    Current Facility-Administered Medications  Medication Dose Route Frequency Provider Last Rate Last Admin   acetaminophen (TYLENOL) suppository 650 mg  650 mg Rectal Q6H PRN Mansy, Jan A, MD   650 mg at 12/29/20 9794   acetaminophen (TYLENOL) tablet 650 mg  650 mg Oral Q6H PRN Ivor Costa, MD       albuterol (VENTOLIN HFA) 108 (90 Base) MCG/ACT inhaler 2 puff  2 puff Inhalation Q4H PRN Ivor Costa, MD       aspirin EC tablet 81 mg  81 mg Oral Daily Ivor Costa, MD       chlorhexidine (PERIDEX) 0.12 % solution 15 mL  15 mL Mouth Rinse BID Lorella Nimrod, MD  cholecalciferol (VITAMIN D3) tablet 2,000 Units  2,000 Units Oral Daily Ivor Costa, MD       dextromethorphan-guaiFENesin [facility] DM) 30-600 MG per 12 hr tablet 1 tablet  1 tablet Oral BID PRN Ivor Costa, MD       donepezil (ARICEPT) tablet 5 mg  5 mg Oral QHS Ivor Costa, MD       haloperidol lactate (HALDOL) injection 2 mg  2 mg Intramuscular Q6H PRN Mansy, Jan A, MD       hydrALAZINE (APRESOLINE) injection 5 mg  5 mg Intravenous Q2H PRN Ivor Costa, MD       MEDLINE mouth rinse   15 mL Mouth Rinse q12n4p Lorella Nimrod, MD       metoprolol tartrate (LOPRESSOR) tablet 75 mg  75 mg Oral BID Ivor Costa, MD   75 mg at 12/29/20 1036   mirtazapine (REMERON) tablet 7.5 mg  7.5 mg Oral QHS Rashiya Lofland, Barbaraann Share F, NP       molnupiravir EUA (LAGEVRIO) capsule 800 mg  4 capsule Oral BID Lorella Nimrod, MD       nystatin (MYCOSTATIN/NYSTOP) topical powder 1 application  1 application Topical TID Ivor Costa, MD       ondansetron [facility]) injection 4 mg  4 mg Intravenous Q8H PRN Ivor Costa, MD       QUEtiapine (SEROQUEL) tablet 25 mg  25 mg Oral QHS Ivor Costa, MD       Rivaroxaban (XARELTO) tablet 15 mg  15 mg Oral Q supper Lorella Nimrod, MD   15 mg at 12/29/20 1310   senna-docusate (Senokot-S) tablet 1 tablet  1 tablet Oral BID Ivor Costa, MD       traZODone (DESYREL) tablet 100 mg  100 mg Oral QHS PRN Ivor Costa, MD       vitamin B-12 (CYANOCOBALAMIN) tablet 1,000 mcg  1,000 mcg Oral Daily Ivor Costa, MD        Musculoskeletal: Strength & Muscle Tone: decreased Gait & Station:  did not observe. Patient leans: N/A            Psychiatric Specialty Exam:  Presentation  General Appearance: Appropriate for Environment  Eye Contact:Fair  Speech:No data recorded Speech Volume:Normal  Handedness:No data recorded  Mood and Affect  Mood:Depressed  Affect:No data recorded  Thought Process  Thought Processes:Coherent  Descriptions of Associations:No data recorded Orientation:Partial  Thought Content:No data recorded History of Schizophrenia/Schizoaffective disorder:No data recorded Duration of Psychotic Symptoms:No data recorded Hallucinations:No data recorded Ideas of Reference:No data recorded Suicidal Thoughts:Suicidal Thoughts: No  Homicidal Thoughts:Homicidal Thoughts: No   Sensorium  Memory:Immediate Fair  Judgment:Poor  Insight:Poor   Executive Functions  Concentration:Fair  Attention Span:Fair  [place]   Psychomotor Activity  Psychomotor Activity:Psychomotor Activity: Normal   Assets  Assets:No data recorded  Sleep  Sleep:Sleep: Fair   Physical Exam: Physical Exam ROS Blood pressure 131/74, pulse 88, temperature 97.9 F (36.6 C), resp. rate 18, height 5' (1.524 m), weight 86 kg, SpO2 98 %. Body mass index is 37.03 kg/m.  Treatment Plan Summary: 75 year old female with diagnoses as outlined above. Patient does not desire any psychiatric interventions, refusing all medications at this point. Palliative care has been consulted by hospitalist, with decision to be made tomorrow.    Disposition: Patient does not meet criteria for psychiatric inpatient admission. Please do another psych consult if needed  Sherlon Handing, NP 12/29/2020 7:21 PM

## 2020-12-29 NOTE — NC FL2 (Signed)
[place] LEVEL OF CARE SCREENING TOOL     IDENTIFICATION  Patient Name: [name] Birthdate: 1945/06/05 Sex: female Admission Date (Current Location): 12/27/2020  [facility] and [place] Number:  Engineering geologist and Address:  [facility], [address], [place], [place] [address]      Provider Number: [id number]  Attending Physician Name and Address:  Lorella Nimrod, MD  Relative Name and Phone Number:  [place] (daughter) [phone]    Current Level of Care: Hospital Recommended Level of Care: [place] Prior Approval Number:    Date Approved/Denied:   PASRR Number: [id number]  Discharge Plan: SNF    Current Diagnoses: Patient Active Problem List   Diagnosis Date Noted   Atrial fibrillation with RVR ([place]) 12/27/2020   Suspected COVID-19 virus infection 12/27/2020   Elevated troponin 12/27/2020   Failure to thrive in adult 12/27/2020   CAD (coronary artery disease) 12/27/2020   Asthma 12/27/2020   Dementia ([place]) 12/27/2020   Depression 12/27/2020   Ambulatory dysfunction    Pyelonephritis    Acute UTI 12/12/2020   History of multiple myeloma 10/06/2019   CKD (chronic kidney disease), stage IV ([place]) 04/10/2019   Chronic HFrEF (heart failure with reduced ejection fraction) ([place]) 09/27/2017   Urinary frequency 09/27/2017   Chronic kidney disease, stage 3b ([place]) 03/15/2017   B12 deficiency 03/01/2017   Lymphedema 02/14/2017   Chronic venous insufficiency 02/14/2017   Nonischemic cardiomyopathy ([place]) 06/14/2016   CAD in native artery 06/14/2016   Bilateral lower extremity edema 06/14/2016   Multiple myeloma not having achieved remission ([place]) 03/26/2016   Back pain 03/26/2016   Diverticulosis 03/20/2016   Atherosclerosis of aorta (HCC) 03/20/2016   Congestive heart failure (HCC) 03/07/2016   Monoclonal gammopathy 01/25/2016   Chronic anemia 01/25/2016   LVH (left ventricular hypertrophy) 08/24/2015    Mild aortic stenosis 07/14/2015   Urinary incontinence 03/02/2015   Diabetic retinopathy with macular edema (HCC) 02/19/2015   Constipation 12/02/2014   Osteoarthritis 07/31/2014   Insomnia 06/30/2014   Bleeding internal hemorrhoids 06/30/2014   Osteopenia 06/30/2014   Fecal occult blood test positive 06/30/2014   Vitamin D deficiency 04/29/2009   Type 2 diabetes mellitus with stage 4 chronic kidney disease ([place]) 01/28/2009   Intertrigo 01/03/2008   Hyperlipidemia 04/02/2006   Chronic edema 08/11/2003   BMI 40.0-44.9, adult ([place]) 09/22/2002   Intermittent asthma, uncontrolled 08/31/2000   Essential hypertension 08/31/2000   Leg varices 04/18/1999    Orientation RESPIRATION BLADDER Height & Weight     Self  Normal Incontinent, External catheter Weight: 189 lb 9.5 oz (86 kg) Height:  5' (152.4 cm)  BEHAVIORAL SYMPTOMS/MOOD NEUROLOGICAL BOWEL NUTRITION STATUS      Incontinent Diet (see discharge summary)  AMBULATORY STATUS COMMUNICATION OF NEEDS Skin   Extensive Assist Verbally Normal                       Personal Care Assistance Level of Assistance  Bathing, Feeding, Dressing, Total care Bathing Assistance: Maximum assistance Feeding assistance: Maximum assistance Dressing Assistance: Maximum assistance Total Care Assistance: Maximum assistance   Functional Limitations Info  Sight, Hearing, Speech Sight Info: Adequate Hearing Info: Adequate Speech Info: Adequate    SPECIAL CARE FACTORS FREQUENCY  PT (By licensed PT), OT (By licensed OT)     PT Frequency: min 4x weekly OT Frequency: min 4x weekly            Contractures Contractures Info: Not present  Additional Factors Info  Code Status, Allergies Code Status Info: DNR Allergies Info: hydrochlorothiazide, penicillins           Current Medications (12/29/2020):  This is the current hospital active medication list Current Facility-Administered Medications  Medication Dose Route Frequency  Provider Last Rate Last Admin   acetaminophen (TYLENOL) suppository 650 mg  650 mg Rectal Q6H PRN Mansy, Jan A, MD   650 mg at 12/29/20 1550   acetaminophen (TYLENOL) tablet 650 mg  650 mg Oral Q6H PRN Ivor Costa, MD       albuterol (VENTOLIN HFA) 108 (90 Base) MCG/ACT inhaler 2 puff  2 puff Inhalation Q4H PRN Ivor Costa, MD       aspirin EC tablet 81 mg  81 mg Oral Daily Ivor Costa, MD       chlorhexidine (PERIDEX) 0.12 % solution 15 mL  15 mL Mouth Rinse BID Lorella Nimrod, MD       cholecalciferol (VITAMIN D3) tablet 2,000 Units  2,000 Units Oral Daily Ivor Costa, MD       dextromethorphan-guaiFENesin ([place] DM) 30-600 MG per 12 hr tablet 1 tablet  1 tablet Oral BID PRN Ivor Costa, MD       donepezil (ARICEPT) tablet 5 mg  5 mg Oral QHS Ivor Costa, MD       haloperidol lactate (HALDOL) injection 2 mg  2 mg Intramuscular Q6H PRN Mansy, Jan A, MD       hydrALAZINE (APRESOLINE) injection 5 mg  5 mg Intravenous Q2H PRN Ivor Costa, MD       MEDLINE mouth rinse  15 mL Mouth Rinse q12n4p Lorella Nimrod, MD       metoprolol tartrate (LOPRESSOR) tablet 75 mg  75 mg Oral BID Ivor Costa, MD   75 mg at 12/29/20 1036   mirtazapine (REMERON) tablet 7.5 mg  7.5 mg Oral QHS Barthold, Barbaraann Share F, NP       molnupiravir EUA (LAGEVRIO) capsule 800 mg  4 capsule Oral BID Lorella Nimrod, MD       nystatin (MYCOSTATIN/NYSTOP) topical powder 1 application  1 application Topical TID Ivor Costa, MD       ondansetron [facility]) injection 4 mg  4 mg Intravenous Q8H PRN Ivor Costa, MD       QUEtiapine (SEROQUEL) tablet 25 mg  25 mg Oral QHS Ivor Costa, MD       Rivaroxaban (XARELTO) tablet 15 mg  15 mg Oral Q supper Lorella Nimrod, MD   15 mg at 12/29/20 1310   senna-docusate (Senokot-S) tablet 1 tablet  1 tablet Oral BID Ivor Costa, MD       traZODone (DESYREL) tablet 100 mg  100 mg Oral QHS PRN Ivor Costa, MD       vitamin B-12 (CYANOCOBALAMIN) tablet 1,000 mcg  1,000 mcg Oral Daily Ivor Costa, MD         Discharge  Medications: Please see discharge summary for a list of discharge medications.  Relevant Imaging Results:  Relevant Lab Results:   Additional Information SSN: [id number]  Alberteen Sam, LCSW

## 2020-12-30 DIAGNOSIS — I5022 Chronic systolic (congestive) heart failure: Secondary | ICD-10-CM | POA: Diagnosis not present

## 2020-12-30 DIAGNOSIS — Z7189 Other specified counseling: Secondary | ICD-10-CM

## 2020-12-30 DIAGNOSIS — I4891 Unspecified atrial fibrillation: Secondary | ICD-10-CM | POA: Diagnosis not present

## 2020-12-30 LAB — GLUCOSE, CAPILLARY: Glucose-Capillary: 106 mg/dL — ABNORMAL HIGH (ref 70–99)

## 2020-12-30 MED ORDER — HALOPERIDOL LACTATE 2 MG/ML PO CONC
0.5000 mg | ORAL | Status: DC | PRN
  Filled 2020-12-30: qty 0.3

## 2020-12-30 MED ORDER — POLYVINYL ALCOHOL 1.4 % OP SOLN
1.0000 [drp] | Freq: Four times a day (QID) | OPHTHALMIC | Status: DC | PRN
  Filled 2020-12-30: qty 15

## 2020-12-30 MED ORDER — GLYCOPYRROLATE 0.2 MG/ML IJ SOLN
0.2000 mg | INTRAMUSCULAR | Status: DC | PRN
  Filled 2020-12-30: qty 1

## 2020-12-30 MED ORDER — HALOPERIDOL LACTATE 5 MG/ML IJ SOLN: 0.5000 mg | INTRAMUSCULAR | Status: DC | PRN

## 2020-12-30 MED ORDER — GLYCOPYRROLATE 1 MG PO TABS
1.0000 mg | ORAL_TABLET | ORAL | Status: DC | PRN
  Filled 2020-12-30: qty 1

## 2020-12-30 NOTE — Progress Notes (Signed)
PROGRESS NOTE    [name]  DZH:[id number] DOB: 16-Jul-1945 DOA: 12/27/2020 PCP: Birdie Sons, MD   Brief Narrative: Taken from H&P. [name] is a 75 y.o. female with medical history significant of CKD-IV, hypertension, hyperlipidemia, diet-controlled diabetes, asthma, depression, pulmonary hypertension, CAD, aortic stenosis, dCHF, multiple myeloma, dementia, depression, who presents with shortness of breath. Patient with recent admission earlier this month (12/08/2020-12/15/2020) with recurrent UTIs and associated urinary frequency, anorexia, and agitation superimposed on baseline dementia.  She was discharged to SNF for rehab.  At baseline she is wheelchair dependent but able to transfer herself from bed to wheelchair, rehab purpuras was to go back to her baseline.  Daughter will not be able to help her if she cannot transfer herself.  Per daughter since discharge patient is experiencing worsening fatigue and refusing most of the care and p.o. intake.  For about 5 to 6 days she started refusing all of her medications.  She was tested positive for COVID-19 on 12/27/2020 first at her facility, later confirmed by PCR in ED.  She was also found to have new onset A. fib with RVR on arrival to ED. cardiology was consulted and she was converted to sinus rhythm this morning.  TSH levels pending as patient is refusing labs. CHA2DS2-VASc score of 6, she is currently on heparin infusion and will need NOAC before discharge but she should be able to take it regularly in order to get the benefit.  She was also found to have elevated troponin at 70.  Refusing further labs so no trend yet.  No chest pain.  Admitted for failure to thrive and new onset A. fib with RVR. Palliative care was also consulted.  12/28: Patient continued to refuse food and medicine.  Palliative care talked with the family and they are thinking about considering home hospice versus in patient hospice.  No final decision made  yet. Remained in sinus rhythm-Heparin infusion to be replaced with NOAC if she takes any medicine. Psych also evaluated her and they are not recommending any new changes to her current medication, currently refusing all the meds.  12/29: Family decided to move with comfort measures only.  Patient continued to refuse all p.o. intake including all the meds and food.  Family wants her to go to hospice home and for that she has to complete 10 days of quarantine, tested positive on 12/27/2020 and remained asymptomatic since then.  Subjective: Patient was seen and examined today she continued to refuse all the p.o. intake.  When I tried to ask her if she will take any food, she said do not talk with me and it is not your business why I do not want to take anything.  I just do not want to take anything at this time.  Assessment & Plan:   Principal Problem:   Atrial fibrillation with RVR (HCC) Active Problems:   Type 2 diabetes mellitus with stage 4 chronic kidney disease (HCC)   Essential hypertension   Multiple myeloma not having achieved remission (HCC)   Chronic HFrEF (heart failure with reduced ejection fraction) (HCC)   CKD (chronic kidney disease), stage IV (HCC)   Suspected COVID-19 virus infection   Elevated troponin   Failure to thrive in adult   CAD (coronary artery disease)   Asthma   Dementia ([place])   Depression  New onset A. fib with RVR.  She converted to sinus rhythm on telemetry initially and then refusing to wear any telemetry.  Cardiology  signed off today as she is refusing all medications and plan is to move to comfort measures only. CHA2DS2-VASc score of 6. -It is reasonable to hold anticoagulation as patient is refusing all meds and family decided to move with comfort measures only-awaiting evaluation by inpatient hospice services, patient needs to complete 10 days of quarantine before she can go over there. -Continue home metoprolol 75 mg twice daily, if she takes  it. -TSH within normal limit  COVID-19 infection.  Chest x-ray and procalcitonin negative for any acute findings.  Patient refusing remdesivir.  Molnupiravir can be given if she agrees to take meds. -Continue to monitor  Elevated troponin/history of CAD.  Troponin at 70 and patient is refusing subsequent labs so there is no trend.  No chest pain, most likely demand ischemia with new onset A. fib and RVR.  Patient refused repeat echocardiogram also. -Now being transitioned to comfort care only.  Essential hypertension.  Blood pressure within goal now.  Initially softer blood pressure and her home meds were being held. -We can resume home lisinopril, amlodipine and Lasix as needed.  Chronic HFrEF with normalization of EF.  Recent echocardiogram done on 04/16/2019 with EF of 60 to 65% and grade 1 diastolic dysfunction.  History of nonischemic cardiomyopathy. Appears euvolemic and rather little dry.  BNP at 128 -Patient refusing all meds  CKD stage IV.  Creatinine around baseline on admission.  Baseline between 1.4-2 -Avoid nephrotoxins  Multiple myeloma not having achieved remission ([place]) -Follow-up with oncology  Failure to thrive.  Started after prior discharge to rehab.  History of losing her 72 years of spouse 2 months ago.  Depression might be playing a role.  We obtained psych consult and they do not think that she need any changes to her current medication.  Patient is refusing to take all the meds. Family decided to transition her to comfort care only. -Continuing home Remeron and Seroquel-if she takes it -Dietitian consult  Asthma: stable -Bronchodilators   Dementia ([place]) -Donepezil   Depression -Continue home medications: Mirtazapine, Seroquel  Objective: Vitals:   12/29/20 0925 12/29/20 1216 12/30/20 0507 12/30/20 0747  BP: (!) 147/66 131/74  131/65  Pulse: (!) 55 88  99  Resp: 18 18    Temp: 98 F (36.7 C) 97.9 F (36.6 C)    TempSrc:      SpO2: 97% 98%  97%   Weight:   86 kg   Height:       No intake or output data in the 24 hours ending 12/30/20 1502  Filed Weights   12/29/20 0051 12/29/20 0500 12/30/20 0507  Weight: 86 kg 86 kg 86 kg    Examination:  General.  Agitated elderly lady, in no acute distress. Pulmonary.  Lungs clear bilaterally, normal respiratory effort. CV.  Regular rate and rhythm, no JVD, rub or murmur. Abdomen.  Soft, nontender, nondistended, BS positive. CNS.  Alert and oriented to self only.  No focal neurologic deficit. Extremities.  No edema, no cyanosis, pulses intact and symmetrical. Psychiatry.  Judgment and insight appears impaired  DVT prophylaxis: Patient refusing all care, now comfort care Code Status: DNR Family Communication: . Disposition Plan:  Status is: Inpatient  Remains inpatient appropriate because: Severity of illness   Level of care: Progressive  All the records are reviewed and case discussed with Care Management/Social Worker. Management plans discussed with the patient, nursing and they are in agreement.  Consultants:  Palliative care Cardiology  Procedures:  Antimicrobials:   Data  Reviewed: I have personally reviewed following labs and imaging studies  CBC: Recent Labs  Lab 12/27/20 1313 12/28/20 1735 12/29/20 0645  WBC 8.9 9.1 9.2  HGB 10.3* 9.3* 8.8*  HCT 33.2* 30.4* 28.3*  MCV 91.5 90.7 89.3  PLT 331 290 941    Basic Metabolic Panel: Recent Labs  Lab 12/27/20 1313 12/28/20 1735  NA 138 139  K 3.6 3.7  CL 106 106  CO2 26 26  GLUCOSE 168* 140*  BUN 26* 35*  CREATININE 1.89* 2.05*  CALCIUM 9.1 9.1    GFR: Estimated Creatinine Clearance: 22.7 mL/min (A) (by C-G formula based on SCr of 2.05 mg/dL (H)). Liver Function Tests: Recent Labs  Lab 12/27/20 1313  AST 19  ALT 9  ALKPHOS 64  BILITOT 0.3  PROT 7.6  ALBUMIN 2.4*    No results for input(s): LIPASE, AMYLASE in the last 168 hours. No results for input(s): AMMONIA in the last 168  hours. Coagulation Profile: Recent Labs  Lab 12/28/20 1736  INR 1.1    Cardiac Enzymes: No results for input(s): CKTOTAL, CKMB, CKMBINDEX, TROPONINI in the last 168 hours. BNP (last 3 results) No results for input(s): PROBNP in the last 8760 hours. HbA1C: Recent Labs    12/28/20 1736  HGBA1C 6.4*    CBG: Recent Labs  Lab 12/29/20 0922 12/30/20 0752  GLUCAP 100* 106*    Lipid Profile: Recent Labs    12/28/20 1735  CHOL 161  HDL 26*  LDLCALC 99  TRIG 179*  CHOLHDL 6.2    Thyroid Function Tests: Recent Labs    12/28/20 1736  TSH 0.719    Anemia Panel: No results for input(s): VITAMINB12, FOLATE, FERRITIN, TIBC, IRON, RETICCTPCT in the last 72 hours. Sepsis Labs: Recent Labs  Lab 12/27/20 1313 12/28/20 1736  PROCALCITON <0.10  --   LATICACIDVEN  --  1.3     Recent Results (from the past 240 hour(s))  Resp Panel by RT-PCR (Flu A&B, Covid) Nasopharyngeal Swab     Status: Abnormal   Collection Time: 12/27/20  5:29 PM   Specimen: Nasopharyngeal Swab; Nasopharyngeal(NP) swabs in vial transport medium  Result Value Ref Range Status   SARS Coronavirus 2 by RT PCR POSITIVE (A) NEGATIVE Final    Comment: (NOTE) SARS-CoV-2 target nucleic acids are DETECTED.  The SARS-CoV-2 RNA is generally detectable in upper respiratory specimens during the acute phase of infection. Positive results are indicative of the presence of the identified virus, but do not rule out bacterial infection or co-infection with other pathogens not detected by the test. Clinical correlation with patient history and other diagnostic information is necessary to determine patient infection status. The expected result is Negative.  Fact Sheet for Patients: EntrepreneurPulse.com.au  Fact Sheet for Healthcare Providers: IncredibleEmployment.be  This test is not yet approved or cleared by the [place] FDA and  has been authorized for detection  and/or diagnosis of SARS-CoV-2 by FDA under an Emergency Use Authorization (EUA).  This EUA will remain in effect (meaning this test can be used) for the duration of  the COVID-19 declaration under Section 564(b)(1) of the A ct, 21 U.S.C. section 360bbb-3(b)(1), unless the authorization is terminated or revoked sooner.     Influenza A by PCR NEGATIVE NEGATIVE Final   Influenza B by PCR NEGATIVE NEGATIVE Final    Comment: (NOTE) The Xpert Xpress SARS-CoV-2/FLU/RSV plus assay is intended as an aid in the diagnosis of influenza from Nasopharyngeal swab specimens and should not be used as a  sole basis for treatment. Nasal washings and aspirates are unacceptable for Xpert Xpress SARS-CoV-2/FLU/RSV testing.  Fact Sheet for Patients: EntrepreneurPulse.com.au  Fact Sheet for Healthcare Providers: IncredibleEmployment.be  This test is not yet approved or cleared by the [place] FDA and has been authorized for detection and/or diagnosis of SARS-CoV-2 by FDA under an Emergency Use Authorization (EUA). This EUA will remain in effect (meaning this test can be used) for the duration of the COVID-19 declaration under Section 564(b)(1) of the Act, 21 U.S.C. section 360bbb-3(b)(1), unless the authorization is terminated or revoked.  Performed at [facility], [address], [place], [place] [address]   Urine Culture     Status: Abnormal   Collection Time: 12/28/20  6:37 AM   Specimen: Urine, Clean Catch  Result Value Ref Range Status   Specimen Description   Final    URINE, CLEAN CATCH Performed at [facility], [address] [place], [place] [address]    Special Requests   Final    NONE Performed at [facility], [address] [place], [place] [address]    Culture MULTIPLE SPECIES PRESENT, SUGGEST RECOLLECTION (A)  Final   Report Status 12/29/2020 FINAL  Final  Culture, blood (routine x 2)     Status: Abnormal  (Preliminary result)   Collection Time: 12/28/20  5:35 PM   Specimen: BLOOD  Result Value Ref Range Status   Specimen Description   Final    BLOOD RIGHT ANTECUBITAL Performed at [facility], [address] [place], [place] [address]    Special Requests   Final    BOTTLES DRAWN AEROBIC AND ANAEROBIC Blood Culture adequate volume Performed at [facility], [address] [place], [place] [address]    Culture  Setup Time   Final    GRAM POSITIVE COCCI IN BOTH AEROBIC AND ANAEROBIC BOTTLES Organism ID to follow CRITICAL RESULT CALLED TO, READ BACK BY AND VERIFIED WITH: BRANDON BEERS 12/29/20 1600 MU Performed at [facility], [address] [place], [place] [address]    Culture STAPHYLOCOCCUS EPIDERMIDIS (A)  Final   Report Status PENDING  Incomplete  Blood Culture ID Panel (Reflexed)     Status: Abnormal   Collection Time: 12/28/20  5:35 PM  Result Value Ref Range Status   Enterococcus faecalis NOT DETECTED NOT DETECTED Final   Enterococcus Faecium NOT DETECTED NOT DETECTED Final   Listeria monocytogenes NOT DETECTED NOT DETECTED Final   Staphylococcus species DETECTED (A) NOT DETECTED Final    Comment: CRITICAL RESULT CALLED TO, READ BACK BY AND VERIFIED WITH: BRANDON BEERS 12/29/20 1600 MU    Staphylococcus aureus (BCID) NOT DETECTED NOT DETECTED Final   Staphylococcus epidermidis DETECTED (A) NOT DETECTED Final    Comment: Methicillin (oxacillin) resistant coagulase negative staphylococcus. Possible blood culture contaminant (unless isolated from more than one blood culture draw or clinical case suggests pathogenicity). No antibiotic treatment is indicated for blood  culture contaminants. CRITICAL RESULT CALLED TO, READ BACK BY AND VERIFIED WITH: BRANDON BEERS 12/29/20 1600 MU    Staphylococcus lugdunensis NOT DETECTED NOT DETECTED Final   Streptococcus species NOT DETECTED NOT DETECTED Final   Streptococcus agalactiae NOT DETECTED NOT  DETECTED Final   Streptococcus pneumoniae NOT DETECTED NOT DETECTED Final   Streptococcus pyogenes NOT DETECTED NOT DETECTED Final   A.calcoaceticus-baumannii NOT DETECTED NOT DETECTED Final   Bacteroides fragilis NOT DETECTED NOT DETECTED Final   Enterobacterales NOT DETECTED NOT DETECTED Final   Enterobacter cloacae complex NOT DETECTED  NOT DETECTED Final   Escherichia coli NOT DETECTED NOT DETECTED Final   Klebsiella aerogenes NOT DETECTED NOT DETECTED Final   Klebsiella oxytoca NOT DETECTED NOT DETECTED Final   Klebsiella pneumoniae NOT DETECTED NOT DETECTED Final   Proteus species NOT DETECTED NOT DETECTED Final   Salmonella species NOT DETECTED NOT DETECTED Final   Serratia marcescens NOT DETECTED NOT DETECTED Final   Haemophilus influenzae NOT DETECTED NOT DETECTED Final   Neisseria meningitidis NOT DETECTED NOT DETECTED Final   Pseudomonas aeruginosa NOT DETECTED NOT DETECTED Final   Stenotrophomonas maltophilia NOT DETECTED NOT DETECTED Final   Candida albicans NOT DETECTED NOT DETECTED Final   Candida auris NOT DETECTED NOT DETECTED Final   Candida glabrata NOT DETECTED NOT DETECTED Final   Candida krusei NOT DETECTED NOT DETECTED Final   Candida parapsilosis NOT DETECTED NOT DETECTED Final   Candida tropicalis NOT DETECTED NOT DETECTED Final   Cryptococcus neoformans/gattii NOT DETECTED NOT DETECTED Final   Methicillin resistance mecA/C DETECTED (A) NOT DETECTED Final    Comment: CRITICAL RESULT CALLED TO, READ BACK BY AND VERIFIED WITH: BRANDON BEERS 12/29/20 1600 MU Performed at [facility], [address] [place], [place] [address]   Culture, blood (Routine X 2) w Reflex to ID Panel     Status: None (Preliminary result)   Collection Time: 12/29/20 12:42 AM   Specimen: BLOOD  Result Value Ref Range Status   Specimen Description BLOOD LEFT ASSIST CONTROL  Final   Special Requests   Final    IN PEDIATRIC BOTTLE Blood Culture results may not be optimal due to  an excessive volume of blood received in culture bottles   Culture   Final    NO GROWTH 1 DAY Performed at [facility], [address] [place], [place] [address]    Report Status PENDING  Incomplete      Radiology Studies: No results found.  Scheduled Meds:   Continuous Infusions:     LOS: 2 days   Time spent: 43 minutes. More than 50% of the time was spent in counseling/coordination of care  Lorella Nimrod, MD [facility]  If 7PM-7AM, please contact night-coverage [email or web address]  12/30/2020, 3:02 PM   This record has been created using Systems analyst. Errors have been sought and corrected,but may not always be located. Such creation errors do not reflect on the standard of care.

## 2020-12-30 NOTE — Progress Notes (Signed)
Progress Note  Patient Name: [name] Date of Encounter: 12/30/2020  [facility] Cardiologist: Nelva Bush, MD   Subjective   Patient lying in bed and belligerent when asked how she is feeling.  She states only that she is ready to go get her dinner.  She denies complaints other than wanting to leave the hospital.  She has been refusing medications as well as telemetry monitoring.  When asked if she would consider taking medications, she states that she needs to ask her husband (he died earlier this year).  Inpatient Medications    Scheduled Meds:  aspirin EC  81 mg Oral Daily   chlorhexidine  15 mL Mouth Rinse BID   cholecalciferol  2,000 Units Oral Daily   donepezil  5 mg Oral QHS   mouth rinse  15 mL Mouth Rinse q12n4p   metoprolol tartrate  75 mg Oral BID   mirtazapine  7.5 mg Oral QHS   molnupiravir EUA  4 capsule Oral BID   nystatin  1 application Topical TID   QUEtiapine  25 mg Oral QHS   Rivaroxaban  15 mg Oral Q supper   senna-docusate  1 tablet Oral BID   cyanocobalamin  1,000 mcg Oral Daily   Continuous Infusions:  PRN Meds: acetaminophen, acetaminophen, albuterol, dextromethorphan-guaiFENesin, haloperidol lactate, hydrALAZINE, ondansetron (ZOFRAN) IV, traZODone   Vital Signs    Vitals:   12/29/20 0925 12/29/20 1216 12/30/20 0507 12/30/20 0747  BP: (!) 147/66 131/74  131/65  Pulse: (!) 55 88  99  Resp: 18 18    Temp: 98 F (36.7 C) 97.9 F (36.6 C)    TempSrc:      SpO2: 97% 98%  97%  Weight:   86 kg   Height:        Intake/Output Summary (Last 24 hours) at 12/30/2020 1033 Last data filed at 12/29/2020 1300 Gross per 24 hour  Intake 120 ml  Output --  Net 120 ml   Last 3 Weights 12/30/2020 12/29/2020 12/29/2020  Weight (lbs) 189 lb 9.5 oz 189 lb 9.5 oz 189 lb 9.5 oz  Weight (kg) 86 kg 86 kg 86 kg      Telemetry    Sinus rhythm until approximately 2315 yesterday when telemetry was removed. - Personally Reviewed  ECG    No  new tracing.  Physical Exam   GEN: No acute distress.   Neck: Unable to assess due to supine positioning. Cardiac: RRR with 2/6 systolic murmur. Respiratory: Clear anteriorly.  Patient unwilling/unable to sit up for posterior auscultation. GI: Soft, nontender, non-distended  MS: No edema; No deformity. Neuro:  Nonfocal  Psych: Flat affect.  Labs    High Sensitivity Troponin:   Recent Labs  Lab 12/27/20 1313 12/28/20 1736  TROPONINIHS 70* 53*     Chemistry Recent Labs  Lab 12/27/20 1313 12/28/20 1735  NA 138 139  K 3.6 3.7  CL 106 106  CO2 26 26  GLUCOSE 168* 140*  BUN 26* 35*  CREATININE 1.89* 2.05*  CALCIUM 9.1 9.1  PROT 7.6  --   ALBUMIN 2.4*  --   AST 19  --   ALT 9  --   ALKPHOS 64  --   BILITOT 0.3  --   GFRNONAA 27* 25*  ANIONGAP 6 7    Lipids  Recent Labs  Lab 12/28/20 1735  CHOL 161  TRIG 179*  HDL 26*  LDLCALC 99  CHOLHDL 6.2    Hematology Recent Labs  Lab  12/27/20 1313 12/28/20 1735 12/29/20 0645  WBC 8.9 9.1 9.2  RBC 3.63* 3.35* 3.17*  HGB 10.3* 9.3* 8.8*  HCT 33.2* 30.4* 28.3*  MCV 91.5 90.7 89.3  MCH 28.4 27.8 27.8  MCHC 31.0 30.6 31.1  RDW 14.1 14.3 14.3  PLT 331 290 255   Thyroid  Recent Labs  Lab 12/28/20 1736  TSH 0.719    BNP Recent Labs  Lab 12/27/20 1313  BNP 128.9*    DDimer No results for input(s): DDIMER in the last 168 hours.   Radiology    No results found.  Cardiac Studies   No new studies.  Patient Profile     75 y.o. female hypertension, diabetes, mild aortic stenosis, nonobstructive CAD presenting with fatigue, diagnosed with COVID-19 and newly diagnosed atrial fibrillation with rapid ventricular response.  Assessment & Plan    Atrial fibrillation: Noted on admission EKG but not present when patient was connected to telemetry.  No further evidence of atrial fibrillation when she has allowed telemetry monitoring.  IV heparin was ordered at 1 point with transition to NOAC.  However, patient  has refused all oral medications including this and metoprolol.  She would benefit from anticoagulation in the setting of a CHA2DS2-VASc score of at least 6.  However, given her refusal to take medications and failure to thrive with ongoing discussions regarding hospice care, I think it is reasonable to defer anticoagulation at this time.  Chronic HFrEF with recovered ejection fraction: Ms. Spillman appears grossly euvolemic.  She is not on any medications at this time due to her refusal.  She is not eating or drinking either.  Ideally, we would restart metoprolol.  No indications for diuresis at this time.  Aortic stenosis: Mild by most recent echo on 04/2019.  No further work-up at this time.  Disposition: Patient refusing to take all medications as well as eat.  When asked why, she states only that she does not need them.  I advised her that she is at risk for stroke in the setting of her paroxysmal atrial fibrillation and that anticoagulation is indicated.  She states that she needs to discuss this with her husband, who passed away a few months ago.  She has been evaluated by psychiatry and palliative care with ongoing plans for home versus inpatient hospice.  [facility] will sign off.   Medication Recommendations: Defer all cardiac medications given patient's refusal to take medications and plans for hospice care. Other recommendations (labs, testing, etc): None. Follow up as an outpatient: As needed.  For questions or updates, please contact [place] Please consult [email or web address] for contact info under [facility].     Signed, Nelva Bush, MD  12/30/2020, 10:33 AM

## 2020-12-30 NOTE — Progress Notes (Signed)
Patient is awake and in bed watching TV when I entered the room.  She is refusing her breakfast tray, AM medications and to allow me to do a nursing assessment of her.  Her tele monitor is on the bedside table and she will not allow me to place her back on the monitoring equipment.  Dr. Reesa Chew has been notified via secure chat.  Lysbeth Galas, RN (Camera operator) notified also.

## 2020-12-30 NOTE — Progress Notes (Signed)
[place] [facility]) Hospital Liaison Note  Received request from Transitions of Care Manager Pricilla Riffle, LCSW for family interest in [place]. Spoke with daughter Meris Reede to confirm interest and explain services.  Approval for Hospice Home is determined by [facility] MD. Eligibility will be determined once Covid quarantine has ended. Family and TOC aware. Once [facility] MD has determined Hospice Home eligibility, [facility] will update hospital staff and family.  Please do not hesitate to call with any hospice related questions.    Thank you for the opportunity to participate in this patient's care.   Bobbie "Loren Racer, RN, BSN [facility] Liaison [phone]

## 2020-12-30 NOTE — Progress Notes (Signed)
Patient refused nursing assessment and medication, along with vital signs and FSBS.  Family at bedside and made aware.  MD notified via secure chat.  Patient is verbally and physically aggressive when staff tried to move or check patient for incontinence.

## 2020-12-30 NOTE — Progress Notes (Signed)
Rounded on pt, Pt denied any needs.

## 2020-12-30 NOTE — Progress Notes (Signed)
Patient refused 0400 vitals. Still refusing to allow staff to put back on telemetry. MD notified.

## 2020-12-30 NOTE — Progress Notes (Signed)
OT Cancellation Note  Patient Details Name: [name] MRN: [id number] DOB: 10-29-1945   Cancelled Treatment:    Reason Eval/Treat Not Completed: Other (comment) (OT order discontiued by practitioner. Per chart review pt is refusing all care. Not approrpiate for OT at this time. Please reconsult if there is a change in functional status.) OT will sign off at this time.   Shanon Payor, OTD OTR/L  12/30/20, 2:13 PM

## 2020-12-30 NOTE — Progress Notes (Signed)
PT Cancellation Note  Patient Details Name: [name] MRN: [id number] DOB: 07-03-1945   Cancelled Treatment:    Reason Eval/Treat Not Completed: Other (comment). Patient has been refusing all care/meds. Not appropriate for PT at this time. Family is looking into hospice for patient. Will sign off at this time, if situation changes, please re-order if appropriate.     Romero Letizia 12/30/2020, 11:57 AM

## 2020-12-30 NOTE — Progress Notes (Signed)
Progress note:  After reviewing the patient's chart, epic notes, labs, and imaging, I responded to phone call message from patient's daughter.  I spoke to [place] over the phone and we establish to meet in the [place] at 1215 today to discuss the patient's plan of care.  I met with patient's daughter Caroline/HC POA, patient's sister and her husband in the [place].  They shared that they have spoken extensively with the patient's family and that they would all like to have patient evaluated for hospice inpatient unit placement.  I conveyed that patient will not be evaluated for hospice inpatient unit until her 10 days of COVID isolation and quarantine has ended.  Full comfort measures versus aggressive medical treatment reviewed with the patient's daughter and family present.  All for members present confirm they like to move forward with a full comfort pathway.  I outlined in detail that a comfort pathway means only medications and interventions focused on keeping the patient comfortable, clean, dry, and with his much compassion and dignity as possible.  I shared my concern that since the patient is not eating or drinking that she is at risk for passing away in the hospital.  Patient's daughter and family present acknowledge that a hospital death could be anticipated.  Therapeutic silence, active listening, and space provided for patient's daughter and family to share their thoughts and emotions regarding the patient's significant decline over the last few months.  I reviewed that dementia is a progressive and chronic disease that often has a significant decline it once end-of-life.  Patient's daughter shares that the patient had a large meal and surprised everyone about this time last week.  Patient's daughter thought this was the "rise before the fall".  Full comfort measures put in place.  Dr. Reesa Chew, [facility] social worker, nursing all made aware that patient will move forward with full comfort measures  with assumption that she will be evaluated for hospice inpatient unit when her quarantine time ends.  Dr. Reesa Chew asked if patient's quarantine time could be shortened since patient is asymptomatic and was tested at facility as a routine measure. Lorenza [place] of Hospice shared that [place] will follow hospitals rule on quarantine time.  I spoke with Dr. Delaine Lame of infectious disease as well as Geralyn Corwin of Infection Prevention via secure chat. Elmyra Ricks confirmed that pt must pass 10 days of quarantine.Medical team made aware.   Afternoon update:  I responded to patient's daughters phone call with concerns of nursing care.  I spoke with patient's daughter over the phone.  She shares that patient is agitated and since there is no IV she is concerned that the patient will not be able to receive medications for comfort.  I assured her we would work with sublingual as well as IM injections.  I shared that IM injections can be and painful and that we will only use that route if all of the means have been exhausted.  Patient is daughter shared she will return to the room if the nurse can meet her there in an attempt to give her sublingual Haldol.  Nursing notified the daughter returned to room and attempt to help patient take medication for agitation.  Patient's daughter was appreciative and would like to be kept up-to-date as she understands her mother is at end-of-life.  PLAN:  -Full comfort measures -DNR -Once patient completes 10 days of quarantine patient will be evaluated by hospice for inpatient eligibility  100 minutes  Markey Deady L. Shadasia Oldfield, DNP, FNP-BC  Palliative Medicine Team Team Phone # [phone]  Greater than 50% of this time was spent counseling and coordinating care related to the above assessment and plan.

## 2020-12-31 ENCOUNTER — Other Ambulatory Visit: Payer: Self-pay | Admitting: Family Medicine

## 2020-12-31 DIAGNOSIS — R600 Localized edema: Secondary | ICD-10-CM

## 2020-12-31 LAB — CULTURE, BLOOD (ROUTINE X 2): Special Requests: ADEQUATE

## 2020-12-31 MED ORDER — HALOPERIDOL LACTATE 2 MG/ML PO CONC
0.5000 mg | ORAL | Status: DC | PRN
  Administered 2021-01-06: 0.5 mg via SUBLINGUAL
  Filled 2020-12-31 (×3): qty 0.3

## 2020-12-31 MED ORDER — HALOPERIDOL LACTATE 5 MG/ML IJ SOLN
0.5000 mg | INTRAMUSCULAR | Status: DC | PRN
  Filled 2020-12-31: qty 1

## 2020-12-31 NOTE — Telephone Encounter (Signed)
Requested medication (s) are due for refill today: no  Requested medication (s) are on the active medication list: yes  Last refill:  12/17/20 #30 with 1 refill  Future visit scheduled: yes  Notes to clinic:  Please advise on refill. Pharmacy comment: REQUEST FOR 90 DAYS PRESCRIPTION. Patient is currently hospitalized.    Requested Prescriptions  Pending Prescriptions Disp Refills   furosemide (LASIX) 20 MG tablet [Pharmacy Med Name: FUROSEMIDE 20 MG TABLET] 90 tablet 1    Sig: TAKE 1 TABLET (20 MG TOTAL) BY MOUTH DAILY AS NEEDED FOR FLUID.     Cardiovascular:  Diuretics - Loop Failed - 12/31/2020  8:24 AM      Failed - Cr in normal range and within 360 days    Creat  Date Value Ref Range Status  12/21/2016 1.31 (H) 0.60 - 0.93 mg/dL Final    Comment:    For patients >63 years of age, the reference limit for Creatinine is approximately 13% higher for people identified as African-American. .    Creatinine, Ser  Date Value Ref Range Status  12/28/2020 2.05 (H) 0.44 - 1.00 mg/dL Final   Creatinine, POC  Date Value Ref Range Status  06/22/2016 n/a mg/dL Final          Passed - K in normal range and within 360 days    Potassium  Date Value Ref Range Status  12/28/2020 3.7 3.5 - 5.1 mmol/L Final          Passed - Ca in normal range and within 360 days    Calcium  Date Value Ref Range Status  12/28/2020 9.1 8.9 - 10.3 mg/dL Final          Passed - Na in normal range and within 360 days    Sodium  Date Value Ref Range Status  12/28/2020 139 135 - 145 mmol/L Final  04/28/2020 139 134 - 144 mmol/L Final          Passed - Last BP in normal range    BP Readings from Last 1 Encounters:  12/30/20 (!) 126/57          Passed - Valid encounter within last 6 months    Recent Outpatient Visits           1 month ago Urinary tract infection without hematuria, site unspecified   [facility] Birdie Sons, MD   2 months ago Localized edema    [facility] Birdie Sons, MD   2 months ago Type 2 diabetes mellitus with stage 4 chronic kidney disease, without long-term current use of insulin ([place])   [facility] Birdie Sons, MD   7 months ago Type 2 diabetes mellitus with stage 4 chronic kidney disease, without long-term current use of insulin [facility])   [facility] Birdie Sons, MD   10 months ago Diabetes mellitus with nephropathy [facility])   [facility] Birdie Sons, MD       Future Appointments             In 1 week Fisher, Kirstie Peri, MD [facility], [place]   In 1 month End, Harrell Gave, MD [facility], [place]

## 2020-12-31 NOTE — Progress Notes (Signed)
° °  Chief complaint: shortness of breath/failure to thrive  Subjective: No concerns  BP (!) 126/57 (BP Location: Left Arm)    Pulse (!) 115    Temp 98 F (36.7 C)    Resp 18    Ht 5' (1.524 m)    Wt 86 kg    SpO2 98%    BMI 37.03 kg/m   Physical exam  BP (!) 126/57 (BP Location: Left Arm)    Pulse (!) 115    Temp 98 F (36.7 C)    Resp 18    Ht 5' (1.524 m)    Wt 86 kg    SpO2 98%    BMI 37.03 kg/m   General exam: Appears calm and comfortable   Brief assessment/Plan:  New onset atrial fibrillation with RVR Patient converted to sinus rhythm. Manage don metoprolol but patient refusing medications/care. Now comfort measures.  COVID-19 infection Asymptomatic. Refused Redesivir. Stable. Comfort measures  Elevated troponin Possible demand ischemia in setting of atrial fibrillation with RVR. No chest pain.  CAD Noted.  Primary hypertension Stable. Comfort measures  Chronic HFrEF Stable. Comfort measures  CKD stage IV Stable. Comfort measures  Multiple myeloma Comfort measures  Failure to thrive Comfort measures.  Dementia Comfort measures -Haldol IM for agitation  Depression Comfort measures   Family communication: Daughter at bedside DVT prophylaxis: Comfort measures Disposition: Discharge to hospice facility, likely after 1/5 pending completion of 10 day isolation for COVID-19 infection  Cordelia Poche, MD [facility] 12/31/2020, 5:12 PM

## 2020-12-31 NOTE — TOC Progression Note (Signed)
Transition of Care [facility]) - Progression Note    Patient Details  Name: [name] MRN: [id number] Date of Birth: 12-11-1945  Transition of Care [facility]) CM/SW [place], [place] Phone Number: 12/31/2020, 1:52 PM  Clinical Narrative:     Plan remains for eligibility of Hospice Home to be determined once Covid quarantine has ended.   Home with hospice is not appropriate as limited home support for patient's high level of care needs.   Expected Discharge Plan: [place] Barriers to Discharge: Continued Medical Work up  Expected Discharge Plan and Services Expected Discharge Plan: [place] arrangements for the past 2 months: [place])                                       Social Determinants of Health (SDOH) Interventions    Readmission Risk Interventions No flowsheet data found.

## 2020-12-31 NOTE — Progress Notes (Signed)
Progress note:  After reviewing the chart and epic notes patient had no issues overnight.  Patient is stable and on full comfort measures, meaning no vital signs unless family requests, no telemetry, and no further aggressive medical treatment. Night and day shift nursing made aware.  Patient is a DNR. She is to receive medications and care focused solely on her comfort.  Patient's plan of care now focuses on giving her compassionate and dignified care, keeping her clean and dry, and addressing what ever symptoms may arise.   Palliative medicine team will continue to monitor the patient peripherally. PMT will shadow her chart while patient remains in the hospital to clear her 10 days of quarantine before being able to be evaluated for inpatient hospice placement. Hospice liaison through [place] aware that patient should be evaluated on 01/07/21.   PMT is available through secure chat and Amion.  Please reach out to the team should patient or medical team have palliative needs.  Thank you for including PMT in the care of this patient.  Jillian Ilsa Iha, FNP-BC Palliative Medicine Team Team Phone # [phone]  NO CHARGE

## 2020-12-31 NOTE — Progress Notes (Signed)
Saw orders to discontinue cardiac monitoring and to do vital signs unless family requests it, placed yesterday. Pt not in distress.

## 2020-12-31 NOTE — Progress Notes (Addendum)
Pt refused vital signs. Not in distress. Denies chest pain or difficulty breathing. Pt is also refusing telemetry since the 28th. NP made aware.

## 2020-12-31 NOTE — Progress Notes (Signed)
[place] [facility]) Hospital Liaison Note   Plan remains for eligibility of Hospice Home to be determined once Covid quarantine has ended. Family and TOC aware.    Please do not hesitate to call with any hospice related questions.    Thank you,   Bobbie "Loren Racer, [place], BSN [facility] Liaison [phone]

## 2021-01-01 DIAGNOSIS — Z515 Encounter for palliative care: Secondary | ICD-10-CM

## 2021-01-01 NOTE — Progress Notes (Signed)
° °  Chief complaint: shortness of breath/failure to thrive  Subjective: No issues noted overnight  Objective:  BP (!) 101/49    Pulse (!) 110    Temp 99.5 F (37.5 C) (Oral)    Resp 19    Ht 5' (1.524 m)    Wt 86 kg    SpO2 91%    BMI 37.03 kg/m   Physical exam  General: Well appearing, no distress. Sleeping.   Assessment/Plan:  New onset atrial fibrillation with RVR Patient converted to sinus rhythm. Manage don metoprolol but patient refusing medications/care. Now comfort measures.  COVID-19 infection Asymptomatic. Refused Redesivir. Stable. Comfort measures  Elevated troponin Possible demand ischemia in setting of atrial fibrillation with RVR. No chest pain.  CAD Noted.  Primary hypertension Stable. Comfort measures  Chronic HFrEF Stable. Comfort measures  CKD stage IV Stable. Comfort measures  Multiple myeloma Comfort measures  Failure to thrive Comfort measures.  Dementia Comfort measures -Haldol IM for agitation  Depression Comfort measures   Family communication: None at bedside DVT prophylaxis: Comfort measures Disposition: Discharge to hospice facility, likely after 1/5 pending completion of 10 day isolation for COVID-19 infection  Cordelia Poche, MD [facility] 01/01/2021, 12:12 PM

## 2021-01-02 MED ORDER — MORPHINE SULFATE (CONCENTRATE) 10 MG/0.5ML PO SOLN
10.0000 mg | ORAL | Status: DC | PRN
  Administered 2021-01-05 – 2021-01-06 (×2): 10 mg via ORAL
  Filled 2021-01-02 (×2): qty 0.5

## 2021-01-02 MED ORDER — BLISTEX MEDICATED EX OINT
TOPICAL_OINTMENT | CUTANEOUS | Status: DC | PRN
  Filled 2021-01-02: qty 6.3

## 2021-01-02 NOTE — Progress Notes (Signed)
PROGRESS NOTE    [name]  WUJ:[id number] DOB: 05/28/1945 DOA: 12/27/2020 PCP: Jillian Sons, MD   Brief Narrative: Taken from H&P. [name] is a 76 y.o. female with medical history significant of CKD-IV, hypertension, hyperlipidemia, diet-controlled diabetes, asthma, depression, pulmonary hypertension, CAD, aortic stenosis, dCHF, multiple myeloma, dementia, depression, who presents with shortness of breath. Patient with recent admission earlier this month (12/08/2020-12/15/2020) with recurrent UTIs and associated urinary frequency, anorexia, and agitation superimposed on baseline dementia.  She was discharged to SNF for rehab.  At baseline she is wheelchair dependent but able to transfer herself from bed to wheelchair, rehab purpuras was to go back to her baseline.  Daughter will not be able to help her if she cannot transfer herself.  Per daughter since discharge patient is experiencing worsening fatigue and refusing most of the care and p.o. intake.  For about 5 to 6 days she started refusing all of her medications.  She was tested positive for COVID-19 on 12/27/2020 first at her facility, later confirmed by PCR in ED.  She was also found to have new onset A. fib with RVR on arrival to ED. cardiology was consulted and she was converted to sinus rhythm this morning.  TSH levels pending as patient is refusing labs. CHA2DS2-VASc score of 6, she is currently on heparin infusion and will need NOAC before discharge but she should be able to take it regularly in order to get the benefit.  She was also found to have elevated troponin at 70.  Refusing further labs so no trend yet.  No chest pain.  Admitted for failure to thrive and new onset A. fib with RVR. Palliative care was also consulted.  12/28: Patient continued to refuse food and medicine.  Palliative care talked with the family and they are thinking about considering home hospice versus in patient hospice.  No final decision made  yet. Remained in sinus rhythm-Heparin infusion to be replaced with NOAC if she takes any medicine. Psych also evaluated her and they are not recommending any new changes to her current medication, currently refusing all the meds.  12/29: Family decided to move with comfort measures only.  Patient continued to refuse all p.o. intake including all the meds and food.  Family wants her to go to hospice home and for that she has to complete 10 days of quarantine, tested positive on 12/27/2020 and remained asymptomatic since then. Patient has to wait till 01/06/2021 to complete 10 days of quarantine before moving to hospice home.  Subjective: Patient was seen and examined today.  She wants some water, otherwise refusing all the care and food.  Assessment & Plan:   Principal Problem:   Atrial fibrillation with RVR (HCC) Active Problems:   Type 2 diabetes mellitus with stage 4 chronic kidney disease (HCC)   Essential hypertension   Multiple myeloma not having achieved remission (HCC)   Chronic HFrEF (heart failure with reduced ejection fraction) (HCC)   CKD (chronic kidney disease), stage IV (HCC)   Suspected COVID-19 virus infection   Elevated troponin   Failure to thrive in adult   CAD (coronary artery disease)   Asthma   Dementia ([place])   Depression   Comfort measures only status  New onset A. fib with RVR.   COVID-19 infection.   Elevated troponin/history of CAD.  Essential hypertension.   Chronic HFrEF with normalization of EF.   CKD stage IV.   Multiple myeloma not having achieved remission ([place]) -Follow-up with  oncology  Failure to thrive.  Started after prior discharge to rehab.  History of losing her 17 years of spouse 2 months ago.  Depression might be playing a role.  We obtained psych consult and they do not think that she need any changes to her current medication.  Patient is refusing to take all the meds. Family decided to transition her to comfort care only. Waiting to  complete 10 days of quarantine before proceeding for hospice home.  Objective: Vitals:   12/30/20 2009 12/31/20 0938 01/01/21 1015 01/01/21 1155  BP:   (!) 101/43 (!) 101/49  Pulse:   (!) 115 (!) 110  Resp:   19   Temp:  99.5 F (37.5 C)    TempSrc:  Oral    SpO2: 98% 94% 93% 91%  Weight:      Height:        Intake/Output Summary (Last 24 hours) at 01/02/2021 1656 Last data filed at 01/02/2021 0400 Gross per 24 hour  Intake --  Output 150 ml  Net -150 ml    Filed Weights   12/29/20 0051 12/29/20 0500 12/30/20 0507  Weight: 86 kg 86 kg 86 kg    Examination:  General.  Frail elderly lady, in no acute distress. Pulmonary.  Lungs clear bilaterally, normal respiratory effort. CV.  Regular rate and rhythm, no JVD, rub or murmur. Abdomen.  Soft, nontender, nondistended, BS positive. CNS.  Alert and oriented .  No focal neurologic deficit. Extremities.  No edema, no cyanosis, pulses intact and symmetrical. Psychiatry.  Judgment and insight appears impaired.  DVT prophylaxis: Patient refusing all care, now comfort care Code Status: DNR Family Communication: . Disposition Plan:  Status is: Inpatient  Remains inpatient appropriate because: Severity of illness   Level of care: Med-Surg  All the records are reviewed and case discussed with Care Management/Social Worker. Management plans discussed with the patient, nursing and they are in agreement.  Consultants:  Palliative care Cardiology  Procedures:  Antimicrobials:   Data Reviewed: I have personally reviewed following labs and imaging studies  CBC: Recent Labs  Lab 12/27/20 1313 12/28/20 1735 12/29/20 0645  WBC 8.9 9.1 9.2  HGB 10.3* 9.3* 8.8*  HCT 33.2* 30.4* 28.3*  MCV 91.5 90.7 89.3  PLT 331 290 599    Basic Metabolic Panel: Recent Labs  Lab 12/27/20 1313 12/28/20 1735  NA 138 139  K 3.6 3.7  CL 106 106  CO2 26 26  GLUCOSE 168* 140*  BUN 26* 35*  CREATININE 1.89* 2.05*  CALCIUM 9.1 9.1     GFR: Estimated Creatinine Clearance: 22.7 mL/min (A) (by C-G formula based on SCr of 2.05 mg/dL (H)). Liver Function Tests: Recent Labs  Lab 12/27/20 1313  AST 19  ALT 9  ALKPHOS 64  BILITOT 0.3  PROT 7.6  ALBUMIN 2.4*    No results for input(s): LIPASE, AMYLASE in the last 168 hours. No results for input(s): AMMONIA in the last 168 hours. Coagulation Profile: Recent Labs  Lab 12/28/20 1736  INR 1.1    Cardiac Enzymes: No results for input(s): CKTOTAL, CKMB, CKMBINDEX, TROPONINI in the last 168 hours. BNP (last 3 results) No results for input(s): PROBNP in the last 8760 hours. HbA1C: No results for input(s): HGBA1C in the last 72 hours.  CBG: Recent Labs  Lab 12/29/20 0922 12/30/20 0752  GLUCAP 100* 106*    Lipid Profile: No results for input(s): CHOL, HDL, LDLCALC, TRIG, CHOLHDL, LDLDIRECT in the last 72 hours.  Thyroid Function  Tests: No results for input(s): TSH, T4TOTAL, FREET4, T3FREE, THYROIDAB in the last 72 hours.  Anemia Panel: No results for input(s): VITAMINB12, FOLATE, FERRITIN, TIBC, IRON, RETICCTPCT in the last 72 hours. Sepsis Labs: Recent Labs  Lab 12/27/20 1313 12/28/20 1736  PROCALCITON <0.10  --   LATICACIDVEN  --  1.3     Recent Results (from the past 240 hour(s))  Resp Panel by RT-PCR (Flu A&B, Covid) Nasopharyngeal Swab     Status: Abnormal   Collection Time: 12/27/20  5:29 PM   Specimen: Nasopharyngeal Swab; Nasopharyngeal(NP) swabs in vial transport medium  Result Value Ref Range Status   SARS Coronavirus 2 by RT PCR POSITIVE (A) NEGATIVE Final    Comment: (NOTE) SARS-CoV-2 target nucleic acids are DETECTED.  The SARS-CoV-2 RNA is generally detectable in upper respiratory specimens during the acute phase of infection. Positive results are indicative of the presence of the identified virus, but do not rule out bacterial infection or co-infection with other pathogens not detected by the test. Clinical correlation with  patient history and other diagnostic information is necessary to determine patient infection status. The expected result is Negative.  Fact Sheet for Patients: EntrepreneurPulse.com.au  Fact Sheet for Healthcare Providers: IncredibleEmployment.be  This test is not yet approved or cleared by the [place] FDA and  has been authorized for detection and/or diagnosis of SARS-CoV-2 by FDA under an Emergency Use Authorization (EUA).  This EUA will remain in effect (meaning this test can be used) for the duration of  the COVID-19 declaration under Section 564(b)(1) of the A ct, 21 U.S.C. section 360bbb-3(b)(1), unless the authorization is terminated or revoked sooner.     Influenza A by PCR NEGATIVE NEGATIVE Final   Influenza B by PCR NEGATIVE NEGATIVE Final    Comment: (NOTE) The Xpert Xpress SARS-CoV-2/FLU/RSV plus assay is intended as an aid in the diagnosis of influenza from Nasopharyngeal swab specimens and should not be used as a sole basis for treatment. Nasal washings and aspirates are unacceptable for Xpert Xpress SARS-CoV-2/FLU/RSV testing.  Fact Sheet for Patients: EntrepreneurPulse.com.au  Fact Sheet for Healthcare Providers: IncredibleEmployment.be  This test is not yet approved or cleared by the [place] FDA and has been authorized for detection and/or diagnosis of SARS-CoV-2 by FDA under an Emergency Use Authorization (EUA). This EUA will remain in effect (meaning this test can be used) for the duration of the COVID-19 declaration under Section 564(b)(1) of the Act, 21 U.S.C. section 360bbb-3(b)(1), unless the authorization is terminated or revoked.  Performed at [facility], [address] [place], [place] [address]   Urine Culture     Status: Abnormal   Collection Time: 12/28/20  6:37 AM   Specimen: Urine, Clean Catch  Result Value Ref Range Status   Specimen  Description   Final    URINE, CLEAN CATCH Performed at [facility], [address] [place], [place] [address]    Special Requests   Final    NONE Performed at [facility], [address] [place], [place] [address]    Culture MULTIPLE SPECIES PRESENT, SUGGEST RECOLLECTION (A)  Final   Report Status 12/29/2020 FINAL  Final  Culture, blood (routine x 2)     Status: Abnormal   Collection Time: 12/28/20  5:35 PM   Specimen: BLOOD  Result Value Ref Range Status   Specimen Description   Final    BLOOD RIGHT ANTECUBITAL Performed at [facility], [address] [place], [place] [address]  Special Requests   Final    BOTTLES DRAWN AEROBIC AND ANAEROBIC Blood Culture adequate volume Performed at [facility], [address] [place], [place] [address]    Culture  Setup Time   Final    GRAM POSITIVE COCCI IN BOTH AEROBIC AND ANAEROBIC BOTTLES Organism ID to follow CRITICAL RESULT CALLED TO, READ BACK BY AND VERIFIED WITH: BRANDON BEERS 12/29/20 1600 MU Performed at [facility], [address] [place], [place] [address]    Culture (A)  Final    STAPHYLOCOCCUS EPIDERMIDIS THE SIGNIFICANCE OF ISOLATING THIS ORGANISM FROM A SINGLE SET OF BLOOD CULTURES WHEN MULTIPLE SETS ARE DRAWN IS UNCERTAIN. PLEASE NOTIFY THE MICROBIOLOGY DEPARTMENT WITHIN ONE WEEK IF SPECIATION AND SENSITIVITIES ARE REQUIRED. Performed at [facility], [place] [address], [place], [place] [address]    Report Status 12/31/2020 FINAL  Final  Blood Culture ID Panel (Reflexed)     Status: Abnormal   Collection Time: 12/28/20  5:35 PM  Result Value Ref Range Status   Enterococcus faecalis NOT DETECTED NOT DETECTED Final   Enterococcus Faecium NOT DETECTED NOT DETECTED Final   Listeria monocytogenes NOT DETECTED NOT DETECTED Final   Staphylococcus species DETECTED (A) NOT DETECTED Final    Comment: CRITICAL RESULT CALLED TO, READ BACK BY AND VERIFIED WITH: BRANDON  BEERS 12/29/20 1600 MU    Staphylococcus aureus (BCID) NOT DETECTED NOT DETECTED Final   Staphylococcus epidermidis DETECTED (A) NOT DETECTED Final    Comment: Methicillin (oxacillin) resistant coagulase negative staphylococcus. Possible blood culture contaminant (unless isolated from more than one blood culture draw or clinical case suggests pathogenicity). No antibiotic treatment is indicated for blood  culture contaminants. CRITICAL RESULT CALLED TO, READ BACK BY AND VERIFIED WITH: BRANDON BEERS 12/29/20 1600 MU    Staphylococcus lugdunensis NOT DETECTED NOT DETECTED Final   Streptococcus species NOT DETECTED NOT DETECTED Final   Streptococcus agalactiae NOT DETECTED NOT DETECTED Final   Streptococcus pneumoniae NOT DETECTED NOT DETECTED Final   Streptococcus pyogenes NOT DETECTED NOT DETECTED Final   A.calcoaceticus-baumannii NOT DETECTED NOT DETECTED Final   Bacteroides fragilis NOT DETECTED NOT DETECTED Final   Enterobacterales NOT DETECTED NOT DETECTED Final   Enterobacter cloacae complex NOT DETECTED NOT DETECTED Final   Escherichia coli NOT DETECTED NOT DETECTED Final   Klebsiella aerogenes NOT DETECTED NOT DETECTED Final   Klebsiella oxytoca NOT DETECTED NOT DETECTED Final   Klebsiella pneumoniae NOT DETECTED NOT DETECTED Final   Proteus species NOT DETECTED NOT DETECTED Final   Salmonella species NOT DETECTED NOT DETECTED Final   Serratia marcescens NOT DETECTED NOT DETECTED Final   Haemophilus influenzae NOT DETECTED NOT DETECTED Final   Neisseria meningitidis NOT DETECTED NOT DETECTED Final   Pseudomonas aeruginosa NOT DETECTED NOT DETECTED Final   Stenotrophomonas maltophilia NOT DETECTED NOT DETECTED Final   Candida albicans NOT DETECTED NOT DETECTED Final   Candida auris NOT DETECTED NOT DETECTED Final   Candida glabrata NOT DETECTED NOT DETECTED Final   Candida krusei NOT DETECTED NOT DETECTED Final   Candida parapsilosis NOT DETECTED NOT DETECTED Final   Candida  tropicalis NOT DETECTED NOT DETECTED Final   Cryptococcus neoformans/gattii NOT DETECTED NOT DETECTED Final   Methicillin resistance mecA/C DETECTED (A) NOT DETECTED Final    Comment: CRITICAL RESULT CALLED TO, READ BACK BY AND VERIFIED WITH: BRANDON BEERS 12/29/20 1600 MU Performed at [facility], [address] [place], [place] [address]   Culture, blood (Routine X 2) w Reflex to  ID Panel     Status: None (Preliminary result)   Collection Time: 12/29/20 12:42 AM   Specimen: BLOOD  Result Value Ref Range Status   Specimen Description BLOOD LEFT ASSIST CONTROL  Final   Special Requests   Final    IN PEDIATRIC BOTTLE Blood Culture results may not be optimal due to an excessive volume of blood received in culture bottles   Culture   Final    NO GROWTH 4 DAYS Performed at [facility], [address], [place], [place] [address]    Report Status PENDING  Incomplete      Radiology Studies: No results found.  Scheduled Meds:   Continuous Infusions:     LOS: 5 days   Time spent: 35 minutes. More than 50% of the time was spent in counseling/coordination of care  Lorella Nimrod, MD [facility]  If 7PM-7AM, please contact night-coverage [email or web address]  01/02/2021, 4:56 PM   This record has been created using Systems analyst. Errors have been sought and corrected,but may not always be located. Such creation errors do not reflect on the standard of care.

## 2021-01-02 NOTE — Progress Notes (Signed)
Pt continues to refuse care. Three staff members were able to clean her. She yelled the whole time for [place] to stop touching her and to get out of her room. Pt was lying in stool and urine. A complete bath and linen change were done. She refused to drink and she denied pain. I will continue to monitor her.

## 2021-01-02 NOTE — Plan of Care (Signed)
°  Problem: Coping: °Goal: Level of anxiety will decrease °Outcome: Progressing °  °

## 2021-01-02 NOTE — Progress Notes (Signed)
Patient has been refusing care all day. She has refused vitals and having any ADLs performed on her. Patient pulled her purewick off and she has had a bowel movement and has urinated on herself, patient will not let anyone touch her or change her. The daughter is in the room and the patient will not let her daughter touch her either. Daughter is aware of the urine and feces being on the patient, she told us to do what we could but the patient again would not let us touch her at all. Patient is not eating as well. She is c/o pain, messaged MD.

## 2021-01-03 NOTE — Progress Notes (Signed)
Patient has refused all care from me today. She is disoriented x4. During hygiene and basic patient care is is combative and verbally aggressive to staff.

## 2021-01-03 NOTE — Progress Notes (Signed)
PROGRESS NOTE    [name]  KWI:[id number] DOB: 1945-09-08 DOA: 12/27/2020 PCP: Birdie Sons, MD   Brief Narrative: Taken from H&P. [name] is a 76 y.o. female with medical history significant of CKD-IV, hypertension, hyperlipidemia, diet-controlled diabetes, asthma, depression, pulmonary hypertension, CAD, aortic stenosis, dCHF, multiple myeloma, dementia, depression, who presents with shortness of breath. Patient with recent admission earlier this month (12/08/2020-12/15/2020) with recurrent UTIs and associated urinary frequency, anorexia, and agitation superimposed on baseline dementia.  She was discharged to SNF for rehab.  At baseline she is wheelchair dependent but able to transfer herself from bed to wheelchair, rehab purpuras was to go back to her baseline.  Daughter will not be able to help her if she cannot transfer herself.  Per daughter since discharge patient is experiencing worsening fatigue and refusing most of the care and p.o. intake.  For about 5 to 6 days she started refusing all of her medications.  She was tested positive for COVID-19 on 12/27/2020 first at her facility, later confirmed by PCR in ED.  She was also found to have new onset A. fib with RVR on arrival to ED. cardiology was consulted and she was converted to sinus rhythm this morning.  TSH levels pending as patient is refusing labs. CHA2DS2-VASc score of 6, she is currently on heparin infusion and will need NOAC before discharge but she should be able to take it regularly in order to get the benefit.  She was also found to have elevated troponin at 70.  Refusing further labs so no trend yet.  No chest pain.  Admitted for failure to thrive and new onset A. fib with RVR. Palliative care was also consulted.  12/28: Patient continued to refuse food and medicine.  Palliative care talked with the family and they are thinking about considering home hospice versus in patient hospice.  No final decision made  yet. Remained in sinus rhythm-Heparin infusion to be replaced with NOAC if she takes any medicine. Psych also evaluated her and they are not recommending any new changes to her current medication, currently refusing all the meds.  12/29: Family decided to move with comfort measures only.  Patient continued to refuse all p.o. intake including all the meds and food.  Family wants her to go to hospice home and for that she has to complete 10 days of quarantine, tested positive on 12/27/2020 and remained asymptomatic since then. Patient has to wait till 01/06/2021 to complete 10 days of quarantine before moving to hospice home.  Subjective: Patient was quite agitated when seen today.  Stating that do not touch me and refusing the physical exam.  Assessment & Plan:   Principal Problem:   Atrial fibrillation with RVR (HCC) Active Problems:   Type 2 diabetes mellitus with stage 4 chronic kidney disease (HCC)   Essential hypertension   Multiple myeloma not having achieved remission (HCC)   Chronic HFrEF (heart failure with reduced ejection fraction) (HCC)   CKD (chronic kidney disease), stage IV (HCC)   Suspected COVID-19 virus infection   Elevated troponin   Failure to thrive in adult   CAD (coronary artery disease)   Asthma   Dementia ([place])   Depression   Comfort measures only status  New onset A. fib with RVR.   COVID-19 infection.   Elevated troponin/history of CAD.  Essential hypertension.   Chronic HFrEF with normalization of EF.   CKD stage IV.   Multiple myeloma not having achieved remission ([place]) -Follow-up  with oncology  Failure to thrive.  Started after prior discharge to rehab.  History of losing her 53 years of spouse 2 months ago.  Depression might be playing a role.  We obtained psych consult and they do not think that she need any changes to her current medication.  Patient is refusing to take all the meds. Family decided to transition her to comfort care only. Waiting  to complete 10 days of quarantine before proceeding for hospice home.  Objective: Vitals:   12/30/20 2009 12/31/20 0938 01/01/21 1015 01/01/21 1155  BP:   (!) 101/43 (!) 101/49  Pulse:   (!) 115 (!) 110  Resp:   19   TempSrc:  Oral    SpO2: 98% 94% 93% 91%  Weight:      Height:       No intake or output data in the 24 hours ending 01/03/21 1542  Filed Weights   12/29/20 0051 12/29/20 0500 12/30/20 0507  Weight: 86 kg 86 kg 86 kg    Examination:  No physical exam done as patient refused.  DVT prophylaxis: Patient refusing all care, now comfort care Code Status: DNR Family Communication: . Disposition Plan:  Status is: Inpatient  Remains inpatient appropriate because: Severity of illness   Level of care: Med-Surg  All the records are reviewed and case discussed with Care Management/Social Worker. Management plans discussed with the patient, nursing and they are in agreement.  Consultants:  Palliative care Cardiology  Procedures:  Antimicrobials:   Data Reviewed: I have personally reviewed following labs and imaging studies  CBC: Recent Labs  Lab 12/28/20 1735 12/29/20 0645  WBC 9.1 9.2  HGB 9.3* 8.8*  HCT 30.4* 28.3*  MCV 90.7 89.3  PLT 290 673    Basic Metabolic Panel: Recent Labs  Lab 12/28/20 1735  NA 139  K 3.7  CL 106  CO2 26  GLUCOSE 140*  BUN 35*  CREATININE 2.05*  CALCIUM 9.1    GFR: Estimated Creatinine Clearance: 22.7 mL/min (A) (by C-G formula based on SCr of 2.05 mg/dL (H)). Liver Function Tests: No results for input(s): AST, ALT, ALKPHOS, BILITOT, PROT, ALBUMIN in the last 168 hours.  No results for input(s): LIPASE, AMYLASE in the last 168 hours. No results for input(s): AMMONIA in the last 168 hours. Coagulation Profile: Recent Labs  Lab 12/28/20 1736  INR 1.1    Cardiac Enzymes: No results for input(s): CKTOTAL, CKMB, CKMBINDEX, TROPONINI in the last 168 hours. BNP (last 3 results) No results for input(s): PROBNP  in the last 8760 hours. HbA1C: No results for input(s): HGBA1C in the last 72 hours.  CBG: Recent Labs  Lab 12/29/20 0922 12/30/20 0752  GLUCAP 100* 106*    Lipid Profile: No results for input(s): CHOL, HDL, LDLCALC, TRIG, CHOLHDL, LDLDIRECT in the last 72 hours.  Thyroid Function Tests: No results for input(s): TSH, T4TOTAL, FREET4, T3FREE, THYROIDAB in the last 72 hours.  Anemia Panel: No results for input(s): VITAMINB12, FOLATE, FERRITIN, TIBC, IRON, RETICCTPCT in the last 72 hours. Sepsis Labs: Recent Labs  Lab 12/28/20 1736  LATICACIDVEN 1.3     Recent Results (from the past 240 hour(s))  Resp Panel by RT-PCR (Flu A&B, Covid) Nasopharyngeal Swab     Status: Abnormal   Collection Time: 12/27/20  5:29 PM   Specimen: Nasopharyngeal Swab; Nasopharyngeal(NP) swabs in vial transport medium  Result Value Ref Range Status   SARS Coronavirus 2 by RT PCR POSITIVE (A) NEGATIVE Final    Comment: (  NOTE) SARS-CoV-2 target nucleic acids are DETECTED.  The SARS-CoV-2 RNA is generally detectable in upper respiratory specimens during the acute phase of infection. Positive results are indicative of the presence of the identified virus, but do not rule out bacterial infection or co-infection with other pathogens not detected by the test. Clinical correlation with patient history and other diagnostic information is necessary to determine patient infection status. The expected result is Negative.  Fact Sheet for Patients: EntrepreneurPulse.com.au  Fact Sheet for Healthcare Providers: IncredibleEmployment.be  This test is not yet approved or cleared by the [place] FDA and  has been authorized for detection and/or diagnosis of SARS-CoV-2 by FDA under an Emergency Use Authorization (EUA).  This EUA will remain in effect (meaning this test can be used) for the duration of  the COVID-19 declaration under Section 564(b)(1) of the A ct,  21 U.S.C. section 360bbb-3(b)(1), unless the authorization is terminated or revoked sooner.     Influenza A by PCR NEGATIVE NEGATIVE Final   Influenza B by PCR NEGATIVE NEGATIVE Final    Comment: (NOTE) The Xpert Xpress SARS-CoV-2/FLU/RSV plus assay is intended as an aid in the diagnosis of influenza from Nasopharyngeal swab specimens and should not be used as a sole basis for treatment. Nasal washings and aspirates are unacceptable for Xpert Xpress SARS-CoV-2/FLU/RSV testing.  Fact Sheet for Patients: EntrepreneurPulse.com.au  Fact Sheet for Healthcare Providers: IncredibleEmployment.be  This test is not yet approved or cleared by the [place] FDA and has been authorized for detection and/or diagnosis of SARS-CoV-2 by FDA under an Emergency Use Authorization (EUA). This EUA will remain in effect (meaning this test can be used) for the duration of the COVID-19 declaration under Section 564(b)(1) of the Act, 21 U.S.C. section 360bbb-3(b)(1), unless the authorization is terminated or revoked.  Performed at [facility], [address], [place], [place] [address]   Urine Culture     Status: Abnormal   Collection Time: 12/28/20  6:37 AM   Specimen: Urine, Clean Catch  Result Value Ref Range Status   Specimen Description   Final    URINE, CLEAN CATCH Performed at [facility], [address] [place], [place] [address]    Special Requests   Final    NONE Performed at [facility], [address] [place], [place] [address]    Culture MULTIPLE SPECIES PRESENT, SUGGEST RECOLLECTION (A)  Final   Report Status 12/29/2020 FINAL  Final  Culture, blood (routine x 2)     Status: Abnormal   Collection Time: 12/28/20  5:35 PM   Specimen: BLOOD  Result Value Ref Range Status   Specimen Description   Final    BLOOD RIGHT ANTECUBITAL Performed at [facility], [address] [place], [place] [address]     Special Requests   Final    BOTTLES DRAWN AEROBIC AND ANAEROBIC Blood Culture adequate volume Performed at [facility], [address] [place], [place] [address]    Culture  Setup Time   Final    GRAM POSITIVE COCCI IN BOTH AEROBIC AND ANAEROBIC BOTTLES Organism ID to follow CRITICAL RESULT CALLED TO, READ BACK BY AND VERIFIED WITH: BRANDON BEERS 12/29/20 1600 MU Performed at [facility], [address] [place],  [address]    Culture (A)  Final    STAPHYLOCOCCUS EPIDERMIDIS THE SIGNIFICANCE OF ISOLATING THIS ORGANISM FROM A SINGLE SET OF BLOOD CULTURES WHEN MULTIPLE SETS ARE DRAWN IS UNCERTAIN. PLEASE NOTIFY THE MICROBIOLOGY DEPARTMENT WITHIN ONE  WEEK IF SPECIATION AND SENSITIVITIES ARE REQUIRED. Performed at [facility], [place] [address] [place], [place] [address]    Report Status 12/31/2020 FINAL  Final  Blood Culture ID Panel (Reflexed)     Status: Abnormal   Collection Time: 12/28/20  5:35 PM  Result Value Ref Range Status   Enterococcus faecalis NOT DETECTED NOT DETECTED Final   Enterococcus Faecium NOT DETECTED NOT DETECTED Final   Listeria monocytogenes NOT DETECTED NOT DETECTED Final   Staphylococcus species DETECTED (A) NOT DETECTED Final    Comment: CRITICAL RESULT CALLED TO, READ BACK BY AND VERIFIED WITH: BRANDON BEERS 12/29/20 1600 MU    Staphylococcus aureus (BCID) NOT DETECTED NOT DETECTED Final   Staphylococcus epidermidis DETECTED (A) NOT DETECTED Final    Comment: Methicillin (oxacillin) resistant coagulase negative staphylococcus. Possible blood culture contaminant (unless isolated from more than one blood culture draw or clinical case suggests pathogenicity). No antibiotic treatment is indicated for blood  culture contaminants. CRITICAL RESULT CALLED TO, READ BACK BY AND VERIFIED WITH: BRANDON BEERS 12/29/20 1600 MU    Staphylococcus lugdunensis NOT DETECTED NOT DETECTED Final   Streptococcus species NOT DETECTED NOT  DETECTED Final   Streptococcus agalactiae NOT DETECTED NOT DETECTED Final   Streptococcus pneumoniae NOT DETECTED NOT DETECTED Final   Streptococcus pyogenes NOT DETECTED NOT DETECTED Final   A.calcoaceticus-baumannii NOT DETECTED NOT DETECTED Final   Bacteroides fragilis NOT DETECTED NOT DETECTED Final   Enterobacterales NOT DETECTED NOT DETECTED Final   Enterobacter cloacae complex NOT DETECTED NOT DETECTED Final   Escherichia coli NOT DETECTED NOT DETECTED Final   Klebsiella aerogenes NOT DETECTED NOT DETECTED Final   Klebsiella oxytoca NOT DETECTED NOT DETECTED Final   Klebsiella pneumoniae NOT DETECTED NOT DETECTED Final   Proteus species NOT DETECTED NOT DETECTED Final   Salmonella species NOT DETECTED NOT DETECTED Final   Serratia marcescens NOT DETECTED NOT DETECTED Final   Haemophilus influenzae NOT DETECTED NOT DETECTED Final   Neisseria meningitidis NOT DETECTED NOT DETECTED Final   Pseudomonas aeruginosa NOT DETECTED NOT DETECTED Final   Stenotrophomonas maltophilia NOT DETECTED NOT DETECTED Final   Candida albicans NOT DETECTED NOT DETECTED Final   Candida auris NOT DETECTED NOT DETECTED Final   Candida glabrata NOT DETECTED NOT DETECTED Final   Candida krusei NOT DETECTED NOT DETECTED Final   Candida parapsilosis NOT DETECTED NOT DETECTED Final   Candida tropicalis NOT DETECTED NOT DETECTED Final   Cryptococcus neoformans/gattii NOT DETECTED NOT DETECTED Final   Methicillin resistance mecA/C DETECTED (A) NOT DETECTED Final    Comment: CRITICAL RESULT CALLED TO, READ BACK BY AND VERIFIED WITH: BRANDON BEERS 12/29/20 1600 MU Performed at [facility], [address] [place], [place] [address]   Culture, blood (Routine X 2) w Reflex to ID Panel     Status: None (Preliminary result)   Collection Time: 12/29/20 12:42 AM   Specimen: BLOOD  Result Value Ref Range Status   Specimen Description BLOOD LEFT ASSIST CONTROL  Final   Special Requests   Final    IN  PEDIATRIC BOTTLE Blood Culture results may not be optimal due to an excessive volume of blood received in culture bottles   Culture   Final    NO GROWTH 4 DAYS Performed at [facility], [address] [place], [place] [address]    Report Status PENDING  Incomplete      Radiology Studies: No results found.  Scheduled Meds:   Continuous Infusions:  LOS: 6 days   Time spent: 25 minutes. More than 50% of the time was spent in counseling/coordination of care  Lorella Nimrod, MD [facility]  If 7PM-7AM, please contact night-coverage [email or web address]  01/03/2021, 3:42 PM   This record has been created using Systems analyst. Errors have been sought and corrected,but may not always be located. Such creation errors do not reflect on the standard of care.

## 2021-01-03 NOTE — Progress Notes (Signed)
[place] [facility]) Hospital Liaison Note   Plan remains for eligibility of Hospice Home to be determined once Covid quarantine has ended. Family and TOC aware.    Please do not hesitate to call with any hospice related questions.    Thank you,   Bobbie "Loren Racer, [place], BSN [facility] Liaison [phone]

## 2021-01-03 NOTE — Care Management Important Message (Signed)
Important Message  Patient Details  Name: [name] MRN: [id number] Date of Birth: 1945/12/25   Medicare Important Message Given:  Other (see comment)  Patient is in on comfort care, COVID positive. Quarantine until 1/5 before proceeding to [place] per MD. Out of respect for the patient and family no Important Message from [facility] given.    [name] A Zhuri Krass 01/03/2021, 9:05 AM

## 2021-01-04 ENCOUNTER — Telehealth: Payer: Self-pay

## 2021-01-04 LAB — CULTURE, BLOOD (ROUTINE X 2): Culture: NO GROWTH

## 2021-01-04 NOTE — Progress Notes (Signed)
[place] [facility]) Hospital Liaison Note   Plan remains for eligibility of Hospice Home to be determined once Covid quarantine has ended. Spoke with daughter Defranco for update.     Please do not hesitate to call with any hospice related questions.    Thank you,   Bobbie "Loren Racer, [place], BSN [facility] Liaison [phone]

## 2021-01-04 NOTE — TOC Progression Note (Signed)
Transition of Care [facility]) - Progression Note    Patient Details  Name: [name] MRN: [id number] Date of Birth: April 10, 1945  Transition of Care [facility]) CM/SW [place], RN Phone Number: 01/04/2021, 1:34 PM  Clinical Narrative:   Awaiting end of quarantine period for COVID and [facility] will evaluate for Hospice home TOC to follow    Expected Discharge Plan: [place] Barriers to Discharge: Continued Medical Work up  Expected Discharge Plan and Services Expected Discharge Plan: [place] arrangements for the past 2 months: [place] ([place])                                       Social Determinants of Health (SDOH) Interventions    Readmission Risk Interventions No flowsheet data found.

## 2021-01-04 NOTE — Telephone Encounter (Signed)
Copied from [place] [phone]. Topic: General - Other >> Jan 04, 2021 11:32 AM Erick Blinks wrote: Reason for CRM: Pt's daughter called requesting an extension for family medical leave. She needs to extend this an extra month. Extend the date to February the 16th for now, the patient will be moving in to the pt's daughter's home for palliative care.   Best contact: [phone]   Pt's daughter needs this by Friday

## 2021-01-04 NOTE — Progress Notes (Signed)
PROGRESS NOTE    [name]  GOT:[id number] DOB: [birth year] DOA: 12/27/2020 PCP: Birdie Sons, MD   Brief Narrative: Taken from H&P. [name] is a 76 y.o. female with medical history significant of CKD-IV, hypertension, hyperlipidemia, diet-controlled diabetes, asthma, depression, pulmonary hypertension, CAD, aortic stenosis, dCHF, multiple myeloma, dementia, depression, who presents with shortness of breath. Patient with recent admission earlier this month (12/08/2020-12/15/2020) with recurrent UTIs and associated urinary frequency, anorexia, and agitation superimposed on baseline dementia.  She was discharged to SNF for rehab.  At baseline she is wheelchair dependent but able to transfer herself from bed to wheelchair, rehab purpuras was to go back to her baseline.  Daughter will not be able to help her if she cannot transfer herself.  Per daughter since discharge patient is experiencing worsening fatigue and refusing most of the care and p.o. intake.  For about 5 to 6 days she started refusing all of her medications.  She was tested positive for COVID-19 on 12/27/2020 first at her facility, later confirmed by PCR in ED.  She was also found to have new onset A. fib with RVR on arrival to ED. cardiology was consulted and she was converted to sinus rhythm this morning.  TSH levels pending as patient is refusing labs. CHA2DS2-VASc score of 6, she is currently on heparin infusion and will need NOAC before discharge but she should be able to take it regularly in order to get the benefit.  She was also found to have elevated troponin at 70.  Refusing further labs so no trend yet.  No chest pain.  Admitted for failure to thrive and new onset A. fib with RVR. Palliative care was also consulted.  12/28: Patient continued to refuse food and medicine.  Palliative care talked with the family and they are thinking about considering home hospice versus in patient hospice.  No final decision made  yet. Remained in sinus rhythm-Heparin infusion to be replaced with NOAC if she takes any medicine. Psych also evaluated her and they are not recommending any new changes to her current medication, currently refusing all the meds.  12/29: Family decided to move with comfort measures only.  Patient continued to refuse all p.o. intake including all the meds and food.  Family wants her to go to hospice home and for that she has to complete 10 days of quarantine, tested positive on 12/27/2020 and remained asymptomatic since then. Patient has to wait till 01/06/2021 to complete 10 days of quarantine before moving to hospice home.  Subjective: Patient continued to refuse care.  She did let me auscultate her today.  Refusing all the meds.  Earlier she complained about pain with nursing staff but refused to take pain medicine.  Assessment & Plan:   Principal Problem:   Atrial fibrillation with RVR (HCC) Active Problems:   Type 2 diabetes mellitus with stage 4 chronic kidney disease (HCC)   Essential hypertension   Multiple myeloma not having achieved remission (HCC)   Chronic HFrEF (heart failure with reduced ejection fraction) (HCC)   CKD (chronic kidney disease), stage IV (HCC)   Suspected COVID-19 virus infection   Elevated troponin   Failure to thrive in adult   CAD (coronary artery disease)   Asthma   Dementia ([place])   Depression   Comfort measures only status  New onset A. fib with RVR.   COVID-19 infection.   Elevated troponin/history of CAD.  Essential hypertension.   Chronic HFrEF with normalization of EF.  CKD stage IV.   Multiple myeloma not having achieved remission ([place]) -Follow-up with oncology  Failure to thrive.  Started after prior discharge to rehab.  History of losing her 16 years of spouse 2 months ago.  Depression might be playing a role.  We obtained psych consult and they do not think that she need any changes to her current medication.  Patient is refusing to take  all the meds. Family decided to transition her to comfort care only. Waiting to complete 10 days of quarantine before proceeding for hospice home.  Will complete on 01/06/2021.  Objective: Vitals:   12/31/20 0938 01/01/21 1015 01/01/21 1155 01/04/21 0459  BP:  (!) 101/43 (!) 101/49   Pulse:  (!) 115 (!) 110   Resp:  19  20  SpO2: 94% 93% 91%   Weight:      Height:       No intake or output data in the 24 hours ending 01/04/21 1536  Filed Weights   12/29/20 0051 12/29/20 0500 12/30/20 0507  Weight: 86 kg 86 kg 86 kg    Examination:  General.     In no acute distress. Pulmonary.  Lungs clear bilaterally, normal respiratory effort. CV.  Regular rate and rhythm, no JVD, rub or murmur. Abdomen.  Soft, nontender, nondistended, BS positive. CNS.  Alert but refusing to answer any orientation questions Extremities.  No edema, no cyanosis, pulses intact and symmetrical. Psychiatry.  Judgment and insight appears impaired  DVT prophylaxis: Patient refusing all care, now comfort care Code Status: DNR Family Communication: . Disposition Plan:  Status is: Inpatient  Remains inpatient appropriate because: Severity of illness   Level of care: Med-Surg  All the records are reviewed and case discussed with Care Management/Social Worker. Management plans discussed with the patient, nursing and they are in agreement.  Consultants:  Palliative care Cardiology  Procedures:  Antimicrobials:   Data Reviewed: I have personally reviewed following labs and imaging studies  CBC: Recent Labs  Lab 12/28/20 1735 12/29/20 0645  WBC 9.1 9.2  HGB 9.3* 8.8*  HCT 30.4* 28.3*  MCV 90.7 89.3  PLT 290 354    Basic Metabolic Panel: Recent Labs  Lab 12/28/20 1735  NA 139  K 3.7  CL 106  CO2 26  GLUCOSE 140*  BUN 35*  CREATININE 2.05*  CALCIUM 9.1    GFR: Estimated Creatinine Clearance: 22.7 mL/min (A) (by C-G formula based on SCr of 2.05 mg/dL (H)). Liver Function Tests: No  results for input(s): AST, ALT, ALKPHOS, BILITOT, PROT, ALBUMIN in the last 168 hours.  No results for input(s): LIPASE, AMYLASE in the last 168 hours. No results for input(s): AMMONIA in the last 168 hours. Coagulation Profile: Recent Labs  Lab 12/28/20 1736  INR 1.1    Cardiac Enzymes: No results for input(s): CKTOTAL, CKMB, CKMBINDEX, TROPONINI in the last 168 hours. BNP (last 3 results) No results for input(s): PROBNP in the last 8760 hours. HbA1C: No results for input(s): HGBA1C in the last 72 hours.  CBG: Recent Labs  Lab 12/29/20 0922 12/30/20 0752  GLUCAP 100* 106*    Lipid Profile: No results for input(s): CHOL, HDL, LDLCALC, TRIG, CHOLHDL, LDLDIRECT in the last 72 hours.  Thyroid Function Tests: No results for input(s): TSH, T4TOTAL, FREET4, T3FREE, THYROIDAB in the last 72 hours.  Anemia Panel: No results for input(s): VITAMINB12, FOLATE, FERRITIN, TIBC, IRON, RETICCTPCT in the last 72 hours. Sepsis Labs: Recent Labs  Lab 12/28/20 1736  LATICACIDVEN 1.3  Recent Results (from the past 240 hour(s))  Resp Panel by RT-PCR (Flu A&B, Covid) Nasopharyngeal Swab     Status: Abnormal   Collection Time: 12/27/20  5:29 PM   Specimen: Nasopharyngeal Swab; Nasopharyngeal(NP) swabs in vial transport medium  Result Value Ref Range Status   SARS Coronavirus 2 by RT PCR POSITIVE (A) NEGATIVE Final    Comment: (NOTE) SARS-CoV-2 target nucleic acids are DETECTED.  The SARS-CoV-2 RNA is generally detectable in upper respiratory specimens during the acute phase of infection. Positive results are indicative of the presence of the identified virus, but do not rule out bacterial infection or co-infection with other pathogens not detected by the test. Clinical correlation with patient history and other diagnostic information is necessary to determine patient infection status. The expected result is Negative.  Fact Sheet for  Patients: EntrepreneurPulse.com.au  Fact Sheet for Healthcare Providers: IncredibleEmployment.be  This test is not yet approved or cleared by the [place] FDA and  has been authorized for detection and/or diagnosis of SARS-CoV-2 by FDA under an Emergency Use Authorization (EUA).  This EUA will remain in effect (meaning this test can be used) for the duration of  the COVID-19 declaration under Section 564(b)(1) of the A ct, 21 U.S.C. section 360bbb-3(b)(1), unless the authorization is terminated or revoked sooner.     Influenza A by PCR NEGATIVE NEGATIVE Final   Influenza B by PCR NEGATIVE NEGATIVE Final    Comment: (NOTE) The Xpert Xpress SARS-CoV-2/FLU/RSV plus assay is intended as an aid in the diagnosis of influenza from Nasopharyngeal swab specimens and should not be used as a sole basis for treatment. Nasal washings and aspirates are unacceptable for Xpert Xpress SARS-CoV-2/FLU/RSV testing.  Fact Sheet for Patients: EntrepreneurPulse.com.au  Fact Sheet for Healthcare Providers: IncredibleEmployment.be  This test is not yet approved or cleared by the [place] FDA and has been authorized for detection and/or diagnosis of SARS-CoV-2 by FDA under an Emergency Use Authorization (EUA). This EUA will remain in effect (meaning this test can be used) for the duration of the COVID-19 declaration under Section 564(b)(1) of the Act, 21 U.S.C. section 360bbb-3(b)(1), unless the authorization is terminated or revoked.  Performed at [facility], [address] [place], [place] [address]   Urine Culture     Status: Abnormal   Collection Time: 12/28/20  6:37 AM   Specimen: Urine, Clean Catch  Result Value Ref Range Status   Specimen Description   Final    URINE, CLEAN CATCH Performed at [facility], [address] [place], [place] [address]    Special Requests   Final     NONE Performed at [facility], [address] [place], [place] [address]    Culture MULTIPLE SPECIES PRESENT, SUGGEST RECOLLECTION (A)  Final   Report Status 12/29/2020 FINAL  Final  Culture, blood (routine x 2)     Status: Abnormal   Collection Time: 12/28/20  5:35 PM   Specimen: BLOOD  Result Value Ref Range Status   Specimen Description   Final    BLOOD RIGHT ANTECUBITAL Performed at [facility], [address], [place], [place] [address]    Special Requests   Final    BOTTLES DRAWN AEROBIC AND ANAEROBIC Blood Culture adequate volume Performed at [address] [place], [place] [address]    Culture  Setup Time   Final    GRAM POSITIVE COCCI IN BOTH AEROBIC AND ANAEROBIC BOTTLES Organism ID to follow CRITICAL RESULT  CALLED TO, READ BACK BY AND VERIFIED WITH: BRANDON BEERS 12/29/20 1600 MU Performed at [facility], [address] [place], [place] [address]    Culture (A)  Final    STAPHYLOCOCCUS EPIDERMIDIS THE SIGNIFICANCE OF ISOLATING THIS ORGANISM FROM A SINGLE SET OF BLOOD CULTURES WHEN MULTIPLE SETS ARE DRAWN IS UNCERTAIN. PLEASE NOTIFY THE MICROBIOLOGY DEPARTMENT WITHIN ONE WEEK IF SPECIATION AND SENSITIVITIES ARE REQUIRED. Performed at [facility], [place] [address] [place], [place] [address]    Report Status 12/31/2020 FINAL  Final  Blood Culture ID Panel (Reflexed)     Status: Abnormal   Collection Time: 12/28/20  5:35 PM  Result Value Ref Range Status   Enterococcus faecalis NOT DETECTED NOT DETECTED Final   Enterococcus Faecium NOT DETECTED NOT DETECTED Final   Listeria monocytogenes NOT DETECTED NOT DETECTED Final   Staphylococcus species DETECTED (A) NOT DETECTED Final    Comment: CRITICAL RESULT CALLED TO, READ BACK BY AND VERIFIED WITH: BRANDON BEERS 12/29/20 1600 MU    Staphylococcus aureus (BCID) NOT DETECTED NOT DETECTED Final   Staphylococcus epidermidis DETECTED (A) NOT DETECTED Final     Comment: Methicillin (oxacillin) resistant coagulase negative staphylococcus. Possible blood culture contaminant (unless isolated from more than one blood culture draw or clinical case suggests pathogenicity). No antibiotic treatment is indicated for blood  culture contaminants. CRITICAL RESULT CALLED TO, READ BACK BY AND VERIFIED WITH: BRANDON BEERS 12/29/20 1600 MU    Staphylococcus lugdunensis NOT DETECTED NOT DETECTED Final   Streptococcus species NOT DETECTED NOT DETECTED Final   Streptococcus agalactiae NOT DETECTED NOT DETECTED Final   Streptococcus pneumoniae NOT DETECTED NOT DETECTED Final   Streptococcus pyogenes NOT DETECTED NOT DETECTED Final   A.calcoaceticus-baumannii NOT DETECTED NOT DETECTED Final   Bacteroides fragilis NOT DETECTED NOT DETECTED Final   Enterobacterales NOT DETECTED NOT DETECTED Final   Enterobacter cloacae complex NOT DETECTED NOT DETECTED Final   Escherichia coli NOT DETECTED NOT DETECTED Final   Klebsiella aerogenes NOT DETECTED NOT DETECTED Final   Klebsiella oxytoca NOT DETECTED NOT DETECTED Final   Klebsiella pneumoniae NOT DETECTED NOT DETECTED Final   Proteus species NOT DETECTED NOT DETECTED Final   Salmonella species NOT DETECTED NOT DETECTED Final   Serratia marcescens NOT DETECTED NOT DETECTED Final   Haemophilus influenzae NOT DETECTED NOT DETECTED Final   Neisseria meningitidis NOT DETECTED NOT DETECTED Final   Pseudomonas aeruginosa NOT DETECTED NOT DETECTED Final   Stenotrophomonas maltophilia NOT DETECTED NOT DETECTED Final   Candida albicans NOT DETECTED NOT DETECTED Final   Candida auris NOT DETECTED NOT DETECTED Final   Candida glabrata NOT DETECTED NOT DETECTED Final   Candida krusei NOT DETECTED NOT DETECTED Final   Candida parapsilosis NOT DETECTED NOT DETECTED Final   Candida tropicalis NOT DETECTED NOT DETECTED Final   Cryptococcus neoformans/gattii NOT DETECTED NOT DETECTED Final   Methicillin resistance mecA/C DETECTED (A)  NOT DETECTED Final    Comment: CRITICAL RESULT CALLED TO, READ BACK BY AND VERIFIED WITH: BRANDON BEERS 12/29/20 1600 MU Performed at [facility], [address] [place],  [address]   Culture, blood (Routine X 2) w Reflex to ID Panel     Status: None   Collection Time: 12/29/20 12:42 AM   Specimen: BLOOD  Result Value Ref Range Status   Specimen Description BLOOD LEFT ASSIST CONTROL  Final   Special Requests   Final    IN PEDIATRIC BOTTLE Blood Culture results may not be optimal due to  an excessive volume of blood received in culture bottles   Culture   Final    NO GROWTH 6 DAYS Performed at [facility], [address] [place],  [address]    Report Status 01/04/2021 FINAL  Final      Radiology Studies: No results found.  Scheduled Meds:   Continuous Infusions:     LOS: 7 days   Time spent: 28 minutes. More than 50% of the time was spent in counseling/coordination of care  Lorella Nimrod, MD [facility]  If 7PM-7AM, please contact night-coverage [email or web address]  01/04/2021, 3:36 PM   This record has been created using Systems analyst. Errors have been sought and corrected,but may not always be located. Such creation errors do not reflect on the standard of care.

## 2021-01-04 NOTE — Telephone Encounter (Signed)
FYI...  Ms. Leron Croak will be dropping off the paperwork today or tomorrow morning.    Thanks,   -Mickel Baas

## 2021-01-04 NOTE — Progress Notes (Signed)
Nutrition Brief Note  Chart reviewed. Pt now transitioning to comfort care.  No further nutrition interventions planned at this time.  Please re-consult as needed.   Padme Arriaga W, RD, LDN, CDCES Registered Dietitian II Certified Diabetes Care and Education Specialist Please refer to AMION for RD and/or RD on-call/weekend/after hours pager   

## 2021-01-04 NOTE — Plan of Care (Signed)

## 2021-01-04 NOTE — Progress Notes (Signed)
Emergency contact and daughter, Hitomi Slape, notified regarding patient's refusal of pain medication. Daughter stated she was on the way to hospital.

## 2021-01-04 NOTE — Progress Notes (Signed)
Patient had previously reported she had a headache to nursing staff during episode of patient care. Attempted to give patient a Tylenol suppository as needed. Patient refused suppository and hit nurse's hand stating "no, cover me up". Nurse attempted to educate patient that suppository was being given to assist with pain. Patient continued to refuse suppository. Attending physician was made aware as nurse was exiting room.

## 2021-01-05 ENCOUNTER — Telehealth: Payer: Self-pay

## 2021-01-05 NOTE — Telephone Encounter (Signed)
Patient's daughter came by with FMLA paper work to be completed ASAP please.   She said her mother is going to hospice home tomorrow or going to receive hospice car at her own home.  Her FMLA papers need to be extended due to this. Please call Olivia Mackie when papers are complete.

## 2021-01-05 NOTE — Progress Notes (Signed)
[facility]) Hospital Liaison Note   MSW spoke with daughter/Carolyn on this date and provided education of alternative plans if patient is not eligible for [facility] following the conclusion of patient's quarantine.   Family is familiar with [facility] services and are receptive to patient returning home with home care support. No additional questions/concerns reported during call.   Please call with any questions/concerns.    Thank you for the opportunity to participate in this patient's care.   Daphene Calamity, MSW [facility] Liaison  [phone]

## 2021-01-05 NOTE — Progress Notes (Signed)
Progress note:  I received a phone call from patient's daughter regarding hospice placement.  AFter reviewing the patient's chart and epic notes, I spoke with daughter [facility]) on the phone.  She is concerned that if the patient is not accepted to the hospice home then she needs to prepare the patient's home for hospice care in her home.  I shared that hospice eligibility is dependent on hospice MDs evaluation.  Evaluation will not take place until January 5, when patient clears 10-day COVID quarantine.  If patient is not excepted to hospice home, educated daughter that hospice in the home will help provide DME.  Nonemergent transport will move patient home.  Education provided that hospice in the home is a nurse approximately once a week as well as a nurse aide approximately 1-2 times a week.  Daughter endorses patient's home is ready for patient should she not be eligible for hospice inpatient unit.  Therapeutic silence and active listening given to daughter to share thoughts and emotions regarding mother's current diagnosis.  Daughter is very happy that patient has been transferred from 2nd to 1st floor of [facility].  Palliative medicine team will continue to follow patient throughout her hospitalization.  All questions and concerns from family were addressed.  15 minutes  Willadene Mounsey L. Ilsa Iha, FNP-BC Palliative Medicine Team Team Phone # [phone]  Greater than 50% of this time was spent counseling and coordinating care related to the above assessment and plan.

## 2021-01-05 NOTE — Progress Notes (Signed)
PROGRESS NOTE    [name]  LMB:[id number] DOB: 30-Jan-1945 DOA: 12/27/2020 PCP: Birdie Sons, MD   Brief Narrative: Taken from H&P. [name] is a 76 y.o. female with medical history significant of CKD-IV, hypertension, hyperlipidemia, diet-controlled diabetes, asthma, depression, pulmonary hypertension, CAD, aortic stenosis, dCHF, multiple myeloma, dementia, depression, who presents with shortness of breath. Patient with recent admission earlier this month (12/08/2020-12/15/2020) with recurrent UTIs and associated urinary frequency, anorexia, and agitation superimposed on baseline dementia.  She was discharged to SNF for rehab.  At baseline she is wheelchair dependent but able to transfer herself from bed to wheelchair, rehab purpuras was to go back to her baseline.  Daughter will not be able to help her if she cannot transfer herself.  Per daughter since discharge patient is experiencing worsening fatigue and refusing most of the care and p.o. intake.  For about 5 to 6 days she started refusing all of her medications.  She was tested positive for COVID-19 on 12/27/2020 first at her facility, later confirmed by PCR in ED.  She was also found to have new onset A. fib with RVR on arrival to ED. cardiology was consulted and she was converted to sinus rhythm this morning.  TSH levels pending as patient is refusing labs. CHA2DS2-VASc score of 6, she is currently on heparin infusion and will need NOAC before discharge but she should be able to take it regularly in order to get the benefit.  She was also found to have elevated troponin at 70.  Refusing further labs so no trend yet.  No chest pain.  Admitted for failure to thrive and new onset A. fib with RVR. Palliative care was also consulted.  12/28: Patient continued to refuse food and medicine.  Palliative care talked with the family and they are thinking about considering home hospice versus in patient hospice.  No final decision made  yet. Remained in sinus rhythm-Heparin infusion to be replaced with NOAC if she takes any medicine. Psych also evaluated her and they are not recommending any new changes to her current medication, currently refusing all the meds.  12/29: Family decided to move with comfort measures only.  Patient continued to refuse all p.o. intake including all the meds and food.  Family wants her to go to hospice home and for that she has to complete 10 days of quarantine, tested positive on 12/27/2020 and remained asymptomatic since then. Patient has to wait till 01/06/2021 to complete 10 days of quarantine before moving to hospice home.  01/05/2021: Patient was seen at bedside.  She is resting peacefully.  No new complaints.   Assessment & Plan:   Principal Problem:   Atrial fibrillation with RVR (HCC) Active Problems:   Type 2 diabetes mellitus with stage 4 chronic kidney disease (HCC)   Essential hypertension   Multiple myeloma not having achieved remission (HCC)   Chronic HFrEF (heart failure with reduced ejection fraction) (HCC)   CKD (chronic kidney disease), stage IV (HCC)   Suspected COVID-19 virus infection   Elevated troponin   Failure to thrive in adult   CAD (coronary artery disease)   Asthma   Dementia ([place])   Depression   Comfort measures only status  New onset A. fib with RVR.   COVID-19 infection.   Elevated troponin/history of CAD.  Essential hypertension.   Chronic HFrEF with normalization of EF.   CKD stage IV.   Multiple myeloma not having achieved remission ([place]) -Follow-up with oncology  Failure  to thrive.  Started after prior discharge to rehab.  History of losing her 49 years of spouse 2 months ago.  Depression might be playing a role.  We obtained psych consult and they do not think that she need any changes to her current medication.  Patient is refusing to take all the meds. Family decided to transition her to comfort care only. Waiting to complete 10 days of  quarantine before proceeding for hospice home.  Will complete on 01/06/2021.  Objective: Vitals:   01/01/21 1015 01/01/21 1155 01/04/21 0459 01/05/21 0522  BP: (!) 101/43 (!) 101/49  (!) 145/89  Pulse:    (!) 110  Resp:   20 16  SpO2: 93% 91%  99%  Weight:      Height:        Intake/Output Summary (Last 24 hours) at 01/05/2021 1440 Last data filed at 01/05/2021 1025 Gross per 24 hour  Intake 60 ml  Output --  Net 60 ml   Filed Weights   12/29/20 0051 12/29/20 0500 12/30/20 0507  Weight: 86 kg 86 kg 86 kg    Examination:  General.     In no acute distress. Pulmonary.  Lungs clear bilaterally, normal respiratory effort. CV.  Regular rate and rhythm, no JVD, rub or murmur. Abdomen.  Soft, nontender, nondistended, BS positive. CNS.  Alert but refusing to answer any orientation questions Extremities.  No edema, no cyanosis, pulses intact and symmetrical. Psychiatry.  Judgment and insight appears impaired  DVT prophylaxis: Patient refusing all care, now comfort care Code Status: DNR Family Communication: . Disposition Plan:  Status is: Inpatient  Remains inpatient appropriate because: Severity of illness   Level of care: Med-Surg  All the records are reviewed and case discussed with Care Management/Social Worker. Management plans discussed with the patient, nursing and they are in agreement.  Consultants:  Palliative care Cardiology  Procedures:  Antimicrobials:   Data Reviewed: I have personally reviewed following labs and imaging studies  CBC: No results for input(s): WBC, NEUTROABS, HGB, HCT, MCV, PLT in the last 168 hours. Basic Metabolic Panel: No results for input(s): NA, K, CL, CO2, GLUCOSE, BUN, CREATININE, CALCIUM, MG, PHOS in the last 168 hours. GFR: Estimated Creatinine Clearance: 22.7 mL/min (A) (by C-G formula based on SCr of 2.05 mg/dL (H)). Liver Function Tests: No results for input(s): AST, ALT, ALKPHOS, BILITOT, PROT, ALBUMIN in the last 168  hours.  No results for input(s): LIPASE, AMYLASE in the last 168 hours. No results for input(s): AMMONIA in the last 168 hours. Coagulation Profile: No results for input(s): INR, PROTIME in the last 168 hours. Cardiac Enzymes: No results for input(s): CKTOTAL, CKMB, CKMBINDEX, TROPONINI in the last 168 hours. BNP (last 3 results) No results for input(s): PROBNP in the last 8760 hours. HbA1C: No results for input(s): HGBA1C in the last 72 hours.  CBG: Recent Labs  Lab 12/30/20 0752  GLUCAP 106*   Lipid Profile: No results for input(s): CHOL, HDL, LDLCALC, TRIG, CHOLHDL, LDLDIRECT in the last 72 hours.  Thyroid Function Tests: No results for input(s): TSH, T4TOTAL, FREET4, T3FREE, THYROIDAB in the last 72 hours.  Anemia Panel: No results for input(s): VITAMINB12, FOLATE, FERRITIN, TIBC, IRON, RETICCTPCT in the last 72 hours. Sepsis Labs: No results for input(s): PROCALCITON, LATICACIDVEN in the last 168 hours.  Recent Results (from the past 240 hour(s))  Resp Panel by RT-PCR (Flu A&B, Covid) Nasopharyngeal Swab     Status: Abnormal   Collection Time: 12/27/20  5:29 PM  Specimen: Nasopharyngeal Swab; Nasopharyngeal(NP) swabs in vial transport medium  Result Value Ref Range Status   SARS Coronavirus 2 by RT PCR POSITIVE (A) NEGATIVE Final    Comment: (NOTE) SARS-CoV-2 target nucleic acids are DETECTED.  The SARS-CoV-2 RNA is generally detectable in upper respiratory specimens during the acute phase of infection. Positive results are indicative of the presence of the identified virus, but do not rule out bacterial infection or co-infection with other pathogens not detected by the test. Clinical correlation with patient history and other diagnostic information is necessary to determine patient infection status. The expected result is Negative.  Fact Sheet for Patients: EntrepreneurPulse.com.au  Fact Sheet for Healthcare  Providers: IncredibleEmployment.be  This test is not yet approved or cleared by the [place] FDA and  has been authorized for detection and/or diagnosis of SARS-CoV-2 by FDA under an Emergency Use Authorization (EUA).  This EUA will remain in effect (meaning this test can be used) for the duration of  the COVID-19 declaration under Section 564(b)(1) of the A ct, 21 U.S.C. section 360bbb-3(b)(1), unless the authorization is terminated or revoked sooner.     Influenza A by PCR NEGATIVE NEGATIVE Final   Influenza B by PCR NEGATIVE NEGATIVE Final    Comment: (NOTE) The Xpert Xpress SARS-CoV-2/FLU/RSV plus assay is intended as an aid in the diagnosis of influenza from Nasopharyngeal swab specimens and should not be used as a sole basis for treatment. Nasal washings and aspirates are unacceptable for Xpert Xpress SARS-CoV-2/FLU/RSV testing.  Fact Sheet for Patients: EntrepreneurPulse.com.au  Fact Sheet for Healthcare Providers: IncredibleEmployment.be  This test is not yet approved or cleared by the [place] FDA and has been authorized for detection and/or diagnosis of SARS-CoV-2 by FDA under an Emergency Use Authorization (EUA). This EUA will remain in effect (meaning this test can be used) for the duration of the COVID-19 declaration under Section 564(b)(1) of the Act, 21 U.S.C. section 360bbb-3(b)(1), unless the authorization is terminated or revoked.  Performed at [facility], [address], [place], [place] [address]   Urine Culture     Status: Abnormal   Collection Time: 12/28/20  6:37 AM   Specimen: Urine, Clean Catch  Result Value Ref Range Status   Specimen Description   Final    URINE, CLEAN CATCH Performed at [facility], [address] [place], [place] [address]    Special Requests   Final    NONE Performed at [facility], [address] [place], [place] [address]     Culture MULTIPLE SPECIES PRESENT, SUGGEST RECOLLECTION (A)  Final   Report Status 12/29/2020 FINAL  Final  Culture, blood (routine x 2)     Status: Abnormal   Collection Time: 12/28/20  5:35 PM   Specimen: BLOOD  Result Value Ref Range Status   Specimen Description   Final    BLOOD RIGHT ANTECUBITAL Performed at [facility], [address] [place], [place] [address]    Special Requests   Final    BOTTLES DRAWN AEROBIC AND ANAEROBIC Blood Culture adequate volume Performed at [facility], [address], [place], [place] [address]    Culture  Setup Time   Final    GRAM POSITIVE COCCI IN BOTH AEROBIC AND ANAEROBIC BOTTLES Organism ID to follow CRITICAL RESULT CALLED TO, READ BACK BY AND VERIFIED WITH: BRANDON BEERS 12/29/20 1600 MU Performed at [facility], [address] [place], [place] [address]    Culture (A)  Final  STAPHYLOCOCCUS EPIDERMIDIS THE SIGNIFICANCE OF ISOLATING THIS ORGANISM FROM A SINGLE SET OF BLOOD CULTURES WHEN MULTIPLE SETS ARE DRAWN IS UNCERTAIN. PLEASE NOTIFY THE MICROBIOLOGY DEPARTMENT WITHIN ONE WEEK IF SPECIATION AND SENSITIVITIES ARE REQUIRED. Performed at [facility], [place] [address] [place], [place] [address]    Report Status 12/31/2020 FINAL  Final  Blood Culture ID Panel (Reflexed)     Status: Abnormal   Collection Time: 12/28/20  5:35 PM  Result Value Ref Range Status   Enterococcus faecalis NOT DETECTED NOT DETECTED Final   Enterococcus Faecium NOT DETECTED NOT DETECTED Final   Listeria monocytogenes NOT DETECTED NOT DETECTED Final   Staphylococcus species DETECTED (A) NOT DETECTED Final    Comment: CRITICAL RESULT CALLED TO, READ BACK BY AND VERIFIED WITH: BRANDON BEERS 12/29/20 1600 MU    Staphylococcus aureus (BCID) NOT DETECTED NOT DETECTED Final   Staphylococcus epidermidis DETECTED (A) NOT DETECTED Final    Comment: Methicillin (oxacillin) resistant coagulase negative staphylococcus. Possible  blood culture contaminant (unless isolated from more than one blood culture draw or clinical case suggests pathogenicity). No antibiotic treatment is indicated for blood  culture contaminants. CRITICAL RESULT CALLED TO, READ BACK BY AND VERIFIED WITH: BRANDON BEERS 12/29/20 1600 MU    Staphylococcus lugdunensis NOT DETECTED NOT DETECTED Final   Streptococcus species NOT DETECTED NOT DETECTED Final   Streptococcus agalactiae NOT DETECTED NOT DETECTED Final   Streptococcus pneumoniae NOT DETECTED NOT DETECTED Final   Streptococcus pyogenes NOT DETECTED NOT DETECTED Final   A.calcoaceticus-baumannii NOT DETECTED NOT DETECTED Final   Bacteroides fragilis NOT DETECTED NOT DETECTED Final   Enterobacterales NOT DETECTED NOT DETECTED Final   Enterobacter cloacae complex NOT DETECTED NOT DETECTED Final   Escherichia coli NOT DETECTED NOT DETECTED Final   Klebsiella aerogenes NOT DETECTED NOT DETECTED Final   Klebsiella oxytoca NOT DETECTED NOT DETECTED Final   Klebsiella pneumoniae NOT DETECTED NOT DETECTED Final   Proteus species NOT DETECTED NOT DETECTED Final   Salmonella species NOT DETECTED NOT DETECTED Final   Serratia marcescens NOT DETECTED NOT DETECTED Final   Haemophilus influenzae NOT DETECTED NOT DETECTED Final   Neisseria meningitidis NOT DETECTED NOT DETECTED Final   Pseudomonas aeruginosa NOT DETECTED NOT DETECTED Final   Stenotrophomonas maltophilia NOT DETECTED NOT DETECTED Final   Candida albicans NOT DETECTED NOT DETECTED Final   Candida auris NOT DETECTED NOT DETECTED Final   Candida glabrata NOT DETECTED NOT DETECTED Final   Candida krusei NOT DETECTED NOT DETECTED Final   Candida parapsilosis NOT DETECTED NOT DETECTED Final   Candida tropicalis NOT DETECTED NOT DETECTED Final   Cryptococcus neoformans/gattii NOT DETECTED NOT DETECTED Final   Methicillin resistance mecA/C DETECTED (A) NOT DETECTED Final    Comment: CRITICAL RESULT CALLED TO, READ BACK BY AND VERIFIED  WITH: BRANDON BEERS 12/29/20 1600 MU Performed at [facility], [address] [place], [place] [address]   Culture, blood (Routine X 2) w Reflex to ID Panel     Status: None   Collection Time: 12/29/20 12:42 AM   Specimen: BLOOD  Result Value Ref Range Status   Specimen Description BLOOD LEFT ASSIST CONTROL  Final   Special Requests   Final    IN PEDIATRIC BOTTLE Blood Culture results may not be optimal due to an excessive volume of blood received in culture bottles   Culture   Final    NO GROWTH 6 DAYS Performed at [facility], [address] [place], [place] [address]  Report Status 01/04/2021 FINAL  Final      Radiology Studies: No results found.  Scheduled Meds:   Continuous Infusions:     LOS: 8 days   Time spent: 28 minutes. More than 50% of the time was spent in counseling/coordination of care  Kayleen Memos, MD [facility]  If 7PM-7AM, please contact night-coverage [email or web address]  01/05/2021, 2:40 PM   This record has been created using Systems analyst. Errors have been sought and corrected,but may not always be located. Such creation errors do not reflect on the standard of care.

## 2021-01-06 NOTE — Progress Notes (Signed)
[facility]) Hospital Liaison Note   Patient chart and information reviewed by [facility] physician. Hospice Home eligibility confirmed.    Unfortunately, Hospice Home is not able to offer a room today. Family and Dayton Scrape, LCSW [facility] Manager aware hospital liaison will follow up tomorrow or sooner if a room becomes available.    Please do not hesitate to call with any hospice related questions.    Thank you for the opportunity to participate in this patient's care.   Bobbie "Loren Racer, RN, BSN [facility] Liaison [phone]

## 2021-01-06 NOTE — Progress Notes (Signed)
After reviewing the patient's chart, it is clear the patient is set to be admitted to hospice home when bed is available.  I assessed the patient at bedside and she is in no apparent distress.  No family at bedside.  As per chart, hospice liaison will contact medical team and family when bed is available for patient and hospice home.  Palliative medicine team will continue to follow patient on her hospitalization.  Mustang Ilsa Iha, FNP-BC Palliative Medicine Team Team Phone # [phone]  NO CHARGE

## 2021-01-06 NOTE — Progress Notes (Signed)
PROGRESS NOTE    [name]  YNW:[id number] DOB: 1945-06-21 DOA: 12/27/2020 PCP: Birdie Sons, MD   Brief Narrative: Taken from H&P. [name] is a 76 y.o. female with medical history significant of CKD-IV, hypertension, hyperlipidemia, diet-controlled diabetes, asthma, depression, pulmonary hypertension, CAD, aortic stenosis, dCHF, multiple myeloma, dementia, depression, who presents with shortness of breath. Patient with recent admission earlier this month (12/08/2020-12/15/2020) with recurrent UTIs and associated urinary frequency, anorexia, and agitation superimposed on baseline dementia.  She was discharged to SNF for rehab.  At baseline she is wheelchair dependent but able to transfer herself from bed to wheelchair, rehab purpuras was to go back to her baseline.  Daughter will not be able to help her if she cannot transfer herself.  Per daughter since discharge patient is experiencing worsening fatigue and refusing most of the care and p.o. intake.  For about 5 to 6 days she started refusing all of her medications.  She was tested positive for COVID-19 on 12/27/2020 first at her facility, later confirmed by PCR in ED.  She was also found to have new onset A. fib with RVR on arrival to ED. cardiology was consulted and she was converted to sinus rhythm this morning.  TSH levels pending as patient is refusing labs. CHA2DS2-VASc score of 6, she is currently on heparin infusion and will need NOAC before discharge but she should be able to take it regularly in order to get the benefit.  She was also found to have elevated troponin at 70.  Refusing further labs so no trend yet.  No chest pain.  Admitted for failure to thrive and new onset A. fib with RVR. Palliative care was also consulted.  12/28: Patient continued to refuse food and medicine.  Palliative care talked with the family and they are thinking about considering home hospice versus in patient hospice.  No final decision made  yet. Remained in sinus rhythm-Heparin infusion to be replaced with NOAC if she takes any medicine. Psych also evaluated her and they are not recommending any new changes to her current medication, currently refusing all the meds.  12/29: Family decided to move with comfort measures only.  Patient continued to refuse all p.o. intake including all the meds and food.  Family wants her to go to hospice home and for that she has to complete 10 days of quarantine, tested positive on 12/27/2020 and remained asymptomatic since then. Patient has to wait till 01/06/2021 to complete 10 days of quarantine before moving to hospice home.  01/06/2021: Patient was seen and examined at bedside.  She is somnolent but easily arousable to voices.  No new complaints.  Awaiting placement to residential hospice.  Assessment & Plan:   Principal Problem:   Atrial fibrillation with RVR (HCC) Active Problems:   Type 2 diabetes mellitus with stage 4 chronic kidney disease (HCC)   Essential hypertension   Multiple myeloma not having achieved remission (HCC)   Chronic HFrEF (heart failure with reduced ejection fraction) (HCC)   CKD (chronic kidney disease), stage IV (HCC)   Suspected COVID-19 virus infection   Elevated troponin   Failure to thrive in adult   CAD (coronary artery disease)   Asthma   Dementia ([name])   Depression   Comfort measures only status  New onset A. fib with RVR.   COVID-19 infection.   Elevated troponin/history of CAD.  Essential hypertension.   Chronic HFrEF with normalization of EF.   CKD stage IV.   Multiple  myeloma not having achieved remission ([name]) -Follow-up with oncology  Failure to thrive.  Started after prior discharge to rehab.  History of losing her 29 years of spouse 2 months ago.  Depression might be playing a role.  We obtained psych consult and they do not think that she need any changes to her current medication.  Patient is refusing to take all the meds. Family decided to  transition her to comfort care only. Waiting to complete 10 days of quarantine before proceeding for hospice home.  Will complete on 01/06/2021.  Objective: Vitals:   01/01/21 1015 01/01/21 1155 01/04/21 0459 01/05/21 0522  BP: (!) 101/43 (!) 101/49  (!) 145/89  Pulse:    (!) 110  Resp:   20 16  SpO2: 93% 91%  99%  Weight:      Height:       No intake or output data in the 24 hours ending 01/06/21 1123  Filed Weights   12/29/20 0051 12/29/20 0500 12/30/20 0507  Weight: 86 kg 86 kg 86 kg    Examination: No significant change from prior exam.  General.     In no acute distress. Pulmonary.  Lungs clear bilaterally, normal respiratory effort. CV.  Regular rate and rhythm, no JVD, rub or murmur. Abdomen.  Soft, nontender, nondistended, BS positive. CNS.  Alert but refusing to answer any orientation questions Extremities.  No edema, no cyanosis, pulses intact and symmetrical. Psychiatry.  Judgment and insight appears impaired  DVT prophylaxis: Patient refusing all care, now comfort care Code Status: DNR Family Communication: . Disposition Plan:  Status is: Inpatient  Remains inpatient appropriate because: Severity of illness   Level of care: Med-Surg  All the records are reviewed and case discussed with Care Management/Social Worker. Management plans discussed with the patient, nursing and they are in agreement.  Consultants:  Palliative care Cardiology  Procedures:  Antimicrobials:   Data Reviewed: I have personally reviewed following labs and imaging studies  CBC: No results for input(s): WBC, NEUTROABS, HGB, HCT, MCV, PLT in the last 168 hours. Basic Metabolic Panel: No results for input(s): NA, K, CL, CO2, GLUCOSE, BUN, CREATININE, CALCIUM, MG, PHOS in the last 168 hours. GFR: Estimated Creatinine Clearance: 22.7 mL/min (A) (by C-G formula based on SCr of 2.05 mg/dL (H)). Liver Function Tests: No results for input(s): AST, ALT, ALKPHOS, BILITOT, PROT, ALBUMIN  in the last 168 hours.  No results for input(s): LIPASE, AMYLASE in the last 168 hours. No results for input(s): AMMONIA in the last 168 hours. Coagulation Profile: No results for input(s): INR, PROTIME in the last 168 hours. Cardiac Enzymes: No results for input(s): CKTOTAL, CKMB, CKMBINDEX, TROPONINI in the last 168 hours. BNP (last 3 results) No results for input(s): PROBNP in the last 8760 hours. HbA1C: No results for input(s): HGBA1C in the last 72 hours.  CBG: No results for input(s): GLUCAP in the last 168 hours.  Lipid Profile: No results for input(s): CHOL, HDL, LDLCALC, TRIG, CHOLHDL, LDLDIRECT in the last 72 hours.  Thyroid Function Tests: No results for input(s): TSH, T4TOTAL, FREET4, T3FREE, THYROIDAB in the last 72 hours.  Anemia Panel: No results for input(s): VITAMINB12, FOLATE, FERRITIN, TIBC, IRON, RETICCTPCT in the last 72 hours. Sepsis Labs: No results for input(s): PROCALCITON, LATICACIDVEN in the last 168 hours.  Recent Results (from the past 240 hour(s))  Resp Panel by RT-PCR (Flu A&B, Covid) Nasopharyngeal Swab     Status: Abnormal   Collection Time: 12/27/20  5:29 PM  Specimen: Nasopharyngeal Swab; Nasopharyngeal(NP) swabs in vial transport medium  Result Value Ref Range Status   SARS Coronavirus 2 by RT PCR POSITIVE (A) NEGATIVE Final    Comment: (NOTE) SARS-CoV-2 target nucleic acids are DETECTED.  The SARS-CoV-2 RNA is generally detectable in upper respiratory specimens during the acute phase of infection. Positive results are indicative of the presence of the identified virus, but do not rule out bacterial infection or co-infection with other pathogens not detected by the test. Clinical correlation with patient history and other diagnostic information is necessary to determine patient infection status. The expected result is Negative.  Fact Sheet for Patients: EntrepreneurPulse.com.au  Fact Sheet for Healthcare  Providers: IncredibleEmployment.be  This test is not yet approved or cleared by the [place] FDA and  has been authorized for detection and/or diagnosis of SARS-CoV-2 by FDA under an Emergency Use Authorization (EUA).  This EUA will remain in effect (meaning this test can be used) for the duration of  the COVID-19 declaration under Section 564(b)(1) of the A ct, 21 U.S.C. section 360bbb-3(b)(1), unless the authorization is terminated or revoked sooner.     Influenza A by PCR NEGATIVE NEGATIVE Final   Influenza B by PCR NEGATIVE NEGATIVE Final    Comment: (NOTE) The Xpert Xpress SARS-CoV-2/FLU/RSV plus assay is intended as an aid in the diagnosis of influenza from Nasopharyngeal swab specimens and should not be used as a sole basis for treatment. Nasal washings and aspirates are unacceptable for Xpert Xpress SARS-CoV-2/FLU/RSV testing.  Fact Sheet for Patients: EntrepreneurPulse.com.au  Fact Sheet for Healthcare Providers: IncredibleEmployment.be  This test is not yet approved or cleared by the [place] FDA and has been authorized for detection and/or diagnosis of SARS-CoV-2 by FDA under an Emergency Use Authorization (EUA). This EUA will remain in effect (meaning this test can be used) for the duration of the COVID-19 declaration under Section 564(b)(1) of the Act, 21 U.S.C. section 360bbb-3(b)(1), unless the authorization is terminated or revoked.  Performed at [facility], [address] [place], [place] [address]   Urine Culture     Status: Abnormal   Collection Time: 12/28/20  6:37 AM   Specimen: Urine, Clean Catch  Result Value Ref Range Status   Specimen Description   Final    URINE, CLEAN CATCH Performed at [facility], [address] [place], [place] [address]    Special Requests   Final    NONE Performed at [facility], [address] [place], [place] [address]     Culture MULTIPLE SPECIES PRESENT, SUGGEST RECOLLECTION (A)  Final   Report Status 12/29/2020 FINAL  Final  Culture, blood (routine x 2)     Status: Abnormal   Collection Time: 12/28/20  5:35 PM   Specimen: BLOOD  Result Value Ref Range Status   Specimen Description   Final    BLOOD RIGHT ANTECUBITAL Performed at [facility], [address] [place], [place] [address]    Special Requests   Final    BOTTLES DRAWN AEROBIC AND ANAEROBIC Blood Culture adequate volume Performed at [facility], [address], [place], [place] [address]    Culture  Setup Time   Final    GRAM POSITIVE COCCI IN BOTH AEROBIC AND ANAEROBIC BOTTLES Organism ID to follow CRITICAL RESULT CALLED TO, READ BACK BY AND VERIFIED WITH: BRANDON BEERS 12/29/20 1600 MU Performed at [facility], [address] [place], [place] [address]    Culture (A)  Final  STAPHYLOCOCCUS EPIDERMIDIS THE SIGNIFICANCE OF ISOLATING THIS ORGANISM FROM A SINGLE SET OF BLOOD CULTURES WHEN MULTIPLE SETS ARE DRAWN IS UNCERTAIN. PLEASE NOTIFY THE MICROBIOLOGY DEPARTMENT WITHIN ONE WEEK IF SPECIATION AND SENSITIVITIES ARE REQUIRED. Performed at [facility], [address] [place] [address] [place], [place] [address]    Report Status 12/31/2020 FINAL  Final  Blood Culture ID Panel (Reflexed)     Status: Abnormal   Collection Time: 12/28/20  5:35 PM  Result Value Ref Range Status   Enterococcus faecalis NOT DETECTED NOT DETECTED Final   Enterococcus Faecium NOT DETECTED NOT DETECTED Final   Listeria monocytogenes NOT DETECTED NOT DETECTED Final   Staphylococcus species DETECTED (A) NOT DETECTED Final    Comment: CRITICAL RESULT CALLED TO, READ BACK BY AND VERIFIED WITH: BRANDON BEERS 12/29/20 1600 MU    Staphylococcus aureus (BCID) NOT DETECTED NOT DETECTED Final   Staphylococcus epidermidis DETECTED (A) NOT DETECTED Final    Comment: Methicillin (oxacillin) resistant coagulase negative staphylococcus. Possible  blood culture contaminant (unless isolated from more than one blood culture draw or clinical case suggests pathogenicity). No antibiotic treatment is indicated for blood  culture contaminants. CRITICAL RESULT CALLED TO, READ BACK BY AND VERIFIED WITH: BRANDON BEERS 12/29/20 1600 MU    Staphylococcus lugdunensis NOT DETECTED NOT DETECTED Final   Streptococcus species NOT DETECTED NOT DETECTED Final   Streptococcus agalactiae NOT DETECTED NOT DETECTED Final   Streptococcus pneumoniae NOT DETECTED NOT DETECTED Final   Streptococcus pyogenes NOT DETECTED NOT DETECTED Final   A.calcoaceticus-baumannii NOT DETECTED NOT DETECTED Final   Bacteroides fragilis NOT DETECTED NOT DETECTED Final   Enterobacterales NOT DETECTED NOT DETECTED Final   Enterobacter cloacae complex NOT DETECTED NOT DETECTED Final   Escherichia coli NOT DETECTED NOT DETECTED Final   Klebsiella aerogenes NOT DETECTED NOT DETECTED Final   Klebsiella oxytoca NOT DETECTED NOT DETECTED Final   Klebsiella pneumoniae NOT DETECTED NOT DETECTED Final   Proteus species NOT DETECTED NOT DETECTED Final   Salmonella species NOT DETECTED NOT DETECTED Final   Serratia marcescens NOT DETECTED NOT DETECTED Final   Haemophilus influenzae NOT DETECTED NOT DETECTED Final   Neisseria meningitidis NOT DETECTED NOT DETECTED Final   Pseudomonas aeruginosa NOT DETECTED NOT DETECTED Final   Stenotrophomonas maltophilia NOT DETECTED NOT DETECTED Final   Candida albicans NOT DETECTED NOT DETECTED Final   Candida auris NOT DETECTED NOT DETECTED Final   Candida glabrata NOT DETECTED NOT DETECTED Final   Candida krusei NOT DETECTED NOT DETECTED Final   Candida parapsilosis NOT DETECTED NOT DETECTED Final   Candida tropicalis NOT DETECTED NOT DETECTED Final   Cryptococcus neoformans/gattii NOT DETECTED NOT DETECTED Final   Methicillin resistance mecA/C DETECTED (A) NOT DETECTED Final    Comment: CRITICAL RESULT CALLED TO, READ BACK BY AND VERIFIED  WITH: BRANDON BEERS 12/29/20 1600 MU Performed at [facility], [address] [place], [place] [address]   Culture, blood (Routine X 2) w Reflex to ID Panel     Status: None   Collection Time: 12/29/20 12:42 AM   Specimen: BLOOD  Result Value Ref Range Status   Specimen Description BLOOD LEFT ASSIST CONTROL  Final   Special Requests   Final    IN PEDIATRIC BOTTLE Blood Culture results may not be optimal due to an excessive volume of blood received in culture bottles   Culture   Final    NO GROWTH 6 DAYS Performed at [facility], [address], [place], [place] [address]  Report Status 01/04/2021 FINAL  Final      Radiology Studies: No results found.  Scheduled Meds:   Continuous Infusions:     LOS: 9 days   Time spent: 28 minutes. More than 50% of the time was spent in counseling/coordination of care  Kayleen Memos, MD [facility]  If 7PM-7AM, please contact night-coverage [email or web address]  01/06/2021, 11:23 AM   This record has been created using Systems analyst. Errors have been sought and corrected,but may not always be located. Such creation errors do not reflect on the standard of care.

## 2021-01-07 NOTE — Progress Notes (Signed)
[facility] Hospital Liaison Note   Hospice Home is able to offer a bed today with request for transport at 8 pm.   Family agreeable to transfer today. Dayton Scrape, LCSW [facility] Manager aware.   RN please call report to [facility] at [phone] prior to patient leaving the unit.  Please send signed and completed DNR with patient at discharge.   Please do not hesitate to call with any hospice related questions.    Thank you for the opportunity to participate in this patient's care.   Bobbie "Loren Racer, RN, BSN [facility] Liaison [phone]

## 2021-01-07 NOTE — Progress Notes (Signed)
Report called to hospice home

## 2021-01-07 NOTE — Progress Notes (Signed)
Discussed visitor restrictions with family. 4 at bedside at this time per policy. Orma Flaming, RN

## 2021-01-07 NOTE — Care Management Important Message (Signed)
Important Message  Patient Details  Name: [name] MRN: [id number] Date of Birth: [birth year]   Medicare Important Message Given:  Other (see comment)  Patient is on comfort care and approved for the Hospice Home. Out of respect for the patient and family no Important Message from [facility] given.    [name] A Hiroyuki Ozanich 01/07/2021, 8:33 AM

## 2021-01-07 NOTE — Discharge Summary (Signed)
Discharge Summary  [name] AQT:[id number] DOB: 04/05/1945  PCP: Birdie Sons, MD  Admit date: 12/27/2020 Discharge date: 01/07/2021  Time spent: 35 minutes  Recommendations for Outpatient Follow-up:  Continue with hospice care.  Discharge Diagnoses:  Active Hospital Problems   Diagnosis Date Noted   Atrial fibrillation with RVR ([place]) 12/27/2020   Comfort measures only status 01/01/2021   Suspected COVID-19 virus infection 12/27/2020   Elevated troponin 12/27/2020   Failure to thrive in adult 12/27/2020   CAD (coronary artery disease) 12/27/2020   Asthma 12/27/2020   Dementia ([place]) 12/27/2020   Depression 12/27/2020   CKD (chronic kidney disease), stage IV ([place]) 04/10/2019   Chronic HFrEF (heart failure with reduced ejection fraction) ([place]) 09/27/2017   Multiple myeloma not having achieved remission ([place]) 03/26/2016   Type 2 diabetes mellitus with stage 4 chronic kidney disease ([place]) 01/28/2009   Essential hypertension 08/31/2000    Resolved Hospital Problems  No resolved problems to display.    Discharge Condition: Stable  Diet recommendation: Pleasure feedings  Vitals:   01/04/21 0459 01/05/21 0522  BP:  (!) 145/89  Pulse:  (!) 110  Resp: 20 16  SpO2:  99%    History of present illness:  [name] is a 76 y.o. female with medical history significant of CKD-IV, hypertension, hyperlipidemia, diet-controlled diabetes, asthma, depression, pulmonary hypertension, CAD, aortic stenosis, dCHF, multiple myeloma, dementia, depression, who presents to [facility] ED from SNF with shortness of breath and failure to thrive. Patient with recent admission (12/08/2020-12/15/2020) with recurrent UTIs and associated urinary frequency, anorexia, and agitation superimposed on baseline dementia.  She was discharged to SNF for rehab.  At baseline she is wheelchair dependent but able to transfer herself from bed to wheelchair.    Per daughter since discharge patient is experiencing  worsening fatigue and refusing most of the care and p.o. intake.  For about 5 to 6 days she started refusing all of her medications.  She was tested positive for COVID-19 on 12/27/2020 first at her facility, later confirmed by PCR in ED.   She was also found to have new onset A. fib with RVR on arrival to ED. cardiology was consulted and she was converted to sinus rhythm this morning.  TSH levels pending as patient is refusing labs. CHA2DS2-VASc score of 6, she is currently on heparin infusion and will need NOAC before discharge but she should be able to take it regularly in order to get the benefit.   She was also found to have elevated troponin at 70.  Refusing further labs.  No chest pain.   Admitted for failure to thrive and new onset A. fib with RVR. Palliative care was consulted to assist with establishing goals of care.  Patient continued to refuse food and medicine.  Palliative care had discussions with the family and decision was made for comfort care and to discharge to residential hospice.    01/07/2021: Patient was seen at her bedside.  There were no acute events overnight.  She did not appear in distress.  She wants to be left alone.  Hospital Course:  Principal Problem:   Atrial fibrillation with RVR (HCC) Active Problems:   Type 2 diabetes mellitus with stage 4 chronic kidney disease (HCC)   Essential hypertension   Multiple myeloma not having achieved remission (HCC)   Chronic HFrEF (heart failure with reduced ejection fraction) (HCC)   CKD (chronic kidney disease), stage IV (HCC)   Suspected COVID-19 virus infection   Elevated  troponin   Failure to thrive in adult   CAD (coronary artery disease)   Asthma   Dementia (HCC)   Depression   Comfort measures only status  Assessment: -New onset A. fib with RVR.   -COVID-19 viral infection.   -Elevated troponin/history of CAD.  -Essential hypertension.   -Chronic HFrEF with normalization of EF.   -CKD stage IV.   -Multiple  myeloma not having achieved remission ([place]) -Failure to thrive   Failure to thrive.  Started after prior discharge to rehab.  History of losing her 58 years of spouse 2 months ago.  Depression might be playing a role.  We obtained psych consult and they do not think that she need any changes to her current medication.  Patient is refusing to take all the meds. Family decided to transition her to comfort care only.  Indwelling Foley catheter placed on 01/06/2021 at family's request at end-of-life care.   Procedures: None.  Consultations: Palliative care team.    Discharge Exam: BP (!) 145/89 (BP Location: Right Arm)    Pulse (!) 110    Temp 99.5 F (37.5 C) (Oral)    Resp 16    Ht 5' (1.524 m)    Wt 86 kg    SpO2 99%    BMI 37.03 kg/m  General: 76 y.o. year-old female frail-appearing in no acute distress.  She is alert.  Wants to be left alone. Cardiovascular: Regular rate and rhythm with no rubs or gallops.  No thyromegaly or JVD noted.   Respiratory: Clear to auscultation with no wheezes or rales. Good inspiratory effort. Abdomen: Soft nontender nondistended with normal bowel sounds x4 quadrants. Musculoskeletal: No lower extremity edema. 2/4 pulses in all 4 extremities. Psychiatry: Mood is appropriate for condition and setting  Discharge Instructions You were cared for by a hospitalist during your hospital stay. If you have any questions about your discharge medications or the care you received while you were in the hospital after you are discharged, you can call the unit and asked to speak with the hospitalist on call if the hospitalist that took care of you is not available. Once you are discharged, your primary care physician will handle any further medical issues. Please note that NO REFILLS for any discharge medications will be authorized once you are discharged, as it is imperative that you return to your primary care physician (or establish a relationship with a primary care  physician if you do not have one) for your aftercare needs so that they can reassess your need for medications and monitor your lab values.   Allergies as of 01/07/2021       Reactions   Hydrochlorothiazide Other (See Comments)   Leg Cramps   Penicillins Rash        Medication List     STOP taking these medications    albuterol 108 (90 Base) MCG/ACT inhaler Commonly known as: VENTOLIN HFA   amLODipine 5 MG tablet Commonly known as: NORVASC   cyanocobalamin 1000 MCG tablet   donepezil 5 MG tablet Commonly known as: ARICEPT   furosemide 20 MG tablet Commonly known as: LASIX   levalbuterol 1.25 MG/3ML nebulizer solution Commonly known as: XOPENEX   lisinopril 10 MG tablet Commonly known as: ZESTRIL   metoprolol tartrate 50 MG tablet Commonly known as: LOPRESSOR   mirtazapine 7.5 MG tablet Commonly known as: REMERON   nystatin powder Commonly known as: MYCOSTATIN/NYSTOP   oxybutynin 5 MG tablet Commonly known as: DITROPAN   polyethylene  glycol 17 g packet Commonly known as: MIRALAX / GLYCOLAX   QUEtiapine 25 MG tablet Commonly known as: SEROQUEL   senna-docusate 8.6-50 MG tablet Commonly known as: Senokot-S   traZODone 100 MG tablet Commonly known as: DESYREL   Vitamin D 50 MCG (2000 UT) tablet       Allergies  Allergen Reactions   Hydrochlorothiazide Other (See Comments)    Leg Cramps   Penicillins Rash      The results of significant diagnostics from this hospitalization (including imaging, microbiology, ancillary and laboratory) are listed below for reference.    Significant Diagnostic Studies: CT ABDOMEN PELVIS WO CONTRAST  Result Date: 12/08/2020 CLINICAL DATA:  Abdominal pain, acute, nonlocalized; being treated for UTI EXAM: CT ABDOMEN AND PELVIS WITHOUT CONTRAST TECHNIQUE: Multidetector CT imaging of the abdomen and pelvis was performed following the standard protocol without IV contrast. COMPARISON:  2018 FINDINGS: Lower chest: No  acute abnormality. Hepatobiliary: No focal liver abnormality is seen. No gallstones, gallbladder wall thickening, or biliary dilatation. Pancreas: Unremarkable. Spleen: Unremarkable. Adrenals/Urinary Tract: Adrenals are unremarkable. Mild nonspecific infiltration of the perinephric fat. No renal calculi. No hydronephrosis. Bladder is unremarkable. Stomach/Bowel: Stomach is within normal limits. Bowel is normal in caliber. Distal colonic diverticulosis. Normal appendix. Vascular/Lymphatic: Aortic atherosclerosis. No enlarged lymph nodes. Reproductive: Uterus and bilateral adnexa are unremarkable. Other: No ascites.  No significant abdominal wall abnormality. Musculoskeletal: Advanced degenerative changes of the included spine. Degenerative changes at the hips. IMPRESSION: No urinary tract calculi or hydronephrosis. Nonspecific mild perinephric stranding. Limited evaluation for pyelonephritis on this noncontrast study. Colonic diverticulosis.  Atherosclerosis. Electronically Signed   By: Macy Mis M.D.   On: 12/08/2020 12:52   DG Chest 2 View  Result Date: 12/27/2020 CLINICAL DATA:  COVID-19 positive with decreased oxygen saturation. EXAM: CHEST - 2 VIEW COMPARISON:  12/11/2020 FINDINGS: Lateral view degraded by patient arm position. Midline trachea. Mild cardiomegaly. Atherosclerosis in the transverse aorta. Right paratracheal soft tissue fullness has been grossly similar back to 2017 and is likely due to prominent great vessels. No pleural effusion or pneumothorax. No congestive failure. Low lung volumes with resultant pulmonary interstitial prominence. IMPRESSION: Cardiomegaly and low lung volumes, without acute disease. Electronically Signed   By: Abigail Miyamoto M.D.   On: 12/27/2020 13:58   CT HEAD WO CONTRAST (5MM)  Result Date: 12/08/2020 CLINICAL DATA:  76 year old female with history of altered mental status. EXAM: CT HEAD WITHOUT CONTRAST TECHNIQUE: Contiguous axial images were obtained from the  base of the skull through the vertex without intravenous contrast. COMPARISON:  No priors. FINDINGS: Brain: Mild cerebral atrophy. Patchy and confluent areas of decreased attenuation are noted throughout the deep and periventricular white matter of the cerebral hemispheres bilaterally, compatible with chronic microvascular ischemic disease. No evidence of acute infarction, hemorrhage, hydrocephalus, extra-axial collection or mass lesion/mass effect. Vascular: No hyperdense vessel or unexpected calcification. Skull: Normal. Negative for fracture or focal lesion. Sinuses/Orbits: No acute finding. Other: None. IMPRESSION: 1. No acute intracranial abnormalities. 2. Mild cerebral atrophy with severe chronic microvascular ischemic changes in the cerebral white matter, as above. Electronically Signed   By: Vinnie Langton M.D.   On: 12/08/2020 12:44   US RENAL  Result Date: 12/13/2020 CLINICAL DATA:  Pyelonephritis EXAM: RENAL / URINARY TRACT ULTRASOUND COMPLETE COMPARISON:  12/08/2020 FINDINGS: Right Kidney: Renal measurements: 11.0 x 5.9 x 6.0 cm = volume: 201 mL. Mild increased renal cortical echotexture. No hydronephrosis or solid renal mass. Simple appearing 1.1 cm cyst lower pole right kidney.  Left Kidney: Renal measurements: 10.3 x 5.3 x 4.4 cm = volume: 124 mL. Mild increased renal cortical echotexture. No hydronephrosis or renal mass. Bladder: Appears normal for degree of bladder distention. Other: None. IMPRESSION: 1. Mild increased bilateral renal cortical echotexture consistent with medical renal disease. Electronically Signed   By: Randa Ngo M.D.   On: 12/13/2020 15:30   DG Chest Port 1 View  Result Date: 12/11/2020 CLINICAL DATA:  Questionable sepsis EXAM: PORTABLE CHEST 1 VIEW COMPARISON:  07/14/2015 FINDINGS: Heart and mediastinal contours are within normal limits. No focal opacities or effusions. No acute bony abnormality. IMPRESSION: No active disease. Electronically Signed   By: Rolm Baptise M.D.   On: 12/11/2020 21:40    Microbiology: Recent Results (from the past 240 hour(s))  Culture, blood (routine x 2)     Status: Abnormal   Collection Time: 12/28/20  5:35 PM   Specimen: BLOOD  Result Value Ref Range Status   Specimen Description   Final    BLOOD RIGHT ANTECUBITAL Performed at [facility], [address], [place], [place] [address]    Special Requests   Final    BOTTLES DRAWN AEROBIC AND ANAEROBIC Blood Culture adequate volume Performed at [facility], [address], [place], [place] [address]    Culture  Setup Time   Final    GRAM POSITIVE COCCI IN BOTH AEROBIC AND ANAEROBIC BOTTLES Organism ID to follow CRITICAL RESULT CALLED TO, READ BACK BY AND VERIFIED WITH: BRANDON BEERS 12/29/20 1600 MU Performed at [facility], [address] [place], [place] [address]    Culture (A)  Final    STAPHYLOCOCCUS EPIDERMIDIS THE SIGNIFICANCE OF ISOLATING THIS ORGANISM FROM A SINGLE SET OF BLOOD CULTURES WHEN MULTIPLE SETS ARE DRAWN IS UNCERTAIN. PLEASE NOTIFY THE MICROBIOLOGY DEPARTMENT WITHIN ONE WEEK IF SPECIATION AND SENSITIVITIES ARE REQUIRED. Performed at [facility], [place] [address], [place], [place] [address]    Report Status 12/31/2020 FINAL  Final  Blood Culture ID Panel (Reflexed)     Status: Abnormal   Collection Time: 12/28/20  5:35 PM  Result Value Ref Range Status   Enterococcus faecalis NOT DETECTED NOT DETECTED Final   Enterococcus Faecium NOT DETECTED NOT DETECTED Final   Listeria monocytogenes NOT DETECTED NOT DETECTED Final   Staphylococcus species DETECTED (A) NOT DETECTED Final    Comment: CRITICAL RESULT CALLED TO, READ BACK BY AND VERIFIED WITH: BRANDON BEERS 12/29/20 1600 MU    Staphylococcus aureus (BCID) NOT DETECTED NOT DETECTED Final   Staphylococcus epidermidis DETECTED (A) NOT DETECTED Final    Comment: Methicillin (oxacillin) resistant coagulase negative staphylococcus. Possible blood culture  contaminant (unless isolated from more than one blood culture draw or clinical case suggests pathogenicity). No antibiotic treatment is indicated for blood  culture contaminants. CRITICAL RESULT CALLED TO, READ BACK BY AND VERIFIED WITH: BRANDON BEERS 12/29/20 1600 MU    Staphylococcus lugdunensis NOT DETECTED NOT DETECTED Final   Streptococcus species NOT DETECTED NOT DETECTED Final   Streptococcus agalactiae NOT DETECTED NOT DETECTED Final   Streptococcus pneumoniae NOT DETECTED NOT DETECTED Final   Streptococcus pyogenes NOT DETECTED NOT DETECTED Final   A.calcoaceticus-baumannii NOT DETECTED NOT DETECTED Final   Bacteroides fragilis NOT DETECTED NOT DETECTED Final   Enterobacterales NOT DETECTED NOT DETECTED Final   Enterobacter cloacae complex NOT DETECTED NOT DETECTED Final   Escherichia coli NOT DETECTED NOT DETECTED Final   Klebsiella aerogenes NOT DETECTED NOT DETECTED Final   Klebsiella oxytoca NOT  DETECTED NOT DETECTED Final   Klebsiella pneumoniae NOT DETECTED NOT DETECTED Final   Proteus species NOT DETECTED NOT DETECTED Final   Salmonella species NOT DETECTED NOT DETECTED Final   Serratia marcescens NOT DETECTED NOT DETECTED Final   Haemophilus influenzae NOT DETECTED NOT DETECTED Final   Neisseria meningitidis NOT DETECTED NOT DETECTED Final   Pseudomonas aeruginosa NOT DETECTED NOT DETECTED Final   Stenotrophomonas maltophilia NOT DETECTED NOT DETECTED Final   Candida albicans NOT DETECTED NOT DETECTED Final   Candida auris NOT DETECTED NOT DETECTED Final   Candida glabrata NOT DETECTED NOT DETECTED Final   Candida krusei NOT DETECTED NOT DETECTED Final   Candida parapsilosis NOT DETECTED NOT DETECTED Final   Candida tropicalis NOT DETECTED NOT DETECTED Final   Cryptococcus neoformans/gattii NOT DETECTED NOT DETECTED Final   Methicillin resistance mecA/C DETECTED (A) NOT DETECTED Final    Comment: CRITICAL RESULT CALLED TO, READ BACK BY AND VERIFIED WITH: BRANDON  BEERS 12/29/20 1600 MU Performed at [facility], [address] [place], [place] [address]   Culture, blood (Routine X 2) w Reflex to ID Panel     Status: None   Collection Time: 12/29/20 12:42 AM   Specimen: BLOOD  Result Value Ref Range Status   Specimen Description BLOOD LEFT ASSIST CONTROL  Final   Special Requests   Final    IN PEDIATRIC BOTTLE Blood Culture results may not be optimal due to an excessive volume of blood received in culture bottles   Culture   Final    NO GROWTH 6 DAYS Performed at [facility], [address] [place], [place] [address]    Report Status 01/04/2021 FINAL  Final     Labs: Basic Metabolic Panel: No results for input(s): NA, K, CL, CO2, GLUCOSE, BUN, CREATININE, CALCIUM, MG, PHOS in the last 168 hours. Liver Function Tests: No results for input(s): AST, ALT, ALKPHOS, BILITOT, PROT, ALBUMIN in the last 168 hours. No results for input(s): LIPASE, AMYLASE in the last 168 hours. No results for input(s): AMMONIA in the last 168 hours. CBC: No results for input(s): WBC, NEUTROABS, HGB, HCT, MCV, PLT in the last 168 hours. Cardiac Enzymes: No results for input(s): CKTOTAL, CKMB, CKMBINDEX, TROPONINI in the last 168 hours. BNP: BNP (last 3 results) Recent Labs    12/27/20 1313  BNP 128.9*    ProBNP (last 3 results) No results for input(s): PROBNP in the last 8760 hours.  CBG: No results for input(s): GLUCAP in the last 168 hours.     Signed:  Kayleen Memos, MD [facility] 01/07/2021, 10:28 AM

## 2021-01-07 NOTE — TOC Transition Note (Signed)
Transition of Care [facility]) - CM/SW Discharge Note   Patient Details  Name: [name] MRN: [id number] Date of Birth: 1945/05/25  Transition of Care [facility]) CM/SW Contact:  Candie Chroman, LCSW Phone Number: 01/07/2021, 2:53 PM   Clinical Narrative:   Patient has orders to discharge to [facility] today. RN will call report to [phone]. [facility] hospice liaison set up EMS transport for 8:00 pm. No further concerns. CSW signing off.  Final next level of care: [place] Barriers to Discharge: Barriers Resolved   Patient Goals and CMS Choice   CMS Medicare.gov Compare Post Acute Care list provided to:: Patient Represenative (must comment) (daughter Roma) Choice offered to / list presented to : Adult Children  Discharge Placement              Patient chooses bed at: Other - please specify in the comment section below: [facility]) Patient to be transferred to facility by: EMS   Patient and family notified of of transfer: 01/07/21  Discharge Plan and Services                                     Social Determinants of Health (SDOH) Interventions     Readmission Risk Interventions No flowsheet data found.

## 2021-01-10 ENCOUNTER — Ambulatory Visit: Payer: Medicare Other | Admitting: Family Medicine

## 2021-01-12 NOTE — Telephone Encounter (Signed)
Form is signed and left on my Workstation.

## 2021-01-12 NOTE — Telephone Encounter (Signed)
FMLA paperwork placed upfront for pick up. Tried calling patient's daughter. Left message to call back. OK for [facility] to advise that paperwork is ready of pick up.

## 2021-01-12 NOTE — Telephone Encounter (Signed)
Amorin, pt's daughter stated she will pick up [facility] paperwork tomorrow. Stated Ciani passed away.

## 2021-02-02 DEATH — deceased

## 2021-02-03 ENCOUNTER — Ambulatory Visit: Payer: Medicare Other | Admitting: Podiatry

## 2021-02-24 ENCOUNTER — Ambulatory Visit: Payer: Medicare Other | Admitting: Internal Medicine
